# Patient Record
Sex: Female | Born: 1950 | Race: White | Hispanic: No | Marital: Married | Smoking: Never smoker
Health system: Southern US, Community
[De-identification: ages and names within clinical notes are randomized; demographics above are authoritative.]

## PROBLEM LIST (undated history)

## (undated) ENCOUNTER — Emergency Department: Payer: BLUE CROSS/BLUE SHIELD | Source: Home / Self Care

## (undated) DIAGNOSIS — R42 Dizziness and giddiness: Secondary | ICD-10-CM

## (undated) DIAGNOSIS — I700 Atherosclerosis of aorta: Secondary | ICD-10-CM

## (undated) DIAGNOSIS — G4700 Insomnia, unspecified: Secondary | ICD-10-CM

## (undated) DIAGNOSIS — M1990 Unspecified osteoarthritis, unspecified site: Secondary | ICD-10-CM

## (undated) DIAGNOSIS — C4491 Basal cell carcinoma of skin, unspecified: Secondary | ICD-10-CM

## (undated) DIAGNOSIS — M25512 Pain in left shoulder: Secondary | ICD-10-CM

## (undated) DIAGNOSIS — E119 Type 2 diabetes mellitus without complications: Secondary | ICD-10-CM

## (undated) DIAGNOSIS — G44209 Tension-type headache, unspecified, not intractable: Secondary | ICD-10-CM

## (undated) DIAGNOSIS — R0600 Dyspnea, unspecified: Secondary | ICD-10-CM

## (undated) DIAGNOSIS — M5030 Other cervical disc degeneration, unspecified cervical region: Secondary | ICD-10-CM

## (undated) DIAGNOSIS — M51369 Other intervertebral disc degeneration, lumbar region without mention of lumbar back pain or lower extremity pain: Secondary | ICD-10-CM

## (undated) DIAGNOSIS — I5030 Unspecified diastolic (congestive) heart failure: Secondary | ICD-10-CM

## (undated) DIAGNOSIS — I447 Left bundle-branch block, unspecified: Secondary | ICD-10-CM

## (undated) DIAGNOSIS — G629 Polyneuropathy, unspecified: Secondary | ICD-10-CM

## (undated) DIAGNOSIS — Z22322 Carrier or suspected carrier of Methicillin resistant Staphylococcus aureus: Secondary | ICD-10-CM

## (undated) DIAGNOSIS — R519 Headache, unspecified: Secondary | ICD-10-CM

## (undated) DIAGNOSIS — K219 Gastro-esophageal reflux disease without esophagitis: Secondary | ICD-10-CM

## (undated) DIAGNOSIS — F32A Depression, unspecified: Secondary | ICD-10-CM

## (undated) DIAGNOSIS — Z8669 Personal history of other diseases of the nervous system and sense organs: Secondary | ICD-10-CM

## (undated) DIAGNOSIS — F419 Anxiety disorder, unspecified: Secondary | ICD-10-CM

## (undated) DIAGNOSIS — J329 Chronic sinusitis, unspecified: Secondary | ICD-10-CM

## (undated) DIAGNOSIS — I272 Pulmonary hypertension, unspecified: Secondary | ICD-10-CM

## (undated) DIAGNOSIS — R011 Cardiac murmur, unspecified: Secondary | ICD-10-CM

## (undated) DIAGNOSIS — E11319 Type 2 diabetes mellitus with unspecified diabetic retinopathy without macular edema: Secondary | ICD-10-CM

## (undated) DIAGNOSIS — Z9841 Cataract extraction status, right eye: Secondary | ICD-10-CM

## (undated) DIAGNOSIS — J302 Other seasonal allergic rhinitis: Secondary | ICD-10-CM

## (undated) DIAGNOSIS — Z860100 Personal history of colon polyps, unspecified: Secondary | ICD-10-CM

## (undated) DIAGNOSIS — E1140 Type 2 diabetes mellitus with diabetic neuropathy, unspecified: Secondary | ICD-10-CM

## (undated) DIAGNOSIS — I6789 Other cerebrovascular disease: Secondary | ICD-10-CM

## (undated) DIAGNOSIS — R0609 Other forms of dyspnea: Secondary | ICD-10-CM

## (undated) DIAGNOSIS — J189 Pneumonia, unspecified organism: Secondary | ICD-10-CM

## (undated) DIAGNOSIS — F329 Major depressive disorder, single episode, unspecified: Secondary | ICD-10-CM

## (undated) DIAGNOSIS — H9193 Unspecified hearing loss, bilateral: Secondary | ICD-10-CM

## (undated) DIAGNOSIS — I10 Essential (primary) hypertension: Secondary | ICD-10-CM

## (undated) DIAGNOSIS — D649 Anemia, unspecified: Secondary | ICD-10-CM

## (undated) DIAGNOSIS — G709 Myoneural disorder, unspecified: Secondary | ICD-10-CM

## (undated) DIAGNOSIS — I2510 Atherosclerotic heart disease of native coronary artery without angina pectoris: Secondary | ICD-10-CM

## (undated) DIAGNOSIS — I350 Nonrheumatic aortic (valve) stenosis: Secondary | ICD-10-CM

## (undated) DIAGNOSIS — G3184 Mild cognitive impairment, so stated: Secondary | ICD-10-CM

## (undated) DIAGNOSIS — C4490 Unspecified malignant neoplasm of skin, unspecified: Secondary | ICD-10-CM

## (undated) DIAGNOSIS — C4492 Squamous cell carcinoma of skin, unspecified: Secondary | ICD-10-CM

## (undated) DIAGNOSIS — R002 Palpitations: Secondary | ICD-10-CM

## (undated) DIAGNOSIS — E878 Other disorders of electrolyte and fluid balance, not elsewhere classified: Secondary | ICD-10-CM

## (undated) DIAGNOSIS — Z8719 Personal history of other diseases of the digestive system: Secondary | ICD-10-CM

## (undated) DIAGNOSIS — I480 Paroxysmal atrial fibrillation: Secondary | ICD-10-CM

## (undated) HISTORY — DX: Essential (primary) hypertension: I10

## (undated) HISTORY — DX: Unspecified malignant neoplasm of skin, unspecified: C44.90

## (undated) HISTORY — DX: Type 2 diabetes mellitus without complications: E11.9

## (undated) HISTORY — PX: EYE SURGERY: SHX253

## (undated) HISTORY — PX: MANDIBLE SURGERY: SHX707

## (undated) HISTORY — DX: Anemia, unspecified: D64.9

## (undated) HISTORY — PX: CARDIAC VALVE REPLACEMENT: SHX585

## (undated) HISTORY — DX: Gastro-esophageal reflux disease without esophagitis: K21.9

## (undated) HISTORY — PX: FOOT SURGERY: SHX648

## (undated) HISTORY — PX: CARDIAC CATHETERIZATION: SHX172

## (undated) HISTORY — DX: Personal history of colon polyps, unspecified: Z86.0100

## (undated) HISTORY — DX: Personal history of colonic polyps: Z86.010

## (undated) HISTORY — PX: COSMETIC SURGERY: SHX468

## (undated) HISTORY — PX: AUGMENTATION MAMMAPLASTY: SUR837

---
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
## 1998-06-12 DIAGNOSIS — E782 Mixed hyperlipidemia: Secondary | ICD-10-CM | POA: Insufficient documentation

## 1998-06-12 DIAGNOSIS — A6000 Herpesviral infection of urogenital system, unspecified: Secondary | ICD-10-CM | POA: Insufficient documentation

## 1998-06-12 DIAGNOSIS — I10 Essential (primary) hypertension: Secondary | ICD-10-CM | POA: Insufficient documentation

## 2003-06-13 DIAGNOSIS — K2270 Barrett's esophagus without dysplasia: Secondary | ICD-10-CM | POA: Insufficient documentation

## 2004-05-20 ENCOUNTER — Ambulatory Visit: Payer: Self-pay | Admitting: Unknown Physician Specialty

## 2004-06-29 ENCOUNTER — Ambulatory Visit: Payer: Self-pay | Admitting: Otolaryngology

## 2005-01-08 ENCOUNTER — Observation Stay: Payer: Self-pay | Admitting: Internal Medicine

## 2005-01-08 ENCOUNTER — Other Ambulatory Visit: Payer: Self-pay

## 2005-02-08 ENCOUNTER — Ambulatory Visit: Payer: Self-pay | Admitting: Family Medicine

## 2005-03-30 ENCOUNTER — Ambulatory Visit: Payer: Self-pay | Admitting: Unknown Physician Specialty

## 2005-04-25 ENCOUNTER — Ambulatory Visit: Payer: Self-pay | Admitting: Surgery

## 2005-06-12 HISTORY — PX: OTHER SURGICAL HISTORY: SHX169

## 2005-06-12 HISTORY — PX: CHOLECYSTECTOMY: SHX55

## 2006-02-28 ENCOUNTER — Ambulatory Visit: Payer: Self-pay | Admitting: Family Medicine

## 2006-04-16 ENCOUNTER — Other Ambulatory Visit: Payer: Self-pay

## 2006-04-20 ENCOUNTER — Ambulatory Visit: Payer: Self-pay | Admitting: Orthopedic Surgery

## 2006-05-08 ENCOUNTER — Encounter: Payer: Self-pay | Admitting: Orthopedic Surgery

## 2006-05-12 ENCOUNTER — Encounter: Payer: Self-pay | Admitting: Orthopedic Surgery

## 2006-06-12 DIAGNOSIS — IMO0002 Reserved for concepts with insufficient information to code with codable children: Secondary | ICD-10-CM | POA: Insufficient documentation

## 2006-06-12 DIAGNOSIS — E1165 Type 2 diabetes mellitus with hyperglycemia: Secondary | ICD-10-CM | POA: Insufficient documentation

## 2006-06-12 HISTORY — PX: OTHER SURGICAL HISTORY: SHX169

## 2007-01-03 ENCOUNTER — Other Ambulatory Visit: Payer: Self-pay

## 2007-01-03 ENCOUNTER — Ambulatory Visit: Payer: Self-pay | Admitting: Orthopedic Surgery

## 2007-01-17 ENCOUNTER — Ambulatory Visit: Payer: Self-pay | Admitting: Orthopedic Surgery

## 2007-05-22 ENCOUNTER — Ambulatory Visit: Payer: Self-pay | Admitting: Family Medicine

## 2007-07-04 ENCOUNTER — Ambulatory Visit: Payer: Self-pay | Admitting: Orthopedic Surgery

## 2007-07-11 ENCOUNTER — Ambulatory Visit: Payer: Self-pay | Admitting: Orthopedic Surgery

## 2008-01-06 ENCOUNTER — Ambulatory Visit: Payer: Self-pay | Admitting: Family Medicine

## 2008-05-14 ENCOUNTER — Ambulatory Visit: Payer: Self-pay | Admitting: Unknown Physician Specialty

## 2008-07-14 ENCOUNTER — Ambulatory Visit: Payer: Self-pay | Admitting: Otolaryngology

## 2009-06-08 ENCOUNTER — Ambulatory Visit: Payer: Self-pay | Admitting: Family Medicine

## 2010-10-11 HISTORY — PX: OTHER SURGICAL HISTORY: SHX169

## 2011-01-31 ENCOUNTER — Ambulatory Visit: Payer: Self-pay | Admitting: Unknown Physician Specialty

## 2011-02-23 ENCOUNTER — Ambulatory Visit: Payer: Self-pay | Admitting: Unknown Physician Specialty

## 2011-02-28 ENCOUNTER — Ambulatory Visit: Payer: Self-pay | Admitting: Internal Medicine

## 2011-06-21 ENCOUNTER — Ambulatory Visit: Payer: Self-pay | Admitting: Obstetrics and Gynecology

## 2012-10-26 ENCOUNTER — Ambulatory Visit: Payer: Self-pay

## 2012-11-09 ENCOUNTER — Other Ambulatory Visit: Payer: Self-pay | Admitting: Family Medicine

## 2012-11-09 LAB — CLOSTRIDIUM DIFFICILE BY PCR

## 2013-01-21 ENCOUNTER — Other Ambulatory Visit (HOSPITAL_COMMUNITY): Payer: Self-pay | Admitting: Family Medicine

## 2013-01-21 ENCOUNTER — Ambulatory Visit (HOSPITAL_COMMUNITY): Payer: 59

## 2013-01-21 ENCOUNTER — Ambulatory Visit (HOSPITAL_COMMUNITY)
Admission: RE | Admit: 2013-01-21 | Discharge: 2013-01-21 | Disposition: A | Discharge status: Home / Self Care | Payer: 59 | Source: Ambulatory Visit | Attending: Family Medicine | Admitting: Family Medicine

## 2013-01-21 DIAGNOSIS — R42 Dizziness and giddiness: Secondary | ICD-10-CM

## 2013-01-21 DIAGNOSIS — I5033 Acute on chronic diastolic (congestive) heart failure: Secondary | ICD-10-CM | POA: Insufficient documentation

## 2013-01-21 DIAGNOSIS — I5189 Other ill-defined heart diseases: Secondary | ICD-10-CM | POA: Insufficient documentation

## 2013-01-21 DIAGNOSIS — R55 Syncope and collapse: Secondary | ICD-10-CM | POA: Insufficient documentation

## 2013-01-21 DIAGNOSIS — I359 Nonrheumatic aortic valve disorder, unspecified: Secondary | ICD-10-CM

## 2013-01-21 DIAGNOSIS — I519 Heart disease, unspecified: Secondary | ICD-10-CM | POA: Insufficient documentation

## 2013-01-21 DIAGNOSIS — I350 Nonrheumatic aortic (valve) stenosis: Secondary | ICD-10-CM | POA: Insufficient documentation

## 2013-01-21 HISTORY — DX: Nonrheumatic aortic (valve) stenosis: I35.0

## 2013-01-21 NOTE — Progress Notes (Signed)
  Echocardiogram 2D Echocardiogram has been performed.  Katie Buckley 01/21/2013, 5:13 PM

## 2013-01-29 ENCOUNTER — Ambulatory Visit (HOSPITAL_COMMUNITY): Payer: 59

## 2013-03-12 HISTORY — PX: NASAL SINUS SURGERY: SHX719

## 2013-03-26 ENCOUNTER — Ambulatory Visit: Payer: Self-pay | Admitting: Otolaryngology

## 2013-03-27 ENCOUNTER — Ambulatory Visit: Payer: Self-pay | Admitting: Otolaryngology

## 2013-03-27 LAB — BASIC METABOLIC PANEL
Anion Gap: 8 (ref 7–16)
BUN: 14 mg/dL (ref 7–18)
Calcium, Total: 8.9 mg/dL (ref 8.5–10.1)
Chloride: 100 mmol/L (ref 98–107)
Creatinine: 0.73 mg/dL (ref 0.60–1.30)
EGFR (Non-African Amer.): >60
Glucose: 293 mg/dL — ABNORMAL HIGH (ref 65–99)
Potassium: 3.9 mmol/L (ref 3.5–5.1)
Sodium: 134 mmol/L — ABNORMAL LOW (ref 136–145)

## 2013-03-28 LAB — LIPID PANEL
HDL Cholesterol: 79 mg/dL — ABNORMAL HIGH (ref 40–60)
Ldl Cholesterol, Calc: 125 mg/dL — ABNORMAL HIGH (ref 0–100)
Triglycerides: 200 mg/dL (ref 0–200)

## 2013-03-31 LAB — WOUND CULTURE

## 2013-03-31 LAB — PATHOLOGY REPORT

## 2013-04-17 LAB — CULTURE, FUNGUS WITHOUT SMEAR

## 2013-04-29 ENCOUNTER — Inpatient Hospital Stay: Payer: Self-pay | Admitting: Otolaryngology

## 2013-04-29 ENCOUNTER — Ambulatory Visit: Payer: Self-pay | Admitting: Otolaryngology

## 2013-04-29 ENCOUNTER — Ambulatory Visit: Payer: Self-pay | Admitting: Family Medicine

## 2013-04-30 LAB — BASIC METABOLIC PANEL
Anion Gap: 6 — ABNORMAL LOW (ref 7–16)
Calcium, Total: 9 mg/dL (ref 8.5–10.1)
Creatinine: 0.63 mg/dL (ref 0.60–1.30)
EGFR (African American): >60
EGFR (Non-African Amer.): >60
Glucose: 250 mg/dL — ABNORMAL HIGH (ref 65–99)
Potassium: 3.9 mmol/L (ref 3.5–5.1)

## 2013-04-30 LAB — CBC WITH DIFFERENTIAL/PLATELET
Basophil #: 0 10*3/uL (ref 0.0–0.1)
Basophil %: 0.4 %
Eosinophil #: 0 10*3/uL (ref 0.0–0.7)
Eosinophil %: 0.1 %
HCT: 35.5 % (ref 35.0–47.0)
HGB: 12.2 g/dL (ref 12.0–16.0)
Lymphocyte #: 1.5 10*3/uL (ref 1.0–3.6)
MCH: 28.5 pg (ref 26.0–34.0)
Monocyte %: 2 %
Neutrophil #: 8.9 10*3/uL — ABNORMAL HIGH (ref 1.4–6.5)
Neutrophil %: 83.1 %
RBC: 4.29 10*6/uL (ref 3.80–5.20)
RDW: 13.2 % (ref 11.5–14.5)

## 2013-05-02 LAB — PATHOLOGY REPORT

## 2013-05-02 LAB — POTASSIUM: Potassium: 4.1 mmol/L (ref 3.5–5.1)

## 2013-05-02 LAB — SODIUM: Sodium: 137 mmol/L (ref 136–145)

## 2013-05-12 ENCOUNTER — Ambulatory Visit: Payer: Self-pay | Admitting: Family Medicine

## 2013-05-22 LAB — CULTURE, FUNGUS WITHOUT SMEAR

## 2013-08-20 LAB — HEPATIC FUNCTION PANEL
ALT: 31 U/L (ref 7–35)
AST: 30 U/L (ref 13–35)

## 2013-10-07 ENCOUNTER — Ambulatory Visit: Payer: Self-pay | Admitting: Otolaryngology

## 2014-02-08 ENCOUNTER — Emergency Department: Payer: Self-pay | Admitting: Internal Medicine

## 2014-02-08 LAB — HEPATIC FUNCTION PANEL A (ARMC)
Albumin: 4.1 g/dL (ref 3.4–5.0)
Alkaline Phosphatase: 102 U/L
Bilirubin, Direct: <0.1 mg/dL (ref 0.00–0.20)
Bilirubin,Total: 0.2 mg/dL (ref 0.2–1.0)
SGOT(AST): 31 U/L (ref 15–37)
SGPT (ALT): 41 U/L
Total Protein: 7.4 g/dL (ref 6.4–8.2)

## 2014-02-08 LAB — BASIC METABOLIC PANEL
ANION GAP: 8 (ref 7–16)
BUN: 13 mg/dL (ref 7–18)
CALCIUM: 9 mg/dL (ref 8.5–10.1)
CO2: 31 mmol/L (ref 21–32)
Chloride: 99 mmol/L (ref 98–107)
Creatinine: 0.87 mg/dL (ref 0.60–1.30)
EGFR (African American): >60
Glucose: 155 mg/dL — ABNORMAL HIGH (ref 65–99)
Osmolality: 279 (ref 275–301)
POTASSIUM: 3.7 mmol/L (ref 3.5–5.1)
Sodium: 138 mmol/L (ref 136–145)

## 2014-02-08 LAB — CBC
HCT: 40.7 % (ref 35.0–47.0)
HGB: 13.1 g/dL (ref 12.0–16.0)
MCH: 27.5 pg (ref 26.0–34.0)
MCHC: 32.2 g/dL (ref 32.0–36.0)
MCV: 86 fL (ref 80–100)
Platelet: 218 10*3/uL (ref 150–440)
RBC: 4.75 10*6/uL (ref 3.80–5.20)
RDW: 14 % (ref 11.5–14.5)
WBC: 8.1 10*3/uL (ref 3.6–11.0)

## 2014-02-08 LAB — TROPONIN I: Troponin-I: <0.02 ng/mL

## 2014-02-09 LAB — CLOSTRIDIUM DIFFICILE(ARMC)

## 2014-02-09 LAB — WBCS, STOOL

## 2014-02-09 LAB — OCCULT BLOOD X 1 CARD TO LAB, STOOL: Occult Blood, Feces: NEGATIVE

## 2014-02-11 LAB — STOOL CULTURE

## 2014-04-13 ENCOUNTER — Ambulatory Visit: Payer: Self-pay | Admitting: Family Medicine

## 2014-05-12 ENCOUNTER — Ambulatory Visit: Payer: Self-pay | Admitting: Family Medicine

## 2014-05-20 LAB — HM DIABETES EYE EXAM

## 2014-07-22 LAB — CBC AND DIFFERENTIAL
HEMATOCRIT: 39 % (ref 36–46)
Hemoglobin: 13 g/dL (ref 12.0–16.0)
Platelets: 194 10*3/uL (ref 150–399)
WBC: 5.7 10*3/mL

## 2014-07-22 LAB — LIPID PANEL
Cholesterol: 336 mg/dL — AB (ref 0–200)
HDL: 73 mg/dL — AB (ref 35–70)
LDL Cholesterol: 210 mg/dL
Triglycerides: 267 mg/dL — AB (ref 40–160)

## 2014-07-22 LAB — BASIC METABOLIC PANEL
BUN: 13 mg/dL (ref 4–21)
CREATININE: 0.6 mg/dL (ref 0.5–1.1)
GLUCOSE: 152 mg/dL
POTASSIUM: 3.9 mmol/L (ref 3.4–5.3)
Sodium: 140 mmol/L (ref 137–147)

## 2014-07-22 LAB — TSH: TSH: 1.56 u[IU]/mL (ref 0.41–5.90)

## 2014-07-22 LAB — HEMOGLOBIN A1C: HEMOGLOBIN A1C: 8.1 % — AB (ref 4.0–6.0)

## 2014-10-02 NOTE — Discharge Summary (Signed)
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  OPERATIONS: On 05/01/2013, revision endoscopic sinus surgery.   [place] COURSE: The patient was admitted with severe headaches and an outpatient CT scan that showed opacification of the left frontal sinus and mucoperiosteal thickening involving the other paranasal sinuses. She was immediately started on IV Decadron and IV Unasyn. Consultation from the [facility] service was obtained, and they were very helpful with control of her diabetes as well as other medical issues while she was on the IV Decadron. The patient improved over the next 2 days very slowly and was taken to the operating room on November 20. She responded very well to that, and was ready for discharge and resumption of outpatient oral antibiotics and oral steroids. She will have followup in Dr. Ainsley Spinner clinic in 10 days, sooner if necessary.    ____________________________ Lenna Sciara. Nadeen Landau, MD jmc:lb D: 05/02/2013 07:55:32 ET T: 05/02/2013 08:13:34 ET JOB#: [id number]  cc: Janalee Dane, MD, <Dictator> [place] MD ELECTRONICALLY SIGNED 05/06/2013 10:58

## 2014-10-02 NOTE — H&P (Signed)
PATIENT NAME:  Katie Buckley, Katie Buckley MR#:  [id number] DATE OF BIRTH:  Oct 18, 1950  DATE OF ADMISSION:  04/29/2013  ADMITTING PHYSICIAN:  Nadeen Landau, MD  HISTORY OF PRESENT ILLNESS: The patient is a 64 year old white female who had endoscopic sinus surgery for severe acute sinusitis a month ago. She was noted, at the time, to have significant purulence, especially on left-sided purulence and pathologically had positive eosinophils. She did well for about 2 weeks, but over the past 2 weeks has developed worsening headaches, fatigue and is experiencing diarrhea from the antibiotics. Notably, she has a history of a nasal dorsal implant. This was not removed at the time of the surgery 1 month ago, although it was considered. The patient denies visual changes or double vision. She had a CT scan today which shows an opacification of the left frontal sinus. She developed low-grade fever starting this afternoon; therefore, I elected to admit the patient for IV antibiotics and IV steroids. She has failed oral antibiotics including oral ciprofloxacin and Septra DS.   ALLERGIES: BIAXIN (BAD TASTE IN THE MOUTH AND GI DISTRESS).   PAST SURGICAL HISTORY: Nasal surgery Baldo [place]).  Endoscopic sinus surgery on 03/29/2013.   PAST MEDICAL HISTORY:  Chronic sinusitis, skin cancer, gastroesophageal reflux disease, coronary artery disease, hypertension, diabetes.   MEDICATIONS:  Singulair, Allegra, Septra DS, Flonase, Diovan.   PHYSICAL EXAMINATION: GENERAL: The patient is in no acute distress.  EYES: Pupils are equal, round and reactive to light. Extraocular movements are intact.  NOSE: Dorsal implant intact. No erythema or pain with movement of it. No crusting or purulence intranasally. Oral cavity and oropharynx clear.  NECK: Trachea is in the midline.  EARS: External auditory canals are clear. Tympanic membranes are normal.  LUNGS: Clear to auscultation.  HEART: Regular rate and rhythm.  IMPRESSION:  1.  Acute  exacerbation of chronic pansinusitis. I have recommended IV antibiotics and IV steroids if the patient is not improved clinically by tomorrow, then we will proceed with revision frontal sinusotomy.  I have discussed all this in detail with the patient and her husband. She will be n.p.o. after midnight tonight.  2.  Diabetes and medical management I greatly appreciate the help of Dr. Verdell Carmine with her medical management.     ____________________________ Lenna Sciara. Nadeen Landau, MD jmc:dmm D: 04/29/2013 20:59:48 ET T: 04/29/2013 21:32:01 ET JOB#: [id number]  cc: Janalee Dane, MD, <Dictator> [place] MD ELECTRONICALLY SIGNED 05/01/2013 10:52

## 2014-10-02 NOTE — Consult Note (Signed)
Brief Consult Note: Diagnosis: 1. Acute on Chronic Sinusitis 2. HTN 3. DM 4. GERD 5. Barret's Esophogus 6. Chronic anemia.   Patient was seen by consultant.   Consult note dictated.   Recommend to proceed with surgery or procedure.   Orders entered.   Discussed with Attending MD.   Comments: 63 yo female w/ hx of HTN, DM, Obesity, GERD, Chronic anemia, hx of Barret's Esophagus, recent sinus surgery for acute on chronic sinusitis comes in w/ facial pressure, pain and having failed outpatient therapy for acute sinusitis w/ abx/steriods. Pt. to get sinus surgery tomorrow a.m.   1. Acute on Chronic sinusitis - failed outpatient therapy w/ steroids, abx.  - cont. care as per ENT. going to OR tomorrow.   2. DM - cont. Metformin, Glipizide.  - will add SSI coverage.  If sugars still labile will add some low dose Levemir.   3. HTN - cont. Metoprolol, Diovan, HCTZ  4. Hyperlipidemia - cont. Lipitor.   5. GERD - cont. Nexium.   6. Depression - cont. Brintellix.   Thanks for the consult and will follow with you.  Job # [id number].  Electronic Signatures: Henreitta Leber (MD)  (Signed [phone] 18:02)  Authored: Brief Consult Note   Last Updated: 18-Nov-14 18:02 by Henreitta Leber (MD)

## 2014-10-02 NOTE — Discharge Summary (Signed)
PATIENT NAME:  Katie Buckley, Katie Buckley MR#:  [id number] DATE OF BIRTH:  1950/12/28  REASON FOR ADMISSION: Urgent sinus surgery.   OPERATIONS AND INTERVENTIONS: Sinus surgery on 03/27/2013.   [place] COURSE: The patient was admitted for emergent sinus surgery (progression of sinusitis to periorbital cellulitis). The patient underwent endoscopic sinus surgery without complication and was admitted for control of diabetes and IV antibiotics.   The patient did well overnight. Her blood sugars were controlled with the help of the [facility] service. The following morning, she was without any periorbital inflammation or erythema. She completed 24 hours of IV Unasyn and was deemed ready for discharge on oral antibiotics.   FOLLOWUP: The patient was scheduled for follow-up in Dr. Ains[facility] Spinner clinic on Wednesday for debridement. Her oral antibiotic (Augmentin) was prescribed to the [place].    ____________________________ Lenna Sciara. Nadeen Landau, MD jmc:np D: 03/28/2013 16:58:00 ET T: 03/28/2013 17:21:28 ET JOB#: [id number]  cc: Janalee Dane, MD, <Dictator> Nicholos Johns MD ELECTRONICALLY SIGNED 04/10/2013 20:38

## 2014-10-02 NOTE — Consult Note (Signed)
PATIENT NAME:  Katie Buckley, Katie Buckley MR#:  [id number] DATE OF BIRTH:  06/26/1950  DATE OF CONSULTATION:  04/29/2013  REFERRING PHYSICIAN: Nadeen Landau, M.D.  CONSULTING PHYSICIAN:  Belia Heman. Verdell Carmine, MD PRIMARY CARE PHYSICIAN: Lelon Huh, M.D.   REASON FOR CONSULTATION: Medical management and management of diabetes.   HISTORY OF PRESENT ILLNESS: This is a 64 year old female who presents to the hospital due to fascial pressure and sinus pressure along with some blurry vision. The patient has a history of sinusitis and has had sinus surgery about a month ago. She is having a bout of acute-on-chronic sinusitis which is not improving with oral antibiotics and a prednisone taper. She is being admitted to the ENT service for possible recurrent sinusitis and having sinus surgery tomorrow. The patient has been placed on high-dose steroids along with IV antibiotics. Hospitalist services were contacted for diabetes management and also for management of her chronic medical problems.   The patient presently just complains of some facial pressure and some sinus pain but no postnasal drip. She says her eyes feel heavy no matter how much rest she gets. She denies any chest pain, shortness of breath, any nausea, vomiting, abdominal pain, fevers, chills, or any other associated symptoms presently.   REVIEW OF SYSTEMS:  CONSTITUTIONAL: No documented fever. No weight gain or weight loss.  EYES: No blurred or double vision.  ENT: No tinnitus. No postnasal drip. No redness of the oropharynx.  RESPIRATORY: No cough, no wheeze, no hemoptysis, no dyspnea.  CARDIOVASCULAR: No chest pain, no orthopnea, no palpitations, no syncope.  GASTROINTESTINAL: No nausea, no vomiting, or diarrhea. No abdominal pain. No melena or hematochezia.  GENITOURINARY: No dysuria and hematuria.  ENDOCRINE: No polyuria or nocturia, heat or cold intolerance.  HEMATOLOGIC: No anemia, no bruising, no bleeding.  INTEGUMENTARY: No rashes or  lesions.  MUSCULOSKELETAL: No arthritis, no swelling, no gout.  NEUROLOGIC: No numbness or tingling. No ataxia. No seizure activity.  PSYCHIATRIC: The patient has insomnia.   PAST MEDICAL HISTORY: Consistent with diabetes, hypertension, hyperlipidemia, anxiety, insomnia, GERD, Barrett esophagus.   PAST SURGICAL HISTORY: Consistent with jaw surgery, carpal tunnel release surgery, breast implants, laparoscopic cholecystectomy.   SOCIAL HISTORY: No smoking. No alcohol abuse. No illicit drug abuse. Lives at home with her husband.   FAMILY HISTORY: The patient's father is deceased, died from lung cancer. Her mother has diabetes, is alive.   CURRENT MEDICATIONS: As follows: Nexium 40 mg b.i.d., Prempro 0.625/2.5 mg daily, Nuvigil 250 mg daily, Brintellix 20 mg daily, metoprolol hydrochlorothiazide 100/25, half a tab b.i.d., Diovan hydrochlorothiazide 325/25 one tab daily, glipizide 5 mg daily, metformin 1000 mg b.i.d., Lipitor 40 mg daily, Klonopin 1 mg b.i.d. as needed and Ambien 5 mg at bedtime as needed.   PHYSICAL EXAMINATION: Presently is as follows:  VITAL SIGNS: Temperature 98.4, pulse 116, respirations 18, blood pressure 137/77, sats 96% on room air.  GENERAL: A pleasant-appearing female in no apparent distress.  HEENT: Atraumatic, normocephalic. Extraocular muscles are intact. Pupils are equal and reactive eye to light. Sclerae anicteric. No conjunctival injection. No pharyngeal erythema.  NECK: Supple. There is no jugular venous distention. No bruits, no lymphadenopathy, no thyromegaly.  HEART: Regular rate and rhythm. She does have a 2/6 systolic ejection murmur heard at the right sternal border. No rubs, no clicks.  LUNGS: Clear to auscultation bilaterally. No rales, no rhonchi, no wheezes.  ABDOMEN: Soft, flat, nontender, nondistended. Has good bowel sounds. No hepatosplenomegaly appreciated.  EXTREMITIES: No evidence of any cyanosis, clubbing,  or peripheral edema. Has +2 pedal and  radial pulses bilaterally.  NEUROLOGIC: Alert, awake, and oriented x3 with no focal motor or sensory deficits appreciated bilaterally.  SKIN: Moist and warm with no rashes appreciated.  LYMPHATIC: There is no cervical lymphadenopathy.   LABORATORY, DIAGNOSTIC AND RADIOLOGIC DATA: Currently pending.   ASSESSMENT AND PLAN: This is a 64 year old female with a history of hypertension, diabetes, obesity, gastroesophageal reflux disease, chronic anemia, history of Barrett esophagus, recent sinus surgery for acute-on-chronic sinusitis, who presents to the hospital due to facial pressure and pain, also having some heaviness in her eyes and having failed outpatient therapy for acute sinusitis with antibiotics and steroids.   1.  Acute-on-chronic sinusitis. The patient has failed outpatient therapy with oral steroids and antibiotics. She has been admitted and started on IV Decadron, IV Unasyn. The patient is likely to go to the operating room tomorrow as per Dr. Nadeen Landau. Continue care as per ENT.   2.  Diabetes. The patient is going to be on high-dose steroids; therefore, I will continue her metformin and glipizide. I will add sliding-scale insulin coverage, follow her blood sugars. If her sugars are still quite labile despite on these medications, I will consider adding some low-dose Levemir.   3.  Hypertension. I will continue the patient's metoprolol, Diovan, hydrochlorothiazide.   4.  Hyperlipidemia. Continue Lipitor.   5.  Gastroesophageal reflux disease. Continue Nexium.   6.  Depression. Continue Brintellix.   Thank you so much for the consultation. Will follow along with you.   TIME SPENT ON THE CONSULT: 45 minutes.    ____________________________ Belia Heman. Verdell Carmine, MD vjs:np D: 04/29/2013 18:02:00 ET T: 04/29/2013 18:32:13 ET JOB#: [id number]  cc: Belia Heman. Verdell Carmine, MD, <Dictator> Henreitta Leber MD ELECTRONICALLY SIGNED 04/30/2013 13:49

## 2014-10-02 NOTE — Op Note (Signed)
PATIENT NAME:  BEV, DRENNEN MR#:  [id number] DATE OF BIRTH:  February 03, 1951  DATE OF PROCEDURE:  05/01/2013  PREOPERATIVE DIAGNOSIS: Chronic pansinusitis with left frontal opacification.   POSTOPERATIVE DIAGNOSIS: Left frontal opacification; partial stenosis of right maxillary, right frontal, right sphenoid sinuses.   PROCEDURES: 1.  Image-guided sinus surgery (Stryker navigation).  2.  Revision bilateral frontal sinusotomies with tissue removal.  3.  Revision right sphenoidectomy with tissue removal.  4.  Revision right maxillary sinus antrostomy with tissue removal.  5.  Balloon dilation bilateral frontal sinuses.  6.  Rigid bronchoscopy.   SURGEON: Janalee Dane, MD   DESCRIPTION OF PROCEDURE: The patient was identified in the holding area, brought back to the operating room and placed in the supine position on the operating room table. After general endotracheal anesthesia had been induced, the patient was turned 90 degrees counterclockwise from anesthesia and placed in a beach chair position. The nose was anesthetized with a left infraorbital nerve block and phenylephrine-lidocaine soaked pledgets, 2 on each side.   The image-guided sinus surgery system mask was attached and registration was carried out in the standard fashion using the appropriate fiduciary points. Calibration of the system was confirmed and extensive review of the CT scan in all three dimensions preoperatively and intraoperatively was carried out.  Each instrument was registered and confirmed for anatomic accuracy.  The left side was addressed first. The left middle turbinate had become scarred at its anterior superior tip. This was divided and the middle turbinate was further medialized. There were a couple of small ledges of ethmoid, which were taken down with through-cutting forceps. The ethmoid, maxillary, and sphenoid sinuses were copiously irrigated. A 45 degree endoscope was then used to examine the frontal  recess and using a probe, the frontal recess was meticulously opened. There was a large volume of mucopus that was suctioned partially and sampled with a micro swab and then suctioned completely. There was an additional cell more lateral to the initial cell. This was very difficult due to definitively identify even with the image guidance. Therefore, the Balloon Sinuplasty instrumentation was used, and the fiberoptic light was passed through a small opening into the frontal sinus. This was progressively enlarged using the balloon dilation technique and then progressively enlarged using frontal sinus instrumentation. Once this had been adequately and meticulously opened, attention was directed to the right side, where again the middle turbinate was medialized. It was primarily lateralized posteriorly. This was of course divided, and the posterior aspect of the maxillary sinus antrostomy had become contracted. This was opened maximally, and attention was directed to the sphenoid sinus. This had also become very contracted, and this was enlarged using Kerrison rongeurs. The maxillary, ethmoid, and sphenoid sinuses were then copiously irrigated. The frontal sinus, which appeared to be more open superiorly than inferiorly, was balloon dilated and actually, the inferior more contracted opening was the main drainage pathway to the frontal recess. This was balloon dilated, and then the intersinus septum was taken down with through-cutting forceps on the right side. The frontal sinuses were then again copiously irrigated. An Intersect Propel drug-eluting stent was placed on each side to help medialize the middle turbinate. 0.5 unit of Surgiflo was placed. At this point, the endotracheal tube was removed, and a pediatric bronchoscope was used because the anesthesiologist had had a difficult time passing the endotracheal tube past the larynx and felt like there was subglottic narrowing. The pediatric bronchoscope was used to  visualize the subglottis, and  there was a small wedge of fresh scar tissue in the posterior subglottis. The rest of the trachea down to the carina was clear. No other mucosal abnormalities. Based on this finding, serologies for Wegener's granulomatosis and sarcoidosis were sent. The pediatric bronchoscope was removed and replaced with an LMA. The patient was then returned to anesthesia, allowed to emerge from anesthesia in the operating room, and taken to the recovery room in stable condition. There were no complications.   ESTIMATED BLOOD LOSS: 15 mL.   ____________________________ J. Nadeen Landau, MD jmc:jcm D: 05/01/2013 18:42:48 ET T: 05/01/2013 23:26:56 ET JOB#: [id number]  cc: Janalee Dane, MD, <Dictator> Nicholos Johns MD ELECTRONICALLY SIGNED 05/06/2013 10:58

## 2014-10-02 NOTE — Op Note (Signed)
PATIENT NAME:  Katie Buckley, Katie Buckley MR#:  [id number] DATE OF BIRTH:  1951-01-11  DATE OF PROCEDURE:  03/27/2013  SURGEON:  Janalee Dane, M.D.   PREOPERATIVE DIAGNOSIS:  Chronic sinusitis with progression to orbital cellulitis and unrelenting headache despite oral antibiotics and oral steroids.   POSTOPERATIVE DIAGNOSIS:  Chronic sinusitis with progression to orbital cellulitis and unrelenting headache despite oral antibiotics and oral steroids.   PROCEDURES: 1.  Image-guided sinus surgery (Stryker navigation system).  2.  Bilateral frontal sinusotomies with tissue removal.  3.  Bilateral anterior and posterior ethmoidectomies with tissue removal.  4.  Bilateral sphenoidectomies with tissue removal.  5.  Bilateral maxillary sinus antrostomies with tissue removal.   ANESTHESIA: General endotracheal.   DESCRIPTION OF PROCEDURE:  The image-guided sinus surgery system mask was attached and registration was carried out in the standard fashion using the appropriate fiduciary points. Calibration of the system was confirmed and extensive review of the CT scan in all three dimensions preoperatively and intraoperatively was carried out.  Each instrument was registered and confirmed for anatomic accuracy.  FINDINGS: The frontal sinus on left was significantly infected with purulent mucopus suctioned from the frontal recess. The ethmoid on the left posteriorly, there was a cell corresponding to the opacification posteriorly on the left that there was pus under pressure, which was relieved with opening the cell. There was also significant purulence on the right side, primarily in the ethmoid. There was profuse mucosal edema and thickening throughout the paranasal sinuses bilaterally. The concha bullosa was taken down from laterally bilaterally, and the medial aspect of the middle turbinates were medialized.   ESTIMATED BLOOD LOSS:  25 mL.   DESCRIPTION OF PROCEDURE:  The image-guided sinus surgery system  mask was attached and registration was carried out in the standard fashion using the appropriate fiduciary points. Calibration of the system was confirmed and extensive review preoperatively and intraoperatively was carried out.   Attention was directed to the sinus surgery portion of the surgery. The left side was addressed first. The middle turbinate was preserved. The uncinate process was then identified and completely resected revealing the natural maxillary sinus ostium. The natural maxillary sinus ostium was progressively enlarged by removing tissue to create a large maxillary antrostomy, removing diseased tissue. The maxillary antrum was then irrigated copiously with saline. Attention was directed to the ethmoid sinuses where bone and mucosal tissue from the ethmoid sinus was taken down from anterior to posterior preserving the skull base and lamina papyracea, removing diseased tissue. After the ethmoid sinuses had been completely cleaned out of polypoid chronically inflamed mucosa preserving the mucosa on the medial side of the middle turbinate along the skull base and along the lamina papyracea, attention was directed to the sphenoid recess. The sphenoid sinus ostium was identified and progressively enlarged with Kerrison Rongeur, staying inferior and medial in orientation, to create a large sphenoid sinus antrostomy, removing diseased tissue. The 30 degree scope was then used to explore the frontal recess. The frontal recess was progressively enlarged meticulously, removing diseased tissue, and the frontal sinus was then copiously irrigated with saline as was the sphenoid sinus. Attention was then directed to the right side where an identical series of procedures was accomplished. Upon completion of the sinus surgery, the Surgiflo was placed. A total of 1 units of Surgiflo was used.  Temporary Telfa pledgets were placed and tied over the columella to prevent dislodging.   The patient was then  returned to anesthesia, allowed to emerge from anesthesia  in the operating room, extubated, and taken to the recovery room in stable condition.    ____________________________ Lenna Sciara. Nadeen Landau, MD jmc:dmm D: 03/27/2013 19:04:01 ET T: 03/27/2013 22:49:39 ET JOB#: [id number]  cc: Janalee Dane, MD, <Dictator> Nicholos Johns MD ELECTRONICALLY SIGNED 04/10/2013 20:38

## 2014-10-02 NOTE — Consult Note (Signed)
PATIENT NAME:  Katie Buckley, Katie Buckley MR#:  [id number] DATE OF BIRTH:  1950/12/09  DATE OF CONSULTATION:  03/27/2013  REFERRING PHYSICIAN:  Nadeen Landau, MD CONSULTING PHYSICIAN:  Vivien Presto, MD  PRIMARY CARE PHYSICIAN: Kirstie Peri. Caryn Section, MD  REASON FOR CONSULTATION: Diabetes and medical management.   HISTORY OF PRESENT ILLNESS: The patient is a pleasant 64 year old Caucasian female with history of chronic sinusitis, hypertension, diabetes, hyperlipidemia, obesity, GERD. She has been having issues with her sinuses. The patient has had a couple of sinus surgeries in the past and is under the care of Dr. Nadeen Landau as an outpatient for sinus infection. She has had 14 days of Septra and also some cefdinir without improvement. The patient was sent to our CT scan, which was done yesterday showing sinusitis.   She was taken to the OR today for sinus surgery and did have a significant amount of pus drained from her sinuses. The patient also has a dorsal nasal implant, which was not touched during the surgery. The patient has been having low-grade fevers. The highest was 99.7 as an outpatient. Currently, she is in her bed. Medicine was consulted for help with medical management and help with her diabetes, as sugars have been running high. She states that her last hemoglobin A1c might have been in the 11s, but she is not sure.   PAST MEDICAL HISTORY: Diabetes, obesity, anemia, GERD, Barrett's esophagus, hypertension, heart murmur.   PAST SURGICAL HISTORY: Two nose surgeries, jaw surgery, carpal tunnel release surgery, breast implants multiple years ago, gallbladder surgery.   SOCIAL HISTORY: No tobacco, alcohol or drug use.   FAMILY HISTORY: Hypertension and diabetes run in her family.   OUTPATIENT MEDICATIONS: She does not know most of her dosages, but she knows she is on Nexium 40 mg 2 times a day, Prempro unknown dose, glipizide unknown dose, metformin 1000 mg 2 times a day, Lopressor unknown  dose, Ambien unknown dose, Diovan unknown dose, Singulair unknown dose. She is on Septra as well, Allegra and some prednisone.   ALLERGIES: BIAXIN CAUSING SOME GI UPSET.   REVIEW OF SYSTEMS:  CONSTITUTIONAL: Positive for low-grade fevers and 4-pound weight loss since her symptoms started. No headaches.  EYES: No blurry vision.  ENT: The patient had sinus pain before surgery drainage and some postnasal drip. The patient has a history of nasal surgeries and sinusitis chronically in the past. Currently, she has some pain in the sinuses.  CARDIOVASCULAR: She denies having any pains in the chest or palpitations. She has hypertension and heart murmur.  RESPIRATORY: The patient has occasional productive cough, yellowish sputum. No shortness of breath or wheezing.  ABDOMEN: The patient has had no nausea or vomiting, no abdominal pain, but has had somewhat loose stools since initiation of her antibiotics. She had 2 bowel movements yesterday, watery, brownish. No loose stools today. No bloody stools.  GENITOURINARY: Denies dysuria, hematuria.  HEMATOLOGIC, LYMPHATIC: The patient has a history of anemia. No easy bruising.  SKIN: No rashes.  MUSCULOSKELETAL: Denies arthritis or gout.  NEUROLOGIC: No focal weakness or numbness.  PSYCHIATRIC: The patient has insomnia.   PHYSICAL EXAMINATION:  VITAL SIGNS: Temperature on arrival 97.5 postsurgery, pulse 91, respiratory rate 18, blood pressure 134/70, O2 saturation 93% on 2 liters.  GENERAL: An obese female lying in bed, in no obvious distress.  HEENT: Normocephalic, atraumatic. The patient does have nasal packing and bandages on.  Pupils are equal, somewhat dry mucous membranes.  NECK: Supple. No thyroid tenderness. No cervical lymphadenopathy.  CARDIOVASCULAR: S1, S2 regularly irregular. The patient does have a systolic ejection murmur, 3/6. No rubs or gallops.  LUNGS: Clear to auscultation. No wheezing, rhonchi or rales.  ABDOMEN: Soft, nontender,  nondistended. Positive bowel sounds in all quadrants.  EXTREMITIES: No significant lower extremity edema.  SKIN: No obvious rashes.  NEUROLOGIC: Cranial nerves appear to be intact, II to XII. Strength is 5/5, all extremities. Sensation is intact to light touch.  PSYCHIATRIC: Awake, alert, oriented.   LABORATORY DATA: The patient has a CT of the sinuses without contrast from October 15 showing sinusitis. The patient's blood sugars today - initially 160, last one 260, but no other labs.   ASSESSMENT AND PLAN: We have a 64 year old female with chronic sinusitis, history of nasal surgery in the past, obesity, gastroesophageal reflux disease, Barrett's esophagus, diabetes, hypertension, post sinus surgery currently for chronic sinusitis, not improved on a couple rounds of antibiotics, and medicine was consulted for help with medical management.   At this point, we would go ahead and get her full medication reconciliation as she does not know most of her medications. Will attempt to call her pharmacy and figure out the dosages.   In regard to her diabetes, will check a hemoglobin A1c, start her on sliding scale insulin, start a low-dose glipizide for now. If her hemoglobin A1c is high, I would consider starting her on insulin, as I believe if she is going to be on prednisone and steroids here, that is going to jack up her sugars and if they are already high, they would not likely respond to orals.   In regard to her blood pressure medications, I would start some Lopressor, hold Diovan for now and see what the GFR is and consider resuming it later today. Her blood pressure is acceptable currently. I will check a lipid profile, as she is diabetic and hypertensive. She thinks she might be on a statin, but she is not sure. She should be on a statin. I would continue her PPI b.i.d. for her Barrett's esophagus and GERD. She is currently on Unasyn per ENT for her sinusitis, as well as some bacitracin ointment. She  is also on Nasacort. Management of sinusitis per ENT.   She has received 4 units of NovoLog already for her diabetes. Her pain appears to be somewhat controlled now, but she is already on some Norco as well as morphine. Will see how she does.   Thank you for this kind consult and we will follow along with you.  TOTAL TIME SPENT: 50 minutes.   The case was discussed with the patient's husband, the patient, as well as Dr. Carlis Abbott.    ____________________________ Vivien Presto, MD sa:np D: 03/27/2013 18:06:44 ET T: 03/27/2013 19:29:38 ET JOB#: [id number]  cc: Vivien Presto, MD, <Dictator> Kirstie Peri. Caryn Section, MD   Vivien Presto MD ELECTRONICALLY SIGNED 04/11/2013 14:53

## 2014-11-16 ENCOUNTER — Other Ambulatory Visit: Payer: Self-pay | Admitting: *Deleted

## 2014-11-17 MED ORDER — VALSARTAN-HYDROCHLOROTHIAZIDE 320-12.5 MG PO TABS
1.0000 | ORAL_TABLET | Freq: Every day | ORAL | Status: DC
Start: 2014-11-17 — End: 2014-11-18

## 2014-11-17 MED ORDER — ATORVASTATIN CALCIUM 40 MG PO TABS: 40.0000 mg | ORAL_TABLET | Freq: Every day | ORAL | Status: DC

## 2014-11-17 NOTE — Telephone Encounter (Signed)
Refill request for Lipitor 40mg  Last filled by MD on- 06/22/2014 #90 x0 Last Appt: 07/22/2014 Next Appt: none  Refill request for Valsartian-Hctz 320-25 mg Last filled by MD on- 03/26/2014 Please advise refill?

## 2014-11-18 ENCOUNTER — Other Ambulatory Visit: Payer: Self-pay | Admitting: Family Medicine

## 2014-11-18 MED ORDER — VALSARTAN-HYDROCHLOROTHIAZIDE 320-12.5 MG PO TABS: 1.0000 | ORAL_TABLET | Freq: Every day | ORAL | Status: DC

## 2014-11-18 MED ORDER — ATORVASTATIN CALCIUM 40 MG PO TABS: 40.0000 mg | ORAL_TABLET | Freq: Every day | ORAL | Status: DC

## 2014-12-16 ENCOUNTER — Other Ambulatory Visit: Payer: Self-pay | Admitting: *Deleted

## 2014-12-16 NOTE — Telephone Encounter (Signed)
Refill request for But-Ace-Caff 50-325-40 mg Last filled by MD on- 07/13/2014 #30 x3 Last Appt: 07/22/2014 Next Appt: none Please advise refill?

## 2014-12-17 MED ORDER — BUTALBITAL-APAP-CAFFEINE 50-325-40 MG PO TABS: 1.0000 | ORAL_TABLET | Freq: Four times a day (QID) | ORAL | Status: DC | PRN

## 2014-12-17 NOTE — Telephone Encounter (Signed)
Please call in

## 2014-12-17 NOTE — Telephone Encounter (Signed)
Prescription called in to pharmacy

## 2014-12-21 ENCOUNTER — Telehealth: Payer: Self-pay | Admitting: Family Medicine

## 2014-12-21 NOTE — Telephone Encounter (Signed)
Left message on pt.'s vm.

## 2014-12-21 NOTE — Telephone Encounter (Signed)
Those supplements are fine, they should not interfere with any of her medications.

## 2014-12-21 NOTE — Telephone Encounter (Signed)
Pt stated her trainer suggested she start taking supplements and wanted to check with Dr. Caryn Section to see if they were ok to take with the meds that pt is currently taking. Supplements: CLA, L-Carnitine, & Raspberry Tetones. Thanks TNP

## 2015-02-12 ENCOUNTER — Other Ambulatory Visit: Payer: Self-pay | Admitting: Family Medicine

## 2015-02-12 MED ORDER — BUTALBITAL-APAP-CAFFEINE 50-325-40 MG PO TABS: 1.0000 | ORAL_TABLET | Freq: Four times a day (QID) | ORAL | Status: DC | PRN

## 2015-02-12 MED ORDER — METOPROLOL-HYDROCHLOROTHIAZIDE 100-25 MG PO TABS: 0.5000 | ORAL_TABLET | Freq: Two times a day (BID) | ORAL | Status: DC

## 2015-02-12 MED ORDER — GLIPIZIDE 10 MG PO TABS: 10.0000 mg | ORAL_TABLET | Freq: Every day | ORAL | Status: DC

## 2015-02-12 NOTE — Progress Notes (Signed)
Please call in butalbital.  

## 2015-02-16 NOTE — Progress Notes (Signed)
Rx called in to pharmacy. 

## 2015-03-12 ENCOUNTER — Ambulatory Visit: Payer: Self-pay | Admitting: Family Medicine

## 2015-03-12 ENCOUNTER — Other Ambulatory Visit: Payer: Self-pay | Admitting: Family Medicine

## 2015-04-01 ENCOUNTER — Other Ambulatory Visit: Payer: Self-pay | Admitting: *Deleted

## 2015-04-07 ENCOUNTER — Other Ambulatory Visit: Payer: Self-pay | Admitting: *Deleted

## 2015-04-07 NOTE — Telephone Encounter (Signed)
Rx request already sent to provider.

## 2015-04-12 ENCOUNTER — Other Ambulatory Visit: Payer: Self-pay | Admitting: *Deleted

## 2015-04-12 MED ORDER — GLIPIZIDE 10 MG PO TABS: 10.0000 mg | ORAL_TABLET | Freq: Every day | ORAL | Status: DC

## 2015-04-12 MED ORDER — ESOMEPRAZOLE MAGNESIUM 40 MG PO CPDR: 40.0000 mg | DELAYED_RELEASE_CAPSULE | Freq: Every day | ORAL | Status: DC

## 2015-04-12 NOTE — Telephone Encounter (Signed)
Left vm letting pt know that she is due for ov.

## 2015-04-12 NOTE — Telephone Encounter (Signed)
Left message requesting pt schedule an ov appt.

## 2015-04-15 ENCOUNTER — Other Ambulatory Visit: Payer: Self-pay | Admitting: Family Medicine

## 2015-04-15 DIAGNOSIS — Z1231 Encounter for screening mammogram for malignant neoplasm of breast: Secondary | ICD-10-CM

## 2015-04-21 DIAGNOSIS — F329 Major depressive disorder, single episode, unspecified: Secondary | ICD-10-CM | POA: Insufficient documentation

## 2015-04-21 DIAGNOSIS — E538 Deficiency of other specified B group vitamins: Secondary | ICD-10-CM | POA: Insufficient documentation

## 2015-04-21 DIAGNOSIS — K219 Gastro-esophageal reflux disease without esophagitis: Secondary | ICD-10-CM

## 2015-04-21 DIAGNOSIS — J329 Chronic sinusitis, unspecified: Secondary | ICD-10-CM | POA: Insufficient documentation

## 2015-04-21 DIAGNOSIS — O039 Complete or unspecified spontaneous abortion without complication: Secondary | ICD-10-CM | POA: Insufficient documentation

## 2015-04-21 DIAGNOSIS — D649 Anemia, unspecified: Secondary | ICD-10-CM | POA: Insufficient documentation

## 2015-04-21 DIAGNOSIS — N951 Menopausal and female climacteric states: Secondary | ICD-10-CM | POA: Insufficient documentation

## 2015-04-21 DIAGNOSIS — R519 Headache, unspecified: Secondary | ICD-10-CM | POA: Insufficient documentation

## 2015-04-21 DIAGNOSIS — J45909 Unspecified asthma, uncomplicated: Secondary | ICD-10-CM | POA: Insufficient documentation

## 2015-04-21 DIAGNOSIS — E539 Vitamin B deficiency, unspecified: Secondary | ICD-10-CM | POA: Insufficient documentation

## 2015-04-21 DIAGNOSIS — E669 Obesity, unspecified: Secondary | ICD-10-CM | POA: Insufficient documentation

## 2015-04-21 DIAGNOSIS — R002 Palpitations: Secondary | ICD-10-CM | POA: Insufficient documentation

## 2015-04-21 DIAGNOSIS — R51 Headache: Secondary | ICD-10-CM

## 2015-04-21 DIAGNOSIS — J309 Allergic rhinitis, unspecified: Secondary | ICD-10-CM | POA: Insufficient documentation

## 2015-04-21 DIAGNOSIS — F32A Depression, unspecified: Secondary | ICD-10-CM | POA: Insufficient documentation

## 2015-04-21 HISTORY — DX: Gastro-esophageal reflux disease without esophagitis: K21.9

## 2015-04-23 ENCOUNTER — Ambulatory Visit (INDEPENDENT_AMBULATORY_CARE_PROVIDER_SITE_OTHER): Payer: 59 | Admitting: Family Medicine

## 2015-04-23 ENCOUNTER — Encounter: Payer: Self-pay | Admitting: Family Medicine

## 2015-04-23 ENCOUNTER — Other Ambulatory Visit: Payer: Self-pay | Admitting: Family Medicine

## 2015-04-23 ENCOUNTER — Ambulatory Visit
Admission: RE | Admit: 2015-04-23 | Discharge: 2015-04-23 | Disposition: A | Discharge status: Home / Self Care | Payer: 59 | Source: Ambulatory Visit | Attending: Family Medicine | Admitting: Family Medicine

## 2015-04-23 VITALS — BP 170/60 | HR 114 | Temp 98.8°F | Resp 16 | Ht 60.0 in | Wt 161.0 lb

## 2015-04-23 DIAGNOSIS — IMO0002 Reserved for concepts with insufficient information to code with codable children: Secondary | ICD-10-CM

## 2015-04-23 DIAGNOSIS — Z1231 Encounter for screening mammogram for malignant neoplasm of breast: Secondary | ICD-10-CM

## 2015-04-23 DIAGNOSIS — R000 Tachycardia, unspecified: Secondary | ICD-10-CM

## 2015-04-23 DIAGNOSIS — Z23 Encounter for immunization: Secondary | ICD-10-CM

## 2015-04-23 DIAGNOSIS — Z9882 Breast implant status: Secondary | ICD-10-CM | POA: Diagnosis not present

## 2015-04-23 DIAGNOSIS — I10 Essential (primary) hypertension: Secondary | ICD-10-CM | POA: Diagnosis not present

## 2015-04-23 DIAGNOSIS — E1140 Type 2 diabetes mellitus with diabetic neuropathy, unspecified: Secondary | ICD-10-CM | POA: Diagnosis not present

## 2015-04-23 DIAGNOSIS — E782 Mixed hyperlipidemia: Secondary | ICD-10-CM | POA: Diagnosis not present

## 2015-04-23 DIAGNOSIS — E1165 Type 2 diabetes mellitus with hyperglycemia: Secondary | ICD-10-CM

## 2015-04-23 DIAGNOSIS — F329 Major depressive disorder, single episode, unspecified: Secondary | ICD-10-CM

## 2015-04-23 DIAGNOSIS — F32A Depression, unspecified: Secondary | ICD-10-CM

## 2015-04-23 LAB — POCT GLYCOSYLATED HEMOGLOBIN (HGB A1C)
ESTIMATED AVERAGE GLUCOSE: 206
HEMOGLOBIN A1C: 8.8

## 2015-04-23 MED ORDER — GABAPENTIN 300 MG PO CAPS: 600.0000 mg | ORAL_CAPSULE | Freq: Every day | ORAL | Status: DC

## 2015-04-23 MED ORDER — ESCITALOPRAM OXALATE 20 MG PO TABS: 20.0000 mg | ORAL_TABLET | Freq: Every day | ORAL | Status: DC

## 2015-04-23 MED ORDER — FLUCONAZOLE 150 MG PO TABS: 150.0000 mg | ORAL_TABLET | Freq: Once | ORAL | Status: DC

## 2015-04-23 NOTE — Progress Notes (Signed)
Patient: Katie Buckley Female    DOB: 04-02-1951   64 y.o.   MRN: [id number] Visit Date: 04/23/2015  Today's Provider: Lelon Huh, MD   Chief Complaint  Patient presents with  . Follow-up  . Diabetes  . Hyperlipidemia  . Hypertension   Subjective:    HPI   Diabetes Mellitus Type II, Follow-up:   Lab Results  Component Value Date   HGBA1C 8.1* 07/22/2014   HGBA1C 9.2* 03/27/2013   Last seen for diabetes 9 months ago.  Management since then includes; advised to get strict with diet. She reports fair compliance with treatment. She admits to frequently missing the second dose of Invokamet.  She is not having side effects. none Current symptoms include none and have been unchanged. Home blood sugar records: fasting range: 150  Episodes of hypoglycemia? no   Current Insulin Regimen: n/a Most Recent Eye Exam: 4 months ago/[facility] Weight trend: stable Prior visit with dietician: no Current diet: in general, an "unhealthy" diet Current exercise: none  ----------------------------------------------------------------------   Hypertension, follow-up:  BP Readings from Last 3 Encounters:  04/23/15 170/60  07/22/14 150/62    She was last seen for hypertension 9 months ago.  BP at that visit was 150/62. Management since that visit includes; advised to work on decreasing sodium in diet and consider adding ace if not improving.She reports good compliance with treatment. She is not having side effects. none  She is not exercising. She is adherent to low salt diet.   Outside blood pressures are n/a. She is experiencing none.  Patient denies none.   Cardiovascular risk factors include diabetes mellitus.  Use of agents associated with hypertension: none.   ------------------------------------------------------------------------    Lipid/Cholesterol, Follow-up:   Last seen for this 9 months ago.  Management since that visit includes none.  Last  Lipid Panel:    Component Value Date/Time   CHOL 336* 07/22/2014   CHOL 244* 03/28/2013 0419   TRIG 267* 07/22/2014   TRIG 200 03/28/2013 0419   HDL 73* 07/22/2014   HDL 79* 03/28/2013 0419   VLDL 40 03/28/2013 0419   LDLCALC 210 07/22/2014   LDLCALC 125* 03/28/2013 0419    She reports good compliance with treatment. She is not having side effects. none  Wt Readings from Last 3 Encounters:  04/23/15 161 lb (73.029 kg)  07/22/14 159 lb (72.122 kg)    ----------------------------------------------------------------------  Anxiety She states she has been extremely anxious lately due to family situations. She has not been following diet well.   Allergies  Allergen Reactions  . Biaxin  [Clarithromycin]     GI upset, and bad taste in mouth.   Previous Medications   ARMODAFINIL (NUVIGIL) 250 MG TABLET    NUVIGIL, 250MG  (Oral Tablet)  1 Every Day for 0 days  Quantity: 0.00;  Refills: 0   Ordered :22-Apr-2010  Lelon Huh MD;  Started 24-May-2009 Active   ATORVASTATIN (LIPITOR) 40 MG TABLET    Take 1 tablet (40 mg total) by mouth daily.   BUTALBITAL-ACETAMINOPHEN-CAFFEINE (FIORICET, ESGIC) 50-325-40 MG PER TABLET    Take 1 tablet by mouth every 6 (six) hours as needed for headache.   CANAGLIFLOZIN-METFORMIN HCL (INVOKAMET) [phone] MG TABS    Take by mouth 2 (two) times daily.    ESCITALOPRAM (LEXAPRO) 10 MG TABLET    Take by mouth daily. 1 1/2 tablets   ESOMEPRAZOLE (NEXIUM) 40 MG CAPSULE    Take 1 capsule (40 mg total) by  mouth daily.   FLUTICASONE (FLONASE) 50 MCG/ACT NASAL SPRAY    Place into the nose.   GABAPENTIN (NEURONTIN) 100 MG CAPSULE    Take by mouth 3 (three) times daily.    GLIPIZIDE (GLUCOTROL) 10 MG TABLET    Take 1 tablet (10 mg total) by mouth daily before breakfast.   LANCET DEVICES MISC       METOPROLOL-HYDROCHLOROTHIAZIDE (LOPRESSOR HCT) 100-25 MG PER TABLET    Take 0.5 tablets by mouth 2 (two) times daily.   MONTELUKAST (SINGULAIR) 10 MG TABLET     TAKE 1 TABLET BY MOUTH DAILY   OLOPATADINE HCL (PATADAY) 0.2 % SOLN    Apply to eye.   PROMETHAZINE (PHENERGAN) 25 MG TABLET    Take by mouth.   VALACYCLOVIR (VALTREX) 1000 MG TABLET    Take by mouth.   VALSARTAN-HYDROCHLOROTHIAZIDE (DIOVAN-HCT) 320-12.5 MG PER TABLET    Take 1 tablet by mouth daily.   ZOLPIDEM (AMBIEN) 5 MG TABLET    Take 5 mg by mouth once.     Review of Systems  Cardiovascular: Positive for palpitations.  Neurological: Positive for light-headedness.       Off-balance    Social History  Substance Use Topics  . Smoking status: Never Smoker   . Smokeless tobacco: Never Used  . Alcohol Use: No   Objective:   BP 170/60 mmHg  Pulse 114  Temp(Src) 98.8 F (37.1 C) (Oral)  Resp 16  Ht 5' (1.524 m)  Wt 161 lb (73.029 kg)  BMI 31.44 kg/m2  SpO2 96%  Physical Exam  General Appearance:    Alert, cooperative, no distress, obese  Eyes:    PERRL, conjunctiva/corneas clear, EOM's intact       Lungs:     Clear to auscultation bilaterally, respirations unlabored  Heart:    Tachycardic, regular rhythm. II/VI systolic murmur  Neurologic:   Awake, alert, oriented x 3. No apparent focal neurological           defect.         Results for orders placed or performed in visit on 04/23/15  POCT glycosylated hemoglobin (Hb A1C)  Result Value Ref Range   Hemoglobin A1C 8.8    Est. average glucose Bld gHb Est-mCnc 206        Assessment & Plan:     1. Uncontrolled type 2 diabetes mellitus with diabetic neuropathy, without long-term current use of insulin (HCC) UNcontrolled, partially due to poor compliance with invokamet. She states she is going to make sure she remembers the second dose more consistently.  - POCT glycosylated hemoglobin (Hb A1C)  2. Hyperlipidemia, mixed She is tolerating atorvastatin well with no adverse effects.   - Lipid panel  3. Essential hypertension Likely elevated today due to stress and anxiety. Will increase escitalopram as below and  recheck BP - Magnesium - Renal function panel  4. Depression/anxiety Increase escitalopram to 20mg  daily.   5. Tachycardia Likely due to stress and anxiety as above.  - Magnesium - T4 AND TSH  6. Need for influenza vaccination  - Flu Vaccine QUAD 36+ mos IM       Lelon Huh, MD  [facility]

## 2015-04-26 ENCOUNTER — Other Ambulatory Visit: Payer: Self-pay | Admitting: Family Medicine

## 2015-04-26 ENCOUNTER — Encounter: Payer: Self-pay | Admitting: Family Medicine

## 2015-04-26 DIAGNOSIS — F419 Anxiety disorder, unspecified: Secondary | ICD-10-CM | POA: Insufficient documentation

## 2015-04-26 NOTE — Telephone Encounter (Signed)
Pharmacy requesting refill.

## 2015-04-28 ENCOUNTER — Other Ambulatory Visit: Payer: Self-pay | Admitting: Family Medicine

## 2015-04-28 DIAGNOSIS — R928 Other abnormal and inconclusive findings on diagnostic imaging of breast: Secondary | ICD-10-CM

## 2015-05-14 ENCOUNTER — Ambulatory Visit
Admission: RE | Admit: 2015-05-14 | Discharge: 2015-05-14 | Disposition: A | Discharge status: Home / Self Care | Payer: 59 | Source: Ambulatory Visit | Attending: Family Medicine | Admitting: Family Medicine

## 2015-05-14 ENCOUNTER — Other Ambulatory Visit: Payer: Self-pay | Admitting: *Deleted

## 2015-05-14 ENCOUNTER — Telehealth: Payer: Self-pay | Admitting: Family Medicine

## 2015-05-14 DIAGNOSIS — R928 Other abnormal and inconclusive findings on diagnostic imaging of breast: Secondary | ICD-10-CM

## 2015-05-14 DIAGNOSIS — F32A Depression, unspecified: Secondary | ICD-10-CM

## 2015-05-14 DIAGNOSIS — T8543XA Leakage of breast prosthesis and implant, initial encounter: Secondary | ICD-10-CM

## 2015-05-14 DIAGNOSIS — Z9882 Breast implant status: Secondary | ICD-10-CM | POA: Diagnosis not present

## 2015-05-14 DIAGNOSIS — F329 Major depressive disorder, single episode, unspecified: Secondary | ICD-10-CM

## 2015-05-14 NOTE — Telephone Encounter (Signed)
Pt called saying that she had a mammogram and US done of her breast and it showed that implant in her breast had ruptured.  She had these done a long time ago and the doctor is no longer in practice.  She wants to know id there someone you can refer her to .  Her call back is [phone]  Thanks. Con Memos

## 2015-05-14 NOTE — Telephone Encounter (Signed)
Our records had her on 1 1/2 of the 10mg  Lexapro tablets before.  If Dr.Chen had actually been prescribing the 20mg  tablets, then she can try going up to 2 x 20mg  daily, since they do not make a 40mg  tablet. Can send in rx for #60 tablets with one refill. If they does not help she will need referral to psychiatry.

## 2015-05-14 NOTE — Telephone Encounter (Signed)
LMOVM for pt to return call 

## 2015-05-14 NOTE — Telephone Encounter (Signed)
Please advise. Thanks.  

## 2015-05-14 NOTE — Telephone Encounter (Signed)
Patient is requesting Lexapro 40 mg. Patient stated that the dose was supposed to be increased with the last refill?

## 2015-05-18 ENCOUNTER — Other Ambulatory Visit: Payer: Self-pay | Admitting: *Deleted

## 2015-05-18 ENCOUNTER — Encounter: Payer: Self-pay | Admitting: Family Medicine

## 2015-05-18 DIAGNOSIS — T8543XA Leakage of breast prosthesis and implant, initial encounter: Secondary | ICD-10-CM | POA: Insufficient documentation

## 2015-05-18 MED ORDER — ESOMEPRAZOLE MAGNESIUM 40 MG PO CPDR: 40.0000 mg | DELAYED_RELEASE_CAPSULE | Freq: Every day | ORAL | Status: DC

## 2015-05-18 NOTE — Telephone Encounter (Signed)
Pt states she will set up appointment to plastic surgeon

## 2015-05-18 NOTE — Telephone Encounter (Signed)
Patient needs referral to plastic surgery for ruptured breast implant. i don't think we have anyone in [place] that does this. Will probably need to go to [place]. Thanks.

## 2015-05-18 NOTE — Progress Notes (Signed)
This encounter was created in error - please disregard.

## 2015-05-19 LAB — RENAL FUNCTION PANEL
Albumin: 4.3 g/dL (ref 3.6–4.8)
BUN/Creatinine Ratio: 28 — ABNORMAL HIGH (ref 11–26)
BUN: 17 mg/dL (ref 8–27)
CO2: 27 mmol/L (ref 18–29)
Calcium: 9.1 mg/dL (ref 8.7–10.3)
Chloride: 100 mmol/L (ref 97–106)
Creatinine, Ser: 0.61 mg/dL (ref 0.57–1.00)
GFR calc Af Amer: 112 mL/min/{1.73_m2} (ref >59)
GFR calc non Af Amer: 97 mL/min/{1.73_m2} (ref >59)
Glucose: 297 mg/dL — ABNORMAL HIGH (ref 65–99)
Phosphorus: 3.1 mg/dL (ref 2.5–4.5)
Potassium: 4 mmol/L (ref 3.5–5.2)
Sodium: 138 mmol/L (ref 136–144)

## 2015-05-19 LAB — LIPID PANEL
Chol/HDL Ratio: 4.3 ratio units (ref 0.0–4.4)
Cholesterol, Total: 360 mg/dL — ABNORMAL HIGH (ref 100–199)
HDL: 83 mg/dL (ref >39)
LDL Calculated: 206 mg/dL — ABNORMAL HIGH (ref 0–99)
Triglycerides: 355 mg/dL — ABNORMAL HIGH (ref 0–149)
VLDL Cholesterol Cal: 71 mg/dL — ABNORMAL HIGH (ref 5–40)

## 2015-05-19 LAB — T4 AND TSH
T4 TOTAL: 5.9 ug/dL (ref 4.5–12.0)
TSH: 1.16 u[IU]/mL (ref 0.450–4.500)

## 2015-05-19 LAB — MAGNESIUM: MAGNESIUM: 2.1 mg/dL (ref 1.6–2.3)

## 2015-05-19 NOTE — Telephone Encounter (Signed)
LMOVM for pt to return call 

## 2015-05-19 NOTE — Telephone Encounter (Signed)
Pt returned your call. ° °ThanksTeri °

## 2015-05-20 ENCOUNTER — Telehealth: Payer: Self-pay

## 2015-05-20 DIAGNOSIS — E782 Mixed hyperlipidemia: Secondary | ICD-10-CM

## 2015-05-20 MED ORDER — ATORVASTATIN CALCIUM 80 MG PO TABS: 80.0000 mg | ORAL_TABLET | Freq: Every day | ORAL | Status: DC

## 2015-05-20 MED ORDER — ESCITALOPRAM OXALATE 20 MG PO TABS: 20.0000 mg | ORAL_TABLET | Freq: Every day | ORAL | Status: DC

## 2015-05-20 NOTE — Telephone Encounter (Signed)
Advised patient as below.  

## 2015-05-20 NOTE — Telephone Encounter (Signed)
Advised patient of lab results. Sent in medication as below. Patient will keep F/U appt in Feb.

## 2015-05-20 NOTE — Telephone Encounter (Signed)
-----   Message from [name], MD sent at 05/19/2015  7:41 AM EST ----- Cholesterol is extremely high at 360. She needs to increase atorvastatin to 80mg  daily, #30, rf x 3.thyroid functions normal. follow up in February as already scheduled.

## 2015-05-24 ENCOUNTER — Other Ambulatory Visit: Payer: Self-pay | Admitting: *Deleted

## 2015-05-24 ENCOUNTER — Other Ambulatory Visit: Payer: Self-pay | Admitting: Family Medicine

## 2015-05-24 MED ORDER — BUTALBITAL-APAP-CAFFEINE 50-325-40 MG PO TABS: 1.0000 | ORAL_TABLET | Freq: Four times a day (QID) | ORAL | Status: DC | PRN

## 2015-05-24 MED ORDER — FLUCONAZOLE 150 MG PO TABS: 150.0000 mg | ORAL_TABLET | Freq: Once | ORAL | Status: DC

## 2015-05-24 NOTE — Telephone Encounter (Signed)
Please call in rx for butalbital

## 2015-05-24 NOTE — Telephone Encounter (Signed)
Rx called in to pharmacy. 

## 2015-05-27 ENCOUNTER — Other Ambulatory Visit: Payer: Self-pay | Admitting: *Deleted

## 2015-05-27 MED ORDER — FLUTICASONE PROPIONATE 50 MCG/ACT NA SUSP: 2.0000 | Freq: Every day | NASAL | Status: DC

## 2015-05-28 ENCOUNTER — Ambulatory Visit (INDEPENDENT_AMBULATORY_CARE_PROVIDER_SITE_OTHER): Payer: 59 | Admitting: Family Medicine

## 2015-05-28 ENCOUNTER — Encounter: Payer: Self-pay | Admitting: Family Medicine

## 2015-05-28 VITALS — BP 170/78 | HR 100 | Temp 99.5°F | Resp 16 | Ht 60.0 in | Wt 161.0 lb

## 2015-05-28 DIAGNOSIS — F419 Anxiety disorder, unspecified: Secondary | ICD-10-CM

## 2015-05-28 DIAGNOSIS — R197 Diarrhea, unspecified: Secondary | ICD-10-CM

## 2015-05-28 DIAGNOSIS — R350 Frequency of micturition: Secondary | ICD-10-CM

## 2015-05-28 DIAGNOSIS — IMO0002 Reserved for concepts with insufficient information to code with codable children: Secondary | ICD-10-CM

## 2015-05-28 DIAGNOSIS — E1165 Type 2 diabetes mellitus with hyperglycemia: Secondary | ICD-10-CM

## 2015-05-28 DIAGNOSIS — J309 Allergic rhinitis, unspecified: Secondary | ICD-10-CM

## 2015-05-28 DIAGNOSIS — J329 Chronic sinusitis, unspecified: Secondary | ICD-10-CM

## 2015-05-28 DIAGNOSIS — E1140 Type 2 diabetes mellitus with diabetic neuropathy, unspecified: Secondary | ICD-10-CM | POA: Diagnosis not present

## 2015-05-28 LAB — POCT URINALYSIS DIPSTICK
Bilirubin, UA: NEGATIVE
Leukocytes, UA: NEGATIVE
NITRITE UA: NEGATIVE
PH UA: 7
PROTEIN UA: NEGATIVE
RBC UA: NEGATIVE
Spec Grav, UA: 1.01
UROBILINOGEN UA: 0.2

## 2015-05-28 MED ORDER — FLUTICASONE PROPIONATE 50 MCG/ACT NA SUSP: 2.0000 | Freq: Every day | NASAL | Status: DC

## 2015-05-28 MED ORDER — AMITRIPTYLINE HCL 10 MG PO TABS: ORAL_TABLET | ORAL | Status: DC

## 2015-05-28 MED ORDER — AMOXICILLIN 500 MG PO CAPS: 1000.0000 mg | ORAL_CAPSULE | Freq: Two times a day (BID) | ORAL | Status: AC

## 2015-05-28 NOTE — Patient Instructions (Signed)
Stop all dairy products for the next two weeks Start taking a pro-biotic Align once daily

## 2015-05-28 NOTE — Progress Notes (Signed)
Patient: Katie Buckley Female    DOB: [birth year]   64 y.o.   MRN: [id number] Visit Date: 05/28/2015  Today's Provider: Lelon Huh, MD   Chief Complaint  Patient presents with  . Diarrhea   Subjective:       HPI  Patient has been having frequent diarrhea for the last 2 months. Diarrhea is not constant, is having bloatedness, constipation. Also having urine frequency, Has frequent cramps. Has stool urgency and at least one episode of stool incontinence. No nausea or vomiting. States she was eating a lot of fruit which she has since cut out of diet with minimal improvement. Does not seem to be related to any other types of food. Has not seen any blood in stool. She reports her last colonoscopy was in 2006 and she was advised to repeat after 5 years.  She is noted to be on metformin for several years for diabetes, but did not have any GI side effects when when she was initially put on it.   Urinary frequency with vaginal pain and tingling. Urine symptoms have been going for 2 months also.   Sinus pressure: States she has had constant sensation of pressure around her eyes and dose the last few months with frequent sinus headaches. She has occasional dark yellow nasal discharge. No epistaxis. She had been on flonase which she states she ran out of a few months ago.   Anxiety: She states she has been under tremendous stress the last several months, particularly with work. She does not sleep well. Is taking Lexapro daily and Ambien most nights to help her sleep.    Lab Results  Component Value Date   HGBA1C 8.8 04/23/2015   CMP Latest Ref Rng 05/18/2015 07/22/2014 02/08/2014  Glucose 65 - 99 mg/dL 297(H) - 155(H)  BUN 8 - 27 mg/dL 17 13 13   Creatinine 0.57 - 1.00 mg/dL 0.61 0.6 0.87  Sodium 136 - 144 mmol/L 138 140 138  Potassium 3.5 - 5.2 mmol/L 4.0 3.9 3.7  Chloride 97 - 106 mmol/L 100 - 99  CO2 18 - 29 mmol/L 27 - 31  Calcium 8.7 - 10.3 mg/dL 9.1 - 9.0  Total Protein  6.4-8.2 g/dL - - 7.4  Total Bilirubin 0.2-1.0 mg/dL - - 0.2  Alkaline Phos - - - 102  AST 15-37 Unit/L - - 31  ALT - - - 41   Lab Results  Component Value Date   TSH 1.160 05/18/2015      Allergies  Allergen Reactions  . Biaxin  [Clarithromycin]     GI upset, and bad taste in mouth.   Previous Medications   ARMODAFINIL (NUVIGIL) 250 MG TABLET    NUVIGIL, 250MG  (Oral Tablet)  1 Every Day for 0 days  Quantity: 0.00;  Refills: 0   Ordered :22-Apr-2010  Lelon Huh MD;  Started 24-May-2009 Active   ATORVASTATIN (LIPITOR) 80 MG TABLET    Take 1 tablet (80 mg total) by mouth daily.   BUTALBITAL-ACETAMINOPHEN-CAFFEINE (FIORICET, ESGIC) 50-325-40 MG TABLET    Take 1 tablet by mouth every 6 (six) hours as needed for headache.   CANAGLIFLOZIN-METFORMIN HCL (INVOKAMET) [phone] MG TABS    Take by mouth 2 (two) times daily.    ESCITALOPRAM (LEXAPRO) 20 MG TABLET    Take 1 tablet (20 mg total) by mouth daily.   ESOMEPRAZOLE (NEXIUM) 40 MG CAPSULE    Take 1 capsule (40 mg total) by mouth daily.   FLUCONAZOLE (DIFLUCAN) 150  MG TABLET    Take 1 tablet (150 mg total) by mouth once.   FLUTICASONE (FLONASE) 50 MCG/ACT NASAL SPRAY    Place 2 sprays into both nostrils daily.   GABAPENTIN (NEURONTIN) 300 MG CAPSULE    Take 2 capsules (600 mg total) by mouth at bedtime.   GLIPIZIDE (GLUCOTROL) 10 MG TABLET    Take 1 tablet (10 mg total) by mouth daily before breakfast.   LANCET DEVICES MISC       METOPROLOL-HYDROCHLOROTHIAZIDE (LOPRESSOR HCT) 100-25 MG TABLET    TAKE 1/2 TABLET BY MOUTH 2 TIMES DAILY. **MUST MAKE AN APPOINTMENT**   MONTELUKAST (SINGULAIR) 10 MG TABLET    TAKE 1 TABLET BY MOUTH DAILY   OLOPATADINE HCL (PATADAY) 0.2 % SOLN    Apply to eye.   PROMETHAZINE (PHENERGAN) 25 MG TABLET    Take by mouth.   VALACYCLOVIR (VALTREX) 1000 MG TABLET    Take by mouth.   VALSARTAN-HYDROCHLOROTHIAZIDE (DIOVAN-HCT) 320-12.5 MG PER TABLET    Take 1 tablet by mouth daily.   ZOLPIDEM (AMBIEN) 5 MG  TABLET    Take 5 mg by mouth once.     Review of Systems  Constitutional: Positive for appetite change and fatigue.  Respiratory: Negative for chest tightness and shortness of breath.   Cardiovascular: Negative for chest pain and palpitations.  Gastrointestinal: Positive for constipation and abdominal distention. Negative for nausea, blood in stool, anal bleeding and rectal pain.  Genitourinary: Positive for urgency, frequency, vaginal pain and pelvic pain. Negative for vaginal discharge and difficulty urinating.  Neurological: Negative for dizziness and weakness.    Social History  Substance Use Topics  . Smoking status: Never Smoker   . Smokeless tobacco: Never Used  . Alcohol Use: No   Objective:   BP 170/78 mmHg  Pulse 100  Temp(Src) 99.5 F (37.5 C) (Oral)  Resp 16  Ht 5' (1.524 m)  Wt 161 lb (73.029 kg)  BMI 31.44 kg/m2  SpO2 96%  Physical Exam  General Appearance:    Alert, cooperative, no distress, obese  ENT:   Nasal mucosa pale and congested. No discharge. Mild maxillary sinus tenderness.   [id number]:    PERRL, conjunctiva/corneas clear, EOM's intact       Lungs:     Clear to auscultation bilaterally, respirations unlabored  Heart:    Regular rate and rhythm  Neurologic:   Awake, alert, oriented x 3. No apparent focal neurological           defect.   Abd:   Soft, obese, no masses. Mild generalized tenderness. No rebound or guarding. No CVA tenderness.    Results for orders placed or performed in visit on 05/28/15  POCT urinalysis dipstick  Result Value Ref Range   Color, UA Yellow    Clarity, UA Clear    Glucose, UA 2000 or more    Bilirubin, UA Neg    Ketones, UA Small    Spec Grav, UA 1.010    Blood, UA Neg    pH, UA 7.0    Protein, UA Neg    Urobilinogen, UA 0.2    Nitrite, UA Neg    Leukocytes, UA Negative Negative        Assessment & Plan:     .1. Frequency of urination Extensive ddx, but no sign of infection on u/a. May be related to  glucosuria, anxiety, or irritable bladder. Try TCA f.  - POCT urinalysis dipstick - amitriptyline (ELAVIL) 10 MG tablet; One tablet at bedtime  for four days, then increase to 2 tablets at bedtime for four days, then 3 tablets at bedtime.  Dispense: 60 tablet; Refill: 0  2. Diarrhea, unspecified type Spend considerable time reviewing extensive Ddx, which includes dietary sensitivities, medication effects (possible metformin or SSRI), and IBS. Is likely being exacerbated by anxiety.  She is on Q5 year colonoscopy and she is going to contact her GI to get up to date on this. Start amitriptyline as above for possible IBS. Avoid lactose in diet. Start OTC probiotics. (suggested Align) If not improving with a few weeks consider gluten free diet and putting metformin on hold.   3. Acute anxiety Continue SSRI for the time being. May benefit from TCA as above.   4. Allergic rhinitis, unspecified allergic rhinitis type She has fluticasone at home and is to restart.  - fluticasone (FLONASE) 50 MCG/ACT nasal spray; Place 2 sprays into both nostrils daily.  Dispense: 16 g; Refill: 6  5. Uncontrolled type 2 diabetes mellitus with diabetic neuropathy, without long-term current use of insulin ([place])   6. Sinusitis, unspecified chronicity, unspecified location  - amoxicillin (AMOXIL) 500 MG capsule; Take 2 capsules (1,000 mg total) by mouth 2 (two) times daily.  Dispense: 28 capsule; Refill: 0  Addressed extensive list of chronic and acute medical problems today requiring extensive time in counseling and coordination care.  Over half of this 45 minute visit were spent in counseling and coordinating care of multiple medical problems.       Lelon Huh, MD  [facility]

## 2015-05-30 ENCOUNTER — Encounter: Payer: Self-pay | Admitting: Family Medicine

## 2015-06-03 ENCOUNTER — Other Ambulatory Visit: Payer: Self-pay | Admitting: Family Medicine

## 2015-06-16 ENCOUNTER — Telehealth: Payer: Self-pay | Admitting: Family Medicine

## 2015-06-16 NOTE — Telephone Encounter (Signed)
Patient stated she is feeling better. Her blood sugar has come back down an average around 200, after meal. Patient stated that she is going to continue to keep a check on her blood sugar and if it gets that high again she call will the ov and schedule an earlier appt.

## 2015-06-16 NOTE — Telephone Encounter (Signed)
Please check with patient to see if sugars are doing better today. Received message from call-a-nurse that it was over 400 last night. If still very high she may need to go ahead and start on insulin injections.

## 2015-06-21 ENCOUNTER — Ambulatory Visit: Payer: 59 | Admitting: Family Medicine

## 2015-06-22 ENCOUNTER — Other Ambulatory Visit: Payer: Self-pay | Admitting: *Deleted

## 2015-06-22 DIAGNOSIS — R350 Frequency of micturition: Secondary | ICD-10-CM

## 2015-06-22 MED ORDER — AMITRIPTYLINE HCL 10 MG PO TABS: ORAL_TABLET | ORAL | Status: DC

## 2015-07-08 DIAGNOSIS — F411 Generalized anxiety disorder: Secondary | ICD-10-CM | POA: Diagnosis not present

## 2015-07-08 DIAGNOSIS — F331 Major depressive disorder, recurrent, moderate: Secondary | ICD-10-CM | POA: Diagnosis not present

## 2015-07-19 ENCOUNTER — Other Ambulatory Visit: Payer: Self-pay

## 2015-07-19 NOTE — Telephone Encounter (Signed)
Patient requesting refill. This medications is originally prescribed by Dr. Bridgett Larsson. Patient reports that it helps greatly with her depression and lack of energy.

## 2015-07-27 ENCOUNTER — Ambulatory Visit: Payer: 59 | Admitting: Family Medicine

## 2015-08-02 ENCOUNTER — Ambulatory Visit: Payer: 59 | Admitting: Family Medicine

## 2015-08-06 DIAGNOSIS — F411 Generalized anxiety disorder: Secondary | ICD-10-CM | POA: Diagnosis not present

## 2015-08-06 DIAGNOSIS — F331 Major depressive disorder, recurrent, moderate: Secondary | ICD-10-CM | POA: Diagnosis not present

## 2015-08-16 ENCOUNTER — Other Ambulatory Visit: Payer: Self-pay | Admitting: Family Medicine

## 2015-08-17 NOTE — Telephone Encounter (Signed)
Please call in butalbital.  

## 2015-08-17 NOTE — Telephone Encounter (Signed)
Rx called in to pharmacy. 

## 2015-08-20 DIAGNOSIS — Z8371 Family history of colonic polyps: Secondary | ICD-10-CM | POA: Diagnosis not present

## 2015-08-20 DIAGNOSIS — F419 Anxiety disorder, unspecified: Secondary | ICD-10-CM | POA: Diagnosis not present

## 2015-08-20 DIAGNOSIS — K219 Gastro-esophageal reflux disease without esophagitis: Secondary | ICD-10-CM | POA: Diagnosis not present

## 2015-08-20 DIAGNOSIS — Z860100 Personal history of colon polyps, unspecified: Secondary | ICD-10-CM | POA: Insufficient documentation

## 2015-08-20 DIAGNOSIS — Z8719 Personal history of other diseases of the digestive system: Secondary | ICD-10-CM | POA: Diagnosis not present

## 2015-08-25 DIAGNOSIS — E119 Type 2 diabetes mellitus without complications: Secondary | ICD-10-CM | POA: Diagnosis not present

## 2015-08-25 LAB — HM DIABETES EYE EXAM

## 2015-08-27 ENCOUNTER — Encounter: Payer: Self-pay | Admitting: Family Medicine

## 2015-09-16 DIAGNOSIS — F331 Major depressive disorder, recurrent, moderate: Secondary | ICD-10-CM | POA: Diagnosis not present

## 2015-09-16 DIAGNOSIS — F411 Generalized anxiety disorder: Secondary | ICD-10-CM | POA: Diagnosis not present

## 2015-09-20 ENCOUNTER — Other Ambulatory Visit: Payer: Self-pay | Admitting: Family Medicine

## 2015-10-18 ENCOUNTER — Other Ambulatory Visit: Payer: Self-pay | Admitting: *Deleted

## 2015-10-18 MED ORDER — FLUCONAZOLE 150 MG PO TABS: 150.0000 mg | ORAL_TABLET | Freq: Once | ORAL | Status: DC

## 2015-10-27 ENCOUNTER — Other Ambulatory Visit: Payer: Self-pay | Admitting: *Deleted

## 2015-10-27 MED ORDER — CANAGLIFLOZIN-METFORMIN HCL 150-1000 MG PO TABS: 1.0000 | ORAL_TABLET | Freq: Two times a day (BID) | ORAL | Status: DC

## 2015-11-01 ENCOUNTER — Other Ambulatory Visit: Payer: Self-pay | Admitting: *Deleted

## 2015-11-01 MED ORDER — BUTALBITAL-APAP-CAFFEINE 50-325-40 MG PO TABS: ORAL_TABLET | ORAL | Status: DC

## 2015-11-01 NOTE — Telephone Encounter (Signed)
Please call in butalbital.  

## 2015-11-01 NOTE — Telephone Encounter (Signed)
Rx called in to pharmacy. 

## 2015-11-04 DIAGNOSIS — H1013 Acute atopic conjunctivitis, bilateral: Secondary | ICD-10-CM | POA: Diagnosis not present

## 2015-11-12 ENCOUNTER — Other Ambulatory Visit: Payer: Self-pay | Admitting: Family Medicine

## 2015-11-17 ENCOUNTER — Ambulatory Visit: Payer: 59 | Admitting: Anesthesiology

## 2015-11-17 ENCOUNTER — Ambulatory Visit
Admission: RE | Admit: 2015-11-17 | Discharge: 2015-11-17 | Disposition: A | Discharge status: Home / Self Care | Payer: 59 | Source: Ambulatory Visit | Attending: Unknown Physician Specialty | Admitting: Unknown Physician Specialty

## 2015-11-17 ENCOUNTER — Encounter: Payer: Self-pay | Admitting: *Deleted

## 2015-11-17 ENCOUNTER — Encounter
Admission: RE | Disposition: A | Discharge status: Home / Self Care | Payer: Self-pay | Source: Ambulatory Visit | Attending: Unknown Physician Specialty

## 2015-11-17 DIAGNOSIS — F419 Anxiety disorder, unspecified: Secondary | ICD-10-CM | POA: Insufficient documentation

## 2015-11-17 DIAGNOSIS — K2950 Unspecified chronic gastritis without bleeding: Secondary | ICD-10-CM | POA: Insufficient documentation

## 2015-11-17 DIAGNOSIS — K640 First degree hemorrhoids: Secondary | ICD-10-CM | POA: Insufficient documentation

## 2015-11-17 DIAGNOSIS — R011 Cardiac murmur, unspecified: Secondary | ICD-10-CM | POA: Diagnosis not present

## 2015-11-17 DIAGNOSIS — K635 Polyp of colon: Secondary | ICD-10-CM | POA: Diagnosis not present

## 2015-11-17 DIAGNOSIS — K2270 Barrett's esophagus without dysplasia: Secondary | ICD-10-CM | POA: Diagnosis not present

## 2015-11-17 DIAGNOSIS — Z85828 Personal history of other malignant neoplasm of skin: Secondary | ICD-10-CM | POA: Diagnosis not present

## 2015-11-17 DIAGNOSIS — D124 Benign neoplasm of descending colon: Secondary | ICD-10-CM | POA: Diagnosis not present

## 2015-11-17 DIAGNOSIS — K219 Gastro-esophageal reflux disease without esophagitis: Secondary | ICD-10-CM | POA: Insufficient documentation

## 2015-11-17 DIAGNOSIS — E119 Type 2 diabetes mellitus without complications: Secondary | ICD-10-CM | POA: Insufficient documentation

## 2015-11-17 DIAGNOSIS — Z8371 Family history of colonic polyps: Secondary | ICD-10-CM | POA: Diagnosis not present

## 2015-11-17 DIAGNOSIS — F329 Major depressive disorder, single episode, unspecified: Secondary | ICD-10-CM | POA: Diagnosis not present

## 2015-11-17 DIAGNOSIS — R1310 Dysphagia, unspecified: Secondary | ICD-10-CM | POA: Insufficient documentation

## 2015-11-17 DIAGNOSIS — Z9049 Acquired absence of other specified parts of digestive tract: Secondary | ICD-10-CM | POA: Diagnosis not present

## 2015-11-17 DIAGNOSIS — D123 Benign neoplasm of transverse colon: Secondary | ICD-10-CM | POA: Diagnosis not present

## 2015-11-17 DIAGNOSIS — Z1211 Encounter for screening for malignant neoplasm of colon: Secondary | ICD-10-CM | POA: Diagnosis not present

## 2015-11-17 DIAGNOSIS — K2970 Gastritis, unspecified, without bleeding: Secondary | ICD-10-CM | POA: Diagnosis not present

## 2015-11-17 DIAGNOSIS — K210 Gastro-esophageal reflux disease with esophagitis: Secondary | ICD-10-CM | POA: Diagnosis not present

## 2015-11-17 DIAGNOSIS — Z79899 Other long term (current) drug therapy: Secondary | ICD-10-CM | POA: Diagnosis not present

## 2015-11-17 DIAGNOSIS — D120 Benign neoplasm of cecum: Secondary | ICD-10-CM | POA: Insufficient documentation

## 2015-11-17 DIAGNOSIS — K2930 Chronic superficial gastritis without bleeding: Secondary | ICD-10-CM | POA: Diagnosis not present

## 2015-11-17 DIAGNOSIS — I10 Essential (primary) hypertension: Secondary | ICD-10-CM | POA: Diagnosis not present

## 2015-11-17 HISTORY — DX: Cardiac murmur, unspecified: R01.1

## 2015-11-17 HISTORY — DX: Anxiety disorder, unspecified: F41.9

## 2015-11-17 HISTORY — DX: Other seasonal allergic rhinitis: J30.2

## 2015-11-17 HISTORY — DX: Depression, unspecified: F32.A

## 2015-11-17 HISTORY — PX: ESOPHAGOGASTRODUODENOSCOPY (EGD) WITH PROPOFOL: SHX5813

## 2015-11-17 HISTORY — DX: Major depressive disorder, single episode, unspecified: F32.9

## 2015-11-17 HISTORY — PX: COLONOSCOPY WITH PROPOFOL: SHX5780

## 2015-11-17 LAB — GLUCOSE, CAPILLARY: Glucose-Capillary: 271 mg/dL — ABNORMAL HIGH (ref 65–99)

## 2015-11-17 SURGERY — COLONOSCOPY WITH PROPOFOL
Anesthesia: General

## 2015-11-17 MED ORDER — SODIUM CHLORIDE 0.9 % IV SOLN
INTRAVENOUS | Status: DC
  Administered 2015-11-17: 1000 mL via INTRAVENOUS

## 2015-11-17 MED ORDER — PROPOFOL 500 MG/50ML IV EMUL
INTRAVENOUS | Status: DC | PRN
  Administered 2015-11-17: 125 ug/kg/min via INTRAVENOUS

## 2015-11-17 MED ORDER — SODIUM CHLORIDE 0.9 % IV SOLN: INTRAVENOUS | Status: DC

## 2015-11-17 MED ORDER — PROPOFOL 10 MG/ML IV BOLUS
INTRAVENOUS | Status: DC | PRN
  Administered 2015-11-17: 20 mg via INTRAVENOUS
  Administered 2015-11-17: 50 mg via INTRAVENOUS
  Administered 2015-11-17: 20 mg via INTRAVENOUS

## 2015-11-17 MED ORDER — FENTANYL CITRATE (PF) 100 MCG/2ML IJ SOLN
INTRAMUSCULAR | Status: DC | PRN
  Administered 2015-11-17: 50 ug via INTRAVENOUS

## 2015-11-17 MED ORDER — GLYCOPYRROLATE 0.2 MG/ML IJ SOLN
INTRAMUSCULAR | Status: DC | PRN
  Administered 2015-11-17: 0.2 mg via INTRAVENOUS

## 2015-11-17 MED ORDER — EPHEDRINE SULFATE 50 MG/ML IJ SOLN
INTRAMUSCULAR | Status: DC | PRN
  Administered 2015-11-17: 5 mg via INTRAVENOUS
  Administered 2015-11-17 (×2): 10 mg via INTRAVENOUS

## 2015-11-17 MED ORDER — PHENYLEPHRINE HCL 10 MG/ML IJ SOLN
INTRAMUSCULAR | Status: DC | PRN
Start: 2015-11-17 — End: 2015-11-17
  Administered 2015-11-17 (×6): 100 ug via INTRAVENOUS

## 2015-11-17 NOTE — Anesthesia Postprocedure Evaluation (Signed)
Anesthesia Post Note  Patient: Katie Buckley  Procedure(s) Performed: Procedure(s) (LRB): COLONOSCOPY WITH PROPOFOL (N/A) ESOPHAGOGASTRODUODENOSCOPY (EGD) WITH PROPOFOL (N/A)  Patient location during evaluation: PACU Anesthesia Type: General Level of consciousness: awake and alert Pain management: pain level controlled Vital Signs Assessment: post-procedure vital signs reviewed and stable Respiratory status: spontaneous breathing, nonlabored ventilation, respiratory function stable and patient connected to nasal cannula oxygen Cardiovascular status: blood pressure returned to baseline and stable Postop Assessment: no signs of nausea or vomiting Anesthetic complications: no    Last Vitals:  Filed Vitals:   11/17/15 0849 11/17/15 0859  BP: 111/59 120/55  Pulse: 75 74  Temp:    Resp: 19 14    Last Pain: There were no vitals filed for this visit.               Martha Clan

## 2015-11-17 NOTE — Transfer of Care (Signed)
Immediate Anesthesia Transfer of Care Note  Patient: Katie Buckley  Procedure(s) Performed: Procedure(s): COLONOSCOPY WITH PROPOFOL (N/A) ESOPHAGOGASTRODUODENOSCOPY (EGD) WITH PROPOFOL (N/A)  Patient Location: PACU  Anesthesia Type:General  Level of Consciousness: awake, alert , oriented and patient cooperative  Airway & Oxygen Therapy: Patient Spontanous Breathing and Patient connected to nasal cannula oxygen  Post-op Assessment: Report given to RN, Post -op Vital signs reviewed and stable and Patient moving all extremities  Post vital signs: Reviewed and stable  Last Vitals:  Filed Vitals:   11/17/15 0716 11/17/15 0829  BP: 151/60 95/52  Pulse: 59 77  Temp: 36.2 C 36.4 C  Resp: 18 17    Last Pain: There were no vitals filed for this visit.       Complications: No apparent anesthesia complications

## 2015-11-17 NOTE — H&P (Signed)
Primary Care Physician:  Lelon Huh, MD Primary Gastroenterologist:  Dr. Vira Agar  Pre-Procedure History & Physical: HPI:  Katie Buckley is a 65 y.o. female is here for an endoscopy and colonoscopy.   Past Medical History  Diagnosis Date  . GERD (gastroesophageal reflux disease) 04/21/2015    History of Barrett's esophagus   . Heart murmur   . Seasonal allergies   . Diabetes mellitus without complication ([place])   . Depression   . Anxiety   . Skin cancer     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    Past Surgical History  Procedure Laterality Date  . Nasal sinus surgery  03/2013    Multile surgery Dr. Carlis Abbott. 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 Release of trigger finger Right 10/2010    Dr. Tamala Julian  . Carpal tunnel symdrome  2008    as repeated in 2009  . Cholecystectomy  2007  . Bone spur  2007    foot  . Tonsillectomy  1960  . Augmentation mammaplasty Bilateral     breast implants  . Mandible surgery      Prior to Admission medications   Medication Sig Start Date End Date Taking? Authorizing Provider  metoprolol-hydrochlorothiazide (LOPRESSOR HCT) 100-25 MG tablet TAKE 1/2 TABLET BY MOUTH 2 TIMES DAILY. **MUST MAKE AN APPOINTMENT** 04/26/15  Yes [name], MD  sertraline (ZOLOFT) 50 MG tablet Take 50 mg by mouth daily.   Yes Historical Provider, MD  amitriptyline (ELAVIL) 10 MG tablet TAKE UP TO 3 TABLETS BY MOUTH EVERY DAY AT BEDTIME 09/20/15   [name], MD  atorvastatin (LIPITOR) 80 MG tablet Take 1 tablet (80 mg total) by mouth daily. 05/20/15   [name], MD  butalbital-acetaminophen-caffeine (FIORICET, ESGIC) [phone] MG tablet TAKE ONE TABLET BY MOUTH EVERY 6 HOURS AS NEEDED FOR HEADACHE 11/01/15   [name], MD  Canagliflozin-Metformin HCl (INVOKAMET) [phone] MG TABS Take 1 tablet by mouth 2 (two) times daily. 10/27/15   [name], MD  esomeprazole (NEXIUM) 40 MG capsule Take 1 capsule (40 mg total) by mouth daily. 05/18/15    [name], MD  fluconazole (DIFLUCAN) 150 MG tablet Take 1 tablet (150 mg total) by mouth once. 10/18/15   [name], MD  fluticasone (FLONASE) 50 MCG/ACT nasal spray Place 2 sprays into both nostrils daily. 05/28/15   [name], MD  gabapentin (NEURONTIN) 300 MG capsule TAKE 2 CAPSULES BY MOUTH AT BEDTIME. 11/12/15   [name], MD  glipiZIDE (GLUCOTROL) 10 MG tablet TAKE 1 TABLET BY MOUTH DAILY BEFORE BREAKFAST. 06/03/15   [name], MD  Lancet Devices [place]     Historical Provider, MD  montelukast (SINGULAIR) 10 MG tablet TAKE 1 TABLET BY MOUTH DAILY 03/12/15   [name], MD  Olopatadine HCl (PATADAY) 0.2 % SOLN Apply to eye.    Historical Provider, MD  promethazine (PHENERGAN) 25 MG tablet Take by mouth. 11/18/13   Historical Provider, MD  valACYclovir (VALTREX) 1000 MG tablet Take by mouth. 05/13/14   Historical Provider, MD  valsartan-hydrochlorothiazide (DIOVAN-HCT) 320-12.5 MG tablet TAKE 1 TABLET BY MOUTH DAILY. 06/03/15   [name], MD  zolpidem (AMBIEN) 5 MG tablet Take 5 mg by mouth once.     Historical Provider, MD    Allergies as of 10/07/2015 - Review Complete 05/28/2015  Allergen Reaction Noted  . Biaxin  [clarithromycin]  12/16/2014    Family History  Problem Relation  Age of Onset  . Diabetes Father   . Breast cancer Maternal Aunt     Social History   Social History  . Marital Status: Married    Spouse Name: N/A  . Number of Children: N/A  . Years of Education: N/A   Occupational History  . Not on file.   Social History Main Topics  . Smoking status: Never Smoker   . Smokeless tobacco: Never Used  . Alcohol Use: No  . Drug Use: No  . Sexual Activity: Not on file   Other Topics Concern  . Not on file   Social History Narrative    Review of Systems: See HPI, otherwise negative ROS  Physical Exam: BP 151/60 mmHg  Pulse 59  Temp(Src) 97.1 F (36.2 C) (Tympanic)  Resp 18  Ht 5' (1.524 m)  Wt 71.668 kg (158 lb)   BMI 30.86 kg/m2  SpO2 98% General:   Alert,  pleasant and cooperative in NAD Head:  Normocephalic and atraumatic. Neck:  Supple; no masses or thyromegaly. Lungs:  Clear throughout to auscultation.    Heart:  Regular rate and rhythm. Abdomen:  Soft, nontender and nondistended. Normal bowel sounds, without guarding, and without rebound.   Neurologic:  Alert and  oriented x4;  grossly normal neurologically.  Impression/Plan: Katie Buckley is here for an endoscopy and colonoscopy to be performed for FH colon polyps and HX of Barretts esophagus  Risks, benefits, limitations, and alternatives regarding  endoscopy and colonoscopy have been reviewed with the patient.  Questions have been answered.  All parties agreeable.   Gaylyn Cheers, MD  11/17/2015, 7:40 AM

## 2015-11-17 NOTE — Op Note (Signed)
[facility] Gastroenterology Patient Name: Katie Buckley Procedure Date: 11/17/2015 7:46 AM MRN: [id number] Account #: [id number] Date of Birth: 09/20/1950 Admit Type: Outpatient Age: 65 Room: [facility] ENDO ROOM 4 Gender: Female Note Status: Finalized Procedure:            Upper GI endoscopy Indications:          Follow-up of Barrett's esophagus Providers:            Manya Silvas, MD Referring MD:         Kirstie Peri. Caryn Section, MD (Referring MD) Medicines:            Propofol per Anesthesia Complications:        No immediate complications. Procedure:            Pre-Anesthesia Assessment:                       - After reviewing the risks and benefits, the patient                        was deemed in satisfactory condition to undergo the                        procedure.                       After obtaining informed consent, the endoscope was                        passed under direct vision. Throughout the procedure,                        the patient's blood pressure, pulse, and oxygen                        saturations were monitored continuously. The                        Colonoscope was introduced through the mouth, and                        advanced to the second part of duodenum. The upper GI                        endoscopy was accomplished without difficulty. The                        patient tolerated the procedure well. Findings:      There were esophageal mucosal changes secondary to established       short-segment Barrett's disease present in the lower third of the       esophagus. The maximum longitudinal extent of these mucosal changes was       2 cm in length. Biopsies were taken with a cold forceps for histology.      Diffuse mild inflammation characterized by erythema and granularity was       found in the gastric antrum. Biopsies were taken with a cold forceps for       histology. Biopsies were taken with a cold forceps for Helicobacter   pylori testing. Biopsies taken in antrum and body.      The examined duodenum was normal. Impression:           -  Esophageal mucosal changes secondary to established                        short-segment Barrett's disease. Biopsied.                       - Gastritis. Biopsied.                       - Normal examined duodenum. Recommendation:       - Await pathology results. Manya Silvas, MD 11/17/2015 8:00:42 AM This report has been signed electronically. Number of Addenda: 0 Note Initiated On: 11/17/2015 7:46 AM      [facility]

## 2015-11-17 NOTE — Op Note (Signed)
[facility] Gastroenterology Patient Name: [place] Procedure Date: 11/17/2015 7:45 AM MRN: [id number] Account #: [id number] Date of Birth: Sep 26, 1950 Admit Type: Outpatient Age: 65 Room: [facility] ENDO ROOM 4 Gender: Female Note Status: Finalized Procedure:            Colonoscopy Indications:          Colon cancer screening in patient at increased risk:                        Family history of 1st-degree relative with colon polyps Providers:            Manya Silvas, MD Referring MD:         Kirstie Peri. Caryn Section, MD (Referring MD) Medicines:            Propofol per Anesthesia Complications:        No immediate complications. Procedure:            Pre-Anesthesia Assessment:                       - After reviewing the risks and benefits, the patient                        was deemed in satisfactory condition to undergo the                        procedure.                       After obtaining informed consent, the colonoscope was                        passed under direct vision. Throughout the procedure,                        the patient's blood pressure, pulse, and oxygen                        saturations were monitored continuously. The                        Colonoscope was introduced through the anus and                        advanced to the the cecum, identified by appendiceal                        orifice and ileocecal valve. The colonoscopy was                        performed without difficulty. The patient tolerated the                        procedure well. The quality of the bowel preparation                        was good. Findings:      The cecal mucosa looked adenomatous and I injected 5cc of saline and the       mucosa was shown to be hyperplastic and harmless.      Two sessile polyps were found in the descending colon, proximal  descending colon and proximal transverse colon. The polyps were       diminutive in size. These polyps were  removed with a jumbo cold forceps.       Resection and retrieval were complete.      A diminutive polyp was found in the cecum. The polyp was sessile. The       polyp was removed with a cold snare. Resection was complete, but the       polyp tissue was not retrieved.      Internal hemorrhoids were found during endoscopy. The hemorrhoids were       small and Grade I (internal hemorrhoids that do not prolapse). Impression:           - Two diminutive polyps in the descending colon, in the                        proximal descending colon and in the proximal                        transverse colon, removed with a jumbo cold forceps.                        Resected and retrieved.                       - One diminutive polyp in the cecum, removed with a                        cold snare. Complete resection. Polyp tissue not                        retrieved.                       - Internal hemorrhoids. Recommendation:       - Await pathology results. Manya Silvas, MD 11/17/2015 8:30:06 AM This report has been signed electronically. Number of Addenda: 0 Note Initiated On: 11/17/2015 7:45 AM Scope Withdrawal Time: 0 hours 19 minutes 15 seconds  Total Procedure Duration: 0 hours 23 minutes 27 seconds       [facility]

## 2015-11-17 NOTE — OR Nursing (Signed)
Cecum polyp area was injected with 10 ml NS by Nurse.

## 2015-11-17 NOTE — OR Nursing (Signed)
Cecum Polyp not retrieved

## 2015-11-17 NOTE — Anesthesia Preprocedure Evaluation (Signed)
Anesthesia Evaluation  Patient identified by MRN, date of birth, ID band Patient awake    Reviewed: Allergy & Precautions, H&P , NPO status , Patient's Chart, lab work & pertinent test results, reviewed documented beta blocker date and time   Airway Mallampati: III  TM Distance: <3 FB Neck ROM: full    Dental no notable dental hx. (+) Missing, Caps   Pulmonary neg pulmonary ROS,    Pulmonary exam normal breath sounds clear to auscultation       Cardiovascular Exercise Tolerance: Good hypertension, (-) angina(-) CAD, (-) Past MI, (-) Cardiac Stents and (-) CABG negative cardio ROS Normal cardiovascular exam(-) dysrhythmias + Valvular Problems/Murmurs  Rhythm:regular Rate:Normal     Neuro/Psych PSYCHIATRIC DISORDERS (Depression and anxiety) negative neurological ROS     GI/Hepatic Neg liver ROS, GERD  ,  Endo/Other  diabetes, Insulin Dependent  Renal/GU negative Renal ROS  negative genitourinary   Musculoskeletal   Abdominal   Peds  Hematology  (+) Blood dyscrasia, anemia ,   Anesthesia Other Findings Past Medical History:   GERD (gastroesophageal reflux disease)          04/21/2015      Comment:History of Barrett's esophagus    Heart murmur                                                 Seasonal allergies                                           Diabetes mellitus without complication ([facility])                 Depression                                                   Anxiety                                                      Skin cancer                                                    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   Reproductive/Obstetrics negative OB ROS                             Anesthesia Physical Anesthesia Plan  ASA: III  Anesthesia Plan: General   Post-op Pain Management:    Induction:   Airway Management Planned:   Additional Equipment:    Intra-op Plan:   Post-operative Plan:   Informed Consent: I have reviewed the patients History and Physical, chart, labs and discussed the procedure including the risks, benefits and alternatives for the proposed anesthesia with the patient or authorized representative who has indicated his/her  understanding and acceptance.   Dental Advisory Given  Plan Discussed with: Anesthesiologist, CRNA and Surgeon  Anesthesia Plan Comments:         Anesthesia Quick Evaluation

## 2015-11-18 ENCOUNTER — Encounter: Payer: Self-pay | Admitting: Unknown Physician Specialty

## 2015-11-19 LAB — SURGICAL PATHOLOGY

## 2015-11-29 DIAGNOSIS — F411 Generalized anxiety disorder: Secondary | ICD-10-CM | POA: Diagnosis not present

## 2015-11-29 DIAGNOSIS — F331 Major depressive disorder, recurrent, moderate: Secondary | ICD-10-CM | POA: Diagnosis not present

## 2015-12-01 DIAGNOSIS — F411 Generalized anxiety disorder: Secondary | ICD-10-CM | POA: Diagnosis not present

## 2015-12-08 DIAGNOSIS — F411 Generalized anxiety disorder: Secondary | ICD-10-CM | POA: Diagnosis not present

## 2015-12-13 ENCOUNTER — Other Ambulatory Visit: Payer: Self-pay | Admitting: Family Medicine

## 2015-12-13 DIAGNOSIS — E1165 Type 2 diabetes mellitus with hyperglycemia: Principal | ICD-10-CM

## 2015-12-13 DIAGNOSIS — IMO0002 Reserved for concepts with insufficient information to code with codable children: Secondary | ICD-10-CM

## 2015-12-13 DIAGNOSIS — E1140 Type 2 diabetes mellitus with diabetic neuropathy, unspecified: Secondary | ICD-10-CM

## 2015-12-20 ENCOUNTER — Other Ambulatory Visit: Payer: Self-pay | Admitting: Family Medicine

## 2015-12-20 DIAGNOSIS — K219 Gastro-esophageal reflux disease without esophagitis: Secondary | ICD-10-CM

## 2015-12-20 DIAGNOSIS — IMO0002 Reserved for concepts with insufficient information to code with codable children: Secondary | ICD-10-CM

## 2015-12-20 DIAGNOSIS — E1140 Type 2 diabetes mellitus with diabetic neuropathy, unspecified: Secondary | ICD-10-CM

## 2015-12-20 DIAGNOSIS — E1165 Type 2 diabetes mellitus with hyperglycemia: Secondary | ICD-10-CM

## 2015-12-23 DIAGNOSIS — F411 Generalized anxiety disorder: Secondary | ICD-10-CM | POA: Diagnosis not present

## 2015-12-27 ENCOUNTER — Other Ambulatory Visit: Payer: Self-pay | Admitting: Family Medicine

## 2016-01-05 DIAGNOSIS — F411 Generalized anxiety disorder: Secondary | ICD-10-CM | POA: Diagnosis not present

## 2016-01-12 DIAGNOSIS — F411 Generalized anxiety disorder: Secondary | ICD-10-CM | POA: Diagnosis not present

## 2016-01-17 ENCOUNTER — Other Ambulatory Visit: Payer: Self-pay | Admitting: Family Medicine

## 2016-01-17 DIAGNOSIS — E1140 Type 2 diabetes mellitus with diabetic neuropathy, unspecified: Secondary | ICD-10-CM

## 2016-01-17 DIAGNOSIS — IMO0002 Reserved for concepts with insufficient information to code with codable children: Secondary | ICD-10-CM

## 2016-01-17 DIAGNOSIS — E1165 Type 2 diabetes mellitus with hyperglycemia: Principal | ICD-10-CM

## 2016-01-19 DIAGNOSIS — F411 Generalized anxiety disorder: Secondary | ICD-10-CM | POA: Diagnosis not present

## 2016-01-19 DIAGNOSIS — F330 Major depressive disorder, recurrent, mild: Secondary | ICD-10-CM | POA: Diagnosis not present

## 2016-01-26 DIAGNOSIS — F411 Generalized anxiety disorder: Secondary | ICD-10-CM | POA: Diagnosis not present

## 2016-02-02 ENCOUNTER — Telehealth: Payer: Self-pay | Admitting: Family Medicine

## 2016-02-02 ENCOUNTER — Other Ambulatory Visit: Payer: Self-pay | Admitting: Family Medicine

## 2016-02-02 DIAGNOSIS — E1165 Type 2 diabetes mellitus with hyperglycemia: Principal | ICD-10-CM

## 2016-02-02 DIAGNOSIS — F411 Generalized anxiety disorder: Secondary | ICD-10-CM | POA: Diagnosis not present

## 2016-02-02 DIAGNOSIS — IMO0002 Reserved for concepts with insufficient information to code with codable children: Secondary | ICD-10-CM

## 2016-02-02 DIAGNOSIS — E1140 Type 2 diabetes mellitus with diabetic neuropathy, unspecified: Secondary | ICD-10-CM

## 2016-02-02 NOTE — Telephone Encounter (Signed)
Katie Buckley with [facility] is asking if he can file the Rx for INVOKAMET [phone] MG TABS for 21 days/increase by 42 pills due to pt has a co-pay coupon.  [id number]

## 2016-02-02 NOTE — Telephone Encounter (Signed)
Please advise 

## 2016-02-02 NOTE — Telephone Encounter (Signed)
Not unless the patient schedules a follow up office visit. She is non-compliant with follow up and has not been seen since December.

## 2016-02-03 NOTE — Telephone Encounter (Signed)
PA has been started for Invokamet [id number] mg bid

## 2016-02-03 NOTE — Telephone Encounter (Signed)
Patient scheduled ov appt for 02/17/2016.

## 2016-02-09 ENCOUNTER — Other Ambulatory Visit: Payer: Self-pay | Admitting: Family Medicine

## 2016-02-09 DIAGNOSIS — F411 Generalized anxiety disorder: Secondary | ICD-10-CM | POA: Diagnosis not present

## 2016-02-15 ENCOUNTER — Other Ambulatory Visit: Payer: Self-pay | Admitting: Family Medicine

## 2016-02-15 DIAGNOSIS — E1140 Type 2 diabetes mellitus with diabetic neuropathy, unspecified: Secondary | ICD-10-CM

## 2016-02-15 DIAGNOSIS — E1165 Type 2 diabetes mellitus with hyperglycemia: Principal | ICD-10-CM

## 2016-02-15 DIAGNOSIS — IMO0002 Reserved for concepts with insufficient information to code with codable children: Secondary | ICD-10-CM

## 2016-02-17 ENCOUNTER — Encounter: Payer: Self-pay | Admitting: Family Medicine

## 2016-02-17 ENCOUNTER — Ambulatory Visit (INDEPENDENT_AMBULATORY_CARE_PROVIDER_SITE_OTHER): Payer: 59 | Admitting: Family Medicine

## 2016-02-17 VITALS — BP 140/70 | HR 80 | Temp 98.7°F | Resp 16 | Ht 60.0 in | Wt 158.0 lb

## 2016-02-17 DIAGNOSIS — I10 Essential (primary) hypertension: Secondary | ICD-10-CM | POA: Diagnosis not present

## 2016-02-17 DIAGNOSIS — E1140 Type 2 diabetes mellitus with diabetic neuropathy, unspecified: Secondary | ICD-10-CM | POA: Diagnosis not present

## 2016-02-17 DIAGNOSIS — E782 Mixed hyperlipidemia: Secondary | ICD-10-CM | POA: Diagnosis not present

## 2016-02-17 DIAGNOSIS — Z23 Encounter for immunization: Secondary | ICD-10-CM

## 2016-02-17 DIAGNOSIS — K219 Gastro-esophageal reflux disease without esophagitis: Secondary | ICD-10-CM | POA: Diagnosis not present

## 2016-02-17 DIAGNOSIS — E1165 Type 2 diabetes mellitus with hyperglycemia: Secondary | ICD-10-CM

## 2016-02-17 DIAGNOSIS — IMO0002 Reserved for concepts with insufficient information to code with codable children: Secondary | ICD-10-CM

## 2016-02-17 LAB — POCT GLYCOSYLATED HEMOGLOBIN (HGB A1C)
ESTIMATED AVERAGE GLUCOSE: 237
Hemoglobin A1C: 9.9

## 2016-02-17 MED ORDER — ATORVASTATIN CALCIUM 80 MG PO TABS: 80.0000 mg | ORAL_TABLET | Freq: Every day | ORAL | 2 refills | Status: DC

## 2016-02-17 MED ORDER — ESOMEPRAZOLE MAGNESIUM 40 MG PO CPDR: DELAYED_RELEASE_CAPSULE | ORAL | 1 refills | Status: DC

## 2016-02-17 MED ORDER — METOPROLOL-HYDROCHLOROTHIAZIDE 100-25 MG PO TABS: ORAL_TABLET | ORAL | 3 refills | Status: DC

## 2016-02-17 MED ORDER — SERTRALINE HCL 50 MG PO TABS: 50.0000 mg | ORAL_TABLET | Freq: Every day | ORAL | 3 refills | Status: DC

## 2016-02-17 MED ORDER — VALACYCLOVIR HCL 1 G PO TABS: ORAL_TABLET | ORAL | 3 refills | Status: DC

## 2016-02-17 MED ORDER — EXENATIDE ER 2 MG ~~LOC~~ PEN: PEN_INJECTOR | SUBCUTANEOUS | 1 refills | Status: DC

## 2016-02-17 MED ORDER — GABAPENTIN 300 MG PO CAPS: 300.0000 mg | ORAL_CAPSULE | Freq: Two times a day (BID) | ORAL | 1 refills | Status: DC

## 2016-02-17 MED ORDER — GABAPENTIN 300 MG PO CAPS: 300.0000 mg | ORAL_CAPSULE | Freq: Two times a day (BID) | ORAL | 5 refills | Status: DC

## 2016-02-17 MED ORDER — CANAGLIFLOZIN-METFORMIN HCL 150-1000 MG PO TABS: 1.0000 | ORAL_TABLET | Freq: Two times a day (BID) | ORAL | 1 refills | Status: DC

## 2016-02-17 MED ORDER — VALSARTAN-HYDROCHLOROTHIAZIDE 320-12.5 MG PO TABS: 1.0000 | ORAL_TABLET | Freq: Every day | ORAL | 4 refills | Status: DC

## 2016-02-17 MED ORDER — GLIPIZIDE 10 MG PO TABS: 10.0000 mg | ORAL_TABLET | Freq: Every day | ORAL | 1 refills | Status: DC

## 2016-02-17 NOTE — Progress Notes (Signed)
Patient: Katie Buckley Female    DOB: 22-Jan-1951   65 y.o.   MRN: [id number] Visit Date: 02/17/2016  Today's Provider: Lelon Huh, MD   Chief Complaint  Patient presents with  . Follow-up  . Diabetes  . Hypertension  . Hyperlipidemia  . Anxiety   Subjective:    HPI    Diabetes Mellitus Type II, Follow-up:   Lab Results  Component Value Date   HGBA1C 9.9 02/17/2016   HGBA1C 8.8 04/23/2015   HGBA1C 8.1 (A) 07/22/2014   Last seen for diabetes 9 months ago.  Management since then includes; no changes. She reports good compliance with treatment. She is not having side effects. none Current symptoms include none and have been unchanged. Home blood sugar records: fasting range: 200  Episodes of hypoglycemia? no   Current Insulin Regimen: n/a Most Recent Eye Exam: 08/25/2015 Weight trend: stable Prior visit with dietician: no Current diet: well balanced Current exercise: walking  She states she has not been following diabetic diet weel due to being a stress eater. She hs been taking her medications consistently. She is having more trouble with stinging in her feet and would like to go up on gabapentin.  ----------------------------------------------------------------   Hypertension, follow-up:  BP Readings from Last 3 Encounters:  02/17/16 140/70  11/17/15 (!) 120/55  05/28/15 (!) 170/78    She was last seen for hypertension 10 months ago.  BP at that visit was 170/60. Management since that visit includes; Likely elevated today due to stress and anxiety. Will increase escitalopram as below and recheck BP.She reports good compliance with treatment. She is not having side effects. none She is exercising. She is not adherent to low salt diet.   Outside blood pressures are 138/84. She is experiencing none.  Patient denies none.   Cardiovascular risk factors include diabetes mellitus.  Use of agents associated with hypertension: none.    ----------------------------------------------------------------    Lipid/Cholesterol, Follow-up:   Last seen for this 10 months ago.  Management since that visit includes; increased atorvastatin to 80 mg qd.  Last Lipid Panel:    Component Value Date/Time   CHOL 360 (H) 05/18/2015 0845   CHOL 244 (H) 03/28/2013 0419   TRIG 355 (H) 05/18/2015 0845   TRIG 200 03/28/2013 0419   HDL 83 05/18/2015 0845   HDL 79 (H) 03/28/2013 0419   CHOLHDL 4.3 05/18/2015 0845   VLDL 40 03/28/2013 0419   LDLCALC 206 (H) 05/18/2015 0845   LDLCALC 125 (H) 03/28/2013 0419    She reports good compliance with treatment. She is not having side effects. none  Wt Readings from Last 3 Encounters:  02/17/16 158 lb (71.7 kg)  11/17/15 158 lb (71.7 kg)  05/28/15 161 lb (73 kg)    ----------------------------------------------------------------    Allergies  Allergen Reactions  . Biaxin  [Clarithromycin]     GI upset, and bad taste in mouth.     Current Outpatient Prescriptions:  .  amitriptyline (ELAVIL) 10 MG tablet, TAKE UP TO 3 TABLETS BY MOUTH EVERY DAY AT BEDTIME, Disp: 60 tablet, Rfl: 5 .  atorvastatin (LIPITOR) 80 MG tablet, TAKE 1 TABLET BY MOUTH DAILY., Disp: 30 tablet, Rfl: 3 .  butalbital-acetaminophen-caffeine (FIORICET, ESGIC) 50-325-40 MG tablet, TAKE ONE TABLET BY MOUTH EVERY 6 HOURS AS NEEDED FOR HEADACHE, Disp: 30 tablet, Rfl: 3 .  esomeprazole (NEXIUM) 40 MG capsule, TAKE 1 CAPSULE (40 MG TOTAL) BY MOUTH DAILY., Disp: 30 capsule, Rfl: 5 .  fluticasone (FLONASE) 50 MCG/ACT nasal spray, Place 2 sprays into both nostrils daily., Disp: 16 g, Rfl: 6 .  gabapentin (NEURONTIN) 300 MG capsule, TAKE 2 CAPSULES BY MOUTH AT BEDTIME., Disp: 60 capsule, Rfl: 11 .  glipiZIDE (GLUCOTROL) 10 MG tablet, Take 1 tablet (10 mg total) by mouth daily., Disp: 30 tablet, Rfl: 0 .  INVOKAMET [phone] MG TABS, TAKE 1 TABLET BY MOUTH TWO TIMES DAILY **MUST MAKE AN APPOINTMENT**, Disp: 30 tablet, Rfl:  0 .  Lancet Devices MISC, , Disp: , Rfl:  .  metoprolol-hydrochlorothiazide (LOPRESSOR HCT) 100-25 MG tablet, TAKE 1/2 TABLET BY MOUTH 2 TIMES DAILY. **MUST MAKE AN APPOINTMENT**, Disp: 30 tablet, Rfl: 12 .  montelukast (SINGULAIR) 10 MG tablet, TAKE 1 TABLET BY MOUTH DAILY, Disp: 30 tablet, Rfl: 6 .  Olopatadine HCl (PATADAY) 0.2 % SOLN, Apply to eye., Disp: , Rfl:  .  promethazine (PHENERGAN) 25 MG tablet, Take by mouth., Disp: , Rfl:  .  sertraline (ZOLOFT) 50 MG tablet, Take 50 mg by mouth daily., Disp: , Rfl:  .  valACYclovir (VALTREX) 1000 MG tablet, Take by mouth., Disp: , Rfl:  .  valsartan-hydrochlorothiazide (DIOVAN-HCT) 320-12.5 MG tablet, TAKE 1 TABLET BY MOUTH DAILY., Disp: 30 tablet, Rfl: 5 .  zolpidem (AMBIEN) 5 MG tablet, Take 5 mg by mouth once. , Disp: , Rfl:   Review of Systems  Constitutional: Negative for appetite change, chills, fatigue and fever.  Respiratory: Negative for chest tightness and shortness of breath.   Cardiovascular: Negative for chest pain and palpitations.  Gastrointestinal: Negative for abdominal pain, nausea and vomiting.  Neurological: Negative for dizziness and weakness.    Social History  Substance Use Topics  . Smoking status: Never Smoker  . Smokeless tobacco: Never Used  . Alcohol use No   Objective:   BP 140/70 (BP Location: Right Arm, Patient Position: Sitting, Cuff Size: Normal)   Pulse 80   Temp 98.7 F (37.1 C) (Oral)   Resp 16   Ht 5' (1.524 m)   Wt 158 lb (71.7 kg)   SpO2 96%   BMI 30.86 kg/m   Physical Exam  General Appearance:    Alert, cooperative, no distress, obese  Eyes:    PERRL, conjunctiva/corneas clear, EOM's intact       Lungs:     Clear to auscultation bilaterally, respirations unlabored  Heart:    Regular rate and rhythm  Neurologic:   Awake, alert, oriented x 3. No apparent focal neurological           defect.         Results for orders placed or performed in visit on 02/17/16  POCT glycosylated  hemoglobin (Hb A1C)  Result Value Ref Range   Hemoglobin A1C 9.9    Est. average glucose Bld gHb Est-mCnc 237        Assessment & Plan:     .1. Uncontrolled type 2 diabetes mellitus with diabetic neuropathy, without long-term current use of insulin (HCC) Worsening glycemic control and worsening neuropathy.Increase gabapentin. Add bydureon.Given first weekly injection and instructed on pen use in office today.  - POCT glycosylated hemoglobin (Hb A1C) - gabapentin (NEURONTIN) 300 MG capsule; Take 1-2 capsules (300-600 mg total) by mouth 2 (two) times daily.  Dispense: 360 capsule; Refill: 1 - Exenatide ER (BYDUREON) 2 MG PEN; One injection (2mg ) once every seven days  Dispense: 12 each; Refill: 1  2. Hyperlipidemia, mixed She is tolerating atorvastatin well with no adverse effects.    3. Essential  hypertension Stable. Continue current medications.  She may have run out of metoprolol-hctz, will send refill to [place].  .   4. Need for influenza vaccination  - Flu Vaccine QUAD 36+ mos IM  Return in about 3 months (around 05/18/2016).     The entirety of the information documented in the History of Present Illness, Review of Systems and Physical Exam were personally obtained by me. Portions of this information were initially documented by April M. Sabra Heck, CMA and reviewed by me for thoroughness and accuracy.    Lelon Huh, MD  [facility]

## 2016-03-08 ENCOUNTER — Other Ambulatory Visit: Payer: Self-pay | Admitting: Family Medicine

## 2016-03-15 DIAGNOSIS — J324 Chronic pansinusitis: Secondary | ICD-10-CM | POA: Diagnosis not present

## 2016-03-20 DIAGNOSIS — M65332 Trigger finger, left middle finger: Secondary | ICD-10-CM | POA: Diagnosis not present

## 2016-03-20 DIAGNOSIS — G4700 Insomnia, unspecified: Secondary | ICD-10-CM | POA: Diagnosis not present

## 2016-03-20 DIAGNOSIS — F411 Generalized anxiety disorder: Secondary | ICD-10-CM | POA: Diagnosis not present

## 2016-03-20 DIAGNOSIS — F331 Major depressive disorder, recurrent, moderate: Secondary | ICD-10-CM | POA: Diagnosis not present

## 2016-03-30 ENCOUNTER — Other Ambulatory Visit: Payer: Self-pay | Admitting: Family Medicine

## 2016-03-30 NOTE — Telephone Encounter (Signed)
Please call in Fioricet.  

## 2016-03-30 NOTE — Telephone Encounter (Signed)
Rx called in to pharmacy. 

## 2016-05-07 ENCOUNTER — Encounter: Payer: Self-pay | Admitting: Emergency Medicine

## 2016-05-07 ENCOUNTER — Emergency Department
Admission: EM | Admit: 2016-05-07 | Discharge: 2016-05-07 | Disposition: A | Discharge status: Home / Self Care | Payer: BLUE CROSS/BLUE SHIELD | Attending: Emergency Medicine | Admitting: Emergency Medicine

## 2016-05-07 ENCOUNTER — Emergency Department: Payer: BLUE CROSS/BLUE SHIELD

## 2016-05-07 DIAGNOSIS — M7551 Bursitis of right shoulder: Secondary | ICD-10-CM | POA: Diagnosis not present

## 2016-05-07 DIAGNOSIS — Z7984 Long term (current) use of oral hypoglycemic drugs: Secondary | ICD-10-CM | POA: Diagnosis not present

## 2016-05-07 DIAGNOSIS — J45909 Unspecified asthma, uncomplicated: Secondary | ICD-10-CM | POA: Insufficient documentation

## 2016-05-07 DIAGNOSIS — Z79899 Other long term (current) drug therapy: Secondary | ICD-10-CM | POA: Diagnosis not present

## 2016-05-07 DIAGNOSIS — Z85828 Personal history of other malignant neoplasm of skin: Secondary | ICD-10-CM | POA: Insufficient documentation

## 2016-05-07 DIAGNOSIS — E119 Type 2 diabetes mellitus without complications: Secondary | ICD-10-CM | POA: Insufficient documentation

## 2016-05-07 DIAGNOSIS — M25511 Pain in right shoulder: Secondary | ICD-10-CM | POA: Diagnosis present

## 2016-05-07 DIAGNOSIS — I10 Essential (primary) hypertension: Secondary | ICD-10-CM | POA: Insufficient documentation

## 2016-05-07 MED ORDER — MELOXICAM 15 MG PO TABS: 15.0000 mg | ORAL_TABLET | Freq: Every day | ORAL | 0 refills | Status: DC

## 2016-05-07 MED ORDER — KETOROLAC TROMETHAMINE 30 MG/ML IJ SOLN
30.0000 mg | Freq: Once | INTRAMUSCULAR | Status: AC
  Administered 2016-05-07: 30 mg via INTRAMUSCULAR
  Filled 2016-05-07: qty 1

## 2016-05-07 NOTE — ED Provider Notes (Signed)
[facility] Emergency Department Provider Note  ____________________________________________  Time seen: Approximately 12:23 PM  I have reviewed the triage vital signs and the nursing notes.   HISTORY  Chief Complaint Shoulder Pain (right)    HPI Katie Buckley is a 65 y.o. female , NAD, presents to the emergency department with 2 day history of right shoulder pain. States she woke with right upper arm pain 2 days ago. Pain has worsened over the last couple of days and now spreads to her upper shoulder and lower right arm. Denies any associated chest pain, shortness of breath, abdominal pain, nausea or vomiting. Has had no diaphoresis. Denies neck or back pain. Has taken over-the-counter medications with mild relief symptoms but no resolution. Denies any numbness, weakness, tingling. Has had no falls, injuries or traumas. Has not noted any rash, bruising, redness or swelling.   Past Medical History:  Diagnosis Date  . Anxiety   . Depression   . Diabetes mellitus without complication ([place])   . GERD (gastroesophageal reflux disease) 04/21/2015   History of Barrett's esophagus   . Heart murmur   . Seasonal allergies   . Skin cancer    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    Patient Active Problem List   Diagnosis Date Noted  . H/O adenomatous polyp of colon 08/20/2015  . Rupture of implant of right breast 05/18/2015  . Anxiety disorder 04/26/2015  . Allergic rhinitis 04/21/2015  . Anemia 04/21/2015  . Asthma 04/21/2015  . 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 Chronic infection of sinus 04/21/2015  . Depression 04/21/2015  . GERD (gastroesophageal reflux disease) 04/21/2015  . Headache 04/21/2015  . Menopausal symptoms 04/21/2015  . Obesity 04/21/2015  . Palpitations 04/21/2015  . Vitamin B12 deficiency 04/21/2015  . Aortic stenosis 01/21/2013  . Diastolic dysfunction [id number]  . Diabetes mellitus out of control ([place]) 06/12/2006  . Barrett  esophagus 06/13/2003  . Essential hypertension 06/12/1998  . Genital herpes 06/12/1998  . Hyperlipidemia, mixed 06/12/1998    Past Surgical History:  Procedure Laterality Date  . AUGMENTATION MAMMAPLASTY Bilateral    breast implants  . Bone Spur  2007   foot  . Carpal Tunnel Symdrome  2008   as repeated in 2009  . CHOLECYSTECTOMY  2007  . COLONOSCOPY WITH PROPOFOL N/A 11/17/2015   Procedure: COLONOSCOPY WITH PROPOFOL;  Surgeon: Manya Silvas, MD;  Location[facility] ENDOSCOPY;  Service: Endoscopy;  Laterality: N/A;  . ESOPHAGOGASTRODUODENOSCOPY (EGD) WITH PROPOFOL N/A 11/17/2015   Procedure: ESOPHAGOGASTRODUODENOSCOPY (EGD) WITH PROPOFOL;  Surgeon: Manya Silvas, MD;  Location[facility] ENDOSCOPY;  Service: Endoscopy;  Laterality: N/A;  . MANDIBLE SURGERY    . NASAL SINUS SURGERY  03/2013   Multile surgery Dr. Carlis Abbott. 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 Release of Trigger Finger Right 10/2010   Dr. Tamala Julian  . TONSILLECTOMY  1960    Prior to Admission medications   Medication Sig Start Date End Date Taking? Authorizing Provider  amitriptyline (ELAVIL) 10 MG tablet TAKE UP TO 3 TABLETS BY MOUTH EVERY DAY AT BEDTIME 02/09/16   [name], MD  atorvastatin (LIPITOR) 80 MG tablet Take 1 tablet (80 mg total) by mouth daily. 02/17/16   [name], MD  butalbital-acetaminophen-caffeine (FIORICET, ESGIC) [phone] MG tablet TAKE ONE TABLET BY MOUTH EVERY 6 HOURS AS NEEDED 03/30/16   [name], MD  Canagliflozin-Metformin HCl (INVOKAMET) [phone] MG TABS Take 1 tablet by mouth 2 (two) times daily. 02/17/16  [name], MD  esomeprazole (NEXIUM) 40 MG capsule TAKE 1 CAPSULE (40 MG TOTAL) BY MOUTH DAILY. 02/17/16   [name], MD  Exenatide ER (BYDUREON) 2 MG PEN One injection (2mg ) once every seven days 02/17/16   [name], MD  fluticasone [facility]) 50 MCG/ACT nasal spray Place 2 sprays into both nostrils daily. 05/28/15   [name], MD  gabapentin (NEURONTIN) 300 MG  capsule Take 1-2 capsules (300-600 mg total) by mouth 2 (two) times daily. 02/17/16   [name], MD  glipiZIDE (GLUCOTROL) 10 MG tablet Take 1 tablet (10 mg total) by mouth daily. 02/17/16   [name], MD  Lancet Devices [place]     Historical Provider, MD  meloxicam (MOBIC) 15 MG tablet Take 1 tablet (15 mg total) by mouth daily. 05/07/16   Darrion Wyszynski L Govani Radloff, PA-C  metoprolol-hydrochlorothiazide (LOPRESSOR HCT) 100-25 MG tablet Taken1/2 tablet daily 02/17/16   [name], MD  montelukast (SINGULAIR) 10 MG tablet TAKE 1 TABLET BY MOUTH DAILY 03/12/15   [name], MD  Olopatadine HCl (PATADAY) 0.2 % SOLN Apply to eye.    Historical Provider, MD  promethazine (PHENERGAN) 25 MG tablet Take by mouth. 11/18/13   Historical Provider, MD  sertraline (ZOLOFT) 50 MG tablet Take 1 tablet (50 mg total) by mouth daily. 02/17/16   [name], MD  valACYclovir (VALTREX) 1000 MG tablet Take 1 tablet daily for 5 days as needed 02/17/16   [name], MD  valsartan-hydrochlorothiazide (DIOVAN-HCT) 320-12.5 MG tablet Take 1 tablet by mouth daily. 02/17/16   [name], MD  zolpidem (AMBIEN) 5 MG tablet Take 5 mg by mouth once.     Historical Provider, MD    Allergies Biaxin  [clarithromycin]  Family History  Problem Relation Age of Onset  . Diabetes Father   . Breast cancer Maternal Aunt     Social History Social History  Substance Use Topics  . Smoking status: Never Smoker  . Smokeless tobacco: Never Used  . Alcohol use No     Review of Systems  Constitutional: No fatigue Cardiovascular: No chest pain. Respiratory: No shortness of breath.  Gastrointestinal: No abdominal pain.  No nausea, vomiting.   Musculoskeletal:Positive right shoulder pain. Negative for back, Neck pain.  Skin: Negative for rash, Redness, swelling, bruising, skin sores. Neurological: Negative for This, weakness, tingling. 10-point ROS otherwise  negative.  ____________________________________________   PHYSICAL EXAM:  VITAL SIGNS: ED Triage Vitals  Enc Vitals Group     BP 05/07/16 1130 (!) 148/76     Pulse Rate 05/07/16 1130 78     Resp --      Temp 05/07/16 1130 98.2 F (36.8 C)     Temp src --      SpO2 05/07/16 1130 97 %     Weight 05/07/16 1132 150 lb (68 kg)     Height 05/07/16 1132 5' (1.524 m)     Head Circumference --      Peak Flow --      Pain Score 05/07/16 1132 8     Pain Loc --      Pain Edu? --      Excl. in [place]? --      Constitutional: Alert and oriented. Well appearing and in no acute distress. Eyes: Conjunctivae are normal.  Head: Atraumatic. Neck: Supple with full range of motion Hematological/Lymphatic/Immunilogical: No cervical lymphadenopathy. Cardiovascular: Good peripheral circulation with 2+ pulses noted in the right upper  extremity. Capillary refill is brisk in all digits of the right hand. Respiratory: Normal respiratory effort without tachypnea or retractions.  Musculoskeletal: Decreased abduction to approximately 110 due to pain about the right lateral shoulder. Tenderness to palpation about the right deltoid and superior and lateral portion of the glenohumeral joint. No AC joint tenderness. No clavicular pain. No tenderness to palpation about the biceps and no deformity is noted. Strength in bilateral upper extremities is 5 out of 5 and equal. No lower extremity tenderness nor edema.  No joint effusions. Neurologic:  Normal speech and language. No gross focal neurologic deficits are appreciated. Sensation to light touch grossly intact bilaterally. Right upper extremity. Skin:  Skin is warm, dry and intact. No rash, redness, swelling, bruising, skin sores noted. Psychiatric: Mood and affect are normal. Speech and behavior are normal. Patient exhibits appropriate insight and judgement.   ____________________________________________    LABS  None ____________________________________________  EKG  None ____________________________________________  RADIOLOGY I, Braxton Feathers, personally viewed and evaluated these images (plain radiographs) as part of my medical decision making, as well as reviewing the written report by the radiologist.  Dg Shoulder Right  Result Date: 05/07/2016 CLINICAL DATA:  Right shoulder pain for 2 days, no known injury EXAM: RIGHT SHOULDER - 2+ VIEW COMPARISON:  None. FINDINGS: Four views of the right shoulder submitted. No acute fracture or subluxation. Moderate degenerative changes AC joint. Glenohumeral joint is preserved. IMPRESSION: No acute fracture or subluxation. Moderate degenerative changes AC joint. Electronically Signed   By: Lahoma Crocker M.D.   On: 05/07/2016 12:18    ____________________________________________    PROCEDURES  Procedure(s) performed: None   Procedures   Medications  ketorolac (TORADOL) 30 MG/ML injection 30 mg (30 mg Intramuscular Given 05/07/16 1230)     ____________________________________________   INITIAL IMPRESSION / ASSESSMENT AND PLAN / ED COURSE  Pertinent labs & imaging results that were available during my care of the patient were reviewed by me and considered in my medical decision making (see chart for details).  Clinical Course     Patient's diagnosis is consistent with Acute bursitis of the right shoulder. Patient tolerated Toradol IM while in the emergency department. Patient will be discharged home with prescriptions for meloxicam to take as directed. Patient is to follow up with her primary care provider or Dr. Sabra Heck in orthopedics if symptoms persist past this treatment course. Patient is given ED precautions to return to the ED for any worsening or new symptoms.    ____________________________________________  FINAL CLINICAL IMPRESSION(S) / ED DIAGNOSES  Final diagnoses:  Acute bursitis of right shoulder      NEW  MEDICATIONS STARTED DURING THIS VISIT:  Discharge Medication List as of 05/07/2016 12:53 PM    START taking these medications   Details  meloxicam (MOBIC) 15 MG tablet Take 1 tablet (15 mg total) by mouth daily., Starting Sun 05/07/2016, Print             Braxton Feathers, PA-C 05/07/16 1340    Lisa Roca, MD 05/07/16 1614

## 2016-05-07 NOTE — ED Notes (Signed)
See triage note  States she developed pain to right shoulder 2 days ago w/o injury  Pain radiates from shoulder into arm  Min relief with Ibu  No deformity noted

## 2016-05-07 NOTE — ED Triage Notes (Signed)
States bicep pain that goes into her shoulder and down her arm. Denies injury.

## 2016-05-18 ENCOUNTER — Ambulatory Visit: Payer: 59 | Admitting: Family Medicine

## 2016-05-19 ENCOUNTER — Other Ambulatory Visit: Payer: Self-pay

## 2016-05-19 MED ORDER — BUTALBITAL-APAP-CAFFEINE 50-325-40 MG PO TABS: 1.0000 | ORAL_TABLET | Freq: Four times a day (QID) | ORAL | 1 refills | Status: DC | PRN

## 2016-05-19 NOTE — Telephone Encounter (Signed)
Please call in Fioricet.  

## 2016-05-19 NOTE — Telephone Encounter (Signed)
RX called in-aa 

## 2016-05-19 NOTE — Telephone Encounter (Signed)
email from patient requesting Fioricet refill, [place] 02/17/16 and last fill was 03/30/16-aa

## 2016-05-26 ENCOUNTER — Telehealth: Payer: Self-pay | Admitting: Family Medicine

## 2016-05-26 NOTE — Telephone Encounter (Signed)
Pt stated that last week when she sent in the email asking for a refill on butalbital-acetaminophen-caffeine (FIORICET, ESGIC) 50-325-40 MG tablet, she request it be called into [facility] [address] because she had switched insurance and no longer uses [facility]. Pt would like [facility] if possible. Pt would also like the nurse to call her back when it has been called in. Please advise. Thanks TNP

## 2016-05-26 NOTE — Telephone Encounter (Signed)
RX called in to [place]. Called patient on number below and advised patient on her voicemail-aa

## 2016-06-12 DIAGNOSIS — Z22322 Carrier or suspected carrier of Methicillin resistant Staphylococcus aureus: Secondary | ICD-10-CM

## 2016-06-12 HISTORY — DX: Carrier or suspected carrier of methicillin resistant Staphylococcus aureus: Z22.322

## 2016-06-26 ENCOUNTER — Other Ambulatory Visit: Payer: Self-pay | Admitting: Family Medicine

## 2016-06-26 ENCOUNTER — Ambulatory Visit: Payer: 59 | Admitting: Family Medicine

## 2016-06-30 ENCOUNTER — Other Ambulatory Visit: Payer: Self-pay

## 2016-06-30 NOTE — Telephone Encounter (Signed)
Refill request from patient on Singulair. Please review-aa

## 2016-07-01 MED ORDER — MONTELUKAST SODIUM 10 MG PO TABS: 10.0000 mg | ORAL_TABLET | Freq: Every day | ORAL | 0 refills | Status: DC

## 2016-07-04 ENCOUNTER — Other Ambulatory Visit: Payer: Self-pay | Admitting: Family Medicine

## 2016-07-04 NOTE — Telephone Encounter (Signed)
Pharmacy requesting refills. Thanks!  

## 2016-07-08 ENCOUNTER — Other Ambulatory Visit: Payer: Self-pay | Admitting: Family Medicine

## 2016-07-08 DIAGNOSIS — E1165 Type 2 diabetes mellitus with hyperglycemia: Principal | ICD-10-CM

## 2016-07-08 DIAGNOSIS — IMO0002 Reserved for concepts with insufficient information to code with codable children: Secondary | ICD-10-CM

## 2016-07-08 DIAGNOSIS — E1140 Type 2 diabetes mellitus with diabetic neuropathy, unspecified: Secondary | ICD-10-CM

## 2016-07-13 LAB — LIPID PANEL
CHOLESTEROL: 303 mg/dL — AB (ref 0–200)
HDL: 76 mg/dL — AB (ref 35–70)
LDL CALC: 158 mg/dL
Triglycerides: 345 mg/dL — AB (ref 40–160)

## 2016-07-13 LAB — HEMOGLOBIN A1C: HEMOGLOBIN A1C: 8.5

## 2016-07-14 LAB — LIPID PANEL: VLDL: 69 mg/dL

## 2016-07-21 ENCOUNTER — Encounter: Payer: Self-pay | Admitting: Family Medicine

## 2016-07-26 ENCOUNTER — Other Ambulatory Visit: Payer: Self-pay | Admitting: Family Medicine

## 2016-07-27 ENCOUNTER — Encounter: Payer: Self-pay | Admitting: Physician Assistant

## 2016-07-27 ENCOUNTER — Ambulatory Visit (INDEPENDENT_AMBULATORY_CARE_PROVIDER_SITE_OTHER): Payer: BLUE CROSS/BLUE SHIELD | Admitting: Physician Assistant

## 2016-07-27 VITALS — BP 128/62 | HR 86 | Temp 99.1°F | Resp 16 | Wt 155.0 lb

## 2016-07-27 DIAGNOSIS — K529 Noninfective gastroenteritis and colitis, unspecified: Secondary | ICD-10-CM | POA: Diagnosis not present

## 2016-07-27 MED ORDER — PROMETHAZINE HCL 12.5 MG PO TABS: 12.5000 mg | ORAL_TABLET | Freq: Four times a day (QID) | ORAL | 0 refills | Status: DC | PRN

## 2016-07-27 MED ORDER — PROMETHAZINE HCL 25 MG/ML IJ SOLN: 25.0000 mg | Freq: Once | INTRAMUSCULAR | Status: DC

## 2016-07-27 NOTE — Patient Instructions (Signed)
Viral Gastroenteritis, Adult Introduction Viral gastroenteritis is also known as the stomach flu. This condition is caused by certain germs (viruses). These germs can be passed from person to person very easily (are very contagious). This condition can cause sudden watery poop (diarrhea), fever, and throwing up (vomiting). Having watery poop and throwing up can make you feel weak and cause you to get dehydrated. Dehydration can make you tired and thirsty, make you have a dry mouth, and make it so you pee (urinate) less often. Older adults and people with other diseases or a weak defense system (immune system) are at higher risk for dehydration. It is important to replace the fluids that you lose from having watery poop and throwing up. Follow these instructions at home: Follow instructions from your doctor about how to care for yourself at home. Eating and drinking Follow these instructions as told by your doctor:  Take an oral rehydration solution (ORS). This is a drink that is sold at pharmacies and stores.  Drink clear fluids in small amounts as you are able, such as:  Water.  Ice chips.  Diluted fruit juice.  Low-calorie sports drinks.  Eat bland, easy-to-digest foods in small amounts as you are able, such as:  Bananas.  Applesauce.  Rice.  Low-fat (lean) meats.  Toast.  Crackers.  Avoid fluids that have a lot of sugar or caffeine in them.  Avoid alcohol.  Avoid spicy or fatty foods. General instructions  Drink enough fluid to keep your pee (urine) clear or pale yellow.  Wash your hands often. If you cannot use soap and water, use hand sanitizer.  Make sure that all people in your home wash their hands well and often.  Rest at home while you get better.  Take over-the-counter and prescription medicines only as told by your doctor.  Watch your condition for any changes.  Take a warm bath to help with any burning or pain from having watery poop.  Keep all  follow-up visits as told by your doctor. This is important. Contact a doctor if:  You cannot keep fluids down.  Your symptoms get worse.  You have new symptoms.  You feel light-headed or dizzy.  You have muscle cramps. Get help right away if:  You have chest pain.  You feel very weak or you pass out (faint).  You see blood in your throw-up.  Your throw-up looks like coffee grounds.  You have bloody or black poop (stools) or poop that look like tar.  You have a very bad headache, a stiff neck, or both.  You have a rash.  You have very bad pain, cramping, or bloating in your belly (abdomen).  You have trouble breathing.  You are breathing very quickly.  Your heart is beating very quickly.  Your skin feels cold and clammy.  You feel confused.  You have pain when you pee.  You have signs of dehydration, such as:  Dark pee, hardly any pee, or no pee.  Cracked lips.  Dry mouth.  Sunken eyes.  Sleepiness.  Weakness. This information is not intended to replace advice given to you by your health care provider. Make sure you discuss any questions you have with your health care provider. Document Released: 11/15/2007 Document Revised: 12/17/2015 Document Reviewed: 02/02/2015  2017 Elsevier  

## 2016-07-27 NOTE — Progress Notes (Signed)
Patient: Katie Buckley Female    DOB: Apr 25, 1951   66 y.o.   MRN: [id number] Visit Date: 07/27/2016  Today's Provider: Trinna Post, PA-C   Chief Complaint  Patient presents with  . URI  . Diarrhea   Subjective:    URI   This is a new problem. The current episode started in the past 7 days (2 days ago). The problem has been gradually worsening. There has been no fever (has felt feverish). Associated symptoms include abdominal pain, congestion, coughing, diarrhea, headaches, nausea, a plugged ear sensation, rhinorrhea and swollen glands. She has tried antihistamine and increased fluids for the symptoms. The treatment provided mild relief.  Diarrhea   This is a new problem. The current episode started yesterday. The problem has been gradually worsening. Associated symptoms include abdominal pain, chills, coughing, headaches and a URI. She has tried increased fluids for the symptoms.   Patient works in [employer], reports there were people recovering from norovirus.    Allergies  Allergen Reactions  . Biaxin  [Clarithromycin]     GI upset, and bad taste in mouth.     Current Outpatient Prescriptions:  .  amitriptyline (ELAVIL) 10 MG tablet, TAKE UP TO 3 TABLETS BY MOUTH EVERY DAY AT BEDTIME, Disp: 60 tablet, Rfl: 0 .  atorvastatin (LIPITOR) 80 MG tablet, Take 1 tablet (80 mg total) by mouth daily., Disp: 90 tablet, Rfl: 2 .  butalbital-acetaminophen-caffeine (FIORICET, ESGIC) 50-325-40 MG tablet, Take 1 tablet by mouth every 6 (six) hours as needed., Disp: 30 tablet, Rfl: 1 .  Canagliflozin-Metformin HCl (INVOKAMET) [phone] MG TABS, Take 1 tablet by mouth 2 (two) times daily., Disp: 90 tablet, Rfl: 1 .  esomeprazole (NEXIUM) 40 MG capsule, TAKE 1 CAPSULE (40 MG TOTAL) BY MOUTH DAILY., Disp: 90 capsule, Rfl: 1 .  Exenatide ER (BYDUREON) 2 MG PEN, One injection (2mg ) once every seven days, Disp: 12 each, Rfl: 1 .  fluconazole (DIFLUCAN) 150 MG tablet, TAKE 1 TABLET BY MOUTH  ONCE, Disp: 1 tablet, Rfl: 1 .  fluticasone (FLONASE) 50 MCG/ACT nasal spray, Place 2 sprays into both nostrils daily., Disp: 16 g, Rfl: 6 .  gabapentin (NEURONTIN) 300 MG capsule, Take 1-2 capsules (300-600 mg total) by mouth 2 (two) times daily., Disp: 360 capsule, Rfl: 1 .  glipiZIDE (GLUCOTROL) 10 MG tablet, TAKE 1 TABLET BY MOUTH DAILY, Disp: 30 tablet, Rfl: 0 .  Lancet Devices MISC, , Disp: , Rfl:  .  meloxicam (MOBIC) 15 MG tablet, Take 1 tablet (15 mg total) by mouth daily., Disp: 30 tablet, Rfl: 0 .  metoprolol-hydrochlorothiazide (LOPRESSOR HCT) 100-25 MG tablet, Taken1/2 tablet daily, Disp: 90 tablet, Rfl: 3 .  montelukast (SINGULAIR) 10 MG tablet, Take 1 tablet (10 mg total) by mouth daily., Disp: 30 tablet, Rfl: 0 .  Olopatadine HCl (PATADAY) 0.2 % SOLN, Apply to eye., Disp: , Rfl:  .  promethazine (PHENERGAN) 25 MG tablet, Take by mouth., Disp: , Rfl:  .  sertraline (ZOLOFT) 50 MG tablet, Take 1 tablet (50 mg total) by mouth daily., Disp: 90 tablet, Rfl: 3 .  valACYclovir (VALTREX) 1000 MG tablet, Take 1 tablet daily for 5 days as needed, Disp: 30 tablet, Rfl: 3 .  valsartan-hydrochlorothiazide (DIOVAN-HCT) 320-12.5 MG tablet, Take 1 tablet by mouth daily., Disp: 90 tablet, Rfl: 4 .  zolpidem (AMBIEN) 5 MG tablet, Take 5 mg by mouth once. , Disp: , Rfl:   Review of Systems  Constitutional: Positive for chills.  HENT: Positive for congestion and rhinorrhea.   Respiratory: Positive for cough.   Gastrointestinal: Positive for abdominal pain, diarrhea and nausea.  Neurological: Positive for headaches.    Social History  Substance Use Topics  . Smoking status: Never Smoker  . Smokeless tobacco: Never Used  . Alcohol use No   Objective:   BP 128/62 (BP Location: Left Arm, Patient Position: Sitting, Cuff Size: Normal)   Pulse 86   Temp 99.1 F (37.3 C)   Resp 16   Wt 155 lb (70.3 kg)   SpO2 96%   BMI 30.27 kg/m   Physical Exam  Constitutional: She appears  well-developed and well-nourished. No distress.  HENT:  Right Ear: External ear normal.  Left Ear: External ear normal.  Mouth/Throat: Oropharynx is clear and moist. No oropharyngeal exudate.  Eyes: Conjunctivae are normal. Right eye exhibits discharge. Left eye exhibits discharge.  Watery discharge  Neck: Neck supple.  Cardiovascular: Normal rate and regular rhythm.   Pulmonary/Chest: Effort normal and breath sounds normal.  Abdominal: Soft. Bowel sounds are normal. She exhibits no distension and no mass. There is no tenderness. There is no rebound and no guarding.  Lymphadenopathy:    She has no cervical adenopathy.  Skin: Skin is warm and dry.  Psychiatric: She has a normal mood and affect. Her behavior is normal.        Assessment & Plan:     1. Gastroenteritis  Phenergan in office. Husband will drive her home. Treat symptomatically with antiemetics and adequate hydration. Counseled on return precautions.  - promethazine (PHENERGAN) injection 25 mg; Inject 1 mL (25 mg total) into the muscle once. - promethazine (PHENERGAN) 12.5 MG tablet; Take 1 tablet (12.5 mg total) by mouth every 6 (six) hours as needed for nausea or vomiting.  Dispense: 30 tablet; Refill: 0  Return if symptoms worsen or fail to improve.   Patient Instructions  Viral Gastroenteritis, Adult Introduction Viral gastroenteritis is also known as the stomach flu. This condition is caused by certain germs (viruses). These germs can be passed from person to person very easily (are very contagious). This condition can cause sudden watery poop (diarrhea), fever, and throwing up (vomiting). Having watery poop and throwing up can make you feel weak and cause you to get dehydrated. Dehydration can make you tired and thirsty, make you have a dry mouth, and make it so you pee (urinate) less often. Older adults and people with other diseases or a weak defense system (immune system) are at higher risk for dehydration. It is  important to replace the fluids that you lose from having watery poop and throwing up. Follow these instructions at home: Follow instructions from your doctor about how to care for yourself at home. Eating and drinking Follow these instructions as told by your doctor:  Take an oral rehydration solution (ORS). This is a drink that is sold at pharmacies and stores.  Drink clear fluids in small amounts as you are able, such as:  Water.  Ice chips.  Diluted fruit juice.  Low-calorie sports drinks.  Eat bland, easy-to-digest foods in small amounts as you are able, such as:  Bananas.  Applesauce.  Rice.  Low-fat (lean) meats.  Toast.  Crackers.  Avoid fluids that have a lot of sugar or caffeine in them.  Avoid alcohol.  Avoid spicy or fatty foods. General instructions  Drink enough fluid to keep your pee (urine) clear or pale yellow.  Wash your hands often. If you cannot use soap  and water, use hand sanitizer.  Make sure that all people in your home wash their hands well and often.  Rest at home while you get better.  Take over-the-counter and prescription medicines only as told by your doctor.  Watch your condition for any changes.  Take a warm bath to help with any burning or pain from having watery poop.  Keep all follow-up visits as told by your doctor. This is important. Contact a doctor if:  You cannot keep fluids down.  Your symptoms get worse.  You have new symptoms.  You feel light-headed or dizzy.  You have muscle cramps. Get help right away if:  You have chest pain.  You feel very weak or you pass out (faint).  You see blood in your throw-up.  Your throw-up looks like coffee grounds.  You have bloody or black poop (stools) or poop that look like tar.  You have a very bad headache, a stiff neck, or both.  You have a rash.  You have very bad pain, cramping, or bloating in your belly (abdomen).  You have trouble breathing.  You  are breathing very quickly.  Your heart is beating very quickly.  Your skin feels cold and clammy.  You feel confused.  You have pain when you pee.  You have signs of dehydration, such as:  Dark pee, hardly any pee, or no pee.  Cracked lips.  Dry mouth.  Sunken eyes.  Sleepiness.  Weakness. This information is not intended to replace advice given to you by your health care provider. Make sure you discuss any questions you have with your health care provider. Document Released: 11/15/2007 Document Revised: 12/17/2015 Document Reviewed: 02/02/2015  2017 Elsevier    The entirety of the information documented in the History of Present Illness, Review of Systems and Physical Exam were personally obtained by me. Portions of this information were initially documented by [facility] and reviewed by me for thoroughness and accuracy.           Trinna Post, PA-C  [facility]

## 2016-07-28 ENCOUNTER — Ambulatory Visit: Payer: 59 | Admitting: Family Medicine

## 2016-07-30 ENCOUNTER — Other Ambulatory Visit: Payer: Self-pay | Admitting: Family Medicine

## 2016-08-03 ENCOUNTER — Emergency Department: Payer: BLUE CROSS/BLUE SHIELD

## 2016-08-03 ENCOUNTER — Encounter: Payer: Self-pay | Admitting: Emergency Medicine

## 2016-08-03 ENCOUNTER — Telehealth: Payer: Self-pay | Admitting: *Deleted

## 2016-08-03 ENCOUNTER — Emergency Department
Admission: EM | Admit: 2016-08-03 | Discharge: 2016-08-03 | Disposition: A | Discharge status: Home / Self Care | Payer: BLUE CROSS/BLUE SHIELD | Attending: Emergency Medicine | Admitting: Emergency Medicine

## 2016-08-03 DIAGNOSIS — R197 Diarrhea, unspecified: Secondary | ICD-10-CM | POA: Diagnosis not present

## 2016-08-03 DIAGNOSIS — M791 Myalgia: Secondary | ICD-10-CM | POA: Diagnosis not present

## 2016-08-03 DIAGNOSIS — R51 Headache: Secondary | ICD-10-CM | POA: Insufficient documentation

## 2016-08-03 DIAGNOSIS — Z0389 Encounter for observation for other suspected diseases and conditions ruled out: Secondary | ICD-10-CM | POA: Diagnosis not present

## 2016-08-03 DIAGNOSIS — K831 Obstruction of bile duct: Secondary | ICD-10-CM | POA: Diagnosis not present

## 2016-08-03 DIAGNOSIS — Z7984 Long term (current) use of oral hypoglycemic drugs: Secondary | ICD-10-CM | POA: Insufficient documentation

## 2016-08-03 DIAGNOSIS — E119 Type 2 diabetes mellitus without complications: Secondary | ICD-10-CM | POA: Insufficient documentation

## 2016-08-03 DIAGNOSIS — R52 Pain, unspecified: Secondary | ICD-10-CM

## 2016-08-03 DIAGNOSIS — R935 Abnormal findings on diagnostic imaging of other abdominal regions, including retroperitoneum: Secondary | ICD-10-CM | POA: Diagnosis not present

## 2016-08-03 DIAGNOSIS — Z79899 Other long term (current) drug therapy: Secondary | ICD-10-CM | POA: Insufficient documentation

## 2016-08-03 DIAGNOSIS — I10 Essential (primary) hypertension: Secondary | ICD-10-CM | POA: Insufficient documentation

## 2016-08-03 DIAGNOSIS — R519 Headache, unspecified: Secondary | ICD-10-CM

## 2016-08-03 LAB — COMPREHENSIVE METABOLIC PANEL
ALT: 26 U/L (ref 14–54)
ANION GAP: 10 (ref 5–15)
AST: 30 U/L (ref 15–41)
Albumin: 4.5 g/dL (ref 3.5–5.0)
Alkaline Phosphatase: 81 U/L (ref 38–126)
BILIRUBIN TOTAL: 0.3 mg/dL (ref 0.3–1.2)
BUN: 15 mg/dL (ref 6–20)
CALCIUM: 9.7 mg/dL (ref 8.9–10.3)
CO2: 29 mmol/L (ref 22–32)
Chloride: 98 mmol/L — ABNORMAL LOW (ref 101–111)
Creatinine, Ser: 0.57 mg/dL (ref 0.44–1.00)
GFR calc Af Amer: >60 mL/min (ref >60)
Glucose, Bld: 120 mg/dL — ABNORMAL HIGH (ref 65–99)
POTASSIUM: 3.4 mmol/L — AB (ref 3.5–5.1)
Sodium: 137 mmol/L (ref 135–145)
TOTAL PROTEIN: 7.3 g/dL (ref 6.5–8.1)

## 2016-08-03 LAB — CBC
HEMATOCRIT: 37.2 % (ref 35.0–47.0)
Hemoglobin: 12.5 g/dL (ref 12.0–16.0)
MCH: 26.8 pg (ref 26.0–34.0)
MCHC: 33.6 g/dL (ref 32.0–36.0)
MCV: 79.7 fL — ABNORMAL LOW (ref 80.0–100.0)
Platelets: 242 10*3/uL (ref 150–440)
RBC: 4.66 MIL/uL (ref 3.80–5.20)
RDW: 14 % (ref 11.5–14.5)
WBC: 7.4 10*3/uL (ref 3.6–11.0)

## 2016-08-03 LAB — URINALYSIS, COMPLETE (UACMP) WITH MICROSCOPIC
BILIRUBIN URINE: NEGATIVE
Bacteria, UA: NONE SEEN
HGB URINE DIPSTICK: NEGATIVE
KETONES UR: NEGATIVE mg/dL
LEUKOCYTES UA: NEGATIVE
NITRITE: NEGATIVE
PH: 6 (ref 5.0–8.0)
Protein, ur: NEGATIVE mg/dL
Specific Gravity, Urine: 1.003 — ABNORMAL LOW (ref 1.005–1.030)

## 2016-08-03 LAB — LIPASE, BLOOD: Lipase: 72 U/L — ABNORMAL HIGH (ref 11–51)

## 2016-08-03 LAB — TROPONIN I: Troponin I: <0.03 ng/mL (ref <0.03)

## 2016-08-03 MED ORDER — KETOROLAC TROMETHAMINE 30 MG/ML IJ SOLN
30.0000 mg | Freq: Once | INTRAMUSCULAR | Status: AC
  Administered 2016-08-03: 30 mg via INTRAVENOUS

## 2016-08-03 MED ORDER — MORPHINE SULFATE (PF) 4 MG/ML IV SOLN
4.0000 mg | Freq: Once | INTRAVENOUS | Status: AC
  Administered 2016-08-03: 4 mg via INTRAVENOUS
  Filled 2016-08-03: qty 1

## 2016-08-03 MED ORDER — KETOROLAC TROMETHAMINE 30 MG/ML IJ SOLN
INTRAMUSCULAR | Status: AC
  Administered 2016-08-03: 30 mg via INTRAVENOUS
  Filled 2016-08-03: qty 1

## 2016-08-03 MED ORDER — ONDANSETRON HCL 4 MG PO TABS: 4.0000 mg | ORAL_TABLET | Freq: Three times a day (TID) | ORAL | 0 refills | Status: DC | PRN

## 2016-08-03 MED ORDER — IOPAMIDOL (ISOVUE-300) INJECTION 61%
100.0000 mL | Freq: Once | INTRAVENOUS | Status: AC | PRN
  Administered 2016-08-03: 100 mL via INTRAVENOUS

## 2016-08-03 MED ORDER — SODIUM CHLORIDE 0.9 % IV BOLUS (SEPSIS)
1000.0000 mL | Freq: Once | INTRAVENOUS | Status: AC
  Administered 2016-08-03: 1000 mL via INTRAVENOUS

## 2016-08-03 MED ORDER — ONDANSETRON HCL 4 MG/2ML IJ SOLN
4.0000 mg | Freq: Once | INTRAMUSCULAR | Status: AC
  Administered 2016-08-03: 4 mg via INTRAVENOUS
  Filled 2016-08-03: qty 2

## 2016-08-03 MED ORDER — IOPAMIDOL (ISOVUE-300) INJECTION 61%
30.0000 mL | Freq: Once | INTRAVENOUS | Status: AC | PRN
  Administered 2016-08-03: 30 mL via ORAL

## 2016-08-03 NOTE — ED Notes (Signed)

## 2016-08-03 NOTE — ED Notes (Signed)
MD Lord at bedside at this time  

## 2016-08-03 NOTE — ED Provider Notes (Signed)
[facility] Emergency Department Provider Note ____________________________________________   I have reviewed the triage vital signs and the triage nursing note.  HISTORY  Chief Complaint Nausea and Diarrhea   Historian Patient and husband  HPI Katie Buckley is a 66 y.o. female reports about 1 week of watery diarrhea. She actually was feeling somewhat better and then started feeling worse today including return of diarrhea. She's also had nausea without vomiting. Today she developed global gradual onset headache which she still has. She has had headaches before, but today she also felt like she was "spacey" like she wasn't sure how she got where she was, but without focal weakness or numbness or trouble with words or slurring speech.  No chest pain or trouble breathing. No black or bloody stools.    Past Medical History:  Diagnosis Date  . Anxiety   . Depression   . Diabetes mellitus without complication ([place])   . GERD (gastroesophageal reflux disease) 04/21/2015   History of Barrett's esophagus   . Heart murmur   . Seasonal allergies   . Skin cancer    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    Patient Active Problem List   Diagnosis Date Noted  . H/O adenomatous polyp of colon 08/20/2015  . Rupture of implant of right breast 05/18/2015  . Anxiety disorder 04/26/2015  . Allergic rhinitis 04/21/2015  . Anemia 04/21/2015  . Asthma 04/21/2015  . 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 Chronic infection of sinus 04/21/2015  . Depression 04/21/2015  . GERD (gastroesophageal reflux disease) 04/21/2015  . Headache 04/21/2015  . Menopausal symptoms 04/21/2015  . Obesity 04/21/2015  . Palpitations 04/21/2015  . Vitamin B12 deficiency 04/21/2015  . Aortic stenosis 01/21/2013  . Diastolic dysfunction [id number]  . Diabetes mellitus out of control ([place]) 06/12/2006  . Barrett esophagus 06/13/2003  . Essential hypertension 06/12/1998  . Genital herpes  06/12/1998  . Hyperlipidemia, mixed 06/12/1998    Past Surgical History:  Procedure Laterality Date  . AUGMENTATION MAMMAPLASTY Bilateral    breast implants  . Bone Spur  2007   foot  . Carpal Tunnel Symdrome  2008   as repeated in 2009  . CHOLECYSTECTOMY  2007  . COLONOSCOPY WITH PROPOFOL N/A 11/17/2015   Procedure: COLONOSCOPY WITH PROPOFOL;  Surgeon: Manya Silvas, MD;  Location: [facility] ENDOSCOPY;  Service: Endoscopy;  Laterality: N/A;  . ESOPHAGOGASTRODUODENOSCOPY (EGD) WITH PROPOFOL N/A 11/17/2015   Procedure: ESOPHAGOGASTRODUODENOSCOPY (EGD) WITH PROPOFOL;  Surgeon: Manya Silvas, MD;  Location: [facility] ENDOSCOPY;  Service: Endoscopy;  Laterality: N/A;  . MANDIBLE SURGERY    . NASAL SINUS SURGERY  03/2013   Multile surgery Dr. Carlis Abbott. 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 Release of Trigger Finger Right 10/2010   Dr. Tamala Julian  . TONSILLECTOMY  1960    Prior to Admission medications   Medication Sig Start Date End Date Taking? Authorizing Provider  amitriptyline (ELAVIL) 10 MG tablet TAKE UP TO 3 TABLETS BY MOUTH EVERY DAY AT BEDTIME 07/27/16   [name], MD  atorvastatin (LIPITOR) 80 MG tablet Take 1 tablet (80 mg total) by mouth daily. 02/17/16   [name], MD  butalbital-acetaminophen-caffeine (FIORICET, ESGIC) [phone] MG tablet Take 1 tablet by mouth every 6 (six) hours as needed. 05/19/16   [name], MD  Canagliflozin-Metformin HCl (INVOKAMET) [phone] MG TABS Take 1 tablet by mouth 2 (two) times daily. 02/17/16   [name], MD  esomeprazole (NEXIUM) 40 MG capsule TAKE  1 CAPSULE (40 MG TOTAL) BY MOUTH DAILY. 02/17/16   [name], MD  Exenatide ER (BYDUREON) 2 MG PEN One injection (2mg ) once every seven days 02/17/16   [name], MD  fluconazole (DIFLUCAN) 150 MG tablet TAKE 1 TABLET BY MOUTH ONCE Patient not taking: Reported on 07/27/2016 06/26/16   [name], MD  fluticasone [facility]) 50 MCG/ACT nasal spray Place 2 sprays into both nostrils  daily. 05/28/15   [name], MD  gabapentin (NEURONTIN) 300 MG capsule Take 1-2 capsules (300-600 mg total) by mouth 2 (two) times daily. 02/17/16   [name], MD  glipiZIDE (GLUCOTROL) 10 MG tablet TAKE 1 TABLET BY MOUTH DAILY 07/08/16   [name], MD  Lancet Devices MISC     Historical Provider, MD  meloxicam (MOBIC) 15 MG tablet Take 1 tablet (15 mg total) by mouth daily. 05/07/16   Jami L Hagler, PA-C  metoprolol-hydrochlorothiazide (LOPRESSOR HCT) 100-25 MG tablet Taken1/2 tablet daily 02/17/16   [name], MD  montelukast (SINGULAIR) 10 MG tablet TAKE 1 TABLET (10 MG TOTAL) BY MOUTH DAILY. 07/30/16   [name], MD  Olopatadine HCl (PATADAY) 0.2 % SOLN Apply to eye.    Historical Provider, MD  ondansetron (ZOFRAN) 4 MG tablet Take 1 tablet (4 mg total) by mouth every 8 (eight) hours as needed for nausea or vomiting. 08/03/16   Lisa Roca, MD  promethazine (PHENERGAN) 12.5 MG tablet Take 1 tablet (12.5 mg total) by mouth every 6 (six) hours as needed for nausea or vomiting. 07/27/16   Trinna Post, PA-C  sertraline (ZOLOFT) 50 MG tablet Take 1 tablet (50 mg total) by mouth daily. 02/17/16   [name], MD  valACYclovir (VALTREX) 1000 MG tablet Take 1 tablet daily for 5 days as needed 02/17/16   [name], MD  valsartan-hydrochlorothiazide (DIOVAN-HCT) 320-12.5 MG tablet Take 1 tablet by mouth daily. 02/17/16   [name], MD  zolpidem (AMBIEN) 5 MG tablet Take 5 mg by mouth once.     Historical Provider, MD    Allergies  Allergen Reactions  . Biaxin  [Clarithromycin]     GI upset, and bad taste in mouth.    Family History  Problem Relation Age of Onset  . Diabetes Father   . Breast cancer Maternal Aunt     Social History Social History  Substance Use Topics  . Smoking status: Never Smoker  . Smokeless tobacco: Never Used  . Alcohol use No    Review of Systems  Constitutional: Negative for fever. Eyes: Negative for visual changes. ENT:  Negative for sore throat. Cardiovascular: Negative for chest pain. Respiratory: Negative for shortness of breath. Gastrointestinal: Positive as per hpi Genitourinary: Negative for dysuria. Musculoskeletal: Mild body aches Skin: Negative for rash. Neurological: Positive for mild global headache. 10 point Review of Systems otherwise negative ____________________________________________   PHYSICAL EXAM:  VITAL SIGNS: ED Triage Vitals  Enc Vitals Group     BP 08/03/16 1547 (!) 168/61     Pulse Rate 08/03/16 1547 75     Resp 08/03/16 1545 16     Temp 08/03/16 1545 98.4 F (36.9 C)     Temp Source 08/03/16 1545 Oral     SpO2 08/03/16 1547 98 %     Weight 08/03/16 1546 155 lb (70.3 kg)     Height 08/03/16 1546 5' (1.524 m)     Head Circumference --      Peak Flow --  Pain Score 08/03/16 1546 0     Pain Loc --      Pain Edu? --      Excl. in [place]? --      Constitutional: Alert and oriented. Well appearing and in no distress. HEENT   Head: Normocephalic and atraumatic.      Eyes: Conjunctivae are normal. PERRL. Normal extraocular movements.      Ears:         Nose: No congestion/rhinnorhea.   Mouth/Throat: Mucous membranes are mildly dry.   Neck: No stridor. Cardiovascular/Chest: Normal rate, regular rhythm.  No murmurs, rubs, or gallops. Respiratory: Normal respiratory effort without tachypnea nor retractions. Breath sounds are clear and equal bilaterally. No wheezes/rales/rhonchi. Gastrointestinal: Soft. No distention, no guarding, no rebound. Nontender.  Genitourinary/rectal:Deferred Musculoskeletal: Nontender with normal range of motion in all extremities. No joint effusions.  No lower extremity tenderness.  No edema. Neurologic:  Normal speech and language. No gross or focal neurologic deficits are appreciated. Skin:  Skin is warm, dry and intact. No rash noted. Psychiatric: Mood and affect are normal. Speech and behavior are normal. Patient exhibits  appropriate insight and judgment.   ____________________________________________  LABS (pertinent positives/negatives)  Labs Reviewed  LIPASE, BLOOD - Abnormal; Notable for the following:       Result Value   Lipase 72 (*)    All other components within normal limits  COMPREHENSIVE METABOLIC PANEL - Abnormal; Notable for the following:    Potassium 3.4 (*)    Chloride 98 (*)    Glucose, Bld 120 (*)    All other components within normal limits  CBC - Abnormal; Notable for the following:    MCV 79.7 (*)    All other components within normal limits  URINALYSIS, COMPLETE (UACMP) WITH MICROSCOPIC - Abnormal; Notable for the following:    Color, Urine COLORLESS (*)    APPearance CLEAR (*)    Specific Gravity, Urine 1.003 (*)    Glucose, UA >=500 (*)    Squamous Epithelial / LPF 0-5 (*)    All other components within normal limits  TROPONIN I    ____________________________________________    EKG I, Lisa Roca, MD, the attending physician have personally viewed and interpreted all ECGs.  87 bpm. Normal sinus rhythm. Narrow QRS normal axis. Nonspecific ST and T-wave. Q wave anteriorly ____________________________________________  RADIOLOGY All Xrays were viewed by me. Imaging interpreted by Radiologist.  CT abdomen pelvis with contrast: IMPRESSION: 1. No CT evidence for acute intra-abdominopelvic process. 2. Punctate nonobstructive right renal calculus. 3. Status post cholecystectomy.  CT head without contrast:  IMPRESSION: 1. No acute intracranial process. 2. Mild age-related cerebral atrophy with chronic small vessel ischemic disease.  __________________________________________  PROCEDURES  Procedure(s) performed: None  Critical Care performed: None  ____________________________________________   ED COURSE / ASSESSMENT AND PLAN  Pertinent labs & imaging results that were available during my care of the patient were reviewed by me and considered in my  medical decision making (see chart for details).   Ms. Headlee is here for diarrhea 1 week, she got she was getting a little better, and then started to worsen again. Worsening symptoms today are fatigue and body aches, as well as headache, as well as mid abdominal pain.  Laboratory studies she does have a slightly elevated lipase, I discussed with her to go ahead and CT abdomen. CT the abdomen does not show any acute acute intra-abdominal emergencies.  We had also discussed obtaining CT head because of the headache and  feeling of spaciness or dizziness, although she does not have any focal neurologic deficits on exam.  CT head shows no acute findings.  Patient received morphine and Zofran her abdominal pain was somewhat improved but states she still having a headache.  She is going to receive Toradol and IV fluids.  We discussed that although my suspicion for acute stroke would be extremely low, CT scan might notice less than 24 hours of symptoms, and if she is not improving, we could try to obtain MRI in the ED.  Again my suspicion is that she is probably experiencing body aches related to a viral syndrome. In total she is overall well-appearing, and I think okay for close follow-up with her primary care doctor, Dr. Caryn Section.  Reevaluation and around 9 PM, patient feels improved with regard to headache, body ache, and abdominal discomfort. We will go ahead and discharge him from the emergency department now.   CONSULTATIONS:   None   Patient / Family / Caregiver informed of clinical course, medical decision-making process, and agree with plan.   I discussed return precautions, follow-up instructions, and discharge instructions with patient and/or family.   ___________________________________________   FINAL CLINICAL IMPRESSION(S) / ED DIAGNOSES   Final diagnoses:  Diarrhea, unspecified type  Body aches  Acute nonintractable headache, unspecified headache type               Note: This dictation was prepared with Dragon dictation. Any transcriptional errors that result from this process are unintentional    Lisa Roca, MD 08/03/16 2120

## 2016-08-03 NOTE — Telephone Encounter (Signed)
Patient called office stating that she feels like she is dehydrated. Patient saw Adrianna 07/27/2016 with stomach virus. Patient has had diarrhea since before 07/27/2016. Patient stated that she has headache, fatigue, and decreased urine. Advise pt to go to ER. Patient stated that she will go to ER now.

## 2016-08-03 NOTE — ED Triage Notes (Signed)
C/O nausea, diarrhea x 1 week.  Today feeling "spacey" and lethargic.  Called PCP today, who referred to ED for possible fluids.  Patient states she had been diagnosed with "norovirus / the stomach bug" last Thursday.

## 2016-08-03 NOTE — Discharge Instructions (Signed)
You were evaluated for nausea and diarrhea along with body aches and headache, and as we discussed, use and evaluation are reassuring in the emergency department today.  Return to the emergency department immediately for any worsening symptoms including black or bloody stool, fever, chest pain, weakness or numbness, confusion or altered mental status.

## 2016-08-03 NOTE — Telephone Encounter (Signed)
[id number] 

## 2016-08-03 NOTE — ED Notes (Signed)
Pt reports feeling better at this time. 200 ml of bolus left to run at this time.

## 2016-08-05 ENCOUNTER — Other Ambulatory Visit: Payer: Self-pay | Admitting: Family Medicine

## 2016-08-05 DIAGNOSIS — E1140 Type 2 diabetes mellitus with diabetic neuropathy, unspecified: Secondary | ICD-10-CM

## 2016-08-05 DIAGNOSIS — IMO0002 Reserved for concepts with insufficient information to code with codable children: Secondary | ICD-10-CM

## 2016-08-05 DIAGNOSIS — E1165 Type 2 diabetes mellitus with hyperglycemia: Principal | ICD-10-CM

## 2016-08-12 ENCOUNTER — Other Ambulatory Visit: Payer: Self-pay | Admitting: Family Medicine

## 2016-08-18 ENCOUNTER — Telehealth: Payer: Self-pay

## 2016-08-18 NOTE — Telephone Encounter (Signed)
Patient called and asked for Dr Sabino Snipes nurse to call her back. I tried to get details but she would not tell me. Please call. CB [phone]

## 2016-08-18 NOTE — Telephone Encounter (Signed)
Called and spoke with patient. She needed to schedule a follow up appointment with Dr. Caryn Section. Was in the ER on 08/03/2016 and was told to follow up with PCP. Appointment has been scheduled for Tuesday 08/22/2016 at 8:15am. Patient states she plans to arrive at 8am and would like to be checked in as soon as possible so that she can get back to work.

## 2016-08-22 ENCOUNTER — Ambulatory Visit: Payer: Self-pay | Admitting: Family Medicine

## 2016-08-28 ENCOUNTER — Other Ambulatory Visit: Payer: Self-pay | Admitting: Family Medicine

## 2016-08-28 DIAGNOSIS — IMO0002 Reserved for concepts with insufficient information to code with codable children: Secondary | ICD-10-CM

## 2016-08-28 DIAGNOSIS — E1140 Type 2 diabetes mellitus with diabetic neuropathy, unspecified: Secondary | ICD-10-CM

## 2016-08-28 DIAGNOSIS — E1165 Type 2 diabetes mellitus with hyperglycemia: Principal | ICD-10-CM

## 2016-08-28 MED ORDER — BUTALBITAL-APAP-CAFFEINE 50-325-40 MG PO TABS: 1.0000 | ORAL_TABLET | Freq: Four times a day (QID) | ORAL | 0 refills | Status: DC | PRN

## 2016-08-28 NOTE — Telephone Encounter (Signed)
Please call in Fioricet.  

## 2016-08-28 NOTE — Telephone Encounter (Signed)
Called in Rx as below.  

## 2016-08-29 ENCOUNTER — Encounter: Payer: Self-pay | Admitting: Family Medicine

## 2016-08-29 ENCOUNTER — Ambulatory Visit (INDEPENDENT_AMBULATORY_CARE_PROVIDER_SITE_OTHER): Payer: BLUE CROSS/BLUE SHIELD | Admitting: Family Medicine

## 2016-08-29 VITALS — BP 132/64 | Temp 98.9°F | Resp 16 | Wt 155.0 lb

## 2016-08-29 DIAGNOSIS — K219 Gastro-esophageal reflux disease without esophagitis: Secondary | ICD-10-CM

## 2016-08-29 DIAGNOSIS — R197 Diarrhea, unspecified: Secondary | ICD-10-CM | POA: Diagnosis not present

## 2016-08-29 DIAGNOSIS — R1013 Epigastric pain: Secondary | ICD-10-CM | POA: Diagnosis not present

## 2016-08-29 DIAGNOSIS — R110 Nausea: Secondary | ICD-10-CM

## 2016-08-29 DIAGNOSIS — I10 Essential (primary) hypertension: Secondary | ICD-10-CM | POA: Diagnosis not present

## 2016-08-29 DIAGNOSIS — E782 Mixed hyperlipidemia: Secondary | ICD-10-CM | POA: Diagnosis not present

## 2016-08-29 DIAGNOSIS — E538 Deficiency of other specified B group vitamins: Secondary | ICD-10-CM

## 2016-08-29 MED ORDER — ONDANSETRON HCL 4 MG PO TABS: 4.0000 mg | ORAL_TABLET | Freq: Three times a day (TID) | ORAL | 1 refills | Status: DC | PRN

## 2016-08-29 NOTE — Progress Notes (Signed)
Patient: Katie Buckley Female    DOB: Oct 03, 1950   66 y.o.   MRN: [id number] Visit Date: 08/29/2016  Today's Provider: Lelon Huh, MD   Chief Complaint  Patient presents with  . Hospitalization Follow-up   Subjective:    HPI  Follow Up ER Visit  Patient is here for ER follow up.  She was recently seen at [facility] for weakness, fatigue, and diarrhea on 08/03/2016. She had been having diarrhea for a few weeks. She had mildly elevated lipase at 72.  Treatment for this included IV fluids and Zofran.  She reports good compliance with treatment. She reports this condition is Improved.   Patient reports that her symptoms have improved. However, she reports that she still has the nausea that comes and goes. Patient reports that she does not have any more Zofran tablets, and reports that this helped her symptoms. Having BM every 1-2 days, but are still loose. Also having some epigastric pain that gets worse when she eats. Has been taking OTC pro-biotics. Is also on Nexium daily for reflux which is working well.    Results for orders placed or performed during the hospital encounter of 08/03/16  Lipase, blood  Result Value Ref Range   Lipase 72 (H) 11 - 51 U/L  Comprehensive metabolic panel  Result Value Ref Range   Sodium 137 135 - 145 mmol/L   Potassium 3.4 (L) 3.5 - 5.1 mmol/L   Chloride 98 (L) 101 - 111 mmol/L   CO2 29 22 - 32 mmol/L   Glucose, Bld 120 (H) 65 - 99 mg/dL   BUN 15 6 - 20 mg/dL   Creatinine, Ser 0.57 0.44 - 1.00 mg/dL   Calcium 9.7 8.9 - 10.3 mg/dL   Total Protein 7.3 6.5 - 8.1 g/dL   Albumin 4.5 3.5 - 5.0 g/dL   AST 30 15 - 41 U/L   ALT 26 14 - 54 U/L   Alkaline Phosphatase 81 38 - 126 U/L   Total Bilirubin 0.3 0.3 - 1.2 mg/dL   GFR calc non Af Amer >60 >60 mL/min   GFR calc Af Amer >60 >60 mL/min   Anion gap 10 5 - 15  CBC  Result Value Ref Range   WBC 7.4 3.6 - 11.0 K/uL   RBC 4.66 3.80 - 5.20 MIL/uL   Hemoglobin 12.5 12.0 - 16.0 g/dL   HCT  37.2 35.0 - 47.0 %   MCV 79.7 (L) 80.0 - 100.0 fL   MCH 26.8 26.0 - 34.0 pg   MCHC 33.6 32.0 - 36.0 g/dL   RDW 14.0 11.5 - 14.5 %   Platelets 242 150 - 440 K/uL  Urinalysis, Complete w Microscopic  Result Value Ref Range   Color, Urine COLORLESS (A) YELLOW   APPearance CLEAR (A) CLEAR   Specific Gravity, Urine 1.003 (L) 1.005 - 1.030   pH 6.0 5.0 - 8.0   Glucose, UA >=500 (A) NEGATIVE mg/dL   Hgb urine dipstick NEGATIVE NEGATIVE   Bilirubin Urine NEGATIVE NEGATIVE   Ketones, ur NEGATIVE NEGATIVE mg/dL   Protein, ur NEGATIVE NEGATIVE mg/dL   Nitrite NEGATIVE NEGATIVE   Leukocytes, UA NEGATIVE NEGATIVE   RBC / HPF 0-5 0 - 5 RBC/hpf   WBC, UA 0-5 0 - 5 WBC/hpf   Bacteria, UA NONE SEEN NONE SEEN   Squamous Epithelial / LPF 0-5 (A) NONE SEEN  Troponin I  Result Value Ref Range   Troponin I <0.03 <0.03 ng/mL  Follow up diabetes Lab Results  Component Value Date   HGBA1C 8.5 07/13/2016  Has been eating very little since she has been having stomach problems above. She is now taking INvokamet, glipizide and bydureon consistently, although she hadn't been when labs checked  By Dr. Nicolasa Ducking in February.    Follow up hyperlipidemia Lab Results  Component Value Date   CHOL 303 (A) 07/13/2016   HDL 76 (A) 07/13/2016   LDLCALC 158 07/13/2016   TRIG 345 (A) 07/13/2016   CHOLHDL 4.3 05/18/2015    States that she hadn't been taking atorvastatin regularly when she had lipids check by Dr. Nicolasa Ducking 07/13/16, but is taking consistently now.   She also reports she has been having a lot of nasal congestion worse on left the last couple of weeks. Has had some dark yellow and occasional green discharge.      Allergies  Allergen Reactions  . Biaxin  [Clarithromycin]     GI upset, and bad taste in mouth.     Current Outpatient Prescriptions:  .  amitriptyline (ELAVIL) 10 MG tablet, TAKE UP TO 3 TABLETS BY MOUTH EVERY DAY AT BEDTIME, Disp: 60 tablet, Rfl: 5 .  atorvastatin (LIPITOR) 80 MG  tablet, Take 1 tablet (80 mg total) by mouth daily., Disp: 90 tablet, Rfl: 2 .  butalbital-acetaminophen-caffeine (FIORICET, ESGIC) 50-325-40 MG tablet, Take 1 tablet by mouth every 6 (six) hours as needed., Disp: 30 tablet, Rfl: 0 .  Canagliflozin-Metformin HCl (INVOKAMET) [phone] MG TABS, Take 1 tablet by mouth 2 (two) times daily., Disp: 90 tablet, Rfl: 1 .  esomeprazole (NEXIUM) 40 MG capsule, TAKE 1 CAPSULE (40 MG TOTAL) BY MOUTH DAILY., Disp: 90 capsule, Rfl: 1 .  Exenatide ER (BYDUREON) 2 MG PEN, One injection (2mg ) once every seven days, Disp: 12 each, Rfl: 1 .  fluticasone (FLONASE) 50 MCG/ACT nasal spray, Place 2 sprays into both nostrils daily., Disp: 16 g, Rfl: 6 .  gabapentin (NEURONTIN) 300 MG capsule, TAKE 1-2 CAPSULES BY MOUTH 2 TIMES A DAY, Disp: 360 capsule, Rfl: 0 .  glipiZIDE (GLUCOTROL) 10 MG tablet, TAKE 1 TABLET BY MOUTH DAILY, Disp: 30 tablet, Rfl: 0 .  Lancet Devices MISC, , Disp: , Rfl:  .  metoprolol-hydrochlorothiazide (LOPRESSOR HCT) 100-25 MG tablet, Taken1/2 tablet daily, Disp: 90 tablet, Rfl: 3 .  montelukast (SINGULAIR) 10 MG tablet, TAKE 1 TABLET (10 MG TOTAL) BY MOUTH DAILY., Disp: 30 tablet, Rfl: 0 .  Olopatadine HCl (PATADAY) 0.2 % SOLN, Apply to eye., Disp: , Rfl:  .  ondansetron (ZOFRAN) 4 MG tablet, Take 1 tablet (4 mg total) by mouth every 8 (eight) hours as needed for nausea or vomiting., Disp: 10 tablet, Rfl: 0 .  sertraline (ZOLOFT) 50 MG tablet, Take 1 tablet (50 mg total) by mouth daily., Disp: 90 tablet, Rfl: 3 .  valACYclovir (VALTREX) 1000 MG tablet, Take 1 tablet daily for 5 days as needed, Disp: 30 tablet, Rfl: 3 .  valsartan-hydrochlorothiazide (DIOVAN-HCT) 320-12.5 MG tablet, Take 1 tablet by mouth daily., Disp: 90 tablet, Rfl: 4 .  zolpidem (AMBIEN) 5 MG tablet, Take 5 mg by mouth once. , Disp: , Rfl:  .  fluconazole (DIFLUCAN) 150 MG tablet, TAKE 1 TABLET BY MOUTH ONCE (Patient not taking: Reported on 07/27/2016), Disp: 1 tablet, Rfl: 1 .   meloxicam (MOBIC) 15 MG tablet, Take 1 tablet (15 mg total) by mouth daily. (Patient not taking: Reported on 08/29/2016), Disp: 30 tablet, Rfl: 0 .  promethazine (PHENERGAN) 12.5 MG tablet, Take  1 tablet (12.5 mg total) by mouth every 6 (six) hours as needed for nausea or vomiting. (Patient not taking: Reported on 08/29/2016), Disp: 30 tablet, Rfl: 0  Current Facility-Administered Medications:  .  promethazine (PHENERGAN) injection 25 mg, 25 mg, Intramuscular, Once, Trinna Post, PA-C  Review of Systems  Constitutional: Positive for fatigue.  Respiratory: Negative.   Cardiovascular: Negative.   Gastrointestinal: Positive for nausea. Negative for abdominal pain, diarrhea and vomiting.  Genitourinary: Negative.   Neurological: Positive for headaches.    Social History  Substance Use Topics  . Smoking status: Never Smoker  . Smokeless tobacco: Never Used  . Alcohol use No   Objective:   BP 132/64 (BP Location: Right Arm, Patient Position: Sitting, Cuff Size: Normal)   Temp 98.9 F (37.2 C)   Resp 16   Wt 155 lb (70.3 kg)   BMI 30.27 kg/m     Physical Exam  General Appearance:    Alert, cooperative, no distress  Eyes:    PERRL, conjunctiva/corneas clear, EOM's intact       HEEN:  Moderate congestion nasal turbinates, L>R. No sinus tenderness.   Lungs:     Clear to auscultation bilaterally, respirations unlabored  Heart:    Regular rate and rhythm  Abdomen:   bowel sounds present and normal in all 4 quadrants, soft, round or mild epigastric tenderness. Marland Kitchen No CVA tenderness        Assessment & Plan:     1. Diarrhea, unspecified type Improved, but not resolved. She is to continue on Align. She likely started with GI virus which is slowly resolve, although she may have developed diabetic gastropathy.  - Comprehensive metabolic panel  2. Nausea Did well with prn zofran prescribed from ER - ondansetron (ZOFRAN) 4 MG tablet; Take 1 tablet (4 mg total) by mouth every 8  (eight) hours as needed for nausea or vomiting.  Dispense: 10 tablet; Refill: 1  3. Epigastric pain Normal abdominal CT at ER last month.  - Comprehensive metabolic panel - Amylase - Lipase - H Pylori, IGM, IGG, IGA AB  4. Essential hypertension Well controlled.  Continue current medications.    5. Hyperlipidemia, mixed Is now back on consistent dose of atorvastatin. Will recheck lipids at follow up.   6. Vitamin B12 deficiency  - Vitamin B12  7. Gastroesophageal reflux disease without esophagitis Symptomatic controlled on Nexium.  - Magnesium  8. Nasal congestion She may have early infection, but I advised against taking antibiotics at this time due to ongoing diarrhea. She will start on Q2-3 hours nasal saline and call if not improving within a week.    Addressed extensive list of chronic and acute medical problems today requiring extensive time in counseling and coordination care.  Over half of this 45 minute visit were spent in counseling and coordinating care of multiple medical problems.     The entirety of the information documented in the History of Present Illness, Review of Systems and Physical Exam were personally obtained by me. Portions of this information were initially documented by Wilburt Finlay, CMA and reviewed by me for thoroughness and accuracy.    Lelon Huh, MD  [facility]

## 2016-08-31 ENCOUNTER — Telehealth: Payer: Self-pay

## 2016-08-31 LAB — H PYLORI, IGM, IGG, IGA AB
H pylori, IgM Abs: <9 units (ref 0.0–8.9)
H. pylori, IgA Abs: <9 units (ref 0.0–8.9)

## 2016-08-31 LAB — COMPREHENSIVE METABOLIC PANEL
ALK PHOS: 91 IU/L (ref 39–117)
ALT: 21 IU/L (ref 0–32)
AST: 20 IU/L (ref 0–40)
Albumin/Globulin Ratio: 2.3 — ABNORMAL HIGH (ref 1.2–2.2)
Albumin: 4.5 g/dL (ref 3.6–4.8)
BUN/Creatinine Ratio: 33 — ABNORMAL HIGH (ref 12–28)
BUN: 18 mg/dL (ref 8–27)
CALCIUM: 9.6 mg/dL (ref 8.7–10.3)
CHLORIDE: 100 mmol/L (ref 96–106)
CO2: 24 mmol/L (ref 18–29)
Creatinine, Ser: 0.55 mg/dL — ABNORMAL LOW (ref 0.57–1.00)
GFR calc Af Amer: 114 mL/min/{1.73_m2} (ref >59)
GFR, EST NON AFRICAN AMERICAN: 99 mL/min/{1.73_m2} (ref >59)
GLOBULIN, TOTAL: 2 g/dL (ref 1.5–4.5)
Glucose: 196 mg/dL — ABNORMAL HIGH (ref 65–99)
POTASSIUM: 4.2 mmol/L (ref 3.5–5.2)
SODIUM: 141 mmol/L (ref 134–144)
Total Protein: 6.5 g/dL (ref 6.0–8.5)

## 2016-08-31 LAB — VITAMIN B12: Vitamin B-12: 622 pg/mL (ref 232–1245)

## 2016-08-31 LAB — AMYLASE: AMYLASE: 74 U/L (ref 31–124)

## 2016-08-31 LAB — LIPASE: Lipase: 75 U/L — ABNORMAL HIGH (ref 14–72)

## 2016-08-31 LAB — MAGNESIUM: MAGNESIUM: 2.2 mg/dL (ref 1.6–2.3)

## 2016-08-31 NOTE — Telephone Encounter (Signed)
Patient advised as directed below. Patient reports feeling way better.  Thanks,  -Giavonni Cizek

## 2016-08-31 NOTE — Telephone Encounter (Signed)
-----   Message from [name], MD sent at 08/31/2016  7:49 AM EDT ----- Labs are all normal. She most likely had a viral GI infection which should be resolving. If not continuing to improving will need referral to gastroenterology.

## 2016-09-25 ENCOUNTER — Other Ambulatory Visit: Payer: Self-pay | Admitting: Family Medicine

## 2016-10-06 ENCOUNTER — Other Ambulatory Visit: Payer: Self-pay | Admitting: Family Medicine

## 2016-10-10 ENCOUNTER — Encounter: Payer: Self-pay | Admitting: Emergency Medicine

## 2016-10-10 ENCOUNTER — Emergency Department
Admission: EM | Admit: 2016-10-10 | Discharge: 2016-10-10 | Disposition: A | Discharge status: Home / Self Care | Payer: BLUE CROSS/BLUE SHIELD | Attending: Emergency Medicine | Admitting: Emergency Medicine

## 2016-10-10 DIAGNOSIS — E119 Type 2 diabetes mellitus without complications: Secondary | ICD-10-CM | POA: Insufficient documentation

## 2016-10-10 DIAGNOSIS — T814XXA Infection following a procedure, initial encounter: Secondary | ICD-10-CM | POA: Insufficient documentation

## 2016-10-10 DIAGNOSIS — Y828 Other medical devices associated with adverse incidents: Secondary | ICD-10-CM | POA: Insufficient documentation

## 2016-10-10 DIAGNOSIS — Z79899 Other long term (current) drug therapy: Secondary | ICD-10-CM | POA: Insufficient documentation

## 2016-10-10 DIAGNOSIS — Z7984 Long term (current) use of oral hypoglycemic drugs: Secondary | ICD-10-CM | POA: Insufficient documentation

## 2016-10-10 DIAGNOSIS — I10 Essential (primary) hypertension: Secondary | ICD-10-CM | POA: Diagnosis not present

## 2016-10-10 DIAGNOSIS — Z85828 Personal history of other malignant neoplasm of skin: Secondary | ICD-10-CM | POA: Diagnosis not present

## 2016-10-10 DIAGNOSIS — T148XXA Other injury of unspecified body region, initial encounter: Secondary | ICD-10-CM

## 2016-10-10 DIAGNOSIS — L089 Local infection of the skin and subcutaneous tissue, unspecified: Secondary | ICD-10-CM | POA: Diagnosis not present

## 2016-10-10 DIAGNOSIS — J45909 Unspecified asthma, uncomplicated: Secondary | ICD-10-CM | POA: Insufficient documentation

## 2016-10-10 MED ORDER — CLINDAMYCIN PHOSPHATE 600 MG/50ML IV SOLN
600.0000 mg | Freq: Once | INTRAVENOUS | Status: AC
  Administered 2016-10-10: 600 mg via INTRAVENOUS
  Filled 2016-10-10: qty 50

## 2016-10-10 MED ORDER — SULFAMETHOXAZOLE-TRIMETHOPRIM 800-160 MG PO TABS: 1.0000 | ORAL_TABLET | Freq: Two times a day (BID) | ORAL | 0 refills | Status: DC

## 2016-10-10 MED ORDER — HYDROCODONE-ACETAMINOPHEN 5-325 MG PO TABS: 1.0000 | ORAL_TABLET | Freq: Four times a day (QID) | ORAL | 0 refills | Status: DC | PRN

## 2016-10-10 MED ORDER — OXYCODONE-ACETAMINOPHEN 5-325 MG PO TABS
1.0000 | ORAL_TABLET | Freq: Once | ORAL | Status: AC
  Administered 2016-10-10: 1 via ORAL
  Filled 2016-10-10: qty 1

## 2016-10-10 MED ORDER — CEPHALEXIN 500 MG PO CAPS: 500.0000 mg | ORAL_CAPSULE | Freq: Four times a day (QID) | ORAL | 0 refills | Status: AC

## 2016-10-10 NOTE — ED Provider Notes (Signed)
[facility] Emergency Department Provider Note  ____________________________________________  Time seen: Approximately 7:59 AM  I have reviewed the triage vital signs and the nursing notes.   HISTORY  Chief Complaint Hand Pain    HPI Katie Buckley is a 66 y.o. female that presents to emergency department with purulent drainage and erythema around incision site from trigger release 1 week ago. Patient states that she is having pain around incision site. Hand feels swollen. Patient made an appointment yesterday with otho for symptoms and [facility] until 6 PM today. Patients husband is going to come pick her up. She denies fever, chills, nausea, vomiting, abdominal pain.   Past Medical History:  Diagnosis Date  . Anxiety   . Depression   . Diabetes mellitus without complication ([place])   . GERD (gastroesophageal reflux disease) 04/21/2015   History of Barrett's esophagus   . Heart murmur   . Seasonal allergies   . Skin cancer    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    Patient Active Problem List   Diagnosis Date Noted  . H/O adenomatous polyp of colon 08/20/2015  . Rupture of implant of right breast 05/18/2015  . Anxiety disorder 04/26/2015  . Allergic rhinitis 04/21/2015  . Anemia 04/21/2015  . Asthma 04/21/2015  . 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 Chronic infection of sinus 04/21/2015  . Depression 04/21/2015  . GERD (gastroesophageal reflux disease) 04/21/2015  . Headache 04/21/2015  . Menopausal symptoms 04/21/2015  . Obesity 04/21/2015  . Palpitations 04/21/2015  . Vitamin B12 deficiency 04/21/2015  . Aortic stenosis 01/21/2013  . Diastolic dysfunction 09/32/3557  . Diabetes mellitus out of control ([place]) 06/12/2006  . Barrett esophagus 06/13/2003  . Essential hypertension 06/12/1998  . Genital herpes 06/12/1998  . Hyperlipidemia, mixed  06/12/1998    Past Surgical History:  Procedure Laterality Date  . AUGMENTATION MAMMAPLASTY Bilateral    breast implants  . Bone Spur  2007   foot  . Carpal Tunnel Symdrome  2008   as repeated in 2009  . CHOLECYSTECTOMY  2007  . COLONOSCOPY WITH PROPOFOL N/A 11/17/2015   Procedure: COLONOSCOPY WITH PROPOFOL;  Surgeon: Manya Silvas, MD;  Location[facility] ENDOSCOPY;  Service: Endoscopy;  Laterality: N/A;  . ESOPHAGOGASTRODUODENOSCOPY (EGD) WITH PROPOFOL N/A 11/17/2015   Procedure: ESOPHAGOGASTRODUODENOSCOPY (EGD) WITH PROPOFOL;  Surgeon: Manya Silvas, MD;  Location: [facility] ENDOSCOPY;  Service: Endoscopy;  Laterality: N/A;  . MANDIBLE SURGERY    . NASAL SINUS SURGERY  03/2013   Multile surgery Dr. Carlis Abbott. 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 Release of Trigger Finger Right 10/2010   Dr. Tamala Julian  . TONSILLECTOMY  1960    Prior to Admission medications   Medication Sig Start Date End Date Taking? Authorizing Provider  amitriptyline (ELAVIL) 10 MG tablet TAKE UP TO 3 TABLETS BY MOUTH EVERY DAY AT BEDTIME 08/12/16   [name], MD  atorvastatin (LIPITOR) 80 MG tablet Take 1 tablet (80 mg total) by mouth daily. 02/17/16   [name], MD  butalbital-acetaminophen-caffeine (FIORICET, ESGIC) [phone] MG tablet Take 1 tablet by mouth every 6 (six) hours as needed. 08/28/16   [name], MD  Canagliflozin-Metformin HCl (INVOKAMET) [phone] MG TABS Take 1 tablet by mouth 2 (two) times daily. 02/17/16   [name], MD  cephALEXin (KEFLEX) 500 MG capsule Take 1 capsule (500 mg total) by mouth  4 (four) times daily. 10/10/16 10/20/16  Laban Emperor, PA-C  esomeprazole (NEXIUM) 40 MG capsule TAKE 1 CAPSULE (40 MG TOTAL) BY MOUTH DAILY. 02/17/16   [name], MD  Exenatide ER (BYDUREON) 2 MG PEN One injection (2mg ) once every seven days 02/17/16   [name], MD  fluconazole (DIFLUCAN) 150 MG tablet TAKE 1 TABLET BY MOUTH ONCE 10/06/16   [name], MD  fluticasone (FLONASE) 50  MCG/ACT nasal spray Place 2 sprays into both nostrils daily. 05/28/15   [name], MD  gabapentin (NEURONTIN) 300 MG capsule TAKE 1-2 CAPSULES BY MOUTH 2 TIMES A DAY 08/28/16   [name], MD  glipiZIDE (GLUCOTROL) 10 MG tablet TAKE 1 TABLET BY MOUTH DAILY 08/05/16   [name], MD  HYDROcodone-acetaminophen (NORCO/VICODIN) 5-325 MG tablet Take 1 tablet by mouth every 6 (six) hours as needed for moderate pain. 10/10/16   Laban Emperor, PA-C  Lancet Devices [place]     Historical Provider, MD  meloxicam (MOBIC) 15 MG tablet Take 1 tablet (15 mg total) by mouth daily. Patient not taking: Reported on 08/29/2016 05/07/16   Judithe Modest Hagler, PA-C  metoprolol-hydrochlorothiazide (LOPRESSOR HCT) 100-25 MG tablet Taken1/2 tablet daily 02/17/16   [name], MD  montelukast (SINGULAIR) 10 MG tablet TAKE 1 TABLET (10 MG TOTAL) BY MOUTH DAILY. 09/25/16   [name], MD  Olopatadine HCl (PATADAY) 0.2 % SOLN Apply to eye.    Historical Provider, MD  ondansetron (ZOFRAN) 4 MG tablet Take 1 tablet (4 mg total) by mouth every 8 (eight) hours as needed for nausea or vomiting. 08/29/16   [name], MD  promethazine (PHENERGAN) 12.5 MG tablet Take 1 tablet (12.5 mg total) by mouth every 6 (six) hours as needed for nausea or vomiting. Patient not taking: Reported on 08/29/2016 07/27/16   Trinna Post, PA-C  sertraline (ZOLOFT) 50 MG tablet Take 1 tablet (50 mg total) by mouth daily. 02/17/16   [name], MD  sulfamethoxazole-trimethoprim (BACTRIM DS,SEPTRA DS) 800-160 MG tablet Take 1 tablet by mouth 2 (two) times daily. 10/10/16   Laban Emperor, PA-C  valACYclovir (VALTREX) 1000 MG tablet Take 1 tablet daily for 5 days as needed 02/17/16   [name], MD  valsartan-hydrochlorothiazide (DIOVAN-HCT) 320-12.5 MG tablet Take 1 tablet by mouth daily. 02/17/16   [name], MD  zolpidem (AMBIEN) 5 MG tablet Take 5 mg by mouth once.     Historical Provider, MD    Allergies Biaxin   [clarithromycin]  Family History  Problem Relation Age of Onset  . Diabetes Father   . Breast cancer Maternal Aunt     Social History Social History  Substance Use Topics  . Smoking status: Never Smoker  . Smokeless tobacco: Never Used  . Alcohol use No     Review of Systems  Constitutional: No fever/chills ENT: No upper respiratory complaints. Cardiovascular: No chest pain. Respiratory: No SOB. Gastrointestinal: No abdominal pain.  No nausea, no vomiting.  Skin: Negative for  ecchymosis. Neurological: Negative for numbness or tingling   ____________________________________________   PHYSICAL EXAM:  VITAL SIGNS: ED Triage Vitals  Enc Vitals Group     BP 10/10/16 0714 (!) 157/67     Pulse Rate 10/10/16 0714 96     Resp 10/10/16 0714 18     Temp 10/10/16 0714 97.5 F (36.4 C)     Temp Source 10/10/16 0714 Oral     SpO2 10/10/16 0714 100 %  Weight 10/10/16 0715 155 lb (70.3 kg)     Height --      Head Circumference --      Peak Flow --      Pain Score 10/10/16 0714 10     Pain Loc --      Pain Edu? --      Excl. in [place]? --      Constitutional: Alert and oriented. Well appearing and in no acute distress. Eyes: Conjunctivae are normal. PERRL. EOMI. Head: Atraumatic. ENT:      Ears:      Nose: No congestion/rhinnorhea.      Mouth/Throat: Mucous membranes are moist.  Neck: No stridor.   Cardiovascular: Normal rate, regular rhythm.  Good peripheral circulation. 2+ radial pulses.  Respiratory: Normal respiratory effort without tachypnea or retractions. Lungs CTAB. Good air entry to the bases with no decreased or absent breath sounds. Musculoskeletal: Full range of motion to all extremities. No gross deformities appreciated. Neurologic:  Normal speech and language. No gross focal neurologic deficits are appreciated.  Skin:  Skin is warm, dry. 1 cm incision to palmar side of left hand. Purulent drainage with 1 cm surrounding erythema and sutures in  place.   ____________________________________________   LABS (all labs ordered are listed, but only abnormal results are displayed)  Labs Reviewed - No data to display ____________________________________________  EKG   ____________________________________________  RADIOLOGY  No results found.  ____________________________________________    PROCEDURES  Procedure(s) performed:    Procedures    Medications  oxyCODONE-acetaminophen (PERCOCET/ROXICET) 5-325 MG per tablet 1 tablet (1 tablet Oral Given 10/10/16 0803)  clindamycin (CLEOCIN) IVPB 600 mg (0 mg Intravenous Stopped 10/10/16 0849)     ____________________________________________   INITIAL IMPRESSION / ASSESSMENT AND PLAN / ED COURSE  Pertinent labs & imaging results that were available during my care of the patient were reviewed by me and considered in my medical decision making (see chart for details).  Review of the [place] CSRS was performed in accordance of the [place] prior to dispensing any controlled drugs.     Patient's diagnosis is consistent with wound infection. Vital signs and exam are reassuring. IV clindamycin was given an ED. Patient was prescribed prescriptions for Bactrim and Keflex by orthopedics but they will not be ready for tonight. I will rewrite these prescriptions and she can take them somewhere else. Patient is to follow up with orthopedics as directed. Patient has a follow-up appointment scheduled for Friday. Patient is given ED precautions to return to the ED for any worsening or new symptoms.     ____________________________________________  FINAL CLINICAL IMPRESSION(S) / ED DIAGNOSES  Final diagnoses:  Wound infection      NEW MEDICATIONS STARTED DURING THIS VISIT:  Discharge Medication List as of 10/10/2016  8:28 AM    START taking these medications   Details  cephALEXin (KEFLEX) 500 MG capsule Take 1 capsule (500 mg total) by mouth 4 (four) times daily., Starting Tue  10/10/2016, Until Fri 10/20/2016, Print    sulfamethoxazole-trimethoprim (BACTRIM DS,SEPTRA DS) 800-160 MG tablet Take 1 tablet by mouth 2 (two) times daily., Starting Tue 10/10/2016, Print            This chart was dictated using voice recognition software/Dragon. Despite best efforts to proofread, errors can occur which can change the meaning. Any change was purely unintentional.    Laban Emperor, PA-C 10/10/16 1058    Eula Listen, MD 10/10/16 1105

## 2016-10-10 NOTE — ED Triage Notes (Signed)
Pt with left hand pain, had surgery a week ago with Dr Sabra Heck. Pt states he only gave tramadol for pain and is not working. Pt states she needs something stronger like morphine. Pt also states supposed to be on antibiotics but will not be ready until tonight.

## 2016-10-10 NOTE — ED Notes (Signed)
See triage note  States she had a trigger finger release done last week  Hand became swollen and tender .Marland Kitchenyesterday was seen by orthro PA and had antibiotics called in   States today she is having some slight drainage from hand  Sutures intact

## 2016-10-12 ENCOUNTER — Other Ambulatory Visit: Payer: Self-pay | Admitting: *Deleted

## 2016-10-12 DIAGNOSIS — E1140 Type 2 diabetes mellitus with diabetic neuropathy, unspecified: Secondary | ICD-10-CM

## 2016-10-12 DIAGNOSIS — IMO0002 Reserved for concepts with insufficient information to code with codable children: Secondary | ICD-10-CM

## 2016-10-12 DIAGNOSIS — E1165 Type 2 diabetes mellitus with hyperglycemia: Principal | ICD-10-CM

## 2016-10-12 MED ORDER — CANAGLIFLOZIN-METFORMIN HCL 150-1000 MG PO TABS: 1.0000 | ORAL_TABLET | Freq: Two times a day (BID) | ORAL | 1 refills | Status: DC

## 2016-11-17 ENCOUNTER — Other Ambulatory Visit: Payer: Self-pay | Admitting: *Deleted

## 2016-11-17 MED ORDER — BUTALBITAL-APAP-CAFFEINE 50-325-40 MG PO TABS: 1.0000 | ORAL_TABLET | Freq: Four times a day (QID) | ORAL | 5 refills | Status: DC | PRN

## 2016-11-17 NOTE — Telephone Encounter (Signed)
Rx called in to pharmacy. 

## 2016-11-17 NOTE — Telephone Encounter (Signed)
Please call in Fioricet.  

## 2016-11-25 ENCOUNTER — Other Ambulatory Visit: Payer: Self-pay | Admitting: Family Medicine

## 2016-11-25 DIAGNOSIS — E1165 Type 2 diabetes mellitus with hyperglycemia: Principal | ICD-10-CM

## 2016-11-25 DIAGNOSIS — IMO0002 Reserved for concepts with insufficient information to code with codable children: Secondary | ICD-10-CM

## 2016-11-25 DIAGNOSIS — E1140 Type 2 diabetes mellitus with diabetic neuropathy, unspecified: Secondary | ICD-10-CM

## 2016-11-28 ENCOUNTER — Other Ambulatory Visit: Payer: Self-pay

## 2016-11-28 DIAGNOSIS — E1140 Type 2 diabetes mellitus with diabetic neuropathy, unspecified: Secondary | ICD-10-CM

## 2016-11-28 DIAGNOSIS — E1165 Type 2 diabetes mellitus with hyperglycemia: Principal | ICD-10-CM

## 2016-11-28 DIAGNOSIS — IMO0002 Reserved for concepts with insufficient information to code with codable children: Secondary | ICD-10-CM

## 2016-11-28 MED ORDER — GLIPIZIDE 10 MG PO TABS: 10.0000 mg | ORAL_TABLET | Freq: Every day | ORAL | 0 refills | Status: DC

## 2016-12-04 ENCOUNTER — Other Ambulatory Visit: Payer: Self-pay | Admitting: Family Medicine

## 2016-12-04 DIAGNOSIS — E1165 Type 2 diabetes mellitus with hyperglycemia: Principal | ICD-10-CM

## 2016-12-04 DIAGNOSIS — E1140 Type 2 diabetes mellitus with diabetic neuropathy, unspecified: Secondary | ICD-10-CM

## 2016-12-04 DIAGNOSIS — IMO0002 Reserved for concepts with insufficient information to code with codable children: Secondary | ICD-10-CM

## 2016-12-15 ENCOUNTER — Other Ambulatory Visit: Payer: Self-pay | Admitting: Family Medicine

## 2017-01-04 ENCOUNTER — Other Ambulatory Visit: Payer: Self-pay | Admitting: Family Medicine

## 2017-01-04 DIAGNOSIS — IMO0002 Reserved for concepts with insufficient information to code with codable children: Secondary | ICD-10-CM

## 2017-01-04 DIAGNOSIS — E1140 Type 2 diabetes mellitus with diabetic neuropathy, unspecified: Secondary | ICD-10-CM

## 2017-01-04 DIAGNOSIS — E1165 Type 2 diabetes mellitus with hyperglycemia: Principal | ICD-10-CM

## 2017-01-20 ENCOUNTER — Other Ambulatory Visit: Payer: Self-pay | Admitting: Family Medicine

## 2017-02-02 ENCOUNTER — Other Ambulatory Visit: Payer: Self-pay | Admitting: Family Medicine

## 2017-02-02 DIAGNOSIS — IMO0002 Reserved for concepts with insufficient information to code with codable children: Secondary | ICD-10-CM

## 2017-02-02 DIAGNOSIS — E1140 Type 2 diabetes mellitus with diabetic neuropathy, unspecified: Secondary | ICD-10-CM

## 2017-02-02 DIAGNOSIS — E1165 Type 2 diabetes mellitus with hyperglycemia: Principal | ICD-10-CM

## 2017-02-07 ENCOUNTER — Telehealth: Payer: Self-pay | Admitting: Family Medicine

## 2017-02-07 NOTE — Telephone Encounter (Signed)
t sayds she has a yeast infection and would like a rx phoned in for diflucan  Her call back is [phone]  [facility] [employer]  thanks [employer]

## 2017-02-08 ENCOUNTER — Other Ambulatory Visit: Payer: Self-pay | Admitting: Family Medicine

## 2017-02-08 MED ORDER — FLUCONAZOLE 150 MG PO TABS: ORAL_TABLET | ORAL | 1 refills | Status: DC

## 2017-02-08 NOTE — Telephone Encounter (Signed)
Please advise 

## 2017-02-08 NOTE — Telephone Encounter (Signed)
L/M stating below.  

## 2017-02-14 ENCOUNTER — Ambulatory Visit: Payer: BLUE CROSS/BLUE SHIELD | Admitting: Family Medicine

## 2017-02-14 ENCOUNTER — Other Ambulatory Visit: Payer: Self-pay | Admitting: Family Medicine

## 2017-02-20 ENCOUNTER — Other Ambulatory Visit: Payer: Self-pay | Admitting: Family Medicine

## 2017-03-03 ENCOUNTER — Other Ambulatory Visit: Payer: Self-pay | Admitting: Family Medicine

## 2017-03-17 ENCOUNTER — Other Ambulatory Visit: Payer: Self-pay | Admitting: Family Medicine

## 2017-03-28 ENCOUNTER — Other Ambulatory Visit: Payer: Self-pay | Admitting: Family Medicine

## 2017-03-28 DIAGNOSIS — E1165 Type 2 diabetes mellitus with hyperglycemia: Principal | ICD-10-CM

## 2017-03-28 DIAGNOSIS — IMO0002 Reserved for concepts with insufficient information to code with codable children: Secondary | ICD-10-CM

## 2017-03-28 DIAGNOSIS — E1140 Type 2 diabetes mellitus with diabetic neuropathy, unspecified: Secondary | ICD-10-CM

## 2017-04-03 ENCOUNTER — Other Ambulatory Visit: Payer: Self-pay | Admitting: Family Medicine

## 2017-04-03 DIAGNOSIS — IMO0002 Reserved for concepts with insufficient information to code with codable children: Secondary | ICD-10-CM

## 2017-04-03 DIAGNOSIS — E1140 Type 2 diabetes mellitus with diabetic neuropathy, unspecified: Secondary | ICD-10-CM

## 2017-04-03 DIAGNOSIS — E1165 Type 2 diabetes mellitus with hyperglycemia: Principal | ICD-10-CM

## 2017-04-03 NOTE — Telephone Encounter (Signed)
Pharmacy requesting refills. Thanks!  

## 2017-04-15 ENCOUNTER — Other Ambulatory Visit: Payer: Self-pay | Admitting: Family Medicine

## 2017-04-15 DIAGNOSIS — E1140 Type 2 diabetes mellitus with diabetic neuropathy, unspecified: Secondary | ICD-10-CM

## 2017-04-15 DIAGNOSIS — IMO0002 Reserved for concepts with insufficient information to code with codable children: Secondary | ICD-10-CM

## 2017-04-15 DIAGNOSIS — E1165 Type 2 diabetes mellitus with hyperglycemia: Principal | ICD-10-CM

## 2017-04-18 ENCOUNTER — Ambulatory Visit (INDEPENDENT_AMBULATORY_CARE_PROVIDER_SITE_OTHER): Payer: BLUE CROSS/BLUE SHIELD | Admitting: Family Medicine

## 2017-04-18 ENCOUNTER — Encounter: Payer: Self-pay | Admitting: Family Medicine

## 2017-04-18 VITALS — BP 138/60 | HR 88 | Temp 99.1°F | Resp 16 | Wt 152.0 lb

## 2017-04-18 DIAGNOSIS — E1140 Type 2 diabetes mellitus with diabetic neuropathy, unspecified: Secondary | ICD-10-CM

## 2017-04-18 DIAGNOSIS — E782 Mixed hyperlipidemia: Secondary | ICD-10-CM | POA: Diagnosis not present

## 2017-04-18 DIAGNOSIS — K219 Gastro-esophageal reflux disease without esophagitis: Secondary | ICD-10-CM

## 2017-04-18 DIAGNOSIS — E1165 Type 2 diabetes mellitus with hyperglycemia: Secondary | ICD-10-CM

## 2017-04-18 DIAGNOSIS — Z23 Encounter for immunization: Secondary | ICD-10-CM | POA: Diagnosis not present

## 2017-04-18 DIAGNOSIS — IMO0002 Reserved for concepts with insufficient information to code with codable children: Secondary | ICD-10-CM

## 2017-04-18 LAB — POCT GLYCOSYLATED HEMOGLOBIN (HGB A1C)
Est. average glucose Bld gHb Est-mCnc: >326
Hemoglobin A1C: >14

## 2017-04-18 LAB — GLUCOSE, POCT (MANUAL RESULT ENTRY): POC GLUCOSE: 252 mg/dL — AB (ref 70–99)

## 2017-04-18 MED ORDER — CANAGLIFLOZIN-METFORMIN HCL 150-1000 MG PO TABS: 1.0000 | ORAL_TABLET | Freq: Two times a day (BID) | ORAL | 2 refills | Status: DC

## 2017-04-18 MED ORDER — EXENATIDE ER 2 MG ~~LOC~~ PEN: PEN_INJECTOR | SUBCUTANEOUS | 3 refills | Status: DC

## 2017-04-18 MED ORDER — GLIPIZIDE 10 MG PO TABS: ORAL_TABLET | ORAL | 3 refills | Status: DC

## 2017-04-18 MED ORDER — ESOMEPRAZOLE MAGNESIUM 40 MG PO CPDR: DELAYED_RELEASE_CAPSULE | ORAL | 0 refills | Status: DC

## 2017-04-18 NOTE — Progress Notes (Signed)
Patient: Katie Buckley Female    DOB: Jul 24, 1950   66 y.o.   MRN: [id number] Visit Date: 04/18/2017  Today's Provider: Lelon Huh, MD   Chief Complaint  Patient presents with  . Hypertension    follow up  . Hyperlipidemia    follow up   Subjective:    HPI   Hypertension, follow-up:  BP Readings from Last 3 Encounters:  10/10/16 (!) 150/70  08/29/16 132/64  08/03/16 (!) 149/67    She was last seen for hypertension 8 months ago.  BP at that visit was 132/6; no changes. Management since that visit includes .She reports good compliance with treatment. She is not having side effects.  She is not exercising. She is adherent to low salt diet.   Outside blood pressures are 138/82. She is experiencing none.  Patient denies chest pain, chest pressure/discomfort, claudication, dyspnea, exertional chest pressure/discomfort, fatigue, irregular heart beat, lower extremity edema, near-syncope, orthopnea, palpitations, paroxysmal nocturnal dyspnea, syncope and tachypnea.   Cardiovascular risk factors include diabetes mellitus and hypertension.  Use of agents associated with hypertension: none.   ------------------------------------------------------------------------    Lipid/Cholesterol, Follow-up:   Last seen for this 8 months ago.  Management since that visit includes; no changes, will check lipids at follow-up.  Last Lipid Panel:    Component Value Date/Time   CHOL 303 (A) 07/13/2016   CHOL 360 (H) 05/18/2015 0845   CHOL 244 (H) 03/28/2013 0419   TRIG 345 (A) 07/13/2016   TRIG 200 03/28/2013 0419   HDL 76 (A) 07/13/2016   HDL 83 05/18/2015 0845   HDL 79 (H) 03/28/2013 0419   CHOLHDL 4.3 05/18/2015 0845   VLDL 69 07/13/2016   VLDL 40 03/28/2013 0419   LDLCALC 158 07/13/2016   LDLCALC 206 (H) 05/18/2015 0845   LDLCALC 125 (H) 03/28/2013 0419    She reports good compliance with treatment. She is not having side effects.   Wt Readings from Last 3  Encounters:  10/10/16 155 lb (70.3 kg)  08/29/16 155 lb (70.3 kg)  08/03/16 155 lb (70.3 kg)    ------------------------------------------------------------------------  Diabetes Mellitus Type II, Follow-up:   Lab Results  Component Value Date   HGBA1C 8.5 07/13/2016   HGBA1C 9.9 02/17/2016   HGBA1C 8.8 04/23/2015    Last seen for diabetes 9 months ago.  Management since then includes no changes. She reports poor compliance with treatment. She is not having side effects.  Current symptoms include visual disturbances and have been stable. Home blood sugar records: patient checks blood sugars occasionally; she is unsure of the readings  Episodes of hypoglycemia? no   Current Insulin Regimen: none Most Recent Eye Exam: 09/2016 Weight trend: stable Prior visit with dietician: no Current diet: in general, an "unhealthy" diet Current exercise: none  Pertinent Labs:    Component Value Date/Time   CHOL 303 (A) 07/13/2016   CHOL 360 (H) 05/18/2015 0845   CHOL 244 (H) 03/28/2013 0419   TRIG 345 (A) 07/13/2016   TRIG 200 03/28/2013 0419   HDL 76 (A) 07/13/2016   HDL 83 05/18/2015 0845   HDL 79 (H) 03/28/2013 0419   LDLCALC 158 07/13/2016   LDLCALC 206 (H) 05/18/2015 0845   LDLCALC 125 (H) 03/28/2013 0419   CREATININE 0.55 (L) 08/29/2016 0913   CREATININE 0.87 02/08/2014 1717    Wt Readings from Last 3 Encounters:  04/18/17 152 lb (68.9 kg)  10/10/16 155 lb (70.3 kg)  08/29/16 155 lb (70.3  kg)    Out of Bydureon for several months Out of Glipizide since last week.  ------------------------------------------------------------------------    Allergies  Allergen Reactions  . Biaxin  [Clarithromycin]     GI upset, and bad taste in mouth.     Current Outpatient Medications:  .  amitriptyline (ELAVIL) 10 MG tablet, TAKE UP TO 3 TABLETS BY MOUTH EVERY DAY AT BEDTIME**NEEDS OFFICE VISIT**, Disp: 60 tablet, Rfl: 0 .  atorvastatin (LIPITOR) 80 MG tablet, TAKE 1 TABLET  BY MOUTH DAILY, Disp: 90 tablet, Rfl: 0 .  butalbital-acetaminophen-caffeine (FIORICET, ESGIC) 50-325-40 MG tablet, Take 1 tablet by mouth every 6 (six) hours as needed., Disp: 30 tablet, Rfl: 5 .  esomeprazole (NEXIUM) 40 MG capsule, TAKE 1 CAPSULE (40 MG TOTAL) BY MOUTH DAILY., Disp: 90 capsule, Rfl: 1 .  Exenatide ER (BYDUREON) 2 MG PEN, One injection (2mg ) once every seven days, Disp: 12 each, Rfl: 1 .  fluconazole (DIFLUCAN) 150 MG tablet, TAKE 1 TABLET BY MOUTH AS ONE DOSE, Disp: 1 tablet, Rfl: 1 .  fluticasone (FLONASE) 50 MCG/ACT nasal spray, Place 2 sprays into both nostrils daily., Disp: 16 g, Rfl: 6 .  gabapentin (NEURONTIN) 300 MG capsule, TAKE 1-2 CAPSULES BY MOUTH 2 TIMES A DAY, Disp: 360 capsule, Rfl: 3 .  glipiZIDE (GLUCOTROL) 10 MG tablet, TAKE 1 TABLET BY MOUTH EVERY DAY**NEEDS OFFICE VISIT**, Disp: 10 tablet, Rfl: 0 .  INVOKAMET [phone] MG TABS, TAKE 1 TABLET BY MOUTH TWICE A DAY, Disp: 30 tablet, Rfl: 0 .  Lancet Devices MISC, , Disp: , Rfl:  .  meloxicam (MOBIC) 15 MG tablet, Take 1 tablet (15 mg total) by mouth daily., Disp: 30 tablet, Rfl: 0 .  metoprolol-hydrochlorothiazide (LOPRESSOR HCT) 100-25 MG tablet, TAKE 1/2 TABLET BY MOUTH EVERY DAY, Disp: 15 tablet, Rfl: 0 .  montelukast (SINGULAIR) 10 MG tablet, TAKE 1 TABLET BY MOUTH EVERY DAY, Disp: 30 tablet, Rfl: 0 .  Olopatadine HCl (PATADAY) 0.2 % SOLN, Apply to eye., Disp: , Rfl:  .  sertraline (ZOLOFT) 50 MG tablet, Take 1 tablet (50 mg total) by mouth daily., Disp: 90 tablet, Rfl: 3 .  valACYclovir (VALTREX) 1000 MG tablet, Take 1 tablet daily for 5 days as needed, Disp: 30 tablet, Rfl: 3 .  valsartan-hydrochlorothiazide (DIOVAN-HCT) 320-12.5 MG tablet, TAKE 1 TABLET BY MOUTH DAILY, Disp: 90 tablet, Rfl: 2 .  zolpidem (AMBIEN) 5 MG tablet, Take 5 mg by mouth once. , Disp: , Rfl:   Current Facility-Administered Medications:  .  promethazine (PHENERGAN) injection 25 mg, 25 mg, Intramuscular, Once, Pollak, Adriana M,  PA-C  Review of Systems  Constitutional: Negative for appetite change, chills, fatigue and fever.  Eyes: Positive for visual disturbance.  Respiratory: Negative for chest tightness and shortness of breath.   Cardiovascular: Negative for chest pain and palpitations.  Gastrointestinal: Negative for abdominal pain, nausea and vomiting.  Neurological: Negative for dizziness and weakness.    Social History   Tobacco Use  . Smoking status: Never Smoker  . Smokeless tobacco: Never Used  Substance Use Topics  . Alcohol use: No   Objective:   BP 138/60 (BP Location: Right Arm, Patient Position: Sitting, Cuff Size: Large)   Pulse 88   Temp 99.1 F (37.3 C) (Oral)   Resp 16   Wt 152 lb (68.9 kg)   SpO2 97% Comment: room air  BMI 29.69 kg/m  There were no vitals filed for this visit.   Physical Exam   General Appearance:    Alert,  cooperative, no distress  Eyes:    PERRL, conjunctiva/corneas clear, EOM's intact       Lungs:     Clear to auscultation bilaterally, respirations unlabored  Heart:    Regular rate and rhythm  Neurologic:   Awake, alert, oriented x 3. No apparent focal neurological           defect.       Results for orders placed or performed in visit on 04/18/17  POCT HgB A1C  Result Value Ref Range   Hemoglobin A1C >14.0    Est. average glucose Bld gHb Est-mCnc >326   POCT Glucose (CBG)  Result Value Ref Range   POC Glucose 252 (A) 70 - 99 mg/dl       Assessment & Plan:     1. Uncontrolled type 2 diabetes mellitus with hyperglycemia (HCC) Poor compliance with medications and follow up,  - POCT HgB A1C - POCT Glucose (CBG)  2. Uncontrolled type 2 diabetes mellitus with diabetic neuropathy, without long-term current use of insulin ([place]) Get back on glipizide, twice a day Invokamet and Bydureon.  - glipiZIDE (GLUCOTROL) 10 MG tablet; TAKE 1 TABLET BY MOUTH EVERY DAY  Dispense: 30 tablet; Refill: 3 - Canagliflozin-Metformin HCl (INVOKAMET) [phone] MG TABS;  Take 1 tablet 2 (two) times daily by mouth.  Dispense: 60 tablet; Refill: 2  3. Gastroesophageal reflux disease without esophagitis Well controlled on esomeprazole (NEXIUM) 40 MG capsule; TAKE 1 CAPSULE (40 MG TOTAL) BY MOUTH DAILY.  Dispense: 90 capsule; Refill: 0  4. Hyperlipidemia, mixed She is tolerating atorvastatin well with no adverse effects.   - COMPLETE METABOLIC PANEL WITH GFR - Lipid panel  5. Need for influenza vaccination  - Flu vaccine HIGH DOSE PF (Fluzone High dose)  6. Need for pneumococcal vaccination  - Pneumococcal conjugate vaccine 13-valent  Return in about 8 weeks (around 06/13/2017).       Lelon Huh, MD  [facility]

## 2017-04-19 ENCOUNTER — Telehealth: Payer: Self-pay | Admitting: Family Medicine

## 2017-04-19 MED ORDER — DULAGLUTIDE 1.5 MG/0.5ML ~~LOC~~ SOAJ: 1.5000 mg | SUBCUTANEOUS | 6 refills | Status: DC

## 2017-04-19 NOTE — Telephone Encounter (Signed)
Left detailed message on pt's vm advising her to call back if she has any questions.

## 2017-04-19 NOTE — Telephone Encounter (Signed)
Please advise patient that insurance does not cover Bydureon, but they do cover alternatives, have [facility], which is also a once weekly injection.

## 2017-04-20 DIAGNOSIS — E1165 Type 2 diabetes mellitus with hyperglycemia: Secondary | ICD-10-CM

## 2017-04-20 DIAGNOSIS — E1140 Type 2 diabetes mellitus with diabetic neuropathy, unspecified: Secondary | ICD-10-CM | POA: Insufficient documentation

## 2017-05-01 ENCOUNTER — Other Ambulatory Visit: Payer: Self-pay | Admitting: Family Medicine

## 2017-05-01 DIAGNOSIS — E1165 Type 2 diabetes mellitus with hyperglycemia: Principal | ICD-10-CM

## 2017-05-01 DIAGNOSIS — IMO0002 Reserved for concepts with insufficient information to code with codable children: Secondary | ICD-10-CM

## 2017-05-01 DIAGNOSIS — E1140 Type 2 diabetes mellitus with diabetic neuropathy, unspecified: Secondary | ICD-10-CM

## 2017-05-01 LAB — COMPLETE METABOLIC PANEL WITH GFR
AG Ratio: 2 (calc) (ref 1.0–2.5)
ALKALINE PHOSPHATASE (APISO): 85 U/L (ref 33–130)
ALT: 28 U/L (ref 6–29)
AST: 29 U/L (ref 10–35)
Albumin: 4.4 g/dL (ref 3.6–5.1)
BUN: 12 mg/dL (ref 7–25)
CALCIUM: 9.6 mg/dL (ref 8.6–10.4)
CO2: 28 mmol/L (ref 20–32)
CREATININE: 0.61 mg/dL (ref 0.50–0.99)
Chloride: 100 mmol/L (ref 98–110)
GFR, EST NON AFRICAN AMERICAN: 95 mL/min/{1.73_m2} (ref >=60)
GFR, Est African American: 110 mL/min/{1.73_m2} (ref >=60)
GLUCOSE: 172 mg/dL — AB (ref 65–99)
Globulin: 2.2 g/dL (calc) (ref 1.9–3.7)
Potassium: 3.9 mmol/L (ref 3.5–5.3)
Sodium: 138 mmol/L (ref 135–146)
Total Bilirubin: 0.4 mg/dL (ref 0.2–1.2)
Total Protein: 6.6 g/dL (ref 6.1–8.1)

## 2017-05-01 LAB — LIPID PANEL
CHOL/HDL RATIO: 3 (calc) (ref <5.0)
Cholesterol: 206 mg/dL — ABNORMAL HIGH (ref <200)
HDL: 68 mg/dL (ref >50)
LDL Cholesterol (Calc): 104 mg/dL (calc) — ABNORMAL HIGH
NON-HDL CHOLESTEROL (CALC): 138 mg/dL — AB (ref <130)
TRIGLYCERIDES: 226 mg/dL — AB (ref <150)

## 2017-05-01 MED ORDER — GLIPIZIDE 10 MG PO TABS: ORAL_TABLET | ORAL | 0 refills | Status: DC

## 2017-05-01 NOTE — Telephone Encounter (Signed)
Patient would like to get refills on Glipizide 10 mg.  #90 with 3 refills called to [facility] [address].

## 2017-05-01 NOTE — Telephone Encounter (Signed)
Patient requesting a 90 day supply

## 2017-05-02 ENCOUNTER — Telehealth: Payer: Self-pay

## 2017-05-02 MED ORDER — EZETIMIBE 10 MG PO TABS: 10.0000 mg | ORAL_TABLET | Freq: Every day | ORAL | 3 refills | Status: DC

## 2017-05-02 NOTE — Telephone Encounter (Signed)
Patient advised. She verbalized understanding. [facility].

## 2017-05-02 NOTE — Telephone Encounter (Signed)
-----   Message from [name], MD sent at 05/02/2017  8:08 AM EST ----- LDL cholesterol is 104, needs to be under 70 due to having diabetes. Add ezetimibe 10 once a day, #30, rf x 3. Recheck lipids at follow up in January

## 2017-05-07 ENCOUNTER — Other Ambulatory Visit: Payer: Self-pay | Admitting: *Deleted

## 2017-05-07 DIAGNOSIS — E1140 Type 2 diabetes mellitus with diabetic neuropathy, unspecified: Secondary | ICD-10-CM

## 2017-05-07 DIAGNOSIS — E1165 Type 2 diabetes mellitus with hyperglycemia: Principal | ICD-10-CM

## 2017-05-07 DIAGNOSIS — IMO0002 Reserved for concepts with insufficient information to code with codable children: Secondary | ICD-10-CM

## 2017-05-07 MED ORDER — GLIPIZIDE 10 MG PO TABS: ORAL_TABLET | ORAL | 0 refills | Status: DC

## 2017-05-16 ENCOUNTER — Other Ambulatory Visit: Payer: Self-pay | Admitting: Family Medicine

## 2017-06-07 ENCOUNTER — Other Ambulatory Visit: Payer: Self-pay | Admitting: Family Medicine

## 2017-06-20 ENCOUNTER — Ambulatory Visit: Payer: Self-pay | Admitting: Family Medicine

## 2017-06-21 ENCOUNTER — Other Ambulatory Visit: Payer: Self-pay | Admitting: Family Medicine

## 2017-06-22 ENCOUNTER — Encounter: Payer: Self-pay | Admitting: Physician Assistant

## 2017-06-22 ENCOUNTER — Ambulatory Visit (INDEPENDENT_AMBULATORY_CARE_PROVIDER_SITE_OTHER): Payer: BLUE CROSS/BLUE SHIELD | Admitting: Physician Assistant

## 2017-06-22 VITALS — BP 138/78 | HR 82 | Temp 98.3°F | Resp 16 | Wt 155.0 lb

## 2017-06-22 DIAGNOSIS — T3695XA Adverse effect of unspecified systemic antibiotic, initial encounter: Secondary | ICD-10-CM | POA: Diagnosis not present

## 2017-06-22 DIAGNOSIS — J0140 Acute pansinusitis, unspecified: Secondary | ICD-10-CM | POA: Diagnosis not present

## 2017-06-22 DIAGNOSIS — B379 Candidiasis, unspecified: Secondary | ICD-10-CM | POA: Diagnosis not present

## 2017-06-22 DIAGNOSIS — R110 Nausea: Secondary | ICD-10-CM

## 2017-06-22 MED ORDER — FLUCONAZOLE 150 MG PO TABS: 150.0000 mg | ORAL_TABLET | Freq: Once | ORAL | 0 refills | Status: AC

## 2017-06-22 MED ORDER — AMOXICILLIN-POT CLAVULANATE 875-125 MG PO TABS: 1.0000 | ORAL_TABLET | Freq: Two times a day (BID) | ORAL | 0 refills | Status: DC

## 2017-06-22 MED ORDER — ONDANSETRON HCL 4 MG PO TABS: 4.0000 mg | ORAL_TABLET | Freq: Three times a day (TID) | ORAL | 0 refills | Status: DC | PRN

## 2017-06-22 NOTE — Progress Notes (Signed)
Patient: Katie Buckley Female    DOB: 10-Dec-1950   67 y.o.   MRN: [id number] Visit Date: 06/22/2017  Today's Provider: Mar Daring, PA-C   Chief Complaint  Patient presents with  . URI   Subjective:    HPI Pt is here today because she is not feeling well. She reports that she has not felt well for a couple of weeks now. She has sinus pain, pressure, congestion, cough, chills, body aches, sweats, ear pain, post nasal drainage, fatigue, feels off balance, headaches, and scratchy throat. She also feels flushed today. She denies any fever, shortness of breath or chest pain. She had a flu vaccine this year. She reports that she has a lot of post nasal drainage and when she lays down she gets a taste in her mouth that taste like an infection.     Allergies  Allergen Reactions  . Biaxin  [Clarithromycin]     GI upset, and bad taste in mouth.     Current Outpatient Medications:  .  amitriptyline (ELAVIL) 10 MG tablet, TAKE UP TO 3 TABLETS BY MOUTH EVERY DAY AT BEDTIME**NEEDS OFFICE VISIT**, Disp: 60 tablet, Rfl: 0 .  atorvastatin (LIPITOR) 80 MG tablet, TAKE 1 TABLET BY MOUTH EVERY DAY, Disp: 90 tablet, Rfl: 1 .  butalbital-acetaminophen-caffeine (FIORICET, ESGIC) 50-325-40 MG tablet, Take 1 tablet by mouth every 6 (six) hours as needed., Disp: 30 tablet, Rfl: 5 .  Canagliflozin-Metformin HCl (INVOKAMET) [phone] MG TABS, Take 1 tablet 2 (two) times daily by mouth., Disp: 60 tablet, Rfl: 2 .  Dulaglutide (TRULICITY) 1.5 AS/3.4HD SOPN, Inject 1.5 mg once a week into the skin., Disp: 4 pen, Rfl: 6 .  esomeprazole (NEXIUM) 40 MG capsule, TAKE 1 CAPSULE (40 MG TOTAL) BY MOUTH DAILY., Disp: 90 capsule, Rfl: 0 .  ezetimibe (ZETIA) 10 MG tablet, Take 1 tablet (10 mg total) by mouth daily., Disp: 30 tablet, Rfl: 3 .  gabapentin (NEURONTIN) 300 MG capsule, TAKE 1-2 CAPSULES BY MOUTH 2 TIMES A DAY, Disp: 360 capsule, Rfl: 3 .  glipiZIDE (GLUCOTROL) 10 MG tablet, TAKE 1 TABLET BY  MOUTH EVERY DAY, Disp: 90 tablet, Rfl: 0 .  Lancet Devices MISC, , Disp: , Rfl:  .  metoprolol-hydrochlorothiazide (LOPRESSOR HCT) 100-25 MG tablet, TAKE 1/2 TABLET BY MOUTH EVERY DAY, Disp: 15 tablet, Rfl: 2 .  montelukast (SINGULAIR) 10 MG tablet, TAKE 1 TABLET BY MOUTH EVERY DAY, Disp: 30 tablet, Rfl: 5 .  Olopatadine HCl (PATADAY) 0.2 % SOLN, Apply to eye., Disp: , Rfl:  .  sertraline (ZOLOFT) 50 MG tablet, Take 1 tablet (50 mg total) by mouth daily., Disp: 90 tablet, Rfl: 3 .  valsartan-hydrochlorothiazide (DIOVAN-HCT) 320-12.5 MG tablet, TAKE 1 TABLET BY MOUTH DAILY, Disp: 90 tablet, Rfl: 2 .  zolpidem (AMBIEN) 5 MG tablet, Take 5 mg by mouth once. , Disp: , Rfl:  .  fluconazole (DIFLUCAN) 150 MG tablet, TAKE 1 TABLET BY MOUTH AS ONE DOSE (Patient not taking: Reported on 06/22/2017), Disp: 1 tablet, Rfl: 1 .  fluticasone (FLONASE) 50 MCG/ACT nasal spray, Place 2 sprays into both nostrils daily. (Patient not taking: Reported on 06/22/2017), Disp: 16 g, Rfl: 6 .  meloxicam (MOBIC) 15 MG tablet, Take 1 tablet (15 mg total) by mouth daily. (Patient not taking: Reported on 06/22/2017), Disp: 30 tablet, Rfl: 0 .  valACYclovir (VALTREX) 1000 MG tablet, Take 1 tablet daily for 5 days as needed (Patient not taking: Reported on 06/22/2017), Disp: 30 tablet,  Rfl: 3  Current Facility-Administered Medications:  .  promethazine (PHENERGAN) injection 25 mg, 25 mg, Intramuscular, Once, Pollak, Adriana M, PA-C  Review of Systems  Constitutional: Positive for chills, diaphoresis and fatigue.  HENT: Positive for congestion, ear pain, postnasal drip, rhinorrhea, sinus pressure, sinus pain, sneezing and sore throat.   Eyes: Negative.   Respiratory: Positive for cough.   Cardiovascular: Negative.   Gastrointestinal: Positive for nausea.  Endocrine: Negative.   Genitourinary: Negative.   Musculoskeletal: Positive for arthralgias and myalgias.  Skin: Negative.   Allergic/Immunologic: Negative.   Neurological:  Positive for dizziness and headaches.  Hematological: Negative.   Psychiatric/Behavioral: Negative.     Social History   Tobacco Use  . Smoking status: Never Smoker  . Smokeless tobacco: Never Used  Substance Use Topics  . Alcohol use: No   Objective:   BP 138/78 (BP Location: Left Arm, Patient Position: Sitting, Cuff Size: Normal)   Pulse 82   Temp 98.3 F (36.8 C) (Oral)   Resp 16   Wt 155 lb (70.3 kg)   SpO2 96%   BMI 30.27 kg/m  Vitals:   06/22/17 1421  BP: 138/78  Pulse: 82  Resp: 16  Temp: 98.3 F (36.8 C)  TempSrc: Oral  SpO2: 96%  Weight: 155 lb (70.3 kg)     Physical Exam  Constitutional: She appears well-developed and well-nourished. No distress.  HENT:  Head: Normocephalic and atraumatic.  Right Ear: Hearing, tympanic membrane, external ear and ear canal normal.  Left Ear: Hearing, tympanic membrane, external ear and ear canal normal.  Nose: Right sinus exhibits maxillary sinus tenderness and frontal sinus tenderness. Left sinus exhibits maxillary sinus tenderness and frontal sinus tenderness.  Mouth/Throat: Uvula is midline, oropharynx is clear and moist and mucous membranes are normal. No oropharyngeal exudate.  Neck: Normal range of motion. Neck supple. No tracheal deviation present. No thyromegaly present.  Cardiovascular: Normal rate, regular rhythm and normal heart sounds. Exam reveals no gallop and no friction rub.  No murmur heard. Pulmonary/Chest: Effort normal and breath sounds normal. No stridor. No respiratory distress. She has no wheezes. She has no rales.  Lymphadenopathy:    She has no cervical adenopathy.  Skin: She is not diaphoretic.  Vitals reviewed.       Assessment & Plan:     1. Acute pansinusitis, recurrence not specified Worsening symptoms that have not responded to OTC medications. Will give augmentin as below. Continue allergy medications. Stay well hydrated and get plenty of rest. Call if no symptom improvement or if  symptoms worsen. - amoxicillin-clavulanate (AUGMENTIN) 875-125 MG tablet; Take 1 tablet by mouth 2 (two) times daily.  Dispense: 20 tablet; Refill: 0  2. Antibiotic-induced yeast infection Diflucan given for yeast infections. Patient states always gets yeast infection with antibiotic use.  - fluconazole (DIFLUCAN) 150 MG tablet; Take 1 tablet (150 mg total) by mouth once for 1 dose. May repeat in 48-72 hrs after 1st if needed  Dispense: 2 tablet; Refill: 0  3. Nausea Zofran sent for nausea patient is having with post nasal drainage.  - ondansetron (ZOFRAN) 4 MG tablet; Take 1 tablet (4 mg total) by mouth every 8 (eight) hours as needed for nausea or vomiting.  Dispense: 20 tablet; Refill: 0       Mar Daring, PA-C  [facility].

## 2017-06-22 NOTE — Patient Instructions (Signed)

## 2017-07-09 ENCOUNTER — Other Ambulatory Visit: Payer: Self-pay | Admitting: Family Medicine

## 2017-07-12 ENCOUNTER — Telehealth: Payer: Self-pay | Admitting: Family Medicine

## 2017-07-12 MED ORDER — DAPAGLIFLOZIN PRO-METFORMIN ER 5-1000 MG PO TB24: 1.0000 | ORAL_TABLET | Freq: Two times a day (BID) | ORAL | 2 refills | Status: DC

## 2017-07-12 NOTE — Telephone Encounter (Signed)
Per [facility] Invokana not [facility], xigduo and synjardy preferred.

## 2017-08-27 ENCOUNTER — Other Ambulatory Visit: Payer: Self-pay | Admitting: Family Medicine

## 2017-09-22 ENCOUNTER — Emergency Department: Payer: BLUE CROSS/BLUE SHIELD

## 2017-09-22 ENCOUNTER — Emergency Department
Admission: EM | Admit: 2017-09-22 | Discharge: 2017-09-22 | Disposition: A | Discharge status: Home / Self Care | Payer: BLUE CROSS/BLUE SHIELD | Attending: Emergency Medicine | Admitting: Emergency Medicine

## 2017-09-22 ENCOUNTER — Encounter: Payer: Self-pay | Admitting: Medical Oncology

## 2017-09-22 DIAGNOSIS — K529 Noninfective gastroenteritis and colitis, unspecified: Secondary | ICD-10-CM

## 2017-09-22 DIAGNOSIS — E785 Hyperlipidemia, unspecified: Secondary | ICD-10-CM | POA: Insufficient documentation

## 2017-09-22 DIAGNOSIS — E1140 Type 2 diabetes mellitus with diabetic neuropathy, unspecified: Secondary | ICD-10-CM | POA: Diagnosis not present

## 2017-09-22 DIAGNOSIS — Z79899 Other long term (current) drug therapy: Secondary | ICD-10-CM | POA: Insufficient documentation

## 2017-09-22 DIAGNOSIS — R1110 Vomiting, unspecified: Secondary | ICD-10-CM | POA: Diagnosis present

## 2017-09-22 DIAGNOSIS — Z7984 Long term (current) use of oral hypoglycemic drugs: Secondary | ICD-10-CM | POA: Diagnosis not present

## 2017-09-22 DIAGNOSIS — I10 Essential (primary) hypertension: Secondary | ICD-10-CM | POA: Diagnosis not present

## 2017-09-22 LAB — URINALYSIS, COMPLETE (UACMP) WITH MICROSCOPIC
BACTERIA UA: NONE SEEN
BILIRUBIN URINE: NEGATIVE
HGB URINE DIPSTICK: NEGATIVE
Ketones, ur: 5 mg/dL — AB
LEUKOCYTES UA: NEGATIVE
NITRITE: NEGATIVE
Protein, ur: NEGATIVE mg/dL
SPECIFIC GRAVITY, URINE: 1.035 — AB (ref 1.005–1.030)
WBC, UA: NONE SEEN WBC/hpf (ref 0–5)
pH: 5 (ref 5.0–8.0)

## 2017-09-22 LAB — COMPREHENSIVE METABOLIC PANEL
ALK PHOS: 112 U/L (ref 38–126)
ALT: 24 U/L (ref 14–54)
ANION GAP: 12 (ref 5–15)
AST: 21 U/L (ref 15–41)
Albumin: 4.7 g/dL (ref 3.5–5.0)
BILIRUBIN TOTAL: 0.7 mg/dL (ref 0.3–1.2)
BUN: 22 mg/dL — AB (ref 6–20)
CALCIUM: 9.5 mg/dL (ref 8.9–10.3)
CO2: 26 mmol/L (ref 22–32)
Chloride: 99 mmol/L — ABNORMAL LOW (ref 101–111)
Creatinine, Ser: 0.59 mg/dL (ref 0.44–1.00)
GFR calc Af Amer: >60 mL/min (ref >60)
Glucose, Bld: 369 mg/dL — ABNORMAL HIGH (ref 65–99)
POTASSIUM: 3.6 mmol/L (ref 3.5–5.1)
Sodium: 137 mmol/L (ref 135–145)
TOTAL PROTEIN: 7.6 g/dL (ref 6.5–8.1)

## 2017-09-22 LAB — CBC
HEMATOCRIT: 42.3 % (ref 35.0–47.0)
Hemoglobin: 14.1 g/dL (ref 12.0–16.0)
MCH: 27.1 pg (ref 26.0–34.0)
MCHC: 33.5 g/dL (ref 32.0–36.0)
MCV: 80.9 fL (ref 80.0–100.0)
Platelets: 218 10*3/uL (ref 150–440)
RBC: 5.22 MIL/uL — ABNORMAL HIGH (ref 3.80–5.20)
RDW: 13.5 % (ref 11.5–14.5)
WBC: 12.2 10*3/uL — AB (ref 3.6–11.0)

## 2017-09-22 LAB — LIPASE, BLOOD: Lipase: 42 U/L (ref 11–51)

## 2017-09-22 MED ORDER — ONDANSETRON 4 MG PO TBDP: 4.0000 mg | ORAL_TABLET | Freq: Three times a day (TID) | ORAL | 0 refills | Status: DC | PRN

## 2017-09-22 MED ORDER — IOPAMIDOL (ISOVUE-300) INJECTION 61%
100.0000 mL | Freq: Once | INTRAVENOUS | Status: AC | PRN
  Administered 2017-09-22: 100 mL via INTRAVENOUS

## 2017-09-22 MED ORDER — ACETAMINOPHEN 325 MG PO TABS
650.0000 mg | ORAL_TABLET | Freq: Once | ORAL | Status: AC
  Administered 2017-09-22: 650 mg via ORAL
  Filled 2017-09-22: qty 2

## 2017-09-22 MED ORDER — SODIUM CHLORIDE 0.9 % IV BOLUS
1000.0000 mL | Freq: Once | INTRAVENOUS | Status: AC
  Administered 2017-09-22: 1000 mL via INTRAVENOUS

## 2017-09-22 MED ORDER — CIPROFLOXACIN HCL 500 MG PO TABS: 500.0000 mg | ORAL_TABLET | Freq: Every day | ORAL | 0 refills | Status: AC

## 2017-09-22 MED ORDER — ONDANSETRON HCL 4 MG/2ML IJ SOLN
4.0000 mg | Freq: Once | INTRAMUSCULAR | Status: AC
  Administered 2017-09-22: 4 mg via INTRAVENOUS
  Filled 2017-09-22: qty 2

## 2017-09-22 NOTE — ED Provider Notes (Signed)
[facility] Emergency Department Provider Note    First MD Initiated Contact with Patient 09/22/17 [phone]     (approximate)  I have reviewed the triage vital signs and the nursing notes.   HISTORY  Chief Complaint Diarrhea and Emesis    HPI Katie Buckley is a 67 y.o. female below list of chronic medical conditions presents to the emergency department with 1 day history of multiple episodes of nonbloody vomiting and diarrhea with associated nausea.  Patient admits to generalized abdominal cramping.  She states that she took Pepto-Bismol this morning however symptoms have persisted.  Including worsening vomiting.  Past Medical History:  Diagnosis Date  . Anxiety   . Depression   . Diabetes mellitus without complication ([place])   . GERD (gastroesophageal reflux disease) 04/21/2015   History of Barrett's esophagus   . Heart murmur   . Seasonal allergies   . Skin cancer    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    Patient Active Problem List   Diagnosis Date Noted  . Uncontrolled type 2 diabetes mellitus with diabetic neuropathy, without long-term current use of insulin ([place]) 04/20/2017  . H/O adenomatous polyp of colon 08/20/2015  . Rupture of implant of right breast 05/18/2015  . Anxiety disorder 04/26/2015  . Allergic rhinitis 04/21/2015  . Anemia 04/21/2015  . Asthma 04/21/2015  . 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 Chronic infection of sinus 04/21/2015  . Depression 04/21/2015  . GERD (gastroesophageal reflux disease) 04/21/2015  . Headache 04/21/2015  . Menopausal symptoms 04/21/2015  . Obesity 04/21/2015  . Palpitations 04/21/2015  . Vitamin B12 deficiency 04/21/2015  . Aortic stenosis 01/21/2013  . Diastolic dysfunction 00/17/4944  . Diabetes mellitus out of control ([place]) 06/12/2006  . Barrett esophagus 06/13/2003  . Essential hypertension 06/12/1998  . Genital herpes 06/12/1998  . Hyperlipidemia, mixed 06/12/1998    Past  Surgical History:  Procedure Laterality Date  . AUGMENTATION MAMMAPLASTY Bilateral    breast implants  . Bone Spur  2007   foot  . Carpal Tunnel Symdrome  2008   as repeated in 2009  . CHOLECYSTECTOMY  2007  . COLONOSCOPY WITH PROPOFOL N/A 11/17/2015   Procedure: COLONOSCOPY WITH PROPOFOL;  Surgeon: Manya Silvas, MD;  Location: [facility] ENDOSCOPY;  Service: Endoscopy;  Laterality: N/A;  . ESOPHAGOGASTRODUODENOSCOPY (EGD) WITH PROPOFOL N/A 11/17/2015   Procedure: ESOPHAGOGASTRODUODENOSCOPY (EGD) WITH PROPOFOL;  Surgeon: Manya Silvas, MD;  Location: [facility] ENDOSCOPY;  Service: Endoscopy;  Laterality: N/A;  . MANDIBLE SURGERY    . NASAL SINUS SURGERY  03/2013   Multile surgery Dr. Carlis Abbott. 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 Release of Trigger Finger Right 10/2010   Dr. Tamala Julian  . TONSILLECTOMY  1960    Prior to Admission medications   Medication Sig Start Date End Date Taking? Authorizing Provider  amitriptyline (ELAVIL) 10 MG tablet TAKE UP TO 3 TABLETS BY MOUTH EVERY DAY AT BEDTIME**NEEDS OFFICE VISIT** 02/08/17   [name], MD  atorvastatin (LIPITOR) 80 MG tablet TAKE 1 TABLET BY MOUTH EVERY DAY 05/16/17   [name], MD  butalbital-acetaminophen-caffeine (FIORICET, ESGIC) [phone] MG tablet TAKE 1 TABLET BY MOUTH EVERY 6 HOURS AS NEEDED 07/10/17   [name], MD  ciprofloxacin (CIPRO) 500 MG tablet Take 1 tablet (500 mg total) by mouth daily with breakfast for 7 days. 09/22/17 09/29/17  Gregor Hams, MD  Dapagliflozin-Metformin HCl ER (XIGDUO XR) 10-998 MG TB24 Take 1 tablet by mouth 2 (two)  times daily. 07/12/17   [name], MD  Dulaglutide (TRULICITY) 1.5 EX/5.2WU SOPN Inject 1.5 mg once a week into the skin. 04/19/17   [name], MD  esomeprazole (NEXIUM) 40 MG capsule TAKE 1 CAPSULE (40 MG TOTAL) BY MOUTH DAILY. 04/18/17   [name], MD  ezetimibe (ZETIA) 10 MG tablet Take 1 tablet (10 mg total) by mouth daily. 05/02/17   [name], MD    fluticasone (FLONASE) 50 MCG/ACT nasal spray Place 2 sprays into both nostrils daily. Patient not taking: Reported on 06/22/2017 05/28/15   [name], MD  gabapentin (NEURONTIN) 300 MG capsule TAKE 1-2 CAPSULES BY MOUTH 2 TIMES A DAY 11/25/16   [name], MD  glipiZIDE (GLUCOTROL) 10 MG tablet TAKE 1 TABLET BY MOUTH EVERY DAY 05/07/17   [name], MD  Lancet Devices MISC     [provider]  meloxicam (MOBIC) 15 MG tablet Take 1 tablet (15 mg total) by mouth daily. Patient not taking: Reported on 06/22/2017 05/07/16   Hagler, Jami L, PA-C  metoprolol-hydrochlorothiazide (LOPRESSOR HCT) 100-25 MG tablet TAKE 1/2 TABLET BY MOUTH EVERY DAY 08/27/17   [name], MD  montelukast (SINGULAIR) 10 MG tablet TAKE 1 TABLET BY MOUTH EVERY DAY 06/07/17   [name], MD  Olopatadine HCl (PATADAY) 0.2 % SOLN Apply to eye.    [provider]  ondansetron (ZOFRAN ODT) 4 MG disintegrating tablet Take 1 tablet (4 mg total) by mouth every 8 (eight) hours as needed for nausea or vomiting. 09/22/17   Gregor Hams, MD  ondansetron (ZOFRAN) 4 MG tablet Take 1 tablet (4 mg total) by mouth every 8 (eight) hours as needed for nausea or vomiting. 06/22/17   Mar Daring, PA-C  sertraline (ZOLOFT) 50 MG tablet Take 1 tablet (50 mg total) by mouth daily. 02/17/16   [name], MD  valACYclovir (VALTREX) 1000 MG tablet Take 1 tablet daily for 5 days as needed Patient not taking: Reported on 06/22/2017 02/17/16   [name], MD  valsartan-hydrochlorothiazide (DIOVAN-HCT) 320-12.5 MG tablet TAKE 1 TABLET BY MOUTH DAILY 02/20/17   [name], MD  zolpidem (AMBIEN) 5 MG tablet Take 5 mg by mouth once.     [provider]    Allergies Biaxin  [clarithromycin]  Family History  Problem Relation Age of Onset  . Diabetes Father   . Breast cancer Maternal Aunt     Social History Social History   Tobacco Use  . Smoking status: Never Smoker  .  Smokeless tobacco: Never Used  Substance Use Topics  . Alcohol use: No  . Drug use: No    Review of Systems Constitutional: No fever/chills Eyes: No visual changes. ENT: No sore throat. Cardiovascular: Denies chest pain. Respiratory: Denies shortness of breath. Gastrointestinal: Positive for generalized abdominal cramping, nausea vomiting and diarrhea. Genitourinary: Negative for dysuria. Musculoskeletal: Negative for neck pain.  Negative for back pain. Integumentary: Negative for rash. Neurological: Negative for headaches, focal weakness or numbness.   ____________________________________________   PHYSICAL EXAM:  VITAL SIGNS: ED Triage Vitals  Enc Vitals Group     BP 09/22/17 0710 (!) 195/76     Pulse Rate 09/22/17 0710 (!) 140     Resp 09/22/17 0710 18     Temp 09/22/17 0710 98.2 F (36.8 C)     Temp Source 09/22/17 0710 Oral     SpO2 09/22/17 0710 96 %     Weight  09/22/17 0711 70.3 kg (155 lb)     Height 09/22/17 0711 1.524 m (5')     Head Circumference --      Peak Flow --      Pain Score 09/22/17 0710 7     Pain Loc --      Pain Edu? --      Excl. in [place]? --     Constitutional: Alert and oriented. Well appearing and in no acute distress. Eyes: Conjunctivae are normal.  Mouth/Throat: Mucous membranes are dry. Oropharynx non-erythematous. Neck: No stridor.  Cardiovascular: Normal rate, regular rhythm. Good peripheral circulation. Grossly normal heart sounds. Respiratory: Normal respiratory effort.  No retractions. Lungs CTAB. Gastrointestinal: Generalized tenderness to palpation.  No distention.  Musculoskeletal: No lower extremity tenderness nor edema. No gross deformities of extremities. Neurologic:  Normal speech and language. No gross focal neurologic deficits are appreciated.  Skin:  Skin is warm, dry and intact. No rash noted. Psychiatric: Mood and affect are normal. Speech and behavior are normal.  ____________________________________________    LABS (all labs ordered are listed, but only abnormal results are displayed)  Labs Reviewed  COMPREHENSIVE METABOLIC PANEL - Abnormal; Notable for the following components:      Result Value   Chloride 99 (*)    Glucose, Bld 369 (*)    BUN 22 (*)    All other components within normal limits  CBC - Abnormal; Notable for the following components:   WBC 12.2 (*)    RBC 5.22 (*)    All other components within normal limits  URINALYSIS, COMPLETE (UACMP) WITH MICROSCOPIC - Abnormal; Notable for the following components:   Color, Urine STRAW (*)    APPearance CLEAR (*)    Specific Gravity, Urine 1.035 (*)    Glucose, UA >=500 (*)    Ketones, ur 5 (*)    Squamous Epithelial / LPF 0-5 (*)    All other components within normal limits  GASTROINTESTINAL PANEL BY PCR, STOOL (REPLACES STOOL CULTURE)  LIPASE, BLOOD   ____________________________________________  EKG ED ECG REPORT I, Spofford N BROWN, the attending physician, personally viewed and interpreted this ECG.   Date: 09/22/2017  EKG Time: 7:14 AM  Rate: 132  Rhythm: Sinus tachycardia  Axis: Normal  Intervals: Normal  ST&T Change: None    RADIOLOGY I,  N BROWN, personally viewed and evaluated these images (plain radiographs) as part of my medical decision making, as well as reviewing the written report by the radiologist.  ED MD interpretation: Symptoms suspicious for enteritis per radiologist.  Official radiology report(s): Ct Abdomen Pelvis W Contrast  Result Date: 09/22/2017 CLINICAL DATA:  Diarrhea and vomiting. Abdominal cramping. History of skin cancer. EXAM: CT ABDOMEN AND PELVIS WITH CONTRAST TECHNIQUE: Multidetector CT imaging of the abdomen and pelvis was performed using the standard protocol following bolus administration of intravenous contrast. CONTRAST:  133mL ISOVUE-300 IOPAMIDOL (ISOVUE-300) INJECTION 61% COMPARISON:  CT abdomen dated 08/03/2016. FINDINGS: Lower chest: No acute abnormality.  Hepatobiliary: No focal liver lesion. Liver is low in density suggesting fatty infiltration. Status post cholecystectomy. No bile duct dilatation. Pancreas: Unremarkable. No pancreatic ductal dilatation or surrounding inflammatory changes. Spleen: Normal in size without focal abnormality. Adrenals/Urinary Tract: Adrenal glands appear normal. Kidneys are unremarkable without mass, stone or hydronephrosis. No perinephric inflammation. No ureteral or bladder calculi identified. Bladder appears normal. Stomach/Bowel: Bowel is normal in caliber. No definite bowel wall thickening or evidence of bowel wall inflammation seen. Questionable thickening of the walls of the duodenum and jejunum  raising the possibility of small bowel enteritis. Appendix is normal. Stomach appears normal. Vascular/Lymphatic: Aortic atherosclerosis. No enlarged abdominal or pelvic lymph nodes. Reproductive: Uterus and bilateral adnexa are unremarkable. Other: No free fluid or abscess collection. No free intraperitoneal air. Musculoskeletal: Degenerative spondylitic changes throughout the slightly scoliotic thoracolumbar spine, mild in degree. No acute or suspicious osseous finding. IMPRESSION: 1. Questionable thickening of the walls of the duodenum and jejunum, suspicious for enteritis of infectious or inflammatory nature, but no associated mesenteric inflammation/fluid stranding to confirm enteritis. 2. No bowel obstruction. 3. Mild fatty infiltration of the liver. 4. Aortic atherosclerosis. Electronically Signed   By: Franki Cabot M.D.   On: 09/22/2017 08:43     Procedures   ____________________________________________   INITIAL IMPRESSION / ASSESSMENT AND PLAN / ED COURSE  As part of my medical decision making, I reviewed the following data within the electronic MEDICAL RECORD NUMBER   67 year old female presented to the emergency department above-stated history and physical exam of vomiting diarrhea and generalized abdominal  discomfort.  Suspect infectious etiology and as such patient received 2 L IV normal saline and IV Zofran.  Plan to obtain a stool sample however patient was unable to give a stool sample while in the emergency department.  Patient's heart rate markedly improved following 2 L of IV normal saline.  No further vomiting.  Patient was able to tolerate p.o. with before being discharged from the emergency department.  Spoke with the patient at length regarding gastroenteritis and the management.  CT scan of the abdomen pelvis was consistent with enteritis ____________________________________________  FINAL CLINICAL IMPRESSION(S) / ED DIAGNOSES  Final diagnoses:  Gastroenteritis     MEDICATIONS GIVEN DURING THIS VISIT:  Medications  ondansetron (ZOFRAN) injection 4 mg (4 mg Intravenous Given 09/22/17 0721)  sodium chloride 0.9 % bolus 1,000 mL (0 mLs Intravenous Stopped 09/22/17 1030)  sodium chloride 0.9 % bolus 1,000 mL (0 mLs Intravenous Stopped 09/22/17 1030)  ondansetron (ZOFRAN) injection 4 mg (4 mg Intravenous Given 09/22/17 0814)  acetaminophen (TYLENOL) tablet 650 mg (650 mg Oral Given 09/22/17 0813)  iopamidol (ISOVUE-300) 61 % injection 100 mL (100 mLs Intravenous Contrast Given 09/22/17 0828)     ED Discharge Orders        Ordered    ondansetron (ZOFRAN ODT) 4 MG disintegrating tablet  Every 8 hours PRN     09/22/17 1034    ciprofloxacin (CIPRO) 500 MG tablet  Daily with breakfast     09/22/17 1035       Note:  This document was prepared using Dragon voice recognition software and may include unintentional dictation errors.    Gregor Hams, MD 09/22/17 [phone]

## 2017-09-22 NOTE — ED Triage Notes (Signed)
Pt reports yesterday she began having diarrhea and last night she woke up vomiting. Reports abdominal cramping.

## 2017-09-26 ENCOUNTER — Other Ambulatory Visit: Payer: Self-pay | Admitting: Physician Assistant

## 2017-09-26 DIAGNOSIS — B379 Candidiasis, unspecified: Secondary | ICD-10-CM

## 2017-09-26 DIAGNOSIS — T3695XA Adverse effect of unspecified systemic antibiotic, initial encounter: Principal | ICD-10-CM

## 2017-09-28 ENCOUNTER — Other Ambulatory Visit: Payer: Self-pay | Admitting: Family Medicine

## 2017-10-03 ENCOUNTER — Encounter: Payer: Self-pay | Admitting: Emergency Medicine

## 2017-10-03 ENCOUNTER — Emergency Department
Admission: EM | Admit: 2017-10-03 | Discharge: 2017-10-03 | Disposition: A | Discharge status: Home / Self Care | Payer: BLUE CROSS/BLUE SHIELD | Attending: Emergency Medicine | Admitting: Emergency Medicine

## 2017-10-03 ENCOUNTER — Other Ambulatory Visit: Payer: Self-pay

## 2017-10-03 DIAGNOSIS — Z7984 Long term (current) use of oral hypoglycemic drugs: Secondary | ICD-10-CM | POA: Insufficient documentation

## 2017-10-03 DIAGNOSIS — E1140 Type 2 diabetes mellitus with diabetic neuropathy, unspecified: Secondary | ICD-10-CM | POA: Diagnosis not present

## 2017-10-03 DIAGNOSIS — J45909 Unspecified asthma, uncomplicated: Secondary | ICD-10-CM | POA: Insufficient documentation

## 2017-10-03 DIAGNOSIS — J0101 Acute recurrent maxillary sinusitis: Secondary | ICD-10-CM | POA: Diagnosis not present

## 2017-10-03 DIAGNOSIS — H6690 Otitis media, unspecified, unspecified ear: Secondary | ICD-10-CM

## 2017-10-03 DIAGNOSIS — Z79899 Other long term (current) drug therapy: Secondary | ICD-10-CM | POA: Diagnosis not present

## 2017-10-03 DIAGNOSIS — H9202 Otalgia, left ear: Secondary | ICD-10-CM | POA: Insufficient documentation

## 2017-10-03 DIAGNOSIS — I10 Essential (primary) hypertension: Secondary | ICD-10-CM | POA: Insufficient documentation

## 2017-10-03 DIAGNOSIS — R0981 Nasal congestion: Secondary | ICD-10-CM | POA: Diagnosis present

## 2017-10-03 DIAGNOSIS — H6692 Otitis media, unspecified, left ear: Secondary | ICD-10-CM | POA: Diagnosis not present

## 2017-10-03 HISTORY — DX: Chronic sinusitis, unspecified: J32.9

## 2017-10-03 MED ORDER — FLUTICASONE PROPIONATE 50 MCG/ACT NA SUSP: 2.0000 | Freq: Every day | NASAL | 0 refills | Status: DC

## 2017-10-03 MED ORDER — AMOXICILLIN-POT CLAVULANATE 875-125 MG PO TABS
1.0000 | ORAL_TABLET | Freq: Once | ORAL | Status: AC
  Administered 2017-10-03: 1 via ORAL
  Filled 2017-10-03: qty 1

## 2017-10-03 MED ORDER — ACETAMINOPHEN-CODEINE #3 300-30 MG PO TABS: 1.0000 | ORAL_TABLET | Freq: Four times a day (QID) | ORAL | 0 refills | Status: AC | PRN

## 2017-10-03 MED ORDER — AMOXICILLIN-POT CLAVULANATE 875-125 MG PO TABS: 1.0000 | ORAL_TABLET | Freq: Two times a day (BID) | ORAL | 0 refills | Status: AC

## 2017-10-03 NOTE — ED Triage Notes (Signed)
Pt presents to ED with sinus congestion for about a week with left ear pain that started yesterday. Fever of 101 intermittently.

## 2017-10-03 NOTE — ED Provider Notes (Signed)
[facility] Emergency Department Provider Note  ____________________________________________  Time seen: Approximately 7:09 AM  I have reviewed the triage vital signs and the nursing notes.   HISTORY  Chief Complaint Nasal Congestion and Otalgia    HPI Katie Buckley is a 67 y.o. female that presents to the emergency department for evaluation of low-grade fever, sinus congestion, left ear pain for 1 week.  She originally thought it was allergies but symptoms have been getting worse.  Patient states that she gets frequent sinus infections.  She has seen Dr. Pryor Ochoa for sinus infections in the past. No sick contacts.  No cough, shortness of breath.  Past Medical History:  Diagnosis Date  . Anxiety   . Depression   . Diabetes mellitus without complication ([place])   . Frequent sinus infections   . GERD (gastroesophageal reflux disease) 04/21/2015   History of Barrett's esophagus   . Heart murmur   . Seasonal allergies   . Skin cancer    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    Patient Active Problem List   Diagnosis Date Noted  . Uncontrolled type 2 diabetes mellitus with diabetic neuropathy, without long-term current use of insulin ([place]) 04/20/2017  . H/O adenomatous polyp of colon 08/20/2015  . Rupture of implant of right breast 05/18/2015  . Anxiety disorder 04/26/2015  . Allergic rhinitis 04/21/2015  . Anemia 04/21/2015  . Asthma 04/21/2015  . 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 Chronic infection of sinus 04/21/2015  . Depression 04/21/2015  . GERD (gastroesophageal reflux disease) 04/21/2015  . Headache 04/21/2015  . Menopausal symptoms 04/21/2015  . Obesity 04/21/2015  . Palpitations 04/21/2015  . Vitamin B12 deficiency 04/21/2015  . Aortic stenosis 01/21/2013  . Diastolic dysfunction 41/66/0630  . Diabetes mellitus out of control ([place]) 06/12/2006  . Barrett esophagus 06/13/2003  . Essential hypertension 06/12/1998  . Genital herpes  06/12/1998  . Hyperlipidemia, mixed 06/12/1998    Past Surgical History:  Procedure Laterality Date  . AUGMENTATION MAMMAPLASTY Bilateral    breast implants  . Bone Spur  2007   foot  . Carpal Tunnel Symdrome  2008   as repeated in 2009  . CHOLECYSTECTOMY  2007  . COLONOSCOPY WITH PROPOFOL N/A 11/17/2015   Procedure: COLONOSCOPY WITH PROPOFOL;  Surgeon: Manya Silvas, MD;  Location: [facility] ENDOSCOPY;  Service: Endoscopy;  Laterality: N/A;  . ESOPHAGOGASTRODUODENOSCOPY (EGD) WITH PROPOFOL N/A 11/17/2015   Procedure: ESOPHAGOGASTRODUODENOSCOPY (EGD) WITH PROPOFOL;  Surgeon: Manya Silvas, MD;  Location: [facility] ENDOSCOPY;  Service: Endoscopy;  Laterality: N/A;  . MANDIBLE SURGERY    . NASAL SINUS SURGERY  03/2013   Multile surgery Dr. Carlis Abbott. 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 Release of Trigger Finger Right 10/2010   Dr. Tamala Julian  . TONSILLECTOMY  1960    Prior to Admission medications   Medication Sig Start Date End Date Taking? Authorizing Provider  acetaminophen-codeine (TYLENOL #3) 300-30 MG tablet Take 1 tablet by mouth every 6 (six) hours as needed for up to 2 days for moderate pain. 10/03/17 10/05/17  Laban Emperor, PA-C  amitriptyline (ELAVIL) 10 MG tablet TAKE UP TO 3 TABLETS BY MOUTH EVERY DAY AT BEDTIME**NEEDS OFFICE VISIT** 02/08/17   [name], MD  amoxicillin-clavulanate (AUGMENTIN) 875-125 MG tablet Take 1 tablet by mouth 2 (two) times daily for 10 days. 10/03/17 10/13/17  Laban Emperor, PA-C  atorvastatin (LIPITOR) 80 MG tablet TAKE 1 TABLET BY MOUTH EVERY DAY 05/16/17   [name], MD  butalbital-acetaminophen-caffeine (FIORICET, ESGIC) 50-325-40 MG tablet TAKE 1 TABLET BY MOUTH EVERY 6 HOURS AS NEEDED 07/10/17   [name], MD  Dapagliflozin-Metformin HCl ER (XIGDUO XR) 10-998 MG TB24 Take 1 tablet by mouth 2 (two) times daily. 07/12/17   [name], MD  Dulaglutide (TRULICITY) 1.5 ID/7.8EU SOPN Inject 1.5 mg once a week into the skin. 04/19/17   [name], MD  esomeprazole (NEXIUM) 40 MG capsule TAKE 1 CAPSULE (40 MG TOTAL) BY MOUTH DAILY. 04/18/17   [name], MD  ezetimibe (ZETIA) 10 MG tablet TAKE 1 TABLET BY MOUTH EVERY DAY 09/28/17   [name], MD  fluticasone [facility]) 50 MCG/ACT nasal spray Place 2 sprays into both nostrils daily. 10/03/17 10/03/18  Laban Emperor, PA-C  gabapentin (NEURONTIN) 300 MG capsule TAKE 1-2 CAPSULES BY MOUTH 2 TIMES A DAY 11/25/16   [name], MD  glipiZIDE (GLUCOTROL) 10 MG tablet TAKE 1 TABLET BY MOUTH EVERY DAY 05/07/17   [name], MD  Lancet Devices MISC     [provider]  meloxicam (MOBIC) 15 MG tablet Take 1 tablet (15 mg total) by mouth daily. Patient not taking: Reported on 06/22/2017 05/07/16   Hagler, Jami L, PA-C  metoprolol-hydrochlorothiazide (LOPRESSOR HCT) 100-25 MG tablet TAKE 1/2 TABLET BY MOUTH EVERY DAY 08/27/17   [name], MD  montelukast (SINGULAIR) 10 MG tablet TAKE 1 TABLET BY MOUTH EVERY DAY 06/07/17   [name], MD  Olopatadine HCl (PATADAY) 0.2 % SOLN Apply to eye.    [provider]  ondansetron (ZOFRAN ODT) 4 MG disintegrating tablet Take 1 tablet (4 mg total) by mouth every 8 (eight) hours as needed for nausea or vomiting. 09/22/17   Gregor Hams, MD  ondansetron (ZOFRAN) 4 MG tablet Take 1 tablet (4 mg total) by mouth every 8 (eight) hours as needed for nausea or vomiting. 06/22/17   Mar Daring, PA-C  sertraline (ZOLOFT) 50 MG tablet Take 1 tablet (50 mg total) by mouth daily. 02/17/16   [name], MD  valACYclovir (VALTREX) 1000 MG tablet Take 1 tablet daily for 5 days as needed Patient not taking: Reported on 06/22/2017 02/17/16   [name], MD  valsartan-hydrochlorothiazide (DIOVAN-HCT) 320-12.5 MG tablet TAKE 1 TABLET BY MOUTH DAILY 02/20/17   [name], MD  zolpidem (AMBIEN) 5 MG tablet Take 5 mg by mouth once.     [provider]    Allergies Biaxin   [clarithromycin]  Family History  Problem Relation Age of Onset  . Diabetes Father   . Breast cancer Maternal Aunt     Social History Social History   Tobacco Use  . Smoking status: Never Smoker  . Smokeless tobacco: Never Used  Substance Use Topics  . Alcohol use: No  . Drug use: No     Review of Systems  Constitutional: No fever/chills Eyes: No visual changes. No discharge. ENT: Positive for congestion and rhinorrhea. Cardiovascular: No chest pain. Respiratory: Negative for cough. No SOB. Gastrointestinal: No abdominal pain.  No nausea, no vomiting.  No diarrhea.  No constipation. Musculoskeletal: Negative for musculoskeletal pain. Skin: Negative for rash, abrasions, lacerations, ecchymosis. Neurological: Negative for headaches.   ____________________________________________   PHYSICAL EXAM:  VITAL SIGNS: ED Triage Vitals  Enc Vitals Group     BP 10/03/17 0400 (!) 141/56     Pulse Rate 10/03/17 0400 86     Resp 10/03/17 0400 16  Temp 10/03/17 0400 (!) 97.5 F (36.4 C)     Temp Source 10/03/17 0400 Oral     SpO2 10/03/17 0400 99 %     Weight 10/03/17 0404 155 lb (70.3 kg)     Height 10/03/17 0404 5' (1.524 m)     Head Circumference --      Peak Flow --      Pain Score 10/03/17 0404 10     Pain Loc --      Pain Edu? --      Excl. in [place]? --      Constitutional: Alert and oriented. Well appearing and in no acute distress. Eyes: Conjunctivae are normal. PERRL. EOMI. No discharge. Head: Atraumatic. ENT: No frontal and maxillary sinus tenderness.      Ears: Left tympanic membrane erythematous and bulging. No discharge.      Nose: Moderate congestion/rhinnorhea.      Mouth/Throat: Mucous membranes are moist. Oropharynx non-erythematous. Tonsils not enlarged. No exudates. Uvula midline. Neck: No stridor.   Hematological/Lymphatic/Immunilogical: No cervical lymphadenopathy. Cardiovascular: Normal rate, regular rhythm.  Good peripheral  circulation. Respiratory: Normal respiratory effort without tachypnea or retractions. Lungs CTAB. Good air entry to the bases with no decreased or absent breath sounds. Gastrointestinal: Bowel sounds 4 quadrants. Soft and nontender to palpation. No guarding or rigidity. No palpable masses. No distention. Musculoskeletal: Full range of motion to all extremities. No gross deformities appreciated. Neurologic:  Normal speech and language. No gross focal neurologic deficits are appreciated.  Skin:  Skin is warm, dry and intact. No rash noted.    ____________________________________________   LABS (all labs ordered are listed, but only abnormal results are displayed)  Labs Reviewed - No data to display ____________________________________________  EKG   ____________________________________________  RADIOLOGY   No results found.  ____________________________________________    PROCEDURES  Procedure(s) performed:    Procedures    Medications  amoxicillin-clavulanate (AUGMENTIN) 875-125 MG per tablet 1 tablet (1 tablet Oral Given 10/03/17 0739)     ____________________________________________   INITIAL IMPRESSION / ASSESSMENT AND PLAN / ED COURSE  Pertinent labs & imaging results that were available during my care of the patient were reviewed by me and considered in my medical decision making (see chart for details).  Review of the  [place]RS was performed in accordance of the [place] prior to dispensing any controlled drugs.   Patient's diagnosis is consistent with sinus infection and otitis media. Vital signs and exam are reassuring. Patient appears well and is staying well hydrated. Patient should alternate tylenol and ibuprofen for fever. Patient feels comfortable going home. Patient will be discharged home with prescriptions for Augmentin, flonase, a short course of Tylenol 3. Patient is to follow up with PCP or ENT as needed or otherwise directed. She has seen Dr. Pryor Ochoa  in the past. Patient is given ED precautions to return to the ED for any worsening or new symptoms.     ____________________________________________  FINAL CLINICAL IMPRESSION(S) / ED DIAGNOSES  Final diagnoses:  Acute otitis media, unspecified otitis media type  Acute recurrent maxillary sinusitis      NEW MEDICATIONS STARTED DURING THIS VISIT:  ED Discharge Orders        Ordered    amoxicillin-clavulanate (AUGMENTIN) 875-125 MG tablet  2 times daily     10/03/17 0719    fluticasone (FLONASE) 50 MCG/ACT nasal spray  Daily     10/03/17 0719    acetaminophen-codeine (TYLENOL #3) 300-30 MG tablet  Every 6 hours PRN  10/03/17 0731          This chart was dictated using voice recognition software/Dragon. Despite best efforts to proofread, errors can occur which can change the meaning. Any change was purely unintentional.    Laban Emperor, PA-C 10/03/17 5909    Nance Pear, MD 10/03/17 1240

## 2017-10-21 ENCOUNTER — Other Ambulatory Visit: Payer: Self-pay | Admitting: Family Medicine

## 2017-10-24 LAB — HM DIABETES EYE EXAM

## 2017-10-25 ENCOUNTER — Other Ambulatory Visit: Payer: Self-pay | Admitting: Family Medicine

## 2017-10-25 DIAGNOSIS — IMO0002 Reserved for concepts with insufficient information to code with codable children: Secondary | ICD-10-CM

## 2017-10-25 DIAGNOSIS — E1165 Type 2 diabetes mellitus with hyperglycemia: Principal | ICD-10-CM

## 2017-10-25 DIAGNOSIS — E1140 Type 2 diabetes mellitus with diabetic neuropathy, unspecified: Secondary | ICD-10-CM

## 2017-10-26 ENCOUNTER — Encounter: Payer: Self-pay | Admitting: *Deleted

## 2017-10-30 ENCOUNTER — Other Ambulatory Visit: Payer: Self-pay | Admitting: Otolaryngology

## 2017-10-30 DIAGNOSIS — R42 Dizziness and giddiness: Secondary | ICD-10-CM

## 2017-10-31 ENCOUNTER — Encounter: Payer: Self-pay | Admitting: *Deleted

## 2017-11-04 ENCOUNTER — Other Ambulatory Visit: Payer: Self-pay | Admitting: Family Medicine

## 2017-11-04 DIAGNOSIS — E1165 Type 2 diabetes mellitus with hyperglycemia: Principal | ICD-10-CM

## 2017-11-04 DIAGNOSIS — E1140 Type 2 diabetes mellitus with diabetic neuropathy, unspecified: Secondary | ICD-10-CM

## 2017-11-04 DIAGNOSIS — IMO0002 Reserved for concepts with insufficient information to code with codable children: Secondary | ICD-10-CM

## 2017-11-09 ENCOUNTER — Ambulatory Visit
Admission: RE | Admit: 2017-11-09 | Discharge: 2017-11-09 | Disposition: A | Discharge status: Home / Self Care | Payer: BLUE CROSS/BLUE SHIELD | Source: Ambulatory Visit | Attending: Otolaryngology | Admitting: Otolaryngology

## 2017-11-09 DIAGNOSIS — H9042 Sensorineural hearing loss, unilateral, left ear, with unrestricted hearing on the contralateral side: Secondary | ICD-10-CM | POA: Diagnosis present

## 2017-11-09 DIAGNOSIS — R42 Dizziness and giddiness: Secondary | ICD-10-CM | POA: Diagnosis not present

## 2017-11-09 DIAGNOSIS — M2548 Effusion, other site: Secondary | ICD-10-CM | POA: Diagnosis not present

## 2017-11-09 LAB — POCT I-STAT CREATININE: CREATININE: 0.7 mg/dL (ref 0.44–1.00)

## 2017-11-09 MED ORDER — GADOBENATE DIMEGLUMINE 529 MG/ML IV SOLN
15.0000 mL | Freq: Once | INTRAVENOUS | Status: AC | PRN
  Administered 2017-11-09: 14 mL via INTRAVENOUS

## 2017-11-11 ENCOUNTER — Other Ambulatory Visit: Payer: Self-pay | Admitting: Family Medicine

## 2017-11-13 ENCOUNTER — Other Ambulatory Visit: Payer: Self-pay | Admitting: Family Medicine

## 2017-11-13 ENCOUNTER — Encounter: Payer: Self-pay | Admitting: Family Medicine

## 2017-11-13 ENCOUNTER — Ambulatory Visit (INDEPENDENT_AMBULATORY_CARE_PROVIDER_SITE_OTHER): Payer: Medicare Other | Admitting: Family Medicine

## 2017-11-13 VITALS — BP 148/70 | HR 95 | Temp 98.9°F | Resp 16 | Wt 148.0 lb

## 2017-11-13 DIAGNOSIS — E1165 Type 2 diabetes mellitus with hyperglycemia: Secondary | ICD-10-CM

## 2017-11-13 DIAGNOSIS — S80812A Abrasion, left lower leg, initial encounter: Principal | ICD-10-CM

## 2017-11-13 DIAGNOSIS — L089 Local infection of the skin and subcutaneous tissue, unspecified: Secondary | ICD-10-CM | POA: Diagnosis not present

## 2017-11-13 LAB — POCT GLYCOSYLATED HEMOGLOBIN (HGB A1C)
ESTIMATED AVERAGE GLUCOSE: 275
Hemoglobin A1C: 11.2 % — AB (ref 4.0–5.6)

## 2017-11-13 MED ORDER — MUPIROCIN CALCIUM 2 % EX CREA: 1.0000 "application " | TOPICAL_CREAM | Freq: Two times a day (BID) | CUTANEOUS | 0 refills | Status: DC

## 2017-11-13 MED ORDER — DAPAGLIFLOZIN PRO-METFORMIN ER 5-1000 MG PO TB24: 1.0000 | ORAL_TABLET | Freq: Two times a day (BID) | ORAL | 5 refills | Status: DC

## 2017-11-13 MED ORDER — DULAGLUTIDE 1.5 MG/0.5ML ~~LOC~~ SOAJ: 1.5000 mg | SUBCUTANEOUS | 6 refills | Status: DC

## 2017-11-13 NOTE — Progress Notes (Signed)
poct      Patient: Katie Buckley Female    DOB: 1950-11-24   67 y.o.   MRN: [id number] Visit Date: 11/13/2017  Today's Provider: Lelon Huh, MD   Chief Complaint  Patient presents with  . Wound Check   Subjective:    HPI  Wound: Patient comes in today complaining of a sore on the lower left leg. She first noticed the sore 4 days ago. Initially the sore resembled a water blister. The sore has drained since then and looks more like an open sore. She has been cleaning the wound with peroxide and polysporin. Associated symptoms includes redness and pain of the left lower leg.  She has been applying neosporin and cleaning with peroxide.  She is also due for follow up diabetes. Since last visit we changed from [place] to [place] but she states she is not taking it and there is no record of pharmacy dispensing [place]. She had no known adverse effects from Invokamet but was concerned about amputation risk. She is taking Trulicity every week consistently and tolerating well.      Wt Readings from Last 3 Encounters:  11/13/17 148 lb (67.1 kg)  10/03/17 155 lb (70.3 kg)  09/22/17 155 lb (70.3 kg)    Allergies  Allergen Reactions  . Biaxin  [Clarithromycin]     GI upset, and bad taste in mouth.     Current Outpatient Medications:  .  amitriptyline (ELAVIL) 10 MG tablet, TAKE UP TO 3 TABLETS BY MOUTH EVERY DAY AT BEDTIME**NEEDS OFFICE VISIT**, Disp: 60 tablet, Rfl: 0 .  atorvastatin (LIPITOR) 80 MG tablet, TAKE 1 TABLET BY MOUTH EVERY DAY, Disp: 90 tablet, Rfl: 1 .  buPROPion (WELLBUTRIN SR) 150 MG 12 hr tablet, Take 150 mg by mouth daily with breakfast., Disp: , Rfl: 0 .  butalbital-acetaminophen-caffeine (FIORICET, ESGIC) 50-325-40 MG tablet, TAKE 1 TABLET BY MOUTH EVERY 6 HOURS AS NEEDED, Disp: 30 tablet, Rfl: 5 .  Dapagliflozin-Metformin HCl ER (XIGDUO XR) 10-998 MG TB24, Take 1 tablet by mouth 2 (two) times daily.,(PATIENT NOT TAKING) Disp: 60 tablet, Rfl: 2 .  Dulaglutide  (TRULICITY) 1.5 XB/2.8UX SOPN, Inject 1.5 mg once a week into the skin., Disp: 4 pen, Rfl: 6 .  esomeprazole (NEXIUM) 40 MG capsule, TAKE 1 CAPSULE (40 MG TOTAL) BY MOUTH DAILY., Disp: 90 capsule, Rfl: 0 .  ezetimibe (ZETIA) 10 MG tablet, TAKE 1 TABLET BY MOUTH EVERY DAY, Disp: 30 tablet, Rfl: 0 .  fluticasone (FLONASE) 50 MCG/ACT nasal spray, Place 2 sprays into both nostrils daily., Disp: 16 g, Rfl: 0 .  gabapentin (NEURONTIN) 300 MG capsule, TAKE 1-2 CAPSULES BY MOUTH 2 TIMES A DAY, Disp: 360 capsule, Rfl: 3 .  glipiZIDE (GLUCOTROL) 10 MG tablet, TAKE 1 TABLET BY MOUTH EVERY DAY, Disp: 90 tablet, Rfl: 0 .  Lancet Devices MISC, , Disp: , Rfl:  .  meloxicam (MOBIC) 15 MG tablet, Take 1 tablet (15 mg total) by mouth daily. (Patient not taking: Reported on 06/22/2017), Disp: 30 tablet, Rfl: 0 .  metoprolol-hydrochlorothiazide (LOPRESSOR HCT) 100-25 MG tablet, TAKE 1/2 TABLET BY MOUTH EVERY DAY, Disp: 15 tablet, Rfl: 2 .  montelukast (SINGULAIR) 10 MG tablet, TAKE 1 TABLET BY MOUTH EVERY DAY, Disp: 30 tablet, Rfl: 5 .  Olopatadine HCl (PATADAY) 0.2 % SOLN, Apply to eye., Disp: , Rfl:  .  ondansetron (ZOFRAN ODT) 4 MG disintegrating tablet, Take 1 tablet (4 mg total) by mouth every 8 (eight) hours as needed for nausea or vomiting.,  Disp: 20 tablet, Rfl: 0 .  ondansetron (ZOFRAN) 4 MG tablet, Take 1 tablet (4 mg total) by mouth every 8 (eight) hours as needed for nausea or vomiting., Disp: 20 tablet, Rfl: 0 .  sertraline (ZOLOFT) 50 MG tablet, Take 1 tablet (50 mg total) by mouth daily., Disp: 90 tablet, Rfl: 3 .  valACYclovir (VALTREX) 1000 MG tablet, Take 1 tablet daily for 5 days as needed (Patient not taking: Reported on 06/22/2017), Disp: 30 tablet, Rfl: 3 .  valsartan-hydrochlorothiazide (DIOVAN-HCT) 320-12.5 MG tablet, TAKE 1 TABLET BY MOUTH DAILY, Disp: 90 tablet, Rfl: 2 .  zolpidem (AMBIEN) 5 MG tablet, Take 5 mg by mouth once. , Disp: , Rfl:   Current Facility-Administered Medications:  .   promethazine (PHENERGAN) injection 25 mg, 25 mg, Intramuscular, Once, Pollak, Adriana M, PA-C  Review of Systems  Musculoskeletal: Positive for myalgias.  Skin: Positive for color change and wound.    Social History   Tobacco Use  . Smoking status: Never Smoker  . Smokeless tobacco: Never Used  Substance Use Topics  . Alcohol use: No   Objective:   BP (!) 148/70 (BP Location: Left Arm, Patient Position: Sitting, Cuff Size: Normal)   Pulse 95   Temp 98.9 F (37.2 C) (Oral)   Resp 16   Wt 148 lb (67.1 kg)   SpO2 97% Comment: room air  BMI 28.90 kg/m  Vitals:   11/13/17 1630  BP: (!) 148/70  Pulse: 95  Resp: 16  Temp: 98.9 F (37.2 C)  TempSrc: Oral  SpO2: 97%  Weight: 148 lb (67.1 kg)     Physical Exam  General appearance: alert, well developed, well nourished, cooperative and in no distress Head: Normocephalic, without obvious abnormality, atraumatic Respiratory: Respirations even and unlabored, normal respiratory rate Extremities: Nickel sized abrasion left anterior lower leg. With about 8mm surrounding erythema. Scant amount of yellow sanguinous discharge. Several scattered papular lesions on arms and legs.   Results for orders placed or performed in visit on 11/13/17  POCT HgB A1C  Result Value Ref Range   Hemoglobin A1C 11.2 (A) 4.0 - 5.6 %   HbA1c, POC (prediabetic range)  5.7 - 6.4 %   HbA1c, POC (controlled diabetic range)  0.0 - 7.0 %   Est. average glucose Bld gHb Est-mCnc 275         Assessment & Plan:     1. Infected abrasion of left lower extremity, initial encounter  - mupirocin cream (BACTROBAN) 2 %; Apply 1 application topically 2 (two) times daily.  Dispense: 15 g; Refill: 0 - WOUND CULTURE  Clean only with warm water, do not use any antiseptics.  2. Uncontrolled type 2 diabetes mellitus with hyperglycemia ([place]) Improved on Trulicity, but she Xigduo prescribed earlier this year was never dispense. New prescription sent to pharmacy  today.  - POCT HgB A1C - Dapagliflozin-metFORMIN HCl ER (XIGDUO XR) 10-998 MG TB24; Take 1 tablet by mouth 2 (two) times daily.  Dispense: 60 tablet; Refill: [place], MD  [facility]

## 2017-11-14 ENCOUNTER — Ambulatory Visit: Payer: BLUE CROSS/BLUE SHIELD

## 2017-11-14 DIAGNOSIS — F411 Generalized anxiety disorder: Secondary | ICD-10-CM | POA: Diagnosis not present

## 2017-11-14 DIAGNOSIS — G4700 Insomnia, unspecified: Secondary | ICD-10-CM | POA: Diagnosis not present

## 2017-11-14 DIAGNOSIS — Z79899 Other long term (current) drug therapy: Secondary | ICD-10-CM | POA: Diagnosis not present

## 2017-11-14 DIAGNOSIS — F331 Major depressive disorder, recurrent, moderate: Secondary | ICD-10-CM | POA: Diagnosis not present

## 2017-11-16 ENCOUNTER — Telehealth: Payer: Self-pay | Admitting: Family Medicine

## 2017-11-16 LAB — AEROBIC CULTURE

## 2017-11-16 NOTE — Telephone Encounter (Signed)
Culture is negative. Continue using mupirocin cream until healed. Call if not much better over the weekend.

## 2017-11-16 NOTE — Telephone Encounter (Signed)
pt was in Tuesday and had a culture taken from her leg and has not heard back.  She is asking for the results.  Pt's call back is [phone]  Thank steri

## 2017-11-16 NOTE — Telephone Encounter (Signed)
Pt advised and agrees with plan. 

## 2017-11-16 NOTE — Telephone Encounter (Signed)
Please advise results? 

## 2017-11-26 ENCOUNTER — Other Ambulatory Visit: Payer: Self-pay | Admitting: Family Medicine

## 2017-11-26 DIAGNOSIS — E1140 Type 2 diabetes mellitus with diabetic neuropathy, unspecified: Secondary | ICD-10-CM

## 2017-11-26 DIAGNOSIS — IMO0002 Reserved for concepts with insufficient information to code with codable children: Secondary | ICD-10-CM

## 2017-11-26 DIAGNOSIS — E1165 Type 2 diabetes mellitus with hyperglycemia: Principal | ICD-10-CM

## 2017-12-17 ENCOUNTER — Other Ambulatory Visit: Payer: Self-pay | Admitting: Family Medicine

## 2017-12-17 DIAGNOSIS — IMO0002 Reserved for concepts with insufficient information to code with codable children: Secondary | ICD-10-CM

## 2017-12-17 DIAGNOSIS — E1140 Type 2 diabetes mellitus with diabetic neuropathy, unspecified: Secondary | ICD-10-CM

## 2017-12-17 DIAGNOSIS — E1165 Type 2 diabetes mellitus with hyperglycemia: Principal | ICD-10-CM

## 2017-12-24 ENCOUNTER — Encounter: Payer: Self-pay | Admitting: Family Medicine

## 2017-12-24 ENCOUNTER — Ambulatory Visit (INDEPENDENT_AMBULATORY_CARE_PROVIDER_SITE_OTHER): Payer: PPO | Admitting: Family Medicine

## 2017-12-24 VITALS — BP 120/70 | HR 81 | Temp 98.1°F | Resp 16 | Wt 136.2 lb

## 2017-12-24 DIAGNOSIS — R42 Dizziness and giddiness: Secondary | ICD-10-CM | POA: Diagnosis not present

## 2017-12-24 NOTE — Progress Notes (Signed)
  Subjective:     Patient ID: Katie Buckley, female   DOB: 15-May-1951, 67 y.o.   MRN: [id number] Chief Complaint  Patient presents with  . Fall    Patient comes in office today with complaints of being off balance for the past two weeks. Today patient states that she was walking in grocery store, and bent down to pick up jug of water and fell over, patient states this is not the first incident she has fallen and states that when she does fall it is always on her left side. Patient reports that she has bruied her toes and left knee, patient also complains of pain in her right ear.    HPI States she was in the grocery store today and felt dizzy. She bent down to pick up a case of water and was too weak to put it in the cart. She needed assistance to get up. Denies significant injury. Had normal brain MRI 11/09/17. Started on dapagliflozin/metformin in June. Continues to take gabapentin 600 mg. Twice daily and Fioricet occasionally.  Review of Systems     Objective:   Physical Exam  Constitutional: She appears well-developed and well-nourished. No distress.  HENT:  TM's dull without inflammation  Eyes: Pupils are equal, round, and reactive to light. EOM are normal.  Neck: Carotid bruit is not present.  Cardiovascular: Normal rate and regular rhythm.  Pulmonary/Chest: Breath sounds normal.  Neurological: She is alert. Coordination ( Romberg negative, Finger to nose WNL, heel to toe requires support for balance) normal.       Assessment:    1. Dizziness; SBP lower; will have her hold metoprolol/HCTZ - Renal function panel    Plan:    Further f/u pending lab results and in one week with her primary M.D., Dr. Caryn Section.

## 2017-12-24 NOTE — Patient Instructions (Signed)
Hold metoprolol/HCTZ. Get the labs tomorrow. We will call you with the results. We will schedule a follow up with Dr. Caryn Section in one week.

## 2017-12-26 DIAGNOSIS — R42 Dizziness and giddiness: Secondary | ICD-10-CM | POA: Diagnosis not present

## 2017-12-27 ENCOUNTER — Telehealth: Payer: Self-pay

## 2017-12-27 LAB — RENAL FUNCTION PANEL
ALBUMIN: 4.4 g/dL (ref 3.6–4.8)
BUN/Creatinine Ratio: 28 (ref 12–28)
BUN: 21 mg/dL (ref 8–27)
CHLORIDE: 92 mmol/L — AB (ref 96–106)
CO2: 21 mmol/L (ref 20–29)
Calcium: 9.5 mg/dL (ref 8.7–10.3)
Creatinine, Ser: 0.74 mg/dL (ref 0.57–1.00)
GFR calc Af Amer: 98 mL/min/{1.73_m2} (ref >59)
GFR calc non Af Amer: 85 mL/min/{1.73_m2} (ref >59)
GLUCOSE: 427 mg/dL — AB (ref 65–99)
PHOSPHORUS: 4.4 mg/dL (ref 2.5–4.5)
POTASSIUM: 4 mmol/L (ref 3.5–5.2)
Sodium: 134 mmol/L (ref 134–144)

## 2017-12-27 NOTE — Telephone Encounter (Signed)
Patient was notified of results. Expressed understanding.  

## 2017-12-27 NOTE — Telephone Encounter (Signed)
[id number] 

## 2017-12-27 NOTE — Telephone Encounter (Signed)
-----   Message from Carmon Ginsberg, [place] sent at 12/27/2017  9:01 AM EDT ----- Sugar is very high otherwise labs are ok. Do f/u with Dr. Caryn Section as discussed.

## 2018-01-01 NOTE — Progress Notes (Signed)
Patient: Katie Buckley Female    DOB: [birth year]   67 y.o.   MRN: [id number] Visit Date: 01/02/2018  Today's Provider: Lelon Huh, MD   Chief Complaint  Patient presents with  . Dizziness    follow up    Subjective:    HPI  Dizziness: From 12/24/2017-seen by Mariel Sleet. Metoprolol/HCTZ placed on hold. Labs checked-showing high blood sugar at 427. Patient was advised to follow up with pcp in 1 week. Patient states the dizziness is slightly improved. She reports she has fallen twice since the dizzy episodes originally started approximately 2 months ago, but none since stopping metoprolol-hctz. However she still feels very off balance and wobbly when walking. She did have MRI ordered by Dr. Pryor Ochoa in May due to hearing loss, which was unremarkable.   She does admit to frequently missing diabetes medications, and states she misses Trulicity doses 03% of the time.   Results for orders placed or performed in visit on 12/24/17  Renal function panel  Result Value Ref Range   Glucose 427 (H) 65 - 99 mg/dL   BUN 21 8 - 27 mg/dL   Creatinine, Ser 0.74 0.57 - 1.00 mg/dL   GFR calc non Af Amer 85 >59 mL/min/1.73   GFR calc Af Amer 98 >59 mL/min/1.73   BUN/Creatinine Ratio 28 12 - 28   Sodium 134 134 - 144 mmol/L   Potassium 4.0 3.5 - 5.2 mmol/L   Chloride 92 (L) 96 - 106 mmol/L   CO2 21 20 - 29 mmol/L   Calcium 9.5 8.7 - 10.3 mg/dL   Phosphorus 4.4 2.5 - 4.5 mg/dL   Albumin 4.4 3.6 - 4.8 g/dL   Lab Results  Component Value Date   HGBA1C 11.2 (A) 11/13/2017      Allergies  Allergen Reactions  . Biaxin  [Clarithromycin]     GI upset, and bad taste in mouth.     Current Outpatient Medications:  .  atorvastatin (LIPITOR) 80 MG tablet, TAKE 1 TABLET BY MOUTH EVERY DAY, Disp: 90 tablet, Rfl: 1 .  buPROPion (WELLBUTRIN SR) 150 MG 12 hr tablet, Take 150 mg by mouth daily with breakfast., Disp: , Rfl: 0 .  butalbital-acetaminophen-caffeine (FIORICET, ESGIC) 50-325-40 MG  tablet, TAKE 1 TABLET BY MOUTH EVERY 6 HOURS AS NEEDED, Disp: 30 tablet, Rfl: 0 .  cyclobenzaprine (FLEXERIL) 5 MG tablet, cyclobenzaprine 5 mg tablet  TAKE 1 TABLET BY MOUTH 3 TIMES DAILY, Disp: , Rfl:  .  Dapagliflozin-metFORMIN HCl ER (XIGDUO XR) 10-998 MG TB24, Take 1 tablet by mouth 2 (two) times daily., Disp: 60 tablet, Rfl: 5 .  Dulaglutide (TRULICITY) 1.5 ES/9.2ZR SOPN, Inject 1.5 mg into the skin once a week., Disp: 4 pen, Rfl: 6 .  esomeprazole (NEXIUM) 40 MG capsule, TAKE 1 CAPSULE (40 MG TOTAL) BY MOUTH DAILY., Disp: 90 capsule, Rfl: 0 .  ezetimibe (ZETIA) 10 MG tablet, TAKE 1 TABLET BY MOUTH EVERY DAY, Disp: 30 tablet, Rfl: 0 .  fluticasone (FLONASE) 50 MCG/ACT nasal spray, Place 2 sprays into both nostrils daily., Disp: 16 g, Rfl: 0 .  gabapentin (NEURONTIN) 300 MG capsule, TAKE 1-2 CAPSULES BY MOUTH 2 TIMES A DAY, Disp: 120 capsule, Rfl: 1 .  glipiZIDE (GLUCOTROL) 10 MG tablet, TAKE 1 TABLET BY MOUTH EVERY DAY, Disp: 90 tablet, Rfl: 1 .  hydrOXYzine (ATARAX/VISTARIL) 50 MG tablet, , Disp: , Rfl:  .  Lancet Devices MISC, , Disp: , Rfl:  .  meloxicam (MOBIC) 15  MG tablet, Take 1 tablet (15 mg total) by mouth daily., Disp: 30 tablet, Rfl: 0 .  montelukast (SINGULAIR) 10 MG tablet, TAKE 1 TABLET BY MOUTH EVERY DAY, Disp: 30 tablet, Rfl: 2 .  ondansetron (ZOFRAN) 4 MG tablet, Take 1 tablet (4 mg total) by mouth every 8 (eight) hours as needed for nausea or vomiting., Disp: 20 tablet, Rfl: 0 .  sertraline (ZOLOFT) 50 MG tablet, Take 1 tablet (50 mg total) by mouth daily., Disp: 90 tablet, Rfl: 3 .  valACYclovir (VALTREX) 1000 MG tablet, Take 1 tablet daily for 5 days as needed, Disp: 30 tablet, Rfl: 3 .  valsartan-hydrochlorothiazide (DIOVAN-HCT) 320-12.5 MG tablet, Take 1 tablet by mouth daily., Disp: , Rfl:  .  zolpidem (AMBIEN) 5 MG tablet, Take 5 mg by mouth once. , Disp: , Rfl:  .  metoprolol-hydrochlorothiazide (LOPRESSOR HCT) 100-25 MG tablet, TAKE 1/2 TABLET BY MOUTH EVERY DAY  (Patient not taking: Reported on 01/02/2018), Disp: 45 tablet, Rfl: 4  Current Facility-Administered Medications:  .  promethazine (PHENERGAN) injection 25 mg, 25 mg, Intramuscular, Once, Pollak, Adriana M, PA-C  Review of Systems  Constitutional: Negative for appetite change, chills, fatigue and fever.  Respiratory: Negative for chest tightness and shortness of breath.   Cardiovascular: Negative for chest pain and palpitations.  Gastrointestinal: Negative for abdominal pain, nausea and vomiting.  Neurological: Negative for dizziness and weakness.    Social History   Tobacco Use  . Smoking status: Never Smoker  . Smokeless tobacco: Never Used  Substance Use Topics  . Alcohol use: No   Objective:   BP 128/66 (BP Location: Left Arm, Patient Position: Sitting, Cuff Size: Normal)   Pulse (!) 109   Temp 98.6 F (37 C) (Oral)   Resp 14   Wt 145 lb 6.4 oz (66 kg)   SpO2 95%   BMI 28.40 kg/m  Vitals:   01/02/18 1554  BP: 128/66  Pulse: (!) 109  Resp: 14  Temp: 98.6 F (37 C)  TempSrc: Oral  SpO2: 95%  Weight: 145 lb 6.4 oz (66 kg)     Physical Exam   General Appearance:    Alert, cooperative, no distress  Eyes:    PERRL, conjunctiva/corneas clear, EOM's intact       Lungs:     Clear to auscultation bilaterally, respirations unlabored  Heart:    Regular rate and rhythm  Neurologic:   Awake, alert, oriented x 3. Normal finger to nose. DTRs +2 and symmetric. No s/s deficits. CN II-XII GI. Unable to perform tandem gait or heel to shin.           Assessment & Plan:     1. Ataxia  - Ambulatory referral to Neurology  2. Dizziness Multifactorial. Likely some component of orthostasis which has improved since stopping metoprolol-hctz, however she still has significant trouble with tandem gait and just feel off balance even when sitting and standing.   3. Uncontrolled type 2 diabetes mellitus with diabetic neuropathy, without long-term current use of insulin  ([place]) Reinforced importance of taking all of her diabetes medications consistently and to set reminders to take her medications on time.   4. Essential hypertension Well controlled, but tachycardic since stopping metoprolol-hctz. Will resume lower dose of 25mg  metoprolol ER a day and reduce valsartan-hctz to 1/2 of 320--25mg  tablet.   5. Tachycardia Resume low dose betablocker as above.   Return in about 1 month (around 01/30/2018).        Lelon Huh, MD  [facility]

## 2018-01-02 ENCOUNTER — Ambulatory Visit (INDEPENDENT_AMBULATORY_CARE_PROVIDER_SITE_OTHER): Payer: PPO | Admitting: Family Medicine

## 2018-01-02 ENCOUNTER — Encounter: Payer: Self-pay | Admitting: Family Medicine

## 2018-01-02 VITALS — BP 128/66 | HR 109 | Temp 98.6°F | Resp 14 | Wt 145.4 lb

## 2018-01-02 DIAGNOSIS — R42 Dizziness and giddiness: Secondary | ICD-10-CM

## 2018-01-02 DIAGNOSIS — E1140 Type 2 diabetes mellitus with diabetic neuropathy, unspecified: Secondary | ICD-10-CM

## 2018-01-02 DIAGNOSIS — I10 Essential (primary) hypertension: Secondary | ICD-10-CM

## 2018-01-02 DIAGNOSIS — R000 Tachycardia, unspecified: Secondary | ICD-10-CM

## 2018-01-02 DIAGNOSIS — E1165 Type 2 diabetes mellitus with hyperglycemia: Secondary | ICD-10-CM

## 2018-01-02 DIAGNOSIS — IMO0002 Reserved for concepts with insufficient information to code with codable children: Secondary | ICD-10-CM

## 2018-01-02 DIAGNOSIS — R270 Ataxia, unspecified: Secondary | ICD-10-CM | POA: Diagnosis not present

## 2018-01-02 MED ORDER — VALSARTAN-HYDROCHLOROTHIAZIDE 320-12.5 MG PO TABS: 0.5000 | ORAL_TABLET | Freq: Every day | ORAL | 0 refills | Status: DC

## 2018-01-02 MED ORDER — METOPROLOL SUCCINATE ER 25 MG PO TB24: 25.0000 mg | ORAL_TABLET | Freq: Every day | ORAL | 1 refills | Status: DC

## 2018-01-02 MED ORDER — DULAGLUTIDE 1.5 MG/0.5ML ~~LOC~~ SOAJ: 1.5000 mg | SUBCUTANEOUS | 6 refills | Status: DC

## 2018-01-02 NOTE — Patient Instructions (Signed)
   Start taking low dose of metoprolol 25mg  once a day   Start cutting valsartan tablets in half, and take one-half tablet every day   Take Trulicity injections every single week.

## 2018-01-08 DIAGNOSIS — R270 Ataxia, unspecified: Secondary | ICD-10-CM | POA: Diagnosis not present

## 2018-01-15 ENCOUNTER — Other Ambulatory Visit: Payer: Self-pay | Admitting: Family Medicine

## 2018-01-15 DIAGNOSIS — E1140 Type 2 diabetes mellitus with diabetic neuropathy, unspecified: Secondary | ICD-10-CM

## 2018-01-15 DIAGNOSIS — E1165 Type 2 diabetes mellitus with hyperglycemia: Principal | ICD-10-CM

## 2018-01-15 DIAGNOSIS — IMO0002 Reserved for concepts with insufficient information to code with codable children: Secondary | ICD-10-CM

## 2018-01-21 NOTE — Progress Notes (Deleted)
Patient: Katie Buckley Female    DOB: 1951-05-19   67 y.o.   MRN: [id number] Visit Date: 01/21/2018  Today's Provider: Lelon Huh, MD   No chief complaint on file.  Subjective:    HPI     Allergies  Allergen Reactions  . Biaxin  [Clarithromycin]     GI upset, and bad taste in mouth.     Current Outpatient Medications:  .  atorvastatin (LIPITOR) 80 MG tablet, TAKE 1 TABLET BY MOUTH EVERY DAY, Disp: 90 tablet, Rfl: 1 .  buPROPion (WELLBUTRIN SR) 150 MG 12 hr tablet, Take 150 mg by mouth daily with breakfast., Disp: , Rfl: 0 .  butalbital-acetaminophen-caffeine (FIORICET, ESGIC) 50-325-40 MG tablet, TAKE 1 TABLET BY MOUTH EVERY 6 HOURS AS NEEDED, Disp: 30 tablet, Rfl: 0 .  cyclobenzaprine (FLEXERIL) 5 MG tablet, cyclobenzaprine 5 mg tablet  TAKE 1 TABLET BY MOUTH 3 TIMES DAILY, Disp: , Rfl:  .  Dapagliflozin-metFORMIN HCl ER (XIGDUO XR) 10-998 MG TB24, Take 1 tablet by mouth 2 (two) times daily., Disp: 60 tablet, Rfl: 5 .  Dulaglutide (TRULICITY) 1.5 TT/0.1XB SOPN, Inject 1.5 mg into the skin once a week., Disp: 4 pen, Rfl: 6 .  esomeprazole (NEXIUM) 40 MG capsule, TAKE 1 CAPSULE (40 MG TOTAL) BY MOUTH DAILY., Disp: 90 capsule, Rfl: 0 .  ezetimibe (ZETIA) 10 MG tablet, TAKE 1 TABLET BY MOUTH EVERY DAY, Disp: 30 tablet, Rfl: 0 .  fluticasone (FLONASE) 50 MCG/ACT nasal spray, Place 2 sprays into both nostrils daily., Disp: 16 g, Rfl: 0 .  gabapentin (NEURONTIN) 300 MG capsule, TAKE 1-2 CAPSULES BY MOUTH 2 TIMES A DAY, Disp: 360 capsule, Rfl: 1 .  glipiZIDE (GLUCOTROL) 10 MG tablet, TAKE 1 TABLET BY MOUTH EVERY DAY, Disp: 90 tablet, Rfl: 1 .  hydrOXYzine (ATARAX/VISTARIL) 50 MG tablet, , Disp: , Rfl:  .  Lancet Devices MISC, , Disp: , Rfl:  .  meloxicam (MOBIC) 15 MG tablet, Take 1 tablet (15 mg total) by mouth daily., Disp: 30 tablet, Rfl: 0 .  metoprolol succinate (TOPROL-XL) 25 MG 24 hr tablet, Take 1 tablet (25 mg total) by mouth daily., Disp: 30 tablet, Rfl: 1 .   montelukast (SINGULAIR) 10 MG tablet, TAKE 1 TABLET BY MOUTH EVERY DAY, Disp: 30 tablet, Rfl: 2 .  ondansetron (ZOFRAN) 4 MG tablet, Take 1 tablet (4 mg total) by mouth every 8 (eight) hours as needed for nausea or vomiting., Disp: 20 tablet, Rfl: 0 .  sertraline (ZOLOFT) 50 MG tablet, Take 1 tablet (50 mg total) by mouth daily., Disp: 90 tablet, Rfl: 3 .  valACYclovir (VALTREX) 1000 MG tablet, Take 1 tablet daily for 5 days as needed, Disp: 30 tablet, Rfl: 3 .  valsartan-hydrochlorothiazide (DIOVAN-HCT) 320-12.5 MG tablet, Take 0.5 tablets by mouth daily., Disp: 3 tablet, Rfl: 0 .  zolpidem (AMBIEN) 5 MG tablet, Take 5 mg by mouth once. , Disp: , Rfl:   Current Facility-Administered Medications:  .  promethazine (PHENERGAN) injection 25 mg, 25 mg, Intramuscular, Once, Pollak, Adriana M, PA-C  Review of Systems  Constitutional: Negative for appetite change, chills, fatigue and fever.  Respiratory: Negative for chest tightness and shortness of breath.   Cardiovascular: Negative for chest pain and palpitations.  Gastrointestinal: Negative for abdominal pain, nausea and vomiting.  Neurological: Negative for dizziness and weakness.    Social History   Tobacco Use  . Smoking status: Never Smoker  . Smokeless tobacco: Never Used  Substance Use Topics  .  Alcohol use: No   Objective:   There were no vitals taken for this visit. There were no vitals filed for this visit.   Physical Exam      Assessment & Plan:           Lelon Huh, MD  [facility]

## 2018-01-23 ENCOUNTER — Ambulatory Visit: Payer: Self-pay | Admitting: Family Medicine

## 2018-01-25 ENCOUNTER — Other Ambulatory Visit: Payer: Self-pay | Admitting: Family Medicine

## 2018-01-25 DIAGNOSIS — I10 Essential (primary) hypertension: Secondary | ICD-10-CM

## 2018-01-31 ENCOUNTER — Other Ambulatory Visit: Payer: Self-pay | Admitting: Family Medicine

## 2018-01-31 DIAGNOSIS — M778 Other enthesopathies, not elsewhere classified: Secondary | ICD-10-CM | POA: Insufficient documentation

## 2018-02-04 ENCOUNTER — Other Ambulatory Visit (HOSPITAL_COMMUNITY)
Admission: RE | Admit: 2018-02-04 | Discharge: 2018-02-04 | Disposition: A | Discharge status: Home / Self Care | Payer: PPO | Source: Ambulatory Visit | Attending: Obstetrics and Gynecology | Admitting: Obstetrics and Gynecology

## 2018-02-04 ENCOUNTER — Encounter: Payer: Self-pay | Admitting: Obstetrics and Gynecology

## 2018-02-04 ENCOUNTER — Ambulatory Visit (INDEPENDENT_AMBULATORY_CARE_PROVIDER_SITE_OTHER): Payer: PPO | Admitting: Obstetrics and Gynecology

## 2018-02-04 VITALS — BP 150/70 | HR 83 | Ht 60.0 in | Wt 148.0 lb

## 2018-02-04 DIAGNOSIS — Z1231 Encounter for screening mammogram for malignant neoplasm of breast: Secondary | ICD-10-CM | POA: Diagnosis not present

## 2018-02-04 DIAGNOSIS — Z124 Encounter for screening for malignant neoplasm of cervix: Secondary | ICD-10-CM

## 2018-02-04 DIAGNOSIS — F411 Generalized anxiety disorder: Secondary | ICD-10-CM | POA: Diagnosis not present

## 2018-02-04 DIAGNOSIS — B356 Tinea cruris: Secondary | ICD-10-CM | POA: Diagnosis not present

## 2018-02-04 DIAGNOSIS — G4700 Insomnia, unspecified: Secondary | ICD-10-CM | POA: Diagnosis not present

## 2018-02-04 DIAGNOSIS — Z1239 Encounter for other screening for malignant neoplasm of breast: Secondary | ICD-10-CM

## 2018-02-04 DIAGNOSIS — R6882 Decreased libido: Secondary | ICD-10-CM | POA: Diagnosis not present

## 2018-02-04 DIAGNOSIS — N9089 Other specified noninflammatory disorders of vulva and perineum: Secondary | ICD-10-CM | POA: Diagnosis not present

## 2018-02-04 DIAGNOSIS — F331 Major depressive disorder, recurrent, moderate: Secondary | ICD-10-CM | POA: Diagnosis not present

## 2018-02-04 NOTE — Patient Instructions (Signed)
I value your feedback and entrusting us with your care. If you get a [place] patient survey, I would appreciate you taking the time to let us know about your experience today. Thank you! 

## 2018-02-04 NOTE — Progress Notes (Signed)
[name], MD   Chief Complaint  Patient presents with  . Follow-up    Havent had a pap done in a couple of yrs per pt, abt a wk ago pt notice a big knot on vag area, it was painful at first it has eased up now per pt. pt squeezed the knot and got something out of it per pt    HPI:      [name] is a 67 y.o. G2P1 who LMP was No LMP recorded. Patient is postmenopausal., presents today for eval of RT vulvar lesion since last wk. Area was very painful and tender, causing throbbing pain. Pt treating with hydrogen peroxide and was able to express purulent d/c. Pain improving now. Never had sx before. No fevers. Pt has itching/burning in inguinal area bilat. Has been treating with jock itch spray with sx improvement.  Pt also tearful about decreased libido. Under a lot of stress, also taking beta blockers. Pt hasn't been sex active in about a yr, but denies vaginal dryness. No vag bleeding/spotting. No vasomotor sx. Did HRT in her 85s.  Last pap: 07/07/14 negative pap/neg HPV DNA Last mammo: 05/14/15  Past Medical History:  Diagnosis Date  . Anxiety   . Depression   . Diabetes mellitus without complication ([place])   . Frequent sinus infections   . GERD (gastroesophageal reflux disease) 04/21/2015   History of Barrett's esophagus   . Heart murmur   . Seasonal allergies   . Skin cancer    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    Past Surgical History:  Procedure Laterality Date  . AUGMENTATION MAMMAPLASTY Bilateral    breast implants  . Bone Spur  2007   foot  . Carpal Tunnel Symdrome  2008   as repeated in 2009  . CHOLECYSTECTOMY  2007  . COLONOSCOPY WITH PROPOFOL N/A 11/17/2015   Procedure: COLONOSCOPY WITH PROPOFOL;  Surgeon: Manya Silvas, MD;  Location: [facility] ENDOSCOPY;  Service: Endoscopy;  Laterality: N/A;  . ESOPHAGOGASTRODUODENOSCOPY (EGD) WITH PROPOFOL N/A 11/17/2015   Procedure: ESOPHAGOGASTRODUODENOSCOPY (EGD) WITH PROPOFOL;  Surgeon: Manya Silvas, MD;  Location: [facility] ENDOSCOPY;  Service: Endoscopy;  Laterality: N/A;  . MANDIBLE SURGERY    . NASAL SINUS SURGERY  03/2013   Multile surgery Dr. Carlis Abbott. DX eosinophilic sinusitis [phone]  . Release of Trigger Finger Right 10/2010   Dr. Tamala Julian  . TONSILLECTOMY  1960    Family History  Problem Relation Age of Onset  . Diabetes Father   . Lung cancer Mother   . Breast cancer Maternal Aunt   . Irritable bowel syndrome Sister   . Colon cancer Brother     Social History   Socioeconomic History  . Marital status: Married    Spouse name: Not on file  . Number of children: Not on file  . Years of education: Not on file  . Highest education level: Not on file  Occupational History  . Not on file  Social Needs  . Financial resource strain: Not on file  . Food insecurity:    Worry: Not on file    Inability: Not on file  . Transportation needs:    Medical: Not on file    Non-medical: Not on file  Tobacco Use  . Smoking status: Never Smoker  . Smokeless tobacco: Never Used  Substance and Sexual Activity  . Alcohol use: No  . Drug use: No  . Sexual activity: Not Currently  Birth control/protection: Post-menopausal  Lifestyle  . Physical activity:    Days per week: Not on file    Minutes per session: Not on file  . Stress: Not on file  Relationships  . Social connections:    Talks on phone: Not on file    Gets together: Not on file    Attends religious service: Not on file    Active member of club or organization: Not on file    Attends meetings of clubs or organizations: Not on file    Relationship status: Not on file  . Intimate partner violence:    Fear of current or ex partner: Not on file    Emotionally abused: Not on file    Physically abused: Not on file    Forced sexual activity: Not on file  Other Topics Concern  . Not on file  Social History Narrative  . Not on file    Outpatient Medications Prior to Visit  Medication Sig Dispense Refill  .  Biotin 10 MG CAPS Take by mouth.    Marland Kitchen buPROPion (WELLBUTRIN SR) 150 MG 12 hr tablet Take 150 mg by mouth daily with breakfast.  0  . butalbital-acetaminophen-caffeine (FIORICET, ESGIC) 50-325-40 MG tablet TAKE 1 TABLET BY MOUTH EVERY 6 HOURS AS NEEDED 30 tablet 3  . cyclobenzaprine (FLEXERIL) 5 MG tablet cyclobenzaprine 5 mg tablet  TAKE 1 TABLET BY MOUTH 3 TIMES DAILY    . Dapagliflozin-metFORMIN HCl ER (XIGDUO XR) 10-998 MG TB24 Take 1 tablet by mouth 2 (two) times daily. 60 tablet 5  . Dulaglutide (TRULICITY) 1.5 HW/2.9HB SOPN Inject 1.5 mg into the skin once a week. 4 pen 6  . esomeprazole (NEXIUM) 40 MG capsule TAKE 1 CAPSULE (40 MG TOTAL) BY MOUTH DAILY. 90 capsule 0  . ezetimibe (ZETIA) 10 MG tablet TAKE 1 TABLET BY MOUTH EVERY DAY 30 tablet 0  . fluconazole (DIFLUCAN) 150 MG tablet fluconazole 150 mg tablet    . fluticasone (FLONASE) 50 MCG/ACT nasal spray Place 2 sprays into both nostrils daily. 16 g 0  . gabapentin (NEURONTIN) 300 MG capsule TAKE 1-2 CAPSULES BY MOUTH 2 TIMES A DAY 360 capsule 1  . glipiZIDE (GLUCOTROL) 10 MG tablet TAKE 1 TABLET BY MOUTH EVERY DAY 90 tablet 1  . hydrOXYzine (ATARAX/VISTARIL) 50 MG tablet     . Lancet Devices MISC     . meloxicam (MOBIC) 15 MG tablet Take 1 tablet (15 mg total) by mouth daily. 30 tablet 0  . metoprolol succinate (TOPROL-XL) 25 MG 24 hr tablet TAKE 1 TABLET BY MOUTH EVERY DAY 30 tablet 3  . metoprolol-hydrochlorothiazide (LOPRESSOR HCT) 100-25 MG tablet metoprolol tartrate 100 mg-hydrochlorothiazide 25 mg tablet    . montelukast (SINGULAIR) 10 MG tablet TAKE 1 TABLET BY MOUTH EVERY DAY 30 tablet 2  . Olopatadine HCl (PATADAY) 0.2 % SOLN Pataday 0.2 % eye drops    . ondansetron (ZOFRAN) 4 MG tablet Take 1 tablet (4 mg total) by mouth every 8 (eight) hours as needed for nausea or vomiting. 20 tablet 0  . sertraline (ZOLOFT) 100 MG tablet sertraline 100 mg tablet    . valACYclovir (VALTREX) 1000 MG tablet Take 1 tablet daily for 5 days as  needed 30 tablet 3  . valsartan-hydrochlorothiazide (DIOVAN-HCT) 320-12.5 MG tablet Take 0.5 tablets by mouth daily. 3 tablet 0  . zolpidem (AMBIEN) 10 MG tablet zolpidem 10 mg tablet    . atorvastatin (LIPITOR) 80 MG tablet TAKE 1 TABLET BY MOUTH EVERY DAY 90  tablet 1  . sertraline (ZOLOFT) 50 MG tablet Take 1 tablet (50 mg total) by mouth daily. 90 tablet 3  . zolpidem (AMBIEN) 5 MG tablet Take 5 mg by mouth once.      Facility-Administered Medications Prior to Visit  Medication Dose Route Frequency Provider Last Rate Last Dose  . promethazine (PHENERGAN) injection 25 mg  25 mg Intramuscular Once Pollak, Adriana M, PA-C        ROS:  Review of Systems  Constitutional: Positive for fatigue. Negative for fever and unexpected weight change.  Respiratory: Negative for cough, shortness of breath and wheezing.   Cardiovascular: Negative for chest pain, palpitations and leg swelling.  Gastrointestinal: Negative for blood in stool, constipation, diarrhea, nausea and vomiting.  Endocrine: Negative for cold intolerance, heat intolerance and polyuria.  Genitourinary: Positive for genital sores. Negative for dyspareunia, dysuria, flank pain, frequency, hematuria, menstrual problem, pelvic pain, urgency, vaginal bleeding, vaginal discharge and vaginal pain.  Musculoskeletal: Negative for back pain, joint swelling and myalgias.  Skin: Negative for rash.  Neurological: Negative for dizziness, syncope, light-headedness, numbness and headaches.  Hematological: Negative for adenopathy.  Psychiatric/Behavioral: Positive for agitation and dysphoric mood. Negative for confusion, sleep disturbance and suicidal ideas. The patient is not nervous/anxious.     OBJECTIVE:   Vitals:  BP (!) 150/70   Pulse 83   Ht 5' (1.524 m)   Wt 148 lb (67.1 kg)   BMI 28.90 kg/m   Physical Exam  Constitutional: She is oriented to person, place, and time. Vital signs are normal. She appears well-developed.    Pulmonary/Chest: Effort normal.  Genitourinary: Vagina normal and uterus normal.    There is no rash, tenderness or lesion on the right labia. There is no rash, tenderness or lesion on the left labia. Uterus is not enlarged and not tender. Cervix exhibits no motion tenderness. Right adnexum displays no mass and no tenderness. Left adnexum displays no mass and no tenderness. No erythema or tenderness in the vagina. No vaginal discharge found.  Genitourinary Comments: RESOLVING SCABBED SMALL ABSCESS RT MONS; ERYTHEMATOUS SCALY PATCHES BILAT ING AREAS; MOD VAGINAL ATROPHY  Musculoskeletal: Normal range of motion.  Neurological: She is alert and oriented to person, place, and time.  Psychiatric: She has a normal mood and affect. Her behavior is normal. Thought content normal.  Vitals reviewed.   Assessment/Plan: Vulvar lesion - Resolving abscess. Reassurance. Warm compresses. F/u if sx do not fully resolve  Tinea cruris - Pt treating with appropriate medication. Keep area dry/cornstarch powder and cool hair dryer.   Cervical cancer screening - Plan: Cytology - PAP  Screening for breast cancer - Pt to sched mammom - Plan: MM DIGITAL SCREENING BILATERAL  Decreased libido - Discussed minimal tx options, and HRT not appopriate at her age, and may not be helpful. Also under stress and on beta blockers which affect sx     Return if symptoms worsen or fail to improve.  Cassandr Cederberg B. Arabella Revelle, PA-C 02/05/2018 9:32 AM

## 2018-02-05 ENCOUNTER — Encounter: Payer: Self-pay | Admitting: Obstetrics and Gynecology

## 2018-02-06 LAB — CYTOLOGY - PAP: Diagnosis: NEGATIVE

## 2018-02-12 ENCOUNTER — Telehealth: Payer: Self-pay

## 2018-02-12 MED ORDER — ERTUGLIFLOZIN-METFORMIN HCL 2.5-1000 MG PO TABS: 1.0000 | ORAL_TABLET | Freq: Two times a day (BID) | ORAL | 3 refills | Status: DC

## 2018-02-12 NOTE — Telephone Encounter (Signed)
Patient requesting a new prescription be called in to replace XIGDOU XR due to insurance not covering. [facility] [address]

## 2018-02-18 DIAGNOSIS — F331 Major depressive disorder, recurrent, moderate: Secondary | ICD-10-CM | POA: Diagnosis not present

## 2018-02-18 DIAGNOSIS — Z79899 Other long term (current) drug therapy: Secondary | ICD-10-CM | POA: Diagnosis not present

## 2018-02-18 DIAGNOSIS — G4700 Insomnia, unspecified: Secondary | ICD-10-CM | POA: Diagnosis not present

## 2018-02-18 DIAGNOSIS — F411 Generalized anxiety disorder: Secondary | ICD-10-CM | POA: Diagnosis not present

## 2018-02-27 DIAGNOSIS — F331 Major depressive disorder, recurrent, moderate: Secondary | ICD-10-CM | POA: Diagnosis not present

## 2018-02-27 DIAGNOSIS — F411 Generalized anxiety disorder: Secondary | ICD-10-CM | POA: Diagnosis not present

## 2018-02-28 ENCOUNTER — Telehealth: Payer: Self-pay | Admitting: Family Medicine

## 2018-03-08 MED ORDER — METFORMIN HCL ER 500 MG PO TB24: 500.0000 mg | ORAL_TABLET | Freq: Every day | ORAL | 3 refills | Status: DC

## 2018-03-08 MED ORDER — EMPAGLIFLOZIN 10 MG PO TABS: 10.0000 mg | ORAL_TABLET | Freq: Every day | ORAL | 2 refills | Status: DC

## 2018-03-08 NOTE — Telephone Encounter (Signed)
Please advise that her insurance won't pay for any refills of segluromet, so we are changed to two separate prescriptions to take its place, one for metformin and one for Jardiance. Have sent prescriptions to [facility] [address] [facility].

## 2018-03-08 NOTE — Telephone Encounter (Signed)
Patient was advised.  

## 2018-03-13 ENCOUNTER — Other Ambulatory Visit: Payer: Self-pay | Admitting: Family Medicine

## 2018-03-13 DIAGNOSIS — IMO0002 Reserved for concepts with insufficient information to code with codable children: Secondary | ICD-10-CM

## 2018-03-13 DIAGNOSIS — E1165 Type 2 diabetes mellitus with hyperglycemia: Principal | ICD-10-CM

## 2018-03-13 DIAGNOSIS — E1140 Type 2 diabetes mellitus with diabetic neuropathy, unspecified: Secondary | ICD-10-CM

## 2018-03-14 DIAGNOSIS — F411 Generalized anxiety disorder: Secondary | ICD-10-CM | POA: Diagnosis not present

## 2018-03-14 DIAGNOSIS — F331 Major depressive disorder, recurrent, moderate: Secondary | ICD-10-CM | POA: Diagnosis not present

## 2018-03-15 ENCOUNTER — Ambulatory Visit
Admission: RE | Admit: 2018-03-15 | Discharge: 2018-03-15 | Disposition: A | Discharge status: Home / Self Care | Payer: PPO | Source: Ambulatory Visit | Attending: Obstetrics and Gynecology | Admitting: Obstetrics and Gynecology

## 2018-03-15 DIAGNOSIS — Z1231 Encounter for screening mammogram for malignant neoplasm of breast: Secondary | ICD-10-CM | POA: Insufficient documentation

## 2018-03-15 DIAGNOSIS — Z1239 Encounter for other screening for malignant neoplasm of breast: Secondary | ICD-10-CM

## 2018-03-17 ENCOUNTER — Encounter: Payer: Self-pay | Admitting: Obstetrics and Gynecology

## 2018-03-26 ENCOUNTER — Other Ambulatory Visit: Payer: Self-pay | Admitting: Family Medicine

## 2018-03-29 ENCOUNTER — Encounter: Payer: Self-pay | Admitting: Family Medicine

## 2018-03-29 ENCOUNTER — Ambulatory Visit (INDEPENDENT_AMBULATORY_CARE_PROVIDER_SITE_OTHER): Payer: PPO | Admitting: Family Medicine

## 2018-03-29 VITALS — BP 128/64 | HR 88 | Temp 98.4°F | Resp 16 | Ht 60.0 in | Wt 150.0 lb

## 2018-03-29 DIAGNOSIS — Z23 Encounter for immunization: Secondary | ICD-10-CM | POA: Diagnosis not present

## 2018-03-29 DIAGNOSIS — J0110 Acute frontal sinusitis, unspecified: Secondary | ICD-10-CM

## 2018-03-29 MED ORDER — FLUTICASONE PROPIONATE 50 MCG/ACT NA SUSP: 2.0000 | Freq: Every day | NASAL | 2 refills | Status: DC

## 2018-03-29 MED ORDER — AMOXICILLIN 500 MG PO CAPS: 1000.0000 mg | ORAL_CAPSULE | Freq: Two times a day (BID) | ORAL | 0 refills | Status: AC

## 2018-03-29 NOTE — Progress Notes (Signed)
Patient: Katie Buckley Female    DOB: 11/14/1950   67 y.o.   MRN: [id number] Visit Date: 03/29/2018  Today's Provider: Lelon Huh, MD   Chief Complaint  Patient presents with  . Sinusitis   Subjective:    Sinusitis  This is a new problem. The current episode started 1 to 4 weeks ago (about 3 weeks). The problem is unchanged. There has been no fever. The pain is moderate. Associated symptoms include congestion, coughing, ear pain and sinus pressure. Pertinent negatives include no chills. Past treatments include oral decongestants and acetaminophen. The treatment provided mild relief.   Patient reports that her main complaint is her nose is congested and very sore.     Allergies  Allergen Reactions  . Biaxin  [Clarithromycin]     GI upset, and bad taste in mouth.  . Doxycycline Other (See Comments)     Current Outpatient Medications:  .  Biotin 10 MG CAPS, Take by mouth., Disp: , Rfl:  .  buPROPion (WELLBUTRIN SR) 150 MG 12 hr tablet, Take 150 mg by mouth daily with breakfast., Disp: , Rfl: 0 .  butalbital-acetaminophen-caffeine (FIORICET, ESGIC) 50-325-40 MG tablet, TAKE 1 TABLET BY MOUTH EVERY 6 HOURS AS NEEDED, Disp: 30 tablet, Rfl: 3 .  cyclobenzaprine (FLEXERIL) 5 MG tablet, cyclobenzaprine 5 mg tablet  TAKE 1 TABLET BY MOUTH 3 TIMES DAILY, Disp: , Rfl:  .  Dulaglutide (TRULICITY) 1.5 OM/3.5DH SOPN, Inject 1.5 mg into the skin once a week., Disp: 4 pen, Rfl: 6 .  esomeprazole (NEXIUM) 40 MG capsule, TAKE 1 CAPSULE (40 MG TOTAL) BY MOUTH DAILY., Disp: 90 capsule, Rfl: 0 .  ezetimibe (ZETIA) 10 MG tablet, TAKE 1 TABLET BY MOUTH EVERY DAY, Disp: 30 tablet, Rfl: 11 .  fluticasone (FLONASE) 50 MCG/ACT nasal spray, Place 2 sprays into both nostrils daily., Disp: 16 g, Rfl: 0 .  gabapentin (NEURONTIN) 300 MG capsule, TAKE 1-2 CAPSULES BY MOUTH 2 TIMES A DAY, Disp: 360 capsule, Rfl: 1 .  glipiZIDE (GLUCOTROL) 10 MG tablet, TAKE 1 TABLET BY MOUTH EVERY DAY, Disp: 90  tablet, Rfl: 0 .  hydrOXYzine (ATARAX/VISTARIL) 50 MG tablet, , Disp: , Rfl:  .  Lancet Devices MISC, , Disp: , Rfl:  .  meloxicam (MOBIC) 15 MG tablet, Take 1 tablet (15 mg total) by mouth daily., Disp: 30 tablet, Rfl: 0 .  metoprolol succinate (TOPROL-XL) 25 MG 24 hr tablet, TAKE 1 TABLET BY MOUTH EVERY DAY, Disp: 30 tablet, Rfl: 3 .  montelukast (SINGULAIR) 10 MG tablet, TAKE 1 TABLET BY MOUTH EVERY DAY, Disp: 30 tablet, Rfl: 2 .  Olopatadine HCl (PATADAY) 0.2 % SOLN, Pataday 0.2 % eye drops, Disp: , Rfl:  .  ondansetron (ZOFRAN) 4 MG tablet, Take 1 tablet (4 mg total) by mouth every 8 (eight) hours as needed for nausea or vomiting., Disp: 20 tablet, Rfl: 0 .  sertraline (ZOLOFT) 100 MG tablet, sertraline 100 mg tablet, Disp: , Rfl:  .  valACYclovir (VALTREX) 1000 MG tablet, Take 1 tablet daily for 5 days as needed, Disp: 30 tablet, Rfl: 3 .  valsartan-hydrochlorothiazide (DIOVAN-HCT) 320-12.5 MG tablet, Take 0.5 tablets by mouth daily., Disp: 3 tablet, Rfl: 0 .  zolpidem (AMBIEN) 10 MG tablet, zolpidem 10 mg tablet, Disp: , Rfl:  .  empagliflozin (JARDIANCE) 10 MG TABS tablet, Take 10 mg by mouth daily. (Patient not taking: Reported on 03/29/2018), Disp: 30 tablet, Rfl: 2 .  fluconazole (DIFLUCAN) 150 MG tablet, fluconazole 150  mg tablet, Disp: , Rfl:  .  metFORMIN (GLUCOPHAGE-XR) 500 MG 24 hr tablet, Take 1 tablet (500 mg total) by mouth daily with breakfast. (Patient not taking: Reported on 03/29/2018), Disp: 120 tablet, Rfl: 3 .  metoprolol-hydrochlorothiazide (LOPRESSOR HCT) 100-25 MG tablet, metoprolol tartrate 100 mg-hydrochlorothiazide 25 mg tablet, Disp: , Rfl:   Current Facility-Administered Medications:  .  promethazine (PHENERGAN) injection 25 mg, 25 mg, Intramuscular, Once, Pollak, Adriana M, PA-C  Review of Systems  Constitutional: Positive for fatigue. Negative for activity change, appetite change, chills and fever.  HENT: Positive for congestion, ear pain, postnasal drip and  sinus pressure.   Respiratory: Positive for cough.   Allergic/Immunologic: Positive for environmental allergies.    Social History   Tobacco Use  . Smoking status: Never Smoker  . Smokeless tobacco: Never Used  Substance Use Topics  . Alcohol use: No   Objective:   BP 128/64 (BP Location: Left Arm, Patient Position: Sitting, Cuff Size: Normal)   Pulse 88   Temp 98.4 F (36.9 C)   Resp 16   Ht 5' (1.524 m)   Wt 150 lb (68 kg)   SpO2 98%   BMI 29.29 kg/m  Vitals:   03/29/18 1108  BP: 128/64  Pulse: 88  Resp: 16  Temp: 98.4 F (36.9 C)  SpO2: 98%  Weight: 150 lb (68 kg)  Height: 5' (1.524 m)     Physical Exam  General Appearance:    Alert, cooperative, no distress  HENT:   bilateral TM normal without fluid or infection, neck without nodes, throat normal without erythema or exudate, right maxillar sinus tender, post nasal drip noted and nasal mucosa pale and congested right > L Nares red and inflamed on right.   Eyes:    PERRL, conjunctiva/corneas clear, EOM's intact       Lungs:     Clear to auscultation bilaterally, respirations unlabored  Heart:    Regular rate and rhythm  Neurologic:   Awake, alert, oriented x 3. No apparent focal neurological           defect.          Assessment & Plan:     1. Acute frontal sinusitis, recurrence not specified Call if symptoms change or if not rapidly improving.     - amoxicillin (AMOXIL) 500 MG capsule; Take 2 capsules (1,000 mg total) by mouth 2 (two) times daily for 10 days.  Dispense: 40 capsule; Refill: 0 - fluticasone (FLONASE) 50 MCG/ACT nasal spray; Place 2 sprays into both nostrils daily.  Dispense: 16 g; Refill: 2       Lelon Huh, MD  [facility]

## 2018-04-09 ENCOUNTER — Telehealth: Payer: Self-pay | Admitting: Family Medicine

## 2018-04-09 NOTE — Telephone Encounter (Signed)
I left a message asking the pt to call me at [phone] to schedule Welcome to Medicare visit with Dr. Caryn Section (due before 11/11/2018). VDM (DD)

## 2018-04-15 ENCOUNTER — Other Ambulatory Visit: Payer: Self-pay | Admitting: Family Medicine

## 2018-04-15 DIAGNOSIS — K219 Gastro-esophageal reflux disease without esophagitis: Secondary | ICD-10-CM

## 2018-04-19 ENCOUNTER — Ambulatory Visit (INDEPENDENT_AMBULATORY_CARE_PROVIDER_SITE_OTHER): Payer: PPO | Admitting: Family Medicine

## 2018-04-19 ENCOUNTER — Encounter: Payer: Self-pay | Admitting: Family Medicine

## 2018-04-19 VITALS — BP 128/74 | HR 80 | Temp 98.3°F | Resp 16 | Wt 148.0 lb

## 2018-04-19 DIAGNOSIS — L03818 Cellulitis of other sites: Secondary | ICD-10-CM

## 2018-04-19 DIAGNOSIS — L0390 Cellulitis, unspecified: Principal | ICD-10-CM

## 2018-04-19 DIAGNOSIS — B9562 Methicillin resistant Staphylococcus aureus infection as the cause of diseases classified elsewhere: Secondary | ICD-10-CM | POA: Insufficient documentation

## 2018-04-19 NOTE — Progress Notes (Signed)
Patient: Katie Buckley Female    DOB: March 17, 1951   67 y.o.   MRN: [id number] Visit Date: 04/19/2018  Today's Provider: Lelon Huh, MD   Chief Complaint  Patient presents with  . spots all over body   Subjective:    HPI Patient comes in today c/o spots that come and go over her body. She reports that she initially found a "spot" on her lower right leg, and it lasted about 2 weeks and then went away. She then developed another "spot" on her left breast and it went away. She is concerned that the spots keep reoccurring. She denies any pain or tenderness. She has been applying OTC Neosporin and peroxide, but continues to develop new lesions.     Allergies  Allergen Reactions  . Biaxin  [Clarithromycin]     GI upset, and bad taste in mouth.  . Doxycycline Other (See Comments)     Current Outpatient Medications:  .  Biotin 10 MG CAPS, Take by mouth., Disp: , Rfl:  .  buPROPion (WELLBUTRIN SR) 150 MG 12 hr tablet, Take 150 mg by mouth daily with breakfast., Disp: , Rfl: 0 .  butalbital-acetaminophen-caffeine (FIORICET, ESGIC) 50-325-40 MG tablet, TAKE 1 TABLET BY MOUTH EVERY 6 HOURS AS NEEDED, Disp: 30 tablet, Rfl: 3 .  cyclobenzaprine (FLEXERIL) 5 MG tablet, cyclobenzaprine 5 mg tablet  TAKE 1 TABLET BY MOUTH 3 TIMES DAILY, Disp: , Rfl:  .  Dulaglutide (TRULICITY) 1.5 CB/4.4HQ SOPN, Inject 1.5 mg into the skin once a week., Disp: 4 pen, Rfl: 6 .  empagliflozin (JARDIANCE) 10 MG TABS tablet, Take 10 mg by mouth daily., Disp: 30 tablet, Rfl: 2 .  esomeprazole (NEXIUM) 40 MG capsule, TAKE 1 CAPSULE BY MOUTH EVERY DAY, Disp: 30 capsule, Rfl: 5 .  ezetimibe (ZETIA) 10 MG tablet, TAKE 1 TABLET BY MOUTH EVERY DAY, Disp: 30 tablet, Rfl: 11 .  fluconazole (DIFLUCAN) 150 MG tablet, fluconazole 150 mg tablet, Disp: , Rfl:  .  fluticasone (FLONASE) 50 MCG/ACT nasal spray, Place 2 sprays into both nostrils daily., Disp: 16 g, Rfl: 2 .  gabapentin (NEURONTIN) 300 MG capsule, TAKE 1-2  CAPSULES BY MOUTH 2 TIMES A DAY, Disp: 360 capsule, Rfl: 1 .  glipiZIDE (GLUCOTROL) 10 MG tablet, TAKE 1 TABLET BY MOUTH EVERY DAY, Disp: 90 tablet, Rfl: 0 .  hydrOXYzine (ATARAX/VISTARIL) 50 MG tablet, , Disp: , Rfl:  .  Lancet Devices MISC, , Disp: , Rfl:  .  metFORMIN (GLUCOPHAGE-XR) 500 MG 24 hr tablet, Take 1 tablet (500 mg total) by mouth daily with breakfast., Disp: 120 tablet, Rfl: 3 .  metoprolol succinate (TOPROL-XL) 25 MG 24 hr tablet, TAKE 1 TABLET BY MOUTH EVERY DAY, Disp: 30 tablet, Rfl: 3 .  metoprolol-hydrochlorothiazide (LOPRESSOR HCT) 100-25 MG tablet, metoprolol tartrate 100 mg-hydrochlorothiazide 25 mg tablet, Disp: , Rfl:  .  montelukast (SINGULAIR) 10 MG tablet, TAKE 1 TABLET BY MOUTH EVERY DAY, Disp: 30 tablet, Rfl: 2 .  Olopatadine HCl (PATADAY) 0.2 % SOLN, Pataday 0.2 % eye drops, Disp: , Rfl:  .  ondansetron (ZOFRAN) 4 MG tablet, Take 1 tablet (4 mg total) by mouth every 8 (eight) hours as needed for nausea or vomiting., Disp: 20 tablet, Rfl: 0 .  sertraline (ZOLOFT) 100 MG tablet, sertraline 100 mg tablet, Disp: , Rfl:  .  valACYclovir (VALTREX) 1000 MG tablet, Take 1 tablet daily for 5 days as needed, Disp: 30 tablet, Rfl: 3 .  valsartan-hydrochlorothiazide (DIOVAN-HCT) 320-12.5 MG  tablet, Take 0.5 tablets by mouth daily., Disp: 3 tablet, Rfl: 0 .  zolpidem (AMBIEN) 10 MG tablet, zolpidem 10 mg tablet, Disp: , Rfl:  .  meloxicam (MOBIC) 15 MG tablet, Take 1 tablet (15 mg total) by mouth daily., Disp: 30 tablet, Rfl: 0  Current Facility-Administered Medications:  .  promethazine (PHENERGAN) injection 25 mg, 25 mg, Intramuscular, Once, Pollak, Adriana M, PA-C  Review of Systems  Constitutional: Negative.   Respiratory: Negative for cough and shortness of breath.   Skin: Positive for wound. Negative for color change, pallor and rash.    Social History   Tobacco Use  . Smoking status: Never Smoker  . Smokeless tobacco: Never Used  Substance Use Topics  . Alcohol  use: No   Objective:   BP 128/74 (BP Location: Left Arm, Patient Position: Sitting, Cuff Size: Normal)   Pulse 80   Temp 98.3 F (36.8 C)   Resp 16   Wt 148 lb (67.1 kg)   SpO2 96%   BMI 28.90 kg/m  Vitals:   04/19/18 1506  BP: 128/74  Pulse: 80  Resp: 16  Temp: 98.3 F (36.8 C)  SpO2: 96%  Weight: 148 lb (67.1 kg)     Physical Exam  General appearance: alert, well developed, well nourished, cooperative and in no distress Head: Normocephalic, without obvious abnormality, atraumatic Respiratory: Respirations even and unlabored, normal respiratory rate Extremities: No gross deformities Skin: Oldest lesion on leg is about half-dollar sized yellowish lesion with appearance of mild bruising. Similar lesion on lower abdomen with small scab at center, and newest lesion on left breast is dime size scab with scant discharge with a few mm of surrounding erythema.      Assessment & Plan:     1. Cellulitis of other specified site  - Virus culture - Aerobic culture       Lelon Huh, MD  [facility]

## 2018-04-22 ENCOUNTER — Other Ambulatory Visit: Payer: Self-pay | Admitting: Family Medicine

## 2018-04-22 DIAGNOSIS — L0390 Cellulitis, unspecified: Principal | ICD-10-CM

## 2018-04-22 DIAGNOSIS — B9562 Methicillin resistant Staphylococcus aureus infection as the cause of diseases classified elsewhere: Secondary | ICD-10-CM

## 2018-04-22 LAB — AEROBIC CULTURE

## 2018-04-22 MED ORDER — SULFAMETHOXAZOLE-TRIMETHOPRIM 800-160 MG PO TABS: 2.0000 | ORAL_TABLET | Freq: Two times a day (BID) | ORAL | 0 refills | Status: AC

## 2018-04-26 NOTE — Addendum Note (Signed)
Addended by: Wilder Glade on: 04/26/2018 09:18 AM   Modules accepted: Orders

## 2018-04-29 ENCOUNTER — Telehealth: Payer: Self-pay

## 2018-04-29 ENCOUNTER — Other Ambulatory Visit: Payer: Self-pay | Admitting: Family Medicine

## 2018-04-29 DIAGNOSIS — K219 Gastro-esophageal reflux disease without esophagitis: Secondary | ICD-10-CM

## 2018-04-29 LAB — VIRUS CULTURE

## 2018-04-29 MED ORDER — FLUCONAZOLE 150 MG PO TABS: 150.0000 mg | ORAL_TABLET | Freq: Once | ORAL | 0 refills | Status: DC

## 2018-04-29 NOTE — Telephone Encounter (Signed)
Patient called the office on Friday 04/26/2018 after we were closed, and spoke with the [facility] call center. She told them that she has a yeast infection from all the antibiotics that she was recently prescribed. Patient request that Diflucan be [facility] into [facility] [address] I called patient today to get more detail on the symptoms. She says she has a vaginal itch, pressure and burning sensation. Patient denies any vaginal discharge. She also has an excessive dry mouth. Patient says the usually gets a yeast infection whenever she takes antibiotics. Patient is still requesting diflucan be called into the pharmacy. Please advise.

## 2018-05-16 ENCOUNTER — Other Ambulatory Visit: Payer: Self-pay | Admitting: Family Medicine

## 2018-05-27 ENCOUNTER — Other Ambulatory Visit: Payer: Self-pay | Admitting: Family Medicine

## 2018-05-27 DIAGNOSIS — I10 Essential (primary) hypertension: Secondary | ICD-10-CM

## 2018-06-11 DIAGNOSIS — F411 Generalized anxiety disorder: Secondary | ICD-10-CM | POA: Diagnosis not present

## 2018-06-11 DIAGNOSIS — F331 Major depressive disorder, recurrent, moderate: Secondary | ICD-10-CM | POA: Diagnosis not present

## 2018-06-11 DIAGNOSIS — G4700 Insomnia, unspecified: Secondary | ICD-10-CM | POA: Diagnosis not present

## 2018-06-19 DIAGNOSIS — H2513 Age-related nuclear cataract, bilateral: Secondary | ICD-10-CM | POA: Diagnosis not present

## 2018-06-19 LAB — HM DIABETES EYE EXAM

## 2018-06-20 ENCOUNTER — Encounter: Payer: Self-pay | Admitting: Family Medicine

## 2018-06-24 ENCOUNTER — Other Ambulatory Visit: Payer: Self-pay | Admitting: Family Medicine

## 2018-06-24 DIAGNOSIS — J0110 Acute frontal sinusitis, unspecified: Secondary | ICD-10-CM

## 2018-07-30 ENCOUNTER — Other Ambulatory Visit: Payer: Self-pay | Admitting: Family Medicine

## 2018-07-30 DIAGNOSIS — IMO0002 Reserved for concepts with insufficient information to code with codable children: Secondary | ICD-10-CM

## 2018-07-30 DIAGNOSIS — E1165 Type 2 diabetes mellitus with hyperglycemia: Principal | ICD-10-CM

## 2018-07-30 DIAGNOSIS — E1140 Type 2 diabetes mellitus with diabetic neuropathy, unspecified: Secondary | ICD-10-CM

## 2018-07-31 DIAGNOSIS — J324 Chronic pansinusitis: Secondary | ICD-10-CM | POA: Diagnosis not present

## 2018-08-16 DIAGNOSIS — H2512 Age-related nuclear cataract, left eye: Secondary | ICD-10-CM | POA: Diagnosis not present

## 2018-08-16 DIAGNOSIS — E119 Type 2 diabetes mellitus without complications: Secondary | ICD-10-CM | POA: Diagnosis not present

## 2018-08-21 ENCOUNTER — Encounter: Payer: Self-pay | Admitting: *Deleted

## 2018-08-27 ENCOUNTER — Other Ambulatory Visit: Payer: Self-pay | Admitting: Family Medicine

## 2018-08-29 ENCOUNTER — Ambulatory Visit: Admission: RE | Admit: 2018-08-29 | Payer: PPO | Source: Home / Self Care

## 2018-08-29 ENCOUNTER — Encounter: Admission: RE | Payer: Self-pay | Source: Home / Self Care

## 2018-08-29 HISTORY — DX: Carrier or suspected carrier of methicillin resistant Staphylococcus aureus: Z22.322

## 2018-08-29 SURGERY — PHACOEMULSIFICATION, CATARACT, WITH IOL INSERTION
Anesthesia: Choice | Laterality: Left

## 2018-09-11 DIAGNOSIS — F331 Major depressive disorder, recurrent, moderate: Secondary | ICD-10-CM | POA: Diagnosis not present

## 2018-09-11 DIAGNOSIS — F411 Generalized anxiety disorder: Secondary | ICD-10-CM | POA: Diagnosis not present

## 2018-09-11 DIAGNOSIS — G4700 Insomnia, unspecified: Secondary | ICD-10-CM | POA: Diagnosis not present

## 2018-09-25 ENCOUNTER — Other Ambulatory Visit: Payer: Self-pay | Admitting: Pharmacist

## 2018-09-25 NOTE — Patient Outreach (Signed)
[facility]) Care Management  [place]   09/25/2018  Katie Buckley [birth year] [id number]  Reason for referral: Medication Assistance with Trulicity + Jardiance  Referral source: Spouse Current insurance: [employer]  PMHx includes but not limited to:  T2DM, HTN, HLD, depression / anxiety, aortic stenosis, GERD, HA, obesity, genital herpes, barrett esophagus, asthma, anemia  Outreach:  Successful telephone call with Katie Buckley.  HIPAA identifiers verified.   Subjective:  Patient reports difficulty affording Trulicity + Jardiance.  She is agreeable to review medications telephonically.   Objective: Lab Results  Component Value Date   CREATININE 0.74 12/26/2017   CREATININE 0.70 11/09/2017   CREATININE 0.59 09/22/2017    Lab Results  Component Value Date   HGBA1C 11.2 (A) 11/13/2017    Lipid Panel     Component Value Date/Time   CHOL 206 (H) 05/01/2017 0842   CHOL 360 (H) 05/18/2015 0845   CHOL 244 (H) 03/28/2013 0419   TRIG 226 (H) 05/01/2017 0842   TRIG 200 03/28/2013 0419   HDL 68 05/01/2017 0842   HDL 83 05/18/2015 0845   HDL 79 (H) 03/28/2013 0419   CHOLHDL 3.0 05/01/2017 0842   VLDL 69 07/13/2016   VLDL 40 03/28/2013 0419   LDLCALC 104 (H) 05/01/2017 0842   LDLCALC 125 (H) 03/28/2013 0419    BP Readings from Last 3 Encounters:  04/19/18 128/74  03/29/18 128/64  02/04/18 (!) 150/70    Allergies  Allergen Reactions  . Biaxin  [Clarithromycin]     GI upset, and bad taste in mouth.  . Doxycycline Other (See Comments)    Emotional changes/anxious    Medications Reviewed Today    Reviewed by Sharene Butters, CPhT (Pharmacy Technician) on 08/23/18 at 1106  Med List Status: Complete  Medication Order Taking? Sig Documenting Provider Last Dose Status Informant  ARIPiprazole (ABILIFY) 5 MG tablet [id number] Yes Take 5 mg by mouth daily. [provider]  Active Self  aspirin EC 81 MG tablet  [id number]  Take 81 mg by mouth daily. [provider]  Active Self           Med Note Kenton Kingfisher, Earley Favor   Fri Aug 23, 2018 11:04 AM) On hold due to upcoming procedure.  atorvastatin (LIPITOR) 80 MG tablet [id number] Yes Take 80 mg by mouth at bedtime. [provider]  Active Self  Biotin 10 MG CAPS [id number] Yes Take 1 capsule by mouth daily with lunch.  [provider]  Active Self  buPROPion (WELLBUTRIN SR) 150 MG 12 hr tablet [id number] Yes Take 150 mg by mouth 2 (two) times daily. Morning & 3pm [provider]  Active Self  butalbital-acetaminophen-caffeine (FIORICET, ESGIC) 50-325-40 MG tablet [id number] Yes TAKE 1 TABLET BY MOUTH EVERY 6 HOURS AS NEEDED  Patient taking differently:  Take 1 tablet by mouth every 6 (six) hours as needed for headache.    [name], MD  Active Self  Cholecalciferol (VITAMIN D3 PO) [id number] Yes Take 1 tablet by mouth daily with lunch. [provider]  Active Self        Discontinued 08/23/18 1102 (No longer needed (for PRN medications))   Dulaglutide (TRULICITY) 1.5 OY/7.7AJ SOPN [id number] Yes Inject 1.5 mg into the skin once a week.  Patient taking differently:  Inject 1.5 mg into the skin every Sunday.    [name], MD  Active Self  empagliflozin (JARDIANCE) 10 MG TABS tablet [id number] Yes Take 10 mg  by mouth daily.  Patient taking differently:  Take 10 mg by mouth daily with lunch.    [name], MD  Active Self  esomeprazole (NEXIUM) 40 MG capsule [id number] Yes TAKE 1 CAPSULE BY MOUTH EVERY DAY  Patient taking differently:  Take 40 mg by mouth daily.    [name], MD  Active Self  ezetimibe (ZETIA) 10 MG tablet [id number] Yes TAKE 1 TABLET BY MOUTH EVERY DAY  Patient taking differently:  Take 10 mg by mouth at bedtime.    [name], MD  Active Self  fluconazole (DIFLUCAN) 150 MG tablet [id number]  TAKE 1 TABLET BY MOUTH AS ONE DOSE  Patient taking differently:  Take 150 mg by  mouth once.    [name], MD  Active Self           Med Note Kenton Kingfisher, Earley Favor   Fri Aug 23, 2018 11:05 AM) On hand if needed for yeast infection.  fluticasone (FLONASE) 50 MCG/ACT nasal spray [id number] Yes SPRAY 2 SPRAYS INTO EACH NOSTRIL EVERY DAY  Patient taking differently:  Place 2 sprays into both nostrils daily as needed for allergies.    [name], MD  Active Self  gabapentin (NEURONTIN) 300 MG capsule [id number] Yes TAKE 1-2 CAPSULES BY MOUTH 2 TIMES A DAY  Patient taking differently:  Take 600 mg by mouth 2 (two) times daily. Lunch & bedtime   [name], MD  Active Self  glipiZIDE (GLUCOTROL) 10 MG tablet [id number] Yes TAKE 1 TABLET BY MOUTH EVERY DAY  Patient taking differently:  Take 10 mg by mouth daily with lunch.    [name], MD  Active Self  hydrOXYzine (ATARAX/VISTARIL) 50 MG tablet [id number] Yes Take 50 mg by mouth 2 (two) times daily as needed (anxiety).  [provider]  Active Self  ketotifen (ZADITOR) 0.025 % ophthalmic solution [id number] Yes Place 1 drop into both eyes 2 (two) times daily as needed (dry/irritated eyes.). [provider]  Active Self  Lancet [employer] [place] [id number]   [provider]  Active Self           Med Note Kenton Kingfisher, Earley Favor   Fri Aug 23, 2018 10:57 AM)    MEGARED OMEGA-3 KRILL OIL PO [id number] Yes Take 1 capsule by mouth daily with lunch. [provider]  Active Self  meloxicam (MOBIC) 15 MG tablet [id number] Yes Take 1 tablet (15 mg total) by mouth daily.  Patient taking differently:  Take 15 mg by mouth daily as needed (pain.).    Hagler, Jami L, PA-C  Active Self  metFORMIN (GLUCOPHAGE-XR) 500 MG 24 hr tablet [id number] Yes Take 1 tablet (500 mg total) by mouth daily with breakfast.  Patient taking differently:  Take 500 mg by mouth daily with lunch.    [name], MD  Active Self  metoprolol succinate (TOPROL-XL) 25 MG 24 hr tablet [id number] No TAKE 1 TABLET BY MOUTH EVERY  DAY  Patient not taking:  Reported on 08/23/2018   [name], MD Not Taking Unknown time Consider Medication Status and Discontinue (Change in therapy) Self  metoprolol-hydrochlorothiazide (LOPRESSOR HCT) 100-25 MG tablet [id number] Yes Take 0.5 tablets by mouth daily with lunch.  [provider]  Active Self  montelukast (SINGULAIR) 10 MG tablet [id number] Yes TAKE 1 TABLET BY MOUTH EVERY DAY  Patient taking differently:  Take 10 mg by mouth daily as needed (allergies.).    [name], MD  Active  Self        Discontinued 08/23/18 1100 (Patient Preference)         Discontinued 08/23/18 1100 (No longer needed (for PRN medications))   promethazine (PHENERGAN) injection 25 mg [id number]   Trinna Post, PA-C  Active   sertraline (ZOLOFT) 100 MG tablet [id number] Yes Take 100 mg by mouth 2 (two) times daily.  [provider]  Active Self  valACYclovir (VALTREX) 1000 MG tablet [id number] Yes Take 1 tablet daily for 5 days as needed  Patient taking differently:  Take 1,000 mg by mouth daily as needed (cold sore/fever blisters.). 5 days as needed   [name], MD  Active Self  valsartan-hydrochlorothiazide (DIOVAN-HCT) 320-12.5 MG tablet [id number] Yes Take 0.5 tablets by mouth daily.  Patient taking differently:  Take 0.5 tablets by mouth daily with lunch.    [name], MD  Active Self        Discontinued 08/23/18 1059 (Patient Preference)           Assessment:  Drugs sorted by system:  Neurologic/Psychologic: aripiprazole, bupropion, gabapentin, hydroxyzine, sertraline  Cardiovascular: aspirin '81mg'$ , atorvastatin, ezetimibe, krill oil, metoprolol succinate, valsartan-HCTZ  Pulmonary/Allergy: fluticasone NS, montelukast  Gastrointestinal: esomeprazole  Endocrine:dulaglutide, empagliflozin, glipizide, metformin  Topical:ketotifen  Pain:butalbital-acetaminophen-caffeine, meloxicam  Infectious Diseases:fluconazole,  valacyclovir  Vitamins/Minerals/Supplements:cholecalciferol, biotin  Medication Review Findings:  . High dose of esomeprazole but may be warranted with diagnosis of Barrett esophagus.    Medication Assistance Findings:  Medication assistance needs identified. Trulicity + Jardiance  Extra Help:  Not eligible for Extra Help Low Income Subsidy based on reported income and assets  Patient Assistance Programs: Jardiance made by [place] requirement met: Yes o Out-of-pocket prescription expenditure met:   Not Applicable - Patient has met application requirements to apply for this patient assistance program.   - Reviewed program requirements with patients.   -  Trulicity made by [place] requirement met: Yes o Out-of-pocket prescription expenditure met:   Not Applicable - Patient has met application requirements to apply for this patient assistance program.   - Reviewed program requirements with patients.    Plan: . I will route patient assistance letter to [place] technician who will coordinate patient assistance program application process for medications listed above.  [facility] pharmacy technician will assist with obtaining all required documents from both patient and provider(s) and submit application(s) once completed.    Ralene Bathe, PharmD, [place] [phone]

## 2018-09-27 ENCOUNTER — Other Ambulatory Visit: Payer: Self-pay | Admitting: Pharmacy Technician

## 2018-09-27 NOTE — Patient Outreach (Addendum)
[facility]) Care Management  09/27/2018  [place] Apr 16, 1951 [id number]                          Medication Assistance Referral  Referral From: [place]: Trulicity / Ralph Ley[employer] Patient application portion:  Mailed Provider application portion: Faxed  to Dr. Caryn Section  Medication/Company: Vania Rea / [employer] Patient application portion:  Mailed Provider application portion: Faxed  to Dr. Caryn Section   Follow up:  Will follow up with patient in 7-10 business days to confirm application(s) have been received.  Maud Deed Chana Bode [place] Certified Pharmacy Technician [place] Management Direct Dial:[phone]

## 2018-09-30 ENCOUNTER — Other Ambulatory Visit: Payer: Self-pay | Admitting: Family Medicine

## 2018-10-07 ENCOUNTER — Telehealth: Payer: Self-pay | Admitting: Family Medicine

## 2018-10-07 MED ORDER — FLUCONAZOLE 150 MG PO TABS: 150.0000 mg | ORAL_TABLET | Freq: Once | ORAL | 0 refills | Status: DC

## 2018-10-07 NOTE — Telephone Encounter (Signed)
Pt has yeast infection in her mouth and vaginally.  She believes it's a side effect of the Jardiance she's taking.  Asking for the yeast infection pill to be called in for her if possible.   Please call into: [id number] [address], [place] [phone] (Phone) [phone] (Fax)   Thanks, [employer]

## 2018-10-07 NOTE — Telephone Encounter (Signed)
Please review. Thanks!  

## 2018-10-09 ENCOUNTER — Telehealth: Payer: Self-pay | Admitting: Family Medicine

## 2018-10-23 ENCOUNTER — Other Ambulatory Visit: Payer: Self-pay | Admitting: Pharmacy Technician

## 2018-10-23 NOTE — Patient Outreach (Signed)
[place] [facility]) Care Management  10/23/2018  R[place]he Roemer [place] 1950-09-17 [id number]    Successful call placed to Mr. Finerty regarding patient assistance application(s) for Trulicity and Vania Rea , HIPAA identifiers verified. Mr. Kowalchuk states that he nor his wife have received patient assistance applications that were mailed out to them. Will prepare new set of applications to be mailed out.  Follow up:  Will follow up with patients in 7-10 business days to confirm applications have been received.  Maud Deed Chana Bode [place] Certified Pharmacy Technician [place] Management Direct Dial:[phone]

## 2018-10-27 ENCOUNTER — Other Ambulatory Visit: Payer: Self-pay | Admitting: Family Medicine

## 2018-11-16 DIAGNOSIS — F331 Major depressive disorder, recurrent, moderate: Secondary | ICD-10-CM | POA: Diagnosis not present

## 2018-11-16 DIAGNOSIS — F411 Generalized anxiety disorder: Secondary | ICD-10-CM | POA: Diagnosis not present

## 2018-11-16 DIAGNOSIS — G4700 Insomnia, unspecified: Secondary | ICD-10-CM | POA: Diagnosis not present

## 2018-11-22 ENCOUNTER — Other Ambulatory Visit: Payer: Self-pay | Admitting: Pharmacist

## 2018-11-22 NOTE — Patient Outreach (Signed)
[place] [facility]) Care Management  [place] 11/22/2018  Katie Buckley [birth year] [id number]  Reason for call: f/u on patient assistance program applications mailed to patient in April and May.  Application forms have not yet been returned.   Successful call with patient's spouse. Spouse reports patient has the applications for Trulicity and Vania Rea but thinks that he and his spouse have income over the required amount for programs.  We reviewed household income and patient does still meet requirements for Trulicity and Jardiance.  Spouse voiced understanding and states he will assist patient with completing applications this weekend.    Plan: I will call patient on Monday to see if there are any questions on applications and to verify they will be mailed back to [facility].   Ralene Bathe, PharmD, [place] [phone]

## 2018-11-25 ENCOUNTER — Ambulatory Visit: Payer: Self-pay | Admitting: Pharmacist

## 2018-11-25 ENCOUNTER — Other Ambulatory Visit: Payer: Self-pay | Admitting: Pharmacist

## 2018-11-25 NOTE — Patient Outreach (Signed)
[facility]) Care Management  [place] 11/25/2018  Katie Buckley 1950-10-02 [id number]  Reason for call: f/u on patient assistance program application for Trulicity  Outreach:  Unsuccessful telephone call attempt #1 to patient. HIPAA compliant voicemail left requesting a return call  Plan:  -I will make another outreach attempt to patient within 3-4 business days.    Ralene Bathe, PharmD, [place] [phone]

## 2018-11-26 DIAGNOSIS — M65331 Trigger finger, right middle finger: Secondary | ICD-10-CM | POA: Diagnosis not present

## 2018-11-27 ENCOUNTER — Ambulatory Visit: Payer: Self-pay | Admitting: Pharmacist

## 2018-11-27 ENCOUNTER — Other Ambulatory Visit: Payer: Self-pay | Admitting: Pharmacist

## 2018-11-27 NOTE — Patient Outreach (Signed)
[place] [facility]) Care Management  [place] 11/27/2018  AYRIS CARANO 1951/02/09 [id number]  Reason for call: Reason for call: f/u call on patient assistance program (PAP) application for Trulicity.  Spoke with patient's spouse who reports he has the applications at home for the Trulicity PAP and will be able to return both his and his spouses applications to [facility].  He is aware to include proof of income.  THN will await return of documents from patient and spouse.    Ralene Bathe, PharmD, [place] [phone]

## 2018-11-28 ENCOUNTER — Other Ambulatory Visit: Payer: Self-pay | Admitting: Family Medicine

## 2018-12-15 ENCOUNTER — Other Ambulatory Visit: Payer: Self-pay | Admitting: Family Medicine

## 2018-12-15 DIAGNOSIS — J0110 Acute frontal sinusitis, unspecified: Secondary | ICD-10-CM

## 2018-12-18 ENCOUNTER — Other Ambulatory Visit: Payer: Self-pay | Admitting: Pharmacist

## 2018-12-18 NOTE — Patient Outreach (Signed)
[place] [facility]) Care Management [place]  12/18/2018  Katie Buckley 02-22-1951 [id number]  Patient assistance application forms for Trulicity have not been returned to [facility]. Unsuccessful call to patient today to discuss.  HIPAA compliant voicemail left.   [facility] pharmacy case is being closed due to the following reasons:  -Patient has not returned patient assistance programs despite multiple discussions and remailing applications to household.  -Am happy to assist in the future if patient willing to engage with [place], PharmD, [place] [phone]

## 2019-01-08 ENCOUNTER — Ambulatory Visit: Payer: Self-pay | Admitting: Ophthalmology

## 2019-01-10 ENCOUNTER — Encounter: Payer: Self-pay | Admitting: *Deleted

## 2019-01-13 ENCOUNTER — Other Ambulatory Visit: Payer: Self-pay

## 2019-01-13 ENCOUNTER — Other Ambulatory Visit
Admission: RE | Admit: 2019-01-13 | Discharge: 2019-01-13 | Disposition: A | Discharge status: Home / Self Care | Payer: PPO | Source: Ambulatory Visit | Attending: Ophthalmology | Admitting: Ophthalmology

## 2019-01-13 DIAGNOSIS — Z20828 Contact with and (suspected) exposure to other viral communicable diseases: Secondary | ICD-10-CM | POA: Diagnosis not present

## 2019-01-13 DIAGNOSIS — Z01812 Encounter for preprocedural laboratory examination: Secondary | ICD-10-CM | POA: Insufficient documentation

## 2019-01-13 LAB — SARS CORONAVIRUS 2 (TAT 6-24 HRS): SARS Coronavirus 2: NEGATIVE

## 2019-01-16 ENCOUNTER — Encounter: Payer: Self-pay | Admitting: Certified Registered"

## 2019-01-16 ENCOUNTER — Encounter
Admission: RE | Disposition: A | Discharge status: Home / Self Care | Payer: Self-pay | Source: Home / Self Care | Attending: Ophthalmology

## 2019-01-16 ENCOUNTER — Ambulatory Visit
Admission: RE | Admit: 2019-01-16 | Discharge: 2019-01-16 | Disposition: A | Discharge status: Home / Self Care | Payer: PPO | Attending: Ophthalmology | Admitting: Ophthalmology

## 2019-01-16 ENCOUNTER — Other Ambulatory Visit: Payer: Self-pay | Admitting: Family Medicine

## 2019-01-16 ENCOUNTER — Other Ambulatory Visit: Payer: Self-pay

## 2019-01-16 ENCOUNTER — Ambulatory Visit: Payer: Self-pay | Admitting: Certified Registered"

## 2019-01-16 ENCOUNTER — Encounter: Payer: Self-pay | Admitting: *Deleted

## 2019-01-16 DIAGNOSIS — K219 Gastro-esophageal reflux disease without esophagitis: Secondary | ICD-10-CM | POA: Diagnosis not present

## 2019-01-16 DIAGNOSIS — J302 Other seasonal allergic rhinitis: Secondary | ICD-10-CM | POA: Diagnosis not present

## 2019-01-16 DIAGNOSIS — Z85828 Personal history of other malignant neoplasm of skin: Secondary | ICD-10-CM | POA: Insufficient documentation

## 2019-01-16 DIAGNOSIS — IMO0002 Reserved for concepts with insufficient information to code with codable children: Secondary | ICD-10-CM

## 2019-01-16 DIAGNOSIS — H2512 Age-related nuclear cataract, left eye: Secondary | ICD-10-CM | POA: Diagnosis not present

## 2019-01-16 DIAGNOSIS — H25012 Cortical age-related cataract, left eye: Secondary | ICD-10-CM | POA: Diagnosis not present

## 2019-01-16 DIAGNOSIS — Z794 Long term (current) use of insulin: Secondary | ICD-10-CM | POA: Insufficient documentation

## 2019-01-16 DIAGNOSIS — F419 Anxiety disorder, unspecified: Secondary | ICD-10-CM | POA: Diagnosis not present

## 2019-01-16 DIAGNOSIS — E782 Mixed hyperlipidemia: Secondary | ICD-10-CM | POA: Diagnosis not present

## 2019-01-16 DIAGNOSIS — E119 Type 2 diabetes mellitus without complications: Secondary | ICD-10-CM | POA: Insufficient documentation

## 2019-01-16 DIAGNOSIS — F329 Major depressive disorder, single episode, unspecified: Secondary | ICD-10-CM | POA: Diagnosis not present

## 2019-01-16 DIAGNOSIS — E7800 Pure hypercholesterolemia, unspecified: Secondary | ICD-10-CM | POA: Insufficient documentation

## 2019-01-16 DIAGNOSIS — Z79899 Other long term (current) drug therapy: Secondary | ICD-10-CM | POA: Insufficient documentation

## 2019-01-16 DIAGNOSIS — I10 Essential (primary) hypertension: Secondary | ICD-10-CM | POA: Diagnosis not present

## 2019-01-16 DIAGNOSIS — E1140 Type 2 diabetes mellitus with diabetic neuropathy, unspecified: Secondary | ICD-10-CM | POA: Diagnosis not present

## 2019-01-16 DIAGNOSIS — E1136 Type 2 diabetes mellitus with diabetic cataract: Secondary | ICD-10-CM | POA: Diagnosis not present

## 2019-01-16 HISTORY — DX: Palpitations: R00.2

## 2019-01-16 HISTORY — DX: Other disorders of electrolyte and fluid balance, not elsewhere classified: E87.8

## 2019-01-16 HISTORY — PX: CATARACT EXTRACTION W/PHACO: SHX586

## 2019-01-16 LAB — GLUCOSE, CAPILLARY: Glucose-Capillary: 166 mg/dL — ABNORMAL HIGH (ref 70–99)

## 2019-01-16 SURGERY — PHACOEMULSIFICATION, CATARACT, WITH IOL INSERTION
Anesthesia: Monitor Anesthesia Care | Site: Eye | Laterality: Left

## 2019-01-16 MED ORDER — FENTANYL CITRATE (PF) 100 MCG/2ML IJ SOLN
INTRAMUSCULAR | Status: AC
  Filled 2019-01-16: qty 2

## 2019-01-16 MED ORDER — NA CHONDROIT SULF-NA HYALURON 40-17 MG/ML IO SOLN
INTRAOCULAR | Status: DC | PRN
  Administered 2019-01-16: 1 mL via INTRAOCULAR

## 2019-01-16 MED ORDER — ONDANSETRON HCL 4 MG/2ML IJ SOLN: INTRAMUSCULAR | Status: DC | PRN

## 2019-01-16 MED ORDER — EPINEPHRINE PF 1 MG/ML IJ SOLN
INTRAMUSCULAR | Status: AC
  Filled 2019-01-16: qty 1

## 2019-01-16 MED ORDER — EPHEDRINE SULFATE 50 MG/ML IJ SOLN
INTRAMUSCULAR | Status: AC
  Filled 2019-01-16: qty 1

## 2019-01-16 MED ORDER — TRYPAN BLUE 0.06 % OP SOLN
OPHTHALMIC | Status: AC
  Filled 2019-01-16: qty 0.5

## 2019-01-16 MED ORDER — NEOMYCIN-POLYMYXIN-DEXAMETH 3.5-10000-0.1 OP OINT
TOPICAL_OINTMENT | OPHTHALMIC | Status: AC
  Filled 2019-01-16: qty 3.5

## 2019-01-16 MED ORDER — LIDOCAINE HCL (PF) 4 % IJ SOLN
INTRAOCULAR | Status: DC | PRN
  Administered 2019-01-16: 2.25 mL via OPHTHALMIC

## 2019-01-16 MED ORDER — ARMC OPHTHALMIC DILATING DROPS
OPHTHALMIC | Status: AC
  Administered 2019-01-16: 1 via OPHTHALMIC
  Filled 2019-01-16: qty 0.5

## 2019-01-16 MED ORDER — ARMC OPHTHALMIC DILATING DROPS
1.0000 "application " | OPHTHALMIC | Status: AC
  Administered 2019-01-16 (×3): 1 via OPHTHALMIC

## 2019-01-16 MED ORDER — MOXIFLOXACIN HCL 0.5 % OP SOLN
OPHTHALMIC | Status: DC | PRN
  Administered 2019-01-16: 0.2 mL via OPHTHALMIC

## 2019-01-16 MED ORDER — MIDAZOLAM HCL 2 MG/2ML IJ SOLN
INTRAMUSCULAR | Status: DC | PRN
  Administered 2019-01-16 (×2): 1 mg via INTRAVENOUS

## 2019-01-16 MED ORDER — TETRACAINE HCL 0.5 % OP SOLN
1.0000 [drp] | OPHTHALMIC | Status: AC
  Administered 2019-01-16 (×2): 1 [drp] via OPHTHALMIC

## 2019-01-16 MED ORDER — LIDOCAINE HCL (PF) 4 % IJ SOLN
INTRAMUSCULAR | Status: AC
  Filled 2019-01-16: qty 5

## 2019-01-16 MED ORDER — NA HYALUR & NA CHOND-NA HYALUR 0.55-0.5 ML IO KIT
PACK | INTRAOCULAR | Status: DC | PRN
  Administered 2019-01-16: 1 via OPHTHALMIC

## 2019-01-16 MED ORDER — SODIUM CHLORIDE 0.9 % IV SOLN
INTRAVENOUS | Status: DC
  Administered 2019-01-16: 08:00:00 via INTRAVENOUS

## 2019-01-16 MED ORDER — TETRACAINE HCL 0.5 % OP SOLN
OPHTHALMIC | Status: AC
  Administered 2019-01-16: 1 [drp] via OPHTHALMIC
  Filled 2019-01-16: qty 4

## 2019-01-16 MED ORDER — CARBACHOL 0.01 % IO SOLN
INTRAOCULAR | Status: DC | PRN
  Administered 2019-01-16: 0.5 mL via INTRAOCULAR

## 2019-01-16 MED ORDER — TRYPAN BLUE 0.06 % OP SOLN
OPHTHALMIC | Status: DC | PRN
  Administered 2019-01-16: 0.5 mL via INTRAOCULAR

## 2019-01-16 MED ORDER — MOXIFLOXACIN HCL 0.5 % OP SOLN
OPHTHALMIC | Status: AC
  Filled 2019-01-16: qty 3

## 2019-01-16 MED ORDER — FENTANYL CITRATE (PF) 100 MCG/2ML IJ SOLN
INTRAMUSCULAR | Status: DC | PRN
  Administered 2019-01-16 (×2): 25 ug via INTRAVENOUS

## 2019-01-16 MED ORDER — MIDAZOLAM HCL 2 MG/2ML IJ SOLN
INTRAMUSCULAR | Status: AC
  Filled 2019-01-16: qty 2

## 2019-01-16 MED ORDER — MOXIFLOXACIN HCL 0.5 % OP SOLN: 1.0000 [drp] | OPHTHALMIC | Status: DC | PRN

## 2019-01-16 MED ORDER — DORZOLAMIDE HCL-TIMOLOL MAL 2-0.5 % OP SOLN
OPHTHALMIC | Status: AC
  Filled 2019-01-16: qty 10

## 2019-01-16 MED ORDER — POVIDONE-IODINE 5 % OP SOLN
OPHTHALMIC | Status: DC | PRN
  Administered 2019-01-16: 1 via OPHTHALMIC

## 2019-01-16 MED ORDER — NA CHONDROIT SULF-NA HYALURON 40-30 MG/ML IO SOLN
INTRAOCULAR | Status: AC
  Filled 2019-01-16: qty 0.5

## 2019-01-16 MED ORDER — ONDANSETRON HCL 4 MG/2ML IJ SOLN
INTRAMUSCULAR | Status: AC
  Filled 2019-01-16: qty 2

## 2019-01-16 MED ORDER — NA CHONDROIT SULF-NA HYALURON 40-17 MG/ML IO SOLN
INTRAOCULAR | Status: AC
  Filled 2019-01-16: qty 1

## 2019-01-16 MED ORDER — ONDANSETRON HCL 4 MG/2ML IJ SOLN
INTRAMUSCULAR | Status: DC | PRN
  Administered 2019-01-16: 4 mg via INTRAVENOUS

## 2019-01-16 MED ORDER — SODIUM HYALURONATE 23 MG/ML IO SOLN
INTRAOCULAR | Status: AC
  Filled 2019-01-16: qty 0.6

## 2019-01-16 MED ORDER — EPINEPHRINE PF 1 MG/ML IJ SOLN
INTRAOCULAR | Status: DC | PRN
  Administered 2019-01-16: 08:00:00 1 mL via OPHTHALMIC

## 2019-01-16 MED ORDER — POVIDONE-IODINE 5 % OP SOLN
OPHTHALMIC | Status: AC
  Filled 2019-01-16: qty 30

## 2019-01-16 MED ORDER — NA HYALUR & NA CHOND-NA HYALUR 0.55-0.5 ML IO KIT
PACK | INTRAOCULAR | Status: AC
  Filled 2019-01-16: qty 1.05

## 2019-01-16 MED ORDER — PHENYLEPHRINE HCL (PRESSORS) 10 MG/ML IV SOLN
INTRAVENOUS | Status: AC
  Filled 2019-01-16: qty 1

## 2019-01-16 SURGICAL SUPPLY — 18 items
DISSECTOR HYDRO NUCLEUS 50X22 (MISCELLANEOUS) ×8 IMPLANT
DRSG TEGADERM 2-3/8X2-3/4 SM (GAUZE/BANDAGES/DRESSINGS) ×2 IMPLANT
GLOVE BIOGEL M 6.5 STRL (GLOVE) ×2 IMPLANT
GOWN STRL REUS W/ TWL LRG LVL3 (GOWN DISPOSABLE) ×1 IMPLANT
GOWN STRL REUS W/ TWL XL LVL3 (GOWN DISPOSABLE) ×1 IMPLANT
GOWN STRL REUS W/TWL LRG LVL3 (GOWN DISPOSABLE) ×1
GOWN STRL REUS W/TWL XL LVL3 (GOWN DISPOSABLE) ×1
KNIFE 45D UP 2.3 (MISCELLANEOUS) ×2 IMPLANT
LABEL CATARACT MEDS ST (LABEL) ×2 IMPLANT
LENS IOL ACRSF IQ ULTRA 18.5 (Intraocular Lens) ×1 IMPLANT
LENS IOL ACRYSOF IQ 18.5 (Intraocular Lens) ×2 IMPLANT
PACK CATARACT (MISCELLANEOUS) ×2 IMPLANT
PACK CATARACT KING (MISCELLANEOUS) ×2 IMPLANT
PACK EYE AFTER SURG (MISCELLANEOUS) ×2 IMPLANT
SOL BSS BAG (MISCELLANEOUS) ×2
SOLUTION BSS BAG (MISCELLANEOUS) ×1 IMPLANT
WATER STERILE IRR 250ML POUR (IV SOLUTION) ×2 IMPLANT
WIPE NON LINTING 3.25X3.25 (MISCELLANEOUS) ×2 IMPLANT

## 2019-01-16 NOTE — Discharge Instructions (Addendum)
Eye Surgery Discharge Instructions  Expect mild scratchy sensation or mild soreness. DO NOT RUB YOUR EYE!  The day of surgery:  Minimal physical activity, but bed rest is not required  No reading, computer work, or close hand work  No bending, lifting, or straining.  May watch TV  For 24 hours:  No driving, legal decisions, or alcoholic beverages  Safety precautions  Eat anything you prefer: It is better to start with liquids, then soup then solid foods.  Eye patch should be worn until postoperative exam tomorrow.   Resume all regular medications including aspirin or Coumadin if these were discontinued prior to surgery. You may shower, bathe, shave, or wash your hair. Tylenol may be taken for mild discomfort.  Call your doctor if you experience significant pain, nausea, or vomiting, fever > 101 or other signs of infection. [phone] or [phone] Specific instructions:  Follow-up Information    Marchia Meiers, MD Follow up.   Specialty: Ophthalmology Why: 01/17/19 @ 10:15 am  Contact information: [place] [address] [phone]                 Follow-up Information    Marchia Meiers, MD Follow up.   Specialty: Ophthalmology Contact information: [address] [place] [address] [phone]

## 2019-01-16 NOTE — Transfer of Care (Signed)
Immediate Anesthesia Transfer of Care Note  Patient: Katie Buckley  Procedure(s) Performed: CATARACT EXTRACTION PHACO AND INTRAOCULAR LENS PLACEMENT (IOC) LEFT DIABETES VISION BLUE (Left Eye)  Patient Location: PACU  Anesthesia Type:MAC  Level of Consciousness: awake, alert  and oriented  Airway & Oxygen Therapy: Patient Spontanous Breathing  Post-op Assessment: Report given to RN and Post -op Vital signs reviewed and stable  Post vital signs: Reviewed and stable  Last Vitals:  Vitals Value Taken Time  BP 107/53 01/16/19 0829  Temp 36.1 C 01/16/19 0829  Pulse 64 01/16/19 0829  Resp 16 01/16/19 0829  SpO2 94 % 01/16/19 0829    Last Pain:  Vitals:   01/16/19 0829  TempSrc: Temporal  PainSc: 0-No pain         Complications: No apparent anesthesia complications

## 2019-01-16 NOTE — Anesthesia Preprocedure Evaluation (Signed)
Anesthesia Evaluation  Patient identified by MRN, date of birth, ID band Patient awake    Reviewed: Allergy & Precautions, H&P , NPO status , Patient's Chart, lab work & pertinent test results, reviewed documented beta blocker date and time   History of Anesthesia Complications Negative for: history of anesthetic complications  Airway Mallampati: III  TM Distance: <3 FB Neck ROM: full    Dental no notable dental hx. (+) Missing, Caps, Dental Advidsory Given   Pulmonary neg pulmonary ROS,    Pulmonary exam normal        Cardiovascular Exercise Tolerance: Good hypertension, (-) angina(-) CAD, (-) Past MI, (-) Cardiac Stents and (-) CABG negative cardio ROS Normal cardiovascular exam(-) dysrhythmias + Valvular Problems/Murmurs      Neuro/Psych PSYCHIATRIC DISORDERS (Depression and anxiety) Anxiety Depression negative neurological ROS     GI/Hepatic Neg liver ROS, GERD  ,  Endo/Other  diabetes, Insulin Dependent  Renal/GU negative Renal ROS  negative genitourinary   Musculoskeletal   Abdominal   Peds  Hematology  (+) Blood dyscrasia, anemia ,   Anesthesia Other Findings Past Medical History:   GERD (gastroesophageal reflux disease)          04/21/2015      Comment:History of Barrett's esophagus    Heart murmur                                                 Seasonal allergies                                           Diabetes mellitus without complication (HCC)                 Depression                                                   Anxiety                                                      Skin cancer                                                    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   Reproductive/Obstetrics negative OB ROS                             Anesthesia Physical  Anesthesia Plan  ASA: III  Anesthesia Plan: MAC   Post-op Pain Management:     Induction:   PONV Risk Score and Plan: Midazolam  Airway Management Planned: Natural Airway and Nasal Cannula  Additional Equipment:   Intra-op Plan:   Post-operative Plan:   Informed Consent: I have reviewed the patients History and Physical, chart, labs and discussed  the procedure including the risks, benefits and alternatives for the proposed anesthesia with the patient or authorized representative who has indicated his/her understanding and acceptance.     Dental Advisory Given  Plan Discussed with: Anesthesiologist, CRNA and Surgeon  Anesthesia Plan Comments:         Anesthesia Quick Evaluation

## 2019-01-16 NOTE — Anesthesia Postprocedure Evaluation (Signed)
Anesthesia Post Note  Patient: Katie Buckley  Procedure(s) Performed: CATARACT EXTRACTION PHACO AND INTRAOCULAR LENS PLACEMENT (IOC) LEFT DIABETES VISION BLUE (Left Eye)  Patient location during evaluation: PACU Anesthesia Type: MAC Level of consciousness: awake, awake and alert and oriented Pain management: pain level controlled Vital Signs Assessment: post-procedure vital signs reviewed and stable Respiratory status: spontaneous breathing Cardiovascular status: blood pressure returned to baseline Postop Assessment: no headache and no apparent nausea or vomiting Anesthetic complications: no     Last Vitals:  Vitals:   01/16/19 0625 01/16/19 0829  BP: (!) 143/88 (!) 107/53  Pulse: 72 64  Resp: 16 16  Temp: 36.8 C (!) 36.1 C  SpO2: 99% 94%    Last Pain:  Vitals:   01/16/19 0829  TempSrc: Temporal  PainSc: 0-No pain                 Ravon Mcilhenny Lorenza Chick

## 2019-01-16 NOTE — H&P (Signed)
   I have reviewed the patient's H&P and agree with its findings. There have been no interval changes.  Sumiya Mamaril MD Ophthalmology 

## 2019-01-16 NOTE — Op Note (Signed)
  PREOPERATIVE DIAGNOSIS:  Nuclear sclerotic cataract of the LEFT eye.   POSTOPERATIVE DIAGNOSIS:  Nuclear sclerotic cataract of the LEFT eye.   OPERATIVE PROCEDURE: Cataract surgery OS   SURGEON:  Marchia Meiers, MD.   ANESTHESIA:  Anesthesiologist: Martha Clan, MD CRNA: Kelton Pillar, CRNA; Hedda Slade, CRNA  1.      Managed anesthesia care. 2.     0.67ml of Shugarcaine was instilled following the paracentesis   COMPLICATIONS:  None.   TECHNIQUE:   Divide and conquer   DESCRIPTION OF PROCEDURE:  The patient was examined and consented in the preoperative holding area where the aforementioned topical anesthesia was applied to the LEFT eye and then brought back to the Operating Room where the left eye was prepped and draped in the usual sterile ophthalmic fashion and a lid speculum was placed. A paracentesis was created with the side port blade, the anterior chamber was washed out with trypan blue to stain the anterior capsule, and the anterior chamber was filled with viscoelastic. A near clear corneal incision was performed with the steel keratome. A continuous curvilinear capsulorrhexis was performed with a cystotome followed by the capsulorrhexis forceps. Hydrodissection and hydrodelineation were carried out with BSS on a blunt cannula. The lens was removed in a divide and conquer  technique and the remaining cortical material was removed with the irrigation-aspiration handpiece. The capsular bag was inflated with viscoelastic and the lens was placed in the capsular bag without complication. The remaining viscoelastic was removed from the eye with the irrigation-aspiration handpiece. The wounds were hydrated. The anterior chamber was flushed and the eye was inflated to physiologic pressure. 0.49ml Vigamox was placed in the anterior chamber. The wounds were found to be water tight. The eye was dressed with Vigamox. The patient was given protective glasses to wear throughout the day  and a shield with which to sleep tonight. The patient was also given drops with which to begin a drop regimen today and will follow-up with me in one day. Implant Name Type Inv. Item Serial No. Manufacturer Lot No. LRB No. Used Action  LENS IOL ACRYSOF IQ 18.5 - [id number] Intraocular Lens LENS IOL ACRYSOF IQ 18.5 [id number] ALCON  Left 1 Implanted    Procedure(s) with comments: CATARACT EXTRACTION PHACO AND INTRAOCULAR LENS PLACEMENT (IOC) LEFT DIABETES VISION BLUE (Left) - [place]  00:47 CDE 6.62 Fluid pack lot # [id number]  Electronically signed: Marchia Meiers 01/16/2019 8:37 AM

## 2019-01-16 NOTE — Anesthesia Post-op Follow-up Note (Signed)
Anesthesia QCDR form completed.        

## 2019-01-22 ENCOUNTER — Encounter: Payer: Self-pay | Admitting: *Deleted

## 2019-01-29 DIAGNOSIS — H2511 Age-related nuclear cataract, right eye: Secondary | ICD-10-CM | POA: Diagnosis not present

## 2019-01-29 DIAGNOSIS — I10 Essential (primary) hypertension: Secondary | ICD-10-CM | POA: Diagnosis not present

## 2019-01-30 ENCOUNTER — Other Ambulatory Visit: Payer: Self-pay | Admitting: Family Medicine

## 2019-01-30 DIAGNOSIS — E1140 Type 2 diabetes mellitus with diabetic neuropathy, unspecified: Secondary | ICD-10-CM

## 2019-01-30 DIAGNOSIS — IMO0002 Reserved for concepts with insufficient information to code with codable children: Secondary | ICD-10-CM

## 2019-01-31 ENCOUNTER — Other Ambulatory Visit: Payer: Self-pay | Admitting: Family Medicine

## 2019-01-31 DIAGNOSIS — IMO0002 Reserved for concepts with insufficient information to code with codable children: Secondary | ICD-10-CM

## 2019-01-31 DIAGNOSIS — E1140 Type 2 diabetes mellitus with diabetic neuropathy, unspecified: Secondary | ICD-10-CM

## 2019-02-03 ENCOUNTER — Other Ambulatory Visit
Admission: RE | Admit: 2019-02-03 | Discharge: 2019-02-03 | Disposition: A | Discharge status: Home / Self Care | Payer: PPO | Source: Ambulatory Visit | Attending: Ophthalmology | Admitting: Ophthalmology

## 2019-02-03 ENCOUNTER — Other Ambulatory Visit: Payer: Self-pay

## 2019-02-03 DIAGNOSIS — Z20828 Contact with and (suspected) exposure to other viral communicable diseases: Secondary | ICD-10-CM | POA: Diagnosis not present

## 2019-02-03 DIAGNOSIS — Z01812 Encounter for preprocedural laboratory examination: Secondary | ICD-10-CM | POA: Diagnosis not present

## 2019-02-03 DIAGNOSIS — H2511 Age-related nuclear cataract, right eye: Secondary | ICD-10-CM | POA: Insufficient documentation

## 2019-02-03 LAB — SARS CORONAVIRUS 2 (TAT 6-24 HRS): SARS Coronavirus 2: NEGATIVE

## 2019-02-06 ENCOUNTER — Ambulatory Visit
Admission: RE | Admit: 2019-02-06 | Discharge: 2019-02-06 | Disposition: A | Discharge status: Home / Self Care | Payer: PPO | Attending: Ophthalmology | Admitting: Ophthalmology

## 2019-02-06 ENCOUNTER — Ambulatory Visit: Payer: PPO | Admitting: Certified Registered Nurse Anesthetist

## 2019-02-06 ENCOUNTER — Encounter
Admission: RE | Disposition: A | Discharge status: Home / Self Care | Payer: Self-pay | Source: Home / Self Care | Attending: Ophthalmology

## 2019-02-06 DIAGNOSIS — Z8614 Personal history of Methicillin resistant Staphylococcus aureus infection: Secondary | ICD-10-CM | POA: Insufficient documentation

## 2019-02-06 DIAGNOSIS — R011 Cardiac murmur, unspecified: Secondary | ICD-10-CM | POA: Diagnosis not present

## 2019-02-06 DIAGNOSIS — Z881 Allergy status to other antibiotic agents status: Secondary | ICD-10-CM | POA: Insufficient documentation

## 2019-02-06 DIAGNOSIS — J45909 Unspecified asthma, uncomplicated: Secondary | ICD-10-CM | POA: Diagnosis not present

## 2019-02-06 DIAGNOSIS — E1140 Type 2 diabetes mellitus with diabetic neuropathy, unspecified: Secondary | ICD-10-CM | POA: Insufficient documentation

## 2019-02-06 DIAGNOSIS — E119 Type 2 diabetes mellitus without complications: Secondary | ICD-10-CM | POA: Diagnosis not present

## 2019-02-06 DIAGNOSIS — Z961 Presence of intraocular lens: Secondary | ICD-10-CM | POA: Insufficient documentation

## 2019-02-06 DIAGNOSIS — E1136 Type 2 diabetes mellitus with diabetic cataract: Secondary | ICD-10-CM | POA: Diagnosis not present

## 2019-02-06 DIAGNOSIS — Z9049 Acquired absence of other specified parts of digestive tract: Secondary | ICD-10-CM | POA: Diagnosis not present

## 2019-02-06 DIAGNOSIS — H2511 Age-related nuclear cataract, right eye: Secondary | ICD-10-CM | POA: Insufficient documentation

## 2019-02-06 DIAGNOSIS — Z6830 Body mass index (BMI) 30.0-30.9, adult: Secondary | ICD-10-CM | POA: Diagnosis not present

## 2019-02-06 DIAGNOSIS — I10 Essential (primary) hypertension: Secondary | ICD-10-CM | POA: Insufficient documentation

## 2019-02-06 DIAGNOSIS — F329 Major depressive disorder, single episode, unspecified: Secondary | ICD-10-CM | POA: Insufficient documentation

## 2019-02-06 DIAGNOSIS — E669 Obesity, unspecified: Secondary | ICD-10-CM | POA: Diagnosis not present

## 2019-02-06 DIAGNOSIS — F418 Other specified anxiety disorders: Secondary | ICD-10-CM | POA: Diagnosis not present

## 2019-02-06 DIAGNOSIS — E7800 Pure hypercholesterolemia, unspecified: Secondary | ICD-10-CM | POA: Insufficient documentation

## 2019-02-06 DIAGNOSIS — Z9842 Cataract extraction status, left eye: Secondary | ICD-10-CM | POA: Insufficient documentation

## 2019-02-06 DIAGNOSIS — Z85828 Personal history of other malignant neoplasm of skin: Secondary | ICD-10-CM | POA: Diagnosis not present

## 2019-02-06 DIAGNOSIS — Z888 Allergy status to other drugs, medicaments and biological substances status: Secondary | ICD-10-CM | POA: Diagnosis not present

## 2019-02-06 DIAGNOSIS — E785 Hyperlipidemia, unspecified: Secondary | ICD-10-CM | POA: Diagnosis not present

## 2019-02-06 DIAGNOSIS — K219 Gastro-esophageal reflux disease without esophagitis: Secondary | ICD-10-CM | POA: Insufficient documentation

## 2019-02-06 HISTORY — PX: CATARACT EXTRACTION W/PHACO: SHX586

## 2019-02-06 HISTORY — DX: Myoneural disorder, unspecified: G70.9

## 2019-02-06 LAB — GLUCOSE, CAPILLARY
Glucose-Capillary: 198 mg/dL — ABNORMAL HIGH (ref 70–99)
Glucose-Capillary: 191 mg/dL — ABNORMAL HIGH (ref 70–99)

## 2019-02-06 SURGERY — PHACOEMULSIFICATION, CATARACT, WITH IOL INSERTION
Anesthesia: Monitor Anesthesia Care | Site: Eye | Laterality: Right

## 2019-02-06 MED ORDER — NA HYALUR & NA CHOND-NA HYALUR 0.55-0.5 ML IO KIT
PACK | INTRAOCULAR | Status: DC | PRN
  Administered 2019-02-06: 1 via OPHTHALMIC

## 2019-02-06 MED ORDER — ONDANSETRON HCL 4 MG/2ML IJ SOLN
INTRAMUSCULAR | Status: DC | PRN
  Administered 2019-02-06: 4 mg via INTRAVENOUS

## 2019-02-06 MED ORDER — TRYPAN BLUE 0.06 % OP SOLN
OPHTHALMIC | Status: AC
  Filled 2019-02-06: qty 0.5

## 2019-02-06 MED ORDER — CARBACHOL 0.01 % IO SOLN
INTRAOCULAR | Status: DC | PRN
  Administered 2019-02-06: 0.5 mL via INTRAOCULAR

## 2019-02-06 MED ORDER — MOXIFLOXACIN HCL 0.5 % OP SOLN
OPHTHALMIC | Status: DC | PRN
  Administered 2019-02-06: 0.2 mL via OPHTHALMIC

## 2019-02-06 MED ORDER — NA HYALUR & NA CHOND-NA HYALUR 0.55-0.5 ML IO KIT
PACK | INTRAOCULAR | Status: AC
  Filled 2019-02-06: qty 1.05

## 2019-02-06 MED ORDER — EPINEPHRINE PF 1 MG/ML IJ SOLN
INTRAMUSCULAR | Status: AC
  Filled 2019-02-06: qty 2

## 2019-02-06 MED ORDER — DORZOLAMIDE HCL-TIMOLOL MAL 2-0.5 % OP SOLN
OPHTHALMIC | Status: DC | PRN
  Administered 2019-02-06: 1 [drp] via OPHTHALMIC

## 2019-02-06 MED ORDER — ARMC OPHTHALMIC DILATING DROPS
1.0000 "application " | OPHTHALMIC | Status: AC
  Administered 2019-02-06 (×2): 1 via OPHTHALMIC

## 2019-02-06 MED ORDER — SODIUM CHLORIDE 0.9 % IV SOLN
INTRAVENOUS | Status: DC
  Administered 2019-02-06: 07:00:00 via INTRAVENOUS

## 2019-02-06 MED ORDER — ONDANSETRON HCL 4 MG/2ML IJ SOLN: 4.0000 mg | Freq: Once | INTRAMUSCULAR | Status: DC | PRN

## 2019-02-06 MED ORDER — FENTANYL CITRATE (PF) 100 MCG/2ML IJ SOLN
INTRAMUSCULAR | Status: DC | PRN
  Administered 2019-02-06: 50 ug via INTRAVENOUS

## 2019-02-06 MED ORDER — FENTANYL CITRATE (PF) 100 MCG/2ML IJ SOLN: 25.0000 ug | INTRAMUSCULAR | Status: DC | PRN

## 2019-02-06 MED ORDER — FENTANYL CITRATE (PF) 100 MCG/2ML IJ SOLN
INTRAMUSCULAR | Status: AC
  Filled 2019-02-06: qty 2

## 2019-02-06 MED ORDER — EPINEPHRINE PF 1 MG/ML IJ SOLN
INTRAOCULAR | Status: DC | PRN
  Administered 2019-02-06: 08:00:00 1 mL via OPHTHALMIC

## 2019-02-06 MED ORDER — LIDOCAINE HCL (PF) 4 % IJ SOLN
INTRAMUSCULAR | Status: AC
  Filled 2019-02-06: qty 5

## 2019-02-06 MED ORDER — MIDAZOLAM HCL 2 MG/2ML IJ SOLN
INTRAMUSCULAR | Status: AC
  Filled 2019-02-06: qty 2

## 2019-02-06 MED ORDER — POVIDONE-IODINE 5 % OP SOLN
OPHTHALMIC | Status: DC | PRN
  Administered 2019-02-06: 1 via OPHTHALMIC

## 2019-02-06 MED ORDER — NA CHONDROIT SULF-NA HYALURON 40-17 MG/ML IO SOLN
INTRAOCULAR | Status: AC
  Filled 2019-02-06: qty 1

## 2019-02-06 MED ORDER — NEOMYCIN-POLYMYXIN-DEXAMETH 3.5-10000-0.1 OP OINT
TOPICAL_OINTMENT | OPHTHALMIC | Status: AC
  Filled 2019-02-06: qty 3.5

## 2019-02-06 MED ORDER — TRYPAN BLUE 0.06 % OP SOLN
OPHTHALMIC | Status: DC | PRN
  Administered 2019-02-06: 0.5 mL via INTRAOCULAR

## 2019-02-06 MED ORDER — TETRACAINE HCL 0.5 % OP SOLN
OPHTHALMIC | Status: AC
  Administered 2019-02-06: 1 [drp] via OPHTHALMIC
  Filled 2019-02-06: qty 4

## 2019-02-06 MED ORDER — NA CHONDROIT SULF-NA HYALURON 40-17 MG/ML IO SOLN
INTRAOCULAR | Status: DC | PRN
  Administered 2019-02-06: 1 mL via INTRAOCULAR

## 2019-02-06 MED ORDER — ARMC OPHTHALMIC DILATING DROPS
OPHTHALMIC | Status: AC
  Administered 2019-02-06: 1 via OPHTHALMIC
  Filled 2019-02-06: qty 0.5

## 2019-02-06 MED ORDER — MIDAZOLAM HCL 2 MG/2ML IJ SOLN
INTRAMUSCULAR | Status: DC | PRN
  Administered 2019-02-06 (×2): 1 mg via INTRAVENOUS

## 2019-02-06 MED ORDER — TETRACAINE HCL 0.5 % OP SOLN
1.0000 [drp] | Freq: Once | OPHTHALMIC | Status: AC
  Administered 2019-02-06 (×2): 1 [drp] via OPHTHALMIC

## 2019-02-06 MED ORDER — POVIDONE-IODINE 5 % OP SOLN
OPHTHALMIC | Status: AC
  Filled 2019-02-06: qty 30

## 2019-02-06 MED ORDER — MOXIFLOXACIN HCL 0.5 % OP SOLN
OPHTHALMIC | Status: AC
  Filled 2019-02-06: qty 3

## 2019-02-06 MED ORDER — MOXIFLOXACIN HCL 0.5 % OP SOLN: 1.0000 [drp] | Freq: Once | OPHTHALMIC | Status: DC

## 2019-02-06 MED ORDER — LIDOCAINE HCL (PF) 4 % IJ SOLN
INTRAOCULAR | Status: DC | PRN
  Administered 2019-02-06: 08:00:00 4 mL via OPHTHALMIC

## 2019-02-06 MED ORDER — NEOMYCIN-POLYMYXIN-DEXAMETH 3.5-10000-0.1 OP OINT
TOPICAL_OINTMENT | OPHTHALMIC | Status: DC | PRN
  Administered 2019-02-06: 1 via OPHTHALMIC

## 2019-02-06 SURGICAL SUPPLY — 19 items
BNDG EYE OVAL (GAUZE/BANDAGES/DRESSINGS) ×1 IMPLANT
DISSECTOR HYDRO NUCLEUS 50X22 (MISCELLANEOUS) ×8 IMPLANT
DRSG TEGADERM 2-3/8X2-3/4 SM (GAUZE/BANDAGES/DRESSINGS) ×2 IMPLANT
GLOVE BIOGEL M 6.5 STRL (GLOVE) ×2 IMPLANT
GOWN STRL REUS W/ TWL LRG LVL3 (GOWN DISPOSABLE) ×1 IMPLANT
GOWN STRL REUS W/ TWL XL LVL3 (GOWN DISPOSABLE) ×1 IMPLANT
GOWN STRL REUS W/TWL LRG LVL3 (GOWN DISPOSABLE) ×1
GOWN STRL REUS W/TWL XL LVL3 (GOWN DISPOSABLE) ×1
KNIFE 45D UP 2.3 (MISCELLANEOUS) ×2 IMPLANT
LABEL CATARACT MEDS ST (LABEL) ×2 IMPLANT
LENS IOL ACRSF IQ ULTRA 18.0 (Intraocular Lens) IMPLANT
LENS IOL ACRYSOF IQ 18.0 (Intraocular Lens) ×2 IMPLANT
PACK CATARACT (MISCELLANEOUS) ×2 IMPLANT
PACK CATARACT KING (MISCELLANEOUS) ×2 IMPLANT
PACK EYE AFTER SURG (MISCELLANEOUS) ×2 IMPLANT
SOL BSS BAG (MISCELLANEOUS) ×2
SOLUTION BSS BAG (MISCELLANEOUS) ×1 IMPLANT
WATER STERILE IRR 250ML POUR (IV SOLUTION) ×2 IMPLANT
WIPE NON LINTING 3.25X3.25 (MISCELLANEOUS) ×2 IMPLANT

## 2019-02-06 NOTE — Transfer of Care (Signed)
Immediate Anesthesia Transfer of Care Note  Patient: Katie Buckley  Procedure(s) Performed: CATARACT EXTRACTION PHACO AND INTRAOCULAR LENS PLACEMENT (IOC) (Right Eye)  Patient Location: PACU  Anesthesia Type:MAC  Level of Consciousness: awake, alert  and oriented  Airway & Oxygen Therapy: Patient Spontanous Breathing  Post-op Assessment: Report given to RN, Post -op Vital signs reviewed and stable and Patient moving all extremities X 4  Post vital signs: Reviewed and stable  Last Vitals:  Vitals Value Taken Time  BP 120/51 02/06/19 0820  Temp 36.4 C 02/06/19 0820  Pulse 79 02/06/19 0820  Resp 16 02/06/19 0820  SpO2 95 % 02/06/19 0820    Last Pain:  Vitals:   02/06/19 0820  TempSrc: Temporal  PainSc: 0-No pain         Complications: No apparent anesthesia complications

## 2019-02-06 NOTE — Discharge Instructions (Addendum)
Eye Surgery Discharge Instructions  Expect mild scratchy sensation or mild soreness. DO NOT RUB YOUR EYE!  The day of surgery:  Minimal physical activity, but bed rest is not required  No reading, computer work, or close hand work  No bending, lifting, or straining.  May watch TV  For 24 hours:  No driving, legal decisions, or alcoholic beverages  Safety precautions  Eat anything you prefer: It is better to start with liquids, then soup then solid foods.  Keep eye patch in place - Do not remove  Resume all regular medications including aspirin or Coumadin if these were discontinued prior to surgery. You may shower, bathe, shave, or wash your hair. Tylenol may be taken for mild discomfort.  Call youw Dr. Sharene Butters eye drop instruction sheet as reviewed.r doctor if you experience significant pain, nausea, or vomiting, fever > 101 or other signs of infection. [phone] or [phone] Specific instructions:  Follow-up Information    Katie Meiers, MD Follow up.   Specialty: Ophthalmology Why: 02-07-19 @ 9:30 am  Contact information: [address] [place] [address] [phone]

## 2019-02-06 NOTE — Anesthesia Post-op Follow-up Note (Signed)
Anesthesia QCDR form completed.        

## 2019-02-06 NOTE — H&P (Signed)
   I have reviewed the patient's H&P and agree with its findings. There have been no interval changes.  Aiman Sonn MD Ophthalmology 

## 2019-02-06 NOTE — Anesthesia Preprocedure Evaluation (Signed)
Anesthesia Evaluation  Patient identified by MRN, date of birth, ID band Patient awake    Reviewed: Allergy & Precautions, NPO status , Patient's Chart, lab work & pertinent test results, reviewed documented beta blocker date and time   History of Anesthesia Complications Negative for: history of anesthetic complications  Airway Mallampati: III       Dental   Pulmonary neg sleep apnea, neg COPD, Not current smoker,           Cardiovascular hypertension, Pt. on medications and Pt. on home beta blockers (-) Past MI and (-) CHF (-) dysrhythmias + Valvular Problems/Murmurs (murmur, no tx)      Neuro/Psych neg Seizures Anxiety Depression    GI/Hepatic Neg liver ROS, GERD  Medicated and Controlled,  Endo/Other  diabetes, Type 2, Oral Hypoglycemic Agents  Renal/GU negative Renal ROS     Musculoskeletal   Abdominal   Peds  Hematology   Anesthesia Other Findings   Reproductive/Obstetrics                             Anesthesia Physical Anesthesia Plan  ASA: III  Anesthesia Plan: MAC   Post-op Pain Management:    Induction:   PONV Risk Score and Plan:   Airway Management Planned: Nasal Cannula  Additional Equipment:   Intra-op Plan:   Post-operative Plan:   Informed Consent: I have reviewed the patients History and Physical, chart, labs and discussed the procedure including the risks, benefits and alternatives for the proposed anesthesia with the patient or authorized representative who has indicated his/her understanding and acceptance.       Plan Discussed with:   Anesthesia Plan Comments:         Anesthesia Quick Evaluation

## 2019-02-06 NOTE — Op Note (Signed)
  PREOPERATIVE DIAGNOSIS:  Nuclear sclerotic cataract of the RIGHT eye.   POSTOPERATIVE DIAGNOSIS:  Nuclear sclerotic cataract of the RIGHT eye.   OPERATIVE PROCEDURE: Cataract surgery OD   SURGEON:  Marchia Meiers, MD.   ANESTHESIA:  Anesthesiologist: Gunnar Fusi, MD CRNA: Caryl Asp, CRNA  1.      Managed anesthesia care. 2.     0.31ml of Shugarcaine was instilled following the paracentesis   COMPLICATIONS:  None.   TECHNIQUE:   Divide and conquer   DESCRIPTION OF PROCEDURE:  The patient was examined and consented in the preoperative holding area where the aforementioned topical anesthesia was applied to the RIGHT eye and then brought back to the Operating Room where the RIGHT eye was prepped and draped in the usual sterile ophthalmic fashion and a lid speculum was placed. A paracentesis was created with the side port blade, the anterior chamber was washed out with trypan blue to stain the anterior capsule, and the anterior chamber was filled with viscoelastic. A near clear corneal incision was performed with the steel keratome. A continuous curvilinear capsulorrhexis was performed with a cystotome followed by the capsulorrhexis forceps. Hydrodissection and hydrodelineation were carried out with BSS on a blunt cannula. The lens was removed in a divide and conquer  technique and the remaining cortical material was removed with the irrigation-aspiration handpiece. The capsular bag was inflated with viscoelastic and the lens was placed in the capsular bag without complication. The remaining viscoelastic was removed from the eye with the irrigation-aspiration handpiece. The wounds were hydrated. The anterior chamber was flushed and the eye was inflated to physiologic pressure. 0.8ml Vigamox was placed in the anterior chamber. The wounds were found to be water tight. The eye was dressed with Vigamox. The patient was given protective glasses to wear throughout the day and a shield with  which to sleep tonight. The patient was also given drops with which to begin a drop regimen today and will follow-up with me in one day. Implant Name Type Inv. Item Serial No. Manufacturer Lot No. LRB No. Used Action  LENS IOL ACRYSOF IQ 18.0 - [id number] Intraocular Lens LENS IOL ACRYSOF IQ 18.0 [id number] ALCON  Right 1 Implanted    Procedure(s) with comments: CATARACT EXTRACTION PHACO AND INTRAOCULAR LENS PLACEMENT (IOC) (Right) - [place] 00:54.6 CDE 6.52 FLUID PACK LOT # [id number]  Electronically signed: Marchia Meiers 02/06/2019 10:33 AM

## 2019-02-06 NOTE — Anesthesia Postprocedure Evaluation (Signed)
Anesthesia Post Note  Patient: Katie Buckley  Procedure(s) Performed: CATARACT EXTRACTION PHACO AND INTRAOCULAR LENS PLACEMENT (IOC) (Right Eye)  Patient location during evaluation: PACU Anesthesia Type: MAC Level of consciousness: awake and alert Pain management: pain level controlled Vital Signs Assessment: post-procedure vital signs reviewed and stable Respiratory status: spontaneous breathing, nonlabored ventilation, respiratory function stable and patient connected to nasal cannula oxygen Cardiovascular status: stable and blood pressure returned to baseline Postop Assessment: no apparent nausea or vomiting Anesthetic complications: no     Last Vitals:  Vitals:   02/06/19 0619 02/06/19 0820  BP: (!) 142/90 (!) 120/51  Pulse: 77 79  Resp: 16 16  Temp: (!) 36.2 C (!) 36.4 C  SpO2: 97% 95%    Last Pain:  Vitals:   02/06/19 0820  TempSrc: Temporal  PainSc: 0-No pain                 Rosebud Poles C

## 2019-02-07 ENCOUNTER — Other Ambulatory Visit: Payer: Self-pay | Admitting: Family Medicine

## 2019-02-07 ENCOUNTER — Encounter: Payer: Self-pay | Admitting: Ophthalmology

## 2019-02-12 ENCOUNTER — Other Ambulatory Visit: Payer: Self-pay | Admitting: Family Medicine

## 2019-02-20 DIAGNOSIS — S40012A Contusion of left shoulder, initial encounter: Secondary | ICD-10-CM | POA: Diagnosis not present

## 2019-02-26 ENCOUNTER — Other Ambulatory Visit: Payer: Self-pay | Admitting: Family Medicine

## 2019-03-17 DIAGNOSIS — F331 Major depressive disorder, recurrent, moderate: Secondary | ICD-10-CM | POA: Diagnosis not present

## 2019-03-17 DIAGNOSIS — G4700 Insomnia, unspecified: Secondary | ICD-10-CM | POA: Diagnosis not present

## 2019-03-17 DIAGNOSIS — F411 Generalized anxiety disorder: Secondary | ICD-10-CM | POA: Diagnosis not present

## 2019-03-21 ENCOUNTER — Other Ambulatory Visit: Payer: Self-pay | Admitting: Family Medicine

## 2019-04-03 ENCOUNTER — Other Ambulatory Visit: Payer: Self-pay | Admitting: Family Medicine

## 2019-04-03 DIAGNOSIS — Z1231 Encounter for screening mammogram for malignant neoplasm of breast: Secondary | ICD-10-CM

## 2019-04-18 ENCOUNTER — Other Ambulatory Visit: Payer: Self-pay | Admitting: Family Medicine

## 2019-04-22 ENCOUNTER — Other Ambulatory Visit: Payer: Self-pay | Admitting: Family Medicine

## 2019-05-20 ENCOUNTER — Other Ambulatory Visit: Payer: Self-pay | Admitting: Family Medicine

## 2019-06-17 ENCOUNTER — Other Ambulatory Visit: Payer: Self-pay

## 2019-06-17 ENCOUNTER — Ambulatory Visit (INDEPENDENT_AMBULATORY_CARE_PROVIDER_SITE_OTHER): Payer: PPO | Admitting: Family Medicine

## 2019-06-17 ENCOUNTER — Encounter: Payer: Self-pay | Admitting: Family Medicine

## 2019-06-17 VITALS — BP 154/62 | HR 91 | Temp 97.1°F | Resp 16 | Ht 60.0 in | Wt 147.0 lb

## 2019-06-17 DIAGNOSIS — J0111 Acute recurrent frontal sinusitis: Secondary | ICD-10-CM | POA: Diagnosis not present

## 2019-06-17 DIAGNOSIS — Z1159 Encounter for screening for other viral diseases: Secondary | ICD-10-CM | POA: Diagnosis not present

## 2019-06-17 DIAGNOSIS — E2839 Other primary ovarian failure: Secondary | ICD-10-CM | POA: Diagnosis not present

## 2019-06-17 DIAGNOSIS — I10 Essential (primary) hypertension: Secondary | ICD-10-CM | POA: Diagnosis not present

## 2019-06-17 DIAGNOSIS — E538 Deficiency of other specified B group vitamins: Secondary | ICD-10-CM | POA: Diagnosis not present

## 2019-06-17 DIAGNOSIS — E1140 Type 2 diabetes mellitus with diabetic neuropathy, unspecified: Secondary | ICD-10-CM

## 2019-06-17 DIAGNOSIS — Z0000 Encounter for general adult medical examination without abnormal findings: Secondary | ICD-10-CM | POA: Diagnosis not present

## 2019-06-17 DIAGNOSIS — E1165 Type 2 diabetes mellitus with hyperglycemia: Secondary | ICD-10-CM | POA: Diagnosis not present

## 2019-06-17 DIAGNOSIS — E782 Mixed hyperlipidemia: Secondary | ICD-10-CM | POA: Diagnosis not present

## 2019-06-17 DIAGNOSIS — IMO0002 Reserved for concepts with insufficient information to code with codable children: Secondary | ICD-10-CM

## 2019-06-17 MED ORDER — AMOXICILLIN 500 MG PO CAPS: 1000.0000 mg | ORAL_CAPSULE | Freq: Three times a day (TID) | ORAL | 0 refills | Status: AC

## 2019-06-17 MED ORDER — FLUCONAZOLE 150 MG PO TABS: 150.0000 mg | ORAL_TABLET | Freq: Once | ORAL | 5 refills | Status: AC

## 2019-06-17 NOTE — Patient Instructions (Signed)
Please review the attached list of medications and notify my office if there are any errors.   Please bring all of your medications to every appointment so we can make sure that our medication list is the same as yours.   You are due for a Tdap (tetanus-diptheria-pertussis vaccine) which protects you from tetanus and whooping cough. Please check with your insurance plan or pharmacy regarding coverage for this vaccine.   

## 2019-06-17 NOTE — Progress Notes (Signed)
Patient: Katie Buckley, Female    DOB: 09/16/1950, 69 y.o.   MRN: [id number] Visit Date: 06/17/2019  Today's Provider: Lelon Huh, MD   Chief Complaint  Patient presents with  . Medicare Wellness  . Diabetes  . Hypertension  . Annual Exam   Subjective:     Annual wellness visit [place] is a 69 y.o. female. She feels fairly well. She reports no regular exercising . She reports she is sleeping fairly well.  -----------------------------------------------------------  Diabetes Mellitus Type II, Follow-up:   Lab Results  Component Value Date   HGBA1C 11.2 (A) 11/13/2017   HGBA1C >14.0 04/18/2017   HGBA1C 8.5 07/13/2016    Last seen for diabetes 1 years ago.  Management since then includes no changes. She reports good compliance with treatment. She is not having side effects.  Current symptoms include none and have been stable. Home blood sugar records: blood sugars are not checked at home  Episodes of hypoglycemia? no   Current insulin regiment: Trulicity Most Recent Eye Exam: 06/19/2018 Weight trend: fluctuating a bit Prior visit with dietician: No Current exercise: none Current diet habits: in general, an "unhealthy" diet  Pertinent Labs:    Component Value Date/Time   CHOL 206 (H) 05/01/2017 0842   CHOL 360 (H) 05/18/2015 0845   CHOL 244 (H) 03/28/2013 0419   TRIG 226 (H) 05/01/2017 0842   TRIG 200 03/28/2013 0419   HDL 68 05/01/2017 0842   HDL 83 05/18/2015 0845   HDL 79 (H) 03/28/2013 0419   LDLCALC 104 (H) 05/01/2017 0842   LDLCALC 125 (H) 03/28/2013 0419   CREATININE 0.74 12/26/2017 1616   CREATININE 0.61 05/01/2017 0842    Wt Readings from Last 3 Encounters:  06/17/19 147 lb (66.7 kg)  01/22/19 153 lb 15.9 oz (69.8 kg)  01/16/19 154 lb 5.2 oz (70 kg)    ------------------------------------------------------------------------  Hypertension, follow-up:  BP Readings from Last 3 Encounters:  06/17/19 (!) 154/62  02/06/19  (!) 120/51  01/16/19 (!) 107/53    She was last seen for hypertension 1 years ago.  BP at that visit was 128/66. Management since that visit includes resuming lower dose of Metoprolol ER 25mg  daily. Valsartan/ HCTZ was reduced to 1/2 tablet of 320-25mg  daily. She reports fair compliance with treatment. Has been out of Valsartan for a couple of weeks.  She is not having side effects.  She is not exercising. She is adherent to low salt diet.   Outside blood pressures are not checked. She is experiencing none.  Patient denies chest pain, chest pressure/discomfort, claudication, dyspnea, exertional chest pressure/discomfort, fatigue, irregular heart beat, lower extremity edema, near-syncope, orthopnea, palpitations, paroxysmal nocturnal dyspnea, syncope and tachypnea.   Cardiovascular risk factors include advanced age (older than 53 for men, 52 for women), diabetes mellitus and hypertension.  Use of agents associated with hypertension: none.     Weight trend: fluctuating a bit Wt Readings from Last 3 Encounters:  06/17/19 147 lb (66.7 kg)  01/22/19 153 lb 15.9 oz (69.8 kg)  01/16/19 154 lb 5.2 oz (70 kg)    Current diet: in general, an "unhealthy" diet  ------------------------------------------------------------------------   Review of Systems  Constitutional: Negative for chills, fatigue and fever.  HENT: Positive for congestion, mouth sores, sinus pressure and voice change. Negative for ear pain, rhinorrhea, sneezing and sore throat.   Eyes: Negative.  Negative for pain and redness.  Respiratory: Negative for cough, shortness of breath and wheezing.  Cardiovascular: Negative for chest pain and leg swelling.  Gastrointestinal: Negative for abdominal pain, blood in stool, constipation, diarrhea and nausea.  Endocrine: Negative for polydipsia and polyphagia.  Genitourinary: Negative.  Negative for dysuria, flank pain, hematuria, pelvic pain, vaginal bleeding and vaginal discharge.    Musculoskeletal: Negative for arthralgias, back pain, gait problem and joint swelling.  Skin: Negative for rash.  Allergic/Immunologic: Positive for environmental allergies.  Neurological: Positive for dizziness and headaches. Negative for tremors, seizures, weakness, light-headedness and numbness.  Hematological: Negative for adenopathy.  Psychiatric/Behavioral: Negative.  Negative for behavioral problems, confusion and dysphoric mood. The patient is not nervous/anxious and is not hyperactive.     Social History   Socioeconomic History  . Marital status: Married    Spouse name: [name], husband  . Number of children: Not on file  . Years of education: Not on file  . Highest education level: Not on file  Occupational History    Comment: retired  Tobacco Use  . Smoking status: Never Smoker  . Smokeless tobacco: Never Used  Substance and Sexual Activity  . Alcohol use: No  . Drug use: No  . Sexual activity: Not Currently    Birth control/protection: Post-menopausal  Other Topics Concern  . Not on file  Social History Narrative  . Not on file   Social Determinants of Health   Financial Resource Strain:   . Difficulty of Paying Living Expenses: Not on file  Food Insecurity:   . Worried About Charity fundraiser in the Last Year: Not on file  . Ran Out of Food in the Last Year: Not on file  Transportation Needs:   . Lack of Transportation (Medical): Not on file  . Lack of Transportation (Non-Medical): Not on file  Physical Activity:   . Days of Exercise per Week: Not on file  . Minutes of Exercise per Session: Not on file  Stress:   . Feeling of Stress : Not on file  Social Connections:   . Frequency of Communication with Friends and Family: Not on file  . Frequency of Social Gatherings with Friends and Family: Not on file  . Attends Religious Services: Not on file  . Active Member of Clubs or Organizations: Not on file  . Attends Archivist Meetings: Not on  file  . Marital Status: Not on file  Intimate Partner Violence:   . Fear of Current or Ex-Partner: Not on file  . Emotionally Abused: Not on file  . Physically Abused: Not on file  . Sexually Abused: Not on file    Past Medical History:  Diagnosis Date  . Anxiety   . Depression   . Diabetes mellitus without complication ([place])   . Frequent sinus infections   . GERD (gastroesophageal reflux disease) 04/21/2015   History of Barrett's esophagus   . Heart murmur   . Hyperchloremia   . Hypertension   . MRSA (methicillin resistant staph aureus) culture positive 2018   history of, in hand  . Neuromuscular disorder (HCC)    neuropathy  . Palpitations   . Seasonal allergies   . Skin cancer    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     Patient Active Problem List   Diagnosis Date Noted  . MRSA cellulitis 04/19/2018  . Decreased libido 02/04/2018  . Uncontrolled type 2 diabetes mellitus with diabetic neuropathy, without long-term current use of insulin ([place]) 04/20/2017  . H/O adenomatous polyp of colon 08/20/2015  . Rupture of implant  of right breast 05/18/2015  . Anxiety disorder 04/26/2015  . Allergic rhinitis 04/21/2015  . Anemia 04/21/2015  . Asthma 04/21/2015  . 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 Chronic infection of sinus 04/21/2015  . Depression 04/21/2015  . GERD (gastroesophageal reflux disease) 04/21/2015  . Headache 04/21/2015  . Menopausal symptoms 04/21/2015  . Obesity 04/21/2015  . Palpitations 04/21/2015  . Vitamin B12 deficiency 04/21/2015  . Aortic stenosis 01/21/2013  . Diastolic dysfunction [id number]  . Diabetes mellitus out of control ([place]) 06/12/2006  . Barrett esophagus 06/13/2003  . Essential hypertension 06/12/1998  . Genital herpes 06/12/1998  . Hyperlipidemia, mixed 06/12/1998    Past Surgical History:  Procedure Laterality Date  . AUGMENTATION MAMMAPLASTY Bilateral    breast implants  . Bone Spur  2007   foot  . CARDIAC  CATHETERIZATION    . Carpal Tunnel Symdrome  2008   as repeated in 2009  . CATARACT EXTRACTION W/PHACO Left 01/16/2019   Procedure: CATARACT EXTRACTION PHACO AND INTRAOCULAR LENS PLACEMENT ([place]) LEFT DIABETES VISION BLUE;  Surgeon: Marchia Meiers, MD;  Location: [facility] ORS;  Service: Ophthalmology;  Laterality: Left;  [place]  00:47 CDE 6.62 Fluid pack lot # [id number]  . CATARACT EXTRACTION W/PHACO Right 02/06/2019   Procedure: CATARACT EXTRACTION PHACO AND INTRAOCULAR LENS PLACEMENT (IOC);  Surgeon: Marchia Meiers, MD;  Location: [facility] ORS;  Service: Ophthalmology;  Laterality: Right;  [place] 00:54.6 CDE 6.52 FLUID PACK LOT # [id number]  . CHOLECYSTECTOMY  2007  . COLONOSCOPY WITH PROPOFOL N/A 11/17/2015   Procedure: COLONOSCOPY WITH PROPOFOL;  Surgeon: Manya Silvas, MD;  Location: [facility] ENDOSCOPY;  Service: Endoscopy;  Laterality: N/A;  . ESOPHAGOGASTRODUODENOSCOPY (EGD) WITH PROPOFOL N/A 11/17/2015   Procedure: ESOPHAGOGASTRODUODENOSCOPY (EGD) WITH PROPOFOL;  Surgeon: Manya Silvas, MD;  Location: [facility] ENDOSCOPY;  Service: Endoscopy;  Laterality: N/A;  . EYE SURGERY Left    cataract extraction  . FOOT SURGERY    . MANDIBLE SURGERY    . NASAL SINUS SURGERY  03/2013   Multile surgery Dr. Carlis Abbott. 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 Release of Trigger Finger Right 10/2010   Dr. Tamala Julian  . TONSILLECTOMY  1960    Her family history includes Breast cancer in her maternal aunt; Colon cancer in her brother; Diabetes in her father; Irritable bowel syndrome in her sister; Lung cancer in her mother.   Current Outpatient Medications:  .  ARIPiprazole (ABILIFY) 5 MG tablet, Take 5 mg by mouth daily., Disp: , Rfl:  .  atorvastatin (LIPITOR) 80 MG tablet, Take 80 mg by mouth at bedtime., Disp: , Rfl:  .  Biotin 10 MG CAPS, Take 10 mg by mouth daily after lunch., Disp: , Rfl:  .  buPROPion (WELLBUTRIN SR) 150 MG 12 hr tablet, Take 150 mg by mouth 2 (two) times daily. Morning & 3pm, Disp: , Rfl: 0 .   butalbital-acetaminophen-caffeine (FIORICET) 50-325-40 MG tablet, TAKE 1 TABLET BY MOUTH EVERY 6 (SIX) HOURS AS NEEDED FOR HEADACHE., Disp: 30 tablet, Rfl: 3 .  Cholecalciferol (VITAMIN D3 PO), Take 1 tablet by mouth daily with lunch., Disp: , Rfl:  .  empagliflozin (JARDIANCE) 10 MG TABS tablet, Take 10 mg by mouth daily., Disp: 30 tablet, Rfl: 1 .  esomeprazole (NEXIUM) 20 MG capsule, Take 40 mg by mouth daily before breakfast. , Disp: , Rfl:  .  ezetimibe (ZETIA) 10 MG tablet, TAKE 1 TABLET BY MOUTH EVERYDAY AT BEDTIME, Disp: 30 tablet, Rfl: 12 .  fexofenadine (ALLEGRA) 180 MG tablet, Take 180 mg  by mouth daily as needed for allergies or rhinitis., Disp: , Rfl:  .  fluticasone (FLONASE) 50 MCG/ACT nasal spray, Place 2 sprays into both nostrils daily as needed for allergies., Disp: 48 g, Rfl: 3 .  gabapentin (NEURONTIN) 300 MG capsule, TAKE 1-2 CAPSULES BY MOUTH 2 TIMES A DAY (Patient taking differently: Take 600 mg by mouth 2 (two) times daily. Morning & supper), Disp: 360 capsule, Rfl: 4 .  glipiZIDE (GLUCOTROL) 10 MG tablet, Take 1 tablet (10 mg total) by mouth daily before breakfast., Disp: 90 tablet, Rfl: 1 .  hydrOXYzine (ATARAX/VISTARIL) 50 MG tablet, Take 50 mg by mouth 2 (two) times daily. Morning & 3pm, Disp: , Rfl:  .  ketotifen (ZADITOR) 0.025 % ophthalmic solution, Place 1 drop into both eyes 2 (two) times daily as needed (dry/irritated eyes.)., Disp: , Rfl:  .  Lancet Devices MISC, , Disp: , Rfl:  .  meloxicam (MOBIC) 15 MG tablet, Take 1 tablet (15 mg total) by mouth daily. (Patient taking differently: Take 15 mg by mouth daily as needed (pain.). ), Disp: 30 tablet, Rfl: 0 .  metFORMIN (GLUCOPHAGE-XR) 500 MG 24 hr tablet, TAKE 1 TABLET BY MOUTH EVERY DAY WITH BREAKFAST, Disp: 90 tablet, Rfl: 0 .  metoprolol succinate (TOPROL-XL) 25 MG 24 hr tablet, TAKE 1 TABLET BY MOUTH EVERY DAY, Disp: 30 tablet, Rfl: 0 .  metoprolol-hydrochlorothiazide (LOPRESSOR HCT) 100-25 MG tablet, TAKE 1/2  TABLET BY MOUTH EVERY DAY, Disp: 30 tablet, Rfl: 0 .  montelukast (SINGULAIR) 10 MG tablet, TAKE 1 TABLET BY MOUTH EVERY DAY (Patient taking differently: Take 10 mg by mouth daily as needed (allergies.). ), Disp: 30 tablet, Rfl: 2 .  sertraline (ZOLOFT) 100 MG tablet, Take 200 mg by mouth daily. , Disp: , Rfl:  .  traZODone (DESYREL) 50 MG tablet, Take 50-100 mg by mouth at bedtime as needed for sleep. , Disp: , Rfl:  .  TRULICITY 1.5 0000000 SOPN, INJECT 1.5 MG INTO THE SKIN ONCE A WEEK., Disp: 2 pen, Rfl: 12 .  valACYclovir (VALTREX) 1000 MG tablet, Take 1 tablet daily for 5 days as needed, Disp: 30 tablet, Rfl: 3 .  valsartan-hydrochlorothiazide (DIOVAN-HCT) 320-12.5 MG tablet, Take 0.5 tablets by mouth daily. (Patient taking differently: Take 1 tablet by mouth daily after lunch. ), Disp: 3 tablet, Rfl: 0  Patient Care Team: [name], MD as PCP - General (Family Medicine)    Objective:    Vitals: BP (!) 154/62 (BP Location: Right Arm, Cuff Size: Normal)   Pulse 91   Temp (!) 97.1 F (36.2 C) (Temporal)   Resp 16   Ht 5' (1.524 m)   Wt 147 lb (66.7 kg)   SpO2 97% Comment: room air  BMI 28.71 kg/m   Physical Exam    General Appearance:    Well developed, well nourished female. Alert, cooperative, in no acute distress, appears stated age   Head:    Normocephalic, without obvious abnormality, atraumatic  Eyes:    PERRL, conjunctiva/corneas clear, EOM's intact, fundi    benign, both eyes  Ears:    Normal TM's and external ear canals, both ears  Nose:   Nares normal, septum midline, mucosa normal, no drainage. Tender left frontal and maxillary sinuses.   Throat:   Lips, mucosa, and tongue normal; teeth and gums normal  Neck:   Supple, symmetrical, trachea midline, no adenopathy;    thyroid:  no enlargement/tenderness/nodules; no carotid   bruit or JVD  Back:  Symmetric, no curvature, ROM normal, no CVA tenderness  Lungs:     Clear to auscultation bilaterally,  respirations unlabored  Chest Wall:    No tenderness or deformity   Heart:    Normal heart rate. Normal rhythm.  2/6 harsh, crescendo-decrescendo, systolic murmur at right upper sternal border  Breast Exam:    normal appearance, no masses or tenderness  Abdomen:     Soft, non-tender, bowel sounds active all four quadrants,    no masses, no organomegaly  Pelvic:    deferred. Done by gyn  Extremities:   All extremities are intact. No cyanosis or edema  Pulses:   2+ and symmetric all extremities  Skin:   Skin color, texture, turgor normal, no rashes or lesions  Lymph nodes:   Cervical, supraclavicular, and axillary nodes normal  Neurologic:   CNII-XII intact, normal strength, sensation and reflexes    throughout    Activities of Daily Living In your present state of health, do you have any difficulty performing the following activities: 06/17/2019  Hearing? N  Vision? N  Difficulty concentrating or making decisions? N  Walking or climbing stairs? N  Dressing or bathing? N  Doing errands, shopping? N  Some recent data might be hidden    Fall Risk Assessment Fall Risk  06/17/2019 01/13/2019 11/13/2017 11/13/2017 11/13/2017  Falls in the past year? 1 (No Data) Yes Yes Yes  Comment - Emmi Telephone Survey: data to providers prior to load - - -  Number falls in past yr: 0 (No Data) 1 1 1   Comment - Emmi Telephone Survey Actual Response =  - - -  Injury with Fall? 0 - No No No  Follow up Falls evaluation completed - Falls evaluation completed Falls prevention discussed -     Depression Screen PHQ 2/9 Scores 06/17/2019 08/29/2016 08/29/2016  PHQ - 2 Score 0 2 2  PHQ- 9 Score 2 - 6   Cognitive Testing - 6-CIT  Correct? Score   What year is it? yes 0 0 or 4  What month is it? yes 0 0 or 3  Memorize:    Pia Mau,  43,  [place],      What time is it? (within 1 hour) yes 0 0 or 3  Count backwards from 20 yes 0 0, 2, or 4  Name the months of the year yes 0 0, 2, or 4  Repeat name &  address above yes 0 0, 2, 4, 6, 8, or 10       TOTAL SCORE  0/28   Interpretation:  Normal  Normal (0-7) Abnormal (8-28)   No flowsheet data found.    Assessment & Plan:     Annual Wellness Visit  Reviewed patient's Family Medical History Reviewed and updated list of patient's medical providers Assessment of cognitive impairment was done Assessed patient's functional ability Established a written schedule for health screening [place] Completed and Reviewed  Exercise Activities and Dietary recommendations Goals   None     Immunization History  Administered Date(s) Administered  . Hep A / Hep B 02/15/2011, 03/29/2011, 09/06/2011  . Influenza, High Dose Seasonal PF 04/18/2017  . Influenza,inj,Quad PF,6+ Mos 04/23/2015, 02/17/2016  . Influenza-Unspecified 03/25/2019  . Pneumococcal Conjugate-13 04/18/2017  . Pneumococcal Polysaccharide-23 11/27/2011, 03/29/2018  . Tdap 10/09/2007  . Zoster 11/27/2011    Health Maintenance  Topic Date Due  . Hepatitis C Screening  [birth year]  . FOOT EXAM  08[birth year]10/1961  .  DEXA SCAN  08/10/2016  . TETANUS/TDAP  10/08/2017  . HEMOGLOBIN A1C  05/15/2018  . OPHTHALMOLOGY EXAM  06/20/2019  . MAMMOGRAM  03/15/2020  . COLONOSCOPY  11/16/2020  . INFLUENZA VACCINE  Completed  . PNA vac Low Risk Adult  Completed     Discussed health benefits of physical activity, and encouraged her to engage in regular exercise appropriate for her age and condition.    ------------------------------------------------------------------------------------------------------------ 1. Medicare annual wellness visit, subsequent   2. Uncontrolled type 2 diabetes mellitus with diabetic neuropathy, without long-term current use of insulin (HCC) Poor control due to very poor compliance of patient with medications and follow ups. Counseled on importance of compliance to reduced diabetic complications. She did not bring her medications today and  cannot say whether she has atorvastatin prescription which I have no record of prescribing for her but is on her medication list. Will reconcile her medications by phone when we call her with lab results.  - HgB A1c  3. Vitamin B12 deficiency   4. Essential hypertension bp elevated today which is likely due to inconsistent intake of her blood pressure medications.   5. Hyperlipidemia, mixed Advised she does need to be on a statin. Will reconcile medication by phone to determine whether she is actually taking atorvastatin or not.  - Comprehensive metabolic panel - Lipid panel (fasting) - CBC  6. Need for hepatitis C screening test  - Hepatitis C antibody  7. Estrogen deficiency  - DG Bone Density for estrogen deficiency; Future  8. Acute recurrent frontal sinusitis  - amoxicillin (AMOXIL) 500 MG capsule; Take 2 capsules (1,000 mg total) by mouth 3 (three) times daily for 5 days.  Dispense: 30 capsule; Refill: 0 - fluconazole (DIFLUCAN) 150 MG tablet; Take 1 tablet (150 mg total) by mouth once for 1 dose.  Dispense: 2 tablet; Refill: [place], MD  [facility]

## 2019-06-18 ENCOUNTER — Telehealth: Payer: Self-pay

## 2019-06-18 LAB — COMPREHENSIVE METABOLIC PANEL
ALT: 10 IU/L (ref 0–32)
AST: 16 IU/L (ref 0–40)
Albumin/Globulin Ratio: 2.1 (ref 1.2–2.2)
Albumin: 4 g/dL (ref 3.8–4.8)
Alkaline Phosphatase: 131 IU/L — ABNORMAL HIGH (ref 39–117)
BUN/Creatinine Ratio: 16 (ref 12–28)
BUN: 10 mg/dL (ref 8–27)
Bilirubin Total: <0.2 mg/dL (ref 0.0–1.2)
CO2: 24 mmol/L (ref 20–29)
Calcium: 9.1 mg/dL (ref 8.7–10.3)
Chloride: 98 mmol/L (ref 96–106)
Creatinine, Ser: 0.62 mg/dL (ref 0.57–1.00)
GFR calc Af Amer: 108 mL/min/{1.73_m2} (ref >59)
GFR calc non Af Amer: 94 mL/min/{1.73_m2} (ref >59)
Globulin, Total: 1.9 g/dL (ref 1.5–4.5)
Glucose: 400 mg/dL — ABNORMAL HIGH (ref 65–99)
Potassium: 4.4 mmol/L (ref 3.5–5.2)
Sodium: 135 mmol/L (ref 134–144)
Total Protein: 5.9 g/dL — ABNORMAL LOW (ref 6.0–8.5)

## 2019-06-18 LAB — CBC
Hematocrit: 36 % (ref 34.0–46.6)
Hemoglobin: 11.9 g/dL (ref 11.1–15.9)
MCH: 26.4 pg — ABNORMAL LOW (ref 26.6–33.0)
MCHC: 33.1 g/dL (ref 31.5–35.7)
MCV: 80 fL (ref 79–97)
Platelets: 232 10*3/uL (ref 150–450)
RBC: 4.51 x10E6/uL (ref 3.77–5.28)
RDW: 13.5 % (ref 11.7–15.4)
WBC: 6.3 10*3/uL (ref 3.4–10.8)

## 2019-06-18 LAB — LIPID PANEL
Chol/HDL Ratio: 4.1 ratio (ref 0.0–4.4)
Cholesterol, Total: 319 mg/dL — ABNORMAL HIGH (ref 100–199)
HDL: 78 mg/dL (ref >39)
LDL Chol Calc (NIH): 190 mg/dL — ABNORMAL HIGH (ref 0–99)
Triglycerides: 264 mg/dL — ABNORMAL HIGH (ref 0–149)
VLDL Cholesterol Cal: 51 mg/dL — ABNORMAL HIGH (ref 5–40)

## 2019-06-18 LAB — HEMOGLOBIN A1C
Est. average glucose Bld gHb Est-mCnc: 292 mg/dL
Hgb A1c MFr Bld: 11.8 % — ABNORMAL HIGH (ref 4.8–5.6)

## 2019-06-18 LAB — HEPATITIS C ANTIBODY: Hep C Virus Ab: <0.1 s/co ratio (ref 0.0–0.9)

## 2019-06-18 NOTE — Telephone Encounter (Signed)
-----   Message from [name], MD sent at 06/18/2019  7:42 AM EST ----- Blood sugar is very high at 400. A1c is 11.8. and cholesterol is very high at 319. She is at extremely high risk for heart attacks and strokes.  Patient did not have her medications at her o.v. and it's not clear what medications she is currently taking. Please reconcile her medications over the phone and then we will need to make some changes to get her cholesterol and sugar under control.

## 2019-06-18 NOTE — Telephone Encounter (Signed)
Tried calling patient. Left message to call back. 

## 2019-06-18 NOTE — Telephone Encounter (Signed)
Patient advised of results. Patient wasn't at home to review medications. She doesn't get off work until after 6pm. I sent patient a email with the link to sign up for [facility]. Patient states she is going to sign up for mychart and review the listed medications then compare them to what she has at home. Patient plans to send [place] a message tonight to let us know what she is taking.

## 2019-06-19 DIAGNOSIS — F331 Major depressive disorder, recurrent, moderate: Secondary | ICD-10-CM | POA: Diagnosis not present

## 2019-06-19 DIAGNOSIS — G4700 Insomnia, unspecified: Secondary | ICD-10-CM | POA: Diagnosis not present

## 2019-06-19 DIAGNOSIS — F411 Generalized anxiety disorder: Secondary | ICD-10-CM | POA: Diagnosis not present

## 2019-06-24 MED ORDER — ROSUVASTATIN CALCIUM 20 MG PO TABS: 20.0000 mg | ORAL_TABLET | Freq: Every day | ORAL | 3 refills | Status: DC

## 2019-06-24 NOTE — Addendum Note (Signed)
Addended by: Lelon Huh E on: 06/24/2019 01:03 PM   Modules accepted: Orders

## 2019-06-24 NOTE — Telephone Encounter (Signed)
LMTCB, Okay for [facility] to advise patient and schedule follow up appointment.

## 2019-06-24 NOTE — Telephone Encounter (Signed)
Have sent prescription for rosuvastatin to her pharmacy. Need to schedule follow up for uncontrolled diabetes and cholesterol in 1 month

## 2019-06-25 ENCOUNTER — Ambulatory Visit
Admission: RE | Admit: 2019-06-25 | Discharge: 2019-06-25 | Disposition: A | Discharge status: Home / Self Care | Payer: PPO | Source: Ambulatory Visit | Attending: Family Medicine | Admitting: Family Medicine

## 2019-06-25 ENCOUNTER — Other Ambulatory Visit: Payer: Self-pay | Admitting: Family Medicine

## 2019-06-25 DIAGNOSIS — Z1231 Encounter for screening mammogram for malignant neoplasm of breast: Secondary | ICD-10-CM | POA: Diagnosis not present

## 2019-06-25 DIAGNOSIS — I10 Essential (primary) hypertension: Secondary | ICD-10-CM

## 2019-06-25 NOTE — Telephone Encounter (Signed)
Patient wants to know if she should continue taking the Zetia along with the Rosuvastatin?

## 2019-06-25 NOTE — Telephone Encounter (Signed)
Yes, she needs to take both

## 2019-06-25 NOTE — Telephone Encounter (Signed)
She should stop the atorvastatin and take rosuvastatin instead, continue Zetia.   Thanks.

## 2019-06-25 NOTE — Telephone Encounter (Signed)
Patient advised as below. Patient states she also has a prescription at home  for Atorvastatin. Should she be taking all 3 medications (Atorvastatin, Rosuvastatin and Zetia)?

## 2019-06-25 NOTE — Telephone Encounter (Signed)
Patient advised and agrees with treatment plan. Follow up appointment scheduled 08/06/2019.

## 2019-06-25 NOTE — Telephone Encounter (Signed)
Patient advised and verbalized understanding 

## 2019-06-26 MED ORDER — VALSARTAN-HYDROCHLOROTHIAZIDE 80-12.5 MG PO TABS: 1.0000 | ORAL_TABLET | Freq: Every day | ORAL | 1 refills | Status: DC

## 2019-06-26 NOTE — Telephone Encounter (Signed)
It looks like patient has not been taking this consistently as it has not been refilled since 2019. She should be on, but I sent in prescription for lower dose.

## 2019-07-09 ENCOUNTER — Other Ambulatory Visit: Payer: Self-pay | Admitting: Family Medicine

## 2019-07-15 ENCOUNTER — Other Ambulatory Visit: Payer: Self-pay | Admitting: Family Medicine

## 2019-07-16 NOTE — Telephone Encounter (Signed)
Requested medication (s) are due for refill today: yes  Requested medication (s) are on the active medication list: yes  Last refill: 05/20/2019  Future visit scheduled:yes  Notes to clinic: this refill cannot be delegated    Requested Prescriptions  Pending Prescriptions Disp Refills   butalbital-acetaminophen-caffeine (FIORICET) 50-325-40 MG tablet [Pharmacy Med Name: BUTALB-ACETAMIN-CAFF 50-325-40] 30 tablet 3    Sig: TAKE 1 TABLET BY MOUTH EVERY 6 (SIX) HOURS AS NEEDED FOR HEADACHE.      Not Delegated - Analgesics:  Non-Opioid Analgesic Combinations Failed - 07/15/2019  8:42 PM      Failed - This refill cannot be delegated      Failed - Valid encounter within last 12 months    Recent Outpatient Visits           4 weeks ago Medicare annual wellness visit, subsequent   [facility] [name], MD   1 year ago Cellulitis of other specified site   [facility] [name], MD   1 year ago Acute frontal sinusitis, recurrence not specified   [facility] [name], MD   1 year ago [place], Donald E, MD   1 year ago [place], [place]       Future Appointments             In 3 weeks Caryn Section, Kirstie Peri, MD [facility], [place]

## 2019-07-21 DIAGNOSIS — G4700 Insomnia, unspecified: Secondary | ICD-10-CM | POA: Diagnosis not present

## 2019-07-21 DIAGNOSIS — F331 Major depressive disorder, recurrent, moderate: Secondary | ICD-10-CM | POA: Diagnosis not present

## 2019-07-21 DIAGNOSIS — F411 Generalized anxiety disorder: Secondary | ICD-10-CM | POA: Diagnosis not present

## 2019-07-28 DIAGNOSIS — F411 Generalized anxiety disorder: Secondary | ICD-10-CM | POA: Diagnosis not present

## 2019-07-28 DIAGNOSIS — F331 Major depressive disorder, recurrent, moderate: Secondary | ICD-10-CM | POA: Diagnosis not present

## 2019-07-31 ENCOUNTER — Other Ambulatory Visit: Payer: Self-pay | Admitting: Family Medicine

## 2019-07-31 DIAGNOSIS — IMO0002 Reserved for concepts with insufficient information to code with codable children: Secondary | ICD-10-CM

## 2019-07-31 DIAGNOSIS — E1140 Type 2 diabetes mellitus with diabetic neuropathy, unspecified: Secondary | ICD-10-CM

## 2019-07-31 NOTE — Telephone Encounter (Signed)
Notes to clinic:  This Rx has expired.    Requested Prescriptions  Pending Prescriptions Disp Refills   gabapentin (NEURONTIN) 300 MG capsule [Pharmacy Med Name: GABAPENTIN 300 MG CAPSULE] 360 capsule 4    Sig: TAKE 1-2 CAPSULES BY MOUTH 2 TIMES A DAY      Neurology: Anticonvulsants - gabapentin Failed - 07/31/2019  1:36 AM      Failed - Valid encounter within last 12 months    Recent Outpatient Visits           1 month ago Medicare annual wellness visit, subsequent   [facility] [name], MD   1 year ago Cellulitis of other specified site   [facility] [name], MD   1 year ago Acute frontal sinusitis, recurrence not specified   [facility] [name], MD   1 year ago [place], Donald E, MD   1 year ago [place], [place]       Future Appointments             In 1 month [place], Kirstie Peri, MD [facility], [place]

## 2019-08-03 ENCOUNTER — Ambulatory Visit: Payer: PPO | Attending: Internal Medicine

## 2019-08-03 DIAGNOSIS — Z23 Encounter for immunization: Secondary | ICD-10-CM | POA: Insufficient documentation

## 2019-08-03 NOTE — Progress Notes (Signed)
   Covid-19 Vaccination Clinic  Name:  Katie Buckley    MRN: [id number] DOB: 09-11-1950  08/03/2019  [name] was observed post Covid-19 immunization for 15 minutes without incidence. She was provided with Vaccine Information Sheet and instruction to access the V-Safe system.   [name] was instructed to call 911 with any severe reactions post vaccine: Marland Kitchen Difficulty breathing  . Swelling of your face and throat  . A fast heartbeat  . A bad rash all over your body  . Dizziness and weakness    Immunizations Administered    Name Date Dose VIS Date Route   Pfizer COVID-19 Vaccine 08/03/2019 12:40 PM 0.3 mL 05/23/2019 Intramuscular   Manufacturer: [place]   Lot: [id number]   [place]: [id number]

## 2019-08-06 ENCOUNTER — Ambulatory Visit: Payer: Self-pay | Admitting: Family Medicine

## 2019-08-16 DIAGNOSIS — F331 Major depressive disorder, recurrent, moderate: Secondary | ICD-10-CM | POA: Diagnosis not present

## 2019-08-16 DIAGNOSIS — F411 Generalized anxiety disorder: Secondary | ICD-10-CM | POA: Diagnosis not present

## 2019-08-25 ENCOUNTER — Other Ambulatory Visit: Payer: Self-pay | Admitting: Family Medicine

## 2019-08-25 NOTE — Telephone Encounter (Signed)
Requested Prescriptions  Pending Prescriptions Disp Refills  . metoprolol-hydrochlorothiazide (LOPRESSOR HCT) 100-25 MG tablet [Pharmacy Med Name: METOPROLOL-HCTZ 100-25 MG TAB] 15 tablet     Sig: TAKE 1/2 TABLET BY MOUTH EVERY DAY     Cardiovascular: Beta Blocker + Diuretic Combos Failed - 08/25/2019  1:31 AM      Failed - Last BP in normal range    BP Readings from Last 1 Encounters:  06/17/19 (!) 154/62         Failed - Valid encounter within last 6 months    Recent Outpatient Visits          2 months ago Medicare annual wellness visit, subsequent   [facility] [name], MD   1 year ago Cellulitis of other specified site   [facility] [name], MD   1 year ago Acute frontal sinusitis, recurrence not specified   [facility] [name], MD   1 year ago [place], Donald E, MD   1 year ago [place], [place]      Future Appointments            In 2 weeks Caryn Section, Kirstie Peri, MD [facility], [facility]           Passed - K in normal range and within 180 days    Potassium  Date Value Ref Range Status  06/17/2019 4.4 3.5 - 5.2 mmol/L Final  02/08/2014 3.7 3.5 - 5.1 mmol/L Final         Passed - Na in normal range and within 180 days    Sodium  Date Value Ref Range Status  06/17/2019 135 134 - 144 mmol/L Final  02/08/2014 138 136 - 145 mmol/L Final         Passed - Cr in normal range and within 180 days    Creat  Date Value Ref Range Status  05/01/2017 0.61 0.50 - 0.99 mg/dL Final    Comment:    For patients >71 years of age, the reference limit for Creatinine is approximately 13% higher for people identified as African-American. .    Creatinine, Ser  Date Value Ref Range Status  06/17/2019 0.62 0.57 - 1.00 mg/dL Final         Passed - Ca in normal range and within 180 days    Calcium  Date Value Ref Range Status   06/17/2019 9.1 8.7 - 10.3 mg/dL Final   Calcium, Total  Date Value Ref Range Status  02/08/2014 9.0 8.5 - 10.1 mg/dL Final         Passed - Patient is not pregnant      Passed - Last Heart Rate in normal range    Pulse Readings from Last 1 Encounters:  06/17/19 91

## 2019-08-26 ENCOUNTER — Other Ambulatory Visit: Payer: Self-pay | Admitting: Family Medicine

## 2019-08-26 NOTE — Telephone Encounter (Signed)
Requested Prescriptions  Pending Prescriptions Disp Refills  . metFORMIN (GLUCOPHAGE-XR) 500 MG 24 hr tablet [Pharmacy Med Name: METFORMIN HCL ER 500 MG TABLET] 90 tablet 0    Sig: TAKE 1 TABLET BY MOUTH EVERY DAY WITH BREAKFAST     Endocrinology:  Diabetes - Biguanides Failed - 08/26/2019  1:28 AM      Failed - HBA1C is between 0 and 7.9 and within 180 days    Hemoglobin A1C  Date Value Ref Range Status  07/13/2016 8.5  Final   Hgb A1c MFr Bld  Date Value Ref Range Status  06/17/2019 11.8 (H) 4.8 - 5.6 % Final    Comment:             Prediabetes: 5.7 - 6.4          Diabetes: >6.4          Glycemic control for adults with diabetes: <7.0          Failed - Valid encounter within last 6 months    Recent Outpatient Visits          2 months ago Medicare annual wellness visit, subsequent   [facility] [name], MD   1 year ago Cellulitis of other specified site   [facility] [name], MD   1 year ago Acute frontal sinusitis, recurrence not specified   [facility] [name], MD   1 year ago [place], Donald E, MD   1 year ago [place], [place], [place]      Future Appointments            In 2 weeks Caryn Section, Kirstie Peri, MD [facility], [facility]           Passed - Cr in normal range and within 360 days    Creat  Date Value Ref Range Status  05/01/2017 0.61 0.50 - 0.99 mg/dL Final    Comment:    For patients >24 years of age, the reference limit for Creatinine is approximately 13% higher for people identified as African-American. .    Creatinine, Ser  Date Value Ref Range Status  06/17/2019 0.62 0.57 - 1.00 mg/dL Final         Passed - eGFR in normal range and within 360 days    GFR, Est African American  Date Value Ref Range Status  05/01/2017 110 > OR = 60 mL/min/1.61m Final   GFR calc Af Amer  Date Value Ref Range  Status  06/17/2019 108 >59 mL/min/1.73 Final   GFR, Est Non African American  Date Value Ref Range Status  05/01/2017 95 > OR = 60 mL/min/1.718mFinal   GFR calc non Af Amer  Date Value Ref Range Status  06/17/2019 94 >59 mL/min/1.73 Final         Patient was seen in the office in January 2021

## 2019-08-27 ENCOUNTER — Ambulatory Visit: Payer: PPO | Attending: Internal Medicine

## 2019-08-27 DIAGNOSIS — Z23 Encounter for immunization: Secondary | ICD-10-CM

## 2019-08-27 NOTE — Progress Notes (Signed)
   Covid-19 Vaccination Clinic  Name:  Katie Buckley    MRN: [id number] DOB: 1950-10-12  08/27/2019  [name] was observed post Covid-19 immunization for 15 minutes without incident. She was provided with Vaccine Information Sheet and instruction to access the V-Safe system.   [name] was instructed to call 911 with any severe reactions post vaccine: Marland Kitchen Difficulty breathing  . Swelling of face and throat  . A fast heartbeat  . A bad rash all over body  . Dizziness and weakness   Immunizations Administered    Name Date Dose VIS Date Route   Pfizer COVID-19 Vaccine 08/27/2019  8:16 AM 0.3 mL 05/23/2019 Intramuscular   Manufacturer: [place]   Lot: [id number]   [place]: [id number]

## 2019-08-30 DIAGNOSIS — F331 Major depressive disorder, recurrent, moderate: Secondary | ICD-10-CM | POA: Diagnosis not present

## 2019-08-30 DIAGNOSIS — F411 Generalized anxiety disorder: Secondary | ICD-10-CM | POA: Diagnosis not present

## 2019-09-09 ENCOUNTER — Ambulatory Visit: Payer: Self-pay | Admitting: Family Medicine

## 2019-09-11 ENCOUNTER — Other Ambulatory Visit: Payer: Self-pay | Admitting: Family Medicine

## 2019-09-19 ENCOUNTER — Telehealth: Payer: Self-pay

## 2019-09-19 NOTE — Telephone Encounter (Signed)
Copied from [place] [id number]. Topic: General - Other >> Sep 19, 2019  1:47 PM Wynetta Emery, [place] C wrote: Reason for CRM: pt called to have a order placed to have her thyroid checked. Pt says that she was recommended to have it checked.

## 2019-09-19 NOTE — Telephone Encounter (Signed)
Please review chart and advise, I dont see looking through past labs that she had TSH checked back in 2016 . KW

## 2019-09-20 NOTE — Telephone Encounter (Signed)
She is overdue for follow up of uncontrolled diabetes. Can discuss at her office visit.

## 2019-09-22 NOTE — Telephone Encounter (Signed)
Left patient a message advising her as stated below.

## 2019-09-25 ENCOUNTER — Ambulatory Visit (INDEPENDENT_AMBULATORY_CARE_PROVIDER_SITE_OTHER): Payer: PPO | Admitting: Physician Assistant

## 2019-09-25 ENCOUNTER — Ambulatory Visit: Payer: Self-pay | Admitting: Family Medicine

## 2019-09-25 ENCOUNTER — Encounter: Payer: Self-pay | Admitting: Physician Assistant

## 2019-09-25 VITALS — Temp 97.6°F

## 2019-09-25 DIAGNOSIS — R110 Nausea: Secondary | ICD-10-CM | POA: Diagnosis not present

## 2019-09-25 DIAGNOSIS — R197 Diarrhea, unspecified: Secondary | ICD-10-CM | POA: Diagnosis not present

## 2019-09-25 MED ORDER — ONDANSETRON HCL 4 MG PO TABS: 4.0000 mg | ORAL_TABLET | Freq: Three times a day (TID) | ORAL | 0 refills | Status: DC | PRN

## 2019-09-25 MED ORDER — CIPROFLOXACIN HCL 250 MG PO TABS: 250.0000 mg | ORAL_TABLET | Freq: Two times a day (BID) | ORAL | 0 refills | Status: DC

## 2019-09-25 NOTE — Patient Instructions (Signed)
Diarrhea, Adult Diarrhea is frequent loose and watery bowel movements. Diarrhea can make you feel weak and cause you to become dehydrated. Dehydration can make you tired and thirsty, cause you to have a dry mouth, and decrease how often you urinate. Diarrhea typically lasts 2-3 days. However, it can last longer if it is a sign of something more serious. It is important to treat your diarrhea as told by your health care provider. Follow these instructions at home: Eating and drinking     Follow these recommendations as told by your health care provider:  Take an oral rehydration solution (ORS). This is an over-the-counter medicine that helps return your body to its normal balance of nutrients and water. It is found at pharmacies and retail stores.  Drink plenty of fluids, such as water, ice chips, diluted fruit juice, and low-calorie sports drinks. You can drink milk also, if desired.  Avoid drinking fluids that contain a lot of sugar or caffeine, such as energy drinks, sports drinks, and soda.  Eat bland, easy-to-digest foods in small amounts as you are able. These foods include bananas, applesauce, rice, lean meats, toast, and crackers.  Avoid alcohol.  Avoid spicy or fatty foods.  Medicines  Take over-the-counter and prescription medicines only as told by your health care provider.  If you were prescribed an antibiotic medicine, take it as told by your health care provider. Do not stop using the antibiotic even if you start to feel better. General instructions   Wash your hands often using soap and water. If soap and water are not available, use a hand sanitizer. Others in the household should wash their hands as well. Hands should be washed: ? After using the toilet or changing a diaper. ? Before preparing, cooking, or serving food. ? While caring for a sick person or while visiting someone in a hospital.  Drink enough fluid to keep your urine pale yellow.  Rest at home while  you recover.  Watch your condition for any changes.  Take a warm bath to relieve any burning or pain from frequent diarrhea episodes.  Keep all follow-up visits as told by your health care provider. This is important. Contact a health care provider if:  You have a fever.  Your diarrhea gets worse.  You have new symptoms.  You cannot keep fluids down.  You feel light-headed or dizzy.  You have a headache.  You have muscle cramps. Get help right away if:  You have chest pain.  You feel extremely weak or you faint.  You have bloody or black stools or stools that look like tar.  You have severe pain, cramping, or bloating in your abdomen.  You have trouble breathing or you are breathing very quickly.  Your heart is beating very quickly.  Your skin feels cold and clammy.  You feel confused.  You have signs of dehydration, such as: ? Dark urine, very little urine, or no urine. ? Cracked lips. ? Dry mouth. ? Sunken eyes. ? Sleepiness. ? Weakness. Summary  Diarrhea is frequent loose and watery bowel movements. Diarrhea can make you feel weak and cause you to become dehydrated.  Drink enough fluids to keep your urine pale yellow.  Make sure that you wash your hands after using the toilet. If soap and water are not available, use hand sanitizer.  Contact a health care provider if your diarrhea gets worse or you have new symptoms.  Get help right away if you have signs of dehydration. This   information is not intended to replace advice given to you by your health care provider. Make sure you discuss any questions you have with your health care provider. Document Revised: 10/15/2018 Document Reviewed: 11/02/2017 Elsevier Patient Education  2020 [place] Choices to Help Relieve Diarrhea, Adult When you have diarrhea, the foods you eat and your eating habits are very important. Choosing the right foods and drinks can help:  Relieve diarrhea.  Replace  lost fluids and nutrients.  Prevent dehydration. What general guidelines should I follow?  Relieving diarrhea  Choose foods with less than 2 g or .07 oz. of fiber per serving.  Limit fats to less than 8 tsp (38 g or 1.34 oz.) a day.  Avoid the following: ? Foods and beverages sweetened with high-fructose corn syrup, honey, or sugar alcohols such as xylitol, sorbitol, and mannitol. ? Foods that contain a lot of fat or sugar. ? Fried, greasy, or spicy foods. ? High-fiber grains, breads, and cereals. ? Raw fruits and vegetables.  Eat foods that are rich in probiotics. These foods include dairy products such as yogurt and fermented milk products. They help increase healthy bacteria in the stomach and intestines (gastrointestinal tract, or GI tract).  If you have lactose intolerance, avoid dairy products. These may make your diarrhea worse.  Take medicine to help stop diarrhea (antidiarrheal medicine) only as told by your health care provider. Replacing nutrients  Eat small meals or snacks every 3-4 hours.  Eat bland foods, such as white rice, toast, or baked potato, until your diarrhea starts to get better. Gradually reintroduce nutrient-rich foods as tolerated or as told by your health care provider. This includes: ? Well-cooked protein foods. ? Peeled, seeded, and soft-cooked fruits and vegetables. ? Low-fat dairy products.  Take vitamin and mineral supplements as told by your health care provider. Preventing dehydration  Start by sipping water or a special solution to prevent dehydration (oral rehydration solution, ORS). Urine that is clear or pale yellow means that you are getting enough fluid.  Try to drink at least 8-10 cups of fluid each day to help replace lost fluids.  You may add other liquids in addition to water, such as clear juice or decaffeinated sports drinks, as tolerated or as told by your health care provider.  Avoid drinks with caffeine, such as coffee, tea,  or soft drinks.  Avoid alcohol. What foods are recommended?     The items listed may not be a complete list. Talk with your health care provider about what dietary choices are best for you. Grains White rice. White, [place], or pita breads (fresh or toasted), including plain rolls, buns, or bagels. White pasta. Saltine, soda, or graham crackers. Pretzels. Low-fiber cereal. Cooked cereals made with water (such as cornmeal, farina, or cream cereals). Plain muffins. Matzo. Melba toast. Zwieback. Vegetables Potatoes (without the skin). Most well-cooked and canned vegetables without skins or seeds. Tender lettuce. Fruits Apple sauce. Fruits canned in juice. Cooked apricots, cherries, grapefruit, peaches, pears, or plums. Fresh bananas and cantaloupe. Meats and other protein foods Baked or boiled chicken. Eggs. Tofu. Fish. Seafood. Smooth nut butters. Ground or well-cooked tender beef, ham, veal, lamb, pork, or poultry. Dairy Plain yogurt, kefir, and unsweetened liquid yogurt. Lactose-free milk, buttermilk, skim milk, or soy milk. Low-fat or nonfat hard cheese. Beverages Water. Low-calorie sports drinks. Fruit juices without pulp. Strained tomato and vegetable juices. Decaffeinated teas. Sugar-free beverages not sweetened with sugar alcohols. Oral rehydration solutions, if approved by your health  care provider. Seasoning and other foods Bouillon, broth, or soups made from recommended foods. What foods are not recommended? The items listed may not be a complete list. Talk with your health care provider about what dietary choices are best for you. Grains Whole grain, whole wheat, bran, or rye breads, rolls, pastas, and crackers. Wild or brown rice. Whole grain or bran cereals. Barley. Oats and oatmeal. Corn tortillas or taco shells. Granola. Popcorn. Vegetables Raw vegetables. Fried vegetables. Cabbage, broccoli, Brussels sprouts, artichokes, baked beans, beet greens, corn, kale, legumes, peas,  sweet potatoes, and yams. Potato skins. Cooked spinach and cabbage. Fruits Dried fruit, including raisins and dates. Raw fruits. Stewed or dried prunes. Canned fruits with syrup. Meat and other protein foods Fried or fatty meats. Deli meats. Chunky nut butters. Nuts and seeds. Beans and lentils. Berniece Salines. Hot dogs. Sausage. Dairy High-fat cheeses. Whole milk, chocolate milk, and beverages made with milk, such as milk shakes. Half-and-half. Cream. sour cream. Ice cream. Beverages Caffeinated beverages (such as coffee, tea, soda, or energy drinks). Alcoholic beverages. Fruit juices with pulp. Prune juice. Soft drinks sweetened with high-fructose corn syrup or sugar alcohols. High-calorie sports drinks. Fats and oils Butter. Cream sauces. Margarine. Salad oils. Plain salad dressings. Olives. Avocados. Mayonnaise. Sweets and desserts Sweet rolls, doughnuts, and sweet breads. Sugar-free desserts sweetened with sugar alcohols such as xylitol and sorbitol. Seasoning and other foods Honey. Hot sauce. Chili powder. Gravy. Cream-based or milk-based soups. Pancakes and waffles. Summary  When you have diarrhea, the foods you eat and your eating habits are very important.  Make sure you get at least 8-10 cups of fluid each day, or enough to keep your urine clear or pale yellow.  Eat bland foods and gradually reintroduce healthy, nutrient-rich foods as tolerated, or as told by your health care provider.  Avoid high-fiber, fried, greasy, or spicy foods. This information is not intended to replace advice given to you by your health care provider. Make sure you discuss any questions you have with your health care provider. Document Revised: 09/19/2018 Document Reviewed: 05/26/2016 Elsevier Patient Education  [place].

## 2019-09-25 NOTE — Telephone Encounter (Signed)
Patient called stating that since Monday She has had cramping to her lower abdomin. She states that it came on sudden. She states that her stomach has been bloated but is better today.  She is eating very little because food make her nauseated.  She has not vomited. She has had loose BM's Diarrhea at least 2-3 per day.  She has chills but no fever. She states the cramping pain does not wake her from sleep but it keeps her from eating. She has been fully vaccinated for COVID-19.  She states that she is under a lot of stress.  She states that she belches a lot and it leaves a weird taste in her mouth.DT completed Virtual visit scheduled for today.   Reason for Disposition . [1] Vomiting AND [2] abdomen looks much more swollen than usual  Answer Assessment - Initial Assessment Questions 1. LOCATION: "Where does it hurt?"      Cramping all over motly lower 2. RADIATION: "Does the pain shoot anywhere else?" (e.g., chest, back)    no 3. ONSET: "When did the pain begin?" (e.g., minutes, hours or days ago)      Monday 4. SUDDEN: "Gradual or sudden onset?"    Sudden 5. PATTERN "Does the pain come and go, or is it constant?"    - If constant: "Is it getting better, staying the same, or worsening?"      (Note: Constant means the pain never goes away completely; most serious pain is constant and it progresses)     - If intermittent: "How long does it last?" "Do you have pain now?"     (Note: Intermittent means the pain goes away completely between bouts)     constant 6. SEVERITY: "How bad is the pain?"  (e.g., Scale 1-10; mild, moderate, or severe)   - MILD (1-3): doesn't interfere with normal activities, abdomen soft and not tender to touch    - MODERATE (4-7): interferes with normal activities or awakens from sleep, tender to touch    - SEVERE (8-10): excruciating pain, doubled over, unable to do any normal activities      4-7 7. RECURRENT SYMPTOM: "Have you ever had this type of abdominal pain  before?" If so, ask: "When was the last time?" and "What happened that time?"      No 8. CAUSE: "What do you think is causing the abdominal pain?"     unsure 9. RELIEVING/AGGRAVATING FACTORS: "What makes it better or worse?" (e.g., movement, antacids, bowel movement)     Eating make worse took imodium 10. OTHER SYMPTOMS: "Has there been any vomiting, diarrhea, constipation, or urine problems?"     diarrhea 11. PREGNANCY: "Is there any chance you are pregnant?" "When was your last menstrual period?"      N/A  Protocols used: ABDOMINAL PAIN - [facility]

## 2019-09-25 NOTE — Progress Notes (Signed)
Established patient visit     Patient: Katie Buckley   DOB: 1950-12-20   69 y.o. Female  MRN: [id number] Visit Date: 09/25/2019  Today's healthcare provider: Mar Daring, PA-C  Subjective:    Chief Complaint  Patient presents with  . Diarrhea  Virtual Visit via Telephone Note  I connected with Katie Buckley on 09/25/19 at  3:20 PM EDT by telephone and verified that I am speaking with the correct person using two identifiers.  Location: Patient: Home Provider: [facility]   I discussed the limitations, risks, security and privacy concerns of performing an evaluation and management service by telephone and the availability of in person appointments. I also discussed with the patient that there may be a patient responsible charge related to this service. The patient expressed understanding and agreed to proceed.   Diarrhea  This is a new problem. The current episode started in the past 7 days. The problem has been gradually improving. Diarrhea characteristics: resolved with the Imodium. Associated symptoms include abdominal pain, bloating, chills, headaches and increased flatus. Pertinent negatives include no fever or vomiting. Associated symptoms comments: Nausea, lots of burping-awful taste in her mouth like "bad eggs". Nothing aggravates the symptoms. She has tried increased fluids (Imodium) for the symptoms.  Symptoms started on Monday with belching and bad taste and a lot of gas. The progressed to more nausea and diarrhea. Has been having diarrhea after every time she eats. Imodium helped. Stools have been loose, watery and some with small stools. Denies hematochezia or melena. No vomiting. No fever. Has had chills. Had to travel last week for a family funeral. Was eating out more.   Patient Active Problem List   Diagnosis Date Noted  . MRSA cellulitis 04/19/2018  . Decreased libido 02/04/2018  . Uncontrolled type 2 diabetes mellitus with diabetic neuropathy, without  long-term current use of insulin ([place]) 04/20/2017  . H/O adenomatous polyp of colon 08/20/2015  . Rupture of implant of right breast 05/18/2015  . Anxiety disorder 04/26/2015  . Allergic rhinitis 04/21/2015  . Anemia 04/21/2015  . Asthma 04/21/2015  . 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 Chronic infection of sinus 04/21/2015  . Depression 04/21/2015  . GERD (gastroesophageal reflux disease) 04/21/2015  . Headache 04/21/2015  . Menopausal symptoms 04/21/2015  . Obesity 04/21/2015  . Palpitations 04/21/2015  . Vitamin B12 deficiency 04/21/2015  . Aortic stenosis 01/21/2013  . Diastolic dysfunction 123XX123  . Diabetes mellitus out of control ([place]) 06/12/2006  . Barrett esophagus 06/13/2003  . Essential hypertension 06/12/1998  . Genital herpes 06/12/1998  . Hyperlipidemia, mixed 06/12/1998   Past Medical History:  Diagnosis Date  . Anxiety   . Depression   . Diabetes mellitus without complication ([place])   . Frequent sinus infections   . GERD (gastroesophageal reflux disease) 04/21/2015   History of Barrett's esophagus   . Heart murmur   . Hyperchloremia   . Hypertension   . MRSA (methicillin resistant staph aureus) culture positive 2018   history of, in hand  . Neuromuscular disorder (HCC)    neuropathy  . Palpitations   . Seasonal allergies   . Skin cancer    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       Medications: Outpatient Medications Prior to Visit  Medication Sig  . ARIPiprazole (ABILIFY) 5 MG tablet Take 5 mg by mouth daily.  . Biotin 10 MG CAPS Take 10 mg by mouth daily after lunch.  Marland Kitchen buPROPion [facility] SR)  150 MG 12 hr tablet Take 150 mg by mouth 2 (two) times daily. Morning & 3pm  . butalbital-acetaminophen-caffeine (FIORICET) 50-325-40 MG tablet TAKE 1 TABLET BY MOUTH EVERY 6 (SIX) HOURS AS NEEDED FOR HEADACHE.  Marland Kitchen Cholecalciferol (VITAMIN D3 PO) Take 1 tablet by mouth daily with lunch.  . esomeprazole (NEXIUM) 20 MG capsule Take 40 mg by mouth  daily before breakfast.   . ezetimibe (ZETIA) 10 MG tablet TAKE 1 TABLET BY MOUTH EVERYDAY AT BEDTIME  . fexofenadine (ALLEGRA) 180 MG tablet Take 180 mg by mouth daily as needed for allergies or rhinitis.  . fluticasone (FLONASE) 50 MCG/ACT nasal spray Place 2 sprays into both nostrils daily as needed for allergies.  Marland Kitchen gabapentin (NEURONTIN) 300 MG capsule Take 2 capsules (600 mg total) by mouth 2 (two) times daily. Morning & supper  . glipiZIDE (GLUCOTROL) 10 MG tablet Take 1 tablet (10 mg total) by mouth daily before breakfast.  . hydrOXYzine (ATARAX/VISTARIL) 50 MG tablet Take 50 mg by mouth 2 (two) times daily. Morning & 3pm  . JARDIANCE 10 MG TABS tablet TAKE 10 MG BY MOUTH DAILY.  Marland Kitchen ketotifen (ZADITOR) 0.025 % ophthalmic solution Place 1 drop into both eyes 2 (two) times daily as needed (dry/irritated eyes.).  Marland Kitchen Lancet Devices MISC   . meloxicam (MOBIC) 15 MG tablet Take 1 tablet (15 mg total) by mouth daily. (Patient taking differently: Take 15 mg by mouth daily as needed (pain.). )  . metFORMIN (GLUCOPHAGE-XR) 500 MG 24 hr tablet TAKE 1 TABLET BY MOUTH EVERY DAY WITH BREAKFAST  . metoprolol-hydrochlorothiazide (LOPRESSOR HCT) 100-25 MG tablet TAKE 1/2 TABLET BY MOUTH EVERY DAY  . montelukast (SINGULAIR) 10 MG tablet TAKE 1 TABLET BY MOUTH EVERY DAY (Patient taking differently: Take 10 mg by mouth daily as needed (allergies.). )  . rosuvastatin (CRESTOR) 20 MG tablet Take 1 tablet (20 mg total) by mouth daily.  . sertraline (ZOLOFT) 100 MG tablet Take 200 mg by mouth daily.   . traZODone (DESYREL) 50 MG tablet Take 50-100 mg by mouth at bedtime as needed for sleep.   . TRULICITY 1.5 0000000 SOPN INJECT 1.5 MG INTO THE SKIN ONCE A WEEK.  . valACYclovir (VALTREX) 1000 MG tablet Take 1 tablet daily for 5 days as needed  . valsartan-hydrochlorothiazide (DIOVAN HCT) 80-12.5 MG tablet Take 1 tablet by mouth daily.  . metoprolol succinate (TOPROL-XL) 25 MG 24 hr tablet TAKE 1 TABLET BY MOUTH  EVERY DAY   No facility-administered medications prior to visit.    Review of Systems  Constitutional: Positive for chills and fatigue. Negative for fever.  HENT: Positive for rhinorrhea.   Respiratory: Negative.   Cardiovascular: Negative.   Gastrointestinal: Positive for abdominal distention, abdominal pain, bloating, diarrhea, flatus and nausea. Negative for anal bleeding, blood in stool, constipation, rectal pain and vomiting.  Genitourinary: Negative.   Musculoskeletal: Positive for back pain (Lower back pain-started today).  Neurological: Positive for headaches.    Last CBC Lab Results  Component Value Date   WBC 6.3 06/17/2019   HGB 11.9 06/17/2019   HCT 36.0 06/17/2019   MCV 80 06/17/2019   MCH 26.4 (L) 06/17/2019   RDW 13.5 06/17/2019   PLT 232 [id number]   Last metabolic panel Lab Results  Component Value Date   GLUCOSE 400 (H) 06/17/2019   NA 135 06/17/2019   K 4.4 06/17/2019   CL 98 06/17/2019   CO2 24 06/17/2019   BUN 10 06/17/2019   CREATININE 0.62 06/17/2019  GFRNONAA 94 06/17/2019   GFRAA 108 06/17/2019   CALCIUM 9.1 06/17/2019   PHOS 4.4 12/26/2017   PROT 5.9 (L) 06/17/2019   ALBUMIN 4.0 06/17/2019   LABGLOB 1.9 06/17/2019   AGRATIO 2.1 06/17/2019   BILITOT <0.2 06/17/2019   ALKPHOS 131 (H) 06/17/2019   AST 16 06/17/2019   ALT 10 06/17/2019   ANIONGAP 12 09/22/2017   Last hemoglobin A1c Lab Results  Component Value Date   HGBA1C 11.8 (H) 06/17/2019        Objective:    Temp 97.6 F (36.4 C) (Temporal)  BP Readings from Last 3 Encounters:  06/17/19 (!) 154/62  02/06/19 (!) 120/51  01/16/19 (!) 107/53   Wt Readings from Last 3 Encounters:  06/17/19 147 lb (66.7 kg)  01/22/19 153 lb 15.9 oz (69.8 kg)  01/16/19 154 lb 5.2 oz (70 kg)      Physical Exam Vitals reviewed.  Constitutional:      General: She is not in acute distress. Pulmonary:     Effort: No respiratory distress.  Neurological:     Mental Status: She is alert.    Psychiatric:        Mood and Affect: Mood normal.        Thought Content: Thought content normal.       No results found for any visits on 09/25/19.    Assessment & Plan:     1. Diarrhea of presumed infectious origin Suspect possible food borne infection. Push fluids for rehydration. May continue to use Imodium prn.  Will treat with Cipro as below. Advised to call if symptoms are not improving or worsening.  - ciprofloxacin (CIPRO) 250 MG tablet; Take 1 tablet (250 mg total) by mouth 2 (two) times daily.  Dispense: 10 tablet; Refill: 0  2. Nausea Zofran for nausea to help increase intake.  - ondansetron (ZOFRAN) 4 MG tablet; Take 1 tablet (4 mg total) by mouth every 8 (eight) hours as needed.  Dispense: 20 tablet; Refill: 0   I discussed the assessment and treatment plan with the patient. The patient was provided an opportunity to ask questions and all were answered. The patient agreed with the plan and demonstrated an understanding of the instructions.   The patient was advised to call back or seek an in-person evaluation if the symptoms worsen or if the condition fails to improve as anticipated.  I provided 14 minutes of non-face-to-face time during this encounter.  No follow-ups on file.     Reynolds Bowl, PA-C, have reviewed all documentation for this visit. The documentation on 09/25/19 for the exam, diagnosis, procedures, and orders are all accurate and complete.   Rubye Beach  [facility] [phone] (phone) [phone] (fax)  [place]

## 2019-10-02 ENCOUNTER — Other Ambulatory Visit: Payer: Self-pay | Admitting: Family Medicine

## 2019-10-02 DIAGNOSIS — IMO0002 Reserved for concepts with insufficient information to code with codable children: Secondary | ICD-10-CM

## 2019-10-02 DIAGNOSIS — E1140 Type 2 diabetes mellitus with diabetic neuropathy, unspecified: Secondary | ICD-10-CM

## 2019-10-10 ENCOUNTER — Encounter: Payer: Self-pay | Admitting: Physician Assistant

## 2019-10-10 ENCOUNTER — Other Ambulatory Visit: Payer: Self-pay

## 2019-10-10 ENCOUNTER — Ambulatory Visit: Payer: Self-pay | Admitting: Family Medicine

## 2019-10-10 ENCOUNTER — Ambulatory Visit (INDEPENDENT_AMBULATORY_CARE_PROVIDER_SITE_OTHER): Payer: PPO | Admitting: Physician Assistant

## 2019-10-10 VITALS — BP 157/77 | HR 94 | Temp 97.2°F | Resp 16 | Wt 142.2 lb

## 2019-10-10 DIAGNOSIS — L0390 Cellulitis, unspecified: Secondary | ICD-10-CM

## 2019-10-10 MED ORDER — SULFAMETHOXAZOLE-TRIMETHOPRIM 400-80 MG PO TABS: 1.0000 | ORAL_TABLET | Freq: Two times a day (BID) | ORAL | 0 refills | Status: DC

## 2019-10-10 NOTE — Patient Instructions (Signed)
Skin Abscess  A skin abscess is an infected area on or under your skin that contains a collection of pus and other material. An abscess may also be called a furuncle, carbuncle, or boil. An abscess can occur in or on almost any part of your body. Some abscesses break open (rupture) on their own. Most continue to get worse unless they are treated. The infection can spread deeper into the body and eventually into your blood, which can make you feel ill. Treatment usually involves draining the abscess. What are the causes? An abscess occurs when germs, like bacteria, pass through your skin and cause an infection. This may be caused by:  A scrape or cut on your skin.  A puncture wound through your skin, including a needle injection or insect bite.  Blocked oil or sweat glands.  Blocked and infected hair follicles.  A cyst that forms beneath your skin (sebaceous cyst) and becomes infected. What increases the risk? This condition is more likely to develop in people who:  Have a weak body defense system (immune system).  Have diabetes.  Have dry and irritated skin.  Get frequent injections or use illegal IV drugs.  Have a foreign body in a wound, such as a splinter.  Have problems with their lymph system or veins. What are the signs or symptoms? Symptoms of this condition include:  A painful, firm bump under the skin.  A bump with pus at the top. This may break through the skin and drain. Other symptoms include:  Redness surrounding the abscess site.  Warmth.  Swelling of the lymph nodes (glands) near the abscess.  Tenderness.  A sore on the skin. How is this diagnosed? This condition may be diagnosed based on:  A physical exam.  Your medical history.  A sample of pus. This may be used to find out what is causing the infection.  Blood tests.  Imaging tests, such as an ultrasound, CT scan, or MRI. How is this treated? A small abscess that drains on its own may  not need treatment. Treatment for larger abscesses may include:  Moist heat or heat pack applied to the area several times a day.  A procedure to drain the abscess (incision and drainage).  Antibiotic medicines. For a severe abscess, you may first get antibiotics through an IV and then change to antibiotics by mouth. Follow these instructions at home: Medicines   Take over-the-counter and prescription medicines only as told by your health care provider.  If you were prescribed an antibiotic medicine, take it as told by your health care provider. Do not stop taking the antibiotic even if you start to feel better. Abscess care   If you have an abscess that has not drained, apply heat to the affected area. Use the heat source that your health care provider recommends, such as a moist heat pack or a heating pad. ? Place a towel between your skin and the heat source. ? Leave the heat on for 20-30 minutes. ? Remove the heat if your skin turns bright red. This is especially important if you are unable to feel pain, heat, or cold. You may have a greater risk of getting burned.  Follow instructions from your health care provider about how to take care of your abscess. Make sure you: ? Cover the abscess with a bandage (dressing). ? Change your dressing or gauze as told by your health care provider. ? Wash your hands with soap and water before you change the   dressing or gauze. If soap and water are not available, use hand sanitizer.  Check your abscess every day for signs of a worsening infection. Check for: ? More redness, swelling, or pain. ? More fluid or blood. ? Warmth. ? More pus or a bad smell. General instructions  To avoid spreading the infection: ? Do not share personal care items, towels, or hot tubs with others. ? Avoid making skin contact with other people.  Keep all follow-up visits as told by your health care provider. This is important. Contact a health care provider if  you have:  More redness, swelling, or pain around your abscess.  More fluid or blood coming from your abscess.  Warm skin around your abscess.  More pus or a bad smell coming from your abscess.  A fever.  Muscle aches.  Chills or a general ill feeling. Get help right away if you:  Have severe pain.  See red streaks on your skin spreading away from the abscess. Summary  A skin abscess is an infected area on or under your skin that contains a collection of pus and other material.  A small abscess that drains on its own may not need treatment.  Treatment for larger abscesses may include having a procedure to drain the abscess and taking an antibiotic. This information is not intended to replace advice given to you by your health care provider. Make sure you discuss any questions you have with your health care provider. Document Revised: 09/19/2018 Document Reviewed: 07/12/2017 Elsevier Patient Education  2020 Elsevier Inc.  

## 2019-10-10 NOTE — Progress Notes (Signed)
Established patient visit   Patient: Katie Buckley   DOB: May 23, 1951   69 y.o. Female  MRN: [id number] Visit Date: 10/10/2019  Today's healthcare provider: Mar Daring, PA-C   Chief Complaint  Patient presents with   Cyst   Subjective    HPI Patient here with c/o possible cyst. Reports it started last weekend with some itching on her right leg. Reports that is red and swollen.She reports that at first it looked like it had a little head with pus in it and she tried to squeezed a little bit and some pus come out but now is just bleeding and redness.She has been putting warm compresses and watching it off with dial and using polysporin. Aggravated with movement. Taken Tylenol for the pain.   Patient Active Problem List   Diagnosis Date Noted   MRSA cellulitis 04/19/2018   Decreased libido 02/04/2018   Uncontrolled type 2 diabetes mellitus with diabetic neuropathy, without long-term current use of insulin ([place]) 04/20/2017   H/O adenomatous polyp of colon 08/20/2015   Rupture of implant of right breast 05/18/2015   Anxiety disorder 04/26/2015   Allergic rhinitis 04/21/2015   Anemia 04/21/2015   Asthma 04/21/2015   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/21/2015   Chronic infection of sinus 04/21/2015   Depression 04/21/2015   GERD (gastroesophageal reflux disease) 04/21/2015   Headache 04/21/2015   Menopausal symptoms 04/21/2015   Obesity 04/21/2015   Palpitations 04/21/2015   Vitamin B12 deficiency 04/21/2015   Aortic stenosis 123XX123   Diastolic dysfunction 123XX123   Diabetes mellitus out of control ([place]) 06/12/2006   Barrett esophagus 06/13/2003   Essential hypertension 06/12/1998   Genital herpes 06/12/1998   Hyperlipidemia, mixed 06/12/1998   Past Medical History:  Diagnosis Date   Anxiety    Depression    Diabetes mellitus without complication (HCC)    Frequent sinus infections    GERD (gastroesophageal reflux disease)  04/21/2015   History of Barrett's esophagus    Heart murmur    Hyperchloremia    Hypertension    MRSA (methicillin resistant staph aureus) culture positive 2018   history of, in hand   Neuromuscular disorder (HCC)    neuropathy   Palpitations    Seasonal allergies    Skin cancer    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       Medications: Outpatient Medications Prior to Visit  Medication Sig   ARIPiprazole (ABILIFY) 5 MG tablet Take 5 mg by mouth daily.   Biotin 10 MG CAPS Take 10 mg by mouth daily after lunch.   buPROPion (WELLBUTRIN SR) 150 MG 12 hr tablet Take 150 mg by mouth 2 (two) times daily. Morning & 3pm   butalbital-acetaminophen-caffeine (FIORICET) 50-325-40 MG tablet TAKE 1 TABLET BY MOUTH EVERY 6 (SIX) HOURS AS NEEDED FOR HEADACHE.   Cholecalciferol (VITAMIN D3 PO) Take 1 tablet by mouth daily with lunch.   esomeprazole (NEXIUM) 20 MG capsule Take 40 mg by mouth daily before breakfast.    ezetimibe (ZETIA) 10 MG tablet TAKE 1 TABLET BY MOUTH EVERYDAY AT BEDTIME   fexofenadine (ALLEGRA) 180 MG tablet Take 180 mg by mouth daily as needed for allergies or rhinitis.   fluticasone (FLONASE) 50 MCG/ACT nasal spray Place 2 sprays into both nostrils daily as needed for allergies.   gabapentin (NEURONTIN) 300 MG capsule Take 2 capsules (600 mg total) by mouth 2 (two) times daily. Morning & supper   glipiZIDE (GLUCOTROL) 10 MG tablet Take 1  tablet (10 mg total) by mouth daily before breakfast.   hydrOXYzine (ATARAX/VISTARIL) 50 MG tablet Take 50 mg by mouth 2 (two) times daily. Morning & 3pm   JARDIANCE 10 MG TABS tablet TAKE 10 MG BY MOUTH DAILY.   ketotifen (ZADITOR) 0.025 % ophthalmic solution Place 1 drop into both eyes 2 (two) times daily as needed (dry/irritated eyes.).   Lancet Devices MISC    meloxicam (MOBIC) 15 MG tablet Take 1 tablet (15 mg total) by mouth daily. (Patient taking differently: Take 15 mg by mouth daily as needed (pain.). )    metFORMIN (GLUCOPHAGE-XR) 500 MG 24 hr tablet TAKE 1 TABLET BY MOUTH EVERY DAY WITH BREAKFAST   metoprolol-hydrochlorothiazide (LOPRESSOR HCT) 100-25 MG tablet TAKE 1/2 TABLET BY MOUTH EVERY DAY   rosuvastatin (CRESTOR) 20 MG tablet Take 1 tablet (20 mg total) by mouth daily.   sertraline (ZOLOFT) 100 MG tablet Take 200 mg by mouth daily.    traZODone (DESYREL) 50 MG tablet Take 50-100 mg by mouth at bedtime as needed for sleep.    TRULICITY 1.5 0000000 SOPN INJECT 1.5 MG INTO THE SKIN ONCE A WEEK.   valACYclovir (VALTREX) 1000 MG tablet Take 1 tablet daily for 5 days as needed   valsartan-hydrochlorothiazide (DIOVAN HCT) 80-12.5 MG tablet Take 1 tablet by mouth daily.   ciprofloxacin (CIPRO) 250 MG tablet Take 1 tablet (250 mg total) by mouth 2 (two) times daily.   metoprolol succinate (TOPROL-XL) 25 MG 24 hr tablet TAKE 1 TABLET BY MOUTH EVERY DAY   montelukast (SINGULAIR) 10 MG tablet TAKE 1 TABLET BY MOUTH EVERY DAY (Patient not taking: No sig reported)   ondansetron (ZOFRAN) 4 MG tablet Take 1 tablet (4 mg total) by mouth every 8 (eight) hours as needed. (Patient not taking: Reported on 10/10/2019)   No facility-administered medications prior to visit.    Review of Systems  Last CBC Lab Results  Component Value Date   WBC 6.3 06/17/2019   HGB 11.9 06/17/2019   HCT 36.0 06/17/2019   MCV 80 06/17/2019   MCH 26.4 (L) 06/17/2019   RDW 13.5 06/17/2019   PLT 232 [id number]   Last metabolic panel Lab Results  Component Value Date   GLUCOSE 400 (H) 06/17/2019   NA 135 06/17/2019   K 4.4 06/17/2019   CL 98 06/17/2019   CO2 24 06/17/2019   BUN 10 06/17/2019   CREATININE 0.62 06/17/2019   GFRNONAA 94 06/17/2019   GFRAA 108 06/17/2019   CALCIUM 9.1 06/17/2019   PHOS 4.4 12/26/2017   PROT 5.9 (L) 06/17/2019   ALBUMIN 4.0 06/17/2019   LABGLOB 1.9 06/17/2019   AGRATIO 2.1 06/17/2019   BILITOT <0.2 06/17/2019   ALKPHOS 131 (H) 06/17/2019   AST 16 06/17/2019   ALT 10  06/17/2019   ANIONGAP 12 09/22/2017      Objective    BP (!) 157/77 (BP Location: Left Arm, Patient Position: Sitting, Cuff Size: Normal)    Pulse 94    Temp (!) 97.2 F (36.2 C) (Temporal)    Resp 16    Wt 142 lb 3.2 oz (64.5 kg)    BMI 27.77 kg/m  BP Readings from Last 3 Encounters:  10/10/19 (!) 157/77  06/17/19 (!) 154/62  02/06/19 (!) 120/51   Wt Readings from Last 3 Encounters:  10/10/19 142 lb 3.2 oz (64.5 kg)  06/17/19 147 lb (66.7 kg)  01/22/19 153 lb 15.9 oz (69.8 kg)      Physical Exam Vitals reviewed.  Constitutional:      General: She is not in acute distress.    Appearance: Normal appearance. She is normal weight. She is not ill-appearing.  Cardiovascular:     Rate and Rhythm: Normal rate and regular rhythm.     Pulses: Normal pulses.     Heart sounds: Normal heart sounds.  Pulmonary:     Effort: Pulmonary effort is normal. No respiratory distress.     Breath sounds: Normal breath sounds.  Skin:    General: Skin is warm and dry.     Findings: Wound present.       Neurological:     Mental Status: She is alert.       No results found for any visits on 10/10/19.  Assessment & Plan     1. Wound cellulitis Wound culture obtained. Start Bactrim to cover MRSA. continnue warm compresses and cleaning. Will f/u pending results of culture and adjust therapy as needed. Call if worsening.  - sulfamethoxazole-trimethoprim (BACTRIM) 400-80 MG tablet; Take 1 tablet by mouth 2 (two) times daily.  Dispense: 14 tablet; Refill: 0 - Aerobic culture   No follow-ups on file.      Reynolds Bowl, PA-C, have reviewed all documentation for this visit. The documentation on 10/13/19 for the exam, diagnosis, procedures, and orders are all accurate and complete.   Rubye Beach  [facility] [phone] (phone) [phone] (fax)  [place]

## 2019-10-13 ENCOUNTER — Telehealth: Payer: Self-pay | Admitting: Family Medicine

## 2019-10-13 MED ORDER — TRAMADOL HCL 50 MG PO TABS: 50.0000 mg | ORAL_TABLET | Freq: Three times a day (TID) | ORAL | 0 refills | Status: AC | PRN

## 2019-10-13 NOTE — Telephone Encounter (Signed)
Tramadol sent in for pain.

## 2019-10-13 NOTE — Telephone Encounter (Signed)
Patient is calling to see if her culture is back from Friday. Please advise CB- [phone]

## 2019-10-13 NOTE — Telephone Encounter (Signed)
Pt called back and stated that she still has not heard from anyone in regards to the culture. Pt requests call back as soon as possible

## 2019-10-13 NOTE — Telephone Encounter (Signed)
Patient was advised that we are still waiting on the wound culture that it was pick up today this morning. Patient reports that she is in pain and is requesting something for the pain and doesn't see any improvement on the antibiotic.

## 2019-10-13 NOTE — Telephone Encounter (Signed)
Please review. Patient was last seen by [facility] on 10/10/2019.

## 2019-10-14 NOTE — Telephone Encounter (Signed)
Pt called back and would like a call back from clinic as soon as her results are ready for her latest lab culture

## 2019-10-15 ENCOUNTER — Telehealth: Payer: Self-pay

## 2019-10-15 DIAGNOSIS — F331 Major depressive disorder, recurrent, moderate: Secondary | ICD-10-CM | POA: Diagnosis not present

## 2019-10-15 DIAGNOSIS — F411 Generalized anxiety disorder: Secondary | ICD-10-CM | POA: Diagnosis not present

## 2019-10-15 DIAGNOSIS — G4700 Insomnia, unspecified: Secondary | ICD-10-CM | POA: Diagnosis not present

## 2019-10-15 LAB — AEROBIC CULTURE

## 2019-10-15 MED ORDER — CLINDAMYCIN HCL 300 MG PO CAPS: 300.0000 mg | ORAL_CAPSULE | Freq: Four times a day (QID) | ORAL | 0 refills | Status: DC

## 2019-10-15 NOTE — Telephone Encounter (Signed)
Can switch to clindamycin 450mg  TID x7d #21 r0 instead of Bactrim to continue to cover for MRSA as sensitivities are pending.

## 2019-10-15 NOTE — Telephone Encounter (Signed)
-----   Message from Mar Daring, [name] sent at 10/15/2019 10:11 AM EDT ----- Wound culture is positive for staph aureus. Sensitivities are still pending. How is the wound? How many more days of antibiotic does she have left?

## 2019-10-15 NOTE — Telephone Encounter (Signed)
Please review for [place].    Pt advised.  She reports her infection is not better and seems a little worse.  She states she only has one for day of antibiotic left.   Thanks,   -Mickel Baas

## 2019-10-15 NOTE — Telephone Encounter (Signed)
Patient advised as below. Patient verbalizes understanding and is in agreement with treatment plan.  

## 2019-10-15 NOTE — Telephone Encounter (Signed)
Pt called to report that clindamycin has made her sick in the past, pt is seeking alternative option please advise

## 2019-10-16 ENCOUNTER — Telehealth: Payer: Self-pay

## 2019-10-16 DIAGNOSIS — A4902 Methicillin resistant Staphylococcus aureus infection, unspecified site: Secondary | ICD-10-CM

## 2019-10-16 DIAGNOSIS — R110 Nausea: Secondary | ICD-10-CM

## 2019-10-16 MED ORDER — CLINDAMYCIN HCL 300 MG PO CAPS: 300.0000 mg | ORAL_CAPSULE | Freq: Four times a day (QID) | ORAL | 0 refills | Status: AC

## 2019-10-16 MED ORDER — ONDANSETRON HCL 4 MG PO TABS: 4.0000 mg | ORAL_TABLET | Freq: Three times a day (TID) | ORAL | 0 refills | Status: DC | PRN

## 2019-10-16 NOTE — Telephone Encounter (Signed)
Addressed in another phone note 

## 2019-10-16 NOTE — Telephone Encounter (Signed)
Will forward to provider that evaluated her.  Looks like Doxy is on allergy list and stating Bactrim did not help, so Clinda was the option left for MRSA coverage as sensitivities were not back for wound culture

## 2019-10-16 NOTE — Telephone Encounter (Signed)
Patient advised. She is willing to try Clindamycin. Pharmacy- [place]

## 2019-10-16 NOTE — Telephone Encounter (Signed)
Clindamycin and zofran sent in.

## 2019-10-16 NOTE — Telephone Encounter (Signed)
-----   Message from Mar Daring, [name] sent at 10/16/2019  9:33 AM EDT ----- Sensitivities have come back and we can use either doxycycline or clindamycin. I would recommend the clindamycin and we can also give Zofran for the nausea. Thoughts on doing this?

## 2019-10-17 ENCOUNTER — Other Ambulatory Visit: Payer: Self-pay | Admitting: Physician Assistant

## 2019-10-17 NOTE — Telephone Encounter (Signed)
Requested medication (s) are due for refill today: yes  Requested medication (s) are on the active medication list: yes  Last refill:  10/13/19  Future visit scheduled: no  Notes to clinic:  not delegated    Requested Prescriptions  Pending Prescriptions Disp Refills   traMADol (ULTRAM) 50 MG tablet [Pharmacy Med Name: TRAMADOL HCL 50 MG TABLET] 15 tablet 0    Sig: Take 1 tablet (50 mg total) by mouth every 8 (eight) hours as needed for up to 5 days.      Not Delegated - Analgesics:  Opioid Agonists Failed - 10/17/2019  8:39 AM      Failed - This refill cannot be delegated      Failed - Urine Drug Screen completed in last 360 days.      Passed - Valid encounter within last 6 months    Recent Outpatient Visits           1 week ago Wound cellulitis   [place], [name]   3 weeks ago Diarrhea of presumed infectious origin   [place], Clearnce Sorrel, PA-C   4 months ago Medicare annual wellness visit, subsequent   [facility] [name], MD   1 year ago Cellulitis of other specified site   [facility] [name], MD   1 year ago Acute frontal sinusitis, recurrence not specified   [facility] [name], MD

## 2019-10-26 ENCOUNTER — Other Ambulatory Visit: Payer: Self-pay | Admitting: Family Medicine

## 2019-10-27 NOTE — Telephone Encounter (Signed)
Requested medication (s) are due for refill today: yes  Requested medication (s) are on the active medication list: yes  Last refill:  10/11/19  Future visit scheduled: no  Notes to clinic:  not delegated    Requested Prescriptions  Pending Prescriptions Disp Refills   butalbital-acetaminophen-caffeine (FIORICET) 50-325-40 MG tablet [Pharmacy Med Name: BUTALB-ACETAMIN-CAFF 50-325-40] 30 tablet 3    Sig: TAKE 1 TABLET BY MOUTH EVERY 6 (SIX) HOURS AS NEEDED FOR HEADACHE.      Not Delegated - Analgesics:  Non-Opioid Analgesic Combinations Failed - 10/26/2019  8:18 PM      Failed - This refill cannot be delegated      Passed - Valid encounter within last 12 months    Recent Outpatient Visits           2 weeks ago Wound cellulitis   [place], [name]   1 month ago Diarrhea of presumed infectious origin   [facility], Clearnce Sorrel, PA-C   4 months ago Medicare annual wellness visit, subsequent   [facility] [name], MD   1 year ago Cellulitis of other specified site   [facility] [name], MD   1 year ago Acute frontal sinusitis, recurrence not specified   [facility] [name], MD

## 2019-10-29 ENCOUNTER — Telehealth: Payer: Self-pay | Admitting: Family Medicine

## 2019-10-29 NOTE — Chronic Care Management (AMB) (Signed)
  Chronic Care Management   Outreach Note  10/29/2019 Name: Katie Buckley MRN: [id number] DOB: 1950-10-16  Katie Buckley is a 69 y.o. year old female who is a primary care patient of Caryn Section, Kirstie Peri, MD. I reached out to [place] by phone today in response to a referral sent by Ms. [place] plan.     An unsuccessful telephone outreach was attempted today. The patient was referred to the case management team for assistance with care management and care coordination.   Follow Up Plan: A HIPPA compliant phone message was left for the patient providing contact information and requesting a return call. The care management team will reach out to the patient again over the next 7 days. If patient returns call to provider office, please advise to call [place] at [phone].  [place], [place] [address] Direct Dial: [phone] [email or web address] Website: [email or web address]

## 2019-11-03 NOTE — Chronic Care Management (AMB) (Signed)
  Chronic Care Management   Note  11/03/2019 Name: CARRY WEESNER MRN: [id number] DOB: [birth year]  Katie Buckley is a 69 y.o. year old female who is a primary care patient of Caryn Section, Kirstie Peri, MD. I reached out to [place] by phone today in response to a referral sent by Ms. [place] Guerra plan.     Ms. Cullifer was given information about Chronic Care Management services today including:  1. CCM service includes personalized support from designated clinical staff supervised by her physician, including individualized plan of care and coordination with other care providers 2. 24/7 contact phone numbers for assistance for urgent and routine care needs. 3. Service will only be billed when office clinical staff spend 20 minutes or more in a month to coordinate care. 4. Only one practitioner may furnish and bill the service in a calendar month. 5. The patient may stop CCM services at any time (effective at the end of the month) by phone call to the office staff. 6. The patient will be responsible for cost sharing (co-pay) of up to 20% of the service fee (after annual deductible is met).  Patient agreed to services and verbal consent obtained.   Follow up plan: Telephone appointment with care management team member scheduled for: 12/19/2019  [place], [place] Management  [place], [place] [address] Direct Dial: [email or web address] Website: [email or web address]

## 2019-11-03 NOTE — Chronic Care Management (AMB) (Signed)
  Chronic Care Management   Note  11/03/2019 Name: Katie Buckley MRN: [id number] DOB: 24-Nov-1950  Katie Buckley is a 69 y.o. year old female who is a primary care patient of Caryn Section, Kirstie Peri, MD. I reached out to [place] by phone today in response to a referral sent by Ms. [place] plan.     Ms. [place]on was given information about Chronic Care Management services today including:  1. CCM service includes personalized support from designated clinical staff supervised by her physician, including individualized plan of care and coordination with other care providers 2. 24/7 contact phone numbers for assistance for urgent and routine care needs. 3. Service will only be billed when office clinical staff spend 20 minutes or more in a month to coordinate care. 4. Only one practitioner may furnish and bill the service in a calendar month. 5. The patient may stop CCM services at any time (effective at the end of the month) by phone call to the office staff. 6. The patient will be responsible for cost sharing (co-pay) of up to 20% of the service fee (after annual deductible is met).  Patient agreed to services and verbal consent obtained.   Follow up plan: A telephone outreach was made patient would like an early morning call I have put back a message to Pharmacist to see if we can schedule earlier appointment. Will call patient back.   [place],  [id number] Direct Dial: [phone] [email or web address] Website: [email or web address]

## 2019-11-05 ENCOUNTER — Telehealth: Payer: Self-pay

## 2019-11-05 NOTE — Telephone Encounter (Signed)
Please review message below and advise patient if you are able to work her into your schedule.   Copied from [place] [phone]. Topic: General - Other >> Nov 05, 2019 12:20 PM Leward Quan A wrote: Reason for CRM: Patient called to say that she would like to schedule a Medicare Wellness Visit with the Nurse Health Advisor when ever she can preferably on the day that she is scheduled to see Dr Caryn Section 11/19/19. Please contact patient at Ph#  [phone]

## 2019-11-06 ENCOUNTER — Other Ambulatory Visit: Payer: Self-pay | Admitting: Family Medicine

## 2019-11-06 NOTE — Telephone Encounter (Signed)
Patient is checking status on medication refill [phone]

## 2019-11-06 NOTE — Telephone Encounter (Signed)
Requested medication (s) are due for refill today: no  Requested medication (s) are on the active medication list: yes  Last refill:  10/11/2019  Future visit scheduled: yes  Notes to clinic: this refill cannot be delegated    Requested Prescriptions  Pending Prescriptions Disp Refills   butalbital-acetaminophen-caffeine (FIORICET) 50-325-40 MG tablet [Pharmacy Med Name: BUTALB-ACETAMIN-CAFF 50-325-40] 30 tablet 3    Sig: TAKE 1 TABLET BY MOUTH EVERY 6 (SIX) HOURS AS NEEDED FOR HEADACHE.      Not Delegated - Analgesics:  Non-Opioid Analgesic Combinations Failed - 11/06/2019 10:51 AM      Failed - This refill cannot be delegated      Passed - Valid encounter within last 12 months    Recent Outpatient Visits           3 weeks ago Wound cellulitis   [place], [name]   1 month ago Diarrhea of presumed infectious origin   [facility], Clearnce Sorrel, PA-C   4 months ago Medicare annual wellness visit, subsequent   [facility] [name], MD   1 year ago Cellulitis of other specified site   [facility] [name], MD   1 year ago Acute frontal sinusitis, recurrence not specified   [facility] [name], MD       Future Appointments             In 1 week Fisher, Kirstie Peri, MD [facility] [place], [place]

## 2019-11-07 ENCOUNTER — Other Ambulatory Visit: Payer: Self-pay | Admitting: Family Medicine

## 2019-11-07 DIAGNOSIS — IMO0002 Reserved for concepts with insufficient information to code with codable children: Secondary | ICD-10-CM

## 2019-11-07 NOTE — Telephone Encounter (Signed)
Requested Prescriptions  Pending Prescriptions Disp Refills  . TRULICITY 1.5 0000000 SOPN [Pharmacy Med Name: TRULICITY 1.5 [id number] ML PEN]      Sig: INJECT 1.5 MG INTO THE SKIN ONCE A WEEK.     Endocrinology:  Diabetes - GLP-1 Receptor Agonists Failed - 11/07/2019  1:11 AM      Failed - HBA1C is between 0 and 7.9 and within 180 days    Hemoglobin A1C  Date Value Ref Range Status  07/13/2016 8.5  Final   Hgb A1c MFr Bld  Date Value Ref Range Status  06/17/2019 11.8 (H) 4.8 - 5.6 % Final    Comment:             Prediabetes: 5.7 - 6.4          Diabetes: >6.4          Glycemic control for adults with diabetes: <7.0          Passed - Valid encounter within last 6 months    Recent Outpatient Visits          4 weeks ago Wound cellulitis   [place], PA-C   1 month ago Diarrhea of presumed infectious origin   [place], PA-C   4 months ago Medicare annual wellness visit, subsequent   [facility] [name], MD   1 year ago Cellulitis of other specified site   [facility] [name], MD   1 year ago Acute frontal sinusitis, recurrence not specified   [facility] [name], MD      Future Appointments            In 1 week Fisher, Kirstie Peri, MD [facility], [place]

## 2019-11-13 DIAGNOSIS — F331 Major depressive disorder, recurrent, moderate: Secondary | ICD-10-CM | POA: Diagnosis not present

## 2019-11-13 DIAGNOSIS — G4700 Insomnia, unspecified: Secondary | ICD-10-CM | POA: Diagnosis not present

## 2019-11-13 DIAGNOSIS — F411 Generalized anxiety disorder: Secondary | ICD-10-CM | POA: Diagnosis not present

## 2019-11-13 NOTE — Telephone Encounter (Signed)
Called pt to confirm telephonic AWV. Pt agreed to apt on 11/19/19 @ 3:20 PM (already scheduled).

## 2019-11-18 NOTE — Progress Notes (Signed)
Established patient visit   Patient: Katie Buckley   DOB: September 27, 1950   69 y.o. Female  MRN: [id number] Visit Date: 11/19/2019  Today's healthcare provider: Lelon Huh, MD   Chief Complaint  Patient presents with  . Diabetes  . Hypertension  . Hyperlipidemia  . Dizziness   Subjective    HPI Diabetes Mellitus Type II, Follow-up  Lab Results  Component Value Date   HGBA1C 11.8 (H) 06/17/2019   HGBA1C 11.2 (A) 11/13/2017   HGBA1C >14.0 04/18/2017   Wt Readings from Last 3 Encounters:  11/19/19 140 lb (63.5 kg)  10/10/19 142 lb 3.2 oz (64.5 kg)  06/17/19 147 lb (66.7 kg)   Last seen for diabetes 5 months ago.  Management since then includes no changes; she was unsure which medications she took. Patient was advised to call back to the office after that appointment so that we could reconcile her medications.  She was also advised to follow up in 1 month.  She had 2 acute visits with out her providers since that time but had not had follow up on her diabetes. She reports good compliance with treatment. She is not having side effects. Symptoms: No fatigue No foot ulcerations  Yes appetite changes Yes nausea  Yes paresthesia of the feet  No polydipsia  No polyuria No visual disturbances   No vomiting     Home blood sugar records: fasting range: 160-200  Episodes of hypoglycemia? No    Current insulin regiment: none Most Recent Eye Exam: not UTD Current exercise: none Current diet habits: not asked, non specific  Pertinent Labs: Lab Results  Component Value Date   CHOL 319 (H) 06/17/2019   HDL 78 06/17/2019   LDLCALC 190 (H) 06/17/2019   TRIG 264 (H) 06/17/2019   CHOLHDL 4.1 06/17/2019   Lab Results  Component Value Date   NA 135 06/17/2019   K 4.4 06/17/2019   CREATININE 0.62 06/17/2019   GFRNONAA 94 06/17/2019   GFRAA 108 06/17/2019   GLUCOSE 400 (H) 06/17/2019      ---------------------------------------------------------------------------------------------------  Hypertension, follow-up  BP Readings from Last 3 Encounters:  11/19/19 118/62  10/10/19 (!) 157/77  06/17/19 (!) 154/62   Wt Readings from Last 3 Encounters:  11/19/19 140 lb (63.5 kg)  10/10/19 142 lb 3.2 oz (64.5 kg)  06/17/19 147 lb (66.7 kg)     She was last seen for hypertension 5 months ago.  BP at that visit was 154/62. Management since that visit includes no changes; patient was advised to call the office back so that we could reconcile her medications.  She reports good compliance with treatment. She is not having side effects.  She does not smoke.  Outside blood pressures are 130's over 70's. Symptoms: No chest pain No chest pressure  No palpitations No syncope  No dyspnea No orthopnea  No paroxysmal nocturnal dyspnea No lower extremity edema   Pertinent labs: Lab Results  Component Value Date   CHOL 319 (H) 06/17/2019   HDL 78 06/17/2019   LDLCALC 190 (H) 06/17/2019   TRIG 264 (H) 06/17/2019   CHOLHDL 4.1 06/17/2019   Lab Results  Component Value Date   NA 135 06/17/2019   K 4.4 06/17/2019   CREATININE 0.62 06/17/2019   GFRNONAA 94 06/17/2019   GFRAA 108 06/17/2019   GLUCOSE 400 (H) 06/17/2019     The 10-year ASCVD risk score Mikey Bussing DC Jr., et al., 2013) is: 17.5%   ---------------------------------------------------------------------------------------------------  Lipid/Cholesterol, Follow-up  Last lipid panel Other pertinent labs  Lab Results  Component Value Date   CHOL 319 (H) 06/17/2019   HDL 78 06/17/2019   LDLCALC 190 (H) 06/17/2019   TRIG 264 (H) 06/17/2019   CHOLHDL 4.1 06/17/2019   Lab Results  Component Value Date   ALT 10 06/17/2019   AST 16 06/17/2019   PLT 232 06/17/2019   TSH 1.160 05/18/2015     She was last seen for this 5 months ago.  Management since that visit includes added Rosuvastatin 20mg  daily. Patient advised  to continue taking Zetia. Patient was advised to follow up in 1 month after the medication was added.  She cancelled that appointment and needs to have this rechecked today.  She reports good compliance with treatment. She is not sure if she is having side effects. She has had upset stomach with diarrhea, increased burping and bad taste when burps  Symptoms: No chest pain No chest pressure/discomfort  No dyspnea No lower extremity edema  No numbness or tingling of extremity No orthopnea  No palpitations No paroxysmal nocturnal dyspnea  No speech difficulty No syncope    The 10-year ASCVD risk score Mikey Bussing DC Jr., et al., 2013) is: 17.5%  --------------------------------------------------------------------------------------------------- Falls The patient also complains of having increased falls.  She states she thinks it is from being off balance.  She has fallen in the shower a few times and once while carrying packages into the her apartment.  She has sustained bruises from these falls but reports no other symptoms and did not seek any medical attention for them.      Medications: Outpatient Medications Prior to Visit  Medication Sig Note  . ARIPiprazole (ABILIFY) 5 MG tablet Take 5 mg by mouth daily.   . Biotin 10 MG CAPS Take 10 mg by mouth daily after lunch.   Marland Kitchen buPROPion (WELLBUTRIN SR) 150 MG 12 hr tablet Take 150 mg by mouth 2 (two) times daily. Morning & 3pm 11/19/2019: Patient thinks this was changed to Cymbalta  . butalbital-acetaminophen-caffeine (FIORICET) 50-325-40 MG tablet TAKE 1 TABLET BY MOUTH EVERY 6 (SIX) HOURS AS NEEDED FOR HEADACHE.   Marland Kitchen Cholecalciferol (VITAMIN D3 PO) Take 1 tablet by mouth daily with lunch.   . esomeprazole (NEXIUM) 20 MG capsule Take 40 mg by mouth daily before breakfast.    . ezetimibe (ZETIA) 10 MG tablet TAKE 1 TABLET BY MOUTH EVERYDAY AT BEDTIME   . fexofenadine (ALLEGRA) 180 MG tablet Take 180 mg by mouth daily as needed for allergies or  rhinitis.   . fluticasone (FLONASE) 50 MCG/ACT nasal spray Place 2 sprays into both nostrils daily as needed for allergies.   Marland Kitchen gabapentin (NEURONTIN) 300 MG capsule Take 2 capsules (600 mg total) by mouth 2 (two) times daily. Morning & supper   . glipiZIDE (GLUCOTROL) 10 MG tablet Take 1 tablet (10 mg total) by mouth daily before breakfast.   . hydrOXYzine (ATARAX/VISTARIL) 50 MG tablet Take 50 mg by mouth 2 (two) times daily. Morning & 3pm   . JARDIANCE 10 MG TABS tablet TAKE 10 MG BY MOUTH DAILY.   Marland Kitchen ketotifen (ZADITOR) 0.025 % ophthalmic solution Place 1 drop into both eyes 2 (two) times daily as needed (dry/irritated eyes.).   Marland Kitchen Lancet Devices MISC    . meloxicam (MOBIC) 15 MG tablet Take 1 tablet (15 mg total) by mouth daily. (Patient taking differently: Take 15 mg by mouth daily as needed (pain.). )   . metFORMIN (GLUCOPHAGE-XR) 500 MG 24 hr  tablet TAKE 1 TABLET BY MOUTH EVERY DAY WITH BREAKFAST   . metoprolol succinate (TOPROL-XL) 25 MG 24 hr tablet TAKE 1 TABLET BY MOUTH EVERY DAY (Patient not taking: Reported on 11/19/2019)   . metoprolol-hydrochlorothiazide (LOPRESSOR HCT) 100-25 MG tablet TAKE 1/2 TABLET BY MOUTH EVERY DAY   . montelukast (SINGULAIR) 10 MG tablet TAKE 1 TABLET BY MOUTH EVERY DAY (Patient not taking: No sig reported)   . ondansetron (ZOFRAN) 4 MG tablet Take 1 tablet (4 mg total) by mouth every 8 (eight) hours as needed.   . rosuvastatin (CRESTOR) 20 MG tablet Take 1 tablet (20 mg total) by mouth daily.   . sertraline (ZOLOFT) 100 MG tablet Take 200 mg by mouth daily.    Marland Kitchen sulfamethoxazole-trimethoprim (BACTRIM) 400-80 MG tablet Take 1 tablet by mouth 2 (two) times daily. (Patient not taking: Reported on 11/19/2019)   . traZODone (DESYREL) 50 MG tablet Take 50-100 mg by mouth at bedtime as needed for sleep.    . TRULICITY 1.5 GU/4.4IH SOPN INJECT 1.5 MG INTO THE SKIN ONCE A WEEK.   . valACYclovir (VALTREX) 1000 MG tablet Take 1 tablet daily for 5 days as needed (Patient not  taking: Reported on 11/19/2019)   . valsartan-hydrochlorothiazide (DIOVAN HCT) 80-12.5 MG tablet Take 1 tablet by mouth daily.    No facility-administered medications prior to visit.    Review of Systems  Constitutional: Negative for appetite change, chills, fatigue and fever.  Respiratory: Negative for chest tightness and shortness of breath.   Cardiovascular: Negative for chest pain and palpitations.  Gastrointestinal: Negative for abdominal pain, nausea and vomiting.  Neurological: Negative for dizziness and weakness.      Objective    BP 118/62 (BP Location: Right Arm, Patient Position: Sitting, Cuff Size: Normal)   Pulse 77   Temp (!) 97.1 F (36.2 C) (Skin)   Wt 140 lb (63.5 kg)   SpO2 98%   BMI 27.34 kg/m     Orthostatic Vitals for the past 48 hrs (Last 6 readings):  Patient Position Orthostatic BP Orthostatic Pulse  11/19/19 0838 Sitting -- --  11/19/19 0858 Sitting 95/61 75  11/19/19 0902 Standing 100/65 80  11/19/19 0905 Supine 123/75 75    Physical Exam   General: Appearance:     Overweight female in no acute distress  Eyes:    PERRL, conjunctiva/corneas clear, EOM's intact       Lungs:     Clear to auscultation bilaterally, respirations unlabored  Heart:    Normal heart rate. Normal rhythm.  2/6 harsh, crescendo-decrescendo, systolic murmur at right upper sternal border  MS:   All extremities are intact.   Neurologic:   Awake, alert, oriented x 3. No apparent focal neurological           defect. Romberg negative. Unsteady when walking tandem line. Normal finger to nose.         Results for orders placed or performed in visit on 11/19/19  POCT HgB A1C  Result Value Ref Range   Hemoglobin A1C 9.6 (A) 4.0 - 5.6 %   Est. average glucose Bld gHb Est-mCnc 229     Assessment & Plan     1. Uncontrolled type 2 diabetes mellitus with diabetic neuropathy, without long-term current use of insulin ([place]) She is having much more nausea on current dose of Trulicity.  Will change to Ozempic.  - Comprehensive metabolic panel - Lipid Panel With LDL/HDL Ratio - TSH - Semaglutide, 1 MG/DOSE, (OZEMPIC, 1 MG/DOSE,) 2 MG/1.5ML  SOPN; Inject 0.75 mLs (1 mg total) into the skin once a week.  Dispense: 2 pen; Refill: 5  2. Essential hypertension Is borderline orthostatic with symptoms. Will stop valsartan for now.   3. Hyperlipidemia, mixed Tolerating ezetimibe well. Intolerant to statins.  -Lipid panel  4. Nausea refill- ondansetron (ZOFRAN) 4 MG tablet; Take 1 tablet (4 mg total) by mouth every 8 (eight) hours as needed.  Dispense: 20 tablet; Refill: 0 Change Trulicity to Ozempic as above.   5. Dizziness Some component of orthostasis. Put valsartan on hold as abov.e  - EKG 12-Lead - CBC  6. Seasonal allergic rhinitis due to pollen Refill.  - montelukast (SINGULAIR) 10 MG tablet; Take 1 tablet (10 mg total) by mouth daily.  Dispense: 30 tablet; Refill: 2  7. Frequent falls Unclear if this entirely due to orthostasis. Will recheck in about 3 weeks.      Addressed extensive list of chronic and acute medical problems today requiring 45 minutes reviewing her medical record, counseling patient regarding her conditions and coordination of care.     The entirety of the information documented in the History of Present Illness, Review of Systems and Physical Exam were personally obtained by me. Portions of this information were initially documented by the CMA and reviewed by me for thoroughness and accuracy.      Lelon Huh, MD  [facility] [phone] (phone) [phone] (fax)  [place]

## 2019-11-18 NOTE — Progress Notes (Signed)
Pt did not answer for telephonic AWV. This encounter was created in error - please disregard.

## 2019-11-19 ENCOUNTER — Ambulatory Visit (INDEPENDENT_AMBULATORY_CARE_PROVIDER_SITE_OTHER): Payer: PPO | Admitting: Family Medicine

## 2019-11-19 ENCOUNTER — Other Ambulatory Visit: Payer: Self-pay

## 2019-11-19 ENCOUNTER — Encounter: Payer: Self-pay | Admitting: Family Medicine

## 2019-11-19 VITALS — BP 118/62 | HR 77 | Temp 97.1°F | Wt 140.0 lb

## 2019-11-19 DIAGNOSIS — IMO0002 Reserved for concepts with insufficient information to code with codable children: Secondary | ICD-10-CM

## 2019-11-19 DIAGNOSIS — R110 Nausea: Secondary | ICD-10-CM

## 2019-11-19 DIAGNOSIS — E782 Mixed hyperlipidemia: Secondary | ICD-10-CM

## 2019-11-19 DIAGNOSIS — R42 Dizziness and giddiness: Secondary | ICD-10-CM | POA: Diagnosis not present

## 2019-11-19 DIAGNOSIS — E1140 Type 2 diabetes mellitus with diabetic neuropathy, unspecified: Secondary | ICD-10-CM

## 2019-11-19 DIAGNOSIS — I10 Essential (primary) hypertension: Secondary | ICD-10-CM

## 2019-11-19 DIAGNOSIS — J301 Allergic rhinitis due to pollen: Secondary | ICD-10-CM | POA: Diagnosis not present

## 2019-11-19 DIAGNOSIS — E1165 Type 2 diabetes mellitus with hyperglycemia: Secondary | ICD-10-CM

## 2019-11-19 DIAGNOSIS — R296 Repeated falls: Secondary | ICD-10-CM

## 2019-11-19 LAB — POCT GLYCOSYLATED HEMOGLOBIN (HGB A1C)
Est. average glucose Bld gHb Est-mCnc: 229
Hemoglobin A1C: 9.6 % — AB (ref 4.0–5.6)

## 2019-11-19 MED ORDER — OZEMPIC (1 MG/DOSE) 2 MG/1.5ML ~~LOC~~ SOPN: 1.0000 mg | PEN_INJECTOR | SUBCUTANEOUS | 5 refills | Status: DC

## 2019-11-19 MED ORDER — MONTELUKAST SODIUM 10 MG PO TABS: 10.0000 mg | ORAL_TABLET | Freq: Every day | ORAL | 2 refills | Status: DC

## 2019-11-19 MED ORDER — ONDANSETRON HCL 4 MG PO TABS: 4.0000 mg | ORAL_TABLET | Freq: Three times a day (TID) | ORAL | 0 refills | Status: DC | PRN

## 2019-11-19 MED ORDER — VALSARTAN-HYDROCHLOROTHIAZIDE 80-12.5 MG PO TABS: 0.5000 | ORAL_TABLET | Freq: Every day | ORAL | Status: DC

## 2019-11-19 NOTE — Patient Instructions (Addendum)
.   Please review the attached list of medications and notify my office if there are any errors.   . Please bring all of your medications to every appointment so we can make sure that our medication list is the same as yours.   Stop taking valsartan-hctz for now

## 2019-11-20 LAB — COMPREHENSIVE METABOLIC PANEL
ALT: 21 IU/L (ref 0–32)
AST: 25 IU/L (ref 0–40)
Albumin/Globulin Ratio: 2 (ref 1.2–2.2)
Albumin: 4.4 g/dL (ref 3.8–4.8)
Alkaline Phosphatase: 102 IU/L (ref 48–121)
BUN/Creatinine Ratio: 18 (ref 12–28)
BUN: 14 mg/dL (ref 8–27)
Bilirubin Total: 0.3 mg/dL (ref 0.0–1.2)
CO2: 25 mmol/L (ref 20–29)
Calcium: 9.7 mg/dL (ref 8.7–10.3)
Chloride: 97 mmol/L (ref 96–106)
Creatinine, Ser: 0.79 mg/dL (ref 0.57–1.00)
GFR calc Af Amer: 89 mL/min/{1.73_m2} (ref >59)
GFR calc non Af Amer: 77 mL/min/{1.73_m2} (ref >59)
Globulin, Total: 2.2 g/dL (ref 1.5–4.5)
Glucose: 180 mg/dL — ABNORMAL HIGH (ref 65–99)
Potassium: 3.7 mmol/L (ref 3.5–5.2)
Sodium: 139 mmol/L (ref 134–144)
Total Protein: 6.6 g/dL (ref 6.0–8.5)

## 2019-11-20 LAB — CBC
Hematocrit: 40.4 % (ref 34.0–46.6)
Hemoglobin: 13.2 g/dL (ref 11.1–15.9)
MCH: 26.8 pg (ref 26.6–33.0)
MCHC: 32.7 g/dL (ref 31.5–35.7)
MCV: 82 fL (ref 79–97)
Platelets: 175 10*3/uL (ref 150–450)
RBC: 4.93 x10E6/uL (ref 3.77–5.28)
RDW: 14.6 % (ref 11.7–15.4)
WBC: 7.3 10*3/uL (ref 3.4–10.8)

## 2019-11-20 LAB — LIPID PANEL WITH LDL/HDL RATIO
Cholesterol, Total: 229 mg/dL — ABNORMAL HIGH (ref 100–199)
HDL: 95 mg/dL (ref >39)
LDL Chol Calc (NIH): 104 mg/dL — ABNORMAL HIGH (ref 0–99)
LDL/HDL Ratio: 1.1 ratio (ref 0.0–3.2)
Triglycerides: 178 mg/dL — ABNORMAL HIGH (ref 0–149)
VLDL Cholesterol Cal: 30 mg/dL (ref 5–40)

## 2019-11-20 LAB — TSH: TSH: 1.38 u[IU]/mL (ref 0.450–4.500)

## 2019-11-20 NOTE — Addendum Note (Signed)
Addended by: [name] on: 11/20/2019 08:34 AM   Modules accepted: Orders

## 2019-11-25 ENCOUNTER — Telehealth: Payer: Self-pay

## 2019-11-25 NOTE — Telephone Encounter (Signed)
Attempted to contact patient, no answer left voicemail. Need more information. Patient will need to schedule a virtual appointment. Dr. Caryn Section is booked for the week so it will have to be with a different provider. OK for [facility] to take care of this.

## 2019-11-25 NOTE — Telephone Encounter (Signed)
Copied from [place] [phone]. Topic: General - Inquiry >> Nov 25, 2019  2:40 PM Alease Frame wrote: Reason for CRM: pt wanted to let Dr know that she has been having chills also she's experiencing change in taste buds. She would like a call back please from nurse

## 2019-11-28 ENCOUNTER — Telehealth (INDEPENDENT_AMBULATORY_CARE_PROVIDER_SITE_OTHER): Payer: PPO | Admitting: Physician Assistant

## 2019-11-28 ENCOUNTER — Encounter: Payer: Self-pay | Admitting: Physician Assistant

## 2019-11-28 DIAGNOSIS — J0140 Acute pansinusitis, unspecified: Secondary | ICD-10-CM

## 2019-11-28 DIAGNOSIS — B370 Candidal stomatitis: Secondary | ICD-10-CM

## 2019-11-28 MED ORDER — FLUCONAZOLE 150 MG PO TABS: 150.0000 mg | ORAL_TABLET | Freq: Once | ORAL | 0 refills | Status: AC

## 2019-11-28 MED ORDER — AMOXICILLIN 875 MG PO TABS: 875.0000 mg | ORAL_TABLET | Freq: Two times a day (BID) | ORAL | 0 refills | Status: DC

## 2019-11-28 NOTE — Progress Notes (Signed)
Virtual telephone visit    Virtual Visit via Telephone Note   This visit type was conducted due to national recommendations for restrictions regarding the COVID-19 Pandemic (e.g. social distancing) in an effort to limit this patient's exposure and mitigate transmission in our community. Due to her co-morbid illnesses, this patient is at least at moderate risk for complications without adequate follow up. This format is felt to be most appropriate for this patient at this time. The patient did not have access to video technology or had technical difficulties with video requiring transitioning to audio format only (telephone). Physical exam was limited to content and character of the telephone converstion.    Patient location: Driving home from work Provider location: [facility]   Visit Date: 11/28/2019  Today's healthcare provider: Mar Daring, PA-C   Chief Complaint  Patient presents with  . Otalgia   Subjective    HPI Patient reports that she has been having chills  For about 2-3 weeks and ear ache, left side over the last week. Noticing having headaches and more congestion over last week. Reports that she taste salt on everything. She has been taking Tylenol. Denies cough, runny nose, or sorethroat.   Patient Active Problem List   Diagnosis Date Noted  . MRSA cellulitis 04/19/2018  . Decreased libido 02/04/2018  . Uncontrolled type 2 diabetes mellitus with diabetic neuropathy, without long-term current use of insulin ([place]) 04/20/2017  . H/O adenomatous polyp of colon 08/20/2015  . Rupture of implant of right breast 05/18/2015  . Anxiety disorder 04/26/2015  . Allergic rhinitis 04/21/2015  . Anemia 04/21/2015  . Asthma 04/21/2015  . 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 Chronic infection of sinus 04/21/2015  . Depression 04/21/2015  . GERD (gastroesophageal reflux disease) 04/21/2015  . Headache 04/21/2015  . Menopausal symptoms 04/21/2015  . Obesity 04/21/2015  .  Palpitations 04/21/2015  . Vitamin B12 deficiency 04/21/2015  . Aortic stenosis 01/21/2013  . Diastolic dysfunction 12/03/7626  . Diabetes mellitus out of control ([place]) 06/12/2006  . Barrett esophagus 06/13/2003  . Essential hypertension 06/12/1998  . Genital herpes 06/12/1998  . Hyperlipidemia, mixed 06/12/1998   Past Medical History:  Diagnosis Date  . Anxiety   . Depression   . Diabetes mellitus without complication ([place])   . Frequent sinus infections   . GERD (gastroesophageal reflux disease) 04/21/2015   History of Barrett's esophagus   . Heart murmur   . Hyperchloremia   . Hypertension   . MRSA (methicillin resistant staph aureus) culture positive 2018   history of, in hand  . Neuromuscular disorder (H[facility])    neuropathy  . Palpitations   . Seasonal allergies   . Skin cancer    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      Medications: Outpatient Medications Prior to Visit  Medication Sig  . Biotin 10 MG CAPS Take 10 mg by mouth daily after lunch.  . butalbital-acetaminophen-caffeine (FIORICET) 50-325-40 MG tablet TAKE 1 TABLET BY MOUTH EVERY 6 (SIX) HOURS AS NEEDED FOR HEADACHE.  Marland Kitchen Cholecalciferol (VITAMIN D3 PO) Take 1 tablet by mouth daily with lunch.  . esomeprazole (NEXIUM) 20 MG capsule Take 40 mg by mouth daily before breakfast.   . ezetimibe (ZETIA) 10 MG tablet TAKE 1 TABLET BY MOUTH EVERYDAY AT BEDTIME  . fexofenadine (ALLEGRA) 180 MG tablet Take 180 mg by mouth daily as needed for allergies or rhinitis.  . fluticasone (FLONASE) 50 MCG/ACT nasal spray Place 2 sprays into both nostrils daily  as needed for allergies.  Marland Kitchen gabapentin (NEURONTIN) 300 MG capsule Take 2 capsules (600 mg total) by mouth 2 (two) times daily. Morning & supper  . glipiZIDE (GLUCOTROL) 10 MG tablet Take 1 tablet (10 mg total) by mouth daily before breakfast.  . hydrOXYzine (ATARAX/VISTARIL) 50 MG tablet Take 50 mg by mouth 2 (two) times daily. Morning & 3pm  . JARDIANCE 10 MG TABS tablet  TAKE 10 MG BY MOUTH DAILY.  Marland Kitchen ketotifen (ZADITOR) 0.025 % ophthalmic solution Place 1 drop into both eyes 2 (two) times daily as needed (dry/irritated eyes.).  Marland Kitchen Lancet Devices MISC   . meloxicam (MOBIC) 15 MG tablet Take 1 tablet (15 mg total) by mouth daily. (Patient taking differently: Take 15 mg by mouth daily as needed (pain.). )  . metFORMIN (GLUCOPHAGE-XR) 500 MG 24 hr tablet TAKE 1 TABLET BY MOUTH EVERY DAY WITH BREAKFAST  . metoprolol-hydrochlorothiazide (LOPRESSOR HCT) 100-25 MG tablet TAKE 1/2 TABLET BY MOUTH EVERY DAY  . montelukast (SINGULAIR) 10 MG tablet Take 1 tablet (10 mg total) by mouth daily.  . ondansetron (ZOFRAN) 4 MG tablet Take 1 tablet (4 mg total) by mouth every 8 (eight) hours as needed.  . rosuvastatin (CRESTOR) 20 MG tablet Take 1 tablet (20 mg total) by mouth daily.  . Semaglutide, 1 MG/DOSE, (OZEMPIC, 1 MG/DOSE,) 2 MG/1.5ML SOPN Inject 0.75 mLs (1 mg total) into the skin once a week.  . sertraline (ZOLOFT) 100 MG tablet Take 200 mg by mouth daily.   . traZODone (DESYREL) 50 MG tablet Take 50-100 mg by mouth at bedtime as needed for sleep.   . ARIPiprazole (ABILIFY) 5 MG tablet Take 5 mg by mouth daily. (Patient not taking: Reported on 11/28/2019)  . buPROPion (WELLBUTRIN SR) 150 MG 12 hr tablet Take 150 mg by mouth 2 (two) times daily. Morning & 3pm (Patient not taking: Reported on 11/28/2019)  . valACYclovir (VALTREX) 1000 MG tablet Take 1 tablet daily for 5 days as needed (Patient not taking: Reported on 11/19/2019)   No facility-administered medications prior to visit.    Review of Systems  Constitutional: Positive for chills.  HENT: Positive for congestion and ear pain. Negative for sore throat.   Respiratory: Negative for chest tightness and shortness of breath.     Last CBC Lab Results  Component Value Date   WBC 7.3 11/19/2019   HGB 13.2 11/19/2019   HCT 40.4 11/19/2019   MCV 82 11/19/2019   MCH 26.8 11/19/2019   RDW 14.6 11/19/2019   PLT 175  63/87/5643   Last metabolic panel Lab Results  Component Value Date   GLUCOSE 180 (H) 11/19/2019   NA 139 11/19/2019   K 3.7 11/19/2019   CL 97 11/19/2019   CO2 25 11/19/2019   BUN 14 11/19/2019   CREATININE 0.79 11/19/2019   GFRNONAA 77 11/19/2019   GFRAA 89 11/19/2019   CALCIUM 9.7 11/19/2019   PHOS 4.4 12/26/2017   PROT 6.6 11/19/2019   ALBUMIN 4.4 11/19/2019   LABGLOB 2.2 11/19/2019   AGRATIO 2.0 11/19/2019   BILITOT 0.3 11/19/2019   ALKPHOS 102 11/19/2019   AST 25 11/19/2019   ALT 21 11/19/2019   ANIONGAP 12 09/22/2017      Objective    There were no vitals taken for this visit. BP Readings from Last 3 Encounters:  11/19/19 118/62  10/10/19 (!) 157/77  06/17/19 (!) 154/62   Wt Readings from Last 3 Encounters:  11/19/19 140 lb (63.5 kg)  10/10/19 142 lb 3.2  oz (64.5 kg)  06/17/19 147 lb (66.7 kg)        Assessment & Plan     1. Acute non-recurrent pansinusitis Having congestion and runny nose on the left with associated ear pain. Will treat with amoxil as below. Call if not improving.  - amoxicillin (AMOXIL) 875 MG tablet; Take 1 tablet (875 mg total) by mouth 2 (two) times daily.  Dispense: 14 tablet; Refill: 0  2. Thrush Patient reports salt sensitivity to taste and has white coating on some of tongue. Feels tongue is a little swollen also. Tongue and mouth burn with brushing. Will try diflucan as below for possible thrush. Advised if not improving on either issue she needs to be evaluated in person. - fluconazole (DIFLUCAN) 150 MG tablet; Take 1 tablet (150 mg total) by mouth once for 1 dose. May take 2nd tab if needed after 48-72 hrs  Dispense: 2 tablet; Refill: 0   No follow-ups on file.    I discussed the assessment and treatment plan with the patient. The patient was provided an opportunity to ask questions and all were answered. The patient agreed with the plan and demonstrated an understanding of the instructions.   The patient was advised to  call back or seek an in-person evaluation if the symptoms worsen or if the condition fails to improve as anticipated.  I provided 13 minutes of non-face-to-face time during this encounter.  Reynolds Bowl, PA-C, have reviewed all documentation for this visit. The documentation on 11/30/19 for the exam, diagnosis, procedures, and orders are all accurate and complete.  [facility] [phone] (phone) [phone] (fax)  [place]

## 2019-11-30 ENCOUNTER — Encounter: Payer: Self-pay | Admitting: Physician Assistant

## 2019-12-07 ENCOUNTER — Other Ambulatory Visit: Payer: Self-pay | Admitting: Family Medicine

## 2019-12-07 DIAGNOSIS — IMO0002 Reserved for concepts with insufficient information to code with codable children: Secondary | ICD-10-CM

## 2019-12-07 NOTE — Telephone Encounter (Signed)
Requested Prescriptions  Pending Prescriptions Disp Refills  . glipiZIDE (GLUCOTROL) 10 MG tablet [Pharmacy Med Name: GLIPIZIDE 10 MG TABLET] 90 tablet 1    Sig: TAKE 1 TABLET (10 MG TOTAL) BY MOUTH DAILY BEFORE BREAKFAST.     Endocrinology:  Diabetes - Sulfonylureas Failed - 12/07/2019  9:23 AM      Failed - HBA1C is between 0 and 7.9 and within 180 days    Hemoglobin A1C  Date Value Ref Range Status  11/19/2019 9.6 (A) 4.0 - 5.6 % Final  07/13/2016 8.5  Final   Hgb A1c MFr Bld  Date Value Ref Range Status  06/17/2019 11.8 (H) 4.8 - 5.6 % Final    Comment:             Prediabetes: 5.7 - 6.4          Diabetes: >6.4          Glycemic control for adults with diabetes: <7.0          Passed - Valid encounter within last 6 months    Recent Outpatient Visits          1 week ago Acute non-recurrent pansinusitis   [facility] [place], [place], [name]   2 weeks ago Uncontrolled type 2 diabetes mellitus with diabetic neuropathy, without long-term current use of insulin [facility])   [facility] [name], MD   1 month ago Wound cellulitis   [facility] [place], [name]   2 months ago Diarrhea of presumed infectious origin   [place], Clearnce Sorrel, PA-C   5 months ago Medicare annual wellness visit, subsequent   [facility] [name], MD      Future Appointments            In 5 days Fisher, Kirstie Peri, MD [facility], [place]

## 2019-12-09 NOTE — Progress Notes (Deleted)
Established patient visit   Patient: Katie Buckley   DOB: 07/13/1950   69 y.o. Female  MRN: [id number] Visit Date: 12/12/2019  Today's healthcare provider: Lelon Huh, MD   No chief complaint on file.  Subjective    HPI Follow up for frequent falls:  The patient was last seen for this 3 weeks ago. Changes made at last visit include putting valsartan on hold due to patient having some dizziness and orthostasis.  She reports {excellent/good/fair/poor:19665} compliance with treatment. She feels that condition is {improved/worse/unchanged:3041574}. She {is/is not:21021397} having side effects. ***  -----------------------------------------------------------------------------------------  Hypertension, follow-up  BP Readings from Last 3 Encounters:  11/19/19 118/62  10/10/19 (!) 157/77  06/17/19 (!) 154/62   Wt Readings from Last 3 Encounters:  11/19/19 140 lb (63.5 kg)  10/10/19 142 lb 3.2 oz (64.5 kg)  06/17/19 147 lb (66.7 kg)     She was last seen for hypertension 3 weeks ago.  BP at that visit was 118/62. Management since that visit includes stopping Valsartan for now.  She reports {excellent/good/fair/poor:19665} compliance with treatment. She {is/is not:9024} having side effects. {document side effects if present:1} She is following a {diet:21022986} diet. She {is/is not:9024} exercising. She {does/does not:200015} smoke.  Use of agents associated with hypertension: none.   Outside blood pressures are {***enter patient reported home BP readings, or 'not being checked':1}. Symptoms: {Yes/No:20286} chest pain {Yes/No:20286} chest pressure  {Yes/No:20286} palpitations {Yes/No:20286} syncope  {Yes/No:20286} dyspnea {Yes/No:20286} orthopnea  {Yes/No:20286} paroxysmal nocturnal dyspnea {Yes/No:20286} lower extremity edema   Pertinent labs: Lab Results  Component Value Date   CHOL 229 (H) 11/19/2019   HDL 95 11/19/2019   LDLCALC 104 (H) 11/19/2019    TRIG 178 (H) 11/19/2019   CHOLHDL 4.1 06/17/2019   Lab Results  Component Value Date   NA 139 11/19/2019   K 3.7 11/19/2019   CREATININE 0.79 11/19/2019   GFRNONAA 77 11/19/2019   GFRAA 89 11/19/2019   GLUCOSE 180 (H) 11/19/2019     The 10-year ASCVD risk score Mikey Bussing DC Jr., et al., 2013) is: 14.7%   ---------------------------------------------------------------------------------------------------   {Show patient history (optional):23778::" "}   Medications: Outpatient Medications Prior to Visit  Medication Sig  . amoxicillin (AMOXIL) 875 MG tablet Take 1 tablet (875 mg total) by mouth 2 (two) times daily.  . ARIPiprazole (ABILIFY) 5 MG tablet Take 5 mg by mouth daily. (Patient not taking: Reported on 11/28/2019)  . Biotin 10 MG CAPS Take 10 mg by mouth daily after lunch.  Marland Kitchen buPROPion (WELLBUTRIN SR) 150 MG 12 hr tablet Take 150 mg by mouth 2 (two) times daily. Morning & 3pm (Patient not taking: Reported on 11/28/2019)  . butalbital-acetaminophen-caffeine (FIORICET) 50-325-40 MG tablet TAKE 1 TABLET BY MOUTH EVERY 6 (SIX) HOURS AS NEEDED FOR HEADACHE.  Marland Kitchen Cholecalciferol (VITAMIN D3 PO) Take 1 tablet by mouth daily with lunch.  . esomeprazole (NEXIUM) 20 MG capsule Take 40 mg by mouth daily before breakfast.   . ezetimibe (ZETIA) 10 MG tablet TAKE 1 TABLET BY MOUTH EVERYDAY AT BEDTIME  . fexofenadine (ALLEGRA) 180 MG tablet Take 180 mg by mouth daily as needed for allergies or rhinitis.  . fluticasone (FLONASE) 50 MCG/ACT nasal spray Place 2 sprays into both nostrils daily as needed for allergies.  Marland Kitchen gabapentin (NEURONTIN) 300 MG capsule Take 2 capsules (600 mg total) by mouth 2 (two) times daily. Morning & supper  . glipiZIDE (GLUCOTROL) 10 MG tablet TAKE 1 TABLET (10 MG TOTAL) BY MOUTH DAILY  BEFORE BREAKFAST.  . hydrOXYzine (ATARAX/VISTARIL) 50 MG tablet Take 50 mg by mouth 2 (two) times daily. Morning & 3pm  . JARDIANCE 10 MG TABS tablet TAKE 10 MG BY MOUTH DAILY.  Marland Kitchen ketotifen  (ZADITOR) 0.025 % ophthalmic solution Place 1 drop into both eyes 2 (two) times daily as needed (dry/irritated eyes.).  Marland Kitchen Lancet Devices MISC   . meloxicam (MOBIC) 15 MG tablet Take 1 tablet (15 mg total) by mouth daily. (Patient taking differently: Take 15 mg by mouth daily as needed (pain.). )  . metFORMIN (GLUCOPHAGE-XR) 500 MG 24 hr tablet TAKE 1 TABLET BY MOUTH EVERY DAY WITH BREAKFAST  . metoprolol-hydrochlorothiazide (LOPRESSOR HCT) 100-25 MG tablet TAKE 1/2 TABLET BY MOUTH EVERY DAY  . montelukast (SINGULAIR) 10 MG tablet Take 1 tablet (10 mg total) by mouth daily.  . ondansetron (ZOFRAN) 4 MG tablet Take 1 tablet (4 mg total) by mouth every 8 (eight) hours as needed.  . rosuvastatin (CRESTOR) 20 MG tablet Take 1 tablet (20 mg total) by mouth daily.  . Semaglutide, 1 MG/DOSE, (OZEMPIC, 1 MG/DOSE,) 2 MG/1.5ML SOPN Inject 0.75 mLs (1 mg total) into the skin once a week.  . sertraline (ZOLOFT) 100 MG tablet Take 200 mg by mouth daily.   . traZODone (DESYREL) 50 MG tablet Take 50-100 mg by mouth at bedtime as needed for sleep.   . valACYclovir (VALTREX) 1000 MG tablet Take 1 tablet daily for 5 days as needed (Patient not taking: Reported on 11/19/2019)   No facility-administered medications prior to visit.    Review of Systems  Constitutional: Negative for appetite change, chills, fatigue and fever.  Respiratory: Negative for chest tightness and shortness of breath.   Cardiovascular: Negative for chest pain and palpitations.  Gastrointestinal: Negative for abdominal pain, nausea and vomiting.  Neurological: Negative for dizziness and weakness.    {Heme  Chem  Endocrine  Serology  Results Review (optional):23779::" "}  Objective    There were no vitals taken for this visit. {Show previous vital signs (optional):23777::" "}  Physical Exam  ***  No results found for any visits on 12/12/19.  Assessment & Plan     ***  No follow-ups on file.      {provider  attestation***:1}   Lelon Huh, MD  [facility] [phone] (phone) [phone] (fax)  [place]

## 2019-12-10 DIAGNOSIS — H02831 Dermatochalasis of right upper eyelid: Secondary | ICD-10-CM | POA: Diagnosis not present

## 2019-12-12 ENCOUNTER — Ambulatory Visit: Payer: Self-pay | Admitting: Family Medicine

## 2019-12-13 ENCOUNTER — Other Ambulatory Visit: Payer: Self-pay | Admitting: Family Medicine

## 2019-12-13 NOTE — Telephone Encounter (Signed)
Requested Prescriptions  Pending Prescriptions Disp Refills  . metoprolol-hydrochlorothiazide (LOPRESSOR HCT) 100-25 MG tablet [Pharmacy Med Name: METOPROLOL-HCTZ 100-25 MG TAB] 45 tablet 0    Sig: TAKE 1/2 TABLET BY MOUTH EVERY DAY     Cardiovascular: Beta Blocker + Diuretic Combos Passed - 12/13/2019  9:37 AM      Passed - K in normal range and within 180 days    Potassium  Date Value Ref Range Status  11/19/2019 3.7 3.5 - 5.2 mmol/L Final  02/08/2014 3.7 3.5 - 5.1 mmol/L Final         Passed - Na in normal range and within 180 days    Sodium  Date Value Ref Range Status  11/19/2019 139 134 - 144 mmol/L Final  02/08/2014 138 136 - 145 mmol/L Final         Passed - Cr in normal range and within 180 days    Creat  Date Value Ref Range Status  05/01/2017 0.61 0.50 - 0.99 mg/dL Final    Comment:    For patients >39 years of age, the reference limit for Creatinine is approximately 13% higher for people identified as African-American. .    Creatinine, Ser  Date Value Ref Range Status  11/19/2019 0.79 0.57 - 1.00 mg/dL Final         Passed - Ca in normal range and within 180 days    Calcium  Date Value Ref Range Status  11/19/2019 9.7 8.7 - 10.3 mg/dL Final   Calcium, Total  Date Value Ref Range Status  02/08/2014 9.0 8.5 - 10.1 mg/dL Final         Passed - Patient is not pregnant      Passed - Last BP in normal range    BP Readings from Last 1 Encounters:  11/19/19 118/62         Passed - Last Heart Rate in normal range    Pulse Readings from Last 1 Encounters:  11/19/19 77         Passed - Valid encounter within last 6 months    Recent Outpatient Visits          2 weeks ago Acute non-recurrent pansinusitis   [facility] [place], [place], [name]   3 weeks ago Uncontrolled type 2 diabetes mellitus with diabetic neuropathy, without long-term current use of insulin [facility])   [facility] [name], MD   2 months ago Wound  cellulitis   [place], [name]   2 months ago Diarrhea of presumed infectious origin   [place], Clearnce Sorrel, PA-C   5 months ago Medicare annual wellness visit, subsequent   [facility] [name], MD

## 2019-12-16 ENCOUNTER — Other Ambulatory Visit: Payer: Self-pay | Admitting: Family Medicine

## 2019-12-16 NOTE — Telephone Encounter (Signed)
Requested medication (s) are due for refill today:yes  Requested medication (s) are on the active medication list: yes  Last refill: 11/06/2019  #30  0 refills  Future visit scheduled  yes 12/19/2019  Notes to clinic: not delegated  Requested Prescriptions  Pending Prescriptions Disp Refills   butalbital-acetaminophen-caffeine (FIORICET) 50-325-40 MG tablet [Pharmacy Med Name: BUTALB-ACETAMIN-CAFF 50-325-40] 30 tablet     Sig: TAKE 1 TABLET BY MOUTH EVERY 6 (SIX) HOURS AS NEEDED FOR HEADACHE.      Not Delegated - Analgesics:  Non-Opioid Analgesic Combinations Failed - 12/16/2019  5:46 PM      Failed - This refill cannot be delegated      Passed - Valid encounter within last 12 months    Recent Outpatient Visits           2 weeks ago Acute non-recurrent pansinusitis   [facility] [place], [place], [name]   3 weeks ago Uncontrolled type 2 diabetes mellitus with diabetic neuropathy, without long-term current use of insulin [facility])   [facility] [name], MD   2 months ago Wound cellulitis   [place], [name]   2 months ago Diarrhea of presumed infectious origin   [facility], Clearnce Sorrel, PA-C   6 months ago Medicare annual wellness visit, subsequent   [facility] [name], MD

## 2019-12-17 DIAGNOSIS — H903 Sensorineural hearing loss, bilateral: Secondary | ICD-10-CM | POA: Diagnosis not present

## 2019-12-17 DIAGNOSIS — R42 Dizziness and giddiness: Secondary | ICD-10-CM | POA: Diagnosis not present

## 2019-12-19 ENCOUNTER — Ambulatory Visit: Payer: PPO

## 2019-12-19 ENCOUNTER — Telehealth: Payer: PPO | Admitting: Pharmacist

## 2019-12-19 ENCOUNTER — Other Ambulatory Visit: Payer: Self-pay

## 2019-12-19 NOTE — Chronic Care Management (AMB) (Signed)
Chronic Care Management Pharmacy  Name: NAKEETA SEBASTIANI  MRN: [id number] DOB: [birth year]  Chief Complaint/ HPI  Rhona Leavens Koble,  69 y.o. , female presents for their Initial CCM visit with the clinical pharmacist via telephone due to COVID-19 Pandemic.  PCP : [name], MD  Their chronic conditions include: NA  Office Visits: 6/18 sinusitis, [place], possible thrush, Diflucan '150mg'$  x 1 6/9/ DM, [place], BP 118/62 P 77 Wt 140 BMI 27.3, orthostasis neg, d/c valsartan, statin intolerant, N/V Trulicity, change to Ozempic  Consult Visit: NA  Medications: Outpatient Encounter Medications as of 12/19/2019  Medication Sig Note  . Biotin 10 MG CAPS Take 10 mg by mouth daily after lunch.   Marland Kitchen buPROPion (WELLBUTRIN SR) 150 MG 12 hr tablet Take 150 mg by mouth 2 (two) times daily. Morning & 3pm 11/19/2019: Patient thinks this was changed to Cymbalta  . butalbital-acetaminophen-caffeine (FIORICET) 50-325-40 MG tablet TAKE 1 TABLET BY MOUTH EVERY 6 (SIX) HOURS AS NEEDED FOR HEADACHE.   Marland Kitchen Cholecalciferol (VITAMIN D3 PO) Take 1,000 Units by mouth daily with lunch.    . ezetimibe (ZETIA) 10 MG tablet TAKE 1 TABLET BY MOUTH EVERYDAY AT BEDTIME   . fexofenadine (ALLEGRA) 180 MG tablet Take 180 mg by mouth daily as needed for allergies or rhinitis.   . fluticasone (FLONASE) 50 MCG/ACT nasal spray PLACE 2 SPRAYS INTO BOTH NOSTRILS DAILY AS NEEDED FOR ALLERGIES.   Marland Kitchen gabapentin (NEURONTIN) 300 MG capsule Take 2 capsules (600 mg total) by mouth 2 (two) times daily. Morning & supper   . glipiZIDE (GLUCOTROL) 10 MG tablet TAKE 1 TABLET (10 MG TOTAL) BY MOUTH DAILY BEFORE BREAKFAST.   . hydrOXYzine (ATARAX/VISTARIL) 50 MG tablet Take 50 mg by mouth 2 (two) times daily.    Marland Kitchen JARDIANCE 10 MG TABS tablet TAKE 10 MG BY MOUTH DAILY.   Marland Kitchen Lancet Devices MISC    . meloxicam (MOBIC) 15 MG tablet Take 1 tablet (15 mg total) by mouth daily. (Patient taking differently: Take 15 mg by mouth daily as needed (pain.).  )   . metFORMIN (GLUCOPHAGE-XR) 500 MG 24 hr tablet TAKE 1 TABLET BY MOUTH EVERY DAY WITH BREAKFAST   . metoprolol-hydrochlorothiazide (LOPRESSOR HCT) 100-25 MG tablet TAKE 1/2 TABLET BY MOUTH EVERY DAY   . montelukast (SINGULAIR) 10 MG tablet Take 1 tablet (10 mg total) by mouth daily.   . ondansetron (ZOFRAN) 4 MG tablet Take 1 tablet (4 mg total) by mouth every 8 (eight) hours as needed.   . rosuvastatin (CRESTOR) 20 MG tablet Take 1 tablet (20 mg total) by mouth daily.   . Semaglutide, 1 MG/DOSE, (OZEMPIC, 1 MG/DOSE,) 2 MG/1.5ML SOPN Inject 0.75 mLs (1 mg total) into the skin once a week.   . sertraline (ZOLOFT) 100 MG tablet Take 200 mg by mouth daily.    . traZODone (DESYREL) 50 MG tablet Take 50-100 mg by mouth at bedtime as needed for sleep.    . [DISCONTINUED] fluticasone (FLONASE) 50 MCG/ACT nasal spray Place 2 sprays into both nostrils daily as needed for allergies.   Marland Kitchen amoxicillin (AMOXIL) 875 MG tablet Take 1 tablet (875 mg total) by mouth 2 (two) times daily. (Patient not taking: Reported on 12/19/2019)   . ARIPiprazole (ABILIFY) 5 MG tablet Take 5 mg by mouth daily. (Patient not taking: Reported on 11/28/2019)   . esomeprazole (NEXIUM) 20 MG capsule Take 40 mg by mouth daily before breakfast.    . ketotifen (ZADITOR) 0.025 % ophthalmic solution Place  1 drop into both eyes 2 (two) times daily as needed (dry/irritated eyes.).   Marland Kitchen valACYclovir (VALTREX) 1000 MG tablet Take 1 tablet daily for 5 days as needed (Patient not taking: Reported on 11/19/2019)    No facility-administered encounter medications on file as of 12/19/2019.      Financial Resource Strain: High Risk  . Difficulty of Paying Living Expenses: Hard    Current Diagnosis/Assessment:  Goals Addressed            This Visit's Progress   . Chronic Care Management       CARE PLAN ENTRY (see longitudinal plan of care for additional care plan information)  Current Barriers:  . Chronic Disease Management support,  education, and care coordination needs related to Hypertension, Hyperlipidemia, and Diabetes   Hypertension BP Readings from Last 3 Encounters:  11/19/19 118/62  10/10/19 (!) 157/77  06/17/19 (!) 154/62   . Pharmacist Clinical Goal(s): o Over the next 90 days, patient will work with PharmD and providers to maintain BP goal <130/80 . Current regimen:  o Metoprolol-hydrochlorothiazide 100-25 mg 1/2 tab daily . Interventions: o None . Patient self care activities - Over the next 90 days, patient will: o Check BP daily, document, and provide at future appointments o Ensure daily salt intake < 2300 mg/day  Hyperlipidemia Lab Results  Component Value Date/Time   LDLCALC 104 (H) 11/19/2019 09:58 AM   LDLCALC 104 (H) 05/01/2017 08:42 AM   LDLCALC 125 (H) 03/28/2013 04:19 AM   . Pharmacist Clinical Goal(s): o Over the next 90 days, patient will work with PharmD and providers to achieve LDL goal < 70 . Current regimen:  o Zetia '10mg'$  daily o Crestor '20mg'$  daily . Interventions: o Recommend increase rosuvastatin '40mg'$  daily . Patient self care activities - Over the next 90 days, patient will: o Work with provider/PharmD to increase Crestor to maximum dose  Diabetes Lab Results  Component Value Date/Time   HGBA1C 9.6 (A) 11/19/2019 10:15 AM   HGBA1C 11.8 (H) 06/17/2019 03:44 PM   HGBA1C 11.2 (A) 11/13/2017 04:45 PM   HGBA1C 8.5 07/13/2016 12:00 AM   HGBA1C 8.1 (A) 07/22/2014 12:00 AM   HGBA1C 9.2 (H) 03/27/2013 06:21 PM   . Pharmacist Clinical Goal(s): o Over the next 90 days, patient will work with PharmD and providers to achieve A1c goal <7% . Current regimen:  o Metformin '500mg'$  daily o Ozempic 0.'75mg'$  inject weekly o Jardiance '10mg'$  daily . Interventions: o Recommend increase metformin '500mg'$  twice daily o Recommend increase Ozempic 1.'5mg'$  inj weekly . Patient self care activities - Over the next 90 days, patient will: o Check blood sugar once daily, document, and provide at  future appointments o Contact provider with any episodes of hypoglycemia  Medication management . Pharmacist Clinical Goal(s): o Over the next 90 days, patient will work with PharmD and providers to maintain optimal medication adherence . [facility] . Interventions o Comprehensive medication review performed. o Continue current medication management strategy . Patient self care activities - Over the next 90 days, patient will: o Focus on medication adherence by decreasing anticholinergic burden by reducing use of medications such as hydroxyzine and Fioricet whenever possible o Take medications as prescribed o Report any questions or concerns to PharmD and/or provider(s)  Initial goal documentation       Hypertension    Office blood pressures are  BP Readings from Last 3 Encounters:  11/19/19 118/62  10/10/19 (!) 157/77  06/17/19 (!) 154/62  Patient has failed these meds in the past: NA  Patient checks BP at home daily  Patient home BP readings are ranging: 124/80  We discussed  Historically 139/84 Valsartan d/c due to orthostasis  Plan  Continue current medications   Diabetes   Recent Relevant Labs: Lab Results  Component Value Date/Time   HGBA1C 9.6 (A) 11/19/2019 10:15 AM   HGBA1C 11.8 (H) 06/17/2019 03:44 PM   HGBA1C 11.2 (A) 11/13/2017 04:45 PM   HGBA1C 8.5 07/13/2016 12:00 AM   HGBA1C 8.1 (A) 07/22/2014 12:00 AM   HGBA1C 9.2 (H) 03/27/2013 06:21 PM     Checking BG: Daily  Recent FBG Readings: 118 Patient is currently uncontrolled on the following medications: Jardiance, Ozempic, metformin  Last diabetic Foot exam:  Lab Results  Component Value Date/Time   HMDIABEYEEXA No Retinopathy 06/19/2018 12:00 AM    Last diabetic Eye exam: No results found for: HMDIABFOOTEX   We discussed:  No retinopathy Take FSBG at different times of day. Home readings don't match A1c. Low dose metformin  Plan  Recommend increase metformin '500mg'$  1  tab twice daily with food  Recommend increase Ozempic 1.'5mg'$  inj once weekly  Hyperlipidemia   LDL goal < 70  Lipid Panel     Component Value Date/Time   CHOL 229 (H) 11/19/2019 0958   CHOL 244 (H) 03/28/2013 0419   TRIG 178 (H) 11/19/2019 0958   TRIG 200 03/28/2013 0419   HDL 95 11/19/2019 0958   HDL 79 (H) 03/28/2013 0419   LDLCALC 104 (H) 11/19/2019 0958   LDLCALC 104 (H) 05/01/2017 0842   LDLCALC 125 (H) 03/28/2013 0419    Hepatic Function Latest Ref Rng & Units 11/19/2019 06/17/2019 12/26/2017  Total Protein 6.0 - 8.5 g/dL 6.6 5.9(L) -  Albumin 3.8 - 4.8 g/dL 4.4 4.0 4.4  AST 0 - 40 IU/L 25 16 -  ALT 0 - 32 IU/L 21 10 -  Alk Phosphatase 48 - 121 IU/L 102 131(H) -  Total Bilirubin 0.0 - 1.2 mg/dL 0.3 <0.2 -     The 10-year ASCVD risk score Mikey Bussing DC Jr., et al., 2013) is: 14.7%   Values used to calculate the score:     Age: 76 years     Sex: Female     Is Non-Hispanic African American: No     Diabetic: Yes     Tobacco smoker: No     Systolic Blood Pressure: 409 mmHg     Is BP treated: Yes     HDL Cholesterol: 95 mg/dL     Total Cholesterol: 229 mg/dL   Patient has failed these meds in past: NA Patient is currently uncontrolled on the following medications:  Chauncey Cruel '10mg'$  daily . Crestor '20mg'$  daily  We discussed:   Maybe increase Crestor to '40mg'$   Plan  Recommend increase rosuvastatin '40mg'$  daily  Medication Management   [facility] Uses pill box? Yes Pt endorses 90% compliance Patient at work lunch break, couldn't get to pharmacy change topic  We discussed:  Didn't know valsartan was d/c Fioricet monthly maybe 2 days  Nexium '40mg'$   Allegra 2 - 3 months (4 - 5 days) Weak/wobbly out of [place] Is Jardiance causing mouth infection, dryness? Hydroxyzine is very anticholinergic and could be contributing to falls  Plan  Decrease hydryoxyzine '25mg'$  twice daily Continue current medication management strategy  Follow up: 1 month  phone visit due to incomplete initial visit  Milus Height, PharmD, BCGP, CTTS Clinical Pharmacist  [employer] [phone]

## 2019-12-21 ENCOUNTER — Other Ambulatory Visit: Payer: Self-pay | Admitting: Family Medicine

## 2019-12-21 DIAGNOSIS — J0110 Acute frontal sinusitis, unspecified: Secondary | ICD-10-CM

## 2019-12-21 NOTE — Telephone Encounter (Signed)
Requested Prescriptions  Pending Prescriptions Disp Refills  . fluticasone (FLONASE) 50 MCG/ACT nasal spray [Pharmacy Med Name: FLUTICASONE PROP 50 MCG SPRAY] 48 mL 3    Sig: PLACE 2 SPRAYS INTO BOTH NOSTRILS DAILY AS NEEDED FOR ALLERGIES.     Ear, Nose, and Throat: Nasal Preparations - Corticosteroids Passed - 12/21/2019 11:41 AM      Passed - Valid encounter within last 12 months    Recent Outpatient Visits          3 weeks ago Acute non-recurrent pansinusitis   [facility] [place], [place], [name]   1 month ago Uncontrolled type 2 diabetes mellitus with diabetic neuropathy, without long-term current use of insulin [facility])   [facility] [name], MD   2 months ago Wound cellulitis   [place], [name]   2 months ago Diarrhea of presumed infectious origin   [facility], Clearnce Sorrel, PA-C   6 months ago Medicare annual wellness visit, subsequent   [facility] [name], MD

## 2019-12-21 NOTE — Patient Instructions (Addendum)
Visit Information  Goals Addressed            This Visit's Progress   . Chronic Care Management       CARE PLAN ENTRY (see longitudinal plan of care for additional care plan information)  Current Barriers:  . Chronic Disease Management support, education, and care coordination needs related to Hypertension, Hyperlipidemia, and Diabetes   Hypertension BP Readings from Last 3 Encounters:  11/19/19 118/62  10/10/19 (!) 157/77  06/17/19 (!) 154/62   . Pharmacist Clinical Goal(s): o Over the next 90 days, patient will work with PharmD and providers to maintain BP goal <130/80 . Current regimen:  o Metoprolol-hydrochlorothiazide 100-25 mg 1/2 tab daily . Interventions: o None . Patient self care activities - Over the next 90 days, patient will: o Check BP daily, document, and provide at future appointments o Ensure daily salt intake < 2300 mg/day  Hyperlipidemia Lab Results  Component Value Date/Time   LDLCALC 104 (H) 11/19/2019 09:58 AM   LDLCALC 104 (H) 05/01/2017 08:42 AM   LDLCALC 125 (H) 03/28/2013 04:19 AM   . Pharmacist Clinical Goal(s): o Over the next 90 days, patient will work with PharmD and providers to achieve LDL goal < 70 . Current regimen:  o Zetia '10mg'$  daily o Crestor '20mg'$  daily . Interventions: o Recommend increase rosuvastatin '40mg'$  daily . Patient self care activities - Over the next 90 days, patient will: o Work with provider/PharmD to increase Crestor to maximum dose  Diabetes Lab Results  Component Value Date/Time   HGBA1C 9.6 (A) 11/19/2019 10:15 AM   HGBA1C 11.8 (H) 06/17/2019 03:44 PM   HGBA1C 11.2 (A) 11/13/2017 04:45 PM   HGBA1C 8.5 07/13/2016 12:00 AM   HGBA1C 8.1 (A) 07/22/2014 12:00 AM   HGBA1C 9.2 (H) 03/27/2013 06:21 PM   . Pharmacist Clinical Goal(s): o Over the next 90 days, patient will work with PharmD and providers to achieve A1c goal <7% . Current regimen:  o Metformin '500mg'$  daily o Ozempic 0.'75mg'$  inject weekly o Jardiance  '10mg'$  daily . Interventions: o Recommend increase metformin '500mg'$  twice daily o Recommend increase Ozempic 1.'5mg'$  inj weekly . Patient self care activities - Over the next 90 days, patient will: o Check blood sugar once daily, document, and provide at future appointments o Contact provider with any episodes of hypoglycemia  Medication management . Pharmacist Clinical Goal(s): o Over the next 90 days, patient will work with PharmD and providers to maintain optimal medication adherence . [facility] . Interventions o Comprehensive medication review performed. o Continue current medication management strategy . Patient self care activities - Over the next 90 days, patient will: o Focus on medication adherence by decreasing anticholinergic burden by reducing use of medications such as hydroxyzine and Fioricet whenever possible o Take medications as prescribed o Report any questions or concerns to PharmD and/or provider(s)  Initial goal documentation        Ms. Loisel was given information about Chronic Care Management services today including:  1. CCM service includes personalized support from designated clinical staff supervised by her physician, including individualized plan of care and coordination with other care providers 2. 24/7 contact phone numbers for assistance for urgent and routine care needs. 3. Standard insurance, coinsurance, copays and deductibles apply for chronic care management only during months in which we provide at least 20 minutes of these services. Most insurances cover these services at 100%, however patients may be responsible for any copay, coinsurance and/or deductible if applicable. This  service may help you avoid the need for more expensive face-to-face services. 4. Only one practitioner may furnish and bill the service in a calendar month. 5. The patient may stop CCM services at any time (effective at the end of the month) by phone call to the office  staff.  Patient agreed to services and verbal consent obtained.   Print copy of patient instructions provided.  Telephone follow up appointment with pharmacy team member scheduled for: 1 month due to incomplete visit  Milus Height, PharmD, BCGP, [place] [phone]   Understanding Your Risk for Falls Each year, millions of people have serious injuries from falls. It is important to understand your risk for falling. Talk with your health care provider about your risk and what you can do to lower it. There are actions you can take at home to lower your risk. If you do have a serious fall, make sure you tell your health care provider. Falling once raises your risk for falling again. How can falls affect me? Serious injuries from falls are common. These include:  Broken bones, such as hip fractures.  Head injuries, such as traumatic brain injuries (TBI). Fear of falling can also cause you to avoid activities and stay at home. This can make your muscles weaker and actually raise your risk for a fall. What can increase my risk? There are a number of risk factors that increase your risk for falling. The more risk factors you have, the higher your risk for falling. Serious injuries from a fall most often happen to people older than age 35. Children and young adults ages 28-29 are also at higher risk. Common risk factors include:  Weakness in the lower body.  Lack (deficiency) of vitamin D.  Being generally weak or confused due to long-term (chronic) illness.  Dizziness or balance problems.  Poor vision.  Medicines that cause dizziness or drowsiness. These can include medicines for your blood pressure, heart, anxiety, insomnia, or edema, as well as pain medicines and muscle relaxants. Other risk factors include:  Drinking alcohol.  Having had a fall in the past.  Having depression.  Foot pain or improper footwear.  Working at a  dangerous job.  Having any of the following in your home: ? Tripping hazards, such as floor clutter or loose rugs. ? Poor lighting. ? Pets or clutter.  Dementia or memory loss. What actions can I take to lower my risk of falling?     Physical activity Maintain physical fitness. Do strength and balance exercises. Consider taking a regular class to build strength and balance. Yoga and tai chi are good options. Vision Have your eyes checked every year and your vision prescription updated as needed. Walking aids and footwear  Wear nonskid shoes. Do not wear high heels.  Do not walk around the house in socks or slippers.  Use a cane or walker as told by your health care provider. Home safety  Attach secure railings on both sides of your stairs.  Install grab bars for your tub, shower, and toilet. Use a bath mat in your tub or shower.  Use good lighting in all rooms. Keep a flashlight near your bed.  Make sure there is a clear path from your bed to the bathroom. Use night-lights.  Do not use throw rugs. Make sure all carpeting is taped or tacked down securely.  Remove all clutter from walkways and stairways, including extension cords.  Repair uneven or broken steps.  Avoid  walking on icy or slippery surfaces. Walk on the grass instead of on icy or slick sidewalks. Where you can, use ice melt to get rid of ice on walkways.  Use a cordless phone. Questions to ask your health care provider  Can you help me check my risk for a fall?  Do any of my medicines make me more likely to fall?  Should I take a vitamin D supplement?  What exercises can I do to improve my strength and balance?  Should I make an appointment to have my vision checked?  Do I need a bone density test to check for weak bones or osteoporosis?  Would it help to use a cane or a walker? Where to find more information  Centers for Disease Control and Prevention, STEADI: http://www.wolf.info/  Community-Based Fall  Prevention Programs: http://www.wolf.info/  National Institute on Aging: ToneConnect.com.ee Contact a health care provider if:  You fall at home.  You are afraid of falling at home.  You feel weak, drowsy, or dizzy. Summary  People 48 and older are at high risk for falling. However, older people are not the only ones injured in falls. Children and young adults have a higher-than-normal risk too.  Talk with your health care provider about your risks for falling and how to lower those risks.  Taking certain precautions at home can lower your risk for falling.  If you fall, always tell your health care provider. This information is not intended to replace advice given to you by your health care provider. Make sure you discuss any questions you have with your health care provider. Document Revised: 12/04/2018 Document Reviewed: 12/04/2018 Elsevier Patient Education  [place].

## 2019-12-30 ENCOUNTER — Other Ambulatory Visit: Payer: Self-pay | Admitting: Family Medicine

## 2020-01-01 ENCOUNTER — Telehealth: Payer: Self-pay | Admitting: Family Medicine

## 2020-01-01 NOTE — Chronic Care Management (AMB) (Signed)
Care Management   Note  01/01/2020 Name: Katie Buckley MRN: [id number] DOB: 02/09/1951  Katie Buckley is a 69 y.o. year old female who is a primary care patient of Sherrie Mustache, Demetrios Isaacs, MD and is actively engaged with the care management team. I reached out to [name] by phone today to assist with re-scheduling an initial visit with the Pharmacist  Follow up plan: Unsuccessful telephone outreach attempt made. A HIPPA compliant phone message was left for the patient providing contact information and requesting a return call.  The care management team will reach out to the patient again over the next 7 days.  If patient returns call to provider office, please advise to call Embedded Care Management Care Guide Penne Lash  at [phone]  Penne Lash, RMA Care Guide, Embedded Care Coordination [facility]  [place], [place] [address] Direct Dial: [phone] [email or web address]: [email or web address]

## 2020-01-01 NOTE — Chronic Care Management (AMB) (Signed)
Care Management   Note  01/01/2020 Name: Katie Buckley MRN: [id number] DOB: [birth year]  Katie Buckley is a 69 y.o. year old female who is a primary care patient of Sherrie Mustache, Demetrios Isaacs, MD and is actively engaged with the care management team. I reached out to [name] by phone today to assist with re-scheduling a follow up visit with the Pharmacist  Follow up plan: Telephone appointment with care management team member scheduled for: Pharm D 01/30/2020  Katie Buckley, RMA Care Guide, Embedded Care Coordination [facility]  [place], [place] [address] Direct Dial: [phone] [email or web address]: [email or web address]

## 2020-01-02 DIAGNOSIS — R42 Dizziness and giddiness: Secondary | ICD-10-CM | POA: Diagnosis not present

## 2020-01-04 ENCOUNTER — Other Ambulatory Visit: Payer: Self-pay | Admitting: Family Medicine

## 2020-01-04 DIAGNOSIS — R110 Nausea: Secondary | ICD-10-CM

## 2020-01-05 DIAGNOSIS — F411 Generalized anxiety disorder: Secondary | ICD-10-CM | POA: Diagnosis not present

## 2020-01-05 DIAGNOSIS — G4700 Insomnia, unspecified: Secondary | ICD-10-CM | POA: Diagnosis not present

## 2020-01-05 DIAGNOSIS — F331 Major depressive disorder, recurrent, moderate: Secondary | ICD-10-CM | POA: Diagnosis not present

## 2020-01-05 NOTE — Telephone Encounter (Signed)
Requested medication (s) are due for refill today: no  Requested medication (s) are on the active medication list: yes  Last refill:  11/22/2019  Future visit scheduled: yes  Notes to clinic:  this refill cannot be delegated    Requested Prescriptions  Pending Prescriptions Disp Refills   ondansetron (ZOFRAN) 4 MG tablet [Pharmacy Med Name: ONDANSETRON HCL 4 MG TABLET] 20 tablet 0    Sig: TAKE 1 TABLET BY MOUTH EVERY 8 HOURS AS NEEDED      Not Delegated - Gastroenterology: Antiemetics Failed - 01/04/2020  7:49 PM      Failed - This refill cannot be delegated      Passed - Valid encounter within last 6 months    Recent Outpatient Visits           1 month ago Acute non-recurrent pansinusitis   [facility] [place], [place], PA-C   1 month ago Uncontrolled type 2 diabetes mellitus with diabetic neuropathy, without long-term current use of insulin [facility])   [facility] [name], MD   2 months ago Wound cellulitis   [place], [name]   3 months ago Diarrhea of presumed infectious origin   [place], Cle[place]ce Sorrel, PA-C   6 months ago Medicare annual wellness visit, subsequent   [facility] [name], MD

## 2020-01-07 DIAGNOSIS — R42 Dizziness and giddiness: Secondary | ICD-10-CM | POA: Diagnosis not present

## 2020-01-09 ENCOUNTER — Telehealth: Payer: Self-pay | Admitting: *Deleted

## 2020-01-09 NOTE — Chronic Care Management (AMB) (Signed)
  Chronic Care Management   Note  01/09/2020 Name: [name] MRN: [id number] DOB: [birth year]  Katie Buckley is a 69 y.o. year old female who is a primary care patient of Caryn Section, Kirstie Peri, MD and is actively engaged with the care management team. I reached out to [place] by phone today to assist with re-scheduling a follow up visit with the Pharmacist.  Follow up plan: Telephone appointment with care management team member scheduled for:02/02/2020  [place], [place] [facility]  [place], [place] [address] Direct Dial: [email or web address]: [email or web address]

## 2020-01-17 ENCOUNTER — Other Ambulatory Visit: Payer: Self-pay | Admitting: Family Medicine

## 2020-01-17 NOTE — Telephone Encounter (Signed)
Requested medication (s) are due for refill today: yes  Requested medication (s) are on the active medication list: yes  Last refill:  12/17/19  Future visit scheduled: no  Notes to clinic:  med not delegated to NT to RF   Requested Prescriptions  Pending Prescriptions Disp Refills   butalbital-acetaminophen-caffeine (FIORICET) 50-325-40 MG tablet [Pharmacy Med Name: BUTALB-ACETAMIN-CAFF 50-325-40] 30 tablet     Sig: TAKE 1 TABLET BY MOUTH EVERY 6 (SIX) HOURS AS NEEDED FOR HEADACHE.      Not Delegated - Analgesics:  Non-Opioid Analgesic Combinations Failed - 01/17/2020  3:56 PM      Failed - This refill cannot be delegated      Passed - Valid encounter within last 12 months    Recent Outpatient Visits           1 month ago Acute non-recurrent pansinusitis   [facility] [place], [place], [name]   1 month ago Uncontrolled type 2 diabetes mellitus with diabetic neuropathy, without long-term current use of insulin [facility])   [facility] [name], MD   3 months ago Wound cellulitis   [place], [name]   3 months ago Diarrhea of presumed infectious origin   [facility], Clearnce Sorrel, PA-C   7 months ago Medicare annual wellness visit, subsequent   [facility] [name], MD

## 2020-01-30 ENCOUNTER — Telehealth: Payer: PPO

## 2020-02-02 ENCOUNTER — Telehealth: Payer: PPO

## 2020-02-03 ENCOUNTER — Telehealth: Payer: Self-pay | Admitting: *Deleted

## 2020-02-03 NOTE — Chronic Care Management (AMB) (Signed)
  Chronic Care Management   Note  02/03/2020 Name: Katie Buckley MRN: [id number] DOB: [birth year]  Katie Buckley is a 69 y.o. year old female who is a primary care patient of Caryn Section, Kirstie Peri, MD and is actively engaged with the care management team. I reached out to [place] by phone today to assist with re-scheduling a follow up visit with the Pharmacist.  Follow up plan: Unsuccessful telephone outreach attempt made. A HIPPA compliant phone message was left for the patient providing contact information and requesting a return call. The care management team will reach out to the patient again over the next 7 days. If patient returns call to provider office, please advise to call [place] at [phone].  [place], [place] [address] Direct Dial: [phone] [email or web address] Website: [email or web address]

## 2020-02-08 ENCOUNTER — Other Ambulatory Visit: Payer: Self-pay | Admitting: Family Medicine

## 2020-02-08 NOTE — Telephone Encounter (Signed)
Requested medication (s) are due for refill today: no  Requested medication (s) are on the active medication list: yes   Last refill:  01/19/2020  Future visit scheduled: no  Notes to clinic:  this refill cannot be delegated    Requested Prescriptions  Pending Prescriptions Disp Refills   butalbital-acetaminophen-caffeine (FIORICET) 50-325-40 MG tablet [Pharmacy Med Name: BUTALB-ACETAMIN-CAFF 50-325-40] 30 tablet     Sig: Take 1 tablet by mouth every 6 (six) hours as needed for headache.      Not Delegated - Analgesics:  Non-Opioid Analgesic Combinations Failed - 02/08/2020 10:04 PM      Failed - This refill cannot be delegated      Passed - Valid encounter within last 12 months    Recent Outpatient Visits           2 months ago Acute non-recurrent pansinusitis   [facility] [place], [place], [name]   2 months ago Uncontrolled type 2 diabetes mellitus with diabetic neuropathy, without long-term current use of insulin [facility])   [facility] [name], MD   4 months ago Wound cellulitis   [place], [name]   4 months ago Diarrhea of presumed infectious origin   [facility], Clearnce Sorrel, PA-C   7 months ago Medicare annual wellness visit, subsequent   [facility] [name], MD

## 2020-02-12 NOTE — Chronic Care Management (AMB) (Signed)
  Care Management   Note  02/12/2020 Name: Katie Buckley MRN: [id number] DOB: 1951-05-30  Katie Buckley is a 70 y.o. year old female who is a primary care patient of Caryn Section, Kirstie Peri, MD and is actively engaged with the care management team. I reached out to [place] by phone today to assist with re-scheduling a follow up visit with the Pharmacist.  Follow up plan: Unsuccessful telephone outreach attempt made. A HIPPA compliant phone message was left for the patient providing contact information and requesting a return call. The care management team will reach out to the patient again over the next 7 days. If patient returns call to provider office, please advise to call [place] at [phone].  [place], [place] [address] Direct Dial: [phone] [email or web address] Website: [email or web address]

## 2020-02-17 ENCOUNTER — Other Ambulatory Visit: Payer: Self-pay | Admitting: Family Medicine

## 2020-02-17 DIAGNOSIS — J301 Allergic rhinitis due to pollen: Secondary | ICD-10-CM

## 2020-02-20 ENCOUNTER — Telehealth: Payer: Self-pay

## 2020-02-20 ENCOUNTER — Ambulatory Visit: Payer: PPO

## 2020-02-20 NOTE — Chronic Care Management (AMB) (Signed)
  Chronic Care Management   Note  02/20/2020 Name: Katie Buckley MRN: [id number] DOB: Sep 21, 1950  Katie Buckley is a 69 y.o. year old female who is a primary care patient of Caryn Section, Kirstie Peri, MD and is actively engaged with the care management team. I reached out to [place] by phone today to assist with re-scheduling a follow up visit with the Pharmacist  Follow up plan: Telephone appointment with care management team member scheduled for: 03/05/2020  [place] Management

## 2020-02-20 NOTE — Telephone Encounter (Signed)
Copied from [place] [id number]. Topic: General - Inquiry >> Feb 19, 2020  3:59 PM Gillis Ends D wrote: Reason for CRM: patient called to reschedule pharmacist call, She would like 02-20-2020 at 8am. I had attempted to schedule the appointment but found out that I am unable to schedule her. I called the patient and explained my error and informed her that someone would be getting in touch with her to reschedule. Please advise the patient.

## 2020-02-23 ENCOUNTER — Other Ambulatory Visit: Payer: Self-pay | Admitting: Family Medicine

## 2020-02-29 ENCOUNTER — Other Ambulatory Visit: Payer: Self-pay | Admitting: Family Medicine

## 2020-02-29 NOTE — Telephone Encounter (Signed)
Requested medication (s) are due for refill today: yes  Requested medication (s) are on the active medication list: yes  Last refill:  02/09/20  Future visit scheduled: no  Notes to clinic:  med not delegated to NT to RF   Requested Prescriptions  Pending Prescriptions Disp Refills   butalbital-acetaminophen-caffeine (FIORICET) 50-325-40 MG tablet [Pharmacy Med Name: BUTALB-ACETAMIN-CAFF 50-325-40] 30 tablet     Sig: TAKE 1 TABLET BY MOUTH EVERY 6 (SIX) HOURS AS NEEDED FOR HEADACHE.      Not Delegated - Analgesics:  Non-Opioid Analgesic Combinations Failed - 02/29/2020  6:40 PM      Failed - This refill cannot be delegated      Passed - Valid encounter within last 12 months    Recent Outpatient Visits           3 months ago Acute non-recurrent pansinusitis   [facility] [place], [place], [name]   3 months ago Uncontrolled type 2 diabetes mellitus with diabetic neuropathy, without long-term current use of insulin [facility])   [facility] [name], MD   4 months ago Wound cellulitis   [place], [name]   5 months ago Diarrhea of presumed infectious origin   [place], Clearnce Sorrel, PA-C   8 months ago Medicare annual wellness visit, subsequent   [facility] [name], MD

## 2020-03-04 DIAGNOSIS — F331 Major depressive disorder, recurrent, moderate: Secondary | ICD-10-CM | POA: Diagnosis not present

## 2020-03-04 DIAGNOSIS — G4700 Insomnia, unspecified: Secondary | ICD-10-CM | POA: Diagnosis not present

## 2020-03-04 DIAGNOSIS — F411 Generalized anxiety disorder: Secondary | ICD-10-CM | POA: Diagnosis not present

## 2020-03-05 ENCOUNTER — Telehealth: Payer: PPO

## 2020-03-05 NOTE — Chronic Care Management (AMB) (Deleted)
Chronic Care Management Pharmacy  Name: CHYLER CREELY  MRN: [id number] DOB: 09-15-1950  Chief Complaint/ HPI  Katie Buckley,  69 y.o. , female presents for their {Initial/Follow-up:3041532} CCM visit with the clinical pharmacist {CHL HP Upstream Pharm visit ENMM:[id number]}.  PCP : [name], MD  Their chronic conditions include: {CHL AMB CHRONIC MEDICAL [phone]}  Office Visits:***  Consult Visit:***  Medications: Outpatient Encounter Medications as of 03/05/2020  Medication Sig Note  . amoxicillin (AMOXIL) 875 MG tablet Take 1 tablet (875 mg total) by mouth 2 (two) times daily. (Patient not taking: Reported on 12/19/2019)   . ARIPiprazole (ABILIFY) 5 MG tablet Take 5 mg by mouth daily. (Patient not taking: Reported on 11/28/2019)   . Biotin 10 MG CAPS Take 10 mg by mouth daily after lunch.   Marland Kitchen buPROPion (WELLBUTRIN SR) 150 MG 12 hr tablet Take 150 mg by mouth 2 (two) times daily. Morning & 3pm 11/19/2019: Patient thinks this was changed to Cymbalta  . butalbital-acetaminophen-caffeine (FIORICET) 50-325-40 MG tablet TAKE 1 TABLET BY MOUTH EVERY 6 (SIX) HOURS AS NEEDED FOR HEADACHE.   Marland Kitchen Cholecalciferol (VITAMIN D3 PO) Take 1,000 Units by mouth daily with lunch.    . esomeprazole (NEXIUM) 20 MG capsule Take 40 mg by mouth daily before breakfast.    . ezetimibe (ZETIA) 10 MG tablet TAKE 1 TABLET BY MOUTH EVERYDAY AT BEDTIME   . fexofenadine (ALLEGRA) 180 MG tablet Take 180 mg by mouth daily as needed for allergies or rhinitis.   . fluticasone (FLONASE) 50 MCG/ACT nasal spray PLACE 2 SPRAYS INTO BOTH NOSTRILS DAILY AS NEEDED FOR ALLERGIES.   Marland Kitchen gabapentin (NEURONTIN) 300 MG capsule Take 2 capsules (600 mg total) by mouth 2 (two) times daily. Morning & supper   . glipiZIDE (GLUCOTROL) 10 MG tablet TAKE 1 TABLET (10 MG TOTAL) BY MOUTH DAILY BEFORE BREAKFAST.   . hydrOXYzine (ATARAX/VISTARIL) 50 MG tablet Take 50 mg by mouth 2 (two) times daily.    Marland Kitchen JARDIANCE 10 MG  TABS tablet TAKE 10 MG BY MOUTH DAILY.   Marland Kitchen ketotifen (ZADITOR) 0.025 % ophthalmic solution Place 1 drop into both eyes 2 (two) times daily as needed (dry/irritated eyes.).   Marland Kitchen Lancet Devices MISC    . meloxicam (MOBIC) 15 MG tablet Take 1 tablet (15 mg total) by mouth daily. (Patient taking differently: Take 15 mg by mouth daily as needed (pain.). )   . metFORMIN (GLUCOPHAGE-XR) 500 MG 24 hr tablet TAKE 1 TABLET BY MOUTH EVERY DAY WITH BREAKFAST   . metoprolol-hydrochlorothiazide (LOPRESSOR HCT) 100-25 MG tablet TAKE 1/2 TABLET BY MOUTH EVERY DAY   . montelukast (SINGULAIR) 10 MG tablet TAKE 1 TABLET BY MOUTH EVERY DAY   . ondansetron (ZOFRAN) 4 MG tablet TAKE 1 TABLET BY MOUTH EVERY 8 HOURS AS NEEDED   . rosuvastatin (CRESTOR) 20 MG tablet Take 1 tablet (20 mg total) by mouth daily.   . Semaglutide, 1 MG/DOSE, (OZEMPIC, 1 MG/DOSE,) 2 MG/1.5ML SOPN Inject 0.75 mLs (1 mg total) into the skin once a week.   . sertraline (ZOLOFT) 100 MG tablet Take 200 mg by mouth daily.    . traZODone (DESYREL) 50 MG tablet Take 50-100 mg by mouth at bedtime as needed for sleep.    . valACYclovir (VALTREX) 1000 MG tablet Take 1 tablet daily for 5 days as needed (Patient not taking: Reported on 11/19/2019)    No facility-administered encounter medications on file as of 03/05/2020.     Current Diagnosis/Assessment:  Goals Addressed   None    Hyperlipidemia   LDL goal < ***  Lipid Panel     Component Value Date/Time   CHOL 229 (H) 11/19/2019 0958   CHOL 244 (H) 03/28/2013 0419   TRIG 178 (H) 11/19/2019 0958   TRIG 200 03/28/2013 0419   HDL 95 11/19/2019 0958   HDL 79 (H) 03/28/2013 0419   LDLCALC 104 (H) 11/19/2019 0958   LDLCALC 104 (H) 05/01/2017 0842   LDLCALC 125 (H) 03/28/2013 0419    Hepatic Function Latest Ref Rng & Units 11/19/2019 06/17/2019 12/26/2017  Total Protein 6.0 - 8.5 g/dL 6.6 5.9(L) -  Albumin 3.8 - 4.8 g/dL 4.4 4.0 4.4  AST 0 - 40 IU/L 25 16 -  ALT 0 - 32 IU/L 21 10 -  Alk  Phosphatase 48 - 121 IU/L 102 131(H) -  Total Bilirubin 0.0 - 1.2 mg/dL 0.3 <0.2 -     The 10-year ASCVD risk score Mikey Bussing DC Jr., et al., 2013) is: 14.7%   Values used to calculate the score:     Age: 33 years     Sex: Female     Is Non-Hispanic African American: No     Diabetic: Yes     Tobacco smoker: No     Systolic Blood Pressure: 159 mmHg     Is BP treated: Yes     HDL Cholesterol: 95 mg/dL     Total Cholesterol: 229 mg/dL   Patient has failed these meds in past: *** Patient is currently {CHL Controlled/[phone]} on the following medications:  . ***  We discussed:  Crestor increase?  Plan  Continue {CHL HP Upstream Pharmacy Plans:[phone]}  Diabetes   Recent Relevant Labs: Lab Results  Component Value Date/Time   HGBA1C 9.6 (A) 11/19/2019 10:15 AM   HGBA1C 11.8 (H) 06/17/2019 03:44 PM   HGBA1C 11.2 (A) 11/13/2017 04:45 PM   HGBA1C 8.5 07/13/2016 12:00 AM   HGBA1C 8.1 (A) 07/22/2014 12:00 AM   HGBA1C 9.2 (H) 03/27/2013 06:21 PM     Checking BG: {CHL HP Blood Glucose Monitoring [phone]}  Recent FBG Readings: Recent pre-meal BG readings: *** Recent 2hr PP BG readings:  *** Recent HS BG readings: *** Patient has failed these meds in past: *** Patient is currently {CHL Controlled/[phone]} on the following medications: ***  Last diabetic Foot exam:  Lab Results  Component Value Date/Time   HMDIABEYEEXA No Retinopathy 06/19/2018 12:00 AM    Last diabetic Eye exam: No results found for: HMDIABFOOTEX   We discussed:  FSBG at different times of day? Metformin increase? Ozempic increase?  Plan  Continue {CHL HP Upstream Pharmacy ELMRA:[id number]}  Medication Management   Pt uses *** pharmacy for all medications Uses pill box? {Yes or If no, why not?:20788} Pt endorses ***% compliance  We discussed: {Pharmacy options:24294}  Plan  {US Pharmacy DIXB:84784}    Follow up: *** month phone visit  ***

## 2020-03-08 ENCOUNTER — Other Ambulatory Visit: Payer: Self-pay | Admitting: Neurology

## 2020-03-08 ENCOUNTER — Telehealth: Payer: Self-pay | Admitting: *Deleted

## 2020-03-08 DIAGNOSIS — G20 Parkinson's disease: Secondary | ICD-10-CM | POA: Diagnosis not present

## 2020-03-08 DIAGNOSIS — R2689 Other abnormalities of gait and mobility: Secondary | ICD-10-CM

## 2020-03-08 DIAGNOSIS — H903 Sensorineural hearing loss, bilateral: Secondary | ICD-10-CM | POA: Diagnosis not present

## 2020-03-08 DIAGNOSIS — R200 Anesthesia of skin: Secondary | ICD-10-CM

## 2020-03-08 DIAGNOSIS — E531 Pyridoxine deficiency: Secondary | ICD-10-CM | POA: Diagnosis not present

## 2020-03-08 DIAGNOSIS — E538 Deficiency of other specified B group vitamins: Secondary | ICD-10-CM | POA: Diagnosis not present

## 2020-03-08 DIAGNOSIS — R202 Paresthesia of skin: Secondary | ICD-10-CM | POA: Diagnosis not present

## 2020-03-08 DIAGNOSIS — M5481 Occipital neuralgia: Secondary | ICD-10-CM | POA: Diagnosis not present

## 2020-03-08 DIAGNOSIS — E519 Thiamine deficiency, unspecified: Secondary | ICD-10-CM | POA: Diagnosis not present

## 2020-03-08 DIAGNOSIS — E1142 Type 2 diabetes mellitus with diabetic polyneuropathy: Secondary | ICD-10-CM | POA: Diagnosis not present

## 2020-03-08 DIAGNOSIS — E559 Vitamin D deficiency, unspecified: Secondary | ICD-10-CM | POA: Diagnosis not present

## 2020-03-08 NOTE — Chronic Care Management (AMB) (Signed)
  Care Management   Note  03/08/2020 Name: DEMONICA FARREY MRN: [id number] DOB: 1950/11/10  Katie Buckley is a 69 y.o. year old female who is a primary care patient of Caryn Section, Kirstie Peri, MD and is actively engaged with the care management team. I reached out to [place] by phone today to assist with re-scheduling a follow up visit with the Pharmacist  Follow up plan: Unsuccessful telephone outreach attempt made. A HIPAA compliant phone message was left for the patient providing contact information and requesting a return call.  The care management team will reach out to the patient again over the next 7 days.  If patient returns call to provider office, please advise to call [place] at [phone].  [place] Management

## 2020-03-11 ENCOUNTER — Telehealth: Payer: Self-pay

## 2020-03-11 NOTE — Telephone Encounter (Signed)
Copied from [place] [phone]. Topic: General - Other >> Mar 11, 2020  1:48 PM Keene Breath wrote: Reason for CRM: Patient stated she is returning a call to [facility] which she thinks is about her medication review.  Did not see any notes and patient would like a call back at [phone], ext. [phone]

## 2020-03-12 NOTE — Chronic Care Management (AMB) (Signed)
  Care Management   Note  03/12/2020 Name: JILLIEN YAKEL MRN: [id number] DOB: 23-Mar-1951  LARAINE SAMET is a 69 y.o. year old female who is a primary care patient of Caryn Section, Kirstie Peri, MD and is actively engaged with the care management team. I reached out to [place] by phone today to assist with re-scheduling a follow up visit with the Pharmacist  Follow up plan: Unsuccessful telephone outreach attempt made. A HIPAA compliant phone message was left for the patient providing contact information and requesting a return call.  The care management team will reach out to the patient again over the next 7 days.  If patient returns call to provider office, please advise to call [place] at [phone].  [place] Management

## 2020-03-12 NOTE — Chronic Care Management (AMB) (Signed)
  Care Management   Note  03/12/2020 Name: Katie Buckley MRN: [id number] DOB: Jul 03, 1950  Katie Buckley is a 70 y.o. year old female who is a primary care patient of Caryn Section, Kirstie Peri, MD and is actively engaged with the care management team. I reached out to [place] by phone today to assist with re-scheduling a follow up visit with the Pharmacist.  Follow up plan: Telephone appointment with care management team member scheduled for:03/22/2020  [place] Management

## 2020-03-15 ENCOUNTER — Other Ambulatory Visit: Payer: Self-pay | Admitting: Family Medicine

## 2020-03-15 DIAGNOSIS — R200 Anesthesia of skin: Secondary | ICD-10-CM | POA: Diagnosis not present

## 2020-03-15 DIAGNOSIS — R202 Paresthesia of skin: Secondary | ICD-10-CM | POA: Diagnosis not present

## 2020-03-16 ENCOUNTER — Other Ambulatory Visit: Payer: Self-pay

## 2020-03-16 ENCOUNTER — Ambulatory Visit
Admission: RE | Admit: 2020-03-16 | Discharge: 2020-03-16 | Disposition: A | Discharge status: Home / Self Care | Payer: PPO | Source: Ambulatory Visit | Attending: Neurology | Admitting: Neurology

## 2020-03-16 DIAGNOSIS — R202 Paresthesia of skin: Secondary | ICD-10-CM | POA: Diagnosis not present

## 2020-03-16 DIAGNOSIS — R2689 Other abnormalities of gait and mobility: Secondary | ICD-10-CM | POA: Diagnosis not present

## 2020-03-16 DIAGNOSIS — G20 Parkinson's disease: Secondary | ICD-10-CM | POA: Diagnosis not present

## 2020-03-16 DIAGNOSIS — J3489 Other specified disorders of nose and nasal sinuses: Secondary | ICD-10-CM | POA: Diagnosis not present

## 2020-03-16 DIAGNOSIS — R200 Anesthesia of skin: Secondary | ICD-10-CM | POA: Diagnosis not present

## 2020-03-16 DIAGNOSIS — H748X3 Other specified disorders of middle ear and mastoid, bilateral: Secondary | ICD-10-CM | POA: Diagnosis not present

## 2020-03-16 DIAGNOSIS — J320 Chronic maxillary sinusitis: Secondary | ICD-10-CM | POA: Diagnosis not present

## 2020-03-16 MED ORDER — GADOBUTROL 1 MMOL/ML IV SOLN
6.0000 mL | Freq: Once | INTRAVENOUS | Status: AC | PRN
  Administered 2020-03-16: 6 mL via INTRAVENOUS

## 2020-03-22 ENCOUNTER — Telehealth: Payer: PPO

## 2020-03-22 DIAGNOSIS — G20 Parkinson's disease: Secondary | ICD-10-CM | POA: Diagnosis not present

## 2020-03-22 NOTE — Chronic Care Management (AMB) (Deleted)
Chronic Care Management Pharmacy  Name: Katie Buckley  MRN: [id number] DOB: 12/06/1950  Chief Complaint/ HPI  Katie Buckley,  69 y.o. , female presents for their Follow-Up CCM visit with the clinical pharmacist via telephone.  PCP : [name], MD  Their chronic conditions include: ***  Office Visits:***  Consult Visit:***  Medications: Outpatient Encounter Medications as of 03/22/2020  Medication Sig Note  . amoxicillin (AMOXIL) 875 MG tablet Take 1 tablet (875 mg total) by mouth 2 (two) times daily. (Patient not taking: Reported on 12/19/2019)   . ARIPiprazole (ABILIFY) 5 MG tablet Take 5 mg by mouth daily. (Patient not taking: Reported on 11/28/2019)   . Biotin 10 MG CAPS Take 10 mg by mouth daily after lunch.   Marland Kitchen buPROPion (WELLBUTRIN SR) 150 MG 12 hr tablet Take 150 mg by mouth 2 (two) times daily. Morning & 3pm 11/19/2019: Patient thinks this was changed to Cymbalta  . butalbital-acetaminophen-caffeine (FIORICET) 50-325-40 MG tablet TAKE 1 TABLET BY MOUTH EVERY 6 (SIX) HOURS AS NEEDED FOR HEADACHE.   Marland Kitchen Cholecalciferol (VITAMIN D3 PO) Take 1,000 Units by mouth daily with lunch.    . esomeprazole (NEXIUM) 20 MG capsule Take 40 mg by mouth daily before breakfast.    . ezetimibe (ZETIA) 10 MG tablet TAKE 1 TABLET BY MOUTH EVERYDAY AT BEDTIME   . fexofenadine (ALLEGRA) 180 MG tablet Take 180 mg by mouth daily as needed for allergies or rhinitis.   . fluticasone (FLONASE) 50 MCG/ACT nasal spray PLACE 2 SPRAYS INTO BOTH NOSTRILS DAILY AS NEEDED FOR ALLERGIES.   Marland Kitchen gabapentin (NEURONTIN) 300 MG capsule Take 2 capsules (600 mg total) by mouth 2 (two) times daily. Morning & supper   . glipiZIDE (GLUCOTROL) 10 MG tablet TAKE 1 TABLET (10 MG TOTAL) BY MOUTH DAILY BEFORE BREAKFAST.   . hydrOXYzine (ATARAX/VISTARIL) 50 MG tablet Take 50 mg by mouth 2 (two) times daily.    Marland Kitchen JARDIANCE 10 MG TABS tablet TAKE 10 MG BY MOUTH DAILY.   Marland Kitchen ketotifen (ZADITOR) 0.025 % ophthalmic solution  Place 1 drop into both eyes 2 (two) times daily as needed (dry/irritated eyes.).   Marland Kitchen Lancet Devices MISC    . meloxicam (MOBIC) 15 MG tablet Take 1 tablet (15 mg total) by mouth daily. (Patient taking differently: Take 15 mg by mouth daily as needed (pain.). )   . metFORMIN (GLUCOPHAGE-XR) 500 MG 24 hr tablet TAKE 1 TABLET BY MOUTH EVERY DAY WITH BREAKFAST   . metoprolol-hydrochlorothiazide (LOPRESSOR HCT) 100-25 MG tablet TAKE 1/2 TABLET BY MOUTH EVERY DAY   . montelukast (SINGULAIR) 10 MG tablet TAKE 1 TABLET BY MOUTH EVERY DAY   . ondansetron (ZOFRAN) 4 MG tablet TAKE 1 TABLET BY MOUTH EVERY 8 HOURS AS NEEDED   . rosuvastatin (CRESTOR) 20 MG tablet Take 1 tablet (20 mg total) by mouth daily.   . Semaglutide, 1 MG/DOSE, (OZEMPIC, 1 MG/DOSE,) 2 MG/1.5ML SOPN Inject 0.75 mLs (1 mg total) into the skin once a week.   . sertraline (ZOLOFT) 100 MG tablet Take 200 mg by mouth daily.    . traZODone (DESYREL) 50 MG tablet Take 50-100 mg by mouth at bedtime as needed for sleep.    . valACYclovir (VALTREX) 1000 MG tablet Take 1 tablet daily for 5 days as needed (Patient not taking: Reported on 11/19/2019)    No facility-administered encounter medications on file as of 03/22/2020.     Current Diagnosis/Assessment:  Goals Addressed   None  Hypertension   BP goal is:  {CHL HP UPSTREAM Pharmacist BP ranges:[phone]}  Office blood pressures are  BP Readings from Last 3 Encounters:  11/19/19 118/62  10/10/19 (!) 157/77  06/17/19 (!) 154/62   Patient checks BP at home {CHL HP BP Monitoring Frequency:[id number]} Patient home BP readings are ranging: ***  Patient has failed these meds in the past: *** Patient is currently {CHL Controlled/[phone]} on the following medications:  . ***  We discussed {CHL HP Upstream Pharmacy [phone]}  Plan  Continue {CHL HP Upstream Pharmacy Plans:4581669446}     Hypertension   BP goal is:  {CHL HP UPSTREAM Pharmacist BP  ranges:[phone]}  Office blood pressures are  BP Readings from Last 3 Encounters:  11/19/19 118/62  10/10/19 (!) 157/77  06/17/19 (!) 154/62   Patient checks BP at home {CHL HP BP Monitoring Frequency:5676537074} Patient home BP readings are ranging: ***  Patient has failed these meds in the past: *** Patient is currently {CHL Controlled/[phone]} on the following medications:  . ***  We discussed {CHL HP Upstream Pharmacy [phone]}  Plan  Continue {CHL HP Upstream Pharmacy Plans:4581669446}       Medication Management   Pt uses *** pharmacy for all medications Uses pill box? {Yes or If no, why not?:20788} Pt endorses ***% compliance  We discussed:  Changed to Ozempic Recent falls?  Plan  {US Pharmacy NOBS:96283}    Follow up: *** month phone visit  ***

## 2020-03-23 ENCOUNTER — Other Ambulatory Visit: Payer: Self-pay | Admitting: Neurology

## 2020-03-23 DIAGNOSIS — M779 Enthesopathy, unspecified: Secondary | ICD-10-CM

## 2020-04-05 ENCOUNTER — Other Ambulatory Visit: Payer: Self-pay | Admitting: Family Medicine

## 2020-04-05 NOTE — Telephone Encounter (Signed)
I received a prior authorization request for Trulicity, but we changed her to Ozempic at her last visit in June. Please check with patient to see which medication she is taking Also due for follow up office visit.

## 2020-04-06 NOTE — Telephone Encounter (Signed)
Requested medication (s) are due for refill today:yes  Requested medication (s) are on the active medication list: yes  Last refill: 02/29/20  Future visit scheduled: yes  Notes to clinic:  not delegated    Requested Prescriptions  Pending Prescriptions Disp Refills   butalbital-acetaminophen-caffeine (FIORICET) 50-325-40 MG tablet 30 tablet 0    Sig: Take 1 tablet by mouth every 6 (six) hours as needed for headache.      Not Delegated - Analgesics:  Non-Opioid Analgesic Combinations Failed - 04/06/2020  2:28 PM      Failed - This refill cannot be delegated      Passed - Valid encounter within last 12 months    Recent Outpatient Visits           4 months ago Acute non-recurrent pansinusitis   [facility] [place], [place], [name]   4 months ago Uncontrolled type 2 diabetes mellitus with diabetic neuropathy, without long-term current use of insulin [facility])   [facility] [name], MD   5 months ago Wound cellulitis   [place], [name]   6 months ago Diarrhea of presumed infectious origin   [facility] [place], Clearnce Sorrel, PA-C   9 months ago Medicare annual wellness visit, subsequent   [facility] [place] [name], MD       Future Appointments             In 2 weeks Caryn Section, Kirstie Peri, MD [facility], [place]

## 2020-04-06 NOTE — Telephone Encounter (Signed)
LMTCB-if patient calls ok for [facility] nurse to relay message and schedule patient for follow up appt.

## 2020-04-06 NOTE — Telephone Encounter (Signed)
Spoke to patient- she is not sure why the office got PA form- she is taking the ozempic as prescribed. Patient has scheduled a follow up appointment. Patient asked if she could get RF of her headache medication- I told her I would send request.

## 2020-04-07 MED ORDER — BUTALBITAL-APAP-CAFFEINE 50-325-40 MG PO TABS: 1.0000 | ORAL_TABLET | Freq: Four times a day (QID) | ORAL | 0 refills | Status: DC | PRN

## 2020-04-08 ENCOUNTER — Other Ambulatory Visit: Payer: Self-pay

## 2020-04-08 ENCOUNTER — Ambulatory Visit
Admission: RE | Admit: 2020-04-08 | Discharge: 2020-04-08 | Disposition: A | Discharge status: Home / Self Care | Payer: PPO | Source: Ambulatory Visit | Attending: Neurology | Admitting: Neurology

## 2020-04-08 DIAGNOSIS — M4319 Spondylolisthesis, multiple sites in spine: Secondary | ICD-10-CM | POA: Diagnosis not present

## 2020-04-08 DIAGNOSIS — M779 Enthesopathy, unspecified: Secondary | ICD-10-CM | POA: Diagnosis not present

## 2020-04-08 DIAGNOSIS — M4802 Spinal stenosis, cervical region: Secondary | ICD-10-CM | POA: Diagnosis not present

## 2020-04-08 DIAGNOSIS — M5021 Other cervical disc displacement,  high cervical region: Secondary | ICD-10-CM | POA: Diagnosis not present

## 2020-04-08 DIAGNOSIS — R42 Dizziness and giddiness: Secondary | ICD-10-CM | POA: Diagnosis not present

## 2020-04-22 ENCOUNTER — Other Ambulatory Visit: Payer: Self-pay | Admitting: Family Medicine

## 2020-04-22 DIAGNOSIS — E1140 Type 2 diabetes mellitus with diabetic neuropathy, unspecified: Secondary | ICD-10-CM

## 2020-04-22 DIAGNOSIS — IMO0002 Reserved for concepts with insufficient information to code with codable children: Secondary | ICD-10-CM

## 2020-04-23 ENCOUNTER — Ambulatory Visit: Payer: Self-pay | Admitting: Family Medicine

## 2020-04-27 ENCOUNTER — Ambulatory Visit: Payer: Self-pay | Admitting: Family Medicine

## 2020-04-27 ENCOUNTER — Telehealth: Payer: Self-pay

## 2020-04-27 NOTE — Telephone Encounter (Signed)
Copied from [place] [id number]. Topic: General - Other >> Apr 27, 2020  1:51 PM Keene Breath wrote: Reason for CRM: Patient called to cancel appt. For today.  Stated she was not feeling well  Will call to reschedule at a later time.

## 2020-05-10 ENCOUNTER — Telehealth: Payer: Self-pay

## 2020-05-10 ENCOUNTER — Other Ambulatory Visit: Payer: Self-pay | Admitting: Family Medicine

## 2020-05-10 DIAGNOSIS — R110 Nausea: Secondary | ICD-10-CM

## 2020-05-10 NOTE — Telephone Encounter (Signed)
fisCopied from [place] [phone]. Topic: General - Other >> May 05, 2020  5:03 PM Katie Buckley wrote: Reason for CRM: pt's spouse called in for assistance with scheduling a booster shot    Please assist [phone]

## 2020-05-10 NOTE — Telephone Encounter (Signed)
Requested medication (s) are due for refill today: yes  Requested medication (s) are on the active medication list: yes  Last refill:   Fioricet  04/07/20  #30  0 refills         Zofran   01/05/20  #  20  1 refill  Future visit scheduled    no  Notes to clinic:  Not delegated  Requested Prescriptions  Pending Prescriptions Disp Refills   butalbital-acetaminophen-caffeine (FIORICET) 50-325-40 MG tablet [Pharmacy Med Name: BUTALB-ACETAMIN-CAFF 50-325-40] 30 tablet     Sig: TAKE 1 TABLET BY MOUTH EVERY 6 HOURS AS NEEDED FOR HEADACHE.      Not Delegated - Analgesics:  Non-Opioid Analgesic Combinations Failed - 05/10/2020  8:32 PM      Failed - This refill cannot be delegated      Passed - Valid encounter within last 12 months    Recent Outpatient Visits           5 months ago Acute non-recurrent pansinusitis   [facility] [place], Anderson Malta M, [name]   5 months ago Uncontrolled type 2 diabetes mellitus with diabetic neuropathy, without long-term current use of insulin [facility])   [facility] [name], MD   7 months ago Wound cellulitis   [facility] [place] Malling M, [name]   7 months ago Diarrhea of presumed infectious origin   [facility] [place], Clearnce Sorrel, PA-C   10 months ago Medicare annual wellness visit, subsequent   [facility] [name], MD                ondansetron (ZOFRAN) 4 MG tablet [Pharmacy Med Name: ONDANSETRON HCL 4 MG TABLET] 20 tablet 1    Sig: TAKE 1 TABLET BY MOUTH EVERY 8 HOURS AS NEEDED      Not Delegated - Gastroenterology: Antiemetics Failed - 05/10/2020  8:32 PM      Failed - This refill cannot be delegated      Passed - Valid encounter within last 6 months    Recent Outpatient Visits           5 months ago Acute non-recurrent pansinusitis   [facility] [place], Anderson Malta M, PA-C   5 months ago Uncontrolled type 2 diabetes mellitus with  diabetic neuropathy, without long-term current use of insulin [facility])   [facility] [name], MD   7 months ago Wound cellulitis   [place], [name]   7 months ago Diarrhea of presumed infectious origin   [place], Clearnce Sorrel, PA-C   10 months ago Medicare annual wellness visit, subsequent   [facility] [name], MD

## 2020-05-12 DIAGNOSIS — G629 Polyneuropathy, unspecified: Secondary | ICD-10-CM | POA: Diagnosis not present

## 2020-05-12 DIAGNOSIS — F419 Anxiety disorder, unspecified: Secondary | ICD-10-CM | POA: Diagnosis not present

## 2020-05-12 DIAGNOSIS — E1142 Type 2 diabetes mellitus with diabetic polyneuropathy: Secondary | ICD-10-CM | POA: Diagnosis not present

## 2020-05-12 DIAGNOSIS — R202 Paresthesia of skin: Secondary | ICD-10-CM | POA: Diagnosis not present

## 2020-05-12 DIAGNOSIS — R2689 Other abnormalities of gait and mobility: Secondary | ICD-10-CM | POA: Diagnosis not present

## 2020-05-12 DIAGNOSIS — R200 Anesthesia of skin: Secondary | ICD-10-CM | POA: Diagnosis not present

## 2020-05-12 DIAGNOSIS — M4802 Spinal stenosis, cervical region: Secondary | ICD-10-CM | POA: Diagnosis not present

## 2020-05-17 ENCOUNTER — Other Ambulatory Visit: Payer: PPO

## 2020-05-17 DIAGNOSIS — Z20822 Contact with and (suspected) exposure to covid-19: Secondary | ICD-10-CM | POA: Diagnosis not present

## 2020-05-18 ENCOUNTER — Other Ambulatory Visit: Payer: Self-pay | Admitting: Family Medicine

## 2020-05-18 ENCOUNTER — Telehealth: Payer: Self-pay

## 2020-05-18 ENCOUNTER — Ambulatory Visit: Payer: Self-pay

## 2020-05-18 DIAGNOSIS — J301 Allergic rhinitis due to pollen: Secondary | ICD-10-CM

## 2020-05-18 NOTE — Telephone Encounter (Signed)
Patient called and says she was [facility] and was notified it is positive. She says she believes she was exposed to a co-worker who has been out of work sick. She says her symptoms started on Friday-diarrhea, cough and chest congestion the worst; headache, fatigue, muscle aches, chills, taste is off (only taste salt, but this has been ongoing). She says she has mild SOB-breathing a little labored at rest and when up moving around. She says she feels a little better than yesterday and would like to know what she can do. I advised a virtual visit, appointment scheduled for tomorrow at 1120 with Dr. Caryn Section, care advice given, patient verbalized understanding. Advised to quarantine x 14 days of onset of symptoms.  Reason for Disposition . HIGH RISK for severe COVID complications (e.g., age > 39 years, obesity with BMI > 25, pregnant, chronic lung disease or other chronic medical condition)  (Exception: Already seen by PCP and no new or worsening symptoms.)  Answer Assessment - Initial Assessment Questions 1. COVID-19 DIAGNOSIS: "Who made your Coronavirus (COVID-19) diagnosis?" "Was it confirmed by a positive lab test?" If not diagnosed by a HCP, ask "Are there lots of cases (community spread) where you live?" (See public health department website, if unsure)     [facility] 2. COVID-19 EXPOSURE: "Was there any known exposure to COVID before the symptoms began?" CDC Definition of close contact: within 6 feet (2 meters) for a total of 15 minutes or more over a 24-hour period.      No 3. ONSET: "When did the COVID-19 symptoms start?"      Friday 4. WORST SYMPTOM: "What is your worst symptom?" (e.g., cough, fever, shortness of breath, muscle aches)     Cough, chest congestion, diarrhea 5. COUGH: "Do you have a cough?" If Yes, ask: "How bad is the cough?"       Yes, pretty bad, hurts chest with coughing 6. FEVER: "Do you have a fever?" If Yes, ask: "What is your  temperature, how was it measured, and when did it start?"     99 last night 7. RESPIRATORY STATUS: "Describe your breathing?" (e.g., shortness of breath, wheezing, unable to speak)       Some wheezing, SOB (mild-at rest and moving around) 8. BETTER-SAME-WORSE: "Are you getting better, staying the same or getting worse compared to yesterday?"  If getting worse, ask, "In what way?"     A little better 9. HIGH RISK DISEASE: "Do you have any chronic medical problems?" (e.g., asthma, heart or lung disease, weak immune system, obesity, etc.)     HTN, diabetes 10. PREGNANCY: "Is there any chance you are pregnant?" "When was your last menstrual period?"       No 11. OTHER SYMPTOMS: "Do you have any other symptoms?"  (e.g., chills, fatigue, headache, loss of smell or taste, muscle pain, sore throat; new loss of smell or taste especially support the diagnosis of COVID-19)       Diarrhea, fatigue, headache, muscle aches, chills, taste is off  Protocols used: CORONAVIRUS (COVID-19) DIAGNOSED OR SUSPECTED-A-AH

## 2020-05-18 NOTE — Telephone Encounter (Signed)
Need more info, I don't know anything about what's going on with this patient. If covid+ with symptoms, and  symptoms have been going on for 7 days or less then recommend referral to monoclonal antibody clinic.

## 2020-05-18 NOTE — Telephone Encounter (Signed)
[facility] triage scheduled virtual appointment for tomorrow.

## 2020-05-18 NOTE — Telephone Encounter (Signed)
Copied from [place] [id number]. Topic: General - Other >> May 18, 2020 10:29 AM Leward Quan A wrote: Reason for CRM: Patient husband called in to inquire of Dr Caryn Section or his nurse on what is his next step since they were just diagnosed with a positive Covid test result. Please call Ph# [phone]

## 2020-05-18 NOTE — Telephone Encounter (Signed)
Patient has symptoms, called back and triaged by [facility] NT, see triage encounter.

## 2020-05-19 ENCOUNTER — Ambulatory Visit: Payer: PPO

## 2020-05-19 ENCOUNTER — Other Ambulatory Visit: Payer: Self-pay

## 2020-05-19 ENCOUNTER — Encounter: Payer: Self-pay | Admitting: Family Medicine

## 2020-05-19 ENCOUNTER — Ambulatory Visit (INDEPENDENT_AMBULATORY_CARE_PROVIDER_SITE_OTHER): Payer: PPO | Admitting: Family Medicine

## 2020-05-19 DIAGNOSIS — U071 COVID-19: Secondary | ICD-10-CM | POA: Diagnosis not present

## 2020-05-19 DIAGNOSIS — R059 Cough, unspecified: Secondary | ICD-10-CM | POA: Diagnosis not present

## 2020-05-19 MED ORDER — HYDROCODONE-HOMATROPINE 5-1.5 MG/5ML PO SYRP: 5.0000 mL | ORAL_SOLUTION | Freq: Three times a day (TID) | ORAL | 0 refills | Status: DC | PRN

## 2020-05-19 NOTE — Progress Notes (Signed)
MyChart Video Visit    Virtual Visit via Video Note   This visit type was conducted due to national recommendations for restrictions regarding the COVID-19 Pandemic (e.g. social distancing) in an effort to limit this patient's exposure and mitigate transmission in our community. This patient is at least at moderate risk for complications without adequate follow up. This format is felt to be most appropriate for this patient at this time. Physical exam was limited by quality of the video and audio technology used for the visit.   Patient location: home Provider location: [facility]  I discussed the limitations of evaluation and management by telemedicine and the availability of in person appointments. The patient expressed understanding and agreed to proceed.  Patient: Katie Buckley   DOB: 12-26-1950   69 y.o. Female  MRN: [id number] Visit Date: 05/19/2020  Today's healthcare provider: Lelon Huh, MD   Chief Complaint  Patient presents with  . Cough   Subjective    Cough This is a new problem. Episode onset: 5 days ago. The problem has been gradually improving. The cough is productive of sputum (white-yellow colored sputum). Associated symptoms include chills, ear pain, a fever (low grade fever), headaches, myalgias, shortness of breath and wheezing. Pertinent negatives include no chest pain. Treatments tried: Tylenol, Allegra and Singulair. The treatment provided mild relief.    Patient reports [facility], her husband developed same same symptoms about two days later and was also positive for Covid.   She had two doses of Pfizer Covid vaccine in February and March. Her husband had booster about on 05-12-2020. .     Medications: Outpatient Medications Prior to Visit  Medication Sig  . Biotin 10 MG CAPS Take 10 mg by mouth daily after lunch.  Marland Kitchen buPROPion (WELLBUTRIN SR) 150 MG 12 hr tablet Take 150 mg by mouth 2 (two) times daily.  Morning & 3pm  . butalbital-acetaminophen-caffeine (FIORICET) 50-325-40 MG tablet Take 1 tablet by mouth every 6 (six) hours as needed for headache.  . Cholecalciferol (VITAMIN D3 PO) Take 1,000 Units by mouth daily with lunch.   . esomeprazole (NEXIUM) 20 MG capsule Take 40 mg by mouth daily before breakfast.   . ezetimibe (ZETIA) 10 MG tablet TAKE 1 TABLET BY MOUTH EVERYDAY AT BEDTIME  . fexofenadine (ALLEGRA) 180 MG tablet Take 180 mg by mouth daily as needed for allergies or rhinitis.  Marland Kitchen gabapentin (NEURONTIN) 300 MG capsule TAKE 2 CAPSULES (600 MG TOTAL) BY MOUTH 2 (TWO) TIMES DAILY. MORNING & SUPPER  . glipiZIDE (GLUCOTROL) 10 MG tablet TAKE 1 TABLET (10 MG TOTAL) BY MOUTH DAILY BEFORE BREAKFAST.  . hydrOXYzine (ATARAX/VISTARIL) 50 MG tablet Take 50 mg by mouth 2 (two) times daily.   Marland Kitchen ketotifen (ZADITOR) 0.025 % ophthalmic solution Place 1 drop into both eyes 2 (two) times daily as needed (dry/irritated eyes.).  Marland Kitchen Lancet Devices MISC   . meloxicam (MOBIC) 15 MG tablet Take 1 tablet (15 mg total) by mouth daily. (Patient taking differently: Take 15 mg by mouth daily as needed (pain.). )  . metFORMIN (GLUCOPHAGE-XR) 500 MG 24 hr tablet TAKE 1 TABLET BY MOUTH EVERY DAY WITH BREAKFAST  . metoprolol-hydrochlorothiazide (LOPRESSOR HCT) 100-25 MG tablet TAKE 1/2 TABLET BY MOUTH EVERY DAY  . montelukast (SINGULAIR) 10 MG tablet TAKE 1 TABLET BY MOUTH EVERY DAY  . ondansetron (ZOFRAN) 4 MG tablet Take 1 tablet (4 mg total) by mouth every 8 (eight) hours as needed.  Marland Kitchen  rosuvastatin (CRESTOR) 20 MG tablet Take 1 tablet (20 mg total) by mouth daily.  . Semaglutide, 1 MG/DOSE, (OZEMPIC, 1 MG/DOSE,) 2 MG/1.5ML SOPN Inject 0.75 mLs (1 mg total) into the skin once a week.  . sertraline (ZOLOFT) 100 MG tablet Take 200 mg by mouth daily.   . traZODone (DESYREL) 50 MG tablet Take 50-100 mg by mouth at bedtime as needed for sleep.   . valACYclovir (VALTREX) 1000 MG tablet Take 1 tablet daily for 5 days as  needed  . ARIPiprazole (ABILIFY) 5 MG tablet Take 5 mg by mouth daily. (Patient not taking: Reported on 05/19/2020)  . fluticasone (FLONASE) 50 MCG/ACT nasal spray PLACE 2 SPRAYS INTO BOTH NOSTRILS DAILY AS NEEDED FOR ALLERGIES. (Patient not taking: Reported on 05/19/2020)  . JARDIANCE 10 MG TABS tablet TAKE 10 MG BY MOUTH DAILY. (Patient not taking: Reported on 05/19/2020)  . [DISCONTINUED] amoxicillin (AMOXIL) 875 MG tablet Take 1 tablet (875 mg total) by mouth 2 (two) times daily. (Patient not taking: Reported on 12/19/2019)   No facility-administered medications prior to visit.    Review of Systems  Constitutional: Positive for appetite change (loss if appetite), chills, fatigue and fever (low grade fever).       Loss of taste  HENT: Positive for congestion (chest congestion), ear pain, sinus pressure and sinus pain.        Ear fullness  Eyes: Positive for itching.  Respiratory: Positive for cough, shortness of breath and wheezing. Negative for chest tightness.   Cardiovascular: Negative for chest pain and palpitations.  Gastrointestinal: Positive for nausea. Negative for abdominal pain and vomiting.  Musculoskeletal: Positive for myalgias.  Neurological: Positive for headaches. Negative for dizziness and weakness.      Objective    There were no vitals taken for this visit.   Physical Exam   Awake, alert oriented x 3   Assessment & Plan     1. Cough  - HYDROcodone-homatropine (HYCODAN) 5-1.5 MG/5ML syrup; Take 5 mLs by mouth every 8 (eight) hours as needed for cough.  Dispense: 120 mL; Refill: 0  2. COVID-19 Moderate symptoms less than 7 days. Has been 10 months since initially vaccinated and has not had booster. Multiple comorbidities. Secure chat message sent to [facility]         I discussed the assessment and treatment plan with the patient. The patient was provided an opportunity to ask questions and all were answered. The patient agreed with the plan and  demonstrated an understanding of the instructions.   The patient was advised to call back or seek an in-person evaluation if the symptoms worsen or if the condition fails to improve as anticipated.  I provided 10 minutes of non-face-to-face time during this encounter.  Video connection was lost when less than 50% of the duration of the visit was complete, at which time the remainder of the visit was completed via audio only.  The entirety of the information documented in the History of Present Illness, Review of Systems and Physical Exam were personally obtained by me. Portions of this information were initially documented by the CMA and reviewed by me for thoroughness and accuracy.     Lelon Huh, MD [facility] [phone] (phone) [phone] (fax)  [place]

## 2020-05-20 ENCOUNTER — Ambulatory Visit: Payer: Self-pay | Admitting: *Deleted

## 2020-05-20 ENCOUNTER — Other Ambulatory Visit: Payer: Self-pay | Admitting: Nurse Practitioner

## 2020-05-20 ENCOUNTER — Encounter: Payer: Self-pay | Admitting: Nurse Practitioner

## 2020-05-20 DIAGNOSIS — U071 COVID-19: Secondary | ICD-10-CM

## 2020-05-20 NOTE — Progress Notes (Signed)
I connected by phone with Katie Buckley on 05/20/2020 at 4:57 PM to discuss the potential use of a treatment for mild to moderate COVID-19 viral infection in non-hospitalized patients.  This patient is a 69 y.o. female that meets the FDA criteria for Emergency Use Authorization of bamlanivimab/etesevimab, casirivimab\imdevimab, or sotrovimab  Has a (+) direct SARS-CoV-2 viral test result  Has mild or moderate COVID-19   Is ? 68 years of age and weighs ? 40 kg  Is NOT hospitalized due to COVID-19  Is NOT requiring oxygen therapy or requiring an increase in baseline oxygen flow rate due to COVID-19  Is within 10 days of symptom onset  Has at least one of the high risk factor(s) for progression to severe COVID-19 and/or hospitalization as defined in EUA.  Specific high risk criteria : Older age (>/= 68 yo), BMI > 25 and Cardiovascular disease or hypertension   I have spoken and communicated the following to the patient or parent/caregiver:  1. FDA has authorized the emergency use of bamlanivimab/etesevimab, casirivimab\imdevimab, or sotrovimab for the treatment of mild to moderate COVID-19 in adults and pediatric patients with positive results of direct SARS-CoV-2 viral testing who are 33 years of age and older weighing at least 40 kg, and who are at high risk for progressing to severe COVID-19 and/or hospitalization.  2. The significant known and potential risks and benefits of bamlanivimab/etesevimab, casirivimab\imdevimab, or sotrovimab, and the extent to which such potential risks and benefits are unknown.  3. Information on available alternative treatments and the risks and benefits of those alternatives, including clinical trials.  4. Patients treated with bamlanivimab/etesevimab, casirivimab\imdevimab, or sotrovimab should continue to self-isolate and use infection control measures (e.g., wear mask, isolate, social distance, avoid sharing personal items, clean and disinfect "high  touch" surfaces, and frequent handwashing) according to CDC guidelines.   5. The patient or parent/caregiver has the option to accept or refuse bamlanivimab/etesevimab, casirivimab\imdevimab, or sotrovimab.  After reviewing this information with the patient, the patient has agreed to receive one of the available covid 19 monoclonal antibodies and will be provided an appropriate fact sheet prior to infusion.Beckey Rutter, [place], AGNP-C [phone] ([place])

## 2020-05-20 NOTE — Telephone Encounter (Signed)
Patient calling to request information concerning MAB infusion clinic. Patient requesting to get a call back from clinic when she and her husband can get the MAB infusion. Reviewed process of infusion clinic assessing criteria standards and they will call patients to notify if they meet the criteria to receive infusion. Patient reports she continues to have diarrhea and poor po intake. Reviewed signs and symptoms of dehydration. Will send patient information to MAB infusion clinic. Care advise given. Patient verbalized understanding of care advise and to call back or go to [facility] or ED if symptoms worsen.   Reason for Disposition . HIGH RISK for severe COVID complications (e.g., age > 15 years, obesity with BMI > 25, pregnant, chronic lung disease or other chronic medical condition)  (Exception: Already seen by PCP and no new or worsening symptoms.)  Answer Assessment - Initial Assessment Questions 1. COVID-19 DIAGNOSIS: "Who made your Coronavirus (COVID-19) diagnosis?" "Was it confirmed by a positive lab test?" If not diagnosed by a HCP, ask "Are there lots of cases (community spread) where you live?" (See public health department website, if unsure)     na 2. COVID-19 EXPOSURE: "Was there any known exposure to COVID before the symptoms began?" CDC Definition of close contact: within 6 feet (2 meters) for a total of 15 minutes or more over a 24-hour period.      na 3. ONSET: "When did the COVID-19 symptoms start?"      05/14/20 4. WORST SYMPTOM: "What is your worst symptom?" (e.g., cough, fever, shortness of breath, muscle aches)     Diarrhea, cough  5. COUGH: "Do you have a cough?" If Yes, ask: "How bad is the cough?"       yes 6. FEVER: "Do you have a fever?" If Yes, ask: "What is your temperature, how was it measured, and when did it start?"     no 7. RESPIRATORY STATUS: "Describe your breathing?" (e.g., shortness of breath, wheezing, unable to speak)      No  8. BETTER-SAME-WORSE: "Are you  getting better, staying the same or getting worse compared to yesterday?"  If getting worse, ask, "In what way?"     na 9. HIGH RISK DISEASE: "Do you have any chronic medical problems?" (e.g., asthma, heart or lung disease, weak immune system, obesity, etc.)     Diabetes  10. PREGNANCY: "Is there any chance you are pregnant?" "When was your last menstrual period?"       na 11. OTHER SYMPTOMS: "Do you have any other symptoms?"  (e.g., chills, fatigue, headache, loss of smell or taste, muscle pain, sore throat; new loss of smell or taste especially support the diagnosis of COVID-19)       Diarrhea, cough  Protocols used: CORONAVIRUS (COVID-19) DIAGNOSED OR SUSPECTED-A-AH

## 2020-05-21 ENCOUNTER — Other Ambulatory Visit: Payer: Self-pay

## 2020-05-21 ENCOUNTER — Ambulatory Visit (HOSPITAL_COMMUNITY)
Admission: RE | Admit: 2020-05-21 | Discharge: 2020-05-21 | Disposition: A | Discharge status: Home / Self Care | Payer: Medicare Other | Source: Ambulatory Visit | Attending: Pulmonary Disease | Admitting: Pulmonary Disease

## 2020-05-21 DIAGNOSIS — U071 COVID-19: Secondary | ICD-10-CM | POA: Insufficient documentation

## 2020-05-21 DIAGNOSIS — Z23 Encounter for immunization: Secondary | ICD-10-CM | POA: Insufficient documentation

## 2020-05-21 MED ORDER — SODIUM CHLORIDE 0.9 % IV SOLN
1200.0000 mg | Freq: Once | INTRAVENOUS | Status: AC
  Administered 2020-05-21: 1200 mg via INTRAVENOUS

## 2020-05-21 MED ORDER — SODIUM CHLORIDE 0.9 % IV SOLN: INTRAVENOUS | Status: DC | PRN

## 2020-05-21 MED ORDER — EPINEPHRINE 0.3 MG/0.3ML IJ SOAJ: 0.3000 mg | Freq: Once | INTRAMUSCULAR | Status: DC | PRN

## 2020-05-21 MED ORDER — FAMOTIDINE IN NACL 20-0.9 MG/50ML-% IV SOLN: 20.0000 mg | Freq: Once | INTRAVENOUS | Status: DC | PRN

## 2020-05-21 MED ORDER — METHYLPREDNISOLONE SODIUM SUCC 125 MG IJ SOLR: 125.0000 mg | Freq: Once | INTRAMUSCULAR | Status: DC | PRN

## 2020-05-21 MED ORDER — ALBUTEROL SULFATE HFA 108 (90 BASE) MCG/ACT IN AERS: 2.0000 | INHALATION_SPRAY | Freq: Once | RESPIRATORY_TRACT | Status: DC | PRN

## 2020-05-21 MED ORDER — DIPHENHYDRAMINE HCL 50 MG/ML IJ SOLN: 50.0000 mg | Freq: Once | INTRAMUSCULAR | Status: DC | PRN

## 2020-05-21 NOTE — Progress Notes (Signed)
Patient reviewed Fact Sheet for Patients, Parents, and Caregivers for Emergency Use Authorization (EUA) of Casi/Regen for the Treatment of Coronavirus. Patient also reviewed and is agreeable to the estimated cost of treatment. Patient is agreeable to proceed.    

## 2020-05-21 NOTE — Progress Notes (Signed)
  Diagnosis: COVID-19  Physician: Patrick Wright, MD  Procedure: Covid Infusion Clinic Med: casirivimab\imdevimab infusion - Provided patient with casirivimab\imdevimab fact sheet for patients, parents and caregivers prior to infusion.  Complications: No immediate complications noted.  Discharge: Discharged home   Charnita Trudel N Terressa Evola 05/21/2020  

## 2020-05-21 NOTE — Discharge Instructions (Signed)
10 Things You Can Do to Manage Your COVID-19 Symptoms at Home If you have possible or confirmed COVID-19: 1. Stay home from work and school. And stay away from other public places. If you must go out, avoid using any kind of public transportation, ridesharing, or taxis. 2. Monitor your symptoms carefully. If your symptoms get worse, call your healthcare provider immediately. 3. Get rest and stay hydrated. 4. If you have a medical appointment, call the healthcare provider ahead of time and tell them that you have or may have COVID-19. 5. For medical emergencies, call 911 and notify the dispatch personnel that you have or may have COVID-19. 6. Cover your cough and sneezes with a tissue or use the inside of your elbow. 7. Wash your hands often with soap and water for at least 20 seconds or clean your hands with an alcohol-based hand sanitizer that contains at least 60% alcohol. 8. As much as possible, stay in a specific room and away from other people in your home. Also, you should use a separate bathroom, if available. If you need to be around other people in or outside of the home, wear a mask. 9. Avoid sharing personal items with other people in your household, like dishes, towels, and bedding. 10. Clean all surfaces that are touched often, like counters, tabletops, and doorknobs. Use household cleaning sprays or wipes according to the label instructions. cdc.gov/coronavirus 12/11/2018 This information is not intended to replace advice given to you by your health care provider. Make sure you discuss any questions you have with your health care provider. Document Revised: 05/15/2019 Document Reviewed: 05/15/2019 Elsevier Patient Education  2020 Elsevier Inc. What types of side effects do monoclonal antibody drugs cause?  Common side effects  In general, the more common side effects caused by monoclonal antibody drugs include: . Allergic reactions, such as hives or itching . Flu-like signs and  symptoms, including chills, fatigue, fever, and muscle aches and pains . Nausea, vomiting . Diarrhea . Skin rashes . Low blood pressure   The CDC is recommending patients who receive monoclonal antibody treatments wait at least 90 days before being vaccinated.  Currently, there are no data on the safety and efficacy of mRNA COVID-19 vaccines in persons who received monoclonal antibodies or convalescent plasma as part of COVID-19 treatment. Based on the estimated half-life of such therapies as well as evidence suggesting that reinfection is uncommon in the 90 days after initial infection, vaccination should be deferred for at least 90 days, as a precautionary measure until additional information becomes available, to avoid interference of the antibody treatment with vaccine-induced immune responses. If you have any questions or concerns after the infusion please call the Advanced Practice Provider on call at [phone]. This number is ONLY intended for your use regarding questions or concerns about the infusion post-treatment side-effects.  Please do not provide this number to others for use. For return to work notes please contact your primary care provider.   If someone you know is interested in receiving treatment please have them call the COVID hotline at [phone].   

## 2020-05-25 ENCOUNTER — Other Ambulatory Visit: Payer: Self-pay | Admitting: Family Medicine

## 2020-05-25 DIAGNOSIS — F411 Generalized anxiety disorder: Secondary | ICD-10-CM | POA: Diagnosis not present

## 2020-05-25 DIAGNOSIS — G4700 Insomnia, unspecified: Secondary | ICD-10-CM | POA: Diagnosis not present

## 2020-05-25 DIAGNOSIS — F331 Major depressive disorder, recurrent, moderate: Secondary | ICD-10-CM | POA: Diagnosis not present

## 2020-05-25 DIAGNOSIS — R110 Nausea: Secondary | ICD-10-CM

## 2020-05-25 DIAGNOSIS — R41840 Attention and concentration deficit: Secondary | ICD-10-CM | POA: Diagnosis not present

## 2020-05-26 NOTE — Telephone Encounter (Signed)
Requested medication (s) are due for refill today:yes   Requested medication (s) are on the active medication list: yes  Last refill: 05/11/20  #10  0 refillls, both meds  Future visit scheduled  no  Notes to clinic: not delegated  Requested Prescriptions  Pending Prescriptions Disp Refills   butalbital-acetaminophen-caffeine (FIORICET) 50-325-40 MG tablet [Pharmacy Med Name: BUTALB-ACETAMIN-CAFF 50-325-40] 10 tablet     Sig: TAKE 1 TABLET BY MOUTH EVERY 6 HOURS AS NEEDED FOR HEADACHE.      Not Delegated - Analgesics:  Non-Opioid Analgesic Combinations Failed - 05/25/2020  7:27 PM      Failed - This refill cannot be delegated      Passed - Valid encounter within last 12 months    Recent Outpatient Visits           1 week ago Cough   [facility] [name], MD   6 months ago Acute non-recurrent pansinusitis   [facility] Fenton Malling M, [name]   6 months ago Uncontrolled type 2 diabetes mellitus with diabetic neuropathy, without long-term current use of insulin [facility])   [facility] [name], MD   7 months ago Wound cellulitis   [facility] Fenton Malling M, [name]   8 months ago Diarrhea of presumed infectious origin   [facility] [place], Anderson Malta M, PA-C                  ondansetron (ZOFRAN) 4 MG tablet [Pharmacy Med Name: ONDANSETRON HCL 4 MG TABLET] 10 tablet 0    Sig: TAKE 1 TABLET BY MOUTH EVERY 8 HOURS AS NEEDED      Not Delegated - Gastroenterology: Antiemetics Failed - 05/25/2020  7:27 PM      Failed - This refill cannot be delegated      Passed - Valid encounter within last 6 months    Recent Outpatient Visits           1 week ago Cough   [facility] [name], MD   6 months ago Acute non-recurrent pansinusitis   [place], Anderson Malta M, [name]   6 months ago Uncontrolled type 2 diabetes mellitus with diabetic  neuropathy, without long-term current use of insulin [facility])   [facility] [name], MD   7 months ago Wound cellulitis   [place], [name]   8 months ago Diarrhea of presumed infectious origin   [facility] [place], [name]

## 2020-05-31 ENCOUNTER — Ambulatory Visit: Payer: Self-pay | Admitting: *Deleted

## 2020-05-31 ENCOUNTER — Other Ambulatory Visit: Payer: Self-pay

## 2020-05-31 ENCOUNTER — Encounter: Payer: Self-pay | Admitting: Emergency Medicine

## 2020-05-31 ENCOUNTER — Emergency Department
Admission: EM | Admit: 2020-05-31 | Discharge: 2020-06-01 | Disposition: A | Discharge status: Home / Self Care | Payer: PPO | Attending: Emergency Medicine | Admitting: Emergency Medicine

## 2020-05-31 ENCOUNTER — Emergency Department: Payer: PPO

## 2020-05-31 DIAGNOSIS — Z85828 Personal history of other malignant neoplasm of skin: Secondary | ICD-10-CM | POA: Insufficient documentation

## 2020-05-31 DIAGNOSIS — Z7984 Long term (current) use of oral hypoglycemic drugs: Secondary | ICD-10-CM | POA: Diagnosis not present

## 2020-05-31 DIAGNOSIS — R112 Nausea with vomiting, unspecified: Secondary | ICD-10-CM | POA: Diagnosis not present

## 2020-05-31 DIAGNOSIS — U071 COVID-19: Secondary | ICD-10-CM

## 2020-05-31 DIAGNOSIS — I10 Essential (primary) hypertension: Secondary | ICD-10-CM | POA: Diagnosis not present

## 2020-05-31 DIAGNOSIS — Z8616 Personal history of COVID-19: Secondary | ICD-10-CM | POA: Insufficient documentation

## 2020-05-31 DIAGNOSIS — E860 Dehydration: Secondary | ICD-10-CM | POA: Diagnosis not present

## 2020-05-31 DIAGNOSIS — E119 Type 2 diabetes mellitus without complications: Secondary | ICD-10-CM | POA: Diagnosis not present

## 2020-05-31 DIAGNOSIS — R059 Cough, unspecified: Secondary | ICD-10-CM | POA: Diagnosis not present

## 2020-05-31 DIAGNOSIS — J45909 Unspecified asthma, uncomplicated: Secondary | ICD-10-CM | POA: Insufficient documentation

## 2020-05-31 DIAGNOSIS — Z79899 Other long term (current) drug therapy: Secondary | ICD-10-CM | POA: Insufficient documentation

## 2020-05-31 DIAGNOSIS — R531 Weakness: Secondary | ICD-10-CM | POA: Diagnosis not present

## 2020-05-31 DIAGNOSIS — J189 Pneumonia, unspecified organism: Secondary | ICD-10-CM | POA: Diagnosis not present

## 2020-05-31 LAB — COMPREHENSIVE METABOLIC PANEL
ALT: 17 U/L (ref 0–44)
AST: 22 U/L (ref 15–41)
Albumin: 4 g/dL (ref 3.5–5.0)
Alkaline Phosphatase: 79 U/L (ref 38–126)
Anion gap: 11 (ref 5–15)
BUN: 12 mg/dL (ref 8–23)
CO2: 30 mmol/L (ref 22–32)
Calcium: 9.6 mg/dL (ref 8.9–10.3)
Chloride: 100 mmol/L (ref 98–111)
Creatinine, Ser: 0.61 mg/dL (ref 0.44–1.00)
GFR, Estimated: >60 mL/min (ref >60)
Glucose, Bld: 159 mg/dL — ABNORMAL HIGH (ref 70–99)
Potassium: 3.2 mmol/L — ABNORMAL LOW (ref 3.5–5.1)
Sodium: 141 mmol/L (ref 135–145)
Total Bilirubin: 0.6 mg/dL (ref 0.3–1.2)
Total Protein: 7.4 g/dL (ref 6.5–8.1)

## 2020-05-31 LAB — URINALYSIS, COMPLETE (UACMP) WITH MICROSCOPIC
Bacteria, UA: NONE SEEN
Bilirubin Urine: NEGATIVE
Glucose, UA: >=500 mg/dL — AB
Hgb urine dipstick: NEGATIVE
Ketones, ur: NEGATIVE mg/dL
Leukocytes,Ua: NEGATIVE
Nitrite: NEGATIVE
Protein, ur: 100 mg/dL — AB
Specific Gravity, Urine: 1.028 (ref 1.005–1.030)
pH: 6 (ref 5.0–8.0)

## 2020-05-31 LAB — CBC
HCT: 34.4 % — ABNORMAL LOW (ref 36.0–46.0)
Hemoglobin: 11.6 g/dL — ABNORMAL LOW (ref 12.0–15.0)
MCH: 26.1 pg (ref 26.0–34.0)
MCHC: 33.7 g/dL (ref 30.0–36.0)
MCV: 77.3 fL — ABNORMAL LOW (ref 80.0–100.0)
Platelets: 356 10*3/uL (ref 150–400)
RBC: 4.45 MIL/uL (ref 3.87–5.11)
RDW: 13.5 % (ref 11.5–15.5)
WBC: 11 10*3/uL — ABNORMAL HIGH (ref 4.0–10.5)
nRBC: 0 % (ref 0.0–0.2)

## 2020-05-31 LAB — LIPASE, BLOOD: Lipase: 37 U/L (ref 11–51)

## 2020-05-31 LAB — TROPONIN I (HIGH SENSITIVITY): Troponin I (High Sensitivity): 52 ng/L — ABNORMAL HIGH (ref <18)

## 2020-05-31 MED ORDER — LACTATED RINGERS IV BOLUS
1000.0000 mL | Freq: Once | INTRAVENOUS | Status: AC
  Administered 2020-05-31: 1000 mL via INTRAVENOUS

## 2020-05-31 MED ORDER — ACETAMINOPHEN 500 MG PO TABS
1000.0000 mg | ORAL_TABLET | Freq: Once | ORAL | Status: AC
  Administered 2020-05-31: 1000 mg via ORAL
  Filled 2020-05-31: qty 2

## 2020-05-31 MED ORDER — KETOROLAC TROMETHAMINE 30 MG/ML IJ SOLN
15.0000 mg | Freq: Once | INTRAMUSCULAR | Status: AC
  Administered 2020-05-31: 15 mg via INTRAVENOUS
  Filled 2020-05-31: qty 1

## 2020-05-31 MED ORDER — POTASSIUM CHLORIDE CRYS ER 20 MEQ PO TBCR
40.0000 meq | EXTENDED_RELEASE_TABLET | Freq: Once | ORAL | Status: AC
  Administered 2020-05-31: 40 meq via ORAL

## 2020-05-31 MED ORDER — ONDANSETRON HCL 4 MG/2ML IJ SOLN
4.0000 mg | Freq: Once | INTRAMUSCULAR | Status: AC
  Administered 2020-05-31: 4 mg via INTRAVENOUS
  Filled 2020-05-31: qty 2

## 2020-05-31 NOTE — ED Provider Notes (Signed)
[facility] Emergency Department Provider Note ____________________________________________   Event Date/Time   First MD Initiated Contact with Patient 05/31/20 2035     (approximate)  I have reviewed the triage vital signs and the nursing notes.  HISTORY  Chief Complaint Nausea, Emesis, Headache, and weakness   HPI Katie Buckley is a 69 y.o. femalewho presents to the ED for evaluation of nausea and vomiting.  Chart review indicates history of DM on multiple oral agents, HTN, HLD, GERD. Patient reports that she was diagnosed with Covid on 12/4 and stayed at home under quarantine.  She reports returning to work today for the first time and experienced a couple episodes of nonbloody nonbilious emesis in the car on the way to work this morning, and another 2 episodes while at work.  Due to this, and continued myalgias and nonproductive cough, she presents to the ED for evaluation.  She reports primary concern for a pneumonia.  She reports persistent chills and diffuse myalgias that been present for the past 2 weeks.  She reports continued nonproductive cough, but denies any mucus production.  She reports diffuse myalgias and generalized sensation of feeling poorly with associated nausea right now.  She reports her teeth hurt and that everything hurts.  She was report decreased urinary output without dysuria and reports up to 3 episodes of watery diarrhea per day for the past few days.  Denies focal substernal chest pain, syncope, vaginal discharge or bleeding, dysuria, falls.  Past Medical History:  Diagnosis Date  . Anxiety   . Depression   . Diabetes mellitus without complication ([place])   . Frequent sinus infections   . GERD (gastroesophageal reflux disease) 04/21/2015   History of Barrett's esophagus   . Heart murmur   . Hyperchloremia   . Hypertension   . MRSA (methicillin resistant staph aureus) culture positive 2018   history of, in hand  .  Neuromuscular disorder (HCC)    neuropathy  . Palpitations   . Seasonal allergies   . Skin cancer    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    Patient Active Problem List   Diagnosis Date Noted  . COVID-19 05/19/2020  . MRSA cellulitis 04/19/2018  . Decreased libido 02/04/2018  . Uncontrolled type 2 diabetes mellitus with diabetic neuropathy, without long-term current use of insulin ([place]) 04/20/2017  . H/O adenomatous polyp of colon 08/20/2015  . Rupture of implant of right breast 05/18/2015  . Anxiety disorder 04/26/2015  . Allergic rhinitis 04/21/2015  . Anemia 04/21/2015  . Asthma 04/21/2015  . 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 Chronic infection of sinus 04/21/2015  . Depression 04/21/2015  . GERD (gastroesophageal reflux disease) 04/21/2015  . Headache 04/21/2015  . Menopausal symptoms 04/21/2015  . Obesity 04/21/2015  . Palpitations 04/21/2015  . Vitamin B12 deficiency 04/21/2015  . Aortic stenosis 01/21/2013  . Diastolic dysfunction 76/22/6333  . Diabetes mellitus out of control ([place]) 06/12/2006  . Barrett esophagus 06/13/2003  . Essential hypertension 06/12/1998  . Genital herpes 06/12/1998  . Hyperlipidemia, mixed 06/12/1998    Past Surgical History:  Procedure Laterality Date  . AUGMENTATION MAMMAPLASTY Bilateral    breast implants  . Bone Spur  2007   foot  . CARDIAC CATHETERIZATION    . Carpal Tunnel Symdrome  2008   as repeated in 2009  . CATARACT EXTRACTION W/PHACO Left 01/16/2019   Procedure: CATARACT EXTRACTION PHACO AND INTRAOCULAR LENS PLACEMENT ([place]) LEFT DIABETES VISION BLUE;  Surgeon: Marchia Meiers,  MD;  Location: [facility] ORS;  Service: Ophthalmology;  Laterality: Left;  [place]  00:47 CDE 6.62 Fluid pack lot # [id number]  . CATARACT EXTRACTION W/PHACO Right 02/06/2019   Procedure: CATARACT EXTRACTION PHACO AND INTRAOCULAR LENS PLACEMENT (IOC);  Surgeon: Marchia Meiers, MD;  Location: [facility] ORS;  Service: Ophthalmology;  Laterality: Right;  [place] 00:54.6 CDE  6.52 FLUID PACK LOT # [id number]  . CHOLECYSTECTOMY  2007  . COLONOSCOPY WITH PROPOFOL N/A 11/17/2015   Procedure: COLONOSCOPY WITH PROPOFOL;  Surgeon: Manya Silvas, MD;  Location: [facility] ENDOSCOPY;  Service: Endoscopy;  Laterality: N/A;  . ESOPHAGOGASTRODUODENOSCOPY (EGD) WITH PROPOFOL N/A 11/17/2015   Procedure: ESOPHAGOGASTRODUODENOSCOPY (EGD) WITH PROPOFOL;  Surgeon: Manya Silvas, MD;  Location: [facility] ENDOSCOPY;  Service: Endoscopy;  Laterality: N/A;  . EYE SURGERY Left    cataract extraction  . FOOT SURGERY    . MANDIBLE SURGERY    . NASAL SINUS SURGERY  03/2013   Multile surgery Dr. Carlis Abbott. 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 Release of Trigger Finger Right 10/2010   Dr. Tamala Julian  . TONSILLECTOMY  1960    Prior to Admission medications   Medication Sig Start Date End Date Taking? Authorizing Provider  Biotin 10 MG CAPS Take 10 mg by mouth daily after lunch.   Yes [provider]  buPROPion (WELLBUTRIN SR) 100 MG 12 hr tablet Take 1 tablet by mouth 2 (two) times daily. 05/25/20  Yes [provider]  butalbital-acetaminophen-caffeine (FIORICET) 50-325-40 MG tablet TAKE 1 TABLET BY MOUTH EVERY 6 HOURS AS NEEDED FOR HEADACHE. 05/26/20  Yes [name], MD  Cholecalciferol (VITAMIN D3 PO) Take 1,000 Units by mouth daily with lunch.    Yes [provider]  esomeprazole (NEXIUM) 20 MG capsule Take 40 mg by mouth daily before breakfast.    Yes [provider]  ezetimibe (ZETIA) 10 MG tablet TAKE 1 TABLET BY MOUTH EVERYDAY AT BEDTIME 02/23/20  Yes [name], MD  fexofenadine (ALLEGRA) 180 MG tablet Take 180 mg by mouth daily as needed for allergies or rhinitis.   Yes [provider]  gabapentin (NEURONTIN) 300 MG capsule TAKE 2 CAPSULES (600 MG TOTAL) BY MOUTH 2 (TWO) TIMES DAILY. MORNING & SUPPER 04/22/20  Yes [name], MD  glipiZIDE (GLUCOTROL) 10 MG tablet TAKE 1 TABLET (10 MG TOTAL) BY MOUTH DAILY BEFORE BREAKFAST. 12/07/19  Yes  [name], MD  HYDROcodone-homatropine [facility]) 5-1.5 MG/5ML syrup Take 5 mLs by mouth every 8 (eight) hours as needed for cough. 05/19/20  Yes [name], MD  hydrOXYzine (ATARAX/VISTARIL) 50 MG tablet Take 50 mg by mouth 2 (two) times daily. 01/01/18  Yes [provider]  ketotifen (ZADITOR) 0.025 % ophthalmic solution Place 1 drop into both eyes 2 (two) times daily as needed (dry/irritated eyes.).   Yes [provider]  Lancet Devices MISC    Yes [provider]  meloxicam (MOBIC) 15 MG tablet Take 1 tablet (15 mg total) by mouth daily. Patient taking differently: Take 15 mg by mouth daily as needed (pain.). 05/07/16  Yes Hagler, Jami L, PA-C  metFORMIN (GLUCOPHAGE-XR) 500 MG 24 hr tablet TAKE 1 TABLET BY MOUTH EVERY DAY WITH BREAKFAST 08/26/19  Yes [name], MD  metoprolol-hydrochlorothiazide (LOPRESSOR HCT) 100-25 MG tablet TAKE 1/2 TABLET BY MOUTH EVERY DAY 03/15/20  Yes [name], MD  montelukast (SINGULAIR) 10 MG tablet TAKE 1 TABLET BY MOUTH EVERY DAY 05/18/20  Yes [name], MD  ondansetron [facility]) 4  MG tablet TAKE 1 TABLET BY MOUTH EVERY 8 HOURS AS NEEDED 05/26/20  Yes [name], MD  rosuvastatin (CRESTOR) 20 MG tablet Take 1 tablet (20 mg total) by mouth daily. 06/24/19  Yes [name], MD  Semaglutide, 1 MG/DOSE, (OZEMPIC, 1 MG/DOSE,) 2 MG/1.5ML SOPN Inject 0.75 mLs (1 mg total) into the skin once a week. 11/19/19  Yes [name], MD  sertraline (ZOLOFT) 100 MG tablet Take 200 mg by mouth daily.    Yes [provider]  traZODone (DESYREL) 50 MG tablet Take 50-100 mg by mouth at bedtime as needed for sleep.    Yes [provider]  valACYclovir (VALTREX) 1000 MG tablet Take 1 tablet daily for 5 days as needed 02/17/16  Yes Fisher, Kirstie Peri, MD  ARIPiprazole (ABILIFY) 5 MG tablet Take 5 mg by mouth daily. Patient not taking: No sig reported    [provider]  fluticasone (FLONASE) 50  MCG/ACT nasal spray PLACE 2 SPRAYS INTO BOTH NOSTRILS DAILY AS NEEDED FOR ALLERGIES. Patient not taking: No sig reported 12/21/19   [name], MD  JARDIANCE 10 MG TABS tablet TAKE 10 MG BY MOUTH DAILY. Patient not taking: No sig reported 09/11/19   [name], MD    Allergies Doxycycline and Biaxin [clarithromycin]  Family History  Problem Relation Age of Onset  . Diabetes Father   . Lung cancer Mother   . Breast cancer Maternal Aunt   . Irritable bowel syndrome Sister   . Colon cancer Brother     Social History Social History   Tobacco Use  . Smoking status: Never Smoker  . Smokeless tobacco: Never Used  Vaping Use  . Vaping Use: Never used  Substance Use Topics  . Alcohol use: No  . Drug use: No    Review of Systems  Constitutional: Positive for chills and generalized weakness. Eyes: No visual changes. ENT: No sore throat. Cardiovascular: Denies chest pain. Respiratory: Positive for nonproductive cough. Gastrointestinal: No abdominal pain.  No constipation. Positive for nausea, emesis and watery diarrhea. Genitourinary: Negative for dysuria.  Positive for decreased urinary output. Musculoskeletal: Negative for back pain.  Positive for diffuse myalgias. Skin: Negative for rash. Neurological: Negative for focal weakness or numbness.  ____________________________________________   PHYSICAL EXAM:  VITAL SIGNS: Vitals:   05/31/20 2045 05/31/20 2214  BP: (!) 165/87 (!) 155/87  Pulse: 82 80  Resp: 20 16  Temp: 98.8 F (37.1 C)   SpO2: 100% 97%     Constitutional: Alert and oriented. Well appearing and in no acute distress.  Pleasant and conversational in full sentences. Eyes: Conjunctivae are normal. PERRL. EOMI. Head: Atraumatic. Nose: No congestion/rhinnorhea. Mouth/Throat: Mucous membranes are dry.  Oropharynx non-erythematous. Neck: No stridor. No cervical spine tenderness to palpation. Cardiovascular: Normal rate, regular rhythm. Grossly  normal heart sounds.  Good peripheral circulation. Respiratory: Normal respiratory effort.  No retractions. Lungs CTAB. Gastrointestinal: Soft , nondistended, nontender to palpation. No CVA tenderness. Musculoskeletal: No lower extremity tenderness nor edema.  No joint effusions. No signs of acute trauma. Neurologic:  Normal speech and language. No gross focal neurologic deficits are appreciated. No gait instability noted. Skin:  Skin is warm, dry and intact. No rash noted. Psychiatric: Mood and affect are normal. Speech and behavior are normal.  ____________________________________________   LABS (all labs ordered are listed, but only abnormal results are displayed)  Labs Reviewed  COMPREHENSIVE METABOLIC PANEL - Abnormal; Notable for the following components:  Result Value   Potassium 3.2 (*)    Glucose, Bld 159 (*)    All other components within normal limits  CBC - Abnormal; Notable for the following components:   WBC 11.0 (*)    Hemoglobin 11.6 (*)    HCT 34.4 (*)    MCV 77.3 (*)    All other components within normal limits  URINALYSIS, COMPLETE (UACMP) WITH MICROSCOPIC - Abnormal; Notable for the following components:   Color, Urine YELLOW (*)    APPearance CLEAR (*)    Glucose, UA >=500 (*)    Protein, ur 100 (*)    All other components within normal limits  TROPONIN I (HIGH SENSITIVITY) - Abnormal; Notable for the following components:   Troponin I (High Sensitivity) 52 (*)    All other components within normal limits  LIPASE, BLOOD   ____________________________________________  12 Lead EKG  Sinus rhythm, rate of 75 bpm.  Normal axis.  Normal intervals.  Anterior Q waves.  No evidence of acute ischemia. ____________________________________________  RADIOLOGY  ED MD interpretation: 2 view CXR reviewed by me with mild bilateral patchy opacities consistent with COVID-19  Official radiology report(s): DG Chest 2 View  Result Date: 05/31/2020 CLINICAL DATA:   Cough and congestion EXAM: CHEST - 2 VIEW COMPARISON:  02/08/2014 FINDINGS: Bilateral hazy airspace opacities are noted. There is no pneumothorax. No large pleural effusion. The heart size is unremarkable. Aortic calcifications are noted. There is no definite acute osseous abnormality. IMPRESSION: Bilateral hazy airspace opacities compatible with multifocal pneumonia (viral or bacterial). Electronically Signed   By: Constance Holster M.D.   On: 05/31/2020 15:17    ____________________________________________   PROCEDURES and INTERVENTIONS  Procedure(s) performed (including Critical Care):  .1-3 Lead EKG Interpretation Performed by: Vladimir Crofts, MD Authorized by: Vladimir Crofts, MD     Interpretation: normal     ECG rate:  76   ECG rate assessment: normal     Rhythm: sinus rhythm     Ectopy: none     Conduction: normal      Medications  lactated ringers bolus 1,000 mL (1,000 mLs Intravenous New Bag/Given 05/31/20 2136)  ondansetron (ZOFRAN) injection 4 mg (4 mg Intravenous Given 05/31/20 2136)  acetaminophen (TYLENOL) tablet 1,000 mg (1,000 mg Oral Given 05/31/20 2136)  ketorolac (TORADOL) 30 MG/ML injection 15 mg (15 mg Intravenous Given 05/31/20 2137)  potassium chloride SA (KLOR-CON) CR tablet 40 mEq (40 mEq Oral Given 05/31/20 2245)    ____________________________________________   MDM / ED COURSE   69 year old woman 2 and half weeks into her Covid 74 course presents to the ED after multiple episodes of emesis and generalized weakness, signs of dehydration.  Normal vitals on room air.  Exam with stigmata of dehydration, otherwise unremarkable.  Patient is in no distress and has no evidence of neurovascular deficits.  Blood work with mild hypokalemia, that was repleted orally.  Nonischemic EKG and high-sensitivity troponin in the 50s.  Patient reports improving symptoms after rehydration, antiemetics and Tylenol/Toradol.  Will sign patient out to oncoming provider to follow-up  on repeat troponin and patient's clinical status.  If her troponin is downtrending and she is continuing to feel well, then I suspect outpatient management would be reasonable  Clinical Course as of 05/31/20 2250  Mon May 31, 2020  2247 Reassessed.  Patient resting comfortably on her side.  She reports feeling better.  Denies chest pain. [DS]    Clinical Course User Index [DS] Vladimir Crofts, MD    ____________________________________________  FINAL CLINICAL IMPRESSION(S) / ED DIAGNOSES  Final diagnoses:  Non-intractable vomiting with nausea, unspecified vomiting type  Dehydration     ED Discharge Orders    None       Saramarie Stinger   Note:  This document was prepared using Dragon voice recognition software and may include unintentional dictation errors.   Vladimir Crofts, MD 05/31/20 [phone]

## 2020-05-31 NOTE — Telephone Encounter (Signed)
Patient completed 10th day of isolation for positive Covid yesterday and returned to work this morning. Continues to have chills/sweats/headache. Vomited X2 this morning after sips of water and sips of tea.Had abdominal cramping and episode of diarrhea yesterday. None today. Reported chest tightness that is new today. This is noticeable during our conversation.  Voided without difficulty this morning.  Advised ED at this time for SOB and possible dehydration. Patient stated she would go now.  Reason for Disposition . [1] MODERATE vomiting (e.g., 3 - 5 times/day) AND [2] age > 60 years  Answer Assessment - Initial Assessment Questions 1. VOMITING SEVERITY: "How many times have you vomited in the past 24 hours?"     - MILD:  1 - 2 times/day    - MODERATE: 3 - 5 times/day, decreased oral intake without significant weight loss or symptoms of dehydration    - SEVERE: 6 or more times/day, vomits everything or nearly everything, with significant weight loss, symptoms of dehydration     Vomited twice this morning 2. ONSET: "When did the vomiting begin?"      This morning 3. FLUIDS: "What fluids or food have you vomited up today?" "Have you been able to keep any fluids down?"     Sips of water and sips of tea but vomited afterwards 4. ABDOMINAL PAIN: "Are your having any abdominal pain?" If yes : "How bad is it and what does it feel like?" (e.g., crampy, dull, intermittent, constant)      Yesterday abdominal cramps and episode of diarrhea 5. DIARRHEA: "Is there any diarrhea?" If Yes, ask: "How many times today?"      Yesterday then resolved 6. CONTACTS: "Is there anyone else in the family with the same symptoms?"     no 7. CAUSE: "What do you think is causing your vomiting?"     unsure 8. HYDRATION STATUS: "Any signs of dehydration?" (e.g., dry mouth [not only dry lips], too weak to stand) "When did you last urinate?"    Dry mouth dry skin 9. OTHER SYMPTOMS: "Do you have any other symptoms?" (e.g.,  fever, headache, vertigo, vomiting blood or coffee grounds, recent head injury)     Headache, chills and sweating 10. PREGNANCY: "Is there any chance you are pregnant?" "When was your last menstrual period?"       na  Protocols used: [facility]

## 2020-05-31 NOTE — ED Triage Notes (Signed)
Pt to ER states she was diagnosed with COVID on 05/15/2020.  Pt states she was out of quarantine and tried to return to work today. States she has had n/v today.  States she continues to have headache, chest congestion, cough, and nasal symptoms.

## 2020-06-01 LAB — TROPONIN I (HIGH SENSITIVITY): Troponin I (High Sensitivity): 55 ng/L — ABNORMAL HIGH (ref <18)

## 2020-06-01 MED ORDER — ONDANSETRON 4 MG PO TBDP: 4.0000 mg | ORAL_TABLET | Freq: Three times a day (TID) | ORAL | 0 refills | Status: DC | PRN

## 2020-06-01 NOTE — ED Provider Notes (Signed)
-----------------------------------------   1:31 AM on 06/01/2020 -----------------------------------------  Repeat troponin is largely unchanged.  I spoke to the patient she feels much better than she did upon arrival.  Could very likely be related to her recent Covid diagnosis.  I did discuss with the patient a trial of nausea medication at home.  We will also write a work note for the patient so she can rest and recover at home.  Patient agreeable to plan of care.  Provided my typical Covid return precautions.   Harvest Dark, MD 06/01/20 [phone]

## 2020-06-15 DIAGNOSIS — M50322 Other cervical disc degeneration at C5-C6 level: Secondary | ICD-10-CM | POA: Diagnosis not present

## 2020-06-15 DIAGNOSIS — G959 Disease of spinal cord, unspecified: Secondary | ICD-10-CM | POA: Diagnosis not present

## 2020-06-15 DIAGNOSIS — Z8669 Personal history of other diseases of the nervous system and sense organs: Secondary | ICD-10-CM | POA: Diagnosis not present

## 2020-06-16 ENCOUNTER — Other Ambulatory Visit: Payer: Self-pay | Admitting: Neurosurgery

## 2020-06-19 ENCOUNTER — Other Ambulatory Visit: Payer: Self-pay | Admitting: Family Medicine

## 2020-06-19 DIAGNOSIS — E1140 Type 2 diabetes mellitus with diabetic neuropathy, unspecified: Secondary | ICD-10-CM

## 2020-06-19 DIAGNOSIS — IMO0002 Reserved for concepts with insufficient information to code with codable children: Secondary | ICD-10-CM

## 2020-06-23 ENCOUNTER — Inpatient Hospital Stay: Admission: RE | Admit: 2020-06-23 | Payer: PPO | Source: Ambulatory Visit

## 2020-06-24 ENCOUNTER — Other Ambulatory Visit: Payer: PPO

## 2020-06-25 ENCOUNTER — Telehealth: Payer: Self-pay

## 2020-06-25 ENCOUNTER — Other Ambulatory Visit: Payer: PPO

## 2020-06-25 DIAGNOSIS — G4700 Insomnia, unspecified: Secondary | ICD-10-CM | POA: Diagnosis not present

## 2020-06-25 DIAGNOSIS — F411 Generalized anxiety disorder: Secondary | ICD-10-CM | POA: Diagnosis not present

## 2020-06-25 DIAGNOSIS — F331 Major depressive disorder, recurrent, moderate: Secondary | ICD-10-CM | POA: Diagnosis not present

## 2020-06-25 DIAGNOSIS — R41840 Attention and concentration deficit: Secondary | ICD-10-CM | POA: Diagnosis not present

## 2020-06-25 NOTE — Chronic Care Management (AMB) (Signed)
Chronic Care Management Pharmacy Assistant   Name: Katie Buckley  MRN: [id number] DOB: [birth year]  Reason for Encounter: Disease State - Diabetes Adherence Call  Patient Questions:   1.  Have you seen any other providers since your last visit? Yes. 06/15/2020 Mila Palmer, Neurosurgery 05/31/2020 Vladimir Crofts, MD Nausea, Headache, Emesis and Weakness. 05/19/2020 - Lelon Huh , MD Cough  05/17/2020 [facility] 05/12/2020 - 03/15/2020 - [facility]     2.  Any changes in your medicines or health?      PCP : [name], MD  Allergies:   Allergies  Allergen Reactions   Doxycycline Other (See Comments)    Emotional changes/anxious   Biaxin [Clarithromycin]     GI upset, and bad taste in mouth.    Medications: Outpatient Encounter Medications as of 06/25/2020  Medication Sig   ARIPiprazole (ABILIFY) 5 MG tablet Take 5 mg by mouth daily. (Patient not taking: No sig reported)   Biotin 10 MG CAPS Take 10 mg by mouth daily after lunch.   buPROPion (WELLBUTRIN SR) 100 MG 12 hr tablet Take 1 tablet by mouth 2 (two) times daily.   butalbital-acetaminophen-caffeine (FIORICET) 50-325-40 MG tablet TAKE 1 TABLET BY MOUTH EVERY 6 HOURS AS NEEDED FOR HEADACHE.   Cholecalciferol (VITAMIN D3 PO) Take 1,000 Units by mouth daily with lunch.    esomeprazole (NEXIUM) 40 MG capsule Take 40 mg by mouth daily before breakfast.    ezetimibe (ZETIA) 10 MG tablet TAKE 1 TABLET BY MOUTH EVERYDAY AT BEDTIME   fexofenadine (ALLEGRA) 180 MG tablet Take 180 mg by mouth daily as needed for allergies or rhinitis.   fluticasone (FLONASE) 50 MCG/ACT nasal spray PLACE 2 SPRAYS INTO BOTH NOSTRILS DAILY AS NEEDED FOR ALLERGIES. (Patient not taking: No sig reported)   gabapentin (NEURONTIN) 300 MG capsule TAKE 2 CAPSULES (600 MG TOTAL) BY MOUTH 2 (TWO) TIMES DAILY. MORNING & SUPPER   glipiZIDE (GLUCOTROL) 10 MG tablet TAKE 1 TABLET (10 MG  TOTAL) BY MOUTH DAILY BEFORE BREAKFAST.   HYDROcodone-homatropine (HYCODAN) 5-1.5 MG/5ML syrup Take 5 mLs by mouth every 8 (eight) hours as needed for cough.   hydrOXYzine (ATARAX/VISTARIL) 50 MG tablet Take 50 mg by mouth 2 (two) times daily.   JARDIANCE 10 MG TABS tablet TAKE 10 MG BY MOUTH DAILY. (Patient not taking: No sig reported)   ketotifen (ZADITOR) 0.025 % ophthalmic solution Place 1 drop into both eyes 2 (two) times daily as needed (dry/irritated eyes.).   Lancet Devices MISC    meloxicam (MOBIC) 15 MG tablet Take 1 tablet (15 mg total) by mouth daily. (Patient taking differently: Take 15 mg by mouth daily as needed (pain.).)   metFORMIN (GLUCOPHAGE-XR) 500 MG 24 hr tablet TAKE 1 TABLET BY MOUTH EVERY DAY WITH BREAKFAST   metoprolol-hydrochlorothiazide (LOPRESSOR HCT) 100-25 MG tablet TAKE 1/2 TABLET BY MOUTH EVERY DAY   montelukast (SINGULAIR) 10 MG tablet TAKE 1 TABLET BY MOUTH EVERY DAY   ondansetron (ZOFRAN ODT) 4 MG disintegrating tablet Take 1 tablet (4 mg total) by mouth every 8 (eight) hours as needed for nausea or vomiting.   ondansetron (ZOFRAN) 4 MG tablet TAKE 1 TABLET BY MOUTH EVERY 8 HOURS AS NEEDED   rosuvastatin (CRESTOR) 20 MG tablet TAKE 1 TABLET BY MOUTH EVERY DAY   Semaglutide, 1 MG/DOSE, (OZEMPIC, 1 MG/DOSE,) 2 MG/1.5ML SOPN Inject 0.75 mLs (1 mg total) into the skin once a week.   sertraline (ZOLOFT)  100 MG tablet Take 100 mg by mouth in the morning and at bedtime.   traZODone (DESYREL) 50 MG tablet Take 50-100 mg by mouth at bedtime as needed for sleep.    valACYclovir (VALTREX) 1000 MG tablet Take 1 tablet daily for 5 days as needed   No facility-administered encounter medications on file as of 06/25/2020.    Current Diagnosis: Patient Active Problem List   Diagnosis Date Noted   COVID-19 05/19/2020   MRSA cellulitis 04/19/2018   Decreased libido 02/04/2018   Uncontrolled type 2 diabetes mellitus with diabetic neuropathy, without  long-term current use of insulin ([place]) 04/20/2017   H/O adenomatous polyp of colon 08/20/2015   Rupture of implant of right breast 05/18/2015   Anxiety disorder 04/26/2015   Allergic rhinitis 04/21/2015   Anemia 04/21/2015   Asthma 04/21/2015   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/21/2015   Chronic infection of sinus 04/21/2015   Depression 04/21/2015   GERD (gastroesophageal reflux disease) 04/21/2015   Headache 04/21/2015   Menopausal symptoms 04/21/2015   Obesity 04/21/2015   Palpitations 04/21/2015   Vitamin B12 deficiency 04/21/2015   Aortic stenosis 38/46/6599   Diastolic dysfunction 35/70/1779   Diabetes mellitus out of control ([place]) 06/12/2006   Barrett esophagus 06/13/2003   Essential hypertension 06/12/1998   Genital herpes 06/12/1998   Hyperlipidemia, mixed 06/12/1998   Recent Relevant Labs: Lab Results  Component Value Date/Time   HGBA1C 9.6 (A) 11/19/2019 10:15 AM   HGBA1C 11.8 (H) 06/17/2019 03:44 PM   HGBA1C 11.2 (A) 11/13/2017 04:45 PM   HGBA1C 8.5 07/13/2016 12:00 AM   HGBA1C 8.1 (A) 07/22/2014 12:00 AM   HGBA1C 9.2 (H) 03/27/2013 06:21 PM    Kidney Function Lab Results  Component Value Date/Time   CREATININE 0.61 05/31/2020 02:31 PM   CREATININE 0.79 11/19/2019 09:58 AM   CREATININE 0.61 05/01/2017 08:42 AM   CREATININE 0.87 02/08/2014 05:17 PM   CREATININE 0.63 04/30/2013 05:24 AM   GFRNONAA >60 05/31/2020 02:31 PM   GFRNONAA 95 05/01/2017 08:42 AM   GFRAA 89 11/19/2019 09:58 AM   GFRAA 110 05/01/2017 08:42 AM    Current antihyperglycemic regimen:  o Glipizide 10 mg take one tablet a day o Metoprolol - Hydrochlorothiazide 25 mg -1/2 tablet a day o   What recent interventions/DTPs have been made to improve glycemic control:  o Patient states she has been taking her medication as directed.    Have there been any recent hospitalizations or ED visits since last visit with CPP? No   Patient denies hypoglycemic symptoms, including  Pale, Sweaty, Shaky, Hungry, Nervous/irritable.  Patient denies hyperglycemic symptoms, including blurry vision, excessive thirst, fatigue, polyuria and weakness   How often are you checking your blood sugar? Patient states she checks blood sugars three time a week.   What are your blood sugars ranging?  o Fasting: None o Before meals: None o After meals: None o Bedtime: None  During the week, how often does your blood glucose drop below 70? No   Are you checking your feet daily/regularly? Yes, Patient states she does not have any problems with feet, no pain, no open sores.  Adherence Review: Is the patient currently on a STATIN medication? No Is the patient currently on ACE/ARB medication? No Does the patient have >5 day gap between last estimated fill dates? No    Follow-Up:  Pharmacist Review - Patient states she had a episode last week where she seen  black spots that lasted a few minutes. I ask patient  if she call her Optometrists, she states she did not, but she will call today.  I told her that I would send message to Daron Offer, CPP so he could discuss with Dr. Caryn Section. Patient voiced understanding. Patient did not check blood sugars at time of episode, states she has not had any more problems or episodes with eyes.  Daron Offer CPP Notified   Judithann Sheen, [facility] Clinical Pharmacist Assistant [phone]

## 2020-06-30 ENCOUNTER — Other Ambulatory Visit: Payer: Self-pay | Admitting: Family Medicine

## 2020-06-30 DIAGNOSIS — J301 Allergic rhinitis due to pollen: Secondary | ICD-10-CM

## 2020-06-30 DIAGNOSIS — E113299 Type 2 diabetes mellitus with mild nonproliferative diabetic retinopathy without macular edema, unspecified eye: Secondary | ICD-10-CM | POA: Diagnosis not present

## 2020-06-30 DIAGNOSIS — Z961 Presence of intraocular lens: Secondary | ICD-10-CM | POA: Diagnosis not present

## 2020-06-30 LAB — HM DIABETES EYE EXAM

## 2020-07-01 ENCOUNTER — Telehealth: Payer: Self-pay | Admitting: Family Medicine

## 2020-07-01 NOTE — Telephone Encounter (Signed)
Copied from [place] [id number]. Topic: Medicare AWV >> Jul 01, 2020  1:55 PM Cher Nakai R wrote: Reason for CRM:  Left message for patient to call back and schedule Medicare Annual Wellness Visit (AWV) in office.   If not able to come in the office, please offer to do virtually or by telephone.   No hx of AWV - AWV-I eligible as of 11/11/2018   Please schedule at anytime with [facility] Health Advisor.   40 minute appointment  Any questions, please contact me at [phone]

## 2020-07-03 ENCOUNTER — Other Ambulatory Visit: Payer: Self-pay | Admitting: Family Medicine

## 2020-07-07 ENCOUNTER — Other Ambulatory Visit: Payer: Self-pay | Admitting: Family Medicine

## 2020-07-07 NOTE — Telephone Encounter (Signed)
Previous electronic prescription was not confirmed as being rec'd by pharmacy.  Reordered #90/ 0 refills.   Requested Prescriptions  Pending Prescriptions Disp Refills  . ezetimibe (ZETIA) 10 MG tablet [Pharmacy Med Name: EZETIMIBE 10 MG TABLET] 90 tablet 0    Sig: TAKE 1 TABLET BY MOUTH EVERYDAY AT BEDTIME     Cardiovascular:  Antilipid - Sterol Transport Inhibitors Failed - 07/07/2020 11:06 AM      Failed - Total Cholesterol in normal range and within 360 days    Cholesterol, Total  Date Value Ref Range Status  11/19/2019 229 (H) 100 - 199 mg/dL Final   Cholesterol  Date Value Ref Range Status  03/28/2013 244 (H) 0 - 200 mg/dL Final         Failed - LDL in normal range and within 360 days    Ldl Cholesterol, Calc  Date Value Ref Range Status  03/28/2013 125 (H) 0 - 100 mg/dL Final   LDL Cholesterol (Calc)  Date Value Ref Range Status  05/01/2017 104 (H) mg/dL (calc) Final    Comment:    Reference range: <100 . Desirable range <100 mg/dL for primary prevention;   <70 mg/dL for patients with CHD or diabetic patients  with > or = 2 CHD risk factors. Marland Kitchen LDL-C is now calculated using the Martin-Hopkins  calculation, which is a validated novel method providing  better accuracy than the Friedewald equation in the  estimation of LDL-C.  Cresenciano Genre et al. Annamaria Helling. 5809;983(38): 2061-2068  (http://education.[employer].com/faq/FAQ164)    LDL Chol Calc (NIH)  Date Value Ref Range Status  11/19/2019 104 (H) 0 - 99 mg/dL Final         Failed - Triglycerides in normal range and within 360 days    Triglycerides  Date Value Ref Range Status  11/19/2019 178 (H) 0 - 149 mg/dL Final  03/28/2013 200 0 - 200 mg/dL Final         Passed - HDL in normal range and within 360 days    HDL Cholesterol  Date Value Ref Range Status  03/28/2013 79 (H) 40 - 60 mg/dL Final   HDL  Date Value Ref Range Status  11/19/2019 95 >39 mg/dL Final         Passed - Valid encounter within last 12  months    Recent Outpatient Visits          1 month ago Cough   [facility] [name], MD   7 months ago Acute non-recurrent pansinusitis   [place], [name]   7 months ago Uncontrolled type 2 diabetes mellitus with diabetic neuropathy, without long-term current use of insulin [facility])   [facility] [name], MD   9 months ago Wound cellulitis   [place], [name]   9 months ago Diarrhea of presumed infectious origin   [facility] [place], [name]

## 2020-07-31 ENCOUNTER — Other Ambulatory Visit: Payer: Self-pay | Admitting: Family Medicine

## 2020-07-31 DIAGNOSIS — E1140 Type 2 diabetes mellitus with diabetic neuropathy, unspecified: Secondary | ICD-10-CM

## 2020-07-31 DIAGNOSIS — IMO0002 Reserved for concepts with insufficient information to code with codable children: Secondary | ICD-10-CM

## 2020-07-31 NOTE — Telephone Encounter (Signed)
Requested Prescriptions  Pending Prescriptions Disp Refills  . OZEMPIC, 1 MG/DOSE, 4 MG/3ML SOPN [Pharmacy Med Name: OZEMPIC 1 MG/DOSE (4 MG/3 ML)]  4    Sig: INJECT 0.75 MLS (1 MG TOTAL) INTO THE SKIN ONCE A WEEK.     Endocrinology:  Diabetes - GLP-1 Receptor Agonists Failed - 07/31/2020  9:53 AM      Failed - HBA1C is between 0 and 7.9 and within 180 days    Hemoglobin A1C  Date Value Ref Range Status  11/19/2019 9.6 (A) 4.0 - 5.6 % Final  07/13/2016 8.5  Final   Hgb A1c MFr Bld  Date Value Ref Range Status  06/17/2019 11.8 (H) 4.8 - 5.6 % Final    Comment:             Prediabetes: 5.7 - 6.4          Diabetes: >6.4          Glycemic control for adults with diabetes: <7.0          Passed - Valid encounter within last 6 months    Recent Outpatient Visits          2 months ago Cough   [facility] [name], MD   8 months ago Acute non-recurrent pansinusitis   [place], [name]   8 months ago Uncontrolled type 2 diabetes mellitus with diabetic neuropathy, without long-term current use of insulin [facility])   [facility] [name], MD   9 months ago Wound cellulitis   [place], [name]   10 months ago Diarrhea of presumed infectious origin   [facility] [place], [name]

## 2020-08-02 ENCOUNTER — Other Ambulatory Visit: Payer: PPO

## 2020-08-06 ENCOUNTER — Other Ambulatory Visit: Admission: RE | Admit: 2020-08-06 | Payer: PPO | Source: Ambulatory Visit

## 2020-08-09 ENCOUNTER — Ambulatory Visit: Admit: 2020-08-09 | Payer: PPO | Admitting: Neurosurgery

## 2020-08-09 SURGERY — ANTERIOR CERVICAL DECOMPRESSION/DISCECTOMY FUSION 1 LEVEL
Anesthesia: Epidural

## 2020-08-26 ENCOUNTER — Telehealth: Payer: Self-pay

## 2020-08-26 NOTE — Telephone Encounter (Signed)
Copied from [place] [id number]. Topic: Appointment Scheduling - Scheduling Inquiry for Clinic >> Aug 26, 2020 12:32 PM Loma Boston wrote: Reason for CRM: Pt has called during lunch stating she does not think she was scheduled correctly for her appt with Dr Caryn Section on 3/23. She states she wanted a complete Medicare Wellness visit with complete memory testing done, states also "everything that a senior would need to be tested for" She has not everhad an AWV visit, but not believe she is requesting a complete memory evaluation. Pls FU with pt to ensure that she is coming in for what her expectations are. [phone]

## 2020-08-27 NOTE — Telephone Encounter (Signed)
Pt is calling to schedule AWV CB- [phone]

## 2020-08-31 NOTE — Telephone Encounter (Signed)
AWV scheduled for 10/27/2020 at 9am. Patient advised. She would like to keep tomorrows appointment for evaluation of memory loss.

## 2020-09-01 ENCOUNTER — Other Ambulatory Visit: Payer: Self-pay

## 2020-09-01 ENCOUNTER — Other Ambulatory Visit: Payer: Self-pay | Admitting: Family Medicine

## 2020-09-01 ENCOUNTER — Encounter: Payer: Self-pay | Admitting: Family Medicine

## 2020-09-01 ENCOUNTER — Ambulatory Visit (INDEPENDENT_AMBULATORY_CARE_PROVIDER_SITE_OTHER): Payer: PPO | Admitting: Family Medicine

## 2020-09-01 VITALS — BP 190/64 | HR 64 | Temp 97.7°F | Resp 18 | Wt 145.2 lb

## 2020-09-01 DIAGNOSIS — E1165 Type 2 diabetes mellitus with hyperglycemia: Secondary | ICD-10-CM

## 2020-09-01 DIAGNOSIS — E538 Deficiency of other specified B group vitamins: Secondary | ICD-10-CM | POA: Diagnosis not present

## 2020-09-01 DIAGNOSIS — E782 Mixed hyperlipidemia: Secondary | ICD-10-CM | POA: Diagnosis not present

## 2020-09-01 DIAGNOSIS — D649 Anemia, unspecified: Secondary | ICD-10-CM | POA: Diagnosis not present

## 2020-09-01 DIAGNOSIS — E1140 Type 2 diabetes mellitus with diabetic neuropathy, unspecified: Secondary | ICD-10-CM | POA: Diagnosis not present

## 2020-09-01 DIAGNOSIS — F419 Anxiety disorder, unspecified: Secondary | ICD-10-CM

## 2020-09-01 DIAGNOSIS — R413 Other amnesia: Secondary | ICD-10-CM

## 2020-09-01 DIAGNOSIS — IMO0002 Reserved for concepts with insufficient information to code with codable children: Secondary | ICD-10-CM

## 2020-09-01 DIAGNOSIS — I10 Essential (primary) hypertension: Secondary | ICD-10-CM

## 2020-09-01 DIAGNOSIS — I350 Nonrheumatic aortic (valve) stenosis: Secondary | ICD-10-CM

## 2020-09-01 NOTE — Progress Notes (Signed)
Established patient visit   Patient: Katie Buckley   DOB: [birth year]   70 y.o. Female  MRN: [id number] Visit Date: 09/01/2020  Today's healthcare provider: Lelon Huh, MD   Chief Complaint  Patient presents with  . Memory Loss   Subjective    HPI  Memory problems: Patient reports having trouble remembering details concerning situations at work. He states she forgets what she has done the previous day and forgets conversations that she has with coworkers and customers. She works at a [employer] and states she has been put on administrative leave due to forgetfulness and she is not allowed to return to work without a medical clearance.   She is also overdue for follow up of multiple chronic medical problems in including dyslipidemia, hypertension, and diabetes. She does not check her blood sugars and blood pressures. She does not take her medications consistently. Has not taken her blood pressure medications this week.       Medications: Outpatient Medications Prior to Visit  Medication Sig  . Biotin 10 MG CAPS Take 10 mg by mouth daily after lunch.  Marland Kitchen buPROPion (WELLBUTRIN SR) 100 MG 12 hr tablet Take 1 tablet by mouth 2 (two) times daily.  . Cholecalciferol (VITAMIN D3 PO) Take 1,000 Units by mouth daily with lunch.   . esomeprazole (NEXIUM) 40 MG capsule Take 40 mg by mouth daily before breakfast.   . ezetimibe (ZETIA) 10 MG tablet TAKE 1 TABLET BY MOUTH EVERYDAY AT BEDTIME  . fexofenadine (ALLEGRA) 180 MG tablet Take 180 mg by mouth daily as needed for allergies or rhinitis.  . fluticasone (FLONASE) 50 MCG/ACT nasal spray PLACE 2 SPRAYS INTO BOTH NOSTRILS DAILY AS NEEDED FOR ALLERGIES.  Marland Kitchen gabapentin (NEURONTIN) 300 MG capsule TAKE 2 CAPSULES (600 MG TOTAL) BY MOUTH 2 (TWO) TIMES DAILY. MORNING & SUPPER  . glipiZIDE (GLUCOTROL) 10 MG tablet TAKE 1 TABLET (10 MG TOTAL) BY MOUTH DAILY BEFORE BREAKFAST.  Marland Kitchen JARDIANCE 10 MG TABS tablet TAKE 10 MG BY MOUTH DAILY.  Marland Kitchen ketotifen  (ZADITOR) 0.025 % ophthalmic solution Place 1 drop into both eyes 2 (two) times daily as needed (dry/irritated eyes.).  Marland Kitchen Lancet Devices MISC   . meloxicam (MOBIC) 15 MG tablet Take 1 tablet (15 mg total) by mouth daily. (Patient taking differently: Take 15 mg by mouth daily as needed (pain.).)  . metFORMIN (GLUCOPHAGE-XR) 500 MG 24 hr tablet TAKE 1 TABLET BY MOUTH EVERY DAY WITH BREAKFAST  . montelukast (SINGULAIR) 10 MG tablet TAKE 1 TABLET BY MOUTH EVERY DAY  . ondansetron (ZOFRAN ODT) 4 MG disintegrating tablet Take 1 tablet (4 mg total) by mouth every 8 (eight) hours as needed for nausea or vomiting.  Marland Kitchen OZEMPIC, 1 MG/DOSE, 4 MG/3ML SOPN INJECT 0.75 MLS (1 MG TOTAL) INTO THE SKIN ONCE A WEEK.  . rosuvastatin (CRESTOR) 20 MG tablet TAKE 1 TABLET BY MOUTH EVERY DAY  . sertraline (ZOLOFT) 100 MG tablet Take 100 mg by mouth in the morning and at bedtime.  . traZODone (DESYREL) 50 MG tablet Take 50-100 mg by mouth at bedtime as needed for sleep.   . butalbital-acetaminophen-caffeine (FIORICET) 50-325-40 MG tablet TAKE 1 TABLET BY MOUTH EVERY 6 HOURS AS NEEDED FOR HEADACHE. (Patient not taking: Reported on 09/01/2020)  . metoprolol-hydrochlorothiazide (LOPRESSOR HCT) 100-25 MG tablet TAKE 1/2 TABLET BY MOUTH EVERY DAY (Patient not taking: Reported on 09/01/2020)  . ondansetron (ZOFRAN) 4 MG tablet TAKE 1 TABLET BY MOUTH EVERY 8 HOURS AS NEEDED (Patient  not taking: Reported on 09/01/2020)  . valACYclovir (VALTREX) 1000 MG tablet Take 1 tablet daily for 5 days as needed (Patient not taking: Reported on 09/01/2020)  . [DISCONTINUED] ARIPiprazole (ABILIFY) 5 MG tablet Take 5 mg by mouth daily. (Patient not taking: Reported on 09/01/2020)  . [DISCONTINUED] HYDROcodone-homatropine (HYCODAN) 5-1.5 MG/5ML syrup Take 5 mLs by mouth every 8 (eight) hours as needed for cough. (Patient not taking: Reported on 09/01/2020)  . [DISCONTINUED] hydrOXYzine (ATARAX/VISTARIL) 50 MG tablet Take 50 mg by mouth 2 (two) times  daily. (Patient not taking: Reported on 09/01/2020)   No facility-administered medications prior to visit.    Review of Systems  Constitutional: Negative for appetite change, chills, fatigue and fever.  Respiratory: Negative for chest tightness and shortness of breath.   Cardiovascular: Negative for chest pain and palpitations.  Gastrointestinal: Negative for abdominal pain, nausea and vomiting.  Neurological: Negative for dizziness and weakness.  Psychiatric/Behavioral:       Memory problems       Objective    BP (!) 190/64 (BP Location: Left Arm, Patient Position: Sitting, Cuff Size: Normal)   Pulse 64   Temp 97.7 F (36.5 C) (Temporal)   Resp 18   Wt 145 lb 3.2 oz (65.9 kg)   BMI 28.36 kg/m     Physical Exam    General: Appearance:     Overweight female in no acute distress  Eyes:    PERRL, conjunctiva/corneas clear, EOM's intact       Lungs:     Clear to auscultation bilaterally, respirations unlabored  Heart:    Normal heart rate. Normal rhythm.  3/6 harsh, crescendo-decrescendo, systolic murmur at right upper sternal border  MS:   All extremities are intact.   Neurologic:   Awake, alert, oriented x 3. No apparent focal neurological           defect.         MMSE - Mini Mental State Exam 09/01/2020  Orientation to time 5  Orientation to Place 5  Registration 3  Attention/ Calculation 5  Recall 3  Language- name 2 objects 2  Language- repeat 1  Language- follow 3 step command 3  Language- read & follow direction 1  Write a sentence 1  Copy design 1  Total score 30       Assessment & Plan     1. Memory deficit She had normal brain MRI ordered by neurology in September for work up of dizziness. She scored perfect 30 on MMSE. She does have moderate to severe anxiety which may very well be significant factor. Consider referral for additional neurocognitive testing if labs are negative.  - VITAMIN D 25 Hydroxy (Vit-D Deficiency, Fractures)  2. Anxiety  disorder, unspecified type   3. Nonrheumatic aortic valve stenosis  - ECHOCARDIOGRAM COMPLETE; Future  4. Essential hypertension Uncontrolled due to poor compliant with medications. Strongly counseled that she needs to take her blood pressure medications every day.  - TSH  5. Uncontrolled type 2 diabetes mellitus with diabetic neuropathy, without long-term current use of insulin (HCC) Poor compliance with follow up.  - Hemoglobin A1c  6. Anemia, unspecified type  - CBC  7. Hyperlipidemia, mixed  - Lipid panel - Comprehensive metabolic panel  8. Vitamin B12 deficiency  - Vitamin B12         The entirety of the information documented in the History of Present Illness, Review of Systems and Physical Exam were personally obtained by me. Portions of this information  were initially documented by the CMA and reviewed by me for thoroughness and accuracy.      Lelon Huh, MD  [facility] [phone] (phone) [phone] (fax)  [place]

## 2020-09-02 ENCOUNTER — Telehealth: Payer: Self-pay

## 2020-09-02 DIAGNOSIS — R413 Other amnesia: Secondary | ICD-10-CM

## 2020-09-02 LAB — COMPREHENSIVE METABOLIC PANEL
ALT: 15 IU/L (ref 0–32)
AST: 17 IU/L (ref 0–40)
Albumin/Globulin Ratio: 2 (ref 1.2–2.2)
Albumin: 4.2 g/dL (ref 3.8–4.8)
Alkaline Phosphatase: 111 IU/L (ref 44–121)
BUN/Creatinine Ratio: 17 (ref 12–28)
BUN: 15 mg/dL (ref 8–27)
Bilirubin Total: 0.2 mg/dL (ref 0.0–1.2)
CO2: 26 mmol/L (ref 20–29)
Calcium: 9.8 mg/dL (ref 8.7–10.3)
Chloride: 98 mmol/L (ref 96–106)
Creatinine, Ser: 0.87 mg/dL (ref 0.57–1.00)
Globulin, Total: 2.1 g/dL (ref 1.5–4.5)
Glucose: 370 mg/dL — ABNORMAL HIGH (ref 65–99)
Potassium: 4.7 mmol/L (ref 3.5–5.2)
Sodium: 140 mmol/L (ref 134–144)
Total Protein: 6.3 g/dL (ref 6.0–8.5)
eGFR: 72 mL/min/{1.73_m2} (ref >59)

## 2020-09-02 LAB — VITAMIN B12: Vitamin B-12: 1246 pg/mL — ABNORMAL HIGH (ref 232–1245)

## 2020-09-02 LAB — LIPID PANEL
Chol/HDL Ratio: 3 ratio (ref 0.0–4.4)
Cholesterol, Total: 294 mg/dL — ABNORMAL HIGH (ref 100–199)
HDL: 98 mg/dL (ref >39)
LDL Chol Calc (NIH): 161 mg/dL — ABNORMAL HIGH (ref 0–99)
Triglycerides: 196 mg/dL — ABNORMAL HIGH (ref 0–149)
VLDL Cholesterol Cal: 35 mg/dL (ref 5–40)

## 2020-09-02 LAB — CBC
Hematocrit: 35.2 % (ref 34.0–46.6)
Hemoglobin: 11.2 g/dL (ref 11.1–15.9)
MCH: 26 pg — ABNORMAL LOW (ref 26.6–33.0)
MCHC: 31.8 g/dL (ref 31.5–35.7)
MCV: 82 fL (ref 79–97)
Platelets: 212 10*3/uL (ref 150–450)
RBC: 4.31 x10E6/uL (ref 3.77–5.28)
RDW: 13.7 % (ref 11.7–15.4)
WBC: 6.7 10*3/uL (ref 3.4–10.8)

## 2020-09-02 LAB — VITAMIN D 25 HYDROXY (VIT D DEFICIENCY, FRACTURES): Vit D, 25-Hydroxy: 31.5 ng/mL (ref 30.0–100.0)

## 2020-09-02 LAB — TSH: TSH: 1.65 u[IU]/mL (ref 0.450–4.500)

## 2020-09-02 LAB — HEMOGLOBIN A1C
Est. average glucose Bld gHb Est-mCnc: 217 mg/dL
Hgb A1c MFr Bld: 9.2 % — ABNORMAL HIGH (ref 4.8–5.6)

## 2020-09-02 MED ORDER — BUTALBITAL-APAP-CAFFEINE 50-325-40 MG PO TABS
1.0000 | ORAL_TABLET | Freq: Four times a day (QID) | ORAL | 0 refills | Status: DC | PRN
Start: 2020-09-02 — End: 2020-10-13

## 2020-09-02 NOTE — Telephone Encounter (Signed)
Copied from [place] [id number]. Topic: General - Other >> Sep 02, 2020  1:45 PM Pawlus, Brayton Layman A wrote: Reason for CRM: Pt wanted to go over her lab results, and also needed to know if Dr Caryn Section wanted to do any further testing, Pt also stated she is out of work right now and will be needing a doctors note to clear her to go back to work. Pt would like to talk to Dr Caryn Section or his nurse.

## 2020-09-03 ENCOUNTER — Other Ambulatory Visit: Payer: Self-pay | Admitting: Family Medicine

## 2020-09-03 ENCOUNTER — Encounter: Payer: Self-pay | Admitting: Family Medicine

## 2020-09-03 DIAGNOSIS — R110 Nausea: Secondary | ICD-10-CM

## 2020-09-03 NOTE — Telephone Encounter (Signed)
I called and advised patient of lab results and Dr. Maralyn Sago recommendations in her chart from 09/01/2020. Patient would like to go ahead and proceed with the neurology referral for further testing. She has seen [name] in the past and would like to go back to him unless Dr. Caryn Section recommends someone else. Patient says her job will not let her come back to work until Dr. Caryn Section writes a letter stating that she is ok to return to work. Or, if Dr. Caryn Section feels that she needs to remain out of work then she needs a letter stating that he recommends she remain out of work since she is still undergoing evaluation and testing of cognitive function.

## 2020-09-03 NOTE — Telephone Encounter (Signed)
Pt calling again and is requesting to have a response today if possible. Please advise.

## 2020-09-03 NOTE — Telephone Encounter (Unsigned)
Copied from [place] [id number]. Topic: General - Other >> Sep 03, 2020 10:38 AM Tessa Lerner A wrote: Reason for CRM: Patient would like to be contacted regarding labs from 09/02/20  Patient also has concerns about additional labwork mentioned by their PCP and would like to discuss with a member of staff  Patient will be unable to return to work until cleared by Dr. Caryn Section  Please contact to further advise

## 2020-09-06 ENCOUNTER — Encounter: Payer: Self-pay | Admitting: Family Medicine

## 2020-09-06 MED ORDER — ONDANSETRON HCL 4 MG PO TABS: 4.0000 mg | ORAL_TABLET | Freq: Three times a day (TID) | ORAL | 2 refills | Status: DC | PRN

## 2020-09-06 NOTE — Addendum Note (Signed)
Addended by: [name] on: 09/06/2020 02:11 PM   Modules accepted: Orders

## 2020-09-07 ENCOUNTER — Telehealth: Payer: Self-pay

## 2020-09-07 NOTE — Telephone Encounter (Signed)
Copied from [place] [id number]. Topic: General - Other >> Sep 07, 2020  1:47 PM Alanda Slim E wrote: Reason for CRM: Pt states she was seen on 3.23.22 and let Dr. Caryn Section know she would need a letter for her employer clearing her to return to work due to having an OV and passing memory testing / pt asked if this can be emailed to the address in her mychart messages or placed in her mychart dated for when ever Dr. Caryn Section thinks she should return/ Pt would like a call to update her on the status of this letter/ please advise

## 2020-09-15 ENCOUNTER — Encounter: Payer: Self-pay | Admitting: Family Medicine

## 2020-09-15 DIAGNOSIS — F331 Major depressive disorder, recurrent, moderate: Secondary | ICD-10-CM | POA: Diagnosis not present

## 2020-09-15 DIAGNOSIS — R41840 Attention and concentration deficit: Secondary | ICD-10-CM | POA: Diagnosis not present

## 2020-09-15 DIAGNOSIS — G4700 Insomnia, unspecified: Secondary | ICD-10-CM | POA: Diagnosis not present

## 2020-09-15 DIAGNOSIS — F411 Generalized anxiety disorder: Secondary | ICD-10-CM | POA: Diagnosis not present

## 2020-09-16 ENCOUNTER — Other Ambulatory Visit: Payer: Self-pay | Admitting: Family Medicine

## 2020-09-16 DIAGNOSIS — E1165 Type 2 diabetes mellitus with hyperglycemia: Secondary | ICD-10-CM

## 2020-09-16 DIAGNOSIS — IMO0002 Reserved for concepts with insufficient information to code with codable children: Secondary | ICD-10-CM

## 2020-09-21 ENCOUNTER — Other Ambulatory Visit: Payer: Self-pay | Admitting: Family Medicine

## 2020-09-21 DIAGNOSIS — IMO0002 Reserved for concepts with insufficient information to code with codable children: Secondary | ICD-10-CM

## 2020-09-21 DIAGNOSIS — E1140 Type 2 diabetes mellitus with diabetic neuropathy, unspecified: Secondary | ICD-10-CM

## 2020-09-21 NOTE — Telephone Encounter (Signed)
Requested Prescriptions  Pending Prescriptions Disp Refills  . gabapentin (NEURONTIN) 300 MG capsule [Pharmacy Med Name: GABAPENTIN 300 MG CAPSULE] 360 capsule 0    Sig: TAKE 2 CAPSULES (600 MG TOTAL) BY MOUTH 2 (TWO) TIMES DAILY. MORNING & SUPPER     Neurology: Anticonvulsants - gabapentin Passed - 09/21/2020  5:24 PM      Passed - Valid encounter within last 12 months    Recent Outpatient Visits          2 weeks ago Memory deficit   [facility] [name], MD   4 months ago Cough   [facility] [name], MD   9 months ago Acute non-recurrent pansinusitis   [facility] Fenton Malling M, [name]   10 months ago Uncontrolled type 2 diabetes mellitus with diabetic neuropathy, without long-term current use of insulin [facility])   [facility] [name], MD   11 months ago Wound cellulitis   [facility] Fenton Malling M, PA-C             . metFORMIN (GLUCOPHAGE-XR) 500 MG 24 hr tablet [Pharmacy Med Name: METFORMIN HCL ER 500 MG TABLET] 90 tablet 0    Sig: TAKE 1 TABLET BY [place]     Endocrinology:  Diabetes - Biguanides Failed - 09/21/2020  5:24 PM      Failed - HBA1C is between 0 and 7.9 and within 180 days    Hemoglobin A1C  Date Value Ref Range Status  07/13/2016 8.5  Final   Hgb A1c MFr Bld  Date Value Ref Range Status  09/01/2020 9.2 (H) 4.8 - 5.6 % Final    Comment:             Prediabetes: 5.7 - 6.4          Diabetes: >6.4          Glycemic control for adults with diabetes: <7.0          Passed - Cr in normal range and within 360 days    Creat  Date Value Ref Range Status  05/01/2017 0.61 0.50 - 0.99 mg/dL Final    Comment:    For patients >62 years of age, the reference limit for Creatinine is approximately 13% higher for people identified as African-American. .    Creatinine, Ser  Date Value Ref Range Status  09/01/2020 0.87 0.57 - 1.00  mg/dL Final         Passed - eGFR in normal range and within 360 days    GFR, Est African American  Date Value Ref Range Status  05/01/2017 110 > OR = 60 mL/min/1.13m2 Final   GFR calc Af Amer  Date Value Ref Range Status  11/19/2019 89 >59 mL/min/1.73 Final    Comment:    **[facility] currently reports eGFR in compliance with the current**   recommendations of the [employer]. [facility] will   update reporting as new guidelines are published from the NKF-ASN   Task force.    GFR, Est Non African American  Date Value Ref Range Status  05/01/2017 95 > OR = 60 mL/min/1.34m2 Final   GFR, Estimated  Date Value Ref Range Status  05/31/2020 >60 >60 mL/min Final    Comment:    (NOTE) Calculated using the CKD-EPI Creatinine Equation (2021)          Passed - Valid encounter within last 6 months    Recent Outpatient Visits  2 weeks ago Memory deficit   [place], [place]   4 months ago Cough   [facility] [name], MD   9 months ago Acute non-recurrent pansinusitis   [place], [name]   10 months ago Uncontrolled type 2 diabetes mellitus with diabetic neuropathy, without long-term current use of insulin [facility])   [facility] [name], MD   11 months ago Wound cellulitis   [facility] Fenton Malling [place], [name]

## 2020-09-22 ENCOUNTER — Encounter: Payer: Self-pay | Admitting: Family Medicine

## 2020-09-22 ENCOUNTER — Ambulatory Visit: Payer: PPO

## 2020-09-23 ENCOUNTER — Telehealth: Payer: Self-pay

## 2020-09-23 NOTE — Telephone Encounter (Signed)
Pt given contact information

## 2020-09-23 NOTE — Telephone Encounter (Signed)
Copied from [place] [id number]. Topic: General - Other >> Sep 22, 2020  5:06 PM Mcneil, Ja-Kwan wrote: Reason for CRM: Pt missed appt for echocardiogram and would like to reschedule it but she no longer has the number. Pt stated she left a message this morning but no one ever returned her call and she misplaced the number. Pt requests call back

## 2020-09-29 ENCOUNTER — Other Ambulatory Visit: Payer: Self-pay | Admitting: Family Medicine

## 2020-09-29 ENCOUNTER — Encounter: Payer: Self-pay | Admitting: Emergency Medicine

## 2020-09-29 ENCOUNTER — Emergency Department
Admission: EM | Admit: 2020-09-29 | Discharge: 2020-09-29 | Disposition: A | Discharge status: Home / Self Care | Payer: PPO | Attending: Emergency Medicine | Admitting: Emergency Medicine

## 2020-09-29 ENCOUNTER — Other Ambulatory Visit: Payer: Self-pay

## 2020-09-29 DIAGNOSIS — Z79899 Other long term (current) drug therapy: Secondary | ICD-10-CM | POA: Insufficient documentation

## 2020-09-29 DIAGNOSIS — E860 Dehydration: Secondary | ICD-10-CM | POA: Diagnosis not present

## 2020-09-29 DIAGNOSIS — E1140 Type 2 diabetes mellitus with diabetic neuropathy, unspecified: Secondary | ICD-10-CM | POA: Diagnosis not present

## 2020-09-29 DIAGNOSIS — Z8616 Personal history of COVID-19: Secondary | ICD-10-CM | POA: Diagnosis not present

## 2020-09-29 DIAGNOSIS — Z85828 Personal history of other malignant neoplasm of skin: Secondary | ICD-10-CM | POA: Diagnosis not present

## 2020-09-29 DIAGNOSIS — I10 Essential (primary) hypertension: Secondary | ICD-10-CM | POA: Diagnosis not present

## 2020-09-29 DIAGNOSIS — J45909 Unspecified asthma, uncomplicated: Secondary | ICD-10-CM | POA: Insufficient documentation

## 2020-09-29 DIAGNOSIS — Z7984 Long term (current) use of oral hypoglycemic drugs: Secondary | ICD-10-CM | POA: Diagnosis not present

## 2020-09-29 DIAGNOSIS — R112 Nausea with vomiting, unspecified: Secondary | ICD-10-CM | POA: Insufficient documentation

## 2020-09-29 DIAGNOSIS — R110 Nausea: Secondary | ICD-10-CM

## 2020-09-29 DIAGNOSIS — R739 Hyperglycemia, unspecified: Secondary | ICD-10-CM

## 2020-09-29 DIAGNOSIS — E1165 Type 2 diabetes mellitus with hyperglycemia: Secondary | ICD-10-CM | POA: Insufficient documentation

## 2020-09-29 LAB — URINALYSIS, COMPLETE (UACMP) WITH MICROSCOPIC
Bacteria, UA: NONE SEEN
Bilirubin Urine: NEGATIVE
Glucose, UA: >=500 mg/dL — AB
Hgb urine dipstick: NEGATIVE
Ketones, ur: 80 mg/dL — AB
Leukocytes,Ua: NEGATIVE
Nitrite: NEGATIVE
Protein, ur: 100 mg/dL — AB
Specific Gravity, Urine: 1.028 (ref 1.005–1.030)
Squamous Epithelial / HPF: NONE SEEN (ref 0–5)
pH: 6 (ref 5.0–8.0)

## 2020-09-29 LAB — CBC
HCT: 39.9 % (ref 36.0–46.0)
Hemoglobin: 13.3 g/dL (ref 12.0–15.0)
MCH: 25.5 pg — ABNORMAL LOW (ref 26.0–34.0)
MCHC: 33.3 g/dL (ref 30.0–36.0)
MCV: 76.6 fL — ABNORMAL LOW (ref 80.0–100.0)
Platelets: 278 10*3/uL (ref 150–400)
RBC: 5.21 MIL/uL — ABNORMAL HIGH (ref 3.87–5.11)
RDW: 13.3 % (ref 11.5–15.5)
WBC: 10.8 10*3/uL — ABNORMAL HIGH (ref 4.0–10.5)
nRBC: 0 % (ref 0.0–0.2)

## 2020-09-29 LAB — CBG MONITORING, ED
Glucose-Capillary: 188 mg/dL — ABNORMAL HIGH (ref 70–99)
Glucose-Capillary: 149 mg/dL — ABNORMAL HIGH (ref 70–99)
Glucose-Capillary: 143 mg/dL — ABNORMAL HIGH (ref 70–99)

## 2020-09-29 LAB — BASIC METABOLIC PANEL
Anion gap: 16 — ABNORMAL HIGH (ref 5–15)
BUN: 23 mg/dL (ref 8–23)
CO2: 29 mmol/L (ref 22–32)
Calcium: 10.1 mg/dL (ref 8.9–10.3)
Chloride: 93 mmol/L — ABNORMAL LOW (ref 98–111)
Creatinine, Ser: 0.87 mg/dL (ref 0.44–1.00)
GFR, Estimated: >60 mL/min (ref >60)
Glucose, Bld: 194 mg/dL — ABNORMAL HIGH (ref 70–99)
Potassium: 3.8 mmol/L (ref 3.5–5.1)
Sodium: 138 mmol/L (ref 135–145)

## 2020-09-29 MED ORDER — KETOROLAC TROMETHAMINE 30 MG/ML IJ SOLN
15.0000 mg | Freq: Once | INTRAMUSCULAR | Status: AC
  Administered 2020-09-29: 15 mg via INTRAVENOUS
  Filled 2020-09-29: qty 1

## 2020-09-29 MED ORDER — METOCLOPRAMIDE HCL 5 MG/ML IJ SOLN
10.0000 mg | Freq: Once | INTRAMUSCULAR | Status: AC
  Administered 2020-09-29: 10 mg via INTRAVENOUS
  Filled 2020-09-29: qty 2

## 2020-09-29 MED ORDER — SODIUM CHLORIDE 0.9 % IV BOLUS
1000.0000 mL | Freq: Once | INTRAVENOUS | Status: AC
  Administered 2020-09-29: 1000 mL via INTRAVENOUS

## 2020-09-29 MED ORDER — ONDANSETRON HCL 4 MG/2ML IJ SOLN
4.0000 mg | Freq: Once | INTRAMUSCULAR | Status: AC
  Administered 2020-09-29: 4 mg via INTRAVENOUS
  Filled 2020-09-29: qty 2

## 2020-09-29 MED ORDER — ONDANSETRON 4 MG PO TBDP: 4.0000 mg | ORAL_TABLET | Freq: Three times a day (TID) | ORAL | 0 refills | Status: DC | PRN

## 2020-09-29 MED ORDER — ACETAMINOPHEN 500 MG PO TABS
1000.0000 mg | ORAL_TABLET | Freq: Once | ORAL | Status: AC
  Administered 2020-09-29: 1000 mg via ORAL
  Filled 2020-09-29: qty 2

## 2020-09-29 NOTE — ED Provider Notes (Signed)
[facility] Emergency Department Provider Note  Time seen: 2:23 PM  I have reviewed the triage vital signs and the nursing notes.   HISTORY  Chief Complaint Hyperglycemia, Hypertension, and Nausea   HPI Katie Buckley is a 70 y.o. female with a past medical history of anxiety, diabetes, gastric reflux, hypertension, presents to the emergency department for nausea vomiting and feeling unwell.  According to the patient since yesterday she has felt very nauseated and had frequent episodes of vomiting overnight.  Denies any diarrhea.  Denies any abdominal pain or chest pain.  Patient states she checked her blood sugar last night and was 409 but states she has not been taking some of her diabetic medications recently.  She took her Jardiance last night.  States she has not been checking her blood sugars recently but checked them last night because she was not feeling well.  Patient denies any dysuria fever, cough or congestion.  Checked her blood pressure last night it was elevated as well.   Past Medical History:  Diagnosis Date  . Anxiety   . Depression   . Diabetes mellitus without complication ([place])   . Frequent sinus infections   . GERD (gastroesophageal reflux disease) 04/21/2015   History of Barrett's esophagus   . Heart murmur   . Hyperchloremia   . Hypertension   . MRSA (methicillin resistant staph aureus) culture positive 2018   history of, in hand  . Neuromuscular disorder (HCC)    neuropathy  . Palpitations   . Seasonal allergies   . Skin cancer    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    Patient Active Problem List   Diagnosis Date Noted  . COVID-19 05/19/2020  . MRSA cellulitis 04/19/2018  . Decreased libido 02/04/2018  . Uncontrolled type 2 diabetes mellitus with diabetic neuropathy, without long-term current use of insulin ([place]) 04/20/2017  . H/O adenomatous polyp of colon 08/20/2015  . Rupture of implant of right breast 05/18/2015  .  Anxiety disorder 04/26/2015  . Allergic rhinitis 04/21/2015  . Anemia 04/21/2015  . Asthma 04/21/2015  . 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 Chronic infection of sinus 04/21/2015  . Depression 04/21/2015  . GERD (gastroesophageal reflux disease) 04/21/2015  . Headache 04/21/2015  . Menopausal symptoms 04/21/2015  . Obesity 04/21/2015  . Palpitations 04/21/2015  . Vitamin B12 deficiency 04/21/2015  . Aortic stenosis 01/21/2013  . Diastolic dysfunction 09/38/1829  . Diabetes mellitus out of control ([place]) 06/12/2006  . Barrett esophagus 06/13/2003  . Essential hypertension 06/12/1998  . Genital herpes 06/12/1998  . Hyperlipidemia, mixed 06/12/1998    Past Surgical History:  Procedure Laterality Date  . AUGMENTATION MAMMAPLASTY Bilateral    breast implants  . Bone Spur  2007   foot  . CARDIAC CATHETERIZATION    . Carpal Tunnel Symdrome  2008   as repeated in 2009  . CATARACT EXTRACTION W/PHACO Left 01/16/2019   Procedure: CATARACT EXTRACTION PHACO AND INTRAOCULAR LENS PLACEMENT ([place]) LEFT DIABETES VISION BLUE;  Surgeon: Marchia Meiers, MD;  Location: [facility] ORS;  Service: Ophthalmology;  Laterality: Left;  [place]  00:47 CDE 6.62 Fluid pack lot # [id number]  . CATARACT EXTRACTION W/PHACO Right 02/06/2019   Procedure: CATARACT EXTRACTION PHACO AND INTRAOCULAR LENS PLACEMENT ([facility]);  Surgeon: Marchia Meiers, MD;  Location: [facility] ORS;  Service: Ophthalmology;  Laterality: Right;  [place] 00:54.6 CDE 6.52 FLUID PACK LOT # [id number]  . CHOLECYSTECTOMY  2007  . COLONOSCOPY WITH PROPOFOL N/A 11/17/2015  Procedure: COLONOSCOPY WITH PROPOFOL;  Surgeon: Manya Silvas, MD;  Location: [facility] ENDOSCOPY;  Service: Endoscopy;  Laterality: N/A;  . ESOPHAGOGASTRODUODENOSCOPY (EGD) WITH PROPOFOL N/A 11/17/2015   Procedure: ESOPHAGOGASTRODUODENOSCOPY (EGD) WITH PROPOFOL;  Surgeon: Manya Silvas, MD;  Location: [facility] ENDOSCOPY;  Service: Endoscopy;  Laterality: N/A;  . EYE SURGERY Left    cataract extraction  . FOOT  SURGERY    . MANDIBLE SURGERY    . NASAL SINUS SURGERY  03/2013   Multile surgery Dr. Carlis Abbott. 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 Release of Trigger Finger Right 10/2010   Dr. Tamala Julian  . TONSILLECTOMY  1960    Prior to Admission medications   Medication Sig Start Date End Date Taking? Authorizing Provider  Biotin 10 MG CAPS Take 10 mg by mouth daily after lunch.    [provider]  buPROPion (WELLBUTRIN SR) 100 MG 12 hr tablet Take 1 tablet by mouth 2 (two) times daily. 05/25/20   [provider]  butalbital-acetaminophen-caffeine (FIORICET) 50-325-40 MG tablet Take 1 tablet by mouth every 6 (six) hours as needed for headache. 09/02/20   [name], MD  Cholecalciferol (VITAMIN D3 PO) Take 1,000 Units by mouth daily with lunch.     [provider]  esomeprazole (NEXIUM) 40 MG capsule Take 40 mg by mouth daily before breakfast.     [provider]  ezetimibe (ZETIA) 10 MG tablet TAKE 1 TABLET BY MOUTH EVERYDAY AT BEDTIME 07/07/20   [name], MD  fexofenadine (ALLEGRA) 180 MG tablet Take 180 mg by mouth daily as needed for allergies or rhinitis.    [provider]  fluticasone (FLONASE) 50 MCG/ACT nasal spray PLACE 2 SPRAYS INTO BOTH NOSTRILS DAILY AS NEEDED FOR ALLERGIES. 12/21/19   [name], MD  gabapentin (NEURONTIN) 300 MG capsule TAKE 2 CAPSULES (600 MG TOTAL) BY MOUTH 2 (TWO) TIMES DAILY. MORNING & SUPPER 09/21/20   [name], MD  glipiZIDE (GLUCOTROL) 10 MG tablet TAKE 1 TABLET BY MOUTH DAILY BEFORE BREAKFAST. 09/16/20   [name], MD  JARDIANCE 10 MG TABS tablet TAKE 10 MG BY MOUTH DAILY. 09/11/19   [name], MD  ketotifen (ZADITOR) 0.025 % ophthalmic solution Place 1 drop into both eyes 2 (two) times daily as needed (dry/irritated eyes.).    [provider]  Capulin     [provider]  meloxicam (MOBIC) 15 MG tablet Take 1 tablet (15 mg total) by mouth daily. Patient taking  differently: Take 15 mg by mouth daily as needed (pain.). 05/07/16   Hagler, Jami L, PA-C  metFORMIN (GLUCOPHAGE-XR) 500 MG 24 hr tablet TAKE 1 TABLET BY MOUTH EVERY DAY WITH BREAKFAST 09/21/20   [name], MD  metoprolol-hydrochlorothiazide (LOPRESSOR HCT) 100-25 MG tablet TAKE 1/2 TABLET BY MOUTH EVERY DAY Patient not taking: Reported on 09/01/2020 06/19/20   [name], MD  montelukast (SINGULAIR) 10 MG tablet TAKE 1 TABLET BY MOUTH EVERY DAY 05/18/20   [name], MD  ondansetron (ZOFRAN ODT) 4 MG disintegrating tablet Take 1 tablet (4 mg total) by mouth every 8 (eight) hours as needed for nausea or vomiting. 06/01/20   Harvest Dark, MD  ondansetron (ZOFRAN) 4 MG tablet Take 1 tablet (4 mg total) by mouth every 8 (eight) hours as needed. 09/06/20   [name], MD  OZEMPIC, 1 MG/DOSE, 4 MG/3ML SOPN INJECT 0.75 MLS (1 MG TOTAL) INTO THE SKIN ONCE A WEEK. 07/31/20   Fisher,  Kirstie Peri, MD  rosuvastatin (CRESTOR) 20 MG tablet TAKE 1 TABLET BY MOUTH EVERY DAY 06/19/20   [name], MD  sertraline (ZOLOFT) 100 MG tablet Take 100 mg by mouth in the morning and at bedtime.    [provider]  traZODone (DESYREL) 50 MG tablet Take 50-100 mg by mouth at bedtime as needed for sleep.     [provider]  valACYclovir (VALTREX) 1000 MG tablet Take 1 tablet daily for 5 days as needed Patient not taking: Reported on 09/01/2020 02/17/16   [name], MD    Allergies  Allergen Reactions  . Doxycycline Other (See Comments)    Emotional changes/anxious  . Biaxin [Clarithromycin]     GI upset, and bad taste in mouth.    Family History  Problem Relation Age of Onset  . Diabetes Father   . Lung cancer Mother   . Breast cancer Maternal Aunt   . Irritable bowel syndrome Sister   . Colon cancer Brother     Social History Social History   Tobacco Use  . Smoking status: Never Smoker  . Smokeless tobacco: Never Used  Vaping Use  . Vaping Use: Never used   Substance Use Topics  . Alcohol use: No  . Drug use: No    Review of Systems Constitutional: Negative for fever. Cardiovascular: Negative for chest pain. Respiratory: Negative for shortness of breath. Gastrointestinal: Negative for abdominal pain positive for nausea vomiting. Genitourinary: Negative for urinary compaints Musculoskeletal: Negative for musculoskeletal complaints Neurological: Negative for headache All other ROS negative  ____________________________________________   PHYSICAL EXAM:  VITAL SIGNS: ED Triage Vitals  Enc Vitals Group     BP 09/29/20 1404 (!) 127/99     Pulse Rate 09/29/20 1404 92     Resp 09/29/20 1404 18     Temp 09/29/20 1404 98.2 F (36.8 C)     Temp Source 09/29/20 1404 Oral     SpO2 09/29/20 1404 98 %     Weight 09/29/20 1405 142 lb (64.4 kg)     Height 09/29/20 1405 5' (1.524 m)     Head Circumference --      Peak Flow --      Pain Score 09/29/20 1405 7     Pain Loc --      Pain Edu? --      Excl. in [place]? --     Constitutional: Alert and oriented. Well appearing and in no distress. Eyes: Normal exam ENT      Head: Normocephalic and atraumatic.      Mouth/Throat: Mucous membranes are moist. Cardiovascular: Normal rate, regular rhythm.  Respiratory: Normal respiratory effort without tachypnea nor retractions. Breath sounds are clear  Gastrointestinal: Soft and nontender. No distention.   Musculoskeletal: Nontender with normal range of motion in all extremities. Neurologic:  Normal speech and language. No gross focal neurologic deficits Skin:  Skin is warm, dry and intact.  Psychiatric: Mood and affect are normal.   ____________________________________________    INITIAL IMPRESSION / ASSESSMENT AND PLAN / ED COURSE  Pertinent labs & imaging results that were available during my care of the patient were reviewed by me and considered in my medical decision making (see chart for details).   Patient presents emergency department  for nausea vomiting overnight.  Patient states she checked her blood sugar last night and it was greater than 400.  States she has not been checking her blood sugar recently and is not sure what it has been.  Patient used her Jardiance last night but has not been taking that over the past few months.  Patient's lab work shows anion gap of 16 and blood sugar 194.  Patient's urinalysis does show ketones consistent with dehydration but I do not believe consistent with nearly euglycemic DKA.  After 2 L of fluids and nausea medication patient is feeling much better.  Is now drinking oral fluids is drinking 2 glasses of water without issue in the emergency department.  I discussed with the patient a trial of discharge home with significant oral intake of water will prescribe nausea medication as well.  Patient is to begin checking her blood sugar at least twice daily.  I discussed with the patient if she has any further nausea vomiting or is not able to keep down fluids for any reason she is to return immediately to the emergency department for repeat evaluation and possible admission.  Patient agreeable to plan of care.  [place] was evaluated in Emergency Department on 09/29/2020 for the symptoms described in the history of present illness. She was evaluated in the context of the global COVID-19 pandemic, which necessitated consideration that the patient might be at risk for infection with the SARS-CoV-2 virus that causes COVID-19. Institutional protocols and algorithms that pertain to the evaluation of patients at risk for COVID-19 are in a state of rapid change based on information released by regulatory bodies including the CDC and federal and state organizations. These policies and algorithms were followed during the patient's care in the ED.  ____________________________________________   FINAL CLINICAL IMPRESSION(S) / ED DIAGNOSES  Dehydration Nausea vomiting Hyperglycemia   Harvest Dark, MD 09/29/20 1729

## 2020-09-29 NOTE — ED Notes (Signed)
Pt reports that her doctor prescribed her subq ozempic, and she has recently restarted taking it and thinks it may be causing side effects such as nausea.

## 2020-09-29 NOTE — Discharge Instructions (Addendum)
As discussed please drink a significant amount of nonsugar fluids over the next 2 to 3 days.  Please use your nausea medication as needed, as written.  If you are unable to keep down fluids for any reason such as decreased appetite nausea or vomiting please return to the emergency department for evaluation.  Otherwise please follow-up with your doctor in 2 days for recheck/reevaluation.  Please check your blood sugar at least twice daily to ensure values stay below 300.

## 2020-09-29 NOTE — Telephone Encounter (Signed)
Requested medications are due for refill today.  yes  Requested medications are on the active medications list.  yes  Last refill. 09/11/2019  Future visit scheduled.   yes  Notes to clinic.  Prescription is expired.

## 2020-09-29 NOTE — ED Triage Notes (Signed)
Pt comes into the ED via POV c/o Hyperglycemia, HTN, and nausea.  Pt states that this started yesterday.  Pt explains her CBG at home was over 400.  Pt states her BP was 139/78.  Pt ambulatory to exam room.  Pt admits to having dry mouth, increased thirst, increased urination, nausea, and dizziness.  Pt has even and unlabored respirations at this time.

## 2020-10-05 DIAGNOSIS — F411 Generalized anxiety disorder: Secondary | ICD-10-CM | POA: Diagnosis not present

## 2020-10-05 DIAGNOSIS — J301 Allergic rhinitis due to pollen: Secondary | ICD-10-CM | POA: Diagnosis not present

## 2020-10-05 DIAGNOSIS — J324 Chronic pansinusitis: Secondary | ICD-10-CM | POA: Diagnosis not present

## 2020-10-05 DIAGNOSIS — G4700 Insomnia, unspecified: Secondary | ICD-10-CM | POA: Diagnosis not present

## 2020-10-05 DIAGNOSIS — J340 Abscess, furuncle and carbuncle of nose: Secondary | ICD-10-CM | POA: Diagnosis not present

## 2020-10-05 DIAGNOSIS — F331 Major depressive disorder, recurrent, moderate: Secondary | ICD-10-CM | POA: Diagnosis not present

## 2020-10-05 DIAGNOSIS — R41840 Attention and concentration deficit: Secondary | ICD-10-CM | POA: Diagnosis not present

## 2020-10-11 ENCOUNTER — Telehealth: Payer: Self-pay | Admitting: Family Medicine

## 2020-10-11 DIAGNOSIS — I350 Nonrheumatic aortic (valve) stenosis: Secondary | ICD-10-CM

## 2020-10-11 NOTE — Telephone Encounter (Signed)
Katie Buckley from [facility] called requesting new order for an echo. The existing one has expired because pt rescheduled her appointment.Her call back number is [phone] if needed. Pt appointment is tomorrow 10/12/20,Thanks

## 2020-10-12 ENCOUNTER — Other Ambulatory Visit: Payer: Self-pay

## 2020-10-12 ENCOUNTER — Other Ambulatory Visit: Payer: Self-pay | Admitting: Family Medicine

## 2020-10-12 ENCOUNTER — Ambulatory Visit
Admission: RE | Admit: 2020-10-12 | Discharge: 2020-10-12 | Disposition: A | Discharge status: Home / Self Care | Payer: PPO | Source: Ambulatory Visit | Attending: Family Medicine | Admitting: Family Medicine

## 2020-10-12 DIAGNOSIS — R011 Cardiac murmur, unspecified: Secondary | ICD-10-CM | POA: Diagnosis not present

## 2020-10-12 DIAGNOSIS — I350 Nonrheumatic aortic (valve) stenosis: Secondary | ICD-10-CM | POA: Diagnosis not present

## 2020-10-12 DIAGNOSIS — I10 Essential (primary) hypertension: Secondary | ICD-10-CM | POA: Insufficient documentation

## 2020-10-12 DIAGNOSIS — E118 Type 2 diabetes mellitus with unspecified complications: Secondary | ICD-10-CM | POA: Insufficient documentation

## 2020-10-12 LAB — ECHOCARDIOGRAM COMPLETE
AR max vel: 0.96 cm2
AV Area VTI: 0.91 cm2
AV Area mean vel: 0.88 cm2
AV Mean grad: 18.7 mmHg
AV Peak grad: 32.9 mmHg
Ao pk vel: 2.87 m/s
Area-P 1/2: 3.13 cm2
MV VTI: 1.87 cm2
P 1/2 time: 495 msec
S' Lateral: 1.87 cm

## 2020-10-12 NOTE — Telephone Encounter (Signed)
Requested medications are due for refill today.  yes  Requested medications are on the active medications list.  yes  Last refill. 09/02/2020  Future visit scheduled.   yes  Notes to clinic.  Medication not delegated. 

## 2020-10-12 NOTE — Progress Notes (Signed)
*  PRELIMINARY RESULTS* Echocardiogram 2D Echocardiogram has been performed.  Katie Buckley 10/12/2020, 12:30 PM

## 2020-10-14 ENCOUNTER — Other Ambulatory Visit: Payer: Self-pay | Admitting: Family Medicine

## 2020-10-14 DIAGNOSIS — I350 Nonrheumatic aortic (valve) stenosis: Secondary | ICD-10-CM

## 2020-10-14 DIAGNOSIS — I517 Cardiomegaly: Secondary | ICD-10-CM

## 2020-10-14 MED ORDER — METOPROLOL-HYDROCHLOROTHIAZIDE 100-25 MG PO TABS: 0.5000 | ORAL_TABLET | Freq: Every day | ORAL | 1 refills | Status: DC

## 2020-10-15 ENCOUNTER — Encounter: Payer: Self-pay | Admitting: Family Medicine

## 2020-10-19 MED ORDER — IRBESARTAN 75 MG PO TABS: 75.0000 mg | ORAL_TABLET | Freq: Every day | ORAL | 2 refills | Status: DC

## 2020-10-20 DIAGNOSIS — G20 Parkinson's disease: Secondary | ICD-10-CM | POA: Diagnosis not present

## 2020-10-20 DIAGNOSIS — G629 Polyneuropathy, unspecified: Secondary | ICD-10-CM | POA: Diagnosis not present

## 2020-10-20 DIAGNOSIS — E1142 Type 2 diabetes mellitus with diabetic polyneuropathy: Secondary | ICD-10-CM | POA: Diagnosis not present

## 2020-10-20 DIAGNOSIS — R2689 Other abnormalities of gait and mobility: Secondary | ICD-10-CM | POA: Diagnosis not present

## 2020-10-20 DIAGNOSIS — M4802 Spinal stenosis, cervical region: Secondary | ICD-10-CM | POA: Diagnosis not present

## 2020-10-21 DIAGNOSIS — J301 Allergic rhinitis due to pollen: Secondary | ICD-10-CM | POA: Diagnosis not present

## 2020-10-21 DIAGNOSIS — J324 Chronic pansinusitis: Secondary | ICD-10-CM | POA: Diagnosis not present

## 2020-10-25 ENCOUNTER — Other Ambulatory Visit: Payer: Self-pay | Admitting: Neurosurgery

## 2020-10-26 NOTE — Progress Notes (Signed)
Complete Physical Exam    Patient: Katie Buckley, Female    DOB: 09-12-1950, 70 y.o.   MRN: [id number] Visit Date: 10/27/2020  Today's Provider: Lelon Huh, MD   No chief complaint on file.  Subjective    [place] is a 70 y.o. female who presents today for her Complete Physical exam She reports consuming a high sugar diet. Patient is walking some for exercise. She generally feels fairly well. She reports sleeping well. She does have additional problems to discuss today.   Patient wants to get covid booster.  11/17/2015: Colonoscopy  06/25/2019: Mammogram - BI-RADS CATEGORY  2: Benign   HPI Hypertension, follow-up  BP Readings from Last 3 Encounters:  09/29/20 (!) 151/66  09/01/20 (!) 190/64  06/01/20 (!) 142/85   Wt Readings from Last 3 Encounters:  09/29/20 142 lb (64.4 kg)  09/01/20 145 lb 3.2 oz (65.9 kg)  05/31/20 140 lb (63.5 kg)     She was last seen for hypertension 2 months ago.  BP at that visit was 190/64Important. Management since that visit includes none; strongly encouraged to take BP medications.  She reports excellent compliance with treatment. She is not having side effects.  She is following a Regular diet. She is exercising. She does not smoke.  Prescription for irbesartan was sent to her pharmacy about a week ago, but she states the only recent prescription she has picked up was metoprolol, which dispensed shows was dispense the week before.  Use of agents associated with hypertension: none.   Outside blood pressures are 160s/70s. Symptoms: No chest pain No chest pressure  Yes palpitations No syncope  No dyspnea No orthopnea  No paroxysmal nocturnal dyspnea No lower extremity edema   Pertinent labs: Lab Results  Component Value Date   CHOL 294 (H) 09/01/2020   HDL 98 09/01/2020   LDLCALC 161 (H) 09/01/2020   TRIG 196 (H) 09/01/2020   CHOLHDL 3.0 09/01/2020   Lab Results  Component Value Date   NA 138 09/29/2020   K  3.8 09/29/2020   CREATININE 0.87 09/29/2020   GFRNONAA >60 09/29/2020   GFRAA 89 11/19/2019   GLUCOSE 194 (H) 09/29/2020     The 10-year ASCVD risk score Mikey Bussing DC Jr., et al., 2013) is: 30.5%   --------------------------------------------------------------------------------------------------- Lipid/Cholesterol, Follow-up  Last lipid panel Other pertinent labs  Lab Results  Component Value Date   CHOL 294 (H) 09/01/2020   HDL 98 09/01/2020   LDLCALC 161 (H) 09/01/2020   TRIG 196 (H) 09/01/2020   CHOLHDL 3.0 09/01/2020   Lab Results  Component Value Date   ALT 15 09/01/2020   AST 17 09/01/2020   PLT 278 09/29/2020   TSH 1.650 09/01/2020     She was last seen for this 2 months ago.  Management since that visit includes none.  She reports fair compliance with treatment. She is not having side effects.   Symptoms: No chest pain No chest pressure/discomfort  No dyspnea No lower extremity edema  No numbness or tingling of extremity No orthopnea  Yes palpitations No paroxysmal nocturnal dyspnea  No speech difficulty No syncope   Current diet: Patient says her diet is sugary Current exercise: walking  The 10-year ASCVD risk score Mikey Bussing DC Jr., et al., 2013) is: 30.5%  --------------------------------------------------------------------------------------------------- Diabetes Mellitus Type II, Follow-up  Lab Results  Component Value Date   HGBA1C 9.2 (H) 09/01/2020   HGBA1C 9.6 (A) 11/19/2019   HGBA1C 11.8 (H) 06/17/2019  Wt Readings from Last 3 Encounters:  09/29/20 142 lb (64.4 kg)  09/01/20 145 lb 3.2 oz (65.9 kg)  05/31/20 140 lb (63.5 kg)   Last seen for diabetes 2 months ago.  Management since then includes none. She reports fair compliance with treatment. She states she stopped Ozempic several weeks ago due to persistent nausea and vomiting. She was on Trulicity last year and tolerating the 0.75mg  dose, but not the 1.5mg  dose due to nausea.  She is not  having side effects.  Symptoms: No fatigue No foot ulcerations  Yes appetite changes No nausea  Yes paresthesia of the feet  No polydipsia  No polyuria No visual disturbances   No vomiting     Home blood sugar records: fasting range: 160s   Episodes of hypoglycemia? 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 Biotin 10 MG CAPS Take 10 mg by mouth daily after lunch.  Marland Kitchen buPROPion (WELLBUTRIN SR) 100 MG 12 hr tablet Take 1 tablet by mouth 2 (two) times daily.  . butalbital-acetaminophen-caffeine (FIORICET) 50-325-40 MG tablet TAKE 1 TABLET BY MOUTH EVERY 6 HOURS AS NEEDED FOR HEADACHE.  Marland Kitchen Cholecalciferol (VITAMIN D3 PO) Take 1,000 Units by mouth daily with lunch.   . esomeprazole (NEXIUM) 40 MG capsule Take 40 mg by mouth daily before breakfast.   . ezetimibe (ZETIA) 10 MG tablet TAKE 1 TABLET BY MOUTH EVERYDAY AT BEDTIME  . fexofenadine (ALLEGRA) 180 MG tablet Take 180 mg by mouth daily as needed for allergies or rhinitis.  . fluticasone (FLONASE) 50 MCG/ACT nasal spray PLACE 2 SPRAYS INTO BOTH NOSTRILS DAILY AS NEEDED FOR ALLERGIES.  Marland Kitchen gabapentin (NEURONTIN) 300 MG capsule TAKE 2 CAPSULES (600 MG TOTAL) BY MOUTH 2 (TWO) TIMES DAILY. MORNING & SUPPER  . glipiZIDE (GLUCOTROL) 10 MG tablet TAKE 1 TABLET BY MOUTH DAILY BEFORE BREAKFAST.  Marland Kitchen irbesartan  (AVAPRO) 75 MG tablet Take 1 tablet (75 mg total) by mouth daily.(NOT TAKING)  . JARDIANCE 10 MG TABS tablet TAKE 1 TABLET BY MOUTH EVERY DAY  . ketotifen (ZADITOR) 0.025 % ophthalmic solution Place 1 drop into both eyes 2 (two) times daily as needed (dry/irritated eyes.).  Marland Kitchen Lancet Devices MISC   . meloxicam (MOBIC) 15 MG tablet Take 1 tablet (15 mg total) by mouth daily. (Patient taking differently: Take 15 mg by mouth daily as needed (pain.).)  . metFORMIN (GLUCOPHAGE-XR) 500 MG 24 hr tablet TAKE 1 TABLET BY MOUTH EVERY DAY WITH BREAKFAST  . metoprolol-hydrochlorothiazide (LOPRESSOR HCT) 100-25 MG tablet Take 0.5 tablets by mouth daily.  . montelukast (SINGULAIR) 10 MG tablet TAKE 1 TABLET BY MOUTH EVERY DAY  . ondansetron (ZOFRAN ODT) 4 MG disintegrating tablet Take 1 tablet (4 mg total) by mouth every 8 (eight) hours as needed for nausea or vomiting.  . ondansetron (ZOFRAN) 4 MG tablet Take 1 tablet (4 mg total) by mouth  every 8 (eight) hours as needed.  Marland Kitchen OZEMPIC, 1 MG/DOSE, 4 MG/3ML SOPN INJECT 0.75 MLS (1 MG TOTAL) INTO THE SKIN ONCE A WEEK. (NOT TAKING)  . rosuvastatin (CRESTOR) 20 MG tablet TAKE 1 TABLET BY MOUTH EVERY DAY  . sertraline (ZOLOFT) 100 MG tablet Take 100 mg by mouth in the morning and at bedtime.  . traZODone (DESYREL) 50 MG tablet Take 50-100 mg by mouth at bedtime as needed for sleep.   . valACYclovir (VALTREX) 1000 MG tablet Take 1 tablet daily for 5 days as needed (Patient not taking: Reported on 09/01/2020)   No facility-administered medications prior to visit.    Allergies  Allergen Reactions  . Doxycycline Other (See Comments)    Emotional changes/anxious  . Biaxin [Clarithromycin]     GI upset, and bad taste in mouth.    Patient Care Team: [name], MD as PCP - General (Family Medicine) Chauncey Mann, MD as Referring Physician (Psychiatry) Verdia Kuba, [facility] (Inactive) (Pharmacist) Vladimir Crofts, MD as Consulting Physician (Neurology)  Review  of Systems  Constitutional: Positive for appetite change and unexpected weight change.       Irritability, Crying  HENT: Positive for nosebleeds, postnasal drip, rhinorrhea and sneezing.   Eyes: Positive for itching.  Musculoskeletal: Positive for neck pain.  Psychiatric/Behavioral: Positive for agitation. The patient is nervous/anxious.        Objective    Vitals: BP (!) 181/56 (BP Location: Right Arm, Patient Position: Sitting, Cuff Size: Normal)   Pulse 62   Ht 5' (1.524 m)   Wt 143 lb 6.4 oz (65 kg)   SpO2 97%   BMI 28.01 kg/m   Physical Exam   General Appearance:     Overweight female. Alert, cooperative, in no acute distress, appears stated age   Head:    Normocephalic, without obvious abnormality, atraumatic  Eyes:    PERRL, conjunctiva/corneas clear, EOM's intact, fundi    benign, both eyes  Ears:    Normal TM's and external ear canals, both ears  Nose:   Nares normal, septum midline, mucosa normal, no drainage    or sinus tenderness  Throat:   Lips, mucosa, and tongue normal; teeth and gums normal  Neck:   Supple, symmetrical, trachea midline, no adenopathy;    thyroid:  no enlargement/tenderness/nodules;  Bilateral carotid bruits, R>L  Back:     Symmetric, no curvature, ROM normal, no CVA tenderness  Lungs:     Clear to auscultation bilaterally, respirations unlabored  Chest Wall:    No tenderness or deformity   Heart:    Normal heart rate. Normal rhythm.  3/6 harsh, crescendo-decrescendo, systolic murmur at right upper sternal border  Breast Exam:    deferred  Abdomen:     Soft, non-tender, bowel sounds active all four quadrants,    no masses, no organomegaly  Pelvic:    deferred  Extremities:   All extremities are intact. No cyanosis or edema  Pulses:   2+ and symmetric all extremities  Skin:   Skin color, texture, turgor normal, no rashes or lesions  Lymph nodes:   Cervical, supraclavicular, and axillary nodes normal  Neurologic:   CNII-XII intact, normal  strength, sensation and reflexes    throughout     Results for orders placed or performed in visit on 10/27/20  POCT glycosylated hemoglobin (Hb A1C)  Result Value Ref Range   Hemoglobin A1C 9.9 (A) 4.0 - 5.6 %     Assessment & Plan  1. Annual physical exam   2. Uncontrolled type 2 diabetes mellitus with diabetic neuropathy, without long-term current use of insulin (HCC) Intolerant to Ozempic and Trulicity. Will increase from 10mg  to empagliflozin (JARDIANCE) 25 MG TABS tablet; Take 1 tablet (25 mg total) by mouth daily and increase from 500mg  daily to metFORMIN (GLUCOPHAGE-XR) 500 MG 24 hr tablet; Take 2 tablets (1,000 mg total) by mouth daily.    3. Bruit of left carotid artery  - US Carotid Duplex Bilateral; Future  4. LVH (left ventricular hypertrophy)  5. Diastolic dysfunction Counseled on the importance of stringent BP control. Have sent prescription for irbesartan to add to her previous medications, but she states she never received prescription from her pharmacy. She is to double check her home meds and contact the pharmacy if she doesn't have it.   6. Essential hypertension As above.   7. Hyperlipidemia, mixed She is tolerating rosuvastatin well with no adverse effects.    Future Appointments  Date Time Provider [place]  01/31/2021 11:00 AM Caryn Section, Kirstie Peri, MD [facility]       The entirety of the information documented in the History of Present Illness, Review of Systems and Physical Exam were personally obtained by me. Portions of this information were initially documented by the CMA and reviewed by me for thoroughness and accuracy.      Lelon Huh, MD  [facility] [phone] (phone) [phone] (fax)  [place]

## 2020-10-27 ENCOUNTER — Ambulatory Visit (INDEPENDENT_AMBULATORY_CARE_PROVIDER_SITE_OTHER): Payer: PPO | Admitting: Family Medicine

## 2020-10-27 ENCOUNTER — Other Ambulatory Visit: Payer: Self-pay

## 2020-10-27 VITALS — BP 181/56 | HR 62 | Ht 60.0 in | Wt 143.4 lb

## 2020-10-27 DIAGNOSIS — IMO0002 Reserved for concepts with insufficient information to code with codable children: Secondary | ICD-10-CM

## 2020-10-27 DIAGNOSIS — E1140 Type 2 diabetes mellitus with diabetic neuropathy, unspecified: Secondary | ICD-10-CM

## 2020-10-27 DIAGNOSIS — I5189 Other ill-defined heart diseases: Secondary | ICD-10-CM | POA: Diagnosis not present

## 2020-10-27 DIAGNOSIS — Z0000 Encounter for general adult medical examination without abnormal findings: Secondary | ICD-10-CM | POA: Diagnosis not present

## 2020-10-27 DIAGNOSIS — I517 Cardiomegaly: Secondary | ICD-10-CM | POA: Diagnosis not present

## 2020-10-27 DIAGNOSIS — E1165 Type 2 diabetes mellitus with hyperglycemia: Secondary | ICD-10-CM

## 2020-10-27 DIAGNOSIS — I10 Essential (primary) hypertension: Secondary | ICD-10-CM

## 2020-10-27 DIAGNOSIS — E782 Mixed hyperlipidemia: Secondary | ICD-10-CM

## 2020-10-27 DIAGNOSIS — R0989 Other specified symptoms and signs involving the circulatory and respiratory systems: Secondary | ICD-10-CM | POA: Diagnosis not present

## 2020-10-27 LAB — POCT GLYCOSYLATED HEMOGLOBIN (HGB A1C): Hemoglobin A1C: 9.9 % — AB (ref 4.0–5.6)

## 2020-10-27 MED ORDER — EMPAGLIFLOZIN 25 MG PO TABS: 25.0000 mg | ORAL_TABLET | Freq: Every day | ORAL | 1 refills | Status: DC

## 2020-10-27 MED ORDER — METFORMIN HCL ER 500 MG PO TB24: 1000.0000 mg | ORAL_TABLET | Freq: Every day | ORAL | 3 refills | Status: DC

## 2020-10-27 NOTE — Progress Notes (Signed)
Annual Wellness Visit     Patient: Katie Buckley, Female    DOB: 01/22/1951, 70 y.o.   MRN: [id number] Visit Date: 10/27/2020  Today's Provider: Lelon Huh, MD   Chief Complaint  Patient presents with  . Annual Exam  . Hypertension  . Hyperlipidemia  . Diabetes  . Anxiety   Subjective    Katie Buckley is a 70 y.o. female who presents today for her Annual Wellness Visit.     Medications: Outpatient Medications Prior to Visit  Medication Sig Note  . Biotin 10 MG CAPS Take 10 mg by mouth daily after lunch.   Marland Kitchen buPROPion (WELLBUTRIN SR) 100 MG 12 hr tablet Take 1 tablet by mouth 2 (two) times daily.   . butalbital-acetaminophen-caffeine (FIORICET) 50-325-40 MG tablet TAKE 1 TABLET BY MOUTH EVERY 6 HOURS AS NEEDED FOR HEADACHE.   Marland Kitchen Cholecalciferol (VITAMIN D3 PO) Take 1,000 Units by mouth daily with lunch.    . esomeprazole (NEXIUM) 40 MG capsule Take 40 mg by mouth daily before breakfast.    . ezetimibe (ZETIA) 10 MG tablet TAKE 1 TABLET BY MOUTH EVERYDAY AT BEDTIME   . fexofenadine (ALLEGRA) 180 MG tablet Take 180 mg by mouth daily as needed for allergies or rhinitis.   . fluticasone (FLONASE) 50 MCG/ACT nasal spray PLACE 2 SPRAYS INTO BOTH NOSTRILS DAILY AS NEEDED FOR ALLERGIES.   Marland Kitchen gabapentin (NEURONTIN) 300 MG capsule TAKE 2 CAPSULES (600 MG TOTAL) BY MOUTH 2 (TWO) TIMES DAILY. MORNING & SUPPER   . glipiZIDE (GLUCOTROL) 10 MG tablet TAKE 1 TABLET BY MOUTH DAILY BEFORE BREAKFAST.   Marland Kitchen irbesartan (AVAPRO) 75 MG tablet Take 1 tablet (75 mg total) by mouth daily. (NOT TAKING)   . ketotifen (ZADITOR) 0.025 % ophthalmic solution Place 1 drop into both eyes 2 (two) times daily as needed (dry/irritated eyes.).   Marland Kitchen Lancet Devices MISC    . meloxicam (MOBIC) 15 MG tablet Take 1 tablet (15 mg total) by mouth daily. (Patient taking differently: Take 15 mg by mouth daily as needed (pain.).)   . metoprolol-hydrochlorothiazide (LOPRESSOR HCT) 100-25 MG tablet Take 0.5 tablets  by mouth daily.   . montelukast (SINGULAIR) 10 MG tablet TAKE 1 TABLET BY MOUTH EVERY DAY   . ondansetron (ZOFRAN ODT) 4 MG disintegrating tablet Take 1 tablet (4 mg total) by mouth every 8 (eight) hours as needed for nausea or vomiting.   . ondansetron (ZOFRAN) 4 MG tablet Take 1 tablet (4 mg total) by mouth every 8 (eight) hours as needed.   . rosuvastatin (CRESTOR) 20 MG tablet TAKE 1 TABLET BY MOUTH EVERY DAY   . sertraline (ZOLOFT) 100 MG tablet Take 100 mg by mouth in the morning and at bedtime.   . traZODone (DESYREL) 50 MG tablet Take 50-100 mg by mouth at bedtime as needed for sleep.    . valACYclovir (VALTREX) 1000 MG tablet Take 1 tablet daily for 5 days as needed   . JARDIANCE 10 MG TABS tablet TAKE 1 TABLET BY MOUTH EVERY DAY   . metFORMIN (GLUCOPHAGE-XR) 500 MG 24 hr tablet TAKE 1 TABLET BY MOUTH EVERY DAY WITH BREAKFAST   . [DISCONTINUED] OZEMPIC, 1 MG/DOSE, 4 MG/3ML SOPN INJECT 0.75 MLS (1 MG TOTAL) INTO THE SKIN ONCE A WEEK. 10/27/2020: nausea and vomiting   No facility-administered medications prior to visit.    Allergies  Allergen Reactions  . Doxycycline Other (See Comments)    Emotional changes/anxious  . Biaxin [Clarithromycin]  GI upset, and bad taste in mouth.    Patient Care Team: [name], MD as PCP - General (Family Medicine) Chauncey Mann, MD as Referring Physician (Psychiatry) Verdia Kuba, [facility] (Inactive) (Pharmacist) Vladimir Crofts, MD as Consulting Physician (Neurology) Deetta Perla, MD as Consulting Physician (Neurosurgery)      Objective     Most recent functional status assessment: In your present state of health, do you have any difficulty performing the following activities: 10/27/2020  Hearing? Y  Vision? N  Difficulty concentrating or making decisions? Y  Walking or climbing stairs? Y  Dressing or bathing? N  Doing errands, shopping? N  Some recent data might be hidden   Most recent fall risk assessment: Fall Risk   10/27/2020  Falls in the past year? 1  Comment -  Number falls in past yr: 1  Comment -  Injury with Fall? -  Follow up -    Most recent depression screenings: PHQ 2/9 Scores 10/27/2020 09/01/2020  PHQ - 2 Score 2 2  PHQ- 9 Score 7 9   Most recent cognitive screening: No flowsheet data found. Most recent Audit-C alcohol use screening Alcohol Use Disorder Test (AUDIT) 10/27/2020  1. How often do you have a drink containing alcohol? 0  2. How many drinks containing alcohol do you have on a typical day when you are drinking? 0  3. How often do you have six or more drinks on one occasion? 0  AUDIT-C Score 0  Alcohol Brief Interventions/Follow-up -   A score of 3 or more in women, and 4 or more in men indicates increased risk for alcohol abuse, EXCEPT if all of the points are from question 1   No results found for any visits on 10/27/20.  Assessment & Plan     Annual wellness visit done today including the all of the following: Reviewed patient's Family Medical History Reviewed and updated list of patient's medical providers Assessment of cognitive impairment was done Assessed patient's functional ability Established a written schedule for health screening [place] Completed and Reviewed  Exercise Activities and Dietary recommendations Goals    . Chronic Care Management     CARE PLAN ENTRY (see longitudinal plan of care for additional care plan information)  Current Barriers:  . Chronic Disease Management support, education, and care coordination needs related to Hypertension, Hyperlipidemia, and Diabetes   Hypertension BP Readings from Last 3 Encounters:  11/19/19 118/62  10/10/19 (!) 157/77  06/17/19 (!) 154/62   . Pharmacist Clinical Goal(s): o Over the next 90 days, patient will work with PharmD and providers to maintain BP goal <130/80 . Current regimen:  o Metoprolol-hydrochlorothiazide 100-25 mg 1/2 tab  daily . Interventions: o None . Patient self care activities - Over the next 90 days, patient will: o Check BP daily, document, and provide at future appointments o Ensure daily salt intake < 2300 mg/day  Hyperlipidemia Lab Results  Component Value Date/Time   LDLCALC 104 (H) 11/19/2019 09:58 AM   LDLCALC 104 (H) 05/01/2017 08:42 AM   LDLCALC 125 (H) 03/28/2013 04:19 AM   . Pharmacist Clinical Goal(s): o Over the next 90 days, patient will work with PharmD and providers to achieve LDL goal < 70 . Current regimen:  o Zetia 10mg  daily o Crestor 20mg  daily . Interventions: o Recommend increase rosuvastatin 40mg  daily . Patient self care activities - Over the next 90 days, patient will: o Work with provider/PharmD to increase Crestor to  maximum dose  Diabetes Lab Results  Component Value Date/Time   HGBA1C 9.6 (A) 11/19/2019 10:15 AM   HGBA1C 11.8 (H) 06/17/2019 03:44 PM   HGBA1C 11.2 (A) 11/13/2017 04:45 PM   HGBA1C 8.5 07/13/2016 12:00 AM   HGBA1C 8.1 (A) 07/22/2014 12:00 AM   HGBA1C 9.2 (H) 03/27/2013 06:21 PM   . Pharmacist Clinical Goal(s): o Over the next 90 days, patient will work with PharmD and providers to achieve A1c goal <7% . Current regimen:  o Metformin 500mg  daily o Ozempic 0.75mg  inject weekly o Jardiance 10mg  daily . Interventions: o Recommend increase metformin 500mg  twice daily o Recommend increase Ozempic 1.5mg  inj weekly . Patient self care activities - Over the next 90 days, patient will: o Check blood sugar once daily, document, and provide at future appointments o Contact provider with any episodes of hypoglycemia  Medication management . Pharmacist Clinical Goal(s): o Over the next 90 days, patient will work with PharmD and providers to maintain optimal medication adherence . Current pharmacy: [facility] . Interventions o Comprehensive medication review performed. o Continue current medication management strategy . Patient self care activities -  Over the next 90 days, patient will: o Focus on medication adherence by decreasing anticholinergic burden by reducing use of medications such as hydroxyzine and Fioricet whenever possible o Take medications as prescribed o Report any questions or concerns to PharmD and/or provider(s)  Initial goal documentation        Immunization History  Administered Date(s) Administered  . Hep A / Hep B 02/15/2011, 03/29/2011, 09/06/2011  . Influenza, High Dose Seasonal PF 04/18/2017  . Influenza,inj,Quad PF,6+ Mos 04/23/2015, 02/17/2016  . Influenza-Unspecified 03/25/2019  . PFIZER(Purple Top)SARS-COV-2 Vaccination 08/03/2019, 08/27/2019  . Pneumococcal Conjugate-13 04/18/2017  . Pneumococcal Polysaccharide-23 11/27/2011, 03/29/2018  . Tdap 10/09/2007  . Zoster 11/27/2011    Health Maintenance  Topic Date Due  . FOOT EXAM  Never done  . DEXA SCAN  Never done  . TETANUS/TDAP  10/08/2017  . COVID-19 Vaccine (3 - Booster for Pfizer series) 01/27/2020  . COLONOSCOPY (Pts 45-60yrs Insurance coverage will need to be confirmed)  11/16/2020  . INFLUENZA VACCINE  01/10/2021  . HEMOGLOBIN A1C  03/04/2021  . MAMMOGRAM  06/24/2021  . OPHTHALMOLOGY EXAM  06/30/2021  . Hepatitis C Screening  Completed  . PNA vac Low Risk Adult  Completed  . HPV VACCINES  Aged Out     Discussed health benefits of physical activity, and encouraged her to engage in regular exercise appropriate for her age and condition.        The entirety of the information documented in the History of Present Illness, Review of Systems and Physical Exam were personally obtained by me. Portions of this information were initially documented by the CMA and reviewed by me for thoroughness and accuracy.      Lelon Huh, MD  [facility] [phone] (phone) [phone] (fax)  [place]

## 2020-10-27 NOTE — Patient Instructions (Signed)
.   I sent a prescription for irbesartan 75mg  to [facility] on May 10th 2022. If you do not have this medication then please stop by the pharmacy to pick it up. Let me know if they need to resend the prescription.

## 2020-10-29 ENCOUNTER — Telehealth: Payer: Self-pay | Admitting: Family Medicine

## 2020-10-29 DIAGNOSIS — F332 Major depressive disorder, recurrent severe without psychotic features: Secondary | ICD-10-CM | POA: Diagnosis not present

## 2020-10-29 NOTE — Telephone Encounter (Signed)
Please review

## 2020-10-29 NOTE — Telephone Encounter (Signed)
This patient was here on Wednesday and didn't bring her medications with her. She was supposed to be taking irbesartan but she told me she wasn't taking it, but wasn't sure. She was supposed to go home and check her medications and with her pharmacist about it. Can you check with her and see if she ever got the irbesartan?

## 2020-10-29 NOTE — Telephone Encounter (Signed)
Patient called back for Katie Buckley say that she did get the [facility] and is taking the medication now

## 2020-10-29 NOTE — Telephone Encounter (Signed)
Left message to call back. Ok for [facility] to ask below if patient calls back.

## 2020-11-01 ENCOUNTER — Ambulatory Visit
Admission: RE | Admit: 2020-11-01 | Discharge: 2020-11-01 | Disposition: A | Discharge status: Home / Self Care | Payer: PPO | Source: Ambulatory Visit | Attending: Family Medicine | Admitting: Family Medicine

## 2020-11-01 ENCOUNTER — Other Ambulatory Visit: Payer: Self-pay

## 2020-11-01 DIAGNOSIS — F332 Major depressive disorder, recurrent severe without psychotic features: Secondary | ICD-10-CM | POA: Diagnosis not present

## 2020-11-01 DIAGNOSIS — R0989 Other specified symptoms and signs involving the circulatory and respiratory systems: Secondary | ICD-10-CM | POA: Insufficient documentation

## 2020-11-01 DIAGNOSIS — I6523 Occlusion and stenosis of bilateral carotid arteries: Secondary | ICD-10-CM | POA: Diagnosis not present

## 2020-11-02 DIAGNOSIS — F332 Major depressive disorder, recurrent severe without psychotic features: Secondary | ICD-10-CM | POA: Diagnosis not present

## 2020-11-03 ENCOUNTER — Encounter
Admission: RE | Admit: 2020-11-03 | Discharge: 2020-11-03 | Disposition: A | Discharge status: Home / Self Care | Payer: PPO | Source: Ambulatory Visit | Attending: Neurosurgery | Admitting: Neurosurgery

## 2020-11-03 ENCOUNTER — Other Ambulatory Visit: Payer: Self-pay

## 2020-11-03 ENCOUNTER — Encounter: Payer: Self-pay | Admitting: Neurosurgery

## 2020-11-03 DIAGNOSIS — Z01818 Encounter for other preprocedural examination: Secondary | ICD-10-CM | POA: Diagnosis not present

## 2020-11-03 DIAGNOSIS — I10 Essential (primary) hypertension: Secondary | ICD-10-CM | POA: Insufficient documentation

## 2020-11-03 DIAGNOSIS — F332 Major depressive disorder, recurrent severe without psychotic features: Secondary | ICD-10-CM | POA: Diagnosis not present

## 2020-11-03 HISTORY — DX: Headache, unspecified: R51.9

## 2020-11-03 LAB — URINALYSIS, ROUTINE W REFLEX MICROSCOPIC
Bacteria, UA: NONE SEEN
Bilirubin Urine: NEGATIVE
Glucose, UA: >=500 mg/dL — AB
Hgb urine dipstick: NEGATIVE
Ketones, ur: NEGATIVE mg/dL
Leukocytes,Ua: NEGATIVE
Nitrite: NEGATIVE
Protein, ur: NEGATIVE mg/dL
Specific Gravity, Urine: 1.026 (ref 1.005–1.030)
Squamous Epithelial / HPF: NONE SEEN (ref 0–5)
pH: 6 (ref 5.0–8.0)

## 2020-11-03 LAB — CBC
HCT: 35.2 % — ABNORMAL LOW (ref 36.0–46.0)
Hemoglobin: 11.8 g/dL — ABNORMAL LOW (ref 12.0–15.0)
MCH: 25.8 pg — ABNORMAL LOW (ref 26.0–34.0)
MCHC: 33.5 g/dL (ref 30.0–36.0)
MCV: 77 fL — ABNORMAL LOW (ref 80.0–100.0)
Platelets: 202 10*3/uL (ref 150–400)
RBC: 4.57 MIL/uL (ref 3.87–5.11)
RDW: 14.7 % (ref 11.5–15.5)
WBC: 7.4 10*3/uL (ref 4.0–10.5)
nRBC: 0 % (ref 0.0–0.2)

## 2020-11-03 LAB — BASIC METABOLIC PANEL
Anion gap: 11 (ref 5–15)
BUN: 17 mg/dL (ref 8–23)
CO2: 27 mmol/L (ref 22–32)
Calcium: 9.5 mg/dL (ref 8.9–10.3)
Chloride: 97 mmol/L — ABNORMAL LOW (ref 98–111)
Creatinine, Ser: 0.76 mg/dL (ref 0.44–1.00)
GFR, Estimated: >60 mL/min (ref >60)
Glucose, Bld: 275 mg/dL — ABNORMAL HIGH (ref 70–99)
Potassium: 4.3 mmol/L (ref 3.5–5.1)
Sodium: 135 mmol/L (ref 135–145)

## 2020-11-03 LAB — SURGICAL PCR SCREEN
MRSA, PCR: NEGATIVE
Staphylococcus aureus: POSITIVE — AB

## 2020-11-03 LAB — TYPE AND SCREEN
ABO/RH(D): O POS
Antibody Screen: NEGATIVE

## 2020-11-03 LAB — APTT: aPTT: 26 seconds (ref 24–36)

## 2020-11-03 LAB — PROTIME-INR
INR: 0.9 (ref 0.8–1.2)
Prothrombin Time: 12.1 seconds (ref 11.4–15.2)

## 2020-11-03 NOTE — Patient Instructions (Addendum)
Your procedure is scheduled on: 11/15/20 Report to [place]. To find out your arrival time please call [phone] between 1PM - 3PM on 11/12/20.  Remember: Instructions that are not followed completely may result in serious medical risk, up to and including death, or upon the discretion of your surgeon and anesthesiologist your surgery may need to be rescheduled.     _X__ 1. Do not eat food after midnight the night before your procedure.                 No gum chewing or hard candies. You may drink clear liquids up to 2 hours                 before you are scheduled to arrive for your surgery- DO not drink clear                 liquids within 2 hours of the start of your surgery.                                   Diabetics water only  __X__2.  On the morning of surgery brush your teeth with toothpaste and water, you                 may rinse your mouth with mouthwash if you wish.  Do not swallow any              toothpaste of mouthwash.     _X__ 3.  No Alcohol for 24 hours before or after surgery.   _X__ 4.  Do Not Smoke or use e-cigarettes For 24 Hours Prior to Your Surgery.                 Do not use any chewable tobacco products for at least 6 hours prior to                 surgery.  ____  5.  Bring all medications with you on the day of surgery if instructed.   __X__  6.  Notify your doctor if there is any change in your medical condition      (cold, fever, infections).     Do not wear jewelry, make-up, hairpins, clips or nail polish. Do not wear lotions, powders, or perfumes.  Do not shave 48 hours prior to surgery. Men may shave face and neck. Do not bring valuables to the hospital.    [facility] is not responsible for any belongings or valuables.  Contacts, dentures/partials or body piercings may not be worn into surgery. Bring a case for your contacts, glasses or hearing aids, a denture cup will be supplied. Leave  your suitcase in the car. After surgery it may be brought to your room. For patients admitted to the hospital, discharge time is determined by your treatment team.   Patients discharged the day of surgery will not be allowed to drive home.   Please read over the following fact sheets that you were given:   MRSA Information  __X__ Take these medicines the morning of surgery with A SIP OF WATER:    1. buPROPion (WELLBUTRIN SR) 100 MG 12 hr tablet  2. Esomeprazole Magnesium 20 MG TBEC  3. gabapentin (NEURONTIN) 600 MG capsule  4. sertraline (ZOLOFT) 200 MG tablet  5. hydrOXYzine (VISTARIL) 25 MG capsule if needed for anxiety  6. May take allergy medications if needed  ____ Fleet Enema (as directed)   __X__ Use CHG Soap/SAGE wipes as directed  ____ Use inhalers on the day of surgery  __X__ Stop metformin/Janumet/Farxiga 2 days prior to surgery    ____ Take 1/2 of usual insulin dose the night before surgery. No insulin the morning          of surgery.   ____ Stop Blood Thinners Coumadin/Plavix/Xarelto/Pleta/Pradaxa/Eliquis/Effient/Aspirin  on   Or contact your Surgeon, Cardiologist or Medical Doctor regarding  ability to stop your blood thinners  __X__ Stop Anti-inflammatories 7 days before surgery such as Advil, Ibuprofen, Motrin,  BC or Goodies Powder, Naprosyn, Naproxen, Aleve, Aspirin   HOLD MELOXICAM FOR 1 WEEK PRIOR TO SURGERY  __X__ Stop all herbal supplements, fish oil or vitamin E until after surgery.    ____ Bring C-Pap to the hospital.

## 2020-11-04 DIAGNOSIS — F332 Major depressive disorder, recurrent severe without psychotic features: Secondary | ICD-10-CM | POA: Diagnosis not present

## 2020-11-05 DIAGNOSIS — F332 Major depressive disorder, recurrent severe without psychotic features: Secondary | ICD-10-CM | POA: Diagnosis not present

## 2020-11-09 DIAGNOSIS — F332 Major depressive disorder, recurrent severe without psychotic features: Secondary | ICD-10-CM | POA: Diagnosis not present

## 2020-11-10 DIAGNOSIS — F332 Major depressive disorder, recurrent severe without psychotic features: Secondary | ICD-10-CM | POA: Diagnosis not present

## 2020-11-10 NOTE — Progress Notes (Signed)
  Perioperative Services: Pre-Admission/Anesthesia Testing  Abnormal Lab Notification    Date: 11/10/20  Name: Katie Buckley MRN:   [id number]  Re: Abnormal labs noted during PAT appointment   Provider(s) Notified: Deetta Perla, MD Notification mode: Routed and/or faxed via [facility]   ABNORMAL LAB VALUE(S): Lab Results  Component Value Date   GLUCOSE 275 (H) 11/03/2020    Notes: Patient is scheduled for an ACDF on 11/15/2020 with Dr. Deetta Perla.  Patient with a T2DM diagnosis. She is currently on oral (empagliflozin, glipizide, metformin) therapy. Last Hgb A1c was 9.9% on 10/27/2020. This communication is being sent in order to determine if patient is deemed to have adequate preoperative glycemic control in efforts to reduce her risk of developing perioperative complications and postoperative SSI.   Given patient's past medical history significant for multiple comorbidities, presurgical clearance was sought from patient's PCP Caryn Section, MD).  Per PCP, "patient is and overall MODERATE risk for the planned procedure.  Patient has poorly controlled diabetes; hemoglobin A1c 9.9% on 10/27/2020.  She has poorly controlled hypertension; BP 156/65.  If glycemic control is significant factor on outcome of anticipated surgery, then would consider postponing.  There are no cardiac contraindications".   With that being said, the benefit of improving glycemic control must be weighed against the overall risk associated with delaying a necessary elective procedure for this patient.   This is a Community education officer; no formal response is required.  Honor Loh, MSN, APRN, FNP-C, CEN [facility]  Peri-operative Services Nurse Practitioner Phone: [phone] 11/10/20 9:32 AM

## 2020-11-11 ENCOUNTER — Other Ambulatory Visit: Payer: Self-pay

## 2020-11-11 ENCOUNTER — Other Ambulatory Visit
Admission: RE | Admit: 2020-11-11 | Discharge: 2020-11-11 | Disposition: A | Discharge status: Home / Self Care | Payer: PPO | Source: Ambulatory Visit | Attending: Neurosurgery | Admitting: Neurosurgery

## 2020-11-11 DIAGNOSIS — Z20822 Contact with and (suspected) exposure to covid-19: Secondary | ICD-10-CM | POA: Insufficient documentation

## 2020-11-11 DIAGNOSIS — Z01812 Encounter for preprocedural laboratory examination: Secondary | ICD-10-CM | POA: Insufficient documentation

## 2020-11-11 DIAGNOSIS — F332 Major depressive disorder, recurrent severe without psychotic features: Secondary | ICD-10-CM | POA: Diagnosis not present

## 2020-11-12 DIAGNOSIS — F332 Major depressive disorder, recurrent severe without psychotic features: Secondary | ICD-10-CM | POA: Diagnosis not present

## 2020-11-12 LAB — SARS CORONAVIRUS 2 (TAT 6-24 HRS): SARS Coronavirus 2: NEGATIVE

## 2020-11-13 DIAGNOSIS — F411 Generalized anxiety disorder: Secondary | ICD-10-CM | POA: Diagnosis not present

## 2020-11-13 DIAGNOSIS — F331 Major depressive disorder, recurrent, moderate: Secondary | ICD-10-CM | POA: Diagnosis not present

## 2020-11-13 DIAGNOSIS — G4700 Insomnia, unspecified: Secondary | ICD-10-CM | POA: Diagnosis not present

## 2020-11-13 DIAGNOSIS — R41840 Attention and concentration deficit: Secondary | ICD-10-CM | POA: Diagnosis not present

## 2020-11-15 ENCOUNTER — Encounter
Admission: RE | Disposition: A | Discharge status: Home / Self Care | Payer: Self-pay | Source: Home / Self Care | Attending: Neurosurgery

## 2020-11-15 ENCOUNTER — Ambulatory Visit: Payer: PPO

## 2020-11-15 ENCOUNTER — Ambulatory Visit: Payer: PPO | Admitting: Urgent Care

## 2020-11-15 ENCOUNTER — Encounter: Payer: Self-pay | Admitting: Neurosurgery

## 2020-11-15 ENCOUNTER — Inpatient Hospital Stay
Admission: RE | Admit: 2020-11-15 | Discharge: 2020-11-16 | DRG: 473 | Disposition: A | Discharge status: Home / Self Care | Payer: PPO | Attending: Neurosurgery | Admitting: Neurosurgery

## 2020-11-15 ENCOUNTER — Other Ambulatory Visit: Payer: Self-pay

## 2020-11-15 ENCOUNTER — Observation Stay: Payer: PPO

## 2020-11-15 DIAGNOSIS — Z8614 Personal history of Methicillin resistant Staphylococcus aureus infection: Secondary | ICD-10-CM

## 2020-11-15 DIAGNOSIS — Z833 Family history of diabetes mellitus: Secondary | ICD-10-CM | POA: Diagnosis not present

## 2020-11-15 DIAGNOSIS — M4802 Spinal stenosis, cervical region: Secondary | ICD-10-CM | POA: Diagnosis present

## 2020-11-15 DIAGNOSIS — Z9049 Acquired absence of other specified parts of digestive tract: Secondary | ICD-10-CM

## 2020-11-15 DIAGNOSIS — M5001 Cervical disc disorder with myelopathy,  high cervical region: Secondary | ICD-10-CM | POA: Diagnosis not present

## 2020-11-15 DIAGNOSIS — K219 Gastro-esophageal reflux disease without esophagitis: Secondary | ICD-10-CM | POA: Diagnosis present

## 2020-11-15 DIAGNOSIS — J302 Other seasonal allergic rhinitis: Secondary | ICD-10-CM | POA: Diagnosis present

## 2020-11-15 DIAGNOSIS — F32A Depression, unspecified: Secondary | ICD-10-CM | POA: Diagnosis present

## 2020-11-15 DIAGNOSIS — Z9882 Breast implant status: Secondary | ICD-10-CM | POA: Diagnosis not present

## 2020-11-15 DIAGNOSIS — K2270 Barrett's esophagus without dysplasia: Secondary | ICD-10-CM | POA: Diagnosis present

## 2020-11-15 DIAGNOSIS — Z803 Family history of malignant neoplasm of breast: Secondary | ICD-10-CM | POA: Diagnosis not present

## 2020-11-15 DIAGNOSIS — Z9889 Other specified postprocedural states: Secondary | ICD-10-CM | POA: Diagnosis not present

## 2020-11-15 DIAGNOSIS — Z419 Encounter for procedure for purposes other than remedying health state, unspecified: Secondary | ICD-10-CM

## 2020-11-15 DIAGNOSIS — F419 Anxiety disorder, unspecified: Secondary | ICD-10-CM | POA: Diagnosis present

## 2020-11-15 DIAGNOSIS — G959 Disease of spinal cord, unspecified: Secondary | ICD-10-CM | POA: Diagnosis not present

## 2020-11-15 DIAGNOSIS — I10 Essential (primary) hypertension: Secondary | ICD-10-CM | POA: Diagnosis present

## 2020-11-15 DIAGNOSIS — M2578 Osteophyte, vertebrae: Secondary | ICD-10-CM | POA: Diagnosis present

## 2020-11-15 DIAGNOSIS — Z7984 Long term (current) use of oral hypoglycemic drugs: Secondary | ICD-10-CM

## 2020-11-15 DIAGNOSIS — Z801 Family history of malignant neoplasm of trachea, bronchus and lung: Secondary | ICD-10-CM

## 2020-11-15 DIAGNOSIS — I350 Nonrheumatic aortic (valve) stenosis: Secondary | ICD-10-CM | POA: Diagnosis present

## 2020-11-15 DIAGNOSIS — Z881 Allergy status to other antibiotic agents status: Secondary | ICD-10-CM | POA: Diagnosis not present

## 2020-11-15 DIAGNOSIS — M4712 Other spondylosis with myelopathy, cervical region: Secondary | ICD-10-CM | POA: Diagnosis present

## 2020-11-15 DIAGNOSIS — Z981 Arthrodesis status: Secondary | ICD-10-CM | POA: Diagnosis not present

## 2020-11-15 DIAGNOSIS — Z79899 Other long term (current) drug therapy: Secondary | ICD-10-CM | POA: Diagnosis not present

## 2020-11-15 DIAGNOSIS — Z85828 Personal history of other malignant neoplasm of skin: Secondary | ICD-10-CM | POA: Diagnosis not present

## 2020-11-15 DIAGNOSIS — Z800 Family history of malignant neoplasm of digestive organs: Secondary | ICD-10-CM | POA: Diagnosis not present

## 2020-11-15 DIAGNOSIS — E119 Type 2 diabetes mellitus without complications: Secondary | ICD-10-CM | POA: Diagnosis present

## 2020-11-15 DIAGNOSIS — M4322 Fusion of spine, cervical region: Secondary | ICD-10-CM | POA: Diagnosis not present

## 2020-11-15 DIAGNOSIS — Z888 Allergy status to other drugs, medicaments and biological substances status: Secondary | ICD-10-CM | POA: Diagnosis not present

## 2020-11-15 HISTORY — DX: Type 2 diabetes mellitus without complications: E11.9

## 2020-11-15 HISTORY — PX: ANTERIOR CERVICAL DECOMP/DISCECTOMY FUSION: SHX1161

## 2020-11-15 HISTORY — DX: Polyneuropathy, unspecified: G62.9

## 2020-11-15 HISTORY — DX: Personal history of other diseases of the nervous system and sense organs: Z86.69

## 2020-11-15 HISTORY — DX: Nonrheumatic aortic (valve) stenosis: I35.0

## 2020-11-15 HISTORY — DX: Personal history of other diseases of the digestive system: Z87.19

## 2020-11-15 LAB — GLUCOSE, CAPILLARY
Glucose-Capillary: 304 mg/dL — ABNORMAL HIGH (ref 70–99)
Glucose-Capillary: 238 mg/dL — ABNORMAL HIGH (ref 70–99)
Glucose-Capillary: 316 mg/dL — ABNORMAL HIGH (ref 70–99)
Glucose-Capillary: 319 mg/dL — ABNORMAL HIGH (ref 70–99)

## 2020-11-15 LAB — BASIC METABOLIC PANEL
Anion gap: 10 (ref 5–15)
BUN: 17 mg/dL (ref 8–23)
CO2: 26 mmol/L (ref 22–32)
Calcium: 9.1 mg/dL (ref 8.9–10.3)
Chloride: 98 mmol/L (ref 98–111)
Creatinine, Ser: 0.79 mg/dL (ref 0.44–1.00)
GFR, Estimated: >60 mL/min (ref >60)
Glucose, Bld: 282 mg/dL — ABNORMAL HIGH (ref 70–99)
Potassium: 4 mmol/L (ref 3.5–5.1)
Sodium: 134 mmol/L — ABNORMAL LOW (ref 135–145)

## 2020-11-15 LAB — SURGICAL PCR SCREEN
MRSA, PCR: NEGATIVE
Staphylococcus aureus: POSITIVE — AB

## 2020-11-15 LAB — PROTIME-INR
INR: 0.9 (ref 0.8–1.2)
Prothrombin Time: 12.6 seconds (ref 11.4–15.2)

## 2020-11-15 LAB — ABO/RH: ABO/RH(D): O POS

## 2020-11-15 SURGERY — ANTERIOR CERVICAL DECOMPRESSION/DISCECTOMY FUSION 1 LEVEL
Anesthesia: General

## 2020-11-15 MED ORDER — FENTANYL CITRATE (PF) 100 MCG/2ML IJ SOLN
INTRAMUSCULAR | Status: AC
  Filled 2020-11-15: qty 2

## 2020-11-15 MED ORDER — DEXMEDETOMIDINE (PRECEDEX) IN NS 20 MCG/5ML (4 MCG/ML) IV SYRINGE
PREFILLED_SYRINGE | INTRAVENOUS | Status: DC | PRN
  Administered 2020-11-15: 8 ug via INTRAVENOUS

## 2020-11-15 MED ORDER — LIDOCAINE-EPINEPHRINE 1 %-1:100000 IJ SOLN
INTRAMUSCULAR | Status: DC | PRN
  Administered 2020-11-15: 7 mL

## 2020-11-15 MED ORDER — REMIFENTANIL HCL 1 MG IV SOLR
INTRAVENOUS | Status: AC
  Filled 2020-11-15: qty 1000

## 2020-11-15 MED ORDER — EPHEDRINE 5 MG/ML INJ
INTRAVENOUS | Status: AC
  Filled 2020-11-15: qty 10

## 2020-11-15 MED ORDER — SODIUM CHLORIDE (PF) 0.9 % IJ SOLN
INTRAMUSCULAR | Status: AC
  Filled 2020-11-15: qty 20

## 2020-11-15 MED ORDER — AZELASTINE HCL 0.1 % NA SOLN
1.0000 | Freq: Two times a day (BID) | NASAL | Status: DC | PRN
  Filled 2020-11-15: qty 30

## 2020-11-15 MED ORDER — FENTANYL CITRATE (PF) 100 MCG/2ML IJ SOLN
25.0000 ug | INTRAMUSCULAR | Status: DC | PRN
  Administered 2020-11-15 (×2): 25 ug via INTRAVENOUS

## 2020-11-15 MED ORDER — EZETIMIBE 10 MG PO TABS
10.0000 mg | ORAL_TABLET | Freq: Every day | ORAL | Status: DC
  Administered 2020-11-15: 10 mg via ORAL
  Filled 2020-11-15 (×2): qty 1

## 2020-11-15 MED ORDER — HYDRALAZINE HCL 20 MG/ML IJ SOLN
INTRAMUSCULAR | Status: AC
  Filled 2020-11-15: qty 1

## 2020-11-15 MED ORDER — ROSUVASTATIN CALCIUM 10 MG PO TABS
20.0000 mg | ORAL_TABLET | Freq: Every day | ORAL | Status: DC
  Administered 2020-11-15: 20 mg via ORAL
  Filled 2020-11-15: qty 2

## 2020-11-15 MED ORDER — ACETAMINOPHEN 650 MG RE SUPP: 650.0000 mg | RECTAL | Status: DC | PRN

## 2020-11-15 MED ORDER — ROCURONIUM BROMIDE 100 MG/10ML IV SOLN
INTRAVENOUS | Status: DC | PRN
  Administered 2020-11-15: 10 mg via INTRAVENOUS

## 2020-11-15 MED ORDER — ACETAMINOPHEN 10 MG/ML IV SOLN
INTRAVENOUS | Status: DC | PRN
  Administered 2020-11-15: 1000 mg via INTRAVENOUS

## 2020-11-15 MED ORDER — MEPERIDINE HCL 25 MG/ML IJ SOLN: 6.2500 mg | INTRAMUSCULAR | Status: DC | PRN

## 2020-11-15 MED ORDER — MIDAZOLAM HCL 2 MG/2ML IJ SOLN
INTRAMUSCULAR | Status: DC | PRN
  Administered 2020-11-15: 2 mg via INTRAVENOUS

## 2020-11-15 MED ORDER — OXYCODONE HCL 5 MG/5ML PO SOLN
5.0000 mg | Freq: Once | ORAL | Status: DC | PRN
Start: 2020-11-15 — End: 2020-11-15

## 2020-11-15 MED ORDER — FENTANYL CITRATE (PF) 100 MCG/2ML IJ SOLN
INTRAMUSCULAR | Status: AC
  Administered 2020-11-15: 25 ug via INTRAVENOUS
  Filled 2020-11-15: qty 2

## 2020-11-15 MED ORDER — FENTANYL CITRATE (PF) 100 MCG/2ML IJ SOLN
INTRAMUSCULAR | Status: DC | PRN
  Administered 2020-11-15 (×3): 50 ug via INTRAVENOUS

## 2020-11-15 MED ORDER — MIDAZOLAM HCL 2 MG/2ML IJ SOLN
INTRAMUSCULAR | Status: AC
  Filled 2020-11-15: qty 2

## 2020-11-15 MED ORDER — METOPROLOL TARTRATE 50 MG PO TABS: 50.0000 mg | ORAL_TABLET | Freq: Once | ORAL | Status: AC

## 2020-11-15 MED ORDER — PROPOFOL 1000 MG/100ML IV EMUL
INTRAVENOUS | Status: AC
  Filled 2020-11-15: qty 100

## 2020-11-15 MED ORDER — CHLORHEXIDINE GLUCONATE CLOTH 2 % EX PADS
6.0000 | MEDICATED_PAD | Freq: Every day | CUTANEOUS | Status: DC
  Administered 2020-11-16: 6 via TOPICAL

## 2020-11-15 MED ORDER — SODIUM CHLORIDE 0.9 % IV SOLN
INTRAVENOUS | Status: DC | PRN
  Administered 2020-11-15: 20 ug/min via INTRAVENOUS

## 2020-11-15 MED ORDER — METOPROLOL TARTRATE 50 MG PO TABS
50.0000 mg | ORAL_TABLET | Freq: Every day | ORAL | Status: DC
  Administered 2020-11-16: 50 mg via ORAL
  Filled 2020-11-15: qty 1

## 2020-11-15 MED ORDER — BUTALBITAL-APAP-CAFFEINE 50-325-40 MG PO TABS: 1.0000 | ORAL_TABLET | Freq: Four times a day (QID) | ORAL | Status: DC | PRN

## 2020-11-15 MED ORDER — CEFAZOLIN SODIUM-DEXTROSE 2-4 GM/100ML-% IV SOLN
INTRAVENOUS | Status: AC
  Filled 2020-11-15: qty 100

## 2020-11-15 MED ORDER — GABAPENTIN 300 MG PO CAPS
600.0000 mg | ORAL_CAPSULE | Freq: Two times a day (BID) | ORAL | Status: DC
  Administered 2020-11-15 – 2020-11-16 (×2): 600 mg via ORAL
  Filled 2020-11-15 (×2): qty 2

## 2020-11-15 MED ORDER — CEFAZOLIN SODIUM-DEXTROSE 2-4 GM/100ML-% IV SOLN
2.0000 g | INTRAVENOUS | Status: AC
  Administered 2020-11-15: 2 g via INTRAVENOUS

## 2020-11-15 MED ORDER — METHOCARBAMOL 1000 MG/10ML IJ SOLN
500.0000 mg | Freq: Four times a day (QID) | INTRAVENOUS | Status: DC | PRN
  Filled 2020-11-15 (×2): qty 5

## 2020-11-15 MED ORDER — ONDANSETRON HCL 4 MG/2ML IJ SOLN: 4.0000 mg | Freq: Four times a day (QID) | INTRAMUSCULAR | Status: DC | PRN

## 2020-11-15 MED ORDER — HYDROCHLOROTHIAZIDE 12.5 MG PO CAPS
12.5000 mg | ORAL_CAPSULE | Freq: Every day | ORAL | Status: DC
  Administered 2020-11-16: 12.5 mg via ORAL
  Filled 2020-11-15: qty 1

## 2020-11-15 MED ORDER — ONDANSETRON HCL 4 MG PO TABS
4.0000 mg | ORAL_TABLET | Freq: Four times a day (QID) | ORAL | Status: DC | PRN
  Administered 2020-11-16: 4 mg via ORAL
  Filled 2020-11-15: qty 1

## 2020-11-15 MED ORDER — CHLORHEXIDINE GLUCONATE 0.12 % MT SOLN
OROMUCOSAL | Status: AC
  Administered 2020-11-15: 15 mL via OROMUCOSAL
  Filled 2020-11-15: qty 15

## 2020-11-15 MED ORDER — MONTELUKAST SODIUM 10 MG PO TABS: 10.0000 mg | ORAL_TABLET | Freq: Every day | ORAL | Status: DC | PRN

## 2020-11-15 MED ORDER — SERTRALINE HCL 50 MG PO TABS
200.0000 mg | ORAL_TABLET | Freq: Every day | ORAL | Status: DC
  Administered 2020-11-16: 200 mg via ORAL
  Filled 2020-11-15: qty 4

## 2020-11-15 MED ORDER — SODIUM CHLORIDE 0.9 % IV SOLN: INTRAVENOUS | Status: DC

## 2020-11-15 MED ORDER — OXYCODONE HCL 5 MG PO TABS: 5.0000 mg | ORAL_TABLET | Freq: Once | ORAL | Status: DC | PRN

## 2020-11-15 MED ORDER — GLIPIZIDE 5 MG PO TABS
10.0000 mg | ORAL_TABLET | Freq: Two times a day (BID) | ORAL | Status: DC
  Administered 2020-11-15 – 2020-11-16 (×2): 10 mg via ORAL
  Filled 2020-11-15 (×2): qty 2

## 2020-11-15 MED ORDER — TRAZODONE HCL 50 MG PO TABS
50.0000 mg | ORAL_TABLET | Freq: Every evening | ORAL | Status: DC | PRN
  Administered 2020-11-15: 100 mg via ORAL
  Filled 2020-11-15: qty 2

## 2020-11-15 MED ORDER — PANTOPRAZOLE SODIUM 40 MG PO TBEC
40.0000 mg | DELAYED_RELEASE_TABLET | Freq: Every day | ORAL | Status: DC
  Administered 2020-11-16: 40 mg via ORAL
  Filled 2020-11-15: qty 1

## 2020-11-15 MED ORDER — SODIUM CHLORIDE 0.9% FLUSH
3.0000 mL | Freq: Two times a day (BID) | INTRAVENOUS | Status: DC
  Administered 2020-11-15 – 2020-11-16 (×3): 3 mL via INTRAVENOUS

## 2020-11-15 MED ORDER — HYDRALAZINE HCL 20 MG/ML IJ SOLN
INTRAMUSCULAR | Status: DC | PRN
  Administered 2020-11-15 (×2): 5 mg via INTRAVENOUS

## 2020-11-15 MED ORDER — MENTHOL 3 MG MT LOZG
1.0000 | LOZENGE | OROMUCOSAL | Status: DC | PRN
  Administered 2020-11-15: 3 mg via ORAL
  Filled 2020-11-15 (×2): qty 9

## 2020-11-15 MED ORDER — THROMBIN 5000 UNITS EX SOLR
CUTANEOUS | Status: AC
  Filled 2020-11-15: qty 5000

## 2020-11-15 MED ORDER — DEXAMETHASONE SODIUM PHOSPHATE 10 MG/ML IJ SOLN
INTRAMUSCULAR | Status: DC | PRN
  Administered 2020-11-15: 10 mg via INTRAVENOUS

## 2020-11-15 MED ORDER — IRBESARTAN 150 MG PO TABS
75.0000 mg | ORAL_TABLET | Freq: Every day | ORAL | Status: DC
  Administered 2020-11-16: 75 mg via ORAL
  Filled 2020-11-15: qty 1

## 2020-11-15 MED ORDER — PROPOFOL 10 MG/ML IV BOLUS
INTRAVENOUS | Status: DC | PRN
  Administered 2020-11-15: 120 mg via INTRAVENOUS

## 2020-11-15 MED ORDER — EPHEDRINE SULFATE 50 MG/ML IJ SOLN
INTRAMUSCULAR | Status: DC | PRN
  Administered 2020-11-15: 5 mg via INTRAVENOUS

## 2020-11-15 MED ORDER — OXYCODONE HCL 5 MG PO TABS
5.0000 mg | ORAL_TABLET | ORAL | Status: DC | PRN
  Administered 2020-11-15 – 2020-11-16 (×7): 5 mg via ORAL
  Filled 2020-11-15 (×8): qty 1

## 2020-11-15 MED ORDER — REMIFENTANIL HCL 1 MG IV SOLR
INTRAVENOUS | Status: DC | PRN
  Administered 2020-11-15: .5 ug/kg/min via INTRAVENOUS

## 2020-11-15 MED ORDER — GELATIN ABSORBABLE 12-7 MM EX MISC
CUTANEOUS | Status: AC
  Filled 2020-11-15: qty 1

## 2020-11-15 MED ORDER — THROMBIN 5000 UNITS EX SOLR
CUTANEOUS | Status: DC | PRN
  Administered 2020-11-15: 5000 [IU] via TOPICAL

## 2020-11-15 MED ORDER — SUCCINYLCHOLINE CHLORIDE 20 MG/ML IJ SOLN
INTRAMUSCULAR | Status: DC | PRN
  Administered 2020-11-15: 100 mg via INTRAVENOUS

## 2020-11-15 MED ORDER — SODIUM CHLORIDE 0.9 % IV SOLN
250.0000 mL | INTRAVENOUS | Status: DC
  Administered 2020-11-15: 250 mL via INTRAVENOUS

## 2020-11-15 MED ORDER — DEXAMETHASONE SODIUM PHOSPHATE 10 MG/ML IJ SOLN
INTRAMUSCULAR | Status: AC
  Filled 2020-11-15: qty 1

## 2020-11-15 MED ORDER — ACETAMINOPHEN 325 MG PO TABS
650.0000 mg | ORAL_TABLET | ORAL | Status: DC | PRN
  Administered 2020-11-15 – 2020-11-16 (×3): 650 mg via ORAL
  Filled 2020-11-15 (×2): qty 2

## 2020-11-15 MED ORDER — LIDOCAINE-EPINEPHRINE 1 %-1:100000 IJ SOLN
INTRAMUSCULAR | Status: AC
  Filled 2020-11-15: qty 1

## 2020-11-15 MED ORDER — EMPAGLIFLOZIN 25 MG PO TABS
25.0000 mg | ORAL_TABLET | Freq: Every day | ORAL | Status: DC
  Administered 2020-11-16: 25 mg via ORAL
  Filled 2020-11-15: qty 1

## 2020-11-15 MED ORDER — OLOPATADINE HCL 0.1 % OP SOLN
1.0000 [drp] | Freq: Two times a day (BID) | OPHTHALMIC | Status: DC | PRN
  Filled 2020-11-15: qty 5

## 2020-11-15 MED ORDER — BUPROPION HCL ER (SR) 100 MG PO TB12
100.0000 mg | ORAL_TABLET | Freq: Two times a day (BID) | ORAL | Status: DC
  Administered 2020-11-15: 100 mg via ORAL
  Filled 2020-11-15 (×3): qty 1

## 2020-11-15 MED ORDER — METOPROLOL TARTRATE 50 MG PO TABS
ORAL_TABLET | ORAL | Status: AC
  Administered 2020-11-15: 50 mg via ORAL
  Filled 2020-11-15: qty 1

## 2020-11-15 MED ORDER — METFORMIN HCL ER 500 MG PO TB24
500.0000 mg | ORAL_TABLET | Freq: Every day | ORAL | Status: DC
  Administered 2020-11-16: 500 mg via ORAL
  Filled 2020-11-15: qty 1

## 2020-11-15 MED ORDER — SODIUM CHLORIDE FLUSH 0.9 % IV SOLN
INTRAVENOUS | Status: AC
  Filled 2020-11-15: qty 10

## 2020-11-15 MED ORDER — ACETAMINOPHEN 10 MG/ML IV SOLN
INTRAVENOUS | Status: AC
  Filled 2020-11-15: qty 100

## 2020-11-15 MED ORDER — LIDOCAINE HCL (PF) 2 % IJ SOLN
INTRAMUSCULAR | Status: AC
  Filled 2020-11-15: qty 5

## 2020-11-15 MED ORDER — PROMETHAZINE HCL 25 MG/ML IJ SOLN: 6.2500 mg | INTRAMUSCULAR | Status: DC | PRN

## 2020-11-15 MED ORDER — SODIUM CHLORIDE 0.9% FLUSH: 3.0000 mL | INTRAVENOUS | Status: DC | PRN

## 2020-11-15 MED ORDER — HYDROXYZINE PAMOATE 25 MG PO CAPS
25.0000 mg | ORAL_CAPSULE | Freq: Every day | ORAL | Status: DC | PRN
  Filled 2020-11-15: qty 1

## 2020-11-15 MED ORDER — SENNA 8.6 MG PO TABS
1.0000 | ORAL_TABLET | Freq: Two times a day (BID) | ORAL | Status: DC
  Administered 2020-11-15 – 2020-11-16 (×2): 8.6 mg via ORAL
  Filled 2020-11-15 (×2): qty 1

## 2020-11-15 MED ORDER — ONDANSETRON HCL 4 MG/2ML IJ SOLN
INTRAMUSCULAR | Status: AC
  Filled 2020-11-15: qty 2

## 2020-11-15 MED ORDER — MUPIROCIN 2 % EX OINT
1.0000 "application " | TOPICAL_OINTMENT | Freq: Two times a day (BID) | CUTANEOUS | Status: DC
  Administered 2020-11-15 – 2020-11-16 (×2): 1 via NASAL
  Filled 2020-11-15: qty 22

## 2020-11-15 MED ORDER — ONDANSETRON HCL 4 MG/2ML IJ SOLN
INTRAMUSCULAR | Status: DC | PRN
  Administered 2020-11-15: 4 mg via INTRAVENOUS

## 2020-11-15 MED ORDER — LIDOCAINE HCL (CARDIAC) PF 100 MG/5ML IV SOSY
PREFILLED_SYRINGE | INTRAVENOUS | Status: DC | PRN
  Administered 2020-11-15: 100 mg via INTRAVENOUS

## 2020-11-15 MED ORDER — PROPOFOL 500 MG/50ML IV EMUL
INTRAVENOUS | Status: DC | PRN
  Administered 2020-11-15: 150 ug/kg/min via INTRAVENOUS

## 2020-11-15 MED ORDER — METOPROLOL-HYDROCHLOROTHIAZIDE 100-25 MG PO TABS: 0.5000 | ORAL_TABLET | Freq: Every day | ORAL | Status: DC

## 2020-11-15 MED ORDER — ESOMEPRAZOLE MAGNESIUM 20 MG PO TBEC: 20.0000 mg | DELAYED_RELEASE_TABLET | Freq: Every day | ORAL | Status: DC

## 2020-11-15 MED ORDER — INSULIN ASPART 100 UNIT/ML IJ SOLN
0.0000 [IU] | Freq: Three times a day (TID) | INTRAMUSCULAR | Status: DC
  Administered 2020-11-15 – 2020-11-16 (×2): 7 [IU] via SUBCUTANEOUS
  Filled 2020-11-15 (×2): qty 1

## 2020-11-15 MED ORDER — CHLORHEXIDINE GLUCONATE 0.12 % MT SOLN: 15.0000 mL | Freq: Once | OROMUCOSAL | Status: AC

## 2020-11-15 MED ORDER — ORAL CARE MOUTH RINSE: 15.0000 mL | Freq: Once | OROMUCOSAL | Status: AC

## 2020-11-15 MED ORDER — PHENOL 1.4 % MT LIQD
1.0000 | OROMUCOSAL | Status: DC | PRN
  Filled 2020-11-15 (×2): qty 177

## 2020-11-15 MED ORDER — METHOCARBAMOL 500 MG PO TABS
500.0000 mg | ORAL_TABLET | Freq: Four times a day (QID) | ORAL | Status: DC | PRN
  Administered 2020-11-16 (×2): 500 mg via ORAL
  Filled 2020-11-15 (×2): qty 1

## 2020-11-15 SURGICAL SUPPLY — 64 items
ALLOGRAFT LORDOTIC 8X11X14 (Bone Implant) ×2 IMPLANT
BIT DRILL 13 (BIT) IMPLANT
BIT DRILL ACP 11 (DRILL) ×1 IMPLANT
BLADE BOVIE TIP EXT 4 (BLADE) ×2 IMPLANT
BLADE SURG 15 STRL LF DISP TIS (BLADE) ×1 IMPLANT
BLADE SURG 15 STRL SS (BLADE) ×1
BUR DIAMOND COARSE 4.0 RND (BURR) IMPLANT
BUR NEURO DRILL SOFT 3.0X3.8M (BURR) ×2 IMPLANT
CANISTER SUCT 1200ML W/VALVE (MISCELLANEOUS) IMPLANT
CHLORAPREP W/TINT 26 (MISCELLANEOUS) ×2 IMPLANT
COUNTER NEEDLE 20/40 LG (NEEDLE) ×2 IMPLANT
COVER LIGHT HANDLE STERIS (MISCELLANEOUS) ×4 IMPLANT
COVER WAND RF STERILE (DRAPES) ×2 IMPLANT
CUP MEDICINE 2OZ PLAST GRAD ST (MISCELLANEOUS) ×4 IMPLANT
DERMABOND ADVANCED (GAUZE/BANDAGES/DRESSINGS) ×1
DERMABOND ADVANCED .7 DNX12 (GAUZE/BANDAGES/DRESSINGS) ×1 IMPLANT
DRAPE C-ARM 42X72 X-RAY (DRAPES) ×4 IMPLANT
DRAPE MICROSCOPE SPINE 48X150 (DRAPES) ×2 IMPLANT
DRAPE SURG 17X11 SM STRL (DRAPES) ×4 IMPLANT
DRAPE THYROID T SHEET (DRAPES) ×2 IMPLANT
DRILL ACP 11 (DRILL) ×2
ELECT CAUTERY BLADE TIP 2.5 (TIP) ×2
ELECT EZSTD 165MM 6.5IN (MISCELLANEOUS) ×2
ELECTRODE CAUTERY BLDE TIP 2.5 (TIP) ×1 IMPLANT
ELECTRODE EZSTD 165MM 6.5IN (MISCELLANEOUS) ×1 IMPLANT
FEE INTRAOP CADWELL SUPPLY NCS (MISCELLANEOUS) ×1 IMPLANT
FEE INTRAOP MONITOR IMPULS NCS (MISCELLANEOUS) ×1 IMPLANT
GAUZE SPONGE 4X4 12PLY STRL (GAUZE/BANDAGES/DRESSINGS) ×2 IMPLANT
GLOVE SRG 8 PF TXTR STRL LF DI (GLOVE) ×1 IMPLANT
GLOVE SURG SYN 7.0 (GLOVE) ×4 IMPLANT
GLOVE SURG SYN 8.0 (GLOVE) ×4 IMPLANT
GLOVE SURG UNDER LTX SZ7 (GLOVE) ×2 IMPLANT
GLOVE SURG UNDER POLY LF SZ8 (GLOVE) ×1
GOWN STRL REUS W/ TWL XL LVL3 (GOWN DISPOSABLE) ×2 IMPLANT
GOWN STRL REUS W/TWL XL LVL3 (GOWN DISPOSABLE) ×2
GRADUATE 1200CC STRL 31836 (MISCELLANEOUS) ×2 IMPLANT
INTRAOP CADWELL SUPPLY FEE NCS (MISCELLANEOUS) ×1
INTRAOP DISP SUPPLY FEE NCS (MISCELLANEOUS) ×1
INTRAOP MONITOR FEE IMPULS NCS (MISCELLANEOUS) ×1
INTRAOP MONITOR FEE IMPULSE (MISCELLANEOUS) ×1
IV CATH ANGIO 12GX3 LT BLUE (NEEDLE) IMPLANT
KIT TURNOVER KIT A (KITS) ×2 IMPLANT
MANIFOLD NEPTUNE II (INSTRUMENTS) ×2 IMPLANT
MARKER SKIN DUAL TIP RULER LAB (MISCELLANEOUS) ×4 IMPLANT
NEEDLE HYPO 22GX1.5 SAFETY (NEEDLE) ×2 IMPLANT
NEEDLE SPNL 22GX3.5 QUINCKE BK (NEEDLE) ×2 IMPLANT
NS IRRIG 1000ML POUR BTL (IV SOLUTION) ×2 IMPLANT
PACK LAMINECTOMY NEURO (CUSTOM PROCEDURE TRAY) ×2 IMPLANT
PAD ARMBOARD 7.5X6 YLW CONV (MISCELLANEOUS) ×4 IMPLANT
PIN CASPAR 14 (PIN) ×1 IMPLANT
PIN CASPAR 14MM (PIN) ×2
PLATE ACP 1-LEVEL 1.6V20 (Plate) ×2 IMPLANT
SCREW ACP 3.5 X 13 S/D VARIA (Screw) ×8 IMPLANT
SCREW ACP 3.5X13 S/D VAR ANGLE (Screw) ×4 IMPLANT
SPOGE SURGIFLO 8M (HEMOSTASIS) ×1
SPONGE KITTNER 5P (MISCELLANEOUS) ×2 IMPLANT
SPONGE SURGIFLO 8M (HEMOSTASIS) ×1 IMPLANT
SUT POLYSORB 2-0 5X18 GS-10 (SUTURE) ×4 IMPLANT
SUT VICRYL 3-0 CR8 SH (SUTURE) ×2 IMPLANT
SYR 30ML LL (SYRINGE) ×2 IMPLANT
TAPE CLOTH 3X10 WHT NS LF (GAUZE/BANDAGES/DRESSINGS) ×2 IMPLANT
TOWEL OR 17X26 4PK STRL BLUE (TOWEL DISPOSABLE) ×4 IMPLANT
TRAY FOLEY MTR SLVR 16FR STAT (SET/KITS/TRAYS/PACK) IMPLANT
TUBING CONNECTING 10 (TUBING) ×2 IMPLANT

## 2020-11-15 NOTE — Plan of Care (Signed)

## 2020-11-15 NOTE — H&P (Signed)
Katie Buckley is an 70 y.o. female.   Chief Complaint: Weakness and balance difficulty HPI: Katie Buckley is here for evaluation with ongoing symptoms of balance difficulty and weakness in her arms that she feels has been worsening over the past year. She does have a history of diabetes with A1c document over 9 which she states is normal for her. She is not currently on insulin. She does have some numbness in her toes which she states has been chronic. She denies any numbness in her upper extremities. The weakness she is having is more evident with fine motor activities such as opening bottles and holding onto things. She denies any recent falls but she does have to be careful. 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 We discussed decompression at this level and she wishes to proceed   Past Medical History:  Diagnosis Date  . Anxiety   . Aortic stenosis, mild    a.) mean gradient 18.7 mmHg on 10/2020 TTE  . Depression   . Frequent sinus infections   . GERD (gastroesophageal reflux disease) 04/21/2015  . H/O tension headache   . Heart murmur   . History of Barrett's esophagus   . Hyperchloremia   . Hypertension   . MRSA (methicillin resistant staph aureus) culture positive 2018   history of, in hand  . Neuropathy   . Palpitations   . Seasonal allergies   . 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 T2DM (type 2 diabetes mellitus) ([place])     Past Surgical History:  Procedure Laterality Date  . AUGMENTATION MAMMAPLASTY Bilateral    breast implants  . Bone Spur  2007   foot  . CARDIAC CATHETERIZATION    . Carpal Tunnel Symdrome  2008   as repeated in 2009  . CATARACT EXTRACTION W/PHACO Left 01/16/2019   Procedure: CATARACT EXTRACTION PHACO AND INTRAOCULAR LENS PLACEMENT ([place]) LEFT DIABETES VISION BLUE;  Surgeon: Marchia Meiers, MD;  Location: [facility] ORS;  Service: Ophthalmology;  Laterality: Left;  [place]   00:47 CDE 6.62 Fluid pack lot # [id number]  . CATARACT EXTRACTION W/PHACO Right 02/06/2019   Procedure: CATARACT EXTRACTION PHACO AND INTRAOCULAR LENS PLACEMENT ([facility]);  Surgeon: Marchia Meiers, MD;  Location: [facility] ORS;  Service: Ophthalmology;  Laterality: Right;  [place] 00:54.6 CDE 6.52 FLUID PACK LOT # [id number]  . CHOLECYSTECTOMY  2007  . COLONOSCOPY WITH PROPOFOL N/A 11/17/2015   Procedure: COLONOSCOPY WITH PROPOFOL;  Surgeon: Manya Silvas, MD;  Location: [facility] ENDOSCOPY;  Service: Endoscopy;  Laterality: N/A;  . ESOPHAGOGASTRODUODENOSCOPY (EGD) WITH PROPOFOL N/A 11/17/2015   Procedure: ESOPHAGOGASTRODUODENOSCOPY (EGD) WITH PROPOFOL;  Surgeon: Manya Silvas, MD;  Location: [facility] ENDOSCOPY;  Service: Endoscopy;  Laterality: N/A;  . EYE SURGERY Bilateral    cataract extraction  . FOOT SURGERY    . MANDIBLE SURGERY    . NASAL SINUS SURGERY  03/2013   Multile surgery Dr. Carlis Abbott. DX eosinophilic sinusitis [id number]  . Release of Trigger Finger Right 10/2010   Dr. Tamala Julian  . TONSILLECTOMY  1960    Family History  Problem Relation Age of Onset  . Diabetes Father   . Lung cancer Mother   . Breast cancer Maternal Aunt   . Irritable bowel syndrome Sister   . Colon cancer Brother    Social History:  reports that she has never smoked. She has never used smokeless tobacco. She reports  that she does not drink alcohol and does not use drugs.  Allergies:  Allergies  Allergen Reactions  . Doxycycline Other (See Comments)    Emotional changes/anxious  . Biaxin [Clarithromycin]     GI upset, and bad taste in mouth.    Medications Prior to Admission  Medication Sig Dispense Refill  . azelastine (ASTELIN) 0.1 % nasal spray Place 1-2 sprays into both nostrils every 12 (twelve) hours as needed.    Marland Kitchen buPROPion (WELLBUTRIN SR) 100 MG 12 hr tablet Take 100 mg by mouth 2 (two) times daily.    . butalbital-acetaminophen-caffeine (FIORICET) 50-325-40 MG tablet TAKE 1 TABLET BY MOUTH EVERY 6 HOURS AS NEEDED  FOR HEADACHE. (Patient taking differently: Take 1 tablet by mouth every 6 (six) hours as needed for headache.) 30 tablet 0  . empagliflozin (JARDIANCE) 25 MG TABS tablet Take 1 tablet (25 mg total) by mouth daily. 90 tablet 1  . Esomeprazole Magnesium 20 MG TBEC Take 20 mg by mouth daily before breakfast.    . ezetimibe (ZETIA) 10 MG tablet TAKE 1 TABLET BY MOUTH EVERYDAY AT BEDTIME (Patient taking differently: Take 10 mg by mouth at bedtime.) 90 tablet 0  . fexofenadine (ALLEGRA) 180 MG tablet Take 180 mg by mouth daily as needed for allergies or rhinitis.    Marland Kitchen gabapentin (NEURONTIN) 300 MG capsule TAKE 2 CAPSULES (600 MG TOTAL) BY MOUTH 2 (TWO) TIMES DAILY. MORNING & SUPPER (Patient taking differently: Take 600 mg by mouth 2 (two) times daily.) 360 capsule 0  . glipiZIDE (GLUCOTROL) 10 MG tablet TAKE 1 TABLET BY MOUTH DAILY BEFORE BREAKFAST. (Patient taking differently: Take 10 mg by mouth in the morning and at bedtime.) 90 tablet 0  . hydrOXYzine (VISTARIL) 25 MG capsule Take 25 mg by mouth daily as needed for anxiety.    . irbesartan (AVAPRO) 75 MG tablet Take 1 tablet (75 mg total) by mouth daily. (Patient taking differently: Take 75 mg by mouth in the morning and at bedtime.) 30 tablet 2  . Lancet Devices MISC     . meloxicam (MOBIC) 15 MG tablet Take 1 tablet (15 mg total) by mouth daily. (Patient taking differently: Take 15 mg by mouth daily as needed (pain.).) 30 tablet 0  . metFORMIN (GLUCOPHAGE-XR) 500 MG 24 hr tablet Take 2 tablets (1,000 mg total) by mouth daily. (Patient taking differently: Take 500 mg by mouth daily.) 180 tablet 3  . metoprolol-hydrochlorothiazide (LOPRESSOR HCT) 100-25 MG tablet Take 0.5 tablets by mouth daily. 45 tablet 1  . montelukast (SINGULAIR) 10 MG tablet TAKE 1 TABLET BY MOUTH EVERY DAY (Patient taking differently: Take 10 mg by mouth daily as needed (allergies).) 90 tablet 0  . Olopatadine HCl 0.7 % SOLN Apply 1 drop to eye daily as needed.    . ondansetron  (ZOFRAN ODT) 4 MG disintegrating tablet Take 1 tablet (4 mg total) by mouth every 8 (eight) hours as needed for nausea or vomiting. 20 tablet 0  . ondansetron (ZOFRAN) 4 MG tablet Take 1 tablet (4 mg total) by mouth every 8 (eight) hours as needed. 10 tablet 2  . rosuvastatin (CRESTOR) 20 MG tablet TAKE 1 TABLET BY MOUTH EVERY DAY (Patient taking differently: Take 20 mg by mouth at bedtime.) 90 tablet 1  . sertraline (ZOLOFT) 100 MG tablet Take 200 mg by mouth daily.    . traZODone (DESYREL) 50 MG tablet Take 50-100 mg by mouth at bedtime as needed for sleep.     . valACYclovir (VALTREX) 1000  MG tablet Take 1 tablet daily for 5 days as needed 30 tablet 3    Results for orders placed or performed during the hospital encounter of 11/15/20 (from the past 48 hour(s))  Glucose, capillary     Status: Abnormal   Collection Time: 11/15/20  8:46 AM  Result Value Ref Range   Glucose-Capillary 304 (H) 70 - 99 mg/dL    Comment: Glucose reference range applies only to samples taken after fasting for at least 8 hours.   No results found.  Review of Systems General ROS: Negative Psychological ROS: Negative Ophthalmic ROS: Negative ENT ROS: Negative Hematological and Lymphatic ROS: Negative  Endocrine ROS: Negative Respiratory ROS: Negative Cardiovascular ROS: Negative Gastrointestinal ROS: Negative Genito-Urinary ROS: Negative Musculoskeletal ROS: Positive for neck pain Neurological ROS: Positive for arm weakness, balance difficulty Dermatological ROS: Negative  Blood pressure (!) 158/55, pulse 76, temperature 98.3 F (36.8 C), temperature source Temporal, resp. rate 16, height 5' (1.524 m), weight 64.4 kg, SpO2 99 %. Physical Exam  General appearance: Alert, cooperative, in no acute distress Head: Normocephalic, atraumatic Eyes: Normal, EOM intact Oropharynx: Wearing facemask CV: Regular rate and rhythm Pulm: Clear to auscultation Neck: Supple, mild tenderness palpation over the midline  posteriorly Ext: No edema in LE bilaterally  Neurologic exam:  Mental status: alertness: alert, affect: normal Speech: fluent and clear Motor:strength symmetric out of 5 in bilateral grip, interossei, bicep, tricep, deltoid. She is 5/5 strength in bilateral lower extremities including hip flexion, knee extension, dorsiflexion, plantarflexion Sensory: intact to light touch in all extremities with exception of toes on bilateral feet Reflexes: 2+ and symmetric bilaterally for biceps and patella Gait: Slightly wide-based gait  Imaging: MRI cervical spine: There is some straightening of lordotic curvature. There is multilevel degenerative disease noted. At C3-4, there is a large disc protrusion noted that impacts the spinal cord and causes severe canal stenosis. At C4-5 through C6-7 there is more mild to moderate central canal stenosis due to a congenitally small canal and disc osteophyte complexes. There is no obvious listhesis.  Assessment/Plan Proceed with C3/4 ACDF  Deetta Perla, MD 11/15/2020, 8:54 AM

## 2020-11-15 NOTE — Consult Note (Signed)
PHARMACY -  BRIEF ANTIBIOTIC NOTE   Pharmacy has received consult(s) for Cefazolin from neurosurgery.  The patient's profile has been reviewed for ht/wt/allergies/indication/available labs.    One time order(s) placed for:  - Ancef 2g x1 (on-call to OR)  Further antibiotics/pharmacy consults should be ordered by admitting physician if indicated.                       Thank you, Lorna Dibble 11/15/2020  8:34 AM

## 2020-11-15 NOTE — Interval H&P Note (Signed)
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  The various methods of treatment have been discussed with the patient and family. After consideration of risks, benefits and other options for treatment, the patient has consented to  Procedure(s): ANTERIOR CERVICAL DECOMPRESSION/DISCECTOMY FUSION 1 LEVEL C3/4 (N/A) as a surgical intervention.  The patient's history has been reviewed, patient examined, no change in status, stable for surgery.  I have reviewed the patient's chart and labs.  Questions were answered to the patient's satisfaction.     Deetta Perla

## 2020-11-15 NOTE — Anesthesia Postprocedure Evaluation (Signed)
Anesthesia Post Note  Patient: Katie Buckley  Procedure(s) Performed: ANTERIOR CERVICAL DECOMPRESSION/DISCECTOMY FUSION 1 LEVEL C3/4 (N/A )  Patient location during evaluation: PACU Anesthesia Type: General Level of consciousness: awake and alert Pain management: pain level controlled Vital Signs Assessment: post-procedure vital signs reviewed and stable Respiratory status: spontaneous breathing and respiratory function stable Cardiovascular status: stable Anesthetic complications: no   No complications documented.   Last Vitals:  Vitals:   11/15/20 1226 11/15/20 1230  BP: (!) 185/69 (!) 169/85  Pulse: 93 90  Resp: 18 (!) 23  Temp: (!) 36.2 C   SpO2: 100% 100%    Last Pain:  Vitals:   11/15/20 1230  TempSrc:   PainSc: Asleep                 Jaycee Pelzer K

## 2020-11-15 NOTE — Transfer of Care (Signed)
Immediate Anesthesia Transfer of Care Note  Patient: Katie Buckley  Procedure(s) Performed: ANTERIOR CERVICAL DECOMPRESSION/DISCECTOMY FUSION 1 LEVEL C3/4 (N/A )  Patient Location: PACU  Anesthesia Type:General  Level of Consciousness: drowsy  Airway & Oxygen Therapy: Patient connected to face mask oxygen  Post-op Assessment: Report given to RN and Post -op Vital signs reviewed and stable  Post vital signs: Reviewed and stable  Last Vitals:  Vitals Value Taken Time  BP 169/85 11/15/20 1230  Temp 36.2 C 11/15/20 1226  Pulse 90 11/15/20 1233  Resp 19 11/15/20 1233  SpO2 100 % 11/15/20 1233  Vitals shown include unvalidated device data.  Last Pain:  Vitals:   11/15/20 0845  TempSrc: Temporal  PainSc: 7          Complications: No complications documented.

## 2020-11-15 NOTE — Anesthesia Procedure Notes (Signed)
Procedure Name: Intubation Date/Time: 11/15/2020 9:57 AM Performed by: Aline Brochure, CRNA Pre-anesthesia Checklist: Patient identified, Emergency Drugs available, Suction available and Patient being monitored Patient Re-evaluated:Patient Re-evaluated prior to induction Oxygen Delivery Method: Circle system utilized Preoxygenation: Pre-oxygenation with 100% oxygen Induction Type: IV induction Ventilation: Mask ventilation without difficulty Laryngoscope Size: McGraph and 3 Grade View: Grade I Tube type: Oral Tube size: 7.0 mm Number of attempts: 1 Airway Equipment and Method: Stylet and Video-laryngoscopy Placement Confirmation: ETT inserted through vocal cords under direct vision,  positive ETCO2 and breath sounds checked- equal and bilateral Secured at: 21 cm Tube secured with: Tape Dental Injury: Teeth and Oropharynx as per pre-operative assessment  Difficulty Due To: Difficult Airway- due to anterior larynx and Difficult Airway- due to reduced neck mobility

## 2020-11-15 NOTE — Anesthesia Preprocedure Evaluation (Signed)
Anesthesia Evaluation  Patient identified by MRN, date of birth, ID band Patient awake    Reviewed: Allergy & Precautions, NPO status , Patient's Chart, lab work & pertinent test results  History of Anesthesia Complications Negative for: history of anesthetic complications  Airway Mallampati: III  TM Distance: >3 FB Neck ROM: Full    Dental no notable dental hx.    Pulmonary asthma , neg sleep apnea, neg COPD,    breath sounds clear to auscultation- rhonchi (-) wheezing      Cardiovascular hypertension, Pt. on medications (-) CAD, (-) Past MI, (-) Cardiac Stents and (-) CABG  Rhythm:Regular Rate:Normal - Systolic murmurs and - Diastolic murmurs    Neuro/Psych  Headaches, neg Seizures PSYCHIATRIC DISORDERS Anxiety Depression    GI/Hepatic Neg liver ROS, GERD  ,  Endo/Other  diabetes, Oral Hypoglycemic Agents  Renal/GU negative Renal ROS     Musculoskeletal negative musculoskeletal ROS (+)   Abdominal (+) - obese,   Peds  Hematology  (+) anemia ,   Anesthesia Other Findings Past Medical History: No date: Anxiety No date: Aortic stenosis, mild     Comment:  a.) mean gradient 18.7 mmHg on 10/2020 TTE No date: Depression No date: Frequent sinus infections 04/21/2015: GERD (gastroesophageal reflux disease) No date: H/O tension headache No date: Heart murmur No date: History of Barrett's esophagus No date: Hyperchloremia No date: Hypertension 2018: MRSA (methicillin resistant staph aureus) culture positive     Comment:  history of, in hand No date: Neuropathy No date: Palpitations No date: Seasonal allergies No date: Skin cancer     Comment:  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 No date: T2DM (type 2 diabetes mellitus) ([place])   Reproductive/Obstetrics                             Anesthesia Physical Anesthesia Plan  ASA: III  Anesthesia Plan: General   Post-op Pain Management:     Induction: Intravenous  PONV Risk Score and Plan: 2 and Ondansetron and Dexamethasone  Airway Management Planned: Oral ETT  Additional Equipment:   Intra-op Plan:   Post-operative Plan: Extubation in OR  Informed Consent: I have reviewed the patients History and Physical, chart, labs and discussed the procedure including the risks, benefits and alternatives for the proposed anesthesia with the patient or authorized representative who has indicated his/her understanding and acceptance.     Dental advisory given  Plan Discussed with: CRNA and Anesthesiologist  Anesthesia Plan Comments:         Anesthesia Quick Evaluation

## 2020-11-15 NOTE — Op Note (Signed)
Operative Note   SURGERY DATE:  11/15/2020   PRE-OP DIAGNOSIS: Cervical Spondylotic Myelopathy   POST-OP DIAGNOSIS: Post-Op Diagnosis Codes: Cervical Spondylotic Myelopathy   Procedure(s) with comments: C3/4 Anterior Cervical Discectomy and Fusion   SURGEON:     * Malen Gauze, MD       Liliane Bade,  PA Assistant   ANESTHESIA: General    OPERATIVE FINDINGS: Stenosis at C3/4   Procedure Indications Katie Buckley presented to our clinic on 1/4 with worsening balance and weakness in her upper extremities.  She had an MRI which showed severe stenosis at C3-4 and moderate stenosis in the remainder of the mid cervical spine. Given this, we recommended an anterior cervical decompression and fusion to relieve the pressure on the nerves at C3-4. The risks of hematoma, infection, poor bone healing and failure of fusion, cord injury, weakness, numbness, neck pain, stroke, and death were discussed in detail. All questions were answered and the patient elected to proceed with the surgery.     Procedure After obtaining informed consent, the patient was taken to the Operating Room where general anesthesia was induced and the patient intubated. Vascular access was obtained. Decadron was administered. The head was slightly extended and imaging used to identify a skin crease overlying the C4 vertebral body.     The patient was prepped and draped in the usual sterile fashion and a timeout was performed per protocol. Local anesthesia was instilled with epinephrine along the planned incision site. A transverse cervical incision was performed on the left in a skin crease. The incision was carried to the level of the platysma and then cautery was used to incise the muscle. Blunt dissection was used to expand the plane and the dissection was carried deep medial to the SCM and carotid sheath being careful to identify the trachea and esophagus medially. The prevertebral fascia was identified and this was bluntly  dissected to expose the disc spaces. A needle was placed in the disc space and x-ray confirmed the C3/4 disc level.     Next, cautery was used to undermine the longus colli muscles bilaterally and identify the C3-4 disc space. Caspar pins were placed at C3 and C4 and a retractor system placed under the muscles to complete the exposure. Next, a combination of curettes and Kerrison rongeurs were used to remove the anterior osteophyte at C3-4 and then the disc material. A 75mm matchstick was used to shave the endplates of the adjacent bodies. A trial spacer was used to size the graft and then hemostasis obtained. The microscope was brought into the field for the remainder of the surgery. The drill was used to remove the osteophyte/disc complex deep to the level of the PLL at C3-4. There was significant disc protrusion at this level that was removed with rongeurs. The PLL was entered with a hook and then the PLL was removed along with remaining disc material to decompress centrally and then out into bilateral neuro foramen. A blunt probe was used to confirm no residual stenosis laterally and a curette used to ensure no posterior osteophyte remained. A combination of Floseal and gelfoam was used for hemostasis. Allograft was placed, 41mm in height and placed slightly recessed to the anterior edge of vertebral body to promote arthrodesis.   X-ray confirmed good placement of graft.  The caspar pins were removed and bone wax placed. The remainder of the osteophytes were drilled to allow for plating. Next, a 2mm plate was found to be the adequate size  and was placed in the midline and secured with two 37mm screws at each bone level. Xray was obtained confirming good graft placement and adequate depth of screws. The retractors were removed. The wound was irrigated copiously and hemostasis obtained. The platysma was closed with 2-0 vicryl suture. The dermis was closed with 3-0 Vicryl and Dermabond was placed on the skin.     The patient had general anesthesia reversed and was extubated following the procedure.  She awoke following commands with symmetric movement.  She was taken to the PACU where she continued recovery and then the ward.     ESTIMATED BLOOD LOSS:   20 cc   SPECIMENS None   IMPLANT ALLOGRAFT LORDOTIC [id number] - [id number]  Inventory Item: ALLOGRAFT LORDOTIC [id number] Serial no.: [id number] Model/Cat no.: [id number]  Implant name: Gertha Calkin LORDOTIC [id number] - [id number] Laterality: N/A Area: Spine Cervical  Manufacturer: [employer] Date of Manufacture:    Action: Implanted Number Used: 1   Device Identifier:  Device Identifier Type:     PLATE ACP 1-LEVEL 3.3X83 - [id number]  Inventory Item: PLATE ACP 1-LEVEL 0.0K59 Serial no.:  Model/Cat no.: [id number]  Implant name: PLATE ACP 1-LEVEL 9.5V20 - [id number] Laterality: N/A Area: Spine Cervical  Manufacturer: [employer] Date of Manufacture:    Action: Implanted Number Used: 1   Device Identifier:  Device Identifier Type:     SCREW ACP 3.5 X 13 S/D VARIA - [id number]  Inventory Item: SCREW ACP 3.5 X 13 S/D VARIA Serial no.:  Model/Cat no.: [id number]  Implant name: SCREW ACP 3.5 X 13 S/D VARIA - [id number] Laterality: N/A Area: Spine Cervical  Manufacturer: [employer] Date of Manufacture:    Action: Implanted Number Used: 4   Device Identifier:  Device Identifier Type:        I performed the case in its entirety with assistance of PA, [facility]   Deetta Perla, [place]

## 2020-11-15 NOTE — Anesthesia Procedure Notes (Signed)
Arterial Line Insertion Start/End6/11/2020 10:15 AM, 11/15/2020 10:26 AM Performed by: Gunnar Fusi, MD, Aline Brochure, CRNA, CRNA  Patient location: OR. Preanesthetic checklist: patient identified, IV checked, site marked, risks and benefits discussed, surgical consent, monitors and equipment checked, pre-op evaluation, timeout performed and anesthesia consent Patient sedated radial was placed Catheter size: 20 G  Attempts: 3 Procedure performed without using ultrasound guided technique. Following insertion, dressing applied and Biopatch. Post procedure assessment: normal  Post procedure complications: unsuccessful attempts and second provider assisted. Patient tolerated the procedure well with no immediate complications.

## 2020-11-16 ENCOUNTER — Encounter: Payer: Self-pay | Admitting: Neurosurgery

## 2020-11-16 DIAGNOSIS — I10 Essential (primary) hypertension: Secondary | ICD-10-CM | POA: Diagnosis present

## 2020-11-16 DIAGNOSIS — Z9049 Acquired absence of other specified parts of digestive tract: Secondary | ICD-10-CM | POA: Diagnosis not present

## 2020-11-16 DIAGNOSIS — Z801 Family history of malignant neoplasm of trachea, bronchus and lung: Secondary | ICD-10-CM | POA: Diagnosis not present

## 2020-11-16 DIAGNOSIS — Z8614 Personal history of Methicillin resistant Staphylococcus aureus infection: Secondary | ICD-10-CM | POA: Diagnosis not present

## 2020-11-16 DIAGNOSIS — Z888 Allergy status to other drugs, medicaments and biological substances status: Secondary | ICD-10-CM | POA: Diagnosis not present

## 2020-11-16 DIAGNOSIS — K219 Gastro-esophageal reflux disease without esophagitis: Secondary | ICD-10-CM | POA: Diagnosis present

## 2020-11-16 DIAGNOSIS — Z881 Allergy status to other antibiotic agents status: Secondary | ICD-10-CM | POA: Diagnosis not present

## 2020-11-16 DIAGNOSIS — Z9882 Breast implant status: Secondary | ICD-10-CM | POA: Diagnosis not present

## 2020-11-16 DIAGNOSIS — I350 Nonrheumatic aortic (valve) stenosis: Secondary | ICD-10-CM | POA: Diagnosis present

## 2020-11-16 DIAGNOSIS — M4712 Other spondylosis with myelopathy, cervical region: Secondary | ICD-10-CM | POA: Diagnosis present

## 2020-11-16 DIAGNOSIS — M4802 Spinal stenosis, cervical region: Secondary | ICD-10-CM | POA: Diagnosis present

## 2020-11-16 DIAGNOSIS — K2270 Barrett's esophagus without dysplasia: Secondary | ICD-10-CM | POA: Diagnosis present

## 2020-11-16 DIAGNOSIS — Z833 Family history of diabetes mellitus: Secondary | ICD-10-CM | POA: Diagnosis not present

## 2020-11-16 DIAGNOSIS — Z79899 Other long term (current) drug therapy: Secondary | ICD-10-CM | POA: Diagnosis not present

## 2020-11-16 DIAGNOSIS — M2578 Osteophyte, vertebrae: Secondary | ICD-10-CM | POA: Diagnosis present

## 2020-11-16 DIAGNOSIS — F419 Anxiety disorder, unspecified: Secondary | ICD-10-CM | POA: Diagnosis present

## 2020-11-16 DIAGNOSIS — E119 Type 2 diabetes mellitus without complications: Secondary | ICD-10-CM | POA: Diagnosis present

## 2020-11-16 DIAGNOSIS — Z7984 Long term (current) use of oral hypoglycemic drugs: Secondary | ICD-10-CM | POA: Diagnosis not present

## 2020-11-16 DIAGNOSIS — Z85828 Personal history of other malignant neoplasm of skin: Secondary | ICD-10-CM | POA: Diagnosis not present

## 2020-11-16 DIAGNOSIS — F32A Depression, unspecified: Secondary | ICD-10-CM | POA: Diagnosis present

## 2020-11-16 DIAGNOSIS — Z803 Family history of malignant neoplasm of breast: Secondary | ICD-10-CM | POA: Diagnosis not present

## 2020-11-16 DIAGNOSIS — Z800 Family history of malignant neoplasm of digestive organs: Secondary | ICD-10-CM | POA: Diagnosis not present

## 2020-11-16 DIAGNOSIS — J302 Other seasonal allergic rhinitis: Secondary | ICD-10-CM | POA: Diagnosis present

## 2020-11-16 LAB — GLUCOSE, CAPILLARY
Glucose-Capillary: 134 mg/dL — ABNORMAL HIGH (ref 70–99)
Glucose-Capillary: 314 mg/dL — ABNORMAL HIGH (ref 70–99)

## 2020-11-16 MED ORDER — OXYCODONE HCL 5 MG PO TABS: 5.0000 mg | ORAL_TABLET | Freq: Four times a day (QID) | ORAL | 0 refills | Status: DC | PRN

## 2020-11-16 MED ORDER — SENNA 8.6 MG PO TABS: 1.0000 | ORAL_TABLET | Freq: Two times a day (BID) | ORAL | 0 refills | Status: DC

## 2020-11-16 MED ORDER — METHOCARBAMOL 500 MG PO TABS: 500.0000 mg | ORAL_TABLET | Freq: Four times a day (QID) | ORAL | 0 refills | Status: DC | PRN

## 2020-11-16 NOTE — Progress Notes (Signed)
PT Cancellation Note  Patient Details Name: Katie Buckley MRN: [id number] DOB: May 08, 1951   Cancelled Treatment:    Reason Eval/Treat Not Completed: Medical issues which prohibited therapy (BP remains elevated per chart review, most recently 190s mmHg SBP. WIll deferred evaluation to later date/time until pt is medically appropriate.)  9:03 AM, 11/16/20 Etta Grandchild, PT, DPT Physical Therapist - [facility]  [phone] ([place])    [place] C 11/16/2020, 9:03 AM

## 2020-11-16 NOTE — Plan of Care (Signed)

## 2020-11-16 NOTE — Discharge Summary (Signed)
Physician Discharge Summary  Patient ID: Katie Buckley MRN: [id number] DOB/AGE: [birth year] 70 y.o.  Admit date: 11/15/2020 Discharge date: 11/16/2020  Admission Diagnoses: Cervical Myelopathy  Discharge Diagnoses:  Active Problems:   Cervical myelopathy [facility])   Discharged Condition: good  Hospital Course: Katie Buckley was admitted for C3/4 ACDF on 6/6 and the surgery was uneventful. She was seen to be at neurologic baseline post-operatively. She was having expected neck paina nd sore throat but was tolerating liquids. She had xrays performed. On POD#1, she worked with Pt and OT and her pain was being controlled with oral medications. Given this, she was appropriate for discharge home.   Consults: PT/OT  Discharge Exam: Blood pressure (!) 152/54, pulse 75, temperature 98.1 F (36.7 C), temperature source Oral, resp. rate 16, height 5' (1.524 m), weight 64.4 kg, SpO2 95 %. General appearance: Alert, cooperative, in no acute distress Neck: Soft, flat, incision well approximate  Neurologic exam:  Mental status: alertness: alert, affect: normal Speech: fluent and clear Motor:strength symmetric 5 out of 5 in bilateral grip, interossei, bicep, tricep, deltoid. She is 5/5 strength in bilateral lower extremities including hip flexion, knee extension, dorsiflexion, plantarflexion Sensory: intact to light touch in all extremities with exception of toes on bilateral feet  Disposition:   Discharge Instructions    Incentive spirometry RT   Complete by: As directed      Allergies as of 11/16/2020      Reactions   Doxycycline Other (See Comments)   Emotional changes/anxious   Biaxin [clarithromycin]    GI upset, and bad taste in mouth.      Medication List    STOP taking these medications   meloxicam 15 MG tablet Commonly known as: MOBIC     TAKE these medications   azelastine 0.1 % nasal spray Commonly known as: ASTELIN Place 1-2 sprays into both nostrils every 12 (twelve) hours  as needed.   buPROPion 100 MG 12 hr tablet Commonly known as: WELLBUTRIN SR Take 100 mg by mouth 2 (two) times daily.   butalbital-acetaminophen-caffeine 50-325-40 MG tablet Commonly known as: FIORICET TAKE 1 TABLET BY MOUTH EVERY 6 HOURS AS NEEDED FOR HEADACHE. What changed: See the new instructions.   empagliflozin 25 MG Tabs tablet Commonly known as: Jardiance Take 1 tablet (25 mg total) by mouth daily.   Esomeprazole Magnesium 20 MG Tbec Take 20 mg by mouth daily before breakfast.   ezetimibe 10 MG tablet Commonly known as: ZETIA TAKE 1 TABLET BY MOUTH EVERYDAY AT BEDTIME What changed: See the new instructions.   fexofenadine 180 MG tablet Commonly known as: ALLEGRA Take 180 mg by mouth daily as needed for allergies or rhinitis.   gabapentin 300 MG capsule Commonly known as: NEURONTIN TAKE 2 CAPSULES (600 MG TOTAL) BY MOUTH 2 (TWO) TIMES DAILY. MORNING & SUPPER What changed: additional instructions   glipiZIDE 10 MG tablet Commonly known as: GLUCOTROL TAKE 1 TABLET BY MOUTH DAILY BEFORE BREAKFAST. What changed: when to take this   hydrOXYzine 25 MG capsule Commonly known as: VISTARIL Take 25 mg by mouth daily as needed for anxiety.   irbesartan 75 MG tablet Commonly known as: Avapro Take 1 tablet (75 mg total) by mouth daily. What changed: when to take this   Lancet Devices Misc   metFORMIN 500 MG 24 hr tablet Commonly known as: GLUCOPHAGE-XR Take 2 tablets (1,000 mg total) by mouth daily. What changed: how much to take   methocarbamol 500 MG tablet Commonly known as: ROBAXIN Take  1 tablet (500 mg total) by mouth every 6 (six) hours as needed for muscle spasms.   metoprolol-hydrochlorothiazide 100-25 MG tablet Commonly known as: LOPRESSOR HCT Take 0.5 tablets by mouth daily.   montelukast 10 MG tablet Commonly known as: SINGULAIR TAKE 1 TABLET BY MOUTH EVERY DAY What changed:   when to take this  reasons to take this   Olopatadine HCl 0.7 %  Soln Apply 1 drop to eye daily as needed.   ondansetron 4 MG disintegrating tablet Commonly known as: Zofran ODT Take 1 tablet (4 mg total) by mouth every 8 (eight) hours as needed for nausea or vomiting.   ondansetron 4 MG tablet Commonly known as: ZOFRAN Take 1 tablet (4 mg total) by mouth every 8 (eight) hours as needed.   oxyCODONE 5 MG immediate release tablet Commonly known as: Oxy IR/ROXICODONE Take 1 tablet (5 mg total) by mouth every 6 (six) hours as needed for moderate pain ((score 4 to 6)).   rosuvastatin 20 MG tablet Commonly known as: CRESTOR TAKE 1 TABLET BY MOUTH EVERY DAY What changed: when to take this   senna 8.6 MG Tabs tablet Commonly known as: SENOKOT Take 1 tablet (8.6 mg total) by mouth 2 (two) times daily.   sertraline 100 MG tablet Commonly known as: ZOLOFT Take 200 mg by mouth daily.   traZODone 50 MG tablet Commonly known as: DESYREL Take 50-100 mg by mouth at bedtime as needed for sleep.   valACYclovir 1000 MG tablet Commonly known as: Valtrex Take 1 tablet daily for 5 days as needed        Signed: Deetta Perla 11/16/2020, 12:48 PM

## 2020-11-16 NOTE — Discharge Instructions (Signed)
NEUROSURGERY DISCHARGE INSTRUCTIONS  Admission diagnosis: Cervical myelopathy ([place]) [G95.9]  Operative procedure: C3/4 ACDF  What to do after you leave the hospital:  Recommended diet: regular diet. Increase protein intake to promote wound healing.  Recommended activity: no lifting or strenuous exercise for 6 weeks. You should walk multiple times per day  Special Instructions  No straining, no heavy lifting > 10lbs x 6 weeks.  Keep incision area clean and dry. May shower in 2 days. No baths or pools for 6 weeks.   You have no sutures to remove, the skin is closed with adhesive  Please take pain medications as directed. Take a stool softener if on pain medications   Please Report any of the following: Nausea or Vomiting, Temperature is greater than 101.66F (38.1C) degrees, Dizziness, Abdominal Pain, Difficulty Breathing or Shortness of Breath, Inability to Eat, drink Fluids, or Take medications, Bleeding, swelling, or drainage from surgical incision sites, New numbness or weakness, and Bowel or bladder dysfunction to the neurosurgeon on call at [phone]  Additional Follow up appointments Please follow up with Dr Lacinda Axon in [place] clinic as scheduled in 2-3 weeks   Please see below for scheduled appointments:  Future Appointments  Date Time Provider [place]  01/31/2021 11:00 AM [name], MD [facility]

## 2020-11-16 NOTE — Evaluation (Addendum)
Physical Therapy Evaluation Patient Details Name: Katie Buckley MRN: [id number] DOB: [birth year] Today's Date: 11/16/2020   History of Present Illness  Katie Buckley is a 23yoF who comes to [facility] for elective ACDF C3/4 2/2 cervical myelopathy. PMH: GAD, depression, GERD, tension HA, HTN, DM2, BLE polyuneuropathy, LBP c leg pain referral, frequent falls. Pt reports increased imbalance and weakness of BUE for past 18 months. Pt underwent procedure with Dr. Deetta Perla 6/6.  Clinical Impression  Pt admitted with above diagnosis. Pt currently with functional limitations due to the deficits listed below (see "PT Problem List"). Upon entry, pt in bed, awake and agreeable to participate. The pt is alert, pleasant, interactive, and able to provide info regarding prior level of function, both in tolerance and independence. Pt performs all mobility with modified independence or better. No gait abnormality that would indicate myelopathic ataxia. No LOB during session. Pt denies any acute impairment of mobility. Pt encouraged to complete the rehab process for ACDF, then ask her PCP or Neurologist Manuella Ghazi) for referral to PT for more extensive assessment of falls risk.  Pt will benefit from skilled PT intervention to increase independence and safety with basic mobility in preparation for discharge to the venue listed below.       Follow Up Recommendations Follow surgeon's recommendation for DC plan and follow-up therapies;Supervision - Intermittent    Equipment Recommendations  None recommended by PT    Recommendations for Other Services       Precautions / Restrictions Precautions Precautions: None Restrictions Weight Bearing Restrictions: No      Mobility  Bed Mobility Overal bed mobility: Modified Independent             General bed mobility comments: pain inhibition    Transfers Overall transfer level: Modified independent Equipment used: None             General transfer  comment: no physical assist and no LOB during session  Ambulation/Gait   Gait Distance (Feet): 320 Feet Assistive device: None Gait Pattern/deviations: WFL(Within Functional Limits) Gait velocity: 1.83m/s      Stairs Stairs: Yes Stairs assistance: Supervision Stair Management: One rail Right Number of Stairs: 4    Wheelchair Mobility    Modified Rankin (Stroke Patients Only)       Balance Overall balance assessment: Modified Independent                                           Pertinent Vitals/Pain Pain Assessment: 0-10 Pain Score: 7  Faces Pain Scale: Hurts a little bit Pain Location: anterior neck, posterior neck Pain Descriptors / Indicators: Discomfort Pain Intervention(s): Limited activity within patient's tolerance    Home Living Family/patient expects to be discharged to:: Private residence Living Arrangements: Spouse/significant other Available Help at Discharge: Available 24 hours/day Type of Home: Apartment Home Access: Stairs to enter Entrance Stairs-Rails: Psychiatric nurse of Steps: 3 Home Layout: One level Home Equipment: Cane - single point      Prior Function Level of Independence: Needs assistance   Gait / Transfers Assistance Needed: Community AMB, no device.  ADL's / Homemaking Assistance Needed: independent  Comments: I with all aspects of self care, IADLs, and drives. Very active.     Hand Dominance   Dominant Hand: Right    Extremity/Trunk Assessment   Upper Extremity Assessment Upper Extremity Assessment: Overall WFL for tasks assessed  Lower Extremity Assessment Lower Extremity Assessment: Overall WFL for tasks assessed    Cervical / Trunk Assessment Cervical / Trunk Assessment: Normal  Communication   Communication: No difficulties  Cognition Arousal/Alertness: Awake/alert Behavior During Therapy: WFL for tasks assessed/performed Overall Cognitive Status: Within Functional  Limits for tasks assessed                                        General Comments      Exercises     Assessment/Plan    PT Assessment Patient needs continued PT services  PT Problem List Decreased activity tolerance;Decreased mobility       PT Treatment Interventions Functional mobility training;Balance training;DME instruction;Gait training;Stair training;Therapeutic activities;Therapeutic exercise;Patient/family education;Neuromuscular re-education    PT Goals (Current goals can be found in the Care Plan section)  Acute Rehab PT Goals Patient Stated Goal: to go home PT Goal Formulation: With patient Time For Goal Achievement: 11/30/20 Potential to Achieve Goals: Good    Frequency Min 2X/week   Barriers to discharge        Co-evaluation               AM-PAC PT "6 Clicks" Mobility  Outcome Measure Help needed turning from your back to your side while in a flat bed without using bedrails?: None Help needed moving from lying on your back to sitting on the side of a flat bed without using bedrails?: None Help needed moving to and from a bed to a chair (including a wheelchair)?: None Help needed standing up from a chair using your arms (e.g., wheelchair or bedside chair)?: None Help needed to walk in hospital room?: None Help needed climbing 3-5 steps with a railing? : None 6 Click Score: 24    End of Session Equipment Utilized During Treatment: Gait belt Activity Tolerance: Patient tolerated treatment well;No increased pain Patient left: Other (comment) (in hallway, pt talking to RN at desk.) Nurse Communication: Mobility status PT Visit Diagnosis: History of falling (Z91.81);Other abnormalities of gait and mobility (R26.89)    Time: 7096-2836 PT Time Calculation (min) (ACUTE ONLY): 20 min   Charges:   PT Evaluation $PT Eval Moderate Complexity: 1 Mod         1:46 PM, 11/16/20 Etta Grandchild, PT, DPT Physical Therapist - [facility]  [phone] ([place])    [place] C 11/16/2020, 1:46 PM

## 2020-11-16 NOTE — Evaluation (Signed)
Occupational Therapy Evaluation Patient Details Name: Katie Buckley MRN: [id number] DOB: 19-Apr-1951 Today's Date: 11/16/2020    History of Present Illness Pt is  70 y/o female s/p C3/4 Anterior Cervical Discectomy and Fusion.   Clinical Impression   Upon entering the room, pt seated on EOB attempting to get up for urgent toileting needs. OT provided supervision progressing to mod I for pt to ambulate without use of AD to bathroom and perform toileting, hygiene, and clothing management without LOB. Pt ambulating into room and opening blinds, picking up items from floor, and straightening up bed without issue. She lives with her husband in an apartment and he is available at discharge. She does not endorse any falls in the past 6 months. She was independent at baseline without use of AD. BP with mobility was 146/53. Pt does not need skilled OT intervention at this time based on her occupational performance this session. OT will SIGN OFF. Thank you for this referral.    Follow Up Recommendations  No OT follow up    Equipment Recommendations  None recommended by OT       Precautions / Restrictions Precautions Precautions: Fall      Mobility Bed Mobility Overal bed mobility: Independent             General bed mobility comments: no assist    Transfers Overall transfer level: Independent Equipment used: None             General transfer comment: no physical assist and no LOB during session    Balance Overall balance assessment: Modified Independent                                         ADL either performed or assessed with clinical judgement   ADL Overall ADL's : Modified independent                                       General ADL Comments: increased time to complete tasks but no physical assistance needed this session.     Vision Patient Visual Report: No change from baseline              Pertinent Vitals/Pain Pain  Assessment: Faces Faces Pain Scale: Hurts a little bit Pain Location: anterior neck Pain Descriptors / Indicators: Discomfort Pain Intervention(s): Limited activity within patient's tolerance;Premedicated before session;Monitored during session     Hand Dominance Right   Extremity/Trunk Assessment Upper Extremity Assessment Upper Extremity Assessment: Overall WFL for tasks assessed   Lower Extremity Assessment Lower Extremity Assessment: Overall WFL for tasks assessed       Communication Communication Communication: No difficulties   Cognition Arousal/Alertness: Awake/alert Behavior During Therapy: WFL for tasks assessed/performed Overall Cognitive Status: Within Functional Limits for tasks assessed                                                Home Living Family/patient expects to be discharged to:: Private residence Living Arrangements: Spouse/significant other Available Help at Discharge: Available 24 hours/day Type of Home: Apartment Home Access: Stairs to enter [employer] of Steps: 3 Entrance Stairs-Rails: Right;Left Home Layout: One level  Bathroom Shower/Tub: Aeronautical engineer: Cane - single point          Prior Functioning/Environment Level of Independence: Independent        Comments: I with all aspects of self care, IADLs, and drives. Very active.                 OT Goals(Current goals can be found in the care plan section) Acute Rehab OT Goals Patient Stated Goal: to go home OT Goal Formulation: With patient Time For Goal Achievement: 11/16/20 Potential to Achieve Goals: Good  OT Frequency:      AM-PAC OT "6 Clicks" Daily Activity     Outcome Measure Help from another person eating meals?: None Help from another person taking care of personal grooming?: None Help from another person toileting, which includes using toliet, bedpan, or urinal?: None Help from another person bathing  (including washing, rinsing, drying)?: None Help from another person to put on and taking off regular upper body clothing?: None Help from another person to put on and taking off regular lower body clothing?: None 6 Click Score: 24   End of Session Nurse Communication: Mobility status  Activity Tolerance: Patient tolerated treatment well Patient left: in bed;with call bell/phone within reach                   Time: 0920-0940 OT Time Calculation (min): 20 min Charges:  OT General Charges $OT Visit: 1 Visit OT Evaluation $OT Eval Low Complexity: 1 Low OT Treatments $Self Care/Home Management : 8-22 mins  Darleen Crocker, MS, OTR/L , CBIS ascom [phone]  11/16/20, 10:51 AM

## 2020-11-19 ENCOUNTER — Telehealth: Payer: Self-pay

## 2020-11-19 NOTE — Progress Notes (Signed)
Chronic Care Management Pharmacy Assistant   Name: Katie Buckley  MRN: [id number] DOB: 01-Jun-1951  Reason for Encounter:Diabetes and Hypertension Disease State Call.   Recent office visits:  10/27/2020 Dr.Fisher MD (PCP) Increase from 10 mg to empagliflozin (JARDIANCE) 25 MG TABS tablet; Take 1 tablet (25 mg total) by mouth daily and increase from 500mg  daily to metFORMIN (GLUCOPHAGE-XR) 500 MG 24 hr tablet; Take 2 tablets (1,000 mg total) by mouth daily.Stop Ozempic due to side effect. 10/19/2020 Dr.Fisher MD (PCP) started Irbesartan 75 mg one tablet daily 09/01/2020 Dr.Fisher MD (PCP)   Recent consult visits:  10/20/2020 Gayland Curry (Neurology)   Hospital visits:  Medication Reconciliation was completed by comparing discharge summary, patient's EMR and Pharmacy list, and upon discussion with patient.  Admitted to the hospital on 11/15/2020 due to Surgery. Discharge date was 11/16/2020. Discharged from [place]?Medications Started at [facility] Discharge:?? -started None   Medication Changes at Hospital Discharge: -Changed None   Medications Discontinued at Hospital Discharge: -Stopped Meloxicam  Medications that remain the same after Hospital Discharge:??  -All other medications will remain the same.    Admitted to the hospital on 09/29/2020 due to Hyperglycemia. Discharge date was 09/29/2020. Discharged from [place]?Medications Started at [facility] Discharge:?? -started none  Medication Changes at Hospital Discharge: -Changed none  Medications Discontinued at Hospital Discharge: -Stopped none   Medications that remain the same after Hospital Discharge:??  -All other medications will remain the same.    Medications: Outpatient Encounter Medications as of 11/19/2020  Medication Sig   azelastine (ASTELIN) 0.1 % nasal spray Place 1-2 sprays into both nostrils every 12 (twelve) hours as needed.   buPROPion  (WELLBUTRIN SR) 100 MG 12 hr tablet Take 100 mg by mouth 2 (two) times daily.   butalbital-acetaminophen-caffeine (FIORICET) 50-325-40 MG tablet TAKE 1 TABLET BY MOUTH EVERY 6 HOURS AS NEEDED FOR HEADACHE. (Patient taking differently: Take 1 tablet by mouth every 6 (six) hours as needed for headache.)   empagliflozin (JARDIANCE) 25 MG TABS tablet Take 1 tablet (25 mg total) by mouth daily.   Esomeprazole Magnesium 20 MG TBEC Take 20 mg by mouth daily before breakfast.   ezetimibe (ZETIA) 10 MG tablet TAKE 1 TABLET BY MOUTH EVERYDAY AT BEDTIME (Patient taking differently: Take 10 mg by mouth at bedtime.)   fexofenadine (ALLEGRA) 180 MG tablet Take 180 mg by mouth daily as needed for allergies or rhinitis.   gabapentin (NEURONTIN) 300 MG capsule TAKE 2 CAPSULES (600 MG TOTAL) BY MOUTH 2 (TWO) TIMES DAILY. MORNING & SUPPER (Patient taking differently: Take 600 mg by mouth 2 (two) times daily.)   glipiZIDE (GLUCOTROL) 10 MG tablet TAKE 1 TABLET BY MOUTH DAILY BEFORE BREAKFAST. (Patient taking differently: Take 10 mg by mouth in the morning and at bedtime.)   hydrOXYzine (VISTARIL) 25 MG capsule Take 25 mg by mouth daily as needed for anxiety.   irbesartan (AVAPRO) 75 MG tablet Take 1 tablet (75 mg total) by mouth daily. (Patient taking differently: Take 75 mg by mouth in the morning and at bedtime.)   Lancet Devices MISC    metFORMIN (GLUCOPHAGE-XR) 500 MG 24 hr tablet Take 2 tablets (1,000 mg total) by mouth daily. (Patient taking differently: Take 500 mg by mouth daily.)   methocarbamol (ROBAXIN) 500 MG tablet Take 1 tablet (500 mg total) by mouth every 6 (six) hours as needed for muscle spasms.   metoprolol-hydrochlorothiazide (LOPRESSOR HCT) 100-25 MG tablet  Take 0.5 tablets by mouth daily.   montelukast (SINGULAIR) 10 MG tablet TAKE 1 TABLET BY MOUTH EVERY DAY (Patient taking differently: Take 10 mg by mouth daily as needed (allergies).)   Olopatadine HCl 0.7 % SOLN Apply 1 drop to eye daily as  needed.   ondansetron (ZOFRAN ODT) 4 MG disintegrating tablet Take 1 tablet (4 mg total) by mouth every 8 (eight) hours as needed for nausea or vomiting.   ondansetron (ZOFRAN) 4 MG tablet Take 1 tablet (4 mg total) by mouth every 8 (eight) hours as needed.   oxyCODONE (OXY IR/ROXICODONE) 5 MG immediate release tablet Take 1 tablet (5 mg total) by mouth every 6 (six) hours as needed for moderate pain ((score 4 to 6)).   rosuvastatin (CRESTOR) 20 MG tablet TAKE 1 TABLET BY MOUTH EVERY DAY (Patient taking differently: Take 20 mg by mouth at bedtime.)   senna (SENOKOT) 8.6 MG TABS tablet Take 1 tablet (8.6 mg total) by mouth 2 (two) times daily.   sertraline (ZOLOFT) 100 MG tablet Take 200 mg by mouth daily.   traZODone (DESYREL) 50 MG tablet Take 50-100 mg by mouth at bedtime as needed for sleep.    valACYclovir (VALTREX) 1000 MG tablet Take 1 tablet daily for 5 days as needed   No facility-administered encounter medications on file as of 11/19/2020.   Star Rating Drugs: Jardiance 25 mg last filled on 10/29/2020 for [facility] day supply [facility]. Metformin 500 mg last filled on 10/29/2020 for [facility] day supply at [facility]. Irbesartan 75 mg last filled on 10/19/2020 for [facility] day supply at [facility]. Glipizide 10 mg last filled on 09/16/2020 for [facility] day supply at [facility]. Rosuvastatin 20 mg last filled on 09/17/2020 for [facility] day supply at [facility].  Recent Relevant Labs: Lab Results  Component Value Date/Time   HGBA1C 9.9 (A) 10/27/2020 09:48 AM   HGBA1C 9.2 (H) 09/01/2020 09:07 AM   HGBA1C 9.6 (A) 11/19/2019 10:15 AM   HGBA1C 11.8 (H) 06/17/2019 03:44 PM   HGBA1C 8.5 07/13/2016 12:00 AM   HGBA1C 9.2 (H) 03/27/2013 06:21 PM    Kidney Function Lab Results  Component Value Date/Time   CREATININE 0.79 11/15/2020 09:15 AM   CREATININE 0.76 11/03/2020 09:42 AM   CREATININE 0.61 05/01/2017 08:42 AM   CREATININE 0.87 02/08/2014 05:17 PM   CREATININE 0.63 04/30/2013 05:24 AM   GFRNONAA  >60 11/15/2020 09:15 AM   GFRNONAA 95 05/01/2017 08:42 AM   GFRAA 89 11/19/2019 09:58 AM   GFRAA 110 05/01/2017 08:42 AM    Current antihyperglycemic regimen:  Jardiance 25 mg 1 tablet daily Metformin 500 mg 2 tablets daily Glipizide 10 mg 1 tablet before breakfast and 1 tablet at bedtime  What recent interventions/DTPs have been made to improve glycemic control:  10/27/2020 Dr.Fisher MD (PCP) Increase from 10 mg to empagliflozin (JARDIANCE) 25 MG TABS tablet; Take 1 tablet (25 mg total) by mouth daily and increase from 500mg  daily to metFORMIN (GLUCOPHAGE-XR) 500 MG 24 hr tablet; Take 2 tablets (1,000 mg total) by mouth daily.Stop Ozempic due to side effect. Have there been any recent hospitalizations or ED visits since last visit with CPP? Yes    Adherence Review: Is the patient currently on a STATIN medication? Yes Is the patient currently on ACE/ARB medication? Yes Does the patient have >5 day gap between last estimated fill dates? No  Reviewed chart prior to disease state call. Spoke with patient regarding BP  Recent Office Vitals: BP Readings from Last 3 Encounters:  11/16/20 (!) 137/49  11/03/20 (!) 156/65  10/27/20 (!) 181/56   Pulse Readings from Last 3 Encounters:  11/16/20 77  11/03/20 63  10/27/20 62    Wt Readings from Last 3 Encounters:  11/15/20 142 lb (64.4 kg)  11/03/20 141 lb (64 kg)  10/27/20 143 lb 6.4 oz (65 kg)     Kidney Function Lab Results  Component Value Date/Time   CREATININE 0.79 11/15/2020 09:15 AM   CREATININE 0.76 11/03/2020 09:42 AM   CREATININE 0.61 05/01/2017 08:42 AM   CREATININE 0.87 02/08/2014 05:17 PM   CREATININE 0.63 04/30/2013 05:24 AM   GFRNONAA >60 11/15/2020 09:15 AM   GFRNONAA 95 05/01/2017 08:42 AM   GFRAA 89 11/19/2019 09:58 AM   GFRAA 110 05/01/2017 08:42 AM    BMP Latest Ref Rng & Units 11/15/2020 11/03/2020 09/29/2020  Glucose 70 - 99 mg/dL 282(H) 275(H) 194(H)  BUN 8 - 23 mg/dL 17 17 23   Creatinine 0.44 - 1.00  mg/dL 0.79 0.76 0.87  BUN/Creat Ratio 12 - 28 - - -  Sodium 135 - 145 mmol/L 134(L) 135 138  Potassium 3.5 - 5.1 mmol/L 4.0 4.3 3.8  Chloride 98 - 111 mmol/L 98 97(L) 93(L)  CO2 22 - 32 mmol/L 26 27 29   Calcium 8.9 - 10.3 mg/dL 9.1 9.5 10.1    Current antihypertensive regimen:  Irbesartan 75 mg 1 tablet in the morning and 1 tablet at bedtime. Metoprolol- HCTZ 100-25 mg 0.5 tablet daily   What recent interventions/DTPs have been made by any provider to improve Blood Pressure control since last CPP Visit:  10/19/2020 Dr.Fisher MD (PCP) started Irbesartan 75 mg one tablet daily Any recent hospitalizations or ED visits since last visit with CPP? No  I have attempted without success to contact this patient by phone three times to do her Diabetes and Hypertension Disease State call. I left a Voice message for patient to return my call.  LVM 06/10, 06/16,06/20   Adherence Review: Is the patient currently on ACE/ARB medication? Yes Does the patient have >5 day gap between last estimated fill dates? No     Anderson [place] Clinical Production designer, theatre/television/film [phone]

## 2020-12-14 ENCOUNTER — Ambulatory Visit: Payer: Self-pay

## 2020-12-14 NOTE — Telephone Encounter (Signed)
You can put in her on Monday at 2:20. Also, what is her blood pressure running, if she's not checking it she needs to start.

## 2020-12-14 NOTE — Telephone Encounter (Signed)
Patient called and says she's been having dizziness that has gotten worse over the past 2 weeks. She says she's been seen for this in the past. She says she's not falling, but it feels like she will. She denies any other symptoms except feeling groggy and eye strain. I advised no availability until 01/03/21 at 0800, she says she will take that appointment and if he has anything earlier to call her. I advised I will send this to Dr. Caryn Section for review and recommendation. Care advice given, patient verbalized understanding.  Reason for Disposition  [1] MODERATE dizziness (e.g., interferes with normal activities) AND [2] has been evaluated by physician for this  Answer Assessment - Initial Assessment Questions 1. DESCRIPTION: "Describe your dizziness."     Feel off balance, I spin 2. LIGHTHEADED: "Do you feel lightheaded?" (e.g., somewhat faint, woozy, weak upon standing)     Yes 3. VERTIGO: "Do you feel like either you or the room is spinning or tilting?" (i.e. vertigo)     Yes 4. SEVERITY: "How bad is it?"  "Do you feel like you are going to faint?" "Can you stand and walk?"   - MILD: Feels slightly dizzy, but walking normally.   - MODERATE: Feels unsteady when walking, but not falling; interferes with normal activities (e.g., school, work).   - SEVERE: Unable to walk without falling, or requires assistance to walk without falling; feels like passing out now.      Moderate 5. ONSET:  "When did the dizziness begin?"     2 weeks ago really bad; ongoing dizzy spells for months 6. AGGRAVATING FACTORS: "Does anything make it worse?" (e.g., standing, change in head position)     Changing head position 7. HEART RATE: "Can you tell me your heart rate?" "How many beats in 15 seconds?"  (Note: not all patients can do this)       N/A 8. CAUSE: "What do you think is causing the dizziness?"     I don't know 9. RECURRENT SYMPTOM: "Have you had dizziness before?" If Yes, ask: "When was the last time?" "What  happened that time?"     Ongoing for months 10. OTHER SYMPTOMS: "Do you have any other symptoms?" (e.g., fever, chest pain, vomiting, diarrhea, bleeding)       Groggy and strain on eyes 11. PREGNANCY: "Is there any chance you are pregnant?" "When was your last menstrual period?"       No  Protocols used: Dizziness - Lightheadedness-A-AH

## 2020-12-15 ENCOUNTER — Other Ambulatory Visit: Payer: Self-pay | Admitting: Family Medicine

## 2020-12-15 DIAGNOSIS — Z1231 Encounter for screening mammogram for malignant neoplasm of breast: Secondary | ICD-10-CM

## 2020-12-15 NOTE — Telephone Encounter (Signed)
LMTCB 12/15/2020.  PEC please advise pt she is scheduled for 12/20/2020 at 2:20.   Thanks,   -Mickel Baas

## 2020-12-16 DIAGNOSIS — R41840 Attention and concentration deficit: Secondary | ICD-10-CM | POA: Diagnosis not present

## 2020-12-16 DIAGNOSIS — F411 Generalized anxiety disorder: Secondary | ICD-10-CM | POA: Diagnosis not present

## 2020-12-16 DIAGNOSIS — F331 Major depressive disorder, recurrent, moderate: Secondary | ICD-10-CM | POA: Diagnosis not present

## 2020-12-16 DIAGNOSIS — G4700 Insomnia, unspecified: Secondary | ICD-10-CM | POA: Diagnosis not present

## 2020-12-17 ENCOUNTER — Ambulatory Visit
Admission: RE | Admit: 2020-12-17 | Discharge: 2020-12-17 | Disposition: A | Discharge status: Home / Self Care | Payer: PPO | Source: Ambulatory Visit | Attending: Family Medicine | Admitting: Family Medicine

## 2020-12-17 ENCOUNTER — Telehealth: Payer: Self-pay

## 2020-12-17 ENCOUNTER — Other Ambulatory Visit: Payer: Self-pay | Admitting: Family Medicine

## 2020-12-17 ENCOUNTER — Other Ambulatory Visit: Payer: Self-pay

## 2020-12-17 DIAGNOSIS — E1140 Type 2 diabetes mellitus with diabetic neuropathy, unspecified: Secondary | ICD-10-CM

## 2020-12-17 DIAGNOSIS — IMO0002 Reserved for concepts with insufficient information to code with codable children: Secondary | ICD-10-CM

## 2020-12-17 DIAGNOSIS — Z1231 Encounter for screening mammogram for malignant neoplasm of breast: Secondary | ICD-10-CM

## 2020-12-17 NOTE — Telephone Encounter (Signed)
Patient called, left VM to return the call to the office to be notified of an appointment.

## 2020-12-17 NOTE — Telephone Encounter (Signed)
Future visit in 2 weeks  

## 2020-12-17 NOTE — Telephone Encounter (Signed)
Tried calling patient. Left message to call back. OK for PEC to advise patient of scheduled appointment as stated below.

## 2020-12-17 NOTE — Telephone Encounter (Signed)
Copied from [place] [phone]. Topic: Appointment Scheduling - Scheduling Inquiry for Clinic >> Dec 17, 2020  4:06 PM Celene Kras wrote: Reason for CRM: PT called in regarding the appt that was scheduled for her on 12/20/20. She declined the appt and stated that she wanted to cancel. Please advise.

## 2020-12-20 ENCOUNTER — Ambulatory Visit: Payer: PPO | Admitting: Family Medicine

## 2020-12-20 ENCOUNTER — Other Ambulatory Visit: Payer: Self-pay | Admitting: Family Medicine

## 2020-12-20 NOTE — Telephone Encounter (Signed)
Requested medications are due for refill today YES  Requested medications are on the active medication list YES  Last refill 10/13/20  Last visit 10/2020  Future visit scheduled 01/03/21  Notes to clinic Not Delegated.

## 2020-12-21 ENCOUNTER — Telehealth: Payer: Self-pay

## 2020-12-21 NOTE — Progress Notes (Signed)
Chronic Care Management Pharmacy Assistant   Name: Katie Buckley  MRN: [id number] DOB: Jul 14, 1950  Reason for Encounter: Diabetes and Hypertension  Disease State  Per CPP he has advised for me to reach out to [facility] to get the patient scheduled for an initial visit as she has not been seen or spoken to in a year. Per CPP [name] hold off on the assessment for now and wait until her initial is scheduled. Message sent to [facility] requesting to get this patient scheduled for a initial appointment.   Patient has been scheduled for an initial appointment with [name], CPP on 01/17/2021 @ 22   Recent office visits:  09/02/2020 Althea Charon, CMA (PCP Office Telephone note) Neurology Referral ordered  09/01/2020 Lelon Huh, MD (PCP Office Visit) for Memory Loss- Aripiprazole 5 mg discontinued due to patient reported not taking, Hydrocodone-homatropine 5-1/5 mg patient not taking according to note discontinued by PCP, Hydroxyzine HCI 50 mg Patient not taking according to note provider discontinued  Medications: Outpatient Encounter Medications as of 12/21/2020  Medication Sig   azelastine (ASTELIN) 0.1 % nasal spray Place 1-2 sprays into both nostrils every 12 (twelve) hours as needed.   buPROPion (WELLBUTRIN SR) 100 MG 12 hr tablet Take 100 mg by mouth 2 (two) times daily.   butalbital-acetaminophen-caffeine (FIORICET) 50-325-40 MG tablet Take 1 tablet by mouth every 6 (six) hours as needed for headache.   empagliflozin (JARDIANCE) 25 MG TABS tablet Take 1 tablet (25 mg total) by mouth daily.   Esomeprazole Magnesium 20 MG TBEC Take 20 mg by mouth daily before breakfast.   ezetimibe (ZETIA) 10 MG tablet TAKE 1 TABLET BY MOUTH EVERYDAY AT BEDTIME (Patient taking differently: Take 10 mg by mouth at bedtime.)   fexofenadine (ALLEGRA) 180 MG tablet Take 180 mg by mouth daily as needed for allergies or rhinitis.   gabapentin (NEURONTIN) 300 MG capsule TAKE 2 CAPSULES (600 MG TOTAL)  BY MOUTH 2 (TWO) TIMES DAILY. MORNING & SUPPER   glipiZIDE (GLUCOTROL) 10 MG tablet TAKE 1 TABLET BY MOUTH EVERY DAY BEFORE BREAKFAST   hydrOXYzine (VISTARIL) 25 MG capsule Take 25 mg by mouth daily as needed for anxiety.   irbesartan (AVAPRO) 75 MG tablet TAKE 1 TABLET BY MOUTH EVERY DAY   Lancet Devices MISC    metFORMIN (GLUCOPHAGE-XR) 500 MG 24 hr tablet Take 2 tablets (1,000 mg total) by mouth daily. (Patient taking differently: Take 500 mg by mouth daily.)   methocarbamol (ROBAXIN) 500 MG tablet Take 1 tablet (500 mg total) by mouth every 6 (six) hours as needed for muscle spasms.   metoprolol-hydrochlorothiazide (LOPRESSOR HCT) 100-25 MG tablet Take 0.5 tablets by mouth daily.   montelukast (SINGULAIR) 10 MG tablet TAKE 1 TABLET BY MOUTH EVERY DAY (Patient taking differently: Take 10 mg by mouth daily as needed (allergies).)   Olopatadine HCl 0.7 % SOLN Apply 1 drop to eye daily as needed.   ondansetron (ZOFRAN ODT) 4 MG disintegrating tablet Take 1 tablet (4 mg total) by mouth every 8 (eight) hours as needed for nausea or vomiting.   ondansetron (ZOFRAN) 4 MG tablet Take 1 tablet (4 mg total) by mouth every 8 (eight) hours as needed.   oxyCODONE (OXY IR/ROXICODONE) 5 MG immediate release tablet Take 1 tablet (5 mg total) by mouth every 6 (six) hours as needed for moderate pain ((score 4 to 6)).   rosuvastatin (CRESTOR) 20 MG tablet TAKE 1 TABLET BY MOUTH EVERY DAY (Patient taking differently: Take  20 mg by mouth at bedtime.)   senna (SENOKOT) 8.6 MG TABS tablet Take 1 tablet (8.6 mg total) by mouth 2 (two) times daily.   sertraline (ZOLOFT) 100 MG tablet Take 200 mg by mouth daily.   traZODone (DESYREL) 50 MG tablet Take 50-100 mg by mouth at bedtime as needed for sleep.    valACYclovir (VALTREX) 1000 MG tablet Take 1 tablet daily for 5 days as needed   No facility-administered encounter medications on file as of 12/21/2020.   Lynann Bologna, CPA/CMA Clinical Pharmacist Assistant Phone:  [phone]

## 2020-12-22 ENCOUNTER — Telehealth: Payer: Self-pay | Admitting: *Deleted

## 2020-12-22 ENCOUNTER — Other Ambulatory Visit: Payer: Self-pay | Admitting: Family Medicine

## 2020-12-22 NOTE — Chronic Care Management (AMB) (Signed)
  Care Management   Note  12/22/2020 Name: Katie Buckley MRN: [id number] DOB: 1950/08/10  Katie Buckley is a 70 y.o. year old female who is a primary care patient of Caryn Section, Kirstie Peri, MD and is actively engaged with the care management team. I reached out to [place] by phone today to assist with scheduling an initial visit with the Pharmacist  Follow up plan: Unsuccessful telephone outreach attempt made. A HIPAA compliant phone message was left for the patient providing contact information and requesting a return call.  The care management team will reach out to the patient again over the next 7 days.  If patient returns call to provider office, please advise to call [place] [facility] at [phone].  [place] Management

## 2020-12-22 NOTE — Chronic Care Management (AMB) (Signed)
Care Management   Note  12/22/2020 Name: Katie Buckley MRN: [id number] DOB: [birth year]  Katie Buckley is a 70 y.o. year old female who is a primary care patient of Sherrie Mustache, Demetrios Isaacs, MD and is actively engaged with the care management team. I reached out to [name] by phone today to assist with scheduling an initial visit with the Pharmacist Ms. Fasnacht was given information about Chronic Care Management services today including:  CCM service includes personalized support from designated clinical staff supervised by her physician, including individualized plan of care and coordination with other care providers 24/7 contact phone numbers for assistance for urgent and routine care needs. Service will only be billed when office clinical staff spend 20 minutes or more in a month to coordinate care. Only one practitioner may furnish and bill the service in a calendar month. The patient may stop CCM services at any time (effective at the end of the month) by phone call to the office staff. The patient will be responsible for cost sharing (co-pay) of up to 20% of the service fee (after annual deductible is met).  Patient agreed to services and verbal consent obtained.   Follow up plan: Telephone appointment with care management team member scheduled for:01/17/2021  [facility] Guide, Embedded Care Coordination [facility] Management

## 2020-12-28 DIAGNOSIS — M542 Cervicalgia: Secondary | ICD-10-CM | POA: Diagnosis not present

## 2020-12-28 DIAGNOSIS — Z981 Arthrodesis status: Secondary | ICD-10-CM | POA: Diagnosis not present

## 2020-12-28 DIAGNOSIS — M4322 Fusion of spine, cervical region: Secondary | ICD-10-CM | POA: Diagnosis not present

## 2021-01-03 ENCOUNTER — Ambulatory Visit: Payer: Self-pay | Admitting: Family Medicine

## 2021-01-03 ENCOUNTER — Telehealth: Payer: Self-pay | Admitting: Physical Therapy

## 2021-01-03 NOTE — Telephone Encounter (Signed)
Called Dr. Deetta Perla (pt's cervical spine surgeon) at [facility] to inquire if he has any specific activity limitations/precautions/protocol that he would like followed during PT. Left message with Abigail Butts who took clinic telephone and fax number and sent Dr. Lacinda Axon a message.   Katie Buckley. Graylon Good, PT, DPT 01/03/21, 3:06 PM

## 2021-01-04 NOTE — Telephone Encounter (Signed)
Dr. Jonathon Jordan office called back and said no specific restrictions or protocol except  they don't want patient lifting more than 25lbs. ROM is okay.   Katie Buckley. Graylon Good, PT, DPT 01/04/21, 1:06 PM

## 2021-01-05 ENCOUNTER — Ambulatory Visit: Payer: PPO | Admitting: Physical Therapy

## 2021-01-07 ENCOUNTER — Ambulatory Visit: Payer: Self-pay | Admitting: Family Medicine

## 2021-01-12 ENCOUNTER — Ambulatory Visit: Payer: PPO | Attending: Neurosurgery | Admitting: Physical Therapy

## 2021-01-12 ENCOUNTER — Encounter: Payer: Self-pay | Admitting: Physical Therapy

## 2021-01-12 ENCOUNTER — Ambulatory Visit: Payer: PPO | Admitting: Physical Therapy

## 2021-01-12 ENCOUNTER — Telehealth: Payer: Self-pay

## 2021-01-12 DIAGNOSIS — R296 Repeated falls: Secondary | ICD-10-CM | POA: Diagnosis not present

## 2021-01-12 DIAGNOSIS — M542 Cervicalgia: Secondary | ICD-10-CM | POA: Insufficient documentation

## 2021-01-12 DIAGNOSIS — R2681 Unsteadiness on feet: Secondary | ICD-10-CM

## 2021-01-12 DIAGNOSIS — M6281 Muscle weakness (generalized): Secondary | ICD-10-CM | POA: Diagnosis not present

## 2021-01-12 DIAGNOSIS — R42 Dizziness and giddiness: Secondary | ICD-10-CM | POA: Diagnosis not present

## 2021-01-12 NOTE — Therapy (Addendum)
[facility] [facility] [address], [place], [address] Phone: [phone]   Fax:  [phone]  Physical Therapy Evaluation  Patient Details  Name: Katie Buckley MRN: [id number] Date of Birth: 06-01-1951 Referring Provider (PT): Loleta Dicker, [place]  Encounter Date: 01/12/2021   PT End of Session - 01/13/21 0932     Visit Number 1    Number of Visits 24    Date for PT Re-Evaluation 04/06/21    Authorization Type [employer]: (Reporting period from 01/12/2021)    Progress Note Due on Visit 10    PT Start Time 1510    PT Stop Time 1600    PT Time Calculation (min) 50 min    Equipment Utilized During Treatment Gait belt    Activity Tolerance Patient tolerated treatment well    Behavior During Therapy [facility] for tasks assessed/performed             Past Medical History:  Diagnosis Date   Anxiety    Aortic stenosis, mild    a.) mean gradient 18.7 mmHg on 10/2020 TTE   Depression    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   T2DM (type 2 diabetes mellitus) ([place])     Past Surgical History:  Procedure Laterality Date   ANTERIOR CERVICAL DECOMP/DISCECTOMY FUSION N/A 11/15/2020   Procedure: ANTERIOR CERVICAL DECOMPRESSION/DISCECTOMY FUSION 1 LEVEL C3/4;  Surgeon: Deetta Perla, MD;  Location: [facility] ORS;  Service: Neurosurgery;  Laterality: N/A;   AUGMENTATION MAMMAPLASTY Bilateral    breast implants   Bone Spur  2007   foot   CARDIAC CATHETERIZATION     Carpal Tunnel Symdrome  2008   as repeated in 2009   CATARACT EXTRACTION W/PHACO Left 01/16/2019   Procedure:  CATARACT EXTRACTION PHACO AND INTRAOCULAR LENS PLACEMENT ([place]) LEFT DIABETES VISION BLUE;  Surgeon: Marchia Meiers, MD;  Location: [facility] ORS;  Service: Ophthalmology;  Laterality: Left;  [place]  00:47 CDE 6.62 Fluid pack lot # [id number]   CATARACT EXTRACTION W/PHACO Right 02/06/2019   Procedure: CATARACT EXTRACTION PHACO AND INTRAOCULAR LENS PLACEMENT (IOC);  Surgeon: Marchia Meiers, MD;  Location: [facility] ORS;  Service: Ophthalmology;  Laterality: Right;  [place] 00:54.6 CDE 6.52 FLUID PACK LOT # [id number]   CHOLECYSTECTOMY  2007   COLONOSCOPY WITH PROPOFOL N/A 11/17/2015   Procedure: COLONOSCOPY WITH PROPOFOL;  Surgeon: Manya Silvas, MD;  Location: [facility] ENDOSCOPY;  Service: Endoscopy;  Laterality: N/A;   ESOPHAGOGASTRODUODENOSCOPY (EGD) WITH PROPOFOL N/A 11/17/2015   Procedure: ESOPHAGOGASTRODUODENOSCOPY (EGD) WITH PROPOFOL;  Surgeon: Manya Silvas, MD;  Location: [facility] ENDOSCOPY;  Service: Endoscopy;  Laterality: N/A;   EYE SURGERY Bilateral    cataract extraction   FOOT SURGERY     MANDIBLE SURGERY     NASAL SINUS SURGERY  03/2013   Multile surgery Dr. Carlis Abbott. 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 Tamala Julian   TONSILLECTOMY  1960    There were no vitals filed for this visit.      Subjective  Assessment - 01/12/21 1656     Subjective Patient arrives with concerns about balance at initial evaluation. Patient states that she is falling quite a bit (once a month). She reports that she has never been given a clear answer about why she is falling and wants to try to figure it out. Patient states that she originally was checked for Parkinson's and underwent "a bunch of tests" to try to figure out reason for falls. 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 Per patient report the surgeon determined that her spine was pushing on a disc in her neck and  recommended surgery to ease the pressure on the disc.  Patient reports that the surgery (ACDF at C3-C4 on 11/15/2020) went well, but she is still experiencing R posterior to anterior neck pain. Patient believes pain is muscular. Patient states that she can move her neck freely and that her surgeon encouraged her to move her neck and look around post surgery. Patient reports occasional LB pain.  Patient reports fall episodes happen at the same rate pre and post-surgery. Her last fall happened "the week before last." This is her only fall since her neck surgery. Patient believe her falling episodes last 20-30 minutes. Usually, she lays on the floor during that time until she gets oriented again. Patient plans to sit on the floor the next time she feels a fall coming on before she falls. Patient believes she can feel the fall coming on and the "next thing she knows she is on the floor." Patient describes feeling weakness/imbalance build up before she falls. Patient describes falling/being pulled to right during most of her falling episodes. However, patient reports that it can happen to either side. She believes she felt pulled to the left on her last fall. She states that she tries to get to car before she falls in public. While walking to her car once she fell. During this fall the patient reports having an advertisement sign fall on top of her. When this happened, she tried to get the sign off and recalls not being able to move her legs during that instance. Patient cannot confirm or deny a LOC while falling. Instead she describes the following feelings during a fall episode: something does not feel right, she is losing control of her body, she cannot balance herself out, "I feel my spine shift an then I fall and fall and fall". Patient reports having vision checked. However, she states that her eyes feel very tired right before she falls. She recalls blinking and blinking them, but not feeling stable. Patient reports  having double vision and feeling like the room/everything is spinning around her at times when she lays down in bed. Patient states that she gets dizzy sometimes when standing up. Dizziness gets better once she gets situated. Patient states that if she can feel dizziness come on when she turns too quickly. Patient denies a problem with dizziness/feeling of falling while driving. Patient fall report includes the following instances/locations in shower, over her shoelaces, at the department store, bringing in groceries, and over a metal divider at [employer] When the patient fell at [employer] she states that she hit her head and shoulder on the divider. Patient reports that she was having headaches for about a year before the spine surgery. Patient believes headaches have continued at the same frequency as before surgery (every day) .  Patient states that headaches started about a year before she had her neck surgery, but that she did not really experience headaches before that. Patient reports concern that no one has checked her for vertigo. Patient states that her sister has vertigo and falls frequently as well. Sister is currently in the hospital due to a fall. Patient states that her husband gets dizzy sometimes, but does not experience anything like she does. Patient reports becoming anxious after she has fallen, but does not believe her anxiety is resulting in dizziness or falls. Patient describes her "body is going forward but feet cannot keep up" while walking at times. She feels like she must run to keep her feet caught up with the rest of her body. 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 Patient reports occasional numbness and stinging in both of her hands. Patient reports heart murmur that was not surgically address at birth and per physician report has become more pronounced recently. Patient also reports unexplained weight loss of 25-35lbs in the past year. Patient states that she was  not trying to lose weight. Chart review reveals that pt surgeon felt residual balance issues after ACDF was within expected limits at last post op follow up. Notes from neurologist report initially concerning for parkinsonism but negative Syn-one test. Patient reports having caloric testing in the past but does not remember outcome.    Pertinent History Patient is a 70 year old female who presents to outpatient physical therapy with a referral for s/p cervical spinal fusion (ANTERIOR CERVICAL DECOMPRESSION/DISCECTOMY FUSION 1 LEVEL C3/4, 11/15/2020), cervicalgia, multifactoral causes for imbalance, cervical myelopathy. This patient's chief complaints consist of imbalance, history of falls, neck pain, and neck hypomobility, leading to the following functional deficits: difficulty with walking, stairs, lifting items including groceries, balance, bending over, moving her neck, performing ADL's such as getting dressed, showering, bathing. Relevant past medical history and comorbidities include anxiety, aortic stenosis, depression, skin cancer, GERD, heart murmur, hyperchloremia, hypertension, neuropathy, skin cancer, type II diabetes mellitus, asthma, anxiety, left ventricular hypertrophy, diabetic neuropathy and per patient report unexplained weight loss (25-35lbs in past year) and a heart murmur. Patient denies history of stroke, seizure, lung problems, bowel or bladder changes, groin numbness or tingling.    Limitations Lifting;Walking;Standing;House hold activities;Other (comment)   walking, stairs, lifting items including groceries, balance, bending over, moving her neck, performing ADL's such as getting dressed, showering, bathing.   Patient Stated Goals "build up body strength and try to get imbalance under control"    Currently in Pain? Yes    Pain Score 4    B: 4 W: 8   Pain Location Neck    Pain Orientation Right;Posterior    Pain Type Surgical pain    Pain Radiating Towards Patient experiences numbness  and stinging in both hands.    Pain Onset More than a month ago    Pain Frequency Constant    Aggravating Factors  Moving her neck                [facility] PT Assessment - 01/13/21 0001       Assessment   Medical Diagnosis s/p cervical spinal fusion (11/15/2020), cervicalgia, multifactoral causes for imbalance, cervical myelopathy    Referring Provider (PT) Loleta Dicker, PA    Onset Date/Surgical Date 11/15/20      Precautions   Precautions Other (comment)   Per MD patient should not lift more than 25lbs at this time.     Balance Screen  Has the patient fallen in the past 6 months Yes    How many times? 6    Has the patient had a decrease in activity level because of a fear of falling?  Yes    Is the patient reluctant to leave their home because of a fear of falling?  Yes   Patient is afraid to walk around neighborhood for fear of falling. Patient is afraid that she is going to fall and seriously hurt hurself one of these days.     Home Environment   Living Environment Private residence    Living Arrangements Spouse/significant other    Type of [place] to enter    Entrance Stairs-Number of Steps 3 stairs to enter unit. Unit has hand rails.    Home Equipment Grab bars - tub/shower   Patient reports falling in shower twice     Prior Function   Level of Independence Independent    Vocation Retired   Worked in Occupational [employer] - loves retail therapy, walking around the neighboorhood, read, watch movies      Cognition   Overall Cognitive Status Within Functional Limits for tasks assessed      Functional Gait  Assessment   Gait assessed  Yes    Gait Level Surface Walks 20 ft in less than 7 sec but greater than 5.5 sec, uses assistive device, slower speed, mild gait deviations, or deviates 6-10 in outside of the 12 in walkway width.   4.69 seconds. Mild deviation, imbalance noticed. Patient swayed slightly while walking.   Change in Gait  Speed Able to change speed, demonstrates mild gait deviations, deviates 6-10 in outside of the 12 in walkway width, or no gait deviations, unable to achieve a major change in velocity, or uses a change in velocity, or uses an assistive device.    Gait with Horizontal Head Turns Performs head turns with moderate changes in gait velocity, slows down, deviates 10-15 in outside 12 in walkway width but recovers, can continue to walk.    Gait with Vertical Head Turns Performs task with slight change in gait velocity (eg, minor disruption to smooth gait path), deviates 6 - 10 in outside 12 in walkway width or uses assistive device    Gait and Pivot Turn Turns slowly, requires verbal cueing, or requires several small steps to catch balance following turn and stop    Step Over Obstacle Is able to step over one shoe box (4.5 in total height) but must slow down and adjust steps to clear box safely. May require verbal cueing.    Gait with Narrow Base of Support Ambulates less than 4 steps heel to toe or cannot perform without assistance.    Gait with Eyes Closed Cannot walk 20 ft without assistance, severe gait deviations or imbalance, deviates greater than 15 in outside 12 in walkway width or will not attempt task.    Ambulating Backwards Cannot walk 20 ft without assistance, severe gait deviations or imbalance, deviates greater than 15 in outside 12 in walkway width or will not attempt task.   walked approximately 6 feet - severe deviations   Steps Alternating feet, must use rail.    Total Score 11             OBJECTIVE:  EDUCATION/COGNITION: Within Functional Limits for Tasks Assessed   FUNCTION FOTO Score (neck): 45 on 01/10/2021  OBSERVATION/INSPECTION: - Transfers: Patient displayed no difficulty standing from sitting on surfaces  of varying heights.  - Gait: Patient displayed slight sway/unbalance while walking. Patient primarily swayed to the right.  - Stairs: Patient confidently ambulated up 4  stairs using step through pattern with L UE support and SBA. Patient very apprehensive about walking up 4 stairs without UE support. However, patient was able to to go up 4 stairs using step through pattern without UE and CGA. Patient unable to go down stairs without UE support.   SPINE MOTION Cervical Spine AROM R/L - (50) Cervical Flexion: 50 - (80) Cervical Extension: 35 - (45/45) Cervical Lateral Flexion: 30/40  - (85/85) Cervical Rotation 30 degree looking to L and 40 to right. Painful with all motion.   (#) Average/norm value in degrees *Pain with all cervical motion. Overpressure not performed due to recent cervical fusion and patient pain.   NEUROLOGICAL: Coordination:  Alternating toe taps: Intact/normal Alternating hands: Intact/normal  Dermatomes: C2-T2 and L1-S2 Assessed. All dermatomes appear equal and intact to light touch except for the following: C4: diminished sensation on R side compared to L side.   Upper Motor Neuron Screen: Hoffman's: -- normal/negative  Clonus: -- normal/negative     PERIPHERAL JOINT MOTION: UE and LE AROM grossly within functional limits for mobility. May benefit from further assessment if deemed appropriate.   UE MUSCLE PERFORMANCE (MMT): [facility] for tasks assessed. Patient would benefit from more in depth UE MMT assessment at next session   LE MUSCLE PERFORMANCE (MMT):  Patient would benefit from assessment of standing hip extension and standing hip abduction at next session.   *Indicates pain 01/12/21 Date Date  Joint/Motion R/L R/L R/L  Hip        Flexion 3+/3+    Extension (knee ext)     Abduction     Adduction     Knee        Extension 4+/4+    Flexion 4/4    Ankle/Foot        Dorsiflexion  3+/3+    Great toe extension 5/5    Eversion 5/5    Plantarflexion 4/4       FUNCTIONAL/BALANCE TESTS: Functional Gait Assessment: 11/30 high fall risk  Gait Speed: 1.27 m/s  TREATMENT - Education on diagnosis, prognosis, POC, and  antomy and physiology of current condition.         PT Education - 01/13/21 0932     Education Details Education on diagnosis, prognosis, POC, and antomy and physiology of current condition.    Person(s) Educated Patient    Methods Explanation;Demonstration;Tactile cues;Verbal cues    Comprehension Verbalized understanding;Returned demonstration;Verbal cues required;Tactile cues required              PT Short Term Goals - 01/13/21 0934       PT SHORT TERM GOAL #1   Title Patient will be independent with inital home exercise program to improve strength/mobility/balance and functional independence with ADLs.    Baseline HEP to be provided at 2nd visit    Time 3    Period Weeks    Status New    Target Date 02/03/21               PT Long Term Goals - 01/13/21 0935       PT LONG TERM GOAL #1   Title Patient will be independent with long term home exercise program to improve strength/mobility/balance and functional independence with ADLs.    Baseline HEP to be provided at 2nd session    Time 12  Period Weeks    Status New   TARGET DATE FOR ALL LONG TERM GOALS: 04/06/2021   Target Date 04/06/21      PT LONG TERM GOAL #2   Title Improve cervical AROM rotation to equal or greater than 60 degrees each direction to improve ability to check blind spot when driving, veiw surroundings, and allow patient to complete valued activities with less difficulty.    Baseline 30 L, 40 R (01/12/2021)    Time 12    Period Weeks    Status New    Target Date 04/06/21      PT LONG TERM GOAL #3   Title Patient will score equal or greater than 25/30 on Functional Gait Assessment to demonstrate reduction in fall risk to low fall risk classification.    Baseline 11/30 (01/12/2021)    Time 12    Period Weeks    Status New      PT LONG TERM GOAL #4   Title Pt will increase LE strength of by at least 1/2 MMT grade in order to demonstrate improvement in strength, function, and general  mobility    Baseline BIL hip flexion 3+, knee extension 4+ (01/13/2021)      PT LONG TERM GOAL #5   Title Demonstrate improved FOTO score to 53 or greater to demonstrate improvement in overall condition and self-reported functional ability    Baseline 45 (01/10/2021)    Time 12    Period Weeks    Status New                   Plan - 01/13/21 1014     Clinical Impression Statement Patient is a 70 year old female who presents to outpatient physical therapy with a referral for s/p cervical spinal fusion (ANTERIOR CERVICAL DECOMPRESSION/DISCECTOMY FUSION 1 LEVEL C3/4, 11/15/2020), cervicalgia, multifactoral causes for imbalance, cervical myelopathy. Patient presents with signs and symptoms consistent with unsteadiness on feet with loss of balance and frequent falling and neck pain/stiffness s/p ACDF at C3-C4 performed 11/15/2020. Patient unstaediness and falling appears to be multifactorial but signs and symptoms suggest vestibular component is highly likely. Patient would benefit from vestibular assessment at subsequent session. Patient reports and demonstrates visual disturbance with head movements and episodic falls that are preceeded by changes in her body/perception that lasts about 20 min including leading up to and after a fall. Patient also reports difficulty with gait at times that rembles festination, but festination was not observed in clinic. Patient symptoms vary and fall episodes come on in an  unpredicatable manner per patient. Patient demonstrated community ambulation speed today (1.39ms), but has slight right leaning sway while ambulating. Additionally, patient is at high risk for future falls as evidenced by FGA score (11/30) and past fall history. Decreased cervical mobility, pain, and strength limits patient ability to view surrounds, UE functional ability, and ability to complete valued activites without pain. Patient FOTO score demonstrated patient limited overal condtion and self  reported functinal ability. (53). Patient presents with significant pain, ROM, activity tolerance, motor control, sensation, balance, muscle performance (strength, power, endurance), muscle tension and gait impairments that limit ability to perform functional activities such as walking, squatting, stairs, standing, lifting items including groceries, shopping, bending over, moving neck, driving, and performing ADL's such as getting dressed, showering, and bathing. Patient will benefit from skilled physical therapy intervention to address current body structure impairments and activity limitations to improve function and work towards goals set in current POC in order to  return to prior level of function or maximal functional improvement.    Personal Factors and Comorbidities Comorbidity 3+;Past/Current Experience;Time since onset of injury/illness/exacerbation    Comorbidities anxiety, aortic stenosis, depression, skin cancer, GERD, heart murmur, hyperchloremia, hypertension, neuropathy, skin cancer, type II diabetes mellitus, asthma, anxiety, left ventricular hypertrophy, diabetic neuropathy and per patient report unexplained weight loss (25-35lbs in past year) and a heart murmur    Examination-Activity Limitations Bathing;Bend;Hygiene/Grooming;Dressing;Carry;Lift;Locomotion Level;Stairs;Squat;Stand;Transfers;Other   walking, stairs, lifting items including groceries, balance, bending over, moving her neck, performing ADL's such as getting dressed, showering, bathing.   Examination-Participation Restrictions Driving;Cleaning;Interpersonal Relationship;Yard Work;Community Activity;Laundry;Shop    Stability/Clinical Decision Making Unstable/Unpredictable    Clinical Decision Making High    Rehab Potential Fair    PT Frequency 2x / week    PT Duration 12 weeks    PT Treatment/Interventions Cryotherapy;ADLs/Self Care Home Management;Moist Heat;Gait training;Stair training;Therapeutic activities;Functional  mobility training;Therapeutic exercise;Balance training;Neuromuscular re-education;Patient/family education;Manual techniques;Passive range of motion;Vestibular;Dry needling;Biofeedback;Canalith Repostioning;DME Instruction    PT Next Visit Plan Vestibular examination. Progress strength and balance activities as tolerated.    PT Home Exercise Plan Provide HEP at 2nd session. [place] HEP as tolerated    Consulted and Agree with Plan of Care Patient             Patient will benefit from skilled therapeutic intervention in order to improve the following deficits and impairments:  Impaired perceived functional ability, Abnormal gait, Decreased activity tolerance, Decreased range of motion, Decreased strength, Dizziness, Hypomobility, Impaired sensation, Pain, Decreased balance, Decreased mobility, Difficulty walking, Impaired UE functional use  Visit Diagnosis: Unsteadiness on feet - Plan: PT plan of care cert/re-cert  Repeated falls - Plan: PT plan of care cert/re-cert  Cervicalgia - Plan: PT plan of care cert/re-cert  Muscle weakness (generalized) - Plan: PT plan of care cert/re-cert  Dizziness and giddiness - Plan: PT plan of care cert/re-cert     Problem List Patient Active Problem List   Diagnosis Date Noted   Cervical myelopathy ([place]) 11/15/2020   LVH (left ventricular hypertrophy) 10/14/2020   COVID-19 05/19/2020   MRSA cellulitis 04/19/2018   Decreased libido 02/04/2018   Uncontrolled type 2 diabetes mellitus with diabetic neuropathy, without long-term current use of insulin ([place]) 04/20/2017   H/O adenomatous polyp of colon 08/20/2015   Rupture of implant of right breast 05/18/2015   Anxiety disorder 04/26/2015   Allergic rhinitis 04/21/2015   Anemia 04/21/2015   Asthma 04/21/2015   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/21/2015   Chronic infection of sinus 04/21/2015   Depression 04/21/2015   GERD (gastroesophageal reflux disease) 04/21/2015   Headache 04/21/2015   Menopausal  symptoms 04/21/2015   Obesity 04/21/2015   Palpitations 04/21/2015   Vitamin B12 deficiency 04/21/2015   Aortic stenosis [id number]   Diastolic dysfunction 123XX123   Diabetes mellitus out of control ([place]) 06/12/2006   Barrett esophagus 06/13/2003   Essential hypertension 06/12/1998   Genital herpes 06/12/1998   Hyperlipidemia, mixed 06/12/1998   Sherryll Burger, SPT  Student physical therapist under direct supervision of licensed physical therapists during the entirety of the session.   Everlean Alstrom. Graylon Good, PT, DPT 01/13/21, 12:36 PM   [place] [facility] [address] [place], [address] Phone: [phone]   Fax:  [phone]  Name: HARGUN FIECHTNER MRN: [id number] Date of Birth: 06/03/1951

## 2021-01-12 NOTE — Progress Notes (Signed)
Chronic Care Management Pharmacy Assistant   Name: Katie Buckley  MRN: [id number] DOB: 09-02-1950  Initial Chart Prep for Katie Buckley, CPP with a scheduled appointment for 01/17/2021 @ 1230.  Conditions to be addressed/monitored: HTN, HLD, DMII, Anxiety, Depression, and Aortic Stenosis, LVH, Asthma, Chronic infection of sinus, GERD, Burning feet syndrome, Genital Herpes,  Obesity,  Primary concerns for visit include: She would like to see if any of her medications that are taking together that can cause her to be dizzy. Patient reports that she is feeling dizzy every week, and she has had falls regarding to the dizziness. She just feel the week before last.   Recent office visits:  10/27/2020 Lelon Huh, MD (PCP Office visit) for Annual Exam- Ozempic discontinued; Increased jardiance to 25 mg 1 tablet daily; Metformin increased to 500 mg 2 tablets daily; US Carotid Duplex Bilateral ordered; A1C lab ordered  10/15/2020 Lelon Huh, MD (PCP Patient Message) Started Irbesartan 75 mg daily  09/02/2020 Althea Charon, CMA (PCP telephone call) labs discussed Neurology referral Ordered  09/01/2020 Lelon Huh, MD (PCP Office visit) for Memory loss- Discontinued: Aripiprazole 5 mg Daily, Hydrocodone-Homatropine 5-1.5 mg, Hydroxyzine HCl 50 mg due to patient not taking; labs ordered  Recent consult visits:  12/28/2020 Malen Gauze, MD (Neurosurgery) No medication changes noted- Patient instructed to return in 6 weeks  11/30/2020 Malen Gauze, MD (Neurosurgery) - for Post Operative Visit- No medication changes noted- Patient instructed to follow-up in 3 weeks  10/20/2020 Hemang Leeanne Deed, MD (Neurology) for Atypical Parkinsonism- No medication changes noted- Patient instructed to return in 4 months  Hospital visits:  Medication Reconciliation was completed by comparing discharge summary, patient's EMR and Pharmacy list, and upon discussion with patient.  Admitted  to the hospital on 09/29/2020 due to Hyperglycemia, Hypertension and Nausea. Discharge date was 09/29/2020. Discharged from [facility] Emergency Department.    New?Medications Started at [facility] Discharge:?? -started ondansetron 4 MG disintegrating tablet due to Nausea  Medication Changes at Hospital Discharge: -Changed None ID  Medications Discontinued at Hospital Discharge: -Stopped None ID  Medications that remain the same after Hospital Discharge:??  -All other medications will remain the same.    Medication Reconciliation was completed by comparing discharge summary, patient's EMR and Pharmacy list, and upon discussion with patient.  Admitted to the hospital on 11/15/2020 due to Cervical Myelopathy. Discharge date was 11/16/2020. Discharged from [facility].    New?Medications Started at [facility] Discharge:?? -START taking: methocarbamol (ROBAXIN)Take 1 tablet (500 mg total) by mouth every 6 (six) hours as needed for muscle spasms. oxyCODONE (Oxy IR/ROXICODONE) Take 1 tablet (5 mg total) by mouth every 6 (six) hours as needed for moderate pain senna (SENOKOT) Take 1 tablet (8.6 mg total) by mouth 2 (two) times daily. Medication Changes at   -Changed None ID  Medications Discontinued at Hospital Discharge: -STOP taking: meloxicam 15 MG tablet (MOBIC)  Medications that remain the same after Hospital Discharge:??  -All other medications will remain the same.    Medications: Outpatient Encounter Medications as of 01/12/2021  Medication Sig   azelastine (ASTELIN) 0.1 % nasal spray Place 1-2 sprays into both nostrils every 12 (twelve) hours as needed.   buPROPion (WELLBUTRIN SR) 100 MG 12 hr tablet Take 100 mg by mouth 2 (two) times daily.   butalbital-acetaminophen-caffeine (FIORICET) 50-325-40 MG tablet Take 1 tablet by mouth every 6 (six) hours as needed for headache.   empagliflozin (JARDIANCE) 25 MG TABS tablet Take  1 tablet (25 mg  total) by mouth daily.   Esomeprazole Magnesium 20 MG TBEC Take 20 mg by mouth daily before breakfast.   ezetimibe (ZETIA) 10 MG tablet TAKE 1 TABLET BY MOUTH EVERYDAY AT BEDTIME (Patient taking differently: Take 10 mg by mouth at bedtime.)   fexofenadine (ALLEGRA) 180 MG tablet Take 180 mg by mouth daily as needed for allergies or rhinitis.   gabapentin (NEURONTIN) 300 MG capsule TAKE 2 CAPSULES (600 MG TOTAL) BY MOUTH 2 (TWO) TIMES DAILY. MORNING & SUPPER   glipiZIDE (GLUCOTROL) 10 MG tablet TAKE 1 TABLET BY MOUTH EVERY DAY BEFORE BREAKFAST   hydrOXYzine (VISTARIL) 25 MG capsule Take 25 mg by mouth daily as needed for anxiety.   irbesartan (AVAPRO) 75 MG tablet TAKE 1 TABLET BY MOUTH EVERY DAY   Lancet Devices MISC    metFORMIN (GLUCOPHAGE-XR) 500 MG 24 hr tablet Take 2 tablets (1,000 mg total) by mouth daily. (Patient taking differently: Take 500 mg by mouth daily.)   methocarbamol (ROBAXIN) 500 MG tablet Take 1 tablet (500 mg total) by mouth every 6 (six) hours as needed for muscle spasms.   metoprolol-hydrochlorothiazide (LOPRESSOR HCT) 100-25 MG tablet Take 0.5 tablets by mouth daily.   montelukast (SINGULAIR) 10 MG tablet TAKE 1 TABLET BY MOUTH EVERY DAY (Patient taking differently: Take 10 mg by mouth daily as needed (allergies).)   Olopatadine HCl 0.7 % SOLN Apply 1 drop to eye daily as needed.   ondansetron (ZOFRAN ODT) 4 MG disintegrating tablet Take 1 tablet (4 mg total) by mouth every 8 (eight) hours as needed for nausea or vomiting.   ondansetron (ZOFRAN) 4 MG tablet Take 1 tablet (4 mg total) by mouth every 8 (eight) hours as needed.   oxyCODONE (OXY IR/ROXICODONE) 5 MG immediate release tablet Take 1 tablet (5 mg total) by mouth every 6 (six) hours as needed for moderate pain ((score 4 to 6)).   rosuvastatin (CRESTOR) 20 MG tablet Take 1 tablet (20 mg total) by mouth every evening.   senna (SENOKOT) 8.6 MG TABS tablet Take 1 tablet (8.6 mg total) by mouth 2 (two) times daily.    sertraline (ZOLOFT) 100 MG tablet Take 200 mg by mouth daily.   traZODone (DESYREL) 50 MG tablet Take 50-100 mg by mouth at bedtime as needed for sleep.    valACYclovir (VALTREX) 1000 MG tablet Take 1 tablet daily for 5 days as needed   No facility-administered encounter medications on file as of 01/12/2021.   Care Gaps: FOOT EXAM Zoster Vaccines- Shingrix  DEXA SCAN TETANUS/TDAP (last completed 10/09/2007) COVID-19 Vaccine  Booster 3 COLONOSCOPY (last completed 11/17/2015) INFLUENZA VACCINE (last completed 03/25/2019)  Star Rating Drugs: Rosuvastatin 20 mg last filled on 12/22/2020 for a 90-Day supply with [facility] Glipizide 10 mg last filled on 12/17/2020 for a 90-Day supply with [facility] Irbesartan 75 mg last filled on 12/26/2020 for a 90-Day supply with [facility] Jardiance 25 mg last filled on 10/29/2020 for a 90-Day supply with [facility] Metformin 500 mg last filled on 10/29/2020 for a 90-Day supply with [facility]  Have you seen any other providers since your last visit? no  Any changes in your medications or health? Yes she did have a surgery recently not on pain medications but she is on muscle relaxer which is a new medication for her. She is also starting to have daily headaches and pain in her Right shoulder   Any side effects from any medications? Yes she thinks that some  of her medication may be making her dizzy and causing falls  Do you have an symptoms or problems not managed by your medications? Yes the patient seems to be getting a headache every day and she is having some really bad shoulder pain.  Any concerns about your health right now? Yes patient reports that her blood sugars seem to more elevated as well as her blood pressure has been elevated to.  Has your provider asked that you check blood pressure, blood sugar, or follow special diet at home? No patient reports she hasn't been asked to keep a log of any of her numbers, but she really doesn't  check her blood sugars or her blood pressure on a regular basis. Patient states she is also not on a certain diet.  Do you get any type of exercise on a regular basis? No patient states she cleans, but she doesn't really get any exercise and it's been too hot to walk. When asked does she walk on cooler days patient responded "not really"  Can you think of a goal you would like to reach for your health? Yes the patient stated she would like to get her A1C down as well as her blood pressure down.  Do you have any problems getting your medications? No patient reports that she doesn't have any issues with getting her medications at this time nor does she have issues with anything financial.    Please bring medications and supplements to appointment   Lynann Bologna, [place] Pharmacist Assistant Phone: [phone]

## 2021-01-13 ENCOUNTER — Encounter: Payer: Self-pay | Admitting: Physical Therapy

## 2021-01-17 ENCOUNTER — Telehealth: Payer: PPO

## 2021-01-17 NOTE — Progress Notes (Deleted)
Chronic Care Management Pharmacy Note  01/17/2021 Name:  Katie Buckley MRN:  [id number] DOB:  1951-02-17  Summary: ***  Recommendations/Changes made from today's visit: ***  Plan: ***   Subjective: Katie Buckley is an 70 y.o. year old female who is a primary patient of Fisher, Demetrios Isaacs, MD.  The CCM team was consulted for assistance with disease management and care coordination needs.    Engaged with patient by telephone for initial visit in response to provider referral for pharmacy case management and/or care coordination services.   Consent to Services:  The patient was given the following information about Chronic Care Management services today, agreed to services, and gave verbal consent: 1. CCM service includes personalized support from designated clinical staff supervised by the primary care provider, including individualized plan of care and coordination with other care providers 2. 24/7 contact phone numbers for assistance for urgent and routine care needs. 3. Service will only be billed when office clinical staff spend 20 minutes or more in a month to coordinate care. 4. Only one practitioner may furnish and bill the service in a calendar month. 5.The patient may stop CCM services at any time (effective at the end of the month) by phone call to the office staff. 6. The patient will be responsible for cost sharing (co-pay) of up to 20% of the service fee (after annual deductible is met). Patient agreed to services and consent obtained.  Patient Care Team: Malva Limes, MD as PCP - General (Family Medicine) Darliss Ridgel, MD as Referring Physician (Psychiatry) Lonell Face, MD as Consulting Physician (Neurology) Lucy Chris, MD as Consulting Physician (Neurosurgery) Gaspar Cola, [facility] (Pharmacist)  Recent office visits: 10/27/2020 Mila Merry, MD (PCP Office visit) for Annual Exam- Ozempic discontinued; Increased jardiance to 25 mg 1 tablet daily;  Metformin increased to 500 mg 2 tablets daily; US Carotid Duplex Bilateral ordered; A1C lab ordered   10/15/2020 Mila Merry, MD (PCP Patient Message) Started Irbesartan 75 mg daily   09/02/2020 Janey Greaser, CMA (PCP telephone call) labs discussed Neurology referral Ordered   09/01/2020 Mila Merry, MD (PCP Office visit) for Memory loss- Discontinued: Aripiprazole 5 mg Daily, Hydrocodone-Homatropine 5-1.5 mg, Hydroxyzine HCl 50 mg due to patient not taking; labs ordered  Recent consult visits: 12/28/2020 Nathaniel Man, MD (Neurosurgery) No medication changes noted- Patient instructed to return in 6 weeks   11/30/2020 Nathaniel Man, MD (Neurosurgery) - for Post Operative Visit- No medication changes noted- Patient instructed to follow-up in 3 weeks   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   Admitted to the hospital on 09/29/2020 due to Hyperglycemia, Hypertension and Nausea. Discharge date was 09/29/2020. Discharged from [facility].     New?Medications Started at [facility] Discharge:?? -started ondansetron 4 MG disintegrating tablet due to Nausea   Medication Changes at Hospital Discharge: -Changed None ID   Medications Discontinued at Hospital Discharge: -Stopped None ID   Medications that remain the same after Hospital Discharge:?? -All other medications will remain the same  Admitted to the hospital on 11/15/2020 due to Cervical Myelopathy. Discharge date was 11/16/2020. Discharged from [facility].     New?Medications Started at [facility] Discharge:?? -START taking: methocarbamol (ROBAXIN)Take 1 tablet (500 mg total) by mouth  every 6 (six)  hours as needed for muscle spasms. oxyCODONE (Oxy IR/ROXICODONE) Take 1 tablet (5 mg total) by mouth every 6 (six) hours as needed for moderate pain senna (SENOKOT) Take 1 tablet (8.6 mg total) by mouth 2 (two) times daily. Medication Changes at   -Changed None ID   Medications Discontinued at Hospital Discharge: -STOP taking: meloxicam 15 MG tablet (MOBIC)   Medications that remain the same after Hospital Discharge:?? -All other medications will remain the same.  Objective:  Lab Results  Component Value Date   CREATININE 0.79 11/15/2020   BUN 17 11/15/2020   GFRNONAA >60 11/15/2020   GFRAA 89 11/19/2019   NA 134 (L) 11/15/2020   K 4.0 11/15/2020   CALCIUM 9.1 11/15/2020   CO2 26 11/15/2020   GLUCOSE 282 (H) 11/15/2020    Lab Results  Component Value Date/Time   HGBA1C 9.9 (A) 10/27/2020 09:48 AM   HGBA1C 9.2 (H) 09/01/2020 09:07 AM   HGBA1C 9.6 (A) 11/19/2019 10:15 AM   HGBA1C 11.8 (H) 06/17/2019 03:44 PM   HGBA1C 8.5 07/13/2016 12:00 AM   HGBA1C 9.2 (H) 03/27/2013 06:21 PM    Last diabetic Eye exam:  Lab Results  Component Value Date/Time   HMDIABEYEEXA Retinopathy (A) 06/30/2020 12:00 AM    Last diabetic Foot exam: No results found for: HMDIABFOOTEX   Lab Results  Component Value Date   CHOL 294 (H) 09/01/2020   HDL 98 09/01/2020   LDLCALC 161 (H) 09/01/2020   TRIG 196 (H) 09/01/2020   CHOLHDL 3.0 09/01/2020    Hepatic Function Latest Ref Rng & Units 09/01/2020 05/31/2020 11/19/2019  Total Protein 6.0 - 8.5 g/dL 6.3 7.4 6.6  Albumin 3.8 - 4.8 g/dL 4.2 4.0 4.4  AST 0 - 40 IU/L $Remov'17 22 25  'pyWJpc$ ALT 0 - 32 IU/L $Remov'15 17 21  'QtcSRa$ Alk Phosphatase 44 - 121 IU/L 111 79 102  Total Bilirubin 0.0 - 1.2 mg/dL 0.2 0.6 0.3    Lab Results  Component Value Date/Time   TSH 1.650 09/01/2020 09:07 AM   TSH 1.380 11/19/2019 09:58 AM    CBC Latest Ref Rng & Units 11/03/2020 09/29/2020 09/01/2020  WBC 4.0 - 10.5 K/uL 7.4 10.8(H) 6.7  Hemoglobin 12.0 - 15.0 g/dL 11.8(L) 13.3 11.2   Hematocrit 36.0 - 46.0 % 35.2(L) 39.9 35.2  Platelets 150 - 400 K/uL 202 278 212    Lab Results  Component Value Date/Time   VD25OH 31.5 09/01/2020 09:07 AM    Clinical ASCVD: {YES/NO:21197} The 10-year ASCVD risk score Mikey Bussing DC Jr., et al., 2013) is: 34.7%   Values used to calculate the score:     Age: 12 years     Sex: Female     Is Non-Hispanic African American: No     Diabetic: Yes     Tobacco smoker: No     Systolic Blood Pressure: 510 mmHg     Is BP treated: Yes     HDL Cholesterol: 98 mg/dL     Total Cholesterol: 294 mg/dL    Depression screen [facility] 2/9 10/27/2020 09/01/2020 06/17/2019  Decreased Interest 1 1 0  Down, Depressed, Hopeless 1 1 0  PHQ - 2 Score 2 2 0  Altered sleeping $RemoveBeforeDE'1 1 1  'XDEbOSzDbKSTllo$ Tired, decreased energy 1 2 0  Change in appetite $RemoveBef'1 1 1  'KLCHXtSyKF$ Feeling bad or failure about yourself  1 1 0  Trouble concentrating 1 1 0  Moving slowly or fidgety/restless 0 1 0  Suicidal thoughts 0 0 0  PHQ-9 Score $RemoveBef'7 9 2  'YxSPnkSJyz$ Difficult doing work/chores Somewhat difficult Somewhat difficult Not difficult at all  Some recent data might be hidden     ***Other: (CHADS2VASc if Afib, MMRC or CAT for COPD, ACT, DEXA)  Social History   Tobacco Use  Smoking Status Never  Smokeless Tobacco Never   BP Readings from Last 3 Encounters:  11/16/20 (!) 137/49  11/03/20 (!) 156/65  10/27/20 (!) 181/56   Pulse Readings from Last 3 Encounters:  11/16/20 77  11/03/20 63  10/27/20 62   Wt Readings from Last 3 Encounters:  11/15/20 142 lb (64.4 kg)  11/03/20 141 lb (64 kg)  10/27/20 143 lb 6.4 oz (65 kg)   BMI Readings from Last 3 Encounters:  11/15/20 27.73 kg/m  11/03/20 27.54 kg/m  10/27/20 28.01 kg/m    Assessment/Interventions: Review of patient past medical history, allergies, medications, health status, including review of consultants reports, laboratory and other test data, was performed as part of comprehensive evaluation and provision of chronic care management services.    SDOH:  (Social Determinants of Health) assessments and interventions performed: {yes/no:20286}  SDOH Screenings   Alcohol Screen: Low Risk    Last Alcohol Screening Score (AUDIT): 0  Depression (PHQ2-9): Medium Risk   PHQ-2 Score: 7  Financial Resource Strain: Not on file  Food Insecurity: Not on file  Housing: Not on file  Physical Activity: Not on file  Social Connections: Not on file  Stress: Not on file  Tobacco Use: Low Risk    Smoking Tobacco Use: Never   Smokeless Tobacco Use: Never  Transportation Needs: Not on file    CCM Care Plan  Allergies  Allergen Reactions   Doxycycline Other (See Comments)    Emotional changes/anxious   Biaxin [Clarithromycin]     GI upset, and bad taste in mouth.    Medications Reviewed Today     Reviewed by Sherryll Burger, Student-PT (Student-PT) on 01/13/21 at [place]  Med List Status: <None>   Medication Order Taking? Sig Documenting Provider Last Dose Status Informant  azelastine (ASTELIN) 0.1 % nasal spray [id number] No Place 1-2 sprays into both nostrils every 12 (twelve) hours as needed. [provider] prn Active Self  buPROPion (WELLBUTRIN SR) 100 MG 12 hr tablet [id number] No Take 100 mg by mouth 2 (two) times daily. [provider] 11/15/2020 Unknown time Active Self  butalbital-acetaminophen-caffeine (FIORICET) 50-325-40 MG tablet [id number]  Take 1 tablet by mouth every 6 (six) hours as needed for headache. [name], MD  Active   empagliflozin (JARDIANCE) 25 MG TABS tablet [id number] No Take 1 tablet (25 mg total) by mouth daily. [name], MD 11/14/2020 Unknown time Active Self  Esomeprazole Magnesium 20 MG TBEC [id number] No Take 20 mg by mouth daily before breakfast. [provider] 11/15/2020 Unknown time Active Self  ezetimibe (ZETIA) 10 MG tablet [id number] No TAKE 1 TABLET BY MOUTH EVERYDAY AT BEDTIME  Patient taking differently: Take 10 mg by mouth at bedtime.   [name], MD 11/12/2020  Active   fexofenadine (ALLEGRA) 180 MG tablet [id number] No Take 180 mg by mouth daily as needed for allergies or rhinitis. [provider] 11/15/2020 Unknown time Active Self  gabapentin (NEURONTIN) 300 MG capsule [id number]  TAKE 2 CAPSULES (600 MG TOTAL) BY MOUTH 2 (TWO) TIMES DAILY. MORNING & SUPPER [name], MD  Active   glipiZIDE (GLUCOTROL) 10 MG tablet [id number]  TAKE 1 TABLET BY MOUTH EVERY DAY BEFORE BREAKFAST Lelon Huh  E, MD  Active   hydrOXYzine (VISTARIL) 25 MG capsule [id number] No Take 25 mg by mouth daily as needed for anxiety. [provider] 11/15/2020 Unknown time Active Self  irbesartan (AVAPRO) 75 MG tablet [id number]  TAKE 1 TABLET BY MOUTH EVERY DAY [name], MD  Active   Lancet Devices MISC [id number] No  [provider] 11/14/2020 Unknown time Active Self           Med Note Kenton Kingfisher, Earley Favor   Fri Aug 23, 2018 10:57 AM)    metFORMIN (GLUCOPHAGE-XR) 500 MG 24 hr tablet [id number] No Take 2 tablets (1,000 mg total) by mouth daily.  Patient taking differently: Take 500 mg by mouth daily.   [name], MD 11/12/2020 Active   methocarbamol (ROBAXIN) 500 MG tablet [id number]  Take 1 tablet (500 mg total) by mouth every 6 (six) hours as needed for muscle spasms. Deetta Perla, MD  Active   metoprolol-hydrochlorothiazide (LOPRESSOR HCT) 100-25 MG tablet [id number] No Take 0.5 tablets by mouth daily. [name], MD 11/15/2020 0900 Active Self  montelukast (SINGULAIR) 10 MG tablet [id number] No TAKE 1 TABLET BY MOUTH EVERY DAY  Patient taking differently: Take 10 mg by mouth daily as needed (allergies).   [name], MD 11/15/2020 Unknown time Active   Olopatadine HCl 0.7 % SOLN [id number] No Apply 1 drop to eye daily as needed. [provider] 11/14/2020 Unknown time Active Self  ondansetron (ZOFRAN ODT) 4 MG disintegrating tablet [id number] No Take 1 tablet (4 mg total) by mouth every 8 (eight) hours as needed for nausea or  vomiting. Harvest Dark, MD 11/14/2020 Unknown time Active Self  ondansetron (ZOFRAN) 4 MG tablet [id number] No Take 1 tablet (4 mg total) by mouth every 8 (eight) hours as needed. [name], MD 11/15/2020 Unknown time Active   oxyCODONE (OXY IR/ROXICODONE) 5 MG immediate release tablet [id number]  Take 1 tablet (5 mg total) by mouth every 6 (six) hours as needed for moderate pain ((score 4 to 6)). Deetta Perla, MD  Active   rosuvastatin (CRESTOR) 20 MG tablet [id number]  Take 1 tablet (20 mg total) by mouth every evening. [name], MD  Active   senna (SENOKOT) 8.6 MG TABS tablet [id number]  Take 1 tablet (8.6 mg total) by mouth 2 (two) times daily. Deetta Perla, MD  Active   sertraline (ZOLOFT) 100 MG tablet [id number] No Take 200 mg by mouth daily. [provider] 11/15/2020 Unknown time Active Self  traZODone (DESYREL) 50 MG tablet [id number] No Take 50-100 mg by mouth at bedtime as needed for sleep.  [provider] 11/13/2020 Unknown time Active Self  valACYclovir (VALTREX) 1000 MG tablet [id number] No Take 1 tablet daily for 5 days as needed [name], MD prn Active Self            Patient Active Problem List   Diagnosis Date Noted   Cervical myelopathy ([place]) 11/15/2020   LVH (left ventricular hypertrophy) 10/14/2020   COVID-19 05/19/2020   MRSA cellulitis 04/19/2018   Decreased libido 02/04/2018   Uncontrolled type 2 diabetes mellitus with diabetic neuropathy, without long-term current use of insulin ([place]) 04/20/2017   H/O adenomatous polyp of colon 08/20/2015   Rupture of implant of right breast 05/18/2015   Anxiety disorder 04/26/2015   Allergic rhinitis 04/21/2015   Anemia 04/21/2015   Asthma 04/21/2015   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/21/2015   Chronic infection of sinus 04/21/2015   Depression 04/21/2015   GERD (  gastroesophageal reflux disease) 04/21/2015   Headache 04/21/2015   Menopausal symptoms 04/21/2015   Obesity 04/21/2015   Palpitations  04/21/2015   Vitamin B12 deficiency 04/21/2015   Aortic stenosis 82/80/0349   Diastolic dysfunction 17/91/5056   Diabetes mellitus out of control ([place]) 06/12/2006   Barrett esophagus 06/13/2003   Essential hypertension 06/12/1998   Genital herpes 06/12/1998   Hyperlipidemia, mixed 06/12/1998    Immunization History  Administered Date(s) Administered   Hep A / Hep B 02/15/2011, 03/29/2011, 09/06/2011   Influenza, High Dose Seasonal PF 04/18/2017   Influenza,inj,Quad PF,6+ Mos 04/23/2015, 02/17/2016   Influenza-Unspecified 03/25/2019   PFIZER(Purple Top)SARS-COV-2 Vaccination 08/03/2019, 08/27/2019   Pneumococcal Conjugate-13 04/18/2017   Pneumococcal Polysaccharide-23 11/27/2011, 03/29/2018   Tdap 10/09/2007   Zoster, Live 11/27/2011    Conditions to be addressed/monitored:  Hypertension, Hyperlipidemia, Diabetes, GERD, Asthma, Depression, Anxiety, and Allergic Rhinitis  There are no care plans that you recently modified to display for this patient.    Medication Assistance: {MEDASSISTANCEINFO:25044}  Compliance/Adherence/Medication fill history: Care Gaps: ***  Star-Rating Drugs: ***  Patient's preferred pharmacy is:  [id number] [address], [place] [address] Phone: [phone] Fax: [phone]  [id number] [address] [place] [address] [place] [address] Phone: [phone] Fax: [phone]  Uses pill box? {Yes or If no, why not?:20788} Pt endorses ***% compliance  We discussed: {Pharmacy options:24294} Patient decided to: {US Pharmacy Plan:23885}  Care Plan and Follow Up Patient Decision:  {FOLLOWUP:24991}  Plan: {CM FOLLOW UP BRAX:09407}  ***  Current Barriers:  {pharmacybarriers:24917}  Pharmacist Clinical Goal(s):  Patient will {PHARMACYGOALCHOICES:24921} through collaboration with PharmD and provider.   Interventions: 1:1 collaboration with [name], MD regarding development and update of comprehensive plan of care as evidenced by provider attestation and co-signature Inter-disciplinary care team collaboration (see longitudinal plan of care) Comprehensive medication review performed; medication list updated in electronic medical record  Hypertension  (BP goal <140/90) -{US controlled/uncontrolled:25276} -Current treatment: Irbesartan 75 mg daily  Metoprolol-HCTZ 100-25 1/2 tablet daily  -Medications previously tried: ***  -Current home readings: *** -Current dietary habits: *** -Current exercise habits: *** -{ACTIONS;DENIES/REPORTS:21021675} hypotensive/hypertensive symptoms -Educated on {CCM BP Counseling:25124} -Counseled to monitor BP at home ***, document, and provide log at future appointments -{CCMPHARMDINTERVENTION:25122}  Hyperlipidemia: (LDL goal < 70) -Uncontrolled -Current treatment: Ezetimibe 10 mg daily  Rosuvastatin 20 mg nightly  -Medications previously tried: ***  -Current dietary patterns: *** -Current exercise habits: *** -Educated on {CCM HLD Counseling:25126} -{CCMPHARMDINTERVENTION:25122}  Diabetes (A1c goal <8%) -Uncontrolled -Current medications: Jardiance 25 mg daily  Glipizide 10 mg daily  Metformin XR 500 mg daily  -Medications previously tried: NA  -Current home glucose readings fasting glucose: *** post prandial glucose: *** -{ACTIONS;DENIES/REPORTS:21021675} hypoglycemic/hyperglycemic symptoms -Current meal patterns:  breakfast: ***  lunch: ***  dinner: *** snacks: *** drinks: *** -Current exercise: *** -Educated on {CCM DM COUNSELING:25123} -Counseled to check feet daily and get yearly eye exams -{CCMPHARMDINTERVENTION:25122}  Asthma (Goal: control symptoms and prevent exacerbations) -{US controlled/uncontrolled:25276} -Current treatment  Azelastine 0.1% nasal spray Fexofenadine 180 mg daily as needed Montelukast 10 mg daily  -Medications previously tried: ***  -Gold  Grade: {CHL HP Upstream Pharm COPD Gold WKGSU:[id number]} -Current COPD Classification:  {CHL HP Upstream Pharm COPD Classification:[phone]} -MMRC/CAT score: *** -Pulmonary function testing: *** -Exacerbations requiring treatment in last 6 months: *** -Patient {Actions; denies-reports:120008} consistent use of maintenance inhaler -Frequency of rescue inhaler use: *** -Counseled on {CCMINHALERCOUNSELING:25121} -{  CCMPHARMDINTERVENTION:25122}  Depression/Anxiety (Goal: ***) -{US controlled/uncontrolled:25276} -Current treatment: Bupropion SR 100 mg twice daily  Hydroxyzine 25 mg daily as needed Sertraline 100 mg 2 tablets daily  Trazodone 50 mg 1-2 tablets nightly  -Medications previously tried/failed: *** -PHQ9: *** -GAD7: *** -Connected with *** for mental health support -Educated on {CCM mental health counseling:25127} -{CCMPHARMDINTERVENTION:25122}  Patient Goals/Self-Care Activities Patient will:  - {pharmacypatientgoals:24919}  Follow Up Plan: {CM FOLLOW UP JQZE:09233}

## 2021-01-18 ENCOUNTER — Other Ambulatory Visit: Payer: Self-pay | Admitting: Family Medicine

## 2021-01-18 NOTE — Telephone Encounter (Signed)
Requested medications are due for refill today.  yes  Requested medications are on the active medications list.  yes  Last refill. 12/21/2020  Future visit scheduled.   yes  Notes to clinic.  Medication not delegated.

## 2021-01-19 ENCOUNTER — Telehealth: Payer: Self-pay

## 2021-01-19 ENCOUNTER — Ambulatory Visit: Payer: PPO | Admitting: Physical Therapy

## 2021-01-19 NOTE — Telephone Encounter (Signed)
Copied from [place] [phone]. Topic: Appointment Scheduling - Scheduling Inquiry for Clinic >> Jan 17, 2021  5:41 PM Bayard Beaver wrote: Reason for ED:[id number] called in needs to reschedule appt with pharmacist. She had a proble with her phone and missed the call

## 2021-01-19 NOTE — Telephone Encounter (Signed)
Can you please reach out and help me reschedule this patient's initial visit?   Thanks, [name], PharmD, Para [place], [facility] [phone]

## 2021-01-21 ENCOUNTER — Telehealth: Payer: Self-pay | Admitting: *Deleted

## 2021-01-21 NOTE — Chronic Care Management (AMB) (Signed)
  Care Management   Note  01/21/2021 Name: Katie Buckley MRN: [id number] DOB: Mar 02, 1951  Katie Buckley is a 70 y.o. year old female who is a primary care patient of Caryn Section, Kirstie Peri, MD and is actively engaged with the care management team. I reached out to [place] by phone today to assist with re-scheduling an initial visit with the Pharmacist  Follow up plan: Telephone appointment with care management team member scheduled for: 03/04/2021  Julian Hy, [place], [place] Management  Direct Dial: [phone]

## 2021-01-21 NOTE — Chronic Care Management (AMB) (Signed)
  Care Management   Note  01/21/2021 Name: Katie Buckley MRN: [id number] DOB: [birth year]  Katie Buckley is a 70 y.o. year old female who is a primary care patient of Caryn Section, Kirstie Peri, MD and is actively engaged with the care management team. I reached out to [place] by phone today to assist with re-scheduling an initial visit with the Pharmacist  Follow up plan: Unsuccessful telephone outreach attempt made. A HIPAA compliant phone message was left for the patient providing contact information and requesting a return call.   Julian Hy, [place] Management  Direct Dial: [phone]

## 2021-01-24 DIAGNOSIS — F331 Major depressive disorder, recurrent, moderate: Secondary | ICD-10-CM | POA: Diagnosis not present

## 2021-01-24 DIAGNOSIS — F411 Generalized anxiety disorder: Secondary | ICD-10-CM | POA: Diagnosis not present

## 2021-01-25 ENCOUNTER — Ambulatory Visit: Payer: PPO | Admitting: Physical Therapy

## 2021-01-25 ENCOUNTER — Encounter: Payer: Self-pay | Admitting: Physical Therapy

## 2021-01-25 DIAGNOSIS — M6281 Muscle weakness (generalized): Secondary | ICD-10-CM

## 2021-01-25 DIAGNOSIS — M542 Cervicalgia: Secondary | ICD-10-CM

## 2021-01-25 DIAGNOSIS — R296 Repeated falls: Secondary | ICD-10-CM

## 2021-01-25 DIAGNOSIS — G4700 Insomnia, unspecified: Secondary | ICD-10-CM | POA: Diagnosis not present

## 2021-01-25 DIAGNOSIS — R42 Dizziness and giddiness: Secondary | ICD-10-CM

## 2021-01-25 DIAGNOSIS — F331 Major depressive disorder, recurrent, moderate: Secondary | ICD-10-CM | POA: Diagnosis not present

## 2021-01-25 DIAGNOSIS — F411 Generalized anxiety disorder: Secondary | ICD-10-CM | POA: Diagnosis not present

## 2021-01-25 DIAGNOSIS — R2681 Unsteadiness on feet: Secondary | ICD-10-CM | POA: Diagnosis not present

## 2021-01-25 DIAGNOSIS — R41840 Attention and concentration deficit: Secondary | ICD-10-CM | POA: Diagnosis not present

## 2021-01-25 NOTE — Therapy (Signed)
[facility] [address], [place], [address] Phone: [phone]   Fax:  [phone]  Physical Therapy Treatment  Patient Details  Name: Katie Buckley MRN: [id number] Date of Birth: [birth year] Referring Provider (PT): Loleta Dicker, [place]   Encounter Date: 01/25/2021   PT End of Session - 01/25/21 1747     Visit Number 2    Number of Visits 24    Date for PT Re-Evaluation 04/06/21    Authorization Type [employer]: (Reporting period from 01/12/2021)    Progress Note Due on Visit 10    PT Start Time 1515    PT Stop Time 1555    PT Time Calculation (min) 40 min    Equipment Utilized During Treatment Gait belt    Activity Tolerance Patient tolerated treatment well    Behavior During Therapy [facility] for tasks assessed/performed             Past Medical History:  Diagnosis Date   Anxiety    Aortic stenosis, mild    a.) mean gradient 18.7 mmHg on 10/2020 TTE   Depression    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   T2DM (type 2 diabetes mellitus) ([place])     Past Surgical History:  Procedure Laterality Date   ANTERIOR CERVICAL DECOMP/DISCECTOMY FUSION N/A 11/15/2020   Procedure: ANTERIOR CERVICAL DECOMPRESSION/DISCECTOMY FUSION 1 LEVEL C3/4;  Surgeon: Deetta Perla, MD;  Location: [facility] ORS;  Service: Neurosurgery;  Laterality: N/A;   AUGMENTATION MAMMAPLASTY Bilateral    breast implants   Bone Spur  2007   foot   CARDIAC CATHETERIZATION     Carpal Tunnel Symdrome  2008   as repeated in 2009   CATARACT EXTRACTION W/PHACO Left 01/16/2019    Procedure: CATARACT EXTRACTION PHACO AND INTRAOCULAR LENS PLACEMENT ([place]) LEFT DIABETES VISION BLUE;  Surgeon: Marchia Meiers, MD;  Location: [facility] ORS;  Service: Ophthalmology;  Laterality: Left;  [place]  00:47 CDE 6.62 Fluid pack lot # [id number]   CATARACT EXTRACTION W/PHACO Right 02/06/2019   Procedure: CATARACT EXTRACTION PHACO AND INTRAOCULAR LENS PLACEMENT (I[facility]);  Surgeon: Marchia Meiers, MD;  Location: [facility] ORS;  Service: Ophthalmology;  Laterality: Right;  [place] 00:54.6 CDE 6.52 FLUID PACK LOT # [id number]   CHOLECYSTECTOMY  2007   COLONOSCOPY WITH PROPOFOL N/A 11/17/2015   Procedure: COLONOSCOPY WITH PROPOFOL;  Surgeon: Manya Silvas, MD;  Location: [facility] ENDOSCOPY;  Service: Endoscopy;  Laterality: N/A;   ESOPHAGOGASTRODUODENOSCOPY (EGD) WITH PROPOFOL N/A 11/17/2015   Procedure: ESOPHAGOGASTRODUODENOSCOPY (EGD) WITH PROPOFOL;  Surgeon: Manya Silvas, MD;  Location: [facility] ENDOSCOPY;  Service: Endoscopy;  Laterality: N/A;   EYE SURGERY Bilateral    cataract extraction   FOOT SURGERY     MANDIBLE SURGERY     NASAL SINUS SURGERY  03/2013   Multile surgery Dr. Carlis Abbott. 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 Tamala Julian   TONSILLECTOMY  1960    There were no vitals filed for this visit.   Subjective Assessment -  01/25/21 1804     Subjective History obtained from patient and medical record. Patient has a difficult time describing her dizziness symptoms. Pt states she feels wobbly and not very stable. Patient states she had a fall last week when she was trying to carry grocery bags and got off balance. She went to go up the steps and states her "legs just went out from under her" and states she was not dizzy at the time. Patient denies vertigo. Patient describes her dizziness as unsteadiness, lightheadedness, and floating sensation. Patients' symptoms have been going on for over a year but states that her symptoms have increased in frequency this past year. Patient  reports that she has seen Dr. Pryor Ochoa, ENT physician, in regard to her symptoms and describes having had a VNG test. Patient reports that the VNG showed "irregular things" and that she was then referred to [name], neurologist. Patient reports that bending over and return upright, quick turns, head turns all bring on her symptoms of unsteadiness. Patients' dizziness symptoms are motion provoked and intermittent in nature. Patient reports that she veers at times usually to the right when she walks. Patient denies migraines but states that she does have headaches regularly. Patient underwent ACDF surgery 11/15/2020 and states that she has continued to have headaches post-surgery. Patient has history of peripheral neuropathy in bilateral feet per patient report. Patient states that she is planning on speaking with her pharmacist to review her medications to see if any of her medications could be contributing to her symptoms. Patient reports that she has had multiple falls in the past year and states that she fell twice in the shower and once coming out of a store. Patient states that her "body let me know something was going on" and that she ended up falling. Patient reports that she has had bilateral cataract surgery and that she uses reading glasses. Patient states that she has seen her eye doctor in the past year but states that she is planning to make another appointment because she has noticed difficulty with blurred vision and double vision at times. Recommended that patient follow-up with her eye doctor regarding these symptoms.    Pertinent History Patient is a 70 year old female who presents to outpatient physical therapy with a referral for s/p cervical spinal fusion (ANTERIOR CERVICAL DECOMPRESSION/DISCECTOMY FUSION 1 LEVEL C3/4, 11/15/2020), cervicalgia, multifactoral causes for imbalance, cervical myelopathy. This patient's chief complaints consist of imbalance, history of falls, neck pain, and neck  hypomobility, leading to the following functional deficits: difficulty with walking, stairs, lifting items including groceries, balance, bending over, moving her neck, performing ADL's such as getting dressed, showering, bathing. Relevant past medical history and comorbidities include anxiety, aortic stenosis, depression, skin cancer, GERD, heart murmur, hyperchloremia, hypertension, neuropathy, skin cancer, type II diabetes mellitus, asthma, anxiety, left ventricular hypertrophy, diabetic neuropathy and per patient report unexplained weight loss (25-35lbs in past year) and a heart murmur. Patient denies history of stroke, seizure, lung problems, bowel or bladder changes, groin numbness or tingling.    Limitations Lifting;Walking;Standing;House hold activities;Other (comment)   walking, stairs, lifting items including groceries, balance, bending over, moving her neck, performing ADL's such as getting dressed, showering, bathing.   Patient Stated Goals "build up body strength and try to get imbalance under control"    Pain Onset More than a month ago            VESTIBULAR AND BALANCE EVALUATION    HISTORY:   Subjective history of current  problem:  History obtained from patient and medical record. Patient has a difficult time describing her dizziness symptoms. Pt states she feels wobbly and not very stable. Patient states she had a fall last week when she was trying to carry grocery bags and got off balance. She went to go up the steps and states her "legs just went out from under her" and states she was not dizzy at the time. Patient denies vertigo. Patient describes her dizziness as unsteadiness, lightheadedness, and floating sensation. Patients' symptoms have been going on for over a year but states that her symptoms have increased in frequency this past year. Patient reports that she has seen Dr. Pryor Ochoa, ENT physician, in regard to her symptoms and describes having had a VNG test. Patient reports  that the VNG showed "irregular things" and that she was then referred to [name], neurologist. Patient reports that bending over and return upright, quick turns, head turns all bring on her symptoms of unsteadiness. Patients' dizziness symptoms are motion provoked and intermittent in nature. Patient reports that she veers at times usually to the right when she walks. Patient denies migraines but states that she does have headaches regularly. Patient underwent ACDF surgery 11/15/2020 and states that she has continued to have headaches post-surgery. Patient has history of peripheral neuropathy in bilateral feet per patient report. Patient states that she is planning on speaking with her pharmacist to review her medications to see if any of her medications could be contributing to her symptoms. Patient reports that she has had multiple falls in the past year and states that she fell twice in the shower and once coming out of a store. Patient states that her "body let me know something was going on" and that she ended up falling.   Vision (last eye exam, diplopia, recent changes): Patient reports that she has had bilateral cataract surgery and that she uses reading glasses. Patient states that she has seen her eye doctor in the past year but states that she is planning to make another appointment because she has noticed difficulty with blurred vision and double vision at times. Recommended that patient follow-up with her eye doctor regarding these symptoms.      EXAMINATION    Balance:  Patient's balance is challenged by uneven surfaces, narrow base of support, eyes closed, ambulation with head turns and body turn activities.   POSTURAL CONTROL TESTS:   Clinical Test of Sensory Interaction for Balance (CTSIB):    CONDITION  TIME  STRATEGY  SWAY   Eyes open, firm surface  30 seconds  ankle  +2   Eyes closed, firm surface  30 seconds  ankle, hip  +3   Eyes open, foam surface  30 seconds  ankle  +2   Eyes  closed, foam surface  12 seconds  ankle, hip +4     OCULOMOTOR / VESTIBULAR TESTING:  Oculomotor Exam- Room Light    Normal  Abnormal  Comments   Ocular Alignment  N       Ocular ROM  N       Spontaneous Nystagmus  N       Gaze evoked Nystagmus  N       Smooth Pursuit    Abn  Patient reports dizziness; noted multiple corrective saccades.  Saccades  N       VOR    Abn  patient reports unsteadiness and blurring of target  VOR Cancellation    Abn   patient reports unsteadiness and  blurring of target  Left Head Impulse      deferred secondary to ACDF surgery on 11/15/2020  Right Head Impulse      deferred secondary to ACDF surgery on 11/15/2020  Head Shaking Nystagmus       deferred secondary to ACDF surgery on 11/15/2020    BPPV TESTS:  deferred Dix-Hallpike testing secondary to ACDF surgery on 11/15/2020    Neuromuscular-Reeducation:  Ambulation with head and body turns: Patient performed ambulation with horizontal and vertical head turns with CGA. Patient demonstrate significant decrease in cadence and uneven steppage with vertical head turns. In addition, patient reports unsteadiness with vertical head turns.  Patient performed ambulation with quick turns right and left with CGA. Patient reporting unsteadiness at times with quick turn activities.   VOR X 1 exercise: Demonstrated and educated as to how to perform VOR X 1. Patient performed VOR X1 horizontal in sitting multiple 30 second repetitions with verbal and tactile cuing for technique. Patient had difficulty keeping eyes focused on the target and multiple times her eyes would lose fixation on the target to the right side.  Patient reports mild blurring of the target and unsteadiness sensation. Patient recognizes that she is having difficulty with maintaining eye contact on the target.  Issued VOR X1 for home exercise program via [place] with handout provided.   HOME EXERCISE PROGRAM Access Code: [email or web address] URL:  [email or web address] Date: 01/25/2021 Prepared by: Rosita Kea  Exercises  Seated Gaze Stabilization with Head Rotation - 3 x daily - 3 sets - 30 seconds time     PT Education - 01/25/21 1747     Education Details intervention/exercise purpose/form, examination, HEP    Person(s) Educated Patient    Methods Demonstration;Explanation;Tactile cues;Verbal cues;Handout    Comprehension Verbalized understanding;Returned demonstration;Verbal cues required;Tactile cues required;Need further instruction              PT Short Term Goals - 01/13/21 0934       PT SHORT TERM GOAL #1   Title Patient will be independent with inital home exercise program to improve strength/mobility/balance and functional independence with ADLs.    Baseline HEP to be provided at 2nd visit    Time 3    Period Weeks    Status New    Target Date 02/03/21               PT Long Term Goals - 01/13/21 0935       PT LONG TERM GOAL #1   Title Patient will be independent with long term home exercise program to improve strength/mobility/balance and functional independence with ADLs.    Baseline HEP to be provided at 2nd session    Time 12    Period Weeks    Status New   TARGET DATE FOR ALL LONG TERM GOALS: 04/06/2021   Target Date 04/06/21      PT LONG TERM GOAL #2   Title Improve cervical AROM rotation to equal or greater than 60 degrees each direction to improve ability to check blind spot when driving, veiw surroundings, and allow patient to complete valued activities with less difficulty.    Baseline 30 L, 40 R (01/12/2021)    Time 12    Period Weeks    Status New    Target Date 04/06/21      PT LONG TERM GOAL #3   Title Patient will score equal or greater than 25/30 on Functional Gait Assessment to demonstrate reduction in fall risk to low fall risk  classification.    Baseline 11/30 (01/12/2021)    Time 12    Period Weeks    Status New      PT LONG TERM GOAL #4   Title Pt will  increase LE strength of by at least 1/2 MMT grade in order to demonstrate improvement in strength, function, and general mobility    Baseline BIL hip flexion 3+, knee extension 4+ (01/13/2021)      PT LONG TERM GOAL #5   Title Demonstrate improved FOTO score to 53 or greater to demonstrate improvement in overall condition and self-reported functional ability    Baseline 45 (01/10/2021)    Time 12    Period Weeks    Status New                   Plan - 01/25/21 1810     Clinical Impression Statement [name] examined this session by Ilean China, PT, DPT who specializes in vestibular and dizziness disorders. Patient demonstrates signs and symptoms consistent with central vestibular component to her imbalance and does not appear to have vertigo. Patient has difficulty with gaze stability and balance with vertical and horizontal head movements that affect her gait and balance. She did not have replication of feeling she gets during episodes leading to falls during testing today. Patient's safety and functional ability is negatively affected by her limitations and she would benefit from vestibular and balance interventions to decrease symptoms and fall risk. Patient would benefit from continued management of limiting condition by skilled physical therapist to address remaining impairments and functional limitations to work towards stated goals and return to PLOF or maximal functional independence.    Personal Factors and Comorbidities Comorbidity 3+;Past/Current Experience;Time since onset of injury/illness/exacerbation    Comorbidities anxiety, aortic stenosis, depression, skin cancer, GERD, heart murmur, hyperchloremia, hypertension, neuropathy, skin cancer, type II diabetes mellitus, asthma, anxiety, left ventricular hypertrophy, diabetic neuropathy and per patient report unexplained weight loss (25-35lbs in past year) and a heart murmur    Examination-Activity Limitations  Bathing;Bend;Hygiene/Grooming;Dressing;Carry;Lift;Locomotion Level;Stairs;Squat;Stand;Transfers;Other   walking, stairs, lifting items including groceries, balance, bending over, moving her neck, performing ADL's such as getting dressed, showering, bathing.   Examination-Participation Restrictions Driving;Cleaning;Interpersonal Relationship;Yard Work;Community Activity;Laundry;Shop    Stability/Clinical Decision Making Unstable/Unpredictable    Rehab Potential Fair    PT Frequency 2x / week    PT Duration 12 weeks    PT Treatment/Interventions Cryotherapy;ADLs/Self Care Home Management;Moist Heat;Gait training;Stair training;Therapeutic activities;Functional mobility training;Therapeutic exercise;Balance training;Neuromuscular re-education;Patient/family education;Manual techniques;Passive range of motion;Vestibular;Dry needling;Biofeedback;Canalith Repostioning;DME Instruction    PT Next Visit Plan Progress vestibular, strength, and balance activities as tolerated.    PT Home Exercise Plan Medbridge Access Code: [email or web address]    Consulted and Agree with Plan of Care Patient             Patient will benefit from skilled therapeutic intervention in order to improve the following deficits and impairments:  Impaired perceived functional ability, Abnormal gait, Decreased activity tolerance, Decreased range of motion, Decreased strength, Dizziness, Hypomobility, Impaired sensation, Pain, Decreased balance, Decreased mobility, Difficulty walking, Impaired UE functional use  Visit Diagnosis: Unsteadiness on feet  Repeated falls  Cervicalgia  Muscle weakness (generalized)  Dizziness and giddiness     Problem List Patient Active Problem List   Diagnosis Date Noted   Cervical myelopathy ([place]) 11/15/2020   LVH (left ventricular hypertrophy) 10/14/2020   COVID-19 05/19/2020   MRSA cellulitis 04/19/2018   Decreased libido 02/04/2018   Uncontrolled type 2 diabetes mellitus with diabetic  neuropathy, without long-term current use of insulin ([place]) 04/20/2017   H/O adenomatous polyp of colon 08/20/2015   Rupture of implant of right breast 05/18/2015   Anxiety disorder 04/26/2015   Allergic rhinitis 04/21/2015   Anemia 04/21/2015   Asthma 04/21/2015   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/21/2015   Chronic infection of sinus 04/21/2015   Depression 04/21/2015   GERD (gastroesophageal reflux disease) 04/21/2015   Headache 04/21/2015   Menopausal symptoms 04/21/2015   Obesity 04/21/2015   Palpitations 04/21/2015   Vitamin B12 deficiency 04/21/2015   Aortic stenosis 123XX123   Diastolic dysfunction 123XX123   Diabetes mellitus out of control ([place]) 06/12/2006   Barrett esophagus 06/13/2003   Essential hypertension 06/12/1998   Genital herpes 06/12/1998   Hyperlipidemia, mixed 06/12/1998    Everlean Alstrom. Graylon Good, PT, DPT 01/25/21, 6:13 PM   [facility] [address], [place], [address] Phone: [phone]   Fax:  [phone]  Name: ANICIA GRUNDER MRN: [id number] Date of Birth: 1951-04-02

## 2021-01-26 ENCOUNTER — Other Ambulatory Visit: Payer: Self-pay | Admitting: Family Medicine

## 2021-01-26 ENCOUNTER — Telehealth: Payer: Self-pay

## 2021-01-26 ENCOUNTER — Encounter: Payer: Self-pay | Admitting: Family Medicine

## 2021-01-26 DIAGNOSIS — B373 Candidiasis of vulva and vagina: Secondary | ICD-10-CM

## 2021-01-26 DIAGNOSIS — B3731 Acute candidiasis of vulva and vagina: Secondary | ICD-10-CM

## 2021-01-26 MED ORDER — FLUCONAZOLE 150 MG PO TABS
ORAL_TABLET | ORAL | 1 refills | Status: DC
Start: 2021-01-26 — End: 2021-11-02

## 2021-01-26 NOTE — Telephone Encounter (Signed)
Copied from [place] [phone]. Topic: General - Other >> Jan 26, 2021 11:03 AM Yvette Rack wrote: Reason for CRM: Pt stated she had a standing order for a medication for yeast infection but she can not find the Rx number to request through her pharmacy. Pt requests that a Rx for yeast infection medication be sent to [id number]

## 2021-01-26 NOTE — Telephone Encounter (Signed)
Please review

## 2021-01-26 NOTE — Telephone Encounter (Signed)
Patient reports that she get frequent yeast infections due to her prescription medication. Patient states that over the weekend she began noticing a thick milky white like discharge and states that she has had internal/external vaginal itching with irritation and burning at times. Patient denies vaginal odor or pelvic pain. Patient uses [facility] please advise. KW

## 2021-01-31 ENCOUNTER — Other Ambulatory Visit: Payer: Self-pay | Admitting: Family Medicine

## 2021-01-31 ENCOUNTER — Ambulatory Visit: Payer: Self-pay | Admitting: Family Medicine

## 2021-01-31 ENCOUNTER — Ambulatory Visit: Payer: PPO | Admitting: Physical Therapy

## 2021-01-31 ENCOUNTER — Encounter: Payer: Self-pay | Admitting: Physical Therapy

## 2021-01-31 DIAGNOSIS — R2681 Unsteadiness on feet: Secondary | ICD-10-CM | POA: Diagnosis not present

## 2021-01-31 DIAGNOSIS — M6281 Muscle weakness (generalized): Secondary | ICD-10-CM

## 2021-01-31 DIAGNOSIS — R110 Nausea: Secondary | ICD-10-CM

## 2021-01-31 DIAGNOSIS — M542 Cervicalgia: Secondary | ICD-10-CM

## 2021-01-31 DIAGNOSIS — R296 Repeated falls: Secondary | ICD-10-CM

## 2021-01-31 DIAGNOSIS — R42 Dizziness and giddiness: Secondary | ICD-10-CM

## 2021-01-31 NOTE — Therapy (Signed)
[facility] [address], [place], [address] Phone: [phone]   Fax:  [phone]  Physical Therapy Treatment  Patient Details  Name: Katie Buckley MRN: [id number] Date of Birth: 1950/11/24 Referring Provider (PT): Loleta Dicker, [place]   Encounter Date: 01/31/2021   PT End of Session - 01/31/21 1003     Visit Number 3    Number of Visits 24    Date for PT Re-Evaluation 04/06/21    Authorization Type [employer]: (Reporting period from 01/12/2021)    Progress Note Due on Visit 10    PT Start Time 0905    PT Stop Time 0945    PT Time Calculation (min) 40 min    Activity Tolerance Patient tolerated treatment well    Behavior During Therapy [facility] for tasks assessed/performed             Past Medical History:  Diagnosis Date   Anxiety    Aortic stenosis, mild    a.) mean gradient 18.7 mmHg on 10/2020 TTE   Depression    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   T2DM (type 2 diabetes mellitus) ([place])     Past Surgical History:  Procedure Laterality Date   ANTERIOR CERVICAL DECOMP/DISCECTOMY FUSION N/A 11/15/2020   Procedure: ANTERIOR CERVICAL DECOMPRESSION/DISCECTOMY FUSION 1 LEVEL C3/4;  Surgeon: Deetta Perla, MD;  Location: [facility] ORS;  Service: Neurosurgery;  Laterality: N/A;   AUGMENTATION MAMMAPLASTY Bilateral    breast implants   Bone Spur  2007   foot   CARDIAC CATHETERIZATION     Carpal Tunnel Symdrome  2008   as repeated in 2009   CATARACT EXTRACTION W/PHACO Left 01/16/2019   Procedure: CATARACT EXTRACTION PHACO AND INTRAOCULAR LENS  PLACEMENT ([place]) LEFT DIABETES VISION BLUE;  Surgeon: Marchia Meiers, MD;  Location: [facility] ORS;  Service: Ophthalmology;  Laterality: Left;  [place]  00:47 CDE 6.62 Fluid pack lot # [id number]   CATARACT EXTRACTION W/PHACO Right 02/06/2019   Procedure: CATARACT EXTRACTION PHACO AND INTRAOCULAR LENS PLACEMENT (IOC);  Surgeon: Marchia Meiers, MD;  Location: [facility] ORS;  Service: Ophthalmology;  Laterality: Right;  [place] 00:54.6 CDE 6.52 FLUID PACK LOT # [id number]   CHOLECYSTECTOMY  2007   COLONOSCOPY WITH PROPOFOL N/A 11/17/2015   Procedure: COLONOSCOPY WITH PROPOFOL;  Surgeon: Manya Silvas, MD;  Location: [facility] ENDOSCOPY;  Service: Endoscopy;  Laterality: N/A;   ESOPHAGOGASTRODUODENOSCOPY (EGD) WITH PROPOFOL N/A 11/17/2015   Procedure: ESOPHAGOGASTRODUODENOSCOPY (EGD) WITH PROPOFOL;  Surgeon: Manya Silvas, MD;  Location: [facility] ENDOSCOPY;  Service: Endoscopy;  Laterality: N/A;   EYE SURGERY Bilateral    cataract extraction   FOOT SURGERY     MANDIBLE SURGERY     NASAL SINUS SURGERY  03/2013   Multile surgery Dr. Carlis Abbott. 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 Tamala Julian   TONSILLECTOMY  1960    There were no vitals filed for this visit.   Subjective Assessment - 01/31/21 0907     Subjective Patient reports  she is continuing to have some trouble with her neck. She thinks it is odd she has pain on the right side of the neck and not the left. She currently has 5/10 constant pain. Patient reports she has not had any falls since last PT session. She has been practicing her HEP but doesn't feel like she is doing very well with it because she can't seem to stay focused on the X when she turns her head to the right. The left is better..    Pertinent History Patient is a 70 year old female who presents to outpatient physical therapy with a referral for s/p cervical spinal fusion (ANTERIOR CERVICAL DECOMPRESSION/DISCECTOMY FUSION 1 LEVEL C3/4, 11/15/2020), cervicalgia, multifactoral  causes for imbalance, cervical myelopathy. This patient's chief complaints consist of imbalance, history of falls, neck pain, and neck hypomobility, leading to the following functional deficits: difficulty with walking, stairs, lifting items including groceries, balance, bending over, moving her neck, performing ADL's such as getting dressed, showering, bathing. Relevant past medical history and comorbidities include anxiety, aortic stenosis, depression, skin cancer, GERD, heart murmur, hyperchloremia, hypertension, neuropathy, skin cancer, type II diabetes mellitus, asthma, anxiety, left ventricular hypertrophy, diabetic neuropathy and per patient report unexplained weight loss (25-35lbs in past year) and a heart murmur. Patient denies history of stroke, seizure, lung problems, bowel or bladder changes, groin numbness or tingling.    Limitations Lifting;Walking;Standing;House hold activities;Other (comment)   walking, stairs, lifting items including groceries, balance, bending over, moving her neck, performing ADL's such as getting dressed, showering, bathing.   Patient Stated Goals "build up body strength and try to get imbalance under control"    Currently in Pain? Yes    Pain Score 5     Pain Location Neck    Pain Orientation Right    Pain Onset More than a month ago              TREATMENT:   Manual therapy: to reduce pain and tissue tension, improve range of motion, neuromodulation, in order to promote improved ability to complete functional activities. HOOKLYING - STM with TrP release to posterior cervical spine musculature with focus on tight and tender regions at R mid to lower cervical spine.   Therapeutic exercise: to centralize symptoms and improve ROM, strength, muscular endurance, and activity tolerance required for successful completion of functional activities.  - hooklying AROM cervical rotation with deflated ball 1x20 each side for gentle stretch.  - supine <> sit to get into  position for manual therapy and exercises. (Feels like she is spinning when going supine to sit).  - seated cervical retraction 1x5 (discontinued due to pain at right lower neck, improved but still present with improved form, and then increased pain up the back of the right side of the head towards the eye).  - Education on HEP including handout   Neuromuscular Re-education: to improve, balance, postural strength, muscle activation patterns, and stabilization strength required for functional activities: - seated VOR X 1, 3x30-60 seconds with cuing for decreased ROM to improve focus and keep eye from pulling off target. Target placed in front of patient on door.  - education on vestibular system and contribution to balance.   HOME EXERCISE PROGRAM Access Code: [email or web address] URL: [email or web address] Date: 01/31/2021 Prepared by: Rosita Kea  Exercises Seated Gaze Stabilization with Head Rotation - 3 x daily - 3 sets - 30 seconds time Supine Cervical Rotation AROM on Pillow - 1-2 x daily - 1 sets - 20 reps - 2  seconds hold    PT Education - 01/31/21 1003     Education Details intervention/exercise purpose/form, examination, HEP    Person(s) Educated Patient    Methods Explanation;Demonstration;Tactile cues;Verbal cues;Handout    Comprehension Verbalized understanding;Returned demonstration;Verbal cues required;Tactile cues required;Need further instruction              PT Short Term Goals - 01/13/21 0934       PT SHORT TERM GOAL #1   Title Patient will be independent with inital home exercise program to improve strength/mobility/balance and functional independence with ADLs.    Baseline HEP to be provided at 2nd visit    Time 3    Period Weeks    Status New    Target Date 02/03/21               PT Long Term Goals - 01/13/21 0935       PT LONG TERM GOAL #1   Title Patient will be independent with long term home exercise program to improve  strength/mobility/balance and functional independence with ADLs.    Baseline HEP to be provided at 2nd session    Time 12    Period Weeks    Status New   TARGET DATE FOR ALL LONG TERM GOALS: 04/06/2021   Target Date 04/06/21      PT LONG TERM GOAL #2   Title Improve cervical AROM rotation to equal or greater than 60 degrees each direction to improve ability to check blind spot when driving, veiw surroundings, and allow patient to complete valued activities with less difficulty.    Baseline 30 L, 40 R (01/12/2021)    Time 12    Period Weeks    Status New    Target Date 04/06/21      PT LONG TERM GOAL #3   Title Patient will score equal or greater than 25/30 on Functional Gait Assessment to demonstrate reduction in fall risk to low fall risk classification.    Baseline 11/30 (01/12/2021)    Time 12    Period Weeks    Status New      PT LONG TERM GOAL #4   Title Pt will increase LE strength of by at least 1/2 MMT grade in order to demonstrate improvement in strength, function, and general mobility    Baseline BIL hip flexion 3+, knee extension 4+ (01/13/2021)      PT LONG TERM GOAL #5   Title Demonstrate improved FOTO score to 53 or greater to demonstrate improvement in overall condition and self-reported functional ability    Baseline 45 (01/10/2021)    Time 12    Period Weeks    Status New                   Plan - 01/31/21 1000     Clinical Impression Statement Patient tolerated treatment well overall but does report onset of R sided headache arcing from back of neck towards R eye that came on with seated cervical retraction. Neck pain reduced to 1-2/10 with manual therapy and patient reported feeling less tight in the right side of the neck. Did need some cuing for improved form during VOR X1 exercise for improved effectiveness of HEP. Plan to address sub-occipital region with manual therapy and attempt chin tuck in supine next session as appropriate. Patient continues to have  headaches, neck pain, decreased cervical spine ROM, and vestibular deficits that are negatively affecting her functional mobility and increases her risk of falls. Continue to  recommend follow up with neurologist, neurosurgeon, and eye doctor. Patient remains frustrated that she does not know why she is having falling spells. Patient would benefit from continued management of limiting condition by skilled physical therapist to address remaining impairments and functional limitations to work towards stated goals and return to PLOF or maximal functional independence.    Personal Factors and Comorbidities Comorbidity 3+;Past/Current Experience;Time since onset of injury/illness/exacerbation    Comorbidities anxiety, aortic stenosis, depression, skin cancer, GERD, heart murmur, hyperchloremia, hypertension, neuropathy, skin cancer, type II diabetes mellitus, asthma, anxiety, left ventricular hypertrophy, diabetic neuropathy and per patient report unexplained weight loss (25-35lbs in past year) and a heart murmur    Examination-Activity Limitations Bathing;Bend;Hygiene/Grooming;Dressing;Carry;Lift;Locomotion Level;Stairs;Squat;Stand;Transfers;Other   walking, stairs, lifting items including groceries, balance, bending over, moving her neck, performing ADL's such as getting dressed, showering, bathing.   Examination-Participation Restrictions Driving;Cleaning;Interpersonal Relationship;Yard Work;Community Activity;Laundry;Shop    Stability/Clinical Decision Making Unstable/Unpredictable    Rehab Potential Fair    PT Frequency 2x / week    PT Duration 12 weeks    PT Treatment/Interventions Cryotherapy;ADLs/Self Care Home Management;Moist Heat;Gait training;Stair training;Therapeutic activities;Functional mobility training;Therapeutic exercise;Balance training;Neuromuscular re-education;Patient/family education;Manual techniques;Passive range of motion;Vestibular;Dry needling;Biofeedback;Canalith Repostioning;DME  Instruction    PT Next Visit Plan Progress vestibular, strength, and balance activities as tolerated.    PT Home Exercise Plan Medbridge Access Code: [email or web address]    Consulted and Agree with Plan of Care Patient             Patient will benefit from skilled therapeutic intervention in order to improve the following deficits and impairments:  Impaired perceived functional ability, Abnormal gait, Decreased activity tolerance, Decreased range of motion, Decreased strength, Dizziness, Hypomobility, Impaired sensation, Pain, Decreased balance, Decreased mobility, Difficulty walking, Impaired UE functional use  Visit Diagnosis: Unsteadiness on feet  Repeated falls  Cervicalgia  Muscle weakness (generalized)  Dizziness and giddiness     Problem List Patient Active Problem List   Diagnosis Date Noted   Cervical myelopathy ([place]) 11/15/2020   LVH (left ventricular hypertrophy) 10/14/2020   COVID-19 05/19/2020   MRSA cellulitis 04/19/2018   Decreased libido 02/04/2018   Uncontrolled type 2 diabetes mellitus with diabetic neuropathy, without long-term current use of insulin ([place]) 04/20/2017   H/O adenomatous polyp of colon 08/20/2015   Rupture of implant of right breast 05/18/2015   Anxiety disorder 04/26/2015   Allergic rhinitis 04/21/2015   Anemia 04/21/2015   Asthma 04/21/2015   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/21/2015   Chronic infection of sinus 04/21/2015   Depression 04/21/2015   GERD (gastroesophageal reflux disease) 04/21/2015   Headache 04/21/2015   Menopausal symptoms 04/21/2015   Obesity 04/21/2015   Palpitations 04/21/2015   Vitamin B12 deficiency 04/21/2015   Aortic stenosis 123XX123   Diastolic dysfunction 123XX123   Diabetes mellitus out of control ([place]) 06/12/2006   Barrett esophagus 06/13/2003   Essential hypertension 06/12/1998   Genital herpes 06/12/1998   Hyperlipidemia, mixed 06/12/1998    Everlean Alstrom. Graylon Good, PT, DPT 01/31/21, 10:04 AM  [facility] [address], [place], [address] Phone: [phone]   Fax:  [phone]  Name: ADISON BULLION MRN: [id number] Date of Birth: [birth year]

## 2021-01-31 NOTE — Telephone Encounter (Signed)
Requested medications are due for refill today.  yes  Requested medications are on the active medications list.  Yes  Last refill. 09/29/2020 & 09/06/2020  Future visit scheduled.   yes  Notes to clinic.  Patient has 2 prescriptions for this medication. Medication not delegated.

## 2021-02-01 DIAGNOSIS — Z981 Arthrodesis status: Secondary | ICD-10-CM | POA: Diagnosis not present

## 2021-02-01 DIAGNOSIS — M50323 Other cervical disc degeneration at C6-C7 level: Secondary | ICD-10-CM | POA: Diagnosis not present

## 2021-02-01 DIAGNOSIS — M4322 Fusion of spine, cervical region: Secondary | ICD-10-CM | POA: Diagnosis not present

## 2021-02-02 ENCOUNTER — Ambulatory Visit: Payer: PPO | Admitting: Physical Therapy

## 2021-02-03 ENCOUNTER — Other Ambulatory Visit: Payer: Self-pay

## 2021-02-03 ENCOUNTER — Ambulatory Visit: Payer: PPO | Admitting: Physical Therapy

## 2021-02-03 DIAGNOSIS — R42 Dizziness and giddiness: Secondary | ICD-10-CM

## 2021-02-03 DIAGNOSIS — R296 Repeated falls: Secondary | ICD-10-CM

## 2021-02-03 DIAGNOSIS — M542 Cervicalgia: Secondary | ICD-10-CM

## 2021-02-03 DIAGNOSIS — R2681 Unsteadiness on feet: Secondary | ICD-10-CM

## 2021-02-03 DIAGNOSIS — M6281 Muscle weakness (generalized): Secondary | ICD-10-CM

## 2021-02-03 NOTE — Therapy (Signed)
[facility] [address], [place], [address] Phone: [phone]   Fax:  [phone]  Physical Therapy Treatment  Patient Details  Name: Katie Buckley MRN: [id number] Date of Birth: [birth year] Referring Provider (PT): Loleta Dicker, [place]   Encounter Date: 02/03/2021   PT End of Session - 02/03/21 1709     Visit Number 4    Number of Visits 24    Date for PT Re-Evaluation 04/06/21    Authorization Type [employer]: (Reporting period from 01/12/2021)    Progress Note Due on Visit 10    PT Start Time 1645    PT Stop Time 1725    PT Time Calculation (min) 40 min    Activity Tolerance Patient tolerated treatment well    Behavior During Therapy [facility] for tasks assessed/performed             Past Medical History:  Diagnosis Date   Anxiety    Aortic stenosis, mild    a.) mean gradient 18.7 mmHg on 10/2020 TTE   Depression    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   T2DM (type 2 diabetes mellitus) ([place])     Past Surgical History:  Procedure Laterality Date   ANTERIOR CERVICAL DECOMP/DISCECTOMY FUSION N/A 11/15/2020   Procedure: ANTERIOR CERVICAL DECOMPRESSION/DISCECTOMY FUSION 1 LEVEL C3/4;  Surgeon: Deetta Perla, MD;  Location: [facility] ORS;  Service: Neurosurgery;  Laterality: N/A;   AUGMENTATION MAMMAPLASTY Bilateral    breast implants   Bone Spur  2007   foot   CARDIAC CATHETERIZATION     Carpal Tunnel Symdrome  2008   as repeated in 2009   CATARACT EXTRACTION W/PHACO Left 01/16/2019   Procedure: CATARACT EXTRACTION PHACO AND INTRAOCULAR LENS  PLACEMENT ([facility]) LEFT DIABETES VISION BLUE;  Surgeon: Marchia Meiers, MD;  Location: [facility] ORS;  Service: Ophthalmology;  Laterality: Left;  [place]  00:47 CDE 6.62 Fluid pack lot # [id number]   CATARACT EXTRACTION W/PHACO Right 02/06/2019   Procedure: CATARACT EXTRACTION PHACO AND INTRAOCULAR LENS PLACEMENT (IOC);  Surgeon: Marchia Meiers, MD;  Location: [facility] ORS;  Service: Ophthalmology;  Laterality: Right;  [place] 00:54.6 CDE 6.52 FLUID PACK LOT # [id number]   CHOLECYSTECTOMY  2007   COLONOSCOPY WITH PROPOFOL N/A 11/17/2015   Procedure: COLONOSCOPY WITH PROPOFOL;  Surgeon: Manya Silvas, MD;  Location: [facility] ENDOSCOPY;  Service: Endoscopy;  Laterality: N/A;   ESOPHAGOGASTRODUODENOSCOPY (EGD) WITH PROPOFOL N/A 11/17/2015   Procedure: ESOPHAGOGASTRODUODENOSCOPY (EGD) WITH PROPOFOL;  Surgeon: Manya Silvas, MD;  Location: [facility] ENDOSCOPY;  Service: Endoscopy;  Laterality: N/A;   EYE SURGERY Bilateral    cataract extraction   FOOT SURGERY     MANDIBLE SURGERY     NASAL SINUS SURGERY  03/2013   Multile surgery Dr. Carlis Abbott. 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 Tamala Julian   TONSILLECTOMY  1960    There were no vitals filed for this visit.   Subjective Assessment - 02/03/21 1650     Subjective Patient reports  she hurt the entire next day after she was at PT. She does think the manual therapy helped her in the long run but it did increase her pain the next day. Today she does not have a headache and has not had any falls since last PT session. She saw her neurosurgeon Dr. Lacinda Axon since last PT session. He did x-rays and said her surgical recovery is progressing nicely. As far of the dizziness and falling he referred her back to PCP but patient states she has already seen PCP about dizziness and falling and he sent her to [name]. She has an appt with PCP next week and then a couple weeks later with [name] that she might ask to see sooner. She almost fell today in the shower  but she just grabbed ahold of the bar and was ok. She feels a bit sick to her stomach today that she says is related to personal things in her life and it is common for her to feel this way as a result.    Pertinent History Patient is a 70 year old female who presents to outpatient physical therapy with a referral for s/p cervical spinal fusion (ANTERIOR CERVICAL DECOMPRESSION/DISCECTOMY FUSION 1 LEVEL C3/4, 11/15/2020), cervicalgia, multifactoral causes for imbalance, cervical myelopathy. This patient's chief complaints consist of imbalance, history of falls, neck pain, and neck hypomobility, leading to the following functional deficits: difficulty with walking, stairs, lifting items including groceries, balance, bending over, moving her neck, performing ADL's such as getting dressed, showering, bathing. Relevant past medical history and comorbidities include anxiety, aortic stenosis, depression, skin cancer, GERD, heart murmur, hyperchloremia, hypertension, neuropathy, skin cancer, type II diabetes mellitus, asthma, anxiety, left ventricular hypertrophy, diabetic neuropathy and per patient report unexplained weight loss (25-35lbs in past year) and a heart murmur. Patient denies history of stroke, seizure, lung problems, bowel or bladder changes, groin numbness or tingling.    Limitations Lifting;Walking;Standing;House hold activities;Other (comment)   walking, stairs, lifting items including groceries, balance, bending over, moving her neck, performing ADL's such as getting dressed, showering, bathing.   Patient Stated Goals "build up body strength and try to get imbalance under control"    Currently in Pain? No/denies    Pain Onset More than a month ago               TREATMENT:    Manual therapy: to reduce pain and tissue tension, improve range of motion, neuromodulation, in order to promote improved ability to complete functional activities. SUPINE  - STM to posterior cervical spine musculature  focusing on the tender portions of the suboccipital region, R > L.   Therapeutic exercise: to centralize symptoms and improve ROM, strength, muscular endurance, and activity tolerance required for successful completion of functional activities.  - supine chin tuck, 1x20, 5 second holds.  - supine AROM cervical rotation with deflated ball 1x20 each side for gentle stretch.  - supine <> sit to get into position for manual therapy and exercises. (Feels like she is spinning when going supine to sit).  - seated cervical retraction 1x20 (significant multimodal cuing required).  - Education on HEP including handout    Neuromuscular Re-education: to improve, balance, postural strength, muscle activation patterns, and stabilization strength required for functional activities: - seated VOR X 1, 3x60 seconds with 1 min break between sets. Cuing to keep target in focus while moving as fast as possible. Target placed in front of patient on door.  - walking with quick turns, SBA, to  improve balance and equilibrium - static balance: standing on airex self-selected stance x 1 min, airex with narrow stance x 1 min, airex with narrow stance and slight head turns while focusing on number on clock face across room. CGA for safety and intermittent touchdown UE support on TM bar.    HOME EXERCISE PROGRAM Access Code: [email or web address] URL: [email or web address] Date: 02/03/2021 Prepared by: Rosita Kea  Exercises Seated Gaze Stabilization with Head Rotation - 3 x daily - 3 sets - 30 seconds time Seated Cervical Retraction - 3 x daily - 1 sets - 20 reps - 1 second hold Supine Cervical Rotation AROM on Pillow - 1-2 x daily - 1 sets - 20 reps - 2 seconds hold Supine Chin Tuck - 1 x daily - 1 sets - 20 reps - 5 seconds hold   PT Education - 02/03/21 1709     Education Details intervention/exercise purpose/form    Person(s) Educated Patient    Methods Explanation;Demonstration;Tactile cues;Verbal cues     Comprehension Verbalized understanding;Returned demonstration;Verbal cues required;Tactile cues required;Need further instruction              PT Short Term Goals - 01/13/21 0934       PT SHORT TERM GOAL #1   Title Patient will be independent with inital home exercise program to improve strength/mobility/balance and functional independence with ADLs.    Baseline HEP to be provided at 2nd visit    Time 3    Period Weeks    Status New    Target Date 02/03/21               PT Long Term Goals - 01/13/21 0935       PT LONG TERM GOAL #1   Title Patient will be independent with long term home exercise program to improve strength/mobility/balance and functional independence with ADLs.    Baseline HEP to be provided at 2nd session    Time 12    Period Weeks    Status New   TARGET DATE FOR ALL LONG TERM GOALS: 04/06/2021   Target Date 04/06/21      PT LONG TERM GOAL #2   Title Improve cervical AROM rotation to equal or greater than 60 degrees each direction to improve ability to check blind spot when driving, veiw surroundings, and allow patient to complete valued activities with less difficulty.    Baseline 30 L, 40 R (01/12/2021)    Time 12    Period Weeks    Status New    Target Date 04/06/21      PT LONG TERM GOAL #3   Title Patient will score equal or greater than 25/30 on Functional Gait Assessment to demonstrate reduction in fall risk to low fall risk classification.    Baseline 11/30 (01/12/2021)    Time 12    Period Weeks    Status New      PT LONG TERM GOAL #4   Title Pt will increase LE strength of by at least 1/2 MMT grade in order to demonstrate improvement in strength, function, and general mobility    Baseline BIL hip flexion 3+, knee extension 4+ (01/13/2021)      PT LONG TERM GOAL #5   Title Demonstrate improved FOTO score to 53 or greater to demonstrate improvement in overall condition and self-reported functional ability    Baseline 45 (01/10/2021)    Time  12    Period Weeks    Status New  Plan - 02/03/21 1749     Clinical Impression Statement Patient tolerated treatment well overall and was able to advance to more dynamic and standing balance/vestibular exercises. Continues to have difficulty keeping eyes on target, especially with left head turn. Also used manual therapy to attempt to address source of right sided headaches. Suboccipital region had increased tenderness at the right side. Patient able to perform exercises for upper cervical spine and posture with improved tolerance today after manual therapy so updated HEP to include them. Patient continues to demonstrate evidence of vestibular dysfunction and increased fall risk. Patient would benefit from continued management of limiting condition by skilled physical therapist to address remaining impairments and functional limitations to work towards stated goals and return to PLOF or maximal functional independence.    Personal Factors and Comorbidities Comorbidity 3+;Past/Current Experience;Time since onset of injury/illness/exacerbation    Comorbidities anxiety, aortic stenosis, depression, skin cancer, GERD, heart murmur, hyperchloremia, hypertension, neuropathy, skin cancer, type II diabetes mellitus, asthma, anxiety, left ventricular hypertrophy, diabetic neuropathy and per patient report unexplained weight loss (25-35lbs in past year) and a heart murmur    Examination-Activity Limitations Bathing;Bend;Hygiene/Grooming;Dressing;Carry;Lift;Locomotion Level;Stairs;Squat;Stand;Transfers;Other   walking, stairs, lifting items including groceries, balance, bending over, moving her neck, performing ADL's such as getting dressed, showering, bathing.   Examination-Participation Restrictions Driving;Cleaning;Interpersonal Relationship;Yard Work;Community Activity;Laundry;Shop    Stability/Clinical Decision Making Unstable/Unpredictable    Rehab Potential Fair    PT Frequency  2x / week    PT Duration 12 weeks    PT Treatment/Interventions Cryotherapy;ADLs/Self Care Home Management;Moist Heat;Gait training;Stair training;Therapeutic activities;Functional mobility training;Therapeutic exercise;Balance training;Neuromuscular re-education;Patient/family education;Manual techniques;Passive range of motion;Vestibular;Dry needling;Biofeedback;Canalith Repostioning;DME Instruction    PT Next Visit Plan Progress vestibular, strength, and balance activities as tolerated.    PT Home Exercise Plan Medbridge Access Code: [email or web address]    Consulted and Agree with Plan of Care Patient             Patient will benefit from skilled therapeutic intervention in order to improve the following deficits and impairments:  Impaired perceived functional ability, Abnormal gait, Decreased activity tolerance, Decreased range of motion, Decreased strength, Dizziness, Hypomobility, Impaired sensation, Pain, Decreased balance, Decreased mobility, Difficulty walking, Impaired UE functional use  Visit Diagnosis: Unsteadiness on feet  Repeated falls  Cervicalgia  Muscle weakness (generalized)  Dizziness and giddiness     Problem List Patient Active Problem List   Diagnosis Date Noted   Cervical myelopathy ([place]) 11/15/2020   LVH (left ventricular hypertrophy) 10/14/2020   COVID-19 05/19/2020   MRSA cellulitis 04/19/2018   Decreased libido 02/04/2018   Uncontrolled type 2 diabetes mellitus with diabetic neuropathy, without long-term current use of insulin ([place]) 04/20/2017   H/O adenomatous polyp of colon 08/20/2015   Rupture of implant of right breast 05/18/2015   Anxiety disorder 04/26/2015   Allergic rhinitis 04/21/2015   Anemia 04/21/2015   Asthma 04/21/2015   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/21/2015   Chronic infection of sinus 04/21/2015   Depression 04/21/2015   GERD (gastroesophageal reflux disease) 04/21/2015   Headache 04/21/2015   Menopausal symptoms 04/21/2015   Obesity  04/21/2015   Palpitations 04/21/2015   Vitamin B12 deficiency 04/21/2015   Aortic stenosis 123XX123   Diastolic dysfunction 123XX123   Diabetes mellitus out of control ([place]) 06/12/2006   Barrett esophagus 06/13/2003   Essential hypertension 06/12/1998   Genital herpes 06/12/1998   Hyperlipidemia, mixed 06/12/1998    Everlean Alstrom. Graylon Good, PT, DPT 02/03/21, 5:50 PM   [facility] [address] [place], [address] Phone: [phone]   Fax:  [phone]  Name: Katie Buckley MRN: [id number] Date of Birth: 03-08-1951

## 2021-02-07 ENCOUNTER — Ambulatory Visit: Payer: PPO | Admitting: Physical Therapy

## 2021-02-08 ENCOUNTER — Encounter: Payer: Self-pay | Admitting: Physical Therapy

## 2021-02-08 ENCOUNTER — Ambulatory Visit: Payer: PPO

## 2021-02-08 DIAGNOSIS — M6281 Muscle weakness (generalized): Secondary | ICD-10-CM

## 2021-02-08 DIAGNOSIS — R296 Repeated falls: Secondary | ICD-10-CM

## 2021-02-08 DIAGNOSIS — M542 Cervicalgia: Secondary | ICD-10-CM

## 2021-02-08 DIAGNOSIS — R2681 Unsteadiness on feet: Secondary | ICD-10-CM

## 2021-02-08 NOTE — Therapy (Signed)
[facility] [address], [place], [address] Phone: [phone]   Fax:  [phone]  Physical Therapy Treatment  Patient Details  Name: Katie Buckley MRN: [id number] Date of Birth: 03-Dec-1950 Referring Provider (PT): Loleta Dicker, [place]   Encounter Date: 02/08/2021   PT End of Session - 02/08/21 1608     Visit Number 5    Number of Visits 24    Date for PT Re-Evaluation 04/06/21    Authorization Type [employer]: (Reporting period from 01/12/2021)    Progress Note Due on Visit 10    PT Start Time 1602    PT Stop Time 1646    PT Time Calculation (min) 44 min    Activity Tolerance Patient tolerated treatment well    Behavior During Therapy [facility] for tasks assessed/performed             Past Medical History:  Diagnosis Date   Anxiety    Aortic stenosis, mild    a.) mean gradient 18.7 mmHg on 10/2020 TTE   Depression    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   T2DM (type 2 diabetes mellitus) ([place])     Past Surgical History:  Procedure Laterality Date   ANTERIOR CERVICAL DECOMP/DISCECTOMY FUSION N/A 11/15/2020   Procedure: ANTERIOR CERVICAL DECOMPRESSION/DISCECTOMY FUSION 1 LEVEL C3/4;  Surgeon: Deetta Perla, MD;  Location: [facility] ORS;  Service: Neurosurgery;  Laterality: N/A;   AUGMENTATION MAMMAPLASTY Bilateral    breast implants   Bone Spur  2007   foot   CARDIAC CATHETERIZATION     Carpal Tunnel Symdrome  2008   as repeated in 2009   CATARACT EXTRACTION W/PHACO Left 01/16/2019   Procedure: CATARACT EXTRACTION PHACO AND INTRAOCULAR LENS  PLACEMENT ([place]) LEFT DIABETES VISION BLUE;  Surgeon: Marchia Meiers, MD;  Location: [facility] ORS;  Service: Ophthalmology;  Laterality: Left;  [place]  00:47 CDE 6.62 Fluid pack lot # [id number]   CATARACT EXTRACTION W/PHACO Right 02/06/2019   Procedure: CATARACT EXTRACTION PHACO AND INTRAOCULAR LENS PLACEMENT (IOC);  Surgeon: Marchia Meiers, MD;  Location: [facility] ORS;  Service: Ophthalmology;  Laterality: Right;  [place] 00:54.6 CDE 6.52 FLUID PACK LOT # [id number]   CHOLECYSTECTOMY  2007   COLONOSCOPY WITH PROPOFOL N/A 11/17/2015   Procedure: COLONOSCOPY WITH PROPOFOL;  Surgeon: Manya Silvas, MD;  Location: [facility] ENDOSCOPY;  Service: Endoscopy;  Laterality: N/A;   ESOPHAGOGASTRODUODENOSCOPY (EGD) WITH PROPOFOL N/A 11/17/2015   Procedure: ESOPHAGOGASTRODUODENOSCOPY (EGD) WITH PROPOFOL;  Surgeon: Manya Silvas, MD;  Location: [facility] ENDOSCOPY;  Service: Endoscopy;  Laterality: N/A;   EYE SURGERY Bilateral    cataract extraction   FOOT SURGERY     MANDIBLE SURGERY     NASAL SINUS SURGERY  03/2013   Multile surgery Dr. Carlis Abbott. 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 Tamala Julian   TONSILLECTOMY  1960    There were no vitals filed for this visit.   Subjective Assessment - 02/08/21 1604     Subjective Pt reports  stiffness in R side of cervical spine. No pain. 2 near falls since last PT visit. Describes these occur moving quickly trying to turn. Still reporting "spells" where she feels like she has to hold onto things due to LE's buckling but that is not a common factor.    Pertinent History Patient is a 70 year old female who presents to outpatient physical therapy with a referral for s/p cervical spinal fusion (ANTERIOR CERVICAL DECOMPRESSION/DISCECTOMY FUSION 1 LEVEL C3/4, 11/15/2020), cervicalgia, multifactoral causes for imbalance, cervical myelopathy. This patient's chief complaints consist of imbalance, history of falls, neck pain, and neck hypomobility, leading to the  following functional deficits: difficulty with walking, stairs, lifting items including groceries, balance, bending over, moving her neck, performing ADL's such as getting dressed, showering, bathing. Relevant past medical history and comorbidities include anxiety, aortic stenosis, depression, skin cancer, GERD, heart murmur, hyperchloremia, hypertension, neuropathy, skin cancer, type II diabetes mellitus, asthma, anxiety, left ventricular hypertrophy, diabetic neuropathy and per patient report unexplained weight loss (25-35lbs in past year) and a heart murmur. Patient denies history of stroke, seizure, lung problems, bowel or bladder changes, groin numbness or tingling.    Limitations Lifting;Walking;Standing;House hold activities;Other (comment)   walking, stairs, lifting items including groceries, balance, bending over, moving her neck, performing ADL's such as getting dressed, showering, bathing.   Patient Stated Goals "build up body strength and try to get imbalance under control"    Currently in Pain? No/denies    Pain Onset More than a month ago             Manual Therapy: Pt in supine for 12 min   STM to cervical paraspinals to reduce cervical tension, reduce pain/stiffness, improve cervical AROM.   Suboccipital Release 5 min to improve muscular tension and head aches.   PROM lateral cervical flexion to improve UT tension: 1x30 sec/side R and L   There.ex:   Supine chin tucks: 1x20, 5 sec holds   Supine AROM with occiput on deflated ball to improve ease of rotation motion: 2x20    Seated scap retraction: 2x20, initial PT demo    Neuro Re-ed:   Seated VOR x1: 3x60 sec, multimodal cuing required for keeping eyes fixated on target with R cervical rotation, leading to double vision. Reports improvement in double vision with reduced R cervical rotation  Standing on airex pad, CGA provided throughout    NBOS: 2x30 sec    NBOS with horizontal head turns: 3x1 min    NBOS with vertical  head turns: 1x1 min       PT Education - 02/08/21 1607     Education Details form/technique with exercise.    Person(s) Educated Patient    Methods Explanation;Demonstration;Verbal cues;Tactile cues    Comprehension Verbalized understanding;Returned demonstration;Verbal cues required;Tactile cues required;Need further instruction              PT Short Term Goals - 01/13/21 0934       PT SHORT TERM GOAL #1   Title Patient will be independent with inital home exercise program to improve strength/mobility/balance and functional independence with ADLs.    Baseline HEP to be provided at 2nd visit    Time 3    Period Weeks    Status New    Target Date 02/03/21               PT Long Term Goals - 01/13/21 0935       PT LONG TERM GOAL #1   Title Patient will  be independent with long term home exercise program to improve strength/mobility/balance and functional independence with ADLs.    Baseline HEP to be provided at 2nd session    Time 12    Period Weeks    Status New   TARGET DATE FOR ALL LONG TERM GOALS: 04/06/2021   Target Date 04/06/21      PT LONG TERM GOAL #2   Title Improve cervical AROM rotation to equal or greater than 60 degrees each direction to improve ability to check blind spot when driving, veiw surroundings, and allow patient to complete valued activities with less difficulty.    Baseline 30 L, 40 R (01/12/2021)    Time 12    Period Weeks    Status New    Target Date 04/06/21      PT LONG TERM GOAL #3   Title Patient will score equal or greater than 25/30 on Functional Gait Assessment to demonstrate reduction in fall risk to low fall risk classification.    Baseline 11/30 (01/12/2021)    Time 12    Period Weeks    Status New      PT LONG TERM GOAL #4   Title Pt will increase LE strength of by at least 1/2 MMT grade in order to demonstrate improvement in strength, function, and general mobility    Baseline BIL hip flexion 3+, knee extension 4+  (01/13/2021)      PT LONG TERM GOAL #5   Title Demonstrate improved FOTO score to 53 or greater to demonstrate improvement in overall condition and self-reported functional ability    Baseline 45 (01/10/2021)    Time 12    Period Weeks    Status New                   Plan - 02/08/21 1656     Clinical Impression Statement Pt tolerated treatment well with cervical mobility and vestibular exercises. Pt reports R sided double vision with VOR x1 seated and VOR standing on unstable surfaces that improves with lessened R cervical rotation range of motion. Pt required mod VC"s throughout vestibular exercise to mantain eyes on target which appears consistent with previous session. Pt will continue to benefit from skilled PT services to address remaining functional limitations and return to PLOF or maximal functional independence.    Personal Factors and Comorbidities Comorbidity 3+;Past/Current Experience;Time since onset of injury/illness/exacerbation    Comorbidities anxiety, aortic stenosis, depression, skin cancer, GERD, heart murmur, hyperchloremia, hypertension, neuropathy, skin cancer, type II diabetes mellitus, asthma, anxiety, left ventricular hypertrophy, diabetic neuropathy and per patient report unexplained weight loss (25-35lbs in past year) and a heart murmur    Examination-Activity Limitations Bathing;Bend;Hygiene/Grooming;Dressing;Carry;Lift;Locomotion Level;Stairs;Squat;Stand;Transfers;Other   walking, stairs, lifting items including groceries, balance, bending over, moving her neck, performing ADL's such as getting dressed, showering, bathing.   Examination-Participation Restrictions Driving;Cleaning;Interpersonal Relationship;Yard Work;Community Activity;Laundry;Shop    Stability/Clinical Decision Making Unstable/Unpredictable    Rehab Potential Fair    PT Frequency 2x / week    PT Duration 12 weeks    PT Treatment/Interventions Cryotherapy;ADLs/Self Care Home Management;Moist  Heat;Gait training;Stair training;Therapeutic activities;Functional mobility training;Therapeutic exercise;Balance training;Neuromuscular re-education;Patient/family education;Manual techniques;Passive range of motion;Vestibular;Dry needling;Biofeedback;Canalith Repostioning;DME Instruction    PT Next Visit Plan Progress vestibular, strength, and balance activities as tolerated.    PT Home Exercise Plan Medbridge Access Code: [email or web address]    Consulted and Agree with Plan of Care Patient             Patient will benefit from  skilled therapeutic intervention in order to improve the following deficits and impairments:  Impaired perceived functional ability, Abnormal gait, Decreased activity tolerance, Decreased range of motion, Decreased strength, Dizziness, Hypomobility, Impaired sensation, Pain, Decreased balance, Decreased mobility, Difficulty walking, Impaired UE functional use  Visit Diagnosis: Unsteadiness on feet  Repeated falls  Muscle weakness (generalized)  Cervicalgia     Problem List Patient Active Problem List   Diagnosis Date Noted   Cervical myelopathy ([place]) 11/15/2020   LVH (left ventricular hypertrophy) 10/14/2020   COVID-19 05/19/2020   MRSA cellulitis 04/19/2018   Decreased libido 02/04/2018   Uncontrolled type 2 diabetes mellitus with diabetic neuropathy, without long-term current use of insulin ([place]) 04/20/2017   H/O adenomatous polyp of colon 08/20/2015   Rupture of implant of right breast 05/18/2015   Anxiety disorder 04/26/2015   Allergic rhinitis 04/21/2015   Anemia 04/21/2015   Asthma 04/21/2015   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/21/2015   Chronic infection of sinus 04/21/2015   Depression 04/21/2015   GERD (gastroesophageal reflux disease) 04/21/2015   Headache 04/21/2015   Menopausal symptoms 04/21/2015   Obesity 04/21/2015   Palpitations 04/21/2015   Vitamin B12 deficiency 04/21/2015   Aortic stenosis 123XX123   Diastolic dysfunction 123XX123    Diabetes mellitus out of control ([place]) 06/12/2006   Barrett esophagus 06/13/2003   Essential hypertension 06/12/1998   Genital herpes 06/12/1998   Hyperlipidemia, mixed 06/12/1998    Salem Caster. Fairly IV, PT, DPT Physical Therapist- [facility]  02/08/2021, 5:01 PM  [facility] [address] [place], [address] Phone: [phone]   Fax:  [phone]  Name: MALIYA TEPE MRN: [id number] Date of Birth: 09/15/1950

## 2021-02-09 ENCOUNTER — Encounter: Payer: Self-pay | Admitting: Family Medicine

## 2021-02-09 ENCOUNTER — Other Ambulatory Visit: Payer: Self-pay

## 2021-02-09 ENCOUNTER — Ambulatory Visit (INDEPENDENT_AMBULATORY_CARE_PROVIDER_SITE_OTHER): Payer: PPO | Admitting: Family Medicine

## 2021-02-09 VITALS — BP 171/54 | HR 66 | Ht 60.0 in | Wt 143.0 lb

## 2021-02-09 DIAGNOSIS — F331 Major depressive disorder, recurrent, moderate: Secondary | ICD-10-CM | POA: Diagnosis not present

## 2021-02-09 DIAGNOSIS — E1165 Type 2 diabetes mellitus with hyperglycemia: Secondary | ICD-10-CM | POA: Diagnosis not present

## 2021-02-09 DIAGNOSIS — R197 Diarrhea, unspecified: Secondary | ICD-10-CM | POA: Diagnosis not present

## 2021-02-09 DIAGNOSIS — E782 Mixed hyperlipidemia: Secondary | ICD-10-CM

## 2021-02-09 DIAGNOSIS — Z23 Encounter for immunization: Secondary | ICD-10-CM

## 2021-02-09 DIAGNOSIS — G4700 Insomnia, unspecified: Secondary | ICD-10-CM | POA: Diagnosis not present

## 2021-02-09 DIAGNOSIS — R55 Syncope and collapse: Secondary | ICD-10-CM | POA: Diagnosis not present

## 2021-02-09 DIAGNOSIS — F411 Generalized anxiety disorder: Secondary | ICD-10-CM | POA: Diagnosis not present

## 2021-02-09 DIAGNOSIS — IMO0002 Reserved for concepts with insufficient information to code with codable children: Secondary | ICD-10-CM

## 2021-02-09 DIAGNOSIS — E1140 Type 2 diabetes mellitus with diabetic neuropathy, unspecified: Secondary | ICD-10-CM | POA: Diagnosis not present

## 2021-02-09 DIAGNOSIS — R41840 Attention and concentration deficit: Secondary | ICD-10-CM | POA: Diagnosis not present

## 2021-02-09 LAB — POCT GLYCOSYLATED HEMOGLOBIN (HGB A1C): Hemoglobin A1C: 11.3 % — AB (ref 4.0–5.6)

## 2021-02-09 MED ORDER — TIRZEPATIDE 2.5 MG/0.5ML ~~LOC~~ SOAJ
2.5000 mg | SUBCUTANEOUS | 0 refills | Status: DC
Start: 2021-02-09 — End: 2021-03-16

## 2021-02-09 NOTE — Patient Instructions (Addendum)
Please review the attached list of medications and notify my office if there are any errors.   Start taking samples of Mounjaro 2.5 mg once every 7 days  Avoid all dairy products and fruit juices

## 2021-02-09 NOTE — Progress Notes (Signed)
Established patient visit   Patient: Katie Buckley   DOB: 05-13-1951   70 y.o. Female  MRN: [id number] Visit Date: 02/09/2021  Today's healthcare provider: Lelon Huh, MD   No chief complaint on file.  Subjective  -------------------------------------------------------------------------------------------------------------------- HPI  Diabetes Mellitus Type II, follow-up  Lab Results  Component Value Date   HGBA1C 9.9 (A) 10/27/2020   HGBA1C 9.2 (H) 09/01/2020   HGBA1C 9.6 (A) 11/19/2019   Last seen for diabetes 3 months ago.  Management since then includes recommended increase metformin '500mg'$  twice daily and recommended increase empagliflozin to '25mg'$  daily Check blood sugar once daily, document, and provide at future appointments She reports good compliance with treatment. She is not having side effects.   Home blood sugar records:  none  Episodes of hypoglycemia? No    Current insulin regiment: none. Most Recent Eye Exam: Next week  --------------------------------------------------------------------------------------------------- Hypertension, follow-up  BP Readings from Last 3 Encounters:  11/16/20 (!) 137/49  11/03/20 (!) 156/65  10/27/20 (!) 181/56   Wt Readings from Last 3 Encounters:  11/15/20 142 lb (64.4 kg)  11/03/20 141 lb (64 kg)  10/27/20 143 lb 6.4 oz (65 kg)     She was last seen for hypertension 3 months ago.  BP at that visit was 181/56. Management since that visit includes continue current medications and Ensure daily salt intake < 2300 mg/day She reports good compliance with treatment. She is not having side effects.  She is exercising. She is adherent to low salt diet.   Outside blood pressures are .  She does not smoke.  Use of agents associated with hypertension: none.   --------------------------------------------------------------------------------------------------- Lipid/Cholesterol, follow-up  Last Lipid  Panel: Lab Results  Component Value Date   CHOL 294 (H) 09/01/2020   LDLCALC 161 (H) 09/01/2020   HDL 98 09/01/2020   TRIG 196 (H) 09/01/2020    She was last seen for this 3 months ago.  Management since that visit includes recommended increase rosuvastatin '40mg'$  daily.  She reports good compliance with treatment. She is not having side effects.  Symptoms: No appetite changes No foot ulcerations  No chest pain No chest pressure/discomfort  No dyspnea No orthopnea  No fatigue No lower extremity edema  No palpitations No paroxysmal nocturnal dyspnea  No nausea No numbness or tingling of extremity  No polydipsia No polyuria  No speech difficulty No syncope   She is following a Low Sodium diet. Current exercise: walking and physical therapy  Last metabolic panel Lab Results  Component Value Date   GLUCOSE 282 (H) 11/15/2020   NA 134 (L) 11/15/2020   K 4.0 11/15/2020   BUN 17 11/15/2020   CREATININE 0.79 11/15/2020   GFRNONAA >60 11/15/2020   GFRAA 89 11/19/2019   CALCIUM 9.1 11/15/2020   AST 17 09/01/2020   ALT 15 09/01/2020   The 10-year ASCVD risk score Mikey Bussing DC Jr., et al., 2013) is: 20.1%   Wants to know if Jardiance can be the cause of recurrent yeast infections and if she could switch to a medication that is less expensive. -Been wanting to know why she keeps having falls, has visited [facility] as well as completing physical therapy and surgery. They are now suggesting she speak with PCP because they are saying it could be an issue with her blood flow. Patient does have neuropathy in her feet and partially up her legs. She has had unremarkable carotid dopplers but not had thorough evaluation of posterior circulation.  ---------------------------------------------------------------------------------------------------  Also complains of frequent loose watery bowel movements for several weeks. Having 3-4 bowel movements every day. No blood     Medications: Outpatient  Medications Prior to Visit  Medication Sig   azelastine (ASTELIN) 0.1 % nasal spray Place 1-2 sprays into both nostrils every 12 (twelve) hours as needed.   buPROPion (WELLBUTRIN SR) 100 MG 12 hr tablet Take 100 mg by mouth 2 (two) times daily.   butalbital-acetaminophen-caffeine (FIORICET) 50-325-40 MG tablet TAKE 1 TABLET BY MOUTH EVERY 6 HOURS AS NEEDED FOR HEADACHE.   empagliflozin (JARDIANCE) 25 MG TABS tablet Take 1 tablet (25 mg total) by mouth daily.   Esomeprazole Magnesium 20 MG TBEC Take 20 mg by mouth daily before breakfast.   ezetimibe (ZETIA) 10 MG tablet TAKE 1 TABLET BY MOUTH EVERYDAY AT BEDTIME (Patient taking differently: Take 10 mg by mouth at bedtime.)   fexofenadine (ALLEGRA) 180 MG tablet Take 180 mg by mouth daily as needed for allergies or rhinitis.   fluconazole (DIFLUCAN) 150 MG tablet Take one tablet on day you receive medication Take second tablet three days later Take third and last tablet three days later   gabapentin (NEURONTIN) 300 MG capsule TAKE 2 CAPSULES (600 MG TOTAL) BY MOUTH 2 (TWO) TIMES DAILY. MORNING & SUPPER   glipiZIDE (GLUCOTROL) 10 MG tablet TAKE 1 TABLET BY MOUTH EVERY DAY BEFORE BREAKFAST   hydrOXYzine (VISTARIL) 25 MG capsule Take 25 mg by mouth daily as needed for anxiety.   irbesartan (AVAPRO) 75 MG tablet TAKE 1 TABLET BY MOUTH EVERY DAY   Lancet Devices MISC    metFORMIN (GLUCOPHAGE-XR) 500 MG 24 hr tablet Take 2 tablets (1,000 mg total) by mouth daily. (Patient taking differently: Take 500 mg by mouth daily.)   methocarbamol (ROBAXIN) 500 MG tablet Take 1 tablet (500 mg total) by mouth every 6 (six) hours as needed for muscle spasms.   metoprolol-hydrochlorothiazide (LOPRESSOR HCT) 100-25 MG tablet Take 0.5 tablets by mouth daily.   montelukast (SINGULAIR) 10 MG tablet TAKE 1 TABLET BY MOUTH EVERY DAY (Patient taking differently: Take 10 mg by mouth daily as needed (allergies).)   Olopatadine HCl 0.7 % SOLN Apply 1 drop to eye daily as needed.    ondansetron (ZOFRAN ODT) 4 MG disintegrating tablet Take 1 tablet (4 mg total) by mouth every 8 (eight) hours as needed for nausea or vomiting.   ondansetron (ZOFRAN) 4 MG tablet TAKE 1 TABLET BY MOUTH EVERY 8 HOURS AS NEEDED   oxyCODONE (OXY IR/ROXICODONE) 5 MG immediate release tablet Take 1 tablet (5 mg total) by mouth every 6 (six) hours as needed for moderate pain ((score 4 to 6)).   rosuvastatin (CRESTOR) 20 MG tablet Take 1 tablet (20 mg total) by mouth every evening.   senna (SENOKOT) 8.6 MG TABS tablet Take 1 tablet (8.6 mg total) by mouth 2 (two) times daily.   sertraline (ZOLOFT) 100 MG tablet Take 200 mg by mouth daily.   traZODone (DESYREL) 50 MG tablet Take 50-100 mg by mouth at bedtime as needed for sleep.    valACYclovir (VALTREX) 1000 MG tablet Take 1 tablet daily for 5 days as needed   No facility-administered medications prior to visit.    Review of Systems See HPI     Objective  -------------------------------------------------------------------------------------------------------------------- BP (!) 171/54 (BP Location: Left Arm, Patient Position: Sitting, Cuff Size: Normal)   Pulse 66   Ht 5' (1.524 m)   Wt 143 lb (64.9 kg)   SpO2 100%   BMI 27.93 kg/m  Physical Exam    General: Appearance:     Well developed, well nourished female in no acute distress  Eyes:    PERRL, conjunctiva/corneas clear, EOM's intact       Lungs:     Clear to auscultation bilaterally, respirations unlabored  Heart:    Normal heart rate. Normal rhythm.  3/6 harsh, crescendo-decrescendo, systolic murmur at right upper sternal border   MS:   All extremities are intact.    Neurologic:   Awake, alert, oriented x 3. No apparent focal neurological defect.         Results for orders placed or performed in visit on 02/09/21  POCT HgB A1C  Result Value Ref Range   Hemoglobin A1C 11.3 (A) 4.0 - 5.6 %    Assessment & Plan   ---------------------------------------------------------------------------------------------------------------------- 1. Uncontrolled type 2 diabetes mellitus with diabetic neuropathy, without long-term current use of insulin ([place])  - Renal function panel  Did not tolerate Ozempic and Trulicity due to nausea.  Try samples of very low dose tirzepatide (MOUNJARO) 2.5 MG/0.5ML Pen; Inject 2.5 mg into the skin once a week.  Dispense: 2 mL; Refill: 0   Will contact her in a month to see how she is tolerating and whether to increase dose.   2. Hyperlipidemia, mixed She is tolerating rosuvastatin well with no adverse effects.   - Lipid panel  3. Diarrhea, unspecified type  - Clostridium Difficile by PCR - C difficile Toxins A+B W/Rflx - Fecal lactoferrin, quant - Ova and parasite examination - Stool culture - Pancreatic elastase, fecal  4. Syncope, unspecified syncope type  - MR Angiogram Neck W Wo Contrast; Future - Zio monitor placed.    Addressed extensive list of chronic and acute medical problems today requiring 48 minutes reviewing her medical record, counseling patient regarding her conditions and coordination of care.        The entirety of the information documented in the History of Present Illness, Review of Systems and Physical Exam were personally obtained by me. Portions of this information were initially documented by the CMA and reviewed by me for thoroughness and accuracy.     Lelon Huh, MD  [facility] [phone] (phone) [phone] (fax)  [place]

## 2021-02-10 ENCOUNTER — Ambulatory Visit: Payer: PPO | Admitting: Physical Therapy

## 2021-02-10 LAB — RENAL FUNCTION PANEL
Albumin: 4.5 g/dL (ref 3.8–4.8)
BUN/Creatinine Ratio: 13 (ref 12–28)
BUN: 11 mg/dL (ref 8–27)
CO2: 24 mmol/L (ref 20–29)
Calcium: 9.5 mg/dL (ref 8.7–10.3)
Chloride: 96 mmol/L (ref 96–106)
Creatinine, Ser: 0.84 mg/dL (ref 0.57–1.00)
Glucose: 353 mg/dL — ABNORMAL HIGH (ref 65–99)
Phosphorus: 4.7 mg/dL — ABNORMAL HIGH (ref 3.0–4.3)
Potassium: 4.9 mmol/L (ref 3.5–5.2)
Sodium: 135 mmol/L (ref 134–144)
eGFR: 75 mL/min/{1.73_m2} (ref >59)

## 2021-02-10 LAB — LIPID PANEL
Chol/HDL Ratio: 2.7 ratio (ref 0.0–4.4)
Cholesterol, Total: 236 mg/dL — ABNORMAL HIGH (ref 100–199)
HDL: 88 mg/dL (ref >39)
LDL Chol Calc (NIH): 108 mg/dL — ABNORMAL HIGH (ref 0–99)
Triglycerides: 238 mg/dL — ABNORMAL HIGH (ref 0–149)
VLDL Cholesterol Cal: 40 mg/dL (ref 5–40)

## 2021-02-15 ENCOUNTER — Encounter: Payer: Self-pay | Admitting: Physical Therapy

## 2021-02-15 ENCOUNTER — Ambulatory Visit: Payer: PPO | Attending: Neurosurgery | Admitting: Physical Therapy

## 2021-02-15 DIAGNOSIS — R2681 Unsteadiness on feet: Secondary | ICD-10-CM | POA: Diagnosis not present

## 2021-02-15 DIAGNOSIS — R296 Repeated falls: Secondary | ICD-10-CM | POA: Insufficient documentation

## 2021-02-15 DIAGNOSIS — M542 Cervicalgia: Secondary | ICD-10-CM | POA: Diagnosis not present

## 2021-02-15 DIAGNOSIS — R42 Dizziness and giddiness: Secondary | ICD-10-CM | POA: Diagnosis not present

## 2021-02-15 DIAGNOSIS — M6281 Muscle weakness (generalized): Secondary | ICD-10-CM

## 2021-02-15 NOTE — Therapy (Signed)
[facility] [address], [place], [address] Phone: [phone]   Fax:  [phone]  Physical Therapy Treatment  Patient Details  Name: DAILA KOETTER MRN: [id number] Date of Birth: 10/18/1950 Referring Provider (PT): Loleta Dicker, [place]   Encounter Date: 02/15/2021   PT End of Session - 02/15/21 1710     Visit Number 6    Number of Visits 24    Date for PT Re-Evaluation 04/06/21    Authorization Type [employer]: (Reporting period from 01/12/2021)    Progress Note Due on Visit 10    PT Start Time 1647    PT Stop Time 1727    PT Time Calculation (min) 40 min    Activity Tolerance Patient tolerated treatment well    Behavior During Therapy [facility] for tasks assessed/performed             Past Medical History:  Diagnosis Date   Anxiety    Aortic stenosis, mild    a.) mean gradient 18.7 mmHg on 10/2020 TTE   Depression    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   T2DM (type 2 diabetes mellitus) ([place])     Past Surgical History:  Procedure Laterality Date   ANTERIOR CERVICAL DECOMP/DISCECTOMY FUSION N/A 11/15/2020   Procedure: ANTERIOR CERVICAL DECOMPRESSION/DISCECTOMY FUSION 1 LEVEL C3/4;  Surgeon: Deetta Perla, MD;  Location: [facility] ORS;  Service: Neurosurgery;  Laterality: N/A;   AUGMENTATION MAMMAPLASTY Bilateral    breast implants   Bone Spur  2007   foot   CARDIAC CATHETERIZATION     Carpal Tunnel Symdrome  2008   as repeated in 2009   CATARACT EXTRACTION W/PHACO Left 01/16/2019   Procedure: CATARACT EXTRACTION PHACO AND INTRAOCULAR LENS  PLACEMENT ([place]) LEFT DIABETES VISION BLUE;  Surgeon: Marchia Meiers, MD;  Location: [facility] ORS;  Service: Ophthalmology;  Laterality: Left;  [place]  00:47 CDE 6.62 Fluid pack lot # [id number]   CATARACT EXTRACTION W/PHACO Right 02/06/2019   Procedure: CATARACT EXTRACTION PHACO AND INTRAOCULAR LENS PLACEMENT (IOC);  Surgeon: Marchia Meiers, MD;  Location: [facility] ORS;  Service: Ophthalmology;  Laterality: Right;  [place] 00:54.6 CDE 6.52 FLUID PACK LOT # [id number]   CHOLECYSTECTOMY  2007   COLONOSCOPY WITH PROPOFOL N/A 11/17/2015   Procedure: COLONOSCOPY WITH PROPOFOL;  Surgeon: Manya Silvas, MD;  Location: [facility];  Service: Endoscopy;  Laterality: N/A;   ESOPHAGOGASTRODUODENOSCOPY (EGD) WITH PROPOFOL N/A 11/17/2015   Procedure: ESOPHAGOGASTRODUODENOSCOPY (EGD) WITH PROPOFOL;  Surgeon: Manya Silvas, MD;  Location: [facility] ENDOSCOPY;  Service: Endoscopy;  Laterality: N/A;   EYE SURGERY Bilateral    cataract extraction   FOOT SURGERY     MANDIBLE SURGERY     NASAL SINUS SURGERY  03/2013   Multile surgery Dr. Carlis Abbott. 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 Tamala Julian   TONSILLECTOMY  1960    There were no vitals filed for this visit.   Subjective Assessment - 02/15/21 1649     Subjective Patient reports  two of her doctors talked and they have referred her to a movement disorder clinic at [facility]. She has been very dizzy the last two days. She did not have any falls since last PT session but has come very close. She has pain currently in the back of her neck, right side up to 5-6/10. Felt pretty good for a couple of days after last PT session. states she does feel dizzier when he neck hurts more. She is currently wearing a heart monitor and she has an MRA scheduled for the circulation in the back of her head/neck. She is seeing the eye doctor tomorrow and feels like her eyes have been more dry. She notices her eyes feel irritated and she has to blink a lot. She has had  plugs put in her eyes to keep the moisture in the past. She states her HEP has been going well and she has been doing most of her exercises with her eyes closed (except the VOR X1). Thinks the X1 is getting easier especially to the right. States if she gets one of her falling episodes she is to push to button on her heart monitor on her chest.    Pertinent History Patient is a 70 year old female who presents to outpatient physical therapy with a referral for s/p cervical spinal fusion (ANTERIOR CERVICAL DECOMPRESSION/DISCECTOMY FUSION 1 LEVEL C3/4, 11/15/2020), cervicalgia, multifactoral causes for imbalance, cervical myelopathy. This patient's chief complaints consist of imbalance, history of falls, neck pain, and neck hypomobility, leading to the following functional deficits: difficulty with walking, stairs, lifting items including groceries, balance, bending over, moving her neck, performing ADL's such as getting dressed, showering, bathing. Relevant past medical history and comorbidities include anxiety, aortic stenosis, depression, skin cancer, GERD, heart murmur, hyperchloremia, hypertension, neuropathy, skin cancer, type II diabetes mellitus, asthma, anxiety, left ventricular hypertrophy, diabetic neuropathy and per patient report unexplained weight loss (25-35lbs in past year) and a heart murmur. Patient denies history of stroke, seizure, lung problems, bowel or bladder changes, groin numbness or tingling.    Limitations Lifting;Walking;Standing;House hold activities;Other (comment)   walking, stairs, lifting items including groceries, balance, bending over, moving her neck, performing ADL's such as getting dressed, showering, bathing.   Patient Stated Goals "build up body strength and try to get imbalance under control"    Currently in Pain? Yes    Pain Score 6     Pain Location Neck    Pain Orientation Right    Pain Onset More than a month ago             TREATMENT:    Manual therapy: to  reduce pain and tissue tension, improve range of motion, neuromodulation, in order to promote improved ability to complete functional activities. SUPINE             STM to cervical paraspinals and UT, R > L, to reduce cervical tension, reduce pain/stiffness, improve cervical AROM.              Suboccipital Release 5 min to improve muscular tension and head aches.              PROM lateral cervical flexion to improve UT tension: 2x30 sec/side R and L   Therapeutic exercise: to centralize symptoms and improve ROM, strength, muscular endurance, and activity tolerance required for successful completion of functional activities.              Supine chin tucks: 1x20, 5 sec holds   Supine chin tuck  to lift: 1x20, heavy tactile cuing.  (Requests to sit up for water break and reports heavy feeling in left arm).  Standing scapular rows, 3x10 with 10# cable, heavy cuing for proper form and scapular position with limited improvement. Also moved anchor overhead to encourage scapular depression and breifly tried lighter resistance).    Neuromuscular Re-education: to improve, balance, postural strength, muscle activation patterns, and stabilization strength required for functional activities:             Standing on airex pad, CGA provided throughout                          NBOS with horizontal head turns: 1x1 min                     Pt required multimodal cuing for proper technique and to facilitate improved neuromuscular control, strength, range of motion, and functional ability resulting in improved performance and form.   PT Education - 02/15/21 1931     Education Details form/technique with exercise    Person(s) Educated Patient    Methods Explanation;Demonstration;Tactile cues;Verbal cues    Comprehension Verbalized understanding;Returned demonstration;Verbal cues required;Tactile cues required;Need further instruction              PT Short Term Goals - 01/13/21 0934       PT SHORT TERM  GOAL #1   Title Patient will be independent with inital home exercise program to improve strength/mobility/balance and functional independence with ADLs.    Baseline HEP to be provided at 2nd visit    Time 3    Period Weeks    Status New    Target Date 02/03/21               PT Long Term Goals - 01/13/21 0935       PT LONG TERM GOAL #1   Title Patient will be independent with long term home exercise program to improve strength/mobility/balance and functional independence with ADLs.    Baseline HEP to be provided at 2nd session    Time 12    Period Weeks    Status New   TARGET DATE FOR ALL LONG TERM GOALS: 04/06/2021   Target Date 04/06/21      PT LONG TERM GOAL #2   Title Improve cervical AROM rotation to equal or greater than 60 degrees each direction to improve ability to check blind spot when driving, veiw surroundings, and allow patient to complete valued activities with less difficulty.    Baseline 30 L, 40 R (01/12/2021)    Time 12    Period Weeks    Status New    Target Date 04/06/21      PT LONG TERM GOAL #3   Title Patient will score equal or greater than 25/30 on Functional Gait Assessment to demonstrate reduction in fall risk to low fall risk classification.    Baseline 11/30 (01/12/2021)    Time 12    Period Weeks    Status New      PT LONG TERM GOAL #4   Title Pt will increase LE strength of by at least 1/2 MMT grade in order to demonstrate improvement in strength, function, and general mobility    Baseline BIL hip flexion 3+, knee extension 4+ (01/13/2021)      PT LONG TERM GOAL #5   Title Demonstrate improved FOTO score to 53 or greater to demonstrate improvement in overall condition and  self-reported functional ability    Baseline 45 (01/10/2021)    Time 12    Period Weeks    Status New                   Plan - 02/15/21 1931     Clinical Impression Statement [name] continues to have a lot of involuntary movement of the eyes and face today and  had a lot of difficulty coordinating her movements. Increased time spent on supine exercise and manual today. Reports mildly improved pain in neck and back of head after manual. Mentions her left arm has felt heavy several times over the last 2 weeks including after chin tuck to lift. Demonstrates poor cervical and postural control. Continues to be at increased fall risk and was accompanied out to her vehicle for safety. Patient would benefit from continued management of limiting condition by skilled physical therapist to address remaining impairments and functional limitations to work towards stated goals and return to PLOF or maximal functional independence.    Personal Factors and Comorbidities Comorbidity 3+;Past/Current Experience;Time since onset of injury/illness/exacerbation    Comorbidities anxiety, aortic stenosis, depression, skin cancer, GERD, heart murmur, hyperchloremia, hypertension, neuropathy, skin cancer, type II diabetes mellitus, asthma, anxiety, left ventricular hypertrophy, diabetic neuropathy and per patient report unexplained weight loss (25-35lbs in past year) and a heart murmur    Examination-Activity Limitations Bathing;Bend;Hygiene/Grooming;Dressing;Carry;Lift;Locomotion Level;Stairs;Squat;Stand;Transfers;Other   walking, stairs, lifting items including groceries, balance, bending over, moving her neck, performing ADL's such as getting dressed, showering, bathing.   Examination-Participation Restrictions Driving;Cleaning;Interpersonal Relationship;Yard Work;Community Activity;Laundry;Shop    Stability/Clinical Decision Making Unstable/Unpredictable    Rehab Potential Fair    PT Frequency 2x / week    PT Duration 12 weeks    PT Treatment/Interventions Cryotherapy;ADLs/Self Care Home Management;Moist Heat;Gait training;Stair training;Therapeutic activities;Functional mobility training;Therapeutic exercise;Balance training;Neuromuscular re-education;Patient/family education;Manual  techniques;Passive range of motion;Vestibular;Dry needling;Biofeedback;Canalith Repostioning;DME Instruction    PT Next Visit Plan Progress vestibular, strength, and balance activities as tolerated.    PT Home Exercise Plan Medbridge Access Code: [email or web address]    Consulted and Agree with Plan of Care Patient             Patient will benefit from skilled therapeutic intervention in order to improve the following deficits and impairments:  Impaired perceived functional ability, Abnormal gait, Decreased activity tolerance, Decreased range of motion, Decreased strength, Dizziness, Hypomobility, Impaired sensation, Pain, Decreased balance, Decreased mobility, Difficulty walking, Impaired UE functional use  Visit Diagnosis: Unsteadiness on feet  Repeated falls  Muscle weakness (generalized)  Cervicalgia  Dizziness and giddiness     Problem List Patient Active Problem List   Diagnosis Date Noted   Cervical myelopathy ([place]) 11/15/2020   LVH (left ventricular hypertrophy) 10/14/2020   COVID-19 05/19/2020   MRSA cellulitis 04/19/2018   Decreased libido 02/04/2018   Uncontrolled type 2 diabetes mellitus with diabetic neuropathy, without long-term current use of insulin ([place]) 04/20/2017   H/O adenomatous polyp of colon 08/20/2015   Rupture of implant of right breast 05/18/2015   Anxiety disorder 04/26/2015   Allergic rhinitis 04/21/2015   Anemia 04/21/2015   Asthma 04/21/2015   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/21/2015   Chronic infection of sinus 04/21/2015   Depression 04/21/2015   GERD (gastroesophageal reflux disease) 04/21/2015   Headache 04/21/2015   Menopausal symptoms 04/21/2015   Obesity 04/21/2015   Palpitations 04/21/2015   Vitamin B12 deficiency 04/21/2015   Aortic stenosis 123XX123   Diastolic dysfunction 123XX123   Diabetes mellitus out of control ([place]) 06/12/2006  Barrett esophagus 06/13/2003   Essential hypertension 06/12/1998   Genital herpes 06/12/1998    Hyperlipidemia, mixed 06/12/1998    Everlean Alstrom. Graylon Good, PT, DPT 02/15/21, 7:33 PM   [facility] [address] [place], [address] Phone: [phone]   Fax:  [phone]  Name: ODIS RUSNAK MRN: [id number] Date of Birth: [birth year]

## 2021-02-16 DIAGNOSIS — H04123 Dry eye syndrome of bilateral lacrimal glands: Secondary | ICD-10-CM | POA: Diagnosis not present

## 2021-02-17 ENCOUNTER — Ambulatory Visit: Payer: PPO | Admitting: Physical Therapy

## 2021-02-17 ENCOUNTER — Encounter: Payer: Self-pay | Admitting: Physical Therapy

## 2021-02-17 DIAGNOSIS — M6281 Muscle weakness (generalized): Secondary | ICD-10-CM

## 2021-02-17 DIAGNOSIS — R2681 Unsteadiness on feet: Secondary | ICD-10-CM

## 2021-02-17 DIAGNOSIS — M542 Cervicalgia: Secondary | ICD-10-CM

## 2021-02-17 DIAGNOSIS — R296 Repeated falls: Secondary | ICD-10-CM

## 2021-02-17 DIAGNOSIS — R42 Dizziness and giddiness: Secondary | ICD-10-CM

## 2021-02-17 NOTE — Therapy (Signed)
[facility] [address], [place], [address] Phone: [phone]   Fax:  [phone]  Physical Therapy Treatment  Patient Details  Name: Katie Buckley MRN: [id number] Date of Birth: 1950-07-13 Referring Provider (PT): Loleta Dicker, [place]   Encounter Date: 02/17/2021   PT End of Session - 02/17/21 1618     Visit Number 7    Number of Visits 24    Date for PT Re-Evaluation 04/06/21    Authorization Type [employer]: (Reporting period from 01/12/2021)    Progress Note Due on Visit 10    PT Start Time 1437    PT Stop Time 1515    PT Time Calculation (min) 38 min    Activity Tolerance Patient tolerated treatment well    Behavior During Therapy [facility] for tasks assessed/performed             Past Medical History:  Diagnosis Date   Anxiety    Aortic stenosis, mild    a.) mean gradient 18.7 mmHg on 10/2020 TTE   Depression    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   T2DM (type 2 diabetes mellitus) ([place])     Past Surgical History:  Procedure Laterality Date   ANTERIOR CERVICAL DECOMP/DISCECTOMY FUSION N/A 11/15/2020   Procedure: ANTERIOR CERVICAL DECOMPRESSION/DISCECTOMY FUSION 1 LEVEL C3/4;  Surgeon: Deetta Perla, MD;  Location: [facility] ORS;  Service: Neurosurgery;  Laterality: N/A;   AUGMENTATION MAMMAPLASTY Bilateral    breast implants   Bone Spur  2007   foot   CARDIAC CATHETERIZATION     Carpal Tunnel Symdrome  2008   as repeated in 2009   CATARACT EXTRACTION W/PHACO Left 01/16/2019   Procedure: CATARACT EXTRACTION PHACO AND INTRAOCULAR LENS  PLACEMENT ([place]) LEFT DIABETES VISION BLUE;  Surgeon: Marchia Meiers, MD;  Location: [facility] ORS;  Service: Ophthalmology;  Laterality: Left;  [place]  00:47 CDE 6.62 Fluid pack lot # [id number]   CATARACT EXTRACTION W/PHACO Right 02/06/2019   Procedure: CATARACT EXTRACTION PHACO AND INTRAOCULAR LENS PLACEMENT (IOC);  Surgeon: Marchia Meiers, MD;  Location: [facility] ORS;  Service: Ophthalmology;  Laterality: Right;  [place] 00:54.6 CDE 6.52 FLUID PACK LOT # [id number]   CHOLECYSTECTOMY  2007   COLONOSCOPY WITH PROPOFOL N/A 11/17/2015   Procedure: COLONOSCOPY WITH PROPOFOL;  Surgeon: Manya Silvas, MD;  Location[facility];  Service: Endoscopy;  Laterality: N/A;   ESOPHAGOGASTRODUODENOSCOPY (EGD) WITH PROPOFOL N/A 11/17/2015   Procedure: ESOPHAGOGASTRODUODENOSCOPY (EGD) WITH PROPOFOL;  Surgeon: Manya Silvas, MD;  Location: [facility] ENDOSCOPY;  Service: Endoscopy;  Laterality: N/A;   EYE SURGERY Bilateral    cataract extraction   FOOT SURGERY     MANDIBLE SURGERY     NASAL SINUS SURGERY  03/2013   Multile surgery Dr. Carlis Abbott. 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 Tamala Julian   TONSILLECTOMY  1960    There were no vitals filed for this visit.   Subjective Assessment - 02/17/21 1439     Subjective Patient reports  her neck is hurting so much it is making her feel a bit ill. She states the suboccipital reliease helped and she felt better for two days. She woke up today with pinching hurting feeling in her right side of her neck. rates it 8/10 current. Has still been wobbly. No falls since last PT session but has come close a couple of time. Saw eye doctor yesterday and other than severe dry eye the doctor did not see anything that would affect her balance in regards to her eyes. Eye exam was 20/20. left arm continues to bother her and feel a bit tingly but not a much as last session.    Pertinent History Patient is a 70 year old female who presents to outpatient physical therapy  with a referral for s/p cervical spinal fusion (ANTERIOR CERVICAL DECOMPRESSION/DISCECTOMY FUSION 1 LEVEL C3/4, 11/15/2020), cervicalgia, multifactoral causes for imbalance, cervical myelopathy. This patient's chief complaints consist of imbalance, history of falls, neck pain, and neck hypomobility, leading to the following functional deficits: difficulty with walking, stairs, lifting items including groceries, balance, bending over, moving her neck, performing ADL's such as getting dressed, showering, bathing. Relevant past medical history and comorbidities include anxiety, aortic stenosis, depression, skin cancer, GERD, heart murmur, hyperchloremia, hypertension, neuropathy, skin cancer, type II diabetes mellitus, asthma, anxiety, left ventricular hypertrophy, diabetic neuropathy and per patient report unexplained weight loss (25-35lbs in past year) and a heart murmur. Patient denies history of stroke, seizure, lung problems, bowel or bladder changes, groin numbness or tingling.    Limitations Lifting;Walking;Standing;House hold activities;Other (comment)   walking, stairs, lifting items including groceries, balance, bending over, moving her neck, performing ADL's such as getting dressed, showering, bathing.   Patient Stated Goals "build up body strength and try to get imbalance under control"    Currently in Pain? Yes    Pain Score 8     Pain Location Neck    Pain Orientation Right    Pain Onset More than a month ago             TREATMENT:      Manual therapy: to reduce pain and tissue tension, improve range of motion, neuromodulation, in order to promote improved ability to complete functional activities. SUPINE - STM including Suboccipital Release to suboccipital muscles at the back of the head improve muscular tension and head aches.   Therapeutic exercise: to centralize symptoms and improve ROM, strength, muscular endurance, and activity tolerance required for successful completion of  functional activities.  - Supine chin tucks: 1x20, 5 sec hold - Demonstration and trial of LAX balls tied together with glove for self-soft tissue mobilization.     Neuromuscular Re-education: to improve, balance, postural strength, muscle activation patterns, and stabilization strength required for functional activities: - walking with quick turns, 3x10 turns with SBA, to improve dynamic balance and equilibrium - figure 8 walking 1x10 cycles with CGA for improved dynamic balance and equilibrium                    Pt required multimodal cuing for proper technique and to facilitate improved neuromuscular control, strength, range of motion, and functional ability resulting in improved performance and form.    PT Education - 02/17/21 1617     Education Details form/technique with exercise    Person(s) Educated Patient    Methods Explanation;Demonstration;Tactile cues;Verbal cues    Comprehension Verbalized understanding;Returned demonstration;Verbal cues required;Tactile cues required;Need further instruction  PT Short Term Goals - 02/17/21 1619       PT SHORT TERM GOAL #1   Title Patient will be independent with inital home exercise program to improve strength/mobility/balance and functional independence with ADLs.    Baseline HEP to be provided at 2nd visit    Time 3    Period Weeks    Status Achieved    Target Date 02/03/21               PT Long Term Goals - 01/13/21 0935       PT LONG TERM GOAL #1   Title Patient will be independent with long term home exercise program to improve strength/mobility/balance and functional independence with ADLs.    Baseline HEP to be provided at 2nd session    Time 12    Period Weeks    Status New   TARGET DATE FOR ALL LONG TERM GOALS: 04/06/2021   Target Date 04/06/21      PT LONG TERM GOAL #2   Title Improve cervical AROM rotation to equal or greater than 60 degrees each direction to improve ability to check blind  spot when driving, veiw surroundings, and allow patient to complete valued activities with less difficulty.    Baseline 30 L, 40 R (01/12/2021)    Time 12    Period Weeks    Status New    Target Date 04/06/21      PT LONG TERM GOAL #3   Title Patient will score equal or greater than 25/30 on Functional Gait Assessment to demonstrate reduction in fall risk to low fall risk classification.    Baseline 11/30 (01/12/2021)    Time 12    Period Weeks    Status New      PT LONG TERM GOAL #4   Title Pt will increase LE strength of by at least 1/2 MMT grade in order to demonstrate improvement in strength, function, and general mobility    Baseline BIL hip flexion 3+, knee extension 4+ (01/13/2021)      PT LONG TERM GOAL #5   Title Demonstrate improved FOTO score to 53 or greater to demonstrate improvement in overall condition and self-reported functional ability    Baseline 45 (01/10/2021)    Time 12    Period Weeks    Status New                   Plan - 02/17/21 1617     Clinical Impression Statement Patient with high level of pain reported at right suboccipital region, partially relived by suboccipital release. Educated patient on how to use LAX balls for self-release in this area for improved comfort at home. Progressed VOR X1 exercise to standing with good tolerance. Patient continues to move head quite slowly to prevent losing focus on target. Patient also practiced dynamic balance activities and required guarding for safety. Continues to report near falls and demonstrates increased fall risk in clinic. Patient would benefit from continued management of limiting condition by skilled physical therapist to address remaining impairments and functional limitations to work towards stated goals and return to PLOF or maximal functional independence.    Personal Factors and Comorbidities Comorbidity 3+;Past/Current Experience;Time since onset of injury/illness/exacerbation    Comorbidities  anxiety, aortic stenosis, depression, skin cancer, GERD, heart murmur, hyperchloremia, hypertension, neuropathy, skin cancer, type II diabetes mellitus, asthma, anxiety, left ventricular hypertrophy, diabetic neuropathy and per patient report unexplained weight loss (25-35lbs in past year) and a heart murmur  Examination-Activity Limitations Bathing;Bend;Hygiene/Grooming;Dressing;Carry;Lift;Locomotion Level;Stairs;Squat;Stand;Transfers;Other   walking, stairs, lifting items including groceries, balance, bending over, moving her neck, performing ADL's such as getting dressed, showering, bathing.   Examination-Participation Restrictions Driving;Cleaning;Interpersonal Relationship;Yard Work;Community Activity;Laundry;Shop    Stability/Clinical Decision Making Unstable/Unpredictable    Rehab Potential Fair    PT Frequency 2x / week    PT Duration 12 weeks    PT Treatment/Interventions Cryotherapy;ADLs/Self Care Home Management;Moist Heat;Gait training;Stair training;Therapeutic activities;Functional mobility training;Therapeutic exercise;Balance training;Neuromuscular re-education;Patient/family education;Manual techniques;Passive range of motion;Vestibular;Dry needling;Biofeedback;Canalith Repostioning;DME Instruction    PT Next Visit Plan Progress vestibular, strength, and balance activities as tolerated.    PT Home Exercise Plan Medbridge Access Code: [email or web address]    Consulted and Agree with Plan of Care Patient             Patient will benefit from skilled therapeutic intervention in order to improve the following deficits and impairments:  Impaired perceived functional ability, Abnormal gait, Decreased activity tolerance, Decreased range of motion, Decreased strength, Dizziness, Hypomobility, Impaired sensation, Pain, Decreased balance, Decreased mobility, Difficulty walking, Impaired UE functional use  Visit Diagnosis: Unsteadiness on feet  Repeated falls  Muscle weakness  (generalized)  Cervicalgia  Dizziness and giddiness     Problem List Patient Active Problem List   Diagnosis Date Noted   Cervical myelopathy ([place]) 11/15/2020   LVH (left ventricular hypertrophy) 10/14/2020   COVID-19 05/19/2020   MRSA cellulitis 04/19/2018   Decreased libido 02/04/2018   Uncontrolled type 2 diabetes mellitus with diabetic neuropathy, without long-term current use of insulin ([place]) 04/20/2017   H/O adenomatous polyp of colon 08/20/2015   Rupture of implant of right breast 05/18/2015   Anxiety disorder 04/26/2015   Allergic rhinitis 04/21/2015   Anemia 04/21/2015   Asthma 04/21/2015   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/21/2015   Chronic infection of sinus 04/21/2015   Depression 04/21/2015   GERD (gastroesophageal reflux disease) 04/21/2015   Headache 04/21/2015   Menopausal symptoms 04/21/2015   Obesity 04/21/2015   Palpitations 04/21/2015   Vitamin B12 deficiency 04/21/2015   Aortic stenosis 123XX123   Diastolic dysfunction 123XX123   Diabetes mellitus out of control ([place]) 06/12/2006   Barrett esophagus 06/13/2003   Essential hypertension 06/12/1998   Genital herpes 06/12/1998   Hyperlipidemia, mixed 06/12/1998    Everlean Alstrom. Graylon Good, PT, DPT 02/17/21, 4:20 PM   [facility] [address] [place], [address] Phone: [phone]   Fax:  [phone]  Name: Katie Buckley MRN: [id number] Date of Birth: 1950-09-11

## 2021-02-21 DIAGNOSIS — F411 Generalized anxiety disorder: Secondary | ICD-10-CM | POA: Diagnosis not present

## 2021-02-21 DIAGNOSIS — F331 Major depressive disorder, recurrent, moderate: Secondary | ICD-10-CM | POA: Diagnosis not present

## 2021-02-22 ENCOUNTER — Ambulatory Visit: Payer: PPO | Admitting: Physical Therapy

## 2021-02-22 ENCOUNTER — Ambulatory Visit
Admission: RE | Admit: 2021-02-22 | Discharge: 2021-02-22 | Disposition: A | Discharge status: Home / Self Care | Payer: PPO | Source: Ambulatory Visit | Attending: Family Medicine | Admitting: Family Medicine

## 2021-02-22 ENCOUNTER — Other Ambulatory Visit: Payer: Self-pay

## 2021-02-22 DIAGNOSIS — I6522 Occlusion and stenosis of left carotid artery: Secondary | ICD-10-CM | POA: Diagnosis not present

## 2021-02-22 DIAGNOSIS — R55 Syncope and collapse: Secondary | ICD-10-CM | POA: Insufficient documentation

## 2021-02-22 MED ORDER — GADOBUTROL 1 MMOL/ML IV SOLN
6.0000 mL | Freq: Once | INTRAVENOUS | Status: AC | PRN
  Administered 2021-02-22: 6 mL via INTRAVENOUS

## 2021-02-23 DIAGNOSIS — D2261 Melanocytic nevi of right upper limb, including shoulder: Secondary | ICD-10-CM | POA: Diagnosis not present

## 2021-02-23 DIAGNOSIS — D225 Melanocytic nevi of trunk: Secondary | ICD-10-CM | POA: Diagnosis not present

## 2021-02-23 DIAGNOSIS — D485 Neoplasm of uncertain behavior of skin: Secondary | ICD-10-CM | POA: Diagnosis not present

## 2021-02-23 DIAGNOSIS — D2272 Melanocytic nevi of left lower limb, including hip: Secondary | ICD-10-CM | POA: Diagnosis not present

## 2021-02-23 DIAGNOSIS — D045 Carcinoma in situ of skin of trunk: Secondary | ICD-10-CM | POA: Diagnosis not present

## 2021-02-23 DIAGNOSIS — D2271 Melanocytic nevi of right lower limb, including hip: Secondary | ICD-10-CM | POA: Diagnosis not present

## 2021-02-23 DIAGNOSIS — D2262 Melanocytic nevi of left upper limb, including shoulder: Secondary | ICD-10-CM | POA: Diagnosis not present

## 2021-02-24 ENCOUNTER — Encounter: Payer: Self-pay | Admitting: Physical Therapy

## 2021-02-24 ENCOUNTER — Ambulatory Visit: Payer: PPO | Admitting: Physical Therapy

## 2021-02-24 DIAGNOSIS — R42 Dizziness and giddiness: Secondary | ICD-10-CM

## 2021-02-24 DIAGNOSIS — R2681 Unsteadiness on feet: Secondary | ICD-10-CM

## 2021-02-24 DIAGNOSIS — H04123 Dry eye syndrome of bilateral lacrimal glands: Secondary | ICD-10-CM | POA: Diagnosis not present

## 2021-02-24 DIAGNOSIS — M6281 Muscle weakness (generalized): Secondary | ICD-10-CM

## 2021-02-24 DIAGNOSIS — M542 Cervicalgia: Secondary | ICD-10-CM

## 2021-02-24 DIAGNOSIS — R296 Repeated falls: Secondary | ICD-10-CM

## 2021-02-24 NOTE — Therapy (Signed)
[facility] [address], [place], [address] Phone: [phone]   Fax:  [phone]  Physical Therapy Treatment  Patient Details  Name: Katie Buckley MRN: [id number] Date of Birth: 26-Jun-1950 Referring Provider (PT): Loleta Dicker, [place]   Encounter Date: 02/24/2021   PT End of Session - 02/24/21 1408     Visit Number 8    Number of Visits 24    Date for PT Re-Evaluation 04/06/21    Authorization Type [employer]: (Reporting period from 01/12/2021)    Progress Note Due on Visit 10    PT Start Time 1347    PT Stop Time 1427    PT Time Calculation (min) 40 min    Activity Tolerance Patient tolerated treatment well    Behavior During Therapy [facility] for tasks assessed/performed             Past Medical History:  Diagnosis Date   Anxiety    Aortic stenosis, mild    a.) mean gradient 18.7 mmHg on 10/2020 TTE   Depression    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   T2DM (type 2 diabetes mellitus) ([place])     Past Surgical History:  Procedure Laterality Date   ANTERIOR CERVICAL DECOMP/DISCECTOMY FUSION N/A 11/15/2020   Procedure: ANTERIOR CERVICAL DECOMPRESSION/DISCECTOMY FUSION 1 LEVEL C3/4;  Surgeon: Deetta Perla, MD;  Location: [facility] ORS;  Service: Neurosurgery;  Laterality: N/A;   AUGMENTATION MAMMAPLASTY Bilateral    breast implants   Bone Spur  2007   foot   CARDIAC CATHETERIZATION     Carpal Tunnel Symdrome  2008   as repeated in 2009   CATARACT EXTRACTION W/PHACO Left 01/16/2019   Procedure: CATARACT EXTRACTION PHACO AND INTRAOCULAR LENS  PLACEMENT ([place]) LEFT DIABETES VISION BLUE;  Surgeon: Marchia Meiers, MD;  Location: [facility] ORS;  Service: Ophthalmology;  Laterality: Left;  [place]  00:47 CDE 6.62 Fluid pack lot # [id number]   CATARACT EXTRACTION W/PHACO Right 02/06/2019   Procedure: CATARACT EXTRACTION PHACO AND INTRAOCULAR LENS PLACEMENT (IOC);  Surgeon: Marchia Meiers, MD;  Location: [facility] ORS;  Service: Ophthalmology;  Laterality: Right;  [place] 00:54.6 CDE 6.52 FLUID PACK LOT # [id number]   CHOLECYSTECTOMY  2007   COLONOSCOPY WITH PROPOFOL N/A 11/17/2015   Procedure: COLONOSCOPY WITH PROPOFOL;  Surgeon: Manya Silvas, MD;  Location: [facility] ENDOSCOPY;  Service: Endoscopy;  Laterality: N/A;   ESOPHAGOGASTRODUODENOSCOPY (EGD) WITH PROPOFOL N/A 11/17/2015   Procedure: ESOPHAGOGASTRODUODENOSCOPY (EGD) WITH PROPOFOL;  Surgeon: Manya Silvas, MD;  Location: [facility] ENDOSCOPY;  Service: Endoscopy;  Laterality: N/A;   EYE SURGERY Bilateral    cataract extraction   FOOT SURGERY     MANDIBLE SURGERY     NASAL SINUS SURGERY  03/2013   Multile surgery Dr. Carlis Abbott. 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 Tamala Julian   TONSILLECTOMY  1960    There were no vitals filed for this visit.   Subjective Assessment - 02/24/21 1350     Subjective Patient reports  she has been feeling better for the last two days. She thinks maybe she can attribute it to being able to relax more and handle some of her stressers better. She has been going through some really tough situations with her daughter and she was able to have a good talk with her daughter and with her counselor. She states she had an MRA earlier in the week and that went well, but she does not have the results. Her unsteadiness is still present and she does not think it has gotten any worse. She is no longer wearing her heart monitor and she is also waiting to hear about those results. Reports no falling episodes since last PT session. She does endorse some stumbling and  having to catch herself but not falls. Rates current pain 2-3/10 at her right neck region. HEP is going well and feels like her exercises are getting easier including the VORx1. Reports she is going to eye doctor after today's PT appointment to get plugs put in her eyes to help with the dryness.    Pertinent History Patient is a 70 year old female who presents to outpatient physical therapy with a referral for s/p cervical spinal fusion (ANTERIOR CERVICAL DECOMPRESSION/DISCECTOMY FUSION 1 LEVEL C3/4, 11/15/2020), cervicalgia, multifactoral causes for imbalance, cervical myelopathy. This patient's chief complaints consist of imbalance, history of falls, neck pain, and neck hypomobility, leading to the following functional deficits: difficulty with walking, stairs, lifting items including groceries, balance, bending over, moving her neck, performing ADL's such as getting dressed, showering, bathing. Relevant past medical history and comorbidities include anxiety, aortic stenosis, depression, skin cancer, GERD, heart murmur, hyperchloremia, hypertension, neuropathy, skin cancer, type II diabetes mellitus, asthma, anxiety, left ventricular hypertrophy, diabetic neuropathy and per patient report unexplained weight loss (25-35lbs in past year) and a heart murmur. Patient denies history of stroke, seizure, lung problems, bowel or bladder changes, groin numbness or tingling.    Limitations Lifting;Walking;Standing;House hold activities;Other (comment)   walking, stairs, lifting items including groceries, balance, bending over, moving her neck, performing ADL's such as getting dressed, showering, bathing.   Patient Stated Goals "build up body strength and try to get imbalance under control"    Currently in Pain? Yes    Pain Score 3     Pain Location Neck    Pain Onset More than a month ago              TREATMENT:      Manual therapy: to reduce pain and tissue tension, improve range of motion,  neuromodulation, in order to promote improved ability to complete functional activities. SUPINE - STM including Suboccipital Release to suboccipital muscles at the back of the head improve muscular tension and head aches.    Therapeutic exercise: to centralize symptoms and improve ROM, strength, muscular endurance, and activity tolerance required for successful completion of functional activities.  - Supine chin tucks: 1x20, 5 sec hold - Supine chin tuck with left, 1x15 (has difficulty with coordination and tends to protrude chin).  - prone on elbows retraction, 3x10 (max cuing with partial carry over, responds to "bring face close to pillow, then far from pillow" with pillow under forearm).    Neuromuscular Re-education: to improve, balance, postural strength, muscle activation patterns, and stabilization strength required for functional activities: - standing (self-selected stance) VORx1 exercise, 3x60 seconds (recommended she could progress to standing at home).  - Standing on airex pad, CGA provided throughout   NBOS with eyes closed, 2x1 min. (Touch down  UE support and SBA-CGA).                     Pt required multimodal cuing for proper technique and to facilitate improved neuromuscular control, strength, range of motion, and functional ability resulting in improved performance and form.    PT Education - 02/24/21 1408     Education Details form/technique with exercise    Person(s) Educated Patient    Methods Explanation;Demonstration;Tactile cues;Verbal cues    Comprehension Verbalized understanding;Returned demonstration;Verbal cues required;Tactile cues required;Need further instruction              PT Short Term Goals - 02/17/21 1619       PT SHORT TERM GOAL #1   Title Patient will be independent with inital home exercise program to improve strength/mobility/balance and functional independence with ADLs.    Baseline HEP to be provided at 2nd visit    Time 3    Period  Weeks    Status Achieved    Target Date 02/03/21               PT Long Term Goals - 01/13/21 0935       PT LONG TERM GOAL #1   Title Patient will be independent with long term home exercise program to improve strength/mobility/balance and functional independence with ADLs.    Baseline HEP to be provided at 2nd session    Time 12    Period Weeks    Status New   TARGET DATE FOR ALL LONG TERM GOALS: 04/06/2021   Target Date 04/06/21      PT LONG TERM GOAL #2   Title Improve cervical AROM rotation to equal or greater than 60 degrees each direction to improve ability to check blind spot when driving, veiw surroundings, and allow patient to complete valued activities with less difficulty.    Baseline 30 L, 40 R (01/12/2021)    Time 12    Period Weeks    Status New    Target Date 04/06/21      PT LONG TERM GOAL #3   Title Patient will score equal or greater than 25/30 on Functional Gait Assessment to demonstrate reduction in fall risk to low fall risk classification.    Baseline 11/30 (01/12/2021)    Time 12    Period Weeks    Status New      PT LONG TERM GOAL #4   Title Pt will increase LE strength of by at least 1/2 MMT grade in order to demonstrate improvement in strength, function, and general mobility    Baseline BIL hip flexion 3+, knee extension 4+ (01/13/2021)      PT LONG TERM GOAL #5   Title Demonstrate improved FOTO score to 53 or greater to demonstrate improvement in overall condition and self-reported functional ability    Baseline 45 (01/10/2021)    Time 12    Period Weeks    Status New                   Plan - 02/24/21 1437     Clinical Impression Statement [name] tolerated treatment well and demonstrated improved ability to perform VORX1 with faster speed and without double vision. Overall doing better today with pain and balance. Patient does continue to have pain, neck stiffness, and vestibular disturbance that is limiting her function and makes her a  high fall risk. Patient would benefit from continued management of limiting condition by skilled physical therapist to address remaining  impairments and functional limitations to work towards stated goals and return to PLOF or maximal functional independence.    Personal Factors and Comorbidities Comorbidity 3+;Past/Current Experience;Time since onset of injury/illness/exacerbation    Comorbidities anxiety, aortic stenosis, depression, skin cancer, GERD, heart murmur, hyperchloremia, hypertension, neuropathy, skin cancer, type II diabetes mellitus, asthma, anxiety, left ventricular hypertrophy, diabetic neuropathy and per patient report unexplained weight loss (25-35lbs in past year) and a heart murmur    Examination-Activity Limitations Bathing;Bend;Hygiene/Grooming;Dressing;Carry;Lift;Locomotion Level;Stairs;Squat;Stand;Transfers;Other   walking, stairs, lifting items including groceries, balance, bending over, moving her neck, performing ADL's such as getting dressed, showering, bathing.   Examination-Participation Restrictions Driving;Cleaning;Interpersonal Relationship;Yard Work;Community Activity;Laundry;Shop    Stability/Clinical Decision Making Unstable/Unpredictable    Rehab Potential Fair    PT Frequency 2x / week    PT Duration 12 weeks    PT Treatment/Interventions Cryotherapy;ADLs/Self Care Home Management;Moist Heat;Gait training;Stair training;Therapeutic activities;Functional mobility training;Therapeutic exercise;Balance training;Neuromuscular re-education;Patient/family education;Manual techniques;Passive range of motion;Vestibular;Dry needling;Biofeedback;Canalith Repostioning;DME Instruction    PT Next Visit Plan Progress vestibular, strength, and balance activities as tolerated.    PT Home Exercise Plan Medbridge Access Code: [email or web address]    Consulted and Agree with Plan of Care Patient             Patient will benefit from skilled therapeutic intervention in order to improve the  following deficits and impairments:  Impaired perceived functional ability, Abnormal gait, Decreased activity tolerance, Decreased range of motion, Decreased strength, Dizziness, Hypomobility, Impaired sensation, Pain, Decreased balance, Decreased mobility, Difficulty walking, Impaired UE functional use  Visit Diagnosis: Unsteadiness on feet  Repeated falls  Muscle weakness (generalized)  Cervicalgia  Dizziness and giddiness     Problem List Patient Active Problem List   Diagnosis Date Noted   Cervical myelopathy ([place]) 11/15/2020   LVH (left ventricular hypertrophy) 10/14/2020   COVID-19 05/19/2020   MRSA cellulitis 04/19/2018   Decreased libido 02/04/2018   Uncontrolled type 2 diabetes mellitus with diabetic neuropathy, without long-term current use of insulin ([place]) 04/20/2017   H/O adenomatous polyp of colon 08/20/2015   Rupture of implant of right breast 05/18/2015   Anxiety disorder 04/26/2015   Allergic rhinitis 04/21/2015   Anemia 04/21/2015   Asthma 04/21/2015   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/21/2015   Chronic infection of sinus 04/21/2015   Depression 04/21/2015   GERD (gastroesophageal reflux disease) 04/21/2015   Headache 04/21/2015   Menopausal symptoms 04/21/2015   Obesity 04/21/2015   Palpitations 04/21/2015   Vitamin B12 deficiency 04/21/2015   Aortic stenosis 123XX123   Diastolic dysfunction 123XX123   Diabetes mellitus out of control ([place]) 06/12/2006   Barrett esophagus 06/13/2003   Essential hypertension 06/12/1998   Genital herpes 06/12/1998   Hyperlipidemia, mixed 06/12/1998    Everlean Alstrom. Graylon Good, PT, DPT 02/24/21, 2:39 PM   [facility] [address] [place], [address] Phone: [phone]   Fax:  [phone]  Name: Katie Buckley MRN: [id number] Date of Birth: 11-26-1950

## 2021-02-28 ENCOUNTER — Other Ambulatory Visit: Payer: Self-pay | Admitting: Family Medicine

## 2021-03-01 ENCOUNTER — Encounter: Payer: Self-pay | Admitting: Physical Therapy

## 2021-03-01 ENCOUNTER — Ambulatory Visit: Payer: PPO | Admitting: Physical Therapy

## 2021-03-01 DIAGNOSIS — R2681 Unsteadiness on feet: Secondary | ICD-10-CM | POA: Diagnosis not present

## 2021-03-01 DIAGNOSIS — M6281 Muscle weakness (generalized): Secondary | ICD-10-CM

## 2021-03-01 DIAGNOSIS — M542 Cervicalgia: Secondary | ICD-10-CM

## 2021-03-01 DIAGNOSIS — M19041 Primary osteoarthritis, right hand: Secondary | ICD-10-CM | POA: Diagnosis not present

## 2021-03-01 DIAGNOSIS — R42 Dizziness and giddiness: Secondary | ICD-10-CM

## 2021-03-01 DIAGNOSIS — R296 Repeated falls: Secondary | ICD-10-CM

## 2021-03-01 NOTE — Therapy (Signed)
[place] [facility] [address], [place], [address] Phone: [phone]   Fax:  [phone]  Physical Therapy Treatment  Patient Details  Name: Katie Buckley MRN: [id number] Date of Birth: 1950/12/31 Referring Provider (PT): Loleta Dicker, [place]   Encounter Date: 03/01/2021   PT End of Session - 03/01/21 1209     Visit Number 9    Number of Visits 24    Date for PT Re-Evaluation 04/06/21    Authorization Type [employer]: (Reporting period from 01/12/2021)    Progress Note Due on Visit 10    PT Start Time 1128    PT Stop Time 1206    PT Time Calculation (min) 38 min    Equipment Utilized During Treatment Gait belt    Activity Tolerance Patient tolerated treatment well    Behavior During Therapy [facility] for tasks assessed/performed             Past Medical History:  Diagnosis Date   Anxiety    Aortic stenosis, mild    a.) mean gradient 18.7 mmHg on 10/2020 TTE   Depression    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   T2DM (type 2 diabetes mellitus) ([place])     Past Surgical History:  Procedure Laterality Date   ANTERIOR CERVICAL DECOMP/DISCECTOMY FUSION N/A 11/15/2020   Procedure: ANTERIOR CERVICAL DECOMPRESSION/DISCECTOMY FUSION 1 LEVEL C3/4;  Surgeon: Deetta Perla, MD;  Location: [facility] ORS;  Service: Neurosurgery;  Laterality: N/A;   AUGMENTATION MAMMAPLASTY Bilateral    breast implants   Bone Spur  2007   foot   CARDIAC CATHETERIZATION     Carpal Tunnel Symdrome  2008   as repeated in 2009   CATARACT EXTRACTION W/PHACO Left 01/16/2019    Procedure: CATARACT EXTRACTION PHACO AND INTRAOCULAR LENS PLACEMENT ([place]) LEFT DIABETES VISION BLUE;  Surgeon: Marchia Meiers, MD;  Location: [facility] ORS;  Service: Ophthalmology;  Laterality: Left;  [place]  00:47 CDE 6.62 Fluid pack lot # [id number]   CATARACT EXTRACTION W/PHACO Right 02/06/2019   Procedure: CATARACT EXTRACTION PHACO AND INTRAOCULAR LENS PLACEMENT (IOC);  Surgeon: Marchia Meiers, MD;  Location: [facility] ORS;  Service: Ophthalmology;  Laterality: Right;  [place] 00:54.6 CDE 6.52 FLUID PACK LOT # [id number]   CHOLECYSTECTOMY  2007   COLONOSCOPY WITH PROPOFOL N/A 11/17/2015   Procedure: COLONOSCOPY WITH PROPOFOL;  Surgeon: Manya Silvas, MD;  Location: [facility];  Service: Endoscopy;  Laterality: N/A;   ESOPHAGOGASTRODUODENOSCOPY (EGD) WITH PROPOFOL N/A 11/17/2015   Procedure: ESOPHAGOGASTRODUODENOSCOPY (EGD) WITH PROPOFOL;  Surgeon: Manya Silvas, MD;  Location: [facility] ENDOSCOPY;  Service: Endoscopy;  Laterality: N/A;   EYE SURGERY Bilateral    cataract extraction   FOOT SURGERY     MANDIBLE SURGERY     NASAL SINUS SURGERY  03/2013   Multile surgery Dr. Carlis Abbott. 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 Tamala Julian   TONSILLECTOMY  1960    There were no vitals filed for this visit.   Subjective Assessment -  03/01/21 1125     Subjective Patient reports she has 2/10 pain more on the left neck than the right. She states she felt pretty good after last PT session and feels like what we are doing is working. No episodes since last session. HEP is going well.    Pertinent History Patient is a 70 year old female who presents to outpatient physical therapy with a referral for s/p cervical spinal fusion (ANTERIOR CERVICAL DECOMPRESSION/DISCECTOMY FUSION 1 LEVEL C3/4, 11/15/2020), cervicalgia, multifactoral causes for imbalance, cervical myelopathy. This patient's chief complaints consist of imbalance, history of falls, neck pain, and neck hypomobility, leading to the  following functional deficits: difficulty with walking, stairs, lifting items including groceries, balance, bending over, moving her neck, performing ADL's such as getting dressed, showering, bathing. Relevant past medical history and comorbidities include anxiety, aortic stenosis, depression, skin cancer, GERD, heart murmur, hyperchloremia, hypertension, neuropathy, skin cancer, type II diabetes mellitus, asthma, anxiety, left ventricular hypertrophy, diabetic neuropathy and per patient report unexplained weight loss (25-35lbs in past year) and a heart murmur. Patient denies history of stroke, seizure, lung problems, bowel or bladder changes, groin numbness or tingling.    Limitations Lifting;Walking;Standing;House hold activities;Other (comment)   walking, stairs, lifting items including groceries, balance, bending over, moving her neck, performing ADL's such as getting dressed, showering, bathing.   Patient Stated Goals "build up body strength and try to get imbalance under control"    Currently in Pain? Yes    Pain Score 2     Pain Onset More than a month ago             TREATMENT:      Manual therapy: to reduce pain and tissue tension, improve range of motion, neuromodulation, in order to promote improved ability to complete functional activities. SUPINE - STM including Suboccipital Release to suboccipital muscles at the back of the head improve muscular tension and head aches.    Neuromuscular Re-education: to improve, balance, postural strength, muscle activation patterns, and stabilization strength required for functional activities: - standing (self-selected stance) VORx1 exercise, 3x60 seconds  - standing roll on the wall, 1x5 full revolutions each side, SBA for safety - ambulation with horizontal head turns, 10x30 feet, CGA (challenging) - ambulation with vertical head nods, 2x30 feet, CGA (easier).   - Standing  airex pad, CGA provided throughout              NBOS with eyes  open, 1x1 min NBOS with eyes closed, 3x1 min. (Touch down UE support and SBA-CGA).   - ambulation ~ 100 feet to vehicle with SBA for safety at end of session                    Pt required multimodal cuing for proper technique and to facilitate improved neuromuscular control, strength, range of motion, and functional ability resulting in improved performance and form.    PT Education - 03/01/21 1209     Education Details form/technique with exercise    Person(s) Educated Patient    Methods Explanation;Demonstration;Tactile cues;Verbal cues    Comprehension Verbalized understanding;Returned demonstration;Verbal cues required;Tactile cues required;Need further instruction              PT Short Term Goals - 02/17/21 1619       PT SHORT TERM GOAL #1   Title Patient will be independent with inital home exercise program to improve strength/mobility/balance and functional independence with ADLs.    Baseline HEP to be provided at 2nd  visit    Time 3    Period Weeks    Status Achieved    Target Date 02/03/21               PT Long Term Goals - 01/13/21 0935       PT LONG TERM GOAL #1   Title Patient will be independent with long term home exercise program to improve strength/mobility/balance and functional independence with ADLs.    Baseline HEP to be provided at 2nd session    Time 12    Period Weeks    Status New   TARGET DATE FOR ALL LONG TERM GOALS: 04/06/2021   Target Date 04/06/21      PT LONG TERM GOAL #2   Title Improve cervical AROM rotation to equal or greater than 60 degrees each direction to improve ability to check blind spot when driving, veiw surroundings, and allow patient to complete valued activities with less difficulty.    Baseline 30 L, 40 R (01/12/2021)    Time 12    Period Weeks    Status New    Target Date 04/06/21      PT LONG TERM GOAL #3   Title Patient will score equal or greater than 25/30 on Functional Gait Assessment to demonstrate  reduction in fall risk to low fall risk classification.    Baseline 11/30 (01/12/2021)    Time 12    Period Weeks    Status New      PT LONG TERM GOAL #4   Title Pt will increase LE strength of by at least 1/2 MMT grade in order to demonstrate improvement in strength, function, and general mobility    Baseline BIL hip flexion 3+, knee extension 4+ (01/13/2021)      PT LONG TERM GOAL #5   Title Demonstrate improved FOTO score to 53 or greater to demonstrate improvement in overall condition and self-reported functional ability    Baseline 45 (01/10/2021)    Time 12    Period Weeks    Status New                   Plan - 03/01/21 1210     Clinical Impression Statement [name] ~ 13 min late, stating she was daydreaming and accidentally drove past the clinic. Patient tolerated treatment well overall and reports reduced pain upon arrival but more even right and left. Continues to respond well to manual therapy. Session focused on pain relief as well as continuing to address vestibular deficits. Able to turn head faster today in VORx1 exercise. She reports improved stability of visual field as well. Patient did demonstrate some instability in gait upon first standing up and continues to report limitations due to imbalance and pain. Patient would benefit from continued management of limiting condition by skilled physical therapist to address remaining impairments and functional limitations to work towards stated goals and return to PLOF or maximal functional independence.    Personal Factors and Comorbidities Comorbidity 3+;Past/Current Experience;Time since onset of injury/illness/exacerbation    Comorbidities anxiety, aortic stenosis, depression, skin cancer, GERD, heart murmur, hyperchloremia, hypertension, neuropathy, skin cancer, type II diabetes mellitus, asthma, anxiety, left ventricular hypertrophy, diabetic neuropathy and per patient report unexplained weight loss (25-35lbs in past year)  and a heart murmur    Examination-Activity Limitations Bathing;Bend;Hygiene/Grooming;Dressing;Carry;Lift;Locomotion Level;Stairs;Squat;Stand;Transfers;Other   walking, stairs, lifting items including groceries, balance, bending over, moving her neck, performing ADL's such as getting dressed, showering, bathing.   Examination-Participation Restrictions Driving;Cleaning;Interpersonal Relationship;Yard Work;Community Activity;Laundry;Shop  Stability/Clinical Decision Making Unstable/Unpredictable    Rehab Potential Fair    PT Frequency 2x / week    PT Duration 12 weeks    PT Treatment/Interventions Cryotherapy;ADLs/Self Care Home Management;Moist Heat;Gait training;Stair training;Therapeutic activities;Functional mobility training;Therapeutic exercise;Balance training;Neuromuscular re-education;Patient/family education;Manual techniques;Passive range of motion;Vestibular;Dry needling;Biofeedback;Canalith Repostioning;DME Instruction    PT Next Visit Plan Progress vestibular, strength, and balance activities as tolerated.    PT Home Exercise Plan Medbridge Access Code: [email or web address]    Consulted and Agree with Plan of Care Patient             Patient will benefit from skilled therapeutic intervention in order to improve the following deficits and impairments:  Impaired perceived functional ability, Abnormal gait, Decreased activity tolerance, Decreased range of motion, Decreased strength, Dizziness, Hypomobility, Impaired sensation, Pain, Decreased balance, Decreased mobility, Difficulty walking, Impaired UE functional use  Visit Diagnosis: Unsteadiness on feet  Repeated falls  Muscle weakness (generalized)  Cervicalgia  Dizziness and giddiness     Problem List Patient Active Problem List   Diagnosis Date Noted   Cervical myelopathy ([place]) 11/15/2020   LVH (left ventricular hypertrophy) 10/14/2020   COVID-19 05/19/2020   MRSA cellulitis 04/19/2018   Decreased libido 02/04/2018    Uncontrolled type 2 diabetes mellitus with diabetic neuropathy, without long-term current use of insulin ([place]) 04/20/2017   H/O adenomatous polyp of colon 08/20/2015   Rupture of implant of right breast 05/18/2015   Anxiety disorder 04/26/2015   Allergic rhinitis 04/21/2015   Anemia 04/21/2015   Asthma 04/21/2015   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/21/2015   Chronic infection of sinus 04/21/2015   Depression 04/21/2015   GERD (gastroesophageal reflux disease) 04/21/2015   Headache 04/21/2015   Menopausal symptoms 04/21/2015   Obesity 04/21/2015   Palpitations 04/21/2015   Vitamin B12 deficiency 04/21/2015   Aortic stenosis 58/59/2924   Diastolic dysfunction 46/28/6381   Diabetes mellitus out of control ([place]) 06/12/2006   Barrett esophagus 06/13/2003   Essential hypertension 06/12/1998   Genital herpes 06/12/1998   Hyperlipidemia, mixed 06/12/1998    Everlean Alstrom. Graylon Good, PT, DPT 03/01/21, 12:14 PM   [place] [facility] [address] [place], [address] Phone: [phone]   Fax:  [phone]  Name: KALEIAH KUTZER MRN: [id number] Date of Birth: Oct 18, 1950

## 2021-03-02 ENCOUNTER — Other Ambulatory Visit: Payer: Self-pay | Admitting: Family Medicine

## 2021-03-02 DIAGNOSIS — IMO0002 Reserved for concepts with insufficient information to code with codable children: Secondary | ICD-10-CM

## 2021-03-02 DIAGNOSIS — E1140 Type 2 diabetes mellitus with diabetic neuropathy, unspecified: Secondary | ICD-10-CM

## 2021-03-02 NOTE — Telephone Encounter (Signed)
Patient has upcoming appointment 10/14- and will run out of medication before that time- 1 refill sent to pharmacy

## 2021-03-02 NOTE — Telephone Encounter (Signed)
LOV:  02/09/2021 NOV: 03/25/2021   Last Refill:  01/19/2021 #30 1 Refill.

## 2021-03-02 NOTE — Telephone Encounter (Signed)
Requested medication (s) are due for refill today - no  Requested medication (s) are on the active medication list -yes  Future visit scheduled -yes  Last refill: 01/19/21 #30 1 RF  Notes to clinic: Request RF: non delegated Rx  Requested Prescriptions  Pending Prescriptions Disp Refills   butalbital-acetaminophen-caffeine (FIORICET) 50-325-40 MG tablet [Pharmacy Med Name: BUTALB-ACETAMIN-CAFF 50-325-40] 30 tablet 1    Sig: TAKE 1 TABLET BY MOUTH EVERY 6 HOURS AS NEEDED FOR HEADACHE     Not Delegated - Analgesics:  Non-Opioid Analgesic Combinations Failed - 03/02/2021 10:22 AM      Failed - This refill cannot be delegated      Passed - Valid encounter within last 12 months    Recent Outpatient Visits           3 weeks ago Uncontrolled type 2 diabetes mellitus with diabetic neuropathy, without long-term current use of insulin ([place])   [facility] [name], MD   4 months ago Uncontrolled type 2 diabetes mellitus with diabetic neuropathy, without long-term current use of insulin ([place])   [facility] [name], MD   6 months ago Memory deficit   [facility] [name], MD   9 months ago Cough   [facility] [name], MD   1 year ago Acute non-recurrent pansinusitis   [facility], Clearnce Sorrel, [name]       Future Appointments             In 3 weeks Caryn Section, Kirstie Peri, MD [facility], [facility]            Signed Prescriptions Disp Refills   glipiZIDE (GLUCOTROL) 10 MG tablet 90 tablet 0    Sig: TAKE 1 TABLET BY MOUTH EVERY DAY BEFORE BREAKFAST     Endocrinology:  Diabetes - Sulfonylureas Failed - 03/02/2021 10:22 AM      Failed - HBA1C is between 0 and 7.9 and within 180 days    Hemoglobin A1C  Date Value Ref Range Status  02/09/2021 11.3 (A) 4.0 - 5.6 % Final  07/13/2016 8.5  Final   Hgb A1c MFr Bld  Date Value Ref Range Status  09/01/2020 9.2 (H) 4.8 -  5.6 % Final    Comment:             Prediabetes: 5.7 - 6.4          Diabetes: >6.4          Glycemic control for adults with diabetes: <7.0           Passed - Valid encounter within last 6 months    Recent Outpatient Visits           3 weeks ago Uncontrolled type 2 diabetes mellitus with diabetic neuropathy, without long-term current use of insulin ([place])   [facility] [name], MD   4 months ago Uncontrolled type 2 diabetes mellitus with diabetic neuropathy, without long-term current use of insulin [facility])   [facility] [name], MD   6 months ago Memory deficit   [facility] [name], MD   9 months ago Cough   [facility] [name], MD   1 year ago Acute non-recurrent pansinusitis   [place], Clearnce Sorrel, [name]       Future Appointments             In 3 weeks Lelon Huh  E, MD [place], [facility]             gabapentin (NEURONTIN) 300 MG capsule 360 capsule 0    Sig: TAKE 2 CAPSULES (600 MG TOTAL) BY MOUTH 2 (TWO) TIMES DAILY. MORNING & SUPPER     Neurology: Anticonvulsants - gabapentin Passed - 03/02/2021 10:22 AM      Passed - Valid encounter within last 12 months    Recent Outpatient Visits           3 weeks ago Uncontrolled type 2 diabetes mellitus with diabetic neuropathy, without long-term current use of insulin ([place])   [facility] [name], MD   4 months ago Uncontrolled type 2 diabetes mellitus with diabetic neuropathy, without long-term current use of insulin ([place])   [facility] [name], MD   6 months ago Memory deficit   [facility] [name], MD   9 months ago Cough   [facility] [name], MD   1 year ago Acute non-recurrent pansinusitis   [place], Clearnce Sorrel, [name]       Future Appointments              In 3 weeks Caryn Section, Kirstie Peri, MD [facility], [place]               Requested Prescriptions  Pending Prescriptions Disp Refills   butalbital-acetaminophen-caffeine (FIORICET) 50-325-40 MG tablet [Pharmacy Med Name: BUTALB-ACETAMIN-CAFF 50-325-40] 30 tablet 1    Sig: TAKE 1 TABLET BY MOUTH EVERY 6 HOURS AS NEEDED FOR HEADACHE     Not Delegated - Analgesics:  Non-Opioid Analgesic Combinations Failed - 03/02/2021 10:22 AM      Failed - This refill cannot be delegated      Passed - Valid encounter within last 12 months    Recent Outpatient Visits           3 weeks ago Uncontrolled type 2 diabetes mellitus with diabetic neuropathy, without long-term current use of insulin ([place])   [facility] [name], MD   4 months ago Uncontrolled type 2 diabetes mellitus with diabetic neuropathy, without long-term current use of insulin ([place])   [facility] [name], MD   6 months ago Memory deficit   [facility] [name], MD   9 months ago Cough   [facility] [name], MD   1 year ago Acute non-recurrent pansinusitis   [facility] [place], Clearnce Sorrel, [name]       Future Appointments             In 3 weeks Caryn Section, Kirstie Peri, MD [facility], [facility]            Signed Prescriptions Disp Refills   glipiZIDE (GLUCOTROL) 10 MG tablet 90 tablet 0    Sig: TAKE 1 TABLET BY MOUTH EVERY DAY BEFORE BREAKFAST     Endocrinology:  Diabetes - Sulfonylureas Failed - 03/02/2021 10:22 AM      Failed - HBA1C is between 0 and 7.9 and within 180 days    Hemoglobin A1C  Date Value Ref Range Status  02/09/2021 11.3 (A) 4.0 - 5.6 % Final  07/13/2016 8.5  Final   Hgb A1c MFr Bld  Date Value Ref Range Status  09/01/2020 9.2 (H) 4.8 - 5.6 % Final    Comment:             Prediabetes:  5.7 - 6.4          Diabetes: >6.4          Glycemic control for adults with diabetes: <7.0            Passed - Valid encounter within last 6 months    Recent Outpatient Visits           3 weeks ago Uncontrolled type 2 diabetes mellitus with diabetic neuropathy, without long-term current use of insulin ([place])   [facility] [name], MD   4 months ago Uncontrolled type 2 diabetes mellitus with diabetic neuropathy, without long-term current use of insulin [facility])   [facility] [name], MD   6 months ago Memory deficit   [facility] [name], MD   9 months ago Cough   [facility] [name], MD   1 year ago Acute non-recurrent pansinusitis   [place], [name]       Future Appointments             In 3 weeks Fisher, Kirstie Peri, MD [facility], [facility]             gabapentin (NEURONTIN) 300 MG capsule 360 capsule 0    Sig: TAKE 2 CAPSULES (600 MG TOTAL) BY MOUTH 2 (TWO) TIMES DAILY. MORNING & SUPPER     Neurology: Anticonvulsants - gabapentin Passed - 03/02/2021 10:22 AM      Passed - Valid encounter within last 12 months    Recent Outpatient Visits           3 weeks ago Uncontrolled type 2 diabetes mellitus with diabetic neuropathy, without long-term current use of insulin [facility])   [facility] [name], MD   4 months ago Uncontrolled type 2 diabetes mellitus with diabetic neuropathy, without long-term current use of insulin [facility])   [facility] [name], MD   6 months ago Memory deficit   [place] [facility] [name], MD   9 months ago Cough   [facility] [name], MD   1 year ago Acute non-recurrent pansinusitis   [facility] [place], Clearnce Sorrel, [name]       Future Appointments             In 3 weeks Caryn Section, Kirstie Peri, MD [facility], [place]

## 2021-03-03 ENCOUNTER — Ambulatory Visit: Payer: PPO | Admitting: Physical Therapy

## 2021-03-03 ENCOUNTER — Other Ambulatory Visit: Payer: Self-pay

## 2021-03-03 ENCOUNTER — Encounter: Payer: Self-pay | Admitting: Physical Therapy

## 2021-03-03 DIAGNOSIS — R42 Dizziness and giddiness: Secondary | ICD-10-CM

## 2021-03-03 DIAGNOSIS — M6281 Muscle weakness (generalized): Secondary | ICD-10-CM

## 2021-03-03 DIAGNOSIS — R296 Repeated falls: Secondary | ICD-10-CM

## 2021-03-03 DIAGNOSIS — R2681 Unsteadiness on feet: Secondary | ICD-10-CM | POA: Diagnosis not present

## 2021-03-03 DIAGNOSIS — M542 Cervicalgia: Secondary | ICD-10-CM

## 2021-03-03 NOTE — Therapy (Signed)
[facility] [address], [place], [address] Phone: [phone]   Fax:  [phone]  Physical Therapy Treatment / Progress Note Dates of reporting from 01/12/2021 - 03/03/2021  Patient Details  Name: Katie Buckley MRN: [id number] Date of Birth: December 13, 1950 Referring Provider (PT): Loleta Dicker, [place]   Encounter Date: 03/03/2021   PT End of Session - 03/03/21 1907     Visit Number 10    Number of Visits 24    Date for PT Re-Evaluation 04/06/21    Authorization Type [employer]: (Reporting period from 01/12/2021)    Progress Note Due on Visit 20    PT Start Time 1809    PT Stop Time 1850    PT Time Calculation (min) 41 min    Equipment Utilized During Treatment Gait belt    Activity Tolerance Patient tolerated treatment well    Behavior During Therapy [facility] for tasks assessed/performed             Past Medical History:  Diagnosis Date   Anxiety    Aortic stenosis, mild    a.) mean gradient 18.7 mmHg on 10/2020 TTE   Depression    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   T2DM (type 2 diabetes mellitus) ([place])     Past Surgical History:  Procedure Laterality Date   ANTERIOR CERVICAL DECOMP/DISCECTOMY FUSION N/A 11/15/2020   Procedure: ANTERIOR CERVICAL DECOMPRESSION/DISCECTOMY FUSION 1 LEVEL C3/4;  Surgeon: Deetta Perla, MD;  Location: [facility] ORS;  Service: Neurosurgery;  Laterality: N/A;   AUGMENTATION MAMMAPLASTY Bilateral    breast implants   Bone Spur  2007   foot   CARDIAC CATHETERIZATION     Carpal Tunnel Symdrome  2008   as repeated  in 2009   CATARACT EXTRACTION W/PHACO Left 01/16/2019   Procedure: CATARACT EXTRACTION PHACO AND INTRAOCULAR LENS PLACEMENT ([place]) LEFT DIABETES VISION BLUE;  Surgeon: Marchia Meiers, MD;  Location: [facility];  Service: Ophthalmology;  Laterality: Left;  [place]  00:47 CDE 6.62 Fluid pack lot # [id number]   CATARACT EXTRACTION W/PHACO Right 02/06/2019   Procedure: CATARACT EXTRACTION PHACO AND INTRAOCULAR LENS PLACEMENT (I[facility]);  Surgeon: Marchia Meiers, MD;  Location: [facility] ORS;  Service: Ophthalmology;  Laterality: Right;  [place] 00:54.6 CDE 6.52 FLUID PACK LOT # [id number]   CHOLECYSTECTOMY  2007   COLONOSCOPY WITH PROPOFOL N/A 11/17/2015   Procedure: COLONOSCOPY WITH PROPOFOL;  Surgeon: Manya Silvas, MD;  Location: [facility] ENDOSCOPY;  Service: Endoscopy;  Laterality: N/A;   ESOPHAGOGASTRODUODENOSCOPY (EGD) WITH PROPOFOL N/A 11/17/2015   Procedure: ESOPHAGOGASTRODUODENOSCOPY (EGD) WITH PROPOFOL;  Surgeon: Manya Silvas, MD;  Location: [facility] ENDOSCOPY;  Service: Endoscopy;  Laterality: N/A;   EYE SURGERY Bilateral    cataract extraction   FOOT SURGERY     MANDIBLE SURGERY     NASAL SINUS SURGERY  03/2013   Multile surgery Dr. Carlis Abbott. 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 Tamala Julian   TONSILLECTOMY  1960    There were no  vitals filed for this visit.   Subjective Assessment - 03/03/21 1911     Subjective Patient is doing pretty good today. She has had a couple days were she has felt off balance lately, but has not fallen since last PT appointment. She states that she had an opportunity to sit and catch balance when she felt off, which helped her not fall. However, patient states that she did not feel like she was having one of her typical episodes when she sat down and regained her balance. Patient reports her neck is feeling much better. Patient L neck pain/jaw pain is currently 3/10. Patient believes PT has been helpful for her balance and neck and would like to continue.     Pertinent History Patient is a 70 year old female who presents to outpatient physical therapy with a referral for s/p cervical spinal fusion (ANTERIOR CERVICAL DECOMPRESSION/DISCECTOMY FUSION 1 LEVEL C3/4, 11/15/2020), cervicalgia, multifactoral causes for imbalance, cervical myelopathy. This patient's chief complaints consist of imbalance, history of falls, neck pain, and neck hypomobility, leading to the following functional deficits: difficulty with walking, stairs, lifting items including groceries, balance, bending over, moving her neck, performing ADL's such as getting dressed, showering, bathing. Relevant past medical history and comorbidities include anxiety, aortic stenosis, depression, skin cancer, GERD, heart murmur, hyperchloremia, hypertension, neuropathy, skin cancer, type II diabetes mellitus, asthma, anxiety, left ventricular hypertrophy, diabetic neuropathy and per patient report unexplained weight loss (25-35lbs in past year) and a heart murmur. Patient denies history of stroke, seizure, lung problems, bowel or bladder changes, groin numbness or tingling.    Limitations Lifting;Walking;Standing;House hold activities;Other (comment)   walking, stairs, lifting items including groceries, balance, bending over, moving her neck, performing ADL's such as getting dressed, showering, bathing.   Patient Stated Goals "build up body strength and try to get imbalance under control"    Pain Score 3     Pain Location Neck    Pain Onset More than a month ago              OBJECTIVE:  FOTO SCORE: 48   SPINE MOTION Cervical Spine AROM R/L - (60) Cervical Flexion: 60  - (80) Cervical Extension: 35  - (45/45) Cervical Lateral Flexion: 35/40 - (85/85) Cervical Rotation (R/L) 71/65 (#) Average/norm value in degrees *Patient reports slight pain in L neck with L sided cervical rotation.    LE MUSCLE PERFORMANCE (MMT):  Patient would benefit from assessment of standing hip extension and  standing hip abduction at next session.    *Indicates pain 01/12/21 03/03/21 Date  Joint/Motion R/L R/L R/L  Hip        Flexion 3+/3+  3+/3+    Extension (knee ext)        Abduction        Adduction        Knee        Extension 4+/4+  5/5    Flexion 4/4  5/5    Ankle/Foot        Dorsiflexion  3+/3+  3+/4+    Great toe extension 5/5  5/5    Eversion 5/5  5/5    Plantarflexion 4/4  5/5        FUNCTIONAL/BALANCE TESTS: Functional Gait Assessment: 19/30 moderate fall risk   TREATMENT:  Manual therapy: to reduce pain and tissue tension, improve range of motion, neuromodulation, in order to promote improved ability to complete functional activities. SUPINE - STM to upper traps, cervical paraspinals, and suboccipitals including Suboccipital Release  to suboccipital muscles at the back of the head improve muscular tension and head aches.   Therapeutic exercise: to centralize symptoms and improve ROM, strength, muscular endurance, and activity tolerance required for successful completion of functional activities.   - Objective measurements completed on examination: See above findings.         PT Education - 03/03/21 1912     Education Details form/technique with exercise. Patient progress since starting PT.    Person(s) Educated Patient    Methods Explanation;Demonstration;Tactile cues;Verbal cues    Comprehension Verbalized understanding;Returned demonstration;Tactile cues required;Verbal cues required              PT Short Term Goals - 02/17/21 1619       PT SHORT TERM GOAL #1   Title Patient will be independent with inital home exercise program to improve strength/mobility/balance and functional independence with ADLs.    Baseline HEP to be provided at 2nd visit    Time 3    Period Weeks    Status Achieved    Target Date 02/03/21               PT Long Term Goals - 03/03/21 1901       PT LONG TERM GOAL #1   Title Patient will be independent with long term  home exercise program to improve strength/mobility/balance and functional independence with ADLs.    Baseline Patient reports HEP compliance (03/03/2021).    Time 12    Period Weeks    Status On-going   TARGET DATE FOR ALL LONG TERM GOALS: 04/06/2021     PT LONG TERM GOAL #2   Title Improve cervical AROM rotation to equal or greater than 60 degrees each direction to improve ability to check blind spot when driving, veiw surroundings, and allow patient to complete valued activities with less difficulty.    Baseline 30 L, 40 R (01/12/2021); 65 L, 71 R. Patient reports pain with L rotation (03/03/2021)    Time 12    Period Weeks    Status Achieved      PT LONG TERM GOAL #3   Title Patient will score equal or greater than 25/30 on Functional Gait Assessment to demonstrate reduction in fall risk to low fall risk classification.    Baseline 11/30 (01/12/2021); 19/30 (03/03/2021)    Time 12    Period Weeks    Status Partially Met      PT LONG TERM GOAL #4   Title Pt will increase LE strength of by at least 1/2 MMT grade in order to demonstrate improvement in strength, function, and general mobility    Baseline BIL hip flexion 3+, knee extension 4+ (01/13/2021); BIL hip flexion 3+, BIL knee extension 5/5 (03/03/2021).    Time 12    Period Weeks    Status Partially Met      PT LONG TERM GOAL #5   Title Demonstrate improved FOTO score to 53 or greater to demonstrate improvement in overall condition and self-reported functional ability    Baseline 45 (01/10/2021); 48 (03/03/2021)    Time 12    Period Weeks    Status Partially Met                   Plan - 03/03/21 1931     Clinical Impression Statement Patient has attended 10 physical therapy sessions this episode of care and overall is making progress towards goals. Patient has made improvement in FOTO score from 45 (01/10/2021) to 48 (03/03/2021)  indicating improvement in overall condition since starting PT, but her scores suggest that her    functional ability remains limited. Additionally, patient made significant improvements in FGA score from 11 (01/12/2021) to 19 (03/03/2021) demonstrating patient improvements in dynamic balance. However, patient is still at moderate risk for falls as evidenced by FGA score. Patient made improvements in LE bilateral knee extension/flexion, but patient hip strength remains limited and impacts patient function, mobility, and balance as well. Patient demonstrated significant improvements in cervical ROM flexion and L/R rotation. However, patient L neck pain continues to impact patient ability to complete valued activities without pain. Patient continues to present with significant pain, disturbance of sensation, motor control, balance, gait, ROM, muscle tension, muscle performance (strength/power/endurance) and activity tolerance impairments that are limiting ability to complete his usual activities. Patient's condition has the potential to improve in response to therapy and maximum improvement is yet to be obtained. Patient would benefit from continued management of limiting condition by skilled physical therapist to address remaining impairments and functional limitations to work towards stated goals and return to PLOF or maximal functional independence.    Personal Factors and Comorbidities Comorbidity 3+;Past/Current Experience;Time since onset of injury/illness/exacerbation    Comorbidities anxiety, aortic stenosis, depression, skin cancer, GERD, heart murmur, hyperchloremia, hypertension, neuropathy, skin cancer, type II diabetes mellitus, asthma, anxiety, left ventricular hypertrophy, diabetic neuropathy and per patient report unexplained weight loss (25-35lbs in past year) and a heart murmur    Examination-Activity Limitations Bathing;Bend;Hygiene/Grooming;Dressing;Carry;Lift;Locomotion Level;Stairs;Squat;Stand;Transfers;Other   walking, stairs, lifting items including groceries, balance, bending over, moving  her neck, performing ADL's such as getting dressed, showering, bathing.   Examination-Participation Restrictions Driving;Cleaning;Interpersonal Relationship;Yard Work;Community Activity;Laundry;Shop    Stability/Clinical Decision Making Unstable/Unpredictable    Rehab Potential Fair    PT Frequency 2x / week    PT Duration 12 weeks    PT Treatment/Interventions Cryotherapy;ADLs/Self Care Home Management;Moist Heat;Gait training;Stair training;Therapeutic activities;Functional mobility training;Therapeutic exercise;Balance training;Neuromuscular re-education;Patient/family education;Manual techniques;Passive range of motion;Vestibular;Dry needling;Biofeedback;Canalith Repostioning;DME Instruction    PT Next Visit Plan Progress vestibular, strength, and balance activities as tolerated.    PT Home Exercise Plan Medbridge Access Code: [email or web address]    Consulted and Agree with Plan of Care Patient             Patient will benefit from skilled therapeutic intervention in order to improve the following deficits and impairments:  Impaired perceived functional ability, Abnormal gait, Decreased activity tolerance, Decreased range of motion, Decreased strength, Dizziness, Hypomobility, Impaired sensation, Pain, Decreased balance, Decreased mobility, Difficulty walking, Impaired UE functional use  Visit Diagnosis: Unsteadiness on feet  Repeated falls  Cervicalgia  Dizziness and giddiness  Muscle weakness (generalized)     Problem List Patient Active Problem List   Diagnosis Date Noted   Cervical myelopathy ([place]) 11/15/2020   LVH (left ventricular hypertrophy) 10/14/2020   COVID-19 05/19/2020   MRSA cellulitis 04/19/2018   Decreased libido 02/04/2018   Uncontrolled type 2 diabetes mellitus with diabetic neuropathy, without long-term current use of insulin ([place]) 04/20/2017   H/O adenomatous polyp of colon 08/20/2015   Rupture of implant of right breast 05/18/2015   Anxiety disorder  04/26/2015   Allergic rhinitis 04/21/2015   Anemia 04/21/2015   Asthma 04/21/2015   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/21/2015   Chronic infection of sinus 04/21/2015   Depression 04/21/2015   GERD (gastroesophageal reflux disease) 04/21/2015   Headache 04/21/2015   Menopausal symptoms 04/21/2015   Obesity 04/21/2015   Palpitations 04/21/2015   Vitamin B12 deficiency 04/21/2015  Aortic stenosis 80/22/1798   Diastolic dysfunction 04/05/4861   Diabetes mellitus out of control ([place]) 06/12/2006   Barrett esophagus 06/13/2003   Essential hypertension 06/12/1998   Genital herpes 06/12/1998   Hyperlipidemia, mixed 06/12/1998     Sherryll Burger, SPT  Student physical therapist under direct supervision of licensed physical therapists during the entirety of the session.   Everlean Alstrom. Graylon Good, PT, DPT 03/03/21, 7:39 PM  [facility] [address] [place], [address] Phone: [phone]   Fax:  [phone]  Name: Katie Buckley MRN: [id number] Date of Birth: December 18, 1950

## 2021-03-04 ENCOUNTER — Telehealth: Payer: PPO

## 2021-03-04 NOTE — Progress Notes (Deleted)
Chronic Care Management Pharmacy Note  03/04/2021 Name:  Katie Buckley MRN:  [id number] DOB:  Aug 31, 1950  Summary: ***  Recommendations/Changes made from today's visit: ***  Plan: ***   Subjective: Katie Buckley is an 70 y.o. year old female who is a primary patient of Fisher, Kirstie Peri, MD.  The CCM team was consulted for assistance with disease management and care coordination needs.    Engaged with patient by telephone for initial visit in response to provider referral for pharmacy case management and/or care coordination services.   Consent to Services:  The patient was given the following information about Chronic Care Management services today, agreed to services, and gave verbal consent: 1. CCM service includes personalized support from designated clinical staff supervised by the primary care provider, including individualized plan of care and coordination with other care providers 2. 24/7 contact phone numbers for assistance for urgent and routine care needs. 3. Service will only be billed when office clinical staff spend 20 minutes or more in a month to coordinate care. 4. Only one practitioner may furnish and bill the service in a calendar month. 5.The patient may stop CCM services at any time (effective at the end of the month) by phone call to the office staff. 6. The patient will be responsible for cost sharing (co-pay) of up to 20% of the service fee (after annual deductible is met). Patient agreed to services and consent obtained.  Patient Care Team: [name], MD as PCP - General (Family Medicine) Chauncey Mann, MD as Referring Physician (Psychiatry) Vladimir Crofts, MD as Consulting Physician (Neurology) Deetta Perla, MD as Consulting Physician (Neurosurgery) Germaine Pomfret, [place] (Pharmacist)  Recent office visits: 02/09/21: Patient presented to Dr. Caryn Section for follow-up. A1c 11.3%. LDL improved to 108. Mounjaro 2.5 mg weekly started.   10/27/2020  Lelon Huh, MD (PCP Office visit) for Annual Exam- Ozempic discontinued; Increased jardiance to 25 mg 1 tablet daily; Metformin increased to 500 mg 2 tablets daily; US Carotid Duplex Bilateral ordered; A1C lab ordered   10/15/2020 Lelon Huh, MD (PCP Patient Message) Started Irbesartan 75 mg daily   09/02/2020 Althea Charon, CMA (PCP telephone call) labs discussed Neurology referral Ordered   09/01/2020 Lelon Huh, MD (PCP Office visit) for Memory loss- Discontinued: Aripiprazole 5 mg Daily, Hydrocodone-Homatropine 5-1.5 mg, Hydroxyzine HCl 50 mg due to patient not taking; labs ordered  Recent consult visits: 12/28/2020 Malen Gauze, MD (Neurosurgery) No medication changes noted- Patient instructed to return in 6 weeks   11/30/2020 Malen Gauze, MD (Neurosurgery) - for Post Operative Visit- No medication changes noted- Patient instructed to follow-up in 3 weeks   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 Discharge date was 11/16/2020. Discharged from [facility].     New?Medications Started at [facility] Discharge:?? -START taking: methocarbamol (ROBAXIN)Take 1 tablet (500 mg total) by mouth every 6 (six) hours as needed for muscle spasms. oxyCODONE (Oxy IR/ROXICODONE) Take 1 tablet (5 mg total) by mouth every 6 (six) hours as needed for moderate pain senna (SENOKOT) Take 1 tablet (8.6 mg total) by mouth 2 (two) times daily. Medication Changes at    -Changed None ID   Medications Discontinued at Hospital Discharge: -STOP taking: meloxicam 15 MG tablet (MOBIC)   Medications that remain the same after Hospital Discharge:??  -All  other medications will remain the same.    Admitted to the hospital on 09/29/2020 due to Hyperglycemia, Hypertension and Nausea.  Discharge date was 09/29/2020. Discharged from [facility] Emergency Department.     New?Medications Started at [facility] Discharge:?? -started ondansetron 4 MG disintegrating tablet due to Nausea   Medication Changes at Hospital Discharge: -Changed None ID   Medications Discontinued at Hospital Discharge: -Stopped None ID   Medications that remain the same after Hospital Discharge:??  -All other medications will remain the same.   Objective:  Lab Results  Component Value Date   CREATININE 0.84 02/09/2021   BUN 11 02/09/2021   GFRNONAA >60 11/15/2020   GFRAA 89 11/19/2019   NA 135 02/09/2021   K 4.9 02/09/2021   CALCIUM 9.5 02/09/2021   CO2 24 02/09/2021   GLUCOSE 353 (H) 02/09/2021    Lab Results  Component Value Date/Time   HGBA1C 11.3 (A) 02/09/2021 09:49 AM   HGBA1C 9.9 (A) 10/27/2020 09:48 AM   HGBA1C 9.2 (H) 09/01/2020 09:07 AM   HGBA1C 11.8 (H) 06/17/2019 03:44 PM   HGBA1C 8.5 07/13/2016 12:00 AM   HGBA1C 9.2 (H) 03/27/2013 06:21 PM    Last diabetic Eye exam:  Lab Results  Component Value Date/Time   HMDIABEYEEXA Retinopathy (A) 06/30/2020 12:00 AM    Last diabetic Foot exam: No results found for: HMDIABFOOTEX   Lab Results  Component Value Date   CHOL 236 (H) 02/09/2021   HDL 88 02/09/2021   LDLCALC 108 (H) 02/09/2021   TRIG 238 (H) 02/09/2021   CHOLHDL 2.7 02/09/2021    Hepatic Function Latest Ref Rng & Units 02/09/2021 09/01/2020 05/31/2020  Total Protein 6.0 - 8.5 g/dL - 6.3 7.4  Albumin 3.8 - 4.8 g/dL 4.5 4.2 4.0  AST 0 - 40 IU/L - 17 22  ALT 0 - 32 IU/L - 15 17  Alk Phosphatase 44 - 121 IU/L - 111 79  Total Bilirubin 0.0 - 1.2 mg/dL - 0.2 0.6    Lab Results  Component Value Date/Time   TSH 1.650 09/01/2020 09:07 AM   TSH 1.380 11/19/2019 09:58 AM    CBC Latest Ref Rng & Units 11/03/2020 09/29/2020 09/01/2020  WBC 4.0 - 10.5 K/uL 7.4 10.8(H) 6.7  Hemoglobin 12.0 - 15.0 g/dL 11.8(L) 13.3 11.2  Hematocrit 36.0 - 46.0 %  35.2(L) 39.9 35.2  Platelets 150 - 400 K/uL 202 278 212    Lab Results  Component Value Date/Time   VD25OH 31.5 09/01/2020 09:07 AM    Clinical ASCVD: {YES/NO:21197} The 10-year ASCVD risk score (Arnett DK, et al., 2019) is: 32.7%   Values used to calculate the score:     Age: 29 years     Sex: Female     Is Non-Hispanic African American: No     Diabetic: Yes     Tobacco smoker: No     Systolic Blood Pressure: 627 mmHg     Is BP treated: Yes     HDL Cholesterol: 88 mg/dL     Total Cholesterol: 236 mg/dL    Depression screen [facility] 2/9 02/09/2021 10/27/2020 09/01/2020  Decreased Interest _0 Down, Depressed, Hopeless _1 PHQ - 2 Score _2 Altered sleeping 0 1 1  Tired, decreased energy 0 1 2  Change in appetite 0 1 1  Feeling bad or failure about yourself  0 1 1  Trouble concentrating 0 1 1  Moving slowly or fidgety/restless 0 0 1  Suicidal thoughts 0 0 0  PHQ-9 Score _0 Difficult doing work/chores Not difficult at all Somewhat difficult Somewhat difficult  Some recent data might be hidden     ***Other: (CHADS2VASc if Afib, MMRC or CAT for COPD, ACT, DEXA)  Social History   Tobacco Use  Smoking Status Never  Smokeless Tobacco Never   BP Readings from Last 3 Encounters:  02/09/21 (!) 171/54  11/16/20 (!) 137/49  11/03/20 (!) 156/65   Pulse Readings from Last 3 Encounters:  02/09/21 66  11/16/20 77  11/03/20 63   Wt Readings from Last 3 Encounters:  02/09/21 143 lb (64.9 kg)  11/15/20 142 lb (64.4 kg)  11/03/20 141 lb (64 kg)   BMI Readings from Last 3 Encounters:  02/09/21 27.93 kg/m  11/15/20 27.73 kg/m  11/03/20 27.54 kg/m    Assessment/Interventions: Review of patient past medical history, allergies, medications, health status, including review of consultants reports, laboratory and other test data, was performed as part of comprehensive evaluation and provision of chronic care management services.   SDOH:  (Social Determinants of  Health) assessments and interventions performed: {yes/no:20286}  SDOH Screenings   Alcohol Screen: Low Risk    Last Alcohol Screening Score (AUDIT): 0  Depression (PHQ2-9): Low Risk    PHQ-2 Score: 2  Financial Resource Strain: Not on file  Food Insecurity: Not on file  Housing: Not on file  Physical Activity: Not on file  Social Connections: Not on file  Stress: Not on file  Tobacco Use: Low Risk    Smoking Tobacco Use: Never   Smokeless Tobacco Use: Never  Transportation Needs: Not on file    CCM Care Plan  Allergies  Allergen Reactions   Doxycycline Other (See Comments)    Emotional changes/anxious   Biaxin [Clarithromycin]     GI upset, and bad taste in mouth.    Medications Reviewed Today     Reviewed by Sherryll Burger, Student-PT (Student-PT) on 03/03/21 at [place] List Status: <None>   Medication Order Taking? Sig Documenting Provider Last Dose Status Informant  azelastine (ASTELIN) 0.1 % nasal spray [id number] No Place 1-2 sprays into both nostrils every 12 (twelve) hours as needed. [provider] Taking Active Self  buPROPion (WELLBUTRIN SR) 100 MG 12 hr tablet [id number] No Take 100 mg by mouth 2 (two) times daily. [provider] Taking Active Self           Med Note Lynetta Mare Feb 09, 2021  9:42 AM) Patient taking 150 mg BID  butalbital-acetaminophen-caffeine (FIORICET) 50-325-40 MG tablet [id number]  TAKE 1 TABLET BY MOUTH EVERY 6 HOURS AS NEEDED FOR HEADACHE [name], MD  Active   empagliflozin (JARDIANCE) 25 MG TABS tablet [id number] No Take 1 tablet (25 mg total) by mouth daily. [name], MD Taking Active Self  Esomeprazole Magnesium 20 MG TBEC [id number] No Take 20 mg by mouth daily before breakfast. [provider] Taking Active Self  ezetimibe (ZETIA) 10 MG tablet [id number] No TAKE 1 TABLET BY MOUTH EVERYDAY AT BEDTIME  Patient taking differently: Take 10 mg by mouth at bedtime.   [name],  MD Taking Active   fexofenadine [facility]) 180 MG tablet [id number] No Take 180 mg by mouth daily as needed for allergies or rhinitis. [provider] Taking Active Self  fluconazole (DIFLUCAN) 150 MG tablet [id number] No Take one tablet on day you receive medication Take second tablet three days later Take third  and last tablet three days later Gwyneth Sprout, FNP Taking Active   gabapentin (NEURONTIN) 300 MG capsule [id number]  TAKE 2 CAPSULES (600 MG TOTAL) BY MOUTH 2 (TWO) TIMES DAILY. MORNING & SUPPER [name], MD  Active   glipiZIDE (GLUCOTROL) 10 MG tablet [id number]  TAKE 1 TABLET BY MOUTH EVERY DAY BEFORE BREAKFAST [name], MD  Active   hydrOXYzine (VISTARIL) 25 MG capsule [id number] No Take 25 mg by mouth daily as needed for anxiety. [provider] Taking Active Self  irbesartan (AVAPRO) 75 MG tablet [id number] No TAKE 1 TABLET BY MOUTH EVERY DAY [name], MD Taking Active   Lancet Devices MISC [id number] No  [provider] Taking Active Self           Med Note Kenton Kingfisher, Earley Favor   Fri Aug 23, 2018 10:57 AM)    metFORMIN (GLUCOPHAGE-XR) 500 MG 24 hr tablet [id number] No Take 2 tablets (1,000 mg total) by mouth daily.  Patient taking differently: Take 500 mg by mouth daily.   [name], MD Taking Active   methocarbamol (ROBAXIN) 500 MG tablet [id number] No Take 1 tablet (500 mg total) by mouth every 6 (six) hours as needed for muscle spasms. Deetta Perla, MD Taking Active   metoprolol-hydrochlorothiazide (LOPRESSOR HCT) 100-25 MG tablet [id number] No Take 0.5 tablets by mouth daily. [name], MD Taking Active Self  montelukast (SINGULAIR) 10 MG tablet [id number] No TAKE 1 TABLET BY MOUTH EVERY DAY  Patient taking differently: Take 10 mg by mouth daily as needed (allergies).   [name], MD Taking Active   Olopatadine HCl 0.7 % SOLN [id number] No Apply 1 drop to eye daily as needed. [provider] Taking Active Self   ondansetron (ZOFRAN ODT) 4 MG disintegrating tablet [id number] No Take 1 tablet (4 mg total) by mouth every 8 (eight) hours as needed for nausea or vomiting. Harvest Dark, MD Taking Active Self  ondansetron (ZOFRAN) 4 MG tablet [id number] No TAKE 1 TABLET BY MOUTH EVERY 8 HOURS AS NEEDED [name], MD Taking Active   oxyCODONE (OXY IR/ROXICODONE) 5 MG immediate release tablet [id number] No Take 1 tablet (5 mg total) by mouth every 6 (six) hours as needed for moderate pain ((score 4 to 6)). Deetta Perla, MD Taking Active   rosuvastatin (CRESTOR) 20 MG tablet [id number]  TAKE 1 TABLET BY MOUTH EVERY DAY IN THE Georgianne Fick, MD  Active   senna (SENOKOT) 8.6 MG TABS tablet [id number] No Take 1 tablet (8.6 mg total) by mouth 2 (two) times daily. Deetta Perla, MD Taking Active   sertraline (ZOLOFT) 100 MG tablet [id number] No Take 200 mg by mouth daily. [provider] Taking Active Self  tirzepatide Darcel Bayley) 2.5 MG/0.5ML Pen [id number]  Inject 2.5 mg into the skin once a week. [name], MD  Active   traZODone (DESYREL) 50 MG tablet [id number] No Take 50-100 mg by mouth at bedtime as needed for sleep.  [provider] Taking Active Self  valACYclovir (VALTREX) 1000 MG tablet [id number] No Take 1 tablet daily for 5 days as needed [name], MD Taking Active Self            Patient Active Problem List   Diagnosis Date Noted   Cervical myelopathy ([place]) 11/15/2020   LVH (left ventricular hypertrophy) 10/14/2020   COVID-19 05/19/2020   MRSA cellulitis 04/19/2018   Decreased libido 02/04/2018   Uncontrolled type 2 diabetes mellitus with  diabetic neuropathy, without long-term current use of insulin ([place]) 04/20/2017   H/O adenomatous polyp of colon 08/20/2015   Rupture of implant of right breast 05/18/2015   Anxiety disorder 04/26/2015   Allergic rhinitis 04/21/2015   Anemia 04/21/2015   Asthma 04/21/2015   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/21/2015    Chronic infection of sinus 04/21/2015   Depression 04/21/2015   GERD (gastroesophageal reflux disease) 04/21/2015   Headache 04/21/2015   Menopausal symptoms 04/21/2015   Obesity 04/21/2015   Palpitations 04/21/2015   Vitamin B12 deficiency 04/21/2015   Aortic stenosis 19/37/9024   Diastolic dysfunction 09/73/5329   Diabetes mellitus out of control ([place]) 06/12/2006   Barrett esophagus 06/13/2003   Essential hypertension 06/12/1998   Genital herpes 06/12/1998   Hyperlipidemia, mixed 06/12/1998    Immunization History  Administered Date(s) Administered   Fluad Quad(high Dose 65+) 02/09/2021   Hep A / Hep B 02/15/2011, 03/29/2011, 09/06/2011   Influenza, High Dose Seasonal PF 04/18/2017   Influenza,inj,Quad PF,6+ Mos 04/23/2015, 02/17/2016   Influenza-Unspecified 03/25/2019   PFIZER(Purple Top)SARS-COV-2 Vaccination 08/03/2019, 08/27/2019   Pneumococcal Conjugate-13 04/18/2017   Pneumococcal Polysaccharide-23 11/27/2011, 03/29/2018   Tdap 10/09/2007   Zoster, Live 11/27/2011    Conditions to be addressed/monitored:  {USCCMDZASSESSMENTOPTIONS:23563}  There are no care plans that you recently modified to display for this patient.    Medication Assistance: {MEDASSISTANCEINFO:25044}  Compliance/Adherence/Medication fill history: Care Gaps: ***  Star-Rating Drugs: ***  Patient's preferred pharmacy is:  [id number]: [phone]: [phone]  [id number] [address] [address]: [phone]: [phone] pill box? {Yes or If no, why not?:20788} Pt endorses ***% compliance  We discussed: {Pharmacy options:24294} Patient decided to: {US Pharmacy Plan:23885}  Care Plan and Follow Up Patient Decision:  {FOLLOWUP:24991}  Plan: {CM FOLLOW UP PLHYIF:02774}***  Current Barriers:   {pharmacybarriers:24917}  Pharmacist Clinical Goal(s):  Patient will {PHARMACYGOALCHOICES:24921} through collaboration with PharmD and provider.   Interventions: 1:1 collaboration with FiBirdie SonsMD regarding development and update of comprehensive plan of care as evidenced by provider attestation and co-signature Inter-disciplinary care team collaboration (see longitudinal plan of care) Comprehensive medication review performed; medication list updated in electronic medical record  Hypertension (BP goal {CHL HP UPSTREAM Pharmacist BP ranges:[phone]}) -{US controlled/uncontrolled:25276} -Current treatment: Irbesartan 75 mg daily  Metoprolol-HCTZ 100-25 mg daily   -Medications previously tried: ***  -Current home readings: *** -Current dietary habits: *** -Current exercise habits: *** -{ACTIONS;DENIES/REPORTS:21021675::"Denies"} hypotensive/hypertensive symptoms -Educated on {CCM BP Counseling:25124} -Counseled to monitor BP at home ***, document, and provide log at future appointments -{CCMPHARMDINTERVENTION:25122}  Hyperlipidemia: (LDL goal < ***) -{US controlled/uncontrolled:25276} -Current treatment: Ezetimibe 10 mg daily  Rosuvastatin 20 mg daily  -Medications previously tried: ***  -Current dietary patterns: *** -Current exercise habits: *** -Educated on {CCM HLD Counseling:25126} -{CCMPHARMDINTERVENTION:25122}  Diabetes (A1c goal {A1c goals:23924}) -{US controlled/uncontrolled:25276} -Current medications: Jardiance 25 mg daily  Metformin XR 500 mg 2 tablets daily  Mounjaro 2.5 mg weekly  Glipizide 10 mg daily  -Medications previously tried: ***  -Current home glucose readings fasting glucose: *** post prandial glucose: *** -{ACTIONS;DENIES/REPORTS:21021675::"Denies"} hypoglycemic/hyperglycemic symptoms -Current meal patterns:  breakfast: ***  lunch: ***  dinner: *** snacks: *** drinks: *** -Current exercise: *** -Educated on {CCM DM  COUNSELING:25123} -Counseled to check feet daily and get yearly eye exams -{CCMPHARMDINTERVENTION:25122}  Depression/Anxiety (Goal: ***) -{US controlled/uncontrolled:25276} -Current  treatment: Sertraline 100 mg daily  -Medications previously tried/failed: *** -PHQ9: *** -GAD7: *** -Connected with *** for mental health support -Educated on {CCM mental health counseling:25127} -{CCMPHARMDINTERVENTION:25122}   Patient Goals/Self-Care Activities Patient will:  - {pharmacypatientgoals:24919}  Follow Up Plan: {CM FOLLOW UP CMKL:49179}

## 2021-03-08 ENCOUNTER — Telehealth: Payer: Self-pay | Admitting: Physical Therapy

## 2021-03-08 ENCOUNTER — Ambulatory Visit: Payer: PPO | Admitting: Physical Therapy

## 2021-03-08 DIAGNOSIS — G20 Parkinson's disease: Secondary | ICD-10-CM | POA: Diagnosis not present

## 2021-03-08 DIAGNOSIS — G629 Polyneuropathy, unspecified: Secondary | ICD-10-CM | POA: Diagnosis not present

## 2021-03-08 DIAGNOSIS — E569 Vitamin deficiency, unspecified: Secondary | ICD-10-CM | POA: Diagnosis not present

## 2021-03-08 NOTE — Telephone Encounter (Signed)
Called patient when she did not show up for her appointment scheduled today at 4:45pm. Left VM notifying her of missed visit and reminding her of her next appointment scheduled Mar 15, 2021 at 4pm. Also asked her to call back if she wanted to reschedule today's missed session to tomorrow.   Katie Buckley. Graylon Good, PT, DPT 03/08/21, 5:08 PM

## 2021-03-10 DIAGNOSIS — R55 Syncope and collapse: Secondary | ICD-10-CM | POA: Diagnosis not present

## 2021-03-11 ENCOUNTER — Telehealth: Payer: Self-pay | Admitting: Family Medicine

## 2021-03-11 DIAGNOSIS — I471 Supraventricular tachycardia: Secondary | ICD-10-CM

## 2021-03-11 DIAGNOSIS — R55 Syncope and collapse: Secondary | ICD-10-CM

## 2021-03-11 NOTE — Telephone Encounter (Signed)
Holter monitor shows runs of supraventricular tachycardia. Not sure if this is related to her spell, but she needs follow up with cardiology. Please place referral for syncope and SVT and fax copy of Zio monitor to cardiologist.

## 2021-03-11 NOTE — Telephone Encounter (Signed)
Pt advised.  She would like to see Dr. Clayborn Bigness.

## 2021-03-15 ENCOUNTER — Ambulatory Visit: Payer: PPO | Admitting: Physical Therapy

## 2021-03-15 DIAGNOSIS — F331 Major depressive disorder, recurrent, moderate: Secondary | ICD-10-CM | POA: Diagnosis not present

## 2021-03-15 DIAGNOSIS — F411 Generalized anxiety disorder: Secondary | ICD-10-CM | POA: Diagnosis not present

## 2021-03-16 ENCOUNTER — Ambulatory Visit: Payer: PPO | Attending: Neurosurgery | Admitting: Physical Therapy

## 2021-03-16 ENCOUNTER — Encounter: Payer: Self-pay | Admitting: Physical Therapy

## 2021-03-16 ENCOUNTER — Telehealth: Payer: Self-pay | Admitting: Family Medicine

## 2021-03-16 DIAGNOSIS — R296 Repeated falls: Secondary | ICD-10-CM | POA: Diagnosis not present

## 2021-03-16 DIAGNOSIS — R2681 Unsteadiness on feet: Secondary | ICD-10-CM | POA: Diagnosis not present

## 2021-03-16 DIAGNOSIS — R42 Dizziness and giddiness: Secondary | ICD-10-CM | POA: Diagnosis not present

## 2021-03-16 DIAGNOSIS — M6281 Muscle weakness (generalized): Secondary | ICD-10-CM | POA: Diagnosis not present

## 2021-03-16 DIAGNOSIS — M542 Cervicalgia: Secondary | ICD-10-CM | POA: Diagnosis not present

## 2021-03-16 NOTE — Therapy (Signed)
[facility] [address], [place], [address] Phone: [phone]   Fax:  [phone]  Physical Therapy Treatment  Patient Details  Name: Katie Buckley MRN: [id number] Date of Birth: [birth year] Referring Provider (PT): Loleta Dicker, [place]   Encounter Date: 03/16/2021   PT End of Session - 03/16/21 1150     Visit Number 11    Number of Visits 24    Date for PT Re-Evaluation 04/06/21    Authorization Type [employer]: (Reporting period from 03/03/2021)    Progress Note Due on Visit 20    PT Start Time 1122    PT Stop Time 1205    PT Time Calculation (min) 43 min    Equipment Utilized During Treatment Gait belt    Activity Tolerance Patient tolerated treatment well    Behavior During Therapy [facility] for tasks assessed/performed             Past Medical History:  Diagnosis Date   Anxiety    Aortic stenosis, mild    a.) mean gradient 18.7 mmHg on 10/2020 TTE   Depression    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   T2DM (type 2 diabetes mellitus) ([place])     Past Surgical History:  Procedure Laterality Date   ANTERIOR CERVICAL DECOMP/DISCECTOMY FUSION N/A 11/15/2020   Procedure: ANTERIOR CERVICAL DECOMPRESSION/DISCECTOMY FUSION 1 LEVEL C3/4;  Surgeon: Deetta Perla, MD;  Location: [facility] ORS;  Service: Neurosurgery;  Laterality: N/A;   AUGMENTATION MAMMAPLASTY Bilateral    breast implants   Bone Spur  2007   foot   CARDIAC CATHETERIZATION     Carpal Tunnel Symdrome  2008   as repeated in 2009   CATARACT EXTRACTION W/PHACO Left 01/16/2019    Procedure: CATARACT EXTRACTION PHACO AND INTRAOCULAR LENS PLACEMENT ([place]) LEFT DIABETES VISION BLUE;  Surgeon: Marchia Meiers, MD;  Location: [facility] ORS;  Service: Ophthalmology;  Laterality: Left;  [place]  00:47 CDE 6.62 Fluid pack lot # [id number]   CATARACT EXTRACTION W/PHACO Right 02/06/2019   Procedure: CATARACT EXTRACTION PHACO AND INTRAOCULAR LENS PLACEMENT ([facility]);  Surgeon: Marchia Meiers, MD;  Location: [facility] ORS;  Service: Ophthalmology;  Laterality: Right;  [place] 00:54.6 CDE 6.52 FLUID PACK LOT # [id number]   CHOLECYSTECTOMY  2007   COLONOSCOPY WITH PROPOFOL N/A 11/17/2015   Procedure: COLONOSCOPY WITH PROPOFOL;  Surgeon: Manya Silvas, MD;  Location: [facility] ENDOSCOPY;  Service: Endoscopy;  Laterality: N/A;   ESOPHAGOGASTRODUODENOSCOPY (EGD) WITH PROPOFOL N/A 11/17/2015   Procedure: ESOPHAGOGASTRODUODENOSCOPY (EGD) WITH PROPOFOL;  Surgeon: Manya Silvas, MD;  Location: [facility] ENDOSCOPY;  Service: Endoscopy;  Laterality: N/A;   EYE SURGERY Bilateral    cataract extraction   FOOT SURGERY     MANDIBLE SURGERY     NASAL SINUS SURGERY  03/2013   Multile surgery Dr. Carlis Abbott. 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 Tamala Julian   TONSILLECTOMY  1960    There were no vitals filed for this visit.   Subjective Assessment -  03/16/21 1123     Subjective Patient reports her neck is feeling better and her pain is 1/10 currently. States she has been referred to a cardiologist after wearing a heart monitor revealed some runs of supraventricular tachycardia. She states she continues to feel unsteady at time and had a couple of times where she felt she may fall. Things have been very busy in her personal life and she has not been able to do her HEP as much as she would like.    Pertinent History Patient is a 71 year old female who presents to outpatient physical therapy with a referral for s/p cervical spinal fusion (ANTERIOR CERVICAL DECOMPRESSION/DISCECTOMY FUSION 1 LEVEL C3/4,  11/15/2020), cervicalgia, multifactoral causes for imbalance, cervical myelopathy. This patient's chief complaints consist of imbalance, history of falls, neck pain, and neck hypomobility, leading to the following functional deficits: difficulty with walking, stairs, lifting items including groceries, balance, bending over, moving her neck, performing ADL's such as getting dressed, showering, bathing. Relevant past medical history and comorbidities include anxiety, aortic stenosis, depression, skin cancer, GERD, heart murmur, hyperchloremia, hypertension, neuropathy, skin cancer, type II diabetes mellitus, asthma, anxiety, left ventricular hypertrophy, diabetic neuropathy and per patient report unexplained weight loss (25-35lbs in past year) and a heart murmur. Patient denies history of stroke, seizure, lung problems, bowel or bladder changes, groin numbness or tingling.    Limitations Lifting;Walking;Standing;House hold activities;Other (comment)   walking, stairs, lifting items including groceries, balance, bending over, moving her neck, performing ADL's such as getting dressed, showering, bathing.   Patient Stated Goals "build up body strength and try to get imbalance under control"    Currently in Pain? Yes    Pain Score 1     Pain Location Neck    Pain Orientation Right;Left    Pain Onset More than a month ago               TREATMENT:  Manual therapy: to reduce pain and tissue tension, improve range of motion, neuromodulation, in order to promote improved ability to complete functional activities. SUPINE - STM to upper traps, cervical paraspinals, and suboccipitals including Suboccipital Release to suboccipital muscles at the back of the head improve muscular tension and head aches.    Neuromuscular Re-education: to improve, balance, postural strength, muscle activation patterns, and stabilization strength required for functional activities: - standing (self-selected stance) VORx1 exercise,  3x60 seconds  - standing roll on the wall, 2x5 reps of 10 feet each way, SBA for safety. Touchdown on wall for balance when needed. Keeping eyes open and stopping at each end of wall to regain balance for last  - ambulation with horizontal head turns, 2x30 feet, CGA (challenging) - ambulation 4x30 feet while looking for letters on sticky notes throughout gym and calling them out.  - VORx1 while walking progressively smaller letter and busy background. ~ 10x30 feet.  - Standing  airex pad, NBOS with eyes closed, 2x1 min. (Touch down UE support and SBA).                     Pt required multimodal cuing for proper technique and to facilitate improved neuromuscular control, strength, range of motion, and functional ability resulting in improved performance and form.    PT Education - 03/16/21 1145     Education Details form/technique with exercise. answered questions within scope about supraventricular tachycardia, occipital neuralgia.    Person(s) Educated Patient    Methods Explanation;Demonstration;Tactile cues;Verbal cues    Comprehension Verbalized  understanding;Returned demonstration;Verbal cues required;Tactile cues required;Need further instruction              PT Short Term Goals - 02/17/21 1619       PT SHORT TERM GOAL #1   Title Patient will be independent with inital home exercise program to improve strength/mobility/balance and functional independence with ADLs.    Baseline HEP to be provided at 2nd visit    Time 3    Period Weeks    Status Achieved    Target Date 02/03/21               PT Long Term Goals - 03/03/21 1901       PT LONG TERM GOAL #1   Title Patient will be independent with long term home exercise program to improve strength/mobility/balance and functional independence with ADLs.    Baseline Patient reports HEP compliance (03/03/2021).    Time 12    Period Weeks    Status On-going   TARGET DATE FOR ALL LONG TERM GOALS: 04/06/2021     PT LONG  TERM GOAL #2   Title Improve cervical AROM rotation to equal or greater than 60 degrees each direction to improve ability to check blind spot when driving, veiw surroundings, and allow patient to complete valued activities with less difficulty.    Baseline 30 L, 40 R (01/12/2021); 65 L, 71 R. Patient reports pain with L rotation (03/03/2021)    Time 12    Period Weeks    Status Achieved      PT LONG TERM GOAL #3   Title Patient will score equal or greater than 25/30 on Functional Gait Assessment to demonstrate reduction in fall risk to low fall risk classification.    Baseline 11/30 (01/12/2021); 19/30 (03/03/2021)    Time 12    Period Weeks    Status Partially Met      PT LONG TERM GOAL #4   Title Pt will increase LE strength of by at least 1/2 MMT grade in order to demonstrate improvement in strength, function, and general mobility    Baseline BIL hip flexion 3+, knee extension 4+ (01/13/2021); BIL hip flexion 3+, BIL knee extension 5/5 (03/03/2021).    Time 12    Period Weeks    Status Partially Met      PT LONG TERM GOAL #5   Title Demonstrate improved FOTO score to 53 or greater to demonstrate improvement in overall condition and self-reported functional ability    Baseline 45 (01/10/2021); 48 (03/03/2021)    Time 12    Period Weeks    Status Partially Met                   Plan - 03/17/21 1304     Clinical Impression Statement Patient tolerated treatment well and was able to progress to significantly harder vestibular challenges. Continues to feel off balance and like her feet are not going where she wants. Also continues to have some neck pain that is very tender to palpation but is not bothering her as much throughout the day. Finds unstable surface very challenging to balance on. Patient would benefit from continued management of limiting condition by skilled physical therapist to address remaining impairments and functional limitations to work towards stated goals and return to  PLOF or maximal functional independence.    Personal Factors and Comorbidities Comorbidity 3+;Past/Current Experience;Time since onset of injury/illness/exacerbation    Comorbidities anxiety, aortic stenosis, depression, skin cancer, GERD, heart murmur, hyperchloremia, hypertension,  neuropathy, skin cancer, type II diabetes mellitus, asthma, anxiety, left ventricular hypertrophy, diabetic neuropathy and per patient report unexplained weight loss (25-35lbs in past year) and a heart murmur    Examination-Activity Limitations Bathing;Bend;Hygiene/Grooming;Dressing;Carry;Lift;Locomotion Level;Stairs;Squat;Stand;Transfers;Other   walking, stairs, lifting items including groceries, balance, bending over, moving her neck, performing ADL's such as getting dressed, showering, bathing.   Examination-Participation Restrictions Driving;Cleaning;Interpersonal Relationship;Yard Work;Community Activity;Laundry;Shop    Stability/Clinical Decision Making Unstable/Unpredictable    Rehab Potential Fair    PT Frequency 2x / week    PT Duration 12 weeks    PT Treatment/Interventions Cryotherapy;ADLs/Self Care Home Management;Moist Heat;Gait training;Stair training;Therapeutic activities;Functional mobility training;Therapeutic exercise;Balance training;Neuromuscular re-education;Patient/family education;Manual techniques;Passive range of motion;Vestibular;Dry needling;Biofeedback;Canalith Repostioning;DME Instruction    PT Next Visit Plan Progress vestibular, strength, and balance activities as tolerated.    PT Home Exercise Plan Medbridge Access Code: [email or web address]    Consulted and Agree with Plan of Care Patient             Patient will benefit from skilled therapeutic intervention in order to improve the following deficits and impairments:  Impaired perceived functional ability, Abnormal gait, Decreased activity tolerance, Decreased range of motion, Decreased strength, Dizziness, Hypomobility, Impaired sensation, Pain,  Decreased balance, Decreased mobility, Difficulty walking, Impaired UE functional use  Visit Diagnosis: Unsteadiness on feet  Repeated falls  Cervicalgia  Dizziness and giddiness  Muscle weakness (generalized)     Problem List Patient Active Problem List   Diagnosis Date Noted   Cervical myelopathy ([place]) 11/15/2020   LVH (left ventricular hypertrophy) 10/14/2020   COVID-19 05/19/2020   MRSA cellulitis 04/19/2018   Decreased libido 02/04/2018   Uncontrolled type 2 diabetes mellitus with diabetic neuropathy, without long-term current use of insulin 04/20/2017   H/O adenomatous polyp of colon 08/20/2015   Rupture of implant of right breast 05/18/2015   Anxiety disorder 04/26/2015   Allergic rhinitis 04/21/2015   Anemia 04/21/2015   Asthma 04/21/2015   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/21/2015   Chronic infection of sinus 04/21/2015   Depression 04/21/2015   GERD (gastroesophageal reflux disease) 04/21/2015   Headache 04/21/2015   Menopausal symptoms 04/21/2015   Obesity 04/21/2015   Palpitations 04/21/2015   Vitamin B12 deficiency 04/21/2015   Aortic stenosis 69/45/0388   Diastolic dysfunction 82/80/0349   Diabetes mellitus out of control 06/12/2006   Barrett esophagus 06/13/2003   Essential hypertension 06/12/1998   Genital herpes 06/12/1998   Hyperlipidemia, mixed 06/12/1998   Everlean Alstrom. Graylon Good, PT, DPT 03/17/21, 1:06 PM   [facility] [address], [place], [address] Phone: [phone]   Fax:  [phone]  Name: Katie Buckley MRN: [id number] Date of Birth: 06-Dec-1950

## 2021-03-16 NOTE — Telephone Encounter (Signed)
Pt states she is tolerating Mounjaro well.  Please send RX to [facility] [address]    Thanks,   -Mickel Baas

## 2021-03-16 NOTE — Telephone Encounter (Signed)
Patient was given samples of Mounjaro for diabetes at the end of August. Please see if she is having any trouble with this medication. If not we can send in prescription to go up to the 5mg  injections.

## 2021-03-17 ENCOUNTER — Other Ambulatory Visit: Payer: Self-pay | Admitting: Family Medicine

## 2021-03-17 ENCOUNTER — Encounter: Payer: Self-pay | Admitting: Physical Therapy

## 2021-03-17 ENCOUNTER — Ambulatory Visit: Payer: PPO | Admitting: Physical Therapy

## 2021-03-17 VITALS — BP 160/59 | HR 69

## 2021-03-17 DIAGNOSIS — R296 Repeated falls: Secondary | ICD-10-CM

## 2021-03-17 DIAGNOSIS — R2681 Unsteadiness on feet: Secondary | ICD-10-CM | POA: Diagnosis not present

## 2021-03-17 DIAGNOSIS — M542 Cervicalgia: Secondary | ICD-10-CM

## 2021-03-17 DIAGNOSIS — M6281 Muscle weakness (generalized): Secondary | ICD-10-CM

## 2021-03-17 DIAGNOSIS — R42 Dizziness and giddiness: Secondary | ICD-10-CM

## 2021-03-17 MED ORDER — TIRZEPATIDE 5 MG/0.5ML ~~LOC~~ SOAJ: 5.0000 mg | SUBCUTANEOUS | 0 refills | Status: DC

## 2021-03-17 NOTE — Therapy (Signed)
[facility] [address], [place], [address] Phone: [phone]   Fax:  [phone]  Physical Therapy Treatment  Patient Details  Name: Katie Buckley MRN: [id number] Date of Birth: 1950/10/03 Referring Provider (PT): Loleta Dicker, [place]   Encounter Date: 03/17/2021   PT End of Session - 03/17/21 1624     Visit Number 12    Number of Visits 24    Date for PT Re-Evaluation 04/06/21    Authorization Type [employer]: (Reporting period from 03/03/2021)    Progress Note Due on Visit 20    PT Start Time 1605    PT Stop Time 1645    PT Time Calculation (min) 40 min    Equipment Utilized During Treatment Gait belt    Activity Tolerance Patient tolerated treatment well    Behavior During Therapy --   lethargic and sleepy            Past Medical History:  Diagnosis Date   Anxiety    Aortic stenosis, mild    a.) mean gradient 18.7 mmHg on 10/2020 TTE   Depression    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   T2DM (type 2 diabetes mellitus) ([place])     Past Surgical History:  Procedure Laterality Date   ANTERIOR CERVICAL DECOMP/DISCECTOMY FUSION N/A 11/15/2020   Procedure: ANTERIOR CERVICAL DECOMPRESSION/DISCECTOMY FUSION 1 LEVEL C3/4;  Surgeon: Deetta Perla, MD;  Location: [facility]S;  Service: Neurosurgery;  Laterality: N/A;   AUGMENTATION MAMMAPLASTY Bilateral    breast implants   Bone Spur  2007   foot   CARDIAC CATHETERIZATION     Carpal Tunnel Symdrome  2008   as repeated in 2009   CATARACT EXTRACTION W/PHACO Left 01/16/2019   Procedure:  CATARACT EXTRACTION PHACO AND INTRAOCULAR LENS PLACEMENT ([employer]) LEFT DIABETES VISION BLUE;  Surgeon: Marchia Meiers, MD;  Location: [facility];  Service: Ophthalmology;  Laterality: Left;  [place]  00:47 CDE 6.62 Fluid pack lot # [id number]   CATARACT EXTRACTION W/PHACO Right 02/06/2019   Procedure: CATARACT EXTRACTION PHACO AND INTRAOCULAR LENS PLACEMENT (IOC);  Surgeon: Marchia Meiers, MD;  Location: [facility];  Service: Ophthalmology;  Laterality: Right;  [place] 00:54.6 CDE 6.52 FLUID PACK LOT # [id number]   CHOLECYSTECTOMY  2007   COLONOSCOPY WITH PROPOFOL N/A 11/17/2015   Procedure: COLONOSCOPY WITH PROPOFOL;  Surgeon: Manya Silvas, MD;  Location: [facility];  Service: Endoscopy;  Laterality: N/A;   ESOPHAGOGASTRODUODENOSCOPY (EGD) WITH PROPOFOL N/A 11/17/2015   Procedure: ESOPHAGOGASTRODUODENOSCOPY (EGD) WITH PROPOFOL;  Surgeon: Manya Silvas, MD;  Location: [facility] ENDOSCOPY;  Service: Endoscopy;  Laterality: N/A;   EYE SURGERY Bilateral    cataract extraction   FOOT SURGERY     MANDIBLE SURGERY     NASAL SINUS SURGERY  03/2013   Multile surgery Dr. Carlis Abbott. 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 Tamala Julian   TONSILLECTOMY  1960      Subjective Assessment - 03/17/21 1607     Subjective  Patient reports 1/10 pain in the right cervical spine. States manual therapy helps this but she still feels it. Is here with her daughter Katie Buckley and they both state she feels a bit disoriented today. Patient thinks it is because of all of the things going on in her personal life. She states she feels a bit unsteady and a bit disoriented in her thoughts. She did not sleep well last night.    Pertinent History Patient is a 70 year old female who presents to outpatient physical therapy with a referral for s/p cervical spinal fusion (ANTERIOR CERVICAL DECOMPRESSION/DISCECTOMY FUSION 1 LEVEL C3/4, 11/15/2020), cervicalgia, multifactoral causes for imbalance, cervical myelopathy. This  patient's chief complaints consist of imbalance, history of falls, neck pain, and neck hypomobility, leading to the following functional deficits: difficulty with walking, stairs, lifting items including groceries, balance, bending over, moving her neck, performing ADL's such as getting dressed, showering, bathing. Relevant past medical history and comorbidities include anxiety, aortic stenosis, depression, skin cancer, GERD, heart murmur, hyperchloremia, hypertension, neuropathy, skin cancer, type II diabetes mellitus, asthma, anxiety, left ventricular hypertrophy, diabetic neuropathy and per patient report unexplained weight loss (25-35lbs in past year) and a heart murmur. Patient denies history of stroke, seizure, lung problems, bowel or bladder changes, groin numbness or tingling.    Limitations Lifting;Walking;Standing;House hold activities;Other (comment)   walking, stairs, lifting items including groceries, balance, bending over, moving her neck, performing ADL's such as getting dressed, showering, bathing.   Patient Stated Goals "build up body strength and try to get imbalance under control"    Currently in Pain? Yes    Pain Score 1     Pain Location Neck    Pain Onset More than a month ago             Vitals:   03/17/21 1607  BP: (!) 160/59  Pulse: 69  SpO2: 98%    TREATMENT:  Manual therapy: to reduce pain and tissue tension, improve range of motion, neuromodulation, in order to promote improved ability to complete functional activities. SUPINE - STM to upper traps, cervical paraspinals, and suboccipitals including Suboccipital Release to suboccipital muscles at the back of the head improve muscular tension and head aches.    Neuromuscular Re-education: to improve, balance, postural strength, muscle activation patterns, and stabilization strength required for functional activities: - standing (self-selected stance) VORx1 exercise, 2x60 seconds firm surface (Discontinued due to  sleepiness) - standing roll on the wall, 1x3 reps of 10 feet each way, SBA for safety. Touchdown on wall for balance when needed. Keeping eyes open and stopping at each end of wall to regain balance (required cuing to keep eyes open) - vitals check due to sleepiness (see above) - Standing  airex pad: NBOS head rotation looking and identifying object to each side, 1x20 each way. Min A at times to prevent falls backwards, touch down support as needed.  NBOS head rotation as quickly as possible, 1x20  - ambulation 4x30 feet while looking for letters on sticky notes throughout gym and calling them out.                     Pt required multimodal cuing for proper technique and to facilitate improved neuromuscular control, strength, range of motion, and functional ability resulting in improved performance. Patient required CGA - min A for safety.    PT Education - 03/17/21 1919     Education Details form/technique with exercise, reccomendation to check blood glucose when she gets home,  for daughter to drive due to sleepiness    Person(s) Educated Patient;Child(ren)    Methods Explanation;Demonstration;Tactile cues;Verbal cues    Comprehension Verbalized understanding;Returned demonstration;Verbal cues required;Tactile cues required;Need further instruction              PT Short Term Goals - 02/17/21 1619       PT SHORT TERM GOAL #1   Title Patient will be independent with inital home exercise program to improve strength/mobility/balance and functional independence with ADLs.    Baseline HEP to be provided at 2nd visit    Time 3    Period Weeks    Status Achieved    Target Date 02/03/21               PT Long Term Goals - 03/03/21 1901       PT LONG TERM GOAL #1   Title Patient will be independent with long term home exercise program to improve strength/mobility/balance and functional independence with ADLs.    Baseline Patient reports HEP compliance (03/03/2021).    Time 12     Period Weeks    Status On-going   TARGET DATE FOR ALL LONG TERM GOALS: 04/06/2021     PT LONG TERM GOAL #2   Title Improve cervical AROM rotation to equal or greater than 60 degrees each direction to improve ability to check blind spot when driving, veiw surroundings, and allow patient to complete valued activities with less difficulty.    Baseline 30 L, 40 R (01/12/2021); 65 L, 71 R. Patient reports pain with L rotation (03/03/2021)    Time 12    Period Weeks    Status Achieved      PT LONG TERM GOAL #3   Title Patient will score equal or greater than 25/30 on Functional Gait Assessment to demonstrate reduction in fall risk to low fall risk classification.    Baseline 11/30 (01/12/2021); 19/30 (03/03/2021)    Time 12    Period Weeks    Status Partially Met      PT LONG TERM GOAL #4   Title Pt will increase LE strength of by at least 1/2 MMT grade in order to demonstrate improvement in strength, function, and general mobility    Baseline BIL hip flexion 3+, knee extension 4+ (01/13/2021); BIL hip flexion 3+, BIL knee extension 5/5 (03/03/2021).    Time 12    Period Weeks    Status Partially Met      PT LONG TERM GOAL #5   Title Demonstrate improved FOTO score to 53 or greater to demonstrate improvement in overall condition and self-reported functional ability    Baseline 45 (01/10/2021); 48 (03/03/2021)    Time 12    Period Weeks    Status Partially Met                   Plan - 03/17/21 1927     Clinical Impression Statement [name] arrived with her daughter Katie Buckley after lunch and reported feeling more "disoriented" mentally and in her balance. Became apparent she was very sleepy (daughter reported some wide turns while driving there) after not sleeping well last night. Patient's vitals were checked when she couldn't seem to keep her eyes open with initial exercises but were not found to explain sleepiness. Patient repeatedly requested to continue session and was able to improve her  alertness after being cued to see and report objects out loud as part of her exercises. Patient advised to check blood glucose when she  gets home and for daughter to drive. Patient's lack of sleep and strong emotional burdon today appear to be affecting her balance and alertness. Patient ambulated out of the clinic with daughter and was answering questions appropriately, just seemed very tired. Patient would benefit from continued management of limiting condition by skilled physical therapist to address remaining impairments and functional limitations to work towards stated goals and return to PLOF or maximal functional independence.    Personal Factors and Comorbidities Comorbidity 3+;Past/Current Experience;Time since onset of injury/illness/exacerbation    Comorbidities anxiety, aortic stenosis, depression, skin cancer, GERD, heart murmur, hyperchloremia, hypertension, neuropathy, skin cancer, type II diabetes mellitus, asthma, anxiety, left ventricular hypertrophy, diabetic neuropathy and per patient report unexplained weight loss (25-35lbs in past year) and a heart murmur    Examination-Activity Limitations Bathing;Bend;Hygiene/Grooming;Dressing;Carry;Lift;Locomotion Level;Stairs;Squat;Stand;Transfers;Other   walking, stairs, lifting items including groceries, balance, bending over, moving her neck, performing ADL's such as getting dressed, showering, bathing.   Examination-Participation Restrictions Driving;Cleaning;Interpersonal Relationship;Yard Work;Community Activity;Laundry;Shop    Stability/Clinical Decision Making Unstable/Unpredictable    Rehab Potential Fair    PT Frequency 2x / week    PT Duration 12 weeks    PT Treatment/Interventions Cryotherapy;ADLs/Self Care Home Management;Moist Heat;Gait training;Stair training;Therapeutic activities;Functional mobility training;Therapeutic exercise;Balance training;Neuromuscular re-education;Patient/family education;Manual techniques;Passive range of  motion;Vestibular;Dry needling;Biofeedback;Canalith Repostioning;DME Instruction    PT Next Visit Plan Progress vestibular, strength, and balance activities as tolerated.    PT Home Exercise Plan Medbridge Access Code: [email or web address]    Consulted and Agree with Plan of Care Patient             Patient will benefit from skilled therapeutic intervention in order to improve the following deficits and impairments:  Impaired perceived functional ability, Abnormal gait, Decreased activity tolerance, Decreased range of motion, Decreased strength, Dizziness, Hypomobility, Impaired sensation, Pain, Decreased balance, Decreased mobility, Difficulty walking, Impaired UE functional use  Visit Diagnosis: Unsteadiness on feet  Repeated falls  Cervicalgia  Dizziness and giddiness  Muscle weakness (generalized)     Problem List Patient Active Problem List   Diagnosis Date Noted   Cervical myelopathy ([place]) 11/15/2020   LVH (left ventricular hypertrophy) 10/14/2020   COVID-19 05/19/2020   MRSA cellulitis 04/19/2018   Decreased libido 02/04/2018   Uncontrolled type 2 diabetes mellitus with diabetic neuropathy, without long-term current use of insulin 04/20/2017   H/O adenomatous polyp of colon 08/20/2015   Rupture of implant of right breast 05/18/2015   Anxiety disorder 04/26/2015   Allergic rhinitis 04/21/2015   Anemia 04/21/2015   Asthma 04/21/2015   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/21/2015   Chronic infection of sinus 04/21/2015   Depression 04/21/2015   GERD (gastroesophageal reflux disease) 04/21/2015   Headache 04/21/2015   Menopausal symptoms 04/21/2015   Obesity 04/21/2015   Palpitations 04/21/2015   Vitamin B12 deficiency 04/21/2015   Aortic stenosis 00/86/7619   Diastolic dysfunction 50/93/2671   Diabetes mellitus out of control 06/12/2006   Barrett esophagus 06/13/2003   Essential hypertension 06/12/1998   Genital herpes 06/12/1998   Hyperlipidemia, mixed 06/12/1998    Everlean Alstrom.  Graylon Good, PT, DPT 03/17/21, 7:30 PM   [facility] [address] [place], [address] Phone: [phone]   Fax:  [phone]  Name: Katie Buckley MRN: [id number] Date of Birth: 02-17-1951

## 2021-03-17 NOTE — Telephone Encounter (Signed)
Requested medication (s) are due for refill today:   Prescribed Prescribed by Dr. Caryn Section this morning  Future visit scheduled:   No   Last ordered: Today.   Returned because pharmacy needs a PA.  Med not covered by insur.   Requested Prescriptions  Pending Prescriptions Disp Refills   MOUNJARO 5 MG/0.5ML Pen [Pharmacy Med Name: MOUNJARO 5 MG/0.5 ML PEN]  0    Sig: INJECT 5 MG INTO THE SKIN ONCE A WEEK.     There is no refill protocol information for this order

## 2021-03-22 ENCOUNTER — Telehealth: Payer: Self-pay | Admitting: Family Medicine

## 2021-03-22 ENCOUNTER — Ambulatory Visit: Payer: PPO | Admitting: Physical Therapy

## 2021-03-22 NOTE — Telephone Encounter (Signed)
[id number] [address], [place] [address]  Phone: [phone] Fax: [phone]  Hours: Not open 24 hours

## 2021-03-22 NOTE — Telephone Encounter (Signed)
Pt called stating that she is sick. She states that she has a stuffy head, sneezing, congestion, and headache. Pt is requesting to have antibiotic sent in since no available appts until 03/25/21. Please advise.

## 2021-03-23 ENCOUNTER — Ambulatory Visit: Payer: Self-pay

## 2021-03-23 DIAGNOSIS — D045 Carcinoma in situ of skin of trunk: Secondary | ICD-10-CM | POA: Diagnosis not present

## 2021-03-23 NOTE — Telephone Encounter (Signed)
Patient called and advised she will need an appointment to receive an antibiotic for sinus congestion, patient verbalized understanding. She says the sinus congestion started about a week ago with pressure around her eyes, pain is a 4-5, greenish colored nasal drainage and greenish colored phlegm when she coughs. She says no fever, but she has chills. She reports her left ear hurts a little, she had a sore throat but not at this time, and nausea due to the drainage. Patient already has previously scheduled OV for Friday 03/25/21 at 1040 with Dr. Caryn Section. I spoke to [place], [facility] about that appointment and asked if the patient could be seen for her sinus issues as well as the reason for the appointment, she says yes that will be discussed and if the patient has a fever to call back and let us know. Patient advised of the above, care advice given, patient verbalized understanding.   Reason for Disposition  [1] Sinus congestion (pressure, fullness) AND [2] present > 10 days  Answer Assessment - Initial Assessment Questions 1. LOCATION: "Where does it hurt?"      Pressure around eyes 2. ONSET: "When did the sinus pain start?"  (e.g., hours, days)      Probably a week 3. SEVERITY: "How bad is the pain?"   (Scale 1-10; mild, moderate or severe)   - MILD (1-3): doesn't interfere with normal activities    - MODERATE (4-7): interferes with normal activities (e.g., work or school) or awakens from sleep   - SEVERE (8-10): excruciating pain and patient unable to do any normal activities        4-5 4. RECURRENT SYMPTOM: "Have you ever had sinus problems before?" If Yes, ask: "When was the last time?" and "What happened that time?"      Yes chronic sinus problems 5. NASAL CONGESTION: "Is the nose blocked?" If Yes, ask: "Can you open it or must you breathe through your mouth?"     Yes it's blocked 6. NASAL DISCHARGE: "Do you have discharge from your nose?" If so ask, "What color?"     Greenish 7. FEVER: "Do you  have a fever?" If Yes, ask: "What is it, how was it measured, and when did it start?"      No, but chills 8. OTHER SYMPTOMS: "Do you have any other symptoms?" (e.g., sore throat, cough, earache, difficulty breathing)     A little cough with greenish phlegm, left ear hurts a little, nausea upset from drainage, sore throat some (better now) 9. PREGNANCY: "Is there any chance you are pregnant?" "When was your last menstrual period?"     No  Protocols used: Sinus Pain or Congestion-A-AH

## 2021-03-23 NOTE — Telephone Encounter (Signed)
Patient called, left VM to return the call to the office to discuss symptoms with a nurse.     Minette Headland, CMA 1 hour ago (9:30 AM)   Lmtcb , when patient returns call please have her triaged by [facility] nurse. We can not provide antibiotic to patient wthout her being seen rather that is in office or virtual. Dr. Caryn Section does have availability 03/25/21 for a virtual appt. Please have patient schedule. Jerrye Beavers, [place] 21 hours ago (1:38 PM)   OW Pt called stating that she is sick. She states that she has a stuffy head, sneezing, congestion, and headache. Pt is requesting to have antibiotic sent in since no available appts until 03/25/21. Please advise.

## 2021-03-23 NOTE — Telephone Encounter (Signed)
Lmtcb , when patient returns call please have her triaged by [facility] nurse. We can not provide antibiotic to patient wthout her being seen rather that is in office or virtual. Dr. Caryn Section does have availability 03/25/21 for a virtual appt. Please have patient schedule. KW

## 2021-03-23 NOTE — Telephone Encounter (Signed)
Patient called, left VM to return the call to speak to a [place] about her symptoms. Triage encounter started, will call patient and document in that encounter.

## 2021-03-24 ENCOUNTER — Ambulatory Visit: Payer: PPO | Admitting: Physical Therapy

## 2021-03-24 ENCOUNTER — Encounter: Payer: Self-pay | Admitting: Physical Therapy

## 2021-03-24 VITALS — BP 168/70 | HR 106

## 2021-03-24 DIAGNOSIS — M6281 Muscle weakness (generalized): Secondary | ICD-10-CM

## 2021-03-24 DIAGNOSIS — R2681 Unsteadiness on feet: Secondary | ICD-10-CM | POA: Diagnosis not present

## 2021-03-24 DIAGNOSIS — R42 Dizziness and giddiness: Secondary | ICD-10-CM

## 2021-03-24 DIAGNOSIS — R296 Repeated falls: Secondary | ICD-10-CM

## 2021-03-24 DIAGNOSIS — M542 Cervicalgia: Secondary | ICD-10-CM

## 2021-03-24 NOTE — Therapy (Signed)
[facility] [address], [place], [address] Phone: [phone]   Fax:  [phone]  Physical Therapy Treatment  Patient Details  Name: Katie Buckley MRN: [id number] Date of Birth: 07-02-1950 Referring Provider (PT): Loleta Dicker, [place]   Encounter Date: 03/24/2021   PT End of Session - 03/24/21 1726     Visit Number 13    Number of Visits 24    Date for PT Re-Evaluation 04/06/21    Authorization Type [employer]: (Reporting period from 03/03/2021)    Progress Note Due on Visit 20    PT Start Time 1607    PT Stop Time 1647    PT Time Calculation (min) 40 min    Equipment Utilized During Treatment Gait belt    Activity Tolerance Patient tolerated treatment well    Behavior During Therapy 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   T2DM (type 2 diabetes mellitus) ([place])     Past Surgical History:  Procedure Laterality Date   ANTERIOR CERVICAL DECOMP/DISCECTOMY FUSION N/A 11/15/2020   Procedure: ANTERIOR CERVICAL DECOMPRESSION/DISCECTOMY FUSION 1 LEVEL C3/4;  Surgeon: Deetta Perla, MD;  Location: [facility] ORS;  Service: Neurosurgery;  Laterality: N/A;   AUGMENTATION MAMMAPLASTY Bilateral    breast implants   Bone Spur  2007   foot   CARDIAC CATHETERIZATION     Carpal Tunnel Symdrome  2008   as repeated in 2009   CATARACT EXTRACTION W/PHACO Left 01/16/2019    Procedure: CATARACT EXTRACTION PHACO AND INTRAOCULAR LENS PLACEMENT ([place]) LEFT DIABETES VISION BLUE;  Surgeon: Marchia Meiers, MD;  Location: [facility] ORS;  Service: Ophthalmology;  Laterality: Left;  [place]  00:47 CDE 6.62 Fluid pack lot # [id number]   CATARACT EXTRACTION W/PHACO Right 02/06/2019   Procedure: CATARACT EXTRACTION PHACO AND INTRAOCULAR LENS PLACEMENT (IOC);  Surgeon: Marchia Meiers, MD;  Location: [facility] ORS;  Service: Ophthalmology;  Laterality: Right;  [place] 00:54.6 CDE 6.52 FLUID PACK LOT # [id number]   CHOLECYSTECTOMY  2007   COLONOSCOPY WITH PROPOFOL N/A 11/17/2015   Procedure: COLONOSCOPY WITH PROPOFOL;  Surgeon: Manya Silvas, MD;  Location[facility] ENDOSCOPY;  Service: Endoscopy;  Laterality: N/A;   ESOPHAGOGASTRODUODENOSCOPY (EGD) WITH PROPOFOL N/A 11/17/2015   Procedure: ESOPHAGOGASTRODUODENOSCOPY (EGD) WITH PROPOFOL;  Surgeon: Manya Silvas, MD;  Location: [facility] ENDOSCOPY;  Service: Endoscopy;  Laterality: N/A;   EYE SURGERY Bilateral    cataract extraction   FOOT SURGERY     MANDIBLE SURGERY     NASAL SINUS SURGERY  03/2013   Multile surgery Dr. Carlis Abbott. DX eosinophilic sinusitis [phone]   Release of Trigger Finger Right 10/2010   Dr. Tamala Julian   TONSILLECTOMY  1960    Vitals:   03/24/21 1609  BP: (!) 168/70  Pulse: (!) 106  SpO2: 97%     Subjective Assessment - 03/24/21 1609     Subjective Patient reports she is feeling tired and disoriented today. She states she has a cold and is going to the doctor tomorrow but has not had a COVID 19 test. States she has congestion. She went to the dentist yesterday and got a skin cancer spot removed. She states she is sleeping better but it is hard to have her daughter living with her and her husband in their 1 bedroom apartment. States her daughter has been a help but they are used to living alone. States she is sorry she missed the other day. She just felt "bad" and stayed home. She states she does not remember her last PT appointment or how she  felt afterwards. Reports current pain of 2/10 in the mid upper back. Almost fell once when bending over since last PT session but did not fall. She felt like she was going to fall forward but she managed to catch herself. Will see Dr. Caryn Section her PCP tomorrow. Once patient was told what was happening at last PT session, she states the symptoms are familiar to her and that is one of the problems she is struggling with (including not being able to keep her eyes open).    Pertinent History Patient is a 70 year old female who presents to outpatient physical therapy with a referral for s/p cervical spinal fusion (ANTERIOR CERVICAL DECOMPRESSION/DISCECTOMY FUSION 1 LEVEL C3/4, 11/15/2020), cervicalgia, multifactoral causes for imbalance, cervical myelopathy. This patient's chief complaints consist of imbalance, history of falls, neck pain, and neck hypomobility, leading to the following functional deficits: difficulty with walking, stairs, lifting items including groceries, balance, bending over, moving her neck, performing ADL's such as getting dressed, showering, bathing. Relevant past medical history and comorbidities include anxiety, aortic stenosis, depression, skin cancer, GERD, heart murmur, hyperchloremia, hypertension, neuropathy, skin cancer, type II diabetes mellitus, asthma, anxiety, left ventricular hypertrophy, diabetic neuropathy and per patient report unexplained weight loss (25-35lbs in past year) and a heart murmur. Patient denies history of stroke, seizure, lung problems, bowel or bladder changes, groin numbness or tingling.    Limitations Lifting;Walking;Standing;House hold activities;Other (comment)   walking, stairs, lifting items including groceries, balance, bending over, moving her neck, performing ADL's such as getting dressed, showering, bathing.   Patient Stated Goals "build up body strength and try to get imbalance under control"    Currently in Pain? Yes    Pain Score 2     Pain Location  Thoracic    Pain Onset More than a month ago            VITALS BP 168/70 mmHg, seated, left arm, manual adult cuff.  HR 106 bpm SpO2 97% States she has not taken her blood pressure medication today because she usually takes it after lunch and today she got a late start.   TREATMENT:  Manual therapy: to reduce pain and tissue tension, improve range of motion, neuromodulation, in order to promote improved ability to complete functional activities. SUPINE - STM to upper traps, cervical paraspinals, and suboccipitals including Suboccipital Release to suboccipital muscles at the back of the head improve muscular tension and head aches.  - gentle intermittent manual traction    Neuromuscular Re-education: to improve, balance, postural strength, muscle activation patterns, and stabilization strength required for functional activities: - vitals check for safety (see above).  - standing (self-selected stance) VORx1 exercise, 3x60 seconds firm surface  - standing roll on the wall,  1x5 reps of 2 rolls each way, SBA for safety. Touchdown on wall for balance when needed. Keeping eyes open and stopping at each end of wall to regain balance  - ambulation 6x30 feet with horizontal head turns and CGA for safety (paused every 30 feet to allow gaze to stabilize again).  - Standing  airex pad: NBOS eyes closed, 3x60 seconds, increased sway with repeated failure to maintain balance.   Pt required multimodal cuing for proper technique and to facilitate improved neuromuscular control, strength, range of motion, and functional ability resulting in improved performance. Patient required CGA - min A for safety.     PT Education - 03/24/21 1626     Education Details form/technique with exercise. what happened at last visit.    Person(s) Educated Patient    Methods Explanation;Demonstration;Tactile cues;Verbal cues    Comprehension Verbalized understanding;Returned demonstration;Verbal cues required;Tactile  cues required;Need further instruction              PT Short Term Goals - 02/17/21 1619       PT SHORT TERM GOAL #1   Title Patient will be independent with inital home exercise program to improve strength/mobility/balance and functional independence with ADLs.    Baseline HEP to be provided at 2nd visit    Time 3    Period Weeks    Status Achieved    Target Date 02/03/21               PT Long Term Goals - 03/03/21 1901       PT LONG TERM GOAL #1   Title Patient will be independent with long term home exercise program to improve strength/mobility/balance and functional independence with ADLs.    Baseline Patient reports HEP compliance (03/03/2021).    Time 12    Period Weeks    Status On-going   TARGET DATE FOR ALL LONG TERM GOALS: 04/06/2021     PT LONG TERM GOAL #2   Title Improve cervical AROM rotation to equal or greater than 60 degrees each direction to improve ability to check blind spot when driving, veiw surroundings, and allow patient to complete valued activities with less difficulty.    Baseline 30 L, 40 R (01/12/2021); 65 L, 71 R. Patient reports pain with L rotation (03/03/2021)    Time 12    Period Weeks    Status Achieved      PT LONG TERM GOAL #3   Title Patient will score equal or greater than 25/30 on Functional Gait Assessment to demonstrate reduction in fall risk to low fall risk classification.    Baseline 11/30 (01/12/2021); 19/30 (03/03/2021)    Time 12    Period Weeks    Status Partially Met      PT LONG TERM GOAL #4   Title Pt will increase LE strength of by at least 1/2 MMT grade in order to demonstrate improvement in strength, function, and general mobility    Baseline BIL hip flexion 3+, knee extension 4+ (01/13/2021); BIL hip flexion 3+, BIL knee extension 5/5 (03/03/2021).    Time 12    Period Weeks    Status Partially Met      PT LONG TERM GOAL #5   Title Demonstrate improved FOTO score to 53 or greater to demonstrate improvement in  overall condition and self-reported functional ability    Baseline 45 (01/10/2021); 48 (03/03/2021)    Time 12    Period Weeks    Status Partially Met  Plan - 03/24/21 1724     Clinical Impression Statement Patient more alert today but continues to complain of feeling disoriented and fatigued. Had a lot of involuntary movements in the face and head this session. Patient heart rate elevated and BP elevated compared to usual for her but this is consistent with not taking meds yet today. Patient to follow up with PCP tomorrow. Patient did not remember her last PT visit at all and reported that not being able to keep her eyes open is a common problem for her. Patient repeatedly asked why she was having trouble with her gaze stabilization ("my vision") today which has been discussed several times at previous sessions. Patient also reported the room spinning after two revolutions of body rolls on the wall, which was a new symptom reported and will be monitored. Patient received by daughter who seemed to think she was not taking her injections for diabetes like prescribed and reported her blood glucose was in the 200s when they got home from her last PT visit. Overall, patient doing much better today than at last session but has worsened in her presentation over the last two sessions. Patient will benefit from further medical evaluation with her PCP and cardiologist as planned. Patient would benefit from continued management of limiting condition by skilled physical therapist to address remaining impairments and functional limitations to work towards stated goals and return to PLOF or maximal functional independence.    Personal Factors and Comorbidities Comorbidity 3+;Past/Current Experience;Time since onset of injury/illness/exacerbation    Comorbidities anxiety, aortic stenosis, depression, skin cancer, GERD, heart murmur, hyperchloremia, hypertension, neuropathy, skin cancer, type II  diabetes mellitus, asthma, anxiety, left ventricular hypertrophy, diabetic neuropathy and per patient report unexplained weight loss (25-35lbs in past year) and a heart murmur    Examination-Activity Limitations Bathing;Bend;Hygiene/Grooming;Dressing;Carry;Lift;Locomotion Level;Stairs;Squat;Stand;Transfers;Other   walking, stairs, lifting items including groceries, balance, bending over, moving her neck, performing ADL's such as getting dressed, showering, bathing.   Examination-Participation Restrictions Driving;Cleaning;Interpersonal Relationship;Yard Work;Community Activity;Laundry;Shop    Stability/Clinical Decision Making Unstable/Unpredictable    Rehab Potential Fair    PT Frequency 2x / week    PT Duration 12 weeks    PT Treatment/Interventions Cryotherapy;ADLs/Self Care Home Management;Moist Heat;Gait training;Stair training;Therapeutic activities;Functional mobility training;Therapeutic exercise;Balance training;Neuromuscular re-education;Patient/family education;Manual techniques;Passive range of motion;Vestibular;Dry needling;Biofeedback;Canalith Repostioning;DME Instruction    PT Next Visit Plan Progress vestibular, strength, and balance activities as tolerated.    PT Home Exercise Plan Medbridge Access Code: [email or web address]    Consulted and Agree with Plan of Care Patient             Patient will benefit from skilled therapeutic intervention in order to improve the following deficits and impairments:  Impaired perceived functional ability, Abnormal gait, Decreased activity tolerance, Decreased range of motion, Decreased strength, Dizziness, Hypomobility, Impaired sensation, Pain, Decreased balance, Decreased mobility, Difficulty walking, Impaired UE functional use  Visit Diagnosis: Unsteadiness on feet  Repeated falls  Cervicalgia  Dizziness and giddiness  Muscle weakness (generalized)     Problem List Patient Active Problem List   Diagnosis Date Noted   Cervical myelopathy  (HCC) 11/15/2020   LVH (left ventricular hypertrophy) 10/14/2020   COVID-19 05/19/2020   MRSA cellulitis 04/19/2018   Decreased libido 02/04/2018   Uncontrolled type 2 diabetes mellitus with diabetic neuropathy, without long-term current use of insulin 04/20/2017   H/O adenomatous polyp of colon 08/20/2015   Rupture of implant of right breast 05/18/2015   Anxiety disorder 04/26/2015   Allergic rhinitis 04/21/2015  Anemia 04/21/2015   Asthma 04/21/2015   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/21/2015   Chronic infection of sinus 04/21/2015   Depression 04/21/2015   GERD (gastroesophageal reflux disease) 04/21/2015   Headache 04/21/2015   Menopausal symptoms 04/21/2015   Obesity 04/21/2015   Palpitations 04/21/2015   Vitamin B12 deficiency 04/21/2015   Aortic stenosis 27/78/2423   Diastolic dysfunction 53/61/4431   Diabetes mellitus out of control 06/12/2006   Barrett esophagus 06/13/2003   Essential hypertension 06/12/1998   Genital herpes 06/12/1998   Hyperlipidemia, mixed 06/12/1998    Everlean Alstrom. Graylon Good, PT, DPT 03/24/21, 5:27 PM   [facility] [address] [place], [address] Phone: [phone]   Fax:  [phone]  Name: Katie Buckley MRN: [id number] Date of Birth: [birth year]

## 2021-03-25 ENCOUNTER — Ambulatory Visit (INDEPENDENT_AMBULATORY_CARE_PROVIDER_SITE_OTHER): Payer: PPO | Admitting: Family Medicine

## 2021-03-25 ENCOUNTER — Other Ambulatory Visit: Payer: Self-pay

## 2021-03-25 ENCOUNTER — Encounter: Payer: Self-pay | Admitting: Family Medicine

## 2021-03-25 VITALS — BP 150/46 | HR 68 | Temp 98.5°F | Resp 18 | Wt 140.0 lb

## 2021-03-25 DIAGNOSIS — E1140 Type 2 diabetes mellitus with diabetic neuropathy, unspecified: Secondary | ICD-10-CM | POA: Diagnosis not present

## 2021-03-25 DIAGNOSIS — J019 Acute sinusitis, unspecified: Secondary | ICD-10-CM

## 2021-03-25 LAB — POCT GLYCOSYLATED HEMOGLOBIN (HGB A1C)
Est. average glucose Bld gHb Est-mCnc: 217
Hemoglobin A1C: 9.2 % — AB (ref 4.0–5.6)

## 2021-03-25 MED ORDER — AMOXICILLIN 500 MG PO CAPS: 1000.0000 mg | ORAL_CAPSULE | Freq: Two times a day (BID) | ORAL | 0 refills | Status: AC

## 2021-03-25 NOTE — Progress Notes (Signed)
Established patient visit   Patient: Katie Buckley   DOB: 10-02-1950   69 y.o. Female  MRN: [id number] Visit Date: 03/25/2021  Today's healthcare provider: Lelon Huh, MD   Chief Complaint  Patient presents with   Diabetes   Hypertension   Subjective    HPI  Diabetes Mellitus Type II, Follow-up  Lab Results  Component Value Date   HGBA1C 9.2 (A) 03/25/2021   HGBA1C 11.3 (A) 02/09/2021   HGBA1C 9.9 (A) 10/27/2020   Wt Readings from Last 3 Encounters:  03/25/21 140 lb (63.5 kg)  02/09/21 143 lb (64.9 kg)  11/15/20 142 lb (64.4 kg)   Last seen for diabetes on 02/09/2021.   Management since then includes trying samples of very low dose tirzepatide [facility]) 2.5 MG/0.5ML Pen; Inject 2.5 mg into the skin once a week.. She reports good compliance with treatment. She is not having side effects.  Symptoms: Yes fatigue No foot ulcerations  No appetite changes Yes nausea  No paresthesia of the feet  No polydipsia  No polyuria No visual disturbances   No vomiting     Home blood sugar records:  blood sugars are not checked  Episodes of hypoglycemia? No    Current insulin regiment: none Most Recent Eye Exam:2 months ago Current exercise: none Current diet habits: in general, an "unhealthy" diet  Pertinent Labs: Lab Results  Component Value Date   CHOL 236 (H) 02/09/2021   HDL 88 02/09/2021   LDLCALC 108 (H) 02/09/2021   TRIG 238 (H) 02/09/2021   CHOLHDL 2.7 02/09/2021   Lab Results  Component Value Date   NA 135 02/09/2021   K 4.9 02/09/2021   CREATININE 0.84 02/09/2021   EGFR 75 02/09/2021   GFRNONAA >60 11/15/2020   GLUCOSE 353 (H) 02/09/2021     ---------------------------------------------------------------------------------------------------   Hypertension, follow-up  BP Readings from Last 3 Encounters:  03/25/21 (!) 150/46  03/24/21 (!) 168/70  03/17/21 (!) 160/59   Wt Readings from Last 3 Encounters:  03/25/21 140 lb (63.5 kg)   02/09/21 143 lb (64.9 kg)  11/15/20 142 lb (64.4 kg)     She was last seen for hypertension on 02/09/2021.   BP at that visit was 171/54. Management since that visit includes no changes.  She reports good compliance with treatment. She is not having side effects.  She is following a Regular diet. She is not exercising. She does not smoke.  Use of agents associated with hypertension: none.   Outside blood pressures are 168/70. Symptoms: No chest pain No chest pressure  No palpitations No syncope  No dyspnea No orthopnea  No paroxysmal nocturnal dyspnea No lower extremity edema   Pertinent labs: Lab Results  Component Value Date   NA 135 02/09/2021   K 4.9 02/09/2021   CREATININE 0.84 02/09/2021   EGFR 75 02/09/2021   GFRNONAA >60 11/15/2020   GLUCOSE 353 (H) 02/09/2021     The 10-year ASCVD risk score (Arnett DK, et al., 2019) is: 26.2%   ---------------------------------------------------------------------------------------------------   Sinus problem: Patient complains of sinus congestion, stuffy head, sneezing, congestion, and headache. She says the sinus congestion started a little over a week ago with pressure around her eyes, pain is a 4-5, greenish colored nasal drainage and greenish colored phlegm when she coughs. She says no fever, but she has chills. She reports her left ear hurts a little, she had a sore throat but not at this time, and nausea due to the drainage.  Has tried using Mucinex and Tylenol with mild relief.     Medications: Outpatient Medications Prior to Visit  Medication Sig   azelastine (ASTELIN) 0.1 % nasal spray Place 1-2 sprays into both nostrils every 12 (twelve) hours as needed.   buPROPion (WELLBUTRIN SR) 100 MG 12 hr tablet Take 100 mg by mouth 2 (two) times daily.   butalbital-acetaminophen-caffeine (FIORICET) 50-325-40 MG tablet TAKE 1 TABLET BY MOUTH EVERY 6 HOURS AS NEEDED FOR HEADACHE   empagliflozin (JARDIANCE) 25 MG TABS tablet  Take 1 tablet (25 mg total) by mouth daily.   Esomeprazole Magnesium 20 MG TBEC Take 20 mg by mouth daily before breakfast.   ezetimibe (ZETIA) 10 MG tablet TAKE 1 TABLET BY MOUTH EVERYDAY AT BEDTIME (Patient taking differently: Take 10 mg by mouth at bedtime.)   fexofenadine (ALLEGRA) 180 MG tablet Take 180 mg by mouth daily as needed for allergies or rhinitis.   fluconazole (DIFLUCAN) 150 MG tablet Take one tablet on day you receive medication Take second tablet three days later Take third and last tablet three days later   gabapentin (NEURONTIN) 300 MG capsule TAKE 2 CAPSULES (600 MG TOTAL) BY MOUTH 2 (TWO) TIMES DAILY. MORNING & SUPPER   glipiZIDE (GLUCOTROL) 10 MG tablet TAKE 1 TABLET BY MOUTH EVERY DAY BEFORE BREAKFAST   hydrOXYzine (VISTARIL) 25 MG capsule Take 25 mg by mouth daily as needed for anxiety.   irbesartan (AVAPRO) 75 MG tablet TAKE 1 TABLET BY MOUTH EVERY DAY   Lancet Devices MISC    metFORMIN (GLUCOPHAGE-XR) 500 MG 24 hr tablet Take 2 tablets (1,000 mg total) by mouth daily. (Patient taking differently: Take 500 mg by mouth daily.)   methocarbamol (ROBAXIN) 500 MG tablet Take 1 tablet (500 mg total) by mouth every 6 (six) hours as needed for muscle spasms.   metoprolol-hydrochlorothiazide (LOPRESSOR HCT) 100-25 MG tablet Take 0.5 tablets by mouth daily.   montelukast (SINGULAIR) 10 MG tablet TAKE 1 TABLET BY MOUTH EVERY DAY (Patient taking differently: Take 10 mg by mouth daily as needed (allergies).)   Olopatadine HCl 0.7 % SOLN Apply 1 drop to eye daily as needed.   ondansetron (ZOFRAN ODT) 4 MG disintegrating tablet Take 1 tablet (4 mg total) by mouth every 8 (eight) hours as needed for nausea or vomiting.   ondansetron (ZOFRAN) 4 MG tablet TAKE 1 TABLET BY MOUTH EVERY 8 HOURS AS NEEDED   oxyCODONE (OXY IR/ROXICODONE) 5 MG immediate release tablet Take 1 tablet (5 mg total) by mouth every 6 (six) hours as needed for moderate pain ((score 4 to 6)).   rosuvastatin (CRESTOR) 20  MG tablet TAKE 1 TABLET BY MOUTH EVERY DAY IN THE EVENING   senna (SENOKOT) 8.6 MG TABS tablet Take 1 tablet (8.6 mg total) by mouth 2 (two) times daily.   sertraline (ZOLOFT) 100 MG tablet Take 200 mg by mouth daily.   tirzepatide [facility]) 5 MG/0.5ML Pen Inject 5 mg into the skin once a week.   traZODone (DESYREL) 50 MG tablet Take 50-100 mg by mouth at bedtime as needed for sleep.    valACYclovir (VALTREX) 1000 MG tablet Take 1 tablet daily for 5 days as needed   No facility-administered medications prior to visit.    Review of Systems  Constitutional:  Negative for appetite change, chills, fatigue and fever.  HENT:  Positive for congestion, postnasal drip, rhinorrhea, sinus pressure, sneezing and sore throat (resolved).   Respiratory:  Negative for chest tightness and shortness of breath.   Cardiovascular:  Negative for chest pain and palpitations.  Gastrointestinal:  Positive for nausea. Negative for abdominal pain and vomiting.  Neurological:  Negative for dizziness and weakness.      Objective    BP (!) 150/46 (BP Location: Left Arm, Patient Position: Sitting, Cuff Size: Normal)   Pulse 68   Temp 98.5 F (36.9 C) (Oral)   Resp 18   Wt 140 lb (63.5 kg)   SpO2 99% Comment: room air  BMI 27.34 kg/m  {Show previous vital signs (optional):23777}  Physical Exam  General Appearance:     Overweight female, alert, cooperative, in no acute distress  HENT:   Frontal and maxillary sinuses tender  Eyes:    PERRL, conjunctiva/corneas clear, EOM's intact       Lungs:     Clear to auscultation bilaterally, respirations unlabored  Heart:    Normal heart rate. Normal rhythm.  2/6 harsh, crescendo-decrescendo, systolic murmur at right upper sternal border   Neurologic:   Awake, alert, oriented x 3. No apparent focal neurological           defect.         Results for orders placed or performed in visit on 03/25/21  POCT HgB A1C  Result Value Ref Range   Hemoglobin A1C 9.2 (A) 4.0 -  5.6 %   Est. average glucose Bld gHb Est-mCnc 217     Assessment & Plan     1. Type 2 diabetes mellitus with diabetic neuropathy, without long-term current use of insulin (HCC) Uncontrolled, but improved. She did not tolerate Ozempic due to GI side effects. Have placed order for [facility] and waiting for PA to go through.   2. Acute sinusitis, recurrence not specified, unspecified location  - amoxicillin (AMOXIL) 500 MG capsule; Take 2 capsules (1,000 mg total) by mouth 2 (two) times daily for 7 days.  Dispense: 28 capsule; Refill: 0   Future Appointments  Date Time Provider [place]  03/31/2021  4:00 PM Nancy Nordmann, PT [facility] None  04/05/2021  8:30 AM [facility] PHARMACIST [facility]  04/05/2021  4:00 PM Nancy Nordmann, PT [facility] None  04/07/2021  4:00 PM Nancy Nordmann, PT [facility] None  05/30/2021 11:00 AM Caryn Section Kirstie Peri, MD [facility]         The entirety of the information documented in the History of Present Illness, Review of Systems and Physical Exam were personally obtained by me. Portions of this information were initially documented by the CMA and reviewed by me for thoroughness and accuracy.     Lelon Huh, MD  [facility] [phone] (phone) [phone] (fax)  [place]

## 2021-03-28 ENCOUNTER — Telehealth: Payer: Self-pay

## 2021-03-28 NOTE — Telephone Encounter (Signed)
I called pharmacy and pharmacist confirmed that they did receive the prescription sent on 03-17-2021. Pharmacist states that the medication is not covered and is not on the formulary. Pharmacist asked if we had a coupon for this medication.   Would you like [place] to call for PA? [phone] is the number the pharmacist gave to me, which is a number for patient's to call. Pharmacist says that I can try calling that number to find out if we can do a PA on this medication.

## 2021-03-28 NOTE — Telephone Encounter (Signed)
-----   Message from [name], MD sent at 03/28/2021  8:22 AM EDT ----- Regarding: Darcel Bayley prescription can you please check with [facility] [phone] regarding rx for Sheppard Plumber that was sent on 03-17-2021. Patient states hasn't got it yet. I haven't got a PA request on it either.

## 2021-03-28 NOTE — Patient Instructions (Signed)
.   Please review the attached list of medications and notify my office if there are any errors.   . Please bring all of your medications to every appointment so we can make sure that our medication list is the same as yours.   

## 2021-03-28 NOTE — Telephone Encounter (Signed)
I called number listed below and requested a PA form for medication. Awaiting form.

## 2021-03-28 NOTE — Telephone Encounter (Signed)
Yes please, can you call and see if there is a PA for the medication. We don't have any samples or coupons.

## 2021-03-29 ENCOUNTER — Encounter: Payer: Self-pay | Admitting: Physical Therapy

## 2021-03-29 ENCOUNTER — Ambulatory Visit: Payer: PPO | Admitting: Physical Therapy

## 2021-03-29 VITALS — BP 170/48 | HR 75

## 2021-03-29 DIAGNOSIS — R2681 Unsteadiness on feet: Secondary | ICD-10-CM | POA: Diagnosis not present

## 2021-03-29 DIAGNOSIS — R296 Repeated falls: Secondary | ICD-10-CM

## 2021-03-29 DIAGNOSIS — R42 Dizziness and giddiness: Secondary | ICD-10-CM

## 2021-03-29 DIAGNOSIS — M542 Cervicalgia: Secondary | ICD-10-CM

## 2021-03-29 DIAGNOSIS — M6281 Muscle weakness (generalized): Secondary | ICD-10-CM

## 2021-03-29 NOTE — Therapy (Signed)
[facility] [address], [place], [address] Phone: [phone]   Fax:  [phone]  Physical Therapy Treatment  Patient Details  Name: Katie Buckley MRN: [id number] Date of Birth: 10-19-1950 Referring Provider (PT): Katie Buckley, [place]   Encounter Date: 03/29/2021   PT End of Session - 03/29/21 1609     Visit Number 14    Number of Visits 24    Date for PT Re-Evaluation 04/06/21    Authorization Type [employer]: (Reporting period from 03/03/2021)    Progress Note Due on Visit 20    PT Start Time 1600    PT Stop Time 1640    PT Time Calculation (min) 40 min    Equipment Utilized During Treatment Gait belt    Activity Tolerance Patient tolerated treatment well    Behavior During Therapy [facility] for tasks assessed/performed             Past Medical History:  Diagnosis Date   Anxiety    Aortic stenosis, mild    a.) mean gradient 18.7 mmHg on 10/2020 TTE   Depression    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   T2DM (type 2 diabetes mellitus) ([place])     Past Surgical History:  Procedure Laterality Date   ANTERIOR CERVICAL DECOMP/DISCECTOMY FUSION N/A 11/15/2020   Procedure: ANTERIOR CERVICAL DECOMPRESSION/DISCECTOMY FUSION 1 LEVEL C3/4;  Surgeon: Katie Perla, MD;  Location: [facility] ORS;  Service: Neurosurgery;  Laterality: N/A;   AUGMENTATION MAMMAPLASTY Bilateral    breast implants   Bone Spur  2007   foot   CARDIAC CATHETERIZATION     Carpal Tunnel Symdrome  2008   as repeated in 2009   CATARACT EXTRACTION W/PHACO Left 01/16/2019    Procedure: CATARACT EXTRACTION PHACO AND INTRAOCULAR LENS PLACEMENT ([place]) LEFT DIABETES VISION BLUE;  Surgeon: Katie Meiers, MD;  Location: [facility] ORS;  Service: Ophthalmology;  Laterality: Left;  [place]  00:47 CDE 6.62 Fluid pack lot # [id number]   CATARACT EXTRACTION W/PHACO Right 02/06/2019   Procedure: CATARACT EXTRACTION PHACO AND INTRAOCULAR LENS PLACEMENT ([facility]);  Surgeon: Katie Meiers, MD;  Location: [facility] ORS;  Service: Ophthalmology;  Laterality: Right;  [place] 00:54.6 CDE 6.52 FLUID PACK LOT # [id number]   CHOLECYSTECTOMY  2007   COLONOSCOPY WITH PROPOFOL N/A 11/17/2015   Procedure: COLONOSCOPY WITH PROPOFOL;  Surgeon: Katie Silvas, MD;  Location: [facility] ENDOSCOPY;  Service: Endoscopy;  Laterality: N/A;   ESOPHAGOGASTRODUODENOSCOPY (EGD) WITH PROPOFOL N/A 11/17/2015   Procedure: ESOPHAGOGASTRODUODENOSCOPY (EGD) WITH PROPOFOL;  Surgeon: Katie Silvas, MD;  Location: [facility] ENDOSCOPY;  Service: Endoscopy;  Laterality: N/A;   EYE SURGERY Bilateral    cataract extraction   FOOT SURGERY     MANDIBLE SURGERY     NASAL SINUS SURGERY  03/2013   Multile surgery Katie Buckley. 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 Tamala Julian   TONSILLECTOMY  1960    Vitals:   03/29/21 1609  BP: (!) 170/48  Pulse: 75  SpO2: 98%     Subjective Assessment - 03/29/21 1604     Subjective [name] reports she is feeling okay today. State she rolled off of her bed this morning and rolled off of her bed onto her hands and knees and hit the right side of the head on the nightstand nob. States she did not lose conciousness. It is a bit tender but it is not throbbing. The skin did not break and she has not had any changes in vision or nausea from baseline. Her neck is a little stiff at her shoulders but not painful. She had no issues with her follow up with PCP this week. A1c went down from 11 to 9. Patient reports balance has been okay and she has not had any real issues.    Pertinent  History Patient is a 70 year old female who presents to outpatient physical therapy with a referral for s/p cervical spinal fusion (ANTERIOR CERVICAL DECOMPRESSION/DISCECTOMY FUSION 1 LEVEL C3/4, 11/15/2020), cervicalgia, multifactoral causes for imbalance, cervical myelopathy. This patient's chief complaints consist of imbalance, history of falls, neck pain, and neck hypomobility, leading to the following functional deficits: difficulty with walking, stairs, lifting items including groceries, balance, bending over, moving her neck, performing ADL's such as getting dressed, showering, bathing. Relevant past medical history and comorbidities include anxiety, aortic stenosis, depression, skin cancer, GERD, heart murmur, hyperchloremia, hypertension, neuropathy, skin cancer, type II diabetes mellitus, asthma, anxiety, left ventricular hypertrophy, diabetic neuropathy and per patient report unexplained weight loss (25-35lbs in past year) and a heart murmur. Patient denies history of stroke, seizure, lung problems, bowel or bladder changes, groin numbness or tingling.    Limitations Lifting;Walking;Standing;House hold activities;Other (comment)   walking, stairs, lifting items including groceries, balance, bending over, moving her neck, performing ADL's such as getting dressed, showering, bathing.   Patient Stated Goals "build up body strength and try to get imbalance under control"    Currently in Pain? No/denies    Pain Onset More than a month ago             OBJECTIVE  VITALS BP 170/48 mmHg, seated, left arm, automatic adult cuff.  BP 166/56 mmHg, seated, left arm, manual adult cuff. HR 75 bpm SpO2 98% Patient state she has not taken her BP medication yet today.    TREATMENT:    Neuromuscular Re-education: to improve, balance, postural strength, muscle activation patterns, and stabilization strength required for functional activities: - vitals check for safety (see above).  - standing  (self-selected narrow stance) VORx1 exercise, 3x60 seconds compliant surface (airex).  - standing roll on the wall, 1x5 reps of 2 rolls each way, SBA for safety. Touchdown on wall for balance when needed. Keeping eyes open and stopping at each end to focus on a letter across the hall.  - ambulation 6x30 feet with horizontal head turns and CGA for safety (paused every 30 feet to allow gaze to stabilize again). - ambulation 4x30 feet with VORx1 held by PT walking in front of patient.  - ambulation 6x30 feet with VORx1 focusing on smaller letter on visually busy backdrop held by  PT walking in front of patient.  - alternating double toe taps on cone with no UE support, SBA-CGA for safety, 2x10 each side.  - tandem walking on line, 4x10 feet with CGA for safety.    Pt required multimodal cuing for proper technique and to facilitate improved neuromuscular control, strength, range of motion, and functional ability resulting in improved performance. Patient required  CGA - min A for safety.    PT Education - 03/29/21 1614     Education Details form/technique with exercise    Person(s) Educated Patient    Methods Explanation;Demonstration;Tactile cues;Verbal cues    Comprehension Verbalized understanding;Returned demonstration;Verbal cues required;Tactile cues required;Need further instruction              PT Short Term Goals - 02/17/21 1619       PT SHORT TERM GOAL #1   Title Patient will be independent with inital home exercise program to improve strength/mobility/balance and functional independence with ADLs.    Baseline HEP to be provided at 2nd visit    Time 3    Period Weeks    Status Achieved    Target Date 02/03/21               PT Long Term Goals - 03/03/21 1901       PT LONG TERM GOAL #1   Title Patient will be independent with long term home exercise program to improve strength/mobility/balance and functional independence with ADLs.    Baseline Patient reports HEP  compliance (03/03/2021).    Time 12    Period Weeks    Status On-going   TARGET DATE FOR ALL LONG TERM GOALS: 04/06/2021     PT LONG TERM GOAL #2   Title Improve cervical AROM rotation to equal or greater than 60 degrees each direction to improve ability to check blind spot when driving, veiw surroundings, and allow patient to complete valued activities with less difficulty.    Baseline 30 L, 40 R (01/12/2021); 65 L, 71 R. Patient reports pain with L rotation (03/03/2021)    Time 12    Period Weeks    Status Achieved      PT LONG TERM GOAL #3   Title Patient will score equal or greater than 25/30 on Functional Gait Assessment to demonstrate reduction in fall risk to low fall risk classification.    Baseline 11/30 (01/12/2021); 19/30 (03/03/2021)    Time 12    Period Weeks    Status Partially Met      PT LONG TERM GOAL #4   Title Pt will increase LE strength of by at least 1/2 MMT grade in order to demonstrate improvement in strength, function, and general mobility    Baseline BIL hip flexion 3+, knee extension 4+ (01/13/2021); BIL hip flexion 3+, BIL knee extension 5/5 (03/03/2021).    Time 12    Period Weeks    Status Partially Met      PT LONG TERM GOAL #5   Title Demonstrate improved FOTO score to 53 or greater to demonstrate improvement in overall condition and self-reported functional ability    Baseline 45 (01/10/2021); 48 (03/03/2021)    Time 12    Period Weeks    Status Partially Met                   Plan - 03/29/21 1814     Clinical Impression Statement [name] continues to complain of tiredness but performed significantly better today during PT and was able to return to higher level balance and vestibular activities with minimal sign of confusion or being disoriented. Patient's blood pressure continues to be elevated and she again did not take her medication prior to PT appointment. Patient continues to demonstrate deficits in balance and vestibular function and has not  shown stability in her improvements. Patient would benefit from continued management of limiting condition by skilled  physical therapist to address remaining impairments and functional limitations to work towards stated goals and return to PLOF or maximal functional independence.    Personal Factors and Comorbidities Comorbidity 3+;Past/Current Experience;Time since onset of injury/illness/exacerbation    Comorbidities anxiety, aortic stenosis, depression, skin cancer, GERD, heart murmur, hyperchloremia, hypertension, neuropathy, skin cancer, type II diabetes mellitus, asthma, anxiety, left ventricular hypertrophy, diabetic neuropathy and per patient report unexplained weight loss (25-35lbs in past year) and a heart murmur    Examination-Activity Limitations Bathing;Bend;Hygiene/Grooming;Dressing;Carry;Lift;Locomotion Level;Stairs;Squat;Stand;Transfers;Other   walking, stairs, lifting items including groceries, balance, bending over, moving her neck, performing ADL's such as getting dressed, showering, bathing.   Examination-Participation Restrictions Driving;Cleaning;Interpersonal Relationship;Yard Work;Community Activity;Laundry;Shop    Stability/Clinical Decision Making Unstable/Unpredictable    Rehab Potential Fair    PT Frequency 2x / week    PT Duration 12 weeks    PT Treatment/Interventions Cryotherapy;ADLs/Self Care Home Management;Moist Heat;Gait training;Stair training;Therapeutic activities;Functional mobility training;Therapeutic exercise;Balance training;Neuromuscular re-education;Patient/family education;Manual techniques;Passive range of motion;Vestibular;Dry needling;Biofeedback;Canalith Repostioning;DME Instruction    PT Next Visit Plan Progress vestibular, strength, and balance activities as tolerated.    PT Home Exercise Plan Medbridge Access Code: [email or web address]    Consulted and Agree with Plan of Care Patient             Patient will benefit from skilled therapeutic intervention in  order to improve the following deficits and impairments:  Impaired perceived functional ability, Abnormal gait, Decreased activity tolerance, Decreased range of motion, Decreased strength, Dizziness, Hypomobility, Impaired sensation, Pain, Decreased balance, Decreased mobility, Difficulty walking, Impaired UE functional use  Visit Diagnosis: Unsteadiness on feet  Repeated falls  Cervicalgia  Dizziness and giddiness  Muscle weakness (generalized)     Problem List Patient Active Problem List   Diagnosis Date Noted   Cervical myelopathy ([place]) 11/15/2020   LVH (left ventricular hypertrophy) 10/14/2020   COVID-19 05/19/2020   MRSA cellulitis 04/19/2018   Decreased libido 02/04/2018   Diabetes mellitus with neuropathy ([place]) 04/20/2017   H/O adenomatous polyp of colon 08/20/2015   Rupture of implant of right breast 05/18/2015   Anxiety disorder 04/26/2015   Allergic rhinitis 04/21/2015   Anemia 04/21/2015   Asthma 04/21/2015   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/21/2015   Chronic infection of sinus 04/21/2015   Depression 04/21/2015   GERD (gastroesophageal reflux disease) 04/21/2015   Headache 04/21/2015   Menopausal symptoms 04/21/2015   Obesity 04/21/2015   Palpitations 04/21/2015   Vitamin B12 deficiency 04/21/2015   Aortic stenosis 58/68/2574   Diastolic dysfunction 93/55/2174   Diabetes mellitus out of control 06/12/2006   Barrett esophagus 06/13/2003   Essential hypertension 06/12/1998   Genital herpes 06/12/1998   Hyperlipidemia, mixed 06/12/1998    Everlean Alstrom. Graylon Good, PT, DPT 03/29/21, 6:15 PM   [facility] [address] [place], [address] Phone: [phone]   Fax:  [phone]  Name: Katie Buckley MRN: [id number] Date of Birth: July 27, 1950

## 2021-03-30 NOTE — Telephone Encounter (Signed)
This form was received and placed on your desk Monday evening (03/28/2021). Do you still have the form? I can complete it if you still have it.

## 2021-03-31 ENCOUNTER — Ambulatory Visit: Payer: PPO | Admitting: Physical Therapy

## 2021-03-31 DIAGNOSIS — F419 Anxiety disorder, unspecified: Secondary | ICD-10-CM | POA: Diagnosis not present

## 2021-03-31 DIAGNOSIS — R011 Cardiac murmur, unspecified: Secondary | ICD-10-CM | POA: Diagnosis not present

## 2021-03-31 DIAGNOSIS — R55 Syncope and collapse: Secondary | ICD-10-CM | POA: Diagnosis not present

## 2021-03-31 DIAGNOSIS — I10 Essential (primary) hypertension: Secondary | ICD-10-CM | POA: Diagnosis not present

## 2021-03-31 DIAGNOSIS — R0602 Shortness of breath: Secondary | ICD-10-CM | POA: Diagnosis not present

## 2021-03-31 DIAGNOSIS — R0989 Other specified symptoms and signs involving the circulatory and respiratory systems: Secondary | ICD-10-CM | POA: Diagnosis not present

## 2021-03-31 DIAGNOSIS — I350 Nonrheumatic aortic (valve) stenosis: Secondary | ICD-10-CM | POA: Diagnosis not present

## 2021-03-31 DIAGNOSIS — E1165 Type 2 diabetes mellitus with hyperglycemia: Secondary | ICD-10-CM | POA: Diagnosis not present

## 2021-03-31 DIAGNOSIS — E782 Mixed hyperlipidemia: Secondary | ICD-10-CM | POA: Diagnosis not present

## 2021-03-31 DIAGNOSIS — K219 Gastro-esophageal reflux disease without esophagitis: Secondary | ICD-10-CM | POA: Diagnosis not present

## 2021-03-31 DIAGNOSIS — R002 Palpitations: Secondary | ICD-10-CM | POA: Diagnosis not present

## 2021-03-31 DIAGNOSIS — R270 Ataxia, unspecified: Secondary | ICD-10-CM | POA: Diagnosis not present

## 2021-04-05 ENCOUNTER — Ambulatory Visit: Payer: PPO | Admitting: Physical Therapy

## 2021-04-05 ENCOUNTER — Encounter: Payer: Self-pay | Admitting: Physical Therapy

## 2021-04-05 ENCOUNTER — Telehealth: Payer: PPO

## 2021-04-05 DIAGNOSIS — R2681 Unsteadiness on feet: Secondary | ICD-10-CM | POA: Diagnosis not present

## 2021-04-05 DIAGNOSIS — M6281 Muscle weakness (generalized): Secondary | ICD-10-CM

## 2021-04-05 DIAGNOSIS — R42 Dizziness and giddiness: Secondary | ICD-10-CM

## 2021-04-05 DIAGNOSIS — R296 Repeated falls: Secondary | ICD-10-CM

## 2021-04-05 DIAGNOSIS — M542 Cervicalgia: Secondary | ICD-10-CM

## 2021-04-05 NOTE — Therapy (Signed)
[facility] [address], [place], [address] Phone: [phone]   Fax:  [phone]  Physical Therapy Treatment / Progress Note / Re-Certification Dates of reporting: 03/03/2021 - 04/05/2021  Patient Details  Name: Katie Buckley MRN: [id number] Date of Birth: Aug 13, 1950 Referring Provider (PT): Loleta Dicker, [place]   Encounter Date: 04/05/2021   PT End of Session - 04/05/21 1649     Visit Number 15    Number of Visits 10    Date for PT Re-Evaluation 06/28/21    Authorization Type [employer]: (Reporting period from 03/03/2021)    Progress Note Due on Visit 20    PT Start Time 1605    PT Stop Time 1645    PT Time Calculation (min) 40 min    Equipment Utilized During Treatment Gait belt    Activity Tolerance Patient tolerated treatment well    Behavior During Therapy [facility] for tasks assessed/performed             Past Medical History:  Diagnosis Date   Anxiety    Aortic stenosis, mild    a.) mean gradient 18.7 mmHg on 10/2020 TTE   Depression    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   T2DM (type 2 diabetes mellitus) ([place])     Past Surgical History:  Procedure Laterality Date   ANTERIOR CERVICAL DECOMP/DISCECTOMY FUSION N/A 11/15/2020   Procedure: ANTERIOR CERVICAL DECOMPRESSION/DISCECTOMY FUSION 1 LEVEL C3/4;  Surgeon: Deetta Perla, MD;  Location: [facility] ORS;  Service: Neurosurgery;  Laterality: N/A;   AUGMENTATION MAMMAPLASTY Bilateral    breast implants   Bone Spur  2007   foot   CARDIAC CATHETERIZATION     Carpal Tunnel Symdrome   2008   as repeated in 2009   CATARACT EXTRACTION W/PHACO Left 01/16/2019   Procedure: CATARACT EXTRACTION PHACO AND INTRAOCULAR LENS PLACEMENT ([place]) LEFT DIABETES VISION BLUE;  Surgeon: Marchia Meiers, MD;  Location: [facility] ORS;  Service: Ophthalmology;  Laterality: Left;  [place]  00:47 CDE 6.62 Fluid pack lot # [id number]   CATARACT EXTRACTION W/PHACO Right 02/06/2019   Procedure: CATARACT EXTRACTION PHACO AND INTRAOCULAR LENS PLACEMENT ([facility]);  Surgeon: Marchia Meiers, MD;  Location: [facility] ORS;  Service: Ophthalmology;  Laterality: Right;  [place] 00:54.6 CDE 6.52 FLUID PACK LOT # [id number]   CHOLECYSTECTOMY  2007   COLONOSCOPY WITH PROPOFOL N/A 11/17/2015   Procedure: COLONOSCOPY WITH PROPOFOL;  Surgeon: Manya Silvas, MD;  Location: [facility] ENDOSCOPY;  Service: Endoscopy;  Laterality: N/A;   ESOPHAGOGASTRODUODENOSCOPY (EGD) WITH PROPOFOL N/A 11/17/2015   Procedure: ESOPHAGOGASTRODUODENOSCOPY (EGD) WITH PROPOFOL;  Surgeon: Manya Silvas, MD;  Location: [facility] ENDOSCOPY;  Service: Endoscopy;  Laterality: N/A;   EYE SURGERY Bilateral    cataract extraction   FOOT SURGERY     MANDIBLE SURGERY     NASAL SINUS SURGERY  03/2013   Multile surgery Dr. Carlis Abbott. 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 Tamala Julian   TONSILLECTOMY  1960    There were  no vitals filed for this visit.   Subjective Assessment - 04/05/21 1606     Subjective Patient reports she is feeling "a liitle off" with her balance today. States she has not had any falls since last PT session. States she saw the cardiologist last Thursday that made her too late to come to PT that day. She now needs to have 3 more tests. She reports neck feels kind of stiff and she is having pain coming of from her L neck over hear head to the right ear. States she feels she has made improvement in PT but she is not where she needs to be and would like to continue. Reports current pain is 5/10. States she got her daughter settled in  rehab yesterday. [facility] called her and will call back to schedule her there.    Pertinent History Patient is a 70 year old female who presents to outpatient physical therapy with a referral for s/p cervical spinal fusion (ANTERIOR CERVICAL DECOMPRESSION/DISCECTOMY FUSION 1 LEVEL C3/4, 11/15/2020), cervicalgia, multifactoral causes for imbalance, cervical myelopathy. This patient's chief complaints consist of imbalance, history of falls, neck pain, and neck hypomobility, leading to the following functional deficits: difficulty with walking, stairs, lifting items including groceries, balance, bending over, moving her neck, performing ADL's such as getting dressed, showering, bathing. Relevant past medical history and comorbidities include anxiety, aortic stenosis, depression, skin cancer, GERD, heart murmur, hyperchloremia, hypertension, neuropathy, skin cancer, type II diabetes mellitus, asthma, anxiety, left ventricular hypertrophy, diabetic neuropathy and per patient report unexplained weight loss (25-35lbs in past year) and a heart murmur. Patient denies history of stroke, seizure, lung problems, bowel or bladder changes, groin numbness or tingling.    Limitations Lifting;Walking;Standing;House hold activities;Other (comment)   walking, stairs, lifting items including groceries, balance, bending over, moving her neck, performing ADL's such as getting dressed, showering, bathing.   Patient Stated Goals "build up body strength and try to get imbalance under control"    Currently in Pain? Yes    Pain Score 5     Pain Onset More than a month ago                [facility] PT Assessment - 04/05/21 0001       Functional Gait  Assessment   Gait assessed  Yes    Gait Level Surface Walks 20 ft in less than 5.5 sec, no assistive devices, good speed, no evidence for imbalance, normal gait pattern, deviates no more than 6 in outside of the 12 in walkway width.   4.82 seconds   Change  in Gait Speed Able to smoothly change walking speed without loss of balance or gait deviation. Deviate no more than 6 in outside of the 12 in walkway width.    Gait with Horizontal Head Turns Performs head turns smoothly with slight change in gait velocity (eg, minor disruption to smooth gait path), deviates 6-10 in outside 12 in walkway width, or uses an assistive device.    Gait with Vertical Head Turns Performs head turns with no change in gait. Deviates no more than 6 in outside 12 in walkway width.    Gait and Pivot Turn Pivot turns safely within 3 sec and stops quickly with no loss of balance.    Step Over Obstacle Is able to step over 2 stacked shoe boxes taped together (9 in total height) without changing gait speed. No evidence of imbalance.    Gait with Narrow Base of Support Ambulates  4-7 steps.    Gait with Eyes Closed Cannot walk 20 ft without assistance, severe gait deviations or imbalance, deviates greater than 15 in outside 12 in walkway width or will not attempt task.    Ambulating Backwards Walks 20 ft, uses assistive device, slower speed, mild gait deviations, deviates 6-10 in outside 12 in walkway width.    Steps Alternating feet, must use rail.    Total Score 22    FGA comment: 19-24 = medium risk fall            OBJECTIVE   VITALS BP 146/60 mmHg, seated, left arm, manual adult cuff. HR 75 bpm SpO2 98% Patient state she did take her BP medication today  SELF-REPORTED FUNCTION FOTO score: 51/100 (neck questionnaire)  SPINE MOTION Cervical Spine AROM R/L - (60) Cervical Flexion: 50 painful - (80) Cervical Extension: 30  - (45/45) Cervical Lateral Flexion: 30/25 pain to left with left sidebend, pain in center of neck with R sidebend - (85/85) Cervical Rotation (R/L) 63/56 ipsilateral tension/neck pain (#) Average/norm value in degrees *Patient reports slight pain in L neck with L sided cervical rotation.    LE MUSCLE PERFORMANCE (MMT):  *Indicates pain 01/12/21  03/03/21 04/05/21  Joint/Motion R/L R/L R/L  Hip        Flexion 3+/3+  3+/3+ 5/5   Extension (knee ext)      3+/3+  Abduction     4*/4   Knee        Extension 4+/4+  5/5  5/5  Flexion 4/4  5/5  5/5  Ankle/Foot        Dorsiflexion  3+/3+  3+/4+  4/4  Great toe extension 5/5  5/5  5/5  Eversion 5/5  5/5  5/5  Plantarflexion 4/4  5/5 5/5     PALPATION - TTP with reproduction of right ear pain with pressure to left and right suboccipital muscles.    FUNCTIONAL/BALANCE TESTS: Functional Gait Assessment: 22/30 moderate fall risk (see above for details).      TREATMENT:    Therapeutic exercise: to centralize symptoms and improve ROM, strength, muscular endurance, and activity tolerance required for successful completion of functional activities. - vitals check for safety (see above).  - Objective measurements completed on examination: See above findings.  Manual therapy: to reduce pain and tissue tension, improve range of motion, neuromodulation, in order to promote improved ability to complete functional activities. - supine STM to posterior cervical spine musculature focusing on suboccipital region.      Pt required multimodal cuing for proper technique and to facilitate improved neuromuscular control, strength, range of motion, and functional ability resulting in improved performance. Patient required CGA - min A for safety.     PT Education - 04/05/21 1852     Education Details form/technique with exercise. POC. progress    Person(s) Educated Patient    Methods Explanation;Demonstration;Tactile cues;Verbal cues    Comprehension Verbalized understanding;Returned demonstration;Verbal cues required;Tactile cues required;Need further instruction              PT Short Term Goals - 02/17/21 1619       PT SHORT TERM GOAL #1   Title Patient will be independent with inital home exercise program to improve strength/mobility/balance and functional independence with ADLs.     Baseline HEP to be provided at 2nd visit    Time 3    Period Weeks    Status Achieved    Target Date 02/03/21  PT Long Term Goals - 04/05/21 1610       PT LONG TERM GOAL #1   Title Patient will be independent with long term home exercise program to improve strength/mobility/balance and functional independence with ADLs.    Baseline Patient reports HEP compliance (03/03/2021); continues to perform HEP (04/05/2021);    Time 12    Period Weeks    Status Partially Met   TARGET DATE FOR ALL LONG TERM GOALS: 04/06/2021. UNMET GOALS UPDATED TO: 06/28/2021     PT LONG TERM GOAL #2   Title Improve cervical AROM rotation to equal or greater than 60 degrees each direction to improve ability to check blind spot when driving, veiw surroundings, and allow patient to complete valued activities with less difficulty.    Baseline 30 L, 40 R (01/12/2021); 65 L, 71 R. Patient reports pain with L rotation (03/03/2021);  R = 63, L = 56 ipsilateral tension/neck pain (04/05/2021);    Time 12    Period Weeks    Status Partially Met      PT LONG TERM GOAL #3   Title Patient will score equal or greater than 25/30 on Functional Gait Assessment to demonstrate reduction in fall risk to low fall risk classification.    Baseline 11/30 (01/12/2021); 19/30 (03/03/2021); 22/30 (04/05/2021);    Time 12    Period Weeks    Status Partially Met      PT LONG TERM GOAL #4   Title Pt will increase LE strength of by at least 1/2 MMT grade in order to demonstrate improvement in strength, function, and general mobility    Baseline BIL hip flexion 3+, knee extension 4+ (01/13/2021); BIL hip flexion 3+, BIL knee extension 5/5 (03/03/2021); continues to improve with most motions - see objective exam (04/05/2021);    Time 12    Period Weeks    Status Partially Met      PT LONG TERM GOAL #5   Title Demonstrate improved FOTO score to 53 or greater to demonstrate improvement in overall condition and self-reported  functional ability    Baseline 45 (01/10/2021); 48 (03/03/2021); 51 (04/05/2021);    Time 12    Period Weeks    Status Partially Met                   Plan - 04/05/21 1859     Clinical Impression Statement Patient has attended 15 physical therapy sessions this episode of care and continues to show improvements in balance, LE strength, HEP participation/advancement, and self-reported function. Her cervical spine AROM was less today than it was at visit 10 but still significantly improved form baseline. Of note, she was having slightly elevated neck pain today that appeared to be limiting her ROM. Overall, patient appears to be improving but she has had episodes of decreased awareness and unsteadiness and has not yet shown good stability in her improvements. Her symptoms do appear to be worse when stress levels from psychosocial factors are high. She would benefit from continued PT to further decrease her fall risk and improved her function and quality of life.Patient would benefit from continued management of limiting condition by skilled physical therapist to address remaining impairments and functional limitations to work towards stated goals and return to PLOF or maximal functional independence.    Personal Factors and Comorbidities Comorbidity 3+;Past/Current Experience;Time since onset of injury/illness/exacerbation    Comorbidities anxiety, aortic stenosis, depression, skin cancer, GERD, heart murmur, hyperchloremia, hypertension, neuropathy, skin cancer, type II diabetes mellitus,  asthma, anxiety, left ventricular hypertrophy, diabetic neuropathy and per patient report unexplained weight loss (25-35lbs in past year) and a heart murmur    Examination-Activity Limitations Bathing;Bend;Hygiene/Grooming;Dressing;Carry;Lift;Locomotion Level;Stairs;Squat;Stand;Transfers;Other   walking, stairs, lifting items including groceries, balance, bending over, moving her neck, performing ADL's such as  getting dressed, showering, bathing.   Examination-Participation Restrictions Driving;Cleaning;Interpersonal Relationship;Yard Work;Community Activity;Laundry;Shop    Stability/Clinical Decision Making Unstable/Unpredictable    Rehab Potential Fair    PT Frequency 2x / week    PT Duration 12 weeks    PT Treatment/Interventions Cryotherapy;ADLs/Self Care Home Management;Moist Heat;Gait training;Stair training;Therapeutic activities;Functional mobility training;Therapeutic exercise;Balance training;Neuromuscular re-education;Patient/family education;Manual techniques;Passive range of motion;Vestibular;Dry needling;Biofeedback;Canalith Repostioning;DME Instruction    PT Next Visit Plan Progress vestibular, strength, and balance activities as tolerated.    PT Home Exercise Plan Medbridge Access Code: [email or web address]    Consulted and Agree with Plan of Care Patient             Patient will benefit from skilled therapeutic intervention in order to improve the following deficits and impairments:  Impaired perceived functional ability, Abnormal gait, Decreased activity tolerance, Decreased range of motion, Decreased strength, Dizziness, Hypomobility, Impaired sensation, Pain, Decreased balance, Decreased mobility, Difficulty walking, Impaired UE functional use  Visit Diagnosis: Unsteadiness on feet  Repeated falls  Cervicalgia  Dizziness and giddiness  Muscle weakness (generalized)     Problem List Patient Active Problem List   Diagnosis Date Noted   Cervical myelopathy ([place]) 11/15/2020   LVH (left ventricular hypertrophy) 10/14/2020   COVID-19 05/19/2020   MRSA cellulitis 04/19/2018   Decreased libido 02/04/2018   Diabetes mellitus with neuropathy ([place]) 04/20/2017   H/O adenomatous polyp of colon 08/20/2015   Rupture of implant of right breast 05/18/2015   Anxiety disorder 04/26/2015   Allergic rhinitis 04/21/2015   Anemia 04/21/2015   Asthma 04/21/2015   Burning feet syndrome  04/21/2015   Chronic infection of sinus 04/21/2015   Depression 04/21/2015   GERD (gastroesophageal reflux disease) 04/21/2015   Headache 04/21/2015   Menopausal symptoms 04/21/2015   Obesity 04/21/2015   Palpitations 04/21/2015   Vitamin B12 deficiency 04/21/2015   Aortic stenosis 14/78/2956   Diastolic dysfunction 21/30/8657   Diabetes mellitus out of control 06/12/2006   Barrett esophagus 06/13/2003   Essential hypertension 06/12/1998   Genital herpes 06/12/1998   Hyperlipidemia, mixed 06/12/1998    Everlean Alstrom. Graylon Good, PT, DPT 04/05/21, 7:02 PM   [facility] [address], [place], [address] Phone: [phone]   Fax:  [phone]  Name: Katie Buckley MRN: [id number] Date of Birth: 14-Mar-1951

## 2021-04-05 NOTE — Progress Notes (Deleted)
Chronic Care Management Pharmacy Note  04/05/2021 Name:  Katie Buckley MRN:  [id number] DOB:  [birth year]  Summary: ***  Recommendations/Changes made from today's visit: ***  Plan: ***   Subjective: Katie Buckley is an 70 y.o. year old female who is a primary patient of Fisher, Kirstie Peri, MD.  The CCM team was consulted for assistance with disease management and care coordination needs.    Engaged with patient by telephone for initial visit in response to provider referral for pharmacy case management and/or care coordination services.   Consent to Services:  The patient was given the following information about Chronic Care Management services today, agreed to services, and gave verbal consent: 1. CCM service includes personalized support from designated clinical staff supervised by the primary care provider, including individualized plan of care and coordination with other care providers 2. 24/7 contact phone numbers for assistance for urgent and routine care needs. 3. Service will only be billed when office clinical staff spend 20 minutes or more in a month to coordinate care. 4. Only one practitioner may furnish and bill the service in a calendar month. 5.The patient may stop CCM services at any time (effective at the end of the month) by phone call to the office staff. 6. The patient will be responsible for cost sharing (co-pay) of up to 20% of the service fee (after annual deductible is met). Patient agreed to services and consent obtained.  Patient Care Team: [name], MD as PCP - General (Family Medicine) Chauncey Mann, MD as Referring Physician (Psychiatry) Vladimir Crofts, MD as Consulting Physician (Neurology) Deetta Perla, MD as Consulting Physician (Neurosurgery) Germaine Pomfret, [facility] (Pharmacist)  Recent office visits: ***  Recent consult visits: [facility] visits: {Hospital DC Yes/No:25215}   Objective:  Lab Results  Component Value Date    CREATININE 0.84 02/09/2021   BUN 11 02/09/2021   GFRNONAA >60 11/15/2020   GFRAA 89 11/19/2019   NA 135 02/09/2021   K 4.9 02/09/2021   CALCIUM 9.5 02/09/2021   CO2 24 02/09/2021   GLUCOSE 353 (H) 02/09/2021    Lab Results  Component Value Date/Time   HGBA1C 9.2 (A) 03/25/2021 11:01 AM   HGBA1C 11.3 (A) 02/09/2021 09:49 AM   HGBA1C 9.2 (H) 09/01/2020 09:07 AM   HGBA1C 11.8 (H) 06/17/2019 03:44 PM   HGBA1C 8.5 07/13/2016 12:00 AM   HGBA1C 9.2 (H) 03/27/2013 06:21 PM    Last diabetic Eye exam:  Lab Results  Component Value Date/Time   HMDIABEYEEXA Retinopathy (A) 06/30/2020 12:00 AM    Last diabetic Foot exam: No results found for: HMDIABFOOTEX   Lab Results  Component Value Date   CHOL 236 (H) 02/09/2021   HDL 88 02/09/2021   LDLCALC 108 (H) 02/09/2021   TRIG 238 (H) 02/09/2021   CHOLHDL 2.7 02/09/2021    Hepatic Function Latest Ref Rng & Units 02/09/2021 09/01/2020 05/31/2020  Total Protein 6.0 - 8.5 g/dL - 6.3 7.4  Albumin 3.8 - 4.8 g/dL 4.5 4.2 4.0  AST 0 - 40 IU/L - 17 22  ALT 0 - 32 IU/L - 15 17  Alk Phosphatase 44 - 121 IU/L - 111 79  Total Bilirubin 0.0 - 1.2 mg/dL - 0.2 0.6    Lab Results  Component Value Date/Time   TSH 1.650 09/01/2020 09:07 AM   TSH 1.380 11/19/2019 09:58 AM    CBC Latest Ref Rng & Units 11/03/2020 09/29/2020 09/01/2020  WBC 4.0 - 10.5 K/uL  7.4 10.8(H) 6.7  Hemoglobin 12.0 - 15.0 g/dL 11.8(L) 13.3 11.2  Hematocrit 36.0 - 46.0 % 35.2(L) 39.9 35.2  Platelets 150 - 400 K/uL 202 278 212    Lab Results  Component Value Date/Time   VD25OH 31.5 09/01/2020 09:07 AM    Clinical ASCVD: {YES/NO:21197} The 10-year ASCVD risk score (Arnett DK, et al., 2019) is: 22.6%   Values used to calculate the score:     Age: 16 years     Sex: Female     Is Non-Hispanic African American: No     Diabetic: Yes     Tobacco smoker: No     Systolic Blood Pressure: 335 mmHg     Is BP treated: Yes     HDL Cholesterol: 88 mg/dL     Total Cholesterol:  236 mg/dL    Depression screen [facility] 2/9 02/09/2021 10/27/2020 09/01/2020  Decreased Interest _0 Down, Depressed, Hopeless _1 PHQ - 2 Score _2 Altered sleeping 0 1 1  Tired, decreased energy 0 1 2  Change in appetite 0 1 1  Feeling bad or failure about yourself  0 1 1  Trouble concentrating 0 1 1  Moving slowly or fidgety/restless 0 0 1  Suicidal thoughts 0 0 0  PHQ-9 Score _3 Difficult doing work/chores Not difficult at all Somewhat difficult Somewhat difficult  Some recent data might be hidden     ***Other: (CHADS2VASc if Afib, MMRC or CAT for COPD, ACT, DEXA)  Social History   Tobacco Use  Smoking Status Never  Smokeless Tobacco Never   BP Readings from Last 3 Encounters:  03/29/21 (!) 170/48  03/25/21 (!) 150/46  03/24/21 (!) 168/70   Pulse Readings from Last 3 Encounters:  03/29/21 75  03/25/21 68  03/24/21 (!) 106   Wt Readings from Last 3 Encounters:  03/25/21 140 lb (63.5 kg)  02/09/21 143 lb (64.9 kg)  11/15/20 142 lb (64.4 kg)   BMI Readings from Last 3 Encounters:  03/25/21 27.34 kg/m  02/09/21 27.93 kg/m  11/15/20 27.73 kg/m    Assessment/Interventions: Review of patient past medical history, allergies, medications, health status, including review of consultants reports, laboratory and other test data, was performed as part of comprehensive evaluation and provision of chronic care management services.   SDOH:  (Social Determinants of Health) assessments and interventions performed: Yes  SDOH Screenings   Alcohol Screen: Low Risk    Last Alcohol Screening Score (AUDIT): 0  Depression (PHQ2-9): Low Risk    PHQ-2 Score: 2  Financial Resource Strain: Not on file  Food Insecurity: Not on file  Housing: Not on file  Physical Activity: Not on file  Social Connections: Not on file  Stress: Not on file  Tobacco Use: Low Risk    Smoking Tobacco Use: Never   Smokeless Tobacco Use: Never   Passive Exposure: Not on file  Transportation  Needs: Not on file    CCM Care Plan  Allergies  Allergen Reactions   Doxycycline Other (See Comments)    Emotional changes/anxious   Biaxin [Clarithromycin]     GI upset, and bad taste in mouth.    Medications Reviewed Today     Reviewed by Nancy Nordmann, PT (Physical Therapist) on 03/29/21 at 1604  Med List Status: <None>   Medication Order Taking? Sig Documenting Provider Last Dose Status Informant  amoxicillin (AMOXIL) 500 MG capsule [id number]  Take 2 capsules (1,000 mg total) by  mouth 2 (two) times daily for 7 days. [name], MD  Active   azelastine (ASTELIN) 0.1 % nasal spray [id number] No Place 1-2 sprays into both nostrils every 12 (twelve) hours as needed. [provider] Taking Active Self  buPROPion (WELLBUTRIN SR) 100 MG 12 hr tablet [id number] No Take 100 mg by mouth 2 (two) times daily. [provider] Taking Active Self           Med Note Lynetta Mare Feb 09, 2021  9:42 AM) Patient taking 150 mg BID  butalbital-acetaminophen-caffeine (FIORICET) 50-325-40 MG tablet [id number] No TAKE 1 TABLET BY MOUTH EVERY 6 HOURS AS NEEDED FOR HEADACHE [name], MD Taking Active   empagliflozin (JARDIANCE) 25 MG TABS tablet [id number] No Take 1 tablet (25 mg total) by mouth daily. [name], MD Taking Active Self  Esomeprazole Magnesium 20 MG TBEC [id number] No Take 20 mg by mouth daily before breakfast. [provider] Taking Active Self  ezetimibe (ZETIA) 10 MG tablet [id number] No TAKE 1 TABLET BY MOUTH EVERYDAY AT BEDTIME  Patient taking differently: Take 10 mg by mouth at bedtime.   [name], MD Taking Active   fexofenadine [facility]) 180 MG tablet [id number] No Take 180 mg by mouth daily as needed for allergies or rhinitis. [provider] Taking Active Self  fluconazole (DIFLUCAN) 150 MG tablet [id number] No Take one tablet on day you receive medication Take second tablet three days later Take third and last  tablet three days later Gwyneth Sprout, FNP Taking Active   gabapentin (NEURONTIN) 300 MG capsule [id number] No TAKE 2 CAPSULES (600 MG TOTAL) BY MOUTH 2 (TWO) TIMES DAILY. MORNING & SUPPER [name], MD Taking Active   glipiZIDE (GLUCOTROL) 10 MG tablet [id number] No TAKE 1 TABLET BY MOUTH EVERY DAY BEFORE BREAKFAST Fisher, Kirstie Peri, MD Taking Active   hydrOXYzine (VISTARIL) 25 MG capsule [id number] No Take 25 mg by mouth daily as needed for anxiety. [provider] Taking Active Self  irbesartan (AVAPRO) 75 MG tablet [id number] No TAKE 1 TABLET BY MOUTH EVERY DAY [name], MD Taking Active   Lancet Devices MISC [id number] No  [provider] Taking Active Self           Med Note Kenton Kingfisher, Earley Favor   Fri Aug 23, 2018 10:57 AM)    metFORMIN (GLUCOPHAGE-XR) 500 MG 24 hr tablet [id number] No Take 2 tablets (1,000 mg total) by mouth daily.  Patient taking differently: Take 500 mg by mouth daily.   [name], MD Taking Active   methocarbamol (ROBAXIN) 500 MG tablet [id number] No Take 1 tablet (500 mg total) by mouth every 6 (six) hours as needed for muscle spasms. Deetta Perla, MD Taking Active   metoprolol-hydrochlorothiazide (LOPRESSOR HCT) 100-25 MG tablet [id number] No Take 0.5 tablets by mouth daily. [name], MD Taking Active Self  montelukast (SINGULAIR) 10 MG tablet [id number] No TAKE 1 TABLET BY MOUTH EVERY DAY  Patient taking differently: Take 10 mg by mouth daily as needed (allergies).   [name], MD Taking Active   Olopatadine HCl 0.7 % SOLN [id number] No Apply 1 drop to eye daily as needed. [provider] Taking Active Self  ondansetron (ZOFRAN ODT) 4 MG disintegrating tablet [id number] No Take 1 tablet (4 mg total) by mouth every 8 (eight) hours as needed for nausea or vomiting. Harvest Dark, MD Taking Active Self  ondansetron [facility]) 4 MG tablet [id number]  No TAKE 1 TABLET BY MOUTH EVERY 8 HOURS AS NEEDED [name], MD  Taking Active   oxyCODONE (OXY IR/ROXICODONE) 5 MG immediate release tablet [id number] No Take 1 tablet (5 mg total) by mouth every 6 (six) hours as needed for moderate pain ((score 4 to 6)). Deetta Perla, MD Taking Active   rosuvastatin (CRESTOR) 20 MG tablet [id number] No TAKE 1 TABLET BY MOUTH EVERY DAY IN THE Georgianne Fick, MD Taking Active   senna (SENOKOT) 8.6 MG TABS tablet [id number] No Take 1 tablet (8.6 mg total) by mouth 2 (two) times daily. Deetta Perla, MD Taking Active   sertraline (ZOLOFT) 100 MG tablet [id number] No Take 200 mg by mouth daily. [provider] Taking Active Self  tirzepatide Darcel Bayley) 5 MG/0.5ML Pen [id number] No Inject 5 mg into the skin once a week. [name], MD Taking Active   traZODone (DESYREL) 50 MG tablet [id number] No Take 50-100 mg by mouth at bedtime as needed for sleep.  [provider] Taking Active Self  valACYclovir (VALTREX) 1000 MG tablet [id number] No Take 1 tablet daily for 5 days as needed [name], MD Taking Active Self            Patient Active Problem List   Diagnosis Date Noted   Cervical myelopathy ([place]) 11/15/2020   LVH (left ventricular hypertrophy) 10/14/2020   COVID-19 05/19/2020   MRSA cellulitis 04/19/2018   Decreased libido 02/04/2018   Diabetes mellitus with neuropathy ([place]) 04/20/2017   H/O adenomatous polyp of colon 08/20/2015   Rupture of implant of right breast 05/18/2015   Anxiety disorder 04/26/2015   Allergic rhinitis 04/21/2015   Anemia 04/21/2015   Asthma 04/21/2015   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/21/2015   Chronic infection of sinus 04/21/2015   Depression 04/21/2015   GERD (gastroesophageal reflux disease) 04/21/2015   Headache 04/21/2015   Menopausal symptoms 04/21/2015   Obesity 04/21/2015   Palpitations 04/21/2015   Vitamin B12 deficiency 04/21/2015   Aortic stenosis 20/03/711   Diastolic dysfunction 19/75/8832   Diabetes mellitus out of control 06/12/2006    Barrett esophagus 06/13/2003   Essential hypertension 06/12/1998   Genital herpes 06/12/1998   Hyperlipidemia, mixed 06/12/1998    Immunization History  Administered Date(s) Administered   Fluad Quad(high Dose 65+) 02/09/2021   Hep A / Hep B 02/15/2011, 03/29/2011, 09/06/2011   Influenza, High Dose Seasonal PF 04/18/2017   Influenza,inj,Quad PF,6+ Mos 04/23/2015, 02/17/2016   Influenza-Unspecified 03/25/2019   PFIZER(Purple Top)SARS-COV-2 Vaccination 08/03/2019, 08/27/2019   Pneumococcal Conjugate-13 04/18/2017   Pneumococcal Polysaccharide-23 11/27/2011, 03/29/2018   Tdap 10/09/2007   Zoster, Live 11/27/2011    Conditions to be addressed/monitored:  Hypertension, Hyperlipidemia, Diabetes, Asthma, Depression, Anxiety, and Allergic Rhinitis  There are no care plans that you recently modified to display for this patient.    Medication Assistance: {MEDASSISTANCEINFO:25044}  Compliance/Adherence/Medication fill history: Care Gaps: ***  Star-Rating Drugs: ***  Patient's preferred pharmacy is:  [id number]: [phone]: [phone]  [id number] [place]one: [phone]: [phone] pill box? {Yes or If no, why not?:20788} Pt endorses ***% compliance  We discussed: {Pharmacy options:24294} Patient decided to: {US Pharmacy Plan:23885}  Care Plan and Follow Up Patient Decision:  {FOLLOWUP:24991}  Plan: {CM FOLLOW UP PLHAFB:90383}***  Current Barriers:  {pharmacybarriers:24917}  Pharmacist Clinical Goal(s):  Patient will {PHARMACYGOALCHOICES:24921} through collaboration with PharmD and provider.   Interventions: 1:1 collaboration with [name], MD regarding development and update of comprehensive plan of care as evidenced by provider attestation and  co-signature Inter-disciplinary care team collaboration (see longitudinal plan of care) Comprehensive medication review performed; medication list updated in electronic medical record  Hypertension (BP goal {CHL HP UPSTREAM Pharmacist BP ranges:[phone]}) -{US controlled/uncontrolled:25276} -Current treatment: Irbesartan 75 mg daily  Metoprolol-HCTZ 100-25 1/2 tablet daily  -Medications previously tried: ***  -Current home readings: *** -Current dietary habits: *** -Current exercise habits: *** -{ACTIONS;DENIES/REPORTS:21021675::"Denies"} hypotensive/hypertensive symptoms -Educated on {CCM BP Counseling:25124} -Counseled to monitor BP at home ***, document, and provide log at future appointments -{CCMPHARMDINTERVENTION:25122}  Hyperlipidemia: (LDL goal < ***) -{US controlled/uncontrolled:25276} -Current treatment: Ezetimibe 10 mg nightly  Rosuvastatin 20 mg nightly  -Medications previously tried: ***  -Current dietary patterns: *** -Current exercise habits: *** -Educated on {CCM HLD Counseling:25126} -{CCMPHARMDINTERVENTION:25122}  Diabetes (A1c goal <8%) -Uncontrolled -Current medications: Jardiance 25 mg daily  Glipizide 10 mg daily before breakfast  Metformin XR 2 tablets daily (takes one tablet daily) Mounjaro 5 mg weekly  -Medications previously tried: ***  -Current home glucose readings fasting glucose: *** post prandial glucose: *** -{ACTIONS;DENIES/REPORTS:21021675::"Denies"} hypoglycemic/hyperglycemic symptoms -Current meal patterns:  breakfast: ***  lunch: ***  dinner: *** snacks: *** drinks: *** -Current exercise: *** -Educated on {CCM DM COUNSELING:25123} -Counseled to check feet daily and get yearly eye exams -{CCMPHARMDINTERVENTION:25122}  Depression/Anxiety (Goal: ***) -{US controlled/uncontrolled:25276} -Current treatment: Wellbutrin SR 100 mg twice daily  Hydroxyzine 25 mg daily as needed  Sertraline 100 mg 2 tablets daily  Trazodone 50 mg  1-2 tablets nightly as needed  -Medications previously tried/failed: *** -PHQ9: *** -GAD7: *** -Connected with *** for mental health support -Educated on {CCM mental health counseling:25127} -{CCMPHARMDINTERVENTION:25122}  Asthma (Goal: control symptoms and prevent exacerbations) -{US controlled/uncontrolled:25276} -Current treatment  Montelukast 10 mg daily as needed  -Current treatment  Azelastine 0.1% nasal spray twice daily as needed  Fexofenadine 180 mg daily as needed  -Medications previously tried: ***  -Exacerbations requiring treatment in last 6 months: *** -Patient {Actions; denies-reports:120008} consistent use of maintenance inhaler -Frequency of rescue inhaler use: *** -Counseled on {CCMINHALERCOUNSELING:25121} -{CCMPHARMDINTERVENTION:25122}   Patient Goals/Self-Care Activities Patient will:  - {pharmacypatientgoals:24919}  Follow Up Plan: {CM FOLLOW UP YWVP:71062}

## 2021-04-07 ENCOUNTER — Ambulatory Visit: Payer: PPO | Admitting: Physical Therapy

## 2021-04-07 ENCOUNTER — Ambulatory Visit: Payer: Self-pay

## 2021-04-07 DIAGNOSIS — R296 Repeated falls: Secondary | ICD-10-CM

## 2021-04-07 DIAGNOSIS — M6281 Muscle weakness (generalized): Secondary | ICD-10-CM

## 2021-04-07 DIAGNOSIS — R2681 Unsteadiness on feet: Secondary | ICD-10-CM

## 2021-04-07 DIAGNOSIS — R42 Dizziness and giddiness: Secondary | ICD-10-CM

## 2021-04-07 DIAGNOSIS — M542 Cervicalgia: Secondary | ICD-10-CM

## 2021-04-07 NOTE — Therapy (Signed)
[facility] [address], [place], [address] Phone: [phone]   Fax:  [phone]  Physical Therapy Treatment  Patient Details  Name: Katie Buckley MRN: [id number] Date of Birth: 1950-10-02 Referring Provider (PT): Loleta Dicker, [place]   Encounter Date: 04/07/2021   PT End of Session - 04/07/21 1707     Visit Number 16    Number of Visits 76    Date for PT Re-Evaluation 06/28/21    Authorization Type [employer]: (Reporting period from 03/03/2021)    Progress Note Due on Visit 20    PT Start Time 1605    PT Stop Time 1645    PT Time Calculation (min) 40 min    Behavior During Therapy [facility] for tasks assessed/performed             Past Medical History:  Diagnosis Date   Anxiety    Aortic stenosis, mild    a.) mean gradient 18.7 mmHg on 10/2020 TTE   Depression    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   T2DM (type 2 diabetes mellitus) ([place])     Past Surgical History:  Procedure Laterality Date   ANTERIOR CERVICAL DECOMP/DISCECTOMY FUSION N/A 11/15/2020   Procedure: ANTERIOR CERVICAL DECOMPRESSION/DISCECTOMY FUSION 1 LEVEL C3/4;  Surgeon: Deetta Perla, MD;  Location: [facility] ORS;  Service: Neurosurgery;  Laterality: N/A;   AUGMENTATION MAMMAPLASTY Bilateral    breast implants   Bone Spur  2007   foot   CARDIAC CATHETERIZATION     Carpal Tunnel Symdrome  2008   as repeated in 2009   CATARACT EXTRACTION W/PHACO Left 01/16/2019   Procedure: CATARACT EXTRACTION PHACO AND INTRAOCULAR LENS PLACEMENT ([place]) LEFT DIABETES VISION BLUE;  Surgeon:  Marchia Meiers, MD;  Location: [facility] ORS;  Service: Ophthalmology;  Laterality: Left;  [place]  00:47 CDE 6.62 Fluid pack lot # [id number]   CATARACT EXTRACTION W/PHACO Right 02/06/2019   Procedure: CATARACT EXTRACTION PHACO AND INTRAOCULAR LENS PLACEMENT (IOC);  Surgeon: Marchia Meiers, MD;  Location: [facility] ORS;  Service: Ophthalmology;  Laterality: Right;  [place] 00:54.6 CDE 6.52 FLUID PACK LOT # [id number]   CHOLECYSTECTOMY  2007   COLONOSCOPY WITH PROPOFOL N/A 11/17/2015   Procedure: COLONOSCOPY WITH PROPOFOL;  Surgeon: Manya Silvas, MD;  Location: [facility];  Service: Endoscopy;  Laterality: N/A;   ESOPHAGOGASTRODUODENOSCOPY (EGD) WITH PROPOFOL N/A 11/17/2015   Procedure: ESOPHAGOGASTRODUODENOSCOPY (EGD) WITH PROPOFOL;  Surgeon: Manya Silvas, MD;  Location: [facility];  Service: Endoscopy;  Laterality: N/A;   EYE SURGERY Bilateral    cataract extraction   FOOT SURGERY     MANDIBLE SURGERY     NASAL SINUS SURGERY  03/2013   Multile surgery Dr. Carlis Abbott. 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 Tamala Julian   TONSILLECTOMY  1960    There were no vitals filed for this visit.   Subjective Assessment - 04/07/21 1607     Subjective Patient reports that physically she is doing well but mentally she  is feeling stressed out today. States she has just a little pain in the middle of her neck and a bit more to the left. Rates pain 2/10. States dizzines is happening a lot lately. She thinks it is related to her stress. She feels like she is trying to get so much done that she is "running around like crazy." She called her doctor's office because she gets up in the morning and feels okay and motivated but by about this time today she feels exhaused and really has to push herself to get things done. She wonders if she has a deficiency in something. States she felt a little run down like she did not do a good job after last PT session.    Pertinent History Patient is a 70  year old female who presents to outpatient physical therapy with a referral for s/p cervical spinal fusion (ANTERIOR CERVICAL DECOMPRESSION/DISCECTOMY FUSION 1 LEVEL C3/4, 11/15/2020), cervicalgia, multifactoral causes for imbalance, cervical myelopathy. This patient's chief complaints consist of imbalance, history of falls, neck pain, and neck hypomobility, leading to the following functional deficits: difficulty with walking, stairs, lifting items including groceries, balance, bending over, moving her neck, performing ADL's such as getting dressed, showering, bathing. Relevant past medical history and comorbidities include anxiety, aortic stenosis, depression, skin cancer, GERD, heart murmur, hyperchloremia, hypertension, neuropathy, skin cancer, type II diabetes mellitus, asthma, anxiety, left ventricular hypertrophy, diabetic neuropathy and per patient report unexplained weight loss (25-35lbs in past year) and a heart murmur. Patient denies history of stroke, seizure, lung problems, bowel or bladder changes, groin numbness or tingling.    Limitations Lifting;Walking;Standing;House hold activities;Other (comment)   walking, stairs, lifting items including groceries, balance, bending over, moving her neck, performing ADL's such as getting dressed, showering, bathing.   Patient Stated Goals "build up body strength and try to get imbalance under control"    Currently in Pain? Yes    Pain Score 2     Pain Onset More than a month ago             OBJECTIVE   VITALS BP 144/58 mmHg, seated, left arm, manual adult cuff. HR 67 bpm SpO2 98% Patient states she takes her BP medication in the morning now.    TREATMENT:    Manual therapy: to reduce pain and tissue tension, improve range of motion, neuromodulation, in order to promote improved ability to complete functional activities. HOOKLYING - STM to posterior cervical spine musculature focusing on bilateral upper traps, levator scap, cervical  paraspinals, suboccipital muscles.  Therapeutic exercise: to centralize symptoms and improve ROM, strength, muscular endurance, and activity tolerance required for successful completion of functional activities.  - education on breathing techniques for relaxation and improved breathing mechanics including handouts.   Neuromuscular Re-education: to improve, balance, postural strength, muscle activation patterns, and stabilization strength required for functional activities: - vitals check for safety (see above).  - standing (self-selected narrow stance) VORx1 exercise, 3x60 seconds firm surface (tried compliant but it was too difficult today).  - standing roll on the wall, 1x5 reps of 2 rolls each way, SBA for safety. Touchdown on wall for balance when needed. Keeping eyes open and stopping at each end to focus on a letter across the hall. Very difficult today.  - ambulation ~ 100 feet with SBA for safety from clinic to vehicle.    Pt required multimodal cuing for proper technique and to facilitate improved neuromuscular control, strength, range of motion, and functional ability resulting in improved performance.  Patient required Supervision - min A for safety.    PT Education - 04/07/21 1636     Education Details form/technique with exercise    Person(s) Educated Patient    Methods Explanation;Demonstration;Tactile cues;Verbal cues    Comprehension Verbalized understanding;Returned demonstration;Verbal cues required;Tactile cues required;Need further instruction              PT Short Term Goals - 02/17/21 1619       PT SHORT TERM GOAL #1   Title Patient will be independent with inital home exercise program to improve strength/mobility/balance and functional independence with ADLs.    Baseline HEP to be provided at 2nd visit    Time 3    Period Weeks    Status Achieved    Target Date 02/03/21               PT Long Term Goals - 04/05/21 1610       PT LONG TERM GOAL #1    Title Patient will be independent with long term home exercise program to improve strength/mobility/balance and functional independence with ADLs.    Baseline Patient reports HEP compliance (03/03/2021); continues to perform HEP (04/05/2021);    Time 12    Period Weeks    Status Partially Met   TARGET DATE FOR ALL LONG TERM GOALS: 04/06/2021. UNMET GOALS UPDATED TO: 06/28/2021     PT LONG TERM GOAL #2   Title Improve cervical AROM rotation to equal or greater than 60 degrees each direction to improve ability to check blind spot when driving, veiw surroundings, and allow patient to complete valued activities with less difficulty.    Baseline 30 L, 40 R (01/12/2021); 65 L, 71 R. Patient reports pain with L rotation (03/03/2021);  R = 63, L = 56 ipsilateral tension/neck pain (04/05/2021);    Time 12    Period Weeks    Status Partially Met      PT LONG TERM GOAL #3   Title Patient will score equal or greater than 25/30 on Functional Gait Assessment to demonstrate reduction in fall risk to low fall risk classification.    Baseline 11/30 (01/12/2021); 19/30 (03/03/2021); 22/30 (04/05/2021);    Time 12    Period Weeks    Status Partially Met      PT LONG TERM GOAL #4   Title Pt will increase LE strength of by at least 1/2 MMT grade in order to demonstrate improvement in strength, function, and general mobility    Baseline BIL hip flexion 3+, knee extension 4+ (01/13/2021); BIL hip flexion 3+, BIL knee extension 5/5 (03/03/2021); continues to improve with most motions - see objective exam (04/05/2021);    Time 12    Period Weeks    Status Partially Met      PT LONG TERM GOAL #5   Title Demonstrate improved FOTO score to 53 or greater to demonstrate improvement in overall condition and self-reported functional ability    Baseline 45 (01/10/2021); 48 (03/03/2021); 51 (04/05/2021);    Time 12    Period Weeks    Status Partially Met                   Plan - 04/07/21 1710     Clinical Impression  Statement Patient presents with decreased pain but more disequilibrium today that made it difficult for her to perform balance and vestibular exercises at the same difficulty as previously. Continued to work on improving neck pain and balance. Patient would benefit from continued  management of limiting condition by skilled physical therapist to address remaining impairments and functional limitations to work towards stated goals and return to PLOF or maximal functional independence.    Personal Factors and Comorbidities Comorbidity 3+;Past/Current Experience;Time since onset of injury/illness/exacerbation    Comorbidities anxiety, aortic stenosis, depression, skin cancer, GERD, heart murmur, hyperchloremia, hypertension, neuropathy, skin cancer, type II diabetes mellitus, asthma, anxiety, left ventricular hypertrophy, diabetic neuropathy and per patient report unexplained weight loss (25-35lbs in past year) and a heart murmur    Examination-Activity Limitations Bathing;Bend;Hygiene/Grooming;Dressing;Carry;Lift;Locomotion Level;Stairs;Squat;Stand;Transfers;Other   walking, stairs, lifting items including groceries, balance, bending over, moving her neck, performing ADL's such as getting dressed, showering, bathing.   Examination-Participation Restrictions Driving;Cleaning;Interpersonal Relationship;Yard Work;Community Activity;Laundry;Shop    Stability/Clinical Decision Making Unstable/Unpredictable    Rehab Potential Fair    PT Frequency 2x / week    PT Duration 12 weeks    PT Treatment/Interventions Cryotherapy;ADLs/Self Care Home Management;Moist Heat;Gait training;Stair training;Therapeutic activities;Functional mobility training;Therapeutic exercise;Balance training;Neuromuscular re-education;Patient/family education;Manual techniques;Passive range of motion;Vestibular;Dry needling;Biofeedback;Canalith Repostioning;DME Instruction    PT Next Visit Plan Progress vestibular, strength, and balance  activities as tolerated.    PT Home Exercise Plan Medbridge Access Code: [email or web address]    Consulted and Agree with Plan of Care Patient             Patient will benefit from skilled therapeutic intervention in order to improve the following deficits and impairments:  Impaired perceived functional ability, Abnormal gait, Decreased activity tolerance, Decreased range of motion, Decreased strength, Dizziness, Hypomobility, Impaired sensation, Pain, Decreased balance, Decreased mobility, Difficulty walking, Impaired UE functional use  Visit Diagnosis: Unsteadiness on feet  Repeated falls  Cervicalgia  Dizziness and giddiness  Muscle weakness (generalized)     Problem List Patient Active Problem List   Diagnosis Date Noted   Cervical myelopathy ([place]) 11/15/2020   LVH (left ventricular hypertrophy) 10/14/2020   COVID-19 05/19/2020   MRSA cellulitis 04/19/2018   Decreased libido 02/04/2018   Diabetes mellitus with neuropathy ([place]) 04/20/2017   H/O adenomatous polyp of colon 08/20/2015   Rupture of implant of right breast 05/18/2015   Anxiety disorder 04/26/2015   Allergic rhinitis 04/21/2015   Anemia 04/21/2015   Asthma 04/21/2015   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/21/2015   Chronic infection of sinus 04/21/2015   Depression 04/21/2015   GERD (gastroesophageal reflux disease) 04/21/2015   Headache 04/21/2015   Menopausal symptoms 04/21/2015   Obesity 04/21/2015   Palpitations 04/21/2015   Vitamin B12 deficiency 04/21/2015   Aortic stenosis 93/81/8299   Diastolic dysfunction 37/16/9678   Diabetes mellitus out of control 06/12/2006   Barrett esophagus 06/13/2003   Essential hypertension 06/12/1998   Genital herpes 06/12/1998   Hyperlipidemia, mixed 06/12/1998    Everlean Alstrom. Graylon Good, PT, DPT 04/07/21, 5:12 PM   [facility] [address] [place], [address] Phone: [phone]   Fax:  [phone]  Name:  Katie Buckley MRN: [id number] Date of Birth: [birth year]

## 2021-04-07 NOTE — Telephone Encounter (Signed)
Patient called and says she has no energy, fatigue, feeling tired for the past 2-3 weeks. She asked if she had a vitamin B-12 level done in August, trying to figure out why she is feeling like she does. I advised it was not done. She says the weakness is moderate. She says she started Lyrica about 1 week ago, but isn't sure if that is contributing to the fatigue. She reports SOB, but says she's being followed by cardiology for that and heart. Denies other symptoms. Advised she will need to be evaluated by provider, first available appointment is 1 week out, she agrees to the appointment, scheduled for Friday 04/15/21 at 1520, care advice given, patient verbalized understanding.   Message from Luciana Axe sent at 04/07/2021  3:36 PM EDT  Pt is calling to ask that the nurse was her B-12 low in her last lab work? Pt reports low energy and fatigue. Please advise     Reason for Disposition  [1] Fatigue (i.e., tires easily, decreased energy) AND [2] persists > 1 week  Answer Assessment - Initial Assessment Questions 1. DESCRIPTION: "Describe how you are feeling."     Fatigue and tired 2. SEVERITY: "How bad is it?"  "Can you stand and walk?"   - MILD - Feels weak or tired, but does not interfere with work, school or normal activities   - [place] to stand and walk; weakness interferes with work, school, or normal activities   - SEVERE - Unable to stand or walk     Moderate 3. ONSET:  "When did the weakness begin?"     Ongoing 2-3 weeks or more 4. CAUSE: "What do you think is causing the weakness?"     I don't know 5. MEDICINES: "Have you recently started a new medicine or had a change in the amount of a medicine?"     Off Gabapentin, started Lyrica 1 week ago 6. OTHER SYMPTOMS: "Do you have any other symptoms?" (e.g., chest pain, fever, cough, SOB, vomiting, diarrhea, bleeding, other areas of pain)     SOB (being evaluated by cardiologist), diarrhea comes and goes 7. PREGNANCY: "Is  there any chance you are pregnant?" "When was your last menstrual period?"     No  Protocols used: Weakness (Generalized) and Fatigue-A-AH

## 2021-04-11 ENCOUNTER — Ambulatory Visit: Payer: PPO | Admitting: Physical Therapy

## 2021-04-11 ENCOUNTER — Telehealth: Payer: Self-pay | Admitting: Physical Therapy

## 2021-04-11 NOTE — Telephone Encounter (Signed)
Called patient when she did not show up to her appointment scheduled today at 4pm. No answer. Unable to leave message.   Everlean Alstrom. Graylon Good, PT, DPT 04/11/21, 4:29 PM

## 2021-04-13 ENCOUNTER — Telehealth: Payer: Self-pay | Admitting: Family Medicine

## 2021-04-13 DIAGNOSIS — R0602 Shortness of breath: Secondary | ICD-10-CM | POA: Diagnosis not present

## 2021-04-13 DIAGNOSIS — R011 Cardiac murmur, unspecified: Secondary | ICD-10-CM | POA: Diagnosis not present

## 2021-04-13 NOTE — Telephone Encounter (Signed)
Tried calling patient. Left message to call back. OK for [facility] triage to speak with patient.  

## 2021-04-13 NOTE — Telephone Encounter (Signed)
I completed prior authorization forms for this [facility] for this patient last week, but haven't seen a response can you check with patient and see if she was able to get it filled?

## 2021-04-14 ENCOUNTER — Ambulatory Visit: Payer: PPO | Admitting: Physical Therapy

## 2021-04-14 DIAGNOSIS — F331 Major depressive disorder, recurrent, moderate: Secondary | ICD-10-CM | POA: Diagnosis not present

## 2021-04-14 DIAGNOSIS — F411 Generalized anxiety disorder: Secondary | ICD-10-CM | POA: Diagnosis not present

## 2021-04-15 ENCOUNTER — Ambulatory Visit: Payer: PPO | Admitting: Family Medicine

## 2021-04-20 ENCOUNTER — Encounter: Payer: Self-pay | Admitting: Physical Therapy

## 2021-04-20 ENCOUNTER — Other Ambulatory Visit: Payer: Self-pay

## 2021-04-20 ENCOUNTER — Ambulatory Visit: Payer: PPO | Attending: Neurosurgery | Admitting: Physical Therapy

## 2021-04-20 VITALS — BP 134/47 | HR 65

## 2021-04-20 DIAGNOSIS — R296 Repeated falls: Secondary | ICD-10-CM | POA: Diagnosis not present

## 2021-04-20 DIAGNOSIS — M6281 Muscle weakness (generalized): Secondary | ICD-10-CM | POA: Diagnosis not present

## 2021-04-20 DIAGNOSIS — M542 Cervicalgia: Secondary | ICD-10-CM | POA: Insufficient documentation

## 2021-04-20 DIAGNOSIS — R42 Dizziness and giddiness: Secondary | ICD-10-CM | POA: Insufficient documentation

## 2021-04-20 DIAGNOSIS — R2681 Unsteadiness on feet: Secondary | ICD-10-CM | POA: Diagnosis not present

## 2021-04-20 NOTE — Therapy (Signed)
[facility] [address], [place], [address] Phone: [phone]   Fax:  [phone]  Physical Therapy Treatment  Patient Details  Name: Katie Buckley MRN: [id number] Date of Birth: 18-Dec-1950 Referring Provider (PT): Loleta Dicker, [place]   Encounter Date: 04/20/2021   PT End of Session - 04/20/21 1835     Visit Number 17    Number of Visits 25    Date for PT Re-Evaluation 06/28/21    Authorization Type [employer]: (Reporting period from 03/03/2021)    Progress Note Due on Visit 20    PT Start Time 1817    PT Stop Time 1857    PT Time Calculation (min) 40 min    Activity Tolerance Patient tolerated treatment well    Behavior During Therapy [facility] for tasks assessed/performed             Past Medical History:  Diagnosis Date   Anxiety    Aortic stenosis, mild    a.) mean gradient 18.7 mmHg on 10/2020 TTE   Depression    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   T2DM (type 2 diabetes mellitus) ([place])     Past Surgical History:  Procedure Laterality Date   ANTERIOR CERVICAL DECOMP/DISCECTOMY FUSION N/A 11/15/2020   Procedure: ANTERIOR CERVICAL DECOMPRESSION/DISCECTOMY FUSION 1 LEVEL C3/4;  Surgeon: Deetta Perla, MD;  Location: [facility] ORS;  Service: Neurosurgery;  Laterality: N/A;   AUGMENTATION MAMMAPLASTY Bilateral    breast implants   Bone Spur  2007   foot   CARDIAC CATHETERIZATION     Carpal Tunnel Symdrome  2008   as repeated in 2009   CATARACT EXTRACTION W/PHACO Left 01/16/2019   Procedure: CATARACT EXTRACTION PHACO AND INTRAOCULAR  LENS PLACEMENT ([place]) LEFT DIABETES VISION BLUE;  Surgeon: Marchia Meiers, MD;  Location: [facility] ORS;  Service: Ophthalmology;  Laterality: Left;  [place]  00:47 CDE 6.62 Fluid pack lot # [id number]   CATARACT EXTRACTION W/PHACO Right 02/06/2019   Procedure: CATARACT EXTRACTION PHACO AND INTRAOCULAR LENS PLACEMENT (IOC);  Surgeon: Marchia Meiers, MD;  Location: [facility] ORS;  Service: Ophthalmology;  Laterality: Right;  [place] 00:54.6 CDE 6.52 FLUID PACK LOT # [id number]   CHOLECYSTECTOMY  2007   COLONOSCOPY WITH PROPOFOL N/A 11/17/2015   Procedure: COLONOSCOPY WITH PROPOFOL;  Surgeon: Manya Silvas, MD;  Location: [facility] ENDOSCOPY;  Service: Endoscopy;  Laterality: N/A;   ESOPHAGOGASTRODUODENOSCOPY (EGD) WITH PROPOFOL N/A 11/17/2015   Procedure: ESOPHAGOGASTRODUODENOSCOPY (EGD) WITH PROPOFOL;  Surgeon: Manya Silvas, MD;  Location: [facility] ENDOSCOPY;  Service: Endoscopy;  Laterality: N/A;   EYE SURGERY Bilateral    cataract extraction   FOOT SURGERY     MANDIBLE SURGERY     NASAL SINUS SURGERY  03/2013   Multile surgery Dr. Carlis Abbott. 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 Tamala Julian   TONSILLECTOMY  1960    Vitals:   04/20/21 1819  BP: (!) 134/47  Pulse: 65  SpO2: 99%     Subjective Assessment -  04/20/21 1819     Subjective Patient reports she has 4/10 pain in the middle of her neck and radiates both sides, not in her head. She was in bed all day monday and missed her PT appointment because she never comes to PT on mondays and forgot she had an appointment. She does not remember any updates to her health. Reports her balance has been okay recently, about the same. She is not having too much of a problem with dizziness but she does not feel as stable as she would like to.    Pertinent History Patient is a 70 year old female who presents to outpatient physical therapy with a referral for s/p cervical spinal fusion (ANTERIOR CERVICAL DECOMPRESSION/DISCECTOMY FUSION 1 LEVEL  C3/4, 11/15/2020), cervicalgia, multifactoral causes for imbalance, cervical myelopathy. This patient's chief complaints consist of imbalance, history of falls, neck pain, and neck hypomobility, leading to the following functional deficits: difficulty with walking, stairs, lifting items including groceries, balance, bending over, moving her neck, performing ADL's such as getting dressed, showering, bathing. Relevant past medical history and comorbidities include anxiety, aortic stenosis, depression, skin cancer, GERD, heart murmur, hyperchloremia, hypertension, neuropathy, skin cancer, type II diabetes mellitus, asthma, anxiety, left ventricular hypertrophy, diabetic neuropathy and per patient report unexplained weight loss (25-35lbs in past year) and a heart murmur. Patient denies history of stroke, seizure, lung problems, bowel or bladder changes, groin numbness or tingling.    Limitations Lifting;Walking;Standing;House hold activities;Other (comment)   walking, stairs, lifting items including groceries, balance, bending over, moving her neck, performing ADL's such as getting dressed, showering, bathing.   Patient Stated Goals "build up body strength and try to get imbalance under control"    Currently in Pain? Yes    Pain Score 4     Pain Onset More than a month ago              OBJECTIVE   VITALS BP 134/47 mmHg, seated, left arm, automatic adult cuff. HR 65 bpm SpO2 99%   TREATMENT:    Manual therapy: to reduce pain and tissue tension, improve range of motion, neuromodulation, in order to promote improved ability to complete functional activities. HOOKLYING - STM to posterior cervical spine musculature focusing on bilateral upper traps, levator scap, cervical paraspinals, suboccipital muscles.   Neuromuscular Re-education: to improve, balance, postural strength, muscle activation patterns, and stabilization strength required for functional activities: - vitals check for safety (see  above).  - standing (self-selected stance) VORx1 exercise, 1x60 seconds firm surface, 2x60 seconds compliant surface - standing roll on the wall, 1x5 reps of 2 rolls each way, SBA for safety. Touchdown on wall for balance when needed. Keeping eyes open and stopping at each end to focus on a letter across the hall.  - walking over ground with VORx1 plain background, 4x30 feet - walking over ground with VORx1 on busy background, 4x30 feet.  - standing alternating double toe taps on cone, 2x10 each side, CGA for safety.  - balance at rebounder tossing pink theraball against it: 1x10 self selected stance firm surface, 1x20 self selected stance on compliant surface with CGA for safety.   Pt required multimodal cuing for proper technique and to facilitate improved neuromuscular control, strength, range of motion, and functional ability resulting in improved performance. Patient required Supervision - min A for safety.    PT Education - 04/20/21 1823     Education Details form/technique with exercise    Person(s) Educated Patient    Methods Explanation;Demonstration;Tactile cues;Verbal  cues    Comprehension Verbalized understanding;Returned demonstration;Verbal cues required;Tactile cues required;Need further instruction              PT Short Term Goals - 02/17/21 1619       PT SHORT TERM GOAL #1   Title Patient will be independent with inital home exercise program to improve strength/mobility/balance and functional independence with ADLs.    Baseline HEP to be provided at 2nd visit    Time 3    Period Weeks    Status Achieved    Target Date 02/03/21               PT Long Term Goals - 04/05/21 1610       PT LONG TERM GOAL #1   Title Patient will be independent with long term home exercise program to improve strength/mobility/balance and functional independence with ADLs.    Baseline Patient reports HEP compliance (03/03/2021); continues to perform HEP (04/05/2021);    Time 12     Period Weeks    Status Partially Met   TARGET DATE FOR ALL LONG TERM GOALS: 04/06/2021. UNMET GOALS UPDATED TO: 06/28/2021     PT LONG TERM GOAL #2   Title Improve cervical AROM rotation to equal or greater than 60 degrees each direction to improve ability to check blind spot when driving, veiw surroundings, and allow patient to complete valued activities with less difficulty.    Baseline 30 L, 40 R (01/12/2021); 65 L, 71 R. Patient reports pain with L rotation (03/03/2021);  R = 63, L = 56 ipsilateral tension/neck pain (04/05/2021);    Time 12    Period Weeks    Status Partially Met      PT LONG TERM GOAL #3   Title Patient will score equal or greater than 25/30 on Functional Gait Assessment to demonstrate reduction in fall risk to low fall risk classification.    Baseline 11/30 (01/12/2021); 19/30 (03/03/2021); 22/30 (04/05/2021);    Time 12    Period Weeks    Status Partially Met      PT LONG TERM GOAL #4   Title Pt will increase LE strength of by at least 1/2 MMT grade in order to demonstrate improvement in strength, function, and general mobility    Baseline BIL hip flexion 3+, knee extension 4+ (01/13/2021); BIL hip flexion 3+, BIL knee extension 5/5 (03/03/2021); continues to improve with most motions - see objective exam (04/05/2021);    Time 12    Period Weeks    Status Partially Met      PT LONG TERM GOAL #5   Title Demonstrate improved FOTO score to 53 or greater to demonstrate improvement in overall condition and self-reported functional ability    Baseline 45 (01/10/2021); 48 (03/03/2021); 51 (04/05/2021);    Time 12    Period Weeks    Status Partially Met                   Plan - 04/20/21 2000     Clinical Impression Statement Patient tolerated treatment well overall with report of decreased pain after manual therapy. Patient demonstrated improvement in vestibular function today compared to last PT session and was able to do more difficulty balance activities. Patient  continues to have increased fall risk and difficulty with functional activities due to neck pain and balance deficits. Patient would benefit from continued management of limiting condition by skilled physical therapist to address remaining impairments and functional limitations to work towards stated goals  and return to PLOF or maximal functional independence.    Personal Factors and Comorbidities Comorbidity 3+;Past/Current Experience;Time since onset of injury/illness/exacerbation    Comorbidities anxiety, aortic stenosis, depression, skin cancer, GERD, heart murmur, hyperchloremia, hypertension, neuropathy, skin cancer, type II diabetes mellitus, asthma, anxiety, left ventricular hypertrophy, diabetic neuropathy and per patient report unexplained weight loss (25-35lbs in past year) and a heart murmur    Examination-Activity Limitations Bathing;Bend;Hygiene/Grooming;Dressing;Carry;Lift;Locomotion Level;Stairs;Squat;Stand;Transfers;Other   walking, stairs, lifting items including groceries, balance, bending over, moving her neck, performing ADL's such as getting dressed, showering, bathing.   Examination-Participation Restrictions Driving;Cleaning;Interpersonal Relationship;Yard Work;Community Activity;Laundry;Shop    Stability/Clinical Decision Making Unstable/Unpredictable    Rehab Potential Fair    PT Frequency 2x / week    PT Duration 12 weeks    PT Treatment/Interventions Cryotherapy;ADLs/Self Care Home Management;Moist Heat;Gait training;Stair training;Therapeutic activities;Functional mobility training;Therapeutic exercise;Balance training;Neuromuscular re-education;Patient/family education;Manual techniques;Passive range of motion;Vestibular;Dry needling;Biofeedback;Canalith Repostioning;DME Instruction    PT Next Visit Plan Progress vestibular, strength, and balance activities as tolerated.    PT Home Exercise Plan Medbridge Access Code: [email or web address]    Consulted and Agree with Plan of Care Patient              Patient will benefit from skilled therapeutic intervention in order to improve the following deficits and impairments:  Impaired perceived functional ability, Abnormal gait, Decreased activity tolerance, Decreased range of motion, Decreased strength, Dizziness, Hypomobility, Impaired sensation, Pain, Decreased balance, Decreased mobility, Difficulty walking, Impaired UE functional use  Visit Diagnosis: Unsteadiness on feet  Repeated falls  Cervicalgia  Dizziness and giddiness  Muscle weakness (generalized)     Problem List Patient Active Problem List   Diagnosis Date Noted   Cervical myelopathy ([place]) 11/15/2020   LVH (left ventricular hypertrophy) 10/14/2020   COVID-19 05/19/2020   MRSA cellulitis 04/19/2018   Decreased libido 02/04/2018   Diabetes mellitus with neuropathy ([place]) 04/20/2017   H/O adenomatous polyp of colon 08/20/2015   Rupture of implant of right breast 05/18/2015   Anxiety disorder 04/26/2015   Allergic rhinitis 04/21/2015   Anemia 04/21/2015   Asthma 04/21/2015   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/21/2015   Chronic infection of sinus 04/21/2015   Depression 04/21/2015   GERD (gastroesophageal reflux disease) 04/21/2015   Headache 04/21/2015   Menopausal symptoms 04/21/2015   Obesity 04/21/2015   Palpitations 04/21/2015   Vitamin B12 deficiency 04/21/2015   Aortic stenosis 08/81/1031   Diastolic dysfunction 59/45/8592   Diabetes mellitus out of control 06/12/2006   Barrett esophagus 06/13/2003   Essential hypertension 06/12/1998   Genital herpes 06/12/1998   Hyperlipidemia, mixed 06/12/1998    Everlean Alstrom. Graylon Good, PT, DPT 04/20/21, 8:00 PM   [facility] [address] [place], [address] Phone: [phone]   Fax:  [phone]  Name: Katie Buckley MRN: [id number] Date of Birth: Feb 08, 1951

## 2021-04-21 DIAGNOSIS — F411 Generalized anxiety disorder: Secondary | ICD-10-CM | POA: Diagnosis not present

## 2021-04-21 DIAGNOSIS — R41840 Attention and concentration deficit: Secondary | ICD-10-CM | POA: Diagnosis not present

## 2021-04-21 DIAGNOSIS — F331 Major depressive disorder, recurrent, moderate: Secondary | ICD-10-CM | POA: Diagnosis not present

## 2021-04-21 DIAGNOSIS — G4700 Insomnia, unspecified: Secondary | ICD-10-CM | POA: Diagnosis not present

## 2021-04-25 ENCOUNTER — Other Ambulatory Visit: Payer: Self-pay | Admitting: Family Medicine

## 2021-04-25 DIAGNOSIS — R0989 Other specified symptoms and signs involving the circulatory and respiratory systems: Secondary | ICD-10-CM | POA: Diagnosis not present

## 2021-04-25 DIAGNOSIS — E782 Mixed hyperlipidemia: Secondary | ICD-10-CM | POA: Diagnosis not present

## 2021-04-25 DIAGNOSIS — R0602 Shortness of breath: Secondary | ICD-10-CM | POA: Diagnosis not present

## 2021-04-25 DIAGNOSIS — R011 Cardiac murmur, unspecified: Secondary | ICD-10-CM | POA: Diagnosis not present

## 2021-04-25 DIAGNOSIS — E119 Type 2 diabetes mellitus without complications: Secondary | ICD-10-CM | POA: Diagnosis not present

## 2021-04-25 DIAGNOSIS — I10 Essential (primary) hypertension: Secondary | ICD-10-CM | POA: Diagnosis not present

## 2021-04-25 NOTE — Telephone Encounter (Signed)
Medication Refill - Medication:  tirzepatide Darcel [place]) 5 MG/0.5ML Pen   Has the patient contacted their pharmacy? Yes.   Contact PCP- Pt stated PCP was going to up the dosage and pt wanted to know if that updated refill can be sent over to the pharmacy*  Preferred Pharmacy (with phone number or street name):  [id number] [address], [place] - [address]  [place], [place] [address]  Phone:  [phone]  Fax:  [phone]   Has the patient been seen for an appointment in the last year OR does the patient have an upcoming appointment? Yes.    Agent: Please be advised that RX refills may take up to 3 business days. We ask that you follow-up with your pharmacy.

## 2021-04-25 NOTE — Telephone Encounter (Signed)
Requested medications are due for refill today yes  Requested medications are on the active medication list yes  Last visit 03/25/21  Future visit scheduled 05/30/21  Notes to clinic There is not a protocol to follow for this med, please assess. Requested Prescriptions  Pending Prescriptions Disp Refills   tirzepatide (MOUNJARO) 5 MG/0.5ML Pen 2 mL 0    Sig: Inject 5 mg into the skin once a week.     There is no refill protocol information for this order

## 2021-04-26 ENCOUNTER — Encounter: Payer: Self-pay | Admitting: Physical Therapy

## 2021-04-26 ENCOUNTER — Ambulatory Visit: Payer: PPO | Admitting: Physical Therapy

## 2021-04-26 DIAGNOSIS — R2681 Unsteadiness on feet: Secondary | ICD-10-CM | POA: Diagnosis not present

## 2021-04-26 DIAGNOSIS — R42 Dizziness and giddiness: Secondary | ICD-10-CM

## 2021-04-26 DIAGNOSIS — R296 Repeated falls: Secondary | ICD-10-CM

## 2021-04-26 DIAGNOSIS — M6281 Muscle weakness (generalized): Secondary | ICD-10-CM

## 2021-04-26 DIAGNOSIS — M542 Cervicalgia: Secondary | ICD-10-CM

## 2021-04-26 MED ORDER — TIRZEPATIDE 5 MG/0.5ML ~~LOC~~ SOAJ: 5.0000 mg | SUBCUTANEOUS | 0 refills | Status: DC

## 2021-04-26 NOTE — Therapy (Signed)
[facility] [address], [place], [address] Phone: [phone]   Fax:  [phone]  Physical Therapy Treatment  Patient Details  Name: Katie Buckley MRN: [id number] Date of Birth: [birth year] Referring Provider (PT): Loleta Dicker, [place]   Encounter Date: 04/26/2021   PT End of Session - 04/26/21 1710     Visit Number 18    Number of Visits 60    Date for PT Re-Evaluation 06/28/21    Authorization Type [employer]: (Reporting period from 03/03/2021)    Progress Note Due on Visit 20    PT Start Time 1652    PT Stop Time 1730    PT Time Calculation (min) 38 min    Activity Tolerance Patient tolerated treatment well    Behavior During Therapy [facility] for tasks assessed/performed             Past Medical History:  Diagnosis Date   Anxiety    Aortic stenosis, mild    a.) mean gradient 18.7 mmHg on 10/2020 TTE   Depression    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   T2DM (type 2 diabetes mellitus) ([place])     Past Surgical History:  Procedure Laterality Date   ANTERIOR CERVICAL DECOMP/DISCECTOMY FUSION N/A 11/15/2020   Procedure: ANTERIOR CERVICAL DECOMPRESSION/DISCECTOMY FUSION 1 LEVEL C3/4;  Surgeon: Deetta Perla, MD;  Location: [facility] ORS;  Service: Neurosurgery;  Laterality: N/A;   AUGMENTATION MAMMAPLASTY Bilateral    breast implants   Bone Spur  2007   foot   CARDIAC CATHETERIZATION     Carpal Tunnel Symdrome  2008   as repeated in 2009   CATARACT EXTRACTION W/PHACO Left 01/16/2019   Procedure: CATARACT EXTRACTION PHACO AND INTRAOCULAR  LENS PLACEMENT ([place]) LEFT DIABETES VISION BLUE;  Surgeon: Marchia Meiers, MD;  Location: [facility] ORS;  Service: Ophthalmology;  Laterality: Left;  [place]  00:47 CDE 6.62 Fluid pack lot # [id number]   CATARACT EXTRACTION W/PHACO Right 02/06/2019   Procedure: CATARACT EXTRACTION PHACO AND INTRAOCULAR LENS PLACEMENT (IOC);  Surgeon: Marchia Meiers, MD;  Location: [facility] ORS;  Service: Ophthalmology;  Laterality: Right;  [place] 00:54.6 CDE 6.52 FLUID PACK LOT # [id number]   CHOLECYSTECTOMY  2007   COLONOSCOPY WITH PROPOFOL N/A 11/17/2015   Procedure: COLONOSCOPY WITH PROPOFOL;  Surgeon: Manya Silvas, MD;  Location: [facility] ENDOSCOPY;  Service: Endoscopy;  Laterality: N/A;   ESOPHAGOGASTRODUODENOSCOPY (EGD) WITH PROPOFOL N/A 11/17/2015   Procedure: ESOPHAGOGASTRODUODENOSCOPY (EGD) WITH PROPOFOL;  Surgeon: Manya Silvas, MD;  Location: [facility] ENDOSCOPY;  Service: Endoscopy;  Laterality: N/A;   EYE SURGERY Bilateral    cataract extraction   FOOT SURGERY     MANDIBLE SURGERY     NASAL SINUS SURGERY  03/2013   Multile surgery Dr. Carlis Abbott. 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 Tamala Julian   TONSILLECTOMY  1960    There were no vitals filed for this visit.   Subjective Assessment - 04/26/21 1656     Subjective Patient reports  her stress levels have been a lot better. She states her neck and upper back is tense upon arrival. States she thinks she hurt her right wrist helping her husband up yesterday. She went for her cardiac testing yesterday and that went okay but she does not have results yet. She sees Dr. Clayborn Bigness week. Reports her balance has been better with just a couple of stumbles. Current pain 3-4/10 in neck.    Pertinent History Patient is a 70 year old female who presents to outpatient physical therapy with a referral for s/p cervical spinal fusion (ANTERIOR CERVICAL DECOMPRESSION/DISCECTOMY FUSION 1 LEVEL C3/4, 11/15/2020), cervicalgia, multifactoral causes for imbalance,  cervical myelopathy. This patient's chief complaints consist of imbalance, history of falls, neck pain, and neck hypomobility, leading to the following functional deficits: difficulty with walking, stairs, lifting items including groceries, balance, bending over, moving her neck, performing ADL's such as getting dressed, showering, bathing. Relevant past medical history and comorbidities include anxiety, aortic stenosis, depression, skin cancer, GERD, heart murmur, hyperchloremia, hypertension, neuropathy, skin cancer, type II diabetes mellitus, asthma, anxiety, left ventricular hypertrophy, diabetic neuropathy and per patient report unexplained weight loss (25-35lbs in past year) and a heart murmur. Patient denies history of stroke, seizure, lung problems, bowel or bladder changes, groin numbness or tingling.    Limitations Lifting;Walking;Standing;House hold activities;Other (comment)   walking, stairs, lifting items including groceries, balance, bending over, moving her neck, performing ADL's such as getting dressed, showering, bathing.   Patient Stated Goals "build up body strength and try to get imbalance under control"    Currently in Pain? Yes    Pain Score 4     Pain Onset More than a month ago             OBJECTIVE    TREATMENT:    Manual therapy: to reduce pain and tissue tension, improve range of motion, neuromodulation, in order to promote improved ability to complete functional activities. HOOKLYING - STM to posterior cervical spine musculature focusing on bilateral upper traps, levator scap, cervical paraspinals, suboccipital muscles.   Neuromuscular Re-education: to improve, balance, postural strength, muscle activation patterns, and stabilization strength required for functional activities: - standing (self-selected stance) VORx1 exercise, 1x60 seconds firm surface, 2x60 seconds compliant surface - standing roll on the wall, 1x5 reps of 2 rolls each way, SBA for safety.  Touchdown on wall for balance when needed. Keeping eyes open and stopping at each end to focus on a letter across the hall.  - walking over ground with VORx1 plain background, 4x30 feet - walking over ground with VORx1 on busy background, 4x30 feet.  - standing alternating double toe taps on cone, 2x10 each side, CGA for safety.    Pt required multimodal cuing for proper technique and to facilitate improved neuromuscular control, strength, range of motion, and functional ability resulting in improved performance. Patient required Supervision - min A for safety.     PT Education - 04/26/21 1710     Education Details form/technique with exercise    Person(s) Educated Patient    Methods Explanation;Demonstration;Tactile cues;Verbal cues    Comprehension Verbalized understanding;Returned demonstration;Verbal cues required;Tactile cues required;Need further instruction              PT Short Term Goals - 02/17/21 1619       PT SHORT TERM GOAL #1   Title Patient will be independent with inital home exercise program to improve strength/mobility/balance and functional independence with ADLs.    Baseline HEP to be provided  at 2nd visit    Time 3    Period Weeks    Status Achieved    Target Date 02/03/21               PT Long Term Goals - 04/05/21 1610       PT LONG TERM GOAL #1   Title Patient will be independent with long term home exercise program to improve strength/mobility/balance and functional independence with ADLs.    Baseline Patient reports HEP compliance (03/03/2021); continues to perform HEP (04/05/2021);    Time 12    Period Weeks    Status Partially Met   TARGET DATE FOR ALL LONG TERM GOALS: 04/06/2021. UNMET GOALS UPDATED TO: 06/28/2021     PT LONG TERM GOAL #2   Title Improve cervical AROM rotation to equal or greater than 60 degrees each direction to improve ability to check blind spot when driving, veiw surroundings, and allow patient to complete valued  activities with less difficulty.    Baseline 30 L, 40 R (01/12/2021); 65 L, 71 R. Patient reports pain with L rotation (03/03/2021);  R = 63, L = 56 ipsilateral tension/neck pain (04/05/2021);    Time 12    Period Weeks    Status Partially Met      PT LONG TERM GOAL #3   Title Patient will score equal or greater than 25/30 on Functional Gait Assessment to demonstrate reduction in fall risk to low fall risk classification.    Baseline 11/30 (01/12/2021); 19/30 (03/03/2021); 22/30 (04/05/2021);    Time 12    Period Weeks    Status Partially Met      PT LONG TERM GOAL #4   Title Pt will increase LE strength of by at least 1/2 MMT grade in order to demonstrate improvement in strength, function, and general mobility    Baseline BIL hip flexion 3+, knee extension 4+ (01/13/2021); BIL hip flexion 3+, BIL knee extension 5/5 (03/03/2021); continues to improve with most motions - see objective exam (04/05/2021);    Time 12    Period Weeks    Status Partially Met      PT LONG TERM GOAL #5   Title Demonstrate improved FOTO score to 53 or greater to demonstrate improvement in overall condition and self-reported functional ability    Baseline 45 (01/10/2021); 48 (03/03/2021); 51 (04/05/2021);    Time 12    Period Weeks    Status Partially Met                   Plan - 04/26/21 1824     Clinical Impression Statement Patient appears to have maintained improvements since last PT session and was able to continue practicing vestibular and balance activities at similar level as last session. Patient reports improved pain with manual therapy. Patient would benefit from continued balance, vestibular, pain control, and postural strengthening interventions. Patient would benefit from continued management of limiting condition by skilled physical therapist to address remaining impairments and functional limitations to work towards stated goals and return to PLOF or maximal functional independence.    Personal  Factors and Comorbidities Comorbidity 3+;Past/Current Experience;Time since onset of injury/illness/exacerbation    Comorbidities anxiety, aortic stenosis, depression, skin cancer, GERD, heart murmur, hyperchloremia, hypertension, neuropathy, skin cancer, type II diabetes mellitus, asthma, anxiety, left ventricular hypertrophy, diabetic neuropathy and per patient report unexplained weight loss (25-35lbs in past year) and a heart murmur    Examination-Activity Limitations Bathing;Bend;Hygiene/Grooming;Dressing;Carry;Lift;Locomotion Level;Stairs;Squat;Stand;Transfers;Other   walking, stairs, lifting items including  groceries, balance, bending over, moving her neck, performing ADL's such as getting dressed, showering, bathing.   Examination-Participation Restrictions Driving;Cleaning;Interpersonal Relationship;Yard Work;Community Activity;Laundry;Shop    Stability/Clinical Decision Making Unstable/Unpredictable    Rehab Potential Fair    PT Frequency 2x / week    PT Duration 12 weeks    PT Treatment/Interventions Cryotherapy;ADLs/Self Care Home Management;Moist Heat;Gait training;Stair training;Therapeutic activities;Functional mobility training;Therapeutic exercise;Balance training;Neuromuscular re-education;Patient/family education;Manual techniques;Passive range of motion;Vestibular;Dry needling;Biofeedback;Canalith Repostioning;DME Instruction    PT Next Visit Plan Progress vestibular, strength, and balance activities as tolerated.    PT Home Exercise Plan Medbridge Access Code: [email or web address]    Consulted and Agree with Plan of Care Patient             Patient will benefit from skilled therapeutic intervention in order to improve the following deficits and impairments:  Impaired perceived functional ability, Abnormal gait, Decreased activity tolerance, Decreased range of motion, Decreased strength, Dizziness, Hypomobility, Impaired sensation, Pain, Decreased balance, Decreased mobility, Difficulty  walking, Impaired UE functional use  Visit Diagnosis: Unsteadiness on feet  Repeated falls  Cervicalgia  Dizziness and giddiness  Muscle weakness (generalized)     Problem List Patient Active Problem List   Diagnosis Date Noted   Cervical myelopathy ([place]) 11/15/2020   LVH (left ventricular hypertrophy) 10/14/2020   COVID-19 05/19/2020   MRSA cellulitis 04/19/2018   Decreased libido 02/04/2018   Diabetes mellitus with neuropathy ([place]) 04/20/2017   H/O adenomatous polyp of colon 08/20/2015   Rupture of implant of right breast 05/18/2015   Anxiety disorder 04/26/2015   Allergic rhinitis 04/21/2015   Anemia 04/21/2015   Asthma 04/21/2015   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/21/2015   Chronic infection of sinus 04/21/2015   Depression 04/21/2015   GERD (gastroesophageal reflux disease) 04/21/2015   Headache 04/21/2015   Menopausal symptoms 04/21/2015   Obesity 04/21/2015   Palpitations 04/21/2015   Vitamin B12 deficiency 04/21/2015   Aortic stenosis 19/50/9326   Diastolic dysfunction 71/24/5809   Diabetes mellitus out of control 06/12/2006   Barrett esophagus 06/13/2003   Essential hypertension 06/12/1998   Genital herpes 06/12/1998   Hyperlipidemia, mixed 06/12/1998    Everlean Alstrom. Graylon Good, PT, DPT 04/26/21, 6:26 PM   [facility] [address] [place], [address] Phone: [phone]   Fax:  [phone]  Name: ROSEZETTA BALDERSTON MRN: [id number] Date of Birth: 18-Apr-1951

## 2021-04-26 NOTE — Therapy (Signed)
[facility] [address], [place], [address] Phone: [phone]   Fax:  [phone]  Physical Therapy Treatment  Patient Details  Name: Katie Buckley MRN: [id number] Date of Birth: September 05, 1950 Referring Provider (PT): Loleta Dicker, [place]   Encounter Date: 04/26/2021    Past Medical History:  Diagnosis Date   Anxiety    Aortic stenosis, mild    a.) mean gradient 18.7 mmHg on 10/2020 TTE   Depression    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   T2DM (type 2 diabetes mellitus) ([place])     Past Surgical History:  Procedure Laterality Date   ANTERIOR CERVICAL DECOMP/DISCECTOMY FUSION N/A 11/15/2020   Procedure: ANTERIOR CERVICAL DECOMPRESSION/DISCECTOMY FUSION 1 LEVEL C3/4;  Surgeon: Deetta Perla, MD;  Location: [facility] ORS;  Service: Neurosurgery;  Laterality: N/A;   AUGMENTATION MAMMAPLASTY Bilateral    breast implants   Bone Spur  2007   foot   CARDIAC CATHETERIZATION     Carpal Tunnel Symdrome  2008   as repeated in 2009   CATARACT EXTRACTION W/PHACO Left 01/16/2019   Procedure: CATARACT EXTRACTION PHACO AND INTRAOCULAR LENS PLACEMENT ([place]) LEFT DIABETES VISION BLUE;  Surgeon: Marchia Meiers, MD;  Location: [facility] ORS;  Service: Ophthalmology;  Laterality: Left;  [place]  00:47 CDE 6.62 Fluid pack lot # [id number]   CATARACT EXTRACTION W/PHACO Right 02/06/2019   Procedure: CATARACT EXTRACTION PHACO AND INTRAOCULAR LENS PLACEMENT (IOC);  Surgeon: Marchia Meiers, MD;  Location: [facility] ORS;  Service: Ophthalmology;  Laterality: Right;  [place] 00:54.6 CDE 6.52 FLUID PACK LOT #  [id number]   CHOLECYSTECTOMY  2007   COLONOSCOPY WITH PROPOFOL N/A 11/17/2015   Procedure: COLONOSCOPY WITH PROPOFOL;  Surgeon: Manya Silvas, MD;  Location[facility] ENDOSCOPY;  Service: Endoscopy;  Laterality: N/A;   ESOPHAGOGASTRODUODENOSCOPY (EGD) WITH PROPOFOL N/A 11/17/2015   Procedure: ESOPHAGOGASTRODUODENOSCOPY (EGD) WITH PROPOFOL;  Surgeon: Manya Silvas, MD;  Location: [facility] ENDOSCOPY;  Service: Endoscopy;  Laterality: N/A;   EYE SURGERY Bilateral    cataract extraction   FOOT SURGERY     MANDIBLE SURGERY     NASAL SINUS SURGERY  03/2013   Multile surgery Dr. Carlis Abbott. DX eosinophilic sinusitis [id number]   Release of Trigger Finger Right 10/2010   Dr. Tamala Julian   TONSILLECTOMY  1960    There were no vitals filed for this visit.                                 PT Short Term Goals - 02/17/21 1619       PT SHORT TERM GOAL #1   Title Patient will be independent with inital home exercise program to improve strength/mobility/balance and functional independence with ADLs.    Baseline HEP to be provided at 2nd visit    Time 3    Period Weeks    Status Achieved    Target Date 02/03/21  PT Long Term Goals - 04/05/21 1610       PT LONG TERM GOAL #1   Title Patient will be independent with long term home exercise program to improve strength/mobility/balance and functional independence with ADLs.    Baseline Patient reports HEP compliance (03/03/2021); continues to perform HEP (04/05/2021);    Time 12    Period Weeks    Status Partially Met   TARGET DATE FOR ALL LONG TERM GOALS: 04/06/2021. UNMET GOALS UPDATED TO: 06/28/2021     PT LONG TERM GOAL #2   Title Improve cervical AROM rotation to equal or greater than 60 degrees each direction to improve ability to check blind spot when driving, veiw surroundings, and allow patient to complete valued activities with less difficulty.    Baseline 30 L, 40 R (01/12/2021); 65 L, 71 R. Patient reports pain  with L rotation (03/03/2021);  R = 63, L = 56 ipsilateral tension/neck pain (04/05/2021);    Time 12    Period Weeks    Status Partially Met      PT LONG TERM GOAL #3   Title Patient will score equal or greater than 25/30 on Functional Gait Assessment to demonstrate reduction in fall risk to low fall risk classification.    Baseline 11/30 (01/12/2021); 19/30 (03/03/2021); 22/30 (04/05/2021);    Time 12    Period Weeks    Status Partially Met      PT LONG TERM GOAL #4   Title Pt will increase LE strength of by at least 1/2 MMT grade in order to demonstrate improvement in strength, function, and general mobility    Baseline BIL hip flexion 3+, knee extension 4+ (01/13/2021); BIL hip flexion 3+, BIL knee extension 5/5 (03/03/2021); continues to improve with most motions - see objective exam (04/05/2021);    Time 12    Period Weeks    Status Partially Met      PT LONG TERM GOAL #5   Title Demonstrate improved FOTO score to 53 or greater to demonstrate improvement in overall condition and self-reported functional ability    Baseline 45 (01/10/2021); 48 (03/03/2021); 51 (04/05/2021);    Time 12    Period Weeks    Status Partially Met                    Patient will benefit from skilled therapeutic intervention in order to improve the following deficits and impairments:     Visit Diagnosis: No diagnosis found.     Problem List Patient Active Problem List   Diagnosis Date Noted   Cervical myelopathy ([place]) 11/15/2020   LVH (left ventricular hypertrophy) 10/14/2020   COVID-19 05/19/2020   MRSA cellulitis 04/19/2018   Decreased libido 02/04/2018   Diabetes mellitus with neuropathy ([place]) 04/20/2017   H/O adenomatous polyp of colon 08/20/2015   Rupture of implant of right breast 05/18/2015   Anxiety disorder 04/26/2015   Allergic rhinitis 04/21/2015   Anemia 04/21/2015   Asthma 04/21/2015   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/21/2015   Chronic infection of sinus 04/21/2015   Depression  04/21/2015   GERD (gastroesophageal reflux disease) 04/21/2015   Headache 04/21/2015   Menopausal symptoms 04/21/2015   Obesity 04/21/2015   Palpitations 04/21/2015   Vitamin B12 deficiency 04/21/2015   Aortic stenosis 97/07/6376   Diastolic dysfunction 58/85/0277   Diabetes mellitus out of control 06/12/2006   Barrett esophagus 06/13/2003   Essential hypertension 06/12/1998   Genital herpes 06/12/1998   Hyperlipidemia, mixed 06/12/1998    Clarise Cruz  Carin Primrose, PT 04/26/2021, 4:55 PM  [facility] [address] [place], [address] Phone: [phone]   Fax:  [phone]  Name: LORIANNA SPADACCINI MRN: [id number] Date of Birth: 1951/06/03

## 2021-04-28 ENCOUNTER — Other Ambulatory Visit: Payer: Self-pay | Admitting: Family Medicine

## 2021-04-28 ENCOUNTER — Ambulatory Visit: Payer: PPO | Admitting: Physical Therapy

## 2021-04-28 DIAGNOSIS — R110 Nausea: Secondary | ICD-10-CM

## 2021-04-28 NOTE — Telephone Encounter (Signed)
Requested medications are due for refill today.  yes  Requested medications are on the active medications list.  yes  Last refill. 02/01/2021  Future visit scheduled.   yes  Notes to clinic.  Medication not delegated.

## 2021-05-03 ENCOUNTER — Ambulatory Visit: Payer: PPO | Admitting: Physical Therapy

## 2021-05-03 ENCOUNTER — Encounter: Payer: Self-pay | Admitting: Physical Therapy

## 2021-05-03 VITALS — BP 147/46 | HR 62

## 2021-05-03 DIAGNOSIS — M6281 Muscle weakness (generalized): Secondary | ICD-10-CM

## 2021-05-03 DIAGNOSIS — R296 Repeated falls: Secondary | ICD-10-CM

## 2021-05-03 DIAGNOSIS — M778 Other enthesopathies, not elsewhere classified: Secondary | ICD-10-CM | POA: Insufficient documentation

## 2021-05-03 DIAGNOSIS — M659 Synovitis and tenosynovitis, unspecified: Secondary | ICD-10-CM | POA: Insufficient documentation

## 2021-05-03 DIAGNOSIS — R42 Dizziness and giddiness: Secondary | ICD-10-CM

## 2021-05-03 DIAGNOSIS — M542 Cervicalgia: Secondary | ICD-10-CM

## 2021-05-03 DIAGNOSIS — R2681 Unsteadiness on feet: Secondary | ICD-10-CM | POA: Diagnosis not present

## 2021-05-03 DIAGNOSIS — M65841 Other synovitis and tenosynovitis, right hand: Secondary | ICD-10-CM | POA: Diagnosis not present

## 2021-05-03 DIAGNOSIS — M67833 Other specified disorders of tendon, right wrist: Secondary | ICD-10-CM | POA: Diagnosis not present

## 2021-05-03 DIAGNOSIS — M25531 Pain in right wrist: Secondary | ICD-10-CM | POA: Diagnosis not present

## 2021-05-03 NOTE — Therapy (Signed)
[facility] [address], [place], [address] Phone: [phone]   Fax:  [phone]  Physical Therapy Treatment  Patient Details  Name: Katie Buckley MRN: [id number] Date of Birth: 30-Nov-1950 Referring Provider (PT): Loleta Dicker, [place]   Encounter Date: 05/03/2021   PT End of Session - 05/03/21 1704     Visit Number 19    Number of Visits 38    Date for PT Re-Evaluation 06/28/21    Authorization Type [employer]: (Reporting period from 03/03/2021)    Progress Note Due on Visit 20    PT Start Time 1647    PT Stop Time 1727    PT Time Calculation (min) 40 min    Activity Tolerance Patient tolerated treatment well    Behavior During Therapy [facility] for tasks assessed/performed             Past Medical History:  Diagnosis Date   Anxiety    Aortic stenosis, mild    a.) mean gradient 18.7 mmHg on 10/2020 TTE   Depression    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   T2DM (type 2 diabetes mellitus) ([place])     Past Surgical History:  Procedure Laterality Date   ANTERIOR CERVICAL DECOMP/DISCECTOMY FUSION N/A 11/15/2020   Procedure: ANTERIOR CERVICAL DECOMPRESSION/DISCECTOMY FUSION 1 LEVEL C3/4;  Surgeon: Deetta Perla, MD;  Location: [facility] ORS;  Service: Neurosurgery;  Laterality: N/A;   AUGMENTATION MAMMAPLASTY Bilateral    breast implants   Bone Spur  2007   foot   CARDIAC CATHETERIZATION     Carpal Tunnel Symdrome  2008   as repeated in 2009   CATARACT EXTRACTION W/PHACO Left 01/16/2019   Procedure: CATARACT EXTRACTION PHACO AND INTRAOCULAR  LENS PLACEMENT ([place]) LEFT DIABETES VISION BLUE;  Surgeon: Marchia Meiers, MD;  Location: [facility] ORS;  Service: Ophthalmology;  Laterality: Left;  [place]  00:47 CDE 6.62 Fluid pack lot # [id number]   CATARACT EXTRACTION W/PHACO Right 02/06/2019   Procedure: CATARACT EXTRACTION PHACO AND INTRAOCULAR LENS PLACEMENT (I[facility]);  Surgeon: Marchia Meiers, MD;  Location: [facility] ORS;  Service: Ophthalmology;  Laterality: Right;  [place] 00:54.6 CDE 6.52 FLUID PACK LOT # [id number]   CHOLECYSTECTOMY  2007   COLONOSCOPY WITH PROPOFOL N/A 11/17/2015   Procedure: COLONOSCOPY WITH PROPOFOL;  Surgeon: Manya Silvas, MD;  Location: [facility] ENDOSCOPY;  Service: Endoscopy;  Laterality: N/A;   ESOPHAGOGASTRODUODENOSCOPY (EGD) WITH PROPOFOL N/A 11/17/2015   Procedure: ESOPHAGOGASTRODUODENOSCOPY (EGD) WITH PROPOFOL;  Surgeon: Manya Silvas, MD;  Location: [facility] ENDOSCOPY;  Service: Endoscopy;  Laterality: N/A;   EYE SURGERY Bilateral    cataract extraction   FOOT SURGERY     MANDIBLE SURGERY     NASAL SINUS SURGERY  03/2013   Multile surgery Dr. Carlis Abbott. 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 Tamala Julian   TONSILLECTOMY  1960    Vitals:   05/03/21 1649  BP: (!) 147/46  Pulse: 62  SpO2: 99%  Seated, left arm, adult automatic cuff.  Subjective Assessment - 05/03/21 1649     Subjective Patient arrives with a right wrist brace and states it is still bothering her (throbbing 7-8/10) after she felt she hurt it last week helping her husband up. States she is going to get it checked at [facility] after today's visit. States she has some pain across her shoulder and neck, rated 3-4/10. States her balance is "not good" today. Sleep has been on and off because her husband wakes her a lot. She does not feel rested. Stress level is a bit elevated today as well (6/10). No falls since last PT session. She has not been doing her HEP lately.    Pertinent History Patient is a 70 year old female who presents to  outpatient physical therapy with a referral for s/p cervical spinal fusion (ANTERIOR CERVICAL DECOMPRESSION/DISCECTOMY FUSION 1 LEVEL C3/4, 11/15/2020), cervicalgia, multifactoral causes for imbalance, cervical myelopathy. This patient's chief complaints consist of imbalance, history of falls, neck pain, and neck hypomobility, leading to the following functional deficits: difficulty with walking, stairs, lifting items including groceries, balance, bending over, moving her neck, performing ADL's such as getting dressed, showering, bathing. Relevant past medical history and comorbidities include anxiety, aortic stenosis, depression, skin cancer, GERD, heart murmur, hyperchloremia, hypertension, neuropathy, skin cancer, type II diabetes mellitus, asthma, anxiety, left ventricular hypertrophy, diabetic neuropathy and per patient report unexplained weight loss (25-35lbs in past year) and a heart murmur. Patient denies history of stroke, seizure, lung problems, bowel or bladder changes, groin numbness or tingling.    Limitations Lifting;Walking;Standing;House hold activities;Other (comment)   walking, stairs, lifting items including groceries, balance, bending over, moving her neck, performing ADL's such as getting dressed, showering, bathing.   Patient Stated Goals "build up body strength and try to get imbalance under control"    Currently in Pain? Yes    Pain Score 4     Pain Onset More than a month ago              OBJECTIVE     TREATMENT:    Manual therapy: to reduce pain and tissue tension, improve range of motion, neuromodulation, in order to promote improved ability to complete functional activities. HOOKLYING - STM to posterior cervical spine musculature focusing on bilateral upper traps, levator scap, cervical paraspinals, suboccipital muscles.   Neuromuscular Re-education: to improve, balance, postural strength, muscle activation patterns, and stabilization strength required for functional  activities: - supine chin tuck, 2x10 with 5 second hold.  - standing (self-selected stance) VORx1 exercise 3x60 seconds compliant surface. CGA. Loss of balance at times.  - standing roll on the wall, 1x5 reps of 2 rolls each way, SBA for safety. Touchdown on wall for balance when needed. Keeping eyes open and stopping at each end to focus on a letter across the hall.  - vitals check due to feeling worse than usual - see above   Pt required multimodal cuing for proper technique and to facilitate improved neuromuscular control, strength, range of motion, and functional ability resulting in improved performance. Patient required Supervision - min A for safety.    PT Education - 05/03/21 1704     Education Details form/technique with exercise    Person(s) Educated Patient    Methods Explanation;Demonstration;Tactile cues;Verbal cues    Comprehension Verbalized understanding;Returned demonstration;Verbal cues required;Tactile cues required;Need further instruction              PT Short Term Goals - 02/17/21 1619       PT SHORT TERM GOAL #  1   Title Patient will be independent with inital home exercise program to improve strength/mobility/balance and functional independence with ADLs.    Baseline HEP to be provided at 2nd visit    Time 3    Period Weeks    Status Achieved    Target Date 02/03/21               PT Long Term Goals - 04/05/21 1610       PT LONG TERM GOAL #1   Title Patient will be independent with long term home exercise program to improve strength/mobility/balance and functional independence with ADLs.    Baseline Patient reports HEP compliance (03/03/2021); continues to perform HEP (04/05/2021);    Time 12    Period Weeks    Status Partially Met   TARGET DATE FOR ALL LONG TERM GOALS: 04/06/2021. UNMET GOALS UPDATED TO: 06/28/2021     PT LONG TERM GOAL #2   Title Improve cervical AROM rotation to equal or greater than 60 degrees each direction to improve ability  to check blind spot when driving, veiw surroundings, and allow patient to complete valued activities with less difficulty.    Baseline 30 L, 40 R (01/12/2021); 65 L, 71 R. Patient reports pain with L rotation (03/03/2021);  R = 63, L = 56 ipsilateral tension/neck pain (04/05/2021);    Time 12    Period Weeks    Status Partially Met      PT LONG TERM GOAL #3   Title Patient will score equal or greater than 25/30 on Functional Gait Assessment to demonstrate reduction in fall risk to low fall risk classification.    Baseline 11/30 (01/12/2021); 19/30 (03/03/2021); 22/30 (04/05/2021);    Time 12    Period Weeks    Status Partially Met      PT LONG TERM GOAL #4   Title Pt will increase LE strength of by at least 1/2 MMT grade in order to demonstrate improvement in strength, function, and general mobility    Baseline BIL hip flexion 3+, knee extension 4+ (01/13/2021); BIL hip flexion 3+, BIL knee extension 5/5 (03/03/2021); continues to improve with most motions - see objective exam (04/05/2021);    Time 12    Period Weeks    Status Partially Met      PT LONG TERM GOAL #5   Title Demonstrate improved FOTO score to 53 or greater to demonstrate improvement in overall condition and self-reported functional ability    Baseline 45 (01/10/2021); 48 (03/03/2021); 51 (04/05/2021);    Time 12    Period Weeks    Status Partially Met                   Plan - 05/03/21 1831     Clinical Impression Statement Patient had some difficulty with today's session due to increased symptoms and feeling like she is not fully in her body and that it is hard to keep her eyes open. Also reported spinning sensation after wall rolls and needed increased guarding and time for recovery between reps and sets. Vitals taken to assess if this is a cause but did not seem related. Her description and difficulty with exercises resembles prior session where she reported having higher levels of stress and not sleeping well as she has  today. After lengthy discussion with patient about her ability to drive safely, patient strongly preferred driving herself and seemed able to make this decision. She elected to sit for a few min in the  waiting room and stated she was feeling better with rest. Observed to have reasonable speech, thought process, and concerns, and was able to keep eyes open well. Patient would benefit from continued management of limiting condition by skilled physical therapist to address remaining impairments and functional limitations to work towards stated goals and return to PLOF or maximal functional independence.    Personal Factors and Comorbidities Comorbidity 3+;Past/Current Experience;Time since onset of injury/illness/exacerbation    Comorbidities anxiety, aortic stenosis, depression, skin cancer, GERD, heart murmur, hyperchloremia, hypertension, neuropathy, skin cancer, type II diabetes mellitus, asthma, anxiety, left ventricular hypertrophy, diabetic neuropathy and per patient report unexplained weight loss (25-35lbs in past year) and a heart murmur    Examination-Activity Limitations Bathing;Bend;Hygiene/Grooming;Dressing;Carry;Lift;Locomotion Level;Stairs;Squat;Stand;Transfers;Other   walking, stairs, lifting items including groceries, balance, bending over, moving her neck, performing ADL's such as getting dressed, showering, bathing.   Examination-Participation Restrictions Driving;Cleaning;Interpersonal Relationship;Yard Work;Community Activity;Laundry;Shop    Stability/Clinical Decision Making Unstable/Unpredictable    Rehab Potential Fair    PT Frequency 2x / week    PT Duration 12 weeks    PT Treatment/Interventions Cryotherapy;ADLs/Self Care Home Management;Moist Heat;Gait training;Stair training;Therapeutic activities;Functional mobility training;Therapeutic exercise;Balance training;Neuromuscular re-education;Patient/family education;Manual techniques;Passive range of motion;Vestibular;Dry  needling;Biofeedback;Canalith Repostioning;DME Instruction    PT Next Visit Plan Progress vestibular, strength, and balance activities as tolerated.    PT Home Exercise Plan Medbridge Access Code: [email or web address]    Consulted and Agree with Plan of Care Patient             Patient will benefit from skilled therapeutic intervention in order to improve the following deficits and impairments:  Impaired perceived functional ability, Abnormal gait, Decreased activity tolerance, Decreased range of motion, Decreased strength, Dizziness, Hypomobility, Impaired sensation, Pain, Decreased balance, Decreased mobility, Difficulty walking, Impaired UE functional use  Visit Diagnosis: Unsteadiness on feet  Repeated falls  Cervicalgia  Dizziness and giddiness  Muscle weakness (generalized)     Problem List Patient Active Problem List   Diagnosis Date Noted   Cervical myelopathy ([place]) 11/15/2020   LVH (left ventricular hypertrophy) 10/14/2020   COVID-19 05/19/2020   MRSA cellulitis 04/19/2018   Decreased libido 02/04/2018   Diabetes mellitus with neuropathy ([place]) 04/20/2017   H/O adenomatous polyp of colon 08/20/2015   Rupture of implant of right breast 05/18/2015   Anxiety disorder 04/26/2015   Allergic rhinitis 04/21/2015   Anemia 04/21/2015   Asthma 04/21/2015   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/21/2015   Chronic infection of sinus 04/21/2015   Depression 04/21/2015   GERD (gastroesophageal reflux disease) 04/21/2015   Headache 04/21/2015   Menopausal symptoms 04/21/2015   Obesity 04/21/2015   Palpitations 04/21/2015   Vitamin B12 deficiency 04/21/2015   Aortic stenosis 96/29/5284   Diastolic dysfunction 13/24/4010   Diabetes mellitus out of control 06/12/2006   Barrett esophagus 06/13/2003   Essential hypertension 06/12/1998   Genital herpes 06/12/1998   Hyperlipidemia, mixed 06/12/1998    Everlean Alstrom. Graylon Good, PT, DPT 05/03/21, 6:34 PM   [facility] [address] [place], [address] Phone: [phone]   Fax:  [phone]  Name: Katie Buckley MRN: [id number] Date of Birth: [birth year]

## 2021-05-09 ENCOUNTER — Other Ambulatory Visit
Admission: RE | Admit: 2021-05-09 | Discharge: 2021-05-09 | Disposition: A | Discharge status: Home / Self Care | Payer: PPO | Source: Ambulatory Visit | Attending: Internal Medicine | Admitting: Internal Medicine

## 2021-05-09 DIAGNOSIS — R0602 Shortness of breath: Secondary | ICD-10-CM | POA: Insufficient documentation

## 2021-05-09 DIAGNOSIS — I739 Peripheral vascular disease, unspecified: Secondary | ICD-10-CM | POA: Diagnosis not present

## 2021-05-09 DIAGNOSIS — I350 Nonrheumatic aortic (valve) stenosis: Secondary | ICD-10-CM | POA: Diagnosis not present

## 2021-05-09 DIAGNOSIS — I208 Other forms of angina pectoris: Secondary | ICD-10-CM | POA: Diagnosis not present

## 2021-05-09 DIAGNOSIS — E669 Obesity, unspecified: Secondary | ICD-10-CM | POA: Diagnosis not present

## 2021-05-09 DIAGNOSIS — E782 Mixed hyperlipidemia: Secondary | ICD-10-CM | POA: Diagnosis not present

## 2021-05-09 DIAGNOSIS — R0609 Other forms of dyspnea: Secondary | ICD-10-CM | POA: Diagnosis not present

## 2021-05-09 DIAGNOSIS — Z79899 Other long term (current) drug therapy: Secondary | ICD-10-CM | POA: Insufficient documentation

## 2021-05-09 DIAGNOSIS — R270 Ataxia, unspecified: Secondary | ICD-10-CM | POA: Diagnosis not present

## 2021-05-09 DIAGNOSIS — I10 Essential (primary) hypertension: Secondary | ICD-10-CM | POA: Diagnosis not present

## 2021-05-09 DIAGNOSIS — I519 Heart disease, unspecified: Secondary | ICD-10-CM | POA: Insufficient documentation

## 2021-05-09 DIAGNOSIS — E1165 Type 2 diabetes mellitus with hyperglycemia: Secondary | ICD-10-CM | POA: Diagnosis not present

## 2021-05-09 LAB — BRAIN NATRIURETIC PEPTIDE: B Natriuretic Peptide: 119 pg/mL — ABNORMAL HIGH (ref 0.0–100.0)

## 2021-05-10 ENCOUNTER — Ambulatory Visit: Payer: PPO | Admitting: Physical Therapy

## 2021-05-10 ENCOUNTER — Other Ambulatory Visit: Payer: Self-pay

## 2021-05-10 ENCOUNTER — Encounter: Payer: Self-pay | Admitting: Physical Therapy

## 2021-05-10 DIAGNOSIS — M47812 Spondylosis without myelopathy or radiculopathy, cervical region: Secondary | ICD-10-CM | POA: Diagnosis not present

## 2021-05-10 DIAGNOSIS — R42 Dizziness and giddiness: Secondary | ICD-10-CM

## 2021-05-10 DIAGNOSIS — M542 Cervicalgia: Secondary | ICD-10-CM

## 2021-05-10 DIAGNOSIS — Z981 Arthrodesis status: Secondary | ICD-10-CM | POA: Diagnosis not present

## 2021-05-10 DIAGNOSIS — M4692 Unspecified inflammatory spondylopathy, cervical region: Secondary | ICD-10-CM | POA: Diagnosis not present

## 2021-05-10 DIAGNOSIS — R296 Repeated falls: Secondary | ICD-10-CM

## 2021-05-10 DIAGNOSIS — M4322 Fusion of spine, cervical region: Secondary | ICD-10-CM | POA: Diagnosis not present

## 2021-05-10 DIAGNOSIS — M6281 Muscle weakness (generalized): Secondary | ICD-10-CM

## 2021-05-10 DIAGNOSIS — R2681 Unsteadiness on feet: Secondary | ICD-10-CM | POA: Diagnosis not present

## 2021-05-10 NOTE — Therapy (Signed)
[facility] [address] [place], [address] Phone: [phone]   Fax:  [phone]  Physical Therapy Treatment / Progress Note Dates of reporting from 03/03/2021 to 05/10/2021  Patient Details  Name: Katie Buckley MRN: [id number] Date of Birth: 1950-07-16 Referring Provider (PT): Loleta Dicker, [place]   Encounter Date: 05/10/2021   PT End of Session - 05/10/21 1701     Visit Number 20    Number of Visits 76    Date for PT Re-Evaluation 06/28/21    Authorization Type [employer]: (Reporting period from 03/03/2021)    Progress Note Due on Visit 20    PT Start Time 1649    PT Stop Time 1729    PT Time Calculation (min) 40 min    Activity Tolerance Patient tolerated treatment well    Behavior During Therapy [facility] for tasks assessed/performed             Past Medical History:  Diagnosis Date   Anxiety    Aortic stenosis, mild    a.) mean gradient 18.7 mmHg on 10/2020 TTE   Depression    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   T2DM (type 2 diabetes mellitus) ([place])     Past Surgical History:  Procedure Laterality Date   ANTERIOR CERVICAL DECOMP/DISCECTOMY FUSION N/A 11/15/2020   Procedure: ANTERIOR CERVICAL DECOMPRESSION/DISCECTOMY FUSION 1 LEVEL C3/4;  Surgeon: Deetta Perla, MD;  Location: [facility] ORS;  Service: Neurosurgery;  Laterality: N/A;   AUGMENTATION MAMMAPLASTY Bilateral    breast implants   Bone Spur  2007   foot   CARDIAC CATHETERIZATION     Carpal Tunnel Symdrome  2008   as repeated in 2009   CATARACT EXTRACTION W/PHACO Left  01/16/2019   Procedure: CATARACT EXTRACTION PHACO AND INTRAOCULAR LENS PLACEMENT ([place]) LEFT DIABETES VISION BLUE;  Surgeon: Marchia Meiers, MD;  Location: [facility] OR[facility];  Service: Ophthalmology;  Laterality: Left;  [place]  00:47 CDE 6.62 Fluid pack lot # [id number]   CATARACT EXTRACTION W/PHACO Right 02/06/2019   Procedure: CATARACT EXTRACTION PHACO AND INTRAOCULAR LENS PLACEMENT (I[facility]);  Surgeon: Marchia Meiers, MD;  Location: [facility] ORS;  Service: Ophthalmology;  Laterality: Right;  [place] 00:54.6 CDE 6.52 FLUID PACK LOT # [id number]   CHOLECYSTECTOMY  2007   COLONOSCOPY WITH PROPOFOL N/A 11/17/2015   Procedure: COLONOSCOPY WITH PROPOFOL;  Surgeon: Manya Silvas, MD;  Location: [facility] ENDOSCOPY;  Service: Endoscopy;  Laterality: N/A;   ESOPHAGOGASTRODUODENOSCOPY (EGD) WITH PROPOFOL N/A 11/17/2015   Procedure: ESOPHAGOGASTRODUODENOSCOPY (EGD) WITH PROPOFOL;  Surgeon: Manya Silvas, MD;  Location: [facility] ENDOSCOPY;  Service: Endoscopy;  Laterality: N/A;   EYE SURGERY Bilateral    cataract extraction   FOOT SURGERY     MANDIBLE SURGERY     NASAL SINUS SURGERY  03/2013   Multile surgery Dr. Carlis Abbott. 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 Tamala Julian   TONSILLECTOMY  1960    There were no vitals filed for this visit.   Subjective Assessment -  05/10/21 1655     Subjective Patient reports she is feeling better today. She saw cardiologist Dr. Clayborn Bigness yesterday who suggested a heart catheterization. He was also able to hear a heart murmur on both sides of the heart. He wants to make sure the heart is functioning properly. She saw Dr. Lacinda Axon (neurosurgry) today and he released her from his care saying she could continue PT as long as she feels it is helping. She states she feels PT has improved her neck pain but she continues to have difficulty with pain at times and balance. She is continuing to do her HEP including the VORx1, she does stretching her neck, she wanted to do wall rolling  but she didn't have space to do it. She does her chin tucks on her back. She does not do rowing. She has 2/10 pain currently in the base of her neck. She has not had any spells where she just falls out like what happened at [facility] and [employer] prior to surgery. She continues to have episodes where she is very unsteady and doesn't feel well. At first she does not remember going to see orthopedics for her wrist (after last PT session). With further prompts she remembers the appointment. She was told she had a sprain and was given a splint. It was hurting more so she stopped wearing it and got another one from the medical supply store. She reports her balance has improved from PT, for example she can do the VORx1 exercise on the airex pad, but she continues to feel something is "off" and she cannot walk straight. She recognizes she has good and bad days with her balance. She does feel like PT is contributing to her wellbeing and improved function and would like to continue since she still has deficits.    Pertinent History Patient is a 70 year old female who presents to outpatient physical therapy with a referral for s/p cervical spinal fusion (ANTERIOR CERVICAL DECOMPRESSION/DISCECTOMY FUSION 1 LEVEL C3/4, 11/15/2020), cervicalgia, multifactoral causes for imbalance, cervical myelopathy. This patient's chief complaints consist of imbalance, history of falls, neck pain, and neck hypomobility, leading to the following functional deficits: difficulty with walking, stairs, lifting items including groceries, balance, bending over, moving her neck, performing ADL's such as getting dressed, showering, bathing. Relevant past medical history and comorbidities include anxiety, aortic stenosis, depression, skin cancer, GERD, heart murmur, hyperchloremia, hypertension, neuropathy, skin cancer, type II diabetes mellitus, asthma, anxiety, left ventricular hypertrophy, diabetic neuropathy and per patient report unexplained weight  loss (25-35lbs in past year) and a heart murmur. Patient denies history of stroke, seizure, lung problems, bowel or bladder changes, groin numbness or tingling.    Limitations Lifting;Walking;Standing;House hold activities;Other (comment)   walking, stairs, lifting items including groceries, balance, bending over, moving her neck, performing ADL's such as getting dressed, showering, bathing.   Patient Stated Goals "build up body strength and try to get imbalance under control"    Currently in Pain? Yes    Pain Score 2     Pain Onset More than a month ago              OBJECTIVE  SELF-REPORTED FUNCTION FOTO score: 55/100 (neck questionnaire)  SPINE MOTION Cervical Spine AROM R/L - (60) Cervical Flexion: 58 - (80) Cervical Extension: 40 feels some pain in left arm - (45/45) Cervical Lateral Flexion: 35/25 left side feels stiff/strain - (85/85) Cervical Rotation (R/L) 63/57 pain at left neck with left rotation (#) Average/norm value in degrees     *  Indicates pain 01/12/21 03/03/21 04/05/21 05/10/21  Joint/Motion R/L R/L R/L R/L  Hip         Flexion 3+/3+  3+/3+ 5/5 5/5  Extension (knee ext)     3+/3+ 4*/4*  Abduction     4*/4 4+/4   Knee         Extension 4+/4+  5/5 5/5  5/5  Flexion 4/4  5/5 5/5  5/5  Ankle/Foot         Dorsiflexion  3+/3+  3+/4+ 4/4  4+/4+  Great toe extension 5/5  5/5 5/5 5/5   Eversion 5/5  5/5 5/5 5/5   Plantarflexion 4/4  5/5 5/5 5/5    05/10/2021: pain in each thigh with hip extension   FUNCTIONAL/BALANCE TESTS: Functional Gait Assessment: 21/30 moderate fall risk       TREATMENT:     Therapeutic exercise: to centralize symptoms and improve ROM, strength, muscular endurance, and activity tolerance required for successful completion of functional activities.  - measurements (C-spine AROM and LE MMT) to assess progress - see above - education on role of PT in outpatient setting, discussion of progress and shared decision making.   Neuromuscular  Re-education: to improve, balance, postural strength, muscle activation patterns, and stabilization strength required for functional activities: - Functional Gait Assessment to assess progress - see above).   Pt required multimodal cuing for proper technique and to facilitate improved neuromuscular control, strength, range of motion, and functional ability resulting in improved performance. Patient required Supervision - min A for safety.    HOME EXERCISE PROGRAM Access Code: [email or web address] URL: [email or web address] Date: 05/11/2021 Prepared by: Rosita Kea  Exercises Seated Gaze Stabilization with Head Rotation - 3 x daily - 3 sets - 30 seconds time Seated Cervical Retraction - 3 x daily - 1 sets - 20 reps - 1 second hold Supine Cervical Rotation AROM on Pillow - 1-2 x daily - 1 sets - 20 reps - 2 seconds hold Supine Chin Tuck - 1 x daily - 1 sets - 20 reps - 5 seconds hold    PT Education - 05/10/21 1701     Education Details form/technique with exercise.    Person(s) Educated Patient    Methods Explanation;Demonstration;Tactile cues;Verbal cues    Comprehension Verbalized understanding;Returned demonstration;Verbal cues required;Tactile cues required;Need further instruction              PT Short Term Goals - 02/17/21 1619       PT SHORT TERM GOAL #1   Title Patient will be independent with inital home exercise program to improve strength/mobility/balance and functional independence with ADLs.    Baseline HEP to be provided at 2nd visit    Time 3    Period Weeks    Status Achieved    Target Date 02/03/21               PT Long Term Goals - 05/10/21 1728       PT LONG TERM GOAL #1   Title Patient will be independent with long term home exercise program to improve strength/mobility/balance and functional independence with ADLs.    Baseline Patient reports HEP compliance (03/03/2021); continues to perform HEP (04/05/2021); continues to perform HEP  (05/10/2021);    Time 12    Period Weeks    Status Partially Met   TARGET DATE FOR ALL LONG TERM GOALS: 04/06/2021. UNMET GOALS UPDATED TO: 06/28/2021     PT LONG TERM GOAL #2   Title Improve cervical AROM rotation to  equal or greater than 60 degrees each direction to improve ability to check blind spot when driving, veiw surroundings, and allow patient to complete valued activities with less difficulty.    Baseline 30 L, 40 R (01/12/2021); 65 L, 71 R. Patient reports pain with L rotation (03/03/2021);  R = 63, L = 56 ipsilateral tension/neck pain (04/05/2021); R = 63, L = 57 pain at left neck with left rotation (05/10/2021);    Time 12    Period Weeks    Status Partially Met      PT LONG TERM GOAL #3   Title Patient will score equal or greater than 25/30 on Functional Gait Assessment to demonstrate reduction in fall risk to low fall risk classification.    Baseline 11/30 (01/12/2021); 19/30 (03/03/2021); 22/30 (04/05/2021); 21/30 (05/10/2021);    Time 12    Period Weeks    Status Partially Met      PT LONG TERM GOAL #4   Title Pt will increase LE strength of by at least 1/2 MMT grade in order to demonstrate improvement in strength, function, and general mobility    Baseline BIL hip flexion 3+, knee extension 4+ (01/13/2021); BIL hip flexion 3+, BIL knee extension 5/5 (03/03/2021); continues to improve with most motions - see objective exam (04/05/2021); Met except R hip abduction continues to be 4/5 since last meausrement (05/10/2021);    Time 12    Period Weeks    Status Achieved      PT LONG TERM GOAL #5   Title Demonstrate improved FOTO score to 53 or greater to demonstrate improvement in overall condition and self-reported functional ability    Baseline 45 (01/10/2021); 48 (03/03/2021); 51 (04/05/2021); 55 (05/10/2021);    Time 12    Period Weeks    Status Achieved                   Plan - 05/11/21 1452     Clinical Impression Statement Patient has attended 20 physical therapy  sessions this episode of care and has made good progress towards goals addressing neck pain and ROM. She has met her FOTO goal and strength goal, and she has nearly met her ROM goal. She has improved her fall risk from high fall risk to moderate fall risk on the Functional Gait Assessment (now 21/30). However, she continues to show fluctuations in stability of improvement in balance and continues to have days where her symptoms of imbalance and vestibular disturbance are high. When this happens she tends to be very sleepy and have difficulty keeping her eyes open and often describes feeling disconnected from her environment and body. Patient is waiting to be evaluated by a movement disorders center at [facility] and would benefit from continued physical therapy to address vestibular and balance deficits. Will continue to monitor vitals as appropriate and follow any precautions prescribed by cardiologist as he evaluates her cardiovascular function. Patient would benefit from continued management of limiting condition by skilled physical therapist to address remaining impairments and functional limitations to work towards stated goals and return to PLOF or maximal functional independence.    Personal Factors and Comorbidities Comorbidity 3+;Past/Current Experience;Time since onset of injury/illness/exacerbation    Comorbidities anxiety, aortic stenosis, depression, skin cancer, GERD, heart murmur, hyperchloremia, hypertension, neuropathy, skin cancer, type II diabetes mellitus, asthma, anxiety, left ventricular hypertrophy, diabetic neuropathy and per patient report unexplained weight loss (25-35lbs in past year) and a heart murmur    Examination-Activity Limitations Bathing;Bend;Hygiene/Grooming;Dressing;Carry;Lift;Locomotion Level;Stairs;Squat;Stand;Transfers;Other   walking, stairs,  lifting items including groceries, balance, bending over, moving her neck, performing ADL's such as getting dressed, showering, bathing.    Examination-Participation Restrictions Driving;Cleaning;Interpersonal Relationship;Yard Work;Community Activity;Laundry;Shop    Stability/Clinical Decision Making Unstable/Unpredictable    Rehab Potential Fair    PT Frequency 2x / week    PT Duration 12 weeks    PT Treatment/Interventions Cryotherapy;ADLs/Self Care Home Management;Moist Heat;Gait training;Stair training;Therapeutic activities;Functional mobility training;Therapeutic exercise;Balance training;Neuromuscular re-education;Patient/family education;Manual techniques;Passive range of motion;Vestibular;Dry needling;Biofeedback;Canalith Repostioning;DME Instruction    PT Next Visit Plan Progress vestibular, strength, and balance activities as tolerated.    PT Home Exercise Plan Medbridge Access Code: [email or web address]    Consulted and Agree with Plan of Care Patient             Patient will benefit from skilled therapeutic intervention in order to improve the following deficits and impairments:  Impaired perceived functional ability, Abnormal gait, Decreased activity tolerance, Decreased range of motion, Decreased strength, Dizziness, Hypomobility, Impaired sensation, Pain, Decreased balance, Decreased mobility, Difficulty walking, Impaired UE functional use  Visit Diagnosis: Unsteadiness on feet  Repeated falls  Cervicalgia  Dizziness and giddiness  Muscle weakness (generalized)     Problem List Patient Active Problem List   Diagnosis Date Noted   Cervical myelopathy ([place]) 11/15/2020   LVH (left ventricular hypertrophy) 10/14/2020   COVID-19 05/19/2020   MRSA cellulitis 04/19/2018   Decreased libido 02/04/2018   Diabetes mellitus with neuropathy ([place]) 04/20/2017   H/O adenomatous polyp of colon 08/20/2015   Rupture of implant of right breast 05/18/2015   Anxiety disorder 04/26/2015   Allergic rhinitis 04/21/2015   Anemia 04/21/2015   Asthma 04/21/2015   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/21/2015   Chronic infection of sinus  04/21/2015   Depression 04/21/2015   GERD (gastroesophageal reflux disease) 04/21/2015   Headache 04/21/2015   Menopausal symptoms 04/21/2015   Obesity 04/21/2015   Palpitations 04/21/2015   Vitamin B12 deficiency 04/21/2015   Aortic stenosis 29/19/1660   Diastolic dysfunction 60/09/5995   Diabetes mellitus out of control 06/12/2006   Barrett esophagus 06/13/2003   Essential hypertension 06/12/1998   Genital herpes 06/12/1998   Hyperlipidemia, mixed 06/12/1998    Everlean Alstrom. Graylon Good, PT, DPT 05/11/21, 2:54 PM   [facility] [address] [place], [address] Phone: [phone]   Fax:  [phone]  Name: Katie Buckley MRN: [id number] Date of Birth: [birth year]

## 2021-05-12 ENCOUNTER — Encounter: Payer: Self-pay | Admitting: Physical Therapy

## 2021-05-12 ENCOUNTER — Ambulatory Visit: Payer: PPO | Attending: Neurosurgery | Admitting: Physical Therapy

## 2021-05-12 DIAGNOSIS — R2681 Unsteadiness on feet: Secondary | ICD-10-CM | POA: Diagnosis not present

## 2021-05-12 DIAGNOSIS — R296 Repeated falls: Secondary | ICD-10-CM | POA: Insufficient documentation

## 2021-05-12 DIAGNOSIS — M6281 Muscle weakness (generalized): Secondary | ICD-10-CM | POA: Diagnosis not present

## 2021-05-12 DIAGNOSIS — R42 Dizziness and giddiness: Secondary | ICD-10-CM | POA: Insufficient documentation

## 2021-05-12 DIAGNOSIS — M542 Cervicalgia: Secondary | ICD-10-CM | POA: Diagnosis not present

## 2021-05-12 NOTE — Therapy (Signed)
[facility] [address], [place], [address] Phone: [phone]   Fax:  [phone]  Physical Therapy Treatment  Patient Details  Name: Katie Buckley MRN: [id number] Date of Birth: [birth year] Referring Provider (PT): Loleta Dicker, [place]   Encounter Date: 05/12/2021   PT End of Session - 05/12/21 1707     Visit Number 21    Number of Visits 45    Date for PT Re-Evaluation 06/28/21    Authorization Type [employer]: (Reporting period from 05/10/2021)    Progress Note Due on Visit 30    PT Start Time 1607    PT Stop Time 1647    PT Time Calculation (min) 40 min    Equipment Utilized During Treatment Gait belt    Activity Tolerance Patient tolerated treatment well    Behavior During Therapy [facility] for tasks assessed/performed             Past Medical History:  Diagnosis Date   Anxiety    Aortic stenosis, mild    a.) mean gradient 18.7 mmHg on 10/2020 TTE   Depression    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   T2DM (type 2 diabetes mellitus) ([place])     Past Surgical History:  Procedure Laterality Date   ANTERIOR CERVICAL DECOMP/DISCECTOMY FUSION N/A 11/15/2020   Procedure: ANTERIOR CERVICAL DECOMPRESSION/DISCECTOMY FUSION 1 LEVEL C3/4;  Surgeon: Deetta Perla, MD;  Location: [facility] ORS;  Service: Neurosurgery;  Laterality: N/A;   AUGMENTATION MAMMAPLASTY Bilateral    breast implants   Bone Spur  2007   foot   CARDIAC CATHETERIZATION     Carpal Tunnel Symdrome  2008   as repeated in 2009   CATARACT EXTRACTION W/PHACO Left 01/16/2019    Procedure: CATARACT EXTRACTION PHACO AND INTRAOCULAR LENS PLACEMENT ([place]) LEFT DIABETES VISION BLUE;  Surgeon: Marchia Meiers, MD;  Location: [facility] ORS;  Service: Ophthalmology;  Laterality: Left;  [place]  00:47 CDE 6.62 Fluid Buckley lot # [id number]   CATARACT EXTRACTION W/PHACO Right 02/06/2019   Procedure: CATARACT EXTRACTION PHACO AND INTRAOCULAR LENS PLACEMENT ([facility]);  Surgeon: Marchia Meiers, MD;  Location: [facility] ORS;  Service: Ophthalmology;  Laterality: Right;  [place] 00:54.6 CDE 6.52 FLUID Buckley LOT # [id number]   CHOLECYSTECTOMY  2007   COLONOSCOPY WITH PROPOFOL N/A 11/17/2015   Procedure: COLONOSCOPY WITH PROPOFOL;  Surgeon: Manya Silvas, MD;  Location: [facility] ENDOSCOPY;  Service: Endoscopy;  Laterality: N/A;   ESOPHAGOGASTRODUODENOSCOPY (EGD) WITH PROPOFOL N/A 11/17/2015   Procedure: ESOPHAGOGASTRODUODENOSCOPY (EGD) WITH PROPOFOL;  Surgeon: Manya Silvas, MD;  Location[facility] ENDOSCOPY;  Service: Endoscopy;  Laterality: N/A;   EYE SURGERY Bilateral    cataract extraction   FOOT SURGERY     MANDIBLE SURGERY     NASAL SINUS SURGERY  03/2013   Multile surgery Dr. Carlis Abbott. 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 Tamala Julian   TONSILLECTOMY  1960    There were no vitals filed for this visit.   Subjective Assessment -  05/12/21 1708     Subjective Patient reports she feels pretty good today. Does not have any pain. no falls since last PT session.    Pertinent History Patient is a 70 year old female who presents to outpatient physical therapy with a referral for s/p cervical spinal fusion (ANTERIOR CERVICAL DECOMPRESSION/DISCECTOMY FUSION 1 LEVEL C3/4, 11/15/2020), cervicalgia, multifactoral causes for imbalance, cervical myelopathy. This patient's chief complaints consist of imbalance, history of falls, neck pain, and neck hypomobility, leading to the following functional deficits: difficulty with walking, stairs, lifting items including groceries, balance, bending over,  moving her neck, performing ADL's such as getting dressed, showering, bathing. Relevant past medical history and comorbidities include anxiety, aortic stenosis, depression, skin cancer, GERD, heart murmur, hyperchloremia, hypertension, neuropathy, skin cancer, type II diabetes mellitus, asthma, anxiety, left ventricular hypertrophy, diabetic neuropathy and per patient report unexplained weight loss (25-35lbs in past year) and a heart murmur. Patient denies history of stroke, seizure, lung problems, bowel or bladder changes, groin numbness or tingling.    Limitations Lifting;Walking;Standing;House hold activities;Other (comment)   walking, stairs, lifting items including groceries, balance, bending over, moving her neck, performing ADL's such as getting dressed, showering, bathing.   Patient Stated Goals "build up body strength and try to get imbalance under control"    Currently in Pain? No/denies    Pain Onset More than a month ago               TREATMENT:    Neuromuscular Re-education: to improve, balance, postural strength, muscle activation patterns, and stabilization strength required for functional activities: - standing (self-selected stance) VORx1 exercise 3x60 seconds compliant surface. CGA. Loss of balance at times.  - standing roll on the wall, 1x5 reps of 2 rolls each way, SBA for safety. Touchdown on wall for balance when needed. Keeping eyes open and stopping at each end to focus on a letter across the hall.  - BIODEX SR Limits of stability training, feet on E6/E16, static surface, easy difficulty.  Trial 1: 6% 5:41 minutes.  Trial 2: 16% 2:25 minutes - walking over ground with VORx1 plain background, 6x20-25 feet - walking over ground with VORx1 on busy background, 6x20-25 feet. (Slight contralateral movement of target for last 4 sets - much harder).  - backwards walking with CGA - min A, 6x20-30 feet. Patient gets very unsteady after stopping and needs help to prevent  backwards falls at times.  - ambulation ~ 100 feet down ramp from clinic to vehicle with SBA for safety.    Pt required multimodal cuing for proper technique and to facilitate improved neuromuscular control, strength, range of motion, and functional ability resulting in improved performance. Patient required Supervision - min A for safety.    HOME EXERCISE PROGRAM Access Code: [email or web address] URL: [email or web address] Date: 05/11/2021 Prepared by: Rosita Kea   Exercises Seated Gaze Stabilization with Head Rotation - 3 x daily - 3 sets - 30 seconds time Seated Cervical Retraction - 3 x daily - 1 sets - 20 reps - 1 second hold Supine Cervical Rotation AROM on Pillow - 1-2 x daily - 1 sets - 20 reps - 2 seconds hold Supine Chin Tuck - 1 x daily - 1 sets - 20 reps - 5 seconds hold      PT Education - 05/12/21 1706     Education Details form/technique with exercise    Person(s) Educated Patient    Methods Explanation;Demonstration;Tactile cues;Verbal cues    Comprehension Verbalized understanding;Returned demonstration;Verbal cues required;Tactile cues required;Need further  instruction              PT Short Term Goals - 02/17/21 1619       PT SHORT TERM GOAL #1   Title Patient will be independent with inital home exercise program to improve strength/mobility/balance and functional independence with ADLs.    Baseline HEP to be provided at 2nd visit    Time 3    Period Weeks    Status Achieved    Target Date 02/03/21               PT Long Term Goals - 05/10/21 1728       PT LONG TERM GOAL #1   Title Patient will be independent with long term home exercise program to improve strength/mobility/balance and functional independence with ADLs.    Baseline Patient reports HEP compliance (03/03/2021); continues to perform HEP (04/05/2021); continues to perform HEP (05/10/2021);    Time 12    Period Weeks    Status Partially Met   TARGET DATE FOR ALL LONG TERM GOALS:  04/06/2021. UNMET GOALS UPDATED TO: 06/28/2021     PT LONG TERM GOAL #2   Title Improve cervical AROM rotation to equal or greater than 60 degrees each direction to improve ability to check blind spot when driving, veiw surroundings, and allow patient to complete valued activities with less difficulty.    Baseline 30 L, 40 R (01/12/2021); 65 L, 71 R. Patient reports pain with L rotation (03/03/2021);  R = 63, L = 56 ipsilateral tension/neck pain (04/05/2021); R = 63, L = 57 pain at left neck with left rotation (05/10/2021);    Time 12    Period Weeks    Status Partially Met      PT LONG TERM GOAL #3   Title Patient will score equal or greater than 25/30 on Functional Gait Assessment to demonstrate reduction in fall risk to low fall risk classification.    Baseline 11/30 (01/12/2021); 19/30 (03/03/2021); 22/30 (04/05/2021); 21/30 (05/10/2021);    Time 12    Period Weeks    Status Partially Met      PT LONG TERM GOAL #4   Title Pt will increase LE strength of by at least 1/2 MMT grade in order to demonstrate improvement in strength, function, and general mobility    Baseline BIL hip flexion 3+, knee extension 4+ (01/13/2021); BIL hip flexion 3+, BIL knee extension 5/5 (03/03/2021); continues to improve with most motions - see objective exam (04/05/2021); Met except R hip abduction continues to be 4/5 since last meausrement (05/10/2021);    Time 12    Period Weeks    Status Achieved      PT LONG TERM GOAL #5   Title Demonstrate improved FOTO score to 53 or greater to demonstrate improvement in overall condition and self-reported functional ability    Baseline 45 (01/10/2021); 48 (03/03/2021); 51 (04/05/2021); 55 (05/10/2021);    Time 12    Period Weeks    Status Achieved                   Plan - 05/12/21 1712     Clinical Impression Statement Patient tolerated treatment well overall with some difficulty due to increased imbalance with more challenging vestibular and gait exercises. Patient  continues to show progress from when she started PT but has not yet reached prior level of function and continues to have elevated fall risk. Patient would benefit from continued management of limiting condition by skilled physical  therapist to address remaining impairments and functional limitations to work towards stated goals and return to PLOF or maximal functional independence.    Personal Factors and Comorbidities Comorbidity 3+;Past/Current Experience;Time since onset of injury/illness/exacerbation    Comorbidities anxiety, aortic stenosis, depression, skin cancer, GERD, heart murmur, hyperchloremia, hypertension, neuropathy, skin cancer, type II diabetes mellitus, asthma, anxiety, left ventricular hypertrophy, diabetic neuropathy and per patient report unexplained weight loss (25-35lbs in past year) and a heart murmur    Examination-Activity Limitations Bathing;Bend;Hygiene/Grooming;Dressing;Carry;Lift;Locomotion Level;Stairs;Squat;Stand;Transfers;Other   walking, stairs, lifting items including groceries, balance, bending over, moving her neck, performing ADL's such as getting dressed, showering, bathing.   Examination-Participation Restrictions Driving;Cleaning;Interpersonal Relationship;Yard Work;Community Activity;Laundry;Shop    Stability/Clinical Decision Making Unstable/Unpredictable    Rehab Potential Fair    PT Frequency 2x / week    PT Duration 12 weeks    PT Treatment/Interventions Cryotherapy;ADLs/Self Care Home Management;Moist Heat;Gait training;Stair training;Therapeutic activities;Functional mobility training;Therapeutic exercise;Balance training;Neuromuscular re-education;Patient/family education;Manual techniques;Passive range of motion;Vestibular;Dry needling;Biofeedback;Canalith Repostioning;DME Instruction    PT Next Visit Plan Progress vestibular, strength, and balance activities as tolerated.    PT Home Exercise Plan Medbridge Access Code: [email or web address]    Consulted and Agree  with Plan of Care Patient             Patient will benefit from skilled therapeutic intervention in order to improve the following deficits and impairments:  Impaired perceived functional ability, Abnormal gait, Decreased activity tolerance, Decreased range of motion, Decreased strength, Dizziness, Hypomobility, Impaired sensation, Pain, Decreased balance, Decreased mobility, Difficulty walking, Impaired UE functional use  Visit Diagnosis: Unsteadiness on feet  Repeated falls  Cervicalgia  Dizziness and giddiness  Muscle weakness (generalized)     Problem List Patient Active Problem List   Diagnosis Date Noted   Cervical myelopathy ([place]) 11/15/2020   LVH (left ventricular hypertrophy) 10/14/2020   COVID-19 05/19/2020   MRSA cellulitis 04/19/2018   Decreased libido 02/04/2018   Diabetes mellitus with neuropathy ([place]) 04/20/2017   H/O adenomatous polyp of colon 08/20/2015   Rupture of implant of right breast 05/18/2015   Anxiety disorder 04/26/2015   Allergic rhinitis 04/21/2015   Anemia 04/21/2015   Asthma 04/21/2015   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/21/2015   Chronic infection of sinus 04/21/2015   Depression 04/21/2015   GERD (gastroesophageal reflux disease) 04/21/2015   Headache 04/21/2015   Menopausal symptoms 04/21/2015   Obesity 04/21/2015   Palpitations 04/21/2015   Vitamin B12 deficiency 04/21/2015   Aortic stenosis 42/35/3614   Diastolic dysfunction 43/15/4008   Diabetes mellitus out of control 06/12/2006   Barrett esophagus 06/13/2003   Essential hypertension 06/12/1998   Genital herpes 06/12/1998   Hyperlipidemia, mixed 06/12/1998    Everlean Alstrom. Graylon Good, PT, DPT 05/12/21, 5:13 PM   [facility] [address], [place], [address] Phone: [phone]   Fax:  [phone]  Name: Katie Buckley MRN: [id number] Date of Birth: 1951-04-23

## 2021-05-16 ENCOUNTER — Ambulatory Visit: Payer: Self-pay | Admitting: *Deleted

## 2021-05-16 NOTE — Telephone Encounter (Signed)
      Reason for Disposition  [1] Pain or burning with passing urine AND [2] side (flank) or back pain present  Answer Assessment - Initial Assessment Questions 1. COLOR of URINE: "Describe the color of the urine."  (e.g., tea-colored, pink, red, blood clots, bloody)     Mixed in, flecks of bright red blood 2. ONSET: "When did the bleeding start?"      Friday 3. EPISODES: "How many times has there been blood in the urine?" or "How many times today?"     4 4. PAIN with URINATION: "Is there any pain with passing your urine?" If Yes, ask: "How bad is the pain?"  (Scale 1-10; or mild, moderate, severe)    - MILD - complains slightly about urination hurting    - MODERATE - interferes with normal activities      - SEVERE - excruciating, unwilling or unable to urinate because of the pain      Started out as pressure, then pain, burning, blood 5. FEVER: "Do you have a fever?" If Yes, ask: "What is your temperature, how was it measured, and when did it start?"     no 6. ASSOCIATED SYMPTOMS: "Are you passing urine more frequently than usual?"     Yes 7. OTHER SYMPTOMS: "Do you have any other symptoms?" (e.g., back/flank pain, abdominal pain, vomiting)     Lower back pain, pressure at bladder area "  Protocols used: Urine - Blood In-A-AH

## 2021-05-16 NOTE — Telephone Encounter (Signed)
Pt reports burning with urination, onset Friday. States noted "Some flecks of blood this AM."  Also reports lower back pain, frequency.  Denies fever. Noted Ria Comment may have appt today, [facility] can not schedule. Pt called during lunch hour. Advised UC, requesting fit in today, states prefers Dr. Caryn Section. Advised no availability with Dr. Caryn Section and if not able to be seen today, reiterated need for UC.  Asking for something could be called in for her, advised would need to be seen. .Pt verbalizes understanding. Please advise: [phone]

## 2021-05-16 NOTE — Telephone Encounter (Signed)
She needs to go to urgent care and make an appointment to be seen at [facility] this week.

## 2021-05-17 ENCOUNTER — Ambulatory Visit: Payer: PPO | Admitting: Physical Therapy

## 2021-05-17 ENCOUNTER — Encounter: Payer: Self-pay | Admitting: Physical Therapy

## 2021-05-17 DIAGNOSIS — M6281 Muscle weakness (generalized): Secondary | ICD-10-CM

## 2021-05-17 DIAGNOSIS — R296 Repeated falls: Secondary | ICD-10-CM

## 2021-05-17 DIAGNOSIS — F411 Generalized anxiety disorder: Secondary | ICD-10-CM | POA: Diagnosis not present

## 2021-05-17 DIAGNOSIS — R2681 Unsteadiness on feet: Secondary | ICD-10-CM | POA: Diagnosis not present

## 2021-05-17 DIAGNOSIS — R42 Dizziness and giddiness: Secondary | ICD-10-CM

## 2021-05-17 DIAGNOSIS — M542 Cervicalgia: Secondary | ICD-10-CM

## 2021-05-17 DIAGNOSIS — F331 Major depressive disorder, recurrent, moderate: Secondary | ICD-10-CM | POA: Diagnosis not present

## 2021-05-17 NOTE — Telephone Encounter (Signed)
LMTCB 05/17/2021.  [facility] please advise pt below when she calls back.   Thanks,   -Mickel Baas

## 2021-05-17 NOTE — Therapy (Signed)
[facility] [address], [place], [address] Phone: [phone]   Fax:  [phone]  Physical Therapy Treatment  Patient Details  Name: Katie Buckley MRN: [id number] Date of Birth: [birth year] Referring Provider (PT): Loleta Dicker, [place]   Encounter Date: 05/17/2021   PT End of Session - 05/17/21 1620     Visit Number 22    Number of Visits 88    Date for PT Re-Evaluation 06/28/21    Authorization Type [employer]: (Reporting period from 05/10/2021)    Progress Note Due on Visit 30    PT Start Time 1604    PT Stop Time 1642    PT Time Calculation (min) 38 min    Equipment Utilized During Treatment Gait belt    Activity Tolerance Patient tolerated treatment well    Behavior During Therapy [facility] for tasks assessed/performed             Past Medical History:  Diagnosis Date   Anxiety    Aortic stenosis, mild    a.) mean gradient 18.7 mmHg on 10/2020 TTE   Depression    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   T2DM (type 2 diabetes mellitus) ([place])     Past Surgical History:  Procedure Laterality Date   ANTERIOR CERVICAL DECOMP/DISCECTOMY FUSION N/A 11/15/2020   Procedure: ANTERIOR CERVICAL DECOMPRESSION/DISCECTOMY FUSION 1 LEVEL C3/4;  Surgeon: Deetta Perla, MD;  Location: [facility] ORS;  Service: Neurosurgery;  Laterality: N/A;   AUGMENTATION MAMMAPLASTY Bilateral    breast implants   Bone Spur  2007   foot   CARDIAC CATHETERIZATION     Carpal Tunnel Symdrome  2008   as repeated in 2009   CATARACT EXTRACTION W/PHACO Left 01/16/2019    Procedure: CATARACT EXTRACTION PHACO AND INTRAOCULAR LENS PLACEMENT ([place]) LEFT DIABETES VISION BLUE;  Surgeon: Marchia Meiers, MD;  Location: [facility] ORS;  Service: Ophthalmology;  Laterality: Left;  [place]  00:47 CDE 6.62 Fluid pack lot # [id number]   CATARACT EXTRACTION W/PHACO Right 02/06/2019   Procedure: CATARACT EXTRACTION PHACO AND INTRAOCULAR LENS PLACEMENT (I[facility]);  Surgeon: Marchia Meiers, MD;  Location: [facility] ORS;  Service: Ophthalmology;  Laterality: Right;  [place] 00:54.6 CDE 6.52 FLUID PACK LOT # [id number]   CHOLECYSTECTOMY  2007   COLONOSCOPY WITH PROPOFOL N/A 11/17/2015   Procedure: COLONOSCOPY WITH PROPOFOL;  Surgeon: Manya Silvas, MD;  Location: [facility] ENDOSCOPY;  Service: Endoscopy;  Laterality: N/A;   ESOPHAGOGASTRODUODENOSCOPY (EGD) WITH PROPOFOL N/A 11/17/2015   Procedure: ESOPHAGOGASTRODUODENOSCOPY (EGD) WITH PROPOFOL;  Surgeon: Manya Silvas, MD;  Location: [facility] ENDOSCOPY;  Service: Endoscopy;  Laterality: N/A;   EYE SURGERY Bilateral    cataract extraction   FOOT SURGERY     MANDIBLE SURGERY     NASAL SINUS SURGERY  03/2013   Multile surgery Dr. Carlis Abbott. DX eosinophilic sinusitis [phone]   Release of Trigger Finger Right 10/2010   Dr. Tamala Julian   TONSILLECTOMY  1960    There were no vitals filed for this visit.   Subjective Assessment -  05/17/21 1609     Subjective Patient reports she is feeling well. Reports "a little" pain in her neck (1-2/10). States she felt okay after last PT session and has not had any falls since last PT session. She has her heart catheterization scheduled for next week on 12/13.    Pertinent History Patient is a 70 year old female who presents to outpatient physical therapy with a referral for s/p cervical spinal fusion (ANTERIOR CERVICAL DECOMPRESSION/DISCECTOMY FUSION 1 LEVEL C3/4, 11/15/2020), cervicalgia, multifactoral causes for imbalance, cervical myelopathy. This patient's chief complaints consist of imbalance, history of falls, neck pain, and neck  hypomobility, leading to the following functional deficits: difficulty with walking, stairs, lifting items including groceries, balance, bending over, moving her neck, performing ADL's such as getting dressed, showering, bathing. Relevant past medical history and comorbidities include anxiety, aortic stenosis, depression, skin cancer, GERD, heart murmur, hyperchloremia, hypertension, neuropathy, skin cancer, type II diabetes mellitus, asthma, anxiety, left ventricular hypertrophy, diabetic neuropathy and per patient report unexplained weight loss (25-35lbs in past year) and a heart murmur. Patient denies history of stroke, seizure, lung problems, bowel or bladder changes, groin numbness or tingling.    Limitations Lifting;Walking;Standing;House hold activities;Other (comment)   walking, stairs, lifting items including groceries, balance, bending over, moving her neck, performing ADL's such as getting dressed, showering, bathing.   Patient Stated Goals "build up body strength and try to get imbalance under control"    Currently in Pain? Yes    Pain Score 2     Pain Onset More than a month ago             TREATMENT:    Neuromuscular Re-education: to improve, balance, postural strength, muscle activation patterns, and stabilization strength required for functional activities: - standing (self-selected stance) VORx1 exercise 3x60 seconds compliant surface. SBA - standing roll on the wall, 1x5 reps of 2 rolls each way, SBA for safety. Touchdown on wall for balance when needed. Keeping eyes open and stopping at each end to focus on a letter across the hall.  - static balance: standing on airex with eyes closed and touchdown UE support as needed, SBA, 3x60 seconds.  - walking over ground with VORx1 plain background, 6x20-25 feet - walking over ground with VORx1 on busy background, 6x20-25 feet, Slight contralateral movement of target - backwards walking (with unpredictable forward stretches) with CGA -  min A, 4x20-30 feet. Patient gets very unsteady after stopping and needs help to prevent backwards falls at times.   - BIODEX SR Limits of stability training, feet on E6/E16, static surface, easy difficulty.  Trial 1: 12% 2:21 minutes.  Trial 2: 23% 1.23 minutes  Pt required multimodal cuing for proper technique and to facilitate improved neuromuscular control, strength, range of motion, and functional ability resulting in improved performance. Patient required Supervision - min A for safety.    HOME EXERCISE PROGRAM Access Code: [email or web address] URL: [email or web address] Date: 05/11/2021 Prepared by: Rosita Kea   Exercises Seated Gaze Stabilization with Head Rotation - 3 x daily - 3 sets - 30 seconds time Seated Cervical Retraction - 3 x daily - 1 sets - 20 reps - 1 second hold Supine Cervical Rotation AROM on Pillow - 1-2 x daily - 1 sets - 20 reps - 2 seconds hold Supine Chin Tuck - 1 x daily - 1 sets - 20 reps - 5 seconds hold     PT Education - 05/17/21 1620     Education Details form/technique with exercise  Person(s) Educated Patient    Methods Explanation;Demonstration;Tactile cues;Verbal cues    Comprehension Verbalized understanding;Returned demonstration;Verbal cues required;Tactile cues required;Need further instruction              PT Short Term Goals - 02/17/21 1619       PT SHORT TERM GOAL #1   Title Patient will be independent with inital home exercise program to improve strength/mobility/balance and functional independence with ADLs.    Baseline HEP to be provided at 2nd visit    Time 3    Period Weeks    Status Achieved    Target Date 02/03/21               PT Long Term Goals - 05/10/21 1728       PT LONG TERM GOAL #1   Title Patient will be independent with long term home exercise program to improve strength/mobility/balance and functional independence with ADLs.    Baseline Patient reports HEP compliance (03/03/2021); continues to  perform HEP (04/05/2021); continues to perform HEP (05/10/2021);    Time 12    Period Weeks    Status Partially Met   TARGET DATE FOR ALL LONG TERM GOALS: 04/06/2021. UNMET GOALS UPDATED TO: 06/28/2021     PT LONG TERM GOAL #2   Title Improve cervical AROM rotation to equal or greater than 60 degrees each direction to improve ability to check blind spot when driving, veiw surroundings, and allow patient to complete valued activities with less difficulty.    Baseline 30 L, 40 R (01/12/2021); 65 L, 71 R. Patient reports pain with L rotation (03/03/2021);  R = 63, L = 56 ipsilateral tension/neck pain (04/05/2021); R = 63, L = 57 pain at left neck with left rotation (05/10/2021);    Time 12    Period Weeks    Status Partially Met      PT LONG TERM GOAL #3   Title Patient will score equal or greater than 25/30 on Functional Gait Assessment to demonstrate reduction in fall risk to low fall risk classification.    Baseline 11/30 (01/12/2021); 19/30 (03/03/2021); 22/30 (04/05/2021); 21/30 (05/10/2021);    Time 12    Period Weeks    Status Partially Met      PT LONG TERM GOAL #4   Title Pt will increase LE strength of by at least 1/2 MMT grade in order to demonstrate improvement in strength, function, and general mobility    Baseline BIL hip flexion 3+, knee extension 4+ (01/13/2021); BIL hip flexion 3+, BIL knee extension 5/5 (03/03/2021); continues to improve with most motions - see objective exam (04/05/2021); Met except R hip abduction continues to be 4/5 since last meausrement (05/10/2021);    Time 12    Period Weeks    Status Achieved      PT LONG TERM GOAL #5   Title Demonstrate improved FOTO score to 53 or greater to demonstrate improvement in overall condition and self-reported functional ability    Baseline 45 (01/10/2021); 48 (03/03/2021); 51 (04/05/2021); 55 (05/10/2021);    Time 12    Period Weeks    Status Achieved                   Plan - 05/17/21 1648     Clinical Impression  Statement Patient tolerated treatment well overall and was able to continue training balance exercises at a similar level to last session. Patient demonstrated improvements on BIODEX limits of stability exercise and backwards walking but continues to  have significant imbalance with head turns and changes in direction.  Patient would benefit from continued management of limiting condition by skilled physical therapist to address remaining impairments and functional limitations to work towards stated goals and return to PLOF or maximal functional independence.    Personal Factors and Comorbidities Comorbidity 3+;Past/Current Experience;Time since onset of injury/illness/exacerbation    Comorbidities anxiety, aortic stenosis, depression, skin cancer, GERD, heart murmur, hyperchloremia, hypertension, neuropathy, skin cancer, type II diabetes mellitus, asthma, anxiety, left ventricular hypertrophy, diabetic neuropathy and per patient report unexplained weight loss (25-35lbs in past year) and a heart murmur    Examination-Activity Limitations Bathing;Bend;Hygiene/Grooming;Dressing;Carry;Lift;Locomotion Level;Stairs;Squat;Stand;Transfers;Other   walking, stairs, lifting items including groceries, balance, bending over, moving her neck, performing ADL's such as getting dressed, showering, bathing.   Examination-Participation Restrictions Driving;Cleaning;Interpersonal Relationship;Yard Work;Community Activity;Laundry;Shop    Stability/Clinical Decision Making Unstable/Unpredictable    Rehab Potential Fair    PT Frequency 2x / week    PT Duration 12 weeks    PT Treatment/Interventions Cryotherapy;ADLs/Self Care Home Management;Moist Heat;Gait training;Stair training;Therapeutic activities;Functional mobility training;Therapeutic exercise;Balance training;Neuromuscular re-education;Patient/family education;Manual techniques;Passive range of motion;Vestibular;Dry needling;Biofeedback;Canalith Repostioning;DME  Instruction    PT Next Visit Plan Progress vestibular, strength, and balance activities as tolerated.    PT Home Exercise Plan Medbridge Access Code: [email or web address]    Consulted and Agree with Plan of Care Patient             Patient will benefit from skilled therapeutic intervention in order to improve the following deficits and impairments:  Impaired perceived functional ability, Abnormal gait, Decreased activity tolerance, Decreased range of motion, Decreased strength, Dizziness, Hypomobility, Impaired sensation, Pain, Decreased balance, Decreased mobility, Difficulty walking, Impaired UE functional use  Visit Diagnosis: Unsteadiness on feet  Repeated falls  Cervicalgia  Dizziness and giddiness  Muscle weakness (generalized)     Problem List Patient Active Problem List   Diagnosis Date Noted   Cervical myelopathy ([place]) 11/15/2020   LVH (left ventricular hypertrophy) 10/14/2020   COVID-19 05/19/2020   MRSA cellulitis 04/19/2018   Decreased libido 02/04/2018   Diabetes mellitus with neuropathy ([place]) 04/20/2017   H/O adenomatous polyp of colon 08/20/2015   Rupture of implant of right breast 05/18/2015   Anxiety disorder 04/26/2015   Allergic rhinitis 04/21/2015   Anemia 04/21/2015   Asthma 04/21/2015   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/21/2015   Chronic infection of sinus 04/21/2015   Depression 04/21/2015   GERD (gastroesophageal reflux disease) 04/21/2015   Headache 04/21/2015   Menopausal symptoms 04/21/2015   Obesity 04/21/2015   Palpitations 04/21/2015   Vitamin B12 deficiency 04/21/2015   Aortic stenosis 44/08/4740   Diastolic dysfunction 59/56/3875   Diabetes mellitus out of control 06/12/2006   Barrett esophagus 06/13/2003   Essential hypertension 06/12/1998   Genital herpes 06/12/1998   Hyperlipidemia, mixed 06/12/1998    Everlean Alstrom. Graylon Good, PT, DPT 05/17/21, 4:48 PM   [facility] [address] [place], [address] Phone: [phone]   Fax:  [phone]  Name: Katie Buckley MRN: [id number] Date of Birth: [birth year]

## 2021-05-18 ENCOUNTER — Encounter: Payer: Self-pay | Admitting: Emergency Medicine

## 2021-05-18 ENCOUNTER — Ambulatory Visit
Admission: EM | Admit: 2021-05-18 | Discharge: 2021-05-18 | Disposition: A | Discharge status: Home / Self Care | Payer: PPO | Attending: Emergency Medicine | Admitting: Emergency Medicine

## 2021-05-18 ENCOUNTER — Other Ambulatory Visit: Payer: Self-pay

## 2021-05-18 DIAGNOSIS — R300 Dysuria: Secondary | ICD-10-CM | POA: Insufficient documentation

## 2021-05-18 DIAGNOSIS — I10 Essential (primary) hypertension: Secondary | ICD-10-CM | POA: Diagnosis not present

## 2021-05-18 DIAGNOSIS — J329 Chronic sinusitis, unspecified: Secondary | ICD-10-CM | POA: Diagnosis not present

## 2021-05-18 LAB — POCT URINALYSIS DIP (MANUAL ENTRY)
Bilirubin, UA: NEGATIVE
Glucose, UA: NEGATIVE mg/dL
Ketones, POC UA: NEGATIVE mg/dL
Nitrite, UA: NEGATIVE
Protein Ur, POC: 30 mg/dL — AB
Spec Grav, UA: 1.015 (ref 1.010–1.025)
Urobilinogen, UA: 0.2 E.U./dL
pH, UA: 6 (ref 5.0–8.0)

## 2021-05-18 MED ORDER — SULFAMETHOXAZOLE-TRIMETHOPRIM 800-160 MG PO TABS: 1.0000 | ORAL_TABLET | Freq: Two times a day (BID) | ORAL | 0 refills | Status: AC

## 2021-05-18 NOTE — Discharge Instructions (Addendum)
Take the antibiotic as directed.  Follow up with your primary care provider if your symptoms are not improving.    The urine culture is pending.  We will call you if it shows the need to change or discontinue your antibiotic.    Your blood pressure is elevated today at 163/72.  Please have this rechecked by your primary care provider in 2-4 weeks.

## 2021-05-18 NOTE — ED Provider Notes (Signed)
Katie Buckley    CSN: [id number] Arrival date & time: 05/18/21  1357      History   Chief Complaint Chief Complaint  Patient presents with   Dysuria   Nasal Congestion    HPI [place] is a 70 y.o. female.  Patient presents with 4-day history of dysuria, urinary frequency, bladder pressure, cloudy urine.  Treatment attempted at home with increased water intake.  Patient also reports sinus pressure and congestion for >1 week.  She has a history of chronic headaches and is followed by ENT.  She denies fever, chills, cough, shortness of breath, nausea, vomiting, diarrhea, vaginal discharge, pelvic pain, flank pain, or other symptoms.  Her medical history includes diabetes and hypertension.  The history is provided by the patient and medical records.   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/21/2015   Chronic infection of sinus 04/21/2015    Depression 04/21/2015   GERD (gastroesophageal reflux disease) 04/21/2015   Headache 04/21/2015   Menopausal symptoms 04/21/2015   Obesity 04/21/2015   Palpitations 04/21/2015   Vitamin B12 deficiency 04/21/2015   Aortic stenosis 96/29/5284   Diastolic dysfunction 13/24/4010   Diabetes mellitus out of control 06/12/2006   Barrett esophagus 06/13/2003   Essential hypertension 06/12/1998   Genital herpes 06/12/1998   Hyperlipidemia, mixed 06/12/1998    Past Surgical History:  Procedure Laterality Date   ANTERIOR CERVICAL DECOMP/DISCECTOMY FUSION N/A 11/15/2020   Procedure: ANTERIOR CERVICAL DECOMPRESSION/DISCECTOMY FUSION 1 LEVEL C3/4;  Surgeon: Deetta Perla, MD;  Location: [facility] ORS;  Service: Neurosurgery;  Laterality: N/A;   AUGMENTATION MAMMAPLASTY Bilateral    breast implants   Bone Spur  2007   foot   CARDIAC CATHETERIZATION     Carpal Tunnel Symdrome  2008   as repeated in 2009   CATARACT EXTRACTION W/PHACO Left 01/16/2019   Procedure: CATARACT EXTRACTION PHACO AND INTRAOCULAR LENS PLACEMENT ([place]) LEFT DIABETES VISION BLUE;  Surgeon: Marchia Meiers, MD;  Location: [facility] ORS;  Service: Ophthalmology;  Laterality: Left;  [place]  00:47 CDE 6.62 Fluid pack lot # [id number]   CATARACT EXTRACTION W/PHACO Right 02/06/2019   Procedure: CATARACT EXTRACTION PHACO AND INTRAOCULAR LENS PLACEMENT (I[facility]);  Surgeon: Marchia Meiers, MD;  Location: [facility] ORS;  Service: Ophthalmology;  Laterality: Right;  [place] 00:54.6 CDE 6.52 FLUID PACK LOT #  [id number]   CHOLECYSTECTOMY  2007   COLONOSCOPY WITH PROPOFOL N/A 11/17/2015   Procedure: COLONOSCOPY WITH PROPOFOL;  Surgeon: Manya Silvas, MD;  Location: [facility] ENDOSCOPY;  Service: Endoscopy;  Laterality: N/A;   ESOPHAGOGASTRODUODENOSCOPY (EGD) WITH PROPOFOL N/A 11/17/2015   Procedure: ESOPHAGOGASTRODUODENOSCOPY (EGD) WITH PROPOFOL;  Surgeon: Manya Silvas, MD;  Location: [facility] ENDOSCOPY;  Service: Endoscopy;  Laterality: N/A;   EYE SURGERY Bilateral    cataract  extraction   FOOT SURGERY     MANDIBLE SURGERY     NASAL SINUS SURGERY  03/2013   Multile surgery Dr. Carlis Abbott. 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 Tamala Julian   TONSILLECTOMY  1960    OB History     Gravida  2   Para  1   Term      Preterm      AB      Living         SAB      IAB      Ectopic      Multiple      Live Births               Home Medications    Prior to Admission medications   Medication Sig Start Date End Date Taking? Authorizing Provider  sulfamethoxazole-trimethoprim (BACTRIM DS) 800-160 MG tablet Take 1 tablet by mouth 2 (two) times daily for 7 days. 05/18/21 05/25/21 Yes Sharion Balloon, NP  azelastine (ASTELIN) 0.1 % nasal spray Place 1-2 sprays into both nostrils every 12 (twelve) hours as needed. 10/05/20   [provider]  buPROPion (WELLBUTRIN SR) 100 MG 12 hr tablet Take 100 mg by mouth 2 (two) times daily. 05/25/20   [provider]  butalbital-acetaminophen-caffeine (FIORICET) 50-325-40 MG tablet TAKE 1 TABLET BY MOUTH EVERY 6 HOURS AS NEEDED FOR HEADACHE 03/03/21   [name], MD  empagliflozin (JARDIANCE) 25 MG TABS tablet Take 1 tablet (25 mg total) by mouth daily. 10/27/20   [name], MD  Esomeprazole Magnesium 20 MG TBEC Take 20 mg by mouth daily before breakfast.    [provider]  ezetimibe (ZETIA) 10 MG tablet TAKE 1 TABLET BY MOUTH EVERYDAY AT BEDTIME Patient taking differently: Take 10 mg by mouth at bedtime. 07/07/20   [name], MD  fexofenadine (ALLEGRA) 180 MG tablet Take 180 mg by mouth daily as needed for allergies or rhinitis.    [provider]  fluconazole (DIFLUCAN) 150 MG tablet Take one tablet on day you receive medication Take second tablet three days later Take third and last tablet three days later 01/26/21   Tally Joe T, FNP  gabapentin (NEURONTIN) 300 MG capsule TAKE 2 CAPSULES (600 MG TOTAL) BY MOUTH 2 (TWO) TIMES  DAILY. MORNING & SUPPER 03/02/21   [name], MD  glipiZIDE (GLUCOTROL) 10 MG tablet TAKE 1 TABLET BY MOUTH EVERY DAY BEFORE BREAKFAST 03/02/21   [name], MD  hydrOXYzine (VISTARIL) 25 MG capsule Take 25 mg by mouth daily as needed for anxiety. 10/05/20   [provider]  irbesartan (AVAPRO) 75 MG tablet TAKE 1 TABLET BY MOUTH EVERY DAY 12/17/20   [name], MD  Lancet Devices MISC     [provider]  metFORMIN (GLUCOPHAGE-XR) 500 MG 24 hr tablet Take 2 tablets (1,000 mg total) by mouth daily. Patient taking differently: Take 500 mg by mouth daily. 10/27/20   [name], MD  methocarbamol (ROBAXIN) 500 MG tablet Take 1 tablet (500 mg total) by mouth every 6 (six) hours as needed for muscle spasms. 11/16/20   Deetta Perla, MD  metoprolol-hydrochlorothiazide (LOPRESSOR HCT) 100-25 MG tablet Take 0.5 tablets by mouth daily. 10/14/20   [name], MD  montelukast (SINGULAIR) 10 MG tablet TAKE 1 TABLET BY MOUTH EVERY DAY Patient taking differently: Take 10 mg by mouth daily as needed (allergies). 05/18/20   [name], MD  Olopatadine HCl 0.7 % SOLN Apply 1 drop to eye daily as needed.    [provider]  ondansetron (ZOFRAN ODT) 4 MG disintegrating tablet Take 1 tablet (4 mg total) by mouth every 8 (eight) hours as needed for nausea or vomiting. 09/29/20   Harvest Dark, MD  ondansetron (ZOFRAN) 4 MG tablet TAKE 1 TABLET BY MOUTH EVERY 8 HOURS AS NEEDED 04/28/21   [name], MD  oxyCODONE (OXY IR/ROXICODONE) 5 MG immediate release tablet Take 1 tablet (5 mg total) by mouth every 6 (six) hours as needed for moderate pain ((score 4 to 6)). 11/16/20   Deetta Perla, MD  rosuvastatin (CRESTOR) 20 MG tablet TAKE 1 TABLET BY MOUTH EVERY DAY IN THE EVENING 02/28/21   [name], MD  senna (SENOKOT) 8.6 MG TABS tablet Take 1 tablet (8.6 mg total) by mouth 2 (two) times daily. 11/16/20   Deetta Perla, MD  sertraline (ZOLOFT) 100 MG tablet Take  200 mg by mouth daily.    [provider]  tirzepatide Darcel Bayley) 5 MG/0.5ML Pen Inject 5 mg into the skin once a week. 04/26/21   [name], MD  traZODone (DESYREL) 50 MG tablet Take 50-100 mg by mouth at bedtime as needed for sleep.     [provider]  valACYclovir (VALTREX) 1000 MG tablet Take 1 tablet daily for 5 days as needed 02/17/16   [name], MD    Family History Family History  Problem Relation Age of Onset   Diabetes Father    Lung cancer Mother    Breast cancer Maternal Aunt    Irritable bowel syndrome Sister    Colon cancer Brother     Social History Social History   Tobacco Use   Smoking status: Never   Smokeless tobacco: Never  Vaping Use   Vaping Use: Never used  Substance Use Topics   Alcohol use: No   Drug use: No     Allergies   Doxycycline and Biaxin [clarithromycin]   Review of Systems Review of Systems  Constitutional:  Negative for chills and fever.  HENT:  Positive for congestion and sinus pressure. Negative for ear pain and sore throat.   Respiratory:  Negative for cough and shortness of breath.   Cardiovascular:  Negative for chest pain and palpitations.  Gastrointestinal:  Positive for abdominal pain. Negative for diarrhea and vomiting.  Genitourinary:  Positive for dysuria and frequency. Negative for flank pain, hematuria, pelvic pain and vaginal discharge.  Skin:  Negative for color change and rash.  All other systems reviewed and are negative.   Physical Exam Triage Vital Signs ED Triage Vitals  Enc Vitals Group     BP 05/18/21 1415 (!) 163/72     Pulse Rate 05/18/21 1415 95     Resp 05/18/21 1415 18     Temp 05/18/21 1415 98.9 F (37.2 C)     Temp Source 05/18/21 1415 Oral     SpO2 05/18/21 1415 96 %     Weight --  Height --      Head Circumference --      Peak Flow --      Pain Score 05/18/21 1419 5     Pain Loc --      Pain Edu? --      Excl. in [place]? --    No data found.  Updated  Vital Signs BP (!) 163/72 (BP Location: Left Arm)   Pulse 95   Temp 98.9 F (37.2 C) (Oral)   Resp 18   SpO2 96%   Visual Acuity Right Eye Distance:   Left Eye Distance:   Bilateral Distance:    Right Eye Near:   Left Eye Near:    Bilateral Near:     Physical Exam Vitals and nursing note reviewed.  Constitutional:      General: She is not in acute distress.    Appearance: She is well-developed. She is not ill-appearing.  HENT:     Right Ear: Tympanic membrane normal.     Left Ear: Tympanic membrane normal.     Nose: Nose normal.     Mouth/Throat:     Mouth: Mucous membranes are moist.     Pharynx: Oropharynx is clear.  Cardiovascular:     Rate and Rhythm: Normal rate and regular rhythm.     Heart sounds: Normal heart sounds.  Pulmonary:     Effort: Pulmonary effort is normal. No respiratory distress.     Breath sounds: Normal breath sounds.  Abdominal:     General: Bowel sounds are normal.     Palpations: Abdomen is soft.     Tenderness: There is no abdominal tenderness. There is no right CVA tenderness, left CVA tenderness, guarding or rebound.  Musculoskeletal:     Cervical back: Neck supple.  Skin:    General: Skin is warm and dry.     Capillary Refill: Capillary refill takes less than 2 seconds.  Neurological:     Mental Status: She is alert.  Psychiatric:        Mood and Affect: Mood normal.        Behavior: Behavior normal.     UC Treatments / Results  Labs (all labs ordered are listed, but only abnormal results are displayed) Labs Reviewed  POCT URINALYSIS DIP (MANUAL ENTRY) - Abnormal; Notable for the following components:      Result Value   Clarity, UA cloudy (*)    Blood, UA moderate (*)    Protein Ur, POC =30 (*)    Leukocytes, UA Large (3+) (*)    All other components within normal limits  URINE CULTURE    EKG   Radiology No results found.  Procedures Procedures (including critical care time)  Medications Ordered in  UC Medications - No data to display  Initial Impression / Assessment and Plan / UC Course  I have reviewed the triage vital signs and the nursing notes.  Pertinent labs & imaging results that were available during my care of the patient were reviewed by me and considered in my medical decision making (see chart for details).   Dysuria, Chronic sinusitis, Elevated blood pressure with HTN.  Treating with Septra DS.  Urine culture pending.  Education provided on dysuria and sinusitis.  Instructed her to follow-up with her PCP if her symptoms are not improving.  Discussed with patient that her blood pressure is elevated today and needs to be rechecked by her PCP in 2 to 4 weeks.  Education provided on managing hypertension.  Patient  agrees to plan of care.      Final Clinical Impressions(s) / UC Diagnoses   Final diagnoses:  Dysuria  Chronic sinusitis, unspecified location  Elevated blood pressure reading in office with diagnosis of hypertension     Discharge Instructions      Take the antibiotic as directed.    The urine culture is pending.  We will call you if it shows the need to change or discontinue your antibiotic.    Follow up with your primary care provider.         ED Prescriptions     Medication Sig Dispense Auth. Provider   sulfamethoxazole-trimethoprim (BACTRIM DS) 800-160 MG tablet Take 1 tablet by mouth 2 (two) times daily for 7 days. 14 tablet Sharion Balloon, NP      PDMP not reviewed this encounter.   Sharion Balloon, NP 05/18/21 1513

## 2021-05-18 NOTE — ED Triage Notes (Signed)
Pt here with increased urinary frequency, abdominal pressure, burning while urinating and cloudy urine x 4 days. Pt also c/o nasal congestion and sinus pressure for over a week.

## 2021-05-18 NOTE — Telephone Encounter (Signed)
Patient advised. She agrees to try going to an urgent care.

## 2021-05-19 ENCOUNTER — Encounter: Payer: Self-pay | Admitting: Physical Therapy

## 2021-05-19 ENCOUNTER — Encounter: Payer: Self-pay | Admitting: Emergency Medicine

## 2021-05-19 ENCOUNTER — Emergency Department
Admission: EM | Admit: 2021-05-19 | Discharge: 2021-05-19 | Disposition: A | Discharge status: Home / Self Care | Payer: PPO | Attending: Emergency Medicine | Admitting: Emergency Medicine

## 2021-05-19 ENCOUNTER — Other Ambulatory Visit: Payer: Self-pay

## 2021-05-19 ENCOUNTER — Ambulatory Visit: Payer: PPO | Admitting: Physical Therapy

## 2021-05-19 VITALS — BP 178/54 | HR 66

## 2021-05-19 DIAGNOSIS — R42 Dizziness and giddiness: Secondary | ICD-10-CM

## 2021-05-19 DIAGNOSIS — Z5321 Procedure and treatment not carried out due to patient leaving prior to being seen by health care provider: Secondary | ICD-10-CM | POA: Insufficient documentation

## 2021-05-19 DIAGNOSIS — M542 Cervicalgia: Secondary | ICD-10-CM

## 2021-05-19 DIAGNOSIS — S0990XA Unspecified injury of head, initial encounter: Secondary | ICD-10-CM | POA: Diagnosis not present

## 2021-05-19 DIAGNOSIS — R2681 Unsteadiness on feet: Secondary | ICD-10-CM

## 2021-05-19 DIAGNOSIS — M6281 Muscle weakness (generalized): Secondary | ICD-10-CM

## 2021-05-19 DIAGNOSIS — R519 Headache, unspecified: Secondary | ICD-10-CM | POA: Diagnosis not present

## 2021-05-19 DIAGNOSIS — I10 Essential (primary) hypertension: Secondary | ICD-10-CM | POA: Diagnosis not present

## 2021-05-19 DIAGNOSIS — R296 Repeated falls: Secondary | ICD-10-CM

## 2021-05-19 LAB — CBC
HCT: 33 % — ABNORMAL LOW (ref 36.0–46.0)
Hemoglobin: 11.1 g/dL — ABNORMAL LOW (ref 12.0–15.0)
MCH: 27.1 pg (ref 26.0–34.0)
MCHC: 33.6 g/dL (ref 30.0–36.0)
MCV: 80.5 fL (ref 80.0–100.0)
Platelets: 234 10*3/uL (ref 150–400)
RBC: 4.1 MIL/uL (ref 3.87–5.11)
RDW: 12.6 % (ref 11.5–15.5)
WBC: 12.9 10*3/uL — ABNORMAL HIGH (ref 4.0–10.5)
nRBC: 0 % (ref 0.0–0.2)

## 2021-05-19 LAB — BASIC METABOLIC PANEL
Anion gap: 10 (ref 5–15)
BUN: 13 mg/dL (ref 8–23)
CO2: 24 mmol/L (ref 22–32)
Calcium: 9.2 mg/dL (ref 8.9–10.3)
Chloride: 97 mmol/L — ABNORMAL LOW (ref 98–111)
Creatinine, Ser: 0.7 mg/dL (ref 0.44–1.00)
GFR, Estimated: >60 mL/min (ref >60)
Glucose, Bld: 88 mg/dL (ref 70–99)
Potassium: 3.9 mmol/L (ref 3.5–5.1)
Sodium: 131 mmol/L — ABNORMAL LOW (ref 135–145)

## 2021-05-19 LAB — URINE CULTURE: Culture: >=100000 — AB

## 2021-05-19 NOTE — ED Notes (Signed)
Pt to ED from [facility] for dizziness and high blood pressure. Pts BP at Walk-in was 178/69

## 2021-05-19 NOTE — ED Triage Notes (Signed)
Pt comes into the ED via [facility] clinic c/o dizziness, being off balance, and head injury.  Pt states that she has been having problems with her balance for about a year due to dizziness.  Pt states she was bending over to get cat food when she hit her forehead on the counter.  Pt denies any LOC or blood thinners.  Pt neurologically intact at this time.  Pt is scheduled to have a cardiac cath on Tuesday next week.

## 2021-05-19 NOTE — Therapy (Signed)
[facility] [address], [place], [address] Phone: [phone]   Fax:  [phone]  Physical Therapy Treatment  Patient Details  Name: Katie Buckley MRN: [id number] Date of Birth: 02-19-1951 Referring Provider (PT): Katie Buckley, [place]   Encounter Date: 05/19/2021   PT End of Session - 05/19/21 1647     Visit Number 22    Number of Visits 56    Date for PT Re-Evaluation 06/28/21    Authorization Type [employer]: (Reporting period from 05/10/2021)    Progress Note Due on Visit 30    PT Start Time 1600    PT Stop Time 1620    PT Time Calculation (min) 20 min    Equipment Utilized During Treatment Gait belt    Activity Tolerance Patient tolerated treatment well    Behavior During Therapy [facility] for tasks assessed/performed             Past Medical History:  Diagnosis Date   Anxiety    Aortic stenosis, mild    a.) mean gradient 18.7 mmHg on 10/2020 TTE   Depression    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   T2DM (type 2 diabetes mellitus) ([place])     Past Surgical History:  Procedure Laterality Date   ANTERIOR CERVICAL DECOMP/DISCECTOMY FUSION N/A 11/15/2020   Procedure: ANTERIOR CERVICAL DECOMPRESSION/DISCECTOMY FUSION 1 LEVEL C3/4;  Surgeon: Katie Perla, MD;  Location: [facility] ORS;  Service: Neurosurgery;  Laterality: N/A;   AUGMENTATION MAMMAPLASTY Bilateral    breast implants   Bone Spur  2007   foot   CARDIAC CATHETERIZATION     Carpal Tunnel Symdrome  2008   as repeated in 2009   CATARACT EXTRACTION W/PHACO Left 01/16/2019    Procedure: CATARACT EXTRACTION PHACO AND INTRAOCULAR LENS PLACEMENT ([place]) LEFT DIABETES VISION BLUE;  Surgeon: Katie Meiers, MD;  Location: [facility] ORS;  Service: Ophthalmology;  Laterality: Left;  [place]  00:47 CDE 6.62 Fluid pack lot # [id number]   CATARACT EXTRACTION W/PHACO Right 02/06/2019   Procedure: CATARACT EXTRACTION PHACO AND INTRAOCULAR LENS PLACEMENT (IOC);  Surgeon: Katie Meiers, MD;  Location: [facility] ORS;  Service: Ophthalmology;  Laterality: Right;  [place] 00:54.6 CDE 6.52 FLUID PACK LOT # [id number]   CHOLECYSTECTOMY  2007   COLONOSCOPY WITH PROPOFOL N/A 11/17/2015   Procedure: COLONOSCOPY WITH PROPOFOL;  Surgeon: Katie Silvas, MD;  Location: [facility] ENDOSCOPY;  Service: Endoscopy;  Laterality: N/A;   ESOPHAGOGASTRODUODENOSCOPY (EGD) WITH PROPOFOL N/A 11/17/2015   Procedure: ESOPHAGOGASTRODUODENOSCOPY (EGD) WITH PROPOFOL;  Surgeon: Katie Silvas, MD;  Location[facility]COPY;  Service: Endoscopy;  Laterality: N/A;   EYE SURGERY Bilateral    cataract extraction   FOOT SURGERY     MANDIBLE SURGERY     NASAL SINUS SURGERY  03/2013   Multile surgery Dr. Carlis Buckley. 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 Tamala Julian   TONSILLECTOMY  1960    Vitals:   05/19/21 1603 05/19/21 1615 05/19/21 1618  BP: (!) 190/64 Marland Kitchen)  184/64 (!) 178/54  Pulse: 68 67 66  SpO2: 98% 98% 98%  First two BP taken with manual adult cuff, most recent taken with automatic adult cuff. All taken seated.    Subjective Assessment - 05/19/21 1603     Subjective Patient reports she feels very off today and unsteady. States she went to urgent care yesterday for symptoms of sinus infection and UTI. The sinus infection has been going on for a while but UTI symptoms started over the weekend. She was started on an antibiotic and the UTI was confirmed. She started taking her antibiotic yesterday. She has not noticed any rashes. She states she has a little headache but does not feel it is related to  her neck. Her BP was running high yesterday because she hadn't taken her BP meds yet. She has already taken them today. Rates headache 3/10. Patient states something stressfull did happen today where her daughter in rehab was sick and went to the doctor and was unable to be seen being told the doctor was not there and there is no one to see her so she had to be rescheduled. This was upsetting to Katie Buckley (currnet patient). She states she is having difficulty keeping her eyes open today. Her husband drove her today.    Pertinent History Patient is a 70 year old female who presents to outpatient physical therapy with a referral for s/p cervical spinal fusion (ANTERIOR CERVICAL DECOMPRESSION/DISCECTOMY FUSION 1 LEVEL C3/4, 11/15/2020), cervicalgia, multifactoral causes for imbalance, cervical myelopathy. This patient's chief complaints consist of imbalance, history of falls, neck pain, and neck hypomobility, leading to the following functional deficits: difficulty with walking, stairs, lifting items including groceries, balance, bending over, moving her neck, performing ADL's such as getting dressed, showering, bathing. Relevant past medical history and comorbidities include anxiety, aortic stenosis, depression, skin cancer, GERD, heart murmur, hyperchloremia, hypertension, neuropathy, skin cancer, type II diabetes mellitus, asthma, anxiety, left ventricular hypertrophy, diabetic neuropathy and per patient report unexplained weight loss (25-35lbs in past year) and a heart murmur. Patient denies history of stroke, seizure, lung problems, bowel or bladder changes, groin numbness or tingling.    Limitations Lifting;Walking;Standing;House hold activities;Other (comment)   walking, stairs, lifting items including groceries, balance, bending over, moving her neck, performing ADL's such as getting dressed, showering, bathing.   Patient Stated Goals "build up body strength and try to get imbalance under control"    Currently  in Pain? Yes    Pain Score 3     Pain Onset More than a month ago              OBJECTIVE  VITALS (taken in seated position with back supported and both feet on floor after sitting quietly at left arm).  BP 190/64 mmHg, HR 68 bpm, SpO2 98%. Adult manual cuff.  BP 184/64 mmHg, HR 67 bpm, SpO2 89%, Adult manual cuff BP 178/54 mmHg, HR 67 bpm, SpO2 98%, Adult automatic cuff.   OBSERVATION Patient more unsteady on feet than usual and veers outside of path, needing gentle guidance from PT to bring her back. She has difficulty keeping eyes open and requires increased processing time for conversations. Skin appears [facility] for usual presentation (very pale) where visualized.    PT Education - 05/19/21 1639     Education Details BP readings with sympotms and reccomendation to seek additional care tonight at walk in clinic or ED.    Person(s) Educated Patient;Spouse    Methods Explanation    Comprehension Verbalized understanding  PT Short Term Goals - 02/17/21 1619       PT SHORT TERM GOAL #1   Title Patient will be independent with inital home exercise program to improve strength/mobility/balance and functional independence with ADLs.    Baseline HEP to be provided at 2nd visit    Time 3    Period Weeks    Status Achieved    Target Date 02/03/21               PT Long Term Goals - 05/10/21 1728       PT LONG TERM GOAL #1   Title Patient will be independent with long term home exercise program to improve strength/mobility/balance and functional independence with ADLs.    Baseline Patient reports HEP compliance (03/03/2021); continues to perform HEP (04/05/2021); continues to perform HEP (05/10/2021);    Time 12    Period Weeks    Status Partially Met   TARGET DATE FOR ALL LONG TERM GOALS: 04/06/2021. UNMET GOALS UPDATED TO: 06/28/2021     PT LONG TERM GOAL #2   Title Improve cervical AROM rotation to equal or greater than 60 degrees each direction to improve  ability to check blind spot when driving, veiw surroundings, and allow patient to complete valued activities with less difficulty.    Baseline 30 L, 40 R (01/12/2021); 65 L, 71 R. Patient reports pain with L rotation (03/03/2021);  R = 63, L = 56 ipsilateral tension/neck pain (04/05/2021); R = 63, L = 57 pain at left neck with left rotation (05/10/2021);    Time 12    Period Weeks    Status Partially Met      PT LONG TERM GOAL #3   Title Patient will score equal or greater than 25/30 on Functional Gait Assessment to demonstrate reduction in fall risk to low fall risk classification.    Baseline 11/30 (01/12/2021); 19/30 (03/03/2021); 22/30 (04/05/2021); 21/30 (05/10/2021);    Time 12    Period Weeks    Status Partially Met      PT LONG TERM GOAL #4   Title Pt will increase LE strength of by at least 1/2 MMT grade in order to demonstrate improvement in strength, function, and general mobility    Baseline BIL hip flexion 3+, knee extension 4+ (01/13/2021); BIL hip flexion 3+, BIL knee extension 5/5 (03/03/2021); continues to improve with most motions - see objective exam (04/05/2021); Met except R hip abduction continues to be 4/5 since last meausrement (05/10/2021);    Time 12    Period Weeks    Status Achieved      PT LONG TERM GOAL #5   Title Demonstrate improved FOTO score to 53 or greater to demonstrate improvement in overall condition and self-reported functional ability    Baseline 45 (01/10/2021); 48 (03/03/2021); 51 (04/05/2021); 55 (05/10/2021);    Time 12    Period Weeks    Status Achieved                   Plan - 05/19/21 1646     Clinical Impression Statement Patient arrived reporting symptoms consistent with a current episode of increased balance issues, feeling outside of her body, and difficulty keeping eyes open. She also reports a headache. Patient brought by her husband for safety due to feeling off today. She also reports symptoms of abnormal depth perception including  bumping her head on the cabinet when trying to get pans out. Patient clearly off balance in clinic per observation and needs  increased processing time during conversation. She walked far outside of straight path when ambulating from clinic to vehicle with SBA from PT who helped guide her back from veering off. Patient found to have significantly elevated systolic blood pressure and PT recommended she seek care this evening at walk in clinic or ED due to combination of elevated BP, headache, and exacerbated other symptoms. She and her husband agreed. Patient did not participate in PT treatment this session due to vitals and symptoms being concerning. Patient has started new antibiotic for UTI and sinus infection yesterday which may be a factor. When medically stable, patient would benefit from continued management of limiting condition by skilled physical therapist to address remaining impairments and functional limitations to work towards stated goals and return to PLOF or maximal functional independence.    Personal Factors and Comorbidities Comorbidity 3+;Past/Current Experience;Time since onset of injury/illness/exacerbation    Comorbidities anxiety, aortic stenosis, depression, skin cancer, GERD, heart murmur, hyperchloremia, hypertension, neuropathy, skin cancer, type II diabetes mellitus, asthma, anxiety, left ventricular hypertrophy, diabetic neuropathy and per patient report unexplained weight loss (25-35lbs in past year) and a heart murmur    Examination-Activity Limitations Bathing;Bend;Hygiene/Grooming;Dressing;Carry;Lift;Locomotion Level;Stairs;Squat;Stand;Transfers;Other   walking, stairs, lifting items including groceries, balance, bending over, moving her neck, performing ADL's such as getting dressed, showering, bathing.   Examination-Participation Restrictions Driving;Cleaning;Interpersonal Relationship;Yard Work;Community Activity;Laundry;Shop    Stability/Clinical Decision Making  Unstable/Unpredictable    Rehab Potential Fair    PT Frequency 2x / week    PT Duration 12 weeks    PT Treatment/Interventions Cryotherapy;ADLs/Self Care Home Management;Moist Heat;Gait training;Stair training;Therapeutic activities;Functional mobility training;Therapeutic exercise;Balance training;Neuromuscular re-education;Patient/family education;Manual techniques;Passive range of motion;Vestibular;Dry needling;Biofeedback;Canalith Repostioning;DME Instruction    PT Next Visit Plan Progress vestibular, strength, and balance activities as tolerated.    PT Home Exercise Plan Medbridge Access Code: [email or web address]    Consulted and Agree with Plan of Care Patient             Patient will benefit from skilled therapeutic intervention in order to improve the following deficits and impairments:  Impaired perceived functional ability, Abnormal gait, Decreased activity tolerance, Decreased range of motion, Decreased strength, Dizziness, Hypomobility, Impaired sensation, Pain, Decreased balance, Decreased mobility, Difficulty walking, Impaired UE functional use  Visit Diagnosis: Unsteadiness on feet  Repeated falls  Cervicalgia  Dizziness and giddiness  Muscle weakness (generalized)     Problem List Patient Active Problem List   Diagnosis Date Noted   Cervical myelopathy ([place]) 11/15/2020   LVH (left ventricular hypertrophy) 10/14/2020   COVID-19 05/19/2020   MRSA cellulitis 04/19/2018   Decreased libido 02/04/2018   Diabetes mellitus with neuropathy ([place]) 04/20/2017   H/O adenomatous polyp of colon 08/20/2015   Rupture of implant of right breast 05/18/2015   Anxiety disorder 04/26/2015   Allergic rhinitis 04/21/2015   Anemia 04/21/2015   Asthma 04/21/2015   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/21/2015   Chronic infection of sinus 04/21/2015   Depression 04/21/2015   GERD (gastroesophageal reflux disease) 04/21/2015   Headache 04/21/2015   Menopausal symptoms 04/21/2015   Obesity 04/21/2015    Palpitations 04/21/2015   Vitamin B12 deficiency 04/21/2015   Aortic stenosis 54/02/8118   Diastolic dysfunction 14/78/2956   Diabetes mellitus out of control 06/12/2006   Barrett esophagus 06/13/2003   Essential hypertension 06/12/1998   Genital herpes 06/12/1998   Hyperlipidemia, mixed 06/12/1998    Everlean Alstrom. Graylon Good, PT, DPT 05/19/21, 4:48 PM   [facility] [address]  [place], [place], [address] Phone: [phone]   Fax:  [phone]  Name: Katie Buckley MRN: [id number] Date of Birth: Oct 14, 1950

## 2021-05-23 ENCOUNTER — Other Ambulatory Visit: Payer: Self-pay | Admitting: Family Medicine

## 2021-05-23 NOTE — Telephone Encounter (Signed)
LOV: 03/25/2021  NOV: 05/30/2021   Thanks,   -Mickel Baas

## 2021-05-23 NOTE — Telephone Encounter (Signed)
Requested medications are due for refill today.  yes  Requested medications are on the active medications list.  yes  Last refill. 03/03/2021  Future visit scheduled.   yes  Notes to clinic.  Medication not delegated.    Requested Prescriptions  Pending Prescriptions Disp Refills   butalbital-acetaminophen-caffeine (FIORICET) 50-325-40 MG tablet [Pharmacy Med Name: BUTALB-ACETAMIN-CAFF 50-325-40] 30 tablet 1    Sig: TAKE 1 TABLET BY MOUTH EVERY 6 HOURS AS NEEDED FOR HEADACHE     Not Delegated - Analgesics:  Non-Opioid Analgesic Combinations Failed - 05/23/2021  1:40 PM      Failed - This refill cannot be delegated      Passed - Valid encounter within last 12 months    Recent Outpatient Visits           1 month ago Type 2 diabetes mellitus with diabetic neuropathy, without long-term current use of insulin ([place])   [facility] [name], MD   3 months ago Uncontrolled type 2 diabetes mellitus with diabetic neuropathy, without long-term current use of insulin ([place])   [facility] [name], MD   6 months ago Uncontrolled type 2 diabetes mellitus with diabetic neuropathy, without long-term current use of insulin [facility])   [facility] [name], MD   8 months ago Memory deficit   [facility] [name], MD   1 year ago Cough   [facility] [name], MD       Future Appointments             In 1 week Fisher, Kirstie Peri, MD [facility] [place]

## 2021-05-24 ENCOUNTER — Other Ambulatory Visit: Payer: Self-pay

## 2021-05-24 ENCOUNTER — Encounter: Payer: PPO | Admitting: Physical Therapy

## 2021-05-24 ENCOUNTER — Encounter
Admission: RE | Disposition: A | Discharge status: Home / Self Care | Payer: Self-pay | Source: Home / Self Care | Attending: Internal Medicine

## 2021-05-24 ENCOUNTER — Ambulatory Visit
Admission: RE | Admit: 2021-05-24 | Discharge: 2021-05-24 | Disposition: A | Discharge status: Home / Self Care | Payer: PPO | Attending: Internal Medicine | Admitting: Internal Medicine

## 2021-05-24 ENCOUNTER — Encounter: Payer: Self-pay | Admitting: Internal Medicine

## 2021-05-24 DIAGNOSIS — R0602 Shortness of breath: Secondary | ICD-10-CM | POA: Diagnosis not present

## 2021-05-24 DIAGNOSIS — I25110 Atherosclerotic heart disease of native coronary artery with unstable angina pectoris: Secondary | ICD-10-CM | POA: Insufficient documentation

## 2021-05-24 DIAGNOSIS — I209 Angina pectoris, unspecified: Secondary | ICD-10-CM | POA: Diagnosis not present

## 2021-05-24 DIAGNOSIS — I272 Pulmonary hypertension, unspecified: Secondary | ICD-10-CM | POA: Diagnosis not present

## 2021-05-24 DIAGNOSIS — I352 Nonrheumatic aortic (valve) stenosis with insufficiency: Secondary | ICD-10-CM | POA: Diagnosis not present

## 2021-05-24 DIAGNOSIS — I200 Unstable angina: Secondary | ICD-10-CM

## 2021-05-24 DIAGNOSIS — I350 Nonrheumatic aortic (valve) stenosis: Secondary | ICD-10-CM

## 2021-05-24 DIAGNOSIS — R9430 Abnormal result of cardiovascular function study, unspecified: Secondary | ICD-10-CM

## 2021-05-24 DIAGNOSIS — I5023 Acute on chronic systolic (congestive) heart failure: Secondary | ICD-10-CM | POA: Diagnosis not present

## 2021-05-24 HISTORY — PX: CORONARY PRESSURE/FFR STUDY: CATH118243

## 2021-05-24 HISTORY — PX: RIGHT/LEFT HEART CATH AND CORONARY ANGIOGRAPHY: CATH118266

## 2021-05-24 HISTORY — PX: INTRAVASCULAR PRESSURE WIRE/FFR STUDY: CATH118243

## 2021-05-24 HISTORY — DX: Dizziness and giddiness: R42

## 2021-05-24 LAB — GLUCOSE, CAPILLARY: Glucose-Capillary: 143 mg/dL — ABNORMAL HIGH (ref 70–99)

## 2021-05-24 LAB — POCT ACTIVATED CLOTTING TIME: Activated Clotting Time: 221 seconds

## 2021-05-24 SURGERY — RIGHT/LEFT HEART CATH AND CORONARY ANGIOGRAPHY
Anesthesia: Moderate Sedation

## 2021-05-24 MED ORDER — LIDOCAINE HCL (PF) 1 % IJ SOLN
INTRAMUSCULAR | Status: DC | PRN
  Administered 2021-05-24: 30 mL

## 2021-05-24 MED ORDER — SODIUM CHLORIDE 0.9 % WEIGHT BASED INFUSION
3.0000 mL/kg/h | INTRAVENOUS | Status: AC
  Administered 2021-05-24: 3 mL/kg/h via INTRAVENOUS

## 2021-05-24 MED ORDER — HEPARIN (PORCINE) IN NACL 1000-0.9 UT/500ML-% IV SOLN
INTRAVENOUS | Status: AC
  Filled 2021-05-24: qty 1000

## 2021-05-24 MED ORDER — SODIUM CHLORIDE 0.9 % IV SOLN: 250.0000 mL | INTRAVENOUS | Status: DC | PRN

## 2021-05-24 MED ORDER — LIDOCAINE HCL 1 % IJ SOLN
INTRAMUSCULAR | Status: AC
  Filled 2021-05-24: qty 20

## 2021-05-24 MED ORDER — LABETALOL HCL 5 MG/ML IV SOLN: 10.0000 mg | INTRAVENOUS | Status: DC | PRN

## 2021-05-24 MED ORDER — SODIUM CHLORIDE 0.9 % WEIGHT BASED INFUSION: 1.0000 mL/kg/h | INTRAVENOUS | Status: DC

## 2021-05-24 MED ORDER — HYDRALAZINE HCL 20 MG/ML IJ SOLN: 10.0000 mg | INTRAMUSCULAR | Status: DC | PRN

## 2021-05-24 MED ORDER — HEPARIN SODIUM (PORCINE) 1000 UNIT/ML IJ SOLN
INTRAMUSCULAR | Status: AC
  Filled 2021-05-24: qty 10

## 2021-05-24 MED ORDER — ASPIRIN 81 MG PO CHEW: 81.0000 mg | CHEWABLE_TABLET | Freq: Every day | ORAL | Status: DC

## 2021-05-24 MED ORDER — SODIUM CHLORIDE 0.9% FLUSH: 3.0000 mL | Freq: Two times a day (BID) | INTRAVENOUS | Status: DC

## 2021-05-24 MED ORDER — HEPARIN (PORCINE) IN NACL 1000-0.9 UT/500ML-% IV SOLN
INTRAVENOUS | Status: DC | PRN
  Administered 2021-05-24 (×2): 500 mL

## 2021-05-24 MED ORDER — SODIUM CHLORIDE 0.9% FLUSH: 3.0000 mL | INTRAVENOUS | Status: DC | PRN

## 2021-05-24 MED ORDER — HEPARIN SODIUM (PORCINE) 1000 UNIT/ML IJ SOLN
INTRAMUSCULAR | Status: DC | PRN
  Administered 2021-05-24: 2000 [IU] via INTRAVENOUS
  Administered 2021-05-24: 4000 [IU] via INTRAVENOUS

## 2021-05-24 MED ORDER — ACETAMINOPHEN 325 MG PO TABS
ORAL_TABLET | ORAL | Status: AC
  Filled 2021-05-24: qty 2

## 2021-05-24 MED ORDER — ONDANSETRON HCL 4 MG/2ML IJ SOLN: 4.0000 mg | Freq: Four times a day (QID) | INTRAMUSCULAR | Status: DC | PRN

## 2021-05-24 MED ORDER — ASPIRIN 81 MG PO CHEW
CHEWABLE_TABLET | ORAL | Status: AC
  Administered 2021-05-24: 81 mg via ORAL
  Filled 2021-05-24: qty 1

## 2021-05-24 MED ORDER — MIDAZOLAM HCL 2 MG/2ML IJ SOLN
INTRAMUSCULAR | Status: AC
  Filled 2021-05-24: qty 2

## 2021-05-24 MED ORDER — IOHEXOL 300 MG/ML  SOLN
INTRAMUSCULAR | Status: DC | PRN
  Administered 2021-05-24: 315 mL

## 2021-05-24 MED ORDER — MIDAZOLAM HCL 2 MG/2ML IJ SOLN
INTRAMUSCULAR | Status: DC | PRN
  Administered 2021-05-24 (×2): .5 mg via INTRAVENOUS
  Administered 2021-05-24: 1 mg via INTRAVENOUS

## 2021-05-24 MED ORDER — FENTANYL CITRATE (PF) 100 MCG/2ML IJ SOLN
INTRAMUSCULAR | Status: AC
  Filled 2021-05-24: qty 2

## 2021-05-24 MED ORDER — ASPIRIN 81 MG PO CHEW
81.0000 mg | CHEWABLE_TABLET | ORAL | Status: AC
  Administered 2021-05-24: 81 mg via ORAL

## 2021-05-24 MED ORDER — ASPIRIN 81 MG PO CHEW
CHEWABLE_TABLET | ORAL | Status: AC
  Filled 2021-05-24: qty 1

## 2021-05-24 MED ORDER — ACETAMINOPHEN 325 MG PO TABS
650.0000 mg | ORAL_TABLET | ORAL | Status: DC | PRN
  Administered 2021-05-24: 650 mg via ORAL

## 2021-05-24 MED ORDER — FENTANYL CITRATE (PF) 100 MCG/2ML IJ SOLN
INTRAMUSCULAR | Status: DC | PRN
  Administered 2021-05-24 (×2): 25 ug via INTRAVENOUS
  Administered 2021-05-24: 50 ug via INTRAVENOUS

## 2021-05-24 SURGICAL SUPPLY — 18 items
CATH INFINITI 5FR MULTPACK ANG (CATHETERS) ×2 IMPLANT
CATH SWAN GANZ 7F STRAIGHT (CATHETERS) ×2 IMPLANT
CATH VISTA GUIDE 6FR JL4 (CATHETERS) ×2 IMPLANT
CATH VISTA GUIDE 6FR XB3 (CATHETERS) ×2 IMPLANT
CATH VISTA GUIDE 6FR XBLAD3.5 (CATHETERS) ×2 IMPLANT
DEVICE CLOSURE MYNXGRIP 6/7F (Vascular Products) ×4 IMPLANT
GUIDEWIRE PRESS OMNI 185 ST (WIRE) ×2 IMPLANT
KIT ENCORE 26 ADVANTAGE (KITS) ×2 IMPLANT
NDL PERC 18GX7CM (NEEDLE) IMPLANT
NEEDLE PERC 18GX7CM (NEEDLE) ×3 IMPLANT
PACK CARDIAC CATH (CUSTOM PROCEDURE TRAY) ×3 IMPLANT
PROTECTION STATION PRESSURIZED (MISCELLANEOUS) ×3
SET ATX SIMPLICITY (MISCELLANEOUS) ×2 IMPLANT
SHEATH AVANTI 5FR X 11CM (SHEATH) ×2 IMPLANT
SHEATH AVANTI 6FR X 11CM (SHEATH) ×2 IMPLANT
SHEATH BRITE TIP 7FRX11 (SHEATH) ×2 IMPLANT
STATION PROTECTION PRESSURIZED (MISCELLANEOUS) IMPLANT
WIRE GUIDERIGHT .035X150 (WIRE) ×2 IMPLANT

## 2021-05-25 ENCOUNTER — Encounter: Payer: Self-pay | Admitting: Internal Medicine

## 2021-05-25 LAB — POCT ACTIVATED CLOTTING TIME: Activated Clotting Time: 263 seconds

## 2021-05-26 ENCOUNTER — Ambulatory Visit: Payer: PPO | Admitting: Physical Therapy

## 2021-05-29 ENCOUNTER — Other Ambulatory Visit: Payer: Self-pay | Admitting: Family Medicine

## 2021-05-29 DIAGNOSIS — E1140 Type 2 diabetes mellitus with diabetic neuropathy, unspecified: Secondary | ICD-10-CM

## 2021-05-30 ENCOUNTER — Ambulatory Visit: Payer: PPO | Admitting: Family Medicine

## 2021-05-30 DIAGNOSIS — R011 Cardiac murmur, unspecified: Secondary | ICD-10-CM | POA: Diagnosis not present

## 2021-05-30 DIAGNOSIS — E1165 Type 2 diabetes mellitus with hyperglycemia: Secondary | ICD-10-CM | POA: Diagnosis not present

## 2021-05-30 DIAGNOSIS — E782 Mixed hyperlipidemia: Secondary | ICD-10-CM | POA: Diagnosis not present

## 2021-05-30 DIAGNOSIS — Z952 Presence of prosthetic heart valve: Secondary | ICD-10-CM | POA: Diagnosis not present

## 2021-05-30 DIAGNOSIS — I519 Heart disease, unspecified: Secondary | ICD-10-CM | POA: Diagnosis not present

## 2021-05-30 DIAGNOSIS — E669 Obesity, unspecified: Secondary | ICD-10-CM | POA: Diagnosis not present

## 2021-05-30 DIAGNOSIS — R0609 Other forms of dyspnea: Secondary | ICD-10-CM | POA: Diagnosis not present

## 2021-05-30 DIAGNOSIS — I739 Peripheral vascular disease, unspecified: Secondary | ICD-10-CM | POA: Diagnosis not present

## 2021-05-30 DIAGNOSIS — K219 Gastro-esophageal reflux disease without esophagitis: Secondary | ICD-10-CM | POA: Diagnosis not present

## 2021-05-30 DIAGNOSIS — I350 Nonrheumatic aortic (valve) stenosis: Secondary | ICD-10-CM | POA: Diagnosis not present

## 2021-05-30 DIAGNOSIS — I10 Essential (primary) hypertension: Secondary | ICD-10-CM | POA: Diagnosis not present

## 2021-05-30 DIAGNOSIS — R002 Palpitations: Secondary | ICD-10-CM | POA: Diagnosis not present

## 2021-05-31 ENCOUNTER — Ambulatory Visit: Payer: PPO | Admitting: Physical Therapy

## 2021-05-31 ENCOUNTER — Encounter: Payer: Self-pay | Admitting: Physical Therapy

## 2021-05-31 ENCOUNTER — Other Ambulatory Visit: Payer: Self-pay | Admitting: Internal Medicine

## 2021-05-31 DIAGNOSIS — M542 Cervicalgia: Secondary | ICD-10-CM

## 2021-05-31 DIAGNOSIS — R2681 Unsteadiness on feet: Secondary | ICD-10-CM | POA: Diagnosis not present

## 2021-05-31 DIAGNOSIS — M6281 Muscle weakness (generalized): Secondary | ICD-10-CM

## 2021-05-31 DIAGNOSIS — R42 Dizziness and giddiness: Secondary | ICD-10-CM

## 2021-05-31 DIAGNOSIS — I10 Essential (primary) hypertension: Secondary | ICD-10-CM

## 2021-05-31 DIAGNOSIS — I519 Heart disease, unspecified: Secondary | ICD-10-CM

## 2021-05-31 DIAGNOSIS — I350 Nonrheumatic aortic (valve) stenosis: Secondary | ICD-10-CM

## 2021-05-31 DIAGNOSIS — R296 Repeated falls: Secondary | ICD-10-CM

## 2021-05-31 NOTE — Therapy (Signed)
[facility] [address] [place], [address] Phone: [phone]   Fax:  [phone]  Physical Therapy Treatment  Patient Details  Name: Katie Buckley MRN: [id number] Date of Birth: Mar 03, 1951 Referring Provider (PT): Loleta Dicker, [place]   Encounter Date: 05/31/2021   PT End of Session - 05/31/21 1844     Visit Number 23    Number of Visits 38    Date for PT Re-Evaluation 06/28/21    Authorization Type [employer]: (Reporting period from 05/10/2021)    Progress Note Due on Visit 30    PT Start Time 1603    PT Stop Time 1643    PT Time Calculation (min) 40 min    Equipment Utilized During Treatment Gait belt    Activity Tolerance Patient tolerated treatment well    Behavior During Therapy [facility] for tasks assessed/performed             Past Medical History:  Diagnosis Date   Anxiety    Aortic stenosis, mild    a.) mean gradient 18.7 mmHg on 10/2020 TTE   Depression    Dizziness    patient states went to urgent care 05/19/2021 and no medications, then went to [facility] ER but left before being seen.  Did not receive any new medications.   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   T2DM (type 2 diabetes mellitus) ([place])     Past Surgical History:  Procedure Laterality Date   ANTERIOR CERVICAL DECOMP/DISCECTOMY FUSION N/A 11/15/2020   Procedure: ANTERIOR CERVICAL DECOMPRESSION/DISCECTOMY FUSION 1 LEVEL C3/4;  Surgeon: Deetta Perla, MD;  Location: [facility] ORS;  Service: Neurosurgery;  Laterality: N/A;   AUGMENTATION MAMMAPLASTY Bilateral    breast  implants   Bone Spur  2007   foot   CARDIAC CATHETERIZATION     Carpal Tunnel Symdrome  2008   as repeated in 2009   CATARACT EXTRACTION W/PHACO Left 01/16/2019   Procedure: CATARACT EXTRACTION PHACO AND INTRAOCULAR LENS PLACEMENT ([place]) LEFT DIABETES VISION BLUE;  Surgeon: Marchia Meiers, MD;  Location: [facility] ORS;  Service: Ophthalmology;  Laterality: Left;  [place]  00:47 CDE 6.62 Fluid pack lot # [id number]   CATARACT EXTRACTION W/PHACO Right 02/06/2019   Procedure: CATARACT EXTRACTION PHACO AND INTRAOCULAR LENS PLACEMENT ([facility]);  Surgeon: Marchia Meiers, MD;  Location: [facility] ORS;  Service: Ophthalmology;  Laterality: Right;  [place] 00:54.6 CDE 6.52 FLUID PACK LOT # [id number]   CHOLECYSTECTOMY  2007   COLONOSCOPY WITH PROPOFOL N/A 11/17/2015   Procedure: COLONOSCOPY WITH PROPOFOL;  Surgeon: Manya Silvas, MD;  Location: [facility] ENDOSCOPY;  Service: Endoscopy;  Laterality: N/A;   ESOPHAGOGASTRODUODENOSCOPY (EGD) WITH PROPOFOL N/A 11/17/2015   Procedure: ESOPHAGOGASTRODUODENOSCOPY (EGD) WITH PROPOFOL;  Surgeon: Manya Silvas, MD;  Location: [facility] ENDOSCOPY;  Service: Endoscopy;  Laterality: N/A;   EYE SURGERY Bilateral    cataract extraction   FOOT SURGERY     INTRAVASCULAR PRESSURE WIRE/FFR STUDY N/A 05/24/2021   Procedure: INTRAVASCULAR PRESSURE WIRE/FFR STUDY;  Surgeon: Yolonda Kida, MD;  Location:  [place];  Service: Cardiovascular;  Laterality: N/A;   MANDIBLE SURGERY     NASAL SINUS SURGERY  03/2013   Multile surgery Dr. Carlis Abbott. 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 Tamala Julian   RIGHT/LEFT HEART CATH AND CORONARY ANGIOGRAPHY N/A 05/24/2021   Procedure: RIGHT/LEFT HEART CATH AND CORONARY ANGIOGRAPHY;  Surgeon: Yolonda Kida, MD;  Location: [facility];  Service: Cardiovascular;  Laterality: N/A;   TONSILLECTOMY  1960    There were no vitals filed for this visit.   Subjective Assessment - 05/31/21 1607     Subjective Patient  reports she had her heart catheterization and found she may need a valve replacement and one of her arteries is 70% clogged. She has been referred to the heart team at [facility] that will determine what the best course of action is. Review of chart shows heart catheterization shows severe aortic stenosis and notable for mid LAD to distal LAD lesion of 70%. Patient states when she left PT and went to walk in clinic at [place] they sent her to the ED. She waited for hours at the ED and finally left without being seen. She states she was just having one of her dizzy spells. Dr. Clayborn Bigness said she is not in any immediate danger and said she had no activity restrictions. She states it took a few days to recover from the catheterization. She has 1/10 pain in her head and neck after "running around like crazy" today. She states her dizziness is there and it just depends on her movements and where your head is.    Pertinent History Patient is a 70 year old female who presents to outpatient physical therapy with a referral for s/p cervical spinal fusion (ANTERIOR CERVICAL DECOMPRESSION/DISCECTOMY FUSION 1 LEVEL C3/4, 11/15/2020), cervicalgia, multifactoral causes for imbalance, cervical myelopathy. This patient's chief complaints consist of imbalance, history of falls, neck pain, and neck hypomobility, leading to the following functional deficits: difficulty with walking, stairs, lifting items including groceries, balance, bending over, moving her neck, performing ADL's such as getting dressed, showering, bathing. Relevant past medical history and comorbidities include anxiety, aortic stenosis, depression, skin cancer, GERD, heart murmur, hyperchloremia, hypertension, neuropathy, skin cancer, type II diabetes mellitus, asthma, anxiety, left ventricular hypertrophy, diabetic neuropathy and per patient report unexplained weight loss (25-35lbs in past year) and a heart murmur. Patient denies history of stroke, seizure, lung problems,  bowel or bladder changes, groin numbness or tingling.    Limitations Lifting;Walking;Standing;House hold activities;Other (comment)   walking, stairs, lifting items including groceries, balance, bending over, moving her neck, performing ADL's such as getting dressed, showering, bathing.   Patient Stated Goals "build up body strength and try to get imbalance under control"    Currently in Pain? Yes    Pain Score 1     Pain Onset --                TREATMENT:    Neuromuscular Re-education: to improve, balance, postural strength, muscle activation patterns, and stabilization strength required for functional activities: - standing (self-selected stance) VORx1 exercise 3x60 seconds compliant surface. SBA - standing roll on the wall, 1x5 reps of 2 rolls each way, SBA for safety. Touchdown on wall for balance when needed. Keeping eyes open and stopping at each end to focus on a letter across the hall. Had a wave of nausea (states this happens at home as well).  - static balance: standing on airex  with eyes closed and touchdown UE support as needed, SBA, 3x60 seconds.  - walking backwards, 4x30 feet with CGA - min A (loses balance to the back and needs breaks at times) - forwards walking with horizontal head turns, 4x30 feet with CGA - ambulation ~ 100 feet to vehicle with CGA   Pt required multimodal cuing for proper technique and to facilitate improved neuromuscular control, strength, range of motion, and functional ability resulting in improved performance. Patient required Supervision - min A for safety.    HOME EXERCISE PROGRAM Access Code: [email or web address] URL: [email or web address] Date: 05/11/2021 Prepared by: Rosita Kea   Exercises Seated Gaze Stabilization with Head Rotation - 3 x daily - 3 sets - 30 seconds time Seated Cervical Retraction - 3 x daily - 1 sets - 20 reps - 1 second hold Supine Cervical Rotation AROM on Pillow - 1-2 x daily - 1 sets - 20 reps - 2 seconds  hold Supine Chin Tuck - 1 x daily - 1 sets - 20 reps - 5 seconds hold      PT Education - 05/31/21 1844     Education Details form/technique with exercise    Person(s) Educated Patient    Methods Explanation;Demonstration;Tactile cues;Verbal cues    Comprehension Verbalized understanding;Verbal cues required;Returned demonstration;Tactile cues required;Need further instruction              PT Short Term Goals - 02/17/21 1619       PT SHORT TERM GOAL #1   Title Patient will be independent with inital home exercise program to improve strength/mobility/balance and functional independence with ADLs.    Baseline HEP to be provided at 2nd visit    Time 3    Period Weeks    Status Achieved    Target Date 02/03/21               PT Long Term Goals - 05/10/21 1728       PT LONG TERM GOAL #1   Title Patient will be independent with long term home exercise program to improve strength/mobility/balance and functional independence with ADLs.    Baseline Patient reports HEP compliance (03/03/2021); continues to perform HEP (04/05/2021); continues to perform HEP (05/10/2021);    Time 12    Period Weeks    Status Partially Met   TARGET DATE FOR ALL LONG TERM GOALS: 04/06/2021. UNMET GOALS UPDATED TO: 06/28/2021     PT LONG TERM GOAL #2   Title Improve cervical AROM rotation to equal or greater than 60 degrees each direction to improve ability to check blind spot when driving, veiw surroundings, and allow patient to complete valued activities with less difficulty.    Baseline 30 L, 40 R (01/12/2021); 65 L, 71 R. Patient reports pain with L rotation (03/03/2021);  R = 63, L = 56 ipsilateral tension/neck pain (04/05/2021); R = 63, L = 57 pain at left neck with left rotation (05/10/2021);    Time 12    Period Weeks    Status Partially Met      PT LONG TERM GOAL #3   Title Patient will score equal or greater than 25/30 on Functional Gait Assessment to demonstrate reduction in fall risk to  low fall risk classification.    Baseline 11/30 (01/12/2021); 19/30 (03/03/2021); 22/30 (04/05/2021); 21/30 (05/10/2021);    Time 12    Period Weeks    Status Partially Met      PT LONG TERM GOAL #4   Title Pt will increase LE strength  of by at least 1/2 MMT grade in order to demonstrate improvement in strength, function, and general mobility    Baseline BIL hip flexion 3+, knee extension 4+ (01/13/2021); BIL hip flexion 3+, BIL knee extension 5/5 (03/03/2021); continues to improve with most motions - see objective exam (04/05/2021); Met except R hip abduction continues to be 4/5 since last meausrement (05/10/2021);    Time 12    Period Weeks    Status Achieved      PT LONG TERM GOAL #5   Title Demonstrate improved FOTO score to 53 or greater to demonstrate improvement in overall condition and self-reported functional ability    Baseline 45 (01/10/2021); 48 (03/03/2021); 51 (04/05/2021); 55 (05/10/2021);    Time 12    Period Weeks    Status Achieved                   Plan - 05/31/21 1843     Clinical Impression Statement Patient returns to PT after cardiac catheterization that showed severe aortic valve stenosis and mid LAD to distal LAD lesion of 70% but was informed by MD she does not have imminent danger and he did not give her any activity restrictions. Continued with balance exercises that are low intensity this session. Patient had a hard time keeping eyes open today and was more symptomatic with unsteadiness that usual today but not as bad as some days. She did complain of some nausea and gastric reflux symptoms at times but wanted to continue. Patient's BP was good in the office yesterday and was not checked today. Patient continues to have imbalance and vestibular disturbance that negatively affects her safety and function. Patient would benefit from continued management of limiting condition by skilled physical therapist to address remaining impairments and functional limitations to  work towards stated goals and return to PLOF or maximal functional independence.    Personal Factors and Comorbidities Comorbidity 3+;Past/Current Experience;Time since onset of injury/illness/exacerbation    Comorbidities anxiety, aortic stenosis, depression, skin cancer, GERD, heart murmur, hyperchloremia, hypertension, neuropathy, skin cancer, type II diabetes mellitus, asthma, anxiety, left ventricular hypertrophy, diabetic neuropathy and per patient report unexplained weight loss (25-35lbs in past year) and a heart murmur    Examination-Activity Limitations Bathing;Bend;Hygiene/Grooming;Dressing;Carry;Lift;Locomotion Level;Stairs;Squat;Stand;Transfers;Other   walking, stairs, lifting items including groceries, balance, bending over, moving her neck, performing ADL's such as getting dressed, showering, bathing.   Examination-Participation Restrictions Driving;Cleaning;Interpersonal Relationship;Yard Work;Community Activity;Laundry;Shop    Stability/Clinical Decision Making Unstable/Unpredictable    Rehab Potential Fair    PT Frequency 2x / week    PT Duration 12 weeks    PT Treatment/Interventions Cryotherapy;ADLs/Self Care Home Management;Moist Heat;Gait training;Stair training;Therapeutic activities;Functional mobility training;Therapeutic exercise;Balance training;Neuromuscular re-education;Patient/family education;Manual techniques;Passive range of motion;Vestibular;Dry needling;Biofeedback;Canalith Repostioning;DME Instruction    PT Next Visit Plan Progress vestibular, strength, and balance activities as tolerated.    PT Home Exercise Plan Medbridge Access Code: [email or web address]    Consulted and Agree with Plan of Care Patient             Patient will benefit from skilled therapeutic intervention in order to improve the following deficits and impairments:  Impaired perceived functional ability, Abnormal gait, Decreased activity tolerance, Decreased range of motion, Decreased strength, Dizziness,  Hypomobility, Impaired sensation, Pain, Decreased balance, Decreased mobility, Difficulty walking, Impaired UE functional use  Visit Diagnosis: Unsteadiness on feet  Repeated falls  Cervicalgia  Dizziness and giddiness  Muscle weakness (generalized)     Problem List Patient Active Problem List   Diagnosis Date Noted  Cervical myelopathy ([place]) 11/15/2020   LVH (left ventricular hypertrophy) 10/14/2020   COVID-19 05/19/2020   MRSA cellulitis 04/19/2018   Decreased libido 02/04/2018   Diabetes mellitus with neuropathy ([place]) 04/20/2017   H/O adenomatous polyp of colon 08/20/2015   Rupture of implant of right breast 05/18/2015   Anxiety disorder 04/26/2015   Allergic rhinitis 04/21/2015   Anemia 04/21/2015   Asthma 04/21/2015   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/21/2015   Chronic infection of sinus 04/21/2015   Depression 04/21/2015   GERD (gastroesophageal reflux disease) 04/21/2015   Headache 04/21/2015   Menopausal symptoms 04/21/2015   Obesity 04/21/2015   Palpitations 04/21/2015   Vitamin B12 deficiency 04/21/2015   Aortic stenosis 81/06/7508   Diastolic dysfunction 25/85/2778   Diabetes mellitus out of control 06/12/2006   Barrett esophagus 06/13/2003   Essential hypertension 06/12/1998   Genital herpes 06/12/1998   Hyperlipidemia, mixed 06/12/1998    Everlean Alstrom. Graylon Good, PT, DPT 05/31/21, 6:46 PM   [facility] [address] [place], [address] Phone: [phone]   Fax:  [phone]  Name: Katie Buckley MRN: [id number] Date of Birth: 27-May-1951

## 2021-06-02 ENCOUNTER — Ambulatory Visit: Payer: PPO | Admitting: Physical Therapy

## 2021-06-07 ENCOUNTER — Ambulatory Visit: Payer: PPO | Admitting: Physical Therapy

## 2021-06-07 DIAGNOSIS — R42 Dizziness and giddiness: Secondary | ICD-10-CM

## 2021-06-07 DIAGNOSIS — R2681 Unsteadiness on feet: Secondary | ICD-10-CM | POA: Diagnosis not present

## 2021-06-07 DIAGNOSIS — M6281 Muscle weakness (generalized): Secondary | ICD-10-CM

## 2021-06-07 DIAGNOSIS — M542 Cervicalgia: Secondary | ICD-10-CM

## 2021-06-07 DIAGNOSIS — R296 Repeated falls: Secondary | ICD-10-CM

## 2021-06-07 NOTE — Therapy (Signed)
[facility] [address], [place], [address] Phone: [phone]   Fax:  [phone]  Physical Therapy Treatment  Patient Details  Name: Katie Buckley MRN: [id number] Date of Birth: [birth year] Referring Provider (PT): Loleta Dicker, [place]   Encounter Date: 06/07/2021   PT End of Session - 06/07/21 1703     Visit Number 24    Number of Visits 38    Date for PT Re-Evaluation 06/28/21    Authorization Type [employer]: (Reporting period from 05/10/2021)    Progress Note Due on Visit 30    PT Start Time 1603    PT Stop Time 1643    PT Time Calculation (min) 40 min    Equipment Utilized During Treatment Gait belt    Activity Tolerance Patient tolerated treatment well    Behavior During Therapy [facility] for tasks assessed/performed             Past Medical History:  Diagnosis Date   Anxiety    Aortic stenosis, mild    a.) mean gradient 18.7 mmHg on 10/2020 TTE   Depression    Dizziness    patient states went to urgent care 05/19/2021 and no medications, then went to [facility] ER but left before being seen.  Did not receive any new medications.   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   T2DM (type 2 diabetes mellitus) ([place])     Past Surgical History:  Procedure Laterality Date   ANTERIOR CERVICAL DECOMP/DISCECTOMY FUSION N/A 11/15/2020   Procedure: ANTERIOR CERVICAL DECOMPRESSION/DISCECTOMY FUSION 1 LEVEL C3/4;  Surgeon: Deetta Perla, MD;  Location: [facility] ORS;  Service: Neurosurgery;  Laterality: N/A;   AUGMENTATION MAMMAPLASTY Bilateral    breast  implants   Bone Spur  2007   foot   CARDIAC CATHETERIZATION     Carpal Tunnel Symdrome  2008   as repeated in 2009   CATARACT EXTRACTION W/PHACO Left 01/16/2019   Procedure: CATARACT EXTRACTION PHACO AND INTRAOCULAR LENS PLACEMENT ([place]) LEFT DIABETES VISION BLUE;  Surgeon: Marchia Meiers, MD;  Location: [facility] ORS;  Service: Ophthalmology;  Laterality: Left;  [place]  00:47 CDE 6.62 Fluid pack lot # [id number]   CATARACT EXTRACTION W/PHACO Right 02/06/2019   Procedure: CATARACT EXTRACTION PHACO AND INTRAOCULAR LENS PLACEMENT (I[facility]);  Surgeon: Marchia Meiers, MD;  Location: [facility] ORS;  Service: Ophthalmology;  Laterality: Right;  [place] 00:54.6 CDE 6.52 FLUID PACK LOT # [id number]   CHOLECYSTECTOMY  2007   COLONOSCOPY WITH PROPOFOL N/A 11/17/2015   Procedure: COLONOSCOPY WITH PROPOFOL;  Surgeon: Manya Silvas, MD;  Location: [facility] ENDOSCOPY;  Service: Endoscopy;  Laterality: N/A;   ESOPHAGOGASTRODUODENOSCOPY (EGD) WITH PROPOFOL N/A 11/17/2015   Procedure: ESOPHAGOGASTRODUODENOSCOPY (EGD) WITH PROPOFOL;  Surgeon: Manya Silvas, MD;  Location: [facility] ENDOSCOPY;  Service: Endoscopy;  Laterality: N/A;   EYE SURGERY Bilateral    cataract extraction   FOOT SURGERY     INTRAVASCULAR PRESSURE WIRE/FFR STUDY N/A 05/24/2021   Procedure: INTRAVASCULAR PRESSURE WIRE/FFR STUDY;  Surgeon: Yolonda Kida, MD;  Location:  [place] CV LAB;  Service: Cardiovascular;  Laterality: N/A;   MANDIBLE SURGERY     NASAL SINUS SURGERY  03/2013   Multile surgery Dr. Carlis Abbott. 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 Tamala Julian   RIGHT/LEFT HEART CATH AND CORONARY ANGIOGRAPHY N/A 05/24/2021   Procedure: RIGHT/LEFT HEART CATH AND CORONARY ANGIOGRAPHY;  Surgeon: Yolonda Kida, MD;  Location: [facility];  Service: Cardiovascular;  Laterality: N/A;   TONSILLECTOMY  1960    There were no vitals filed for this visit.   Subjective Assessment - 06/07/21 1607     Subjective patient  report she feels well today and not too dizzy. States the only pain she has is her right wrist.    Pertinent History Patient is a 70 year old female who presents to outpatient physical therapy with a referral for s/p cervical spinal fusion (ANTERIOR CERVICAL DECOMPRESSION/DISCECTOMY FUSION 1 LEVEL C3/4, 11/15/2020), cervicalgia, multifactoral causes for imbalance, cervical myelopathy. This patient's chief complaints consist of imbalance, history of falls, neck pain, and neck hypomobility, leading to the following functional deficits: difficulty with walking, stairs, lifting items including groceries, balance, bending over, moving her neck, performing ADL's such as getting dressed, showering, bathing. Relevant past medical history and comorbidities include anxiety, aortic stenosis, depression, skin cancer, GERD, heart murmur, hyperchloremia, hypertension, neuropathy, skin cancer, type II diabetes mellitus, asthma, anxiety, left ventricular hypertrophy, diabetic neuropathy and per patient report unexplained weight loss (25-35lbs in past year) and a heart murmur. Patient denies history of stroke, seizure, lung problems, bowel or bladder changes, groin numbness or tingling.    Limitations Lifting;Walking;Standing;House hold activities;Other (comment)   walking, stairs, lifting items including groceries, balance, bending over, moving her neck, performing ADL's such as getting dressed, showering, bathing.   Patient Stated Goals "build up body strength and try to get imbalance under control"    Currently in Pain? Yes    Pain Score 4              TREATMENT:    Neuromuscular Re-education: to improve, balance, postural strength, muscle activation patterns, and stabilization strength required for functional activities: - standing (self-selected stance) VORx1 exercise 3x60 seconds compliant surface. SBA - standing roll on the wall, 1x5 reps of 2 rolls each way, SBA for safety. Touchdown on wall for balance when  needed. Keeping eyes open and stopping at each end to focus on a letter across the hall. Had a wave of nausea (states this happens at home as well).  - static balance: standing on airex with eyes closed and touchdown UE support as needed, SBA, 3x60 seconds.  - walking backwards, 6x20 feet with CGA - min A (occasional loses balance to the back and needs breaks at times). Added ball toss in front of face after first 2 sets.  - forwards walking with horizontal head turns, 6x20 feet with CGA - standing step strategy in response to PT pushing patient then letting go, 1x10 backwards. 1x2 each lateral and forwards. Patient demonstrates good stepping reflex.  - 365 turns, 1x10 each direction (alternating directions).  - tandem walking with single finger or U UE touchdown support, 1x6 the length of treadmill bar (~5 feet) forwards and backwards. 1x15 feet forwards with both arms out.  - alternating double toe taps on edge of treadmill, 1x10 each side (cuing to not look at feet).  - alternating double heel taps on edge of treadmill, 2x10 each side (cuing to not look at feet).  Pt required multimodal cuing for proper technique and to facilitate improved neuromuscular control, strength, range of motion, and functional ability resulting in improved performance. Patient required Supervision - min A for safety.    HOME EXERCISE PROGRAM Access Code: [email or web address] URL: [email or web address] Date: 05/11/2021 Prepared by: Rosita Kea   Exercises Seated Gaze Stabilization with Head Rotation - 3 x daily - 3 sets - 30 seconds time Seated Cervical Retraction - 3 x daily - 1 sets - 20 reps - 1 second hold Supine Cervical Rotation AROM on Pillow - 1-2 x daily - 1 sets - 20 reps - 2 seconds hold Supine Chin Tuck - 1 x daily - 1 sets - 20 reps - 5 seconds hold     PT Education - 06/07/21 1703     Education Details form/technique with exercise    Person(s) Educated Patient    Methods  Explanation;Demonstration;Tactile cues;Verbal cues    Comprehension Verbalized understanding;Returned demonstration;Verbal cues required;Tactile cues required;Need further instruction              PT Short Term Goals - 02/17/21 1619       PT SHORT TERM GOAL #1   Title Patient will be independent with inital home exercise program to improve strength/mobility/balance and functional independence with ADLs.    Baseline HEP to be provided at 2nd visit    Time 3    Period Weeks    Status Achieved    Target Date 02/03/21               PT Long Term Goals - 05/10/21 1728       PT LONG TERM GOAL #1   Title Patient will be independent with long term home exercise program to improve strength/mobility/balance and functional independence with ADLs.    Baseline Patient reports HEP compliance (03/03/2021); continues to perform HEP (04/05/2021); continues to perform HEP (05/10/2021);    Time 12    Period Weeks    Status Partially Met   TARGET DATE FOR ALL LONG TERM GOALS: 04/06/2021. UNMET GOALS UPDATED TO: 06/28/2021     PT LONG TERM GOAL #2   Title Improve cervical AROM rotation to equal or greater than 60 degrees each direction to improve ability to check blind spot when driving, veiw surroundings, and allow patient to complete valued activities with less difficulty.    Baseline 30 L, 40 R (01/12/2021); 65 L, 71 R. Patient reports pain with L rotation (03/03/2021);  R = 63, L = 56 ipsilateral tension/neck pain (04/05/2021); R = 63, L = 57 pain at left neck with left rotation (05/10/2021);    Time 12    Period Weeks    Status Partially Met      PT LONG TERM GOAL #3   Title Patient will score equal or greater than 25/30 on Functional Gait Assessment to demonstrate reduction in fall risk to low fall risk classification.    Baseline 11/30 (01/12/2021); 19/30 (03/03/2021); 22/30 (04/05/2021); 21/30 (05/10/2021);    Time 12    Period Weeks    Status Partially Met      PT LONG TERM GOAL #4    Title Pt will increase LE strength of by at least 1/2 MMT grade in order to demonstrate improvement in strength, function, and general mobility    Baseline BIL hip flexion 3+, knee extension 4+ (01/13/2021); BIL hip flexion 3+, BIL knee extension 5/5 (03/03/2021); continues to improve with most motions - see objective exam (04/05/2021); Met except R hip abduction continues  to be 4/5 since last meausrement (05/10/2021);    Time 12    Period Weeks    Status Achieved      PT LONG TERM GOAL #5   Title Demonstrate improved FOTO score to 53 or greater to demonstrate improvement in overall condition and self-reported functional ability    Baseline 45 (01/10/2021); 48 (03/03/2021); 51 (04/05/2021); 55 (05/10/2021);    Time 12    Period Weeks    Status Achieved                   Plan - 06/07/21 1703     Clinical Impression Statement Patient tolerated treatment well overall and was having a good day for her balance symptoms. She was able to complete more difficult exercises successfully today, although she continues to feel and demonstrate being off balance compared to PLOF. Patient would benefit from continued management of limiting condition by skilled physical therapist to address remaining impairments and functional limitations to work towards stated goals and return to PLOF or maximal functional independence.    Personal Factors and Comorbidities Comorbidity 3+;Past/Current Experience;Time since onset of injury/illness/exacerbation    Comorbidities anxiety, aortic stenosis, depression, skin cancer, GERD, heart murmur, hyperchloremia, hypertension, neuropathy, skin cancer, type II diabetes mellitus, asthma, anxiety, left ventricular hypertrophy, diabetic neuropathy and per patient report unexplained weight loss (25-35lbs in past year) and a heart murmur    Examination-Activity Limitations Bathing;Bend;Hygiene/Grooming;Dressing;Carry;Lift;Locomotion Level;Stairs;Squat;Stand;Transfers;Other   walking,  stairs, lifting items including groceries, balance, bending over, moving her neck, performing ADL's such as getting dressed, showering, bathing.   Examination-Participation Restrictions Driving;Cleaning;Interpersonal Relationship;Yard Work;Community Activity;Laundry;Shop    Stability/Clinical Decision Making Unstable/Unpredictable    Rehab Potential Fair    PT Frequency 2x / week    PT Duration 12 weeks    PT Treatment/Interventions Cryotherapy;ADLs/Self Care Home Management;Moist Heat;Gait training;Stair training;Therapeutic activities;Functional mobility training;Therapeutic exercise;Balance training;Neuromuscular re-education;Patient/family education;Manual techniques;Passive range of motion;Vestibular;Dry needling;Biofeedback;Canalith Repostioning;DME Instruction    PT Next Visit Plan Progress vestibular, strength, and balance activities as tolerated.    PT Home Exercise Plan Medbridge Access Code: [email or web address]    Consulted and Agree with Plan of Care Patient             Patient will benefit from skilled therapeutic intervention in order to improve the following deficits and impairments:  Impaired perceived functional ability, Abnormal gait, Decreased activity tolerance, Decreased range of motion, Decreased strength, Dizziness, Hypomobility, Impaired sensation, Pain, Decreased balance, Decreased mobility, Difficulty walking, Impaired UE functional use  Visit Diagnosis: Unsteadiness on feet  Repeated falls  Cervicalgia  Dizziness and giddiness  Muscle weakness (generalized)     Problem List Patient Active Problem List   Diagnosis Date Noted   Cervical myelopathy ([place]) 11/15/2020   LVH (left ventricular hypertrophy) 10/14/2020   COVID-19 05/19/2020   MRSA cellulitis 04/19/2018   Decreased libido 02/04/2018   Diabetes mellitus with neuropathy ([place]) 04/20/2017   H/O adenomatous polyp of colon 08/20/2015   Rupture of implant of right breast 05/18/2015   Anxiety disorder  04/26/2015   Allergic rhinitis 04/21/2015   Anemia 04/21/2015   Asthma 04/21/2015   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/21/2015   Chronic infection of sinus 04/21/2015   Depression 04/21/2015   GERD (gastroesophageal reflux disease) 04/21/2015   Headache 04/21/2015   Menopausal symptoms 04/21/2015   Obesity 04/21/2015   Palpitations 04/21/2015   Vitamin B12 deficiency 04/21/2015   Aortic stenosis 54/02/8118   Diastolic dysfunction 14/78/2956   Diabetes mellitus out of control 06/12/2006   Barrett esophagus  06/13/2003   Essential hypertension 06/12/1998   Genital herpes 06/12/1998   Hyperlipidemia, mixed 06/12/1998    Everlean Alstrom. Graylon Good, PT, DPT 06/07/21, 5:05 PM   [facility] [address] [place], [address] Phone: [phone]   Fax:  [phone]  Name: YOCELYN BROCIOUS MRN: [id number] Date of Birth: 11-13-1950

## 2021-06-09 ENCOUNTER — Ambulatory Visit: Payer: PPO | Admitting: Physical Therapy

## 2021-06-09 ENCOUNTER — Encounter: Payer: Self-pay | Admitting: Physical Therapy

## 2021-06-09 DIAGNOSIS — M6281 Muscle weakness (generalized): Secondary | ICD-10-CM

## 2021-06-09 DIAGNOSIS — M542 Cervicalgia: Secondary | ICD-10-CM

## 2021-06-09 DIAGNOSIS — R296 Repeated falls: Secondary | ICD-10-CM

## 2021-06-09 DIAGNOSIS — R42 Dizziness and giddiness: Secondary | ICD-10-CM

## 2021-06-09 DIAGNOSIS — R2681 Unsteadiness on feet: Secondary | ICD-10-CM

## 2021-06-09 NOTE — Therapy (Signed)
[facility] [address] [place], [address] Phone: [phone]   Fax:  [phone]  Physical Therapy Treatment  Patient Details  Name: Katie Buckley MRN: [id number] Date of Birth: 1950/09/22 Referring Provider (PT): Loleta Dicker, [place]   Encounter Date: 06/09/2021   PT End of Session - 06/09/21 1748     Visit Number 25    Number of Visits 59    Date for PT Re-Evaluation 06/28/21    Authorization Type [employer]: (Reporting period from 05/10/2021)    Progress Note Due on Visit 30    PT Start Time 1605    PT Stop Time 1645    PT Time Calculation (min) 40 min    Equipment Utilized During Treatment Gait belt    Activity Tolerance Patient tolerated treatment well    Behavior During Therapy [facility] for tasks assessed/performed             Past Medical History:  Diagnosis Date   Anxiety    Aortic stenosis, mild    a.) mean gradient 18.7 mmHg on 10/2020 TTE   Depression    Dizziness    patient states went to urgent care 05/19/2021 and no medications, then went to [facility] ER but left before being seen.  Did not receive any new medications.   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   T2DM (type 2 diabetes mellitus) ([place])     Past Surgical History:  Procedure Laterality Date   ANTERIOR CERVICAL DECOMP/DISCECTOMY FUSION N/A 11/15/2020   Procedure: ANTERIOR CERVICAL DECOMPRESSION/DISCECTOMY FUSION 1 LEVEL C3/4;  Surgeon: Deetta Perla, MD;  Location: [facility] ORS;  Service: Neurosurgery;  Laterality: N/A;   AUGMENTATION MAMMAPLASTY Bilateral    breast  implants   Bone Spur  2007   foot   CARDIAC CATHETERIZATION     Carpal Tunnel Symdrome  2008   as repeated in 2009   CATARACT EXTRACTION W/PHACO Left 01/16/2019   Procedure: CATARACT EXTRACTION PHACO AND INTRAOCULAR LENS PLACEMENT ([place]) LEFT DIABETES VISION BLUE;  Surgeon: Marchia Meiers, MD;  Location: [facility] ORS;  Service: Ophthalmology;  Laterality: Left;  [place]  00:47 CDE 6.62 Fluid pack lot # [id number]   CATARACT EXTRACTION W/PHACO Right 02/06/2019   Procedure: CATARACT EXTRACTION PHACO AND INTRAOCULAR LENS PLACEMENT (IOC);  Surgeon: Marchia Meiers, MD;  Location: [facility] ORS;  Service: Ophthalmology;  Laterality: Right;  [place] 00:54.6 CDE 6.52 FLUID PACK LOT # [id number]   CHOLECYSTECTOMY  2007   COLONOSCOPY WITH PROPOFOL N/A 11/17/2015   Procedure: COLONOSCOPY WITH PROPOFOL;  Surgeon: Manya Silvas, MD;  Location: [facility] ENDOSCOPY;  Service: Endoscopy;  Laterality: N/A;   ESOPHAGOGASTRODUODENOSCOPY (EGD) WITH PROPOFOL N/A 11/17/2015   Procedure: ESOPHAGOGASTRODUODENOSCOPY (EGD) WITH PROPOFOL;  Surgeon: Manya Silvas, MD;  Location: [facility] ENDOSCOPY;  Service: Endoscopy;  Laterality: N/A;   EYE SURGERY Bilateral    cataract extraction   FOOT SURGERY     INTRAVASCULAR PRESSURE WIRE/FFR STUDY N/A 05/24/2021   Procedure: INTRAVASCULAR PRESSURE WIRE/FFR STUDY;  Surgeon: Yolonda Kida, MD;  Location:  [facility];  Service: Cardiovascular;  Laterality: N/A;   MANDIBLE SURGERY     NASAL SINUS SURGERY  03/2013   Multile surgery Dr. Carlis Abbott. 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 Tamala Julian   RIGHT/LEFT HEART CATH AND CORONARY ANGIOGRAPHY N/A 05/24/2021   Procedure: RIGHT/LEFT HEART CATH AND CORONARY ANGIOGRAPHY;  Surgeon: Yolonda Kida, MD;  Location: [facility];  Service: Cardiovascular;  Laterality: N/A;   TONSILLECTOMY  1960    There were no vitals filed for this visit.   Subjective Assessment - 06/09/21 1607     Subjective Patient  states she is not having a very good day today. She states she has pain in her neck and shoulders and her right wrist of 5/10. It startedyesterday for no apparent reason. She is wearing her wrist/thumb splint since her wrist pain was waking her up. Her balance has been pretty bad yesterday and seems to be okay today but she has not been moving around a lot. She felt okay after last PT session. She has been doing some paperwork that is a little stressfull/frustrating.    Pertinent History Patient is a 70 year old female who presents to outpatient physical therapy with a referral for s/p cervical spinal fusion (ANTERIOR CERVICAL DECOMPRESSION/DISCECTOMY FUSION 1 LEVEL C3/4, 11/15/2020), cervicalgia, multifactoral causes for imbalance, cervical myelopathy. This patient's chief complaints consist of imbalance, history of falls, neck pain, and neck hypomobility, leading to the following functional deficits: difficulty with walking, stairs, lifting items including groceries, balance, bending over, moving her neck, performing ADL's such as getting dressed, showering, bathing. Relevant past medical history and comorbidities include anxiety, aortic stenosis, depression, skin cancer, GERD, heart murmur, hyperchloremia, hypertension, neuropathy, skin cancer, type II diabetes mellitus, asthma, anxiety, left ventricular hypertrophy, diabetic neuropathy and per patient report unexplained weight loss (25-35lbs in past year) and a heart murmur. Patient denies history of stroke, seizure, lung problems, bowel or bladder changes, groin numbness or tingling.    Limitations Lifting;Walking;Standing;House hold activities;Other (comment)   walking, stairs, lifting items including groceries, balance, bending over, moving her neck, performing ADL's such as getting dressed, showering, bathing.   Patient Stated Goals "build up body strength and try to get imbalance under control"    Currently in Pain? Yes    Pain Score 5               TREATMENT:    Neuromuscular Re-education: to improve, balance, postural strength, muscle activation patterns, and stabilization strength required for functional activities: - standing (self-selected stance) VORx1 exercise 3x60 seconds compliant surface. SBA - standing roll on the wall, 1x5 reps of 2 rolls each way, SBA for safety. Touchdown on wall for balance when needed. Keeping eyes open and stopping at each end to focus on a letter across the hall. Had a wave of nausea (states this happens at home as well).  - static balance: standing on airex with eyes closed and touchdown UE support as needed, SBA, 3x60 seconds.  - alternating double toe taps on 8.5 inch step, 1x10 each side (cuing to not look at feet).  - alternating double heel taps on 8.5 inch step, 2x10 each side (cuing to not look at feet). - walking backwards, 6x20 feet with CGA - min A (occasional loses balance to the back and needs breaks at times).  - ambulation ~ 100 feet from clinic to vehicle with SBA for safety.   Manual therapy: to reduce pain  and tissue tension, improve range of motion, neuromodulation, in order to promote improved ability to complete functional activities.  PRONE - CPA grade III-IV along thoracic and upper lumbar spine, grade V at mid thoracic spine (no cavitation).  SIDELYING - rotational gapping mobilization to lumbothoracic spine grade III-V each side (no cavitation).  SEATED  - distraction mobilization grade V at mid thoracic spine (no cavitation).  HOOKLYING  - thoracic AP HVLA thrust with foam roll behind back (no cavitation).    Pt required multimodal cuing for proper technique and to facilitate improved neuromuscular control, strength, range of motion, and functional ability resulting in improved performance. Patient required Supervision - min A for safety.    HOME EXERCISE PROGRAM Access Code: [email or web address] URL: [email or web address] Date: 05/11/2021 Prepared by: Rosita Kea    Exercises Seated Gaze Stabilization with Head Rotation - 3 x daily - 3 sets - 30 seconds time Seated Cervical Retraction - 3 x daily - 1 sets - 20 reps - 1 second hold Supine Cervical Rotation AROM on Pillow - 1-2 x daily - 1 sets - 20 reps - 2 seconds hold Supine Chin Tuck - 1 x daily - 1 sets - 20 reps - 5 seconds hold    PT Education - 06/09/21 1749     Education Details form/technique with exercise    Person(s) Educated Patient    Methods Explanation;Demonstration;Tactile cues;Verbal cues    Comprehension Verbalized understanding;Returned demonstration;Verbal cues required;Tactile cues required;Need further instruction              PT Short Term Goals - 02/17/21 1619       PT SHORT TERM GOAL #1   Title Patient will be independent with inital home exercise program to improve strength/mobility/balance and functional independence with ADLs.    Baseline HEP to be provided at 2nd visit    Time 3    Period Weeks    Status Achieved    Target Date 02/03/21               PT Long Term Goals - 05/10/21 1728       PT LONG TERM GOAL #1   Title Patient will be independent with long term home exercise program to improve strength/mobility/balance and functional independence with ADLs.    Baseline Patient reports HEP compliance (03/03/2021); continues to perform HEP (04/05/2021); continues to perform HEP (05/10/2021);    Time 12    Period Weeks    Status Partially Met   TARGET DATE FOR ALL LONG TERM GOALS: 04/06/2021. UNMET GOALS UPDATED TO: 06/28/2021     PT LONG TERM GOAL #2   Title Improve cervical AROM rotation to equal or greater than 60 degrees each direction to improve ability to check blind spot when driving, veiw surroundings, and allow patient to complete valued activities with less difficulty.    Baseline 30 L, 40 R (01/12/2021); 65 L, 71 R. Patient reports pain with L rotation (03/03/2021);  R = 63, L = 56 ipsilateral tension/neck pain (04/05/2021); R = 63, L = 57 pain at  left neck with left rotation (05/10/2021);    Time 12    Period Weeks    Status Partially Met      PT LONG TERM GOAL #3   Title Patient will score equal or greater than 25/30 on Functional Gait Assessment to demonstrate reduction in fall risk to low fall risk classification.    Baseline 11/30 (01/12/2021); 19/30 (03/03/2021); 22/30 (04/05/2021); 21/30 (05/10/2021);    Time 12  Period Weeks    Status Partially Met      PT LONG TERM GOAL #4   Title Pt will increase LE strength of by at least 1/2 MMT grade in order to demonstrate improvement in strength, function, and general mobility    Baseline BIL hip flexion 3+, knee extension 4+ (01/13/2021); BIL hip flexion 3+, BIL knee extension 5/5 (03/03/2021); continues to improve with most motions - see objective exam (04/05/2021); Met except R hip abduction continues to be 4/5 since last meausrement (05/10/2021);    Time 12    Period Weeks    Status Achieved      PT LONG TERM GOAL #5   Title Demonstrate improved FOTO score to 53 or greater to demonstrate improvement in overall condition and self-reported functional ability    Baseline 45 (01/10/2021); 48 (03/03/2021); 51 (04/05/2021); 55 (05/10/2021);    Time 12    Period Weeks    Status Achieved                   Plan - 06/09/21 1747     Clinical Impression Statement Patient tolerated treatment well overall and reported mild decrease in pain after manual therapy. Grade V mobs and HVLA thrust performed to decrease pain and discomfort after patient reported she though her back would feel better if it were "popped." No cavitation resulted but patient reported feeling less pain after. Patient struggled slightly more with balance activities compared to last PT session but was able to complete all balance tasks. She was walked out to the vehicle for safety due to occasional stumbling in the clinic. Cognition appeared intact and at baseline.  Patient would benefit from continued management of  limiting condition by skilled physical therapist to address remaining impairments and functional limitations to work towards stated goals and return to PLOF or maximal functional independence.    Personal Factors and Comorbidities Comorbidity 3+;Past/Current Experience;Time since onset of injury/illness/exacerbation    Comorbidities anxiety, aortic stenosis, depression, skin cancer, GERD, heart murmur, hyperchloremia, hypertension, neuropathy, skin cancer, type II diabetes mellitus, asthma, anxiety, left ventricular hypertrophy, diabetic neuropathy and per patient report unexplained weight loss (25-35lbs in past year) and a heart murmur    Examination-Activity Limitations Bathing;Bend;Hygiene/Grooming;Dressing;Carry;Lift;Locomotion Level;Stairs;Squat;Stand;Transfers;Other   walking, stairs, lifting items including groceries, balance, bending over, moving her neck, performing ADL's such as getting dressed, showering, bathing.   Examination-Participation Restrictions Driving;Cleaning;Interpersonal Relationship;Yard Work;Community Activity;Laundry;Shop    Stability/Clinical Decision Making Unstable/Unpredictable    Rehab Potential Fair    PT Frequency 2x / week    PT Duration 12 weeks    PT Treatment/Interventions Cryotherapy;ADLs/Self Care Home Management;Moist Heat;Gait training;Stair training;Therapeutic activities;Functional mobility training;Therapeutic exercise;Balance training;Neuromuscular re-education;Patient/family education;Manual techniques;Passive range of motion;Vestibular;Dry needling;Biofeedback;Canalith Repostioning;DME Instruction    PT Next Visit Plan Progress vestibular, strength, and balance activities as tolerated.    PT Home Exercise Plan Medbridge Access Code: [email or web address]    Consulted and Agree with Plan of Care Patient             Patient will benefit from skilled therapeutic intervention in order to improve the following deficits and impairments:  Impaired perceived functional  ability, Abnormal gait, Decreased activity tolerance, Decreased range of motion, Decreased strength, Dizziness, Hypomobility, Impaired sensation, Pain, Decreased balance, Decreased mobility, Difficulty walking, Impaired UE functional use  Visit Diagnosis: Unsteadiness on feet  Repeated falls  Cervicalgia  Dizziness and giddiness  Muscle weakness (generalized)     Problem List Patient Active Problem List   Diagnosis Date Noted  Cervical myelopathy ([place]) 11/15/2020   LVH (left ventricular hypertrophy) 10/14/2020   COVID-19 05/19/2020   MRSA cellulitis 04/19/2018   Decreased libido 02/04/2018   Diabetes mellitus with neuropathy ([place]) 04/20/2017   H/O adenomatous polyp of colon 08/20/2015   Rupture of implant of right breast 05/18/2015   Anxiety disorder 04/26/2015   Allergic rhinitis 04/21/2015   Anemia 04/21/2015   Asthma 04/21/2015   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/21/2015   Chronic infection of sinus 04/21/2015   Depression 04/21/2015   GERD (gastroesophageal reflux disease) 04/21/2015   Headache 04/21/2015   Menopausal symptoms 04/21/2015   Obesity 04/21/2015   Palpitations 04/21/2015   Vitamin B12 deficiency 04/21/2015   Aortic stenosis 83/12/4598   Diastolic dysfunction 29/84/7308   Diabetes mellitus out of control 06/12/2006   Barrett esophagus 06/13/2003   Essential hypertension 06/12/1998   Genital herpes 06/12/1998   Hyperlipidemia, mixed 06/12/1998    Everlean Alstrom. Graylon Good, PT, DPT 06/09/21, 5:50 PM   [facility] [address] [place], [address] Phone: [phone]   Fax:  [phone]  Name: Katie Buckley MRN: [id number] Date of Birth: [birth year]

## 2021-06-14 DIAGNOSIS — M654 Radial styloid tenosynovitis [de Quervain]: Secondary | ICD-10-CM | POA: Diagnosis not present

## 2021-06-15 DIAGNOSIS — I10 Essential (primary) hypertension: Secondary | ICD-10-CM | POA: Diagnosis not present

## 2021-06-16 ENCOUNTER — Ambulatory Visit: Payer: PPO | Admitting: Physical Therapy

## 2021-06-20 ENCOUNTER — Ambulatory Visit: Admission: RE | Admit: 2021-06-20 | Payer: PPO | Source: Ambulatory Visit

## 2021-06-20 ENCOUNTER — Ambulatory Visit: Payer: PPO | Admitting: Physical Therapy

## 2021-06-22 ENCOUNTER — Other Ambulatory Visit (HOSPITAL_COMMUNITY): Payer: Self-pay | Admitting: Emergency Medicine

## 2021-06-22 ENCOUNTER — Telehealth: Payer: Self-pay | Admitting: Physical Therapy

## 2021-06-22 ENCOUNTER — Telehealth (HOSPITAL_COMMUNITY): Payer: Self-pay | Admitting: Emergency Medicine

## 2021-06-22 ENCOUNTER — Ambulatory Visit: Payer: PPO | Attending: Neurosurgery | Admitting: Physical Therapy

## 2021-06-22 DIAGNOSIS — I350 Nonrheumatic aortic (valve) stenosis: Secondary | ICD-10-CM

## 2021-06-22 NOTE — Telephone Encounter (Signed)
Reaching out to patient to offer assistance regarding upcoming cardiac imaging study; pt verbalizes understanding of appt date/time, parking situation and where to check in, pre-test NPO status and medications ordered, and verified current allergies; name and call back number provided for further questions should they arise Marchia Bond RN Navigator Cardiac Imaging [facility] [phone] office [phone] cell  Difficult IV  Taking metoprolol 2 hr prior to scan Arrival 130p

## 2021-06-22 NOTE — Telephone Encounter (Signed)
Called patient when she did not show up for her 5:30pm appointment. States she got tied up at another appointment. She is unable to reschedule to tomorrow because she has her cardiac CT tomorrow. Confirmed her next appointment on Monday 06/27/21 at 4:45pm.   Everlean Alstrom. Graylon Good, PT, DPT 06/22/21, 5:19 PM

## 2021-06-23 ENCOUNTER — Other Ambulatory Visit: Payer: Self-pay

## 2021-06-23 ENCOUNTER — Ambulatory Visit (HOSPITAL_COMMUNITY)
Admission: RE | Admit: 2021-06-23 | Discharge: 2021-06-23 | Disposition: A | Discharge status: Home / Self Care | Payer: PPO | Source: Ambulatory Visit | Attending: Internal Medicine | Admitting: Internal Medicine

## 2021-06-23 DIAGNOSIS — I519 Heart disease, unspecified: Secondary | ICD-10-CM | POA: Insufficient documentation

## 2021-06-23 DIAGNOSIS — I2510 Atherosclerotic heart disease of native coronary artery without angina pectoris: Secondary | ICD-10-CM | POA: Diagnosis not present

## 2021-06-23 DIAGNOSIS — K6389 Other specified diseases of intestine: Secondary | ICD-10-CM | POA: Diagnosis not present

## 2021-06-23 DIAGNOSIS — I350 Nonrheumatic aortic (valve) stenosis: Secondary | ICD-10-CM | POA: Insufficient documentation

## 2021-06-23 DIAGNOSIS — R188 Other ascites: Secondary | ICD-10-CM | POA: Diagnosis not present

## 2021-06-23 DIAGNOSIS — I700 Atherosclerosis of aorta: Secondary | ICD-10-CM | POA: Diagnosis not present

## 2021-06-23 DIAGNOSIS — I10 Essential (primary) hypertension: Secondary | ICD-10-CM | POA: Diagnosis not present

## 2021-06-23 MED ORDER — METOPROLOL TARTRATE 5 MG/5ML IV SOLN
10.0000 mg | INTRAVENOUS | Status: AC | PRN
  Administered 2021-06-23: 10 mg via INTRAVENOUS

## 2021-06-23 MED ORDER — METOPROLOL TARTRATE 5 MG/5ML IV SOLN
INTRAVENOUS | Status: AC
  Administered 2021-06-23: 10 mg via INTRAVENOUS
  Filled 2021-06-23: qty 20

## 2021-06-23 MED ORDER — IOHEXOL 350 MG/ML SOLN
100.0000 mL | Freq: Once | INTRAVENOUS | Status: AC | PRN
  Administered 2021-06-23: 100 mL via INTRAVENOUS

## 2021-06-27 ENCOUNTER — Ambulatory Visit: Payer: PPO | Admitting: Physical Therapy

## 2021-06-28 ENCOUNTER — Encounter: Payer: PPO | Admitting: Physical Therapy

## 2021-06-29 ENCOUNTER — Ambulatory Visit: Payer: PPO | Admitting: Physical Therapy

## 2021-07-01 ENCOUNTER — Ambulatory Visit: Payer: PPO | Admitting: Physical Therapy

## 2021-07-04 ENCOUNTER — Ambulatory Visit: Payer: PPO | Admitting: Physical Therapy

## 2021-07-05 DIAGNOSIS — I10 Essential (primary) hypertension: Secondary | ICD-10-CM | POA: Diagnosis not present

## 2021-07-05 DIAGNOSIS — E119 Type 2 diabetes mellitus without complications: Secondary | ICD-10-CM | POA: Diagnosis not present

## 2021-07-05 DIAGNOSIS — Z7984 Long term (current) use of oral hypoglycemic drugs: Secondary | ICD-10-CM | POA: Diagnosis not present

## 2021-07-06 ENCOUNTER — Ambulatory Visit: Payer: PPO | Admitting: Physical Therapy

## 2021-07-06 DIAGNOSIS — E1165 Type 2 diabetes mellitus with hyperglycemia: Secondary | ICD-10-CM | POA: Diagnosis not present

## 2021-07-06 DIAGNOSIS — R002 Palpitations: Secondary | ICD-10-CM | POA: Diagnosis not present

## 2021-07-06 DIAGNOSIS — E669 Obesity, unspecified: Secondary | ICD-10-CM | POA: Diagnosis not present

## 2021-07-06 DIAGNOSIS — I509 Heart failure, unspecified: Secondary | ICD-10-CM | POA: Diagnosis not present

## 2021-07-06 DIAGNOSIS — R0609 Other forms of dyspnea: Secondary | ICD-10-CM | POA: Diagnosis not present

## 2021-07-06 DIAGNOSIS — E782 Mixed hyperlipidemia: Secondary | ICD-10-CM | POA: Diagnosis not present

## 2021-07-06 DIAGNOSIS — I739 Peripheral vascular disease, unspecified: Secondary | ICD-10-CM | POA: Diagnosis not present

## 2021-07-06 DIAGNOSIS — R011 Cardiac murmur, unspecified: Secondary | ICD-10-CM | POA: Diagnosis not present

## 2021-07-06 DIAGNOSIS — I350 Nonrheumatic aortic (valve) stenosis: Secondary | ICD-10-CM | POA: Diagnosis not present

## 2021-07-06 DIAGNOSIS — Z952 Presence of prosthetic heart valve: Secondary | ICD-10-CM | POA: Diagnosis not present

## 2021-07-06 DIAGNOSIS — K219 Gastro-esophageal reflux disease without esophagitis: Secondary | ICD-10-CM | POA: Diagnosis not present

## 2021-07-06 DIAGNOSIS — I10 Essential (primary) hypertension: Secondary | ICD-10-CM | POA: Diagnosis not present

## 2021-07-11 ENCOUNTER — Ambulatory Visit: Payer: PPO | Admitting: Physical Therapy

## 2021-07-12 ENCOUNTER — Encounter: Payer: Self-pay | Admitting: Cardiology

## 2021-07-13 DIAGNOSIS — M4802 Spinal stenosis, cervical region: Secondary | ICD-10-CM | POA: Diagnosis not present

## 2021-07-13 DIAGNOSIS — F411 Generalized anxiety disorder: Secondary | ICD-10-CM | POA: Diagnosis not present

## 2021-07-13 DIAGNOSIS — E1142 Type 2 diabetes mellitus with diabetic polyneuropathy: Secondary | ICD-10-CM | POA: Diagnosis not present

## 2021-07-13 DIAGNOSIS — R41840 Attention and concentration deficit: Secondary | ICD-10-CM | POA: Diagnosis not present

## 2021-07-13 DIAGNOSIS — G629 Polyneuropathy, unspecified: Secondary | ICD-10-CM | POA: Diagnosis not present

## 2021-07-13 DIAGNOSIS — R2689 Other abnormalities of gait and mobility: Secondary | ICD-10-CM | POA: Diagnosis not present

## 2021-07-13 DIAGNOSIS — G959 Disease of spinal cord, unspecified: Secondary | ICD-10-CM | POA: Diagnosis not present

## 2021-07-13 DIAGNOSIS — G4700 Insomnia, unspecified: Secondary | ICD-10-CM | POA: Diagnosis not present

## 2021-07-13 DIAGNOSIS — F331 Major depressive disorder, recurrent, moderate: Secondary | ICD-10-CM | POA: Diagnosis not present

## 2021-07-14 ENCOUNTER — Encounter: Payer: PPO | Admitting: Physical Therapy

## 2021-07-15 ENCOUNTER — Telehealth: Payer: Self-pay

## 2021-07-15 ENCOUNTER — Other Ambulatory Visit: Payer: Self-pay

## 2021-07-15 ENCOUNTER — Ambulatory Visit
Admission: RE | Admit: 2021-07-15 | Discharge: 2021-07-15 | Disposition: A | Discharge status: Home / Self Care | Payer: Self-pay | Source: Ambulatory Visit | Attending: Physician Assistant | Admitting: Physician Assistant

## 2021-07-15 DIAGNOSIS — I350 Nonrheumatic aortic (valve) stenosis: Secondary | ICD-10-CM

## 2021-07-15 DIAGNOSIS — F411 Generalized anxiety disorder: Secondary | ICD-10-CM | POA: Diagnosis not present

## 2021-07-15 DIAGNOSIS — F331 Major depressive disorder, recurrent, moderate: Secondary | ICD-10-CM | POA: Diagnosis not present

## 2021-07-15 NOTE — Telephone Encounter (Signed)
NOTES SCANNED TO REFERRAL 

## 2021-07-17 ENCOUNTER — Other Ambulatory Visit: Payer: Self-pay | Admitting: Family Medicine

## 2021-07-17 DIAGNOSIS — E1140 Type 2 diabetes mellitus with diabetic neuropathy, unspecified: Secondary | ICD-10-CM

## 2021-07-19 ENCOUNTER — Ambulatory Visit: Payer: PPO | Attending: Neurosurgery | Admitting: Physical Therapy

## 2021-07-19 ENCOUNTER — Other Ambulatory Visit: Payer: Self-pay

## 2021-07-19 DIAGNOSIS — Z9181 History of falling: Secondary | ICD-10-CM | POA: Diagnosis not present

## 2021-07-19 DIAGNOSIS — R42 Dizziness and giddiness: Secondary | ICD-10-CM | POA: Diagnosis not present

## 2021-07-19 DIAGNOSIS — R262 Difficulty in walking, not elsewhere classified: Secondary | ICD-10-CM | POA: Diagnosis not present

## 2021-07-19 DIAGNOSIS — R2681 Unsteadiness on feet: Secondary | ICD-10-CM | POA: Diagnosis not present

## 2021-07-19 NOTE — Therapy (Signed)
[facility] [address] [place], [address] Phone: [phone]   Fax:  [phone]  Physical Therapy Evaluation  Patient Details  Name: Katie Buckley MRN: [id number] Date of Birth: [birth year] Referring Provider (PT): Jennings Books, MD (neurology)   Encounter Date: 07/19/2021   PT End of Session - 07/20/21 2021     Visit Number 1    Number of Visits 24    Date for PT Re-Evaluation 10/11/21    Authorization Type [employer] reporting period from 07/19/2021    Progress Note Due on Visit 10    PT Start Time 1650    PT Stop Time 1730    PT Time Calculation (min) 40 min    Equipment Utilized During Treatment Gait belt    Activity Tolerance Patient tolerated treatment well    Behavior During Therapy [facility] for tasks assessed/performed             Past Medical History:  Diagnosis Date   Anxiety    Aortic stenosis, mild    a.) mean gradient 18.7 mmHg on 10/2020 TTE   Depression    Dizziness    patient states went to urgent care 05/19/2021 and no medications, then went to [facility] ER but left before being seen.  Did not receive any new medications.   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   T2DM (type 2 diabetes mellitus) ([place])     Past Surgical History:  Procedure Laterality Date   ANTERIOR CERVICAL DECOMP/DISCECTOMY FUSION N/A 11/15/2020   Procedure: ANTERIOR CERVICAL DECOMPRESSION/DISCECTOMY FUSION 1 LEVEL C3/4;  Surgeon: Deetta Perla, MD;  Location: [facility] ORS;  Service: Neurosurgery;  Laterality: N/A;   AUGMENTATION MAMMAPLASTY Bilateral    breast  implants   Bone Spur  2007   foot   CARDIAC CATHETERIZATION     Carpal Tunnel Symdrome  2008   as repeated in 2009   CATARACT EXTRACTION W/PHACO Left 01/16/2019   Procedure: CATARACT EXTRACTION PHACO AND INTRAOCULAR LENS PLACEMENT ([place]) LEFT DIABETES VISION BLUE;  Surgeon: Marchia Meiers, MD;  Location: [facility] ORS;  Service: Ophthalmology;  Laterality: Left;  [place]  00:47 CDE 6.62 Fluid pack lot # [id number]   CATARACT EXTRACTION W/PHACO Right 02/06/2019   Procedure: CATARACT EXTRACTION PHACO AND INTRAOCULAR LENS PLACEMENT (IOC);  Surgeon: Marchia Meiers, MD;  Location: [facility] ORS;  Service: Ophthalmology;  Laterality: Right;  [place] 00:54.6 CDE 6.52 FLUID PACK LOT # [id number]   CHOLECYSTECTOMY  2007   COLONOSCOPY WITH PROPOFOL N/A 11/17/2015   Procedure: COLONOSCOPY WITH PROPOFOL;  Surgeon: Manya Silvas, MD;  Location: [facility] ENDOSCOPY;  Service: Endoscopy;  Laterality: N/A;   ESOPHAGOGASTRODUODENOSCOPY (EGD) WITH PROPOFOL N/A 11/17/2015   Procedure: ESOPHAGOGASTRODUODENOSCOPY (EGD) WITH PROPOFOL;  Surgeon: Manya Silvas, MD;  Location[facility] ENDOSCOPY;  Service: Endoscopy;  Laterality: N/A;   EYE SURGERY Bilateral    cataract extraction   FOOT SURGERY     INTRAVASCULAR PRESSURE WIRE/FFR STUDY N/A 05/24/2021   Procedure: INTRAVASCULAR PRESSURE WIRE/FFR STUDY;  Surgeon: Yolonda Kida, MD;  Location:  [place] [facility];  Service: Cardiovascular;  Laterality: N/A;   MANDIBLE SURGERY     NASAL SINUS SURGERY  03/2013   Multile surgery Dr. Carlis Abbott. DX eosinophilic sinusitis [phone]   Release of Trigger Finger Right 10/2010   Dr. Tamala Julian   RIGHT/LEFT HEART CATH AND CORONARY ANGIOGRAPHY N/A 05/24/2021   Procedure: RIGHT/LEFT HEART CATH AND CORONARY ANGIOGRAPHY;  Surgeon: Yolonda Kida, MD;  Location: [place] [facility];  Service: Cardiovascular;  Laterality: N/A;   TONSILLECTOMY  1960    There were no vitals filed for this visit.    Subjective Assessment - 07/20/21 2008     Subjective Patient  is known to this PT and practice. She recently underwent PT s/p cervical spine fusion for myelopathy that included assessment and treatment for balance. That doctor left the practice and she discontinued PT at that time. She is now back with a referral from her neurologist. [name]. She states she continues to have trouble keeping her eyes open at times and also unsteady and not as mobile or motivated as she would like to be. She continues to feel unsteady but has not had any recent falls. She does not use an assistive device. She states the feeling of her eyes feeling tired and hard to hold open is really bothering her. Her Dr was concerned about her really high a1c. Patient also has two appointments coming up at the movement disorder clinic at [facility] that she has been waiting several months for. Patient reports she has one artery that is 70% blocked so she will be having heart surgery to get a stent. She has a heart valve that is not working properly. They are figuring out if they need to put a stent in the valve or if they need to do a valve replacement. She sees the surgeon September 07, 2021. She has been told that it is not urgent and that she is not at imminent risk of of heart attack or stroke but it needs to be addressed. She was not given nitroglycerine to carry. Patient reports she feels unsteady when she is trying to move around or turn. She noticed it while running around in the kitchen. She will all of a sudden lose her balance and feels like she is going to fall. Sometimes she falls against whatever is closest. She has been very careful so she is not actually falling.    Pertinent History Patient is a 71 y.o. female who presents to outpatient physical therapy with a referral for medical diagnosis imbalance. This patient's chief complaints consist of unsteadiness on feet, feeling of heaviness in eyes, history of falling, double vision, feeling clouded cognitively leading to the following functional  deficits: she doesn't feel clear-headed, she has become very self-conscious that when she looks at people, she is reluctant to make eye contact (not like her), she is having facial tics that bothers her, she feels she is looking funny when people look at her. She thinks because of this fogginess in her head it makes her balance worse. Hard to do things period because she is always feeling she is unsteady. 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 Patient denies history of stroke, seizure, lung problems, bowel or bladder changes, groin numbness or tingling.  Patient denies hx of cancer, stroke, seizures, lung problem, major cardiac events, diabetes, unexplained weight loss, changes in bowel or bladder problems, new onset stumbling or dropping things apart from described below.    Limitations Lifting;Walking;Standing;House hold activities;Other (comment)   Feels very self conscious of people watching and judging how she moves, hard to complete her daily activities such as cooking and going out in the community because she constantly feels unsteady.             [facility] PT Assessment - 07/20/21 0001       Assessment   Medical Diagnosis imbalance    Referring Provider (PT) Jennings Books, MD (neurology)    Prior Therapy recently discharged from PT following ACDF for cervical myelopathy where balance and vestibular dysfunction was addressed with some improvement       Precautions   Precautions Fall      Balance Screen   Has the patient fallen in the past 6 months --   cannot remember for sure, doesn't think so   Has the patient had a decrease in activity level because of a fear of falling?  Yes    Is the patient reluctant to leave their home because of a fear of falling?  No      Home Environment   Living Environment Private residence    Living Arrangements Spouse/significant other    Type of [place] to enter    Entrance Stairs-Number of Steps 3 stairs to enter unit. Unit has hand rails.    Home Layout One level    Home Equipment Grab bars - tub/shower      Prior Function   Level of Independence Independent    Vocation Retired   worked in Occupational psychologist - loves retail therapy, walking around the neighboorhood, read, watch movies, writing a Management consultant   Overall Cognitive Status Within Functional Limits for tasks assessed      Functional Gait  Assessment   Gait Level Surface Walks 20 ft in less than 5.5 sec, no assistive devices, good speed, no evidence for imbalance, normal gait pattern, deviates no more than 6 in outside of the 12 in walkway width.   self selected: 5.97 seconds; fast: 4.59 seconds.   Change in Gait Speed Able to smoothly change walking speed without loss of balance or gait deviation. Deviate no more than 6 in outside of the 12 in walkway width.    Gait with Horizontal Head Turns Performs head turns smoothly with slight change in gait velocity (eg, minor disruption to smooth gait path), deviates 6-10 in outside 12 in walkway width, or uses an assistive device.    Gait with Vertical Head Turns Performs task with slight change in gait velocity (eg, minor disruption to smooth gait path), deviates 6 - 10 in outside 12 in walkway width or uses assistive device    Gait and Pivot Turn Pivot turns safely in greater than 3 sec and stops with no loss of balance, or pivot turns safely within 3  sec and stops with mild imbalance, requires small steps to catch balance.    Step Over Obstacle Is able to step over 2 stacked shoe boxes taped together (9 in total height) without  changing gait speed. No evidence of imbalance.    Gait with Narrow Base of Support Ambulates less than 4 steps heel to toe or cannot perform without assistance.    Gait with Eyes Closed Walks 20 ft, slow speed, abnormal gait pattern, evidence for imbalance, deviates 10-15 in outside 12 in walkway width. Requires more than 9 sec to ambulate 20 ft.    Ambulating Backwards Walks 20 ft, uses assistive device, slower speed, mild gait deviations, deviates 6-10 in outside 12 in walkway width.    Steps Alternating feet, must use rail.    Total Score 20    FGA comment: 19-24 = medium risk fall              OBJECTIVE  SELF- REPORTED FUNCTION FOTO score: 50/100 (balance questionnaire)  OBSERVATION/INSPECTION Posture Posture (seated): slightly forward head, squints at times, and has involuntary movement of eyes and face at times.  Anthropometrics Tremor: none Body composition: BMI: 27.7 Muscle bulk: appears WFL Skin: extremely pale throughout visualized areas.  Functional Mobility Transfers: sit <> stand I Gait: suddenly unsteady at times, cautious with slow turns and wide base of support.   NEUROLOGICAL Dermatomes L2-S2 appears equal and intact to light touch  Coordination  Heel drag WNL Disdiadokinesia slow Finger to nose test misses target routinely by ~ 1 inch GAZE STABILITY Horizontal, eyes open: symptomatic Vertical, eyes open: less symptomatic.   PERIPHERAL JOINT MOTION (in degrees)  Active Range of Motion (AROM) Comments: B UE and B LE grossly WFL  MUSCLE PERFORMANCE (MMT):  Comments: B LE WFL  FUNCTIONAL/BALANCE TESTS: Functional Gait Assessment (FGA): 20/30 moderate fall risk (see details above) Ten meter walking trial (10MWT): self-selected: 1 meters/second; fast: 1.3 meters/second.    Sharpened Romberg test: -Tandem stance, eyes open: R front 11 seconds, L front 11 seconds -Tandem stance, eyes closed: R front 6 seconds, L front < 1 second  Narrow stance, firm surface, eyes open: > 30 seconds Narrow stance, firm surface, eyes closed: > 30 seconds Narrow stance, compliant surface, eyes open: >30 seconds Narrow stance, compliant surface, eyes closed: 8 seconds   Objective measurements completed on examination: See above findings.        PT Education - 07/20/21 2039     Education Details Education on diagnosis, prognosis, POC, anatomy and physiology of current condition    Person(s) Educated Patient    Methods Explanation    Comprehension Verbalized understanding;Need further instruction              PT Short Term Goals - 07/20/21 2022       PT SHORT TERM GOAL #1   Title Patient will be independent with inital home exercise program to improve strength/mobility/balance and functional independence with ADLs.    Baseline HEP to be provided at 2nd visit as appropriate (07/19/2021);    Time 2    Period Weeks    Status New    Target Date 08/03/21               PT Long Term Goals - 07/20/21 2023       PT LONG TERM GOAL #1   Title Patient will be independent with long term home exercise program to improve strength/mobility/balance and functional independence with ADLs.    Baseline initial HEP to be provided at visit 2 as appropriate (07/19/2021);    Time 12    Period Weeks    Status New   TARGET DATE FOR ALL LONG TERM GOALS: 10/11/2021  PT LONG TERM GOAL #2   Title Demonstrate improved FOTO score by 10 units to demonstrate improvement in overall condition and self-reported functional ability.    Baseline 50 (07/19/2021);    Time 12    Period Weeks    Status New      PT LONG TERM GOAL #3   Title Patient will score equal or greater than 25/30 on Functional Gait Assessment to demonstrate reduction in fall risk to low fall risk classification.     Baseline 20/30 (07/19/2021);    Time 12    Period Weeks    Status New      PT LONG TERM GOAL #4   Title Patiet will demonstrate ability to stand equal or greater than 30 seconds in tandem stance with each foot front to improve her ability to ambulate without falling.n    Baseline R front 11 seconds, L front 11 seconds (07/19/2021);    Time 12    Period Weeks    Status New      PT LONG TERM GOAL #5   Title Complete community, work and/or recreational activities without limitation due to current condition.    Baseline Feels very self conscious of people watching and judging how she moves, hard to complete her daily activities such as cooking and going out in the community because she constantly feels unsteady (07/19/2021);    Time 12    Period Weeks    Status New                    Plan - 07/20/21 2036     Clinical Impression Statement Patient is a 71 y.o. female referred to outpatient physical therapy with a medical diagnosis of imbalance who presents with signs and symptoms consistent with imbalance and unsteadiness on feet with multifactorial cause. Patient was previously seen for PT at this office for several months for treatment that included interventions for vestibular and balance deficits with some improvement. Patient underwent vestibular PT assessment on 01/25/2022 and found to demonstrate signs and symptoms consistent with central vestibular component to her imbalance and did not appear to have vertigo. Patient did have episodic worsening and improvement of her symptoms over the course of that PT episode of care. Today she presents with similar deficits. Patient presents with significant balance, vestibular, gait, and coordination impairments that are limiting ability to complete usual activities such as cooking and going out in the community because she constantly feels unsteady and is very self-conscious of people watching her. Patient is a moderate fall risk according to FGA but  demonstrated sudden losses of balance when transitioning between test items suggesting she is at a higher risk of fall. Patient will benefit from skilled physical therapy intervention to address current body structure impairments and activity limitations to improve function and work towards goals set in current POC in order to return to prior level of function or maximal functional improvement.    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 clinic and waiting assessment), and per patient report unexplained weight loss (25-35lbs in past year) and a heart  murmur.    Examination-Activity Limitations Bathing;Bend;Hygiene/Grooming;Dressing;Carry;Lift;Locomotion Level;Stairs;Squat;Stand;Transfers;Other;Caring for [employer]   walking, stairs, lifting items including groceries, balance, bending over, moving her neck, performing ADL's such as getting dressed, showering, bathing.   Examination-Participation Restrictions Driving;Cleaning;Interpersonal Relationship;Yard Work;Community Activity;Laundry;Shop   Feels very self conscious of people watching and judging how she moves, hard to complete her daily activities such as cooking and going out in the community because she constantly feels unsteady.   Stability/Clinical Decision Making Evolving/Moderate complexity    Clinical Decision Making Moderate    Rehab Potential Fair    PT Frequency 2x / week     PT Duration 12 weeks    PT Treatment/Interventions Cryotherapy;ADLs/Self Care Home Management;Moist Heat;Gait training;Stair training;Therapeutic activities;Functional mobility training;Therapeutic exercise;Balance training;Neuromuscular re-education;Patient/family education;Manual techniques;Passive range of motion;Vestibular;Dry needling;Biofeedback;Canalith Repostioning;DME Instruction    PT Next Visit Plan update HEP, work on balance interventions to include vestibular training    PT Home Exercise Plan Medbridge Access Code: [email or web address]    Consulted and Agree with Plan of Care Patient             Patient will benefit from skilled therapeutic intervention in order to improve the following deficits and impairments:  Impaired perceived functional ability, Abnormal gait, Decreased activity tolerance, Dizziness, Impaired sensation, Decreased balance, Decreased mobility, Difficulty walking, Cardiopulmonary status limiting activity, Improper body mechanics, Decreased coordination, Increased muscle spasms  Visit Diagnosis: Unsteadiness on feet  Dizziness and giddiness  History of falling  Difficulty in walking, not elsewhere classified     Problem List Patient Active Problem List   Diagnosis Date Noted   Cervical myelopathy ([place]) 11/15/2020   LVH (left ventricular hypertrophy) 10/14/2020   COVID-19 05/19/2020   MRSA cellulitis 04/19/2018   Decreased libido 02/04/2018   Diabetes mellitus with neuropathy ([place]) 04/20/2017   H/O adenomatous polyp of colon 08/20/2015   Rupture of implant of right breast 05/18/2015   Anxiety disorder 04/26/2015   Allergic rhinitis 04/21/2015   Anemia 04/21/2015   Asthma 04/21/2015   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/21/2015   Chronic infection of sinus 04/21/2015   Depression 04/21/2015   GERD (gastroesophageal reflux disease) 04/21/2015   Headache 04/21/2015   Menopausal symptoms 04/21/2015   Obesity 04/21/2015   Palpitations 04/21/2015   Vitamin B12  deficiency 04/21/2015   Aortic stenosis 79/07/4095   Diastolic dysfunction 35/32/9924   Diabetes mellitus out of control 06/12/2006   Barrett esophagus 06/13/2003   Essential hypertension 06/12/1998   Genital herpes 06/12/1998   Hyperlipidemia, mixed 06/12/1998    Everlean Alstrom. Graylon Good, PT, DPT 07/20/21, 8:39 PM   [facility] [address], [place], [address] Phone: [phone]   Fax:  [phone]  Name: MARAM BENTLY MRN: [id number] Date of Birth: 06-16-1950

## 2021-07-20 ENCOUNTER — Encounter: Payer: Self-pay | Admitting: Physical Therapy

## 2021-07-20 DIAGNOSIS — J324 Chronic pansinusitis: Secondary | ICD-10-CM | POA: Diagnosis not present

## 2021-07-20 DIAGNOSIS — J340 Abscess, furuncle and carbuncle of nose: Secondary | ICD-10-CM | POA: Diagnosis not present

## 2021-07-21 ENCOUNTER — Ambulatory Visit: Payer: PPO | Admitting: Physical Therapy

## 2021-07-21 ENCOUNTER — Encounter: Payer: Self-pay | Admitting: Physical Therapy

## 2021-07-21 ENCOUNTER — Other Ambulatory Visit: Payer: Self-pay

## 2021-07-21 DIAGNOSIS — R2681 Unsteadiness on feet: Secondary | ICD-10-CM

## 2021-07-21 DIAGNOSIS — R262 Difficulty in walking, not elsewhere classified: Secondary | ICD-10-CM

## 2021-07-21 DIAGNOSIS — Z9181 History of falling: Secondary | ICD-10-CM

## 2021-07-21 DIAGNOSIS — R42 Dizziness and giddiness: Secondary | ICD-10-CM

## 2021-07-21 NOTE — Therapy (Signed)
[facility] [address] [place], [address] Phone: [phone]   Fax:  [phone]  Physical Therapy Treatment  Patient Details  Name: Katie Buckley MRN: [id number] Date of Birth: 1950/09/23 Referring Provider (PT): Jennings Books, MD (neurology)   Encounter Date: 07/21/2021   PT End of Session - 07/21/21 1839     Visit Number 2    Number of Visits 24    Date for PT Re-Evaluation 10/11/21    Authorization Type [employer] reporting period from 07/19/2021    Progress Note Due on Visit 10    PT Start Time 1730    PT Stop Time 1815    PT Time Calculation (min) 45 min    Equipment Utilized During Treatment Gait belt    Activity Tolerance Patient tolerated treatment well    Behavior During Therapy [facility] for tasks assessed/performed             Past Medical History:  Diagnosis Date   Anxiety    Aortic stenosis, mild    a.) mean gradient 18.7 mmHg on 10/2020 TTE   Depression    Dizziness    patient states went to urgent care 05/19/2021 and no medications, then went to [facility] ER but left before being seen.  Did not receive any new medications.   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   T2DM (type 2 diabetes mellitus) ([place])     Past Surgical History:  Procedure Laterality Date   ANTERIOR CERVICAL DECOMP/DISCECTOMY FUSION N/A 11/15/2020   Procedure: ANTERIOR CERVICAL DECOMPRESSION/DISCECTOMY FUSION 1 LEVEL C3/4;  Surgeon: Deetta Perla, MD;  Location: [facility] ORS;  Service: Neurosurgery;  Laterality: N/A;   AUGMENTATION MAMMAPLASTY Bilateral    breast  implants   Bone Spur  2007   foot   CARDIAC CATHETERIZATION     Carpal Tunnel Symdrome  2008   as repeated in 2009   CATARACT EXTRACTION W/PHACO Left 01/16/2019   Procedure: CATARACT EXTRACTION PHACO AND INTRAOCULAR LENS PLACEMENT ([place]) LEFT DIABETES VISION BLUE;  Surgeon: Marchia Meiers, MD;  Location: [facility] ORS;  Service: Ophthalmology;  Laterality: Left;  [place]  00:47 CDE 6.62 Fluid pack lot # [id number]   CATARACT EXTRACTION W/PHACO Right 02/06/2019   Procedure: CATARACT EXTRACTION PHACO AND INTRAOCULAR LENS PLACEMENT (I[facility]);  Surgeon: Marchia Meiers, MD;  Location: [facility] ORS;  Service: Ophthalmology;  Laterality: Right;  [place] 00:54.6 CDE 6.52 FLUID PACK LOT # [id number]   CHOLECYSTECTOMY  2007   COLONOSCOPY WITH PROPOFOL N/A 11/17/2015   Procedure: COLONOSCOPY WITH PROPOFOL;  Surgeon: Manya Silvas, MD;  Location: [facility] ENDOSCOPY;  Service: Endoscopy;  Laterality: N/A;   ESOPHAGOGASTRODUODENOSCOPY (EGD) WITH PROPOFOL N/A 11/17/2015   Procedure: ESOPHAGOGASTRODUODENOSCOPY (EGD) WITH PROPOFOL;  Surgeon: Manya Silvas, MD;  Location: [facility] ENDOSCOPY;  Service: Endoscopy;  Laterality: N/A;   EYE SURGERY Bilateral    cataract extraction   FOOT SURGERY     INTRAVASCULAR PRESSURE WIRE/FFR STUDY N/A 05/24/2021   Procedure: INTRAVASCULAR PRESSURE WIRE/FFR STUDY;  Surgeon: Yolonda Kida, MD;  Location:  [place] [facility] LAB;  Service: Cardiovascular;  Laterality: N/A;   MANDIBLE SURGERY     NASAL SINUS SURGERY  03/2013   Multile surgery Dr. Carlis Abbott. DX eosinophilic sinusitis [id number]   Release of Trigger Finger Right 10/2010   Dr. Tamala Julian   RIGHT/LEFT HEART CATH AND CORONARY ANGIOGRAPHY N/A 05/24/2021   Procedure: RIGHT/LEFT HEART CATH AND CORONARY ANGIOGRAPHY;  Surgeon: Yolonda Kida, MD;  Location: [place] [facility];  Service: Cardiovascular;  Laterality: N/A;   TONSILLECTOMY  1960    There were no vitals filed for this visit.   Subjective Assessment - 07/21/21 1729     Subjective [name]  reports she is tired today and now sleepy after getting a massage earlier. She also feels more off balance. Arrives with no AD and drove herself today. She felt okay after last PT session. She has a bad sinus infection so she feels pressure over her eyes on the left side.    Pertinent History Patient is a 71 y.o. female who presents to outpatient physical therapy with a referral for medical diagnosis imbalance. This patient's chief complaints consist of unsteadiness on feet, feeling of heaviness in eyes, history of falling, double vision, feeling clouded cognitively leading to the following functional deficits: she doesn't feel clear-headed, she has become very self-conscious that when she looks at people, she is reluctant to make eye contact (not like her), she is having facial tics that bothers her, she feels she is looking funny when people look at her. She thinks because of this fogginess in her head it makes her balance worse. Hard to do things period because she is always feeling she is unsteady. 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 Patient denies history of stroke, seizure, lung problems, bowel or bladder changes, groin numbness or tingling.  Patient denies hx of cancer, stroke, seizures, lung problem, major cardiac events, diabetes, unexplained weight loss, changes in bowel or bladder problems, new onset stumbling or dropping things apart from described  below.    Limitations Lifting;Walking;Standing;House hold activities;Other (comment)   Feels very self conscious of people watching and judging how she moves, hard to complete her daily activities such as cooking and going out in the community because she constantly feels unsteady.   Currently in Pain? Yes    Pain Score 3               TREATMENT:   Neuromuscular Re-education: to improve, balance, postural strength, muscle activation patterns, and stabilization strength required for functional activities: - tandem stance balance on hard surface, 2x1 min each foot front.  - standing foot taps on 8.5 inch treadmill progressing to not holding on or looking at feet, 3x10 each side SBA.  - narrow stance on compliant surface eyes open head turns, intermittent UE support as needed, 3x20 each direction.  - 360 degree turns at TM bar, 1x10 each direction.  - standing balance with eyes closed on compliant surface (airex pad), 1x60 seconds with self selected (wide) stance (too easy), 2x60 seconds with semi-narrow stance (appropriately challenging). SBA  -  figure 8 walking around 2 cones, attempting to keep eyes up and navigating around cones with peripheral vision, 3x10 loops (moderate difficulty), supervision.  - walking forwards while tossing small pink theraball up in front of body and catching it again, 6x30 feet, CGA  BIODEX LIMITS OF STABILITY EXERCISE Foot position F6  and F16, easy difficulty level  Trial 1: 18% in 1:30 minutes  Trial 2: 19% in 1:31 minutes  Trial 3: 6% in 3:48 minutes  Pt required multimodal cuing for proper technique and to facilitate improved neuromuscular control, strength, range of motion, and functional ability resulting in improved performance and form.     PT Education - 07/21/21 1838     Education Details form/technique rationale for exercises/interventions    Person(s) Educated Patient    Methods Explanation;Demonstration;Tactile cues;Verbal cues     Comprehension Verbalized understanding;Returned demonstration;Verbal cues required;Tactile cues required;Need further instruction              PT Short Term Goals - 07/20/21 2022       PT SHORT TERM GOAL #1   Title Patient will be independent with inital home exercise program to improve strength/mobility/balance and functional independence with ADLs.    Baseline HEP to be provided at 2nd visit as appropriate (07/19/2021);    Time 2    Period Weeks    Status New    Target Date 08/03/21               PT Long Term Goals - 07/20/21 2023       PT LONG TERM GOAL #1   Title Patient will be independent with long term home exercise program to improve strength/mobility/balance and functional independence with ADLs.    Baseline initial HEP to be provided at visit 2 as appropriate (07/19/2021);    Time 12    Period Weeks    Status New   TARGET DATE FOR ALL LONG TERM GOALS: 10/11/2021     PT LONG TERM GOAL #2   Title Demonstrate improved FOTO score by 10 units to demonstrate improvement in overall condition and self-reported functional ability.    Baseline 50 (07/19/2021);    Time 12    Period Weeks    Status New      PT LONG TERM GOAL #3   Title Patient will score equal or greater than 25/30 on Functional Gait Assessment to demonstrate reduction in fall risk to low fall risk classification.    Baseline 20/30 (07/19/2021);    Time 12    Period Weeks    Status New      PT LONG TERM GOAL #4   Title Patiet will demonstrate ability to stand equal or greater than 30 seconds in tandem stance with each foot front to improve her ability to ambulate without falling.n    Baseline R front 11 seconds, L front 11 seconds (07/19/2021);    Time 12    Period Weeks    Status New      PT LONG TERM GOAL #5   Title Complete community, work and/or recreational activities without limitation due to current condition.    Baseline Feels very self conscious of people watching and judging how she moves, hard  to complete her daily activities such as cooking and going out in the community because she constantly feels unsteady (07/19/2021);    Time 12    Period Weeks    Status New  Plan - 07/21/21 1838     Clinical Impression Statement Patient tolerated treatment well overall but did complain of fatigue with standing exercises. She had difficulty figuring out how to shift her COM during the limits of stability exercise and was much worse at it the 3rd of 3 trials, suggesting fatigue. Patient continues to report feeling of unsteadiness and demonstrates unsteadiness on her feet. She had the most trouble with positions of narrow base of support.  Guarding was provided for safety throughout session. Patient would benefit from continued management of limiting condition by skilled physical therapist to address remaining impairments and functional limitations to work towards stated goals and return to PLOF or maximal functional independence.    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    Examination-Activity Limitations Bathing;Bend;Hygiene/Grooming;Dressing;Carry;Lift;Locomotion Level;Stairs;Squat;Stand;Transfers;Other;Caring for [employer]   walking, stairs, lifting items including groceries,  balance, bending over, moving her neck, performing ADL's such as getting dressed, showering, bathing.   Examination-Participation Restrictions Driving;Cleaning;Interpersonal Relationship;Yard Work;Community Activity;Laundry;Shop   Feels very self conscious of people watching and judging how she moves, hard to complete her daily activities such as cooking and going out in the community because she constantly feels unsteady.   Stability/Clinical Decision Making Evolving/Moderate complexity    Rehab Potential Fair    PT Frequency 2x / week    PT Duration 12 weeks    PT Treatment/Interventions Cryotherapy;ADLs/Self Care Home Management;Moist Heat;Gait training;Stair training;Therapeutic activities;Functional mobility training;Therapeutic exercise;Balance training;Neuromuscular re-education;Patient/family education;Manual techniques;Passive range of motion;Vestibular;Dry needling;Biofeedback;Canalith Repostioning;DME Instruction    PT Next Visit Plan update HEP, work on balance interventions to include vestibular training    PT Home Exercise Plan Medbridge Access Code: [email or web address]    Consulted and Agree with Plan of Care Patient             Patient will benefit from skilled therapeutic intervention in order to improve the following deficits and impairments:  Impaired perceived functional ability, Abnormal gait, Decreased activity tolerance, Dizziness, Impaired sensation, Decreased balance, Decreased mobility, Difficulty walking, Cardiopulmonary status limiting activity, Improper body mechanics, Decreased coordination, Increased muscle spasms  Visit Diagnosis: Unsteadiness on feet  Dizziness and giddiness  History of falling  Difficulty in walking, not elsewhere classified     Problem List Patient Active Problem List   Diagnosis Date Noted   Cervical myelopathy ([place]) 11/15/2020   LVH (left ventricular hypertrophy) 10/14/2020   COVID-19 05/19/2020   MRSA cellulitis 04/19/2018   Decreased  libido 02/04/2018   Diabetes mellitus with neuropathy ([place]) 04/20/2017   H/O adenomatous polyp of colon 08/20/2015   Rupture of implant of right breast 05/18/2015   Anxiety disorder 04/26/2015   Allergic rhinitis 04/21/2015   Anemia 04/21/2015   Asthma 04/21/2015   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/21/2015   Chronic infection of sinus 04/21/2015   Depression 04/21/2015   GERD (gastroesophageal reflux disease) 04/21/2015   Headache 04/21/2015   Menopausal symptoms 04/21/2015   Obesity 04/21/2015   Palpitations 04/21/2015   Vitamin B12 deficiency 04/21/2015  Aortic stenosis 24/49/7530   Diastolic dysfunction 10/20/209   Diabetes mellitus out of control 06/12/2006   Barrett esophagus 06/13/2003   Essential hypertension 06/12/1998   Genital herpes 06/12/1998   Hyperlipidemia, mixed 06/12/1998    Everlean Alstrom. Graylon Good, PT, DPT 07/21/21, 6:40 PM   [facility] [address], [place], [address] Phone: [phone]   Fax:  [phone]  Name: Katie Buckley MRN: [id number] Date of Birth: July 13, 1950

## 2021-07-26 ENCOUNTER — Other Ambulatory Visit: Payer: Self-pay

## 2021-07-26 ENCOUNTER — Encounter: Payer: Self-pay | Admitting: Physical Therapy

## 2021-07-26 ENCOUNTER — Ambulatory Visit: Payer: PPO | Admitting: Physical Therapy

## 2021-07-26 DIAGNOSIS — R42 Dizziness and giddiness: Secondary | ICD-10-CM

## 2021-07-26 DIAGNOSIS — R2681 Unsteadiness on feet: Secondary | ICD-10-CM

## 2021-07-26 DIAGNOSIS — R262 Difficulty in walking, not elsewhere classified: Secondary | ICD-10-CM

## 2021-07-26 DIAGNOSIS — M654 Radial styloid tenosynovitis [de Quervain]: Secondary | ICD-10-CM | POA: Diagnosis not present

## 2021-07-26 DIAGNOSIS — Z9181 History of falling: Secondary | ICD-10-CM

## 2021-07-26 NOTE — Therapy (Signed)
[facility] [address] [place], [address] Phone: [phone]   Fax:  [phone]  Physical Therapy Treatment  Patient Details  Name: Katie Buckley MRN: [id number] Date of Birth: 05/25/1951 Referring Provider (PT): Jennings Books, MD (neurology)   Encounter Date: 07/26/2021   PT End of Session - 07/26/21 1333     Visit Number 3    Number of Visits 24    Date for PT Re-Evaluation 10/11/21    Authorization Type [employer] reporting period from 07/19/2021    Progress Note Due on Visit 10    PT Start Time 1037    PT Stop Time 1117    PT Time Calculation (min) 40 min    Equipment Utilized During Treatment Gait belt    Activity Tolerance Patient tolerated treatment well    Behavior During Therapy [facility] for tasks assessed/performed             Past Medical History:  Diagnosis Date   Anxiety    Aortic stenosis, mild    a.) mean gradient 18.7 mmHg on 10/2020 TTE   Depression    Dizziness    patient states went to urgent care 05/19/2021 and no medications, then went to [facility] ER but left before being seen.  Did not receive any new medications.   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   T2DM (type 2 diabetes mellitus) ([place] Ramirez)     Past Surgical History:  Procedure Laterality Date   ANTERIOR CERVICAL DECOMP/DISCECTOMY FUSION N/A 11/15/2020   Procedure: ANTERIOR CERVICAL DECOMPRESSION/DISCECTOMY FUSION 1 LEVEL C3/4;  Surgeon: Deetta Perla, MD;  Location: [facility] ORS;  Service: Neurosurgery;  Laterality: N/A;   AUGMENTATION MAMMAPLASTY Bilateral    breast  implants   Bone Spur  2007   foot   CARDIAC CATHETERIZATION     Carpal Tunnel Symdrome  2008   as repeated in 2009   CATARACT EXTRACTION W/PHACO Left 01/16/2019   Procedure: CATARACT EXTRACTION PHACO AND INTRAOCULAR LENS PLACEMENT (Green) LEFT DIABETES VISION BLUE;  Surgeon: Marchia Meiers, MD;  Location: [facility] ORS;  Service: Ophthalmology;  Laterality: Left;  [place]  00:47 CDE 6.62 Fluid pack lot # [id number]   CATARACT EXTRACTION W/PHACO Right 02/06/2019   Procedure: CATARACT EXTRACTION PHACO AND INTRAOCULAR LENS PLACEMENT (IOC);  Surgeon: Marchia Meiers, MD;  Location: [facility] ORS;  Service: Ophthalmology;  Laterality: Right;  [place] 00:54.6 CDE 6.52 FLUID PACK LOT # [id number]   CHOLECYSTECTOMY  2007   COLONOSCOPY WITH PROPOFOL N/A 11/17/2015   Procedure: COLONOSCOPY WITH PROPOFOL;  Surgeon: Manya Silvas, MD;  Location: [facility] ENDOSCOPY;  Service: Endoscopy;  Laterality: N/A;   ESOPHAGOGASTRODUODENOSCOPY (EGD) WITH PROPOFOL N/A 11/17/2015   Procedure: ESOPHAGOGASTRODUODENOSCOPY (EGD) WITH PROPOFOL;  Surgeon: Manya Silvas, MD;  Location: [name] ENDOSCOPY;  Service: Endoscopy;  Laterality: N/A;   EYE SURGERY Bilateral    cataract extraction   FOOT SURGERY     INTRAVASCULAR PRESSURE WIRE/FFR STUDY N/A 05/24/2021   Procedure: INTRAVASCULAR PRESSURE WIRE/FFR STUDY;  Surgeon: Yolonda Kida, MD;  Location:  [place] CV LAB;  Service: Cardiovascular;  Laterality: N/A;   MANDIBLE SURGERY     NASAL SINUS SURGERY  03/2013   Multile surgery Dr. Carlis Abbott. DX eosinophilic sinusitis [phone]   Release of Trigger Finger Right 10/2010   Dr. Tamala Julian   RIGHT/LEFT HEART CATH AND CORONARY ANGIOGRAPHY N/A 05/24/2021   Procedure: RIGHT/LEFT HEART CATH AND CORONARY ANGIOGRAPHY;  Surgeon: Yolonda Kida, MD;  Location: [place] [facility] LAB;  Service: Cardiovascular;  Laterality: N/A;   TONSILLECTOMY  1960    There were no vitals filed for this visit.   Subjective Assessment - 07/26/21 1041     Subjective Patient  reports she is having some pain in her neck and shoulders today (5/10) and in her right wrist (7/10). She sees her wrist doctor today for a follow up. She also continues to deal with a chronic sinus infeciton and the antibiotics she is taking is upsetting her stomach. She feels her sinuses are getting better slowly and is continuing to follow up with her doctor about this. She continues to feel unsteady but felt okay after last PT session and has not had any falls.    Pertinent History Patient is a 71 y.o. female who presents to outpatient physical therapy with a referral for medical diagnosis imbalance. This patient's chief complaints consist of unsteadiness on feet, feeling of heaviness in eyes, history of falling, double vision, feeling clouded cognitively leading to the following functional deficits: she doesn't feel clear-headed, she has become very self-conscious that when she looks at people, she is reluctant to make eye contact (not like her), she is having facial tics that bothers her, she feels she is looking funny when people look at her. She thinks because of this fogginess in her head it makes her balance worse. Hard to do things period because she is always feeling she is unsteady. 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 Patient denies history of stroke, seizure, lung problems, bowel or bladder changes, groin numbness or tingling.  Patient denies hx of  cancer, stroke, seizures, lung problem, major cardiac events, diabetes, unexplained weight loss, changes in bowel or bladder problems, new onset stumbling or dropping things apart from described below.    Limitations Lifting;Walking;Standing;House hold activities;Other (comment)   Feels very self conscious of people watching and judging how she moves, hard to complete her daily activities such as cooking and going out in the community because she constantly feels unsteady.   Currently in Pain? Yes    Pain Score 7               TREATMENT:    Neuromuscular Re-education: to improve, balance, postural strength, muscle activation patterns, and stabilization strength required for functional activities: - tandem stance balance on hard surface, 2x1 min each foot front.  - standing foot taps on 8.5 inch treadmill progressing to not holding on or looking at feet, 3x10 each side SBA.  - narrow stance on compliant surface eyes open head turns, intermittent UE support as needed, 3x20 each direction.  - 360 degree turns at  TM bar, 1x10 each direction.  - standing balance with eyes closed on compliant surface (airex pad),  3x60 seconds with semi-narrow stance (appropriately challenging). SBA  - figure 8 walking around 2 cones, attempting to keep eyes up and navigating around cones with peripheral vision, 3x10 loops positioned at different angles (moderate difficulty), supervision.  - walking forwards while tossing small pink theraball up in front of body and catching it again, 2x30 feet with head still, 4x30 feet with horizontal head turns, CGA - walking backwards while tossing small pink theraball up in front of body and catching it again, 2x30 feet, CGA. Had one instance of feeling she is rushing backwards without control to stop.    Pt required multimodal cuing for proper technique and to facilitate improved neuromuscular control, strength, range of motion, and functional ability resulting in improved  performance and form.    PT Education - 07/26/21 1333     Education Details form/technique rationale for exercises/interventions    Person(s) Educated Patient    Methods Explanation;Demonstration;Tactile cues;Verbal cues    Comprehension Verbalized understanding;Returned demonstration;Verbal cues required;Tactile cues required;Need further instruction              PT Short Term Goals - 07/20/21 2022       PT SHORT TERM GOAL #1   Title Patient will be independent with inital home exercise program to improve strength/mobility/balance and functional independence with ADLs.    Baseline HEP to be provided at 2nd visit as appropriate (07/19/2021);    Time 2    Period Weeks    Status New    Target Date 08/03/21               PT Long Term Goals - 07/20/21 2023       PT LONG TERM GOAL #1   Title Patient will be independent with long term home exercise program to improve strength/mobility/balance and functional independence with ADLs.    Baseline initial HEP to be provided at visit 2 as appropriate (07/19/2021);    Time 12    Period Weeks    Status New   TARGET DATE FOR ALL LONG TERM GOALS: 10/11/2021     PT LONG TERM GOAL #2   Title Demonstrate improved FOTO score by 10 units to demonstrate improvement in overall condition and self-reported functional ability.    Baseline 50 (07/19/2021);    Time 12    Period Weeks    Status New      PT LONG TERM GOAL #3   Title Patient will score equal or greater than 25/30 on Functional Gait Assessment to demonstrate reduction in fall risk to low fall risk classification.    Baseline 20/30 (07/19/2021);    Time 12    Period Weeks    Status New      PT LONG TERM GOAL #4   Title Patiet will demonstrate ability to stand equal or greater than 30 seconds in tandem stance with each foot front to improve her ability to ambulate without falling.n    Baseline R front 11 seconds, L front 11 seconds (07/19/2021);    Time 12    Period Weeks    Status  New      PT LONG TERM GOAL #5   Title Complete community, work and/or recreational activities without limitation due to current condition.    Baseline Feels very self conscious of people watching and judging how she moves, hard to complete her daily activities such as cooking and going out  in the community because she constantly feels unsteady (07/19/2021);    Time 12    Period Weeks    Status New                   Plan - 07/26/21 1336     Clinical Impression Statement Patient tolerated treatment well overall and continues to be challenged by interventions. Patient reported feeling her legs were tired by end of session and did ask for 1-2 seated rest breaks. Patient continues to report feeling unsteady and is sometimes visibly unsteady with standing activities. She did report feeling an instance of being out of control when walking backwards but was able to recover without PT physical assist. Patient would benefit from continued management of limiting condition by skilled physical therapist to address remaining impairments and functional limitations to work towards stated goals and return to PLOF or maximal functional independence.    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    Examination-Activity Limitations  Bathing;Bend;Hygiene/Grooming;Dressing;Carry;Lift;Locomotion Level;Stairs;Squat;Stand;Transfers;Other;Caring for [employer]   walking, stairs, lifting items including groceries, balance, bending over, moving her neck, performing ADL's such as getting dressed, showering, bathing.   Examination-Participation Restrictions Driving;Cleaning;Interpersonal Relationship;Yard Work;Community Activity;Laundry;Shop   Feels very self conscious of people watching and judging how she moves, hard to complete her daily activities such as cooking and going out in the community because she constantly feels unsteady.   Stability/Clinical Decision Making Evolving/Moderate complexity    Rehab Potential Fair    PT Frequency 2x / week    PT Duration 12 weeks    PT Treatment/Interventions Cryotherapy;ADLs/Self Care Home Management;Moist Heat;Gait training;Stair training;Therapeutic activities;Functional mobility training;Therapeutic exercise;Balance training;Neuromuscular re-education;Patient/family education;Manual techniques;Passive range of motion;Vestibular;Dry needling;Biofeedback;Canalith Repostioning;DME Instruction    PT Next Visit Plan update HEP, work on balance interventions to include vestibular training    PT [place] Access Code: [email or web address]    Consulted and Agree with Plan of Care Patient             Patient will benefit from skilled therapeutic intervention in order to improve the following deficits and impairments:  Impaired perceived functional ability, Abnormal gait, Decreased activity tolerance, Dizziness, Impaired sensation, Decreased balance, Decreased mobility, Difficulty walking, Cardiopulmonary status limiting activity, Improper body mechanics, Decreased coordination, Increased muscle spasms  Visit Diagnosis: Unsteadiness on feet  Dizziness and giddiness  History of falling  Difficulty in walking, not elsewhere classified     Problem List Patient Active  Problem List   Diagnosis Date Noted   Cervical myelopathy ([place]) 11/15/2020   LVH (left ventricular hypertrophy) 10/14/2020   COVID-19 05/19/2020   MRSA cellulitis 04/19/2018   Decreased libido 02/04/2018   Diabetes mellitus with neuropathy ([place]) 04/20/2017   H/O adenomatous polyp of colon 08/20/2015   Rupture of implant of right breast 05/18/2015   Anxiety disorder 04/26/2015   Allergic rhinitis 04/21/2015   Anemia 04/21/2015   Asthma 04/21/2015   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/21/2015   Chronic infection of sinus 04/21/2015  Depression 04/21/2015   GERD (gastroesophageal reflux disease) 04/21/2015   Headache 04/21/2015   Menopausal symptoms 04/21/2015   Obesity 04/21/2015   Palpitations 04/21/2015   Vitamin B12 deficiency 04/21/2015   Aortic stenosis 27/61/4709   Diastolic dysfunction 29/57/4734   Diabetes mellitus out of control 06/12/2006   Barrett esophagus 06/13/2003   Essential hypertension 06/12/1998   Genital herpes 06/12/1998   Hyperlipidemia, mixed 06/12/1998    Everlean Alstrom. Graylon Good, PT, DPT 07/26/21, 1:37 PM   [facility] [address], [place], [address] Phone: [phone]   Fax:  [phone]  Name: Katie Buckley MRN: [id number] Date of Birth: November 29, 1950

## 2021-07-28 ENCOUNTER — Other Ambulatory Visit: Payer: Self-pay

## 2021-07-28 ENCOUNTER — Encounter: Payer: Self-pay | Admitting: Physical Therapy

## 2021-07-28 ENCOUNTER — Ambulatory Visit: Payer: PPO | Admitting: Physical Therapy

## 2021-07-28 DIAGNOSIS — R2681 Unsteadiness on feet: Secondary | ICD-10-CM | POA: Diagnosis not present

## 2021-07-28 DIAGNOSIS — R262 Difficulty in walking, not elsewhere classified: Secondary | ICD-10-CM

## 2021-07-28 DIAGNOSIS — R42 Dizziness and giddiness: Secondary | ICD-10-CM

## 2021-07-28 DIAGNOSIS — Z9181 History of falling: Secondary | ICD-10-CM

## 2021-07-28 NOTE — Therapy (Signed)
[facility] [address], [place], [address] Phone: [phone]   Fax:  [phone]  Physical Therapy Treatment  Patient Details  Name: [facility] MRN: [id number] Date of Birth: [birth year] Referring Provider (PT): Jennings Books, MD (neurology)   Encounter Date: 07/28/2021   PT End of Session - 07/28/21 1213     Visit Number 4    Number of Visits 24    Date for PT Re-Evaluation 10/11/21    Authorization Type [employer] reporting period from 07/19/2021    Progress Note Due on Visit 10    PT Start Time 1115    PT Stop Time 1200    PT Time Calculation (min) 45 min    Equipment Utilized During Treatment Gait belt    Activity Tolerance Patient tolerated treatment well    Behavior During Therapy [facility] for tasks assessed/performed             Past Medical History:  Diagnosis Date   Anxiety    Aortic stenosis, mild    a.) mean gradient 18.7 mmHg on 10/2020 TTE   Depression    Dizziness    patient states went to urgent care 05/19/2021 and no medications, then went to [facility] ER but left before being seen.  Did not receive any new medications.   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   T2DM (type 2 diabetes mellitus) ([place])     Past Surgical History:  Procedure Laterality Date   ANTERIOR CERVICAL DECOMP/DISCECTOMY FUSION N/A 11/15/2020   Procedure: ANTERIOR CERVICAL DECOMPRESSION/DISCECTOMY FUSION 1 LEVEL C3/4;  Surgeon: Deetta Perla, MD;  Location: [facility] ORS;  Service: Neurosurgery;  Laterality: N/A;   AUGMENTATION MAMMAPLASTY Bilateral    breast  implants   Bone Spur  2007   foot   CARDIAC CATHETERIZATION     Carpal Tunnel Symdrome  2008   as repeated in 2009   CATARACT EXTRACTION W/PHACO Left 01/16/2019   Procedure: CATARACT EXTRACTION PHACO AND INTRAOCULAR LENS PLACEMENT ([place]) LEFT DIABETES VISION BLUE;  Surgeon: Marchia Meiers, MD;  Location: [facility] ORS;  Service: Ophthalmology;  Laterality: Left;  [place]  00:47 CDE 6.62 Fluid pack lot # [id number]   CATARACT EXTRACTION W/PHACO Right 02/06/2019   Procedure: CATARACT EXTRACTION PHACO AND INTRAOCULAR LENS PLACEMENT (IOC);  Surgeon: Marchia Meiers, MD;  Location: [facility] ORS;  Service: Ophthalmology;  Laterality: Right;  [place] 00:54.6 CDE 6.52 FLUID PACK LOT # [id number]   CHOLECYSTECTOMY  2007   COLONOSCOPY WITH PROPOFOL N/A 11/17/2015   Procedure: COLONOSCOPY WITH PROPOFOL;  Surgeon: Manya Silvas, MD;  Location: [facility] ENDOSCOPY;  Service: Endoscopy;  Laterality: N/A;   ESOPHAGOGASTRODUODENOSCOPY (EGD) WITH PROPOFOL N/A 11/17/2015   Procedure: ESOPHAGOGASTRODUODENOSCOPY (EGD) WITH PROPOFOL;  Surgeon: Manya Silvas, MD;  Location: [facility] ENDOSCOPY;  Service: Endoscopy;  Laterality: N/A;   EYE SURGERY Bilateral    cataract extraction   FOOT SURGERY     INTRAVASCULAR PRESSURE WIRE/FFR STUDY N/A 05/24/2021   Procedure: INTRAVASCULAR PRESSURE WIRE/FFR STUDY;  Surgeon: Yolonda Kida, MD;  Location:  [facility];  Service: Cardiovascular;  Laterality: N/A;   MANDIBLE SURGERY     NASAL SINUS SURGERY  03/2013   Multile surgery Dr. Carlis Abbott. DX eosinophilic sinusitis [id number]   Release of Trigger Finger Right 10/2010   Dr. Tamala Julian   RIGHT/LEFT HEART CATH AND CORONARY ANGIOGRAPHY N/A 05/24/2021   Procedure: RIGHT/LEFT HEART CATH AND CORONARY ANGIOGRAPHY;  Surgeon: Yolonda Kida, MD;  Location: [facility];  Service: Cardiovascular;  Laterality: N/A;   TONSILLECTOMY  1960    There were no vitals filed for this visit.   Subjective Assessment - 07/28/21 1115     Subjective Patient  reports she feels good today. She feels a little slow this morning but her balance feels pretty good. She states she has no pain currently and felt okay after last PT session. Denies falls since last PT session.    Pertinent History Patient is a 71 y.o. female who presents to outpatient physical therapy with a referral for medical diagnosis imbalance. This patient's chief complaints consist of unsteadiness on feet, feeling of heaviness in eyes, history of falling, double vision, feeling clouded cognitively leading to the following functional deficits: she doesn't feel clear-headed, she has become very self-conscious that when she looks at people, she is reluctant to make eye contact (not like her), she is having facial tics that bothers her, she feels she is looking funny when people look at her. She thinks because of this fogginess in her head it makes her balance worse. Hard to do things period because she is always feeling she is unsteady. 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 Patient denies history of stroke, seizure, lung problems, bowel or bladder changes, groin numbness or tingling.  Patient denies hx of cancer, stroke, seizures, lung problem, major cardiac events, diabetes, unexplained weight loss, changes in bowel or bladder problems, new onset stumbling or dropping things apart from described below.    Limitations Lifting;Walking;Standing;House hold  activities;Other (comment)   Feels very self conscious of people watching and judging how she moves, hard to complete her daily activities such as cooking and going out in the community because she constantly feels unsteady.   Currently in Pain? No/denies               TREATMENT:    Neuromuscular Re-education: to improve, balance, postural strength, muscle activation patterns, and stabilization strength required for functional activities: - tandem stance balance on hard surface, 2x1 min each foot front.  - standing foot taps on 8.5 inch treadmill progressing to not holding on or looking at feet, 1x10 each side firm surface, 1x10 each side on floor mat (too easy), 3x10 each side on airex pad (appropriately challenging), SBA.  - tandem/semi-tandem walking with CGA, 6x21 feet - 360 degree turns at TM bar, 1x10 each direction.  - narrow stance on compliant surface eyes open head turns, intermittent UE support as needed, 3x20 each direction. (Improving speed).  - walking forwards while tossing small pink theraball up in front of body and catching it again, 2x30 feet with head  still, 4x30 feet with horizontal head turns, CGA - walking backwards while tossing small pink theraball up in front of body and catching it again, 6x30 feet, CGA. Had one instance of feeling she is rushing backwards without control to stop.  - standing balance with eyes closed on compliant surface (airex pad),  3x60 seconds with semi-narrow stance (appropriately challenging). SBA    Pt required multimodal cuing for proper technique and to facilitate improved neuromuscular control, strength, range of motion, and functional ability resulting in improved performance and form.       PT Education - 07/28/21 1130     Education Details form/technique rationale for exercises/interventions    Person(s) Educated Patient    Methods Demonstration;Explanation;Tactile cues;Verbal cues    Comprehension Verbalized  understanding;Returned demonstration;Verbal cues required;Tactile cues required;Need further instruction              PT Short Term Goals - 07/20/21 2022       PT SHORT TERM GOAL #1   Title Patient will be independent with inital home exercise program to improve strength/mobility/balance and functional independence with ADLs.    Baseline HEP to be provided at 2nd visit as appropriate (07/19/2021);    Time 2    Period Weeks    Status New    Target Date 08/03/21               PT Long Term Goals - 07/20/21 2023       PT LONG TERM GOAL #1   Title Patient will be independent with long term home exercise program to improve strength/mobility/balance and functional independence with ADLs.    Baseline initial HEP to be provided at visit 2 as appropriate (07/19/2021);    Time 12    Period Weeks    Status New   TARGET DATE FOR ALL LONG TERM GOALS: 10/11/2021     PT LONG TERM GOAL #2   Title Demonstrate improved FOTO score by 10 units to demonstrate improvement in overall condition and self-reported functional ability.    Baseline 50 (07/19/2021);    Time 12    Period Weeks    Status New      PT LONG TERM GOAL #3   Title Patient will score equal or greater than 25/30 on Functional Gait Assessment to demonstrate reduction in fall risk to low fall risk classification.    Baseline 20/30 (07/19/2021);    Time 12    Period Weeks    Status New      PT LONG TERM GOAL #4   Title Patiet will demonstrate ability to stand equal or greater than 30 seconds in tandem stance with each foot front to improve her ability to ambulate without falling.n    Baseline R front 11 seconds, L front 11 seconds (07/19/2021);    Time 12    Period Weeks    Status New      PT LONG TERM GOAL #5   Title Complete community, work and/or recreational activities without limitation due to current condition.    Baseline Feels very self conscious of people watching and judging how she moves, hard to complete her daily  activities such as cooking and going out in the community because she constantly feels unsteady (07/19/2021);    Time 12    Period Weeks    Status New                   Plan - 07/28/21 1215     Clinical Impression Statement  Patient tolerated treatment well overall with usual request to sit in waiting room for  a bit before going out to her car. She demonstrates improving ability to participate in more difficult exercises. Today she had the most trouble with walking backwards while tossing a ball up in front of her. She felt out of control "running" backwards at times but was able to self-correct all losses of balance with step strategy. Patient continues to have balance deficits that impair her safety and quality of life. Plan to continue with progressive balance exercises as tolerated next session. Patient would benefit from continued management of limiting condition by skilled physical therapist to address remaining impairments and functional limitations to work towards stated goals and return to PLOF or maximal functional independence.    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    Examination-Activity Limitations Bathing;Bend;Hygiene/Grooming;Dressing;Carry;Lift;Locomotion  Level;Stairs;Squat;Stand;Transfers;Other;Caring for [employer]   walking, stairs, lifting items including groceries, balance, bending over, moving her neck, performing ADL's such as getting dressed, showering, bathing.   Examination-Participation Restrictions Driving;Cleaning;Interpersonal Relationship;Yard Work;Community Activity;Laundry;Shop   Feels very self conscious of people watching and judging how she moves, hard to complete her daily activities such as cooking and going out in the community because she constantly feels unsteady.   Stability/Clinical Decision Making Evolving/Moderate complexity    Rehab Potential Fair    PT Frequency 2x / week    PT Duration 12 weeks    PT Treatment/Interventions Cryotherapy;ADLs/Self Care Home Management;Moist Heat;Gait training;Stair training;Therapeutic activities;Functional mobility training;Therapeutic exercise;Balance training;Neuromuscular re-education;Patient/family education;Manual techniques;Passive range of motion;Vestibular;Dry needling;Biofeedback;Canalith Repostioning;DME Instruction    PT Next Visit Plan update HEP, work on balance interventions to include vestibular training    PT Home Exercise Plan Medbridge Access Code: [email or web address]    Consulted and Agree with Plan of Care Patient             Patient will benefit from skilled therapeutic intervention in order to improve the following deficits and impairments:  Impaired perceived functional ability, Abnormal gait, Decreased activity tolerance, Dizziness, Impaired sensation, Decreased balance, Decreased mobility, Difficulty walking, Cardiopulmonary status limiting activity, Improper body mechanics, Decreased coordination, Increased muscle spasms  Visit Diagnosis: Unsteadiness on feet  Dizziness and giddiness  History of falling  Difficulty in walking, not elsewhere classified     Problem List Patient Active Problem List   Diagnosis Date Noted   Cervical myelopathy  ([place]) 11/15/2020   LVH (left ventricular hypertrophy) 10/14/2020   COVID-19 05/19/2020   MRSA cellulitis 04/19/2018   Decreased libido 02/04/2018   Diabetes mellitus with neuropathy ([place]) 04/20/2017   H/O adenomatous polyp of colon 08/20/2015   Rupture of implant of right breast 05/18/2015   Anxiety disorder 04/26/2015   Allergic rhinitis 04/21/2015   Anemia 04/21/2015   Asthma 04/21/2015   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/21/2015   Chronic infection of sinus 04/21/2015   Depression 04/21/2015   GERD (gastroesophageal reflux disease) 04/21/2015   Headache 04/21/2015   Menopausal symptoms 04/21/2015   Obesity 04/21/2015  Palpitations 04/21/2015   Vitamin B12 deficiency 04/21/2015   Aortic stenosis 70/48/8891   Diastolic dysfunction 69/45/0388   Diabetes mellitus out of control 06/12/2006   Barrett esophagus 06/13/2003   Essential hypertension 06/12/1998   Genital herpes 06/12/1998   Hyperlipidemia, mixed 06/12/1998    Everlean Alstrom. Graylon Good, PT, DPT 07/28/21, 12:16 PM   [facility] [address] [place], [address] Phone: [phone]   Fax:  [phone]  Name: [place] [facility] MRN: [id number] Date of Birth: [birth year]

## 2021-08-02 ENCOUNTER — Ambulatory Visit: Payer: PPO | Admitting: Physical Therapy

## 2021-08-04 ENCOUNTER — Other Ambulatory Visit: Payer: Self-pay

## 2021-08-04 ENCOUNTER — Ambulatory Visit: Payer: PPO | Admitting: Physical Therapy

## 2021-08-04 ENCOUNTER — Encounter: Payer: Self-pay | Admitting: Physical Therapy

## 2021-08-04 VITALS — BP 164/52 | HR 62

## 2021-08-04 DIAGNOSIS — F331 Major depressive disorder, recurrent, moderate: Secondary | ICD-10-CM | POA: Diagnosis not present

## 2021-08-04 DIAGNOSIS — Z9181 History of falling: Secondary | ICD-10-CM

## 2021-08-04 DIAGNOSIS — R262 Difficulty in walking, not elsewhere classified: Secondary | ICD-10-CM

## 2021-08-04 DIAGNOSIS — R2681 Unsteadiness on feet: Secondary | ICD-10-CM

## 2021-08-04 DIAGNOSIS — R42 Dizziness and giddiness: Secondary | ICD-10-CM

## 2021-08-04 DIAGNOSIS — F411 Generalized anxiety disorder: Secondary | ICD-10-CM | POA: Diagnosis not present

## 2021-08-04 NOTE — Therapy (Signed)
[facility] [address] [place], [address] Phone: [phone]   Fax:  [phone]  Physical Therapy Treatment  Patient Details  Name: Katie Buckley MRN: [id number] Date of Birth: 01-15-1951 Referring Provider (PT): Jennings Books, MD (neurology)   Encounter Date: 08/04/2021   PT End of Session - 08/04/21 1919     Visit Number 5    Number of Visits 24    Date for PT Re-Evaluation 10/11/21    Authorization Type [employer] reporting period from 07/19/2021    Progress Note Due on Visit 10    PT Start Time 1820    PT Stop Time 1900    PT Time Calculation (min) 40 min    Equipment Utilized During Treatment Gait belt    Activity Tolerance Patient tolerated treatment well    Behavior During Therapy [facility] for tasks assessed/performed             Past Medical History:  Diagnosis Date   Anxiety    Aortic stenosis, mild    a.) mean gradient 18.7 mmHg on 10/2020 TTE   Depression    Dizziness    patient states went to urgent care 05/19/2021 and no medications, then went to [facility] ER but left before being seen.  Did not receive any new medications.   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   T2DM (type 2 diabetes mellitus) ([place])     Past Surgical History:  Procedure Laterality Date   ANTERIOR CERVICAL DECOMP/DISCECTOMY FUSION N/A 11/15/2020   Procedure: ANTERIOR CERVICAL DECOMPRESSION/DISCECTOMY FUSION 1 LEVEL C3/4;  Surgeon: Deetta Perla, MD;  Location: [facility] ORS;  Service: Neurosurgery;  Laterality: N/A;   AUGMENTATION MAMMAPLASTY Bilateral    breast  implants   Bone Spur  2007   foot   CARDIAC CATHETERIZATION     Carpal Tunnel Symdrome  2008   as repeated in 2009   CATARACT EXTRACTION W/PHACO Left 01/16/2019   Procedure: CATARACT EXTRACTION PHACO AND INTRAOCULAR LENS PLACEMENT ([place]) LEFT DIABETES VISION BLUE;  Surgeon: Marchia Meiers, MD;  Location: [facility] ORS;  Service: Ophthalmology;  Laterality: Left;  [place]  00:47 CDE 6.62 Fluid pack lot # [id number]   CATARACT EXTRACTION W/PHACO Right 02/06/2019   Procedure: CATARACT EXTRACTION PHACO AND INTRAOCULAR LENS PLACEMENT ([facility]);  Surgeon: Marchia Meiers, MD;  Location: [facility] ORS;  Service: Ophthalmology;  Laterality: Right;  [place] 00:54.6 CDE 6.52 FLUID PACK LOT # [id number]   CHOLECYSTECTOMY  2007   COLONOSCOPY WITH PROPOFOL N/A 11/17/2015   Procedure: COLONOSCOPY WITH PROPOFOL;  Surgeon: Manya Silvas, MD;  Location[facility] END[facility]COPY;  Service: Endoscopy;  Laterality: N/A;   ESOPHAGOGASTRODUODENOSCOPY (EGD) WITH PROPOFOL N/A 11/17/2015   Procedure: ESOPHAGOGASTRODUODENOSCOPY (EGD) WITH PROPOFOL;  Surgeon: Manya Silvas, MD;  Location: [facility] ENDOSCOPY;  Service: Endoscopy;  Laterality: N/A;   EYE SURGERY Bilateral    cataract extraction   FOOT SURGERY     INTRAVASCULAR PRESSURE WIRE/FFR STUDY N/A 05/24/2021   Procedure: INTRAVASCULAR PRESSURE WIRE/FFR STUDY;  Surgeon: Yolonda Kida, MD;  Location:  [place] [facility];  Service: Cardiovascular;  Laterality: N/A;   MANDIBLE SURGERY     NASAL SINUS SURGERY  03/2013   Multile surgery Dr. Carlis Abbott. DX eosinophilic sinusitis [id number]   Release of Trigger Finger Right 10/2010   Dr. Tamala Julian   RIGHT/LEFT HEART CATH AND CORONARY ANGIOGRAPHY N/A 05/24/2021   Procedure: RIGHT/LEFT HEART CATH AND CORONARY ANGIOGRAPHY;  Surgeon: Yolonda Kida, MD;  Location: [place] [facility];  Service: Cardiovascular;  Laterality: N/A;   TONSILLECTOMY  1960    Vitals:   08/04/21 1822 08/04/21 1844  BP: (!) 177/48 (!) 164/52  Pulse: 65 62  SpO2: 99% 98%      Subjective Assessment - 08/04/21 1822     Subjective Patient reports things have been stressfull lately and she has felt her chest feels tight and she is short of breath the last couple of days but especially today. She states her balance continues to feel off and her movements don't feel as fluid. She has had headaches lately. She rates her headache 6/10 currently.    Pertinent History Patient is a 71 y.o. female who presents to outpatient physical therapy with a referral for medical diagnosis imbalance. This patient's chief complaints consist of unsteadiness on feet, feeling of heaviness in eyes, history of falling, double vision, feeling clouded cognitively leading to the following functional deficits: she doesn't feel clear-headed, she has become very self-conscious that when she looks at people, she is reluctant to make eye contact (not like her), she is having facial tics that bothers her, she feels she is looking funny when people look at her. She thinks because of this fogginess in her head it makes her balance worse. Hard to do things period because she is always feeling she is unsteady. 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 Patient denies history of stroke, seizure, lung problems, bowel or bladder changes, groin numbness or tingling.  Patient denies hx of cancer, stroke, seizures, lung problem, major cardiac events, diabetes, unexplained weight loss,  changes in bowel or bladder problems, new onset stumbling or dropping things apart from described below.    Limitations Lifting;Walking;Standing;House hold activities;Other (comment)   Feels very self conscious of people watching and judging how she moves, hard to complete her daily activities such as cooking and going out in the community because she constantly feels unsteady.   Currently in Pain? Yes    Pain Score 6     Pain Location Head               TREATMENT:    Neuromuscular Re-education: to improve, balance, postural strength, muscle activation patterns, and stabilization strength required for functional activities: - vitals check (see above) to assess safety of interventions - tandem stance balance on firm surface, 2x1 min each foot front.  - standing foot taps on 8.5 inch treadmill progressing to not holding on or looking at feet,  3x10 each side on airex pad (appropriately challenging), SBA.  - narrow stance on compliant surface eyes open head turns, intermittent UE support as needed, 3x20 each direction. (  Improving speed). - standing balance with eyes closed on compliant surface (airex pad),  3x60 seconds with semi-narrow stance (appropriately challenging). SBA  - tandem/semi-tandem walking with CGA, 6x21 feet - 360 degree turns at TM bar, 1x10 each direction.  - walking forwards while tossing small pink theraball up in front of body and catching it again, 2x30 feet with head still,  CGA   Pt required multimodal cuing for proper technique and to facilitate improved neuromuscular control, strength, range of motion, and functional ability resulting in improved performance and form.      PT Education - 08/04/21 1918     Education Details form/technique rationale for exercises/interventions    Person(s) Educated Patient    Methods Explanation;Demonstration;Tactile cues;Verbal cues    Comprehension Verbalized understanding;Returned demonstration;Verbal cues required;Tactile  cues required;Need further instruction              PT Short Term Goals - 07/20/21 2022       PT SHORT TERM GOAL #1   Title Patient will be independent with inital home exercise program to improve strength/mobility/balance and functional independence with ADLs.    Baseline HEP to be provided at 2nd visit as appropriate (07/19/2021);    Time 2    Period Weeks    Status New    Target Date 08/03/21               PT Long Term Goals - 07/20/21 2023       PT LONG TERM GOAL #1   Title Patient will be independent with long term home exercise program to improve strength/mobility/balance and functional independence with ADLs.    Baseline initial HEP to be provided at visit 2 as appropriate (07/19/2021);    Time 12    Period Weeks    Status New   TARGET DATE FOR ALL LONG TERM GOALS: 10/11/2021     PT LONG TERM GOAL #2   Title Demonstrate improved FOTO score by 10 units to demonstrate improvement in overall condition and self-reported functional ability.    Baseline 50 (07/19/2021);    Time 12    Period Weeks    Status New      PT LONG TERM GOAL #3   Title Patient will score equal or greater than 25/30 on Functional Gait Assessment to demonstrate reduction in fall risk to low fall risk classification.    Baseline 20/30 (07/19/2021);    Time 12    Period Weeks    Status New      PT LONG TERM GOAL #4   Title Patiet will demonstrate ability to stand equal or greater than 30 seconds in tandem stance with each foot front to improve her ability to ambulate without falling.n    Baseline R front 11 seconds, L front 11 seconds (07/19/2021);    Time 12    Period Weeks    Status New      PT LONG TERM GOAL #5   Title Complete community, work and/or recreational activities without limitation due to current condition.    Baseline Feels very self conscious of people watching and judging how she moves, hard to complete her daily activities such as cooking and going out in the community because she  constantly feels unsteady (07/19/2021);    Time 12    Period Weeks    Status New                   Plan - 08/04/21 1921     Clinical Impression  Statement Patient tolerated treatment well overall and reported decreased headache by end of session. Patient continues to express frustration over how challenging exercises are and requires encouragement and guarding for successful and safe completion of interventions. Patient with headache and elevated blood pressure this session but symptoms did not get worse during session. Plan to continue interventions for balance at future sessions.Patient would benefit from continued management of limiting condition by skilled physical therapist to address remaining impairments and functional limitations to work towards stated goals and return to PLOF or maximal functional independence.    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    Examination-Activity Limitations Bathing;Bend;Hygiene/Grooming;Dressing;Carry;Lift;Locomotion Level;Stairs;Squat;Stand;Transfers;Other;Caring for [employer]   walking, stairs, lifting items including groceries, balance, bending over, moving her neck, performing ADL's such as getting dressed, showering, bathing.   Examination-Participation  Restrictions Driving;Cleaning;Interpersonal Relationship;Yard Work;Community Activity;Laundry;Shop   Feels very self conscious of people watching and judging how she moves, hard to complete her daily activities such as cooking and going out in the community because she constantly feels unsteady.   Stability/Clinical Decision Making Evolving/Moderate complexity    Rehab Potential Fair    PT Frequency 2x / week    PT Duration 12 weeks    PT Treatment/Interventions Cryotherapy;ADLs/Self Care Home Management;Moist Heat;Gait training;Stair training;Therapeutic activities;Functional mobility training;Therapeutic exercise;Balance training;Neuromuscular re-education;Patient/family education;Manual techniques;Passive range of motion;Vestibular;Dry needling;Biofeedback;Canalith Repostioning;DME Instruction    PT Next Visit Plan update HEP, work on balance interventions to include vestibular training    PT Home Exercise Plan Medbridge Access Code: [email or web address]    Consulted and Agree with Plan of Care Patient             Patient will benefit from skilled therapeutic intervention in order to improve the following deficits and impairments:  Impaired perceived functional ability, Abnormal gait, Decreased activity tolerance, Dizziness, Impaired sensation, Decreased balance, Decreased mobility, Difficulty walking, Cardiopulmonary status limiting activity, Improper body mechanics, Decreased coordination, Increased muscle spasms  Visit Diagnosis: Unsteadiness on feet  Dizziness and giddiness  History of falling  Difficulty in walking, not elsewhere classified     Problem List Patient Active Problem List   Diagnosis Date Noted   Cervical myelopathy ([place]) 11/15/2020   LVH (left ventricular hypertrophy) 10/14/2020   COVID-19 05/19/2020   MRSA cellulitis 04/19/2018   Decreased libido 02/04/2018   Diabetes mellitus with neuropathy ([place]) 04/20/2017   H/O adenomatous polyp of colon 08/20/2015   Rupture of  implant of right breast 05/18/2015   Anxiety disorder 04/26/2015   Allergic rhinitis 04/21/2015   Anemia 04/21/2015   Asthma 04/21/2015   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/21/2015   Chronic infection of sinus 04/21/2015   Depression 04/21/2015   GERD (gastroesophageal reflux disease) 04/21/2015   Headache 04/21/2015   Menopausal symptoms 04/21/2015   Obesity 04/21/2015   Palpitations 04/21/2015   Vitamin B12 deficiency 04/21/2015   Aortic stenosis 47/82/9562   Diastolic dysfunction 13/01/6577   Diabetes mellitus out of control 06/12/2006   Barrett esophagus 06/13/2003   Essential  hypertension 06/12/1998   Genital herpes 06/12/1998   Hyperlipidemia, mixed 06/12/1998    Everlean Alstrom. Graylon Good, PT, DPT 08/04/21, 7:22 PM   [facility] [address], [place], [address] Phone: [phone]   Fax:  [phone]  Name: PERNIE GROSSO MRN: [id number] Date of Birth: [birth year]

## 2021-08-08 DIAGNOSIS — G20 Parkinson's disease: Secondary | ICD-10-CM | POA: Diagnosis not present

## 2021-08-08 DIAGNOSIS — R413 Other amnesia: Secondary | ICD-10-CM | POA: Diagnosis not present

## 2021-08-09 ENCOUNTER — Other Ambulatory Visit: Payer: Self-pay

## 2021-08-09 ENCOUNTER — Ambulatory Visit: Payer: PPO | Admitting: Physical Therapy

## 2021-08-09 DIAGNOSIS — R42 Dizziness and giddiness: Secondary | ICD-10-CM

## 2021-08-09 DIAGNOSIS — R2681 Unsteadiness on feet: Secondary | ICD-10-CM

## 2021-08-09 DIAGNOSIS — R262 Difficulty in walking, not elsewhere classified: Secondary | ICD-10-CM

## 2021-08-09 DIAGNOSIS — Z9181 History of falling: Secondary | ICD-10-CM

## 2021-08-09 NOTE — Therapy (Signed)
[facility] [address], [place], [address] Phone: [phone]   Fax:  [phone]  Physical Therapy Treatment  Patient Details  Name: Katie Buckley MRN: [id number] Date of Birth: [birth year] Referring Provider (PT): Jennings Books, MD (neurology)   Encounter Date: 08/09/2021   PT End of Session - 08/09/21 1714     Visit Number 6    Number of Visits 24    Date for PT Re-Evaluation 10/11/21    Authorization Type [employer] reporting period from 07/19/2021    Progress Note Due on Visit 10    PT Start Time 1605    PT Stop Time 1645    PT Time Calculation (min) 40 min    Equipment Utilized During Treatment Gait belt    Activity Tolerance Patient tolerated treatment well    Behavior During Therapy [facility] for tasks assessed/performed             Past Medical History:  Diagnosis Date   Anxiety    Aortic stenosis, mild    a.) mean gradient 18.7 mmHg on 10/2020 TTE   Depression    Dizziness    patient states went to urgent care 05/19/2021 and no medications, then went to [facility] ER but left before being seen.  Did not receive any new medications.   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   T2DM (type 2 diabetes mellitus) ([place])     Past Surgical History:  Procedure Laterality Date   ANTERIOR CERVICAL DECOMP/DISCECTOMY FUSION N/A 11/15/2020   Procedure: ANTERIOR CERVICAL DECOMPRESSION/DISCECTOMY FUSION 1 LEVEL C3/4;  Surgeon: Deetta Perla, MD;  Location: [facility] ORS;  Service: Neurosurgery;  Laterality: N/A;   AUGMENTATION MAMMAPLASTY Bilateral    breast  implants   Bone Spur  2007   foot   CARDIAC CATHETERIZATION     Carpal Tunnel Symdrome  2008   as repeated in 2009   CATARACT EXTRACTION W/PHACO Left 01/16/2019   Procedure: CATARACT EXTRACTION PHACO AND INTRAOCULAR LENS PLACEMENT ([place]) LEFT DIABETES VISION BLUE;  Surgeon: Marchia Meiers, MD;  Location: [facility] ORS;  Service: Ophthalmology;  Laterality: Left;  [place]  00:47 CDE 6.62 Fluid pack lot # [id number]   CATARACT EXTRACTION W/PHACO Right 02/06/2019   Procedure: CATARACT EXTRACTION PHACO AND INTRAOCULAR LENS PLACEMENT (I[facility]);  Surgeon: Marchia Meiers, MD;  Location: [facility] ORS;  Service: Ophthalmology;  Laterality: Right;  [place] 00:54.6 CDE 6.52 FLUID PACK LOT # [id number]   CHOLECYSTECTOMY  2007   COLONOSCOPY WITH PROPOFOL N/A 11/17/2015   Procedure: COLONOSCOPY WITH PROPOFOL;  Surgeon: Manya Silvas, MD;  Location: [facility];  Service: Endoscopy;  Laterality: N/A;   ESOPHAGOGASTRODUODENOSCOPY (EGD) WITH PROPOFOL N/A 11/17/2015   Procedure: ESOPHAGOGASTRODUODENOSCOPY (EGD) WITH PROPOFOL;  Surgeon: Manya Silvas, MD;  Location: [facility] ENDOSCOPY;  Service: Endoscopy;  Laterality: N/A;   EYE SURGERY Bilateral    cataract extraction   FOOT SURGERY     INTRAVASCULAR PRESSURE WIRE/FFR STUDY N/A 05/24/2021   Procedure: INTRAVASCULAR PRESSURE WIRE/FFR STUDY;  Surgeon: Yolonda Kida, MD;  Location:  [place] [facility];  Service: Cardiovascular;  Laterality: N/A;   MANDIBLE SURGERY     NASAL SINUS SURGERY  03/2013   Multile surgery Dr. Carlis Abbott. DX eosinophilic sinusitis [id number]   Release of Trigger Finger Right 10/2010   Dr. Tamala Julian   RIGHT/LEFT HEART CATH AND CORONARY ANGIOGRAPHY N/A 05/24/2021   Procedure: RIGHT/LEFT HEART CATH AND CORONARY ANGIOGRAPHY;  Surgeon: Yolonda Kida, MD;  Location: [place] [facility];  Service: Cardiovascular;  Laterality: N/A;   TONSILLECTOMY  1960    There were no vitals filed for this visit.   Subjective Assessment - 08/09/21 1607     Subjective Patient  reports she is feeling pretty well today. She saw the movement specialist clinic and they did not have any new info for her. They advised her to continue her PT and felt the main reason for her imbalance was neuropathy. She has 3/10 pain in the back of her head.    Pertinent History Patient is a 71 y.o. female who presents to outpatient physical therapy with a referral for medical diagnosis imbalance. This patient's chief complaints consist of unsteadiness on feet, feeling of heaviness in eyes, history of falling, double vision, feeling clouded cognitively leading to the following functional deficits: she doesn't feel clear-headed, she has become very self-conscious that when she looks at people, she is reluctant to make eye contact (not like her), she is having facial tics that bothers her, she feels she is looking funny when people look at her. She thinks because of this fogginess in her head it makes her balance worse. Hard to do things period because she is always feeling she is unsteady. 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 Patient denies history of stroke, seizure, lung problems, bowel or bladder changes, groin numbness or tingling.  Patient denies hx of cancer, stroke, seizures, lung problem, major cardiac events, diabetes, unexplained weight loss, changes in bowel or bladder problems, new onset stumbling or dropping things apart from described below.     Limitations Lifting;Walking;Standing;House hold activities;Other (comment)   Feels very self conscious of people watching and judging how she moves, hard to complete her daily activities such as cooking and going out in the community because she constantly feels unsteady.   Currently in Pain? Yes    Pain Score 3               TREATMENT:    Neuromuscular Re-education: to improve, balance, postural strength, muscle activation patterns, and stabilization strength required for functional activities: - tandem stance balance on firm surface, 2x1 min each foot front.  - standing foot taps on 8.5 inch treadmill progressing to not holding on or looking at feet,  3x20 each side (last two sets on airex pad - appropriately challenging), SBA.  - narrow stance on compliant surface eyes open head turns, intermittent UE support as needed, 1x20 each direction. - half tandem stance on compliant surface eyes open head turns, intermittent UE support as needed, 2x20 each direction. - standing balance with eyes closed on compliant surface (airex pad),  3x60  seconds with semi-narrow stance (appropriately challenging). SBA (last 2 sets with no shoes).   SHOES DOFFED - obstacle course: stepping on 7 mini spiked domes alternating feet, over 8  six inch hurdles step over step, and over 5 foot 2x4 board with touchdown UE support. Completed obstacle course 4 times with CGA - min A.  - sensory discrimination: standing with feet on bumpy surface of hip machine with eyes closed and attempted to count how many bumps are under each foot (stated 13 on L and unsure on R).  - sensory discrimination with feet stepping on smooth surface (knee poster), towel folded over, or carpet with eyes closed. Felt each one with PT telling her what she is standing on, then identified them in random manner while stepping different directions as directed by PT with eyes closed. Needed min A to maintain balance. Correct about what she is standing  on every time but was unsure once.    Pt required multimodal cuing for proper technique and to facilitate improved neuromuscular control, strength, range of motion, and functional ability resulting in improved performance and form.    PT Education - 08/09/21 1714     Education Details form/technique rationale for exercises/interventions. importance of blood glucose control    Person(s) Educated Patient    Methods Explanation;Demonstration;Tactile cues;Verbal cues    Comprehension Verbalized understanding;Returned demonstration;Verbal cues required;Tactile cues required;Need further instruction              PT Short Term Goals - 07/20/21 2022       PT SHORT TERM GOAL #1   Title Patient will be independent with inital home exercise program to improve strength/mobility/balance and functional independence with ADLs.    Baseline HEP to be provided at 2nd visit as appropriate (07/19/2021);    Time 2    Period Weeks    Status New    Target Date 08/03/21               PT Long Term Goals - 07/20/21 2023       PT LONG TERM GOAL #1   Title Patient will be independent with long term home exercise program to improve strength/mobility/balance and functional independence with ADLs.    Baseline initial HEP to be provided at visit 2 as appropriate (07/19/2021);    Time 12    Period Weeks    Status New   TARGET DATE FOR ALL LONG TERM GOALS: 10/11/2021     PT LONG TERM GOAL #2   Title Demonstrate improved FOTO score by 10 units to demonstrate improvement in overall condition and self-reported functional ability.    Baseline 50 (07/19/2021);    Time 12    Period Weeks    Status New      PT LONG TERM GOAL #3   Title Patient will score equal or greater than 25/30 on Functional Gait Assessment to demonstrate reduction in fall risk to low fall risk classification.    Baseline 20/30 (07/19/2021);    Time 12    Period Weeks    Status New      PT LONG TERM GOAL #4   Title Patiet will  demonstrate ability to stand equal or greater than 30 seconds in tandem stance with each foot front to improve her ability to ambulate without falling.n    Baseline R front 11 seconds, L front 11 seconds (07/19/2021);    Time 12    Period Weeks    Status New      PT  LONG TERM GOAL #5   Title Complete community, work and/or recreational activities without limitation due to current condition.    Baseline Feels very self conscious of people watching and judging how she moves, hard to complete her daily activities such as cooking and going out in the community because she constantly feels unsteady (07/19/2021);    Time 12    Period Weeks    Status New                   Plan - 08/09/21 1713     Clinical Impression Statement Patient tolerated treatment well with some increase in tingling in her feet after stepping on spiked domes. Interventions focused on sensory feedback through the feet and exercises challenging all aspects of balance system. Patient continues to be challenged by exercises and reports feeling nausea occasionally. Patient received continued education on importance of blood glucose control to prevent or slow worsening of neuropathy. Patient was very engaged with sensory exercises. Plan to continue with similar interventions with progressions as appropriate next session. Patient would benefit from continued management of limiting condition by skilled physical therapist to address remaining impairments and functional limitations to work towards stated goals and return to PLOF or maximal functional independence.    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    Examination-Activity Limitations Bathing;Bend;Hygiene/Grooming;Dressing;Carry;Lift;Locomotion Level;Stairs;Squat;Stand;Transfers;Other;Caring for [employer]   walking, stairs, lifting items including groceries, balance, bending over, moving her neck, performing ADL's such as getting dressed, showering, bathing.   Examination-Participation Restrictions Driving;Cleaning;Interpersonal Relationship;Yard Work;Community Activity;Laundry;Shop   Feels very self conscious of people watching and judging how she moves, hard to complete her daily activities such as cooking and going out in the community because she constantly feels unsteady.   Stability/Clinical Decision Making Evolving/Moderate complexity    Rehab Potential Fair    PT Frequency 2x / week    PT Duration 12 weeks    PT Treatment/Interventions Cryotherapy;ADLs/Self Care Home Management;Moist Heat;Gait training;Stair training;Therapeutic activities;Functional mobility training;Therapeutic exercise;Balance training;Neuromuscular re-education;Patient/family education;Manual techniques;Passive range of motion;Vestibular;Dry needling;Biofeedback;Canalith Repostioning;DME Instruction    PT Next Visit Plan update HEP, work on balance interventions to include vestibular training    PT [place] Access Code: [email or web address]    Consulted and Agree with Plan of Care Patient             Patient will benefit from skilled therapeutic intervention in order to improve the following deficits and impairments:  Impaired perceived functional ability, Abnormal gait, Decreased  activity tolerance, Dizziness, Impaired sensation, Decreased balance, Decreased mobility, Difficulty walking, Cardiopulmonary status limiting activity, Improper body mechanics, Decreased coordination, Increased muscle spasms  Visit Diagnosis: Unsteadiness on feet  Dizziness and giddiness  History of falling  Difficulty in walking, not elsewhere classified     Problem List Patient Active Problem List   Diagnosis Date Noted   Cervical myelopathy ([place]) 11/15/2020   LVH (left ventricular hypertrophy) 10/14/2020   COVID-19 05/19/2020   MRSA cellulitis 04/19/2018  Decreased libido 02/04/2018   Diabetes mellitus with neuropathy ([place]) 04/20/2017   H/O adenomatous polyp of colon 08/20/2015   Rupture of implant of right breast 05/18/2015   Anxiety disorder 04/26/2015   Allergic rhinitis 04/21/2015   Anemia 04/21/2015   Asthma 04/21/2015   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/21/2015   Chronic infection of sinus 04/21/2015   Depression 04/21/2015   GERD (gastroesophageal reflux disease) 04/21/2015   Headache 04/21/2015   Menopausal symptoms 04/21/2015   Obesity 04/21/2015   Palpitations 04/21/2015   Vitamin B12 deficiency 04/21/2015   Aortic stenosis 31/05/1623   Diastolic dysfunction 46/95/0722   Diabetes mellitus out of control 06/12/2006   Barrett esophagus 06/13/2003   Essential hypertension 06/12/1998   Genital herpes 06/12/1998   Hyperlipidemia, mixed 06/12/1998   Everlean Alstrom. Graylon Good, PT, DPT 08/09/21, 5:15 PM   [place] [facility] [address], [place], [address] Phone: [phone]   Fax:  [phone]  Name: Katie Buckley MRN: [id number] Date of Birth: 10-22-1950

## 2021-08-11 ENCOUNTER — Ambulatory Visit: Payer: PPO | Admitting: Physical Therapy

## 2021-08-16 ENCOUNTER — Encounter: Payer: PPO | Admitting: Physical Therapy

## 2021-08-18 ENCOUNTER — Other Ambulatory Visit: Payer: Self-pay

## 2021-08-18 ENCOUNTER — Encounter: Payer: Self-pay | Admitting: Physical Therapy

## 2021-08-18 ENCOUNTER — Ambulatory Visit: Payer: PPO | Attending: Neurosurgery | Admitting: Physical Therapy

## 2021-08-18 DIAGNOSIS — R2681 Unsteadiness on feet: Secondary | ICD-10-CM

## 2021-08-18 DIAGNOSIS — R42 Dizziness and giddiness: Secondary | ICD-10-CM | POA: Insufficient documentation

## 2021-08-18 DIAGNOSIS — Z9181 History of falling: Secondary | ICD-10-CM | POA: Insufficient documentation

## 2021-08-18 DIAGNOSIS — R262 Difficulty in walking, not elsewhere classified: Secondary | ICD-10-CM | POA: Insufficient documentation

## 2021-08-18 NOTE — Therapy (Signed)
[facility] [address], [place], [address] Phone: [phone]   Fax:  [phone]  Physical Therapy Treatment  Patient Details  Name: Katie Buckley MRN: [id number] Date of Birth: July 17, 1950 Referring Provider (PT): Katie Books, MD (neurology)   Encounter Date: 08/18/2021   PT End of Session - 08/18/21 1948     Visit Number 7    Number of Visits 24    Date for PT Re-Evaluation 10/11/21    Authorization Type [employer] reporting period from 07/19/2021    Progress Note Due on Visit 10    PT Start Time 1605    PT Stop Time 1645    PT Time Calculation (min) 40 min    Equipment Utilized During Treatment Gait belt    Activity Tolerance Patient tolerated treatment well    Behavior During Therapy [facility] for tasks assessed/performed             Past Medical History:  Diagnosis Date   Anxiety    Aortic stenosis, mild    a.) mean gradient 18.7 mmHg on 10/2020 TTE   Depression    Dizziness    patient states went to urgent care 05/19/2021 and no medications, then went to [facility] ER but left before being seen.  Did not receive any new medications.   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   T2DM (type 2 diabetes mellitus) ([place])     Past Surgical History:  Procedure Laterality Date   ANTERIOR CERVICAL DECOMP/DISCECTOMY FUSION N/A 11/15/2020   Procedure: ANTERIOR CERVICAL DECOMPRESSION/DISCECTOMY FUSION 1 LEVEL C3/4;  Surgeon: Katie Perla, MD;  Location: [facility] ORS;  Service: Neurosurgery;  Laterality: N/A;   AUGMENTATION MAMMAPLASTY Bilateral    breast  implants   Bone Spur  2007   foot   CARDIAC CATHETERIZATION     Carpal Tunnel Symdrome  2008   as repeated in 2009   CATARACT EXTRACTION W/PHACO Left 01/16/2019   Procedure: CATARACT EXTRACTION PHACO AND INTRAOCULAR LENS PLACEMENT ([place]) LEFT DIABETES VISION BLUE;  Surgeon: Katie Meiers, MD;  Location: [facility] ORS;  Service: Ophthalmology;  Laterality: Left;  [place]  00:47 CDE 6.62 Fluid pack lot # [id number]   CATARACT EXTRACTION W/PHACO Right 02/06/2019   Procedure: CATARACT EXTRACTION PHACO AND INTRAOCULAR LENS PLACEMENT (IOC);  Surgeon: Katie Meiers, MD;  Location: [facility] ORS;  Service: Ophthalmology;  Laterality: Right;  [place] 00:54.6 CDE 6.52 FLUID PACK LOT # [id number]   CHOLECYSTECTOMY  2007   COLONOSCOPY WITH PROPOFOL N/A 11/17/2015   Procedure: COLONOSCOPY WITH PROPOFOL;  Surgeon: Katie Silvas, MD;  Location: [facility] ENDOSCOPY;  Service: Endoscopy;  Laterality: N/A;   ESOPHAGOGASTRODUODENOSCOPY (EGD) WITH PROPOFOL N/A 11/17/2015   Procedure: ESOPHAGOGASTRODUODENOSCOPY (EGD) WITH PROPOFOL;  Surgeon: Katie Silvas, MD;  Location: [facility] ENDOSCOPY;  Service: Endoscopy;  Laterality: N/A;   EYE SURGERY Bilateral    cataract extraction   FOOT SURGERY     INTRAVASCULAR PRESSURE WIRE/FFR STUDY N/A 05/24/2021   Procedure: INTRAVASCULAR PRESSURE WIRE/FFR STUDY;  Surgeon: Katie Kida, MD;  Location:  [place] CV LAB;  Service: Cardiovascular;  Laterality: N/A;   MANDIBLE SURGERY     NASAL SINUS SURGERY  03/2013   Multile surgery Dr. Carlis Buckley. DX eosinophilic sinusitis [id number]   Release of Trigger Finger Right 10/2010   Dr. Tamala Julian   RIGHT/LEFT HEART CATH AND CORONARY ANGIOGRAPHY N/A 05/24/2021   Procedure: RIGHT/LEFT HEART CATH AND CORONARY ANGIOGRAPHY;  Surgeon: Katie Kida, MD;  Location: [place] CV LAB;  Service: Cardiovascular;  Laterality: N/A;   TONSILLECTOMY  1960    There were no vitals filed for this visit.   Subjective Assessment - 08/18/21 1610     Subjective Patient  reports she is under a lot of stress right now. She has 3/10 neck pain and 4/10 low back pain. She state her balance is a little off today. She had to go pick her daughter up the other day and she is staying with her. Patient reports she has been moving boxes and things. No falls but a couple of close falls .    Pertinent History Patient is a 71 y.o. female who presents to outpatient physical therapy with a referral for medical diagnosis imbalance. This patient's chief complaints consist of unsteadiness on feet, feeling of heaviness in eyes, history of falling, double vision, feeling clouded cognitively leading to the following functional deficits: she doesn't feel clear-headed, she has become very self-conscious that when she looks at people, she is reluctant to make eye contact (not like her), she is having facial tics that bothers her, she feels she is looking funny when people look at her. She thinks because of this fogginess in her head it makes her balance worse. Hard to do things period because she is always feeling she is unsteady. 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 Patient denies history of stroke, seizure, lung problems, bowel or bladder changes, groin numbness or tingling.  Patient denies hx of cancer, stroke, seizures, lung problem, major cardiac events, diabetes, unexplained weight loss, changes in bowel or bladder problems, new onset stumbling or  dropping things apart from described below.    Limitations Lifting;Walking;Standing;House hold activities;Other (comment)   Feels very self conscious of people watching and judging how she moves, hard to complete her daily activities such as cooking and going out in the community because she constantly feels unsteady.   Currently in Pain? Yes    Pain Score 4                 TREATMENT:    Neuromuscular Re-education: to improve, balance, postural strength, muscle activation patterns, and stabilization strength required for functional activities:  SHOES DOFFED  - tandem stance balance on firm surface, 2x1 min each foot front, B UE support as needed.  - half tandem stance on compliant surface eyes open head turns, intermittent UE support as needed, 2x20 each direction. - half tandem stance with eyes closed on firm surface, 3x60, SBA. Last set standing in corner to demonstrate how she can use a corner at home.  - standing foot taps on 8.5 inch  step progressing  to not holding on or looking at feet,  3x20 each side, SBA - Education on HEP including handout  - obstacle course: stepping on 2 mini spiked domes alternating feet, over 8  six inch hurdles step over step, and over 5 foot 2x4 board with touchdown UE support. Completed obstacle course 4 times with CGA - min A.   Pt required multimodal cuing for proper technique and to facilitate improved neuromuscular control, strength, range of motion, and functional ability resulting in improved performance and form.     HOME EXERCISE PROGRAM Access Code: [email or web address] URL: [email or web address] Date: 08/18/2021 Prepared by: Rosita Kea  Exercises Standing Tandem Balance with Unilateral Counter Support - 3-5 x weekly - 2 sets - 1 minute hold Half Tandem Stance Balance with Head Rotation - 3-5 x weekly - 2 sets - 20 reps Half Tandem Stance Balance with Eyes Closed - 3-5 x weekly - 2 sets - 1 minute hold Standing Toe Taps - 3-5 x  weekly - 3 sets - 20 reps    PT Education - 08/18/21 1948     Education Details form/technique rationale for exercises/interventions.    Person(s) Educated Patient    Methods Explanation;Demonstration;Tactile cues;Verbal cues;Handout    Comprehension Verbalized understanding;Returned demonstration;Tactile cues required;Verbal cues required;Need further instruction              PT Short Term Goals - 07/20/21 2022       PT SHORT TERM GOAL #1   Title Patient will be independent with inital home exercise program to improve strength/mobility/balance and functional independence with ADLs.    Baseline HEP to be provided at 2nd visit as appropriate (07/19/2021);    Time 2    Period Weeks    Status New    Target Date 08/03/21               PT Long Term Goals - 07/20/21 2023       PT LONG TERM GOAL #1   Title Patient will be independent with long term home exercise program to improve strength/mobility/balance and functional independence with ADLs.    Baseline initial HEP to be provided at visit 2 as appropriate (07/19/2021);    Time 12    Period Weeks    Status New   TARGET DATE FOR ALL LONG TERM GOALS: 10/11/2021     PT LONG TERM GOAL #2   Title Demonstrate improved FOTO score by 10 units to demonstrate improvement in overall condition and self-reported functional ability.    Baseline 50 (07/19/2021);    Time 12    Period Weeks    Status New      PT LONG TERM GOAL #3   Title Patient will score equal or greater than 25/30 on Functional Gait Assessment to demonstrate reduction in fall risk to low fall risk classification.    Baseline 20/30 (07/19/2021);    Time 12    Period Weeks    Status New      PT LONG TERM GOAL #4   Title Patiet will demonstrate ability to stand equal or greater than 30 seconds in tandem stance with each foot front to improve her ability to ambulate without falling.n    Baseline R front 11 seconds, L front 11 seconds (07/19/2021);    Time 12    Period  Weeks    Status New      PT LONG TERM GOAL #5   Title Complete community, work and/or recreational activities without limitation due to current condition.  Baseline Feels very self conscious of people watching and judging how she moves, hard to complete her daily activities such as cooking and going out in the community because she constantly feels unsteady (07/19/2021);    Time 12    Period Weeks    Status New                   Plan - 08/18/21 1948     Clinical Impression Statement Patient tolerated treatment well overall with no complaint of increased pain by end of session. Session focused on updating HEP and continuing static and dynamic balance training. Patient continues to be challenged by various environments that require different balance components to be dominant. Plan to continue working on static and dynamic balance at next session as appropriate. Patient would benefit from continued management of limiting condition by skilled physical therapist to address remaining impairments and functional limitations to work towards stated goals and return to PLOF or maximal functional independence.    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    Examination-Activity Limitations  Bathing;Bend;Hygiene/Grooming;Dressing;Carry;Lift;Locomotion Level;Stairs;Squat;Stand;Transfers;Other;Caring for [employer]   walking, stairs, lifting items including groceries, balance, bending over, moving her neck, performing ADL's such as getting dressed, showering, bathing.   Examination-Participation Restrictions Driving;Cleaning;Interpersonal Relationship;Yard Work;Community Activity;Laundry;Shop   Feels very self conscious of people watching and judging how she moves, hard to complete her daily activities such as cooking and going out in the community because she constantly feels unsteady.   Stability/Clinical Decision Making Evolving/Moderate complexity    Rehab Potential Fair    PT Frequency 2x / week    PT Duration 12 weeks    PT Treatment/Interventions Cryotherapy;ADLs/Self Care Home Management;Moist Heat;Gait training;Stair training;Therapeutic activities;Functional mobility training;Therapeutic exercise;Balance training;Neuromuscular re-education;Patient/family education;Manual techniques;Passive range of motion;Vestibular;Dry needling;Biofeedback;Canalith Repostioning;DME Instruction    PT Next Visit Plan update HEP, work on balance interventions to include vestibular training    PT [place] Access Code: [email or web address]    Consulted and Agree with Plan of Care Patient             Patient will benefit from skilled therapeutic intervention in order to improve the following deficits and impairments:  Impaired perceived functional ability, Abnormal gait, Decreased activity tolerance, Dizziness, Impaired sensation, Decreased balance, Decreased mobility, Difficulty walking, Cardiopulmonary status limiting activity, Improper body mechanics, Decreased coordination, Increased muscle spasms  Visit Diagnosis: Unsteadiness on feet  Dizziness and giddiness  History of falling  Difficulty in walking, not elsewhere classified     Problem List Patient Active  Problem List   Diagnosis Date Noted   Cervical myelopathy ([place]) 11/15/2020   LVH (left ventricular hypertrophy) 10/14/2020   COVID-19 05/19/2020   MRSA cellulitis 04/19/2018   Decreased libido 02/04/2018   Diabetes mellitus with neuropathy ([place]) 04/20/2017   H/O adenomatous polyp of colon 08/20/2015   Rupture of implant of right breast 05/18/2015   Anxiety disorder 04/26/2015   Allergic rhinitis 04/21/2015   Anemia 04/21/2015   Asthma  04/21/2015   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/21/2015   Chronic infection of sinus 04/21/2015   Depression 04/21/2015   GERD (gastroesophageal reflux disease) 04/21/2015   Headache 04/21/2015   Menopausal symptoms 04/21/2015   Obesity 04/21/2015   Palpitations 04/21/2015   Vitamin B12 deficiency 04/21/2015   Aortic stenosis 26/83/4196   Diastolic dysfunction 22/29/7989   Diabetes mellitus out of control 06/12/2006   Barrett esophagus 06/13/2003   Essential hypertension 06/12/1998   Genital herpes 06/12/1998   Hyperlipidemia, mixed 06/12/1998    Everlean Alstrom. Graylon Good, PT, DPT 08/18/21, 7:50 PM   [facility] [address], [place], [address] Phone: [phone]   Fax:  [phone]  Name: Katie Buckley MRN: [id number] Date of Birth: June 16, 1950

## 2021-08-23 ENCOUNTER — Ambulatory Visit: Payer: PPO | Admitting: Physical Therapy

## 2021-08-25 ENCOUNTER — Encounter: Payer: Self-pay | Admitting: Physical Therapy

## 2021-08-25 ENCOUNTER — Ambulatory Visit: Payer: PPO | Admitting: Physical Therapy

## 2021-08-25 ENCOUNTER — Other Ambulatory Visit: Payer: Self-pay

## 2021-08-25 DIAGNOSIS — R2681 Unsteadiness on feet: Secondary | ICD-10-CM | POA: Diagnosis not present

## 2021-08-25 NOTE — Therapy (Signed)
[facility] [place] [facility] ?[address] ?[place], [place], [address] ?Phone: [phone]   Fax:  [phone] ? ?Physical Therapy Treatment ? ?Patient Details  ?Name: Gore ?MRN: [id number] ?Date of Birth: [birth year] ?Referring Provider (PT): Jennings Books, MD (neurology) ? ? ?Encounter Date: 08/25/2021 ? ? PT End of Session - 08/25/21 1800   ? ? Visit Number 8   ? Number of Visits 24   ? Date for PT Re-Evaluation 10/11/21   ? Authorization Type [employer] reporting period from 07/19/2021   ? Progress Note Due on Visit 10   ? PT Start Time 6962   ? PT Stop Time 9528   ? PT Time Calculation (min) 40 min   ? Equipment Utilized During Treatment Gait belt   ? Activity Tolerance Patient tolerated treatment well   ? Behavior During Therapy [facility] for tasks assessed/performed   ? ?  ?  ? ?  ? ? ?Past Medical History:  ?Diagnosis Date  ? Anxiety   ? Aortic stenosis, mild   ? a.) mean gradient 18.7 mmHg on 10/2020 TTE  ? Depression   ? Dizziness   ? patient states went to urgent care 05/19/2021 and no medications, then went to [facility] ER but left before being seen.  Did not receive any new medications.  ? Frequent sinus infections   ? GERD (gastroesophageal reflux disease) 04/21/2015  ? H/O tension headache   ? Heart murmur   ? History of Barrett's esophagus   ? Hyperchloremia   ? Hypertension   ? MRSA (methicillin resistant staph aureus) culture positive 2018  ? history of, in hand  ? Neuropathy   ? Palpitations   ? Seasonal allergies   ? Skin cancer   ? 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 T2DM (type 2 diabetes mellitus) ([place])   ? ? ?Past Surgical History:  ?Procedure Laterality Date  ? ANTERIOR CERVICAL DECOMP/DISCECTOMY FUSION N/A 11/15/2020  ? Procedure: ANTERIOR CERVICAL DECOMPRESSION/DISCECTOMY FUSION 1 LEVEL C3/4;  Surgeon: Deetta Perla, MD;  Location: [facility] ORS;  Service: Neurosurgery;  Laterality: N/A;  ? AUGMENTATION MAMMAPLASTY Bilateral   ? breast  implants  ? Bone Spur  2007  ? foot  ? CARDIAC CATHETERIZATION    ? Carpal Tunnel Symdrome  2008  ? as repeated in 2009  ? CATARACT EXTRACTION W/PHACO Left 01/16/2019  ? Procedure: CATARACT EXTRACTION PHACO AND INTRAOCULAR LENS PLACEMENT ([place]) LEFT DIABETES VISION BLUE;  Surgeon: Marchia Meiers, MD;  Location: [facility] ORS;  Service: Ophthalmology;  Laterality: Left;  [place]  00:47 ?CDE 6.62 ?Fluid pack lot # [id number]  ? CATARACT EXTRACTION W/PHACO Right 02/06/2019  ? Procedure: CATARACT EXTRACTION PHACO AND INTRAOCULAR LENS PLACEMENT ([facility]);  Surgeon: Marchia Meiers, MD;  Location: [facility] ORS;  Service: Ophthalmology;  Laterality: Right;  [place] 00:54.6 ?CDE 6.52 ?FLUID PACK LOT # [id number]  ? CHOLECYSTECTOMY  2007  ? COLONOSCOPY WITH PROPOFOL N/A 11/17/2015  ? Procedure: COLONOSCOPY WITH PROPOFOL;  Surgeon: Manya Silvas, MD;  Location: [facility] ENDOSCOPY;  Service: Endoscopy;  Laterality: N/A;  ? ESOPHAGOGASTRODUODENOSCOPY (EGD) WITH PROPOFOL N/A 11/17/2015  ? Procedure: ESOPHAGOGASTRODUODENOSCOPY (EGD) WITH PROPOFOL;  Surgeon: Manya Silvas, MD;  Location: [facility] ENDOSCOPY;  Service: Endoscopy;  Laterality: N/A;  ? EYE SURGERY Bilateral   ? cataract extraction  ? FOOT SURGERY    ? INTRAVASCULAR PRESSURE WIRE/FFR STUDY N/A 05/24/2021  ? Procedure: INTRAVASCULAR PRESSURE WIRE/FFR STUDY;  Surgeon: Yolonda Kida, MD;  Location:  [place] CV LAB;  Service: Cardiovascular;  Laterality: N/A;  ? MANDIBLE SURGERY    ? NASAL SINUS SURGERY  03/2013  ? Multile surgery Dr. Carlis Abbott. DX eosinophilic sinusitis [id number]  ? Release of Trigger Finger Right 10/2010  ? Dr. Tamala Julian  ? RIGHT/LEFT HEART CATH AND CORONARY ANGIOGRAPHY N/A 05/24/2021  ? Procedure: RIGHT/LEFT HEART CATH AND CORONARY ANGIOGRAPHY;  Surgeon: Yolonda Kida, MD;  Location: [place] CV LAB;  Service: Cardiovascular;  Laterality: N/A;  ? TONSILLECTOMY  1960  ? ? ?There were no vitals filed for this visit. ? ? Subjective Assessment - 08/25/21 1609   ? ? Subjective Patient  reports she feeling stressed. Her daughter who is staying with her had to go to the hospital for vertigo this week and just got back. [name] state she has 2/10 pain in her neck when she moves her head. She states her undsteadiness is still there but not as bad as it has been before. She is really cautious about certain movements so she does not fall. She notices when she takes in groceries that if she doesn't balance them equally on both sides she pulls towards the heavier side and it causes her to stumble. She has not done her HEP since last session due to disruptions in her personal life.   ? Pertinent History Patient is a 71 y.o. female who presents to outpatient physical therapy with a referral for medical diagnosis imbalance. This patient's chief complaints consist of unsteadiness on feet, feeling of heaviness in eyes, history of falling, double vision, feeling clouded cognitively leading to the following functional deficits: she doesn't feel clear-headed, she has become very self-conscious that when she looks at people, she is reluctant to make eye contact (not like her), she is having facial tics that bothers her, she feels she is looking funny when people look at her. She thinks because of this fogginess in her head it makes her balance worse. Hard to do things period because she is always feeling she is unsteady. 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 Patient  denies history of stroke, seizure, lung problems, bowel or bladder changes, groin numbness or tingling.  Patient denies hx of cancer, stroke, seizures, lung problem, major cardiac events, diabetes, unexplained weight loss, changes in bowel or bladder problems, new onset stumbling or dropping things apart from described below.   ? Limitations Lifting;Walking;Standing;House hold activities;Other (comment)   Feels very self conscious of people watching and judging how she moves, hard to complete her daily activities such as cooking and going out in the community because she constantly feels unsteady.  ? Currently in Pain? Yes   ? Pain Score 2    ? ?  ?  ? ?  ? ? ? ?TREATMENT:  ?  ? ? ?Neuromuscular Re-education: to improve, balance, postural strength, muscle activation patterns, and stabilization strength required for functional activities: ?  ?SHOES DOFFED ?  ?- tandem stance balance on firm surface, 2x1 min each foot front, B UE support as needed.  ?- standing foot taps on 8.5 inch  step while standing on airex pad progressing to not holding on or looking at feet,  3x20 each side, SBA ?- obstacle course: lateral stepping along 5 foot aeromat, stepping on 2 mini spiked domes alternating feet, on 1 spiked dynadisc, over 8  six inch hurdles step over step, and over 5 foot 2x4 board with touchdown UE support. Completed obstacle course 4 times with CGA - min A.  ? ?Therapeutic exercise: to centralize symptoms and improve ROM, strength, muscular endurance, and activity tolerance required for successful completion of functional activities.  ?- B gastroc stretch on mildly slanted board with B UE support, 3x30 seconds ?- standing B ankle PF/DF on rocker board with B UE support, 3x20 with cuing to keep hips still.  ?  ?Pt required multimodal cuing for proper technique and to facilitate improved neuromuscular control, strength, range of motion, and functional ability resulting in improved performance and form. ?  ?  ?  ?HOME  EXERCISE PROGRAM ?Access Code: [email or web address] ?URL: [email or web address] ?Date: 08/18/2021 ?Prepared by: Rosita Kea ?  ?Exercises ?Standing Tandem Balance with Unilateral Counter Support - 3-5 x we

## 2021-08-29 DIAGNOSIS — F411 Generalized anxiety disorder: Secondary | ICD-10-CM | POA: Diagnosis not present

## 2021-08-29 DIAGNOSIS — F331 Major depressive disorder, recurrent, moderate: Secondary | ICD-10-CM | POA: Diagnosis not present

## 2021-08-30 ENCOUNTER — Other Ambulatory Visit: Payer: Self-pay

## 2021-08-30 ENCOUNTER — Ambulatory Visit: Payer: PPO | Admitting: Physical Therapy

## 2021-08-30 DIAGNOSIS — R262 Difficulty in walking, not elsewhere classified: Secondary | ICD-10-CM

## 2021-08-30 DIAGNOSIS — R2681 Unsteadiness on feet: Secondary | ICD-10-CM

## 2021-08-30 DIAGNOSIS — R42 Dizziness and giddiness: Secondary | ICD-10-CM

## 2021-08-30 DIAGNOSIS — Z9181 History of falling: Secondary | ICD-10-CM

## 2021-08-30 NOTE — Therapy (Signed)
?OUTPATIENT PHYSICAL THERAPY TREATMENT NOTE ? ? ?Patient Name: Katie Buckley ?MRN: [id number] ?DOB:02-20-1951, 71 y.o., female ?Today's Date: 08/30/2021 ? ?PCP: [name], MD ?REFERRING PROVIDER: Vladimir Crofts, MD ? ? PT End of Session - 08/30/21 1606   ? ? Visit Number 9   ? Number of Visits 24   ? Date for PT Re-Evaluation 10/11/21   ? Authorization Type [employer] reporting period from 07/19/2021   ? Progress Note Due on Visit 10   ? PT Start Time 9622   ? PT Stop Time 2979   ? PT Time Calculation (min) 38 min   ? Equipment Utilized During Treatment Gait belt   ? Activity Tolerance Patient tolerated treatment well   ? Behavior During Therapy [facility] for tasks assessed/performed   ? ?  ?  ? ?  ? ? ?Past Medical History:  ?Diagnosis Date  ? Anxiety   ? Aortic stenosis, mild   ? a.) mean gradient 18.7 mmHg on 10/2020 TTE  ? Depression   ? Dizziness   ? patient states went to urgent care 05/19/2021 and no medications, then went to [facility] ER but left before being seen.  Did not receive any new medications.  ? Frequent sinus infections   ? GERD (gastroesophageal reflux disease) 04/21/2015  ? H/O tension headache   ? Heart murmur   ? History of Barrett's esophagus   ? Hyperchloremia   ? Hypertension   ? MRSA (methicillin resistant staph aureus) culture positive 2018  ? history of, in hand  ? Neuropathy   ? Palpitations   ? Seasonal allergies   ? Skin cancer   ? 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 T2DM (type 2 diabetes mellitus) ([place])   ? ?Past Surgical History:  ?Procedure Laterality Date  ? ANTERIOR CERVICAL DECOMP/DISCECTOMY FUSION N/A 11/15/2020  ? Procedure: ANTERIOR CERVICAL DECOMPRESSION/DISCECTOMY FUSION 1 LEVEL C3/4;  Surgeon: Deetta Perla, MD;  Location: [facility] ORS;  Service: Neurosurgery;  Laterality: N/A;  ? AUGMENTATION MAMMAPLASTY Bilateral   ? breast implants  ? Bone Spur  2007  ? foot  ? CARDIAC CATHETERIZATION    ? Carpal Tunnel Symdrome  2008  ? as repeated in 2009  ? CATARACT EXTRACTION  W/PHACO Left 01/16/2019  ? Procedure: CATARACT EXTRACTION PHACO AND INTRAOCULAR LENS PLACEMENT ([place]) LEFT DIABETES VISION BLUE;  Surgeon: Marchia Meiers, MD;  Location: [facility] ORS;  Service: Ophthalmology;  Laterality: Left;  [place]  00:47 ?CDE 6.62 ?Fluid pack lot # [id number]  ? CATARACT EXTRACTION W/PHACO Right 02/06/2019  ? Procedure: CATARACT EXTRACTION PHACO AND INTRAOCULAR LENS PLACEMENT (IOC);  Surgeon: Marchia Meiers, MD;  Location: [facility] ORS;  Service: Ophthalmology;  Laterality: Right;  [place] 00:54.6 ?CDE 6.52 ?FLUID PACK LOT # [id number]  ? CHOLECYSTECTOMY  2007  ? COLONOSCOPY WITH PROPOFOL N/A 11/17/2015  ? Procedure: COLONOSCOPY WITH PROPOFOL;  Surgeon: Manya Silvas, MD;  Location: [facility] ENDOSCOPY;  Service: Endoscopy;  Laterality: N/A;  ? ESOPHAGOGASTRODUODENOSCOPY (EGD) WITH PROPOFOL N/A 11/17/2015  ? Procedure: ESOPHAGOGASTRODUODENOSCOPY (EGD) WITH PROPOFOL;  Surgeon: Manya Silvas, MD;  Location: [facility] ENDOSCOPY;  Service: Endoscopy;  Laterality: N/A;  ? EYE SURGERY Bilateral   ? cataract extraction  ? FOOT SURGERY    ? INTRAVASCULAR PRESSURE WIRE/FFR STUDY N/A 05/24/2021  ? Procedure: INTRAVASCULAR PRESSURE WIRE/FFR STUDY;  Surgeon: Yolonda Kida, MD;  Location: [place] CV LAB;  Service: Cardiovascular;  Laterality: N/A;  ? MANDIBLE SURGERY    ? NASAL SINUS SURGERY  03/2013  ? Multile surgery Dr. Carlis Abbott. DX eosinophilic sinusitis [id number]  ? Release of Trigger Finger Right 10/2010  ? Dr. Tamala Julian  ? RIGHT/LEFT HEART CATH AND CORONARY ANGIOGRAPHY N/A 05/24/2021  ? Procedure: RIGHT/LEFT HEART CATH AND CORONARY ANGIOGRAPHY;  Surgeon: Yolonda Kida, MD;  Location: [place] CV LAB;  Service: Cardiovascular;  Laterality: N/A;  ? TONSILLECTOMY  1960  ? ?Patient Active Problem List  ? Diagnosis Date Noted  ? Cervical myelopathy ([place]) 11/15/2020  ? LVH (left ventricular hypertrophy) 10/14/2020  ? COVID-19 05/19/2020  ? MRSA cellulitis 04/19/2018  ? Decreased libido 02/04/2018  ? Diabetes mellitus with  neuropathy ([place]) 04/20/2017  ? H/O adenomatous polyp of colon 08/20/2015  ? Rupture of implant of right breast 05/18/2015  ? Anxiety disorder 04/26/2015  ? Allergic rhinitis 04/21/2015  ? Anemia 04/21/2015  ? Asthma 04/21/2015  ? 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/21/2015  ? Chronic infection of sinus 04/21/2015  ? Depression 04/21/2015  ? GERD (gastroesophageal reflux disease) 04/21/2015  ? Headache 04/21/2015  ? Menopausal symptoms 04/21/2015  ? Obesity 04/21/2015  ? Palpitations 04/21/2015  ? Vitamin B12 deficiency 04/21/2015  ? Aortic stenosis 01/21/2013  ? Diastolic dysfunction 34/74/2595  ? Diabetes mellitus out of control 06/12/2006  ? Barrett esophagus 06/13/2003  ? Essential hypertension 06/12/1998  ? Genital herpes 06/12/1998  ? Hyperlipidemia, mixed 06/12/1998  ? ? ?REFERRING DIAG: imbalance ? ?THERAPY DIAG:  ?Unsteadiness on feet ? ?Dizziness and giddiness ? ?History of falling ? ?Difficulty in walking, not elsewhere classified ? ?PERTINENT HISTORY: Patient is a 71 y.o. female who presents to outpatient physical therapy with a referral for medical diagnosis imbalance. This patient's chief complaints consist of unsteadiness on feet, feeling of heaviness in eyes, history of falling, double vision, feeling clouded cognitively leading to the following functional deficits: she doesn't feel clear-headed, she has become very self-conscious that when she looks at people, she is reluctant to make eye contact (not like her), she is having facial tics that bothers her, she feels she is looking funny when people look at her. She thinks because of this fogginess in her head it makes her balance worse. Hard to do things period because she is always feeling she is unsteady. 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 Patient denies history of stroke, seizure, lung problems, bowel or bladder changes, groin numbness or tingling. Patient denies hx of cancer, stroke, seizures, lung problem, major cardiac events, diabetes, unexplained weight loss, changes in bowel or bladder problems, new onset stumbling or dropping things apart from described below. ? ?PRECAUTIONS: falls ? ?SUBJECTIVE: Patient reports she is feeling okay today and her balance has been pretty good today. She states she has not done anything "trying" to cause any problems. She reports no pain. She states she has not got time to do her HEP lately.  ? ?PAIN:  ?Are you having pain? No ? ?TODAY'S TREATMENT:  ?Neuromuscular Re-education: to improve, balance, postural strength, muscle activation patterns, and stabilization strength required for functional activities: ?  ?SHOES DOFFED ?  ?- tandem stance balance on  firm surface, 2x1 min each foot front, B UE support as needed.  ?- standing foot taps on 8.5 inch  step while standing on airex pad progressing to not holding on or looking at feet,  3x20 each side, SBA ?- narrow stance with eyes closed on compliant surface (airex), 3x60, SBA. ?- obstacle course: lateral stepping along 5 foot aeromat, stepping on 2 mini spiked domes alternating feet, on 1 spiked dynadisc, over 8  six inch hurdles step over step, and over 5 foot 2x4 board with touchdown UE support. Completed obstacle course 4 times with CGA - min A.  ?  ?Therapeutic exercise: to centralize symptoms and improve ROM, strength, muscular endurance, and activity tolerance required for successful completion of functional activities.  ?- B  gastroc stretch on 20 degree slant board with B UE support, 3x30 seconds (shoes on due to socks sliding on board).  ?- standing B ankle PF/DF on rocker board with B UE support, 3x20 with cuing to keep hips s

## 2021-09-01 ENCOUNTER — Ambulatory Visit: Payer: PPO | Admitting: Physical Therapy

## 2021-09-01 ENCOUNTER — Other Ambulatory Visit: Payer: Self-pay

## 2021-09-01 ENCOUNTER — Encounter: Payer: Self-pay | Admitting: Physical Therapy

## 2021-09-01 DIAGNOSIS — Z9181 History of falling: Secondary | ICD-10-CM

## 2021-09-01 DIAGNOSIS — R2681 Unsteadiness on feet: Secondary | ICD-10-CM

## 2021-09-01 DIAGNOSIS — R262 Difficulty in walking, not elsewhere classified: Secondary | ICD-10-CM

## 2021-09-01 DIAGNOSIS — R42 Dizziness and giddiness: Secondary | ICD-10-CM

## 2021-09-01 NOTE — Therapy (Signed)
?OUTPATIENT PHYSICAL THERAPY TREATMENT / PROGRESS NOTE ?Dates of reporting from 07/19/2021 to 09/01/2021 ? ? ?Patient Name: Katie Buckley ?MRN: [id number] ?DOB:August 13, 1950, 71 y.o., female ?Today's Date: 09/01/2021 ? ?PCP: [name], MD ?REFERRING PROVIDER: Vladimir Crofts, MD ? ? PT End of Session - 09/01/21 1858   ? ? Visit Number 10   ? Number of Visits 24   ? Date for PT Re-Evaluation 10/11/21   ? Authorization Type [employer] reporting period from 07/19/2021   ? Progress Note Due on Visit 10   ? PT Start Time 8466   ? PT Stop Time 5993   ? PT Time Calculation (min) 38 min   ? Equipment Utilized During Treatment Gait belt   ? Activity Tolerance Patient tolerated treatment well   ? Behavior During Therapy [facility] for tasks assessed/performed   ? ?  ?  ? ?  ? ? ? ?Past Medical History:  ?Diagnosis Date  ? Anxiety   ? Aortic stenosis, mild   ? a.) mean gradient 18.7 mmHg on 10/2020 TTE  ? Depression   ? Dizziness   ? patient states went to urgent care 05/19/2021 and no medications, then went to [facility] ER but left before being seen.  Did not receive any new medications.  ? Frequent sinus infections   ? GERD (gastroesophageal reflux disease) 04/21/2015  ? H/O tension headache   ? Heart murmur   ? History of Barrett's esophagus   ? Hyperchloremia   ? Hypertension   ? MRSA (methicillin resistant staph aureus) culture positive 2018  ? history of, in hand  ? Neuropathy   ? Palpitations   ? Seasonal allergies   ? Skin cancer   ? 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 T2DM (type 2 diabetes mellitus) ([place])   ? ?Past Surgical History:  ?Procedure Laterality Date  ? ANTERIOR CERVICAL DECOMP/DISCECTOMY FUSION N/A 11/15/2020  ? Procedure: ANTERIOR CERVICAL DECOMPRESSION/DISCECTOMY FUSION 1 LEVEL C3/4;  Surgeon: Deetta Perla, MD;  Location: [facility] ORS;  Service: Neurosurgery;  Laterality: N/A;  ? AUGMENTATION MAMMAPLASTY Bilateral   ? breast implants  ? Bone Spur  2007  ? foot  ? CARDIAC CATHETERIZATION    ? Carpal Tunnel  Symdrome  2008  ? as repeated in 2009  ? CATARACT EXTRACTION W/PHACO Left 01/16/2019  ? Procedure: CATARACT EXTRACTION PHACO AND INTRAOCULAR LENS PLACEMENT ([place]) LEFT DIABETES VISION BLUE;  Surgeon: Marchia Meiers, MD;  Location: [facility] ORS;  Service: Ophthalmology;  Laterality: Left;  [place]  00:47 ?CDE 6.62 ?Fluid pack lot # [id number]  ? CATARACT EXTRACTION W/PHACO Right 02/06/2019  ? Procedure: CATARACT EXTRACTION PHACO AND INTRAOCULAR LENS PLACEMENT (IOC);  Surgeon: Marchia Meiers, MD;  Location: [facility] ORS;  Service: Ophthalmology;  Laterality: Right;  [place] 00:54.6 ?CDE 6.52 ?FLUID PACK LOT # [id number] h  ? CHOLECYSTECTOMY  2007  ? COLONOSCOPY WITH PROPOFOL N/A 11/17/2015  ? Procedure: COLONOSCOPY WITH PROPOFOL;  Surgeon: Manya Silvas, MD;  Location: [facility] ENDOSCOPY;  Service: Endoscopy;  Laterality: N/A;  ? ESOPHAGOGASTRODUODENOSCOPY (EGD) WITH PROPOFOL N/A 11/17/2015  ? Procedure: ESOPHAGOGASTRODUODENOSCOPY (EGD) WITH PROPOFOL;  Surgeon: Manya Silvas, MD;  Location: [facility] ENDOSCOPY;  Service: Endoscopy;  Laterality: N/A;  ? EYE SURGERY Bilateral   ? cataract extraction  ? FOOT SURGERY    ? INTRAVASCULAR PRESSURE WIRE/FFR STUDY N/A 05/24/2021  ? Procedure: INTRAVASCULAR PRESSURE WIRE/FFR STUDY;  Surgeon: Yolonda Kida, MD;  Location: [place] CV LAB;  Service: Cardiovascular;  Laterality: N/A;  ?  MANDIBLE SURGERY    ? NASAL SINUS SURGERY  03/2013  ? Multile surgery Dr. Carlis Abbott. DX eosinophilic sinusitis [id number]  ? Release of Trigger Finger Right 10/2010  ? Dr. Tamala Julian  ? RIGHT/LEFT HEART CATH AND CORONARY ANGIOGRAPHY N/A 05/24/2021  ? Procedure: RIGHT/LEFT HEART CATH AND CORONARY ANGIOGRAPHY;  Surgeon: Yolonda Kida, MD;  Location: [place] CV LAB;  Service: Cardiovascular;  Laterality: N/A;  ? TONSILLECTOMY  1960  ? ?Patient Active Problem List  ? Diagnosis Date Noted  ? Cervical myelopathy ([place]) 11/15/2020  ? LVH (left ventricular hypertrophy) 10/14/2020  ? COVID-19 05/19/2020  ? MRSA cellulitis 04/19/2018  ?  Decreased libido 02/04/2018  ? Diabetes mellitus with neuropathy ([place]) 04/20/2017  ? H/O adenomatous polyp of colon 08/20/2015  ? Rupture of implant of right breast 05/18/2015  ? Anxiety disorder 04/26/2015  ? Allergic rhinitis 04/21/2015  ? Anemia 04/21/2015  ? Asthma 04/21/2015  ? 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/21/2015  ? Chronic infection of sinus 04/21/2015  ? Depression 04/21/2015  ? GERD (gastroesophageal reflux disease) 04/21/2015  ? Headache 04/21/2015  ? Menopausal symptoms 04/21/2015  ? Obesity 04/21/2015  ? Palpitations 04/21/2015  ? Vitamin B12 deficiency 04/21/2015  ? Aortic stenosis 01/21/2013  ? Diastolic dysfunction 50/02/3817  ? Diabetes mellitus out of control 06/12/2006  ? Barrett esophagus 06/13/2003  ? Essential hypertension 06/12/1998  ? Genital herpes 06/12/1998  ? Hyperlipidemia, mixed 06/12/1998  ? ? ?REFERRING DIAG: imbalance ? ?THERAPY DIAG:  ?Unsteadiness on feet ? ?Dizziness and giddiness ? ?History of falling ? ?Difficulty in walking, not elsewhere classified ? ?PERTINENT HISTORY: Patient is a 71 y.o. female who presents to outpatient physical therapy with a referral for medical diagnosis imbalance. This patient's chief complaints consist of unsteadiness on feet, feeling of heaviness in eyes, history of falling, double vision, feeling clouded cognitively leading to the following functional deficits: she doesn't feel clear-headed, she has become very self-conscious that when she looks at people, she is reluctant to make eye contact (not like her), she is having facial tics that bothers her, she feels she is looking funny when people look at her. She thinks because of this fogginess in her head it makes her balance worse. Hard to do things period because she is always feeling she is unsteady. 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 Patient denies history of stroke, seizure, lung problems, bowel or bladder changes, groin numbness or tingling. Patient denies hx of cancer, stroke, seizures, lung problem, major cardiac events, diabetes, unexplained weight loss, changes in bowel or bladder problems, new onset stumbling or dropping things apart from described below. ? ?PRECAUTIONS: falls ? ?SUBJECTIVE: Patient reports she feels like PT is helping her. She states her legs feel stronger and she feels like that is helping her with her balance. She also feels like her balance is better and she has been doing a bit better on some of the tasks in PT. She has a little low back pain today. She feels less self conscious in the community and she finds it easier to do her usual  activities like cooking and going out in the community. She has not had any recent falls when out in the community lately but it still bothers her that she had those spells in the past.  ? ?PAIN:  ?Are you having pain? No and Yes: NPRS scale: 2/10 ?Pain location: low back ?  ? ?OBJECTIVE ? ?SELF-REPORTED FUNCTION ?FOTO score: 48/100 (balance questionnaire) ? ?  ?   ? ?  ? Functional Gait  Assessment  ? Gait Level Surface Walks 20 ft in less than 5.5 sec, no assistive devices, good speed, no evidence for imbalance, normal gait pattern, deviates no more than 6 in outside of the 12 in walkway width.   self selected: 6.12 second; Fast: 4.50 seconds  ? Change in Gait Speed Able to smoothly change walking speed without loss of balance or gait deviation. Deviate no more than 6 in outside of the 12 in walkway  width.   ? Gait with Horizontal Head Turns Performs head turns smoothly with no change in gait. Deviates no more than 6 in outside 12 in walkway width   ? Gait with Vertical Head Turns Performs task w

## 2021-09-06 ENCOUNTER — Encounter: Payer: Self-pay | Admitting: Physical Therapy

## 2021-09-06 ENCOUNTER — Ambulatory Visit: Payer: PPO | Admitting: Physical Therapy

## 2021-09-06 ENCOUNTER — Other Ambulatory Visit: Payer: Self-pay

## 2021-09-06 DIAGNOSIS — R2681 Unsteadiness on feet: Secondary | ICD-10-CM | POA: Diagnosis not present

## 2021-09-06 DIAGNOSIS — R42 Dizziness and giddiness: Secondary | ICD-10-CM

## 2021-09-06 DIAGNOSIS — R262 Difficulty in walking, not elsewhere classified: Secondary | ICD-10-CM

## 2021-09-06 DIAGNOSIS — Z9181 History of falling: Secondary | ICD-10-CM

## 2021-09-06 NOTE — Therapy (Signed)
?OUTPATIENT PHYSICAL THERAPY TREATMENT NOTE ? ? ?Patient Name: Katie Buckley ?MRN: [id number] ?DOB:[birth year], 71 y.o., female ?Today's Date: 09/06/2021 ? ?PCP: [name], MD ?REFERRING PROVIDER: Vladimir Crofts, MD ? ? PT End of Session - 09/06/21 1606   ? ? Visit Number 11   ? Number of Visits 24   ? Date for PT Re-Evaluation 10/11/21   ? Authorization Type [employer] reporting period from 09/01/2021   ? Progress Note Due on Visit 20   ? PT Start Time 0037   ? PT Stop Time 1642   ? PT Time Calculation (min) 38 min   ? Equipment Utilized During Treatment Gait belt   ? Activity Tolerance Patient tolerated treatment well   ? Behavior During Therapy [facility] for tasks assessed/performed   ? ?  ?  ? ?  ? ? ? ? ?Past Medical History:  ?Diagnosis Date  ? Anxiety   ? Aortic stenosis, mild   ? a.) mean gradient 18.7 mmHg on 10/2020 TTE  ? Depression   ? Dizziness   ? patient states went to urgent care 05/19/2021 and no medications, then went to [facility] ER but left before being seen.  Did not receive any new medications.  ? Frequent sinus infections   ? GERD (gastroesophageal reflux disease) 04/21/2015  ? H/O tension headache   ? Heart murmur   ? History of Barrett's esophagus   ? Hyperchloremia   ? Hypertension   ? MRSA (methicillin resistant staph aureus) culture positive 2018  ? history of, in hand  ? Neuropathy   ? Palpitations   ? Seasonal allergies   ? Skin cancer   ? 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 T2DM (type 2 diabetes mellitus) ([place])   ? ?Past Surgical History:  ?Procedure Laterality Date  ? ANTERIOR CERVICAL DECOMP/DISCECTOMY FUSION N/A 11/15/2020  ? Procedure: ANTERIOR CERVICAL DECOMPRESSION/DISCECTOMY FUSION 1 LEVEL C3/4;  Surgeon: Deetta Perla, MD;  Location: [facility] ORS;  Service: Neurosurgery;  Laterality: N/A;  ? AUGMENTATION MAMMAPLASTY Bilateral   ? breast implants  ? Bone Spur  2007  ? foot  ? CARDIAC CATHETERIZATION    ? Carpal Tunnel Symdrome  2008  ? as repeated in 2009  ? CATARACT  EXTRACTION W/PHACO Left 01/16/2019  ? Procedure: CATARACT EXTRACTION PHACO AND INTRAOCULAR LENS PLACEMENT ([place]) LEFT DIABETES VISION BLUE;  Surgeon: Marchia Meiers, MD;  Location: [facility] ORS;  Service: Ophthalmology;  Laterality: Left;  [place]  00:47 ?CDE 6.62 ?Fluid pack lot # [id number]  ? CATARACT EXTRACTION W/PHACO Right 02/06/2019  ? Procedure: CATARACT EXTRACTION PHACO AND INTRAOCULAR LENS PLACEMENT (IOC);  Surgeon: Marchia Meiers, MD;  Location: [facility] ORS;  Service: Ophthalmology;  Laterality: Right;  [place] 00:54.6 ?CDE 6.52 ?FLUID PACK LOT # [id number]  ? CHOLECYSTECTOMY  2007  ? COLONOSCOPY WITH PROPOFOL N/A 11/17/2015  ? Procedure: COLONOSCOPY WITH PROPOFOL;  Surgeon: Manya Silvas, MD;  Location: [facility] ENDOSCOPY;  Service: Endoscopy;  Laterality: N/A;  ? ESOPHAGOGASTRODUODENOSCOPY (EGD) WITH PROPOFOL N/A 11/17/2015  ? Procedure: ESOPHAGOGASTRODUODENOSCOPY (EGD) WITH PROPOFOL;  Surgeon: Manya Silvas, MD;  Location: [facility] ENDOSCOPY;  Service: Endoscopy;  Laterality: N/A;  ? EYE SURGERY Bilateral   ? cataract extraction  ? FOOT SURGERY    ? INTRAVASCULAR PRESSURE WIRE/FFR STUDY N/A 05/24/2021  ? Procedure: INTRAVASCULAR PRESSURE WIRE/FFR STUDY;  Surgeon: Yolonda Kida, MD;  Location: [place] [facility];  Service: Cardiovascular;  Laterality: N/A;  ? MANDIBLE SURGERY    ? NASAL SINUS  SURGERY  03/2013  ? Multile surgery Dr. Carlis Abbott. DX eosinophilic sinusitis [id number]  ? Release of Trigger Finger Right 10/2010  ? Dr. Tamala Julian  ? RIGHT/LEFT HEART CATH AND CORONARY ANGIOGRAPHY N/A 05/24/2021  ? Procedure: RIGHT/LEFT HEART CATH AND CORONARY ANGIOGRAPHY;  Surgeon: Yolonda Kida, MD;  Location: [place] [facility];  Service: Cardiovascular;  Laterality: N/A;  ? TONSILLECTOMY  1960  ? ?Patient Active Problem List  ? Diagnosis Date Noted  ? Cervical myelopathy ([place]) 11/15/2020  ? LVH (left ventricular hypertrophy) 10/14/2020  ? COVID-19 05/19/2020  ? MRSA cellulitis 04/19/2018  ? Decreased libido 02/04/2018  ? Diabetes mellitus  with neuropathy ([place]) 04/20/2017  ? H/O adenomatous polyp of colon 08/20/2015  ? Rupture of implant of right breast 05/18/2015  ? Anxiety disorder 04/26/2015  ? Allergic rhinitis 04/21/2015  ? Anemia 04/21/2015  ? Asthma 04/21/2015  ? 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/21/2015  ? Chronic infection of sinus 04/21/2015  ? Depression 04/21/2015  ? GERD (gastroesophageal reflux disease) 04/21/2015  ? Headache 04/21/2015  ? Menopausal symptoms 04/21/2015  ? Obesity 04/21/2015  ? Palpitations 04/21/2015  ? Vitamin B12 deficiency 04/21/2015  ? Aortic stenosis 01/21/2013  ? Diastolic dysfunction 26/71/2458  ? Diabetes mellitus out of control 06/12/2006  ? Barrett esophagus 06/13/2003  ? Essential hypertension 06/12/1998  ? Genital herpes 06/12/1998  ? Hyperlipidemia, mixed 06/12/1998  ? ? ?REFERRING DIAG: imbalance ? ?THERAPY DIAG:  ?Unsteadiness on feet ? ?Dizziness and giddiness ? ?History of falling ? ?Difficulty in walking, not elsewhere classified ? ?PERTINENT HISTORY: Patient is a 70 y.o. female who presents to outpatient physical therapy with a referral for medical diagnosis imbalance. This patient's chief complaints consist of unsteadiness on feet, feeling of heaviness in eyes, history of falling, double vision, feeling clouded cognitively leading to the following functional deficits: she doesn't feel clear-headed, she has become very self-conscious that when she looks at people, she is reluctant to make eye contact (not like her), she is having facial tics that bothers her, she feels she is looking funny when people look at her. She thinks because of this fogginess in her head it makes her balance worse. Hard to do things period because she is always feeling she is unsteady. 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 Patient denies history of stroke, seizure, lung problems, bowel or bladder changes, groin numbness or tingling. Patient denies hx of cancer, stroke, seizures, lung problem, major cardiac events, diabetes, unexplained weight loss, changes in bowel or bladder problems, new onset stumbling or dropping things apart from described below. ? ?PRECAUTIONS: falls ? ?SUBJECTIVE: Patient reports she feels pretty good today. She states yesterday her shoulders and neck were hurting that she attributes to stress but today she is feeling better. She did not do her HEP since her last session. She continues to have trouble finding time for herself due to complex living situation.  ? ?PAIN:  ?Are you having pain? no ? ? ?TODAY'S TREATMENT:  ?Neuromuscular Re-education: to improve, balance, postural strength, muscle activation patterns, and stabilization strength required for functional activities: ?  ?  SHOES DOFFED ?  ?- tandem stance balance on firm surface, 2x1 min each foot front, B UE support as needed.  ?- side stepping along 2x4 board concentrating on feeling where feet are on the board, 1x10 feet each direction on forefeet and 1x10 with rearfeet on board, B UE support ?- forwards/backwards walking along 2x4 board concentrating on feeling where feet are on the board, 3x10 feet each direction U UE support ?- [place], 2x20 feet each direction with CGA ?- standing foot taps on 8.5 inch  step while standing on airex pad progressing to not holding on or looking at feet,  3x20 each side, SBA ?  ?Therapeutic exercise: to centralize symptoms and improve ROM, strength, muscular endurance, and  activity tolerance required for successful completion of functional activities.  ?- B gastroc stretch on 20 degree slant board with B UE support, 3x30 seconds  ?- standing B ankle PF/DF on rocker board with B 1 f

## 2021-09-07 ENCOUNTER — Institutional Professional Consult (permissible substitution): Payer: PPO | Admitting: Surgery

## 2021-09-07 ENCOUNTER — Encounter: Payer: Self-pay | Admitting: *Deleted

## 2021-09-07 ENCOUNTER — Other Ambulatory Visit: Payer: Self-pay | Admitting: *Deleted

## 2021-09-07 VITALS — BP 181/67 | HR 74 | Ht 60.0 in

## 2021-09-07 DIAGNOSIS — I350 Nonrheumatic aortic (valve) stenosis: Secondary | ICD-10-CM

## 2021-09-07 DIAGNOSIS — I2510 Atherosclerotic heart disease of native coronary artery without angina pectoris: Secondary | ICD-10-CM | POA: Diagnosis not present

## 2021-09-07 NOTE — Progress Notes (Signed)
Pre Surgical Assessment: 5 M Walk Test ? ?60M=16.97f ? ?5 Meter Walk Test- trial 1: 6.37 seconds ?5 Meter Walk Test- trial 2: 6.84 seconds ?5 Meter Walk Test- trial 3: 7.69 seconds ?5 Meter Walk Test Average: 6.9 seconds ?  ?

## 2021-09-07 NOTE — Progress Notes (Signed)
Patient ID: Katie Buckley, female   DOB: 1951/01/02, 71 y.o.   MRN: [id number] ? ?[facility]   ?MULTIDISCIPLINARY HEART VALVE CLINIC ?  ? ? ?   ?[address] ?      [place] [address] ?            [phone]   ?      ? ?CARDIOTHORACIC SURGERY CONSULTATION REPORT ? ?PCP is Fisher, Kirstie Peri, MD ?Referring Provider is Lujean Amel, MD ?Primary Cardiologist is Lujean Amel, MD ? ?Reason for consultation:  Severe aortic stenosis ? ?HPI: ? ?This patient is a 71 year old woman with a history of hypertension, type 2 diabetes, and aortic stenosis who was referred by Dr. Clayborn Bigness for treatment of severe aortic stenosis.  She has a long history of heart murmur.  An echo in May 2022 showed a mean gradient across aortic valve of 18.7 mmHg with a peak gradient of 33 mmHg.  Valve area was 0.91 cm?Marland Kitchen  A follow-up echocardiogram in November 2022 showed the same mean gradient of 18 mmHg with a peak gradient of 36 mmHg and a valve area of 0.79 cm? with moderate aortic insufficiency.  He reports progressive exertional fatigue and shortness of breath as well as episodes of dizziness with several falls.  She denies syncope.  She has had some lower extremity edema.  She denies any chest pain or pressure.  She underwent cardiac catheterization on 05/24/2021 showing severe aortic stenosis with a valve area 0.72 cm?.  Left ventricular ejection fraction was 60% with 2+ aortic insufficiency.  There is mild pulmonary hypertension.  Coronary angiography showed a 70% mid LAD stenosis.  FFR of this lesion was positive at 0.71. ? ?She is here today with her husband and daughter. ? ?Past Medical History:  ?Diagnosis Date  ? Anxiety   ? Aortic stenosis, mild   ? a.) mean gradient 18.7 mmHg on 10/2020 TTE  ? Depression   ? Dizziness   ? patient states went to urgent care 05/19/2021 and no medications, then went to [facility] ER but left before being seen.  Did not receive any new medications.  ? Frequent sinus  infections   ? GERD (gastroesophageal reflux disease) 04/21/2015  ? H/O tension headache   ? Heart murmur   ? History of Barrett's esophagus   ? Hyperchloremia   ? Hypertension   ? MRSA (methicillin resistant staph aureus) culture positive 2018  ? history of, in hand  ? Neuropathy   ? Palpitations   ? Seasonal allergies   ? Skin cancer   ? 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 T2DM (type 2 diabetes mellitus) ([place])   ? ? ?Past Surgical History:  ?Procedure Laterality Date  ? ANTERIOR CERVICAL DECOMP/DISCECTOMY FUSION N/A 11/15/2020  ? Procedure: ANTERIOR CERVICAL DECOMPRESSION/DISCECTOMY FUSION 1 LEVEL C3/4;  Surgeon: Deetta Perla, MD;  Location: [facility] ORS;  Service: Neurosurgery;  Laterality: N/A;  ? AUGMENTATION MAMMAPLASTY Bilateral   ? breast implants  ? Bone Spur  2007  ? foot  ? CARDIAC CATHETERIZATION    ? Carpal Tunnel Symdrome  2008  ? as repeated in 2009  ? CATARACT EXTRACTION W/PHACO Left 01/16/2019  ? Procedure: CATARACT EXTRACTION PHACO AND INTRAOCULAR LENS PLACEMENT ([place]) LEFT DIABETES VISION BLUE;  Surgeon: Marchia Meiers, MD;  Location: [facility] ORS;  Service: Ophthalmology;  Laterality: Left;  [place]  00:47 ?CDE 6.62 ?Fluid pack lot # [id number]  ? CATARACT EXTRACTION W/PHACO Right 02/06/2019  ? Procedure:  CATARACT EXTRACTION PHACO AND INTRAOCULAR LENS PLACEMENT (IOC);  Surgeon: Marchia Meiers, MD;  Location: [facility] ORS;  Service: Ophthalmology;  Laterality: Right;  [place] 00:54.6 ?CDE 6.52 ?FLUID PACK LOT # [id number] h  ? CHOLECYSTECTOMY  2007  ? COLONOSCOPY WITH PROPOFOL N/A 11/17/2015  ? Procedure: COLONOSCOPY WITH PROPOFOL;  Surgeon: Manya Silvas, MD;  Location: [facility] ENDOSCOPY;  Service: Endoscopy;  Laterality: N/A;  ? ESOPHAGOGASTRODUODENOSCOPY (EGD) WITH PROPOFOL N/A 11/17/2015  ? Procedure: ESOPHAGOGASTRODUODENOSCOPY (EGD) WITH PROPOFOL;  Surgeon: Manya Silvas, MD;  Location: [facility] ENDOSCOPY;  Service: Endoscopy;  Laterality: N/A;  ? EYE SURGERY Bilateral   ? cataract extraction  ? FOOT SURGERY    ?  INTRAVASCULAR PRESSURE WIRE/FFR STUDY N/A 05/24/2021  ? Procedure: INTRAVASCULAR PRESSURE WIRE/FFR STUDY;  Surgeon: Yolonda Kida, MD;  Location: [place] [facility];  Service: Cardiovascular;  Laterality: N/A;  ? MANDIBLE SURGERY    ? NASAL SINUS SURGERY  03/2013  ? Multile surgery Dr. Carlis Abbott. DX eosinophilic sinusitis [id number]  ? Release of Trigger Finger Right 10/2010  ? Dr. Tamala Julian  ? RIGHT/LEFT HEART CATH AND CORONARY ANGIOGRAPHY N/A 05/24/2021  ? Procedure: RIGHT/LEFT HEART CATH AND CORONARY ANGIOGRAPHY;  Surgeon: Yolonda Kida, MD;  Location: [place] [facility];  Service: Cardiovascular;  Laterality: N/A;  ? TONSILLECTOMY  1960  ? ? ?Family History  ?Problem Relation Age of Onset  ? Diabetes Father   ? Lung cancer Mother   ? Breast cancer Maternal Aunt   ? Irritable bowel syndrome Sister   ? Colon cancer Brother   ? ? ?Social History  ? ?Socioeconomic History  ? Marital status: Married  ?  Spouse name: [name], husband  ? Number of children: Not on file  ? Years of education: Not on file  ? Highest education level: Not on file  ?Occupational History  ?  Comment: retired  ?Tobacco Use  ? Smoking status: Never  ? Smokeless tobacco: Never  ?Vaping Use  ? Vaping Use: Never used  ?Substance and Sexual Activity  ? Alcohol use: No  ? Drug use: No  ? Sexual activity: Not Currently  ?  Birth control/protection: Post-menopausal  ?Other Topics Concern  ? Not on file  ?Social History Narrative  ? Not on file  ? ?Social Determinants of Health  ? ?Financial Resource Strain: Not on file  ?Food Insecurity: Not on file  ?Transportation Needs: Not on file  ?Physical Activity: Not on file  ?Stress: Not on file  ?Social Connections: Not on file  ?Intimate Partner Violence: Not on file  ? ? ?Prior to Admission medications   ?Medication Sig Start Date End Date Taking? Authorizing Provider  ?buPROPion (WELLBUTRIN SR) 100 MG 12 hr tablet Take 100 mg by mouth 2 (two) times daily. 05/25/20  Yes [provider]   ?butalbital-acetaminophen-caffeine (FIORICET) 50-325-40 MG tablet TAKE 1 TABLET BY MOUTH EVERY 6 HOURS AS NEEDED FOR HEADACHE 05/23/21  Yes [name], MD  ?Esomeprazole Magnesium 20 MG TBEC Take 20 mg by mouth daily before breakfast.   Yes [provider]  ?ezetimibe (ZETIA) 10 MG tablet TAKE 1 TABLET BY MOUTH EVERYDAY AT BEDTIME ?Patient taking differently: Take 10 mg by mouth at bedtime. 07/07/20  Yes [name], MD  ?fexofenadine (ALLEGRA) 180 MG tablet Take 180 mg by mouth daily as needed for allergies or rhinitis.   Yes [provider]  ?fluconazole (DIFLUCAN) 150 MG tablet Take one tablet on day you receive medication Take second tablet three days later  Take third and last tablet three days later 01/26/21  Yes Tally Joe T, FNP  ?gabapentin (NEURONTIN) 300 MG capsule TAKE 2 CAPSULES (600 MG TOTAL) BY MOUTH 2 (TWO) TIMES DAILY. MORNING & SUPPER 03/02/21  Yes [name], MD  ?glipiZIDE (GLUCOTROL) 10 MG tablet TAKE 1 TABLET BY MOUTH EVERY DAY BEFORE BREAKFAST 05/30/21  Yes [name], MD  ?hydrOXYzine (VISTARIL) 25 MG capsule Take 25 mg by mouth daily as needed for anxiety. 10/05/20  Yes [provider]  ?irbesartan (AVAPRO) 75 MG tablet TAKE 1 TABLET BY MOUTH EVERY DAY 12/17/20  Yes [name], MD  ?JARDIANCE 25 MG TABS tablet TAKE 1 TABLET (25 MG TOTAL) BY MOUTH DAILY. 07/18/21  Yes [name], MD  ?Lancet Devices MISC    Yes [provider]  ?metFORMIN (GLUCOPHAGE-XR) 500 MG 24 hr tablet Take 2 tablets (1,000 mg total) by mouth daily. 10/27/20  Yes [name], MD  ?methocarbamol (ROBAXIN) 500 MG tablet Take 1 tablet (500 mg total) by mouth every 6 (six) hours as needed for muscle spasms. 11/16/20  Yes Deetta Perla, MD  ?metoprolol-hydrochlorothiazide (LOPRESSOR HCT) 100-25 MG tablet Take 0.5 tablets by mouth daily. 10/14/20  Yes [name], MD  ?montelukast (SINGULAIR) 10 MG tablet TAKE 1 TABLET BY MOUTH EVERY DAY 05/18/20  Yes [name], MD  ?Olopatadine HCl 0.7 % SOLN Apply 1 drop to eye daily as needed.   Yes [provider]  ?omeprazole (PRILOSEC) 20 MG capsule Take 20 mg by mouth daily.   Yes [provider]  ?ondansetron [facility]

## 2021-09-08 ENCOUNTER — Telehealth: Payer: Self-pay | Admitting: Physical Therapy

## 2021-09-08 ENCOUNTER — Ambulatory Visit: Payer: PPO | Admitting: Physical Therapy

## 2021-09-08 NOTE — Telephone Encounter (Signed)
Called patient to discuss how her open heart surgery (bipass and aortic valve replacement) will affect her PT. She feels she benefits from PT and would like to continue but did not find out from heart doctor how long she should wait to return to PT or if she will be completing cardiac rehab or for how long. Advised patient she would need a new PT order after undergoing hospitalization and surgery and PT could resume once MD clears her. She planned to call her heart doctor back and to find out when they expected she may be able to return to PT after surgery and what expectations she would have regarding cardiac rehab. Patient is to call PT office back with this information to help determine appropriate scheduling for PT.  ? ?Everlean Alstrom. Graylon Good, PT, DPT ?09/08/21, 11:03 AM ? ?[facility] ?[address] ?[place], [place] [address] ?P: [phone] I F: [phone] ? ?

## 2021-09-10 ENCOUNTER — Other Ambulatory Visit: Payer: Self-pay | Admitting: Family Medicine

## 2021-09-12 ENCOUNTER — Telehealth: Payer: Self-pay | Admitting: Family Medicine

## 2021-09-12 ENCOUNTER — Other Ambulatory Visit: Payer: Self-pay | Admitting: Family Medicine

## 2021-09-12 NOTE — Telephone Encounter (Signed)
Please advise if you can work patient in to your schedule this week.  ?

## 2021-09-12 NOTE — Telephone Encounter (Signed)
Appointment scheduled for tomorrow 09/13/2021 at 1:20pm. I asked patient if her surgeon needs Dr. Caryn Section to complete a surgical clearance. Patient replied "No. I just wants to talk to Dr. Caryn Section about the upcoming surgery".  ?

## 2021-09-12 NOTE — Telephone Encounter (Signed)
Copied from [place] [id number]. Topic: Quick Communication - Rx Refill/Question ?>> Sep 12, 2021  3:41 PM Leward Quan A wrote: ?Medication: tirzepatide Darcel Bayley) 5 MG/0.5ML Pen ? ?Has the patient contacted their pharmacy? No. Was given samples a while ago  ?(Agent: If no, request that the patient contact the pharmacy for the refill. If patient does not wish to contact the pharmacy document the reason why and proceed with request.) ?(Agent: If yes, when and what did the pharmacy advise?) ? ?Preferred Pharmacy (with phone number or street name): [id number] [address] ?Phone:  [phone]?Fax:  [phone]? ? ? ?Has the patient been seen for an appointment in the last year OR does the patient have an upcoming appointment? No. ? ?Agent: Please be advised that RX refills may take up to 3 business days. We ask that you follow-up with your pharmacy. ?

## 2021-09-12 NOTE — Telephone Encounter (Signed)
Copied from [phone]. Topic: General - Other ?>> Sep 12, 2021  3:43 PM Leward Quan A wrote: ?Reason for CRM: Patient requesting a call back from Dr Caryn Section needing to be seen sometime this week getting ready for open heart surgery on Monday 09/19/21 asking if she can be fitted in the schedule to touch base before her surgery please can be reached at Ph# [phone] ?

## 2021-09-12 NOTE — Telephone Encounter (Signed)
She can have a sameday appt.  ?

## 2021-09-13 ENCOUNTER — Ambulatory Visit: Payer: PPO | Admitting: Family Medicine

## 2021-09-13 NOTE — Telephone Encounter (Signed)
Requested medication (s) are due for refill today: yes ? ?Requested medication (s) are on the active medication list: yes ? ?Last refill:  04/26/21 #54m/0 ? ?Future visit scheduled: no ? ?Notes to clinic:  Unable to refill per protocol, medication not assigned to the refill protocol. ? ? ?  ?Requested Prescriptions  ?Pending Prescriptions Disp Refills  ? tirzepatide (MOUNJARO) 5 MG/0.5ML Pen 2 mL 0  ?  Sig: Inject 5 mg into the skin once a week.  ?  ? There is no refill protocol information for this order  ?  ? ?

## 2021-09-14 ENCOUNTER — Ambulatory Visit: Payer: PPO | Admitting: Physical Therapy

## 2021-09-14 DIAGNOSIS — F331 Major depressive disorder, recurrent, moderate: Secondary | ICD-10-CM | POA: Diagnosis not present

## 2021-09-14 DIAGNOSIS — F411 Generalized anxiety disorder: Secondary | ICD-10-CM | POA: Diagnosis not present

## 2021-09-14 MED ORDER — TIRZEPATIDE 5 MG/0.5ML ~~LOC~~ SOAJ: 5.0000 mg | SUBCUTANEOUS | 0 refills | Status: DC

## 2021-09-14 NOTE — Pre-Procedure Instructions (Signed)
Surgical Instructions ? ? ? Your procedure is scheduled on Monday 09/19/21. ? ? Report to [facility] Main Entrance "A" at 05:30 A.M., then check in with the Admitting office. ? Call this number if you have problems the morning of surgery: ? [phone] ? ? If you have any questions prior to your surgery date call [phone]: Open Monday-Friday 8am-4pm ? ? ? Remember: ? Do not eat or drink after midnight the night before your surgery ?  ? Take these medicines the morning of surgery with A SIP OF WATER:  ? buPROPion [facility] SR) ? Esomeprazole Magnesium  ? pregabalin (LYRICA) ? rosuvastatin (CRESTOR) ? sertraline (ZOLOFT)  ? ? ? Take these medicines if needed:  ? hydrOXYzine (VISTARIL)  ? methocarbamol (ROBAXIN)  ? Olopatadine ? ondansetron (ZOFRAN ODT) ? valACYclovir (VALTREX)  ? ?As of today, STOP taking any Aspirin (unless otherwise instructed by your surgeon) Aleve, Naproxen, Ibuprofen, Motrin, Advil, Goody's, BC's, all herbal medications, fish oil, and all vitamins. ? ?WHAT DO I DO ABOUT MY DIABETES MEDICATION? ? ? ?Do not take oral diabetes medicines (pills) the morning of surgery. ? ?DO NOT TAKE  glipiZIDE (GLUCOTROL) the morning of surgery.  ? ?DO NOT TAKE JARDIANCE the day before surgery or the morning of surgery.  ? ?DO NOT TAKE tirzepatide [facility]) the morning of surgery. ? ?The day of surgery, do not take other diabetes injectables, including Byetta (exenatide), Bydureon (exenatide ER), Victoza (liraglutide), or Trulicity (dulaglutide). ? ?If your CBG is greater than 220 mg/dL, you may take ? of your sliding scale (correction) dose of insulin. ? ? ?HOW TO MANAGE YOUR DIABETES ?BEFORE AND AFTER SURGERY ? ?Why is it important to control my blood sugar before and after surgery? ?Improving blood sugar levels before and after surgery helps healing and can limit problems. ?A way of improving blood sugar control is eating a healthy diet by: ? Eating less sugar and carbohydrates ? Increasing  activity/exercise ? Talking with your doctor about reaching your blood sugar goals ?High blood sugars (greater than 180 mg/dL) can raise your risk of infections and slow your recovery, so you will need to focus on controlling your diabetes during the weeks before surgery. ?Make sure that the doctor who takes care of your diabetes knows about your planned surgery including the date and location. ? ?How do I manage my blood sugar before surgery? ?Check your blood sugar at least 4 times a day, starting 2 days before surgery, to make sure that the level is not too high or low. ? ?Check your blood sugar the morning of your surgery when you wake up and every 2 hours until you get to the Short Stay unit. ? ?If your blood sugar is less than 70 mg/dL, you will need to treat for low blood sugar: ?Do not take insulin. ?Treat a low blood sugar (less than 70 mg/dL) with ? cup of clear juice (cranberry or apple), 4 glucose tablets, OR glucose gel. ?Recheck blood sugar in 15 minutes after treatment (to make sure it is greater than 70 mg/dL). If your blood sugar is not greater than 70 mg/dL on recheck, call [phone] for further instructions. ?Report your blood sugar to the short stay nurse when you get to Short Stay. ? ?If you are admitted to the hospital after surgery: ?Your blood sugar will be checked by the staff and you will probably be given insulin after surgery (instead of oral diabetes medicines) to make sure you have good blood sugar levels. ?The goal  for blood sugar control after surgery is 80-180 mg/dL.  ?         ?Do not wear jewelry or makeup ?Do not wear lotions, powders, perfumes/colognes, or deodorant. ?Do not shave 48 hours prior to surgery.  Men may shave face and neck. ?Do not bring valuables to the hospital. ?Do not wear nail polish, gel polish, artificial nails, or any other type of covering on natural nails (fingers and toes) ?If you have artificial nails or gel coating that need to be removed by a nail  salon, please have this removed prior to surgery. Artificial nails or gel coating may interfere with anesthesia's ability to adequately monitor your vital signs. ? ?[facility] is not responsible for any belongings or valuables. .  ? ?Do NOT Smoke (Tobacco/Vaping)  24 hours prior to your procedure ? ?If you use a CPAP at night, you may bring your mask for your overnight stay. ?  ?Contacts, glasses, hearing aids, dentures or partials may not be worn into surgery, please bring cases for these belongings ?  ?For patients admitted to the hospital, discharge time will be determined by your treatment team. ?  ?Patients discharged the day of surgery will not be allowed to drive home, and someone needs to stay with them for 24 hours. ? ? ?SURGICAL WAITING ROOM VISITATION ?Patients having surgery or a procedure in a hospital may have two support people. ?Children under the age of 103 must have an adult with them who is not the patient. ?They may stay in the waiting area during the procedure and may switch out with other visitors. If the patient needs to stay at the hospital during part of their recovery, the visitor guidelines for inpatient rooms apply. ? ?Please refer to the [facility] website for the visitor guidelines for Inpatients (after your surgery is over and you are in a regular room).  ? ? ? ? ? ?Special instructions:   ? ?Oral Hygiene is also important to reduce your risk of infection.  Remember - BRUSH YOUR TEETH THE MORNING OF SURGERY WITH YOUR REGULAR TOOTHPASTE ? ? ?[place]- Preparing For Surgery ? ?Before surgery, you can play an important role. Because skin is not sterile, your skin needs to be as free of germs as possible. You can reduce the number of germs on your skin by washing with CHG (chlorahexidine gluconate) Soap before surgery.  CHG is an antiseptic cleaner which kills germs and bonds with the skin to continue killing germs even after washing.   ? ? ?Please do not use if you have an allergy to  CHG or antibacterial soaps. If your skin becomes reddened/irritated stop using the CHG.  ?Do not shave (including legs and underarms) for at least 48 hours prior to first CHG shower. It is OK to shave your face. ? ?Please follow these instructions carefully. ?  ? ? Shower the NIGHT BEFORE SURGERY and the MORNING OF SURGERY with CHG Soap.  ? If you chose to wash your hair, wash your hair first as usual with your normal shampoo. After you shampoo, rinse your hair and body thoroughly to remove the shampoo.  Then [employer] and genitals (private parts) with your normal soap and rinse thoroughly to remove soap. ? ?After that Use CHG Soap as you would any other liquid soap. You can apply CHG directly to the skin and wash gently with a scrungie or a clean washcloth.  ? ?Apply the CHG Soap to your body ONLY FROM THE NECK  DOWN.  Do not use on open wounds or open sores. Avoid contact with your eyes, ears, mouth and genitals (private parts). Wash Face and genitals (private parts)  with your normal soap.  ? ?Wash thoroughly, paying special attention to the area where your surgery will be performed. ? ?Thoroughly rinse your body with warm water from the neck down. ? ?DO NOT shower/wash with your normal soap after using and rinsing off the CHG Soap. ? ?Pat yourself dry with a CLEAN TOWEL. ? ?Wear CLEAN PAJAMAS to bed the night before surgery ? ?Place CLEAN SHEETS on your bed the night before your surgery ? ?DO NOT SLEEP WITH PETS. ? ? ?Day of Surgery: ? ?Take a shower with CHG soap. ?Wear Clean/Comfortable clothing the morning of surgery ?Do not apply any deodorants/lotions.   ?Remember to brush your teeth WITH YOUR REGULAR TOOTHPASTE. ? ? ? ?If you received a COVID test during your pre-op visit  it is requested that you wear a mask when out in public, stay away from anyone that may not be feeling well and notify your surgeon if you develop symptoms. If you have been in contact with anyone that has tested positive in the last 10  days please notify you surgeon. ? ?  ?Please read over the following fact sheets that you were given.  ? ?

## 2021-09-15 ENCOUNTER — Ambulatory Visit (HOSPITAL_COMMUNITY)
Admission: RE | Admit: 2021-09-15 | Discharge: 2021-09-15 | Disposition: A | Discharge status: Home / Self Care | Payer: PPO | Source: Ambulatory Visit | Attending: Surgery | Admitting: Surgery

## 2021-09-15 ENCOUNTER — Other Ambulatory Visit: Payer: Self-pay

## 2021-09-15 ENCOUNTER — Ambulatory Visit (HOSPITAL_COMMUNITY): Payer: PPO

## 2021-09-15 ENCOUNTER — Ambulatory Visit (HOSPITAL_BASED_OUTPATIENT_CLINIC_OR_DEPARTMENT_OTHER)
Admission: RE | Admit: 2021-09-15 | Discharge: 2021-09-15 | Disposition: A | Discharge status: Home / Self Care | Payer: PPO | Source: Ambulatory Visit | Attending: Surgery | Admitting: Surgery

## 2021-09-15 ENCOUNTER — Other Ambulatory Visit (HOSPITAL_COMMUNITY): Payer: PPO

## 2021-09-15 ENCOUNTER — Encounter (HOSPITAL_COMMUNITY): Payer: Self-pay

## 2021-09-15 ENCOUNTER — Encounter (HOSPITAL_COMMUNITY)
Admission: RE | Admit: 2021-09-15 | Discharge: 2021-09-15 | Disposition: A | Discharge status: Home / Self Care | Payer: PPO | Source: Ambulatory Visit | Attending: Surgery | Admitting: Surgery

## 2021-09-15 VITALS — BP 149/47 | HR 66 | Temp 99.2°F | Resp 18 | Ht 60.0 in | Wt 140.1 lb

## 2021-09-15 DIAGNOSIS — A4901 Methicillin susceptible Staphylococcus aureus infection, unspecified site: Secondary | ICD-10-CM | POA: Diagnosis not present

## 2021-09-15 DIAGNOSIS — M419 Scoliosis, unspecified: Secondary | ICD-10-CM | POA: Diagnosis not present

## 2021-09-15 DIAGNOSIS — I350 Nonrheumatic aortic (valve) stenosis: Secondary | ICD-10-CM

## 2021-09-15 DIAGNOSIS — I2510 Atherosclerotic heart disease of native coronary artery without angina pectoris: Secondary | ICD-10-CM | POA: Insufficient documentation

## 2021-09-15 DIAGNOSIS — K219 Gastro-esophageal reflux disease without esophagitis: Secondary | ICD-10-CM | POA: Diagnosis not present

## 2021-09-15 DIAGNOSIS — Z20822 Contact with and (suspected) exposure to covid-19: Secondary | ICD-10-CM | POA: Diagnosis not present

## 2021-09-15 DIAGNOSIS — J984 Other disorders of lung: Secondary | ICD-10-CM | POA: Diagnosis not present

## 2021-09-15 DIAGNOSIS — Z8719 Personal history of other diseases of the digestive system: Secondary | ICD-10-CM | POA: Insufficient documentation

## 2021-09-15 DIAGNOSIS — Z01818 Encounter for other preprocedural examination: Secondary | ICD-10-CM | POA: Diagnosis not present

## 2021-09-15 DIAGNOSIS — R921 Mammographic calcification found on diagnostic imaging of breast: Secondary | ICD-10-CM | POA: Diagnosis not present

## 2021-09-15 DIAGNOSIS — I272 Pulmonary hypertension, unspecified: Secondary | ICD-10-CM | POA: Diagnosis not present

## 2021-09-15 DIAGNOSIS — I10 Essential (primary) hypertension: Secondary | ICD-10-CM | POA: Diagnosis not present

## 2021-09-15 DIAGNOSIS — Z85828 Personal history of other malignant neoplasm of skin: Secondary | ICD-10-CM | POA: Insufficient documentation

## 2021-09-15 DIAGNOSIS — E1140 Type 2 diabetes mellitus with diabetic neuropathy, unspecified: Secondary | ICD-10-CM | POA: Diagnosis not present

## 2021-09-15 HISTORY — DX: Dyspnea, unspecified: R06.00

## 2021-09-15 LAB — CBC
HCT: 35.1 % — ABNORMAL LOW (ref 36.0–46.0)
Hemoglobin: 11.3 g/dL — ABNORMAL LOW (ref 12.0–15.0)
MCH: 24.9 pg — ABNORMAL LOW (ref 26.0–34.0)
MCHC: 32.2 g/dL (ref 30.0–36.0)
MCV: 77.3 fL — ABNORMAL LOW (ref 80.0–100.0)
Platelets: 145 10*3/uL — ABNORMAL LOW (ref 150–400)
RBC: 4.54 MIL/uL (ref 3.87–5.11)
RDW: 14.2 % (ref 11.5–15.5)
WBC: 7.7 10*3/uL (ref 4.0–10.5)
nRBC: 0 % (ref 0.0–0.2)

## 2021-09-15 LAB — COMPREHENSIVE METABOLIC PANEL
ALT: 19 U/L (ref 0–44)
AST: 24 U/L (ref 15–41)
Albumin: 4.2 g/dL (ref 3.5–5.0)
Alkaline Phosphatase: 79 U/L (ref 38–126)
Anion gap: 10 (ref 5–15)
BUN: 12 mg/dL (ref 8–23)
CO2: 25 mmol/L (ref 22–32)
Calcium: 9.6 mg/dL (ref 8.9–10.3)
Chloride: 104 mmol/L (ref 98–111)
Creatinine, Ser: 0.93 mg/dL (ref 0.44–1.00)
GFR, Estimated: >60 mL/min (ref >60)
Glucose, Bld: 259 mg/dL — ABNORMAL HIGH (ref 70–99)
Potassium: 4.3 mmol/L (ref 3.5–5.1)
Sodium: 139 mmol/L (ref 135–145)
Total Bilirubin: 0.2 mg/dL — ABNORMAL LOW (ref 0.3–1.2)
Total Protein: 6.7 g/dL (ref 6.5–8.1)

## 2021-09-15 LAB — BLOOD GAS, ARTERIAL
Acid-Base Excess: 7.7 mmol/L — ABNORMAL HIGH (ref 0.0–2.0)
Bicarbonate: 32.7 mmol/L — ABNORMAL HIGH (ref 20.0–28.0)
Drawn by: 60286
O2 Saturation: 98 %
Patient temperature: 37
pCO2 arterial: 46 mmHg (ref 32–48)
pH, Arterial: 7.46 — ABNORMAL HIGH (ref 7.35–7.45)
pO2, Arterial: 100 mmHg (ref 83–108)

## 2021-09-15 LAB — SURGICAL PCR SCREEN
MRSA, PCR: NEGATIVE
Staphylococcus aureus: POSITIVE — AB

## 2021-09-15 LAB — URINALYSIS, ROUTINE W REFLEX MICROSCOPIC
Bilirubin Urine: NEGATIVE
Glucose, UA: >=500 mg/dL — AB
Hgb urine dipstick: NEGATIVE
Ketones, ur: NEGATIVE mg/dL
Leukocytes,Ua: NEGATIVE
Nitrite: NEGATIVE
Protein, ur: NEGATIVE mg/dL
Specific Gravity, Urine: 1.026 (ref 1.005–1.030)
pH: 5 (ref 5.0–8.0)

## 2021-09-15 LAB — HEMOGLOBIN A1C
Hgb A1c MFr Bld: 8 % — ABNORMAL HIGH (ref 4.8–5.6)
Mean Plasma Glucose: 182.9 mg/dL

## 2021-09-15 LAB — GLUCOSE, CAPILLARY: Glucose-Capillary: 301 mg/dL — ABNORMAL HIGH (ref 70–99)

## 2021-09-15 LAB — SARS CORONAVIRUS 2 (TAT 6-24 HRS): SARS Coronavirus 2: NEGATIVE

## 2021-09-15 NOTE — Progress Notes (Signed)
PCP - Dr. Lelon Huh ?Cardiologist - Dr. Lujean Amel- Care Everywhere ? ?PPM/ICD - n/a ?Device Orders - n/a ?Rep Notified - n/a ? ?Chest x-ray - 09/15/21 ?EKG - 09/15/21 ?Stress Test - denies ?ECHO - 10/12/20 ?Cardiac Cath - 05/24/21 ? ?Sleep Study - denies ?CPAP - n/a ? ?CBG today- 301. Patient states she ate 3 [employer] Doughnuts and a glass of milk this morning. Patient states she took Metformin, Jardiance, and Glipizide around 930 this morning. Patient states she does not normally do this but is nervous about upcoming surgery.  ? ?Reviewed with patient the importance of controlling her blood sugar before and after surgery and maintaining a healthy diet. Patient verbalized understanding.  ? ?Checks Blood Sugar two to three times a week ?Range- 180-200 ?A1C- 9.2 03/25/21. A1C drawn with today's labs. Pending.  ? ?Blood Thinner Instructions: n/a ?Aspirin Instructions: n/a ? ?COVID TEST- 09/15/21. Pending.  ? ? ?Anesthesia review: Yes.  ? ?Patient denies shortness of breath, fever, cough and chest pain at PAT appointment ? ? ?All instructions explained to the patient, with a verbal understanding of the material. Patient agrees to go over the instructions while at home for a better understanding. Patient also instructed to self quarantine after being tested for COVID-19. The opportunity to ask questions was provided. ? ? ?

## 2021-09-15 NOTE — Progress Notes (Signed)
Received a call from the lab that PT and PTT are clotted and will have to be redrawn day of surgery.  ?

## 2021-09-15 NOTE — Progress Notes (Signed)
Levonne Spiller, RN with Dr. Cyndia Bent made aware patient's PT/PTT clotted and will have to be redrawn day of surgery and that PCR is staph positive.  ?

## 2021-09-15 NOTE — Progress Notes (Signed)
Levonne Spiller, RN with Dr. Cyndia Bent made aware of patient's abnormal urinalysis and ABG and CBG 300 today.  Lorretta Harp, PA and Sigmund Hazel, PA with anesthesia also made aware.  ?

## 2021-09-16 MED ORDER — TRANEXAMIC ACID (OHS) PUMP PRIME SOLUTION
2.0000 mg/kg | INTRAVENOUS | Status: DC
  Filled 2021-09-16: qty 1.27

## 2021-09-16 MED ORDER — HEPARIN 30,000 UNITS/1000 ML (OHS) CELLSAVER SOLUTION
Status: DC
  Filled 2021-09-16: qty 1000

## 2021-09-16 MED ORDER — MILRINONE LACTATE IN DEXTROSE 20-5 MG/100ML-% IV SOLN
0.3000 ug/kg/min | INTRAVENOUS | Status: DC
Start: 2021-09-19 — End: 2021-09-19
  Filled 2021-09-16: qty 100

## 2021-09-16 MED ORDER — POTASSIUM CHLORIDE 2 MEQ/ML IV SOLN
80.0000 meq | INTRAVENOUS | Status: DC
  Filled 2021-09-16: qty 40

## 2021-09-16 MED ORDER — NITROGLYCERIN IN D5W 200-5 MCG/ML-% IV SOLN
2.0000 ug/min | INTRAVENOUS | Status: AC
  Administered 2021-09-19: 5 ug/min via INTRAVENOUS
  Filled 2021-09-16: qty 250

## 2021-09-16 MED ORDER — TRANEXAMIC ACID 1000 MG/10ML IV SOLN
1.5000 mg/kg/h | INTRAVENOUS | Status: AC
  Administered 2021-09-19: 1.5 mg/kg/h via INTRAVENOUS
  Filled 2021-09-16: qty 25

## 2021-09-16 MED ORDER — CEFAZOLIN SODIUM-DEXTROSE 2-4 GM/100ML-% IV SOLN
2.0000 g | INTRAVENOUS | Status: DC
  Filled 2021-09-16: qty 100

## 2021-09-16 MED ORDER — NOREPINEPHRINE 4 MG/250ML-% IV SOLN
0.0000 ug/min | INTRAVENOUS | Status: DC
  Filled 2021-09-16: qty 250

## 2021-09-16 MED ORDER — PLASMA-LYTE A IV SOLN
INTRAVENOUS | Status: DC
  Filled 2021-09-16: qty 2.5

## 2021-09-16 MED ORDER — MAGNESIUM SULFATE 50 % IJ SOLN
Freq: Once | INTRAMUSCULAR | Status: DC
  Filled 2021-09-16: qty 13

## 2021-09-16 MED ORDER — VANCOMYCIN HCL 1250 MG/250ML IV SOLN
1250.0000 mg | INTRAVENOUS | Status: AC
  Administered 2021-09-19: 1250 mg via INTRAVENOUS
  Filled 2021-09-16: qty 250

## 2021-09-16 MED ORDER — EPINEPHRINE HCL 5 MG/250ML IV SOLN IN NS
0.0000 ug/min | INTRAVENOUS | Status: DC
  Filled 2021-09-16: qty 250

## 2021-09-16 MED ORDER — TRANEXAMIC ACID (OHS) BOLUS VIA INFUSION
15.0000 mg/kg | INTRAVENOUS | Status: AC
  Administered 2021-09-19: 952.5 mg via INTRAVENOUS
  Filled 2021-09-16: qty 953

## 2021-09-16 MED ORDER — CEFAZOLIN SODIUM-DEXTROSE 2-4 GM/100ML-% IV SOLN
2.0000 g | INTRAVENOUS | Status: AC
  Administered 2021-09-19 (×2): 2 g via INTRAVENOUS
  Filled 2021-09-16: qty 100

## 2021-09-16 MED ORDER — INSULIN REGULAR(HUMAN) IN NACL 100-0.9 UT/100ML-% IV SOLN
INTRAVENOUS | Status: AC
  Administered 2021-09-19: 1.7 [IU]/h via INTRAVENOUS
  Filled 2021-09-16: qty 100

## 2021-09-16 MED ORDER — DEXMEDETOMIDINE HCL IN NACL 400 MCG/100ML IV SOLN
0.1000 ug/kg/h | INTRAVENOUS | Status: AC
  Administered 2021-09-19: .3 ug/kg/h via INTRAVENOUS
  Filled 2021-09-16: qty 100

## 2021-09-16 MED ORDER — PHENYLEPHRINE HCL-NACL 20-0.9 MG/250ML-% IV SOLN
30.0000 ug/min | INTRAVENOUS | Status: DC
  Filled 2021-09-16: qty 250

## 2021-09-16 MED ORDER — MAGNESIUM SULFATE 50 % IJ SOLN
40.0000 meq | INTRAMUSCULAR | Status: DC
  Filled 2021-09-16: qty 9.85

## 2021-09-16 NOTE — Anesthesia Preprocedure Evaluation (Addendum)
Anesthesia Evaluation  ?Patient identified by MRN, date of birth, ID band ?Patient awake ? ? ? ?Reviewed: ?Allergy & Precautions, NPO status , Patient's Chart, lab work & pertinent test results, reviewed documented beta blocker date and time  ? ?History of Anesthesia Complications ?Negative for: history of anesthetic complications ? ?Airway ?Mallampati: II ? ?TM Distance: >3 FB ?Neck ROM: Full ? ? ? Dental ? ?(+) Dental Advisory Given ?  ?Pulmonary ?asthma , COPD,  ?  ?breath sounds clear to auscultation ? ? ? ? ? ? Cardiovascular ?hypertension, Pt. on medications and Pt. on home beta blockers ?(-) angina+ Valvular Problems/Murmurs (AS)  ?Rhythm:Regular Rate:Normal ? ?05/2021 Cath: 70% LAD, severe AS, mild AI, normal LVF with EF 60% ?  ?Neuro/Psych ? Headaches, Anxiety Depression   ? GI/Hepatic ?Neg liver ROS, GERD  Medicated and Controlled,  ?Endo/Other  ?diabetes (glu 184), Oral Hypoglycemic Agents ? Renal/GU ?negative Renal ROS  ? ?  ?Musculoskeletal ? ? Abdominal ?  ?Peds ? Hematology ?Hb 11.3, plt 145k   ?Anesthesia Other Findings ? ? Reproductive/Obstetrics ? ?  ? ? ? ? ? ? ? ? ? ? ? ? ? ?  ?  ? ? ? ? ? ? ?Anesthesia Physical ?Anesthesia Plan ? ?ASA: 4 ? ?Anesthesia Plan: General  ? ?Post-op Pain Management:   ? ?Induction: Intravenous ? ?PONV Risk Score and Plan: 3 and Treatment may vary due to age or medical condition ? ?Airway Management Planned: Oral ETT ? ?Additional Equipment: Arterial line, PA Cath, TEE and Ultrasound Guidance Line Placement ? ?Intra-op Plan:  ? ?Post-operative Plan: Post-operative intubation/ventilation ? ?Informed Consent: I have reviewed the patients History and Physical, chart, labs and discussed the procedure including the risks, benefits and alternatives for the proposed anesthesia with the patient or authorized representative who has indicated his/her understanding and acceptance.  ? ? ? ?Dental advisory given ? ?Plan Discussed with: CRNA and  Surgeon ? ?Anesthesia Plan Comments: (PAT note written 09/16/2021 by Myra Gianotti, PA-C. ?)  ? ? ? ? ?Anesthesia Quick Evaluation ? ?

## 2021-09-16 NOTE — Progress Notes (Signed)
Anesthesia Chart Review: ? Case: [id number] Date/Time: 09/19/21 0715  ? Procedures:  ?    CORONARY ARTERY BYPASS GRAFTING (CABG) (Chest) ?    AORTIC VALVE REPLACEMENT (AVR) (Chest) ?    TRANSESOPHAGEAL ECHOCARDIOGRAM (TEE)  ? Anesthesia type: General  ? Pre-op diagnosis:  ?    SEVERE AS ?    CAD  ? Location: [facility] ROOM 14 / [facility]  ? Surgeons: Gaye Pollack, MD  ? ?  ? ? ?DISCUSSION: Patient is a 71 year old female scheduled for the above procedure. ? ?History includes never smoker, HTN, DM2, neuropathy, dyspnea, murmur/aortic stenosis, CAD, palpitations, GERD, Barrett's esophagus, dizziness, skin cancer (BCC, SCC), spinal surgery (C3-4 ACDF 11/15/20). ? ?09/15/2021 presurgical COVID-19 test negative.  PT/PTT specimen clotted so will we have redrawn on the day of surgery. ? ? ?VS: BP (!) 149/47   Pulse 66   Temp 37.3 ?C (Oral)   Resp 18   Ht 5' (1.524 m)   Wt 63.5 kg   SpO2 98%   BMI 27.36 kg/m?  ? ? ?PROVIDERS: ?[name], MD is PCP  ?Lujean Amel, MD is cardiologist ? ? ?LABS: Preoperative labs noted.  Nonfasting glucose 259, but A1c down to 8.0% from 9.2% in October. ?(all labs ordered are listed, but only abnormal results are displayed) ? ?Labs Reviewed  ?SURGICAL PCR SCREEN - Abnormal; Notable for the following components:  ?    Result Value  ? Staphylococcus aureus POSITIVE (*)   ? All other components within normal limits  ?GLUCOSE, CAPILLARY - Abnormal; Notable for the following components:  ? Glucose-Capillary 301 (*)   ? All other components within normal limits  ?CBC - Abnormal; Notable for the following components:  ? Hemoglobin 11.3 (*)   ? HCT 35.1 (*)   ? MCV 77.3 (*)   ? MCH 24.9 (*)   ? Platelets 145 (*)   ? All other components within normal limits  ?COMPREHENSIVE METABOLIC PANEL - Abnormal; Notable for the following components:  ? Glucose, Bld 259 (*)   ? Total Bilirubin 0.2 (*)   ? All other components within normal limits  ?URINALYSIS, ROUTINE W REFLEX MICROSCOPIC - Abnormal; Notable  for the following components:  ? Color, Urine STRAW (*)   ? Glucose, UA >=500 (*)   ? Bacteria, UA RARE (*)   ? All other components within normal limits  ?BLOOD GAS, ARTERIAL - Abnormal; Notable for the following components:  ? pH, Arterial 7.46 (*)   ? Bicarbonate 32.7 (*)   ? Acid-Base Excess 7.7 (*)   ? All other components within normal limits  ?HEMOGLOBIN A1C - Abnormal; Notable for the following components:  ? Hgb A1c MFr Bld 8.0 (*)   ? All other components within normal limits  ?SARS CORONAVIRUS 2 (TAT 6-24 HRS)  ?TYPE AND SCREEN  ? ? ? ?IMAGES: ?CXR 09/15/21: In process.  ? ? ?EKG: 09/15/21: ?Sinus rhythm ?Left ventricular hypertrophy with repolarization abnormality ( R in aVL , Cornell product ) ?Abnormal ECG ?When compared with ECG of 24-May-2021 15:06, ?QRS duration has decreased ?Confirmed by Rudean Haskell [phone]) on 09/15/2021 3:05:59 PM ? ? ?CV: ?US Carotid 09/15/21: ?Summary:  ?- Right Carotid: Velocities in the right ICA are consistent with a 1-39%  ?stenosis.  ?- Left Carotid: The extracranial vessels were near-normal with only minimal  ?wall  ?              thickening or plaque.  ?- Vertebrals:  Bilateral vertebral arteries  demonstrate antegrade flow.  ?- Subclavians: Normal flow hemodynamics were seen in bilateral subclavian  ?             arteries.  ? ? ?RHC/LHC 05/24/21: ?  Mid LAD to Dist LAD lesion is 70% stenosed. ?  Hemodynamic findings consistent with mild pulmonary hypertension. ?  There is severe aortic valve stenosis. There is mild (2+) aortic regurgitation. ?  Normal left ventricular function at 60% ?  ?Conclusion ?Right heart pressures mildly elevated ?Mild pulmonary hypertension ?Wedge mean of 15 PA mean of 26 ?Normal left ventricular function of 60% ?Severe aortic valve stenosis at 0.72 cm? ?  ?Coronaries ?Left main Short free of disease ?LAD large mid to distal 70% lesion ?Circumflex large free of disease ?RCA no significant disease ?  ?FFR of mid LAD was positive at  0.71 ?Intervention deferred ?Patient to be referred to tertiary care center for evaluation of aortic valve and coronary disease ?TAVR versus SAVR ?Minx deployed to right femoral artery ?No complications ? ? ?Echo 04/25/21 ([facility]): ?INTERPRETATION  ?NORMAL LEFT VENTRICULAR SYSTOLIC FUNCTION   WITH MILD LVH  ?NORMAL RIGHT VENTRICULAR SYSTOLIC FUNCTION  ?AVA(VTI)= .79cm^2  ?MVA (VTI)= 1.19cm^2  ?MODERATE AR  ?TRIVIAL TR  ?TRIVIAL MS  ?MILD AS  ?MILD MR  ?EF >55%  ? ? ?ZioXT monitor 02/09/21-02/22/21: ?Patient had a minimum heart rate of 51 bpm, maximum heart rate of 146 bpm, and average heart rate of 71 bpm.  Predominant underlying rhythm was sinus rhythm.  Intermittent bundle branch block was present.  5 supraventricular tachycardia runs occurred, the run with the fastest interval lasting 6 beats with a max rate of 146 bpm, longest lasting 8 beats with an average rate of 122 bpm.  Isolated SVE's were rare (<1.0%), SVE couplets were rare (<1.0%), and SVE triplets were rare (<1.0%).  Isolated VE's were rare( <1.0%), and no VE couplets or VE triplets were present.  Ventricular trigeminy was present. ? ? ?Past Medical History:  ?Diagnosis Date  ? Anxiety   ? Aortic stenosis, mild   ? a.) mean gradient 18.7 mmHg on 10/2020 TTE  ? Depression   ? Dizziness   ? patient states went to urgent care 05/19/2021 and no medications, then went to [facility] ER but left before being seen.  Did not receive any new medications.  ? Dyspnea   ? Frequent sinus infections   ? GERD (gastroesophageal reflux disease) 04/21/2015  ? H/O tension headache   ? Headache   ? Tension headaches  ? Heart murmur   ? History of Barrett's esophagus   ? Hyperchloremia   ? Hypertension   ? MRSA (methicillin resistant staph aureus) culture positive 2018  ? history of, in hand  ? Neuropathy   ? Palpitations   ? Seasonal allergies   ? Skin cancer   ? 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 T2DM (type 2 diabetes mellitus) ([place])   ? ? ?Past Surgical History:   ?Procedure Laterality Date  ? ANTERIOR CERVICAL DECOMP/DISCECTOMY FUSION N/A 11/15/2020  ? Procedure: ANTERIOR CERVICAL DECOMPRESSION/DISCECTOMY FUSION 1 LEVEL C3/4;  Surgeon: Deetta Perla, MD;  Location: [facility] ORS;  Service: Neurosurgery;  Laterality: N/A;  ? AUGMENTATION MAMMAPLASTY Bilateral   ? breast implants  ? Bone Spur  2007  ? foot  ? CARDIAC CATHETERIZATION    ? Carpal Tunnel Symdrome  2008  ? as repeated in 2009  ? CATARACT EXTRACTION W/PHACO Left 01/16/2019  ? Procedure: CATARACT EXTRACTION PHACO AND INTRAOCULAR  LENS PLACEMENT ([place]) LEFT DIABETES VISION BLUE;  Surgeon: Marchia Meiers, MD;  Location: [facility] ORS;  Service: Ophthalmology;  Laterality: Left;  [place]  00:47 ?CDE 6.62 ?Fluid pack lot # [id number]  ? CATARACT EXTRACTION W/PHACO Right 02/06/2019  ? Procedure: CATARACT EXTRACTION PHACO AND INTRAOCULAR LENS PLACEMENT (IOC);  Surgeon: Marchia Meiers, MD;  Location: [facility] ORS;  Service: Ophthalmology;  Laterality: Right;  [place] 00:54.6 ?CDE 6.52 ?FLUID PACK LOT # [id number]  ? CHOLECYSTECTOMY  2007  ? COLONOSCOPY WITH PROPOFOL N/A 11/17/2015  ? Procedure: COLONOSCOPY WITH PROPOFOL;  Surgeon: Manya Silvas, MD;  Location[facility] ENDOSCOPY;  Service: Endoscopy;  Laterality: N/A;  ? ESOPHAGOGASTRODUODENOSCOPY (EGD) WITH PROPOFOL N/A 11/17/2015  ? Procedure: ESOPHAGOGASTRODUODENOSCOPY (EGD) WITH PROPOFOL;  Surgeon: Manya Silvas, MD;  Location: [facility]COPY;  Service: Endoscopy;  Laterality: N/A;  ? EYE SURGERY Bilateral   ? cataract extraction  ? FOOT SURGERY    ? INTRAVASCULAR PRESSURE WIRE/FFR STUDY N/A 05/24/2021  ? Procedure: INTRAVASCULAR PRESSURE WIRE/FFR STUDY;  Surgeon: Yolonda Kida, MD;  Location: [facility] LAB;  Service: Cardiovascular;  Laterality: N/A;  ? MANDIBLE SURGERY    ? NASAL SINUS SURGERY  03/2013  ? Multile surgery Dr. Carlis Abbott. 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 Release of Trigger Finger Right 10/2010  ? Dr. Tamala Julian  ? RIGHT/LEFT HEART CATH AND CORONARY ANGIOGRAPHY N/A 05/24/2021  ?  Procedure: RIGHT/LEFT HEART CATH AND CORONARY ANGIOGRAPHY;  Surgeon: Yolonda Kida, MD;  Location: [facility];  Service: Cardiovascular;  Laterality: N/A;  ? TONSILLECTOMY  1960  ? ? ?MEDICATIONS: ? acetaminophen (TYLE

## 2021-09-18 NOTE — H&P (Signed)
? ?   ?[place].[address] ?      [place] [address] ?            [phone]   ? ?  ?Cardiothoracic Surgery Admission History and Physical ? ? ?PCP is Fisher, Kirstie Peri, MD ?Referring Provider is Lujean Amel, MD ?Primary Cardiologist is Lujean Amel, MD ?  ?Reason for admission:  Severe aortic stenosis ?  ?HPI: ?  ?This patient is a 71 year old woman with a history of hypertension, type 2 diabetes, and aortic stenosis who was referred by Dr. Clayborn Bigness for treatment of severe aortic stenosis.  She has a long history of heart murmur.  An echo in May 2022 showed a mean gradient across aortic valve of 18.7 mmHg with a peak gradient of 33 mmHg.  Valve area was 0.91 cm?Marland Kitchen  A follow-up echocardiogram in November 2022 showed the same mean gradient of 18 mmHg with a peak gradient of 36 mmHg and a valve area of 0.79 cm? with moderate aortic insufficiency.  He reports progressive exertional fatigue and shortness of breath as well as episodes of dizziness with several falls.  She denies syncope.  She has had some lower extremity edema.  She denies any chest pain or pressure.  She underwent cardiac catheterization on 05/24/2021 showing severe aortic stenosis with a valve area 0.72 cm?.  Left ventricular ejection fraction was 60% with 2+ aortic insufficiency.  There is mild pulmonary hypertension.  Coronary angiography showed a 70% mid LAD stenosis.  FFR of this lesion was positive at 0.71. ?  ? ?  ?    ?Past Medical History:  ?Diagnosis Date  ? Anxiety    ? Aortic stenosis, mild    ?  a.) mean gradient 18.7 mmHg on 10/2020 TTE  ? Depression    ? Dizziness    ?  patient states went to urgent care 05/19/2021 and no medications, then went to [facility] ER but left before being seen.  Did not receive any new medications.  ? Frequent sinus infections    ? GERD (gastroesophageal reflux disease) 04/21/2015  ? H/O tension headache    ? Heart murmur    ? History of Barrett's esophagus    ? Hyperchloremia    ? Hypertension    ?  MRSA (methicillin resistant staph aureus) culture positive 2018  ?  history of, in hand  ? Neuropathy    ? Palpitations    ? Seasonal allergies    ? Skin cancer    ?  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 T2DM (type 2 diabetes mellitus) ([place])    ?  ?  ?     ?Past Surgical History:  ?Procedure Laterality Date  ? ANTERIOR CERVICAL DECOMP/DISCECTOMY FUSION N/A 11/15/2020  ?  Procedure: ANTERIOR CERVICAL DECOMPRESSION/DISCECTOMY FUSION 1 LEVEL C3/4;  Surgeon: Deetta Perla, MD;  Location: [facility] ORS;  Service: Neurosurgery;  Laterality: N/A;  ? AUGMENTATION MAMMAPLASTY Bilateral    ?  breast implants  ? Bone Spur   2007  ?  foot  ? CARDIAC CATHETERIZATION      ? Carpal Tunnel Symdrome   2008  ?  as repeated in 2009  ? CATARACT EXTRACTION W/PHACO Left 01/16/2019  ?  Procedure: CATARACT EXTRACTION PHACO AND INTRAOCULAR LENS PLACEMENT ([place]) LEFT DIABETES VISION BLUE;  Surgeon: Marchia Meiers, MD;  Location: [facility] ORS;  Service: Ophthalmology;  Laterality: Left;  [place]  00:47 ?CDE 6.62 ?Fluid pack lot # [id number]  ? CATARACT EXTRACTION  W/PHACO Right 02/06/2019  ?  Procedure: CATARACT EXTRACTION PHACO AND INTRAOCULAR LENS PLACEMENT (IOC);  Surgeon: Marchia Meiers, MD;  Location: [facility] ORS;  Service: Ophthalmology;  Laterality: Right;  [place] 00:54.6 ?CDE 6.52 ?FLUID PACK LOT # [id number]  ? CHOLECYSTECTOMY   2007  ? COLONOSCOPY WITH PROPOFOL N/A 11/17/2015  ?  Procedure: COLONOSCOPY WITH PROPOFOL;  Surgeon: Manya Silvas, MD;  Location: [facility]COPY;  Service: Endoscopy;  Laterality: N/A;  ? ESOPHAGOGASTRODUODENOSCOPY (EGD) WITH PROPOFOL N/A 11/17/2015  ?  Procedure: ESOPHAGOGASTRODUODENOSCOPY (EGD) WITH PROPOFOL;  Surgeon: Manya Silvas, MD;  Location: [facility] ENDOSCOPY;  Service: Endoscopy;  Laterality: N/A;  ? EYE SURGERY Bilateral    ?  cataract extraction  ? FOOT SURGERY      ? INTRAVASCULAR PRESSURE WIRE/FFR STUDY N/A 05/24/2021  ?  Procedure: INTRAVASCULAR PRESSURE WIRE/FFR STUDY;  Surgeon: Yolonda Kida, MD;  Location: [place] CV LAB;  Service: Cardiovascular;  Laterality: N/A;  ? MANDIBLE SURGERY      ? NASAL SINUS SURGERY   03/2013  ?  Multile surgery Dr. Carlis Abbott. DX eosinophilic sinusitis [id number]  ? Release of Trigger Finger Right 10/2010  ?  Dr. Tamala Julian  ? RIGHT/LEFT HEART CATH AND CORONARY ANGIOGRAPHY N/A 05/24/2021  ?  Procedure: RIGHT/LEFT HEART CATH AND CORONARY ANGIOGRAPHY;  Surgeon: Yolonda Kida, MD;  Location: [place] [facility];  Service: Cardiovascular;  Laterality: N/A;  ? TONSILLECTOMY   1960  ?  ?  ?     ?Family History  ?Problem Relation Age of Onset  ? Diabetes Father    ? Lung cancer Mother    ? Breast cancer Maternal Aunt    ? Irritable bowel syndrome Sister    ? Colon cancer Brother    ?  ?  ?Social History  ?  ?     ?Socioeconomic History  ? Marital status: Married  ?    Spouse name: [name], husband  ? Number of children: Not on file  ? Years of education: Not on file  ? Highest education level: Not on file  ?Occupational History  ?    Comment: retired  ?Tobacco Use  ? Smoking status: Never  ? Smokeless tobacco: Never  ?Vaping Use  ? Vaping Use: Never used  ?Substance and Sexual Activity  ? Alcohol use: No  ? Drug use: No  ? Sexual activity: Not Currently  ?    Birth control/protection: Post-menopausal  ?Other Topics Concern  ? Not on file  ?Social History Narrative  ? Not on file  ?  ?Social Determinants of Health  ?  ?Financial Resource Strain: Not on file  ?Food Insecurity: Not on file  ?Transportation Needs: Not on file  ?Physical Activity: Not on file  ?Stress: Not on file  ?Social Connections: Not on file  ?Intimate Partner Violence: Not on file  ?  ?  ?       ?Prior to Admission medications   ?Medication Sig Start Date End Date Taking? Authorizing Provider  ?buPROPion (WELLBUTRIN SR) 100 MG 12 hr tablet Take 100 mg by mouth 2 (two) times daily. 05/25/20   Yes [provider]  ?butalbital-acetaminophen-caffeine (FIORICET) 50-325-40 MG tablet TAKE 1 TABLET BY MOUTH EVERY 6 HOURS AS NEEDED  FOR HEADACHE 05/23/21   Yes [name], MD  ?Esomeprazole Magnesium 20 MG TBEC Take 20 mg by mouth daily before breakfast.     Yes [provider]  ?ezetimibe (ZETIA) 10 MG tablet TAKE 1 TABLET BY MOUTH EVERYDAY  AT BEDTIME ?Patient taking differently: Take 10 mg by mouth at bedtime. 07/07/20   Yes [name], MD  ?fexofenadine (ALLEGRA) 180 MG tablet Take 180 mg by mouth daily as needed for allergies or rhinitis.     Yes [provider]  ?fluconazole (DIFLUCAN) 150 MG tablet Take one tablet on day you receive medication Take second tablet three days later Take third and last tablet three days later 01/26/21   Yes Tally Joe T, FNP  ?gabapentin (NEURONTIN) 300 MG capsule TAKE 2 CAPSULES (600 MG TOTAL) BY MOUTH 2 (TWO) TIMES DAILY. MORNING & SUPPER 03/02/21   Yes [name], MD  ?glipiZIDE (GLUCOTROL) 10 MG tablet TAKE 1 TABLET BY MOUTH EVERY DAY BEFORE BREAKFAST 05/30/21   Yes [name], MD  ?hydrOXYzine (VISTARIL) 25 MG capsule Take 25 mg by mouth daily as needed for anxiety. 10/05/20   Yes [provider]  ?irbesartan (AVAPRO) 75 MG tablet TAKE 1 TABLET BY MOUTH EVERY DAY 12/17/20   Yes [name], MD  ?JARDIANCE 25 MG TABS tablet TAKE 1 TABLET (25 MG TOTAL) BY MOUTH DAILY. 07/18/21   Yes [name], MD  ?Lancet Devices MISC       Yes [provider]  ?metFORMIN (GLUCOPHAGE-XR) 500 MG 24 hr tablet Take 2 tablets (1,000 mg total) by mouth daily. 10/27/20   Yes [name], MD  ?methocarbamol (ROBAXIN) 500 MG tablet Take 1 tablet (500 mg total) by mouth every 6 (six) hours as needed for muscle spasms. 11/16/20   Yes Deetta Perla, MD  ?metoprolol-hydrochlorothiazide (LOPRESSOR HCT) 100-25 MG tablet Take 0.5 tablets by mouth daily. 10/14/20   Yes [name], MD  ?montelukast (SINGULAIR) 10 MG tablet TAKE 1 TABLET BY MOUTH EVERY DAY 05/18/20   Yes [name], MD  ?Olopatadine HCl 0.7 % SOLN Apply 1 drop to eye daily as needed.     Yes  [provider]  ?omeprazole (PRILOSEC) 20 MG capsule Take 20 mg by mouth daily.     Yes [provider]  ?ondansetron (ZOFRAN ODT) 4 MG disintegrating tablet Take 1 tablet (4 mg total) by mouth

## 2021-09-19 ENCOUNTER — Inpatient Hospital Stay (HOSPITAL_COMMUNITY)
Admission: RE | Admit: 2021-09-19 | Discharge: 2021-09-24 | DRG: 220 | Disposition: A | Discharge status: Home / Self Care | Payer: PPO | Attending: Surgery | Admitting: Surgery

## 2021-09-19 ENCOUNTER — Inpatient Hospital Stay (HOSPITAL_COMMUNITY): Payer: PPO

## 2021-09-19 ENCOUNTER — Other Ambulatory Visit: Payer: Self-pay

## 2021-09-19 ENCOUNTER — Inpatient Hospital Stay (HOSPITAL_COMMUNITY)
Admission: RE | Disposition: A | Discharge status: Home / Self Care | Payer: Self-pay | Source: Home / Self Care | Attending: Surgery

## 2021-09-19 ENCOUNTER — Inpatient Hospital Stay (HOSPITAL_COMMUNITY): Payer: PPO | Admitting: Certified Registered Nurse Anesthetist

## 2021-09-19 ENCOUNTER — Encounter (HOSPITAL_COMMUNITY): Payer: Self-pay | Admitting: Surgery

## 2021-09-19 DIAGNOSIS — Z7984 Long term (current) use of oral hypoglycemic drugs: Secondary | ICD-10-CM

## 2021-09-19 DIAGNOSIS — F32A Depression, unspecified: Secondary | ICD-10-CM | POA: Diagnosis present

## 2021-09-19 DIAGNOSIS — K219 Gastro-esophageal reflux disease without esophagitis: Secondary | ICD-10-CM | POA: Diagnosis present

## 2021-09-19 DIAGNOSIS — Z833 Family history of diabetes mellitus: Secondary | ICD-10-CM

## 2021-09-19 DIAGNOSIS — J9811 Atelectasis: Secondary | ICD-10-CM | POA: Diagnosis not present

## 2021-09-19 DIAGNOSIS — Z4682 Encounter for fitting and adjustment of non-vascular catheter: Secondary | ICD-10-CM | POA: Diagnosis not present

## 2021-09-19 DIAGNOSIS — I352 Nonrheumatic aortic (valve) stenosis with insufficiency: Secondary | ICD-10-CM | POA: Diagnosis present

## 2021-09-19 DIAGNOSIS — I350 Nonrheumatic aortic (valve) stenosis: Secondary | ICD-10-CM | POA: Diagnosis not present

## 2021-09-19 DIAGNOSIS — I440 Atrioventricular block, first degree: Secondary | ICD-10-CM | POA: Diagnosis present

## 2021-09-19 DIAGNOSIS — Z9049 Acquired absence of other specified parts of digestive tract: Secondary | ICD-10-CM

## 2021-09-19 DIAGNOSIS — I25110 Atherosclerotic heart disease of native coronary artery with unstable angina pectoris: Principal | ICD-10-CM | POA: Diagnosis present

## 2021-09-19 DIAGNOSIS — Z952 Presence of prosthetic heart valve: Secondary | ICD-10-CM

## 2021-09-19 DIAGNOSIS — D696 Thrombocytopenia, unspecified: Secondary | ICD-10-CM | POA: Diagnosis not present

## 2021-09-19 DIAGNOSIS — E785 Hyperlipidemia, unspecified: Secondary | ICD-10-CM | POA: Diagnosis not present

## 2021-09-19 DIAGNOSIS — D62 Acute posthemorrhagic anemia: Secondary | ICD-10-CM | POA: Diagnosis not present

## 2021-09-19 DIAGNOSIS — Z801 Family history of malignant neoplasm of trachea, bronchus and lung: Secondary | ICD-10-CM

## 2021-09-19 DIAGNOSIS — F419 Anxiety disorder, unspecified: Secondary | ICD-10-CM | POA: Diagnosis not present

## 2021-09-19 DIAGNOSIS — Z85828 Personal history of other malignant neoplasm of skin: Secondary | ICD-10-CM | POA: Diagnosis not present

## 2021-09-19 DIAGNOSIS — R296 Repeated falls: Secondary | ICD-10-CM | POA: Diagnosis present

## 2021-09-19 DIAGNOSIS — Z800 Family history of malignant neoplasm of digestive organs: Secondary | ICD-10-CM | POA: Diagnosis not present

## 2021-09-19 DIAGNOSIS — I110 Hypertensive heart disease with heart failure: Secondary | ICD-10-CM | POA: Diagnosis present

## 2021-09-19 DIAGNOSIS — E119 Type 2 diabetes mellitus without complications: Secondary | ICD-10-CM | POA: Diagnosis not present

## 2021-09-19 DIAGNOSIS — I2510 Atherosclerotic heart disease of native coronary artery without angina pectoris: Secondary | ICD-10-CM

## 2021-09-19 DIAGNOSIS — I272 Pulmonary hypertension, unspecified: Secondary | ICD-10-CM | POA: Diagnosis present

## 2021-09-19 DIAGNOSIS — J90 Pleural effusion, not elsewhere classified: Secondary | ICD-10-CM | POA: Diagnosis not present

## 2021-09-19 DIAGNOSIS — I4891 Unspecified atrial fibrillation: Secondary | ICD-10-CM | POA: Diagnosis not present

## 2021-09-19 DIAGNOSIS — E8770 Fluid overload, unspecified: Secondary | ICD-10-CM | POA: Diagnosis not present

## 2021-09-19 DIAGNOSIS — I088 Other rheumatic multiple valve diseases: Secondary | ICD-10-CM | POA: Diagnosis not present

## 2021-09-19 DIAGNOSIS — E1140 Type 2 diabetes mellitus with diabetic neuropathy, unspecified: Secondary | ICD-10-CM | POA: Diagnosis present

## 2021-09-19 DIAGNOSIS — Z951 Presence of aortocoronary bypass graft: Principal | ICD-10-CM

## 2021-09-19 DIAGNOSIS — I10 Essential (primary) hypertension: Secondary | ICD-10-CM

## 2021-09-19 DIAGNOSIS — Z452 Encounter for adjustment and management of vascular access device: Secondary | ICD-10-CM | POA: Diagnosis not present

## 2021-09-19 DIAGNOSIS — Z803 Family history of malignant neoplasm of breast: Secondary | ICD-10-CM

## 2021-09-19 DIAGNOSIS — Z79899 Other long term (current) drug therapy: Secondary | ICD-10-CM

## 2021-09-19 DIAGNOSIS — I358 Other nonrheumatic aortic valve disorders: Secondary | ICD-10-CM | POA: Diagnosis not present

## 2021-09-19 DIAGNOSIS — I5032 Chronic diastolic (congestive) heart failure: Secondary | ICD-10-CM | POA: Diagnosis not present

## 2021-09-19 HISTORY — PX: TEE WITHOUT CARDIOVERSION: SHX5443

## 2021-09-19 HISTORY — PX: AORTIC VALVE REPLACEMENT: SHX41

## 2021-09-19 HISTORY — PX: CORONARY ARTERY BYPASS GRAFT: SHX141

## 2021-09-19 LAB — POCT I-STAT, CHEM 8
BUN: 12 mg/dL (ref 8–23)
BUN: 12 mg/dL (ref 8–23)
BUN: 10 mg/dL (ref 8–23)
BUN: 11 mg/dL (ref 8–23)
BUN: 10 mg/dL (ref 8–23)
BUN: 10 mg/dL (ref 8–23)
Calcium, Ion: 1.27 mmol/L (ref 1.15–1.40)
Calcium, Ion: 1.24 mmol/L (ref 1.15–1.40)
Calcium, Ion: 0.97 mmol/L — ABNORMAL LOW (ref 1.15–1.40)
Calcium, Ion: 1.03 mmol/L — ABNORMAL LOW (ref 1.15–1.40)
Calcium, Ion: 1 mmol/L — ABNORMAL LOW (ref 1.15–1.40)
Calcium, Ion: 1.29 mmol/L (ref 1.15–1.40)
Chloride: 102 mmol/L (ref 98–111)
Chloride: 101 mmol/L (ref 98–111)
Chloride: 99 mmol/L (ref 98–111)
Chloride: 99 mmol/L (ref 98–111)
Chloride: 100 mmol/L (ref 98–111)
Chloride: 100 mmol/L (ref 98–111)
Creatinine, Ser: 0.5 mg/dL (ref 0.44–1.00)
Creatinine, Ser: 0.6 mg/dL (ref 0.44–1.00)
Creatinine, Ser: 0.4 mg/dL — ABNORMAL LOW (ref 0.44–1.00)
Creatinine, Ser: 0.5 mg/dL (ref 0.44–1.00)
Creatinine, Ser: 0.4 mg/dL — ABNORMAL LOW (ref 0.44–1.00)
Creatinine, Ser: 0.4 mg/dL — ABNORMAL LOW (ref 0.44–1.00)
Glucose, Bld: 159 mg/dL — ABNORMAL HIGH (ref 70–99)
Glucose, Bld: 159 mg/dL — ABNORMAL HIGH (ref 70–99)
Glucose, Bld: 142 mg/dL — ABNORMAL HIGH (ref 70–99)
Glucose, Bld: 157 mg/dL — ABNORMAL HIGH (ref 70–99)
Glucose, Bld: 196 mg/dL — ABNORMAL HIGH (ref 70–99)
Glucose, Bld: 227 mg/dL — ABNORMAL HIGH (ref 70–99)
HCT: 28 % — ABNORMAL LOW (ref 36.0–46.0)
HCT: 23 % — ABNORMAL LOW (ref 36.0–46.0)
HCT: 21 % — ABNORMAL LOW (ref 36.0–46.0)
HCT: 24 % — ABNORMAL LOW (ref 36.0–46.0)
HCT: 25 % — ABNORMAL LOW (ref 36.0–46.0)
HCT: 26 % — ABNORMAL LOW (ref 36.0–46.0)
Hemoglobin: 9.5 g/dL — ABNORMAL LOW (ref 12.0–15.0)
Hemoglobin: 7.8 g/dL — ABNORMAL LOW (ref 12.0–15.0)
Hemoglobin: 7.1 g/dL — ABNORMAL LOW (ref 12.0–15.0)
Hemoglobin: 8.2 g/dL — ABNORMAL LOW (ref 12.0–15.0)
Hemoglobin: 8.5 g/dL — ABNORMAL LOW (ref 12.0–15.0)
Hemoglobin: 8.8 g/dL — ABNORMAL LOW (ref 12.0–15.0)
Potassium: 3.6 mmol/L (ref 3.5–5.1)
Potassium: 3.4 mmol/L — ABNORMAL LOW (ref 3.5–5.1)
Potassium: 4 mmol/L (ref 3.5–5.1)
Potassium: 4.1 mmol/L (ref 3.5–5.1)
Potassium: 3.4 mmol/L — ABNORMAL LOW (ref 3.5–5.1)
Potassium: 4.1 mmol/L (ref 3.5–5.1)
Sodium: 141 mmol/L (ref 135–145)
Sodium: 140 mmol/L (ref 135–145)
Sodium: 137 mmol/L (ref 135–145)
Sodium: 141 mmol/L (ref 135–145)
Sodium: 138 mmol/L (ref 135–145)
Sodium: 139 mmol/L (ref 135–145)
TCO2: 29 mmol/L (ref 22–32)
TCO2: 26 mmol/L (ref 22–32)
TCO2: 27 mmol/L (ref 22–32)
TCO2: 27 mmol/L (ref 22–32)
TCO2: 25 mmol/L (ref 22–32)
TCO2: 27 mmol/L (ref 22–32)

## 2021-09-19 LAB — POCT I-STAT 7, (LYTES, BLD GAS, ICA,H+H)
Acid-Base Excess: 3 mmol/L — ABNORMAL HIGH (ref 0.0–2.0)
Acid-Base Excess: 4 mmol/L — ABNORMAL HIGH (ref 0.0–2.0)
Acid-Base Excess: 2 mmol/L (ref 0.0–2.0)
Acid-base deficit: 2 mmol/L (ref 0.0–2.0)
Acid-base deficit: 2 mmol/L (ref 0.0–2.0)
Acid-base deficit: 2 mmol/L (ref 0.0–2.0)
Acid-base deficit: 3 mmol/L — ABNORMAL HIGH (ref 0.0–2.0)
Bicarbonate: 23.8 mmol/L (ref 20.0–28.0)
Bicarbonate: 25.1 mmol/L (ref 20.0–28.0)
Bicarbonate: 25.5 mmol/L (ref 20.0–28.0)
Bicarbonate: 25.6 mmol/L (ref 20.0–28.0)
Bicarbonate: 26.3 mmol/L (ref 20.0–28.0)
Bicarbonate: 28.7 mmol/L — ABNORMAL HIGH (ref 20.0–28.0)
Bicarbonate: 29.5 mmol/L — ABNORMAL HIGH (ref 20.0–28.0)
Calcium, Ion: 0.91 mmol/L — ABNORMAL LOW (ref 1.15–1.40)
Calcium, Ion: 1.19 mmol/L (ref 1.15–1.40)
Calcium, Ion: 1.2 mmol/L (ref 1.15–1.40)
Calcium, Ion: 1.21 mmol/L (ref 1.15–1.40)
Calcium, Ion: 1.24 mmol/L (ref 1.15–1.40)
Calcium, Ion: 1.27 mmol/L (ref 1.15–1.40)
Calcium, Ion: 1.31 mmol/L (ref 1.15–1.40)
HCT: 23 % — ABNORMAL LOW (ref 36.0–46.0)
HCT: 25 % — ABNORMAL LOW (ref 36.0–46.0)
HCT: 26 % — ABNORMAL LOW (ref 36.0–46.0)
HCT: 34 % — ABNORMAL LOW (ref 36.0–46.0)
HCT: 34 % — ABNORMAL LOW (ref 36.0–46.0)
HCT: 35 % — ABNORMAL LOW (ref 36.0–46.0)
HCT: 35 % — ABNORMAL LOW (ref 36.0–46.0)
Hemoglobin: 11.6 g/dL — ABNORMAL LOW (ref 12.0–15.0)
Hemoglobin: 11.6 g/dL — ABNORMAL LOW (ref 12.0–15.0)
Hemoglobin: 11.9 g/dL — ABNORMAL LOW (ref 12.0–15.0)
Hemoglobin: 11.9 g/dL — ABNORMAL LOW (ref 12.0–15.0)
Hemoglobin: 7.8 g/dL — ABNORMAL LOW (ref 12.0–15.0)
Hemoglobin: 8.5 g/dL — ABNORMAL LOW (ref 12.0–15.0)
Hemoglobin: 8.8 g/dL — ABNORMAL LOW (ref 12.0–15.0)
O2 Saturation: 100 %
O2 Saturation: 100 %
O2 Saturation: 100 %
O2 Saturation: 97 %
O2 Saturation: 98 %
O2 Saturation: 99 %
O2 Saturation: 99 %
Patient temperature: 37.1
Patient temperature: 37.1
Patient temperature: 37.3
Patient temperature: 37.5
Potassium: 3.3 mmol/L — ABNORMAL LOW (ref 3.5–5.1)
Potassium: 3.6 mmol/L (ref 3.5–5.1)
Potassium: 4.2 mmol/L (ref 3.5–5.1)
Potassium: 4.2 mmol/L (ref 3.5–5.1)
Potassium: 4.3 mmol/L (ref 3.5–5.1)
Potassium: 4.3 mmol/L (ref 3.5–5.1)
Potassium: 4.3 mmol/L (ref 3.5–5.1)
Sodium: 139 mmol/L (ref 135–145)
Sodium: 140 mmol/L (ref 135–145)
Sodium: 141 mmol/L (ref 135–145)
Sodium: 141 mmol/L (ref 135–145)
Sodium: 141 mmol/L (ref 135–145)
Sodium: 142 mmol/L (ref 135–145)
Sodium: 142 mmol/L (ref 135–145)
TCO2: 25 mmol/L (ref 22–32)
TCO2: 27 mmol/L (ref 22–32)
TCO2: 27 mmol/L (ref 22–32)
TCO2: 27 mmol/L (ref 22–32)
TCO2: 27 mmol/L (ref 22–32)
TCO2: 30 mmol/L (ref 22–32)
TCO2: 31 mmol/L (ref 22–32)
pCO2 arterial: 36.9 mmHg (ref 32–48)
pCO2 arterial: 41.1 mmHg (ref 32–48)
pCO2 arterial: 48.2 mmHg — ABNORMAL HIGH (ref 32–48)
pCO2 arterial: 52.8 mmHg — ABNORMAL HIGH (ref 32–48)
pCO2 arterial: 56.2 mmHg — ABNORMAL HIGH (ref 32–48)
pCO2 arterial: 56.6 mmHg — ABNORMAL HIGH (ref 32–48)
pCO2 arterial: 57.1 mmHg — ABNORMAL HIGH (ref 32–48)
pH, Arterial: 7.261 — ABNORMAL LOW (ref 7.35–7.45)
pH, Arterial: 7.267 — ABNORMAL LOW (ref 7.35–7.45)
pH, Arterial: 7.286 — ABNORMAL LOW (ref 7.35–7.45)
pH, Arterial: 7.303 — ABNORMAL LOW (ref 7.35–7.45)
pH, Arterial: 7.326 — ABNORMAL LOW (ref 7.35–7.45)
pH, Arterial: 7.451 — ABNORMAL HIGH (ref 7.35–7.45)
pH, Arterial: 7.461 — ABNORMAL HIGH (ref 7.35–7.45)
pO2, Arterial: 100 mmHg (ref 83–108)
pO2, Arterial: 128 mmHg — ABNORMAL HIGH (ref 83–108)
pO2, Arterial: 132 mmHg — ABNORMAL HIGH (ref 83–108)
pO2, Arterial: 147 mmHg — ABNORMAL HIGH (ref 83–108)
pO2, Arterial: 248 mmHg — ABNORMAL HIGH (ref 83–108)
pO2, Arterial: 395 mmHg — ABNORMAL HIGH (ref 83–108)
pO2, Arterial: 400 mmHg — ABNORMAL HIGH (ref 83–108)

## 2021-09-19 LAB — POCT I-STAT EG7
Acid-Base Excess: 2 mmol/L (ref 0.0–2.0)
Bicarbonate: 27.3 mmol/L (ref 20.0–28.0)
Calcium, Ion: 1.02 mmol/L — ABNORMAL LOW (ref 1.15–1.40)
HCT: 25 % — ABNORMAL LOW (ref 36.0–46.0)
Hemoglobin: 8.5 g/dL — ABNORMAL LOW (ref 12.0–15.0)
O2 Saturation: 71 %
Potassium: 3.7 mmol/L (ref 3.5–5.1)
Sodium: 141 mmol/L (ref 135–145)
TCO2: 29 mmol/L (ref 22–32)
pCO2, Ven: 43 mmHg — ABNORMAL LOW (ref 44–60)
pH, Ven: 7.411 (ref 7.25–7.43)
pO2, Ven: 37 mmHg (ref 32–45)

## 2021-09-19 LAB — GLUCOSE, CAPILLARY
Glucose-Capillary: 184 mg/dL — ABNORMAL HIGH (ref 70–99)
Glucose-Capillary: 140 mg/dL — ABNORMAL HIGH (ref 70–99)
Glucose-Capillary: 142 mg/dL — ABNORMAL HIGH (ref 70–99)
Glucose-Capillary: 148 mg/dL — ABNORMAL HIGH (ref 70–99)
Glucose-Capillary: 135 mg/dL — ABNORMAL HIGH (ref 70–99)
Glucose-Capillary: 138 mg/dL — ABNORMAL HIGH (ref 70–99)
Glucose-Capillary: 126 mg/dL — ABNORMAL HIGH (ref 70–99)
Glucose-Capillary: 125 mg/dL — ABNORMAL HIGH (ref 70–99)

## 2021-09-19 LAB — APTT
aPTT: 26 seconds (ref 24–36)
aPTT: 37 seconds — ABNORMAL HIGH (ref 24–36)

## 2021-09-19 LAB — CBC
HCT: 34.1 % — ABNORMAL LOW (ref 36.0–46.0)
HCT: 35.7 % — ABNORMAL LOW (ref 36.0–46.0)
Hemoglobin: 11.7 g/dL — ABNORMAL LOW (ref 12.0–15.0)
Hemoglobin: 12.2 g/dL (ref 12.0–15.0)
MCH: 27.4 pg (ref 26.0–34.0)
MCH: 27.4 pg (ref 26.0–34.0)
MCHC: 34.3 g/dL (ref 30.0–36.0)
MCHC: 34.2 g/dL (ref 30.0–36.0)
MCV: 79.9 fL — ABNORMAL LOW (ref 80.0–100.0)
MCV: 80.2 fL (ref 80.0–100.0)
Platelets: 92 10*3/uL — ABNORMAL LOW (ref 150–400)
Platelets: 113 10*3/uL — ABNORMAL LOW (ref 150–400)
RBC: 4.27 MIL/uL (ref 3.87–5.11)
RBC: 4.45 MIL/uL (ref 3.87–5.11)
RDW: 15.5 % (ref 11.5–15.5)
RDW: 15.5 % (ref 11.5–15.5)
WBC: 10.6 10*3/uL — ABNORMAL HIGH (ref 4.0–10.5)
WBC: 17.3 10*3/uL — ABNORMAL HIGH (ref 4.0–10.5)
nRBC: 0 % (ref 0.0–0.2)
nRBC: 0 % (ref 0.0–0.2)

## 2021-09-19 LAB — BASIC METABOLIC PANEL
Anion gap: 5 (ref 5–15)
BUN: 11 mg/dL (ref 8–23)
CO2: 24 mmol/L (ref 22–32)
Calcium: 7.7 mg/dL — ABNORMAL LOW (ref 8.9–10.3)
Chloride: 110 mmol/L (ref 98–111)
Creatinine, Ser: 0.65 mg/dL (ref 0.44–1.00)
GFR, Estimated: >60 mL/min (ref >60)
Glucose, Bld: 134 mg/dL — ABNORMAL HIGH (ref 70–99)
Potassium: 4.2 mmol/L (ref 3.5–5.1)
Sodium: 139 mmol/L (ref 135–145)

## 2021-09-19 LAB — PREPARE RBC (CROSSMATCH)

## 2021-09-19 LAB — PROTIME-INR
INR: 1.6 — ABNORMAL HIGH (ref 0.8–1.2)
Prothrombin Time: 18.4 seconds — ABNORMAL HIGH (ref 11.4–15.2)

## 2021-09-19 LAB — PLATELET COUNT: Platelets: 122 K/uL — ABNORMAL LOW (ref 150–400)

## 2021-09-19 LAB — HEMOGLOBIN AND HEMATOCRIT, BLOOD
HCT: 21.9 % — ABNORMAL LOW (ref 36.0–46.0)
Hemoglobin: 7.3 g/dL — ABNORMAL LOW (ref 12.0–15.0)

## 2021-09-19 LAB — MAGNESIUM: Magnesium: 2.8 mg/dL — ABNORMAL HIGH (ref 1.7–2.4)

## 2021-09-19 SURGERY — CORONARY ARTERY BYPASS GRAFTING (CABG)
Anesthesia: General | Site: Chest

## 2021-09-19 MED ORDER — LACTATED RINGERS IV SOLN: INTRAVENOUS | Status: DC | PRN

## 2021-09-19 MED ORDER — LACTATED RINGERS IV SOLN: INTRAVENOUS | Status: DC

## 2021-09-19 MED ORDER — MORPHINE SULFATE (PF) 2 MG/ML IV SOLN
1.0000 mg | INTRAVENOUS | Status: DC | PRN
  Administered 2021-09-19 (×2): 2 mg via INTRAVENOUS
  Filled 2021-09-19 (×2): qty 1

## 2021-09-19 MED ORDER — METOPROLOL TARTRATE 5 MG/5ML IV SOLN
2.5000 mg | INTRAVENOUS | Status: DC | PRN
  Administered 2021-09-20 – 2021-09-22 (×3): 2.5 mg via INTRAVENOUS
  Filled 2021-09-19 (×3): qty 5

## 2021-09-19 MED ORDER — SODIUM CHLORIDE 0.9% IV SOLUTION: Freq: Once | INTRAVENOUS | Status: DC

## 2021-09-19 MED ORDER — PROTAMINE SULFATE 10 MG/ML IV SOLN
INTRAVENOUS | Status: DC | PRN
  Administered 2021-09-19: 10 mg via INTRAVENOUS
  Administered 2021-09-19: 190 mg via INTRAVENOUS

## 2021-09-19 MED ORDER — MIDAZOLAM HCL 2 MG/2ML IJ SOLN
INTRAMUSCULAR | Status: DC | PRN
  Administered 2021-09-19: 1 mg via INTRAVENOUS
  Administered 2021-09-19 (×2): 2 mg via INTRAVENOUS
  Administered 2021-09-19 (×2): 1 mg via INTRAVENOUS
  Administered 2021-09-19: 3 mg via INTRAVENOUS

## 2021-09-19 MED ORDER — HEPARIN SODIUM (PORCINE) 1000 UNIT/ML IJ SOLN
INTRAMUSCULAR | Status: DC | PRN
  Administered 2021-09-19: 15000 [IU] via INTRAVENOUS
  Administered 2021-09-19: 5000 [IU] via INTRAVENOUS

## 2021-09-19 MED ORDER — ASPIRIN 81 MG PO CHEW: 324.0000 mg | CHEWABLE_TABLET | Freq: Every day | ORAL | Status: DC

## 2021-09-19 MED ORDER — CEFAZOLIN SODIUM-DEXTROSE 2-4 GM/100ML-% IV SOLN
2.0000 g | Freq: Three times a day (TID) | INTRAVENOUS | Status: AC
  Administered 2021-09-19 – 2021-09-21 (×6): 2 g via INTRAVENOUS
  Filled 2021-09-19 (×6): qty 100

## 2021-09-19 MED ORDER — BISACODYL 5 MG PO TBEC
10.0000 mg | DELAYED_RELEASE_TABLET | Freq: Every day | ORAL | Status: DC
  Administered 2021-09-20 – 2021-09-21 (×2): 10 mg via ORAL
  Filled 2021-09-19 (×4): qty 2

## 2021-09-19 MED ORDER — SODIUM CHLORIDE 0.9% FLUSH: 3.0000 mL | INTRAVENOUS | Status: DC | PRN

## 2021-09-19 MED ORDER — PHENYLEPHRINE HCL-NACL 20-0.9 MG/250ML-% IV SOLN
INTRAVENOUS | Status: DC | PRN
  Administered 2021-09-19: 15 ug/min via INTRAVENOUS

## 2021-09-19 MED ORDER — SODIUM CHLORIDE 0.9 % IV SOLN: INTRAVENOUS | Status: DC

## 2021-09-19 MED ORDER — VANCOMYCIN HCL IN DEXTROSE 1-5 GM/200ML-% IV SOLN
1000.0000 mg | Freq: Once | INTRAVENOUS | Status: AC
  Administered 2021-09-19: 1000 mg via INTRAVENOUS
  Filled 2021-09-19: qty 200

## 2021-09-19 MED ORDER — 0.9 % SODIUM CHLORIDE (POUR BTL) OPTIME
TOPICAL | Status: DC | PRN
Start: 2021-09-19 — End: 2021-09-19
  Administered 2021-09-19: 5000 mL

## 2021-09-19 MED ORDER — PHENYLEPHRINE HCL-NACL 20-0.9 MG/250ML-% IV SOLN: 0.0000 ug/min | INTRAVENOUS | Status: DC

## 2021-09-19 MED ORDER — POTASSIUM CHLORIDE 10 MEQ/50ML IV SOLN
10.0000 meq | INTRAVENOUS | Status: AC
  Administered 2021-09-19 (×3): 10 meq via INTRAVENOUS

## 2021-09-19 MED ORDER — ACETAMINOPHEN 650 MG RE SUPP
650.0000 mg | Freq: Once | RECTAL | Status: AC
  Administered 2021-09-19: 650 mg via RECTAL

## 2021-09-19 MED ORDER — CHLORHEXIDINE GLUCONATE 0.12 % MT SOLN
15.0000 mL | Freq: Once | OROMUCOSAL | Status: AC
  Administered 2021-09-19: 15 mL via OROMUCOSAL
  Filled 2021-09-19: qty 15

## 2021-09-19 MED ORDER — CHLORHEXIDINE GLUCONATE CLOTH 2 % EX PADS
6.0000 | MEDICATED_PAD | Freq: Every day | CUTANEOUS | Status: DC
  Administered 2021-09-19 – 2021-09-20 (×2): 6 via TOPICAL

## 2021-09-19 MED ORDER — SODIUM CHLORIDE 0.9% FLUSH
3.0000 mL | Freq: Two times a day (BID) | INTRAVENOUS | Status: DC
  Administered 2021-09-20 – 2021-09-24 (×6): 3 mL via INTRAVENOUS

## 2021-09-19 MED ORDER — ACETAMINOPHEN 160 MG/5ML PO SOLN: 650.0000 mg | Freq: Once | ORAL | Status: AC

## 2021-09-19 MED ORDER — HEMOSTATIC AGENTS (NO CHARGE) OPTIME
TOPICAL | Status: DC | PRN
  Administered 2021-09-19: 1 via TOPICAL

## 2021-09-19 MED ORDER — ONDANSETRON HCL 4 MG/2ML IJ SOLN
4.0000 mg | Freq: Four times a day (QID) | INTRAMUSCULAR | Status: DC | PRN
  Administered 2021-09-21: 4 mg via INTRAVENOUS
  Filled 2021-09-19 (×2): qty 2

## 2021-09-19 MED ORDER — PAPAVERINE HCL 30 MG/ML IJ SOLN: INTRAMUSCULAR | Status: DC | PRN

## 2021-09-19 MED ORDER — INSULIN ASPART 100 UNIT/ML IJ SOLN
INTRAMUSCULAR | Status: AC
  Filled 2021-09-19: qty 1

## 2021-09-19 MED ORDER — SODIUM CHLORIDE 0.9 % IV SOLN: 250.0000 mL | INTRAVENOUS | Status: DC

## 2021-09-19 MED ORDER — THROMBIN 20000 UNITS EX SOLR: OROMUCOSAL | Status: DC | PRN

## 2021-09-19 MED ORDER — CHLORHEXIDINE GLUCONATE 0.12 % MT SOLN
15.0000 mL | OROMUCOSAL | Status: AC
  Administered 2021-09-19: 15 mL via OROMUCOSAL

## 2021-09-19 MED ORDER — ~~LOC~~ CARDIAC SURGERY, PATIENT & FAMILY EDUCATION
Freq: Once | Status: DC
  Filled 2021-09-19: qty 1

## 2021-09-19 MED ORDER — ACETAMINOPHEN 160 MG/5ML PO SOLN: 1000.0000 mg | Freq: Four times a day (QID) | ORAL | Status: DC

## 2021-09-19 MED ORDER — DEXTROSE 50 % IV SOLN: 0.0000 mL | INTRAVENOUS | Status: DC | PRN

## 2021-09-19 MED ORDER — ROSUVASTATIN CALCIUM 20 MG PO TABS
20.0000 mg | ORAL_TABLET | Freq: Every evening | ORAL | Status: DC
  Administered 2021-09-20 – 2021-09-23 (×4): 20 mg via ORAL
  Filled 2021-09-19 (×4): qty 1

## 2021-09-19 MED ORDER — ROCURONIUM 10MG/ML (10ML) SYRINGE FOR MEDFUSION PUMP - OPTIME
INTRAVENOUS | Status: DC | PRN
  Administered 2021-09-19: 70 mg via INTRAVENOUS
  Administered 2021-09-19: 30 mg via INTRAVENOUS
  Administered 2021-09-19: 40 mg via INTRAVENOUS
  Administered 2021-09-19: 20 mg via INTRAVENOUS

## 2021-09-19 MED ORDER — SERTRALINE HCL 100 MG PO TABS
200.0000 mg | ORAL_TABLET | Freq: Every day | ORAL | Status: DC
  Administered 2021-09-20 – 2021-09-24 (×5): 200 mg via ORAL
  Filled 2021-09-19 (×5): qty 2

## 2021-09-19 MED ORDER — NITROGLYCERIN IN D5W 200-5 MCG/ML-% IV SOLN
0.0000 ug/min | INTRAVENOUS | Status: DC
  Administered 2021-09-20: 30 ug/min via INTRAVENOUS
  Administered 2021-09-20: 80 ug/min via INTRAVENOUS
  Filled 2021-09-19 (×2): qty 250

## 2021-09-19 MED ORDER — MIDAZOLAM HCL 2 MG/2ML IJ SOLN: 2.0000 mg | INTRAMUSCULAR | Status: DC | PRN

## 2021-09-19 MED ORDER — ALBUMIN HUMAN 5 % IV SOLN: INTRAVENOUS | Status: DC | PRN

## 2021-09-19 MED ORDER — FAMOTIDINE IN NACL 20-0.9 MG/50ML-% IV SOLN
20.0000 mg | Freq: Two times a day (BID) | INTRAVENOUS | Status: AC
  Administered 2021-09-19 (×2): 20 mg via INTRAVENOUS
  Filled 2021-09-19 (×2): qty 50

## 2021-09-19 MED ORDER — ASPIRIN EC 325 MG PO TBEC
325.0000 mg | DELAYED_RELEASE_TABLET | Freq: Every day | ORAL | Status: DC
  Administered 2021-09-20 – 2021-09-24 (×5): 325 mg via ORAL
  Filled 2021-09-19 (×5): qty 1

## 2021-09-19 MED ORDER — INSULIN ASPART 100 UNIT/ML IJ SOLN
0.0000 [IU] | INTRAMUSCULAR | Status: DC | PRN
  Administered 2021-09-19: 2 [IU] via SUBCUTANEOUS

## 2021-09-19 MED ORDER — TRAMADOL HCL 50 MG PO TABS
50.0000 mg | ORAL_TABLET | ORAL | Status: DC | PRN
  Administered 2021-09-20 (×2): 100 mg via ORAL
  Filled 2021-09-19 (×2): qty 2

## 2021-09-19 MED ORDER — DOCUSATE SODIUM 100 MG PO CAPS
200.0000 mg | ORAL_CAPSULE | Freq: Every day | ORAL | Status: DC
  Administered 2021-09-20 – 2021-09-24 (×5): 200 mg via ORAL
  Filled 2021-09-19 (×5): qty 2

## 2021-09-19 MED ORDER — LACTATED RINGERS IV SOLN: 500.0000 mL | Freq: Once | INTRAVENOUS | Status: DC | PRN

## 2021-09-19 MED ORDER — ALBUMIN HUMAN 5 % IV SOLN
250.0000 mL | INTRAVENOUS | Status: DC | PRN
  Administered 2021-09-19: 12.5 g via INTRAVENOUS

## 2021-09-19 MED ORDER — NITROGLYCERIN 0.2 MG/ML ON CALL CATH LAB
INTRAVENOUS | Status: DC | PRN
  Administered 2021-09-19 (×6): 40 ug via INTRAVENOUS

## 2021-09-19 MED ORDER — SODIUM CHLORIDE 0.45 % IV SOLN: INTRAVENOUS | Status: DC | PRN

## 2021-09-19 MED ORDER — ACETAMINOPHEN 500 MG PO TABS
1000.0000 mg | ORAL_TABLET | Freq: Four times a day (QID) | ORAL | Status: DC
  Administered 2021-09-19 – 2021-09-24 (×16): 1000 mg via ORAL
  Filled 2021-09-19 (×17): qty 2

## 2021-09-19 MED ORDER — PANTOPRAZOLE SODIUM 40 MG PO TBEC
40.0000 mg | DELAYED_RELEASE_TABLET | Freq: Every day | ORAL | Status: DC
  Administered 2021-09-21 – 2021-09-24 (×4): 40 mg via ORAL
  Filled 2021-09-19 (×4): qty 1

## 2021-09-19 MED ORDER — OXYCODONE HCL 5 MG PO TABS
5.0000 mg | ORAL_TABLET | ORAL | Status: DC | PRN
  Administered 2021-09-20: 5 mg via ORAL
  Administered 2021-09-20 – 2021-09-22 (×9): 10 mg via ORAL
  Filled 2021-09-19: qty 1
  Filled 2021-09-19 (×9): qty 2

## 2021-09-19 MED ORDER — METOPROLOL TARTRATE 25 MG/10 ML ORAL SUSPENSION
12.5000 mg | Freq: Two times a day (BID) | ORAL | Status: DC
  Administered 2021-09-19: 12.5 mg
  Filled 2021-09-19: qty 5

## 2021-09-19 MED ORDER — THROMBIN (RECOMBINANT) 20000 UNITS EX SOLR
CUTANEOUS | Status: AC
  Filled 2021-09-19: qty 20000

## 2021-09-19 MED ORDER — INSULIN REGULAR(HUMAN) IN NACL 100-0.9 UT/100ML-% IV SOLN: INTRAVENOUS | Status: DC

## 2021-09-19 MED ORDER — BISACODYL 10 MG RE SUPP: 10.0000 mg | Freq: Every day | RECTAL | Status: DC

## 2021-09-19 MED ORDER — BUPROPION HCL ER (SR) 150 MG PO TB12
150.0000 mg | ORAL_TABLET | Freq: Two times a day (BID) | ORAL | Status: DC
  Administered 2021-09-20 – 2021-09-24 (×9): 150 mg via ORAL
  Filled 2021-09-19 (×10): qty 1

## 2021-09-19 MED ORDER — METOPROLOL TARTRATE 12.5 MG HALF TABLET: 12.5000 mg | ORAL_TABLET | Freq: Two times a day (BID) | ORAL | Status: DC

## 2021-09-19 MED ORDER — FENTANYL CITRATE (PF) 250 MCG/5ML IJ SOLN
INTRAMUSCULAR | Status: DC | PRN
  Administered 2021-09-19: 675 ug via INTRAVENOUS
  Administered 2021-09-19 (×3): 25 ug via INTRAVENOUS
  Administered 2021-09-19: 100 ug via INTRAVENOUS
  Administered 2021-09-19: 50 ug via INTRAVENOUS
  Administered 2021-09-19 (×3): 100 ug via INTRAVENOUS
  Administered 2021-09-19: 50 ug via INTRAVENOUS

## 2021-09-19 MED ORDER — METOPROLOL TARTRATE 12.5 MG HALF TABLET
12.5000 mg | ORAL_TABLET | Freq: Once | ORAL | Status: AC
  Administered 2021-09-19: 12.5 mg via ORAL
  Filled 2021-09-19: qty 1

## 2021-09-19 MED ORDER — MAGNESIUM SULFATE 4 GM/100ML IV SOLN
4.0000 g | Freq: Once | INTRAVENOUS | Status: AC
  Administered 2021-09-19: 4 g via INTRAVENOUS
  Filled 2021-09-19: qty 100

## 2021-09-19 MED ORDER — DEXMEDETOMIDINE HCL IN NACL 400 MCG/100ML IV SOLN: 0.0000 ug/kg/h | INTRAVENOUS | Status: DC

## 2021-09-19 MED ORDER — CHLORHEXIDINE GLUCONATE 4 % EX LIQD: 30.0000 mL | CUTANEOUS | Status: DC

## 2021-09-19 MED ORDER — PROPOFOL 10 MG/ML IV BOLUS
INTRAVENOUS | Status: DC | PRN
  Administered 2021-09-19: 20 mg via INTRAVENOUS

## 2021-09-19 SURGICAL SUPPLY — 124 items
ADAPTER CARDIO PERF ANTE/RETRO (ADAPTER) ×3 IMPLANT
BAG DECANTER FOR FLEXI CONT (MISCELLANEOUS) ×3 IMPLANT
BLADE CLIPPER SURG (BLADE) ×3 IMPLANT
BLADE STERNUM SYSTEM 6 (BLADE) ×3 IMPLANT
BLADE SURG 15 STRL LF DISP TIS (BLADE) ×2 IMPLANT
BLADE SURG 15 STRL SS (BLADE) ×1
BNDG ELASTIC 4X5.8 VLCR STR LF (GAUZE/BANDAGES/DRESSINGS) ×2 IMPLANT
BNDG ELASTIC 6X5.8 VLCR STR LF (GAUZE/BANDAGES/DRESSINGS) ×2 IMPLANT
BNDG GAUZE ELAST 4 BULKY (GAUZE/BANDAGES/DRESSINGS) ×2 IMPLANT
CANISTER SUCT 3000ML PPV (MISCELLANEOUS) ×3 IMPLANT
CANNULA GUNDRY RCSP 15FR (MISCELLANEOUS) ×3 IMPLANT
CATH HEART VENT LEFT (CATHETERS) ×2 IMPLANT
CATH ROBINSON RED A/P 18FR (CATHETERS) ×9 IMPLANT
CATH THORACIC 28FR (CATHETERS) ×3 IMPLANT
CATH THORACIC 36FR (CATHETERS) ×3 IMPLANT
CATH THORACIC 36FR RT ANG (CATHETERS) ×3 IMPLANT
CATH VENT ARGYLE 51 18F LT (CATHETERS) ×1 IMPLANT
CLIP TI WIDE RED SMALL 24 (CLIP) ×1 IMPLANT
CLIP VESOCCLUDE MED 24/CT (CLIP) IMPLANT
CLIP VESOCCLUDE SM WIDE 24/CT (CLIP) IMPLANT
CNTNR URN SCR LID CUP LEK RST (MISCELLANEOUS) ×2 IMPLANT
CONT SPEC 4OZ STRL OR WHT (MISCELLANEOUS) ×1
CONTAINER PROTECT SURGISLUSH (MISCELLANEOUS) ×6 IMPLANT
COVER SURGICAL LIGHT HANDLE (MISCELLANEOUS) ×3 IMPLANT
DEVICE SUT CK QUICK LOAD MINI (Prosthesis & Implant Heart) ×1 IMPLANT
DRAPE CARDIOVASCULAR INCISE (DRAPES) ×1
DRAPE SRG 135X102X78XABS (DRAPES) ×2 IMPLANT
DRAPE WARM FLUID 44X44 (DRAPES) ×3 IMPLANT
DRSG COVADERM 4X14 (GAUZE/BANDAGES/DRESSINGS) ×3 IMPLANT
ELECT CAUTERY BLADE 6.4 (BLADE) ×3 IMPLANT
ELECT REM PT RETURN 9FT ADLT (ELECTROSURGICAL) ×6
ELECTRODE REM PT RTRN 9FT ADLT (ELECTROSURGICAL) ×4 IMPLANT
FELT TEFLON 1X6 (MISCELLANEOUS) ×6 IMPLANT
GAUZE 4X4 16PLY ~~LOC~~+RFID DBL (SPONGE) ×3 IMPLANT
GAUZE SPONGE 4X4 12PLY STRL (GAUZE/BANDAGES/DRESSINGS) ×6 IMPLANT
GAUZE SPONGE 4X4 12PLY STRL LF (GAUZE/BANDAGES/DRESSINGS) ×1 IMPLANT
GLOVE BIO SURGEON STRL SZ 6 (GLOVE) ×2 IMPLANT
GLOVE BIO SURGEON STRL SZ7.5 (GLOVE) ×1 IMPLANT
GLOVE BIOGEL PI IND STRL 6 (GLOVE) IMPLANT
GLOVE BIOGEL PI IND STRL 9 (GLOVE) IMPLANT
GLOVE BIOGEL PI INDICATOR 6 (GLOVE) ×2
GLOVE BIOGEL PI INDICATOR 9 (GLOVE) ×1
GLOVE SURG ENC MOIS LTX SZ6 (GLOVE) IMPLANT
GLOVE SURG ENC MOIS LTX SZ6.5 (GLOVE) IMPLANT
GLOVE SURG ENC MOIS LTX SZ7 (GLOVE) IMPLANT
GLOVE SURG ENC MOIS LTX SZ7.5 (GLOVE) IMPLANT
GLOVE SURG MICRO LTX SZ7 (GLOVE) ×6 IMPLANT
GLOVE SURG ORTHO LTX SZ7.5 (GLOVE) IMPLANT
GLOVE SURG SS PI 7.5 STRL IVOR (GLOVE) ×1 IMPLANT
GLOVE SURG UNDER POLY LF SZ6 (GLOVE) IMPLANT
GLOVE SURG UNDER POLY LF SZ6.5 (GLOVE) IMPLANT
GLOVE SURG UNDER POLY LF SZ7 (GLOVE) IMPLANT
GOWN STRL REUS W/ TWL LRG LVL3 (GOWN DISPOSABLE) ×8 IMPLANT
GOWN STRL REUS W/ TWL XL LVL3 (GOWN DISPOSABLE) ×2 IMPLANT
GOWN STRL REUS W/TWL LRG LVL3 (GOWN DISPOSABLE) ×6
GOWN STRL REUS W/TWL XL LVL3 (GOWN DISPOSABLE) ×1
HEMOSTAT POWDER SURGIFOAM 1G (HEMOSTASIS) ×9 IMPLANT
HEMOSTAT SURGICEL 2X14 (HEMOSTASIS) ×3 IMPLANT
INSERT FOGARTY 61MM (MISCELLANEOUS) IMPLANT
INSERT FOGARTY XLG (MISCELLANEOUS) IMPLANT
KIT BASIN OR (CUSTOM PROCEDURE TRAY) ×3 IMPLANT
KIT CATH CPB BARTLE (MISCELLANEOUS) ×3 IMPLANT
KIT SUCTION CATH 14FR (SUCTIONS) ×3 IMPLANT
KIT SUT CK MINI COMBO 4X17 (Prosthesis & Implant Heart) ×1 IMPLANT
KIT TURNOVER KIT B (KITS) ×3 IMPLANT
LINE VENT (MISCELLANEOUS) ×1 IMPLANT
NS IRRIG 1000ML POUR BTL (IV SOLUTION) ×18 IMPLANT
PACK E OPEN HEART (SUTURE) ×3 IMPLANT
PACK OPEN HEART (CUSTOM PROCEDURE TRAY) ×3 IMPLANT
PAD ARMBOARD 7.5X6 YLW CONV (MISCELLANEOUS) ×6 IMPLANT
PAD ELECT DEFIB RADIOL ZOLL (MISCELLANEOUS) ×3 IMPLANT
PENCIL BUTTON HOLSTER BLD 10FT (ELECTRODE) ×3 IMPLANT
POSITIONER HEAD DONUT 9IN (MISCELLANEOUS) ×3 IMPLANT
PUNCH AORTIC ROTATE 4.0MM (MISCELLANEOUS) IMPLANT
PUNCH AORTIC ROTATE 4.5MM 8IN (MISCELLANEOUS) ×3 IMPLANT
PUNCH AORTIC ROTATE 5MM 8IN (MISCELLANEOUS) IMPLANT
SET MPS 3-ND DEL (MISCELLANEOUS) ×1 IMPLANT
SPONGE INTESTINAL PEANUT (DISPOSABLE) IMPLANT
SPONGE T-LAP 18X18 ~~LOC~~+RFID (SPONGE) ×12 IMPLANT
SPONGE T-LAP 4X18 ~~LOC~~+RFID (SPONGE) ×6 IMPLANT
SUPPORT HEART JANKE-BARRON (MISCELLANEOUS) ×2 IMPLANT
SUT BONE WAX W31G (SUTURE) ×3 IMPLANT
SUT EB EXC GRN/WHT 2-0 V-5 (SUTURE) ×6 IMPLANT
SUT ETHIBON EXCEL 2-0 V-5 (SUTURE) ×8 IMPLANT
SUT ETHIBOND 2 0 SH (SUTURE) ×1
SUT ETHIBOND 2 0 SH 36X2 (SUTURE) IMPLANT
SUT ETHIBOND V-5 VALVE (SUTURE) ×8 IMPLANT
SUT MNCRL AB 4-0 PS2 18 (SUTURE) IMPLANT
SUT PROLENE 3 0 SH DA (SUTURE) IMPLANT
SUT PROLENE 3 0 SH1 36 (SUTURE) ×3 IMPLANT
SUT PROLENE 4 0 RB 1 (SUTURE) ×6
SUT PROLENE 4 0 SH DA (SUTURE) IMPLANT
SUT PROLENE 4-0 RB1 .5 CRCL 36 (SUTURE) ×6 IMPLANT
SUT PROLENE 5 0 C 1 36 (SUTURE) IMPLANT
SUT PROLENE 6 0 C 1 30 (SUTURE) IMPLANT
SUT PROLENE 7 0 BV 1 (SUTURE) IMPLANT
SUT PROLENE 7 0 BV1 MDA (SUTURE) ×3 IMPLANT
SUT PROLENE 8 0 BV175 6 (SUTURE) IMPLANT
SUT SILK  1 MH (SUTURE)
SUT SILK 1 MH (SUTURE) IMPLANT
SUT SILK 2 0 SH (SUTURE) IMPLANT
SUT SILK 2 0 SH CR/8 (SUTURE) ×1 IMPLANT
SUT STEEL 6MS V (SUTURE) ×2 IMPLANT
SUT STEEL STERNAL CCS#1 18IN (SUTURE) IMPLANT
SUT STEEL SZ 6 DBL 3X14 BALL (SUTURE) IMPLANT
SUT VIC AB 1 CTX 36 (SUTURE) ×4
SUT VIC AB 1 CTX36XBRD ANBCTR (SUTURE) ×4 IMPLANT
SUT VIC AB 2-0 CT1 27 (SUTURE)
SUT VIC AB 2-0 CT1 TAPERPNT 27 (SUTURE) IMPLANT
SUT VIC AB 2-0 CTX 27 (SUTURE) IMPLANT
SUT VIC AB 3-0 SH 27 (SUTURE)
SUT VIC AB 3-0 SH 27X BRD (SUTURE) IMPLANT
SUT VIC AB 3-0 X1 27 (SUTURE) IMPLANT
SUT VICRYL 4-0 PS2 18IN ABS (SUTURE) IMPLANT
SYSTEM SAHARA CHEST DRAIN ATS (WOUND CARE) ×3 IMPLANT
TAPE CLOTH SURG 4X10 WHT LF (GAUZE/BANDAGES/DRESSINGS) ×1 IMPLANT
TAPE PAPER 2X10 WHT MICROPORE (GAUZE/BANDAGES/DRESSINGS) ×1 IMPLANT
TOWEL GREEN STERILE (TOWEL DISPOSABLE) ×3 IMPLANT
TOWEL GREEN STERILE FF (TOWEL DISPOSABLE) ×3 IMPLANT
TRAY FOLEY SLVR 16FR TEMP STAT (SET/KITS/TRAYS/PACK) ×3 IMPLANT
UNDERPAD 30X36 HEAVY ABSORB (UNDERPADS AND DIAPERS) ×3 IMPLANT
VALVE AORTIC SZ19 INSP/RESIL (Valve) ×1 IMPLANT
VENT LEFT HEART 12002 (CATHETERS)
WATER STERILE IRR 1000ML POUR (IV SOLUTION) ×6 IMPLANT

## 2021-09-19 NOTE — Interval H&P Note (Signed)
History and Physical Interval Note: ? ?09/19/2021 ?6:33 AM ? ?Katie Buckley  has presented today for surgery, with the diagnosis of SEVERE AS ?CAD.  The various methods of treatment have been discussed with the patient and family. After consideration of risks, benefits and other options for treatment, the patient has consented to  Procedure(s): ?CORONARY ARTERY BYPASS GRAFTING (CABG) (N/A) ?AORTIC VALVE REPLACEMENT (AVR) (N/A) ?TRANSESOPHAGEAL ECHOCARDIOGRAM (TEE) (N/A) as a surgical intervention.  The patient's history has been reviewed, patient examined, no change in status, stable for surgery.  I have reviewed the patient's chart and labs.  Questions were answered to the patient's satisfaction.   ? ? ?Gaye Pollack ? ? ?

## 2021-09-19 NOTE — Discharge Instructions (Signed)

## 2021-09-19 NOTE — Procedures (Signed)
Extubation Procedure Note ?Dr. Cyndia Bent made aware of no cuff leak & ABG outside requirements. He gave OK to proceed with extubation. Pt follows commands. NIF -20, VC 63ms. Pt extubated to 2L [place].  ? ?Patient Details:   ?Name: Katie Buckley ?DOB: 309-Jun-1952?MRN: [id number]?  ?Vent end date: 09/19/21 Vent end time: 2025  ? ?Evaluation ? O2 sats: stable throughout ?Complications: No apparent complications ?Patient did tolerate procedure well. ?Bilateral Breath Sounds: Clear, Diminished ?  ?Yes ? ?DClaudell KyleC ?09/19/2021, 8:30 PM ? ?

## 2021-09-19 NOTE — Progress Notes (Signed)
Verbal order from Dr.Bartle to continue with NIF/VC regardless of ABG results and absence of cuff leak and difficult airway. ? ?

## 2021-09-19 NOTE — Transfer of Care (Signed)
Immediate Anesthesia Transfer of Care Note ? ?Patient: Katie Buckley ? ?Procedure(s) Performed: CORONARY ARTERY BYPASS GRAFTING X1 Using Left Internal Mammory artery (Chest) ?AORTIC VALVE REPLACEMENT Using 40m Edwards Resilia Inspiris Valve (Chest) ?TRANSESOPHAGEAL ECHOCARDIOGRAM (TEE) ? ?Patient Location: SICU ? ?Anesthesia Type:General ? ?Level of Consciousness: Patient remains intubated per anesthesia plan ? ?Airway & Oxygen Therapy: Patient remains intubated per anesthesia plan ? ?Post-op Assessment: Report given to RN and Post -op Vital signs reviewed and stable ? ?Post vital signs: Reviewed and stable ? ?Last Vitals:  ?Vitals Value Taken Time  ?BP    ?Temp 36.9 ?C 09/19/21 1339  ?Pulse 87 09/19/21 1339  ?Resp 12 09/19/21 1339  ?SpO2 95 % 09/19/21 1339  ?Vitals shown include unvalidated device data. ? ?Last Pain:  ?Vitals:  ? 09/19/21 0554  ?TempSrc: Oral  ?   ? ?  ? ?Complications: No notable events documented. ?

## 2021-09-19 NOTE — Progress Notes (Signed)
Helping unit RT- per RN pt just changed to SIMV/PSV rate 4, 40% per SICU wean protocol.  Pt appears to be tol well currently.   ?

## 2021-09-19 NOTE — Progress Notes (Signed)
RT NOTE: ? ?ABG collected while patient weaning on PS 10/5 & 40% ? 09/19/21 19:23  ?Sample type ARTERIAL  ?pH, Arterial 7.286 (L)  ?pCO2 arterial 52.8 (H)  ?pO2, Arterial 132 (H)  ?TCO2 27  ?Acid-base deficit 2.0  ?Bicarbonate 25.1  ?O2 Saturation 99  ?Patient temp 37.1 C  ? ?Settings switched back to full support 450 / 18 / 40% / 5+ until further instruction for MD. RN reporting ABG to MD.  ?

## 2021-09-19 NOTE — Anesthesia Procedure Notes (Signed)
Procedure Name: Intubation ?Date/Time: 09/19/2021 8:01 AM ?Performed by: Clearnce Sorrel, CRNA ?Pre-anesthesia Checklist: Patient identified, Emergency Drugs available, Suction available and Patient being monitored ?Patient Re-evaluated:Patient Re-evaluated prior to induction ?Oxygen Delivery Method: Circle System Utilized ?Preoxygenation: Pre-oxygenation with 100% oxygen ?Induction Type: IV induction ?Ventilation: Mask ventilation without difficulty ?Laryngoscope Size: Glidescope and 3 (MP 4) ?Grade View: Grade II ?Tube type: Oral ?Tube size: 8.0 mm ?Number of attempts: 1 ?Airway Equipment and Method: Stylet and Oral airway ?Placement Confirmation: ETT inserted through vocal cords under direct vision, positive ETCO2 and breath sounds checked- equal and bilateral ?Secured at: 21 cm ?Tube secured with: Tape ?Dental Injury: Teeth and Oropharynx as per pre-operative assessment  ? ? ? ? ?

## 2021-09-19 NOTE — Progress Notes (Signed)
Patient ID: Katie Buckley, female   DOB: 1950/12/13, 71 y.o.   MRN: [id number] ? ? ?TCTS Evening Rounds:  ? ?Hemodynamically stable  ?CI = 2 ? ?Has started to wake up on vent.  ? ?Urine output good  ?CT output low ? ?CBC ?   ?Component Value Date/Time  ? WBC 10.6 (H) 09/19/2021 1336  ? RBC 4.27 09/19/2021 1336  ? HGB 11.7 (L) 09/19/2021 1336  ? HGB 11.2 09/01/2020 0907  ? HCT 34.1 (L) 09/19/2021 1336  ? HCT 35.2 09/01/2020 0907  ? PLT 92 (L) 09/19/2021 1336  ? PLT 212 09/01/2020 0907  ? MCV 79.9 (L) 09/19/2021 1336  ? MCV 82 09/01/2020 0907  ? MCV 86 02/08/2014 1717  ? MCH 27.4 09/19/2021 1336  ? MCHC 34.3 09/19/2021 1336  ? RDW 15.5 09/19/2021 1336  ? RDW 13.7 09/01/2020 0907  ? RDW 14.0 02/08/2014 1717  ? LYMPHSABS 1.5 04/30/2013 0524  ? MONOABS 0.2 04/30/2013 0524  ? EOSABS 0.0 04/30/2013 0524  ? BASOSABS 0.0 04/30/2013 0524  ? ? ? ?BMET ?   ?Component Value Date/Time  ? NA 138 09/19/2021 1223  ? NA 135 02/09/2021 1107  ? NA 138 02/08/2014 1717  ? K 3.4 (L) 09/19/2021 1223  ? K 3.7 02/08/2014 1717  ? CL 100 09/19/2021 1223  ? CL 99 02/08/2014 1717  ? CO2 25 09/15/2021 1200  ? CO2 31 02/08/2014 1717  ? GLUCOSE 227 (H) 09/19/2021 1223  ? GLUCOSE 155 (H) 02/08/2014 1717  ? BUN 10 09/19/2021 1223  ? BUN 11 02/09/2021 1107  ? BUN 13 02/08/2014 1717  ? CREATININE 0.40 (L) 09/19/2021 1223  ? CREATININE 0.61 05/01/2017 0842  ? CALCIUM 9.6 09/15/2021 1200  ? CALCIUM 9.0 02/08/2014 1717  ? EGFR 75 02/09/2021 1107  ? GFRNONAA >60 09/15/2021 1200  ? GFRNONAA 95 05/01/2017 0842  ? ? ? ?A/P:  Stable postop course. Continue current plans ? ?

## 2021-09-19 NOTE — Hospital Course (Addendum)
History of Present Illness: ? ?This 71 year old woman has stage D, severe, symptomatic low gradient, normal EF aortic stenosis with New York Heart Association class III symptoms of exertional fatigue and shortness of breath consistent with chronic diastolic congestive heart failure.  She is also having repeated episodes of dizziness and has had several falls.  I have personally reviewed her 2D echocardiogram, cardiac catheterization, and CTA studies and her echocardiogram shows a severely calcified and thickened aortic valve with a mean gradient that is only 18 mmHg with a valve area of 0.79 cm?.  There appears to be moderate aortic insufficiency.  Left ventricular systolic function is normal.  Cardiac catheterization shows a 70% mid LAD stenosis with a positive FFR of 0.7.  Aortic valve area was measured at 0.72 cm? with 2+ aortic insufficiency.  I agree that aortic valve replacement is indicated in this patient for relief of her symptoms and to prevent left ventricular deterioration.  Her gated cardiac CTA shows a relatively small aortic annular measurement of 321 mm? which would only allow insertion of a small 20 mm SAPIEN 3 TAVR valve.  Her aortic sinuses are small and are not suitable for a Medtronic Evolut valve.  Therefore I think the best option for her is open surgical aortic valve replacement using a bioprosthetic valve and coronary bypass to the LAD using a LIMA graft.  This will give her the best long-term result.  I reviewed all of the studies with the patient and her husband and daughter.  They are in agreement with proceeding with surgical aortic valve replacement and CABG. I discussed the operative procedure with the patient and family including alternatives, benefits and risks; including but not limited to bleeding, blood transfusion, infection, stroke, myocardial infarction, graft failure, heart block requiring a permanent pacemaker, organ dysfunction, and death.  [name] understands and  agrees to proceed.   ?  ? ?Hospital Course: ?[name] was admitted for elective surgery on 09/19/2021.  She was taken to the operating where single-vessel coronary bypass grafting was accomplished utilizing left internal mammary artery to the left anterior descending coronary artery.  Aortic valve replacement was also accomplished with a 19 mm Edwards Inspiris Resilia pericardial tissue valve.  She was transfused with 3 units packed red cells intraoperatively.  At the conclusion of the procedure, she separated from cardiopulmonary bypass without requiring any inotropic support.  She was transferred to the surgical ICU in stable condition.  Vital signs and hemodynamics remained stable.  On the first attempt to wean the ventilator, she was hypercarbic with the PCO2 of 53.  She was placed back on full support.  On the next attempt about an hour later, she was fully awake and had adequate respiratory mechanics.  She was extubated to 2 L nasal cannula.  On postop day 1, she was in sinus rhythm and was mildly hypertensive.  She was started back on her Avapro.  The insulin drip and glucose stabilizer was transitioned to sliding scale insulin and Levemir.  The chest tubes and monitoring lines were removed.  She was started on diuretics for a 13-pound volume excess.  On postop day 3, she developed atrial fibrillation RVR.  Ventricular rate ranged from 130s to 150s.  She was started on amiodarone per protocol with IV bolus and infusion.  Within a few hours, she had converted back to stable sinus rhythm.  We will continue the infusions overnight and on postop day 4, she was transitioned to oral amiodarone.  The pacer  wires were removed at that point as she was in stable sinus rhythm with satisfactory rate.  She progressed to independence with her mobility.  She was tolerating adequate oral intake and having appropriate bowel and bladder function at the time of discharge. ? ?

## 2021-09-19 NOTE — Op Note (Signed)
?CARDIOVASCULAR SURGERY OPERATIVE NOTE ? ?09/19/2021 ? ?Surgeon:  Gaye Pollack, MD ? ?First Assistant: Enid Cutter,  PA-C:   An experienced assistant was required given the complexity of this surgery and the standard of surgical care. The assistant was needed for exposure, dissection, suctioning, retraction of delicate tissues and sutures, instrument exchange and for overall help during this procedure. ? ? ?Preoperative Diagnosis:  Severe multi-vessel coronary artery disease ? ? ?Postoperative Diagnosis:  Same ? ? ?Procedure: ? ?Median Sternotomy ?Extracorporeal circulation ?3.   Coronary artery bypass grafting x 1 ? ?Left internal mammary artery graft to the LAD ? ? ? ?4.   Aortic valve replacement using a 19 mm Edwards INSPIRIS RESILIA pericardial valve. BSA 1.6 m2 ? ? ?Anesthesia:  General Endotracheal ? ? ?Clinical History/Surgical Indication: ? ?This 71 year old woman has stage D, severe, symptomatic low gradient, normal EF aortic stenosis with New York Heart Association class III symptoms of exertional fatigue and shortness of breath consistent with chronic diastolic congestive heart failure.  She is also having repeated episodes of dizziness and has had several falls.  I have personally reviewed her 2D echocardiogram, cardiac catheterization, and CTA studies and her echocardiogram shows a severely calcified and thickened aortic valve with a mean gradient that is only 18 mmHg with a valve area of 0.79 cm?.  There appears to be moderate aortic insufficiency.  Left ventricular systolic function is normal.  Cardiac catheterization shows a 70% mid LAD stenosis with a positive FFR of 0.7.  Aortic valve area was measured at 0.72 cm? with 2+ aortic insufficiency.  I agree that aortic valve replacement is indicated in this patient for relief of her symptoms and to prevent left ventricular deterioration.  Her gated  cardiac CTA shows a relatively small aortic annular measurement of 321 mm? which would only allow insertion of a small 20 mm SAPIEN 3 TAVR valve.  Her aortic sinuses are small and are not suitable for a Medtronic Evolut valve.  Therefore I think the best option for her is open surgical aortic valve replacement using a bioprosthetic valve and coronary bypass to the LAD using a LIMA graft.  This will give her the best long-term result.  I reviewed all of the studies with the patient and her husband and daughter.  They are in agreement with proceeding with surgical aortic valve replacement and CABG. I discussed the operative procedure with the patient and family including alternatives, benefits and risks; including but not limited to bleeding, blood transfusion, infection, stroke, myocardial infarction, graft failure, heart block requiring a permanent pacemaker, organ dysfunction, and death.  [name] understands and agrees to proceed.   ? ?Preparation: ? ?The patient was seen in the preoperative holding area and the correct patient, correct operation were confirmed with the patient after reviewing the medical record and catheterization. The consent was signed by me. Preoperative antibiotics were given. A pulmonary arterial line and radial arterial line were placed by the anesthesia team. The patient was taken back to the operating room and positioned supine on the operating room table. After being placed under general endotracheal anesthesia by the anesthesia team a foley catheter was placed. The neck, chest, abdomen, and both legs were prepped with betadine soap and solution and draped in the usual sterile manner. A surgical time-out was taken and the correct patient and operative procedure were confirmed with the nursing and anesthesia staff. ? ? ?Cardiopulmonary Bypass: ? ?A median sternotomy was performed. The pericardium was opened in the midline.  Right ventricular function appeared normal. The ascending  aorta was of normal size and had no palpable plaque. There were no contraindications to aortic cannulation or cross-clamping. The patient was fully systemically heparinized and the ACT was maintained > 400 sec. The proximal aortic arch was cannulated with a 20 F aortic cannula for arterial inflow. Venous cannulation was performed via the right atrial appendage using a two-staged venous cannula. An antegrade cardioplegia/vent cannula was inserted into the mid-ascending aorta. Aortic occlusion was performed with a single cross-clamp. Systemic cooling to 32 degrees Centigrade and topical cooling of the heart with iced saline were used. Cold retrograde KBC cardioplegia was used to induce diastolic arrest and was then given at about 60 minute intervals throughout the period of arrest to maintain myocardial temperature at or below 10 degrees centigrade. A temperature probe was inserted into the interventricular septum and an insulating pad was placed in the pericardium. ? ? ?Left internal mammary artery harvest: ? ?The left side of the sternum was retracted using the Rultract retractor. The left internal mammary artery was harvested as a pedicle graft. All side branches were clipped. It was a medium-sized vessel of good quality with excellent blood flow. It was ligated distally and divided. It was sprayed with topical papaverine solution to prevent vasospasm. ? ? ?Coronary arteries: ? ?The coronary arteries were examined. ? ?LAD:  large vessel with no distal disease ?LCX:  no disease on cath ?RCA:  no disease on cath ? ? ?Grafts: ? ?LIMA to the LAD: 2.5 mm. It was sewn end to side using 8-0 prolene continuous suture. ? ? ? ?Aortic Valve Replacement:  ? ?A transverse aortotomy was performed 1 cm above the take-off of the right coronary artery. The native valve was trileaflet with thickened and mildly calcified leaflets and mild annular calcification. The ostia of the coronary arteries were in normal position and were not  obstructed. The sinuses were very small. The native valve leaflets were excised and the annulus was decalcified with rongeurs. Care was taken to remove all particulate debris. The left ventricle was directly inspected for debris and then irrigated with ice saline solution. The annulus was sized and a size 19 mm Edwards INSPIRIS RESILIA pericardial valve was chosen. The model number was [id number] and the serial number was [id number]. Her BSA is only 1.6 so that size valve should be fine and would be suitable for a 23 mm S3 valve in valve TAVR in the future if needed. While the valve was being prepared 2-0 Ethibond pledgeted horizontal mattress sutures were placed around the annulus with the pledgets in a sub-annular position. The sutures were placed through the sewing ring and the valve lowered into place. The sutures were tied using CorKnots. The valve seated nicely and the coronary ostia were not obstructed. The prosthetic valve leaflets moved normally and there was no sub-valvular obstruction. The aortotomy was closed using 4-0 Prolene suture in 2 layers with felt strips to reinforce the closure.  ? ? ?Completion: ? ?The patient was rewarmed to 37 degrees Centigrade. The clamp was removed from the LIMA pedicle and there was rapid warming of the septum and return of ventricular fibrillation. The crossclamp was removed with a time of 110 minutes. There was spontaneous return of sinus rhythm. The distal and proximal anastomoses were checked for hemostasis. The position of the grafts was satisfactory. Two temporary epicardial pacing wires were placed on the right atrium and two on the right ventricle. The patient was weaned from CPB without  difficulty on no inotropes. CPB time was 131 minutes. Cardiac output was 5 LPM. LVEF was 70% with a mean gradient of 8 mm Hg across the aortic valve prosthesis. There was no paravalvular leak. Heparin was fully reversed with protamine and the aortic and venous cannulas removed.  Hemostasis was achieved. Mediastinal and left pleural drainage tubes were placed. The sternum was closed with  #6 stainless steel wires. The fascia was closed with continuous # 1 vicryl suture. The subcutaneous tis

## 2021-09-19 NOTE — Anesthesia Procedure Notes (Signed)
Arterial Line Insertion ?Start/End4/03/2022 7:10 AM ?Performed by: Josephine Igo, CRNA ? Patient location: Pre-op. ?Preanesthetic checklist: patient identified, IV checked, site marked, risks and benefits discussed, surgical consent, monitors and equipment checked, pre-op evaluation, timeout performed and anesthesia consent ?Lidocaine 1% used for infiltration ?Left, radial was placed ?Catheter size: 20 G ?Hand hygiene performed  and maximum sterile barriers used  ? ?Attempts: 1 ?Procedure performed without using ultrasound guided technique. ?Following insertion, dressing applied. ?Post procedure assessment: normal and unchanged ? ? ? ?

## 2021-09-19 NOTE — Discharge Summary (Addendum)
Physician Discharge Summary  ?Patient ID: ?Katie Buckley ?MRN: [id number] ?DOB/AGE: 1951-01-31 71 y.o. ? ?Admit date: 09/19/2021 ?Discharge date: 09/24/2021 ? ?Admission Diagnoses: ? ?Severe aortic stenosis ?Aortic insufficiency, moderate ?Single-vessel coronary artery disease ?Left ventricular hypertrophy with diastolic dysfunction ?Dyslipidemia ?Type 2 diabetes mellitus with neuropathy ? ?Discharge Diagnoses:  ? ?Severe aortic stenosis ?Aortic insufficiency, moderate ?Single-vessel coronary artery disease ?Left ventricular hypertrophy with diastolic dysfunction ?Dyslipidemia ?Type 2 diabetes mellitus with neuropathy ?S/P aortic valve replacement ?S/P single-vessel coronary bypass grafting ?Expected acute blood loss anemia ?Postoperative atrial fibrillation ? ?Discharged Condition: stable ? ?History of Present Illness: ? ?This 71 year old woman has stage D, severe, symptomatic low gradient, normal EF aortic stenosis with New York Heart Association class III symptoms of exertional fatigue and shortness of breath consistent with chronic diastolic congestive heart failure.  She is also having repeated episodes of dizziness and has had several falls.  I have personally reviewed her 2D echocardiogram, cardiac catheterization, and CTA studies and her echocardiogram shows a severely calcified and thickened aortic valve with a mean gradient that is only 18 mmHg with a valve area of 0.79 cm?.  There appears to be moderate aortic insufficiency.  Left ventricular systolic function is normal.  Cardiac catheterization shows a 70% mid LAD stenosis with a positive FFR of 0.7.  Aortic valve area was measured at 0.72 cm? with 2+ aortic insufficiency.  I agree that aortic valve replacement is indicated in this patient for relief of her symptoms and to prevent left ventricular deterioration.  Her gated cardiac CTA shows a relatively small aortic annular measurement of 321 mm? which would only allow insertion of a small 20 mm SAPIEN 3  TAVR valve.  Her aortic sinuses are small and are not suitable for a Medtronic Evolut valve.  Therefore I think the best option for her is open surgical aortic valve replacement using a bioprosthetic valve and coronary bypass to the LAD using a LIMA graft.  This will give her the best long-term result.  I reviewed all of the studies with the patient and her husband and daughter.  They are in agreement with proceeding with surgical aortic valve replacement and CABG. I discussed the operative procedure with the patient and family including alternatives, benefits and risks; including but not limited to bleeding, blood transfusion, infection, stroke, myocardial infarction, graft failure, heart block requiring a permanent pacemaker, organ dysfunction, and death.  Katie Buckley understands and agrees to proceed.   ?  ? ?Hospital Course: ?As dictated by Enid Cutter PA-C: ?Katie Buckley was admitted for elective surgery on 09/19/2021.  She was taken to the operating where single-vessel coronary bypass grafting was accomplished utilizing left internal mammary artery to the left anterior descending coronary artery.  Aortic valve replacement was also accomplished with a 19 mm Edwards Inspiris Resilia pericardial tissue valve.  She was transfused with 3 units packed red cells intraoperatively.  At the conclusion of the procedure, she separated from cardiopulmonary bypass without requiring any inotropic support.  She was transferred to the surgical ICU in stable condition.  Vital signs and hemodynamics remained stable.  On the first attempt to wean the ventilator, she was hypercarbic with the PCO2 of 53.  She was placed back on full support.  On the next attempt about an hour later, she was fully awake and had adequate respiratory mechanics.  She was extubated to 2 L nasal cannula.  On postop day 1, she was in sinus rhythm and was mildly hypertensive.  She was  started back on her Avapro.  The insulin drip and glucose  stabilizer was transitioned to sliding scale insulin and Levemir.  The chest tubes and monitoring lines were removed.  She was started on diuretics for a 13-pound volume excess.  On postop day 3, she developed atrial fibrillation RVR.  Ventricular rate ranged from 130s to 150s.  She was started on amiodarone per protocol with IV bolus and infusion.  Within a few hours, she had converted back to stable sinus rhythm.  We will continue the infusions overnight and on postop day 4, she was transitioned to oral amiodarone.  The pacer wires were removed at that point as she was in stable sinus rhythm with satisfactory rate.  She progressed to independence with her mobility.  She was tolerating adequate oral intake and having appropriate bowel and bladder function at the time of discharge. ? ?Addendum: ?She continues to maintain sinus rhythm on Amiodarone 200 mg bid and Lopressor 50 mg bid. SHe is also on Amlodipine 10 mg daily and Irbesartan 75 mg daily for blood pressure control. Chest x ray done today showed small, new right pleural effusion. As discussed with surgeon, will give Lasix 40 mg daily with potassium supplement and continue for 5 days. She will need to follow up with her medical doctor after discharge regarding further diabetes management and surveillance of HGA1C 8. Her chest wound is clean, dry, and healing without sign of infection. She has been ambulating on room air with good oxygenation. As discussed with surgeon, she is felt surgically stable for discharge today. ? ?Consults: None ? ?Significant Diagnostic Studies:  ? ?CLINICAL DATA:  Status post AVR ?  ?EXAM: ?CHEST - 2 VIEW ?  ?COMPARISON:  Chest x-ray dated September 21, 2021 ?  ?FINDINGS: ?Cardiac and mediastinal contours are unchanged post median ?sternotomy and aortic valve replacement. Unchanged small left ?pleural effusion and atelectasis. No pneumothorax. ?  ?IMPRESSION: ?Small left pleural effusion and atelectasis. ?  ?  ?Electronically Signed ?   By: Yetta Glassman M.D. ?  On: 09/22/2021 07:54 ? ?Treatments: Surgery ? ?CARDIOVASCULAR SURGERY OPERATIVE NOTE ?  ?09/19/2021 ?  ?Surgeon:  Gaye Pollack, MD ?  ?First Assistant: Enid Cutter,  PA-C ?  ? ?Preoperative Diagnosis:  Severe multi-vessel coronary artery disease ?  ?Postoperative Diagnosis:  Same ?  ?  ?Procedure: ?  ?Median Sternotomy ?Extracorporeal circulation ?3.   Coronary artery bypass grafting x 1 ?  ?Left internal mammary artery graft to the LAD ?  ?  ?  ?4.   Aortic valve replacement using a 19 mm Edwards INSPIRIS RESILIA pericardial valve. BSA 1.6 m2 ?  ?  ?Anesthesia:  General Endotracheal ?  ?  ?Clinical History/Surgical Indication: ?  ?This 71 year old woman has stage D, severe, symptomatic low gradient, normal EF aortic stenosis with New York Heart Association class III symptoms of exertional fatigue and shortness of breath consistent with chronic diastolic congestive heart failure.  She is also having repeated episodes of dizziness and has had several falls.  I have personally reviewed her 2D echocardiogram, cardiac catheterization, and CTA studies and her echocardiogram shows a severely calcified and thickened aortic valve with a mean gradient that is only 18 mmHg with a valve area of 0.79 cm?.  There appears to be moderate aortic insufficiency.  Left ventricular systolic function is normal.  Cardiac catheterization shows a 70% mid LAD stenosis with a positive FFR of 0.7.  Aortic valve area was measured at 0.72 cm? with 2+ aortic  insufficiency.  I agree that aortic valve replacement is indicated in this patient for relief of her symptoms and to prevent left ventricular deterioration.  Her gated cardiac CTA shows a relatively small aortic annular measurement of 321 mm? which would only allow insertion of a small 20 mm SAPIEN 3 TAVR valve.  Her aortic sinuses are small and are not suitable for a Medtronic Evolut valve.  Therefore I think the best option for her is open surgical  aortic valve replacement using a bioprosthetic valve and coronary bypass to the LAD using a LIMA graft.  This will give her the best long-term result.  I reviewed all of the studies with the patient and her hu

## 2021-09-19 NOTE — Brief Op Note (Signed)
09/19/2021 ? ?12:07 PM ? ?PATIENT:  Katie Buckley  71 y.o. female ? ?PRE-OPERATIVE DIAGNOSIS:  Severe Aortic Stenosis with aortic insufficiency, Coronary Artery Disease ? ?POST-OPERATIVE DIAGNOSIS:  Severe Aortic Stenosis with aortic insufficiency, Coronary Artery Disease ? ?PROCEDURES:   ?CORONARY ARTERY BYPASS GRAFTING X1 With Left Internal Mammary Artery to the Left anterior Descending Coronary Artery ? ?AORTIC VALVE REPLACEMENT Using 64m Edwards Resilia Inspiris Valve  ? ?TRANSESOPHAGEAL ECHOCARDIOGRAM (TEE) (N/A) ? ?SURGEON:  BGaye Pollack MD - Primary ? ?PHYSICIAN ASSISTANT: Yazmyn Valbuena ? ?ASSISTANTS: WDineen Kid RN, RN First Assistant  ? ?ANESTHESIA:   general ? ?EBL:  2055m ? ?BLOOD ADMINISTERED: PRBC's ? ?DRAINS:  Mediastinal and left pleural tubes   ? ?LOCAL MEDICATIONS USED:  NONE ? ?SPECIMEN:  No Specimen ? ?DISPOSITION OF SPECIMEN:  N/A ? ?COUNTS:  Correct ? ?DICTATION: .Dragon Dictation ? ?PLAN OF CARE: Admit to inpatient  ? ?PATIENT DISPOSITION:  ICU - intubated and hemodynamically stable. ?  ?Delay start of Pharmacological VTE agent (>24hrs) due to surgical blood loss or risk of bleeding: yes ? ?

## 2021-09-19 NOTE — Anesthesia Procedure Notes (Signed)
Central Venous Catheter Insertion ?Performed by: Annye Asa, MD, anesthesiologist ?Start/End4/03/2022 6:43 AM, 09/19/2021 6:59 AM ?Patient location: OR. ?Preanesthetic checklist: patient identified, IV checked, risks and benefits discussed, surgical consent, monitors and equipment checked, pre-op evaluation, timeout performed and anesthesia consent ?Position: supine ?Lidocaine 1% used for infiltration and patient sedated ?Hand hygiene performed , maximum sterile barriers used  and Seldinger technique used ?Catheter size: 8.5 Fr ?Central line was placed.Sheath introducer ?Swan type:thermodilution ?Procedure performed using ultrasound guided technique. ?Ultrasound Notes:anatomy identified, needle tip was noted to be adjacent to the nerve/plexus identified, no ultrasound evidence of intravascular and/or intraneural injection and image(s) printed for medical record ?Attempts: 1 ?Following insertion, line sutured, dressing applied and Biopatch. ?Post procedure assessment: free fluid flow, blood return through all ports and no air ? ?Patient tolerated the procedure well with no immediate complications. ?Additional procedure comments: PA catheter:  Routine monitors. Timeout, sterile prep, drape, FBP R neck. Supine position.  1% Lido local, finder and trocar RIJ 1st pass with US guidance.  Cordis placed over J wire. PA catheter in easily.  Sterile dressing applied.  Patient tolerated well, VSS.  Jenita Seashore, MD . ? ? ? ? ? ?

## 2021-09-20 ENCOUNTER — Encounter (HOSPITAL_COMMUNITY): Payer: Self-pay | Admitting: Surgery

## 2021-09-20 ENCOUNTER — Encounter: Payer: PPO | Admitting: Physical Therapy

## 2021-09-20 ENCOUNTER — Inpatient Hospital Stay (HOSPITAL_COMMUNITY): Payer: PPO

## 2021-09-20 LAB — CBC
HCT: 34.7 % — ABNORMAL LOW (ref 36.0–46.0)
HCT: 35.8 % — ABNORMAL LOW (ref 36.0–46.0)
Hemoglobin: 11.5 g/dL — ABNORMAL LOW (ref 12.0–15.0)
Hemoglobin: 12 g/dL (ref 12.0–15.0)
MCH: 26.7 pg (ref 26.0–34.0)
MCH: 27.1 pg (ref 26.0–34.0)
MCHC: 33.1 g/dL (ref 30.0–36.0)
MCHC: 33.5 g/dL (ref 30.0–36.0)
MCV: 80.7 fL (ref 80.0–100.0)
MCV: 80.8 fL (ref 80.0–100.0)
Platelets: 109 10*3/uL — ABNORMAL LOW (ref 150–400)
Platelets: 119 10*3/uL — ABNORMAL LOW (ref 150–400)
RBC: 4.3 MIL/uL (ref 3.87–5.11)
RBC: 4.43 MIL/uL (ref 3.87–5.11)
RDW: 15.9 % — ABNORMAL HIGH (ref 11.5–15.5)
RDW: 16.1 % — ABNORMAL HIGH (ref 11.5–15.5)
WBC: 17 10*3/uL — ABNORMAL HIGH (ref 4.0–10.5)
WBC: 17.9 10*3/uL — ABNORMAL HIGH (ref 4.0–10.5)
nRBC: 0 % (ref 0.0–0.2)
nRBC: 0 % (ref 0.0–0.2)

## 2021-09-20 LAB — BASIC METABOLIC PANEL
Anion gap: 4 — ABNORMAL LOW (ref 5–15)
Anion gap: 7 (ref 5–15)
BUN: 12 mg/dL (ref 8–23)
BUN: 13 mg/dL (ref 8–23)
CO2: 26 mmol/L (ref 22–32)
CO2: 24 mmol/L (ref 22–32)
Calcium: 8 mg/dL — ABNORMAL LOW (ref 8.9–10.3)
Calcium: 8.2 mg/dL — ABNORMAL LOW (ref 8.9–10.3)
Chloride: 111 mmol/L (ref 98–111)
Chloride: 104 mmol/L (ref 98–111)
Creatinine, Ser: 0.67 mg/dL (ref 0.44–1.00)
Creatinine, Ser: 0.68 mg/dL (ref 0.44–1.00)
GFR, Estimated: >60 mL/min (ref >60)
GFR, Estimated: >60 mL/min (ref >60)
Glucose, Bld: 130 mg/dL — ABNORMAL HIGH (ref 70–99)
Glucose, Bld: 188 mg/dL — ABNORMAL HIGH (ref 70–99)
Potassium: 4.1 mmol/L (ref 3.5–5.1)
Potassium: 4.3 mmol/L (ref 3.5–5.1)
Sodium: 141 mmol/L (ref 135–145)
Sodium: 135 mmol/L (ref 135–145)

## 2021-09-20 LAB — POCT I-STAT 7, (LYTES, BLD GAS, ICA,H+H)
Acid-base deficit: 1 mmol/L (ref 0.0–2.0)
Bicarbonate: 23.6 mmol/L (ref 20.0–28.0)
Calcium, Ion: 1.25 mmol/L (ref 1.15–1.40)
HCT: 33 % — ABNORMAL LOW (ref 36.0–46.0)
Hemoglobin: 11.2 g/dL — ABNORMAL LOW (ref 12.0–15.0)
O2 Saturation: 94 %
Patient temperature: 36.9
Potassium: 3.2 mmol/L — ABNORMAL LOW (ref 3.5–5.1)
Sodium: 142 mmol/L (ref 135–145)
TCO2: 25 mmol/L (ref 22–32)
pCO2 arterial: 37.5 mmHg (ref 32–48)
pH, Arterial: 7.406 (ref 7.35–7.45)
pO2, Arterial: 69 mmHg — ABNORMAL LOW (ref 83–108)

## 2021-09-20 LAB — GLUCOSE, CAPILLARY
Glucose-Capillary: 129 mg/dL — ABNORMAL HIGH (ref 70–99)
Glucose-Capillary: 140 mg/dL — ABNORMAL HIGH (ref 70–99)
Glucose-Capillary: 155 mg/dL — ABNORMAL HIGH (ref 70–99)
Glucose-Capillary: 137 mg/dL — ABNORMAL HIGH (ref 70–99)
Glucose-Capillary: 133 mg/dL — ABNORMAL HIGH (ref 70–99)
Glucose-Capillary: 132 mg/dL — ABNORMAL HIGH (ref 70–99)
Glucose-Capillary: 135 mg/dL — ABNORMAL HIGH (ref 70–99)
Glucose-Capillary: 169 mg/dL — ABNORMAL HIGH (ref 70–99)
Glucose-Capillary: 209 mg/dL — ABNORMAL HIGH (ref 70–99)
Glucose-Capillary: 198 mg/dL — ABNORMAL HIGH (ref 70–99)
Glucose-Capillary: 168 mg/dL — ABNORMAL HIGH (ref 70–99)
Glucose-Capillary: 152 mg/dL — ABNORMAL HIGH (ref 70–99)

## 2021-09-20 LAB — MAGNESIUM
Magnesium: 2.4 mg/dL (ref 1.7–2.4)
Magnesium: 2.2 mg/dL (ref 1.7–2.4)

## 2021-09-20 LAB — SURGICAL PATHOLOGY

## 2021-09-20 MED ORDER — INSULIN ASPART 100 UNIT/ML IJ SOLN
0.0000 [IU] | INTRAMUSCULAR | Status: DC
  Administered 2021-09-20: 4 [IU] via SUBCUTANEOUS
  Administered 2021-09-20: 8 [IU] via SUBCUTANEOUS
  Administered 2021-09-20: 2 [IU] via SUBCUTANEOUS
  Administered 2021-09-20: 4 [IU] via SUBCUTANEOUS
  Administered 2021-09-21 (×2): 2 [IU] via SUBCUTANEOUS

## 2021-09-20 MED ORDER — TRAMADOL HCL 50 MG PO TABS
50.0000 mg | ORAL_TABLET | ORAL | Status: DC | PRN
  Administered 2021-09-20 – 2021-09-23 (×5): 50 mg via ORAL
  Filled 2021-09-20 (×5): qty 1

## 2021-09-20 MED ORDER — ORAL CARE MOUTH RINSE
15.0000 mL | Freq: Two times a day (BID) | OROMUCOSAL | Status: DC
  Administered 2021-09-20 – 2021-09-21 (×5): 15 mL via OROMUCOSAL

## 2021-09-20 MED ORDER — SODIUM CHLORIDE 0.9% FLUSH: 10.0000 mL | INTRAVENOUS | Status: DC | PRN

## 2021-09-20 MED ORDER — METOPROLOL TARTRATE 25 MG/10 ML ORAL SUSPENSION: 25.0000 mg | Freq: Two times a day (BID) | ORAL | Status: DC

## 2021-09-20 MED ORDER — INSULIN ASPART 100 UNIT/ML IJ SOLN: 0.0000 [IU] | INTRAMUSCULAR | Status: DC

## 2021-09-20 MED ORDER — INSULIN DETEMIR 100 UNIT/ML ~~LOC~~ SOLN
30.0000 [IU] | Freq: Every day | SUBCUTANEOUS | Status: AC
  Administered 2021-09-20: 30 [IU] via SUBCUTANEOUS
  Filled 2021-09-20: qty 0.3

## 2021-09-20 MED ORDER — METOPROLOL TARTRATE 25 MG PO TABS
25.0000 mg | ORAL_TABLET | Freq: Two times a day (BID) | ORAL | Status: DC
Start: 2021-09-20 — End: 2021-09-21
  Administered 2021-09-20 (×2): 25 mg via ORAL
  Filled 2021-09-20 (×2): qty 1

## 2021-09-20 MED ORDER — POTASSIUM CHLORIDE CRYS ER 20 MEQ PO TBCR
20.0000 meq | EXTENDED_RELEASE_TABLET | Freq: Two times a day (BID) | ORAL | Status: AC
  Administered 2021-09-20 (×2): 20 meq via ORAL
  Filled 2021-09-20 (×2): qty 1

## 2021-09-20 MED ORDER — INSULIN DETEMIR 100 UNIT/ML ~~LOC~~ SOLN
25.0000 [IU] | Freq: Every day | SUBCUTANEOUS | Status: DC
  Administered 2021-09-21: 25 [IU] via SUBCUTANEOUS
  Filled 2021-09-20 (×2): qty 0.25

## 2021-09-20 MED ORDER — FUROSEMIDE 10 MG/ML IJ SOLN
40.0000 mg | Freq: Two times a day (BID) | INTRAMUSCULAR | Status: AC
  Administered 2021-09-20 (×2): 40 mg via INTRAVENOUS
  Filled 2021-09-20 (×2): qty 4

## 2021-09-20 MED ORDER — INSULIN DETEMIR 100 UNIT/ML ~~LOC~~ SOLN
30.0000 [IU] | Freq: Every day | SUBCUTANEOUS | Status: DC
  Filled 2021-09-20: qty 0.3

## 2021-09-20 MED ORDER — IRBESARTAN 150 MG PO TABS
75.0000 mg | ORAL_TABLET | Freq: Every day | ORAL | Status: DC
  Administered 2021-09-20 – 2021-09-24 (×5): 75 mg via ORAL
  Filled 2021-09-20 (×5): qty 1

## 2021-09-20 MED ORDER — SODIUM CHLORIDE 0.9% FLUSH
10.0000 mL | Freq: Two times a day (BID) | INTRAVENOUS | Status: DC
  Administered 2021-09-20 – 2021-09-21 (×3): 10 mL

## 2021-09-20 MED FILL — Thrombin (Recombinant) For Soln 20000 Unit: CUTANEOUS | Qty: 1 | Status: AC

## 2021-09-20 NOTE — Progress Notes (Signed)
CT surgery PM rounds ? ?Patient resting comfortably after dinner ?Normal sinus rhythm ?P.m. labs reviewed are satisfactory ?Excellent urine output this evening ? ?Blood pressure (!) 152/63, pulse 87, temperature 98.8 ?F (37.1 ?C), resp. rate (!) 23, height 5' (1.524 m), weight 69.8 kg, SpO2 96 %.  ?

## 2021-09-20 NOTE — TOC Progression Note (Signed)
Transition of Care (TOC) - Progression Note  ? ? ?Patient Details  ?Name: Katie Buckley ?MRN: [id number] ?Date of Birth: July 12, 1950 ? ?Transition of Care (TOC) CM/SW Contact  ?Angelita Ingles, RN ?Phone Number:[phone] ? ?09/20/2021, 3:49 PM ? ?Clinical Narrative:    ? ?Transition of Care (TOC) Screening Note ? ? ?Patient Details  ?Name: Katie Buckley ?Date of Birth: 1950/11/13 ? ? ?Transition of Care (TOC) CM/SW Contact:    ?Angelita Ingles, RN ?Phone Number: ?09/20/2021, 3:49 PM ? ? ? ?Transition of Care Department [facility]) has reviewed patient and no TOC needs have been identified at this time. We will continue to monitor patient advancement through interdisciplinary progression rounds.  ?  ? ? ?  ?  ? ?Expected Discharge Plan and Services ?  ?  ?  ?  ?  ?                ?  ?  ?  ?  ?  ?  ?  ?  ?  ?  ? ? ?Social Determinants of Health (SDOH) Interventions ?  ? ?Readmission Risk Interventions ?   ? View : No data to display.  ?  ?  ?  ? ? ?

## 2021-09-20 NOTE — Anesthesia Postprocedure Evaluation (Signed)
Anesthesia Post Note ? ?Patient: Katie Buckley ? ?Procedure(s) Performed: CORONARY ARTERY BYPASS GRAFTING X1 Using Left Internal Mammory artery (Chest) ?AORTIC VALVE REPLACEMENT Using 53m Edwards Resilia Inspiris Valve (Chest) ?TRANSESOPHAGEAL ECHOCARDIOGRAM (TEE) ? ?  ? ?Patient location during evaluation: SICU ?Anesthesia Type: General ?Level of consciousness: awake and alert, patient cooperative and oriented ?Pain management: pain level controlled ?Vital Signs Assessment: post-procedure vital signs reviewed and stable ?Respiratory status: nonlabored ventilation, spontaneous breathing and respiratory function stable ?Cardiovascular status: blood pressure returned to baseline and stable ?Postop Assessment: no apparent nausea or vomiting, adequate PO intake and able to ambulate ?Anesthetic complications: no ? ? ?No notable events documented. ? ?Last Vitals:  ?Vitals:  ? 09/20/21 1130 09/20/21 1200  ?BP: (!) 107/93 136/71  ?Pulse: 86 86  ?Resp: (!) 21 (!) 24  ?Temp:    ?SpO2: 97% 97%  ?  ?Last Pain:  ?Vitals:  ? 09/20/21 0933  ?TempSrc:   ?PainSc: 4   ? ? ?  ?  ?  ?  ?  ?  ? ?Daanya Lanphier,E. Murial Beam ? ? ? ? ?

## 2021-09-20 NOTE — Progress Notes (Signed)
1 Day Post-Op Procedure(s) (LRB): ?CORONARY ARTERY BYPASS GRAFTING X1 Using Left Internal Mammory artery (N/A) ?AORTIC VALVE REPLACEMENT Using 38m Edwards Resilia Inspiris Valve (N/A) ?TRANSESOPHAGEAL ECHOCARDIOGRAM (TEE) (N/A) ?Subjective: ?No specific complaints. Feels like a truck ran over her. ? ?Objective: ?Vital signs in last 24 hours: ?Temp:  [98.4 ?F (36.9 ?C)-99.7 ?F (37.6 ?C)] 98.8 ?F (37.1 ?C) (04/11 0530) ?Pulse Rate:  [83-104] 94 (04/11 0600) ?Cardiac Rhythm: Normal sinus rhythm;Sinus tachycardia (04/11 0400) ?Resp:  [0-30] 21 (04/11 0600) ?BP: (94-140)/(60-77) 140/77 (04/11 0600) ?SpO2:  [94 %-100 %] 97 % (04/11 0600) ?Arterial Line BP: (112-182)/(51-73) 175/62 (04/11 0600) ?FiO2 (%):  [40 %-50 %] 40 % (04/10 2000) ?Weight:  [69.8 kg] 69.8 kg (04/11 0600) ? ?Hemodynamic parameters for last 24 hours: ?PAP: (14-44)/(5-30) 30/15 ?CO:  [2.4 L/min-5.3 L/min] 4.3 L/min ?CI:  [1.5 L/min/m2-3.3 L/min/m2] 2.7 L/min/m2 ? ?Intake/Output from previous day: ?04/10 0701 - 04/11 0700 ?In: 4998.8 [I.V.:3329.9; Blood:690; IV Piggyback:978.9] ?Out: 40630[Urine:2307; Emesis/NG output:150; Blood:1600; Chest Tube:590] ?Intake/Output this shift: ?Total I/O ?In: 757.9 [I.V.:408; IV Piggyback:349.9] ?Out: 860 [Urine:560; Chest Tube:300] ? ?General appearance: alert and cooperative ?Neurologic: intact ?Heart: regular rate and rhythm, S1, S2 normal, no murmur ?Lungs: clear to auscultation bilaterally ?Extremities: edema mild ?Wound: dressing dry ? ?Lab Results: ?Recent Labs  ?  09/19/21 ?2046 09/19/21 ?2131 09/19/21 ?2330 09/20/21 ?01601 ?WBC 17.3*  --   --  17.0*  ?HGB 12.2   < > 11.6* 11.5*  ?HCT 35.7*   < > 34.0* 34.7*  ?PLT 113*  --   --  109*  ? < > = values in this interval not displayed.  ? ?BMET:  ?Recent Labs  ?  09/19/21 ?2046 09/19/21 ?2131 09/19/21 ?2330 09/20/21 ?00932 ?NA 139   < > 141 141  ?K 4.2   < > 4.2 4.1  ?CL 110  --   --  111  ?CO2 24  --   --  26  ?GLUCOSE 134*  --   --  130*  ?BUN 11  --   --  12   ?CREATININE 0.65  --   --  0.67  ?CALCIUM 7.7*  --   --  8.0*  ? < > = values in this interval not displayed.  ?  ?PT/INR:  ?Recent Labs  ?  09/19/21 ?1336  ?LABPROT 18.4*  ?INR 1.6*  ? ?ABG ?   ?Component Value Date/Time  ? PHART 7.303 (L) 09/19/2021 2330  ? HCO3 23.8 09/19/2021 2330  ? TCO2 25 09/19/2021 2330  ? ACIDBASEDEF 3.0 (H) 09/19/2021 2330  ? O2SAT 97 09/19/2021 2330  ? ?CBG (last 3)  ?Recent Labs  ?  09/20/21 ?0316 09/20/21 ?0414 09/20/21 ?03557 ?GLUCAP 133* 132* 135*  ? ?CXR: ok ? ?ECG pending ? ?Assessment/Plan: ?S/P Procedure(s) (LRB): ?CORONARY ARTERY BYPASS GRAFTING X1 Using Left Internal Mammory artery (N/A) ?AORTIC VALVE REPLACEMENT Using 125mEdwards Resilia Inspiris Valve (N/A) ?TRANSESOPHAGEAL ECHOCARDIOGRAM (TEE) (N/A) ? ?POD 1 ?Hemodynamically stable on NTG for HTN.  Sinus rhythm 95. Increase Lopressor to 25 bid. Resume Avapro. ? ?DM: transition to Levemr and SSI. ? ?Dangle again and DC  chest tubes, swan, arterial line. ? ?Volume excess: wt is 13 lbs over preop. Start diuresis. ? ?IS, OOB, ambulate. ? ? LOS: 1 day  ? ? ?BrGaye Pollack4/04/2022 ? ? ?

## 2021-09-20 NOTE — TOC Progression Note (Signed)
Transition of Care (TOC) - Progression Note  ? ? ?Patient Details  ?Name: [place] ?MRN: [id number] ?Date of Birth: 1950/10/07 ? ?Transition of Care (TOC) CM/SW Contact  ?Angelita Ingles, RN ?Phone Number:[phone] ? ?09/20/2021, 3:46 PM ? ?Clinical Narrative:    ? ?Transition of Care (TOC) Screening Note ? ? ?Patient Details  ?Name: [place] ?Date of Birth: 06-09-1951 ? ? ?Transition of Care (TOC) CM/SW Contact:    ?Angelita Ingles, RN ?Phone Number: ?09/20/2021, 3:47 PM ? ? ? ?Transition of Care Department [facility]) has reviewed patient and no TOC needs have been identified at this time. We will continue to monitor patient advancement through interdisciplinary progression rounds.  ? ? ? ? ?  ?  ? ?Expected Discharge Plan and Services ?  ?  ?  ?  ?  ?                ?  ?  ?  ?  ?  ?  ?  ?  ?  ?  ? ? ?Social Determinants of Health (SDOH) Interventions ?  ? ?Readmission Risk Interventions ?   ? View : No data to display.  ?  ?  ?  ? ? ?

## 2021-09-21 ENCOUNTER — Inpatient Hospital Stay (HOSPITAL_COMMUNITY): Payer: PPO

## 2021-09-21 LAB — GLUCOSE, CAPILLARY
Glucose-Capillary: 124 mg/dL — ABNORMAL HIGH (ref 70–99)
Glucose-Capillary: 159 mg/dL — ABNORMAL HIGH (ref 70–99)
Glucose-Capillary: 194 mg/dL — ABNORMAL HIGH (ref 70–99)
Glucose-Capillary: 118 mg/dL — ABNORMAL HIGH (ref 70–99)
Glucose-Capillary: 215 mg/dL — ABNORMAL HIGH (ref 70–99)

## 2021-09-21 LAB — TYPE AND SCREEN
ABO/RH(D): O POS
Antibody Screen: NEGATIVE
Unit division: 0
Unit division: 0
Unit division: 0
Unit division: 0
Unit division: 0
Unit division: 0

## 2021-09-21 LAB — BASIC METABOLIC PANEL
Anion gap: 4 — ABNORMAL LOW (ref 5–15)
BUN: 13 mg/dL (ref 8–23)
CO2: 29 mmol/L (ref 22–32)
Calcium: 8.2 mg/dL — ABNORMAL LOW (ref 8.9–10.3)
Chloride: 101 mmol/L (ref 98–111)
Creatinine, Ser: 0.64 mg/dL (ref 0.44–1.00)
GFR, Estimated: >60 mL/min (ref >60)
Glucose, Bld: 104 mg/dL — ABNORMAL HIGH (ref 70–99)
Potassium: 3.9 mmol/L (ref 3.5–5.1)
Sodium: 134 mmol/L — ABNORMAL LOW (ref 135–145)

## 2021-09-21 LAB — CBC
HCT: 33.4 % — ABNORMAL LOW (ref 36.0–46.0)
Hemoglobin: 11.2 g/dL — ABNORMAL LOW (ref 12.0–15.0)
MCH: 27.1 pg (ref 26.0–34.0)
MCHC: 33.5 g/dL (ref 30.0–36.0)
MCV: 80.7 fL (ref 80.0–100.0)
Platelets: 111 10*3/uL — ABNORMAL LOW (ref 150–400)
RBC: 4.14 MIL/uL (ref 3.87–5.11)
RDW: 16.1 % — ABNORMAL HIGH (ref 11.5–15.5)
WBC: 14.4 10*3/uL — ABNORMAL HIGH (ref 4.0–10.5)
nRBC: 0 % (ref 0.0–0.2)

## 2021-09-21 LAB — BPAM RBC
Blood Product Expiration Date: 202304282359
Blood Product Expiration Date: 202305062359
Blood Product Expiration Date: 202305062359
Blood Product Expiration Date: 202305072359
Blood Product Expiration Date: 202305072359
Blood Product Expiration Date: 202305082359
ISSUE DATE / TIME: 202304082110
ISSUE DATE / TIME: 202304100819
ISSUE DATE / TIME: 202304100819
ISSUE DATE / TIME: 202304100943
ISSUE DATE / TIME: 202304100943
Unit Type and Rh: 5100
Unit Type and Rh: 5100
Unit Type and Rh: 5100
Unit Type and Rh: 5100
Unit Type and Rh: 5100
Unit Type and Rh: 5100

## 2021-09-21 LAB — LIPID PANEL
Cholesterol: 87 mg/dL (ref 0–200)
HDL: 42 mg/dL (ref >40)
LDL Cholesterol: 21 mg/dL (ref 0–99)
Total CHOL/HDL Ratio: 2.1 RATIO
Triglycerides: 122 mg/dL (ref <150)
VLDL: 24 mg/dL (ref 0–40)

## 2021-09-21 MED ORDER — FUROSEMIDE 10 MG/ML IJ SOLN
40.0000 mg | Freq: Every day | INTRAMUSCULAR | Status: AC
  Administered 2021-09-21 – 2021-09-22 (×2): 40 mg via INTRAVENOUS
  Filled 2021-09-21 (×2): qty 4

## 2021-09-21 MED ORDER — HYDRALAZINE HCL 20 MG/ML IJ SOLN
10.0000 mg | INTRAMUSCULAR | Status: DC | PRN
Start: 2021-09-21 — End: 2021-09-24
  Administered 2021-09-21 – 2021-09-23 (×2): 10 mg via INTRAVENOUS
  Filled 2021-09-21 (×2): qty 1

## 2021-09-21 MED ORDER — TRAZODONE HCL 50 MG PO TABS
50.0000 mg | ORAL_TABLET | Freq: Every day | ORAL | Status: DC
  Administered 2021-09-21 – 2021-09-23 (×3): 50 mg via ORAL
  Filled 2021-09-21 (×3): qty 1

## 2021-09-21 MED ORDER — INSULIN ASPART 100 UNIT/ML IJ SOLN
0.0000 [IU] | Freq: Three times a day (TID) | INTRAMUSCULAR | Status: DC
  Administered 2021-09-21: 8 [IU] via SUBCUTANEOUS
  Administered 2021-09-21: 4 [IU] via SUBCUTANEOUS
  Administered 2021-09-22: 2 [IU] via SUBCUTANEOUS
  Administered 2021-09-22: 4 [IU] via SUBCUTANEOUS
  Administered 2021-09-22: 8 [IU] via SUBCUTANEOUS
  Administered 2021-09-23: 4 [IU] via SUBCUTANEOUS
  Administered 2021-09-23: 8 [IU] via SUBCUTANEOUS

## 2021-09-21 MED ORDER — METOPROLOL TARTRATE 25 MG PO TABS
37.5000 mg | ORAL_TABLET | Freq: Two times a day (BID) | ORAL | Status: DC
  Administered 2021-09-21 (×2): 37.5 mg via ORAL
  Filled 2021-09-21 (×2): qty 1

## 2021-09-21 MED ORDER — SODIUM CHLORIDE 0.9% FLUSH
3.0000 mL | Freq: Two times a day (BID) | INTRAVENOUS | Status: DC
  Administered 2021-09-21 (×2): 3 mL via INTRAVENOUS

## 2021-09-21 MED ORDER — SODIUM CHLORIDE 0.9 % IV SOLN: 250.0000 mL | INTRAVENOUS | Status: DC | PRN

## 2021-09-21 MED ORDER — ~~LOC~~ CARDIAC SURGERY, PATIENT & FAMILY EDUCATION: Freq: Once | Status: AC

## 2021-09-21 MED ORDER — SODIUM CHLORIDE 0.9% FLUSH: 3.0000 mL | INTRAVENOUS | Status: DC | PRN

## 2021-09-21 MED ORDER — MAGNESIUM HYDROXIDE 400 MG/5ML PO SUSP: 30.0000 mL | Freq: Every day | ORAL | Status: DC | PRN

## 2021-09-21 MED ORDER — METOPROLOL TARTRATE 25 MG/10 ML ORAL SUSPENSION: 25.0000 mg | Freq: Two times a day (BID) | ORAL | Status: DC

## 2021-09-21 MED FILL — Heparin Sodium (Porcine) Inj 1000 Unit/ML: Qty: 1000 | Status: AC

## 2021-09-21 MED FILL — Sodium Chloride IV Soln 0.9%: INTRAVENOUS | Qty: 2000 | Status: AC

## 2021-09-21 MED FILL — Magnesium Sulfate Inj 50%: INTRAMUSCULAR | Qty: 10 | Status: AC

## 2021-09-21 MED FILL — Electrolyte-R (PH 7.4) Solution: INTRAVENOUS | Qty: 4000 | Status: AC

## 2021-09-21 MED FILL — Mannitol IV Soln 20%: INTRAVENOUS | Qty: 500 | Status: AC

## 2021-09-21 MED FILL — Heparin Sodium (Porcine) Inj 1000 Unit/ML: INTRAMUSCULAR | Qty: 10 | Status: AC

## 2021-09-21 MED FILL — Calcium Chloride Inj 10%: INTRAVENOUS | Qty: 10 | Status: AC

## 2021-09-21 MED FILL — Albumin, Human Inj 5%: INTRAVENOUS | Qty: 250 | Status: AC

## 2021-09-21 MED FILL — Sodium Bicarbonate IV Soln 8.4%: INTRAVENOUS | Qty: 50 | Status: AC

## 2021-09-21 MED FILL — Potassium Chloride Inj 2 mEq/ML: INTRAVENOUS | Qty: 20 | Status: AC

## 2021-09-21 NOTE — Progress Notes (Signed)
2 Days Post-Op Procedure(s) (LRB): ?CORONARY ARTERY BYPASS GRAFTING X1 Using Left Internal Mammory artery (N/A) ?AORTIC VALVE REPLACEMENT Using 56m Edwards Resilia Inspiris Valve (N/A) ?TRANSESOPHAGEAL ECHOCARDIOGRAM (TEE) (N/A) ?Subjective: ? Sinus tach with iv NTG for increased BP- will adjust meds ?Walking in hall ?Objective: ?Vital signs in last 24 hours: ?Temp:  [97.8 ?F (36.6 ?C)-98.9 ?F (37.2 ?C)] 98.9 ?F (37.2 ?C) (04/12 0747) ?Pulse Rate:  [78-96] 88 (04/12 0700) ?Cardiac Rhythm: Normal sinus rhythm (04/12 0740) ?Resp:  [8-27] 24 (04/12 0730) ?BP: (107-178)/(56-155) 156/65 (04/12 0730) ?SpO2:  [92 %-98 %] 94 % (04/12 0700) ? ?Hemodynamic parameters for last 24 hours: ?  ?stable ?Intake/Output from previous day: ?04/11 0701 - 04/12 0700 ?In: 1211.8 [P.O.:240; I.V.:671.7; IV Piggyback:300.1] ?Out: 2595 [Urine:2395; Chest Tube:200] ?Intake/Output this shift: ?No intake/output data recorded. ? ?  ?   Exam ? ?  General- alert and comfortable ?   Neck- no JVD, no cervical adenopathy palpable, no carotid bruit ?  Lungs- clear without rales, wheezes ?  Cor- regular rate and rhythm, no murmur , gallop ?  Abdomen- soft, non-tender ?  Extremities - warm, non-tender, minimal edema ?  Neuro- oriented, appropriate, no focal weakness ? ? ?Lab Results: ?Recent Labs  ?  09/20/21 ?1645 09/21/21 ?0410  ?WBC 17.9* 14.4*  ?HGB 12.0 11.2*  ?HCT 35.8* 33.4*  ?PLT 119* 111*  ? ?BMET:  ?Recent Labs  ?  09/20/21 ?1645 09/21/21 ?0410  ?NA 135 134*  ?K 4.3 3.9  ?CL 104 101  ?CO2 24 29  ?GLUCOSE 188* 104*  ?BUN 13 13  ?CREATININE 0.68 0.64  ?CALCIUM 8.2* 8.2*  ?  ?PT/INR:  ?Recent Labs  ?  09/19/21 ?1336  ?LABPROT 18.4*  ?INR 1.6*  ? ?ABG ?   ?Component Value Date/Time  ? PHART 7.303 (L) 09/19/2021 2330  ? HCO3 23.8 09/19/2021 2330  ? TCO2 25 09/19/2021 2330  ? ACIDBASEDEF 3.0 (H) 09/19/2021 2330  ? O2SAT 97 09/19/2021 2330  ? ?CBG (last 3)  ?Recent Labs  ?  09/20/21 ?2314 09/21/21 ?0960404/12/23 ?0745  ?GLUCAP 152* 124* 159*   ? ? ?Assessment/Plan: ?S/P Procedure(s) (LRB): ?CORONARY ARTERY BYPASS GRAFTING X1 Using Left Internal Mammory artery (N/A) ?AORTIC VALVE REPLACEMENT Using 122mEdwards Resilia Inspiris Valve (N/A) ?TRANSESOPHAGEAL ECHOCARDIOGRAM (TEE) (N/A) ?Diuresis ?Diabetes control ?Plan for transfer to step-down: see transfer orders ? ? LOS: 2 days  ? ? ?PeDahlia Byes4/05/2022 ?  ?

## 2021-09-21 NOTE — Progress Notes (Signed)
Pt admitted to rm 23 from [place] on nitro drip running at 9 ml/hr. CHG wipe given. Initiated tele. Oriented pt to the unit. VSS. Call bell within reach.  ? ?Lavenia Atlas, RN ? ?

## 2021-09-21 NOTE — Progress Notes (Signed)
Mobility Specialist Progress Note ? ? 09/21/21 1729  ?Mobility  ?Activity Ambulated with assistance to bathroom;Ambulated with assistance in hallway  ?Level of Assistance Minimal assist, patient does 75% or more  ?Assistive Device Front wheel walker  ?Distance Ambulated (ft) 22 ft  ?Activity Response Tolerated well  ?$Mobility charge 1 Mobility  ? ?Pre Mobility:  81 HR, 141/67 BP ?During Mobility: 90 HR, 90% SpO2 ?Post Mobility: 85 HR, 141/67 BP, 94% SpO2 ? ?Received pt in chair c/o migraines(8/10) but agreeable. Reiterated sternal precautions before starting, pt needing to use BR. Successful void and BM; upon washing hands pt had x1 LOB corrected w/ minA. Pt had no trouble while ambulating but towards the end pt c/o of LE fatigue. Returned back to chair w/o fault but once pt was settled started complaining of chest pain. Placed call bell in reach and then notified RN.   ? ?Holland Falling ?Mobility Specialist ?Phone Number [phone] ? ?

## 2021-09-22 ENCOUNTER — Other Ambulatory Visit: Payer: Self-pay

## 2021-09-22 ENCOUNTER — Inpatient Hospital Stay (HOSPITAL_COMMUNITY): Payer: PPO

## 2021-09-22 LAB — BASIC METABOLIC PANEL
Anion gap: 7 (ref 5–15)
BUN: 15 mg/dL (ref 8–23)
CO2: 29 mmol/L (ref 22–32)
Calcium: 8.6 mg/dL — ABNORMAL LOW (ref 8.9–10.3)
Chloride: 100 mmol/L (ref 98–111)
Creatinine, Ser: 0.65 mg/dL (ref 0.44–1.00)
GFR, Estimated: >60 mL/min (ref >60)
Glucose, Bld: 102 mg/dL — ABNORMAL HIGH (ref 70–99)
Potassium: 3.9 mmol/L (ref 3.5–5.1)
Sodium: 136 mmol/L (ref 135–145)

## 2021-09-22 LAB — GLUCOSE, CAPILLARY
Glucose-Capillary: 103 mg/dL — ABNORMAL HIGH (ref 70–99)
Glucose-Capillary: 225 mg/dL — ABNORMAL HIGH (ref 70–99)
Glucose-Capillary: 185 mg/dL — ABNORMAL HIGH (ref 70–99)
Glucose-Capillary: 135 mg/dL — ABNORMAL HIGH (ref 70–99)

## 2021-09-22 LAB — CBC
HCT: 34.2 % — ABNORMAL LOW (ref 36.0–46.0)
Hemoglobin: 11.4 g/dL — ABNORMAL LOW (ref 12.0–15.0)
MCH: 27 pg (ref 26.0–34.0)
MCHC: 33.3 g/dL (ref 30.0–36.0)
MCV: 81 fL (ref 80.0–100.0)
Platelets: 130 10*3/uL — ABNORMAL LOW (ref 150–400)
RBC: 4.22 MIL/uL (ref 3.87–5.11)
RDW: 15.9 % — ABNORMAL HIGH (ref 11.5–15.5)
WBC: 12.9 10*3/uL — ABNORMAL HIGH (ref 4.0–10.5)
nRBC: 0 % (ref 0.0–0.2)

## 2021-09-22 LAB — MAGNESIUM: Magnesium: 2 mg/dL (ref 1.7–2.4)

## 2021-09-22 MED ORDER — AMIODARONE HCL 200 MG PO TABS
400.0000 mg | ORAL_TABLET | Freq: Two times a day (BID) | ORAL | Status: DC
  Administered 2021-09-22: 400 mg via ORAL
  Filled 2021-09-22: qty 2

## 2021-09-22 MED ORDER — METOPROLOL TARTRATE 50 MG PO TABS
50.0000 mg | ORAL_TABLET | Freq: Two times a day (BID) | ORAL | Status: DC
Start: 2021-09-22 — End: 2021-09-24
  Administered 2021-09-22 – 2021-09-24 (×5): 50 mg via ORAL
  Filled 2021-09-22 (×5): qty 1

## 2021-09-22 MED ORDER — AMIODARONE HCL IN DEXTROSE 360-4.14 MG/200ML-% IV SOLN
30.0000 mg/h | INTRAVENOUS | Status: DC
  Administered 2021-09-22: 30 mg/h via INTRAVENOUS
  Filled 2021-09-22: qty 200

## 2021-09-22 MED ORDER — AMIODARONE IV BOLUS ONLY 150 MG/100ML
150.0000 mg | Freq: Once | INTRAVENOUS | Status: AC
  Administered 2021-09-22: 150 mg via INTRAVENOUS
  Filled 2021-09-22: qty 100

## 2021-09-22 MED ORDER — INSULIN DETEMIR 100 UNIT/ML ~~LOC~~ SOLN
15.0000 [IU] | Freq: Two times a day (BID) | SUBCUTANEOUS | Status: DC
  Administered 2021-09-22 – 2021-09-24 (×5): 15 [IU] via SUBCUTANEOUS
  Filled 2021-09-22 (×8): qty 0.15

## 2021-09-22 MED ORDER — ENOXAPARIN SODIUM 30 MG/0.3ML IJ SOSY
30.0000 mg | PREFILLED_SYRINGE | INTRAMUSCULAR | Status: DC
  Administered 2021-09-22 – 2021-09-24 (×3): 30 mg via SUBCUTANEOUS
  Filled 2021-09-22 (×3): qty 0.3

## 2021-09-22 MED ORDER — AMIODARONE HCL IN DEXTROSE 360-4.14 MG/200ML-% IV SOLN
60.0000 mg/h | INTRAVENOUS | Status: AC
  Administered 2021-09-22: 60 mg/h via INTRAVENOUS
  Filled 2021-09-22: qty 200

## 2021-09-22 MED ORDER — POTASSIUM CHLORIDE CRYS ER 20 MEQ PO TBCR
30.0000 meq | EXTENDED_RELEASE_TABLET | Freq: Two times a day (BID) | ORAL | Status: AC
  Administered 2021-09-22 (×2): 30 meq via ORAL
  Filled 2021-09-22 (×2): qty 1

## 2021-09-22 NOTE — Progress Notes (Signed)
? ?   ?  [address] ?      [place] [address] ?            [phone]   ? ?  ?Developed atrial fibrillation with RVR.  BP stable.  ?Loading with amiodarone IV.  ?K+ 3.9-> supplement with 48mq today. ?Mg++ 2.0 ? ?M. Beryl Hornberger PA-C ?

## 2021-09-22 NOTE — Progress Notes (Addendum)
? ?   ?[address] ?      [place] [address] ?            [phone]   ? ?  ?3 Days Post-Op Procedure(s) (LRB): ?CORONARY ARTERY BYPASS GRAFTING X1 Using Left Internal Mammory artery (N/A) ?AORTIC VALVE REPLACEMENT Using 67m Edwards Resilia Inspiris Valve (N/A) ?TRANSESOPHAGEAL ECHOCARDIOGRAM (TEE) (N/A) ?Subjective: ? ?Transferred from ICU to 4E yesterday on NTG drip for BP control- weaned off last evening.  ?Awake and alert. Tolerating PO's and had BM yesterday. Walked in the hall with assistance yesterday.  ? ?Objective: ?Vital signs in last 24 hours: ?Temp:  [98.4 ?F (36.9 ?C)-99.2 ?F (37.3 ?C)] 98.6 ?F (37 ?C) (04/13 0349) ?Pulse Rate:  [81-96] 90 (04/13 0349) ?Cardiac Rhythm: Normal sinus rhythm (04/12 2100) ?Resp:  [18-25] 19 (04/13 0349) ?BP: (121-166)/(53-78) 145/64 (04/13 0349) ?SpO2:  [90 %-97 %] 95 % (04/13 0349) ?Weight:  [67.2 kg] 67.2 kg (04/13 0349) ?  ? ?Intake/Output from previous day: ?04/12 0701 - 04/13 0700 ?In: 1100.3 [P.O.:300; I.V.:800.3] ?Out: 1400 [Urine:1400] ?Intake/Output this shift: ?No intake/output data recorded. ? ?General appearance: alert, cooperative, and no distress ?Neurologic: intact ?Heart: Regular rate and rhythm, monitor showing sinus rhythm in the 90s ?Lungs: Breath sounds are clear to auscultation.  She is maintaining excellent saturations on room air. ?Abdomen: Soft and nontender ?Wound: The sternotomy incision is intact healing with no sign of complication ? ?Lab Results: ?Recent Labs  ?  09/20/21 ?1983304/12/23 ?0410  ?WBC 17.9* 14.4*  ?HGB 12.0 11.2*  ?HCT 35.8* 33.4*  ?PLT 119* 111*  ? ?BMET:  ?Recent Labs  ?  09/20/21 ?1645 09/21/21 ?0410  ?NA 135 134*  ?K 4.3 3.9  ?CL 104 101  ?CO2 24 29  ?GLUCOSE 188* 104*  ?BUN 13 13  ?CREATININE 0.68 0.64  ?CALCIUM 8.2* 8.2*  ?  ?PT/INR:  ?Recent Labs  ?  09/19/21 ?1336  ?LABPROT 18.4*  ?INR 1.6*  ? ?ABG ?   ?Component Value Date/Time  ? PHART 7.303 (L) 09/19/2021 2330  ? HCO3 23.8 09/19/2021 2330  ? TCO2 25  09/19/2021 2330  ? ACIDBASEDEF 3.0 (H) 09/19/2021 2330  ? O2SAT 97 09/19/2021 2330  ? ?CBG (last 3)  ?Recent Labs  ?  09/21/21 ?1709 09/21/21 ?2059 09/22/21 ?08250 ?GLUCAP 118* 215* 103*  ? ? ?Assessment/Plan: ?S/P Procedure(s) (LRB): ?CORONARY ARTERY BYPASS GRAFTING X1 Using Left Internal Mammory artery (N/A) ?AORTIC VALVE REPLACEMENT Using 153mEdwards Resilia Inspiris Valve (N/A) ?TRANSESOPHAGEAL ECHOCARDIOGRAM (TEE) (N/A) ? ?-POD-3 tissue AVR and single-vessel CABG. Stable cardiac rhythm and progressing with mobility. On ASA,Crestor.  ?Plan to D/C the pacer wires tomorrow. ? ?-Hypertension- NTG trip weaned off last evening, required 1 dose of PRN IV hydralazine as the NTG was weaned. BP 140-150's this morning and HR in the 90's. Will increase the metoprolol to '50mg'$  BID, continue her irbesartan.  ? ?-Type 2 DM- Glucose 180-200 yesterday. On glipizide, metformin, and Jardiance at home. Currently on Levemir 25uSQ daily, will change to 15 units BID. Continue SSI. ? ?-Volume excess- Wt is 4kg positive. Continue diuresis with oral Lasix.   ? ?-Bilateral pleural effusions- CXR this am showing stable, small effusion on the left. Right effusion / ATX mostly resolved. Continuing diuresis.  ? ?-Expected acute blood loss anemia- mild and well tolerated. AM lab pending.  ? ?-History of depression and anxiety- Wellbutrin and Zoloft resumed.   ? ?-DVT PPX- ambulate, add daily enoxaparin. ? ? LOS: 3 days  ? ? ?  Antony Odea, PA-C ?[phone] ?09/22/2021 ? ?Patient seen and examined, agree with above ?Went into AF with RVR this AM ?Currently AF with rate 100-120 on amiodarone drip ?Tolerating well ? ?Revonda Standard Roxan Hockey, MD ?[facility] ?[phone] ? ?

## 2021-09-22 NOTE — Progress Notes (Signed)
Received called from [facility] notified pt HR=150s. RN went to check. Pt asymtomatic. HR =150S- 160S. EKG confirmed afib. Administered lopressor 2.5 mg IV. HR still running 130s-150s PA notified. New orders received. Will continue to monitor the pt. ? ?Lavenia Atlas, RN ? ?

## 2021-09-22 NOTE — Care Management Important Message (Signed)
Important Message ? ?Patient Details  ?Name: Katie Buckley ?MRN: [id number] ?Date of Birth: [birth year] ? ? ?Medicare Important Message Given:  Yes ? ? ? ? ?Shelda Altes ?09/22/2021, 9:50 AM ?

## 2021-09-22 NOTE — Progress Notes (Signed)
CARDIAC REHAB PHASE I  ? ?Went to offer to walk with pt. Pts HR 120-160 Afib. BP stable, RN aware. Provided support and encouragement. Will f/u as appropriate. ? ?[phone] ?Rufina Falco, RN BSN ?09/22/2021 ?10:45 AM ? ?

## 2021-09-23 LAB — CBC
HCT: 29.6 % — ABNORMAL LOW (ref 36.0–46.0)
Hemoglobin: 9.7 g/dL — ABNORMAL LOW (ref 12.0–15.0)
MCH: 26.6 pg (ref 26.0–34.0)
MCHC: 32.8 g/dL (ref 30.0–36.0)
MCV: 81.3 fL (ref 80.0–100.0)
Platelets: 138 10*3/uL — ABNORMAL LOW (ref 150–400)
RBC: 3.64 MIL/uL — ABNORMAL LOW (ref 3.87–5.11)
RDW: 16.1 % — ABNORMAL HIGH (ref 11.5–15.5)
WBC: 10.2 10*3/uL (ref 4.0–10.5)
nRBC: 0 % (ref 0.0–0.2)

## 2021-09-23 LAB — GLUCOSE, CAPILLARY
Glucose-Capillary: 84 mg/dL (ref 70–99)
Glucose-Capillary: 194 mg/dL — ABNORMAL HIGH (ref 70–99)
Glucose-Capillary: 102 mg/dL — ABNORMAL HIGH (ref 70–99)
Glucose-Capillary: 202 mg/dL — ABNORMAL HIGH (ref 70–99)

## 2021-09-23 LAB — BASIC METABOLIC PANEL
Anion gap: 6 (ref 5–15)
BUN: 23 mg/dL (ref 8–23)
CO2: 25 mmol/L (ref 22–32)
Calcium: 8.1 mg/dL — ABNORMAL LOW (ref 8.9–10.3)
Chloride: 100 mmol/L (ref 98–111)
Creatinine, Ser: 0.85 mg/dL (ref 0.44–1.00)
GFR, Estimated: >60 mL/min (ref >60)
Glucose, Bld: 124 mg/dL — ABNORMAL HIGH (ref 70–99)
Potassium: 3.7 mmol/L (ref 3.5–5.1)
Sodium: 131 mmol/L — ABNORMAL LOW (ref 135–145)

## 2021-09-23 MED ORDER — ONDANSETRON HCL 4 MG PO TABS
4.0000 mg | ORAL_TABLET | Freq: Once | ORAL | Status: AC
  Administered 2021-09-23: 4 mg via ORAL
  Filled 2021-09-23: qty 1

## 2021-09-23 MED ORDER — PROCHLORPERAZINE EDISYLATE 10 MG/2ML IJ SOLN
10.0000 mg | Freq: Four times a day (QID) | INTRAMUSCULAR | Status: DC | PRN
  Administered 2021-09-23: 10 mg via INTRAVENOUS
  Filled 2021-09-23: qty 2

## 2021-09-23 MED ORDER — PROMETHAZINE HCL 25 MG PO TABS
25.0000 mg | ORAL_TABLET | Freq: Four times a day (QID) | ORAL | Status: DC | PRN
Start: 2021-09-23 — End: 2021-09-23

## 2021-09-23 MED ORDER — AMIODARONE HCL 200 MG PO TABS
400.0000 mg | ORAL_TABLET | Freq: Two times a day (BID) | ORAL | Status: DC
  Administered 2021-09-23: 400 mg via ORAL
  Filled 2021-09-23: qty 2

## 2021-09-23 MED ORDER — AMLODIPINE BESYLATE 10 MG PO TABS
10.0000 mg | ORAL_TABLET | Freq: Every day | ORAL | Status: DC
  Administered 2021-09-23 – 2021-09-24 (×2): 10 mg via ORAL
  Filled 2021-09-23 (×2): qty 1

## 2021-09-23 MED ORDER — POTASSIUM CHLORIDE CRYS ER 20 MEQ PO TBCR
30.0000 meq | EXTENDED_RELEASE_TABLET | Freq: Two times a day (BID) | ORAL | Status: DC
  Administered 2021-09-23: 30 meq via ORAL
  Filled 2021-09-23: qty 1

## 2021-09-23 MED ORDER — AMIODARONE HCL IN DEXTROSE 360-4.14 MG/200ML-% IV SOLN: 30.0000 mg/h | INTRAVENOUS | Status: AC

## 2021-09-23 MED ORDER — AMIODARONE HCL 200 MG PO TABS
200.0000 mg | ORAL_TABLET | Freq: Two times a day (BID) | ORAL | Status: DC
  Administered 2021-09-23 – 2021-09-24 (×2): 200 mg via ORAL
  Filled 2021-09-23 (×2): qty 1

## 2021-09-23 NOTE — Progress Notes (Addendum)
? ?   ?[address] ?      [place] [address] ?            [phone]   ? ?  ?4 Days Post-Op Procedure(s) (LRB): ?CORONARY ARTERY BYPASS GRAFTING X1 Using Left Internal Mammory artery (N/A) ?AORTIC VALVE REPLACEMENT Using 19m Edwards Resilia Inspiris Valve (N/A) ?TRANSESOPHAGEAL ECHOCARDIOGRAM (TEE) (N/A) ?Subjective: ? ?Awake and alert, rested well until about 4am.  ?Tolerating PO's and progressing with mobility.  ?Remains on RA with adequate SaO2.  ? ?A-fib yesterday with RVR->> loaded with IV amiodarone->>NSR  ? ?Objective: ?Vital signs in last 24 hours: ?Temp:  [97.7 ?F (36.5 ?C)-98.3 ?F (36.8 ?C)] 98.1 ?F (36.7 ?C) (04/14 0413) ?Pulse Rate:  [63-122] 63 (04/14 0413) ?Cardiac Rhythm: Heart block;Bundle branch block (04/13 2110) ?Resp:  [18-20] 18 (04/14 0413) ?BP: (107-138)/(60-85) 138/61 (04/14 0413) ?SpO2:  [94 %-100 %] 97 % (04/14 0413) ?  ? ?Intake/Output from previous day: ?04/13 0701 - 04/14 0700 ?In: 739.2 [P.O.:480; I.V.:259.2] ?Out: -  ?Intake/Output this shift: ?No intake/output data recorded. ? ?General appearance: alert, cooperative, and no distress ?Neurologic: intact ?Heart: Regular rate and rhythm, monitor showing sinus rhythm in the 80's with first degree AV block ?Lungs: Breath sounds are clear to auscultation.  She is maintaining adequate saturations on room air. ?Abdomen: Soft and nontender ?Wound: The sternotomy incision is intact healing with no sign of complication ? ?Lab Results: ?Recent Labs  ?  09/22/21 ?0725 09/23/21 ?0133  ?WBC 12.9* 10.2  ?HGB 11.4* 9.7*  ?HCT 34.2* 29.6*  ?PLT 130* 138*  ? ? ?BMET:  ?Recent Labs  ?  09/22/21 ?0725 09/23/21 ?0133  ?NA 136 131*  ?K 3.9 3.7  ?CL 100 100  ?CO2 29 25  ?GLUCOSE 102* 124*  ?BUN 15 23  ?CREATININE 0.65 0.85  ?CALCIUM 8.6* 8.1*  ? ?  ?PT/INR:  ?No results for input(s): LABPROT, INR in the last 72 hours. ? ?ABG ?   ?Component Value Date/Time  ? PHART 7.303 (L) 09/19/2021 2330  ? HCO3 23.8 09/19/2021 2330  ? TCO2 25  09/19/2021 2330  ? ACIDBASEDEF 3.0 (H) 09/19/2021 2330  ? O2SAT 97 09/19/2021 2330  ? ?CBG (last 3)  ?Recent Labs  ?  09/22/21 ?1637 09/22/21 ?2156 09/23/21 ?06063 ?GLUCAP 185* 135* 84  ? ? ? ?Assessment/Plan: ?S/P Procedure(s) (LRB): ?CORONARY ARTERY BYPASS GRAFTING X1 Using Left Internal Mammory artery (N/A) ?AORTIC VALVE REPLACEMENT Using 170mEdwards Resilia Inspiris Valve (N/A) ?TRANSESOPHAGEAL ECHOCARDIOGRAM (TEE) (N/A) ? ?-POD-4 tissue AVR and single-vessel CABG. AF yesterday with RVR, back In SR on amio drip. Convert to PO today.  ?On ASA,Crestor.  ?D/C the pacer wires today.  ? ?-Hypertension- control better. Continue metoprolol to '50mg'$  BID and irbesartan '75mg'$  daily.  ? ?-Type 2 DM- Control is fair and appetite has not returned. On glipizide, metformin, and Jardiance at home and will resume these at discharge. Continue on Levemir 15uSQ BID. Continue SSI. ? ?-Volume excess- Output not recorded for yesterday but Wt is down 2kg, no peripheral edema, Na+ 131. Will hold off on further diuresis.  ? ?-Bilateral pleural effusions- CXR yesterday showing stable, small effusion on the left. Right effusion / ATX mostly resolved.  ? ?-Expected acute blood loss anemia- mild and well tolerated.   ?-History of depression and anxiety- Wellbutrin and Zoloft resumed.   ? ?-DVT PPX- ambulate, add daily enoxaparin. ? ?-Disposition-plan for discharge to home tomorrow if cardiac rhythm stable.  ? ? LOS: 4 days  ? ? ?  Antony Odea, PA-C ?[phone] ?09/23/2021 ? ?Patient seen and examined, agree with above ?In SR ?Will add Norvasc for BP as she is already on 50 BID of metoprolol and HR on 60s ? ?Revonda Standard Roxan Hockey, MD ?[facility] ?[phone] ? ? ? ?

## 2021-09-23 NOTE — Progress Notes (Addendum)
? ?   ?  [address] ?      [place] [address] ?            [phone]   ? ?  ?Developed nausea after the K-Dur and oral amiodarone. No vomiting. Sx unrelieved with Zofran. Will hold remaining dose of K-dur today and decrease the amiodarone to '200mg'$  BID.  ?Compazine IV  q6h prn nausea x 2 doses.  ? ?M. Taneeka Curtner, PA-C ?

## 2021-09-23 NOTE — Progress Notes (Signed)
CARDIAC REHAB PHASE I  ? ?PRE:  Rate/Rhythm: 29 SR ? ?BP:  Sitting: 132/55     ? ?SaO2: 95 RA ? ?MODE:  Ambulation: 300 ft  ? ?POST:  Rate/Rhythm: 68 SR ? ?BP:  Sitting: 122/71 ? ?  SaO2: 96 RA ? ? ?Pt ambulated 327f in hallway standby assist with slow, steady gait. Pt c/o feeling hot and weak. Pt returned to bed, vitals stable. D/c education completed with pt. Pt educated on importance of site care and monitoring incisions daily. Encouraged continued IS use, walks, and sternal precautions. Pt given in-the-tube sheet along with heart healthy and diabetic diets. Reviewed restrictions and exercise guidelines. Will refer to CRP II [place]. ? ?[phone]?TRufina Falco RN BSN ?09/23/2021 ?11:20 AM ? ? ?

## 2021-09-23 NOTE — Progress Notes (Signed)
Epicardial pacing wires removed per MD order. No complications to report at this time.  Pt tolerated well.  Pt placed on bedrest with vital checks per protocol.   ?

## 2021-09-23 NOTE — Care Management Important Message (Signed)
Important Message ? ?Patient Details  ?Name: Katie Buckley ?MRN: [id number] ?Date of Birth: 12/03/1950 ? ? ?Medicare Important Message Given:  Yes ? ? ? ? ?Shelda Altes ?09/23/2021, 11:25 AM ?

## 2021-09-24 ENCOUNTER — Inpatient Hospital Stay (HOSPITAL_COMMUNITY): Payer: PPO

## 2021-09-24 LAB — BASIC METABOLIC PANEL
Anion gap: 6 (ref 5–15)
BUN: 17 mg/dL (ref 8–23)
CO2: 24 mmol/L (ref 22–32)
Calcium: 8.7 mg/dL — ABNORMAL LOW (ref 8.9–10.3)
Chloride: 108 mmol/L (ref 98–111)
Creatinine, Ser: 0.61 mg/dL (ref 0.44–1.00)
GFR, Estimated: >60 mL/min (ref >60)
Glucose, Bld: 73 mg/dL (ref 70–99)
Potassium: 3.8 mmol/L (ref 3.5–5.1)
Sodium: 138 mmol/L (ref 135–145)

## 2021-09-24 LAB — GLUCOSE, CAPILLARY: Glucose-Capillary: 79 mg/dL (ref 70–99)

## 2021-09-24 MED ORDER — FUROSEMIDE 40 MG PO TABS: 40.0000 mg | ORAL_TABLET | Freq: Every day | ORAL | 0 refills | Status: DC

## 2021-09-24 MED ORDER — ASPIRIN 325 MG PO TBEC: 325.0000 mg | DELAYED_RELEASE_TABLET | Freq: Every day | ORAL | 0 refills | Status: DC

## 2021-09-24 MED ORDER — AMLODIPINE BESYLATE 10 MG PO TABS: 10.0000 mg | ORAL_TABLET | Freq: Every day | ORAL | 2 refills | Status: DC

## 2021-09-24 MED ORDER — FUROSEMIDE 40 MG PO TABS
40.0000 mg | ORAL_TABLET | Freq: Every day | ORAL | Status: DC
  Administered 2021-09-24: 40 mg via ORAL
  Filled 2021-09-24: qty 1

## 2021-09-24 MED ORDER — AMIODARONE HCL 200 MG PO TABS: 200.0000 mg | ORAL_TABLET | Freq: Two times a day (BID) | ORAL | 1 refills | Status: DC

## 2021-09-24 MED ORDER — TRAMADOL HCL 50 MG PO TABS: 50.0000 mg | ORAL_TABLET | Freq: Four times a day (QID) | ORAL | 0 refills | Status: AC | PRN

## 2021-09-24 MED ORDER — METOPROLOL TARTRATE 50 MG PO TABS: 50.0000 mg | ORAL_TABLET | Freq: Two times a day (BID) | ORAL | 2 refills | Status: AC

## 2021-09-24 MED ORDER — POTASSIUM CHLORIDE CRYS ER 20 MEQ PO TBCR: 20.0000 meq | EXTENDED_RELEASE_TABLET | Freq: Every day | ORAL | 0 refills | Status: DC

## 2021-09-24 MED ORDER — POTASSIUM CHLORIDE CRYS ER 20 MEQ PO TBCR
30.0000 meq | EXTENDED_RELEASE_TABLET | Freq: Two times a day (BID) | ORAL | Status: DC
  Administered 2021-09-24: 30 meq via ORAL
  Filled 2021-09-24: qty 1

## 2021-09-24 NOTE — Progress Notes (Signed)
Reviewed home activity instructions with patient along with daily weights, when to call the surgeon, signs of infection, showering routine, and sternal precautions. ?[phone] ?

## 2021-09-24 NOTE — Progress Notes (Addendum)
? ?   ?  [address] ?      [place] [address] ?            [phone]   ? ?  ? ? ?5 Days Post-Op Procedure(s) (LRB): ?CORONARY ARTERY BYPASS GRAFTING X1 Using Left Internal Mammory artery (N/A) ?AORTIC VALVE REPLACEMENT Using 25m Edwards Resilia Inspiris Valve (N/A) ?TRANSESOPHAGEAL ECHOCARDIOGRAM (TEE) (N/A) ? ?Subjective: ?Patient without complaints this am. She hopes to go home. ? ?Objective: ?Vital signs in last 24 hours: ?Temp:  [97.4 ?F (36.3 ?C)-98.7 ?F (37.1 ?C)] 98 ?F (36.7 ?C) (04/15 08381 ?Pulse Rate:  [65-80] 80 (04/15 0823) ?Cardiac Rhythm: Normal sinus rhythm;Bundle branch block;Heart block (04/14 1900) ?Resp:  [18-20] 20 (04/15 08403 ?BP: (122-168)/(50-71) 148/56 (04/15 07543 ?SpO2:  [95 %-99 %] 97 % (04/15 0823) ?Weight:  [67.5 kg] 67.5 kg (04/15 0423) ? ?Pre op weight 63.5 kg ?Current Weight  ?09/24/21 67.5 kg  ? ?  ? ?Intake/Output from previous day: ?04/14 0701 - 04/15 0700 ?In: 600 [P.O.:600] ?Out: -  ? ? ?Physical Exam: ? ?Cardiovascular: RRR, no murmur ?Pulmonary: Slightly diminished bibasilar breath sounds ?Abdomen: Soft, non tender, bowel sounds present. ?Extremities: Trace bilateral lower extremity edema. ?Wounds: Clean and dry.  No erythema or signs of infection. ? ?Lab Results: ?CBC: ?Recent Labs  ?  09/22/21 ?0725 09/23/21 ?0133  ?WBC 12.9* 10.2  ?HGB 11.4* 9.7*  ?HCT 34.2* 29.6*  ?PLT 130* 138*  ? ?BMET:  ?Recent Labs  ?  09/23/21 ?0133 09/24/21 ?0315  ?NA 131* 138  ?K 3.7 3.8  ?CL 100 108  ?CO2 25 24  ?GLUCOSE 124* 73  ?BUN 23 17  ?CREATININE 0.85 0.61  ?CALCIUM 8.1* 8.7*  ?  ?PT/INR:  ?Lab Results  ?Component Value Date  ? INR 1.6 (H) 09/19/2021  ? INR 0.9 11/15/2020  ? INR 0.9 11/03/2020  ? ?ABG:  ?INR: ?Will add last result for INR, ABG once components are confirmed ?Will add last 4 CBG results once components are confirmed ? ?Assessment/Plan: ? ?1. CV - Previous a fib with RVR, . SR, first degree heart block this am. On Amiodarone 200 mg bid, Lopressor 50 mg  bid, Amlodipine 10 mg daily, and Irbesartan 75 mg daily. ?2.  Pulmonary - On room air. CXR this am appears stable. Encourage incentive spirometer. ?3. Volume Overload - Will give Lasix 40 mg this am ?4.  Expected post op acute blood loss anemia - H and H yesterday decreased to 9.7 and 29.6. ?5. DM-CBGs 102/202/79. On Insulin. Will restart Glipizide 10 mg daily, Jardiance 25 mg daily, and Metformin XR 1000 mg daily as taken prior to surgery. Pre op HGA1C 8. She will need close medical follow up after discharge ?6. Supplement potassium ?7. Thrombocytopenia-platelets yesterday up to 138,000 ?8. On Lovenox for DVT prophylaxis ?9. Likely discharge ? ?Donielle M ZimmermanPA-C ?8:31 AM ?  ? ?Looks great, will dc home today ? ?SRevonda StandardHRoxan Hockey MD ?[facility] ?[phone] ?

## 2021-09-24 NOTE — Progress Notes (Signed)
Order received to discharge patient.  Telemetry monitor removed and CCMD notified.  PIV access removed.  Discharge instructions, follow up, medications and instructions for their use discussed with patient. 

## 2021-09-26 ENCOUNTER — Other Ambulatory Visit: Payer: Self-pay | Admitting: Surgery

## 2021-09-26 DIAGNOSIS — Z952 Presence of prosthetic heart valve: Secondary | ICD-10-CM

## 2021-09-27 ENCOUNTER — Other Ambulatory Visit: Payer: Self-pay | Admitting: Family Medicine

## 2021-09-27 ENCOUNTER — Encounter: Payer: PPO | Admitting: Physical Therapy

## 2021-09-27 DIAGNOSIS — I4819 Other persistent atrial fibrillation: Secondary | ICD-10-CM | POA: Diagnosis not present

## 2021-09-27 DIAGNOSIS — E1151 Type 2 diabetes mellitus with diabetic peripheral angiopathy without gangrene: Secondary | ICD-10-CM | POA: Diagnosis not present

## 2021-09-27 DIAGNOSIS — I2510 Atherosclerotic heart disease of native coronary artery without angina pectoris: Secondary | ICD-10-CM | POA: Diagnosis not present

## 2021-09-27 DIAGNOSIS — Z8679 Personal history of other diseases of the circulatory system: Secondary | ICD-10-CM | POA: Diagnosis not present

## 2021-09-27 DIAGNOSIS — Z7982 Long term (current) use of aspirin: Secondary | ICD-10-CM | POA: Diagnosis not present

## 2021-09-27 DIAGNOSIS — E1165 Type 2 diabetes mellitus with hyperglycemia: Secondary | ICD-10-CM | POA: Diagnosis not present

## 2021-09-27 DIAGNOSIS — Z951 Presence of aortocoronary bypass graft: Secondary | ICD-10-CM | POA: Diagnosis not present

## 2021-09-27 DIAGNOSIS — I110 Hypertensive heart disease with heart failure: Secondary | ICD-10-CM | POA: Diagnosis not present

## 2021-09-27 DIAGNOSIS — I5032 Chronic diastolic (congestive) heart failure: Secondary | ICD-10-CM | POA: Diagnosis not present

## 2021-09-27 DIAGNOSIS — Z7984 Long term (current) use of oral hypoglycemic drugs: Secondary | ICD-10-CM | POA: Diagnosis not present

## 2021-09-27 DIAGNOSIS — Z952 Presence of prosthetic heart valve: Secondary | ICD-10-CM | POA: Diagnosis not present

## 2021-09-28 ENCOUNTER — Telehealth: Payer: Self-pay

## 2021-09-28 ENCOUNTER — Telehealth: Payer: Self-pay | Admitting: *Deleted

## 2021-09-28 NOTE — Telephone Encounter (Signed)
Patient contacted the office with questions regarding medications and heartburn. Per patient, her discharge instructions stated for her to take irbesartan every other day but the bottle instructions say everyday. Advised patient to take medication as prescribed at everyday. Patient c/o heartburn. Advised patient she likely has resumed a normal diet after discharge. Advised patient to resume taking home antireflux medication as instructed on discharge medication list. Encouraged patient to contact PCP for further management if needed. Patient verbalized understanding.  ?

## 2021-09-28 NOTE — Telephone Encounter (Signed)
Appointment scheduled for 09/30/2021 at 9:20am. Patient advised.  ?

## 2021-09-28 NOTE — Telephone Encounter (Signed)
Copied from [place] [id number]. Topic: Appointment Scheduling - Scheduling Inquiry for Clinic ?>> Sep 27, 2021 10:17 AM Loma [place] wrote: ?Reason for CRM: FU this [phone] Pt had a pre- surgury sch for open heart, provider had to cancel, pt is now back home and need HFU but nothing available into mid May. Pt is out of meds also, pls fu with pt for sch, pt really wants to see Dr Caryn Section. ?

## 2021-09-30 ENCOUNTER — Ambulatory Visit: Payer: PPO

## 2021-09-30 ENCOUNTER — Encounter: Payer: Self-pay | Admitting: Family Medicine

## 2021-09-30 ENCOUNTER — Ambulatory Visit (INDEPENDENT_AMBULATORY_CARE_PROVIDER_SITE_OTHER): Payer: PPO | Admitting: Family Medicine

## 2021-09-30 VITALS — BP 145/49 | HR 67 | Temp 98.6°F | Resp 18 | Wt 147.0 lb

## 2021-09-30 DIAGNOSIS — E1140 Type 2 diabetes mellitus with diabetic neuropathy, unspecified: Secondary | ICD-10-CM | POA: Diagnosis not present

## 2021-09-30 DIAGNOSIS — Z953 Presence of xenogenic heart valve: Secondary | ICD-10-CM | POA: Diagnosis not present

## 2021-09-30 DIAGNOSIS — G629 Polyneuropathy, unspecified: Secondary | ICD-10-CM

## 2021-09-30 DIAGNOSIS — R12 Heartburn: Secondary | ICD-10-CM

## 2021-09-30 MED ORDER — TIRZEPATIDE 5 MG/0.5ML ~~LOC~~ SOAJ: 5.0000 mg | SUBCUTANEOUS | 0 refills | Status: DC

## 2021-09-30 MED ORDER — VALACYCLOVIR HCL 1 G PO TABS: ORAL_TABLET | ORAL | 3 refills | Status: DC

## 2021-09-30 MED ORDER — TIRZEPATIDE 5 MG/0.5ML ~~LOC~~ SOAJ: 5.0000 mg | SUBCUTANEOUS | 3 refills | Status: DC

## 2021-09-30 MED ORDER — ONDANSETRON 4 MG PO TBDP
4.0000 mg | ORAL_TABLET | Freq: Three times a day (TID) | ORAL | 0 refills | Status: DC | PRN
Start: 2021-09-30 — End: 2022-03-29

## 2021-09-30 MED ORDER — RABEPRAZOLE SODIUM 20 MG PO TBEC: 20.0000 mg | DELAYED_RELEASE_TABLET | Freq: Every day | ORAL | 3 refills | Status: DC

## 2021-09-30 NOTE — Progress Notes (Signed)
?  ? ?I,Roshena L Chambers,acting as a scribe for Mila Merry, MD.,have documented all relevant documentation on the behalf of Mila Merry, MD,as directed by  Mila Merry, MD while in the presence of Mila Merry, MD.  ? ?Established patient visit ? ? ?Patient: Katie Buckley   DOB: 02-09-1951   71 y.o. Female  MRN: [id number] ?Visit Date: 09/30/2021 ? ?Today's healthcare provider: Mila Merry, MD  ? ?Chief Complaint  ?Patient presents with  ? Hospitalization Follow-up  ? ?Subjective  ?  ?HPI  ?Follow up Hospitalization ? ?Patient was admitted to [facility] on 09/19/2021 and discharged on 09/24/2021. ?She was treated for: Severe aortic stenosis ?Aortic insufficiency, moderate ?Single-vessel coronary artery disease ?Left ventricular hypertrophy with diastolic dysfunction ?Dyslipidemia ?Type 2 diabetes mellitus with neuropathy. ?Treatment for this included surgical aortic valve replacement and CABG. ?Telephone follow up was not done. ?She reports good compliance with treatment. ?She reports this condition is improved. She reports she has had heart burn since being discharged from the hospital. She states this a recurrent burning sensation from her mid chest radiating into left flank.  ? ?She also has some shortness of breath. Patient reports feeling dizzy yesterday after taking Irbesartan. ? ?She has follow up with Dr. Juliann Pares and CT surgery next week.  ?----------------------------------------------------------------------------------------- - ? ?Medications: ?Outpatient Medications Prior to Visit  ?Medication Sig  ? acetaminophen (TYLENOL) 650 MG CR tablet Take 1,300 mg by mouth every 8 (eight) hours as needed for pain (pain).  ? amiodarone (PACERONE) 200 MG tablet Take 1 tablet (200 mg total) by mouth 2 (two) times daily. For 7 days then take 200 mg daily thereafter  ? amLODipine (NORVASC) 10 MG tablet Take 1 tablet (10 mg total) by mouth daily.  ? aspirin EC 325 MG EC tablet Take 1 tablet (325 mg total)  by mouth daily.  ? buPROPion (WELLBUTRIN SR) 150 MG 12 hr tablet Take 150 mg by mouth 2 (two) times daily.  ? butalbital-acetaminophen-caffeine (FIORICET) 50-325-40 MG tablet TAKE 1 TABLET BY MOUTH EVERY 6 HOURS AS NEEDED FOR HEADACHE (Patient taking differently: Take 1 tablet by mouth every 6 (six) hours as needed for headache.)  ? Esomeprazole Magnesium 20 MG TBEC Take 20 mg by mouth daily before breakfast.  ? ezetimibe (ZETIA) 10 MG tablet TAKE 1 TABLET BY MOUTH EVERYDAY AT BEDTIME (Patient taking differently: Take 10 mg by mouth at bedtime.)  ? fexofenadine (ALLEGRA) 180 MG tablet Take 180 mg by mouth daily as needed for allergies or rhinitis.  ? fluconazole (DIFLUCAN) 150 MG tablet Take one tablet on day you receive medication Take second tablet three days later Take third and last tablet three days later  ? furosemide (LASIX) 40 MG tablet Take 1 tablet (40 mg total) by mouth daily. For 5 days then stop.  ? glipiZIDE (GLUCOTROL) 10 MG tablet TAKE 1 TABLET BY MOUTH EVERY DAY BEFORE BREAKFAST (Patient taking differently: Take 10 mg by mouth daily before breakfast.)  ? hydrOXYzine (VISTARIL) 25 MG capsule Take 25 mg by mouth daily as needed for anxiety.  ? irbesartan (AVAPRO) 75 MG tablet TAKE 1 TABLET BY MOUTH EVERY DAY  ? JARDIANCE 25 MG TABS tablet TAKE 1 TABLET (25 MG TOTAL) BY MOUTH DAILY. (Patient taking differently: 25 mg daily.)  ? [employer] MISC   ? loperamide (IMODIUM) 2 MG capsule Take 2-4 mg by mouth daily as needed for diarrhea or loose stools.  ? metFORMIN (GLUCOPHAGE-XR) 500 MG 24 hr tablet Take 2 tablets (1,000 mg  total) by mouth daily.  ? methocarbamol (ROBAXIN) 500 MG tablet Take 1 tablet (500 mg total) by mouth every 6 (six) hours as needed for muscle spasms.  ? metoprolol tartrate (LOPRESSOR) 50 MG tablet Take 1 tablet (50 mg total) by mouth 2 (two) times daily.  ? montelukast (SINGULAIR) 10 MG tablet TAKE 1 TABLET BY MOUTH EVERY DAY (Patient taking differently: Take 10 mg by mouth at  bedtime as needed (allergies).)  ? Olopatadine HCl 0.7 % SOLN Place 1 drop into both eyes daily as needed (allergies).  ? ondansetron (ZOFRAN ODT) 4 MG disintegrating tablet Take 1 tablet (4 mg total) by mouth every 8 (eight) hours as needed for nausea or vomiting.  ? pregabalin (LYRICA) 100 MG capsule Take 100 mg by mouth 3 (three) times daily.  ? rosuvastatin (CRESTOR) 20 MG tablet TAKE 1 TABLET BY MOUTH EVERY DAY IN THE EVENING (Patient taking differently: Take 20 mg by mouth daily.)  ? sertraline (ZOLOFT) 100 MG tablet Take 200 mg by mouth daily.  ? simethicone (MYLICON) 80 MG chewable tablet Chew 80 mg by mouth every 6 (six) hours as needed for flatulence.  ? traMADol (ULTRAM) 50 MG tablet Take 1 tablet (50 mg total) by mouth every 6 (six) hours as needed for up to 7 days for severe pain.  ? traZODone (DESYREL) 50 MG tablet Take 50-100 mg by mouth at bedtime as needed for sleep.   ? potassium chloride (KLOR-CON M) 20 MEQ tablet Take 1 tablet (20 mEq total) by mouth daily. For 5 days then stop. (Patient not taking: Reported on 09/30/2021)  ? tirzepatide [facility]) 5 MG/0.5ML Pen Inject 5 mg into the skin once a week. (Patient not taking: Reported on 09/30/2021)  ? valACYclovir (VALTREX) 1000 MG tablet Take 1 tablet daily for 5 days as needed (Patient not taking: Reported on 09/30/2021)  ? ?No facility-administered medications prior to visit.  ? ? ?Review of Systems  ?Constitutional:  Negative for appetite change, chills, fatigue and fever.  ?Respiratory:  Positive for shortness of breath. Negative for chest tightness.   ?Cardiovascular:  Negative for chest pain and palpitations.  ?Gastrointestinal:  Negative for abdominal pain, nausea and vomiting.  ?Neurological:  Positive for dizziness. Negative for weakness.  ? ? ?  Objective  ?  ?BP (!) 145/49 (BP Location: Left Arm, Patient Position: Sitting, Cuff Size: Large)   Pulse 67   Temp 98.6 ?F (37 ?C) (Oral)   Resp 18   Wt 147 lb (66.7 kg)   SpO2 96% Comment:  room air  BMI 28.71 kg/m?  ? ? ?Physical Exam  ? ? ?General: Appearance:     ?Overweight female in no acute distress  ?Eyes:    PERRL, conjunctiva/corneas clear, EOM's intact       ?Lungs:     Clear to auscultation bilaterally, respirations unlabored  ?Heart:    Normal heart rate. Normal rhythm.   ?MS:   All extremities are intact. Tender anterior and left lateral chest wall.   ?Neurologic:   Awake, alert, oriented x 3. No apparent focal neurological defect.   ?   ?  ? Assessment & Plan  ?  ? ?1. Status post aortic valve replacement with tissue valve ?Doing well postoperatively with no cardiac sx with follow scheduled with cardiology and CT surgery next week. She is having some chest wall pain and burning which she described as heartburn as below, although this may be post surgical chest wall pain. ? ?2. Heartburn ?Not clear if  this acid reflux, gastritis, or all post surgical chest wall pain. Will change from OTC esomeprazole to  RABEprazole (ACIPHEX) 20 MG tablet; Take 1 tablet (20 mg total) by mouth daily.  Dispense: 30 tablet; Refill: 3 ? ?3. Type 2 diabetes mellitus with diabetic neuropathy, without long-term current use of insulin (HCC) ? ?- Urine Albumin-Creatinine with uACR ? ?She had been doing well on Mounjaro before it ran out several weeks ago. Will restart tirzepatide [place]) 5 MG/0.5ML Pen; Inject 5 mg into the skin once a week.  Dispense: 2 mL; Refill: 3 ? ?Future Appointments  ?Date Time Provider Department Center  ?10/04/2021 10:00 AM [facility] [place] NURSE [facility]  ?10/04/2021  4:00 PM Cira Rue, PT [facility] None  ?10/06/2021  3:15 PM Cira Rue, PT [facility] None  ?10/19/2021  1:00 PM [facility] [place] PA [facility]  ?11/09/2021 11:15 AM [facility] 2 [id number]  ?11/10/2021  4:00 PM Cira Rue, PT [facility] None  ?01/02/2022 10:00 AM Naziyah Tieszen, Demetrios Isaacs, MD [facility]  ?  ?4. Neuropathy ?Continue current medications.   ? ?Refill ?- ondansetron (ZOFRAN ODT) 4 MG  disintegrating tablet; Take 1 tablet (4 mg total) by mouth every 8 (eight) hours as needed for nausea or vomiting.  Dispense: 20 tablet; Refill: 0 ?- valACYclovir (VALTREX) 1000 MG tablet; Take 1 tablet daily for 5 da

## 2021-09-30 NOTE — Patient Instructions (Signed)
.   Please review the attached list of medications and notify my office if there are any errors.   . Please bring all of your medications to every appointment so we can make sure that our medication list is the same as yours.   

## 2021-10-02 LAB — MICROALBUMIN / CREATININE URINE RATIO
Creatinine, Urine: 40.8 mg/dL
Microalb/Creat Ratio: 27 mg/g creat (ref 0–29)
Microalbumin, Urine: 11 ug/mL

## 2021-10-03 ENCOUNTER — Ambulatory Visit: Payer: PPO

## 2021-10-03 DIAGNOSIS — Z952 Presence of prosthetic heart valve: Secondary | ICD-10-CM | POA: Diagnosis not present

## 2021-10-03 DIAGNOSIS — R52 Pain, unspecified: Secondary | ICD-10-CM | POA: Diagnosis not present

## 2021-10-03 DIAGNOSIS — I25118 Atherosclerotic heart disease of native coronary artery with other forms of angina pectoris: Secondary | ICD-10-CM | POA: Diagnosis not present

## 2021-10-03 DIAGNOSIS — I350 Nonrheumatic aortic (valve) stenosis: Secondary | ICD-10-CM | POA: Diagnosis not present

## 2021-10-04 ENCOUNTER — Telehealth: Payer: Self-pay | Admitting: Physical Therapy

## 2021-10-04 ENCOUNTER — Ambulatory Visit: Payer: PPO | Attending: Neurosurgery | Admitting: Physical Therapy

## 2021-10-04 ENCOUNTER — Ambulatory Visit (INDEPENDENT_AMBULATORY_CARE_PROVIDER_SITE_OTHER): Payer: Self-pay

## 2021-10-04 DIAGNOSIS — Z4802 Encounter for removal of sutures: Secondary | ICD-10-CM

## 2021-10-04 NOTE — Progress Notes (Signed)
Patient arrived for nurse visit to remove sutures post- procedure CABG x1 with Dr. Cyndia Bent 09/19/21.  Sutures removed with no signs/ symptoms of infection noted.  Patient tolerated procedure well.  Patient/ family instructed to keep the incision sites clean and dry.  Patient/ family acknowledged instructions given.  Patient inquired about when she could drive. Advised she may be cleared to drive at her next follow-up appointment. She states she is no longer needing pain medication. She states that she is having a problem with phlegm and coughing and wanted to know if that would cause a problem. Advised that she could take plain over-the-counter mucinex as needed to help with congestion. She acknowledged receipt. ?

## 2021-10-04 NOTE — Telephone Encounter (Signed)
Called patient after she did not come to her appointment scheduled at 4pm today. Left VM notifying patient of the missed visit and requesting call back to get agreement on future schedule. ? ?Katie Buckley. Graylon Good, PT, DPT ?10/04/21, 4:40 PM ? ?[place] ?[address] ?[place], [place] [address] ?P: [phone] I F: [phone] ? ?

## 2021-10-06 ENCOUNTER — Ambulatory Visit: Payer: PPO | Admitting: Physical Therapy

## 2021-10-06 ENCOUNTER — Telehealth: Payer: Self-pay

## 2021-10-06 NOTE — Telephone Encounter (Signed)
Author reached out to patient again regarding scheduling for PT. It does not appear that a new order for PT has been received. It appears that pt has been referred to cardiac rehab s/p cardiac surgery 2 weeks ago. Pt plans to self DC from PT while cardiac rehab is under way. Mutual agreement to cancel remaining PT appointments. Pt encouraged to obtain a new referral after cardiac rehab is completed shoulder she feel to have remaining impairment that need be addressed in PT.  ? ?11:01 AM, 10/06/21 ?Etta Grandchild, PT, DPT ?Physical Therapist - [place] ?[phone] (Office) ? ? ? ?

## 2021-10-10 ENCOUNTER — Encounter: Payer: Self-pay | Admitting: Physical Therapy

## 2021-10-10 ENCOUNTER — Telehealth: Payer: Self-pay

## 2021-10-10 DIAGNOSIS — R262 Difficulty in walking, not elsewhere classified: Secondary | ICD-10-CM

## 2021-10-10 DIAGNOSIS — Z9181 History of falling: Secondary | ICD-10-CM

## 2021-10-10 DIAGNOSIS — R2681 Unsteadiness on feet: Secondary | ICD-10-CM

## 2021-10-10 DIAGNOSIS — R42 Dizziness and giddiness: Secondary | ICD-10-CM

## 2021-10-10 NOTE — Therapy (Signed)
?OUTPATIENT PHYSICAL THERAPY NO-VISIT DISCHARGE SUMMARY ?Dates of reporting: 07/19/2021 to 10/06/2021 ? ? ?Patient Name: Katie Buckley ?MRN: [id number] ?DOB:[birth year], 71 y.o., female ?Today's Date: 10/10/2021 ? ?PCP: [name], MD ?REFERRING PROVIDER: Jennings Books, MD ? ? ? ? ? ? ?Past Medical History:  ?Diagnosis Date  ? Anxiety   ? Aortic stenosis 01/21/2013  ? 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 Frequent sinus infections   ? Headache   ? Tension headaches  ? History of Barrett's esophagus   ? Hyperchloremia   ? MRSA (methicillin resistant staph aureus) culture positive 2018  ? history of, in hand  ? Palpitations   ? Seasonal allergies   ? Skin cancer   ? 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 ?Past Surgical History:  ?Procedure Laterality Date  ? ANTERIOR CERVICAL DECOMP/DISCECTOMY FUSION N/A 11/15/2020  ? Procedure: ANTERIOR CERVICAL DECOMPRESSION/DISCECTOMY FUSION 1 LEVEL C3/4;  Surgeon: Deetta Perla, MD;  Location: [facility];  Service: Neurosurgery;  Laterality: N/A;  ? AORTIC VALVE REPLACEMENT N/A 09/19/2021  ? Procedure: AORTIC VALVE REPLACEMENT Using 74mm Edwards Resilia Inspiris Valve;  Surgeon: Gaye Pollack, MD;  Location: [place];  Service: Open Heart Surgery;  Laterality: N/A;  ? AUGMENTATION MAMMAPLASTY Bilateral   ? breast implants  ? Bone Spur  2007  ? foot  ? CARDIAC CATHETERIZATION    ? Carpal Tunnel Symdrome  2008  ? as repeated in 2009  ? CATARACT EXTRACTION W/PHACO Left 01/16/2019  ? Procedure: CATARACT EXTRACTION PHACO AND INTRAOCULAR LENS PLACEMENT ([place]) LEFT DIABETES VISION BLUE;  Surgeon: Marchia Meiers, MD;  Location: [facility];  Service: Ophthalmology;  Laterality: Left;  [place]  00:47 ?CDE 6.62 ?Fluid pack lot # [id number]  ? CATARACT EXTRACTION W/PHACO Right 02/06/2019  ? Procedure: CATARACT EXTRACTION PHACO AND INTRAOCULAR LENS PLACEMENT (I[facility]);  Surgeon: Marchia Meiers, MD;  Location: [facility];  Service: Ophthalmology;  Laterality: Right;  [place] 00:54.6 ?CDE 6.52 ?FLUID PACK LOT # [id number]  ?  CHOLECYSTECTOMY  2007  ? COLONOSCOPY WITH PROPOFOL N/A 11/17/2015  ? Procedure: COLONOSCOPY WITH PROPOFOL;  Surgeon: Manya Silvas, MD;  Location: [facility] ENDOSCOPY;  Service: Endoscopy;  Laterality: N/A;  ? CORONARY ARTERY BYPASS GRAFT N/A 09/19/2021  ? Procedure: CORONARY ARTERY BYPASS GRAFTING X1 Using Left Internal Mammory artery;  Surgeon: Gaye Pollack, MD;  Location: [place];  Service: Open Heart Surgery;  Laterality: N/A;  ? ESOPHAGOGASTRODUODENOSCOPY (EGD) WITH PROPOFOL N/A 11/17/2015  ? Procedure: ESOPHAGOGASTRODUODENOSCOPY (EGD) WITH PROPOFOL;  Surgeon: Manya Silvas, MD;  Location: [facility] ENDOSCOPY;  Service: Endoscopy;  Laterality: N/A;  ? EYE SURGERY Bilateral   ? cataract extraction  ? FOOT SURGERY    ? INTRAVASCULAR PRESSURE WIRE/FFR STUDY N/A 05/24/2021  ? Procedure: INTRAVASCULAR PRESSURE WIRE/FFR STUDY;  Surgeon: Yolonda Kida, MD;  Location: [place] CV LAB;  Service: Cardiovascular;  Laterality: N/A;  ? MANDIBLE SURGERY    ? NASAL SINUS SURGERY  03/2013  ? Multile surgery Dr. Carlis Abbott. DX eosinophilic sinusitis [phone]  ? Release of Trigger Finger Right 10/2010  ? Dr. Tamala Julian  ? RIGHT/LEFT HEART CATH AND CORONARY ANGIOGRAPHY N/A 05/24/2021  ? Procedure: RIGHT/LEFT HEART CATH AND CORONARY ANGIOGRAPHY;  Surgeon: Yolonda Kida, MD;  Location: [place] [facility];  Service: Cardiovascular;  Laterality: N/A;  ? TEE WITHOUT CARDIOVERSION N/A 09/19/2021  ? Procedure: TRANSESOPHAGEAL ECHOCARDIOGRAM (TEE);  Surgeon: Gaye Pollack, MD;  Location: [place];  Service: Open Heart Surgery;  Laterality: N/A;  ? TONSILLECTOMY  1960  ? ?Patient Active Problem List  ? Diagnosis Date Noted  ? Status post aortic valve replacement with tissue valve 09/30/2021  ? Neuropathy 09/30/2021  ? S/P aortic valve replacement 09/19/2021  ? Cervical myelopathy ([place]) 11/15/2020  ? LVH (left ventricular hypertrophy) 10/14/2020  ? COVID-19 05/19/2020  ? MRSA cellulitis 04/19/2018  ? Decreased libido 02/04/2018  ? Diabetes  mellitus with neuropathy ([place]) 04/20/2017  ? H/O adenomatous polyp of colon 08/20/2015  ? Rupture of implant of right breast 05/18/2015  ? Anxiety disorder 04/26/2015  ? Allergic rhinitis 04/21/2015  ? Anemia 04/21/2015  ? Asthma 04/21/2015  ? 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/21/2015  ? Chronic infection of sinus 04/21/2015  ? Depression 04/21/2015  ? GERD (gastroesophageal reflux disease) 04/21/2015  ? Headache 04/21/2015  ? Menopausal symptoms 04/21/2015  ? Obesity 04/21/2015  ? Palpitations 04/21/2015  ? Vitamin B12 deficiency 04/21/2015  ? Diastolic dysfunction 62/95/2841  ? Diabetes mellitus out of control 06/12/2006  ? Barrett esophagus 06/13/2003  ? Essential hypertension 06/12/1998  ? Genital herpes 06/12/1998  ? Hyperlipidemia, mixed 06/12/1998  ? ? ?REFERRING DIAG: imbalance ? ?THERAPY DIAG:  ?Unsteadiness on feet ? ?Dizziness and giddiness ? ?History of falling ? ?Difficulty in walking, not elsewhere classified ? ?PERTINENT HISTORY: Patient is a 71 y.o. female who presents to outpatient physical therapy with a referral for medical diagnosis imbalance. This patient's chief complaints consist of unsteadiness on feet, feeling of heaviness in eyes, history of falling, double vision, feeling clouded cognitively leading to the following functional deficits: she doesn't feel clear-headed, she has become very self-conscious that when she looks at people, she is reluctant to make eye contact (not like her), she is having facial tics that bothers her, she feels she is looking funny when people look at her. She thinks because of this fogginess in her head it makes her balance worse. Hard to do things period because she is always feeling she is unsteady. Relevant past medical history and comorbidities include anxiety, aortic stenosis (needs valve stent or replacement), 70% occluded coronary artery (stent to be placed), depression, skin cancer, GERD, heart murmur, hyperchloremia, hypertension, neuropathy, skin cancer, type II  diabetes mellitus (a1c very high), asthma, anxiety, left ventricular hypertrophy, diabetic neuropathy, history of ACDF level C3/4 11/15/2020, movement disorder including facial tics (referred to [facility] clinic and waiting assessment), and per patient report unexplained weight loss (25-35lbs in past year) and a heart murmur. Patient denies history of stroke, seizure, lung problems, bowel or bladder changes, groin numbness or tingling. Patient denies hx of cancer, stroke, seizures, lung problem, major cardiac events, diabetes, unexplained weight loss, changes in bowel or bladder problems, new onset stumbling or dropping things apart from described below. ? ?PRECAUTIONS: falls ? ?SUBJECTIVE: Patient spoke to covering PT by phone. She is starting cardiac rehab soon after open heart surgery, and agreed to discharge from OP PT at this time and return if needed following cardiac rehab.  ? ?OBJECTIVE ?Patient is not present for examination at this time. Please see previous documentation for latest objective data.  ? ? ? PT Short Term Goals   ? ?  ? PT SHORT TERM GOAL #1  ? Title Patient will be independent with inital home exercise program to improve strength/mobility/balance and functional independence with ADLs.   ? Baseline HEP to be provided at 2nd visit as appropriate (07/19/2021); not participating (09/06/2021);  ? Time 2   ? Period Weeks   ? Status Not met  ? Target Date 08/03/21   ? ?  ?  ? ?  ? ? ?  PT Long Term Goals   ? TARGET DATE FOR ALL LONG TERM GOALS: 10/11/2021 ?  ? PT LONG TERM GOAL #1  ? Title Patient will be independent with long term home exercise program to improve strength/mobility/balance and functional independence with ADLs.   ? Baseline initial HEP to be provided at visit 2 as appropriate (07/19/2021); patient has received HEP but is not currently participating regularly (09/01/2021);   ? Time 12   ? Period Weeks   ? Status Not met  ?  ? PT LONG TERM GOAL #2  ? Title Demonstrate improved FOTO score by 10 units  to demonstrate improvement in overall condition and self-reported functional ability.   ? Baseline 50 (07/19/2021); 48 at visit #10 (09/01/2021);   ? Time 12   ? Period Weeks   ? Status Not met  ?  ? PT LONG T

## 2021-10-10 NOTE — Telephone Encounter (Signed)
Pt called and states that she has a cough and congestion. Pt was seen in office 09/30/21, she wants to know if medication can be prescribed for cough and congestion. Please advise pt to let her know if medication can be sent to her pharmacy. ?

## 2021-10-10 NOTE — Telephone Encounter (Signed)
I called and advised patient that she needs to schedule appointment for evaluation. Patient informed me that she spoke with her Cardiologist today and they recommended that she take Coricidin HBP. Patient would like to try taking that medication 1st, and will call back to schedule appointment if there is no improvement in symptoms.  ?

## 2021-10-10 NOTE — Telephone Encounter (Signed)
Patient contacted the office today stating that she is still having chest congestion even after taking Mucinex. She is s/p CABG/AVR with Dr. Cyndia Bent 09/19/21. States that she does have a productive cough and woke up this morning with head congestion, dizziness, and headache. Pt denies shortness of breath and dry cough. She states that she feels like she is coming down with something. Advised that she needs to see her PCP, Dr. Caryn Section for further evaluation. Advised that she could also take Coricidin HBP if needed, but she needs to see her PCP. She acknowledged receipt.  ?

## 2021-10-11 LAB — ECHO INTRAOPERATIVE TEE
AR max vel: 1.28 cm2
AV Area VTI: 1.51 cm2
AV Area mean vel: 1.4 cm2
AV Mean grad: 17.5 mmHg
AV Peak grad: 26.7 mmHg
Ao pk vel: 2.59 m/s
Height: 60 in
MV VTI: 2.18 cm2
P 1/2 time: 345 msec
S' Lateral: 2.13 cm
Weight: 2240 oz

## 2021-10-14 ENCOUNTER — Encounter: Payer: PPO | Attending: Internal Medicine | Admitting: *Deleted

## 2021-10-14 DIAGNOSIS — Z48812 Encounter for surgical aftercare following surgery on the circulatory system: Secondary | ICD-10-CM | POA: Insufficient documentation

## 2021-10-14 DIAGNOSIS — Z951 Presence of aortocoronary bypass graft: Secondary | ICD-10-CM

## 2021-10-14 DIAGNOSIS — Z952 Presence of prosthetic heart valve: Secondary | ICD-10-CM

## 2021-10-14 NOTE — Progress Notes (Signed)
Virtual orientation call completed today. shehas an appointment on Date: 10/20/2021  for EP eval and gym Orientation.  Documentation of diagnosis can be found in [facility] Date: 09/19/2021 .  ? ?

## 2021-10-16 ENCOUNTER — Other Ambulatory Visit: Payer: Self-pay | Admitting: Physician Assistant

## 2021-10-17 ENCOUNTER — Other Ambulatory Visit: Payer: Self-pay | Admitting: Surgery

## 2021-10-17 DIAGNOSIS — R41840 Attention and concentration deficit: Secondary | ICD-10-CM | POA: Diagnosis not present

## 2021-10-17 DIAGNOSIS — G4700 Insomnia, unspecified: Secondary | ICD-10-CM | POA: Diagnosis not present

## 2021-10-17 DIAGNOSIS — F411 Generalized anxiety disorder: Secondary | ICD-10-CM | POA: Diagnosis not present

## 2021-10-17 DIAGNOSIS — F331 Major depressive disorder, recurrent, moderate: Secondary | ICD-10-CM | POA: Diagnosis not present

## 2021-10-17 DIAGNOSIS — Z952 Presence of prosthetic heart valve: Secondary | ICD-10-CM

## 2021-10-19 ENCOUNTER — Ambulatory Visit (INDEPENDENT_AMBULATORY_CARE_PROVIDER_SITE_OTHER): Payer: Self-pay | Admitting: Physician Assistant

## 2021-10-19 ENCOUNTER — Ambulatory Visit
Admission: RE | Admit: 2021-10-19 | Discharge: 2021-10-19 | Disposition: A | Discharge status: Home / Self Care | Payer: PPO | Source: Ambulatory Visit | Attending: Surgery | Admitting: Surgery

## 2021-10-19 VITALS — BP 148/56 | HR 63 | Resp 20 | Ht 60.0 in | Wt 143.0 lb

## 2021-10-19 DIAGNOSIS — J9811 Atelectasis: Secondary | ICD-10-CM | POA: Diagnosis not present

## 2021-10-19 DIAGNOSIS — J90 Pleural effusion, not elsewhere classified: Secondary | ICD-10-CM | POA: Diagnosis not present

## 2021-10-19 DIAGNOSIS — Z952 Presence of prosthetic heart valve: Secondary | ICD-10-CM

## 2021-10-19 DIAGNOSIS — Z951 Presence of aortocoronary bypass graft: Secondary | ICD-10-CM

## 2021-10-19 NOTE — Progress Notes (Signed)
? ?   ?[address] ?      [place] [address] ?            [id number]   ? ?  ? ?HPI: ?Katie Buckley is a 71 year old female with past history of type 2 diabetes mellitus, hypertension, left ventricular hypertrophy, asthma, gastroesophageal reflux disease who is referred to [place] for evaluation of aortic stenosis.  During the course of preoperative work-up, she was discovered to also have single-vessel coronary artery disease with a 70% LAD stenosis.  Aortic valve replacement and coronary bypass grafting was recommended by Dr. Cyndia Bent.  ?Patient returns for routine postoperative follow-up having undergone single-vessel coronary bypass grafting with a LIMA to LAD and aortic valve replacement with a 19 mm Edwards Resilia bovine pericardial tissue valve on 09/19/2021 by Dr. Cyndia Bent.   ?The patient's early postoperative recovery while in the hospital was notable for atrial fibrillation that was managed with amiodarone and had converted back to sinus rhythm prior to her discharge. She otherwise made an uneventful recovery and was discharged on the 6th post op day.  ?Since hospital discharge the patient reports continued progress with her recovery.  She has been discharged from the home PT/OT she was receiving.  Today, she states she feels well.  She thinks she may have had 1 brief episode of palpitations last week but she said symptoms were very vague.  She states she continues to fatigue more easily than she would like.  She is not having any incisional pain.  She plans to begin cardiac rehab tomorrow and is looking forward to this. ? ? ? ?Current Outpatient Medications  ?Medication Sig Dispense Refill  ? acetaminophen (TYLENOL) 650 MG CR tablet Take 1,300 mg by mouth every 8 (eight) hours as needed for pain (pain).    ? amiodarone (PACERONE) 200 MG tablet Take 1 tablet (200 mg total) by mouth 2 (two) times daily. For 7 days then take 200 mg daily thereafter 60 tablet 1  ? amLODipine (NORVASC) 10 MG  tablet Take 1 tablet (10 mg total) by mouth daily. 30 tablet 2  ? aspirin EC 325 MG EC tablet Take 1 tablet (325 mg total) by mouth daily. 30 tablet 0  ? buPROPion (WELLBUTRIN SR) 150 MG 12 hr tablet Take 150 mg by mouth 2 (two) times daily.    ? butalbital-acetaminophen-caffeine (FIORICET) 50-325-40 MG tablet TAKE 1 TABLET BY MOUTH EVERY 6 HOURS AS NEEDED FOR HEADACHE (Patient taking differently: Take 1 tablet by mouth every 6 (six) hours as needed for headache.) 30 tablet 4  ? ezetimibe (ZETIA) 10 MG tablet TAKE 1 TABLET BY MOUTH EVERYDAY AT BEDTIME (Patient taking differently: Take 10 mg by mouth at bedtime.) 90 tablet 0  ? fexofenadine (ALLEGRA) 180 MG tablet Take 180 mg by mouth daily as needed for allergies or rhinitis.    ? fluconazole (DIFLUCAN) 150 MG tablet Take one tablet on day you receive medication Take second tablet three days later Take third and last tablet three days later 3 tablet 1  ? furosemide (LASIX) 40 MG tablet Take 1 tablet (40 mg total) by mouth daily. For 5 days then stop. (Patient not taking: Reported on 10/14/2021) 5 tablet 0  ? glipiZIDE (GLUCOTROL) 10 MG tablet TAKE 1 TABLET BY MOUTH EVERY DAY BEFORE BREAKFAST (Patient taking differently: Take 10 mg by mouth daily before breakfast.) 90 tablet 4  ? hydrOXYzine (VISTARIL) 25 MG capsule Take 25 mg by mouth daily as needed for anxiety.    ?  irbesartan (AVAPRO) 75 MG tablet TAKE 1 TABLET BY MOUTH EVERY DAY 90 tablet 0  ? JARDIANCE 25 MG TABS tablet TAKE 1 TABLET (25 MG TOTAL) BY MOUTH DAILY. (Patient taking differently: 25 mg daily.) 90 tablet 4  ? [employer] MISC     ? loperamide (IMODIUM) 2 MG capsule Take 2-4 mg by mouth daily as needed for diarrhea or loose stools.    ? metFORMIN (GLUCOPHAGE-XR) 500 MG 24 hr tablet Take 2 tablets (1,000 mg total) by mouth daily. 180 tablet 3  ? methocarbamol (ROBAXIN) 500 MG tablet Take 1 tablet (500 mg total) by mouth every 6 (six) hours as needed for muscle spasms. (Patient not taking: Reported on  10/14/2021) 60 tablet 0  ? metoprolol tartrate (LOPRESSOR) 50 MG tablet Take 1 tablet (50 mg total) by mouth 2 (two) times daily. 60 tablet 2  ? montelukast (SINGULAIR) 10 MG tablet TAKE 1 TABLET BY MOUTH EVERY DAY (Patient taking differently: Take 10 mg by mouth at bedtime as needed (allergies).) 90 tablet 0  ? Olopatadine HCl 0.7 % SOLN Place 1 drop into both eyes daily as needed (allergies). (Patient not taking: Reported on 10/14/2021)    ? ondansetron (ZOFRAN ODT) 4 MG disintegrating tablet Take 1 tablet (4 mg total) by mouth every 8 (eight) hours as needed for nausea or vomiting. (Patient not taking: Reported on 10/14/2021) 20 tablet 0  ? pregabalin (LYRICA) 100 MG capsule Take 100 mg by mouth 3 (three) times daily.    ? RABEprazole (ACIPHEX) 20 MG tablet Take 1 tablet (20 mg total) by mouth daily. 30 tablet 3  ? rosuvastatin (CRESTOR) 20 MG tablet TAKE 1 TABLET BY MOUTH EVERY DAY IN THE EVENING (Patient taking differently: Take 20 mg by mouth daily.) 90 tablet 4  ? sertraline (ZOLOFT) 100 MG tablet Take 200 mg by mouth daily.    ? simethicone (MYLICON) 80 MG chewable tablet Chew 80 mg by mouth every 6 (six) hours as needed for flatulence. (Patient not taking: Reported on 10/14/2021)    ? tirzepatide [facility]) 5 MG/0.5ML Pen Inject 5 mg into the skin once a week. 2 mL 3  ? traZODone (DESYREL) 50 MG tablet Take 50-100 mg by mouth at bedtime as needed for sleep.     ? valACYclovir (VALTREX) 1000 MG tablet Take 1 tablet daily for 5 days as needed 30 tablet 3  ? ?No current facility-administered medications for this visit.  ? ? ?Physical Exam: ?Vital signs ?BP 148/56 ?Pulse 63 ?Respirations 20 ?SPO2 95% on room air ? ?General: Katie Buckley is in no distress, she is walking unassisted and has a steady gait ?Heart: Regular rate and rhythm.  Soft systolic murmur consistent with the pericardial tissue prosthetic valve. ?Chest: Breath sounds are clear to auscultation.  The chest x-ray is reviewed and shows resolution of the  right pleural effusion: Small persistent left effusion.  Lung fields are otherwise clear. ?Wound: The sternotomy incision is well-healed and its entire length.  Sternum stable.  Chest tube sites well-healed.   ?Extremities: No peripheral edema ? ?Diagnostic Tests: ?EXAM: ?CHEST - 2 VIEW ?  ?COMPARISON:  09/24/2021 ?  ?FINDINGS: ?Cardiopericardial silhouette is at upper limits of normal for size. ?Status post AVR. Left base atelectasis with small left pleural ?effusion is similar to prior. Right pleural effusion seen previously ?has resolved in the interval. The visualized bony structures of the ?thorax are unremarkable. ?  ?IMPRESSION: ?1. Interval resolution of right pleural effusion. ?2. Persistent small left pleural effusion  and left base atelectasis. ?  ?  ?Electronically Signed ?  By: Misty Stanley M.D. ?  On: 10/19/2021 12:37 ? ? ?Impression / Plan: ?Ms. Peeks is now about 1 month post bioprosthetic aortic valve replacement and single-vessel coronary bypass grafting.  Continues to make progressive and satisfactory recovery.  She did report some occasional nausea with poor appetite and metallic taste from time to time.  This may be related to the amiodarone.  Since she thinks she may have had some palpitations last week, I am reluctant to stop the amiodarone altogether but I think it would be safe to decrease the dose to 100 mg daily at this point. ?Blood pressure was a little elevated today but she told me she had not taken her blood pressure medications this morning as she usually does. ?I encouraged her to follow through with plan to begin cardiac rehab.  She may resume driving.  We again discussed sternal precautions which she should observe for another month.  I asked her to follow-up in 1 month and at that time, if she has had no further evidence of atrial fibrillation we can stop the amiodarone altogether. ? ? ?Antony Odea, PA-C ?[facility] ?[phone] ? ?

## 2021-10-19 NOTE — Patient Instructions (Signed)
You may decrease the amiodarone to 100 mg (half a tablet) daily. ? ?You may resume driving. ? ?Continue to observe sternal precautions for another month. ? ?Follow-up with Dr. Cyndia Bent in 1 month as needed. ? ? ?

## 2021-10-20 VITALS — Ht 60.5 in | Wt 142.8 lb

## 2021-10-20 DIAGNOSIS — Z951 Presence of aortocoronary bypass graft: Secondary | ICD-10-CM | POA: Diagnosis not present

## 2021-10-20 DIAGNOSIS — Z48812 Encounter for surgical aftercare following surgery on the circulatory system: Secondary | ICD-10-CM | POA: Diagnosis not present

## 2021-10-20 DIAGNOSIS — Z952 Presence of prosthetic heart valve: Secondary | ICD-10-CM

## 2021-10-20 NOTE — Progress Notes (Signed)
Cardiac Individual Treatment Plan ? ?Patient Details  ?Name: Katie Buckley ?MRN: [id number] ?Date of Birth: [birth year] ?Referring Provider:   ?Flowsheet Row Cardiac Rehab from 10/20/2021 in [facility] Cardiac and Pulmonary Rehab  ?Referring Provider Lujean Amel MD  ? ?  ? ? ?Initial Encounter Date:  ?Flowsheet Row Cardiac Rehab from 10/20/2021 in [facility] Cardiac and Pulmonary Rehab  ?Date 10/20/21  ? ?  ? ? ?Visit Diagnosis: S/P CABG x 1 ? ?S/P aortic valve replacement ? ?Patient's Home Medications on Admission: ? ?Current Outpatient Medications:  ?  acetaminophen (TYLENOL) 650 MG CR tablet, Take 1,300 mg by mouth every 8 (eight) hours as needed for pain (pain)., Disp: , Rfl:  ?  amiodarone (PACERONE) 200 MG tablet, Take 1 tablet (200 mg total) by mouth 2 (two) times daily. For 7 days then take 200 mg daily thereafter, Disp: 60 tablet, Rfl: 1 ?  amLODipine (NORVASC) 10 MG tablet, Take 1 tablet (10 mg total) by mouth daily., Disp: 30 tablet, Rfl: 2 ?  aspirin EC 325 MG EC tablet, Take 1 tablet (325 mg total) by mouth daily., Disp: 30 tablet, Rfl: 0 ?  buPROPion (WELLBUTRIN SR) 150 MG 12 hr tablet, Take 150 mg by mouth 2 (two) times daily., Disp: , Rfl:  ?  butalbital-acetaminophen-caffeine (FIORICET) 50-325-40 MG tablet, TAKE 1 TABLET BY MOUTH EVERY 6 HOURS AS NEEDED FOR HEADACHE (Patient taking differently: Take 1 tablet by mouth every 6 (six) hours as needed for headache.), Disp: 30 tablet, Rfl: 4 ?  ezetimibe (ZETIA) 10 MG tablet, TAKE 1 TABLET BY MOUTH EVERYDAY AT BEDTIME (Patient taking differently: Take 10 mg by mouth at bedtime.), Disp: 90 tablet, Rfl: 0 ?  fexofenadine (ALLEGRA) 180 MG tablet, Take 180 mg by mouth daily as needed for allergies or rhinitis., Disp: , Rfl:  ?  fluconazole (DIFLUCAN) 150 MG tablet, Take one tablet on day you receive medication Take second tablet three days later Take third and last tablet three days later, Disp: 3 tablet, Rfl: 1 ?  furosemide (LASIX) 40 MG tablet, Take 1 tablet (40 mg  total) by mouth daily. For 5 days then stop., Disp: 5 tablet, Rfl: 0 ?  glipiZIDE (GLUCOTROL) 10 MG tablet, TAKE 1 TABLET BY MOUTH EVERY DAY BEFORE BREAKFAST (Patient taking differently: Take 10 mg by mouth daily before breakfast.), Disp: 90 tablet, Rfl: 4 ?  hydrOXYzine (VISTARIL) 25 MG capsule, Take 25 mg by mouth daily as needed for anxiety., Disp: , Rfl:  ?  irbesartan (AVAPRO) 75 MG tablet, TAKE 1 TABLET BY MOUTH EVERY DAY, Disp: 90 tablet, Rfl: 0 ?  JARDIANCE 25 MG TABS tablet, TAKE 1 TABLET (25 MG TOTAL) BY MOUTH DAILY. (Patient taking differently: 25 mg daily.), Disp: 90 tablet, Rfl: 4 ?  Lancet Devices MISC, , Disp: , Rfl:  ?  loperamide (IMODIUM) 2 MG capsule, Take 2-4 mg by mouth daily as needed for diarrhea or loose stools., Disp: , Rfl:  ?  metFORMIN (GLUCOPHAGE-XR) 500 MG 24 hr tablet, Take 2 tablets (1,000 mg total) by mouth daily., Disp: 180 tablet, Rfl: 3 ?  methocarbamol (ROBAXIN) 500 MG tablet, Take 1 tablet (500 mg total) by mouth every 6 (six) hours as needed for muscle spasms., Disp: 60 tablet, Rfl: 0 ?  metoprolol tartrate (LOPRESSOR) 50 MG tablet, Take 1 tablet (50 mg total) by mouth 2 (two) times daily., Disp: 60 tablet, Rfl: 2 ?  montelukast (SINGULAIR) 10 MG tablet, TAKE 1 TABLET BY MOUTH EVERY DAY (Patient taking differently: Take  10 mg by mouth at bedtime as needed (allergies).), Disp: 90 tablet, Rfl: 0 ?  Olopatadine HCl 0.7 % SOLN, Place 1 drop into both eyes daily as needed (allergies)., Disp: , Rfl:  ?  ondansetron (ZOFRAN ODT) 4 MG disintegrating tablet, Take 1 tablet (4 mg total) by mouth every 8 (eight) hours as needed for nausea or vomiting., Disp: 20 tablet, Rfl: 0 ?  pregabalin (LYRICA) 100 MG capsule, Take 100 mg by mouth 3 (three) times daily., Disp: , Rfl:  ?  RABEprazole (ACIPHEX) 20 MG tablet, Take 1 tablet (20 mg total) by mouth daily., Disp: 30 tablet, Rfl: 3 ?  rosuvastatin (CRESTOR) 20 MG tablet, TAKE 1 TABLET BY MOUTH EVERY DAY IN THE EVENING (Patient taking  differently: Take 20 mg by mouth daily.), Disp: 90 tablet, Rfl: 4 ?  sertraline (ZOLOFT) 100 MG tablet, Take 200 mg by mouth daily., Disp: , Rfl:  ?  simethicone (MYLICON) 80 MG chewable tablet, Chew 80 mg by mouth every 6 (six) hours as needed for flatulence., Disp: , Rfl:  ?  tirzepatide (MOUNJARO) 5 MG/0.5ML Pen, Inject 5 mg into the skin once a week., Disp: 2 mL, Rfl: 3 ?  traZODone (DESYREL) 50 MG tablet, Take 50-100 mg by mouth at bedtime as needed for sleep. , Disp: , Rfl:  ?  valACYclovir (VALTREX) 1000 MG tablet, Take 1 tablet daily for 5 days as needed, Disp: 30 tablet, Rfl: 3 ? ?Past Medical History: ?Past Medical History:  ?Diagnosis Date  ? Anxiety   ? Aortic stenosis 01/21/2013  ? 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 Frequent sinus infections   ? Headache   ? Tension headaches  ? History of Barrett's esophagus   ? Hyperchloremia   ? MRSA (methicillin resistant staph aureus) culture positive 2018  ? history of, in hand  ? Palpitations   ? Seasonal allergies   ? Skin cancer   ? 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 ? ?Tobacco Use: ?Social History  ? ?Tobacco Use  ?Smoking Status Never  ?Smokeless Tobacco Never  ? ? ?Labs: ?Review Flowsheet   ? ?  ?  Latest Ref Rng & Units 02/09/2021 03/25/2021 09/15/2021 09/19/2021  ?Labs for ITP Cardiac and Pulmonary Rehab  ?Cholestrol 0 - 200 mg/dL 236       ?LDL (calc) 0 - 99 mg/dL 108       ?HDL-C >40 mg/dL 88       ?Trlycerides <150 mg/dL 238       ?Hemoglobin A1c 4.8 - 5.6 % 11.3   9.2   8.0     ?PH, Arterial 7.35 - 7.45   7.46   7.303    ? 7.261    ? 7.286    ? 7.267    ? 7.406    ? 7.461    ? 7.451    ? 7.326    ?PCO2 arterial 32 - 48 mmHg   46   48.2    ? 57.1    ? 52.8    ? 56.2    ? 37.5    ? 36.9    ? 41.1    ? 56.6    ?Bicarbonate 20.0 - 28.0 mmol/L   32.7   23.8    ? 25.5    ? 25.1    ? 25.6    ? 23.6    ? 26.3    ? 27.3    ? 28.7    ? 29.5    ?  TCO2 22 - 32 mmol/L    25    ? 27    ? 27    ? 27    ? 25    ? 25    ? 27    ? 27    ? 27    ? 27    ? 29    ? 30     ? 26    ? 29    ? 31    ?Acid-base deficit 0.0 - 2.0 mmol/L    3.0    ? 2.0    ? 2.0    ? 2.0    ? 1.0    ?O2 Saturation %   98   97    ? 99    ? 99    ? 98    ? 94    ? 100    ? 71    ? 100    ? 100    ? ?  09/21/2021  ?Labs for ITP Cardiac and Pulmonary Rehab  ?Cholestrol 87    ?LDL (calc) 21    ?HDL-C 42    ?Trlycerides 122    ?Hemoglobin A1c   ?PH, Arterial   ?PCO2 arterial   ?Bicarbonate   ?TCO2   ?Acid-base deficit   ?O2 Saturation   ?  ? ? Multiple values from one day are sorted in reverse-chronological order  ?  ?  ? ? ? ?Exercise Target Goals: ?Exercise Program Goal: ?Individual exercise prescription set using results from initial 6 min walk test and THRR while considering  patient?s activity barriers and safety.  ? ?Exercise Prescription Goal: ?Initial exercise prescription builds to 30-45 minutes a day of aerobic activity, 2-3 days per week.  Home exercise guidelines will be given to patient during program as part of exercise prescription that the participant will acknowledge. ? ? ?Education: Aerobic Exercise: ?- Group verbal and visual presentation on the components of exercise prescription. Introduces F.I.T.T principle from ACSM for exercise prescriptions.  Reviews F.I.T.T. principles of aerobic exercise including progression. Written material given at graduation. ?Flowsheet Row Cardiac Rehab from 10/20/2021 in [facility] Cardiac and Pulmonary Rehab  ?Education need identified 10/20/21  ? ?  ? ? ?Education: Resistance Exercise: ?- Group verbal and visual presentation on the components of exercise prescription. Introduces F.I.T.T principle from ACSM for exercise prescriptions  Reviews F.I.T.T. principles of resistance exercise including progression. Written material given at graduation. ? ?  ?Education: Exercise & Equipment Safety: ?- Individual verbal instruction and demonstration of equipment use and safety with use of the equipment. ?Flowsheet Row Cardiac Rehab from 10/20/2021 in [facility] Cardiac and Pulmonary  Rehab  ?Education need identified 10/20/21  ?Date 10/20/21  ?Educator KL  ?Instruction Review Code 1- Verbalizes Understanding  ? ?  ? ? ?Education: Exercise Physiology & General Exercise Guidelines: ?- Group v

## 2021-10-20 NOTE — Patient Instructions (Signed)
Patient Instructions ? ?Patient Details  ?Name: Katie Buckley ?MRN: [id number] ?Date of Birth: January 12, 1951 ?Referring Provider:  Yolonda Kida, MD ? ?Below are your personal goals for exercise, nutrition, and risk factors. Our goal is to help you stay on track towards obtaining and maintaining these goals. We will be discussing your progress on these goals with you throughout the program. ? ?Initial Exercise Prescription: ? Initial Exercise Prescription - 10/20/21 1100   ? ?  ? Date of Initial Exercise RX and Referring Provider  ? Date 10/20/21   ? Referring Provider Lujean Amel MD   ?  ? Oxygen  ? Maintain Oxygen Saturation 88% or higher   ?  ? Recumbant Bike  ? Level 1   ? RPM 60   ? Watts 6   ? Minutes 15   ? METs 1.6   ?  ? NuStep  ? Level 1   ? SPM 80   ? Minutes 15   ? METs 1.6   ?  ? Biostep-RELP  ? Level 1   ? SPM 50   ? Minutes 15   ? METs 1.6   ?  ? Track  ? Laps 10   ? Minutes 15   ? METs 1.54   ?  ? Prescription Details  ? Frequency (times per week) 3   ? Duration Progress to 30 minutes of continuous aerobic without signs/symptoms of physical distress   ?  ? Intensity  ? THRR 40-80% of Max Heartrate 94- 131   ? Ratings of Perceived Exertion 11-13   ? Perceived Dyspnea 0-4   ?  ? Progression  ? Progression Continue to progress workloads to maintain intensity without signs/symptoms of physical distress.   ?  ? Resistance Training  ? Training Prescription Yes   ? Weight 2 lb   ? Reps 10-15   ? ?  ?  ? ?  ? ? ?Exercise Goals: ?Frequency: Be able to perform aerobic exercise two to three times per week in program working toward 2-5 days per week of home exercise. ? ?Intensity: Work with a perceived exertion of 11 (fairly light) - 15 (hard) while following your exercise prescription.  We will make changes to your prescription with you as you progress through the program. ?  ?Duration: Be able to do 30 to 45 minutes of continuous aerobic exercise in addition to a 5 minute warm-up and a 5 minute  cool-down routine. ?  ?Nutrition Goals: ?Your personal nutrition goals will be established when you do your nutrition analysis with the dietician. ? ?The following are general nutrition guidelines to follow: ?Cholesterol < '200mg'$ /day ?Sodium < '1500mg'$ /day ?Fiber: Women over 50 yrs - 21 grams per day ? ?Personal Goals: ? Personal Goals and Risk Factors at Admission - 10/20/21 1135   ? ?  ? Core Components/Risk Factors/Patient Goals on Admission  ?  Weight Management Yes;Weight Loss   ? Intervention Weight Management: Develop a combined nutrition and exercise program designed to reach desired caloric intake, while maintaining appropriate intake of nutrient and fiber, sodium and fats, and appropriate energy expenditure required for the weight goal.;Weight Management: Provide education and appropriate resources to help participant work on and attain dietary goals.;Weight Management/Obesity: Establish reasonable short term and long term weight goals.   ? Admit Weight 142 lb (64.4 kg)   ? Goal Weight: Short Term 138 lb (62.6 kg)   ? Goal Weight: Long Term 132 lb (59.9 kg)   ? Expected Outcomes Short  Term: Continue to assess and modify interventions until short term weight is achieved;Long Term: Adherence to nutrition and physical activity/exercise program aimed toward attainment of established weight goal;Weight Loss: Understanding of general recommendations for a balanced deficit meal plan, which promotes 1-2 lb weight loss per week and includes a negative energy balance of [phone] kcal/d;Understanding recommendations for meals to include 15-35% energy as protein, 25-35% energy from fat, 35-60% energy from carbohydrates, less than '200mg'$  of dietary cholesterol, 20-35 gm of total fiber daily;Understanding of distribution of calorie intake throughout the day with the consumption of 4-5 meals/snacks   ? Diabetes Yes   ? Intervention Provide education about signs/symptoms and action to take for hypo/hyperglycemia.;Provide  education about proper nutrition, including hydration, and aerobic/resistive exercise prescription along with prescribed medications to achieve blood glucose in normal ranges: Fasting glucose 65-99 mg/dL   ? Expected Outcomes Short Term: Participant verbalizes understanding of the signs/symptoms and immediate care of hyper/hypoglycemia, proper foot care and importance of medication, aerobic/resistive exercise and nutrition plan for blood glucose control.;Long Term: Attainment of HbA1C < 7%.   ? Hypertension Yes   ? Intervention Provide education on lifestyle modifcations including regular physical activity/exercise, weight management, moderate sodium restriction and increased consumption of fresh fruit, vegetables, and low fat dairy, alcohol moderation, and smoking cessation.;Monitor prescription use compliance.   ? Expected Outcomes Short Term: Continued assessment and intervention until BP is < 140/40m HG in hypertensive participants. < 130/851mHG in hypertensive participants with diabetes, heart failure or chronic kidney disease.;Long Term: Maintenance of blood pressure at goal levels.   ? Lipids Yes   ? Intervention Provide education and support for participant on nutrition & aerobic/resistive exercise along with prescribed medications to achieve LDL '70mg'$ , HDL >'40mg'$ .   ? Expected Outcomes Short Term: Participant states understanding of desired cholesterol values and is compliant with medications prescribed. Participant is following exercise prescription and nutrition guidelines.;Long Term: Cholesterol controlled with medications as prescribed, with individualized exercise RX and with personalized nutrition plan. Value goals: LDL < '70mg'$ , HDL > 40 mg.   ? ?  ?  ? ?  ? ? ?Tobacco Use Initial Evaluation: ?Social History  ? ?Tobacco Use  ?Smoking Status Never  ?Smokeless Tobacco Never  ? ? ?Exercise Goals and Review: ? Exercise Goals   ? ? [place] 10/20/21 1134  ?  ?  ?  ?  ?  ? Exercise Goals  ? Increase  Physical Activity Yes      ? Intervention Provide advice, education, support and counseling about physical activity/exercise needs.;Develop an individualized exercise prescription for aerobic and resistive training based on initial evaluation findings, risk stratification, comorbidities and participant's personal goals.      ? Expected Outcomes Short Term: Attend rehab on a regular basis to increase amount of physical activity.;Long Term: Add in home exercise to make exercise part of routine and to increase amount of physical activity.;Long Term: Exercising regularly at least 3-5 days a week.      ? Increase Strength and Stamina Yes      ? Intervention Provide advice, education, support and counseling about physical activity/exercise needs.;Develop an individualized exercise prescription for aerobic and resistive training based on initial evaluation findings, risk stratification, comorbidities and participant's personal goals.      ? Expected Outcomes Short Term: Increase workloads from initial exercise prescription for resistance, speed, and METs.;Short Term: Perform resistance training exercises routinely during rehab and add in resistance training at home;Long Term: Improve cardiorespiratory fitness, muscular endurance and strength  as measured by increased METs and functional capacity (6MWT)      ? Able to understand and use rate of perceived exertion (RPE) scale Yes      ? Intervention Provide education and explanation on how to use RPE scale      ? Expected Outcomes Short Term: Able to use RPE daily in rehab to express subjective intensity level;Long Term:  Able to use RPE to guide intensity level when exercising independently      ? Able to understand and use Dyspnea scale Yes      ? Intervention Provide education and explanation on how to use Dyspnea scale      ? Expected Outcomes Short Term: Able to use Dyspnea scale daily in rehab to express subjective sense of shortness of breath during exertion;Long Term:  Able to use Dyspnea scale to guide intensity level when exercising independently      ? Knowledge and understanding of Target Heart Rate Range (THRR) Yes      ? Intervention Provide education and explanation of THRR i

## 2021-10-24 ENCOUNTER — Encounter: Payer: PPO | Admitting: *Deleted

## 2021-10-24 DIAGNOSIS — I5033 Acute on chronic diastolic (congestive) heart failure: Secondary | ICD-10-CM | POA: Diagnosis present

## 2021-10-24 DIAGNOSIS — R0600 Dyspnea, unspecified: Secondary | ICD-10-CM | POA: Diagnosis not present

## 2021-10-24 DIAGNOSIS — E1140 Type 2 diabetes mellitus with diabetic neuropathy, unspecified: Secondary | ICD-10-CM | POA: Diagnosis present

## 2021-10-24 DIAGNOSIS — J9690 Respiratory failure, unspecified, unspecified whether with hypoxia or hypercapnia: Secondary | ICD-10-CM | POA: Diagnosis not present

## 2021-10-24 DIAGNOSIS — G9341 Metabolic encephalopathy: Secondary | ICD-10-CM | POA: Diagnosis present

## 2021-10-24 DIAGNOSIS — R051 Acute cough: Secondary | ICD-10-CM | POA: Diagnosis present

## 2021-10-24 DIAGNOSIS — Z951 Presence of aortocoronary bypass graft: Secondary | ICD-10-CM

## 2021-10-24 DIAGNOSIS — J9811 Atelectasis: Secondary | ICD-10-CM | POA: Diagnosis present

## 2021-10-24 DIAGNOSIS — Z953 Presence of xenogenic heart valve: Secondary | ICD-10-CM | POA: Diagnosis not present

## 2021-10-24 DIAGNOSIS — B9789 Other viral agents as the cause of diseases classified elsewhere: Secondary | ICD-10-CM | POA: Diagnosis present

## 2021-10-24 DIAGNOSIS — I5031 Acute diastolic (congestive) heart failure: Secondary | ICD-10-CM | POA: Diagnosis not present

## 2021-10-24 DIAGNOSIS — R509 Fever, unspecified: Secondary | ICD-10-CM | POA: Diagnosis not present

## 2021-10-24 DIAGNOSIS — J441 Chronic obstructive pulmonary disease with (acute) exacerbation: Secondary | ICD-10-CM | POA: Diagnosis present

## 2021-10-24 DIAGNOSIS — I700 Atherosclerosis of aorta: Secondary | ICD-10-CM | POA: Diagnosis present

## 2021-10-24 DIAGNOSIS — I480 Paroxysmal atrial fibrillation: Secondary | ICD-10-CM | POA: Diagnosis present

## 2021-10-24 DIAGNOSIS — E669 Obesity, unspecified: Secondary | ICD-10-CM | POA: Diagnosis present

## 2021-10-24 DIAGNOSIS — J129 Viral pneumonia, unspecified: Secondary | ICD-10-CM | POA: Diagnosis present

## 2021-10-24 DIAGNOSIS — I5189 Other ill-defined heart diseases: Secondary | ICD-10-CM | POA: Diagnosis not present

## 2021-10-24 DIAGNOSIS — E782 Mixed hyperlipidemia: Secondary | ICD-10-CM | POA: Diagnosis not present

## 2021-10-24 DIAGNOSIS — J811 Chronic pulmonary edema: Secondary | ICD-10-CM | POA: Diagnosis not present

## 2021-10-24 DIAGNOSIS — R0902 Hypoxemia: Secondary | ICD-10-CM | POA: Diagnosis not present

## 2021-10-24 DIAGNOSIS — J302 Other seasonal allergic rhinitis: Secondary | ICD-10-CM | POA: Diagnosis present

## 2021-10-24 DIAGNOSIS — Z20822 Contact with and (suspected) exposure to covid-19: Secondary | ICD-10-CM | POA: Diagnosis present

## 2021-10-24 DIAGNOSIS — R110 Nausea: Secondary | ICD-10-CM | POA: Diagnosis not present

## 2021-10-24 DIAGNOSIS — R0602 Shortness of breath: Secondary | ICD-10-CM | POA: Diagnosis present

## 2021-10-24 DIAGNOSIS — G2401 Drug induced subacute dyskinesia: Secondary | ICD-10-CM | POA: Diagnosis present

## 2021-10-24 DIAGNOSIS — J440 Chronic obstructive pulmonary disease with acute lower respiratory infection: Secondary | ICD-10-CM | POA: Diagnosis present

## 2021-10-24 DIAGNOSIS — F419 Anxiety disorder, unspecified: Secondary | ICD-10-CM | POA: Diagnosis present

## 2021-10-24 DIAGNOSIS — J90 Pleural effusion, not elsewhere classified: Secondary | ICD-10-CM | POA: Diagnosis not present

## 2021-10-24 DIAGNOSIS — D638 Anemia in other chronic diseases classified elsewhere: Secondary | ICD-10-CM | POA: Diagnosis present

## 2021-10-24 DIAGNOSIS — E876 Hypokalemia: Secondary | ICD-10-CM | POA: Diagnosis not present

## 2021-10-24 DIAGNOSIS — B348 Other viral infections of unspecified site: Secondary | ICD-10-CM | POA: Diagnosis not present

## 2021-10-24 DIAGNOSIS — A419 Sepsis, unspecified organism: Secondary | ICD-10-CM | POA: Diagnosis present

## 2021-10-24 DIAGNOSIS — R059 Cough, unspecified: Secondary | ICD-10-CM | POA: Diagnosis not present

## 2021-10-24 DIAGNOSIS — J159 Unspecified bacterial pneumonia: Secondary | ICD-10-CM | POA: Diagnosis present

## 2021-10-24 DIAGNOSIS — I10 Essential (primary) hypertension: Secondary | ICD-10-CM | POA: Diagnosis not present

## 2021-10-24 DIAGNOSIS — R918 Other nonspecific abnormal finding of lung field: Secondary | ICD-10-CM | POA: Diagnosis not present

## 2021-10-24 DIAGNOSIS — Z952 Presence of prosthetic heart valve: Secondary | ICD-10-CM

## 2021-10-24 DIAGNOSIS — J189 Pneumonia, unspecified organism: Secondary | ICD-10-CM | POA: Diagnosis not present

## 2021-10-24 DIAGNOSIS — I110 Hypertensive heart disease with heart failure: Secondary | ICD-10-CM | POA: Diagnosis present

## 2021-10-24 DIAGNOSIS — J9601 Acute respiratory failure with hypoxia: Secondary | ICD-10-CM | POA: Diagnosis present

## 2021-10-24 DIAGNOSIS — R079 Chest pain, unspecified: Secondary | ICD-10-CM | POA: Diagnosis not present

## 2021-10-24 DIAGNOSIS — I2510 Atherosclerotic heart disease of native coronary artery without angina pectoris: Secondary | ICD-10-CM | POA: Diagnosis present

## 2021-10-24 DIAGNOSIS — K219 Gastro-esophageal reflux disease without esophagitis: Secondary | ICD-10-CM | POA: Diagnosis present

## 2021-10-24 LAB — GLUCOSE, CAPILLARY
Glucose-Capillary: 146 mg/dL — ABNORMAL HIGH (ref 70–99)
Glucose-Capillary: 143 mg/dL — ABNORMAL HIGH (ref 70–99)

## 2021-10-24 NOTE — Progress Notes (Signed)
Daily Session Note ? ?Patient Details  ?Name: Katie Buckley ?MRN: [id number] ?Date of Birth: July 31, 1950 ?Referring Provider:   ?Flowsheet Row Cardiac Rehab from 10/20/2021 in [facility]  ?Referring Provider Lujean Amel MD  ? ?  ? ? ?Encounter Date: 10/24/2021 ? ?Check In: ? Session Check In - 10/24/21 1015   ? ?  ? Check-In  ? Supervising physician immediately available to respond to emergencies See telemetry face sheet for immediately available ER MD   ? Location [facility] Pul[facility]   ? Staff Present Heath Lark, RN, BSN, Laveda Norman, BS, ACSM CEP, Exercise Physiologist;Kelly Rosalia Hammers, MPA, RN   ? Virtual Visit No   ? Medication changes reported     No   ? Fall or balance concerns reported    No   ? Warm-up and Cool-down Performed on first and last piece of equipment   ? Resistance Training Performed Yes   ? VAD Patient? No   ? PAD/SET Patient? No   ?  ? Pain Assessment  ? Currently in Pain? No/denies   ? ?  ?  ? ?  ? ? ? ? ? ?Social History  ? ?Tobacco Use  ?Smoking Status Never  ?Smokeless Tobacco Never  ? ? ?Goals Met:  ?Exercise tolerated well ?Personal goals reviewed ?No report of concerns or symptoms today ? ?Goals Unmet:  ?Not Applicable ? ?Comments: First full day of exercise!  Patient was oriented to gym and equipment including functions, settings, policies, and procedures.  Patient's individual exercise prescription and treatment plan were reviewed.  All starting workloads were established based on the results of the 6 minute walk test done at initial orientation visit.  The plan for exercise progression was also introduced and progression will be customized based on patient's performance and goals. ? ? ? ?Dr. Emily Filbert is Medical Director for [place].  ?Dr. Ottie Glazier is Medical Director for [facility]. ?

## 2021-10-25 ENCOUNTER — Other Ambulatory Visit: Payer: Self-pay

## 2021-10-25 ENCOUNTER — Encounter: Payer: Self-pay | Admitting: Emergency Medicine

## 2021-10-25 ENCOUNTER — Inpatient Hospital Stay
Admission: EM | Admit: 2021-10-25 | Discharge: 2021-11-02 | DRG: 871 | Disposition: A | Discharge status: Home / Self Care | Payer: PPO | Attending: Hospitalist | Admitting: Hospitalist

## 2021-10-25 ENCOUNTER — Ambulatory Visit
Admission: RE | Admit: 2021-10-25 | Discharge: 2021-10-25 | Disposition: A | Discharge status: Home / Self Care | Payer: PPO | Attending: Family Medicine | Admitting: Family Medicine

## 2021-10-25 ENCOUNTER — Emergency Department: Payer: PPO

## 2021-10-25 ENCOUNTER — Encounter: Payer: Self-pay | Admitting: Family Medicine

## 2021-10-25 ENCOUNTER — Ambulatory Visit (INDEPENDENT_AMBULATORY_CARE_PROVIDER_SITE_OTHER): Payer: PPO | Admitting: Family Medicine

## 2021-10-25 ENCOUNTER — Ambulatory Visit
Admission: RE | Admit: 2021-10-25 | Discharge: 2021-10-25 | Disposition: A | Discharge status: Home / Self Care | Payer: PPO | Source: Ambulatory Visit | Attending: Family Medicine | Admitting: Family Medicine

## 2021-10-25 VITALS — BP 158/71 | HR 67 | Temp 99.4°F | Resp 20 | Wt 143.0 lb

## 2021-10-25 DIAGNOSIS — Z85828 Personal history of other malignant neoplasm of skin: Secondary | ICD-10-CM

## 2021-10-25 DIAGNOSIS — I110 Hypertensive heart disease with heart failure: Secondary | ICD-10-CM | POA: Diagnosis present

## 2021-10-25 DIAGNOSIS — I2510 Atherosclerotic heart disease of native coronary artery without angina pectoris: Secondary | ICD-10-CM | POA: Diagnosis present

## 2021-10-25 DIAGNOSIS — Z953 Presence of xenogenic heart valve: Secondary | ICD-10-CM

## 2021-10-25 DIAGNOSIS — I480 Paroxysmal atrial fibrillation: Secondary | ICD-10-CM | POA: Diagnosis present

## 2021-10-25 DIAGNOSIS — D638 Anemia in other chronic diseases classified elsewhere: Secondary | ICD-10-CM | POA: Diagnosis present

## 2021-10-25 DIAGNOSIS — F419 Anxiety disorder, unspecified: Secondary | ICD-10-CM | POA: Diagnosis present

## 2021-10-25 DIAGNOSIS — Z9882 Breast implant status: Secondary | ICD-10-CM

## 2021-10-25 DIAGNOSIS — J9601 Acute respiratory failure with hypoxia: Secondary | ICD-10-CM | POA: Diagnosis present

## 2021-10-25 DIAGNOSIS — I700 Atherosclerosis of aorta: Secondary | ICD-10-CM | POA: Diagnosis present

## 2021-10-25 DIAGNOSIS — J189 Pneumonia, unspecified organism: Secondary | ICD-10-CM | POA: Diagnosis present

## 2021-10-25 DIAGNOSIS — R110 Nausea: Secondary | ICD-10-CM | POA: Diagnosis not present

## 2021-10-25 DIAGNOSIS — I5033 Acute on chronic diastolic (congestive) heart failure: Secondary | ICD-10-CM | POA: Diagnosis present

## 2021-10-25 DIAGNOSIS — R051 Acute cough: Secondary | ICD-10-CM | POA: Insufficient documentation

## 2021-10-25 DIAGNOSIS — T502X5A Adverse effect of carbonic-anhydrase inhibitors, benzothiadiazides and other diuretics, initial encounter: Secondary | ICD-10-CM | POA: Diagnosis not present

## 2021-10-25 DIAGNOSIS — J302 Other seasonal allergic rhinitis: Secondary | ICD-10-CM | POA: Diagnosis present

## 2021-10-25 DIAGNOSIS — T380X5A Adverse effect of glucocorticoids and synthetic analogues, initial encounter: Secondary | ICD-10-CM | POA: Diagnosis not present

## 2021-10-25 DIAGNOSIS — G2401 Drug induced subacute dyskinesia: Secondary | ICD-10-CM | POA: Diagnosis present

## 2021-10-25 DIAGNOSIS — R0602 Shortness of breath: Secondary | ICD-10-CM

## 2021-10-25 DIAGNOSIS — B9789 Other viral agents as the cause of diseases classified elsewhere: Secondary | ICD-10-CM | POA: Diagnosis present

## 2021-10-25 DIAGNOSIS — R059 Cough, unspecified: Secondary | ICD-10-CM | POA: Diagnosis not present

## 2021-10-25 DIAGNOSIS — I10 Essential (primary) hypertension: Secondary | ICD-10-CM | POA: Diagnosis present

## 2021-10-25 DIAGNOSIS — J441 Chronic obstructive pulmonary disease with (acute) exacerbation: Secondary | ICD-10-CM | POA: Diagnosis present

## 2021-10-25 DIAGNOSIS — J440 Chronic obstructive pulmonary disease with acute lower respiratory infection: Secondary | ICD-10-CM | POA: Diagnosis present

## 2021-10-25 DIAGNOSIS — Z7985 Long-term (current) use of injectable non-insulin antidiabetic drugs: Secondary | ICD-10-CM

## 2021-10-25 DIAGNOSIS — E876 Hypokalemia: Secondary | ICD-10-CM | POA: Diagnosis not present

## 2021-10-25 DIAGNOSIS — I5189 Other ill-defined heart diseases: Secondary | ICD-10-CM | POA: Diagnosis not present

## 2021-10-25 DIAGNOSIS — A419 Sepsis, unspecified organism: Secondary | ICD-10-CM | POA: Diagnosis present

## 2021-10-25 DIAGNOSIS — Z7984 Long term (current) use of oral hypoglycemic drugs: Secondary | ICD-10-CM

## 2021-10-25 DIAGNOSIS — Z981 Arthrodesis status: Secondary | ICD-10-CM

## 2021-10-25 DIAGNOSIS — Z20822 Contact with and (suspected) exposure to covid-19: Secondary | ICD-10-CM | POA: Diagnosis present

## 2021-10-25 DIAGNOSIS — G9341 Metabolic encephalopathy: Secondary | ICD-10-CM | POA: Diagnosis present

## 2021-10-25 DIAGNOSIS — J159 Unspecified bacterial pneumonia: Secondary | ICD-10-CM | POA: Diagnosis present

## 2021-10-25 DIAGNOSIS — E1165 Type 2 diabetes mellitus with hyperglycemia: Secondary | ICD-10-CM | POA: Diagnosis not present

## 2021-10-25 DIAGNOSIS — B348 Other viral infections of unspecified site: Secondary | ICD-10-CM | POA: Diagnosis not present

## 2021-10-25 DIAGNOSIS — Z961 Presence of intraocular lens: Secondary | ICD-10-CM | POA: Diagnosis present

## 2021-10-25 DIAGNOSIS — J129 Viral pneumonia, unspecified: Secondary | ICD-10-CM | POA: Diagnosis present

## 2021-10-25 DIAGNOSIS — E669 Obesity, unspecified: Secondary | ICD-10-CM | POA: Diagnosis present

## 2021-10-25 DIAGNOSIS — K219 Gastro-esophageal reflux disease without esophagitis: Secondary | ICD-10-CM | POA: Diagnosis present

## 2021-10-25 DIAGNOSIS — E1140 Type 2 diabetes mellitus with diabetic neuropathy, unspecified: Secondary | ICD-10-CM | POA: Diagnosis present

## 2021-10-25 DIAGNOSIS — Z79899 Other long term (current) drug therapy: Secondary | ICD-10-CM

## 2021-10-25 DIAGNOSIS — J90 Pleural effusion, not elsewhere classified: Secondary | ICD-10-CM | POA: Diagnosis not present

## 2021-10-25 DIAGNOSIS — Z6825 Body mass index (BMI) 25.0-25.9, adult: Secondary | ICD-10-CM

## 2021-10-25 DIAGNOSIS — Z91148 Patient's other noncompliance with medication regimen for other reason: Secondary | ICD-10-CM

## 2021-10-25 DIAGNOSIS — Z9049 Acquired absence of other specified parts of digestive tract: Secondary | ICD-10-CM

## 2021-10-25 DIAGNOSIS — J9811 Atelectasis: Secondary | ICD-10-CM | POA: Diagnosis present

## 2021-10-25 DIAGNOSIS — D649 Anemia, unspecified: Secondary | ICD-10-CM | POA: Diagnosis present

## 2021-10-25 DIAGNOSIS — J188 Other pneumonia, unspecified organism: Secondary | ICD-10-CM | POA: Diagnosis present

## 2021-10-25 DIAGNOSIS — J301 Allergic rhinitis due to pollen: Secondary | ICD-10-CM

## 2021-10-25 DIAGNOSIS — Z7982 Long term (current) use of aspirin: Secondary | ICD-10-CM

## 2021-10-25 DIAGNOSIS — Z833 Family history of diabetes mellitus: Secondary | ICD-10-CM

## 2021-10-25 DIAGNOSIS — E785 Hyperlipidemia, unspecified: Secondary | ICD-10-CM | POA: Diagnosis present

## 2021-10-25 DIAGNOSIS — Z79891 Long term (current) use of opiate analgesic: Secondary | ICD-10-CM

## 2021-10-25 DIAGNOSIS — Z881 Allergy status to other antibiotic agents status: Secondary | ICD-10-CM

## 2021-10-25 DIAGNOSIS — Z951 Presence of aortocoronary bypass graft: Secondary | ICD-10-CM

## 2021-10-25 DIAGNOSIS — R0902 Hypoxemia: Secondary | ICD-10-CM

## 2021-10-25 DIAGNOSIS — Z8614 Personal history of Methicillin resistant Staphylococcus aureus infection: Secondary | ICD-10-CM

## 2021-10-25 DIAGNOSIS — R0600 Dyspnea, unspecified: Secondary | ICD-10-CM | POA: Diagnosis not present

## 2021-10-25 LAB — RESP PANEL BY RT-PCR (FLU A&B, COVID) ARPGX2
Influenza A by PCR: NEGATIVE
Influenza B by PCR: NEGATIVE
SARS Coronavirus 2 by RT PCR: NEGATIVE

## 2021-10-25 LAB — CBC WITH DIFFERENTIAL/PLATELET
Abs Immature Granulocytes: 0.03 10*3/uL (ref 0.00–0.07)
Basophils Absolute: 0 10*3/uL (ref 0.0–0.1)
Basophils Relative: 0 %
Eosinophils Absolute: 0.1 10*3/uL (ref 0.0–0.5)
Eosinophils Relative: 1 %
HCT: 33.2 % — ABNORMAL LOW (ref 36.0–46.0)
Hemoglobin: 10.3 g/dL — ABNORMAL LOW (ref 12.0–15.0)
Immature Granulocytes: 0 %
Lymphocytes Relative: 9 %
Lymphs Abs: 0.8 10*3/uL (ref 0.7–4.0)
MCH: 24.7 pg — ABNORMAL LOW (ref 26.0–34.0)
MCHC: 31 g/dL (ref 30.0–36.0)
MCV: 79.6 fL — ABNORMAL LOW (ref 80.0–100.0)
Monocytes Absolute: 0.7 10*3/uL (ref 0.1–1.0)
Monocytes Relative: 9 %
Neutro Abs: 6.5 10*3/uL (ref 1.7–7.7)
Neutrophils Relative %: 81 %
Platelets: 185 10*3/uL (ref 150–400)
RBC: 4.17 MIL/uL (ref 3.87–5.11)
RDW: 16.1 % — ABNORMAL HIGH (ref 11.5–15.5)
WBC: 8.2 10*3/uL (ref 4.0–10.5)
nRBC: 0 % (ref 0.0–0.2)

## 2021-10-25 LAB — LACTIC ACID, PLASMA
Lactic Acid, Venous: 0.9 mmol/L (ref 0.5–1.9)
Lactic Acid, Venous: 0.8 mmol/L (ref 0.5–1.9)

## 2021-10-25 LAB — CBG MONITORING, ED: Glucose-Capillary: 82 mg/dL (ref 70–99)

## 2021-10-25 LAB — COMPREHENSIVE METABOLIC PANEL
ALT: 17 U/L (ref 0–44)
AST: 28 U/L (ref 15–41)
Albumin: 3.8 g/dL (ref 3.5–5.0)
Alkaline Phosphatase: 99 U/L (ref 38–126)
Anion gap: 9 (ref 5–15)
BUN: 13 mg/dL (ref 8–23)
CO2: 26 mmol/L (ref 22–32)
Calcium: 9.2 mg/dL (ref 8.9–10.3)
Chloride: 103 mmol/L (ref 98–111)
Creatinine, Ser: 0.61 mg/dL (ref 0.44–1.00)
GFR, Estimated: >60 mL/min (ref >60)
Glucose, Bld: 142 mg/dL — ABNORMAL HIGH (ref 70–99)
Potassium: 3.7 mmol/L (ref 3.5–5.1)
Sodium: 138 mmol/L (ref 135–145)
Total Bilirubin: 0.5 mg/dL (ref 0.3–1.2)
Total Protein: 7 g/dL (ref 6.5–8.1)

## 2021-10-25 LAB — APTT: aPTT: 30 seconds (ref 24–36)

## 2021-10-25 LAB — PROTIME-INR
INR: 1.1 (ref 0.8–1.2)
Prothrombin Time: 14.1 seconds (ref 11.4–15.2)

## 2021-10-25 LAB — BRAIN NATRIURETIC PEPTIDE: B Natriuretic Peptide: 552.6 pg/mL — ABNORMAL HIGH (ref 0.0–100.0)

## 2021-10-25 MED ORDER — EZETIMIBE 10 MG PO TABS
10.0000 mg | ORAL_TABLET | Freq: Every day | ORAL | Status: DC
  Administered 2021-10-26 – 2021-11-01 (×6): 10 mg via ORAL
  Filled 2021-10-25 (×6): qty 1

## 2021-10-25 MED ORDER — IRBESARTAN 150 MG PO TABS
75.0000 mg | ORAL_TABLET | Freq: Every day | ORAL | Status: DC
  Administered 2021-10-26 – 2021-11-02 (×8): 75 mg via ORAL
  Filled 2021-10-25 (×9): qty 1

## 2021-10-25 MED ORDER — LACTATED RINGERS IV SOLN: INTRAVENOUS | Status: AC

## 2021-10-25 MED ORDER — METOPROLOL TARTRATE 50 MG PO TABS
50.0000 mg | ORAL_TABLET | Freq: Two times a day (BID) | ORAL | Status: DC
Start: 2021-10-26 — End: 2021-11-02
  Administered 2021-10-26 – 2021-11-02 (×14): 50 mg via ORAL
  Filled 2021-10-25 (×14): qty 1

## 2021-10-25 MED ORDER — PROMETHAZINE HCL 25 MG PO TABS: 25.0000 mg | ORAL_TABLET | Freq: Three times a day (TID) | ORAL | 0 refills | Status: DC | PRN

## 2021-10-25 MED ORDER — SODIUM CHLORIDE 0.9 % IV SOLN
500.0000 mg | INTRAVENOUS | Status: AC
  Administered 2021-10-26 – 2021-10-30 (×4): 500 mg via INTRAVENOUS
  Filled 2021-10-25 (×4): qty 500

## 2021-10-25 MED ORDER — ASPIRIN 325 MG PO TBEC
325.0000 mg | DELAYED_RELEASE_TABLET | Freq: Every day | ORAL | Status: DC
Start: 2021-10-26 — End: 2021-11-02
  Administered 2021-10-26 – 2021-11-01 (×7): 325 mg via ORAL
  Filled 2021-10-25 (×8): qty 1

## 2021-10-25 MED ORDER — EMPAGLIFLOZIN 25 MG PO TABS
25.0000 mg | ORAL_TABLET | Freq: Every day | ORAL | Status: DC
  Administered 2021-10-26 – 2021-11-02 (×7): 25 mg via ORAL
  Filled 2021-10-25 (×8): qty 1

## 2021-10-25 MED ORDER — PANTOPRAZOLE SODIUM 40 MG PO TBEC
40.0000 mg | DELAYED_RELEASE_TABLET | Freq: Every day | ORAL | Status: DC
  Administered 2021-10-26 – 2021-11-02 (×8): 40 mg via ORAL
  Filled 2021-10-25 (×8): qty 1

## 2021-10-25 MED ORDER — LACTATED RINGERS IV SOLN: INTRAVENOUS | Status: DC

## 2021-10-25 MED ORDER — PROMETHAZINE HCL 50 MG/ML IJ SOLN: 25.0000 mg | Freq: Once | INTRAMUSCULAR | Status: DC

## 2021-10-25 MED ORDER — SODIUM CHLORIDE 0.9 % IV SOLN
2.0000 g | INTRAVENOUS | Status: DC
  Administered 2021-10-25: 2 g via INTRAVENOUS
  Filled 2021-10-25: qty 20

## 2021-10-25 MED ORDER — SODIUM CHLORIDE 0.9 % IV SOLN
500.0000 mg | INTRAVENOUS | Status: DC
  Administered 2021-10-25: 500 mg via INTRAVENOUS
  Filled 2021-10-25: qty 5

## 2021-10-25 MED ORDER — ACETAMINOPHEN 500 MG PO TABS
1000.0000 mg | ORAL_TABLET | Freq: Three times a day (TID) | ORAL | Status: DC | PRN
  Administered 2021-10-26 – 2021-10-31 (×7): 1000 mg via ORAL
  Filled 2021-10-25 (×7): qty 2

## 2021-10-25 MED ORDER — PROMETHAZINE HCL 25 MG/ML IJ SOLN: 25.0000 mg | Freq: Once | INTRAMUSCULAR | Status: DC

## 2021-10-25 MED ORDER — LACTATED RINGERS IV BOLUS
1000.0000 mL | Freq: Once | INTRAVENOUS | Status: AC
  Administered 2021-10-25: 1000 mL via INTRAVENOUS

## 2021-10-25 MED ORDER — AMLODIPINE BESYLATE 10 MG PO TABS
10.0000 mg | ORAL_TABLET | Freq: Every day | ORAL | Status: DC
  Administered 2021-10-26 – 2021-11-02 (×8): 10 mg via ORAL
  Filled 2021-10-25 (×8): qty 1

## 2021-10-25 MED ORDER — INSULIN ASPART 100 UNIT/ML IJ SOLN
0.0000 [IU] | Freq: Every day | INTRAMUSCULAR | Status: DC
  Administered 2021-10-31: 3 [IU] via SUBCUTANEOUS
  Administered 2021-11-01: 4 [IU] via SUBCUTANEOUS
  Filled 2021-10-25 (×2): qty 1

## 2021-10-25 MED ORDER — ENOXAPARIN SODIUM 40 MG/0.4ML IJ SOSY
40.0000 mg | PREFILLED_SYRINGE | INTRAMUSCULAR | Status: DC
  Administered 2021-10-26 – 2021-11-01 (×7): 40 mg via SUBCUTANEOUS
  Filled 2021-10-25 (×8): qty 0.4

## 2021-10-25 MED ORDER — ROSUVASTATIN CALCIUM 10 MG PO TABS
20.0000 mg | ORAL_TABLET | Freq: Every day | ORAL | Status: DC
  Administered 2021-10-26 – 2021-11-02 (×8): 20 mg via ORAL
  Filled 2021-10-25 (×6): qty 2
  Filled 2021-10-25: qty 1
  Filled 2021-10-25: qty 2

## 2021-10-25 MED ORDER — INSULIN ASPART 100 UNIT/ML IJ SOLN
0.0000 [IU] | Freq: Three times a day (TID) | INTRAMUSCULAR | Status: DC
  Administered 2021-10-26: 2 [IU] via SUBCUTANEOUS
  Administered 2021-10-27: 3 [IU] via SUBCUTANEOUS
  Administered 2021-10-27 – 2021-10-28 (×4): 2 [IU] via SUBCUTANEOUS
  Administered 2021-10-28: 8 [IU] via SUBCUTANEOUS
  Administered 2021-10-29: 11 [IU] via SUBCUTANEOUS
  Administered 2021-10-29: 8 [IU] via SUBCUTANEOUS
  Administered 2021-10-29 – 2021-10-30 (×2): 2 [IU] via SUBCUTANEOUS
  Administered 2021-10-30 (×2): 5 [IU] via SUBCUTANEOUS
  Administered 2021-10-31: 3 [IU] via SUBCUTANEOUS
  Administered 2021-10-31: 5 [IU] via SUBCUTANEOUS
  Administered 2021-10-31: 3 [IU] via SUBCUTANEOUS
  Administered 2021-11-01: 15 [IU] via SUBCUTANEOUS
  Administered 2021-11-01: 11 [IU] via SUBCUTANEOUS
  Filled 2021-10-25 (×20): qty 1

## 2021-10-25 MED ORDER — AMIODARONE HCL 200 MG PO TABS
200.0000 mg | ORAL_TABLET | Freq: Every day | ORAL | Status: DC
  Administered 2021-10-26: 200 mg via ORAL
  Filled 2021-10-25: qty 1

## 2021-10-25 MED ORDER — FUROSEMIDE 40 MG PO TABS: 40.0000 mg | ORAL_TABLET | Freq: Every day | ORAL | Status: DC

## 2021-10-25 MED ORDER — ACETAMINOPHEN 500 MG PO TABS
1000.0000 mg | ORAL_TABLET | Freq: Once | ORAL | Status: AC
  Administered 2021-10-25: 1000 mg via ORAL
  Filled 2021-10-25: qty 2

## 2021-10-25 MED ORDER — CEFDINIR 300 MG PO CAPS: 600.0000 mg | ORAL_CAPSULE | Freq: Every day | ORAL | 0 refills | Status: DC

## 2021-10-25 MED ORDER — SODIUM CHLORIDE 0.9 % IV SOLN
2.0000 g | INTRAVENOUS | Status: AC
  Administered 2021-10-26 – 2021-10-30 (×4): 2 g via INTRAVENOUS
  Filled 2021-10-25 (×4): qty 2
  Filled 2021-10-25: qty 20

## 2021-10-25 NOTE — Assessment & Plan Note (Addendum)
Continue amiodarone and ASA 325. ? Not currently on anticoagulation likely as A-fib occurred 1 time in the postop period And rapidly converted to sinus with amiodarone bolus ?

## 2021-10-25 NOTE — Assessment & Plan Note (Addendum)
Patient was not overtly decompensated on admission, however BNP elevated to over 500.  She was treated with IV fluids per sepsis protocol, subsequently developed worsening hypoxic respiratory failure requiring BiPAP --Cardiology following -- Treated with IV Lasix 40 mg twice daily (stopped 5/21) --Resume oral Lasix 40 mg daily (5/21) --Strict I/O's and daily weights --Monitor renal function and electrolytes --Continue Avapro, metoprolol, Jardiance -- Low-sodium diet fluid restriction  Echo 5/18 with preserved EF, grade 2 diastolic dysfunction.

## 2021-10-25 NOTE — Assessment & Plan Note (Signed)
Blood pressure under fair control.  Continue amlodipine and irbesartan and metoprolol ?

## 2021-10-25 NOTE — ED Triage Notes (Signed)
Pt arrived via [facility] from home with SOB x2 weeks. Per EMS, pt with fever of 102F and room air oxygen sat at 86%. Pt arrived to ED on 4L. Pt poor historian and confused on situation and time. Family reported to EMS that pt was prescribed an antibiotic recently but has not been taking accordingly. Pt seen at PCP today and chest x-ray performed. Fever of 102.92F on arrival, 89% oxygen sat on room air. Pt placed on 2L oxygen by nasal canula.  ?

## 2021-10-25 NOTE — H&P (Signed)
?History and Physical  ? ? ?Patient: Katie Buckley HRC:[id number] DOB: 02/16/1951 ?DOA: 10/25/2021 ?DOS: the patient was seen and examined on 10/25/2021 ?PCP: [name], MD  ?Patient coming from: Home ? ?Chief Complaint:  ?Chief Complaint  ?Patient presents with  ? Shortness of Breath  ? ? ?HPI: [place] is a 71 y.o. 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  She denies chest pain.  She was prescribed amoxicillin by her cardiothoracic surgeon a few days prior but without improvement and her PCP ordered Omnicef a couple days ago but patient has not been taking it as prescribed PCP a few days prior on arrival of EMS O2 sat was 86% improving to the 90s on 4 L. ?ED course and data review: On arrival temperature 102.6 with pulse 71, respirations 34, BP 154/85 and O2 sat 89% on room air improving to 96% on 2 L.  Labs with normal WBC of 8200 and lactic acid 0.8.  Hemoglobin 10.3.  CMP unremarkable, BNP 552.  COVID and flu negative.  Chest x-ray shows bibasilar airspace opacities and small left effusion slight Lee increased in the interval from prior exam earlier on the same day. ?Patient given an IV fluid bolus started on Rocephin and azithromycin Hospitalist consulted for admission.  ? ?Review of Systems: As mentioned in the history of present illness. All other systems reviewed and are negative. ?Past Medical History:  ?Diagnosis Date  ? Anxiety   ? Aortic stenosis 01/21/2013  ? 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 Frequent sinus infections   ? Headache   ? Tension headaches  ? History of Barrett's esophagus   ? Hyperchloremia   ? MRSA (methicillin resistant staph aureus) culture positive 2018   ? history of, in hand  ? Palpitations   ? Seasonal allergies   ? Skin cancer   ? 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 ?Past Surgical History:  ?Procedure Laterality Date  ? ANTERIOR CERVICAL DECOMP/DISCECTOMY FUSION N/A 11/15/2020  ? Procedure: ANTERIOR CERVICAL DECOMPRESSION/DISCECTOMY FUSION 1 LEVEL C3/4;  Surgeon: Deetta Perla, MD;  Location: [facility];  Service: Neurosurgery;  Laterality: N/A;  ? AORTIC VALVE REPLACEMENT N/A 09/19/2021  ? Procedure: AORTIC VALVE REPLACEMENT Using 39m Edwards Resilia Inspiris Valve;  Surgeon: BGaye Pollack MD;  [place]  Service: Open Heart Surgery;  Laterality: N/A;  ? AUGMENTATION MAMMAPLASTY Bilateral   ? breast implants  ? Bone Spur  2007  ? foot  ? CARDIAC CATHETERIZATION    ? Carpal Tunnel Symdrome  2008  ? as repeated in 2009  ? CATARACT EXTRACTION W/PHACO Left 01/16/2019  ? Procedure: CATARACT EXTRACTION PHACO AND INTRAOCULAR LENS PLACEMENT ([place] LEFT DIABETES VISION BLUE;  Surgeon: HMarchia Meiers MD;  Location: [facility];  Service: Ophthalmology;  Laterality: Left;  [place] 00:47 ?CDE 6.62 ?Fluid pack lot # [id number]  ? CATARACT EXTRACTION W/PHACO Right 02/06/2019  ? Procedure: CATARACT EXTRACTION PHACO AND INTRAOCULAR LENS PLACEMENT (IOC);  Surgeon: HMarchia Meiers MD;  Location: [facility];  Service: Ophthalmology;  Laterality: Right;  [place]00:54.6 ?CDE 6.52 ?FLUID PACK LOT # [id number]  ? CHOLECYSTECTOMY  2007  ? COLONOSCOPY WITH PROPOFOL N/A 11/17/2015  ? Procedure: COLONOSCOPY  WITH PROPOFOL;  Surgeon: Manya Silvas, MD;  Location: [facility] ENDOSCOPY;  Service: Endoscopy;  Laterality: N/A;  ? CORONARY ARTERY BYPASS GRAFT N/A 09/19/2021  ? Procedure: CORONARY ARTERY BYPASS GRAFTING X1 Using Left Internal Mammory artery;  Surgeon: Gaye Pollack, MD;  Location: [place];  Service: Open Heart Surgery;  Laterality: N/A;  ? ESOPHAGOGASTRODUODENOSCOPY (EGD) WITH PROPOFOL N/A 11/17/2015  ? Procedure: ESOPHAGOGASTRODUODENOSCOPY (EGD) WITH PROPOFOL;  Surgeon: Manya Silvas, MD;   Location: [facility] ENDOSCOPY;  Service: Endoscopy;  Laterality: N/A;  ? EYE SURGERY Bilateral   ? cataract extraction  ? FOOT SURGERY    ? INTRAVASCULAR PRESSURE WIRE/FFR STUDY N/A 05/24/2021  ? Procedure: INTRAVASCULAR PRESSURE WIRE/FFR STUDY;  Surgeon: Yolonda Kida, MD;  Location: [place] CV LAB;  Service: Cardiovascular;  Laterality: N/A;  ? MANDIBLE SURGERY    ? NASAL SINUS SURGERY  03/2013  ? Multile surgery Dr. Carlis Abbott. 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 Release of Trigger Finger Right 10/2010  ? Dr. Tamala Julian  ? RIGHT/LEFT HEART CATH AND CORONARY ANGIOGRAPHY N/A 05/24/2021  ? Procedure: RIGHT/LEFT HEART CATH AND CORONARY ANGIOGRAPHY;  Surgeon: Yolonda Kida, MD;  Location: [place] [facility];  Service: Cardiovascular;  Laterality: N/A;  ? TEE WITHOUT CARDIOVERSION N/A 09/19/2021  ? Procedure: TRANSESOPHAGEAL ECHOCARDIOGRAM (TEE);  Surgeon: Gaye Pollack, MD;  Location: [place];  Service: Open Heart Surgery;  Laterality: N/A;  ? TONSILLECTOMY  1960  ? ?Social History:  reports that she has never smoked. She has never used smokeless tobacco. She reports that she does not drink alcohol and does not use drugs. ? ?Allergies  ?Allergen Reactions  ? Doxycycline Other (See Comments)  ?  Emotional changes/anxious  ? Biaxin [Clarithromycin]   ?  GI upset, and bad taste in mouth.  ? ? ?Family History  ?Problem Relation Age of Onset  ? Diabetes Father   ? Lung cancer Mother   ? Breast cancer Maternal Aunt   ? Irritable bowel syndrome Sister   ? Colon cancer Brother   ? ? ?Prior to Admission medications   ?Medication Sig Start Date End Date Taking? Authorizing Provider  ?acetaminophen (TYLENOL) 650 MG CR tablet Take 1,300 mg by mouth every 8 (eight) hours as needed for pain (pain).    [provider]  ?amiodarone (PACERONE) 200 MG tablet Take 1 tablet (200 mg total) by mouth 2 (two) times daily. For 7 days then take 200 mg daily thereafter 09/24/21   Nani Skillern, PA-C  ?amLODipine (NORVASC) 10  MG tablet Take 1 tablet (10 mg total) by mouth daily. 09/24/21   Nani Skillern, PA-C  ?aspirin EC 325 MG EC tablet Take 1 tablet (325 mg total) by mouth daily. 09/24/21   Nani Skillern, PA-C  ?buPROPion (WELLBUTRIN SR) 150 MG 12 hr tablet Take 150 mg by mouth 2 (two) times daily. 05/25/20   [provider]  ?butalbital-acetaminophen-caffeine (FIORICET) 50-325-40 MG tablet TAKE 1 TABLET BY MOUTH EVERY 6 HOURS AS NEEDED FOR HEADACHE ?Patient taking differently: Take 1 tablet by mouth every 6 (six) hours as needed for headache. 05/23/21   [name], MD  ?cefdinir (OMNICEF) 300 MG capsule Take 2 capsules (600 mg total) by mouth daily for 7 days. 10/25/21 11/01/21  [name], MD  ?ezetimibe (ZETIA) 10 MG tablet TAKE 1 TABLET BY MOUTH EVERYDAY AT BEDTIME ?Patient taking differently: Take 10 mg by mouth at bedtime. 07/07/20   [name], MD  ?fexofenadine (ALLEGRA) 180 MG  tablet Take 180 mg by mouth daily as needed for allergies or rhinitis.    [provider]  ?fluconazole (DIFLUCAN) 150 MG tablet Take one tablet on day you receive medication Take second tablet three days later Take third and last tablet three days later 01/26/21   Gwyneth Sprout, FNP  ?furosemide (LASIX) 40 MG tablet Take 1 tablet (40 mg total) by mouth daily. For 5 days then stop. 09/25/21   Nani Skillern, PA-C  ?glipiZIDE (GLUCOTROL) 10 MG tablet TAKE 1 TABLET BY MOUTH EVERY DAY BEFORE BREAKFAST ?Patient taking differently: Take 10 mg by mouth daily before breakfast. 05/30/21   [name], MD  ?hydrOXYzine (VISTARIL) 25 MG capsule Take 25 mg by mouth daily as needed for anxiety. 10/05/20   [provider]  ?irbesartan (AVAPRO) 75 MG tablet TAKE 1 TABLET BY MOUTH EVERY DAY 09/28/21   [name], MD  ?JARDIANCE 25 MG TABS tablet TAKE 1 TABLET (25 MG TOTAL) BY MOUTH DAILY. ?Patient taking differently: 25 mg daily. 07/18/21   [name], MD  ?[place]     [provider]  ?loperamide (IMODIUM) 2 MG capsule Take 2-4 mg by mouth daily as needed for diarrhea or loose stools.    [provider]  ?metFORMIN (GLUCOPHAGE-XR) 500 MG 24 hr tablet Take 2 tab

## 2021-10-25 NOTE — Progress Notes (Signed)
I,Roshena L Chambers,acting as a scribe for Lelon Huh, MD.,have documented all relevant documentation on the behalf of Lelon Huh, MD,as directed by  Lelon Huh, MD while in the presence of Lelon Huh, MD.   Established patient visit   Patient: Katie Buckley   DOB: 12-13-1950   71 y.o. Female  MRN: [id number] Visit Date: 10/25/2021  Today's healthcare provider: Lelon Huh, MD   Chief Complaint  Patient presents with   Sore Throat   Subjective    Sore Throat  This is a new problem. The current episode started yesterday. Associated symptoms include congestion (sinus congestion), coughing (dry), ear pain, headaches, a plugged ear sensation and shortness of breath. Pertinent negatives include no abdominal pain or vomiting. Treatments tried: Cough drops.   Feels very fatigued and achy.  Home covid test was done while patient was in the office which was negtive.   Medications: Outpatient Medications Prior to Visit  Medication Sig   acetaminophen (TYLENOL) 650 MG CR tablet Take 1,300 mg by mouth every 8 (eight) hours as needed for pain (pain).   amiodarone (PACERONE) 200 MG tablet Take 1 tablet (200 mg total) by mouth 2 (two) times daily. For 7 days then take 200 mg daily thereafter   amLODipine (NORVASC) 10 MG tablet Take 1 tablet (10 mg total) by mouth daily.   aspirin EC 325 MG EC tablet Take 1 tablet (325 mg total) by mouth daily.   buPROPion (WELLBUTRIN SR) 150 MG 12 hr tablet Take 150 mg by mouth 2 (two) times daily.   butalbital-acetaminophen-caffeine (FIORICET) 50-325-40 MG tablet TAKE 1 TABLET BY MOUTH EVERY 6 HOURS AS NEEDED FOR HEADACHE (Patient taking differently: Take 1 tablet by mouth every 6 (six) hours as needed for headache.)   ezetimibe (ZETIA) 10 MG tablet TAKE 1 TABLET BY MOUTH EVERYDAY AT BEDTIME (Patient taking differently: Take 10 mg by mouth at bedtime.)   fexofenadine (ALLEGRA) 180 MG tablet Take 180 mg by mouth daily as needed for allergies  or rhinitis.   fluconazole (DIFLUCAN) 150 MG tablet Take one tablet on day you receive medication Take second tablet three days later Take third and last tablet three days later   furosemide (LASIX) 40 MG tablet Take 1 tablet (40 mg total) by mouth daily. For 5 days then stop.   glipiZIDE (GLUCOTROL) 10 MG tablet TAKE 1 TABLET BY MOUTH EVERY DAY BEFORE BREAKFAST (Patient taking differently: Take 10 mg by mouth daily before breakfast.)   hydrOXYzine (VISTARIL) 25 MG capsule Take 25 mg by mouth daily as needed for anxiety.   irbesartan (AVAPRO) 75 MG tablet TAKE 1 TABLET BY MOUTH EVERY DAY   JARDIANCE 25 MG TABS tablet TAKE 1 TABLET (25 MG TOTAL) BY MOUTH DAILY. (Patient taking differently: 25 mg daily.)   Lancet Devices MISC    loperamide (IMODIUM) 2 MG capsule Take 2-4 mg by mouth daily as needed for diarrhea or loose stools.   metFORMIN (GLUCOPHAGE-XR) 500 MG 24 hr tablet Take 2 tablets (1,000 mg total) by mouth daily.   methocarbamol (ROBAXIN) 500 MG tablet Take 1 tablet (500 mg total) by mouth every 6 (six) hours as needed for muscle spasms.   metoprolol tartrate (LOPRESSOR) 50 MG tablet Take 1 tablet (50 mg total) by mouth 2 (two) times daily.   montelukast (SINGULAIR) 10 MG tablet TAKE 1 TABLET BY MOUTH EVERY DAY (Patient taking differently: Take 10 mg by mouth at bedtime as needed (allergies).)   Olopatadine HCl 0.7 %  SOLN Place 1 drop into both eyes daily as needed (allergies).   ondansetron (ZOFRAN ODT) 4 MG disintegrating tablet Take 1 tablet (4 mg total) by mouth every 8 (eight) hours as needed for nausea or vomiting.   pregabalin (LYRICA) 100 MG capsule Take 100 mg by mouth 3 (three) times daily.   RABEprazole (ACIPHEX) 20 MG tablet Take 1 tablet (20 mg total) by mouth daily.   rosuvastatin (CRESTOR) 20 MG tablet TAKE 1 TABLET BY MOUTH EVERY DAY IN THE EVENING (Patient taking differently: Take 20 mg by mouth daily.)   sertraline (ZOLOFT) 100 MG tablet Take 200 mg by mouth daily.    simethicone (MYLICON) 80 MG chewable tablet Chew 80 mg by mouth every 6 (six) hours as needed for flatulence.   tirzepatide [facility]) 5 MG/0.5ML Pen Inject 5 mg into the skin once a week.   traZODone (DESYREL) 50 MG tablet Take 50-100 mg by mouth at bedtime as needed for sleep.    valACYclovir (VALTREX) 1000 MG tablet Take 1 tablet daily for 5 days as needed   No facility-administered medications prior to visit.    Review of Systems  Constitutional:  Positive for chills and fatigue. Negative for appetite change and fever.  HENT:  Positive for congestion (sinus congestion), ear pain, postnasal drip, rhinorrhea, sinus pressure, sinus pain, sneezing and sore throat.   Respiratory:  Positive for cough (dry) and shortness of breath. Negative for chest tightness.   Cardiovascular:  Negative for chest pain and palpitations.  Gastrointestinal:  Positive for nausea. Negative for abdominal pain and vomiting.  Neurological:  Positive for headaches. Negative for dizziness and weakness.  Psychiatric/Behavioral:  Positive for sleep disturbance.       Objective    BP (!) 158/71 (BP Location: Right Leg, Patient Position: Sitting, Cuff Size: Large)   Pulse 67   Temp 99.4 F (37.4 C) (Oral)   Resp 20   Wt 143 lb (64.9 kg)   SpO2 96% Comment: room air  BMI 27.47 kg/m    Physical Exam   General Appearance:     Well developed, well nourished female, alert, cooperative, in no acute distress  HENT:   bilateral TM normal without fluid or infection, neck without nodes, pharynx erythematous without exudate, both sinuses tender, and nasal mucosa pale and congested  Eyes:    PERRL, conjunctiva/corneas clear, EOM's intact       Lungs:     Bilateral lower lung rhonchi and occasional expiratory wheeze, respirations unlabored  Heart:    Normal heart rate. Normal rhythm. No murmurs, rubs, or gallops.    Neurologic:   Awake, alert, oriented x 3. No apparent focal neurological           defect.          Assessment & Plan     1. Nausea  - promethazine (PHENERGAN) 25 MG tablet; Take 1 tablet (25 mg total) by mouth every 8 (eight) hours as needed for nausea or vomiting.  Dispense: 10 tablet; Refill: 0  2. Acute cough  - DG Chest 2 View; Future - cefdinir (OMNICEF) 300 MG capsule; Take 2 capsules (600 mg total) by mouth daily for 7 days.  Dispense: 14 capsule; Refill: 0  3. Shortness of breath  - DG Chest 2 View; Future       The entirety of the information documented in the History of Present Illness, Review of Systems and Physical Exam were personally obtained by me. Portions of this information were initially documented by  the CMA and reviewed by me for thoroughness and accuracy.     Lelon Huh, MD  [facility] [phone] (phone) [phone] (fax)  [place]

## 2021-10-25 NOTE — Sepsis Progress Note (Signed)
Elink following Code Sepsis. 

## 2021-10-25 NOTE — ED Notes (Signed)
Fsbs 82 ?

## 2021-10-25 NOTE — Assessment & Plan Note (Signed)
Sepsis criteria includes fever, tachypnea, hypoxia and source of infection ?Continue sepsis fluids and treat pneumonia as outlined below ?

## 2021-10-25 NOTE — ED Provider Notes (Signed)
? ?[facility] ?Provider Note ? ? ? Event Date/Time  ? First MD Initiated Contact with Patient 10/25/21 2037   ?  (approximate) ? ? ?History  ? ?Shortness of Breath ? ? ?HPI ? ?Katie Buckley is a 71 y.o. female who presents to the ED for evaluation of Shortness of Breath ?  ?I reviewed CT surgery clinic visit from 6 days ago.  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  She otherwise has a history of HTN, DM, LVH, GERD.  Discharged on amiodarone after postoperative atrial fibrillation. ?I reviewed PCP visit from earlier today where patient was evaluated for sore throat, congestion and cough.  Chest x-ray was performed as an outpatient and she was started empirically on cefdinir.  I reviewed this outpatient CXR from earlier today that has not yet been read by radiology, I see a left basilar infiltrate and associated parapneumonic effusion. ? ? ?Patient presents to the ED for evaluation of increasing shortness of breath and confusion.  She has trouble finding much history when she first arrives that she is disoriented.  Better history is later obtained by her daughter when she arrives at the bedside saying that patient was more confused at home and so her husband called 911. ? ? ?Physical Exam  ? ?Triage Vital Signs: ?ED Triage Vitals  ?Enc Vitals Group  ?   BP   ?   Pulse   ?   Resp   ?   Temp   ?   Temp src   ?   SpO2   ?   Weight   ?   Height   ?   Head Circumference   ?   Peak Flow   ?   Pain Score   ?   Pain Loc   ?   Pain Edu?   ?   Excl. in [place]?   ? ? ?Most recent vital signs: ?Vitals:  ? 10/25/21 2218 10/25/21 2330  ?BP: (!) 147/58 124/60  ?Pulse: 70 66  ?Resp: (!) 25 18  ?Temp: 99.2 ?F (37.3 ?C)   ?SpO2: 96% 93%  ? ? ?General: Awake, no distress.  Warm to the touch, tachypneic and disoriented.  Follows commands in all 4. ?CV:  Good peripheral perfusion.  ?Resp:  Tachypneic with bibasilar crackles ?Abd:  No distention.  ?MSK:  No deformity noted.  Trace pitting  edema to bilateral lower extremities ?Neuro:  No focal deficits appreciated. ?Other:  Well-healing CABG scar ? ? ?ED Results / Procedures / Treatments  ? ?Labs ?(all labs ordered are listed, but only abnormal results are displayed) ?Labs Reviewed  ?COMPREHENSIVE METABOLIC PANEL - Abnormal; Notable for the following components:  ?    Result Value  ? Glucose, Bld 142 (*)   ? All other components within normal limits  ?CBC WITH DIFFERENTIAL/PLATELET - Abnormal; Notable for the following components:  ? Hemoglobin 10.3 (*)   ? HCT 33.2 (*)   ? MCV 79.6 (*)   ? MCH 24.7 (*)   ? RDW 16.1 (*)   ? All other components within normal limits  ?BRAIN NATRIURETIC PEPTIDE - Abnormal; Notable for the following components:  ? B Natriuretic Peptide 552.6 (*)   ? All other components within normal limits  ?RESP PANEL BY RT-PCR (FLU A&B, COVID) [id number]  ?CULTURE, BLOOD (ROUTINE X 2)  ?CULTURE, BLOOD (ROUTINE X 2)  ?URINE CULTURE  ?LACTIC ACID, PLASMA  ?LACTIC ACID, PLASMA  ?PROTIME-INR  ?  APTT  ?URINALYSIS, COMPLETE (UACMP) WITH MICROSCOPIC  ?HIV ANTIBODY (ROUTINE TESTING W REFLEX)  ?BASIC METABOLIC PANEL  ?CBC  ?CBG MONITORING, ED  ? ? ?EKG ?Sinus rhythm, rate of 71 bpm.  Normal axis.  Left bundle.  No STEMI per S[place] criteria. ? ?RADIOLOGY ?1 view CXR interpreted by me with left basilar infiltrate and associated small effusion ? ?Official radiology report(s): ?DG Chest 2 View ? ?Result Date: 10/25/2021 ?CLINICAL DATA:  Cough and shortness of breath for 2 days EXAM: CHEST - 2 VIEW COMPARISON:  10/19/2021 FINDINGS: Left-sided pleural effusion is again seen. Some improved aeration in the left base is noted when compared with the prior study. No other focal infiltrate is noted. Postsurgical changes are seen. Stable cardiomegaly. No bony abnormality is noted. IMPRESSION: Left-sided pleural effusion and mild left basilar atelectasis but improved from the prior exam. Electronically Signed   By: Inez Catalina M.D.   On: 10/25/2021 21:02   ? ?DG Chest Port 1 View ? ?Result Date: 10/25/2021 ?CLINICAL DATA:  Possible sepsis, shortness of breath and fevers EXAM: PORTABLE CHEST 1 VIEW COMPARISON:  Film from earlier in the same day. FINDINGS: Cardiac shadow is enlarged but stable. Postsurgical changes are noted. Left-sided pleural effusion and mild bibasilar airspace opacities are noted likely related to multifocal infiltrate or atelectasis. No bony abnormality is seen. IMPRESSION: Bibasilar airspace opacities and small left effusion. These of increased slightly in the interval from the prior exam. Electronically Signed   By: Inez Catalina M.D.   On: 10/25/2021 21:00   ? ?PROCEDURES and INTERVENTIONS: ? ?.1-3 Lead EKG Interpretation ?Performed by: Vladimir Crofts, MD ?Authorized by: Vladimir Crofts, MD  ? ?  Interpretation: normal   ?  ECG rate:  60 ?  ECG rate assessment: normal   ?  Rhythm: sinus rhythm   ?  Ectopy: none   ?  Conduction: normal   ?.Critical Care ?Performed by: Vladimir Crofts, MD ?Authorized by: Vladimir Crofts, MD  ? ?Critical care provider statement:  ?  Critical care time (minutes):  30 ?  Critical care time was exclusive of:  Separately billable procedures and treating other patients ?  Critical care was necessary to treat or prevent imminent or life-threatening deterioration of the following conditions:  Sepsis ?  Critical care was time spent personally by me on the following activities:  Development of treatment plan with patient or surrogate, discussions with consultants, evaluation of patient's response to treatment, examination of patient, ordering and review of laboratory studies, ordering and review of radiographic studies, ordering and performing treatments and interventions, pulse oximetry, re-evaluation of patient's condition and review of old charts ? ?Medications  ?lactated ringers infusion ( Intravenous New Bag/Given 10/25/21 2229)  ?acetaminophen (TYLENOL) CR tablet 1,300 mg (has no administration in time range)  ?aspirin EC tablet  325 mg (has no administration in time range)  ?amiodarone (PACERONE) tablet 200 mg (has no administration in time range)  ?amLODipine (NORVASC) tablet 10 mg (has no administration in time range)  ?ezetimibe (ZETIA) tablet 10 mg (has no administration in time range)  ?furosemide (LASIX) tablet 40 mg (has no administration in time range)  ?irbesartan (AVAPRO) tablet 75 mg (has no administration in time range)  ?metoprolol tartrate (LOPRESSOR) tablet 50 mg (has no administration in time range)  ?rosuvastatin (CRESTOR) tablet 20 mg (has no administration in time range)  ?empagliflozin (JARDIANCE) tablet 25 mg (has no administration in time range)  ?pantoprazole (PROTONIX) EC tablet 40 mg (has no administration in time range)  ?  insulin aspart (novoLOG) injection 0-15 Units (has no administration in time range)  ?insulin aspart (novoLOG) injection 0-5 Units (0 Units Subcutaneous Not Given 10/25/21 2351)  ?enoxaparin (LOVENOX) injection 40 mg (has no administration in time range)  ?lactated ringers infusion (has no administration in time range)  ?cefTRIAXone (ROCEPHIN) 2 g in sodium chloride 0.9 % 100 mL IVPB (has no administration in time range)  ?azithromycin (ZITHROMAX) 500 mg in sodium chloride 0.9 % 250 mL IVPB (has no administration in time range)  ?acetaminophen (TYLENOL) tablet 1,000 mg (1,000 mg Oral Given 10/25/21 2058)  ?lactated ringers bolus 1,000 mL (0 mLs Intravenous Stopped 10/25/21 2218)  ? ? ? ?IMPRESSION / MDM / ASSESSMENT AND PLAN / ED COURSE  ?I reviewed the triage vital signs and the nursing notes. ? ?Differential diagnosis includes, but is not limited to, sepsis, pneumonia, HCAP, PTX, cystitis, PE, CHF exacerbation. ? ?{Patient presents with an acute illness or injury that is potentially life-threatening. ? ?71 year old female presents to the ED with confusion and shortness of breath with evidence of sepsis due to acute acquired pneumonia with associated acute metabolic encephalopathy requiring medical  admission.  She was criteria and sepsis by her presenting vital signs.  No instability or shock.  Blood work is surprisingly reassuring with no leukocytosis, metabolic derangements or lactic acidosis.  BN

## 2021-10-25 NOTE — Assessment & Plan Note (Signed)
No acute issues suspected at this time 

## 2021-10-25 NOTE — Assessment & Plan Note (Addendum)
Patient was hypoxic to 86% with EMS requiring 4 L.  Tachypneic to 34 on arrival Supplemental O2 to keep sats over 94% and wean as tolerated  5/19: On BiPAP early this morning, weaned to 8 L/min HFNC oxygen 5/20: On BiPAP overnight, on 13 L/min HFNC this morning on rounds 5/21: Weaned to 7 L/min HFNC, declined BiPAP overnight noted improvement in shortness of breath. 5/22: Early this morning required being placed back on BiPAP, and rising oxygen requirements overnight up to 15 L followed by BiPAP, when seen on 13 L/min HFNC 5/23: Weaned significantly this morning down to 1-2 L/min, maintain sats with ambulation per OT

## 2021-10-25 NOTE — Assessment & Plan Note (Addendum)
No acute issues suspected 

## 2021-10-25 NOTE — ED Notes (Signed)
Report given to Amy, RN.

## 2021-10-25 NOTE — Consult Note (Signed)
CODE SEPSIS - PHARMACY COMMUNICATION ? ?**Broad Spectrum Antibiotics should be administered within 1 hour of Sepsis diagnosis** ? ?Time Code Sepsis Called/Page Received: 2045 ? ?Antibiotics Ordered: 2100 ? ?Time of 1st antibiotic administration: 2103 ? ?Additional action taken by pharmacy: N/A ? ?If necessary, Name of Provider/Nurse Contacted: N/A ? ?Lorna Dibble ,PharmD ?Clinical Pharmacist  ?10/25/2021  10:22 PM  ?

## 2021-10-25 NOTE — Assessment & Plan Note (Addendum)
Patient positive for rhinovirus, covering with antibiotics for most likely secondary bacterial infection.  MRSA screen negative Repeat chest x-ray 5/20 showed extensive infiltrates in both lungs more so on the right, decreased infiltrate in the right perihilar region with possible worsening of the infiltrate in the lateral right lower lung field consistent with multifocal pneumonia versus asymmetric edema (after several days diuresis edema much less likely)  5/22: Persistent and worsened hypoxia despite several days now of IV Rocephin and Zithromax.  Antibiotics escalated to cefepime and IV Solu-Medrol was added 5/23: Significantly better oxygen needs and clinical improvement the past 24 hours  --Pulmonology consulted, appreciate recommendations -- Transition to p.o. Levaquin --Solu-Medrol added by pulm, transition to prednisone tomorrow --Antitussives, DuoNebs as needed --Scheduled Mucinex --Incentive spirometer, flutter valve --Follow blood cultures negative to date --Supplemental oxygen as needed

## 2021-10-25 NOTE — Assessment & Plan Note (Addendum)
Sliding scale insulin coverage.  Will hold metformin and glipizide.  Continue Jardiance  5/23: Now with significant hyperglycemia secondary to IV steroids.  Insulin was increased

## 2021-10-26 ENCOUNTER — Encounter: Payer: Self-pay | Admitting: Internal Medicine

## 2021-10-26 ENCOUNTER — Encounter: Payer: Self-pay | Admitting: *Deleted

## 2021-10-26 ENCOUNTER — Inpatient Hospital Stay: Payer: PPO

## 2021-10-26 ENCOUNTER — Other Ambulatory Visit: Payer: Self-pay

## 2021-10-26 DIAGNOSIS — E876 Hypokalemia: Secondary | ICD-10-CM | POA: Diagnosis not present

## 2021-10-26 DIAGNOSIS — J9601 Acute respiratory failure with hypoxia: Secondary | ICD-10-CM

## 2021-10-26 DIAGNOSIS — Z951 Presence of aortocoronary bypass graft: Secondary | ICD-10-CM

## 2021-10-26 DIAGNOSIS — A419 Sepsis, unspecified organism: Secondary | ICD-10-CM | POA: Diagnosis not present

## 2021-10-26 DIAGNOSIS — Z952 Presence of prosthetic heart valve: Secondary | ICD-10-CM

## 2021-10-26 LAB — URINALYSIS, COMPLETE (UACMP) WITH MICROSCOPIC
Bacteria, UA: NONE SEEN
Bilirubin Urine: NEGATIVE
Glucose, UA: >=500 mg/dL — AB
Hgb urine dipstick: NEGATIVE
Ketones, ur: 5 mg/dL — AB
Leukocytes,Ua: NEGATIVE
Nitrite: NEGATIVE
Protein, ur: NEGATIVE mg/dL
Specific Gravity, Urine: 1.015 (ref 1.005–1.030)
pH: 5 (ref 5.0–8.0)

## 2021-10-26 LAB — RESPIRATORY PANEL BY PCR

## 2021-10-26 LAB — CBC
HCT: 29.1 % — ABNORMAL LOW (ref 36.0–46.0)
Hemoglobin: 9 g/dL — ABNORMAL LOW (ref 12.0–15.0)
MCH: 24.9 pg — ABNORMAL LOW (ref 26.0–34.0)
MCHC: 30.9 g/dL (ref 30.0–36.0)
MCV: 80.4 fL (ref 80.0–100.0)
Platelets: 165 10*3/uL (ref 150–400)
RBC: 3.62 MIL/uL — ABNORMAL LOW (ref 3.87–5.11)
RDW: 16.2 % — ABNORMAL HIGH (ref 11.5–15.5)
WBC: 8.4 10*3/uL (ref 4.0–10.5)
nRBC: 0 % (ref 0.0–0.2)

## 2021-10-26 LAB — BASIC METABOLIC PANEL
Anion gap: 8 (ref 5–15)
BUN: 10 mg/dL (ref 8–23)
CO2: 27 mmol/L (ref 22–32)
Calcium: 8.7 mg/dL — ABNORMAL LOW (ref 8.9–10.3)
Chloride: 107 mmol/L (ref 98–111)
Creatinine, Ser: 0.53 mg/dL (ref 0.44–1.00)
GFR, Estimated: >60 mL/min (ref >60)
Glucose, Bld: 70 mg/dL (ref 70–99)
Potassium: 3.4 mmol/L — ABNORMAL LOW (ref 3.5–5.1)
Sodium: 142 mmol/L (ref 135–145)

## 2021-10-26 LAB — HIV ANTIBODY (ROUTINE TESTING W REFLEX): HIV Screen 4th Generation wRfx: NONREACTIVE

## 2021-10-26 LAB — BLOOD GAS, ARTERIAL
Acid-Base Excess: 3.9 mmol/L — ABNORMAL HIGH (ref 0.0–2.0)
Bicarbonate: 27.7 mmol/L (ref 20.0–28.0)
O2 Content: 4 L/min
O2 Saturation: 92 %
Patient temperature: 37
pCO2 arterial: 38 mmHg (ref 32–48)
pH, Arterial: 7.47 — ABNORMAL HIGH (ref 7.35–7.45)
pO2, Arterial: 58 mmHg — ABNORMAL LOW (ref 83–108)

## 2021-10-26 LAB — GLUCOSE, CAPILLARY
Glucose-Capillary: 86 mg/dL (ref 70–99)
Glucose-Capillary: 138 mg/dL — ABNORMAL HIGH (ref 70–99)
Glucose-Capillary: 112 mg/dL — ABNORMAL HIGH (ref 70–99)
Glucose-Capillary: 121 mg/dL — ABNORMAL HIGH (ref 70–99)

## 2021-10-26 LAB — MRSA NEXT GEN BY PCR, NASAL: MRSA by PCR Next Gen: NOT DETECTED

## 2021-10-26 LAB — BRAIN NATRIURETIC PEPTIDE: B Natriuretic Peptide: 506.5 pg/mL — ABNORMAL HIGH (ref 0.0–100.0)

## 2021-10-26 MED ORDER — BUDESONIDE 0.5 MG/2ML IN SUSP
0.5000 mg | Freq: Two times a day (BID) | RESPIRATORY_TRACT | Status: DC
  Administered 2021-10-26 – 2021-11-02 (×14): 0.5 mg via RESPIRATORY_TRACT
  Filled 2021-10-26 (×14): qty 2

## 2021-10-26 MED ORDER — TRAZODONE HCL 50 MG PO TABS
50.0000 mg | ORAL_TABLET | Freq: Every day | ORAL | Status: DC
  Administered 2021-10-26 – 2021-11-01 (×7): 50 mg via ORAL
  Filled 2021-10-26 (×7): qty 1

## 2021-10-26 MED ORDER — SIMETHICONE 80 MG PO CHEW
80.0000 mg | CHEWABLE_TABLET | Freq: Four times a day (QID) | ORAL | Status: DC | PRN
  Filled 2021-10-26: qty 1

## 2021-10-26 MED ORDER — FUROSEMIDE 40 MG PO TABS: 40.0000 mg | ORAL_TABLET | Freq: Every day | ORAL | Status: DC

## 2021-10-26 MED ORDER — MONTELUKAST SODIUM 10 MG PO TABS
10.0000 mg | ORAL_TABLET | Freq: Every day | ORAL | Status: DC
  Administered 2021-10-26 – 2021-11-01 (×6): 10 mg via ORAL
  Filled 2021-10-26 (×6): qty 1

## 2021-10-26 MED ORDER — POTASSIUM CHLORIDE CRYS ER 20 MEQ PO TBCR
20.0000 meq | EXTENDED_RELEASE_TABLET | Freq: Once | ORAL | Status: AC
  Administered 2021-10-26: 20 meq via ORAL
  Filled 2021-10-26: qty 1

## 2021-10-26 MED ORDER — FUROSEMIDE 10 MG/ML IJ SOLN
40.0000 mg | Freq: Two times a day (BID) | INTRAMUSCULAR | Status: DC
  Administered 2021-10-27 – 2021-10-30 (×7): 40 mg via INTRAVENOUS
  Filled 2021-10-26 (×7): qty 4

## 2021-10-26 MED ORDER — FUROSEMIDE 10 MG/ML IJ SOLN
40.0000 mg | Freq: Once | INTRAMUSCULAR | Status: AC
  Administered 2021-10-26: 40 mg via INTRAVENOUS
  Filled 2021-10-26: qty 4

## 2021-10-26 MED ORDER — SERTRALINE HCL 50 MG PO TABS
200.0000 mg | ORAL_TABLET | Freq: Every day | ORAL | Status: DC
  Administered 2021-10-26 – 2021-11-02 (×8): 200 mg via ORAL
  Filled 2021-10-26 (×8): qty 4

## 2021-10-26 MED ORDER — IPRATROPIUM-ALBUTEROL 0.5-2.5 (3) MG/3ML IN SOLN
3.0000 mL | RESPIRATORY_TRACT | Status: DC
Start: 2021-10-26 — End: 2021-10-30
  Administered 2021-10-26 – 2021-10-30 (×25): 3 mL via RESPIRATORY_TRACT
  Filled 2021-10-26 (×25): qty 3

## 2021-10-26 MED ORDER — BUPROPION HCL ER (SR) 150 MG PO TB12
150.0000 mg | ORAL_TABLET | Freq: Two times a day (BID) | ORAL | Status: DC
  Administered 2021-10-26 – 2021-11-02 (×14): 150 mg via ORAL
  Filled 2021-10-26 (×16): qty 1

## 2021-10-26 MED ORDER — HYDROXYZINE HCL 25 MG PO TABS
25.0000 mg | ORAL_TABLET | Freq: Every day | ORAL | Status: DC | PRN
  Administered 2021-10-27 – 2021-11-01 (×4): 25 mg via ORAL
  Filled 2021-10-26 (×4): qty 1

## 2021-10-26 NOTE — Plan of Care (Signed)
?  Problem: Education: ?Goal: Knowledge of General Education information will improve ?Description: Including pain rating scale, medication(s)/side effects and non-pharmacologic comfort measures ?Outcome: Progressing ?  ?Problem: Clinical Measurements: ?Goal: Respiratory complications will improve ?Outcome: Progressing ?Goal: Cardiovascular complication will be avoided ?Outcome: Progressing ?  ?Problem: Activity: ?Goal: Risk for activity intolerance will decrease ?Outcome: Progressing ?  ?Problem: Coping: ?Goal: Level of anxiety will decrease ?Outcome: Progressing ?  ?Problem: Elimination: ?Goal: Will not experience complications related to urinary retention ?Outcome: Progressing ?  ?Problem: Pain Managment: ?Goal: General experience of comfort will improve ?Outcome: Progressing ?  ?Problem: Safety: ?Goal: Ability to remain free from injury will improve ?Outcome: Progressing ?  ?

## 2021-10-26 NOTE — Consult Note (Signed)
Name: Katie Buckley MRN: [id number] DOB: Nov 06, 1950    ADMISSION DATE:  10/25/2021 CONSULTATION DATE: 10/26/2021  REFERRING MD : Dr. Antonieta Pert  CHIEF COMPLAINT: Shortness of Breath   BRIEF PATIENT DESCRIPTION: 71 yo female admitted with acute hypoxic respiratory failure secondary to acute on chronic CHF exacerbation, atelectasis and suspected bronchitis vs. CAP  SIGNIFICANT EVENTS  5/16: Pt admitted to the medsurg unit 5/17: Pt developed worsening hypoxia PCCM team consulted   STUDIES:  None   HISTORY OF PRESENT ILLNESS:   This is a 71 yo female with a PMH of Anxiety, Tardive Dyskinesia,  Aortic Stenosis s/p Bioprosthetic TAVR (09/2021), Chronic Diastolic CHF (EF 65 to 01% via Echo 10/12/2021), Sinus Infection, Headache, Barrett's Esophagus, Hyperchloremia, MRSA (2018), Palpitations, Skin Cancer, and Seasonal Allergies.  She presented to [facility] on 05/16 via EMS from home with a 2 week hx of shortness of breath, cough, and sore throat.  Per ER notes EMS reported pt febrile temp 102 F and hypoxic on RA O2 sats 86%.  She was placed on 4L O2 via nasal canula.  Pt previously seen by her PCP on 05/16 CXR ordered and pt prescribed empiric cefdinir, however due to worsening symptoms pt notified EMS.   ED Course  Upon arrival to the ER pt slightly confused with shortness of breath.  CXR revealed left-sided pleural effusion and mild left basilar atelectasis.  Lab results revealed BNP 552.6.  Flu A&B/Covid negative.  UA negative for UTI.  She received ceftriaxone, azithromycin, and gentle iv fluid resuscitation due to concern for possible pneumonia. She was subsequently admitted to the [facility] unit per hospitalist team for additional workup and treatment. On 05/17 pt developed worsening acute hypoxic respiratory failure PCCM consulted to assist with management.   PAST MEDICAL HISTORY :   has a past medical history of Anxiety, Aortic stenosis (01/21/2013), Frequent sinus infections, Headache,  History of Barrett's esophagus, Hyperchloremia, MRSA (methicillin resistant staph aureus) culture positive (2018), Palpitations, Seasonal allergies, and Skin cancer.  has a past surgical history that includes Nasal sinus surgery (03/2013); Release of Trigger Finger (Right, 10/2010); Carpal Tunnel Symdrome (2008); Cholecystectomy (2007); Bone Spur (2007); Tonsillectomy (1960); Mandible surgery; Colonoscopy with propofol (N/A, 11/17/2015); Esophagogastroduodenoscopy (egd) with propofol (N/A, 11/17/2015); Cataract extraction w/PHACO (Left, 01/16/2019); Cardiac catheterization; Augmentation mammaplasty (Bilateral); Foot surgery; Cataract extraction w/PHACO (Right, 02/06/2019); Eye surgery (Bilateral); Anterior cervical decomp/discectomy fusion (N/A, 11/15/2020); RIGHT/LEFT HEART CATH AND CORONARY ANGIOGRAPHY (N/A, 05/24/2021); INTRAVASCULAR PRESSURE WIRE/FFR STUDY (N/A, 05/24/2021); Coronary artery bypass graft (N/A, 09/19/2021); Aortic valve replacement (N/A, 09/19/2021); and TEE without cardioversion (N/A, 09/19/2021). Prior to Admission medications   Medication Sig Start Date End Date Taking? Authorizing Provider  acetaminophen (TYLENOL) 650 MG CR tablet Take 1,300 mg by mouth every 8 (eight) hours as needed for pain (pain).    [provider]  amiodarone (PACERONE) 200 MG tablet Take 1 tablet (200 mg total) by mouth 2 (two) times daily. For 7 days then take 200 mg daily thereafter 09/24/21   Nani Skillern, PA-C  amLODipine (NORVASC) 10 MG tablet Take 1 tablet (10 mg total) by mouth daily. 09/24/21   Nani Skillern, PA-C  aspirin EC 325 MG EC tablet Take 1 tablet (325 mg total) by mouth daily. 09/24/21   Nani Skillern, PA-C  buPROPion (WELLBUTRIN SR) 150 MG 12 hr tablet Take 150 mg by mouth 2 (two) times daily. 05/25/20   [provider]  butalbital-acetaminophen-caffeine (FIORICET) 50-325-40 MG tablet TAKE 1 TABLET BY MOUTH EVERY  6 HOURS AS NEEDED FOR HEADACHE Patient taking  differently: Take 1 tablet by mouth every 6 (six) hours as needed for headache. 05/23/21   [name], MD  cefdinir (OMNICEF) 300 MG capsule Take 2 capsules (600 mg total) by mouth daily for 7 days. 10/25/21 11/01/21  [name], MD  ezetimibe (ZETIA) 10 MG tablet TAKE 1 TABLET BY MOUTH EVERYDAY AT BEDTIME Patient taking differently: Take 10 mg by mouth at bedtime. 07/07/20   [name], MD  fexofenadine (ALLEGRA) 180 MG tablet Take 180 mg by mouth daily as needed for allergies or rhinitis.    [provider]  fluconazole (DIFLUCAN) 150 MG tablet Take one tablet on day you receive medication Take second tablet three days later Take third and last tablet three days later 01/26/21   Gwyneth Sprout, FNP  furosemide (LASIX) 40 MG tablet Take 1 tablet (40 mg total) by mouth daily. For 5 days then stop. 09/25/21   Nani Skillern, PA-C  glipiZIDE (GLUCOTROL) 10 MG tablet TAKE 1 TABLET BY MOUTH EVERY DAY BEFORE BREAKFAST Patient taking differently: Take 10 mg by mouth daily before breakfast. 05/30/21   [name], MD  hydrOXYzine (VISTARIL) 25 MG capsule Take 25 mg by mouth daily as needed for anxiety. 10/05/20   [provider]  irbesartan (AVAPRO) 75 MG tablet TAKE 1 TABLET BY MOUTH EVERY DAY 09/28/21   [name], MD  JARDIANCE 25 MG TABS tablet TAKE 1 TABLET (25 MG TOTAL) BY MOUTH DAILY. Patient taking differently: 25 mg daily. 07/18/21   [name], MD  [place]     [provider]  loperamide (IMODIUM) 2 MG capsule Take 2-4 mg by mouth daily as needed for diarrhea or loose stools.    [provider]  metFORMIN (GLUCOPHAGE-XR) 500 MG 24 hr tablet Take 2 tablets (1,000 mg total) by mouth daily. 10/27/20   [name], MD  methocarbamol (ROBAXIN) 500 MG tablet Take 1 tablet (500 mg total) by mouth every 6 (six) hours as needed for muscle spasms. 11/16/20   Deetta Perla, MD  metoprolol tartrate (LOPRESSOR) 50 MG tablet Take  1 tablet (50 mg total) by mouth 2 (two) times daily. 09/24/21   Lars Pinks M, PA-C  montelukast (SINGULAIR) 10 MG tablet TAKE 1 TABLET BY MOUTH EVERY DAY Patient taking differently: Take 10 mg by mouth at bedtime as needed (allergies). 05/18/20   [name], MD  Olopatadine HCl 0.7 % SOLN Place 1 drop into both eyes daily as needed (allergies).    [provider]  ondansetron (ZOFRAN ODT) 4 MG disintegrating tablet Take 1 tablet (4 mg total) by mouth every 8 (eight) hours as needed for nausea or vomiting. 09/30/21   [name], MD  pregabalin (LYRICA) 100 MG capsule Take 100 mg by mouth 3 (three) times daily. 05/18/21   [provider]  RABEprazole (ACIPHEX) 20 MG tablet Take 1 tablet (20 mg total) by mouth daily. 09/30/21   [name], MD  rosuvastatin (CRESTOR) 20 MG tablet TAKE 1 TABLET BY MOUTH EVERY DAY IN THE EVENING Patient taking differently: Take 20 mg by mouth daily. 05/30/21   [name], MD  sertraline (ZOLOFT) 100 MG tablet Take 200 mg by mouth daily.    [provider]  simethicone (MYLICON) 80 MG chewable tablet Chew 80 mg by mouth every 6 (six) hours as needed for flatulence.    [provider]  tirzepatide (  MOUNJARO) 5 MG/0.5ML Pen Inject 5 mg into the skin once a week. 09/30/21   [name], MD  traZODone (DESYREL) 50 MG tablet Take 50-100 mg by mouth at bedtime as needed for sleep.     [provider]  valACYclovir (VALTREX) 1000 MG tablet Take 1 tablet daily for 5 days as needed 09/30/21   [name], MD   Allergies  Allergen Reactions   Doxycycline Other (See Comments)    Emotional changes/anxious   Biaxin [Clarithromycin]     GI upset, and bad taste in mouth.    FAMILY HISTORY:  family history includes Breast cancer in her maternal aunt; Colon cancer in her brother; Diabetes in her father; Irritable bowel syndrome in her sister; Lung cancer in her mother. SOCIAL HISTORY:  reports that  she has never smoked. She has never used smokeless tobacco. She reports that she does not drink alcohol and does not use drugs.  REVIEW OF SYSTEMS: Positives in BOLD  Constitutional: Negative for fever, chills, weight loss, malaise/fatigue and diaphoresis.  HENT: hearing loss, ear pain, nosebleeds, congestion, sore throat, neck pain, tinnitus and ear discharge.   Eyes: Negative for blurred vision, double vision, photophobia, pain, discharge and redness.  Respiratory: cough, hemoptysis, sputum production, shortness of breath, wheezing and stridor.   Cardiovascular: Negative for chest pain, palpitations, orthopnea, claudication, leg swelling and PND.  Gastrointestinal: Negative for heartburn, nausea, vomiting, abdominal pain, diarrhea, constipation, blood in stool and melena.  Genitourinary: Negative for dysuria, urgency, frequency, hematuria and flank pain.  Musculoskeletal: Negative for myalgias, back pain, joint pain and falls.  Skin: Negative for itching and rash.  Neurological: Negative for dizziness, tingling, tremors, sensory change, speech change, focal weakness, seizures, loss of consciousness, weakness and headaches.  Endo/Heme/Allergies: Negative for environmental allergies and polydipsia. Does not bruise/bleed easily.  SUBJECTIVE:  Pt states she feels slightly short of breath and continues to have a productive cough   VITAL SIGNS: Temp:  [98.4 F (36.9 C)-102.6 F (39.2 C)] 98.6 F (37 C) (05/17 1119) Pulse Rate:  [64-71] 70 (05/17 1119) Resp:  [15-34] 22 (05/17 1119) BP: (122-158)/(51-85) 150/51 (05/17 1119) SpO2:  [89 %-97 %] 91 % (05/17 1119)  PHYSICAL EXAMINATION: General: Acutely ill appearing female, NAD on 4L O2  Neuro: Alert and oriented, follows commands, tardive dyskinesia present  HEENT: Supple, mild JVD present Cardiovascular: s1s2, rrr, no r/g, 2+ radial/2+ distal pulses, trace bilateral ankle edema  Lungs: Diffuse crackles throughout, even, non labored   Abdomen: +BS x4, soft, non tender, non distended  Musculoskeletal: Normal bulk and tone, no edema Skin: Intact no rashes or lesions present   Recent Labs  Lab 10/25/21 2038 10/26/21 0327  NA 138 142  K 3.7 3.4*  CL 103 107  CO2 26 27  BUN 13 10  CREATININE 0.61 0.53  GLUCOSE 142* 70   Recent Labs  Lab 10/25/21 2038 10/26/21 0327  HGB 10.3* 9.0*  HCT 33.2* 29.1*  WBC 8.2 8.4  PLT 185 165   DG Chest 2 View  Result Date: 10/25/2021 CLINICAL DATA:  Cough and shortness of breath for 2 days EXAM: CHEST - 2 VIEW COMPARISON:  10/19/2021 FINDINGS: Left-sided pleural effusion is again seen. Some improved aeration in the left base is noted when compared with the prior study. No other focal infiltrate is noted. Postsurgical changes are seen. Stable cardiomegaly. No bony abnormality is noted. IMPRESSION: Left-sided pleural effusion and mild left basilar atelectasis but improved from the prior exam. Electronically Signed  By: Inez Catalina M.D.   On: 10/25/2021 21:02   DG Chest Port 1 View  Result Date: 10/26/2021 CLINICAL DATA:  Shortness of breath EXAM: PORTABLE CHEST 1 VIEW COMPARISON:  Previous day FINDINGS: Stable enlargement of the heart. Probable tiny left pleural effusion. No large right pleural effusion. No pneumothorax. Worsening bilateral right-greater-than-left interstitial and hazy opacities. Median sternotomy wires. IMPRESSION: Worsening bilateral interstitial and hazy opacities, edema versus multifocal infection. Electronically Signed   By: Albin Felling M.D.   On: 10/26/2021 13:51   DG Chest Port 1 View  Result Date: 10/25/2021 CLINICAL DATA:  Possible sepsis, shortness of breath and fevers EXAM: PORTABLE CHEST 1 VIEW COMPARISON:  Film from earlier in the same day. FINDINGS: Cardiac shadow is enlarged but stable. Postsurgical changes are noted. Left-sided pleural effusion and mild bibasilar airspace opacities are noted likely related to multifocal infiltrate or atelectasis. No  bony abnormality is seen. IMPRESSION: Bibasilar airspace opacities and small left effusion. These of increased slightly in the interval from the prior exam. Electronically Signed   By: Inez Catalina M.D.   On: 10/25/2021 21:00    ASSESSMENT / PLAN: Acute hypoxic respiratory failure secondary to CHF exacerbation, suspected bronchitis vs CAP, and atelectasis  Hx: Bioprosthetic aortic valve replacement and single-vessel CABG 09/2021 - Supplemental O2 for dyspnea and/or hypoxia  - Scheduled nebulized steroids and duonebs - Trend WBC and monitor fever curve  - Follow cultures  - Continue ceftriaxone and azithromycin for possible CAP coverage - Will check respiratory (~20 pathogens) panel by PCR and respiratory culture  - Aggressive pulmonary hygiene  - Cardiology consulted appreciate input  - Continue iv lasix 40 mg bid as bp permits   Donell Beers, [place] Pager [phone] (please enter 7 digits) PCCM Consult Pager [phone] (please enter 7 digits)

## 2021-10-26 NOTE — Progress Notes (Signed)
Cardiac Individual Treatment Plan ? ?Patient Details  ?Name: Katie Buckley ?MRN: [id number] ?Date of Birth: 11/13/1950 ?Referring Provider:   ?Flowsheet Row Cardiac Rehab from 10/20/2021 in [facility] Cardiac and Pulmonary Rehab  ?Referring Provider Lujean Amel MD  ? ?  ? ? ?Initial Encounter Date:  ?Flowsheet Row Cardiac Rehab from 10/20/2021 in [facility] Cardiac and Pulmonary Rehab  ?Date 10/20/21  ? ?  ? ? ?Visit Diagnosis: S/P CABG x 1 ? ?S/P aortic valve replacement ? ?Patient's Home Medications on Admission: ?No current facility-administered medications for this visit. ?No current outpatient medications on file. ? ?Facility-Administered Medications Ordered in Other Visits:  ?  acetaminophen (TYLENOL) tablet 1,000 mg, 1,000 mg, Oral, Q8H PRN, Athena Masse, MD, 1,000 mg at 10/26/21 0344 ?  amiodarone (PACERONE) tablet 200 mg, 200 mg, Oral, Daily, Judd Gaudier V, MD, 200 mg at 10/26/21 0910 ?  amLODipine (NORVASC) tablet 10 mg, 10 mg, Oral, Daily, Judd Gaudier V, MD, 10 mg at 10/26/21 3893 ?  aspirin EC tablet 325 mg, 325 mg, Oral, Daily, Judd Gaudier V, MD, 325 mg at 10/26/21 7342 ?  azithromycin (ZITHROMAX) 500 mg in sodium chloride 0.9 % 250 mL IVPB, 500 mg, Intravenous, Q24H, Athena Masse, MD ?  cefTRIAXone (ROCEPHIN) 2 g in sodium chloride 0.9 % 100 mL IVPB, 2 g, Intravenous, Q24H, Athena Masse, MD ?  empagliflozin (JARDIANCE) tablet 25 mg, 25 mg, Oral, Daily, Athena Masse, MD, 25 mg at 10/26/21 8768 ?  enoxaparin (LOVENOX) injection 40 mg, 40 mg, Subcutaneous, Q24H, Judd Gaudier V, MD, 40 mg at 10/26/21 1157 ?  ezetimibe (ZETIA) tablet 10 mg, 10 mg, Oral, QHS, Athena Masse, MD ?  hydrOXYzine (ATARAX) tablet 25 mg, 25 mg, Oral, Daily PRN, Athena Masse, MD ?  insulin aspart (novoLOG) injection 0-15 Units, 0-15 Units, Subcutaneous, TID WC, Athena Masse, MD ?  insulin aspart (novoLOG) injection 0-5 Units, 0-5 Units, Subcutaneous, QHS, Athena Masse, MD ?  irbesartan (AVAPRO) tablet 75 mg, 75  mg, Oral, Daily, Athena Masse, MD, 75 mg at 10/26/21 2620 ?  lactated ringers infusion, , Intravenous, Continuous, Vladimir Crofts, MD, Last Rate: 150 mL/hr at 10/26/21 0831, Infusion Verify at 10/26/21 0831 ?  metoprolol tartrate (LOPRESSOR) tablet 50 mg, 50 mg, Oral, BID, Athena Masse, MD, 50 mg at 10/26/21 0910 ?  pantoprazole (PROTONIX) EC tablet 40 mg, 40 mg, Oral, Daily, Athena Masse, MD, 40 mg at 10/26/21 0910 ?  rosuvastatin (CRESTOR) tablet 20 mg, 20 mg, Oral, Daily, Judd Gaudier V, MD, 20 mg at 10/26/21 0910 ?  traZODone (DESYREL) tablet 50 mg, 50 mg, Oral, QHS, Athena Masse, MD, 50 mg at 10/26/21 0055 ? ?Past Medical History: ?Past Medical History:  ?Diagnosis Date  ? Anxiety   ? Aortic stenosis 01/21/2013  ? 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 Frequent sinus infections   ? Headache   ? Tension headaches  ? History of Barrett's esophagus   ? Hyperchloremia   ? MRSA (methicillin resistant staph aureus) culture positive 2018  ? history of, in hand  ? Palpitations   ? Seasonal allergies   ? Skin cancer   ? 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 ? ?Tobacco Use: ?Social History  ? ?Tobacco Use  ?Smoking Status Never  ?Smokeless Tobacco Never  ? ? ?Labs: ?Review Flowsheet   ? ?  ?  Latest Ref Rng & Units 02/09/2021 03/25/2021 09/15/2021 09/19/2021  ?Labs for ITP Cardiac  and Pulmonary Rehab  ?Cholestrol 0 - 200 mg/dL 236       ?LDL (calc) 0 - 99 mg/dL 108       ?HDL-C >40 mg/dL 88       ?Trlycerides <150 mg/dL 238       ?Hemoglobin A1c 4.8 - 5.6 % 11.3   9.2   8.0     ?PH, Arterial 7.35 - 7.45   7.46   7.303    ? 7.261    ? 7.286    ? 7.267    ? 7.406    ? 7.461    ? 7.451    ? 7.326    ?PCO2 arterial 32 - 48 mmHg   46   48.2    ? 57.1    ? 52.8    ? 56.2    ? 37.5    ? 36.9    ? 41.1    ? 56.6    ?Bicarbonate 20.0 - 28.0 mmol/L   32.7   23.8    ? 25.5    ? 25.1    ? 25.6    ? 23.6    ? 26.3    ? 27.3    ? 28.7    ? 29.5    ?TCO2 22 - 32 mmol/L    25    ? 27    ? 27    ? 27    ? 25    ? 25    ? 27    ?  27    ? 27    ? 27    ? 29    ? 30    ? 26    ? 29    ? 31    ?Acid-base deficit 0.0 - 2.0 mmol/L    3.0    ? 2.0    ? 2.0    ? 2.0    ? 1.0    ?O2 Saturation %   98   97    ? 99    ? 99    ? 98    ? 94    ? 100    ? 71    ? 100    ? 100    ? ?  09/21/2021  ?Labs for ITP Cardiac and Pulmonary Rehab  ?Cholestrol 87    ?LDL (calc) 21    ?HDL-C 42    ?Trlycerides 122    ?Hemoglobin A1c   ?PH, Arterial   ?PCO2 arterial   ?Bicarbonate   ?TCO2   ?Acid-base deficit   ?O2 Saturation   ?  ? ? Multiple values from one day are sorted in reverse-chronological order  ?  ?  ? ? ? ?Exercise Target Goals: ?Exercise Program Goal: ?Individual exercise prescription set using results from initial 6 min walk test and THRR while considering  patient?s activity barriers and safety.  ? ?Exercise Prescription Goal: ?Initial exercise prescription builds to 30-45 minutes a day of aerobic activity, 2-3 days per week.  Home exercise guidelines will be given to patient during program as part of exercise prescription that the participant will acknowledge. ? ? ?Education: Aerobic Exercise: ?- Group verbal and visual presentation on the components of exercise prescription. Introduces F.I.T.T principle from ACSM for exercise prescriptions.  Reviews F.I.T.T. principles of aerobic exercise including progression. Written material given at graduation. ?Flowsheet Row Cardiac Rehab from 10/20/2021 in [facility] Cardiac and Pulmonary Rehab  ?Education need identified 10/20/21  ? ?  ? ? ?  Education: Resistance Exercise: ?- Group verbal and visual presentation on the components of exercise prescription. Introduces F.I.T.T principle from ACSM for exercise prescriptions  Reviews F.I.T.T. principles of resistance exercise including progression. Written material given at graduation. ? ?  ?Education: Exercise & Equipment Safety: ?- Individual verbal instruction and demonstration of equipment use and safety with use of the equipment. ?Flowsheet Row Cardiac Rehab from  10/20/2021 in [facility] Cardiac and Pulmonary Rehab  ?Education need identified 10/20/21  ?Date 10/20/21  ?Educator KL  ?Instruction Review Code 1- Verbalizes Understanding  ? ?  ? ? ?Education: Exercise Physiology & General Exercise Guidelines: ?- Group verbal and written instruction with models to review the exercise physiology of the cardiovascular system and associated critical values. Provides general exercise guidelines with specific guidelines to those with heart or lung disease.  ? ? ?Education: Flexibility, Balance, Mind/Body Relaxation: ?- Group verbal and visual presentation with interactive activity on the components of exercise prescription. Introduces F.I.T.T principle from ACSM for exercise prescriptions. Reviews F.I.T.T. principles of flexibility and balance exercise training including progression. Also discusses the mind body connection.  Reviews various relaxation techniques to help reduce and manage stress (i.e. Deep breathing, progressive muscle relaxation, and visualization). Balance handout provided to take home. Written material given at graduation. ? ? ?Activity Barriers & Risk Stratification: ? Activity Barriers & Cardiac Risk Stratification - 10/20/21 1125   ? ?  ? Activity Barriers & Cardiac Risk Stratification  ? Activity Barriers Balance Concerns;Deconditioning;Muscular Weakness;Other (comment);Incisional Pain   ? Comments Neuropathy   ? Cardiac Risk Stratification High   ? ?  ?  ? ?  ? ? ?6 Minute Walk: ? 6 Minute Walk   ? ? [place] Name 10/20/21 1126  ?  ?  ?  ? 6 Minute Walk  ? Phase Initial    ? Distance 830 feet    ? Walk Time 5 minutes    ? # of Rest Breaks 1  2:27-3:29    ? MPH 1.88    ? METS 1.63    ? RPE 15    ? Perceived Dyspnea  2    ? VO2 Peak 5.72    ? Symptoms Yes (comment)    ? Comments Bilateral leg weakness    ? Resting HR 57 bpm    ? Resting BP 130/58    ? Resting Oxygen Saturation  98 %    ? Exercise Oxygen Saturation  during 6 min walk 97 %    ? Max Ex. HR 65 bpm    ? Max Ex.  BP 136/62    ? 2 Minute Post BP 128/58    ? ?  ?  ? ?  ? ? ?Oxygen Initial Assessment: ? ? ?Oxygen Re-Evaluation: ? ? ?Oxygen Discharge (Final Oxygen Re-Evaluation): ? ? ?Initial Exercise Prescription: ? In

## 2021-10-26 NOTE — Hospital Course (Addendum)
70 y.o. female with -DM with neuropathy, HTN, diastolic CHF, severe aortic stenosis s/p TAVR on 09/19/2021 and s/p single-vessel CABG on 09/19/2021 with paroxysmal A-fib postop, currently on amiodarone  presents w/ 2-week history of shortness of breath, cough congestion and sore throat, now associated with fever up to 102, was prescribed amoxicillin by her cardiothoracic surgeon a few days prior but without improvement and her PCP ordered Omnicef a couple days ago but patient has not been taking it as prescribed PCP a few days prior on arrival of EMS O2 sat was 86% improving to the 90s on 4 L. In ED- TEMP-102.6 with pulse 71, respirations 34, BP 154/85 and O2 sat 89% on room air improving to 96% on 2 L. WBC of 8200 and lactic acid 0.8.  Hemoglobin 10.3.  CMP unremarkable, BNP 552.  COVID and flu negative.  CXR-bibasilar airspace opacities and small left effusion slightly increased from prior exam earlier on the same day.Patient given an IV fluid bolus started on Rocephin and azithromycin Hospitalist consulted for admission.  Pulmonology and cardiology consulted.  5/18 - pt with worsening hypoxia, spO2 of 84% on 6 L/min O2.  Placed on Bipap and transferred to Progressive unit.

## 2021-10-26 NOTE — Progress Notes (Addendum)
?PROGRESS NOTE ?[place]  PPI:[id number] DOB: 1950/10/28 DOA: 10/25/2021 ?PCP: [name], MD  ? ?Brief Narrative/Hospital Course: ?71 y.o. female with -DM with neuropathy, HTN, diastolic CHF, severe aortic stenosis s/p TAVR on 09/19/2021 and s/p single-vessel CABG on 09/19/2021 with paroxysmal A-fib postop, currently on amiodarone  presents w/ 2-week history of shortness of breath, cough congestion and sore throat, now associated with fever up to 102, was prescribed amoxicillin by her cardiothoracic surgeon a few days prior but without improvement and her PCP ordered Omnicef a couple days ago but patient has not been taking it as prescribed PCP a few days prior on arrival of EMS O2 sat was 86% improving to the 90s on 4 L. ?In ED- TEMP-102.6 with pulse 71, respirations 34, BP 154/85 and O2 sat 89% on room air improving to 96% on 2 L. WBC of 8200 and lactic acid 0.8.  Hemoglobin 10.3.  CMP unremarkable, BNP 552.  COVID and flu negative.  CXR-bibasilar airspace opacities and small left effusion slightly increased from prior exam earlier on the same day.Patient given an IV fluid bolus started on Rocephin and azithromycin Hospitalist consulted for admission.  ?  ?Subjective: ? ?Seen and examined this morning, alert awake resting comfortably complains of some cough, on 2 L nasal cannula. ?Fever 102.6 on admission, afebrile since then. ?Lactic acid 0.8 on admission WBC normal ? ?Assessment and Plan: ?Principal Problem: ?  Sepsis ([place]) ?Active Problems: ?  Pneumonia ?  Acute respiratory failure with hypoxia ([place]) ?  Anemia ?  Diastolic dysfunction ?  Essential hypertension ?  Diabetes mellitus with neuropathy ([place]) ?  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 ?  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 ?  Paroxysmal atrial fibrillation, post CABG [facility]) ?  Hypokalemia ?  ?Sepsis POA ?Pneumonia ?Acute respiratory failure with hypoxia ?Met sepsis criteria w/ fever, tachypnea, hypoxia , cxr w/ bibasilar opacities  and suspecting pneumonia.hemodynamically stable.  This am doing well on 2 L nasal cannula.Continue ceftriaxone azithromycin.  MRSA screen pending continue DuoNebs antitussives .follow-up culture data.   ? ?S/P CABGx1, 09/19/21 ?S/P Aortic valve replacement with tissue valve 09/19/21 ?Paroxysmal atrial fibrillation, post CABG hx: ?Continue amiodarone, aspirin. Not currently on anticoagulation likely as A-fib occurred 1 time in the postop period And rapidly converted to sinus with amiodarone bolus ? ?Diabetes mellitus with neuropathy: ?Holding metformin glipizide continue Jardiance, sliding scale insulin. ?Recent Labs  ?Lab 10/24/21 ?0630 10/24/21 ?1019 10/25/21 ?2350 10/26/21 ?0732  ?GLUCAP 146* 143* 82 86  ?  ?Essential hypertension: Well-controlled on amlodipine irbesartan metoprolol ? ?Diastolic dysfunction, chronic: BNP elevated over 500 monitor closely monitor intake output Daily  wt Continue Lasix> changed to IV given patient's hypoxia.  Cont her metoprolol, Jardiance.  Suspect some component of acute exacerbation. ? ?Hypokalemia-add oral supplement. ?Anemia appears chronic, history of B12 [place].  Follow-up with PCP ?Recent Labs  ?Lab 10/25/21 ?2038 10/26/21 ?0327  ?HGB 10.3* 9.0*  ?HCT 33.2* 29.1*  ? ? ?Addendum: 1:10 pm ?Nurse reports patient having hypoxia not able to take deep breath.  We will give Lasix 40x1 iv.  Get Chest x-ray ABG  and consulted pulmonary Dr Mortimer Fries.  Low threshold for higher level of care , monitor closely ? ?DVT prophylaxis: enoxaparin (LOVENOX) injection 40 mg Start: 10/26/21 0800 ?Code Status:   Code Status: Full Code ?Family Communication: plan of care discussed with patient at bedside. ?Patient status is: inpatient because of ongoing management of pneumonia and hypoxia. ?Level of care: Telemetry Medical  ? ?  Dispo: The patient is from: home, lives with husband ?           Anticipated disposition: Home in next 2 days once stable ? ?Mobility Assessment (last 72 hours)   ? ? Mobility  Assessment   ? ? [place] Name 10/26/21 0159  ?  ?  ?  ?  ? Does patient have an order for bedrest or is patient medically unstable No - Continue assessment      ? What is the highest level of mobility based on the progressive mobility assessment? Level 5 (Walks with assist in room/hall) - Balance while stepping forward/back and can walk in room with assist - Complete      ? ?  ?  ? ?  ?  ? ?Objective: ?Vitals last 24 hrs: ?Vitals:  ? 10/26/21 0155 10/26/21 0158 10/26/21 1610 10/26/21 0731  ?BP: (!) 143/55 (!) 142/51 (!) 134/56 (!) 154/54  ?Pulse: 65 65 69 70  ?Resp:  $Remov'19 19 20  'IsWlZI$ ?Temp: 98.4 ?F (36.9 ?C)  98.5 ?F (36.9 ?C) 98.8 ?F (37.1 ?C)  ?TempSrc:    Oral  ?SpO2: 93% 94% 90% 92%  ? ?Weight change:  ? ?Physical Examination: ?General exam: alert awake,older than stated age, weak appearing. ?HEENT:Oral mucosa moist, Ear/Nose WNL grossly, dentition normal. ?Respiratory system: bilaterally basal.  Breath sounds,no use of accessory muscle ?Cardiovascular system: S1 & S2 +, No JVD. ?Gastrointestinal system: Abdomen soft,NT,ND, BS+ ?Nervous System:Alert, awake, moving extremities and grossly nonfocal ?Extremities: LE edema neg,distal peripheral pulses palpable.  ?Skin: No rashes,no icterus. ?MSK: Normal muscle bulk,tone, power ? ?Medications reviewed:  ?Scheduled Meds: ? amiodarone  200 mg Oral Daily  ? amLODipine  10 mg Oral Daily  ? aspirin  325 mg Oral Daily  ? empagliflozin  25 mg Oral Daily  ? enoxaparin (LOVENOX) injection  40 mg Subcutaneous Q24H  ? ezetimibe  10 mg Oral QHS  ? insulin aspart  0-15 Units Subcutaneous TID WC  ? insulin aspart  0-5 Units Subcutaneous QHS  ? irbesartan  75 mg Oral Daily  ? metoprolol tartrate  50 mg Oral BID  ? pantoprazole  40 mg Oral Daily  ? rosuvastatin  20 mg Oral Daily  ? traZODone  50 mg Oral QHS  ? ?Continuous Infusions: ? azithromycin    ? cefTRIAXone (ROCEPHIN)  IV    ? ? ?  ?Diet Order   ? ?       ?  Diet heart healthy/carb modified Room service appropriate? Yes; Fluid  consistency: Thin  Diet effective now       ?  ? ?  ?  ? ?  ?  ? ?  ?  ?  ? ? ?Intake/Output Summary (Last 24 hours) at 10/26/2021 1113 ?Last data filed at 10/26/2021 1012 ?Gross per 24 hour  ?Intake 2670.94 ml  ?Output 550 ml  ?Net 2120.94 ml  ? ?Net IO Since Admission: 2,120.94 mL [10/26/21 1113]  ?Wt Readings from Last 3 Encounters:  ?10/25/21 64.9 kg  ?10/20/21 64.8 kg  ?10/19/21 64.9 kg  ?  ? ?Unresulted Labs (From admission, onward)  ? ?  Start     Ordered  ? 11/01/21 0500  Creatinine, serum  (enoxaparin (LOVENOX)    CrCl >/= 30 ml/min)  Weekly,   R     ?Comments: while on enoxaparin therapy ?  ? 10/25/21 2325  ? 10/27/21 9604  Basic metabolic panel  Tomorrow morning,   R       ?Question:  Specimen collection method  Answer:  Lab=Lab collect  ? 10/26/21 0808  ? 10/27/21 0500  CBC  Tomorrow morning,   R       ?Question:  Specimen collection method  Answer:  Lab=Lab collect  ? 10/26/21 0808  ? 04-Nov-2021 2042  Urine Culture  (Undifferentiated presentation (screening labs and basic nursing orders))  ONCE - URGENT,   URGENT       ?Question:  Indication  Answer:  Sepsis  ? 11-04-2021 2041  ? ?  ?  ? ?  ?Data Reviewed: I have personally reviewed following labs and imaging studies ?CBC: ?Recent Labs  ?Lab 2021-11-04 ?2038 10/26/21 ?0327  ?WBC 8.2 8.4  ?NEUTROABS 6.5  --   ?HGB 10.3* 9.0*  ?HCT 33.2* 29.1*  ?MCV 79.6* 80.4  ?PLT 185 165  ? ?Basic Metabolic Panel: ?Recent Labs  ?Lab 11/04/21 ?2038 10/26/21 ?0327  ?NA 138 142  ?K 3.7 3.4*  ?CL 103 107  ?CO2 26 27  ?GLUCOSE 142* 70  ?BUN 13 10  ?CREATININE 0.61 0.53  ?CALCIUM 9.2 8.7*  ? ?GFR: ?Estimated Creatinine Clearance: 55.8 mL/min (by C-G formula based on SCr of 0.53 mg/dL). ?Liver Function Tests: ?Recent Labs  ?Lab 11-04-2021 ?2038  ?AST 28  ?ALT 17  ?ALKPHOS 99  ?BILITOT 0.5  ?PROT 7.0  ?ALBUMIN 3.8  ? ?No results for input(s): LIPASE, AMYLASE in the last 168 hours. ?No results for input(s): AMMONIA in the last 168 hours. ?Coagulation Profile: ?Recent Labs  ?Lab  11-04-21 ?2038  ?INR 1.1  ? ?BNP (last 3 results) ?No results for input(s): PROBNP in the last 8760 hours. ?HbA1C: ?No results for input(s): HGBA1C in the last 72 hours. ?CBG: ?Recent Labs  ?Lab 10/24/21 ?7341 10/24/21 ?[place]

## 2021-10-26 NOTE — ED Notes (Signed)
Report messaged to [facility] rn floor nurse ?

## 2021-10-26 NOTE — Progress Notes (Addendum)
Brief Cardiology Consult    Impression  Sepsis Hypoxia  SOB PNA community acquired  Bronchitis  Cough Congestion S/p CABG S/P SVAR CAD AFIB-p Obesity Hyperlipidemia  . Plan Agree with admission  Supplemental O2  Continue Antibiotics for Pneumonia  Inhalers for congestion  IV lasix for possible mild CHF-D Echo to reassess prosthetic AoV D/C Amiodarone for possible in terstitial lung disease Recommend pulmonary input Continue supportive care  Full note to follow  Zaneta Lightcap

## 2021-10-26 NOTE — TOC Initial Note (Signed)
Transition of Care (TOC) - Initial/Assessment Note  ? ? ?Patient Details  ?Name: [place] ?MRN: [id number] ?Date of Birth: 29-Aug-1950 ? ?Transition of Care (TOC) CM/SW Contact:    ?Candie Chroman, LCSW ?Phone Number: ?10/26/2021, 2:52 PM ? ?Clinical Narrative:   Readmission prevention screen complete. CSW met with patient. Husband at bedside. CSW introduced role and explained that discharge planning would be discussed. PCP is Lelon Huh, MD. Patient drives herself to appointments. [facility] [address]. She reports difficulty affording Meloxicam. Sent secure chat to MD and pharmacist to notify and see if there are any cheaper alternatives. No home health prior to admission. She is active with cardiac rehab downstairs. No DME use prior to admission including oxygen. She is currently on 2 L acute. No further concerns. CSW encouraged patient and her husband to contact CSW as needed. CSW will continue to follow patient and her husband for support and facilitate return home when stable.              ? ?Expected Discharge Plan: Home/Self Care ?Barriers to Discharge: Continued Medical Work up ? ? ?Patient Goals and CMS Choice ?  ?  ?  ? ?Expected Discharge Plan and Services ?Expected Discharge Plan: Home/Self Care ?  ?  ?Post Acute Care Choice: Resumption of Svcs/PTA Provider ?Living arrangements for the past 2 months: Apartment ?                ?  ?  ?  ?  ?  ?  ?  ?  ?  ?  ? ?Prior Living Arrangements/Services ?Living arrangements for the past 2 months: Apartment ?Lives with:: Spouse ?Patient language and need for interpreter reviewed:: Yes ?Do you feel safe going back to the place where you live?: Yes      ?Need for Family Participation in Patient Care: Yes (Comment) ?Care giver support system in place?: Yes (comment) ?  ?Criminal Activity/Legal Involvement Pertinent to Current Situation/Hospitalization: No - Comment as needed ? ?Activities of Daily Living ?Home Assistive Devices/Equipment:  None ?ADL Screening (condition at time of admission) ?Patient's cognitive ability adequate to safely complete daily activities?: Yes ?Is the patient deaf or have difficulty hearing?: No ?Does the patient have difficulty seeing, even when wearing glasses/contacts?: No ?Does the patient have difficulty concentrating, remembering, or making decisions?: Yes ?Patient able to express need for assistance with ADLs?: Yes ?Does the patient have difficulty dressing or bathing?: No ?Independently performs ADLs?: Yes (appropriate for developmental age) ?Does the patient have difficulty walking or climbing stairs?: No ?Weakness of Legs: Both ?Weakness of Arms/Hands: None ? ?Permission Sought/Granted ?Permission sought to share information with : Family Supports ?Permission granted to share information with : Yes, Verbal Permission Granted ? Share Information with NAME: Marsa Matteo ?   ? Permission granted to share info w Relationship: Spouse ? Permission granted to share info w Contact Information: [phone] ? ?Emotional Assessment ?Appearance:: Appears stated age ?Attitude/Demeanor/Rapport: Engaged, Gracious ?Affect (typically observed): Accepting, Appropriate, Calm, Pleasant ?Orientation: : Oriented to Self, Oriented to Place, Oriented to  Time, Oriented to Situation ?Alcohol / Substance Use: Not Applicable ?Psych Involvement: No (comment) ? ?Admission diagnosis:  SOB (shortness of breath) [R06.02] ?Hypoxia [R09.02] ?Sepsis ([place]) [A41.9] ?Community acquired pneumonia of left lower lobe of lung [J18.9] ?Patient Active Problem List  ? Diagnosis Date Noted  ? Hypokalemia 10/26/2021  ? Sepsis ([place]) 10/25/2021  ? Pneumonia 10/25/2021  ? Acute respiratory failure with hypoxia ([place]) 10/25/2021  ? S/P  CABG x 1 09/19/21 10/25/2021  ? Paroxysmal atrial fibrillation, post CABG ([place]) 10/25/2021  ? 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 09/30/2021  ? Neuropathy 09/30/2021  ? S/P aortic valve replacement  09/19/2021  ? Cervical myelopathy ([place]) 11/15/2020  ? LVH (left ventricular hypertrophy) 10/14/2020  ? COVID-19 05/19/2020  ? MRSA cellulitis 04/19/2018  ? Decreased libido 02/04/2018  ? Diabetes mellitus with neuropathy ([place]) 04/20/2017  ? H/O adenomatous polyp of colon 08/20/2015  ? Rupture of implant of right breast 05/18/2015  ? Anxiety disorder 04/26/2015  ? Allergic rhinitis 04/21/2015  ? Normocytic anemia 04/21/2015  ? Asthma 04/21/2015  ? 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/21/2015  ? Chronic infection of sinus 04/21/2015  ? Depression 04/21/2015  ? GERD (gastroesophageal reflux disease) 04/21/2015  ? Headache 04/21/2015  ? Menopausal symptoms 04/21/2015  ? Obesity 04/21/2015  ? Palpitations 04/21/2015  ? Vitamin B12 deficiency 04/21/2015  ? Diastolic dysfunction 50/53/9767  ? Diabetes mellitus out of control 06/12/2006  ? Barrett esophagus 06/13/2003  ? Essential hypertension 06/12/1998  ? Genital herpes 06/12/1998  ? Hyperlipidemia, mixed 06/12/1998  ? ?PCP:  [name], MD ?Pharmacy:   ?[id number] [address] ?[place] ?[place] [address] ?Phone: [phone] Fax: [phone] ? ? ? ? ?Social Determinants of Health (SDOH) Interventions ?  ? ?Readmission Risk Interventions ? ?  10/26/2021  ?  2:51 PM  ?Readmission Risk Prevention Plan  ?Transportation Screening Complete  ?PCP or Specialist Appt within 3-5 Days Complete  ?Social Work Consult for [place] Planning/Counseling Complete  ?Palliative Care Screening Complete  ?Medication Review Press photographer) Referral to Pharmacy  ? ? ? ?

## 2021-10-27 ENCOUNTER — Inpatient Hospital Stay
Admit: 2021-10-27 | Discharge: 2021-10-27 | Disposition: A | Discharge status: Home / Self Care | Payer: PPO | Attending: Internal Medicine | Admitting: Internal Medicine

## 2021-10-27 ENCOUNTER — Inpatient Hospital Stay: Payer: PPO

## 2021-10-27 DIAGNOSIS — J9601 Acute respiratory failure with hypoxia: Secondary | ICD-10-CM | POA: Diagnosis not present

## 2021-10-27 DIAGNOSIS — A419 Sepsis, unspecified organism: Secondary | ICD-10-CM | POA: Diagnosis not present

## 2021-10-27 DIAGNOSIS — I5189 Other ill-defined heart diseases: Secondary | ICD-10-CM

## 2021-10-27 DIAGNOSIS — I480 Paroxysmal atrial fibrillation: Secondary | ICD-10-CM | POA: Diagnosis not present

## 2021-10-27 LAB — BLOOD GAS, ARTERIAL
Acid-Base Excess: 2 mmol/L (ref 0.0–2.0)
Bicarbonate: 25.6 mmol/L (ref 20.0–28.0)
O2 Saturation: 91.8 %
Patient temperature: 37
pCO2 arterial: 36 mmHg (ref 32–48)
pH, Arterial: 7.46 — ABNORMAL HIGH (ref 7.35–7.45)
pO2, Arterial: 58 mmHg — ABNORMAL LOW (ref 83–108)

## 2021-10-27 LAB — BASIC METABOLIC PANEL
Anion gap: 15 (ref 5–15)
BUN: 15 mg/dL (ref 8–23)
CO2: 23 mmol/L (ref 22–32)
Calcium: 8.6 mg/dL — ABNORMAL LOW (ref 8.9–10.3)
Chloride: 98 mmol/L (ref 98–111)
Creatinine, Ser: 0.6 mg/dL (ref 0.44–1.00)
GFR, Estimated: >60 mL/min (ref >60)
Glucose, Bld: 94 mg/dL (ref 70–99)
Potassium: 3.2 mmol/L — ABNORMAL LOW (ref 3.5–5.1)
Sodium: 136 mmol/L (ref 135–145)

## 2021-10-27 LAB — GLUCOSE, CAPILLARY
Glucose-Capillary: 125 mg/dL — ABNORMAL HIGH (ref 70–99)
Glucose-Capillary: 126 mg/dL — ABNORMAL HIGH (ref 70–99)
Glucose-Capillary: 152 mg/dL — ABNORMAL HIGH (ref 70–99)
Glucose-Capillary: 119 mg/dL — ABNORMAL HIGH (ref 70–99)

## 2021-10-27 LAB — PROLACTIN: Prolactin: 45.3 ng/mL — ABNORMAL HIGH (ref 4.8–23.3)

## 2021-10-27 LAB — ECHOCARDIOGRAM COMPLETE
AR max vel: 0.85 cm2
AV Area VTI: 1.04 cm2
AV Area mean vel: 0.83 cm2
AV Mean grad: 14 mmHg
AV Peak grad: 24.6 mmHg
Ao pk vel: 2.48 m/s
Area-P 1/2: 3.03 cm2
Height: 60 in
MV VTI: 1.02 cm2
S' Lateral: 2.78 cm
Weight: 2287.99 oz

## 2021-10-27 LAB — URINE CULTURE: Culture: NO GROWTH

## 2021-10-27 LAB — CBC
HCT: 31.6 % — ABNORMAL LOW (ref 36.0–46.0)
Hemoglobin: 9.8 g/dL — ABNORMAL LOW (ref 12.0–15.0)
MCH: 24.6 pg — ABNORMAL LOW (ref 26.0–34.0)
MCHC: 31 g/dL (ref 30.0–36.0)
MCV: 79.2 fL — ABNORMAL LOW (ref 80.0–100.0)
Platelets: 220 10*3/uL (ref 150–400)
RBC: 3.99 MIL/uL (ref 3.87–5.11)
RDW: 16.6 % — ABNORMAL HIGH (ref 11.5–15.5)
WBC: 14.4 10*3/uL — ABNORMAL HIGH (ref 4.0–10.5)
nRBC: 0 % (ref 0.0–0.2)

## 2021-10-27 MED ORDER — POTASSIUM CHLORIDE CRYS ER 20 MEQ PO TBCR
40.0000 meq | EXTENDED_RELEASE_TABLET | Freq: Two times a day (BID) | ORAL | Status: AC
  Administered 2021-10-27 (×2): 40 meq via ORAL
  Filled 2021-10-27 (×2): qty 2

## 2021-10-27 NOTE — Progress Notes (Signed)
Progress Note   Patient: Katie Buckley BHA:[id number] DOB: 1950-09-16 DOA: 10/25/2021     2 DOS: the patient was seen and examined on 10/27/2021   Brief hospital course: 71 y.o. female with -DM with neuropathy, HTN, diastolic CHF, severe aortic stenosis s/p TAVR on 09/19/2021 and s/p single-vessel CABG on 09/19/2021 with paroxysmal A-fib postop, currently on amiodarone  presents w/ 2-week history of shortness of breath, cough congestion and sore throat, now associated with fever up to 102, was prescribed amoxicillin by her cardiothoracic surgeon a few days prior but without improvement and her PCP ordered Omnicef a couple days ago but patient has not been taking it as prescribed PCP a few days prior on arrival of EMS O2 sat was 86% improving to the 90s on 4 L. In ED- TEMP-102.6 with pulse 71, respirations 34, BP 154/85 and O2 sat 89% on room air improving to 96% on 2 L. WBC of 8200 and lactic acid 0.8.  Hemoglobin 10.3.  CMP unremarkable, BNP 552.  COVID and flu negative.  CXR-bibasilar airspace opacities and small left effusion slightly increased from prior exam earlier on the same day.Patient given an IV fluid bolus started on Rocephin and azithromycin Hospitalist consulted for admission.  Pulmonology and cardiology consulted.  5/18 - pt with worsening hypoxia, spO2 of 84% on 6 L/min O2.  Placed on Bipap and transferred to Progressive unit.  Assessment and Plan: * Sepsis ([place]) Sepsis criteria includes fever, tachypnea, hypoxia and source of infection Continue sepsis fluids and treat pneumonia as outlined below  Acute respiratory failure with hypoxia ([place]) Patient was hypoxic to 86% with EMS requiring 4 L.  Tachypneic to 34 on arrival Supplemental O2 to keep sats over 94% and wean as tolerated  Pneumonia Continue Rocephin and azithromycin Follow-up MRSA screen to decide on vancomycin given recent hospitalization Antitussives, DuoNebs as needed Incentive spirometer Follow blood  cultures  Paroxysmal atrial fibrillation, post CABG ([facility]) Continue amiodarone and ASA 325.  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  Will hold metformin and glipizide.  Continue Jardiance  Essential hypertension Blood pressure under fair control.  Continue amlodipine and irbesartan and metoprolol  Diastolic dysfunction Does not appear acutely exacerbated though BNP is elevated to over 500 Monitor for fluid low overload in view of IV hydration for sepsis Daily weights with intake and output monitoring Continue Lasix, Avapro, metoprolol, Jardiance        Subjective: Pt seen after transfer to PCU this AM.  Sleeping on BiPAP but woke easily to voice.  Denies feeling short of breath, tolerating bipap okay.  No other acute complaints at this time.   Physical Exam: Vitals:   10/27/21 1155 10/27/21 1236 10/27/21 1315 10/27/21 1622  BP:    (!) 153/65  Pulse:    76  Resp:    20  Temp:    98.9 F (37.2 C)  TempSrc:    Oral  SpO2: 92% 91% 92% 90%  Weight:      Height:       General exam: awake but drowsy, no acute distress HEENT: bipap mask on, moist mucus membranes, hearing grossly normal  Respiratory system: CTAB diminished bases, on BiPAP, no accessory muscle use.  Cardiovascular system: normal S1/S2, RRR, no peripheral edema.   Gastrointestinal system: soft, NT, ND Central nervous system: no gross focal neurologic deficits, normal speech Extremities: no cyanosis, normal tone Skin: dry, intact, normal temperature Psychiatry: normal mood, congruent affect, judgement and insight appear normal   Data Reviewed:  Notable labs - ABG 7.46 / 36 / 58 / 25.6  (after on bipap for some time).  K 3.2, Ca 8.6, WBC 14.4,  Jbg 9.8,   Echo normal EF, grade II diastolic dysfunction  Family Communication: none, will attempt to call   Disposition: Status is: Inpatient Remains inpatient appropriate because: severity of illness, worsened hypoxic respiratory failure this AM    Planned Discharge Destination: Home    Time spent: 45 minutes  Author: Ezekiel Slocumb, DO 10/27/2021 6:39 PM  For on call review [email or web address].

## 2021-10-27 NOTE — Progress Notes (Signed)
[facility]    SUBJECTIVE: Respiratory failure on BiPAP denies any pain still short of breath no fever no nausea no acute electrolyte I do not know where it is.   Vitals:   10/27/21 1236 10/27/21 1315 10/27/21 1622 10/27/21 1948  BP:   (!) 153/65 (!) 166/59  Pulse:   76 81  Resp:   20 (!) 25  Temp:   98.9 F (37.2 C) 99.5 F (37.5 C)  TempSrc:   Oral Oral  SpO2: 91% 92% 90% 94%  Weight:      Height:         Intake/Output Summary (Last 24 hours) at 10/27/2021 2126 Last data filed at 10/27/2021 1833 Gross per 24 hour  Intake 590 ml  Output 1000 ml  Net -410 ml      PHYSICAL EXAM  General: Well developed, well nourished, in no acute distress HEENT:  Normocephalic and atramatic Neck:  No JVD.  Lungs: Diffuse rhonchi bilaterally to auscultation and percussion. Heart: HRRR . Normal S1 and S2 without gallops or murmurs.  Abdomen: Bowel sounds are positive, abdomen soft and non-tender  Msk:  Back normal, normal gait. Normal strength and tone for age. Extremities: No clubbing, cyanosis or edema.   Neuro: Alert and oriented X 3. Psych:  Good affect, responds appropriately   LABS: Basic Metabolic Panel: Recent Labs    10/26/21 0327 10/27/21 0542  NA 142 136  K 3.4* 3.2*  CL 107 98  CO2 27 23  GLUCOSE 70 94  BUN 10 15  CREATININE 0.53 0.60  CALCIUM 8.7* 8.6*   Liver Function Tests: Recent Labs    10/25/21 2038  AST 28  ALT 17  ALKPHOS 99  BILITOT 0.5  PROT 7.0  ALBUMIN 3.8   No results for input(s): LIPASE, AMYLASE in the last 72 hours. CBC: Recent Labs    10/25/21 2038 10/26/21 0327 10/27/21 0542  WBC 8.2 8.4 14.4*  NEUTROABS 6.5  --   --   HGB 10.3* 9.0* 9.8*  HCT 33.2* 29.1* 31.6*  MCV 79.6* 80.4 79.2*  PLT 185 165 220   Cardiac Enzymes: No results for input(s): CKTOTAL, CKMB, CKMBINDEX, TROPONINI in the last 72 hours. BNP: Invalid input(s): POCBNP D-Dimer: No results for input(s): DDIMER in the last 72 hours. Hemoglobin A1C: No  results for input(s): HGBA1C in the last 72 hours. Fasting Lipid Panel: No results for input(s): CHOL, HDL, LDLCALC, TRIG, CHOLHDL, LDLDIRECT in the last 72 hours. Thyroid Function Tests: No results for input(s): TSH, T4TOTAL, T3FREE, THYROIDAB in the last 72 hours.  Invalid input(s): FREET3 Anemia Panel: No results for input(s): VITAMINB12, FOLATE, FERRITIN, TIBC, IRON, RETICCTPCT in the last 72 hours.  DG Chest Port 1 View  Result Date: 10/27/2021 CLINICAL DATA:  71 year old female with shortness of breath. EXAM: PORTABLE CHEST 1 VIEW COMPARISON:  Portable chest 10/26/2021 and earlier. FINDINGS: Portable AP semi upright view at 0725 hours. Stable lung volumes and mediastinal contours. Incidental calcified breast implants. Prior sternotomy and cardiac valve replacement. Visualized tracheal air column is within normal limits. No pneumothorax. Ongoing patchy and asymmetric bilateral pulmonary opacity, confluent in both the right upper and lower lungs now. Less veiling opacity at the left lung base although small left pleural effusion cannot be excluded. Pulmonary vascularity does not appear significantly changed over the recent series of exams. No right pleural effusion. No acute osseous abnormality identified. Right upper quadrant cholecystectomy clips. Negative visible bowel gas. IMPRESSION: Continued progression of right greater than left Patchy and  confluent pulmonary opacity. This more resembles multifocal pneumonia than asymmetric pulmonary edema. Questionable small left pleural effusion. Electronically Signed   By: Odessa Fleming M.D.   On: 10/27/2021 07:45   DG Chest Port 1 View  Result Date: 10/26/2021 CLINICAL DATA:  Shortness of breath EXAM: PORTABLE CHEST 1 VIEW COMPARISON:  Previous day FINDINGS: Stable enlargement of the heart. Probable tiny left pleural effusion. No large right pleural effusion. No pneumothorax. Worsening bilateral right-greater-than-left interstitial and hazy opacities.  Median sternotomy wires. IMPRESSION: Worsening bilateral interstitial and hazy opacities, edema versus multifocal infection. Electronically Signed   By: Olive Bass M.D.   On: 10/26/2021 13:51   ECHOCARDIOGRAM COMPLETE  Result Date: 10/27/2021    ECHOCARDIOGRAM REPORT   Patient Name:   Katie Buckley Date of Exam: 10/27/2021 Medical Rec #:  [id number]         Height:       60.0 in Accession #:    [id number]        Weight:       143.0 lb Date of Birth:  01-28-1951          BSA:          1.619 m Patient Age:    71 years          BP:           165/63 mmHg Patient Gender: F                 HR:           82 bpm. Exam Location:  [facility] Procedure: 2D Echo, Color Doppler and Cardiac Doppler Indications:     I50.31 congestive heart failure-Acute Diastolic  History:         Patient has prior history of Echocardiogram examinations, most                  recent 09/19/2021. Prior CABG, Aortic Valve Disease; Risk                  Factors:HCL and Diabetes.  Sonographer:     Humphrey Rolls Referring Phys:  [id number] Tresa Endo A GRIFFITH Diagnosing Phys: Alwyn Pea MD  Sonographer Comments: Image acquisition challenging due to breast implants. IMPRESSIONS  1. Left ventricular ejection fraction, by estimation, is 55 to 60%. The left ventricle has normal function. The left ventricle demonstrates regional wall motion abnormalities (see scoring diagram/findings for description). Left ventricular diastolic parameters are consistent with Grade II diastolic dysfunction (pseudonormalization).  2. Right ventricular systolic function is normal. The right ventricular size is normal.  3. The mitral valve is normal in structure. Trivial mitral valve regurgitation.  4. The aortic valve is normal in structure. Aortic valve regurgitation is not visualized. FINDINGS  Left Ventricle: Left ventricular ejection fraction, by estimation, is 55 to 60%. The left ventricle has normal function. The left ventricle demonstrates regional wall motion  abnormalities. The left ventricular internal cavity size was normal in size. There is no left ventricular hypertrophy. Left ventricular diastolic parameters are consistent with Grade II diastolic dysfunction (pseudonormalization). Right Ventricle: The right ventricular size is normal. No increase in right ventricular wall thickness. Right ventricular systolic function is normal. Left Atrium: Left atrial size was normal in size. Right Atrium: Right atrial size was normal in size. Pericardium: There is no evidence of pericardial effusion. Mitral Valve: The mitral valve is normal in structure. Trivial mitral valve regurgitation. MV peak gradient, 13.2 mmHg. The mean mitral valve gradient is 6.0 mmHg.  Tricuspid Valve: The tricuspid valve is normal in structure. Tricuspid valve regurgitation is trivial. Aortic Valve: The aortic valve is normal in structure. Aortic valve regurgitation is not visualized. Aortic valve mean gradient measures 14.0 mmHg. Aortic valve peak gradient measures 24.6 mmHg. Aortic valve area, by VTI measures 1.04 cm. Pulmonic Valve: The pulmonic valve was normal in structure. Pulmonic valve regurgitation is not visualized. Aorta: The ascending aorta was not well visualized. IAS/Shunts: No atrial level shunt detected by color flow Doppler.  LEFT VENTRICLE PLAX 2D LVIDd:         3.87 cm   Diastology LVIDs:         2.78 cm   LV e' medial:    3.26 cm/s LV PW:         1.13 cm   LV E/e' medial:  59.5 LV IVS:        0.90 cm   LV e' lateral:   6.74 cm/s LVOT diam:     1.40 cm   LV E/e' lateral: 28.8 LV SV:         45 LV SV Index:   28 LVOT Area:     1.54 cm  LEFT ATRIUM             Index LA diam:        3.70 cm 2.29 cm/m LA Vol (A2C):   40.7 ml 25.15 ml/m LA Vol (A4C):   57.4 ml 35.46 ml/m LA Biplane Vol: 49.9 ml 30.83 ml/m  AORTIC VALVE                     PULMONIC VALVE AV Area (Vmax):    0.85 cm      PV Vmax:       1.33 m/s AV Area (Vmean):   0.83 cm      PV Vmean:      88.800 cm/s AV Area (VTI):      1.04 cm      PV VTI:        0.234 m AV Vmax:           248.00 cm/s   PV Peak grad:  7.1 mmHg AV Vmean:          180.000 cm/s  PV Mean grad:  3.0 mmHg AV VTI:            0.433 m AV Peak Grad:      24.6 mmHg AV Mean Grad:      14.0 mmHg LVOT Vmax:         137.00 cm/s LVOT Vmean:        96.700 cm/s LVOT VTI:          0.293 m LVOT/AV VTI ratio: 0.68  AORTA Ao Root diam: 2.00 cm MITRAL VALVE MV Area (PHT): 3.03 cm     SHUNTS MV Area VTI:   1.02 cm     Systemic VTI:  0.29 m MV Peak grad:  13.2 mmHg    Systemic Diam: 1.40 cm MV Mean grad:  6.0 mmHg MV Vmax:       1.82 m/s MV Vmean:      114.0 cm/s MV Decel Time: 250 msec MV E velocity: 194.00 cm/s MV A velocity: 147.00 cm/s MV E/A ratio:  1.32 Prosper Paff D Mckensi Redinger MD Electronically signed by Alwyn Pea MD Signature Date/Time: 10/27/2021/4:57:58 PM    Final      Echo preserved overall left ventricular function and well-seated bioprosthetic aortic valve  TELEMETRY: Normal sinus  rhythm nonspecific ST-T wave changes presumed left well-seated   ASSESSMENT AND PLAN:  Principal Problem:   Sepsis (HCC) Active Problems:   Normocytic anemia   Diastolic dysfunction   Essential hypertension   Diabetes mellitus with neuropathy (HCC)   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   Pneumonia   Acute respiratory failure with hypoxia (HCC)   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

## 2021-10-27 NOTE — Progress Notes (Signed)
*  PRELIMINARY RESULTS* Echocardiogram 2D Echocardiogram has been performed.  Katie Buckley 10/27/2021, 1:42 PM

## 2021-10-28 DIAGNOSIS — I5033 Acute on chronic diastolic (congestive) heart failure: Secondary | ICD-10-CM

## 2021-10-28 DIAGNOSIS — E876 Hypokalemia: Secondary | ICD-10-CM

## 2021-10-28 DIAGNOSIS — B348 Other viral infections of unspecified site: Secondary | ICD-10-CM | POA: Diagnosis present

## 2021-10-28 DIAGNOSIS — J9601 Acute respiratory failure with hypoxia: Secondary | ICD-10-CM | POA: Diagnosis not present

## 2021-10-28 LAB — CBC
HCT: 32.7 % — ABNORMAL LOW (ref 36.0–46.0)
Hemoglobin: 10 g/dL — ABNORMAL LOW (ref 12.0–15.0)
MCH: 24.2 pg — ABNORMAL LOW (ref 26.0–34.0)
MCHC: 30.6 g/dL (ref 30.0–36.0)
MCV: 79 fL — ABNORMAL LOW (ref 80.0–100.0)
Platelets: 241 10*3/uL (ref 150–400)
RBC: 4.14 MIL/uL (ref 3.87–5.11)
RDW: 16.7 % — ABNORMAL HIGH (ref 11.5–15.5)
WBC: 15.5 10*3/uL — ABNORMAL HIGH (ref 4.0–10.5)
nRBC: 0 % (ref 0.0–0.2)

## 2021-10-28 LAB — BASIC METABOLIC PANEL
Anion gap: 16 — ABNORMAL HIGH (ref 5–15)
BUN: 25 mg/dL — ABNORMAL HIGH (ref 8–23)
CO2: 22 mmol/L (ref 22–32)
Calcium: 8.7 mg/dL — ABNORMAL LOW (ref 8.9–10.3)
Chloride: 100 mmol/L (ref 98–111)
Creatinine, Ser: 0.7 mg/dL (ref 0.44–1.00)
GFR, Estimated: >60 mL/min (ref >60)
Glucose, Bld: 147 mg/dL — ABNORMAL HIGH (ref 70–99)
Potassium: 3.5 mmol/L (ref 3.5–5.1)
Sodium: 138 mmol/L (ref 135–145)

## 2021-10-28 LAB — GLUCOSE, CAPILLARY
Glucose-Capillary: 133 mg/dL — ABNORMAL HIGH (ref 70–99)
Glucose-Capillary: 253 mg/dL — ABNORMAL HIGH (ref 70–99)
Glucose-Capillary: 150 mg/dL — ABNORMAL HIGH (ref 70–99)
Glucose-Capillary: 146 mg/dL — ABNORMAL HIGH (ref 70–99)

## 2021-10-28 LAB — MAGNESIUM: Magnesium: 2.1 mg/dL (ref 1.7–2.4)

## 2021-10-28 MED ORDER — NYSTATIN 100000 UNIT/GM EX POWD
Freq: Three times a day (TID) | CUTANEOUS | Status: DC
  Filled 2021-10-28: qty 15

## 2021-10-28 MED ORDER — ACETAMINOPHEN 650 MG RE SUPP
650.0000 mg | Freq: Once | RECTAL | Status: AC
  Administered 2021-10-28: 650 mg via RECTAL
  Filled 2021-10-28: qty 1

## 2021-10-28 NOTE — Progress Notes (Signed)
Patient ripped off BIPAP x2 times related to discomfort and oxygen saturations quickly drop to 60s/70s. BIPAP is on and patient educated about the importance of BIPAP needing to remain on for proper oxygenation.

## 2021-10-28 NOTE — Progress Notes (Signed)
Progress Note   Patient: Katie Buckley:[id number] DOB: 01-Jun-1951 DOA: 10/25/2021     3 DOS: the patient was seen and examined on 10/28/2021   Brief hospital course: 71 y.o. female with -DM with neuropathy, HTN, diastolic CHF, severe aortic stenosis s/p TAVR on 09/19/2021 and s/p single-vessel CABG on 09/19/2021 with paroxysmal A-fib postop, currently on amiodarone  presents w/ 2-week history of shortness of breath, cough congestion and sore throat, now associated with fever up to 102, was prescribed amoxicillin by her cardiothoracic surgeon a few days prior but without improvement and her PCP ordered Omnicef a couple days ago but patient has not been taking it as prescribed PCP a few days prior on arrival of EMS O2 sat was 86% improving to the 90s on 4 L. In ED- TEMP-102.6 with pulse 71, respirations 34, BP 154/85 and O2 sat 89% on room air improving to 96% on 2 L. WBC of 8200 and lactic acid 0.8.  Hemoglobin 10.3.  CMP unremarkable, BNP 552.  COVID and flu negative.  CXR-bibasilar airspace opacities and small left effusion slightly increased from prior exam earlier on the same day.Patient given an IV fluid bolus started on Rocephin and azithromycin Hospitalist consulted for admission.  Pulmonology and cardiology consulted.  5/18 - pt with worsening hypoxia, spO2 of 84% on 6 L/min O2.  Placed on Bipap and transferred to Progressive unit.  Assessment and Plan: * Sepsis (Page) Sepsis criteria includes fever, tachypnea, hypoxia and source of infection Continue sepsis fluids and treat pneumonia as outlined below  Acute respiratory failure with hypoxia ([place]) Patient was hypoxic to 86% with EMS requiring 4 L.  Tachypneic to 34 on arrival Supplemental O2 to keep sats over 94% and wean as tolerated  5/19: On BiPAP early this morning, weaned to 8 L/min HFNC oxygen  Pneumonia Continue Rocephin and azithromycin MRSA screen negative. Antitussives, DuoNebs as needed Incentive spirometer Follow  blood cultures Supplemental oxygen as needed  Rhinovirus infection Respiratory viral panel positive for rhinovirus.  Covering forpossible secondary bacterial pneumonia as outlined. Supportive care per orders. Droplet precautions.  Hypokalemia Potassium normalized with replacement.  Monitor BMP replace K as needed  Paroxysmal atrial fibrillation, post CABG (HCC) Continue amiodarone and ASA 325.  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  Will hold metformin and glipizide.  Continue Jardiance  Essential hypertension Blood pressure under fair control.  Continue amlodipine and irbesartan and metoprolol  Acute on chronic heart failure with preserved ejection fraction (HFpEF) ([place]) Patient was not overtly decompensated on admission, however BNP elevated to over 500.  She was treated with IV fluids per sepsis protocol, subsequently developed worsening hypoxic respiratory failure requiring BiPAP --Continue IV Lasix 40 mg twice daily --Strict I/O's and daily weights --Monitor renal function and electrolytes --Continue Avapro, metoprolol, Jardiance  Echo 5/18 with preserved EF, grade 2 diastolic dysfunction.  Normocytic anemia Hemoglobin stable.  Monitor CBC        Subjective: Pt awake sitting up in bed when seen this morning.  BiPAP mask was taken off earlier when cardiology present around on her.  She is currently on 8 L/min HFNC with sats during my encounter ranging from 87 to 91%.  She notes  a productive cough and headache this morning but overall feels better.  No acute events reported.  Physical Exam: Vitals:   10/28/21 0816 10/28/21 1119 10/28/21 1125  10/28/21 1549  BP:  (!) 149/60    Pulse: 90 76 70 75  Resp: 20 (!) 24 (!) 28 (!) 21  Temp:  97.8 F (36.6 C)    TempSrc:  Oral    SpO2: 92% 92% (!) 88% (!) 86%  Weight:      Height:       General exam: awake but drowsy, no acute distress HEENT: Nasal cannula in place, moist mucus membranes, hearing grossly normal  Respiratory system: Lungs clear with diminished bases, on 8 L/min HFNC O2, no accessory muscle use. Cardiovascular system: Regular rate and rhythm, no peripheral edema.   Gastrointestinal system: Abdomen soft nondistended nontender Central nervous system: Alert and oriented x3, grossly nonfocal exam, normal speech Extremities: No edema, normal tone, no cyanosis or gross deformities noted Skin: Dry, intact, no rashes seen on visualized skin Psychiatry: normal mood, congruent affect, judgement and insight appear normal   Data Reviewed:  Notable labs: Glucose 147, BUN 25, calcium 8.7, anion gap 16 (bicarb is normal), WBC 15.5,    Family Communication: none, will attempt to call   Disposition: Status is: Inpatient Remains inpatient appropriate because: severity of illness, remains with significant oxygen requirements, on IV therapies    Planned Discharge Destination: Home    Time spent: 35 minutes  Author: Ezekiel Slocumb, DO 10/28/2021 4:42 PM  For on call review [email or web address].

## 2021-10-28 NOTE — Assessment & Plan Note (Signed)
Hemoglobin stable Monitor CBC. 

## 2021-10-28 NOTE — Progress Notes (Signed)
[facility]    SUBJECTIVE: Patient feels a lot better improved mental status more alert more oriented respiratory status improved patient asked to try a course of BiPAP since she felt better denies any chest pain no fever no palpitations tachycardia   Vitals:   10/28/21 0428 10/28/21 0432 10/28/21 0803 10/28/21 0816  BP: (!) 135/51  133/61   Pulse: 73  79 90  Resp: 20  (!) 22 20  Temp: 97.7 F (36.5 C)  97.9 F (36.6 C)   TempSrc: Oral  Oral   SpO2: 95%  91% 92%  Weight:  67.4 kg    Height:         Intake/Output Summary (Last 24 hours) at 10/28/2021 0845 Last data filed at 10/27/2021 1833 Gross per 24 hour  Intake 240 ml  Output 1000 ml  Net -760 ml      PHYSICAL EXAM  General: Well developed, well nourished, in no acute distress HEENT:  Normocephalic and atramatic Neck:  No JVD.  Lungs: Clear bilaterally to auscultation and percussion. Heart: HRRR . Normal S1 and S2 without gallops or murmurs.  Abdomen: Bowel sounds are positive, abdomen soft and non-tender  Msk:  Back normal, normal gait. Normal strength and tone for age. Extremities: No clubbing, cyanosis or edema.   Neuro: Alert and oriented X 3. Psych:  Good affect, responds appropriately   LABS: Basic Metabolic Panel: Recent Labs    10/27/21 0542 10/28/21 0634  NA 136 138  K 3.2* 3.5  CL 98 100  CO2 23 22  GLUCOSE 94 147*  BUN 15 25*  CREATININE 0.60 0.70  CALCIUM 8.6* 8.7*  MG  --  2.1   Liver Function Tests: Recent Labs    10/25/21 2038  AST 28  ALT 17  ALKPHOS 99  BILITOT 0.5  PROT 7.0  ALBUMIN 3.8   No results for input(s): LIPASE, AMYLASE in the last 72 hours. CBC: Recent Labs    10/25/21 2038 10/26/21 0327 10/27/21 0542 10/28/21 0634  WBC 8.2   < > 14.4* 15.5*  NEUTROABS 6.5  --   --   --   HGB 10.3*   < > 9.8* 10.0*  HCT 33.2*   < > 31.6* 32.7*  MCV 79.6*   < > 79.2* 79.0*  PLT 185   < > 220 241   < > = values in this interval not displayed.   Cardiac Enzymes: No  results for input(s): CKTOTAL, CKMB, CKMBINDEX, TROPONINI in the last 72 hours. BNP: Invalid input(s): POCBNP Buckley-Dimer: No results for input(s): DDIMER in the last 72 hours. Hemoglobin A1C: No results for input(s): HGBA1C in the last 72 hours. Fasting Lipid Panel: No results for input(s): CHOL, HDL, LDLCALC, TRIG, CHOLHDL, LDLDIRECT in the last 72 hours. Thyroid Function Tests: No results for input(s): TSH, T4TOTAL, T3FREE, THYROIDAB in the last 72 hours.  Invalid input(s): FREET3 Anemia Panel: No results for input(s): VITAMINB12, FOLATE, FERRITIN, TIBC, IRON, RETICCTPCT in the last 72 hours.  DG Chest Port 1 View  Result Date: 10/27/2021 CLINICAL DATA:  71 year old female with shortness of breath. EXAM: PORTABLE CHEST 1 VIEW COMPARISON:  Portable chest 10/26/2021 and earlier. FINDINGS: Portable AP semi upright view at 0725 hours. Stable lung volumes and mediastinal contours. Incidental calcified breast implants. Prior sternotomy and cardiac valve replacement. Visualized tracheal air column is within normal limits. No pneumothorax. Ongoing patchy and asymmetric bilateral pulmonary opacity, confluent in both the right upper and lower lungs now. Less veiling opacity at the  left lung base although small left pleural effusion cannot be excluded. Pulmonary vascularity does not appear significantly changed over the recent series of exams. No right pleural effusion. No acute osseous abnormality identified. Right upper quadrant cholecystectomy clips. Negative visible bowel gas. IMPRESSION: Continued progression of right greater than left Patchy and confluent pulmonary opacity. This more resembles multifocal pneumonia than asymmetric pulmonary edema. Questionable small left pleural effusion. Electronically Signed   By: Odessa Fleming M.Buckley.   On: 10/27/2021 07:45   DG Chest Port 1 View  Result Date: 10/26/2021 CLINICAL DATA:  Shortness of breath EXAM: PORTABLE CHEST 1 VIEW COMPARISON:  Previous day FINDINGS:  Stable enlargement of the heart. Probable tiny left pleural effusion. No large right pleural effusion. No pneumothorax. Worsening bilateral right-greater-than-left interstitial and hazy opacities. Median sternotomy wires. IMPRESSION: Worsening bilateral interstitial and hazy opacities, edema versus multifocal infection. Electronically Signed   By: Olive Bass M.Buckley.   On: 10/26/2021 13:51   ECHOCARDIOGRAM COMPLETE  Result Date: 10/27/2021    ECHOCARDIOGRAM REPORT   Patient Name:   Katie Buckley Date of Exam: 10/27/2021 Medical Rec #:  [id number]         Height:       60.0 in Accession #:    [id number]        Weight:       143.0 lb Date of Birth:  [birth year]          BSA:          1.619 m Patient Age:    71 years          BP:           165/63 mmHg Patient Gender: F                 HR:           82 bpm. Exam Location:  [facility] Procedure: 2D Echo, Color Doppler and Cardiac Doppler Indications:     I50.31 congestive heart failure-Acute Diastolic  History:         Patient has prior history of Echocardiogram examinations, most                  recent 09/19/2021. Prior CABG, Aortic Valve Disease; Risk                  Factors:HCL and Diabetes.  Sonographer:     Humphrey Rolls Referring Phys:  [id number] Tresa Endo A GRIFFITH Diagnosing Phys: Alwyn Pea MD  Sonographer Comments: Image acquisition challenging due to breast implants. IMPRESSIONS  1. Left ventricular ejection fraction, by estimation, is 55 to 60%. The left ventricle has normal function. The left ventricle demonstrates regional wall motion abnormalities (see scoring diagram/findings for description). Left ventricular diastolic parameters are consistent with Grade II diastolic dysfunction (pseudonormalization).  2. Right ventricular systolic function is normal. The right ventricular size is normal.  3. The mitral valve is normal in structure. Trivial mitral valve regurgitation.  4. The aortic valve is normal in structure. Aortic valve regurgitation is not  visualized. FINDINGS  Left Ventricle: Left ventricular ejection fraction, by estimation, is 55 to 60%. The left ventricle has normal function. The left ventricle demonstrates regional wall motion abnormalities. The left ventricular internal cavity size was normal in size. There is no left ventricular hypertrophy. Left ventricular diastolic parameters are consistent with Grade II diastolic dysfunction (pseudonormalization). Right Ventricle: The right ventricular size is normal. No increase in right ventricular wall thickness. Right ventricular systolic function is normal.  Left Atrium: Left atrial size was normal in size. Right Atrium: Right atrial size was normal in size. Pericardium: There is no evidence of pericardial effusion. Mitral Valve: The mitral valve is normal in structure. Trivial mitral valve regurgitation. MV peak gradient, 13.2 mmHg. The mean mitral valve gradient is 6.0 mmHg. Tricuspid Valve: The tricuspid valve is normal in structure. Tricuspid valve regurgitation is trivial. Aortic Valve: The aortic valve is normal in structure. Aortic valve regurgitation is not visualized. Aortic valve mean gradient measures 14.0 mmHg. Aortic valve peak gradient measures 24.6 mmHg. Aortic valve area, by VTI measures 1.04 cm. Pulmonic Valve: The pulmonic valve was normal in structure. Pulmonic valve regurgitation is not visualized. Aorta: The ascending aorta was not well visualized. IAS/Shunts: No atrial level shunt detected by color flow Doppler.  LEFT VENTRICLE PLAX 2D LVIDd:         3.87 cm   Diastology LVIDs:         2.78 cm   LV e' medial:    3.26 cm/s LV PW:         1.13 cm   LV E/e' medial:  59.5 LV IVS:        0.90 cm   LV e' lateral:   6.74 cm/s LVOT diam:     1.40 cm   LV E/e' lateral: 28.8 LV SV:         45 LV SV Index:   28 LVOT Area:     1.54 cm  LEFT ATRIUM             Index LA diam:        3.70 cm 2.29 cm/m LA Vol (A2C):   40.7 ml 25.15 ml/m LA Vol (A4C):   57.4 ml 35.46 ml/m LA Biplane Vol: 49.9  ml 30.83 ml/m  AORTIC VALVE                     PULMONIC VALVE AV Area (Vmax):    0.85 cm      PV Vmax:       1.33 m/s AV Area (Vmean):   0.83 cm      PV Vmean:      88.800 cm/s AV Area (VTI):     1.04 cm      PV VTI:        0.234 m AV Vmax:           248.00 cm/s   PV Peak grad:  7.1 mmHg AV Vmean:          180.000 cm/s  PV Mean grad:  3.0 mmHg AV VTI:            0.433 m AV Peak Grad:      24.6 mmHg AV Mean Grad:      14.0 mmHg LVOT Vmax:         137.00 cm/s LVOT Vmean:        96.700 cm/s LVOT VTI:          0.293 m LVOT/AV VTI ratio: 0.68  AORTA Ao Root diam: 2.00 cm MITRAL VALVE MV Area (PHT): 3.03 cm     SHUNTS MV Area VTI:   1.02 cm     Systemic VTI:  0.29 m MV Peak grad:  13.2 mmHg    Systemic Diam: 1.40 cm MV Mean grad:  6.0 mmHg MV Vmax:       1.82 m/s MV Vmean:      114.0 cm/s MV Decel Time: 250 msec MV E  velocity: 194.00 cm/s MV A velocity: 147.00 cm/s MV E/A ratio:  1.32 Katie Buckley Katie Vanderlinden MD Electronically signed by Alwyn Pea MD Signature Date/Time: 10/27/2021/4:57:58 PM    Final      Echo preserved left ventricular function EF of 55% well-seated aortic bioprosthetic valve  TELEMETRY: Sinus rhythm nonspecific ST-T wave changes:  ASSESSMENT AND PLAN:  Principal Problem:   Sepsis (HCC) Active Problems:   Normocytic anemia   Diastolic dysfunction   Essential hypertension   Diabetes mellitus with neuropathy (HCC)   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   Pneumonia   Acute respiratory failure with hypoxia (HCC)   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   Paroxysmal atrial fibrillation, post CABG (HCC)   Hypokalemia    Plan Patient much improved from respiratory standpoint and mental status We will try a course on nasal cannula and temporal discontinue BiPAP to see if the patient can tolerate maintain adequate oxygenation Continue antibiotic therapy Maintain anticoagulation for atrial fibrillation Agree with diabetes management and control If hypoxemia persistent  on nasal cannula 4 or 6 L will consider restarting BiPAP or facemask Patient appears to be steadily improving but still somewhat hypoxic on 6 L her sats were in the low 90s the patient felt more comfortable with nasal cannula   Alwyn Pea, MD, 10/28/2021 8:45 AM

## 2021-10-28 NOTE — Progress Notes (Signed)
Patient does not able to maintain O2 saturations on HFNC, placed on BiPAP, daughter in room removing BiPAP off and on several times since placement at 6 PM. Patient O2 saturations drop quickly to 70's. Daughter educated about importance of BiPAP and its need to remain on patient at this time.

## 2021-10-28 NOTE — Assessment & Plan Note (Signed)
Respiratory viral panel positive for rhinovirus.  Covering forpossible secondary bacterial pneumonia as outlined. Supportive care per orders. Droplet precautions.

## 2021-10-28 NOTE — Assessment & Plan Note (Addendum)
Potassium normalized with replacement.  Monitor BMP replace K as needed  K today 4.1 normal

## 2021-10-28 NOTE — Progress Notes (Signed)
Pt had to be placed back on bipap. Sats were sustaining in the low 80s/ high 70s on 15 L. Pt currently on bipap; Daughter in the room with pt and removing bipap at her discretion. Says she works at [employer]. Knows how to place it back on properly.

## 2021-10-29 ENCOUNTER — Inpatient Hospital Stay: Payer: PPO

## 2021-10-29 DIAGNOSIS — B348 Other viral infections of unspecified site: Secondary | ICD-10-CM | POA: Diagnosis not present

## 2021-10-29 DIAGNOSIS — I5033 Acute on chronic diastolic (congestive) heart failure: Secondary | ICD-10-CM | POA: Diagnosis not present

## 2021-10-29 DIAGNOSIS — J9601 Acute respiratory failure with hypoxia: Secondary | ICD-10-CM | POA: Diagnosis not present

## 2021-10-29 DIAGNOSIS — J189 Pneumonia, unspecified organism: Secondary | ICD-10-CM | POA: Diagnosis not present

## 2021-10-29 LAB — BASIC METABOLIC PANEL
Anion gap: 18 — ABNORMAL HIGH (ref 5–15)
BUN: 22 mg/dL (ref 8–23)
CO2: 23 mmol/L (ref 22–32)
Calcium: 8.8 mg/dL — ABNORMAL LOW (ref 8.9–10.3)
Chloride: 96 mmol/L — ABNORMAL LOW (ref 98–111)
Creatinine, Ser: 0.66 mg/dL (ref 0.44–1.00)
GFR, Estimated: >60 mL/min (ref >60)
Glucose, Bld: 138 mg/dL — ABNORMAL HIGH (ref 70–99)
Potassium: 2.8 mmol/L — ABNORMAL LOW (ref 3.5–5.1)
Sodium: 137 mmol/L (ref 135–145)

## 2021-10-29 LAB — GLUCOSE, CAPILLARY
Glucose-Capillary: 143 mg/dL — ABNORMAL HIGH (ref 70–99)
Glucose-Capillary: 327 mg/dL — ABNORMAL HIGH (ref 70–99)
Glucose-Capillary: 253 mg/dL — ABNORMAL HIGH (ref 70–99)
Glucose-Capillary: 176 mg/dL — ABNORMAL HIGH (ref 70–99)

## 2021-10-29 LAB — CBC
HCT: 35.6 % — ABNORMAL LOW (ref 36.0–46.0)
Hemoglobin: 10.7 g/dL — ABNORMAL LOW (ref 12.0–15.0)
MCH: 23.8 pg — ABNORMAL LOW (ref 26.0–34.0)
MCHC: 30.1 g/dL (ref 30.0–36.0)
MCV: 79.3 fL — ABNORMAL LOW (ref 80.0–100.0)
Platelets: 283 10*3/uL (ref 150–400)
RBC: 4.49 MIL/uL (ref 3.87–5.11)
RDW: 16.8 % — ABNORMAL HIGH (ref 11.5–15.5)
WBC: 16.5 10*3/uL — ABNORMAL HIGH (ref 4.0–10.5)
nRBC: 0 % (ref 0.0–0.2)

## 2021-10-29 LAB — PROCALCITONIN: Procalcitonin: 1.19 ng/mL

## 2021-10-29 MED ORDER — BIOTENE DRY MOUTH MT LIQD
15.0000 mL | OROMUCOSAL | Status: DC | PRN
  Administered 2021-10-29: 15 mL via OROMUCOSAL

## 2021-10-29 MED ORDER — POTASSIUM CHLORIDE CRYS ER 20 MEQ PO TBCR
40.0000 meq | EXTENDED_RELEASE_TABLET | ORAL | Status: AC
  Administered 2021-10-29 (×3): 40 meq via ORAL
  Filled 2021-10-29 (×3): qty 2

## 2021-10-29 MED ORDER — ONDANSETRON HCL 4 MG/2ML IJ SOLN
4.0000 mg | Freq: Four times a day (QID) | INTRAMUSCULAR | Status: DC | PRN
  Administered 2021-10-29 – 2021-10-30 (×2): 4 mg via INTRAVENOUS
  Filled 2021-10-29 (×2): qty 2

## 2021-10-29 MED ORDER — SALINE SPRAY 0.65 % NA SOLN
1.0000 | NASAL | Status: DC | PRN
  Administered 2021-10-29: 1 via NASAL
  Filled 2021-10-29: qty 44

## 2021-10-29 MED ORDER — DM-GUAIFENESIN ER 30-600 MG PO TB12
1.0000 | ORAL_TABLET | Freq: Two times a day (BID) | ORAL | Status: DC
  Administered 2021-10-29 – 2021-11-02 (×8): 1 via ORAL
  Filled 2021-10-29 (×8): qty 1

## 2021-10-29 NOTE — Progress Notes (Signed)
[facility]    SUBJECTIVE: Patient still with respiratory failure on BiPAP unable to tolerate nasal cannula because of hypoxemia denies any pain shortness of breath is somewhat improved resting comfortably in bed   Vitals:   10/29/21 0400 10/29/21 0444 10/29/21 0512 10/29/21 0514  BP:    (!) 172/71  Pulse: 87   88  Resp: 19   (!) 23  Temp:    97.7 F (36.5 C)  TempSrc:    Axillary  SpO2: 96%  96% 96%  Weight:  66.5 kg    Height:         Intake/Output Summary (Last 24 hours) at 10/29/2021 0705 Last data filed at 10/29/2021 0446 Gross per 24 hour  Intake 1550 ml  Output 2350 ml  Net -800 ml      PHYSICAL EXAM  General: Well developed, well nourished, in no acute distress HEENT:  Normocephalic and atramatic Neck:  No JVD.  Lungs: Diffuse rhonchi bilaterally to auscultation and percussion. Heart: HRRR . Normal S1 and S2 without gallops or murmurs.  Abdomen: Bowel sounds are positive, abdomen soft and non-tender  Msk:  Back normal, normal gait. Normal strength and tone for age. Extremities: No clubbing, cyanosis or edema.   Neuro: Alert and oriented X 3. Psych:  Good affect, responds appropriately   LABS: Basic Metabolic Panel: Recent Labs    10/27/21 0542 10/28/21 0634  NA 136 138  K 3.2* 3.5  CL 98 100  CO2 23 22  GLUCOSE 94 147*  BUN 15 25*  CREATININE 0.60 0.70  CALCIUM 8.6* 8.7*  MG  --  2.1   Liver Function Tests: No results for input(s): AST, ALT, ALKPHOS, BILITOT, PROT, ALBUMIN in the last 72 hours. No results for input(s): LIPASE, AMYLASE in the last 72 hours. CBC: Recent Labs    10/27/21 0542 10/28/21 0634  WBC 14.4* 15.5*  HGB 9.8* 10.0*  HCT 31.6* 32.7*  MCV 79.2* 79.0*  PLT 220 241   Cardiac Enzymes: No results for input(s): CKTOTAL, CKMB, CKMBINDEX, TROPONINI in the last 72 hours. BNP: Invalid input(s): POCBNP D-Dimer: No results for input(s): DDIMER in the last 72 hours. Hemoglobin A1C: No results for input(s): HGBA1C in the  last 72 hours. Fasting Lipid Panel: No results for input(s): CHOL, HDL, LDLCALC, TRIG, CHOLHDL, LDLDIRECT in the last 72 hours. Thyroid Function Tests: No results for input(s): TSH, T4TOTAL, T3FREE, THYROIDAB in the last 72 hours.  Invalid input(s): FREET3 Anemia Panel: No results for input(s): VITAMINB12, FOLATE, FERRITIN, TIBC, IRON, RETICCTPCT in the last 72 hours.  DG Chest Port 1 View  Result Date: 10/27/2021 CLINICAL DATA:  71 year old female with shortness of breath. EXAM: PORTABLE CHEST 1 VIEW COMPARISON:  Portable chest 10/26/2021 and earlier. FINDINGS: Portable AP semi upright view at 0725 hours. Stable lung volumes and mediastinal contours. Incidental calcified breast implants. Prior sternotomy and cardiac valve replacement. Visualized tracheal air column is within normal limits. No pneumothorax. Ongoing patchy and asymmetric bilateral pulmonary opacity, confluent in both the right upper and lower lungs now. Less veiling opacity at the left lung base although small left pleural effusion cannot be excluded. Pulmonary vascularity does not appear significantly changed over the recent series of exams. No right pleural effusion. No acute osseous abnormality identified. Right upper quadrant cholecystectomy clips. Negative visible bowel gas. IMPRESSION: Continued progression of right greater than left Patchy and confluent pulmonary opacity. This more resembles multifocal pneumonia than asymmetric pulmonary edema. Questionable small left pleural effusion. Electronically Signed   By:  Odessa Fleming M.D.   On: 10/27/2021 07:45   ECHOCARDIOGRAM COMPLETE  Result Date: 10/27/2021    ECHOCARDIOGRAM REPORT   Patient Name:   Katie Buckley Date of Exam: 10/27/2021 Medical Rec #:  [id number]         Height:       60.0 in Accession #:    [id number]        Weight:       143.0 lb Date of Birth:  Oct 12, 1950          BSA:          1.619 m Patient Age:    71 years          BP:           165/63 mmHg Patient Gender: F                  HR:           82 bpm. Exam Location:  [facility] Procedure: 2D Echo, Color Doppler and Cardiac Doppler Indications:     I50.31 congestive heart failure-Acute Diastolic  History:         Patient has prior history of Echocardiogram examinations, most                  recent 09/19/2021. Prior CABG, Aortic Valve Disease; Risk                  Factors:HCL and Diabetes.  Sonographer:     Humphrey Rolls Referring Phys:  [id number] Tresa Endo A GRIFFITH Diagnosing Phys: Alwyn Pea MD  Sonographer Comments: Image acquisition challenging due to breast implants. IMPRESSIONS  1. Left ventricular ejection fraction, by estimation, is 55 to 60%. The left ventricle has normal function. The left ventricle demonstrates regional wall motion abnormalities (see scoring diagram/findings for description). Left ventricular diastolic parameters are consistent with Grade II diastolic dysfunction (pseudonormalization).  2. Right ventricular systolic function is normal. The right ventricular size is normal.  3. The mitral valve is normal in structure. Trivial mitral valve regurgitation.  4. The aortic valve is normal in structure. Aortic valve regurgitation is not visualized. FINDINGS  Left Ventricle: Left ventricular ejection fraction, by estimation, is 55 to 60%. The left ventricle has normal function. The left ventricle demonstrates regional wall motion abnormalities. The left ventricular internal cavity size was normal in size. There is no left ventricular hypertrophy. Left ventricular diastolic parameters are consistent with Grade II diastolic dysfunction (pseudonormalization). Right Ventricle: The right ventricular size is normal. No increase in right ventricular wall thickness. Right ventricular systolic function is normal. Left Atrium: Left atrial size was normal in size. Right Atrium: Right atrial size was normal in size. Pericardium: There is no evidence of pericardial effusion. Mitral Valve: The mitral valve is normal in structure.  Trivial mitral valve regurgitation. MV peak gradient, 13.2 mmHg. The mean mitral valve gradient is 6.0 mmHg. Tricuspid Valve: The tricuspid valve is normal in structure. Tricuspid valve regurgitation is trivial. Aortic Valve: The aortic valve is normal in structure. Aortic valve regurgitation is not visualized. Aortic valve mean gradient measures 14.0 mmHg. Aortic valve peak gradient measures 24.6 mmHg. Aortic valve area, by VTI measures 1.04 cm. Pulmonic Valve: The pulmonic valve was normal in structure. Pulmonic valve regurgitation is not visualized. Aorta: The ascending aorta was not well visualized. IAS/Shunts: No atrial level shunt detected by color flow Doppler.  LEFT VENTRICLE PLAX 2D LVIDd:         3.87 cm  Diastology LVIDs:         2.78 cm   LV e' medial:    3.26 cm/s LV PW:         1.13 cm   LV E/e' medial:  59.5 LV IVS:        0.90 cm   LV e' lateral:   6.74 cm/s LVOT diam:     1.40 cm   LV E/e' lateral: 28.8 LV SV:         45 LV SV Index:   28 LVOT Area:     1.54 cm  LEFT ATRIUM             Index LA diam:        3.70 cm 2.29 cm/m LA Vol (A2C):   40.7 ml 25.15 ml/m LA Vol (A4C):   57.4 ml 35.46 ml/m LA Biplane Vol: 49.9 ml 30.83 ml/m  AORTIC VALVE                     PULMONIC VALVE AV Area (Vmax):    0.85 cm      PV Vmax:       1.33 m/s AV Area (Vmean):   0.83 cm      PV Vmean:      88.800 cm/s AV Area (VTI):     1.04 cm      PV VTI:        0.234 m AV Vmax:           248.00 cm/s   PV Peak grad:  7.1 mmHg AV Vmean:          180.000 cm/s  PV Mean grad:  3.0 mmHg AV VTI:            0.433 m AV Peak Grad:      24.6 mmHg AV Mean Grad:      14.0 mmHg LVOT Vmax:         137.00 cm/s LVOT Vmean:        96.700 cm/s LVOT VTI:          0.293 m LVOT/AV VTI ratio: 0.68  AORTA Ao Root diam: 2.00 cm MITRAL VALVE MV Area (PHT): 3.03 cm     SHUNTS MV Area VTI:   1.02 cm     Systemic VTI:  0.29 m MV Peak grad:  13.2 mmHg    Systemic Diam: 1.40 cm MV Mean grad:  6.0 mmHg MV Vmax:       1.82 m/s MV Vmean:       114.0 cm/s MV Decel Time: 250 msec MV E velocity: 194.00 cm/s MV A velocity: 147.00 cm/s MV E/A ratio:  1.32 Jumar Greenstreet D Liliah Dorian MD Electronically signed by Alwyn Pea MD Signature Date/Time: 10/27/2021/4:57:58 PM    Final      Echo preserved left ventricular function well-seated bioprosthetic valve functioning normally  TELEMETRY: Normal sinus rhythm 95:  ASSESSMENT AND PLAN:  Principal Problem:   Sepsis (HCC) Active Problems:   Normocytic anemia   Acute on chronic heart failure with preserved ejection fraction (HFpEF) (HCC)   Essential hypertension   Diabetes mellitus with neuropathy (HCC)   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   Pneumonia   Acute respiratory failure with hypoxia (HCC)   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   Paroxysmal atrial fibrillation, post CABG (HCC)   Hypokalemia   Rhinovirus infection    Plan Acute respiratory failure related to viral infection and pneumonia continue current therapy  including BiPAP Sepsis related to rhinovirus continue aggressive supportive care Possible mild congestive heart failure preserved left ventricular function continue current management with modest diuresis as necessary Agree with antibiotic therapy for pneumonia bronchitis respiratory failure Continue respiratory support BiPAP as necessary supplemental oxygen Hypoxemia should be corrected with oxygen therapy patient failed to transition to nasal cannula yesterday and is back on BiPAP feels reasonably well Continue cardiac therapy no intervention is recommended   Alwyn Pea, MD, 10/29/2021 7:05 AM

## 2021-10-29 NOTE — Progress Notes (Signed)
Progress Note   Patient: Katie Buckley YOV:[id number] DOB: [birth year] DOA: 10/25/2021     4 DOS: the patient was seen and examined on 10/29/2021   Brief hospital course: 71 y.o. female with -DM with neuropathy, HTN, diastolic CHF, severe aortic stenosis s/p TAVR on 09/19/2021 and s/p single-vessel CABG on 09/19/2021 with paroxysmal A-fib postop, currently on amiodarone  presents w/ 2-week history of shortness of breath, cough congestion and sore throat, now associated with fever up to 102, was prescribed amoxicillin by her cardiothoracic surgeon a few days prior but without improvement and her PCP ordered Omnicef a couple days ago but patient has not been taking it as prescribed PCP a few days prior on arrival of EMS O2 sat was 86% improving to the 90s on 4 L. In ED- TEMP-102.6 with pulse 71, respirations 34, BP 154/85 and O2 sat 89% on room air improving to 96% on 2 L. WBC of 8200 and lactic acid 0.8.  Hemoglobin 10.3.  CMP unremarkable, BNP 552.  COVID and flu negative.  CXR-bibasilar airspace opacities and small left effusion slightly increased from prior exam earlier on the same day.Patient given an IV fluid bolus started on Rocephin and azithromycin Hospitalist consulted for admission.  Pulmonology and cardiology consulted.  5/18 - pt with worsening hypoxia, spO2 of 84% on 6 L/min O2.  Placed on Bipap and transferred to Progressive unit.  Assessment and Plan: * Sepsis ([place]) Sepsis criteria includes fever, tachypnea, hypoxia and source of infection Continue sepsis fluids and treat pneumonia as outlined below  Acute respiratory failure with hypoxia ([place]) Patient was hypoxic to 86% with EMS requiring 4 L.  Tachypneic to 34 on arrival Supplemental O2 to keep sats over 94% and wean as tolerated  5/19: On BiPAP early this morning, weaned to 8 L/min HFNC oxygen  Multifocal pneumonia Patient positive for rhinovirus, covering with antibiotics for most likely secondary bacterial infection.  MRSA  screen negative --Pulmonology consulted --Continue Rocephin and azithromycin --Repeat chest x-ray today, VBG --Antitussives, DuoNebs as needed --Scheduled Mucinex --Incentive spirometer, flutter valve --Follow blood cultures --Supplemental oxygen as needed  Rhinovirus infection Respiratory viral panel positive for rhinovirus.  Covering forpossible secondary bacterial pneumonia as outlined. Supportive care per orders. Droplet precautions.  Hypokalemia Potassium normalized with replacement.  Monitor BMP replace K as needed  5/20: K2.8, likely due to diuretics.  Replacing.  Paroxysmal atrial fibrillation, post CABG (HCC) Continue amiodarone and ASA 325.  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  Will hold metformin and glipizide.  Continue Jardiance  Essential hypertension Blood pressure under fair control.  Continue amlodipine and irbesartan and metoprolol  Acute on chronic heart failure with preserved ejection fraction (HFpEF) ([place]) Patient was not overtly decompensated on admission, however BNP elevated to over 500.  She was treated with IV fluids per sepsis protocol, subsequently developed worsening hypoxic respiratory failure requiring BiPAP --Cardiology following --Continue IV Lasix 40 mg twice daily --Strict I/O's and daily weights --Monitor renal function and electrolytes --Continue Avapro, metoprolol, Jardiance -- Low-sodium diet fluid restriction  Echo 5/18 with preserved EF, grade 2 diastolic dysfunction.  Normocytic anemia Hemoglobin stable.  Monitor CBC        Subjective: Pt awake  sitting up in bed when seen this morning.  She remains  on 13 L/min HF [place] oxygen.  She had a temperature of 1 of 1.2 at 8 PM last night.  She denies feeling hot but has been cold most of the time since admission.  She feels a little bit short of breath.  Is having some cough, use of flutter valve helps to bring phlegm up.  She otherwise reports very decreased appetite requesting another Ensure which she is tolerating and enjoys.  She otherwise says feeling very thirsty from the BiPAP and high flow oxygen.  No other acute complaints or acute events reported at this time  Physical Exam: Vitals:   10/29/21 1147 10/29/21 1200 10/29/21 1538 10/29/21 1600  BP: (!) 148/69 (!) 163/67 (!) 152/85 (!) 181/67  Pulse: 94 96 90 89  Resp: (!) '21 20 20 '$ (!) 31  Temp: 98 F (36.7 C)  97.7 F (36.5 C)   TempSrc: Oral  Oral   SpO2: 94% 93% 90% 90%  Weight:      Height:       General exam: awake but drowsy, no acute distress HEENT: Dry mucous membranes, hearing grossly normal Respiratory system: Bibasilar crackles, on 13 L/min HFNC O2. Cardiovascular system: RRR, no peripheral edema Central nervous system: Alert and oriented x3, grossly nonfocal exam, normal speech Extremities: Moves all, no edema or cyanosis, normal tone Skin: Dry intact normal temperature Psychiatry: normal mood, congruent affect, judgement and insight appear normal   Data Reviewed:  Notable labs: VBG normal except PCO2 slightly low at 39.  BMP with potassium 2.8, chloride 96, glucose 138, anion gap 18 likely due to diuretics, calcium 8.8.  Procalcitonin 1.19.  CBC with WBC 16.5, hemoglobin 10.7  Chest x-ray this morning --- IMPRESSION: Extensive infiltrates are seen in both lungs, more so on the right side. There is decrease in infiltrate in the right parahilar region and possible worsening of infiltrate in the lateral right lower lung fields. Findings suggest multifocal pneumonia. Possibility of underlying asymmetric pulmonary edema is not excluded.    Family Communication: none,  will attempt to call   Disposition: Status is: Inpatient Remains inpatient appropriate because: severity of illness, remains with significant oxygen requirements, on IV therapies    Planned Discharge Destination: Home    Time spent: 35 minutes  Author: Ezekiel Slocumb, DO 10/29/2021 4:46 PM  For on call review [email or web address].

## 2021-10-29 NOTE — Progress Notes (Addendum)
1000 patient asking for juice and drinking a lot of apple juice with meals spoke with patient regarding lasix fluid and 02 needs 1400 patient having family bring in pop and asking staff for excess drinks 1600 patient continues to drink apple juice and asking for pop daughter at bedside with extra large pop from cafeteria education given on Cxr worse unable to decrease 02 at all. Dr notified Fluid restriction added to diet

## 2021-10-30 DIAGNOSIS — E876 Hypokalemia: Secondary | ICD-10-CM | POA: Diagnosis not present

## 2021-10-30 DIAGNOSIS — B348 Other viral infections of unspecified site: Secondary | ICD-10-CM | POA: Diagnosis not present

## 2021-10-30 DIAGNOSIS — J9601 Acute respiratory failure with hypoxia: Secondary | ICD-10-CM | POA: Diagnosis not present

## 2021-10-30 DIAGNOSIS — J189 Pneumonia, unspecified organism: Secondary | ICD-10-CM | POA: Diagnosis not present

## 2021-10-30 LAB — CBC
HCT: 34.9 % — ABNORMAL LOW (ref 36.0–46.0)
Hemoglobin: 10.6 g/dL — ABNORMAL LOW (ref 12.0–15.0)
MCH: 24.1 pg — ABNORMAL LOW (ref 26.0–34.0)
MCHC: 30.4 g/dL (ref 30.0–36.0)
MCV: 79.3 fL — ABNORMAL LOW (ref 80.0–100.0)
Platelets: 329 10*3/uL (ref 150–400)
RBC: 4.4 MIL/uL (ref 3.87–5.11)
RDW: 16.8 % — ABNORMAL HIGH (ref 11.5–15.5)
WBC: 15.2 10*3/uL — ABNORMAL HIGH (ref 4.0–10.5)
nRBC: 0 % (ref 0.0–0.2)

## 2021-10-30 LAB — GLUCOSE, CAPILLARY
Glucose-Capillary: 139 mg/dL — ABNORMAL HIGH (ref 70–99)
Glucose-Capillary: 212 mg/dL — ABNORMAL HIGH (ref 70–99)
Glucose-Capillary: 236 mg/dL — ABNORMAL HIGH (ref 70–99)
Glucose-Capillary: 110 mg/dL — ABNORMAL HIGH (ref 70–99)

## 2021-10-30 LAB — BASIC METABOLIC PANEL
Anion gap: 13 (ref 5–15)
BUN: 29 mg/dL — ABNORMAL HIGH (ref 8–23)
CO2: 28 mmol/L (ref 22–32)
Calcium: 8.9 mg/dL (ref 8.9–10.3)
Chloride: 97 mmol/L — ABNORMAL LOW (ref 98–111)
Creatinine, Ser: 0.71 mg/dL (ref 0.44–1.00)
GFR, Estimated: >60 mL/min (ref >60)
Glucose, Bld: 210 mg/dL — ABNORMAL HIGH (ref 70–99)
Potassium: 3.4 mmol/L — ABNORMAL LOW (ref 3.5–5.1)
Sodium: 138 mmol/L (ref 135–145)

## 2021-10-30 LAB — CULTURE, BLOOD (ROUTINE X 2): Culture: NO GROWTH

## 2021-10-30 LAB — MAGNESIUM: Magnesium: 2.4 mg/dL (ref 1.7–2.4)

## 2021-10-30 MED ORDER — IPRATROPIUM-ALBUTEROL 0.5-2.5 (3) MG/3ML IN SOLN
3.0000 mL | Freq: Four times a day (QID) | RESPIRATORY_TRACT | Status: DC
Start: 2021-10-30 — End: 2021-11-02
  Administered 2021-10-30 – 2021-11-02 (×10): 3 mL via RESPIRATORY_TRACT
  Filled 2021-10-30 (×10): qty 3

## 2021-10-30 MED ORDER — FUROSEMIDE 40 MG PO TABS
40.0000 mg | ORAL_TABLET | Freq: Every day | ORAL | Status: DC
  Administered 2021-10-31 – 2021-11-02 (×3): 40 mg via ORAL
  Filled 2021-10-30 (×4): qty 1

## 2021-10-30 MED ORDER — POTASSIUM CHLORIDE CRYS ER 20 MEQ PO TBCR
40.0000 meq | EXTENDED_RELEASE_TABLET | ORAL | Status: AC
  Administered 2021-10-30 (×2): 40 meq via ORAL
  Filled 2021-10-30 (×2): qty 2

## 2021-10-30 NOTE — Progress Notes (Signed)
Bipap declined by pt. States she has no sob

## 2021-10-30 NOTE — Evaluation (Signed)
Physical Therapy Evaluation Patient Details Name: Katie Buckley MRN: [id number] DOB: 05/29/1951 Today's Date: 10/30/2021  History of Present Illness  Pt is a 71 yo  female that presented to ED for two week history of SOB, cough, congestion, sore throat, fever. Worsening hypoxia on 5/18 pt was placed on bipap and transferred to progressive unit. Workup showed sepsis, multifocal PNA, rhinovirus.  PMH of aortic valve replacement with tissue valve 09/19/21, as well as CABG 09/19/2021, DM, HTN, afib, CHF.   Clinical Impression  [name], denied pain. Reported at baseline she is independent and lives on a first floor apartment with her husband who is available to assist as needed (and performs most cooking/cleaning).  The patient was able to perform  bed mobility modI, with time and use of bed rails. Good sitting balance noted. Pt able to stand for majority of session, especially at sink to wash her face and brush her teeth, supervision. spO2 90% or greater throughout mobility, HR 70s-80s. Pt recalled that shes "not supposed to lift much" in regards to sternal precautions. She was able to ambulate ~29f with no AD, supervision-CGA, pt reached for external support and endorsed that she does that at home, declined RW. Pt also declined further mobility stating that was enough for now. At this time PT to follow acutely during hospital stay, but recommendation at discharge is to continue with cardiac rehab.        Recommendations for follow up therapy are one component of a multi-disciplinary discharge planning process, led by the attending physician.  Recommendations may be updated based on patient status, additional functional criteria and insurance authorization.  Follow Up Recommendations Other (comment) (recommend continuation of cardiac rehab at discharge)    Assistance Recommended at Discharge Intermittent Supervision/Assistance  Patient can return home with the following  A little help with walking  and/or transfers;A little help with bathing/dressing/bathroom;Assistance with cooking/housework;Assist for transportation;Help with stairs or ramp for entrance    Equipment Recommendations None recommended by PT  Recommendations for Other Services       Functional Status Assessment Patient has had a recent decline in their functional status and demonstrates the ability to make significant improvements in function in a reasonable and predictable amount of time.     Precautions / Restrictions Precautions Precautions: Fall;Sternal Precaution Comments: pt only recalls to "not lift anything" Restrictions Weight Bearing Restrictions: No      Mobility  Bed Mobility Overal bed mobility: Modified Independent                  Transfers Overall transfer level: Needs assistance   Transfers: Sit to/from Stand Sit to Stand: Supervision           General transfer comment: pt reaches for external support    Ambulation/Gait Ambulation/Gait assistance: Min guard, Supervision Gait Distance (Feet): 20 Feet Assistive device: None   Gait velocity: decreased     General Gait Details: pt reaches for external support but does not want RW  Stairs            Wheelchair Mobility    Modified Rankin (Stroke Patients Only)       Balance Overall balance assessment: Needs assistance Sitting-balance support: Feet supported Sitting balance-Leahy Scale: Good     Standing balance support: Single extremity supported Standing balance-Leahy Scale: Fair Standing balance comment: stood at sink for several minutes to brush her teeth and wash her face, supervision  Pertinent Vitals/Pain Pain Assessment Pain Assessment: No/denies pain    Home Living Family/patient expects to be discharged to:: Private residence Living Arrangements: Spouse/significant other Available Help at Discharge: Available 24 hours/day Type of Home: Apartment Home  Access: Stairs to enter Entrance Stairs-Rails: Psychiatric nurse of Steps: 2   Home Layout: One level Home Equipment: Cane - single Barista (2 wheels);Grab bars - tub/shower      Prior Function Prior Level of Function : Independent/Modified Independent                     Hand Dominance        Extremity/Trunk Assessment   Upper Extremity Assessment Upper Extremity Assessment: Generalized weakness (some tremors, and facial twitching noted)    Lower Extremity Assessment Lower Extremity Assessment: Generalized weakness    Cervical / Trunk Assessment Cervical / Trunk Assessment: Normal  Communication      Cognition Arousal/Alertness: Awake/alert Behavior During Therapy: WFL for tasks assessed/performed Overall Cognitive Status: Within Functional Limits for tasks assessed                                 General Comments: some occasional extra time needed for processing        General Comments      Exercises     Assessment/Plan    PT Assessment Patient needs continued PT services  PT Problem List Decreased mobility;Decreased strength;Decreased activity tolerance;Decreased balance       PT Treatment Interventions Therapeutic exercise;Gait training;Stair training;Functional mobility training;Therapeutic activities;Patient/family education;Neuromuscular re-education;Balance training;DME instruction    PT Goals (Current goals can be found in the Care Plan section)  Acute Rehab PT Goals Patient Stated Goal: to go home PT Goal Formulation: With patient Time For Goal Achievement: 11/13/21 Potential to Achieve Goals: Good    Frequency Min 2X/week     Co-evaluation               AM-PAC PT "6 Clicks" Mobility  Outcome Measure Help needed turning from your back to your side while in a flat bed without using bedrails?: None Help needed moving from lying on your back to sitting on the side of a flat bed without  using bedrails?: None Help needed moving to and from a bed to a chair (including a wheelchair)?: None Help needed standing up from a chair using your arms (e.g., wheelchair or bedside chair)?: None Help needed to walk in hospital room?: A Little Help needed climbing 3-5 steps with a railing? : A Little 6 Click Score: 22    End of Session Equipment Utilized During Treatment: Gait belt;Oxygen (4L) Activity Tolerance: Patient tolerated treatment well Patient left: in chair;with call bell/phone within reach Nurse Communication: Mobility status PT Visit Diagnosis: Other abnormalities of gait and mobility (R26.89)    Time: 5852-7782 PT Time Calculation (min) (ACUTE ONLY): 35 min   Charges:   PT Evaluation $PT Eval Low Complexity: 1 Low PT Treatments $Therapeutic Activity: 23-37 mins        Lieutenant Diego PT, DPT 3:38 PM,10/30/21

## 2021-10-30 NOTE — Progress Notes (Signed)
Progress Note   Patient: Katie Buckley TDH:[id number] DOB: February 05, 1951 DOA: 10/25/2021     5 DOS: the patient was seen and examined on 10/30/2021   Brief hospital course: 71 y.o. female with -DM with neuropathy, HTN, diastolic CHF, severe aortic stenosis s/p TAVR on 09/19/2021 and s/p single-vessel CABG on 09/19/2021 with paroxysmal A-fib postop, currently on amiodarone  presents w/ 2-week history of shortness of breath, cough congestion and sore throat, now associated with fever up to 102, was prescribed amoxicillin by her cardiothoracic surgeon a few days prior but without improvement and her PCP ordered Omnicef a couple days ago but patient has not been taking it as prescribed PCP a few days prior on arrival of EMS O2 sat was 86% improving to the 90s on 4 L. In ED- TEMP-102.6 with pulse 71, respirations 34, BP 154/85 and O2 sat 89% on room air improving to 96% on 2 L. WBC of 8200 and lactic acid 0.8.  Hemoglobin 10.3.  CMP unremarkable, BNP 552.  COVID and flu negative.  CXR-bibasilar airspace opacities and small left effusion slightly increased from prior exam earlier on the same day.Patient given an IV fluid bolus started on Rocephin and azithromycin Hospitalist consulted for admission.  Pulmonology and cardiology consulted.  5/18 - pt with worsening hypoxia, spO2 of 84% on 6 L/min O2.  Placed on Bipap and transferred to Progressive unit.  Assessment and Plan: * Sepsis ([place]) Sepsis criteria includes fever, tachypnea, hypoxia and source of infection Continue sepsis fluids and treat pneumonia as outlined below  Acute respiratory failure with hypoxia ([place]) Patient was hypoxic to 86% with EMS requiring 4 L.  Tachypneic to 34 on arrival Supplemental O2 to keep sats over 94% and wean as tolerated  5/19: On BiPAP early this morning, weaned to 8 L/min HFNC oxygen 5/20: On BiPAP overnight, on 13 L/min HFNC this morning on rounds 5/21: Weaned to 7 L/min HFNC, declined BiPAP overnight noted  improvement in shortness of breath.  Multifocal pneumonia Patient positive for rhinovirus, covering with antibiotics for most likely secondary bacterial infection.  MRSA screen negative Repeat chest x-ray 5/20 showed extensive infiltrates in both lungs more so on the right, decreased infiltrate in the right perihilar region with possible worsening of the infiltrate in the lateral right lower lung field consistent with multifocal pneumonia versus asymmetric edema (after several days diuresis edema much less likely) --Pulmonology consulted --Continue Rocephin and azithromycin --Antitussives, DuoNebs as needed --Scheduled Mucinex --Incentive spirometer, flutter valve --Follow blood cultures --Supplemental oxygen as needed  Rhinovirus infection Respiratory viral panel positive for rhinovirus.  Covering forpossible secondary bacterial pneumonia as outlined. Supportive care per orders. Droplet precautions.  Hypokalemia Potassium normalized with replacement.  Monitor BMP replace K as needed  5/20: K2.8, likely due to diuretics.  Replacing. 5/21: K3.4, further replacing  Paroxysmal atrial fibrillation, post CABG (HCC) Continue amiodarone and ASA 325.  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  Will hold metformin and glipizide.  Continue Jardiance  Essential hypertension Blood pressure under fair control.  Continue amlodipine and irbesartan and metoprolol  Acute on chronic heart failure with preserved ejection fraction (HFpEF) ([place]) Patient was not overtly decompensated on admission, however BNP elevated to  over 500.  She was treated with IV fluids per sepsis protocol,  subsequently developed worsening hypoxic respiratory failure requiring BiPAP --Cardiology following -- Treated with IV Lasix 40 mg twice daily (stopped 5/21) --Resume oral Lasix 40 mg daily (5/21) --Strict I/O's and daily weights --Monitor renal function and electrolytes --Continue Avapro, metoprolol, Jardiance -- Low-sodium diet fluid restriction  Echo 5/18 with preserved EF, grade 2 diastolic dysfunction.  Normocytic anemia Hemoglobin stable.  Monitor CBC        Subjective: Pt was sleeping very soundly when seen on rounds this morning, difficult to arouse despite a lot of verbal and physical stimulus.  She did wake briefly denied any complaints.  No acute events reported.  Oxygen needs a lot better, only on 7 this morning down from 13 yesterday and needing BiPAP every day prior to this.  She declined BiPAP due to not having shortness of breath last night.  Clinically making progress.  Physical Exam: Vitals:   10/30/21 1121 10/30/21 1129 10/30/21 1620 10/30/21 1627  BP:  135/60    Pulse: 69 72  80  Resp: 20 20  (!) 22  Temp:  98.9 F (37.2 C)    TempSrc:  Oral    SpO2: 92% 93% 96% 92%  Weight:      Height:       General exam: Sleeping very soundly, difficult to arouse awakens briefly, no acute distress HEENT: Eyes remain closed only opened briefly, hearing grossly normal Respiratory system: Lung sounds clear with diminished bases, on 7 L/min HFNC O2. Cardiovascular system: RRR, no peripheral edema Central nervous system: Exam limited by somnolence but grossly nonfocal Extremities: Moves all, no edema or cyanosis, normal tone Skin: Dry intact normal temperature Psychiatry: Unable to evaluate due to somnolence   Data Reviewed:  Notable labs: Potassium 3.4, chloride 97, glucose 210, BUN 29, WBC 15.2, hemoglobin 10.2   Family Communication: none, will attempt to call   Disposition: Status is: Inpatient Remains inpatient appropriate because: severity of illness,  remains with significant oxygen requirements, on IV therapies    Planned Discharge Destination: Home    Time spent: 35 minutes  Author: Ezekiel Slocumb, DO 10/30/2021 5:01 PM  For on call review [email or web address].

## 2021-10-30 NOTE — Progress Notes (Signed)
[facility]    SUBJECTIVE: Patient much improved improved shortness of breath dyspnea less hypoxic more energy.  Patient in general feels much better feels like she is getting better slowly   Vitals:   10/30/21 0500 10/30/21 0732 10/30/21 0815 10/30/21 0816  BP:   (!) 140/58   Pulse:  80 89   Resp:  (!) 23 (!) 26 (!) 21  Temp:   99 F (37.2 C)   TempSrc:   Oral   SpO2:  96% 94%   Weight: 63.2 kg     Height:         Intake/Output Summary (Last 24 hours) at 10/30/2021 0843 Last data filed at 10/29/2021 1900 Gross per 24 hour  Intake 2380 ml  Output 1750 ml  Net 630 ml      PHYSICAL EXAM  General: Well developed, well nourished, in no acute distress HEENT:  Normocephalic and atramatic Neck:  No JVD.  Lungs: Clear bilaterally to auscultation and percussion. Heart: HRRR . Normal S1 and S2 without gallops or murmurs.  Abdomen: Bowel sounds are positive, abdomen soft and non-tender  Msk:  Back normal, normal gait. Normal strength and tone for age. Extremities: No clubbing, cyanosis or edema.   Neuro: Alert and oriented X 3. Psych:  Good affect, responds appropriately   LABS: Basic Metabolic Panel: Recent Labs    10/28/21 0634 10/29/21 0648  NA 138 137  K 3.5 2.8*  CL 100 96*  CO2 22 23  GLUCOSE 147* 138*  BUN 25* 22  CREATININE 0.70 0.66  CALCIUM 8.7* 8.8*  MG 2.1  --    Liver Function Tests: No results for input(s): AST, ALT, ALKPHOS, BILITOT, PROT, ALBUMIN in the last 72 hours. No results for input(s): LIPASE, AMYLASE in the last 72 hours. CBC: Recent Labs    10/28/21 0634 10/29/21 0648  WBC 15.5* 16.5*  HGB 10.0* 10.7*  HCT 32.7* 35.6*  MCV 79.0* 79.3*  PLT 241 283   Cardiac Enzymes: No results for input(s): CKTOTAL, CKMB, CKMBINDEX, TROPONINI in the last 72 hours. BNP: Invalid input(s): POCBNP D-Dimer: No results for input(s): DDIMER in the last 72 hours. Hemoglobin A1C: No results for input(s): HGBA1C in the last 72 hours. Fasting Lipid  Panel: No results for input(s): CHOL, HDL, LDLCALC, TRIG, CHOLHDL, LDLDIRECT in the last 72 hours. Thyroid Function Tests: No results for input(s): TSH, T4TOTAL, T3FREE, THYROIDAB in the last 72 hours.  Invalid input(s): FREET3 Anemia Panel: No results for input(s): VITAMINB12, FOLATE, FERRITIN, TIBC, IRON, RETICCTPCT in the last 72 hours.  DG Chest Port 1 View  Result Date: 10/29/2021 CLINICAL DATA:  Fever, shortness of breath EXAM: PORTABLE CHEST 1 VIEW COMPARISON:  Previous studies including the examination of 10/27/2021 FINDINGS: Transverse diameter of heart is increased. There is previous placement of prosthetic cardiac valve. There are patchy alveolar densities in both lungs, more so on the right side. There is slight improvement in infiltrates in right parahilar region and worsening of infiltrates in the lateral right lower lung fields. Lateral CP angles are clear. There is no pneumothorax. Faint calcifications are seen in the chest wall, possibly breast implants. IMPRESSION: Extensive infiltrates are seen in both lungs, more so on the right side. There is decrease in infiltrate in the right parahilar region and possible worsening of infiltrate in the lateral right lower lung fields. Findings suggest multifocal pneumonia. Possibility of underlying asymmetric pulmonary edema is not excluded. Electronically Signed   By: Elmer Picker M.D.   On: 10/29/2021  10:53     Echo preserved overall left ventricular function well-seated bioprosthetic aortic valve  TELEMETRY: Normal sinus rhythm rate of 80 nonspecific findings:  ASSESSMENT AND PLAN:  Principal Problem:   Sepsis ([place]) Active Problems:   Normocytic anemia   Acute on chronic heart failure with preserved ejection fraction (HFpEF) (HCC)   Essential hypertension   Diabetes mellitus with neuropathy (HCC)   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   Multifocal pneumonia   Acute respiratory failure with  hypoxia (HCC)   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   Paroxysmal atrial fibrillation, post CABG (HCC)   Hypokalemia   Rhinovirus infection    Plan Respiratory status somewhat improved on nasal cannula 7 L on BiPAP as needed patient states she feels much better less short of breath Continue antibiotic therapy respiratory support inhalers Patient have viral pneumonia and respiratory infection gradually improving and less hypoxic No clear evidence of congestive heart failure continue low-dose diuretic therapy as necessary Paroxysmal atrial fibrillation continue anticoagulation therapy as needed continue Eliquis and amiodarone Agree with diabetes management and control Continue Crestor therapy for hyperlipidemia Recommend increasing activity up out of bed to chair consider physical therapy as well No invasive cardiac studies recommended   Yolonda Kida, MD 10/30/2021 8:43 AM

## 2021-10-30 NOTE — Progress Notes (Signed)
Patient care taken over by this nurse. No complaints at this time.

## 2021-10-31 ENCOUNTER — Encounter: Payer: Self-pay | Admitting: Internal Medicine

## 2021-10-31 ENCOUNTER — Inpatient Hospital Stay: Payer: PPO

## 2021-10-31 DIAGNOSIS — R0600 Dyspnea, unspecified: Secondary | ICD-10-CM | POA: Diagnosis not present

## 2021-10-31 DIAGNOSIS — J9601 Acute respiratory failure with hypoxia: Secondary | ICD-10-CM | POA: Diagnosis not present

## 2021-10-31 DIAGNOSIS — J189 Pneumonia, unspecified organism: Secondary | ICD-10-CM | POA: Diagnosis not present

## 2021-10-31 DIAGNOSIS — I5033 Acute on chronic diastolic (congestive) heart failure: Secondary | ICD-10-CM | POA: Diagnosis not present

## 2021-10-31 LAB — BASIC METABOLIC PANEL
Anion gap: 9 (ref 5–15)
BUN: 30 mg/dL — ABNORMAL HIGH (ref 8–23)
CO2: 27 mmol/L (ref 22–32)
Calcium: 8.8 mg/dL — ABNORMAL LOW (ref 8.9–10.3)
Chloride: 100 mmol/L (ref 98–111)
Creatinine, Ser: 0.56 mg/dL (ref 0.44–1.00)
GFR, Estimated: >60 mL/min (ref >60)
Glucose, Bld: 141 mg/dL — ABNORMAL HIGH (ref 70–99)
Potassium: 5.2 mmol/L — ABNORMAL HIGH (ref 3.5–5.1)
Sodium: 136 mmol/L (ref 135–145)

## 2021-10-31 LAB — GLUCOSE, CAPILLARY
Glucose-Capillary: 166 mg/dL — ABNORMAL HIGH (ref 70–99)
Glucose-Capillary: 165 mg/dL — ABNORMAL HIGH (ref 70–99)
Glucose-Capillary: 214 mg/dL — ABNORMAL HIGH (ref 70–99)
Glucose-Capillary: 273 mg/dL — ABNORMAL HIGH (ref 70–99)

## 2021-10-31 LAB — CULTURE, BLOOD (ROUTINE X 2): Culture: NO GROWTH

## 2021-10-31 LAB — CBC
HCT: 32 % — ABNORMAL LOW (ref 36.0–46.0)
Hemoglobin: 9.8 g/dL — ABNORMAL LOW (ref 12.0–15.0)
MCH: 24.1 pg — ABNORMAL LOW (ref 26.0–34.0)
MCHC: 30.6 g/dL (ref 30.0–36.0)
MCV: 78.6 fL — ABNORMAL LOW (ref 80.0–100.0)
Platelets: 318 10*3/uL (ref 150–400)
RBC: 4.07 MIL/uL (ref 3.87–5.11)
RDW: 17.1 % — ABNORMAL HIGH (ref 11.5–15.5)
WBC: 14.6 10*3/uL — ABNORMAL HIGH (ref 4.0–10.5)
nRBC: 0 % (ref 0.0–0.2)

## 2021-10-31 LAB — BRAIN NATRIURETIC PEPTIDE: B Natriuretic Peptide: 565.8 pg/mL — ABNORMAL HIGH (ref 0.0–100.0)

## 2021-10-31 LAB — D-DIMER, QUANTITATIVE: D-Dimer, Quant: 3.59 ug/mL-FEU — ABNORMAL HIGH (ref 0.00–0.50)

## 2021-10-31 LAB — MAGNESIUM: Magnesium: 2.3 mg/dL (ref 1.7–2.4)

## 2021-10-31 MED ORDER — METHYLPREDNISOLONE SODIUM SUCC 40 MG IJ SOLR
40.0000 mg | Freq: Every day | INTRAMUSCULAR | Status: DC
  Administered 2021-10-31 – 2021-11-01 (×2): 40 mg via INTRAVENOUS
  Filled 2021-10-31 (×2): qty 1

## 2021-10-31 MED ORDER — INSULIN ASPART 100 UNIT/ML IJ SOLN
3.0000 [IU] | Freq: Three times a day (TID) | INTRAMUSCULAR | Status: DC
  Administered 2021-10-31 – 2021-11-01 (×3): 3 [IU] via SUBCUTANEOUS
  Filled 2021-10-31 (×3): qty 1

## 2021-10-31 MED ORDER — FUROSEMIDE 10 MG/ML IJ SOLN
40.0000 mg | Freq: Once | INTRAMUSCULAR | Status: AC
  Administered 2021-10-31: 40 mg via INTRAVENOUS
  Filled 2021-10-31: qty 4

## 2021-10-31 MED ORDER — AMIODARONE HCL 200 MG PO TABS
200.0000 mg | ORAL_TABLET | Freq: Every day | ORAL | Status: DC
Start: 2021-10-31 — End: 2021-11-02
  Administered 2021-10-31 – 2021-11-02 (×3): 200 mg via ORAL
  Filled 2021-10-31 (×3): qty 1

## 2021-10-31 MED ORDER — IOHEXOL 350 MG/ML SOLN
75.0000 mL | Freq: Once | INTRAVENOUS | Status: AC | PRN
  Administered 2021-10-31: 75 mL via INTRAVENOUS

## 2021-10-31 MED ORDER — CEFEPIME HCL 2 G IV SOLR
2.0000 g | Freq: Two times a day (BID) | INTRAVENOUS | Status: DC
  Administered 2021-10-31 – 2021-11-01 (×3): 2 g via INTRAVENOUS
  Filled 2021-10-31 (×4): qty 12.5

## 2021-10-31 NOTE — Consult Note (Signed)
Pharmacy Antibiotic Note  Katie Buckley is a 71 y.o. female w/ h/o DM, HTN, CHF, TAVR, CABG, &  afib admitted on 10/25/2021 with pneumonia.  Treated initially with ceftriaxone + azithromycin (5/16-5/21); fevered w/ worsening hypoxia & started Cefepime 5/22. Pharmacy has been consulted for Cefepime dosing.  5/22 CTA Chest - pending (w/u for PE & PNA)  Plan: Initiate Cefepime 2g IV q12h  Height: 5' (152.4 cm) Weight: 58.5 kg (128 lb 15.5 oz) IBW/kg (Calculated) : 45.5  Temp (24hrs), Avg:99.1 F (37.3 C), Min:98.6 F (37 C), Max:100.3 F (37.9 C)  Recent Labs  Lab 10/25/21 2038 10/25/21 2227 10/26/21 0327 10/27/21 0542 10/28/21 0634 10/29/21 0648 10/30/21 0946 10/31/21 0210  WBC 8.2  --    < > 14.4* 15.5* 16.5* 15.2* 14.6*  CREATININE 0.61  --    < > 0.60 0.70 0.66 0.71 0.56  LATICACIDVEN 0.9 0.8  --   --   --   --   --   --    < > = values in this interval not displayed.    Estimated Creatinine Clearance: 52.4 mL/min (by C-G formula based on SCr of 0.56 mg/dL).    Allergies  Allergen Reactions   Doxycycline Other (See Comments)    Emotional changes/anxious   Biaxin [Clarithromycin]     GI upset, and bad taste in mouth.    Antimicrobials this admission: CRO/Azith (5/16-5/21) CFP (5/22 >>   Dose adjustments this admission: CTM and adjust PRN. Scr 0.71>0.56 (stable at baseline)  Microbiology results: 5/17 BCx: NGTD 5/17 UCx: NGTD  5/17 RVP: Rhino/Enterovirus+  5/17 MRSA PCR: negative 5/16 Flu/Cov - negative  Thank you for allowing pharmacy to be a part of this patient's care.  Shanon Brow Amarah Brossman 10/31/2021 11:07 AM

## 2021-10-31 NOTE — Progress Notes (Signed)
At shift change evening 5/21, had been weaned to 4 L and maintaining SpO2 in the mid to high 80s near goal. Gradually downtrending SpO2 as low as 80% on the monitor on 4L, and increase in discomfort, mild anxiety, prompted consultation with respiratory who recommended increase in O2 flow rate. 9 L was applied with much relief initially.   Progressively through the shift, O2 needs increased yet again to 12 L to maintain SpO2 at 85-88%. At 15 L to maintain 88%, RT again consulted who reapplied BiPAP. Tolerating well, now saturating consistently 90-92% on BiPAP with increased comfort with respiratory effort.

## 2021-10-31 NOTE — Consult Note (Cosign Needed Addendum)
Name: Katie Buckley MRN: [id number] DOB: November 28, 1950    ADMISSION DATE:  10/25/2021 CONSULTATION DATE: 10/26/2021  REFERRING MD : Dr. Antonieta Pert  CHIEF COMPLAINT: Shortness of Breath   BRIEF PATIENT DESCRIPTION: 71 yo female admitted with acute hypoxic respiratory failure secondary to acute on chronic CHF exacerbation, atelectasis and suspected bronchitis vs. CAP  SIGNIFICANT EVENTS  5/16: Pt admitted to the medsurg unit 5/17: Pt developed worsening hypoxia PCCM team consulted  5/22: Pt with worsening hypoxia requiring 13L O2 via HFNC.  PCCM reconsulted.  CTA Chest ordered to r/o pulmonary embolism and cefepime started for pneumonia   STUDIES:  None   HISTORY OF PRESENT ILLNESS:   This is a 71 yo female with a PMH of Anxiety, Tardive Dyskinesia,  Aortic Stenosis s/p Bioprosthetic TAVR (09/2021), Chronic Diastolic CHF (EF 65 to 24% via Echo 10/12/2021), Sinus Infection, Headache, Barrett's Esophagus, Hyperchloremia, MRSA (2018), Palpitations, Skin Cancer, and Seasonal Allergies.  She presented to [facility] ER on 05/16 via EMS from home with a 2 week hx of shortness of breath, cough, and sore throat.  Per ER notes EMS reported pt febrile temp 102 F and hypoxic on RA O2 sats 86%.  She was placed on 4L O2 via nasal canula.  Pt previously seen by her PCP on 05/16 CXR ordered and pt prescribed empiric cefdinir, however due to worsening symptoms pt notified EMS.   ED Course  Upon arrival to the ER pt slightly confused with shortness of breath.  CXR revealed left-sided pleural effusion and mild left basilar atelectasis.  Lab results revealed BNP 552.6.  Flu A&B/Covid negative.  UA negative for UTI.  She received ceftriaxone, azithromycin, and gentle iv fluid resuscitation due to concern for possible pneumonia. She was subsequently admitted to the [facility] unit per hospitalist team for additional workup and treatment. On 05/17 pt developed worsening acute hypoxic respiratory failure PCCM consulted to  assist with management.   PAST MEDICAL HISTORY :   has a past medical history of Anxiety, Aortic stenosis (01/21/2013), Frequent sinus infections, Headache, History of Barrett's esophagus, Hyperchloremia, MRSA (methicillin resistant staph aureus) culture positive (2018), Palpitations, Seasonal allergies, and Skin cancer.  has a past surgical history that includes Nasal sinus surgery (03/2013); Release of Trigger Finger (Right, 10/2010); Carpal Tunnel Symdrome (2008); Cholecystectomy (2007); Bone Spur (2007); Tonsillectomy (1960); Mandible surgery; Colonoscopy with propofol (N/A, 11/17/2015); Esophagogastroduodenoscopy (egd) with propofol (N/A, 11/17/2015); Cataract extraction w/PHACO (Left, 01/16/2019); Cardiac catheterization; Augmentation mammaplasty (Bilateral); Foot surgery; Cataract extraction w/PHACO (Right, 02/06/2019); Eye surgery (Bilateral); Anterior cervical decomp/discectomy fusion (N/A, 11/15/2020); RIGHT/LEFT HEART CATH AND CORONARY ANGIOGRAPHY (N/A, 05/24/2021); INTRAVASCULAR PRESSURE WIRE/FFR STUDY (N/A, 05/24/2021); Coronary artery bypass graft (N/A, 09/19/2021); Aortic valve replacement (N/A, 09/19/2021); and TEE without cardioversion (N/A, 09/19/2021). Prior to Admission medications   Medication Sig Start Date End Date Taking? Authorizing Provider  acetaminophen (TYLENOL) 650 MG CR tablet Take 1,300 mg by mouth every 8 (eight) hours as needed for pain (pain).    [provider]  amiodarone (PACERONE) 200 MG tablet Take 1 tablet (200 mg total) by mouth 2 (two) times daily. For 7 days then take 200 mg daily thereafter 09/24/21   Nani Skillern, PA-C  amLODipine (NORVASC) 10 MG tablet Take 1 tablet (10 mg total) by mouth daily. 09/24/21   Nani Skillern, PA-C  aspirin EC 325 MG EC tablet Take 1 tablet (325 mg total) by mouth daily. 09/24/21   Nani Skillern, PA-C  buPROPion (WELLBUTRIN SR) 150 MG 12 hr tablet  Take 150 mg by mouth 2 (two) times daily. 05/25/20   [provider]  butalbital-acetaminophen-caffeine (FIORICET) 50-325-40 MG tablet TAKE 1 TABLET BY MOUTH EVERY 6 HOURS AS NEEDED FOR HEADACHE Patient taking differently: Take 1 tablet by mouth every 6 (six) hours as needed for headache. 05/23/21   [name], MD  cefdinir (OMNICEF) 300 MG capsule Take 2 capsules (600 mg total) by mouth daily for 7 days. 10/25/21 11/01/21  [name], MD  ezetimibe (ZETIA) 10 MG tablet TAKE 1 TABLET BY MOUTH EVERYDAY AT BEDTIME Patient taking differently: Take 10 mg by mouth at bedtime. 07/07/20   [name], MD  fexofenadine (ALLEGRA) 180 MG tablet Take 180 mg by mouth daily as needed for allergies or rhinitis.    [provider]  fluconazole (DIFLUCAN) 150 MG tablet Take one tablet on day you receive medication Take second tablet three days later Take third and last tablet three days later 01/26/21   Gwyneth Sprout, FNP  furosemide (LASIX) 40 MG tablet Take 1 tablet (40 mg total) by mouth daily. For 5 days then stop. 09/25/21   Nani Skillern, PA-C  glipiZIDE (GLUCOTROL) 10 MG tablet TAKE 1 TABLET BY MOUTH EVERY DAY BEFORE BREAKFAST Patient taking differently: Take 10 mg by mouth daily before breakfast. 05/30/21   [name], MD  hydrOXYzine (VISTARIL) 25 MG capsule Take 25 mg by mouth daily as needed for anxiety. 10/05/20   [provider]  irbesartan (AVAPRO) 75 MG tablet TAKE 1 TABLET BY MOUTH EVERY DAY 09/28/21   [name], MD  JARDIANCE 25 MG TABS tablet TAKE 1 TABLET (25 MG TOTAL) BY MOUTH DAILY. Patient taking differently: 25 mg daily. 07/18/21   [name], MD  [place]     [provider]  loperamide (IMODIUM) 2 MG capsule Take 2-4 mg by mouth daily as needed for diarrhea or loose stools.    [provider]  metFORMIN (GLUCOPHAGE-XR) 500 MG 24 hr tablet Take 2 tablets (1,000 mg total) by mouth daily. 10/27/20   [name], MD  methocarbamol (ROBAXIN) 500 MG tablet Take 1  tablet (500 mg total) by mouth every 6 (six) hours as needed for muscle spasms. 11/16/20   Deetta Perla, MD  metoprolol tartrate (LOPRESSOR) 50 MG tablet Take 1 tablet (50 mg total) by mouth 2 (two) times daily. 09/24/21   Lars Pinks M, PA-C  montelukast (SINGULAIR) 10 MG tablet TAKE 1 TABLET BY MOUTH EVERY DAY Patient taking differently: Take 10 mg by mouth at bedtime as needed (allergies). 05/18/20   [name], MD  Olopatadine HCl 0.7 % SOLN Place 1 drop into both eyes daily as needed (allergies).    [provider]  ondansetron (ZOFRAN ODT) 4 MG disintegrating tablet Take 1 tablet (4 mg total) by mouth every 8 (eight) hours as needed for nausea or vomiting. 09/30/21   [name], MD  pregabalin (LYRICA) 100 MG capsule Take 100 mg by mouth 3 (three) times daily. 05/18/21   [provider]  RABEprazole (ACIPHEX) 20 MG tablet Take 1 tablet (20 mg total) by mouth daily. 09/30/21   [name], MD  rosuvastatin (CRESTOR) 20 MG tablet TAKE 1 TABLET BY MOUTH EVERY DAY IN THE EVENING Patient taking differently: Take 20 mg by mouth daily. 05/30/21   [name], MD  sertraline (ZOLOFT) 100 MG tablet Take 200 mg by mouth daily.    [provider]  simethicone (MYLICON) 80 MG chewable tablet Chew 80 mg by mouth every 6 (six) hours as needed for flatulence.    [provider]  tirzepatide Darcel Bayley) 5 MG/0.5ML Pen Inject 5 mg into the skin once a week. 09/30/21   [name], MD  traZODone (DESYREL) 50 MG tablet Take 50-100 mg by mouth at bedtime as needed for sleep.     [provider]  valACYclovir (VALTREX) 1000 MG tablet Take 1 tablet daily for 5 days as needed 09/30/21   [name], MD   Allergies  Allergen Reactions   Doxycycline Other (See Comments)    Emotional changes/anxious   Biaxin [Clarithromycin]     GI upset, and bad taste in mouth.    FAMILY HISTORY:  family history includes Breast cancer in her maternal  aunt; Colon cancer in her brother; Diabetes in her father; Irritable bowel syndrome in her sister; Lung cancer in her mother. SOCIAL HISTORY:  reports that she has never smoked. She has never used smokeless tobacco. She reports that she does not drink alcohol and does not use drugs.  REVIEW OF SYSTEMS: Positives in BOLD  Constitutional: Negative for fever, chills, weight loss, malaise/fatigue and diaphoresis.  HENT: hearing loss, ear pain, nosebleeds, congestion, sore throat, neck pain, tinnitus and ear discharge.   Eyes: Negative for blurred vision, double vision, photophobia, pain, discharge and redness.  Respiratory: cough, hemoptysis, sputum production, shortness of breath, wheezing and stridor.   Cardiovascular: Negative for chest pain, palpitations, orthopnea, claudication, leg swelling and PND.  Gastrointestinal: Negative for heartburn, nausea, vomiting, abdominal pain, diarrhea, constipation, blood in stool and melena.  Genitourinary: Negative for dysuria, urgency, frequency, hematuria and flank pain.  Musculoskeletal: Negative for myalgias, back pain, joint pain and falls.  Skin: Negative for itching and rash.  Neurological: Negative for dizziness, tingling, tremors, sensory change, speech change, focal weakness, seizures, loss of consciousness, weakness and headaches.  Endo/Heme/Allergies: Negative for environmental allergies and polydipsia. Does not bruise/bleed easily.  SUBJECTIVE:  Pt states she is occasionally short of breath and coughing up white colored phlegm   VITAL SIGNS: Temp:  [98.6 F (37 C)-100.3 F (37.9 C)] 98.8 F (37.1 C) (05/22 0812) Pulse Rate:  [69-89] 89 (05/22 0812) Resp:  [18-24] 24 (05/22 0812) BP: (130-168)/(59-71) 168/66 (05/22 0812) SpO2:  [85 %-96 %] 90 % (05/22 0848) Weight:  [58.5 kg] 58.5 kg (05/22 0600)  PHYSICAL EXAMINATION: General: Acutely ill appearing female, NAD on 13L O2  Neuro: Alert and oriented, follows commands, tardive dyskinesia  present  HEENT: Supple, mild JVD present Cardiovascular: s1s2, rrr, no r/g, 2+ radial/2+ distal pulses, trace bilateral ankle edema  Lungs: Faint crackles throughout, even, non labored  Abdomen: +BS x4, soft, non tender, non distended  Musculoskeletal: Normal bulk and tone, no edema Skin: Intact no rashes or lesions present   Recent Labs  Lab 10/29/21 0648 10/30/21 0946 10/31/21 0210  NA 137 138 136  K 2.8* 3.4* 5.2*  CL 96* 97* 100  CO2 '23 28 27  '$ BUN 22 29* 30*  CREATININE 0.66 0.71 0.56  GLUCOSE 138* 210* 141*   Recent Labs  Lab 10/29/21 0648 10/30/21 0946 10/31/21 0210  HGB 10.7* 10.6* 9.8*  HCT 35.6* 34.9* 32.0*  WBC 16.5* 15.2* 14.6*  PLT 283 329 318   No results found.  ASSESSMENT / PLAN: Acute hypoxic respiratory failure secondary to CHF exacerbation, rhinovirus, and pneumonia  Hx: Bioprosthetic aortic valve replacement and single-vessel CABG 09/2021 - Supplemental O2 for dyspnea and/or  hypoxia  - Scheduled nebulized steroids and duonebs - Trend WBC and monitor fever curve  - Follow cultures  - Will start cefepime and iv solumedrol  - Aggressive pulmonary hygiene  - Cardiology consulted appreciate input  - Continue iv lasix 40 mg daily as bp permits  - CTA Chest to r/o pulmonary embolism  - Consider discontinuing amiodarone due to concern of possible amiodarone pulmonary toxicity   Donell Beers, [place] Pager [phone] (please enter 7 digits) PCCM Consult Pager [phone] (please enter 7 digits)

## 2021-10-31 NOTE — Progress Notes (Signed)
Oxygen up to 15L on bubble HFNC for SpO2 of 84%. Placed patient back on Bipap 12/6 50%. SpO2 up to 92%. Will continue to monitor.

## 2021-10-31 NOTE — Progress Notes (Signed)
Progress Note   Patient: Katie Buckley BJS:[id number] DOB: May 15, 1951 DOA: 10/25/2021     6 DOS: the patient was seen and examined on 10/31/2021   Brief hospital course: 71 y.o. female with -DM with neuropathy, HTN, diastolic CHF, severe aortic stenosis s/p TAVR on 09/19/2021 and s/p single-vessel CABG on 09/19/2021 with paroxysmal A-fib postop, currently on amiodarone  presents w/ 2-week history of shortness of breath, cough congestion and sore throat, now associated with fever up to 102, was prescribed amoxicillin by her cardiothoracic surgeon a few days prior but without improvement and her PCP ordered Omnicef a couple days ago but patient has not been taking it as prescribed PCP a few days prior on arrival of EMS O2 sat was 86% improving to the 90s on 4 L. In ED- TEMP-102.6 with pulse 71, respirations 34, BP 154/85 and O2 sat 89% on room air improving to 96% on 2 L. WBC of 8200 and lactic acid 0.8.  Hemoglobin 10.3.  CMP unremarkable, BNP 552.  COVID and flu negative.  CXR-bibasilar airspace opacities and small left effusion slightly increased from prior exam earlier on the same day.Patient given an IV fluid bolus started on Rocephin and azithromycin Hospitalist consulted for admission.  Pulmonology and cardiology consulted.  5/18 - pt with worsening hypoxia, spO2 of 84% on 6 L/min O2.  Placed on Bipap and transferred to Progressive unit.  Assessment and Plan: * Sepsis ([place]) Sepsis criteria includes fever, tachypnea, hypoxia and source of infection Continue sepsis fluids and treat pneumonia as outlined below  Acute respiratory failure with hypoxia ([place]) Patient was hypoxic to 86% with EMS requiring 4 L.  Tachypneic to 34 on arrival Supplemental O2 to keep sats over 94% and wean as tolerated  5/19: On BiPAP early this morning, weaned to 8 L/min HFNC oxygen 5/20: On BiPAP overnight, on 13 L/min HFNC this morning on rounds 5/21: Weaned to 7 L/min HFNC, declined BiPAP overnight noted  improvement in shortness of breath. 5/22: Early this morning required being placed back on BiPAP, and rising oxygen requirements overnight up to 15 L followed by BiPAP, when seen on 13 L/min HFNC  Multifocal pneumonia Patient positive for rhinovirus, covering with antibiotics for most likely secondary bacterial infection.  MRSA screen negative Repeat chest x-ray 5/20 showed extensive infiltrates in both lungs more so on the right, decreased infiltrate in the right perihilar region with possible worsening of the infiltrate in the lateral right lower lung field consistent with multifocal pneumonia versus asymmetric edema (after several days diuresis edema much less likely)  5/22: Persistent and worsened hypoxia despite several days now of IV Rocephin and Zithromax  --Pulmonology consulted, appreciate recommendations -- Antibiotic changed to cefepime --Solu-Medrol added by pulm --Antitussives, DuoNebs as needed --Scheduled Mucinex --Incentive spirometer, flutter valve --Follow blood cultures negative to date --Supplemental oxygen as needed  Rhinovirus infection Respiratory viral panel positive for rhinovirus.  Covering forpossible secondary bacterial pneumonia as outlined. Supportive care per orders. Droplet precautions.  Hypokalemia Potassium normalized with replacement.  Monitor BMP replace K as needed  5/20: K2.8, likely due to diuretics.  Replacing. 5/21: K3.4, further replacing  Paroxysmal atrial fibrillation, post CABG (HCC) Continue amiodarone and ASA 325.  Not currently on anticoagulation likely as A-fib occurred 1 time in the postop period And rapidly converted to sinus with amiodarone bolus  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 No acute issues suspected at this time  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 No acute issues suspected  Diabetes mellitus  with neuropathy (HCC) Sliding scale insulin coverage.  Will hold metformin and glipizide.  Continue  Jardiance  Essential hypertension Blood pressure under fair control.  Continue amlodipine and irbesartan and metoprolol  Acute on chronic heart failure with preserved ejection fraction (HFpEF) ([place]) Patient was not overtly decompensated on admission, however BNP elevated to over 500.  She was treated with IV fluids per sepsis protocol, subsequently developed worsening hypoxic respiratory failure requiring BiPAP --Cardiology following -- Treated with IV Lasix 40 mg twice daily (stopped 5/21) --Resume oral Lasix 40 mg daily (5/21) --Strict I/O's and daily weights --Monitor renal function and electrolytes --Continue Avapro, metoprolol, Jardiance -- Low-sodium diet fluid restriction  Echo 5/18 with preserved EF, grade 2 diastolic dysfunction.  Normocytic anemia Hemoglobin stable.  Monitor CBC        Subjective: Pt was awake sitting up in bed when seen on rounds today.  She had increased oxygen needs overnight and this morning despite weaning nicely yesterday.  She reports chronic sinus issues and very congested, can hardly breathe through her nose.  She thinks this may be why she is still needing more oxygen.  She request to have a nasal irrigation and may ask someone to go by her and [place] at the drugstore.  No other acute complaints at this time.  Physical Exam: Vitals:   10/31/21 0812 10/31/21 0848 10/31/21 1217 10/31/21 1639  BP: (!) 168/66  (!) 117/54 126/60  Pulse: 89  77 74  Resp: (!) 24  (!) 22 (!) 23  Temp: 98.8 F (37.1 C)  98.7 F (37.1 C) 98.5 F (36.9 C)  TempSrc: Oral  Oral Oral  SpO2: 90% 90% 93% 97%  Weight:      Height:       General exam: Awake and alert, no acute distress HEENT: Moist mucous membranes, hearing grossly normal, nasal cannula in place Respiratory system: Coarse lung sounds bilaterally, on 13 L/min HFNC O2. Cardiovascular system: RRR, no peripheral edema Central nervous system: Alert and oriented x3, grossly nonfocal exam, normal  speech Extremities: Moves all, no edema or cyanosis, normal tone Skin: Dry intact normal temperature, no rashes seen Psychiatry: Normal mood, congruent affect, judgment and insight appear normal   Data Reviewed:  Notable labs: Potassium 5.2, glucose 141, BUN 30, calcium 8.8, BNP 565.8 (previously 506.5 on 5/17), D-dimer 3.59  CTA chest today IMPRESSION: 1. No pulmonary embolism. 2. Bilateral extensive patchy airspace disease and ground-glass opacities with more confluent disease in the upper lobes bilaterally. Differential considerations include multifocal pneumonia versus ARDS versus pulmonary edema. 3. Mediastinal and bilateral hilar lymphadenopathy, likely reactive. 4. Aortic Atherosclerosis (ICD10-I70.0).  Chest x-ray today IMPRESSION: Slightly increased diffuse bilateral airspace opacities, RIGHT greater than LEFT.  Family Communication: none, will attempt to call   Disposition: Status is: Inpatient Remains inpatient appropriate because: severity of illness, remains with significant oxygen requirements, on IV therapies    Planned Discharge Destination: Home    Time spent: 35 minutes  Author: Ezekiel Slocumb, DO 10/31/2021 4:45 PM  For on call review [email or web address].

## 2021-10-31 NOTE — Progress Notes (Signed)
Inpatient Diabetes Program Recommendations  AACE/ADA: New Consensus Statement on Inpatient Glycemic Control   Target Ranges:  Prepandial:   less than 140 mg/dL      Peak postprandial:   less than 180 mg/dL (1-2 hours)      Critically ill patients:  140 - 180 mg/dL    Latest Reference Range & Units 10/30/21 08:14 10/30/21 11:28 10/30/21 16:47 10/30/21 21:43  Glucose-Capillary 70 - 99 mg/dL 139 (H) 212 (H) 236 (H) 110 (H)    Latest Reference Range & Units 10/31/21 02:10  Glucose 70 - 99 mg/dL 141 (H)   Review of Glycemic Control  Diabetes history: DM2 Outpatient Diabetes medications: Glipizide 10 mg QAM, Jardiance 25 mg daily, Metformin 1000 mg daily, Moujaro 5 mg Qweek Current orders for Inpatient glycemic control: Jardiance 25 mg daily, Novolog 0-15 units TID with meals, Novolog 0-5 units QHS  Inpatient Diabetes Program Recommendations:    Insulin: Please consider ordering Novolog 3 units TID with meals for meal coverage if patient eats at least 50% of meals.  Thanks, Barnie Alderman, RN, MSN, CDE Diabetes Coordinator Inpatient Diabetes Program [phone] (Team Pager from 8am to 5pm)

## 2021-10-31 NOTE — Progress Notes (Signed)
[facility] Cardiology    SUBJECTIVE: Patient states she feels much better less short of breath but still requiring supplemental oxygen no chest pain no palpitation tachycardia still feels weak requiring physical therapy and supplemental oxygen   Vitals:   10/31/21 0812 10/31/21 0848 10/31/21 1217 10/31/21 1639  BP: (!) 168/66  (!) 117/54 126/60  Pulse: 89  77 74  Resp: (!) 24  (!) 22 (!) 23  Temp: 98.8 F (37.1 C)  98.7 F (37.1 C) 98.5 F (36.9 C)  TempSrc: Oral  Oral Oral  SpO2: 90% 90% 93% 97%  Weight:      Height:         Intake/Output Summary (Last 24 hours) at 10/31/2021 1743 Last data filed at 10/31/2021 1638 Gross per 24 hour  Intake 1440 ml  Output 1625 ml  Net -185 ml      PHYSICAL EXAM  General: Well developed, well nourished, in no acute distress HEENT:  Normocephalic and atramatic Neck:  No JVD.  Lungs: Clear bilaterally to auscultation and percussion. Heart: HRRR . Normal S1 and S2 without gallops or murmurs.  Abdomen: Bowel sounds are positive, abdomen soft and non-tender  Msk:  Back normal, normal gait. Normal strength and tone for age. Extremities: No clubbing, cyanosis or edema.   Neuro: Alert and oriented X 3. Psych:  Good affect, responds appropriately   LABS: Basic Metabolic Panel: Recent Labs    10/30/21 0946 10/31/21 0210  NA 138 136  K 3.4* 5.2*  CL 97* 100  CO2 28 27  GLUCOSE 210* 141*  BUN 29* 30*  CREATININE 0.71 0.56  CALCIUM 8.9 8.8*  MG 2.4 2.3   Liver Function Tests: No results for input(s): AST, ALT, ALKPHOS, BILITOT, PROT, ALBUMIN in the last 72 hours. No results for input(s): LIPASE, AMYLASE in the last 72 hours. CBC: Recent Labs    10/30/21 0946 10/31/21 0210  WBC 15.2* 14.6*  HGB 10.6* 9.8*  HCT 34.9* 32.0*  MCV 79.3* 78.6*  PLT 329 318   Cardiac Enzymes: No results for input(s): CKTOTAL, CKMB, CKMBINDEX, TROPONINI in the last 72 hours. BNP: Invalid input(s): POCBNP D-Dimer: Recent Labs    10/31/21 0910   DDIMER 3.59*   Hemoglobin A1C: No results for input(s): HGBA1C in the last 72 hours. Fasting Lipid Panel: No results for input(s): CHOL, HDL, LDLCALC, TRIG, CHOLHDL, LDLDIRECT in the last 72 hours. Thyroid Function Tests: No results for input(s): TSH, T4TOTAL, T3FREE, THYROIDAB in the last 72 hours.  Invalid input(s): FREET3 Anemia Panel: No results for input(s): VITAMINB12, FOLATE, FERRITIN, TIBC, IRON, RETICCTPCT in the last 72 hours.  CT Angio Chest Pulmonary Embolism (PE) W or WO Contrast  Result Date: 10/31/2021 CLINICAL DATA:  Hypoxia. EXAM: CT ANGIOGRAPHY CHEST WITH CONTRAST TECHNIQUE: Multidetector CT imaging of the chest was performed using the standard protocol during bolus administration of intravenous contrast. Multiplanar CT image reconstructions and MIPs were obtained to evaluate the vascular anatomy. RADIATION DOSE REDUCTION: This exam was performed according to the departmental dose-optimization program which includes automated exposure control, adjustment of the mA and/or kV according to patient size and/or use of iterative reconstruction technique. CONTRAST:  43m OMNIPAQUE IOHEXOL 350 MG/ML SOLN COMPARISON:  06/23/2021 FINDINGS: Cardiovascular: Satisfactory opacification of the pulmonary arteries to the segmental level. No evidence of pulmonary embolism. Normal heart size. No pericardial effusion. Thoracic aortic atherosclerosis. Multi vessel coronary artery atherosclerosis. Prior CABG. Prior sternotomy with nonunion. Mediastinum/Nodes: Mediastinal lymphadenopathy with the largest subcarinal lymph node measuring 13 mm. Bilateral hilar  lymphadenopathy with the largest right hilar lymph node measuring 14 mm and left hilar lymph node measuring 11 mm. No axillary lymphadenopathy. Thyroid gland, trachea, and esophagus demonstrate no significant findings. Lungs/Pleura: Bilateral extensive patchy airspace disease and ground-glass opacities with more confluent disease in the upper lobes  bilaterally. Small bilateral pleural effusions. No pneumothorax. Upper Abdomen: No acute abnormality. Musculoskeletal: No acute osseous abnormality. No aggressive osseous lesion. Review of the MIP images confirms the above findings. IMPRESSION: 1. No pulmonary embolism. 2. Bilateral extensive patchy airspace disease and ground-glass opacities with more confluent disease in the upper lobes bilaterally. Differential considerations include multifocal pneumonia versus ARDS versus pulmonary edema. 3. Mediastinal and bilateral hilar lymphadenopathy, likely reactive. 4. Aortic Atherosclerosis (ICD10-I70.0). Electronically Signed   By: Kathreen Devoid M.D.   On: 10/31/2021 12:06   DG Chest Port 1 View  Result Date: 10/31/2021 CLINICAL DATA:  Acute respiratory failure EXAM: PORTABLE CHEST 1 VIEW COMPARISON:  10/29/2021 FINDINGS: Diffuse bilateral airspace opacities, RIGHT greater than LEFT, are slightly increased. Cardiomediastinal silhouette is obscured. Cardiac valve replacement again noted. No pneumothorax or large pleural effusion. No other significant change identified. IMPRESSION: Slightly increased diffuse bilateral airspace opacities, RIGHT greater than LEFT. Electronically Signed   By: Margarette Canada M.D.   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   Paroxysmal atrial fibrillation, post CABG ([facility])   Hypokalemia   Rhinovirus infection    Plan Continue supplemental oxygen therapy for hypoxemia  patient improving slowly No significant cardiac issues currently rate controlled no chest pain no shortness of breath minimal evidence for heart failure History of paroxysmal atrial fibrillation continue anticoagulation as necessary continue amiodarone Hypertension reasonable control continue antihypertensive Agree with broad-spectrum antibiotic therapy for sepsis Viral pneumonia bronchitis continue current therapy Single-vessel coronary disease status post coronary bypass  Surgery Patient improving reasonably stable will sign off from a cardiology standpoint reconsult if necessary Patient may probably need supplemental oxygen upon discharge Also recommend physical therapy    Yolonda Kida, MD 10/31/2021 5:43 PM

## 2021-10-31 NOTE — Progress Notes (Signed)
Oxygen requirements increased overnight. Patient was weaned to a 4 lpm nasal cannula during the day on 5/21. Upon RT arrival around 1900, patient SpO2 was 84-86% on 4 lpm. Bumped O2 to 6 lpm without significant improvement. Restarted patient on bubble HFNC at 9 lpm for SpO2 89-90%. Approx 0430, RN had to bump O2 up to 12L. Will continue to monitor.

## 2021-11-01 ENCOUNTER — Inpatient Hospital Stay: Payer: PPO

## 2021-11-01 DIAGNOSIS — J189 Pneumonia, unspecified organism: Secondary | ICD-10-CM | POA: Diagnosis not present

## 2021-11-01 DIAGNOSIS — B348 Other viral infections of unspecified site: Secondary | ICD-10-CM | POA: Diagnosis not present

## 2021-11-01 DIAGNOSIS — E1140 Type 2 diabetes mellitus with diabetic neuropathy, unspecified: Secondary | ICD-10-CM

## 2021-11-01 DIAGNOSIS — J9601 Acute respiratory failure with hypoxia: Secondary | ICD-10-CM | POA: Diagnosis not present

## 2021-11-01 DIAGNOSIS — R0600 Dyspnea, unspecified: Secondary | ICD-10-CM | POA: Diagnosis not present

## 2021-11-01 LAB — BASIC METABOLIC PANEL
Anion gap: 13 (ref 5–15)
BUN: 33 mg/dL — ABNORMAL HIGH (ref 8–23)
CO2: 26 mmol/L (ref 22–32)
Calcium: 9.3 mg/dL (ref 8.9–10.3)
Chloride: 96 mmol/L — ABNORMAL LOW (ref 98–111)
Creatinine, Ser: 0.74 mg/dL (ref 0.44–1.00)
GFR, Estimated: >60 mL/min (ref >60)
Glucose, Bld: 267 mg/dL — ABNORMAL HIGH (ref 70–99)
Potassium: 4.1 mmol/L (ref 3.5–5.1)
Sodium: 135 mmol/L (ref 135–145)

## 2021-11-01 LAB — CBC
HCT: 33.5 % — ABNORMAL LOW (ref 36.0–46.0)
Hemoglobin: 10.4 g/dL — ABNORMAL LOW (ref 12.0–15.0)
MCH: 24.1 pg — ABNORMAL LOW (ref 26.0–34.0)
MCHC: 31 g/dL (ref 30.0–36.0)
MCV: 77.7 fL — ABNORMAL LOW (ref 80.0–100.0)
Platelets: 514 10*3/uL — ABNORMAL HIGH (ref 150–400)
RBC: 4.31 MIL/uL (ref 3.87–5.11)
RDW: 17.2 % — ABNORMAL HIGH (ref 11.5–15.5)
WBC: 19.5 10*3/uL — ABNORMAL HIGH (ref 4.0–10.5)
nRBC: 0 % (ref 0.0–0.2)

## 2021-11-01 LAB — GLUCOSE, CAPILLARY
Glucose-Capillary: 248 mg/dL — ABNORMAL HIGH (ref 70–99)
Glucose-Capillary: 421 mg/dL — ABNORMAL HIGH (ref 70–99)
Glucose-Capillary: 331 mg/dL — ABNORMAL HIGH (ref 70–99)

## 2021-11-01 LAB — PROCALCITONIN: Procalcitonin: 0.76 ng/mL

## 2021-11-01 LAB — GLUCOSE, RANDOM: Glucose, Bld: 453 mg/dL — ABNORMAL HIGH (ref 70–99)

## 2021-11-01 LAB — MAGNESIUM: Magnesium: 2.6 mg/dL — ABNORMAL HIGH (ref 1.7–2.4)

## 2021-11-01 LAB — PHOSPHORUS: Phosphorus: 4.1 mg/dL (ref 2.5–4.6)

## 2021-11-01 MED ORDER — INSULIN DETEMIR 100 UNIT/ML ~~LOC~~ SOLN
6.0000 [IU] | Freq: Every day | SUBCUTANEOUS | Status: DC
  Administered 2021-11-01 – 2021-11-02 (×2): 6 [IU] via SUBCUTANEOUS
  Filled 2021-11-01 (×2): qty 0.06

## 2021-11-01 MED ORDER — INSULIN ASPART 100 UNIT/ML IJ SOLN
5.0000 [IU] | Freq: Three times a day (TID) | INTRAMUSCULAR | Status: DC
  Administered 2021-11-01 – 2021-11-02 (×4): 5 [IU] via SUBCUTANEOUS
  Filled 2021-11-01 (×2): qty 1

## 2021-11-01 MED ORDER — PREDNISONE 20 MG PO TABS
40.0000 mg | ORAL_TABLET | Freq: Every day | ORAL | Status: DC
  Administered 2021-11-02: 40 mg via ORAL
  Filled 2021-11-01: qty 2

## 2021-11-01 MED ORDER — LEVOFLOXACIN 500 MG PO TABS
750.0000 mg | ORAL_TABLET | ORAL | Status: DC
  Administered 2021-11-01: 750 mg via ORAL
  Filled 2021-11-01: qty 2

## 2021-11-01 NOTE — Progress Notes (Signed)
PT Cancellation Note  Patient Details Name: Katie Buckley MRN: [id number] DOB: 01/20/1951   Cancelled Treatment:    Reason Eval/Treat Not Completed: Other (comment) On arrival pt sitting up in bed in NAD.  Offered working with PT and pt very pleasantly states that she worked with OT earlier as well as walked multiple loops with nursing staff and feels that she has been as active as she needs to today.  Pt in good spirits and hopes to be able to d/c home tomorrow.  Kreg Shropshire, DPT 11/01/2021, 4:29 PM

## 2021-11-01 NOTE — Progress Notes (Signed)
Inpatient Diabetes Program Recommendations  AACE/ADA: New Consensus Statement on Inpatient Glycemic Control   Target Ranges:  Prepandial:   less than 140 mg/dL      Peak postprandial:   less than 180 mg/dL (1-2 hours)      Critically ill patients:  140 - 180 mg/dL    Latest Reference Range & Units 11/01/21 08:27  Glucose-Capillary 70 - 99 mg/dL 248 (H)    Latest Reference Range & Units 10/31/21 08:11 10/31/21 12:14 10/31/21 16:40 10/31/21 20:04  Glucose-Capillary 70 - 99 mg/dL 166 (H) 165 (H) 214 (H) 273 (H)   Review of Glycemic Control  Diabetes history: DM2 Outpatient Diabetes medications: Glipizide 10 mg QAM, Jardiance 25 mg daily, Metformin 1000 mg daily, Moujaro 5 mg Qweek Current orders for Inpatient glycemic control: Jardiance 25 mg daily, Novolog 0-15 units TID with meals, Novolog 0-5 units QHS, Novolog 3 units TID with meals; Solumedrol 40 mg daily  Inpatient Diabetes Program Recommendations:    Insulin: If steroids are continued as ordered, please consider ordering Levemir 6 units Q24H and increasing meal coverage to Novolog 5 units TID with meals.  Thanks, Barnie Alderman, RN, MSN, CDE Diabetes Coordinator Inpatient Diabetes Program [phone] (Team Pager from 8am to 5pm)

## 2021-11-01 NOTE — Progress Notes (Addendum)
Pt stated she was not going to wear BIPAP/CPAP because she is going home tomorrow and does not wear it at home. BIPAP remains at bedside.

## 2021-11-01 NOTE — Progress Notes (Signed)
SATURATION QUALIFICATIONS: (This note is used to comply with regulatory documentation for home oxygen)  Patient Saturations on Room Air at Rest = 96 %  RR 23  Patient Saturations on Room Air while Ambulating = 95  %  RR 41  Patient Saturations on 2 Liters of oxygen while Ambulating = 95 %  RR 35  Please briefly explain why patient needs home oxygen:

## 2021-11-01 NOTE — Evaluation (Signed)
Occupational Therapy Evaluation Patient Details Name: Katie Buckley MRN: [id number] DOB: Sep 14, 1950 Today's Date: 11/01/2021   History of Present Illness Pt is a 71 yo  female that presented to ED for two week history of SOB, cough, congestion, sore throat, fever. Worsening hypoxia on 5/18 pt was placed on bipap and transferred to progressive unit. Workup showed sepsis, multifocal PNA, rhinovirus.  PMH of aortic valve replacement with tissue valve 09/19/21, as well as CABG 09/19/2021, DM, HTN, afib, CHF.   Clinical Impression   Pt was seen for OT evaluation this date. Prior to hospital admission, pt was independent although endorses becoming more fatigued with exertional activities. Currently pt demonstrates mild impairments in activity tolerance, balance, and strength as described below (See OT problem list) which functionally limit her ability to perform ADL/self-care tasks. Pt currently requires handheld assist to [place] for ADL mobility and supervision for seated bathing tasks. Pt initially on 2L O2 then down to room air with mobility around nurses station x2 with RR up to 41 on room air (RR 32 when on 2L with SpO2 still 95%), HR stable, and SpO2 >95%. Pt educated in ECS and how to incorporate for ADL/IADL to maximize safety/indep while minimizing over exertion. Pt would benefit from skilled OT services to address noted impairments and functional limitations (see below for any additional details) in order to maximize safety and independence while minimizing falls risk and caregi3ver burden. Upon hospital discharge, recommend resumption of cardiac rehab once medically appropriate.      Recommendations for follow up therapy are one component of a multi-disciplinary discharge planning process, led by the attending physician.  Recommendations may be updated based on patient status, additional functional criteria and insurance authorization.   Follow Up Recommendations  Other (comment) (resume cardiac  rehab)    Assistance Recommended at Discharge PRN  Patient can return home with the following A little help with bathing/dressing/bathroom;Assistance with cooking/housework;Assist for transportation;Help with stairs or ramp for entrance    Functional Status Assessment  Patient has had a recent decline in their functional status and demonstrates the ability to make significant improvements in function in a reasonable and predictable amount of time.  Equipment Recommendations  Tub/shower seat    Recommendations for Other Services       Precautions / Restrictions Precautions Precautions: Fall;Sternal Precaution Comments: pt only recalls to "not lift anything" Restrictions Weight Bearing Restrictions: No      Mobility Bed Mobility Overal bed mobility: Modified Independent                  Transfers Overall transfer level: Needs assistance Equipment used: 1 person hand held assist, None Transfers: Sit to/from Stand Sit to Stand: Supervision, Min guard                  Balance Overall balance assessment: Needs assistance Sitting-balance support: Feet supported Sitting balance-Leahy Scale: Good     Standing balance support: Single extremity supported, During functional activity Standing balance-Leahy Scale: Fair Standing balance comment: with no UE support during mobility pt has tendency to cross feet with slight LOB, improved with handheld assist                           ADL either performed or assessed with clinical judgement   ADL  General ADL Comments: Pt requires increased time/effort to complete tasks, supv for bathing to ensure energy conservation/safety. Close to mod indep     Vision         Perception     Praxis      Pertinent Vitals/Pain Pain Assessment Pain Assessment: No/denies pain     Hand Dominance     Extremity/Trunk Assessment Upper Extremity  Assessment Upper Extremity Assessment: Generalized weakness   Lower Extremity Assessment Lower Extremity Assessment: Generalized weakness   Cervical / Trunk Assessment Cervical / Trunk Assessment: Normal   Communication     Cognition Arousal/Alertness: Awake/alert Behavior During Therapy: WFL for tasks assessed/performed Overall Cognitive Status: Within Functional Limits for tasks assessed                                 General Comments: PRN VC for safety     General Comments  Initially on 2L O2 then down to room air with mobility around nurses station with RR up to 41, HR stable, and SpO2 >94%.    Exercises Other Exercises Other Exercises: Pt educated in energy conservation stratgies includling activity pacing, falls prevention, home/routines modifications, work simplfication, and AE/DME as well as PLB   Shoulder Instructions      Home Living Family/patient expects to be discharged to:: Private residence Living Arrangements: Spouse/significant other Available Help at Discharge: Available 24 hours/day Type of Home: Apartment Home Access: Stairs to enter [employer] of Steps: 2 Entrance Stairs-Rails: Right;Left Home Layout: One level     Bathroom Shower/Tub: Teacher, early years/pre: Standard     Home Equipment: Cane - single Barista (2 wheels);Grab bars - tub/shower          Prior Functioning/Environment Prior Level of Function : Independent/Modified Independent                        OT Problem List: Decreased strength;Decreased activity tolerance;Decreased knowledge of use of DME or AE;Impaired balance (sitting and/or standing);Decreased safety awareness      OT Treatment/Interventions: Self-care/ADL training;Therapeutic exercise;Therapeutic activities;DME and/or AE instruction;Energy conservation;Balance training;Patient/family education    OT Goals(Current goals can be found in the care plan  section) Acute Rehab OT Goals Patient Stated Goal: go home OT Goal Formulation: With patient Time For Goal Achievement: 11/15/21 Potential to Achieve Goals: Good ADL Goals Additional ADL Goal #1: Pt will complete all aspects of bathing with mod indep, primarily from seated position, incorporating learned ECS. Additional ADL Goal #2: Pt will verbalize plan to implement at least 2 learned ECS into daily ADL/IADL routines.  OT Frequency: Min 2X/week    Co-evaluation              AM-PAC OT "6 Clicks" Daily Activity     Outcome Measure Help from another person eating meals?: None Help from another person taking care of personal grooming?: None Help from another person toileting, which includes using toliet, bedpan, or urinal?: A Little Help from another person bathing (including washing, rinsing, drying)?: A Little Help from another person to put on and taking off regular upper body clothing?: None Help from another person to put on and taking off regular lower body clothing?: None 6 Click Score: 22   End of Session    Activity Tolerance: Patient tolerated treatment well Patient left: in bed;with call bell/phone within reach  OT Visit Diagnosis: Other abnormalities of gait and mobility (  R26.89);Muscle weakness (generalized) (M62.81)                Time: 0881-1031 OT Time Calculation (min): 25 min Charges:  OT General Charges $OT Visit: 1 Visit OT Evaluation $OT Eval Moderate Complexity: 1 Mod OT Treatments $Self Care/Home Management : 8-22 mins  Ardeth Perfect., MPH, MS, OTR/L ascom [phone] 11/01/21, 1:15 PM

## 2021-11-01 NOTE — Consult Note (Addendum)
Name: Katie Buckley MRN: [id number] DOB: [birth year]    ADMISSION DATE:  10/25/2021 CONSULTATION DATE: 10/26/2021  REFERRING MD : Dr. Antonieta Pert  CHIEF COMPLAINT: Shortness of Breath   BRIEF PATIENT DESCRIPTION: 71 yo female admitted with acute hypoxic respiratory failure secondary to acute on chronic CHF exacerbation, atelectasis and suspected bronchitis vs. CAP  SIGNIFICANT EVENTS  5/16: Pt admitted to the medsurg unit 5/17: Pt developed worsening hypoxia PCCM team consulted  5/22: Pt with worsening hypoxia requiring 13L O2 via HFNC.  PCCM reconsulted.  CTA Chest ordered to r/o pulmonary embolism and cefepime started for pneumonia  5/23: FiO2 requirements much improved, weaned to 2L [place].  Transition from Solu-Medrol to prednisone, and change Cefepime to Levaquin PO  STUDIES:  5/22: CTA Chest>> IMPRESSION: 1. No pulmonary embolism. 2. Bilateral extensive patchy airspace disease and ground-glass opacities with more confluent disease in the upper lobes bilaterally. Differential considerations include multifocal pneumonia versus ARDS versus pulmonary edema. 3. Mediastinal and bilateral hilar lymphadenopathy, likely reactive. 4. Aortic Atherosclerosis (ICD10-I70.0).   HISTORY OF PRESENT ILLNESS:   This is a 71 yo female with a PMH of Anxiety, Tardive Dyskinesia,  Aortic Stenosis s/p Bioprosthetic TAVR (09/2021), Chronic Diastolic CHF (EF 65 to 84% via Echo 10/12/2021), Sinus Infection, Headache, Barrett's Esophagus, Hyperchloremia, MRSA (2018), Palpitations, Skin Cancer, and Seasonal Allergies.  She presented to [facility] ER on 05/16 via EMS from home with a 2 week hx of shortness of breath, cough, and sore throat.  Per ER notes EMS reported pt febrile temp 102 F and hypoxic on RA O2 sats 86%.  She was placed on 4L O2 via nasal canula.  Pt previously seen by her PCP on 05/16 CXR ordered and pt prescribed empiric cefdinir, however due to worsening symptoms pt notified EMS.   ED Course  Upon  arrival to the ER pt slightly confused with shortness of breath.  CXR revealed left-sided pleural effusion and mild left basilar atelectasis.  Lab results revealed BNP 552.6.  Flu A&B/Covid negative.  UA negative for UTI.  She received ceftriaxone, azithromycin, and gentle iv fluid resuscitation due to concern for possible pneumonia. She was subsequently admitted to the [facility] unit per hospitalist team for additional workup and treatment. On 05/17 pt developed worsening acute hypoxic respiratory failure PCCM consulted to assist with management.   PAST MEDICAL HISTORY :   has a past medical history of Anxiety, Aortic stenosis (01/21/2013), Frequent sinus infections, Headache, History of Barrett's esophagus, Hyperchloremia, MRSA (methicillin resistant staph aureus) culture positive (2018), Palpitations, Seasonal allergies, and Skin cancer.  has a past surgical history that includes Nasal sinus surgery (03/2013); Release of Trigger Finger (Right, 10/2010); Carpal Tunnel Symdrome (2008); Cholecystectomy (2007); Bone Spur (2007); Tonsillectomy (1960); Mandible surgery; Colonoscopy with propofol (N/A, 11/17/2015); Esophagogastroduodenoscopy (egd) with propofol (N/A, 11/17/2015); Cataract extraction w/PHACO (Left, 01/16/2019); Cardiac catheterization; Augmentation mammaplasty (Bilateral); Foot surgery; Cataract extraction w/PHACO (Right, 02/06/2019); Eye surgery (Bilateral); Anterior cervical decomp/discectomy fusion (N/A, 11/15/2020); RIGHT/LEFT HEART CATH AND CORONARY ANGIOGRAPHY (N/A, 05/24/2021); INTRAVASCULAR PRESSURE WIRE/FFR STUDY (N/A, 05/24/2021); Coronary artery bypass graft (N/A, 09/19/2021); Aortic valve replacement (N/A, 09/19/2021); and TEE without cardioversion (N/A, 09/19/2021). Prior to Admission medications   Medication Sig Start Date End Date Taking? Authorizing Provider  acetaminophen (TYLENOL) 650 MG CR tablet Take 1,300 mg by mouth every 8 (eight) hours as needed for pain (pain).    [provider]  amiodarone (PACERONE) 200 MG tablet Take 1 tablet (200 mg total) by mouth 2 (two) times daily. For  7 days then take 200 mg daily thereafter 09/24/21   Nani Skillern, PA-C  amLODipine (NORVASC) 10 MG tablet Take 1 tablet (10 mg total) by mouth daily. 09/24/21   Nani Skillern, PA-C  aspirin EC 325 MG EC tablet Take 1 tablet (325 mg total) by mouth daily. 09/24/21   Nani Skillern, PA-C  buPROPion (WELLBUTRIN SR) 150 MG 12 hr tablet Take 150 mg by mouth 2 (two) times daily. 05/25/20   [provider]  butalbital-acetaminophen-caffeine (FIORICET) 50-325-40 MG tablet TAKE 1 TABLET BY MOUTH EVERY 6 HOURS AS NEEDED FOR HEADACHE Patient taking differently: Take 1 tablet by mouth every 6 (six) hours as needed for headache. 05/23/21   [name], MD  cefdinir (OMNICEF) 300 MG capsule Take 2 capsules (600 mg total) by mouth daily for 7 days. 10/25/21 11/01/21  [name], MD  ezetimibe (ZETIA) 10 MG tablet TAKE 1 TABLET BY MOUTH EVERYDAY AT BEDTIME Patient taking differently: Take 10 mg by mouth at bedtime. 07/07/20   [name], MD  fexofenadine (ALLEGRA) 180 MG tablet Take 180 mg by mouth daily as needed for allergies or rhinitis.    [provider]  fluconazole (DIFLUCAN) 150 MG tablet Take one tablet on day you receive medication Take second tablet three days later Take third and last tablet three days later 01/26/21   Gwyneth Sprout, FNP  furosemide (LASIX) 40 MG tablet Take 1 tablet (40 mg total) by mouth daily. For 5 days then stop. 09/25/21   Nani Skillern, PA-C  glipiZIDE (GLUCOTROL) 10 MG tablet TAKE 1 TABLET BY MOUTH EVERY DAY BEFORE BREAKFAST Patient taking differently: Take 10 mg by mouth daily before breakfast. 05/30/21   [name], MD  hydrOXYzine (VISTARIL) 25 MG capsule Take 25 mg by mouth daily as needed for anxiety. 10/05/20   [provider]  irbesartan (AVAPRO) 75 MG tablet TAKE 1 TABLET BY MOUTH EVERY DAY 09/28/21    [name], MD  JARDIANCE 25 MG TABS tablet TAKE 1 TABLET (25 MG TOTAL) BY MOUTH DAILY. Patient taking differently: 25 mg daily. 07/18/21   [name], MD  [place]     [provider]  loperamide (IMODIUM) 2 MG capsule Take 2-4 mg by mouth daily as needed for diarrhea or loose stools.    [provider]  metFORMIN (GLUCOPHAGE-XR) 500 MG 24 hr tablet Take 2 tablets (1,000 mg total) by mouth daily. 10/27/20   [name], MD  methocarbamol (ROBAXIN) 500 MG tablet Take 1 tablet (500 mg total) by mouth every 6 (six) hours as needed for muscle spasms. 11/16/20   Deetta Perla, MD  metoprolol tartrate (LOPRESSOR) 50 MG tablet Take 1 tablet (50 mg total) by mouth 2 (two) times daily. 09/24/21   Lars Pinks M, PA-C  montelukast (SINGULAIR) 10 MG tablet TAKE 1 TABLET BY MOUTH EVERY DAY Patient taking differently: Take 10 mg by mouth at bedtime as needed (allergies). 05/18/20   [name], MD  Olopatadine HCl 0.7 % SOLN Place 1 drop into both eyes daily as needed (allergies).    [provider]  ondansetron (ZOFRAN ODT) 4 MG disintegrating tablet Take 1 tablet (4 mg total) by mouth every 8 (eight) hours as needed for nausea or vomiting. 09/30/21   [name], MD  pregabalin (LYRICA) 100 MG capsule Take 100 mg by mouth 3 (three) times daily. 05/18/21   [provider]  RABEprazole (ACIPHEX) 20 MG  tablet Take 1 tablet (20 mg total) by mouth daily. 09/30/21   [name], MD  rosuvastatin (CRESTOR) 20 MG tablet TAKE 1 TABLET BY MOUTH EVERY DAY IN THE EVENING Patient taking differently: Take 20 mg by mouth daily. 05/30/21   [name], MD  sertraline (ZOLOFT) 100 MG tablet Take 200 mg by mouth daily.    [provider]  simethicone (MYLICON) 80 MG chewable tablet Chew 80 mg by mouth every 6 (six) hours as needed for flatulence.    [provider]  tirzepatide Darcel Bayley) 5 MG/0.5ML Pen Inject 5 mg into the  skin once a week. 09/30/21   [name], MD  traZODone (DESYREL) 50 MG tablet Take 50-100 mg by mouth at bedtime as needed for sleep.     [provider]  valACYclovir (VALTREX) 1000 MG tablet Take 1 tablet daily for 5 days as needed 09/30/21   [name], MD   Allergies  Allergen Reactions   Doxycycline Other (See Comments)    Emotional changes/anxious   Biaxin [Clarithromycin]     GI upset, and bad taste in mouth.    FAMILY HISTORY:  family history includes Breast cancer in her maternal aunt; Colon cancer in her brother; Diabetes in her father; Irritable bowel syndrome in her sister; Lung cancer in her mother. SOCIAL HISTORY:  reports that she has never smoked. She has never used smokeless tobacco. She reports that she does not drink alcohol and does not use drugs.  REVIEW OF SYSTEMS: Positives in BOLD  Constitutional: Negative for fever, chills, weight loss, malaise/fatigue and diaphoresis.  HENT: hearing loss, ear pain, nosebleeds, congestion, sore throat, neck pain, tinnitus and ear discharge.   Eyes: Negative for blurred vision, double vision, photophobia, pain, discharge and redness.  Respiratory: cough, hemoptysis, sputum production, shortness of breath, wheezing and stridor.   Cardiovascular: Negative for chest pain, palpitations, orthopnea, claudication, leg swelling and PND.  Gastrointestinal: Negative for heartburn, nausea, vomiting, abdominal pain, diarrhea, constipation, blood in stool and melena.  Genitourinary: Negative for dysuria, urgency, frequency, hematuria and flank pain.  Musculoskeletal: Negative for myalgias, back pain, joint pain and falls.  Skin: Negative for itching and rash.  Neurological: Negative for dizziness, tingling, tremors, sensory change, speech change, focal weakness, seizures, loss of consciousness, weakness and headaches.  Endo/Heme/Allergies: Negative for environmental allergies and polydipsia. Does not bruise/bleed  easily.  SUBJECTIVE / INTERVAL HISTORY:  -Pt reports shortness of breath and cough, but much improved -Weaned from 10 L to 2L [place] today -Afebrile, hemodynamically stable -Patient very eager to go home today ~ have discussed with her would wait another 24 hours for discharge as therapy was escalated yesterday with IV steroids and escalation of antibiotics to cefepime -2.1 L of urine output with diuresis yesterday (-1.2 L since admit), creatinine remained stable -Worsening of leukocytosis to 19.5 K (14.6 yesterday), likely related to steroids which were begun yesterday  VITAL SIGNS: Temp:  [97.5 F (36.4 C)-98.7 F (37.1 C)] 97.5 F (36.4 C) (05/23 0825) Pulse Rate:  [74-86] 86 (05/23 0825) Resp:  [18-24] 18 (05/23 0825) BP: (117-152)/(54-76) 152/65 (05/23 0825) SpO2:  [90 %-97 %] 94 % (05/23 0825)  PHYSICAL EXAMINATION: General: Acutely ill appearing female, NAD on 2L O2  Neuro: Alert and oriented, follows commands, tardive dyskinesia present  HEENT: Supple, mild JVD present Cardiovascular: s1s2, rrr, no r/g, 2+ radial/2+ distal pulses, trace bilateral ankle edema  Lungs: Faint crackles to bilateral bases, mild inspiratory wheeze left lower field, even,  non labored  Abdomen: +BS x4, soft, non tender, non distended  Musculoskeletal: Normal bulk and tone, no edema Skin: Intact no rashes or lesions present   Recent Labs  Lab 10/29/21 0648 10/30/21 0946 10/31/21 0210  NA 137 138 136  K 2.8* 3.4* 5.2*  CL 96* 97* 100  CO2 '23 28 27  '$ BUN 22 29* 30*  CREATININE 0.66 0.71 0.56  GLUCOSE 138* 210* 141*    Recent Labs  Lab 10/30/21 0946 10/31/21 0210 11/01/21 0728  HGB 10.6* 9.8* 10.4*  HCT 34.9* 32.0* 33.5*  WBC 15.2* 14.6* 19.5*  PLT 329 318 514*    CT Angio Chest Pulmonary Embolism (PE) W or WO Contrast  Result Date: 10/31/2021 CLINICAL DATA:  Hypoxia. EXAM: CT ANGIOGRAPHY CHEST WITH CONTRAST TECHNIQUE: Multidetector CT imaging of the chest was performed using the  standard protocol during bolus administration of intravenous contrast. Multiplanar CT image reconstructions and MIPs were obtained to evaluate the vascular anatomy. RADIATION DOSE REDUCTION: This exam was performed according to the departmental dose-optimization program which includes automated exposure control, adjustment of the mA and/or kV according to patient size and/or use of iterative reconstruction technique. CONTRAST:  11m OMNIPAQUE IOHEXOL 350 MG/ML SOLN COMPARISON:  06/23/2021 FINDINGS: Cardiovascular: Satisfactory opacification of the pulmonary arteries to the segmental level. No evidence of pulmonary embolism. Normal heart size. No pericardial effusion. Thoracic aortic atherosclerosis. Multi vessel coronary artery atherosclerosis. Prior CABG. Prior sternotomy with nonunion. Mediastinum/Nodes: Mediastinal lymphadenopathy with the largest subcarinal lymph node measuring 13 mm. Bilateral hilar lymphadenopathy with the largest right hilar lymph node measuring 14 mm and left hilar lymph node measuring 11 mm. No axillary lymphadenopathy. Thyroid gland, trachea, and esophagus demonstrate no significant findings. Lungs/Pleura: Bilateral extensive patchy airspace disease and ground-glass opacities with more confluent disease in the upper lobes bilaterally. Small bilateral pleural effusions. No pneumothorax. Upper Abdomen: No acute abnormality. Musculoskeletal: No acute osseous abnormality. No aggressive osseous lesion. Review of the MIP images confirms the above findings. IMPRESSION: 1. No pulmonary embolism. 2. Bilateral extensive patchy airspace disease and ground-glass opacities with more confluent disease in the upper lobes bilaterally. Differential considerations include multifocal pneumonia versus ARDS versus pulmonary edema. 3. Mediastinal and bilateral hilar lymphadenopathy, likely reactive. 4. Aortic Atherosclerosis (ICD10-I70.0). Electronically Signed   By: HKathreen DevoidM.D.   On: 10/31/2021 12:06    DG Chest Port 1 View  Result Date: 11/01/2021 CLINICAL DATA:  Acute respiratory failure with hypoxia. EXAM: PORTABLE CHEST 1 VIEW COMPARISON:  Oct 31, 2021. FINDINGS: Stable cardiomediastinal silhouette. Status post aortic valve repair. Stable bilateral diffuse lung opacities are noted concerning for multifocal pneumonia. Bony thorax is unremarkable. IMPRESSION: Stable bilateral lung opacities as described above. Electronically Signed   By: JMarijo ConceptionM.D.   On: 11/01/2021 08:21   DG Chest Port 1 View  Result Date: 10/31/2021 CLINICAL DATA:  Acute respiratory failure EXAM: PORTABLE CHEST 1 VIEW COMPARISON:  10/29/2021 FINDINGS: Diffuse bilateral airspace opacities, RIGHT greater than LEFT, are slightly increased. Cardiomediastinal silhouette is obscured. Cardiac valve replacement again noted. No pneumothorax or large pleural effusion. No other significant change identified. IMPRESSION: Slightly increased diffuse bilateral airspace opacities, RIGHT greater than LEFT. Electronically Signed   By: JMargarette CanadaM.D.   On: 10/31/2021 09:33    ASSESSMENT / PLAN: Acute hypoxic respiratory failure secondary to CHF exacerbation, rhinovirus, and pneumonia  Hx: Bioprosthetic aortic valve replacement and single-vessel CABG 09/2021 - Supplemental O2 for dyspnea and/or hypoxia  - Scheduled nebulized steroids and duonebs -  Trend WBC and monitor fever curve  - Follow cultures  - Transition from cefepime to Levaquin (plan for 5 days) in preparation for discharge -Transition from IV Solu-Medrol to p.o. prednisone beginning tomorrow 5/24 (would DC home on steroid taper) - Aggressive pulmonary hygiene  - Cardiology following, appreciate input  - Continue iv lasix 40 mg daily as bp and renal function permits  - CTA Chest 5/22 negative for pulmonary embolism, with bilateral patchy airspace opacities concerning for multifocal PNA vs. Pulmonary edema vs. ARDS  - Consider discontinuing amiodarone due to concern  of possible amiodarone pulmonary toxicity        Darel Hong, AGACNP-BC [facility] Prefer epic messenger for cross cover needs If after hours, please call E-link

## 2021-11-01 NOTE — Progress Notes (Signed)
Progress Note   Patient: Katie Buckley MHD:[id number] DOB: 1950-12-07 DOA: 10/25/2021     7 DOS: the patient was seen and examined on 11/01/2021   Brief hospital course: 71 y.o. female with -DM with neuropathy, HTN, diastolic CHF, severe aortic stenosis s/p TAVR on 09/19/2021 and s/p single-vessel CABG on 09/19/2021 with paroxysmal A-fib postop, currently on amiodarone  presents w/ 2-week history of shortness of breath, cough congestion and sore throat, now associated with fever up to 102, was prescribed amoxicillin by her cardiothoracic surgeon a few days prior but without improvement and her PCP ordered Omnicef a couple days ago but patient has not been taking it as prescribed PCP a few days prior on arrival of EMS O2 sat was 86% improving to the 90s on 4 L. In ED- TEMP-102.6 with pulse 71, respirations 34, BP 154/85 and O2 sat 89% on room air improving to 96% on 2 L. WBC of 8200 and lactic acid 0.8.  Hemoglobin 10.3.  CMP unremarkable, BNP 552.  COVID and flu negative.  CXR-bibasilar airspace opacities and small left effusion slightly increased from prior exam earlier on the same day.Patient given an IV fluid bolus started on Rocephin and azithromycin Hospitalist consulted for admission.  Pulmonology and cardiology consulted.  5/18 - pt with worsening hypoxia, spO2 of 84% on 6 L/min O2.  Placed on Bipap and transferred to Progressive unit.  Assessment and Plan: * Sepsis ([place]) Sepsis criteria includes fever, tachypnea, hypoxia and source of infection Continue sepsis fluids and treat pneumonia as outlined below  Acute respiratory failure with hypoxia ([place]) Patient was hypoxic to 86% with EMS requiring 4 L.  Tachypneic to 34 on arrival Supplemental O2 to keep sats over 94% and wean as tolerated  5/19: On BiPAP early this morning, weaned to 8 L/min HFNC oxygen 5/20: On BiPAP overnight, on 13 L/min HFNC this morning on rounds 5/21: Weaned to 7 L/min HFNC, declined BiPAP overnight noted  improvement in shortness of breath. 5/22: Early this morning required being placed back on BiPAP, and rising oxygen requirements overnight up to 15 L followed by BiPAP, when seen on 13 L/min HFNC 5/23: Weaned significantly this morning down to 1-2 L/min, maintain sats with ambulation per OT  Multifocal pneumonia Patient positive for rhinovirus, covering with antibiotics for most likely secondary bacterial infection.  MRSA screen negative Repeat chest x-ray 5/20 showed extensive infiltrates in both lungs more so on the right, decreased infiltrate in the right perihilar region with possible worsening of the infiltrate in the lateral right lower lung field consistent with multifocal pneumonia versus asymmetric edema (after several days diuresis edema much less likely)  5/22: Persistent and worsened hypoxia despite several days now of IV Rocephin and Zithromax.  Antibiotics escalated to cefepime and IV Solu-Medrol was added 5/23: Significantly better oxygen needs and clinical improvement the past 24 hours  --Pulmonology consulted, appreciate recommendations -- Transition to p.o. Levaquin --Solu-Medrol added by pulm, transition to prednisone tomorrow --Antitussives, DuoNebs as needed --Scheduled Mucinex --Incentive spirometer, flutter valve --Follow blood cultures negative to date --Supplemental oxygen as needed  Rhinovirus infection Respiratory viral panel positive for rhinovirus.  Covering forpossible secondary bacterial pneumonia as outlined. Supportive care per orders. Droplet precautions.  Hypokalemia Potassium normalized with replacement.  Monitor BMP replace K as needed  K today 4.1 normal  Paroxysmal atrial fibrillation, post CABG (HCC) Continue amiodarone and ASA 325.  Not currently on anticoagulation likely as A-fib occurred 1 time in the postop period And rapidly converted to sinus  with amiodarone bolus  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 No acute issues suspected at this time  Status  post aortic valve replacement with tissue valve 09/19/21 No acute issues suspected  Diabetes mellitus with neuropathy (HCC) Sliding scale insulin coverage.  Will hold metformin and glipizide.  Continue Jardiance  5/23: Now with significant hyperglycemia secondary to IV steroids.  Insulin was increased  Essential hypertension Blood pressure under fair control.  Continue amlodipine and irbesartan and metoprolol  Acute on chronic heart failure with preserved ejection fraction (HFpEF) ([place]) Patient was not overtly decompensated on admission, however BNP elevated to over 500.  She was treated with IV fluids per sepsis protocol, subsequently developed worsening hypoxic respiratory failure requiring BiPAP --Cardiology following -- Treated with IV Lasix 40 mg twice daily (stopped 5/21) --Resume oral Lasix 40 mg daily (5/21) --Strict I/O's and daily weights --Monitor renal function and electrolytes --Continue Avapro, metoprolol, Jardiance -- Low-sodium diet fluid restriction  Echo 5/18 with preserved EF, grade 2 diastolic dysfunction.  Normocytic anemia Hemoglobin stable.  Monitor CBC        Subjective: Pt was awake seated edge of bed after working with OT this morning and walking around the unit.  She reports feeling much better today less shortness of breath and on a lot less oxygen.  Just this morning has been weaned from 10 L down to 1 to 2 L and maintaining well.  She really wants to go home today.  Discussed with pulmonology who would like to watch her for another 24 hours and I agree with this given how rapidly she bleed to just this morning.  She is reluctantly agreeable.  Physical Exam: Vitals:   11/01/21 1137 11/01/21 1140 11/01/21 1148 11/01/21 1248  BP:    136/84  Pulse:    74  Resp: (!) 35 (!) 41 (!) 21 17  Temp:    98.2 F (36.8 C)  TempSrc:      SpO2: 95% 95% 98% 92%  Weight:      Height:       General exam: Awake and alert, no acute distress HEENT: Moist mucous  membranes, hearing grossly normal, nasal cannula in place Respiratory system: Normal respiratory effort, lungs clear with diminished bases, on 1 L/min  O2. Cardiovascular system: RRR, no peripheral edema Central nervous system: Alert and oriented x3, grossly nonfocal exam, normal speech Extremities: Moves all, no edema or cyanosis, normal tone Skin: Dry intact normal temperature, no rashes seen Psychiatry: Normal mood, congruent affect, judgment and insight appear normal   Data Reviewed:  Notable labs: Chloride 96, glucose 267, BUN 33, magnesium 2.6, procalcitonin improved 0.76, WBC rising likely due to steroids 19.5, hemoglobin 10.4, platelets 514 likely reactive.   Family Communication: none, will attempt to call   Disposition: Status is: Inpatient Remains inpatient appropriate because: severity of illness, while oxygen requirement has significantly come down this morning, she warrants 24 hours further close monitoring given how slowly she has improved and oxygen requirements have fluctuated significantly, she was still on 10 L this morning.    Planned Discharge Destination: Home    Time spent: 35 minutes  Author: Ezekiel Slocumb, DO 11/01/2021 2:44 PM  For on call review [email or web address].

## 2021-11-02 ENCOUNTER — Other Ambulatory Visit (HOSPITAL_COMMUNITY): Payer: Self-pay

## 2021-11-02 ENCOUNTER — Telehealth: Payer: Self-pay

## 2021-11-02 DIAGNOSIS — J9601 Acute respiratory failure with hypoxia: Secondary | ICD-10-CM | POA: Diagnosis not present

## 2021-11-02 LAB — BASIC METABOLIC PANEL
Anion gap: 10 (ref 5–15)
BUN: 29 mg/dL — ABNORMAL HIGH (ref 8–23)
CO2: 28 mmol/L (ref 22–32)
Calcium: 8.9 mg/dL (ref 8.9–10.3)
Chloride: 99 mmol/L (ref 98–111)
Creatinine, Ser: 0.67 mg/dL (ref 0.44–1.00)
GFR, Estimated: >60 mL/min (ref >60)
Glucose, Bld: 97 mg/dL (ref 70–99)
Potassium: 3.5 mmol/L (ref 3.5–5.1)
Sodium: 137 mmol/L (ref 135–145)

## 2021-11-02 LAB — CBC
HCT: 32.4 % — ABNORMAL LOW (ref 36.0–46.0)
Hemoglobin: 10.3 g/dL — ABNORMAL LOW (ref 12.0–15.0)
MCH: 24.5 pg — ABNORMAL LOW (ref 26.0–34.0)
MCHC: 31.8 g/dL (ref 30.0–36.0)
MCV: 77 fL — ABNORMAL LOW (ref 80.0–100.0)
Platelets: 496 10*3/uL — ABNORMAL HIGH (ref 150–400)
RBC: 4.21 MIL/uL (ref 3.87–5.11)
RDW: 17.5 % — ABNORMAL HIGH (ref 11.5–15.5)
WBC: 17.9 10*3/uL — ABNORMAL HIGH (ref 4.0–10.5)
nRBC: 0 % (ref 0.0–0.2)

## 2021-11-02 LAB — PROCALCITONIN: Procalcitonin: 0.47 ng/mL

## 2021-11-02 LAB — GLUCOSE, CAPILLARY: Glucose-Capillary: 110 mg/dL — ABNORMAL HIGH (ref 70–99)

## 2021-11-02 MED ORDER — IPRATROPIUM-ALBUTEROL 0.5-2.5 (3) MG/3ML IN SOLN: 3.0000 mL | Freq: Two times a day (BID) | RESPIRATORY_TRACT | Status: DC

## 2021-11-02 MED ORDER — PREDNISONE 20 MG PO TABS
40.0000 mg | ORAL_TABLET | Freq: Every day | ORAL | 0 refills | Status: AC
Start: 2021-11-03 — End: 2021-11-05

## 2021-11-02 MED ORDER — MONTELUKAST SODIUM 10 MG PO TABS: 10.0000 mg | ORAL_TABLET | Freq: Every evening | ORAL | Status: DC | PRN

## 2021-11-02 MED ORDER — APIXABAN 5 MG PO TABS: 5.0000 mg | ORAL_TABLET | Freq: Two times a day (BID) | ORAL | 2 refills | Status: AC

## 2021-11-02 MED ORDER — APIXABAN 5 MG PO TABS
5.0000 mg | ORAL_TABLET | Freq: Two times a day (BID) | ORAL | Status: DC
Start: 2021-11-02 — End: 2021-11-02
  Administered 2021-11-02: 5 mg via ORAL
  Filled 2021-11-02: qty 1

## 2021-11-02 MED ORDER — ASPIRIN 81 MG PO TBEC: 81.0000 mg | DELAYED_RELEASE_TABLET | Freq: Every day | ORAL | Status: DC

## 2021-11-02 MED ORDER — AMIODARONE HCL 200 MG PO TABS: 200.0000 mg | ORAL_TABLET | Freq: Every day | ORAL | 1 refills | Status: DC

## 2021-11-02 MED ORDER — LEVOFLOXACIN 750 MG PO TABS: 750.0000 mg | ORAL_TABLET | Freq: Every day | ORAL | 0 refills | Status: AC

## 2021-11-02 MED ORDER — ASPIRIN 81 MG PO TBEC
81.0000 mg | DELAYED_RELEASE_TABLET | Freq: Every day | ORAL | Status: DC
  Administered 2021-11-02: 81 mg via ORAL
  Filled 2021-11-02: qty 1

## 2021-11-02 NOTE — Telephone Encounter (Signed)
Lvm to return call- 1st attempt

## 2021-11-02 NOTE — TOC Benefit Eligibility Note (Signed)
Patient Advocate Encounter  Insurance verification completed.    The patient is currently admitted and upon discharge could be taking Eliquis 5 mg.  The current 30 day co-pay is, $45.00.   The patient is insured through [employer] Part D     Milanie Rosenfield, CPhT Pharmacy Patient Advocate Specialist [facility] Patient Advocate Team Direct Number: [phone]  Fax: [phone]        

## 2021-11-02 NOTE — Progress Notes (Incomplete)
Discharge instructions (including medications) discussed with and copy provided to patient/caregiver 

## 2021-11-02 NOTE — Progress Notes (Signed)
Inpatient Diabetes Program Recommendations  AACE/ADA: New Consensus Statement on Inpatient Glycemic Control   Target Ranges:  Prepandial:   less than 140 mg/dL      Peak postprandial:   less than 180 mg/dL (1-2 hours)      Critically ill patients:  140 - 180 mg/dL    Latest Reference Range & Units 11/01/21 08:27 11/01/21 12:45 11/01/21 16:37  Glucose-Capillary 70 - 99 mg/dL 248 (H) 421 (H) 331 (H)    Review of Glycemic Control  Diabetes history: DM2 Outpatient Diabetes medications: Glipizide 10 mg QAM, Jardiance 25 mg daily, Metformin 1000 mg daily, Moujaro 5 mg Qweek Current orders for Inpatient glycemic control: Levemir 6 units daily, Jardiance 25 mg daily, Novolog 0-15 units TID with meals, Novolog 0-5 units QHS, Novolog 5 units TID with meals; Prednisone 40 mg daily  Inpatient Diabetes Program Recommendations:    Insulin: Noted steroids decreased from Solumedrol to Prednisone. If steroids are continued, please consider increasing Levemir to 10 units daily.  Thanks, Barnie Alderman, RN, MSN, [place] [facility] Coordinator Inpatient Diabetes Program [phone] (Team Pager from 8am to [place])

## 2021-11-02 NOTE — Discharge Summary (Signed)
Physician Discharge Summary   Katie Buckley  female DOB: 1950/07/12  TIR:[id number]  PCP: [name], MD  Admit date: 10/25/2021 Discharge date: 11/02/2021  Admitted From: home Disposition:  home CODE STATUS: Full code  Discharge Instructions     Diet - low sodium heart healthy   Complete by: As directed       Hospital Course:  For full details, please see H&P, progress notes, consult notes and ancillary notes.  Briefly,  Katie Buckley is a 71 y.o. female with DM, HTN, diastolic CHF, severe aortic stenosis s/p TAVR on 09/19/2021 and s/p single-vessel CABG on 09/19/2021 with paroxysmal A-fib postop, currently on amiodarone presented w/ 2-week history of shortness of breath, cough congestion and sore throat, associated with fever up to 102.    * Sepsis ([place]) Sepsis criteria includes fever, tachypnea, and source of infection PNA.  Acute respiratory failure with hypoxia ([place]) Patient was hypoxic to 86% with EMS requiring 4 L.  Tachypneic to 34 on arrival.  Hypoxia due to Rhinovirus infection that likely caused COPD exacerbation and secondary bacterial PNA. On 5/18, pt with worsening hypoxia, spO2 of 84% on 6 L/min O2.  Likely due to IV fluids per sepsis protocol.  Placed on Bipap and transferred to Progressive unit, and consulted pulm.  Pt was diuresed  --Respiratory status and O2 requirement improved quickly on 5/23.  Pt was weaned down to RA prior to discharge.  Multifocal pneumonia Rhinovirus infection Patient positive for rhinovirus, covering with antibiotics for most likely secondary bacterial infection.  MRSA screen negative Repeat chest x-ray 5/20 showed extensive infiltrates in both lungs more so on the right, decreased infiltrate in the right perihilar region with possible worsening of the infiltrate in the lateral right lower lung field consistent with multifocal pneumonia versus asymmetric edema (after several days diuresis edema much less likely). --pt  completed 5 days of ceftriaxone and azithromycin, but still had persistent and worsened hypoxia, so Antibiotics escalated to cefepime and IV Solu-Medrol was added.   --Pt was discharged on 4 more days of Levaquin and 2 more days of prednisone 40 mg daily.   Acute on chronic heart failure with preserved ejection fraction (HFpEF) ([place]) Patient was not overtly decompensated on admission, however BNP elevated to over 500.  She was treated with IV fluids per sepsis protocol, subsequently developed worsening hypoxic respiratory failure requiring BiPAP. --Cardiology following --Echo 5/18 with preserved EF, grade 2 diastolic dysfunction. -- Treated with IV Lasix 40 mg twice daily and transitioned to oral Lasix 40 mg daily on 5/21. --pt was not on home diuretic PTA, and was not discharged on diuretic.  Hypokalemia --repleted PRN   Paroxysmal atrial fibrillation, post CABG [facility]) Continue home amiodarone  --pt was started on Eliquis by cardio during this hospitalization. and ASA 325.   S/P CABG x 1 on 09/19/21 No acute issues suspected at this time --cont ASA 81, statin   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 No acute issues suspected   Diabetes mellitus with neuropathy (HCC) Hyperglycemia due to steroid use --A1c 8.0.   --received insulin during hospitalization due to hyperglycemia from steroid use.   --discharged back on home diabetic regimen as below.   Essential hypertension Blood pressure under fair control.   Continue home amlodipine and irbesartan and metoprolol   Normocytic anemia Hemoglobin stable between 9-10's.    Discharge Diagnoses:  Principal Problem:   Sepsis ([place]) Active Problems:   Multifocal pneumonia   Acute respiratory failure  with hypoxia (HCC)   Normocytic anemia   Acute on chronic heart failure with preserved ejection fraction (HFpEF) ([place])   Essential hypertension   Diabetes mellitus with neuropathy (HCC)   Status post aortic  valve replacement with tissue valve 09/19/21   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   Paroxysmal atrial fibrillation, post CABG (HCC)   Hypokalemia   Rhinovirus infection   30 Day Unplanned Readmission Risk Score    Flowsheet Row ED to Hosp-Admission (Current) from 10/25/2021 in [facility]  30 Day Unplanned Readmission Risk Score (%) 34.9 Filed at 11/02/2021 0801       This score is the patient's risk of an unplanned readmission within 30 days of being discharged (0 -100%). The score is based on dignosis, age, lab data, medications, orders, and past utilization.   Low:  0-14.9   Medium: 15-21.9   High: 22-29.9   Extreme: 30 and above         Discharge Instructions:  Allergies as of 11/02/2021       Reactions   Doxycycline Other (See Comments)   Emotional changes/anxious   Biaxin [clarithromycin]    GI upset, and bad taste in mouth.        Medication List     STOP taking these medications    cefdinir 300 MG capsule Commonly known as: OMNICEF   fluconazole 150 MG tablet Commonly known as: DIFLUCAN   furosemide 40 MG tablet Commonly known as: LASIX       TAKE these medications    acetaminophen 650 MG CR tablet Commonly known as: TYLENOL Take 1,300 mg by mouth every 8 (eight) hours as needed for pain (pain).   amiodarone 200 MG tablet Commonly known as: PACERONE Take 1 tablet (200 mg total) by mouth daily. Home med. What changed:  when to take this additional instructions   amLODipine 10 MG tablet Commonly known as: NORVASC Take 1 tablet (10 mg total) by mouth daily.   apixaban 5 MG Tabs tablet Commonly known as: ELIQUIS Take 1 tablet (5 mg total) by mouth 2 (two) times daily.   aspirin EC 81 MG tablet Take 1 tablet (81 mg total) by mouth daily. Swallow whole. Start taking on: Nov 03, 2021 What changed:  medication strength how much to take additional instructions   buPROPion 150 MG 12 hr tablet Commonly known as: WELLBUTRIN SR Take  150 mg by mouth 2 (two) times daily.   butalbital-acetaminophen-caffeine 50-325-40 MG tablet Commonly known as: FIORICET TAKE 1 TABLET BY MOUTH EVERY 6 HOURS AS NEEDED FOR HEADACHE What changed: See the new instructions.   ezetimibe 10 MG tablet Commonly known as: ZETIA TAKE 1 TABLET BY MOUTH EVERYDAY AT BEDTIME What changed: See the new instructions.   fexofenadine 180 MG tablet Commonly known as: ALLEGRA Take 180 mg by mouth daily as needed for allergies or rhinitis.   glipiZIDE 10 MG tablet Commonly known as: GLUCOTROL TAKE 1 TABLET BY MOUTH EVERY DAY BEFORE BREAKFAST What changed: See the new instructions.   hydrOXYzine 25 MG capsule Commonly known as: VISTARIL Take 25 mg by mouth daily as needed for anxiety.   irbesartan 75 MG tablet Commonly known as: AVAPRO TAKE 1 TABLET BY MOUTH EVERY DAY   Jardiance 25 MG Tabs tablet Generic drug: empagliflozin TAKE 1 TABLET (25 MG TOTAL) BY MOUTH DAILY. What changed:  how much to take how to take this   Lancet Devices Misc   levofloxacin 750 MG tablet Commonly known as: LEVAQUIN  Take 1 tablet (750 mg total) by mouth daily for 4 days.   loperamide 2 MG capsule Commonly known as: IMODIUM Take 2-4 mg by mouth daily as needed for diarrhea or loose stools.   metFORMIN 500 MG 24 hr tablet Commonly known as: GLUCOPHAGE-XR Take 2 tablets (1,000 mg total) by mouth daily.   methocarbamol 500 MG tablet Commonly known as: ROBAXIN Take 1 tablet (500 mg total) by mouth every 6 (six) hours as needed for muscle spasms.   metoprolol tartrate 50 MG tablet Commonly known as: LOPRESSOR Take 1 tablet (50 mg total) by mouth 2 (two) times daily.   montelukast 10 MG tablet Commonly known as: SINGULAIR Take 1 tablet (10 mg total) by mouth at bedtime as needed (allergies). Home med. What changed:  when to take this reasons to take this additional instructions   Olopatadine HCl 0.7 % Soln Place 1 drop into both eyes daily as needed  (allergies).   ondansetron 4 MG disintegrating tablet Commonly known as: Zofran ODT Take 1 tablet (4 mg total) by mouth every 8 (eight) hours as needed for nausea or vomiting.   predniSONE 20 MG tablet Commonly known as: DELTASONE Take 2 tablets (40 mg total) by mouth daily with breakfast for 2 days. Start taking on: Nov 03, 2021   pregabalin 100 MG capsule Commonly known as: LYRICA Take 100 mg by mouth 3 (three) times daily.   RABEprazole 20 MG tablet Commonly known as: Aciphex Take 1 tablet (20 mg total) by mouth daily.   rosuvastatin 20 MG tablet Commonly known as: CRESTOR TAKE 1 TABLET BY MOUTH EVERY DAY IN THE EVENING What changed:  how much to take how to take this when to take this   sertraline 100 MG tablet Commonly known as: ZOLOFT Take 200 mg by mouth daily.   simethicone 80 MG chewable tablet Commonly known as: MYLICON Chew 80 mg by mouth every 6 (six) hours as needed for flatulence.   tirzepatide 5 MG/0.5ML Pen Commonly known as: MOUNJARO Inject 5 mg into the skin once a week.   traZODone 50 MG tablet Commonly known as: DESYREL Take 50-100 mg by mouth at bedtime as needed for sleep.   valACYclovir 1000 MG tablet Commonly known as: Valtrex Take 1 tablet daily for 5 days as needed         Follow-up Information     [name], MD Follow up in 1 week(s).   Specialty: Family Medicine Contact information: [address] [place] [address] [phone]         Lujean Amel D, MD Follow up in 1 week(s).   Specialties: Cardiology, Internal Medicine Contact information: [place] [address] [phone]                 Allergies  Allergen Reactions   Doxycycline Other (See Comments)    Emotional changes/anxious   Biaxin [Clarithromycin]     GI upset, and bad taste in mouth.     The results of significant diagnostics from this hospitalization (including imaging, microbiology,  ancillary and laboratory) are listed below for reference.   Consultations:   Procedures/Studies: DG Chest 2 View  Result Date: 10/25/2021 CLINICAL DATA:  Cough and shortness of breath for 2 days EXAM: CHEST - 2 VIEW COMPARISON:  10/19/2021 FINDINGS: Left-sided pleural effusion is again seen. Some improved aeration in the left base is noted when compared with the prior study. No other focal infiltrate is noted. Postsurgical changes are  seen. Stable cardiomegaly. No bony abnormality is noted. IMPRESSION: Left-sided pleural effusion and mild left basilar atelectasis but improved from the prior exam. Electronically Signed   By: Inez Catalina M.D.   On: 10/25/2021 21:02   DG Chest 2 View  Result Date: 10/19/2021 CLINICAL DATA:  Status post rib valve replacement. EXAM: CHEST - 2 VIEW COMPARISON:  09/24/2021 FINDINGS: Cardiopericardial silhouette is at upper limits of normal for size. Status post AVR. Left base atelectasis with small left pleural effusion is similar to prior. Right pleural effusion seen previously has resolved in the interval. The visualized bony structures of the thorax are unremarkable. IMPRESSION: 1. Interval resolution of right pleural effusion. 2. Persistent small left pleural effusion and left base atelectasis. Electronically Signed   By: Misty Stanley M.D.   On: 10/19/2021 12:37   CT Angio Chest Pulmonary Embolism (PE) W or WO Contrast  Result Date: 10/31/2021 CLINICAL DATA:  Hypoxia. EXAM: CT ANGIOGRAPHY CHEST WITH CONTRAST TECHNIQUE: Multidetector CT imaging of the chest was performed using the standard protocol during bolus administration of intravenous contrast. Multiplanar CT image reconstructions and MIPs were obtained to evaluate the vascular anatomy. RADIATION DOSE REDUCTION: This exam was performed according to the departmental dose-optimization program which includes automated exposure control, adjustment of the mA and/or kV according to patient size and/or use of iterative  reconstruction technique. CONTRAST:  80m OMNIPAQUE IOHEXOL 350 MG/ML SOLN COMPARISON:  06/23/2021 FINDINGS: Cardiovascular: Satisfactory opacification of the pulmonary arteries to the segmental level. No evidence of pulmonary embolism. Normal heart size. No pericardial effusion. Thoracic aortic atherosclerosis. Multi vessel coronary artery atherosclerosis. Prior CABG. Prior sternotomy with nonunion. Mediastinum/Nodes: Mediastinal lymphadenopathy with the largest subcarinal lymph node measuring 13 mm. Bilateral hilar lymphadenopathy with the largest right hilar lymph node measuring 14 mm and left hilar lymph node measuring 11 mm. No axillary lymphadenopathy. Thyroid gland, trachea, and esophagus demonstrate no significant findings. Lungs/Pleura: Bilateral extensive patchy airspace disease and ground-glass opacities with more confluent disease in the upper lobes bilaterally. Small bilateral pleural effusions. No pneumothorax. Upper Abdomen: No acute abnormality. Musculoskeletal: No acute osseous abnormality. No aggressive osseous lesion. Review of the MIP images confirms the above findings. IMPRESSION: 1. No pulmonary embolism. 2. Bilateral extensive patchy airspace disease and ground-glass opacities with more confluent disease in the upper lobes bilaterally. Differential considerations include multifocal pneumonia versus ARDS versus pulmonary edema. 3. Mediastinal and bilateral hilar lymphadenopathy, likely reactive. 4. Aortic Atherosclerosis (ICD10-I70.0). Electronically Signed   By: HKathreen DevoidM.D.   On: 10/31/2021 12:06   DG Chest Port 1 View  Result Date: 11/01/2021 CLINICAL DATA:  Acute respiratory failure with hypoxia. EXAM: PORTABLE CHEST 1 VIEW COMPARISON:  Oct 31, 2021. FINDINGS: Stable cardiomediastinal silhouette. Status post aortic valve repair. Stable bilateral diffuse lung opacities are noted concerning for multifocal pneumonia. Bony thorax is unremarkable. IMPRESSION: Stable bilateral lung  opacities as described above. Electronically Signed   By: JMarijo ConceptionM.D.   On: 11/01/2021 08:21   DG Chest Port 1 View  Result Date: 10/31/2021 CLINICAL DATA:  Acute respiratory failure EXAM: PORTABLE CHEST 1 VIEW COMPARISON:  10/29/2021 FINDINGS: Diffuse bilateral airspace opacities, RIGHT greater than LEFT, are slightly increased. Cardiomediastinal silhouette is obscured. Cardiac valve replacement again noted. No pneumothorax or large pleural effusion. No other significant change identified. IMPRESSION: Slightly increased diffuse bilateral airspace opacities, RIGHT greater than LEFT. Electronically Signed   By: JMargarette CanadaM.D.   On: 10/31/2021 09:33   [facility]  Result  Date: 10/29/2021 CLINICAL DATA:  Fever, shortness of breath EXAM: PORTABLE CHEST 1 VIEW COMPARISON:  Previous studies including the examination of 10/27/2021 FINDINGS: Transverse diameter of heart is increased. There is previous placement of prosthetic cardiac valve. There are patchy alveolar densities in both lungs, more so on the right side. There is slight improvement in infiltrates in right parahilar region and worsening of infiltrates in the lateral right lower lung fields. Lateral CP angles are clear. There is no pneumothorax. Faint calcifications are seen in the chest wall, possibly breast implants. IMPRESSION: Extensive infiltrates are seen in both lungs, more so on the right side. There is decrease in infiltrate in the right parahilar region and possible worsening of infiltrate in the lateral right lower lung fields. Findings suggest multifocal pneumonia. Possibility of underlying asymmetric pulmonary edema is not excluded. Electronically Signed   By: Elmer Picker M.D.   On: 10/29/2021 10:53   DG Chest Port 1 View  Result Date: 10/27/2021 CLINICAL DATA:  71 year old female with shortness of breath. EXAM: PORTABLE CHEST 1 VIEW COMPARISON:  Portable chest 10/26/2021 and earlier. FINDINGS: Portable AP semi  upright view at 0725 hours. Stable lung volumes and mediastinal contours. Incidental calcified breast implants. Prior sternotomy and cardiac valve replacement. Visualized tracheal air column is within normal limits. No pneumothorax. Ongoing patchy and asymmetric bilateral pulmonary opacity, confluent in both the right upper and lower lungs now. Less veiling opacity at the left lung base although small left pleural effusion cannot be excluded. Pulmonary vascularity does not appear significantly changed over the recent series of exams. No right pleural effusion. No acute osseous abnormality identified. Right upper quadrant cholecystectomy clips. Negative visible bowel gas. IMPRESSION: Continued progression of right greater than left Patchy and confluent pulmonary opacity. This more resembles multifocal pneumonia than asymmetric pulmonary edema. Questionable small left pleural effusion. Electronically Signed   By: Genevie Ann M.D.   On: 10/27/2021 07:45   DG Chest Port 1 View  Result Date: 10/26/2021 CLINICAL DATA:  Shortness of breath EXAM: PORTABLE CHEST 1 VIEW COMPARISON:  Previous day FINDINGS: Stable enlargement of the heart. Probable tiny left pleural effusion. No large right pleural effusion. No pneumothorax. Worsening bilateral right-greater-than-left interstitial and hazy opacities. Median sternotomy wires. IMPRESSION: Worsening bilateral interstitial and hazy opacities, edema versus multifocal infection. Electronically Signed   By: Albin Felling M.D.   On: 10/26/2021 13:51   DG Chest Port 1 View  Result Date: 10/25/2021 CLINICAL DATA:  Possible sepsis, shortness of breath and fevers EXAM: PORTABLE CHEST 1 VIEW COMPARISON:  Film from earlier in the same day. FINDINGS: Cardiac shadow is enlarged but stable. Postsurgical changes are noted. Left-sided pleural effusion and mild bibasilar airspace opacities are noted likely related to multifocal infiltrate or atelectasis. No bony abnormality is seen.  IMPRESSION: Bibasilar airspace opacities and small left effusion. These of increased slightly in the interval from the prior exam. Electronically Signed   By: Inez Catalina M.D.   On: 10/25/2021 21:00   ECHOCARDIOGRAM COMPLETE  Result Date: 10/27/2021    ECHOCARDIOGRAM REPORT   Patient Name:   Katie Buckley Date of Exam: 10/27/2021 Medical Rec #:  [id number]         Height:       60.0 in Accession #:    [id number]        Weight:       143.0 lb Date of Birth:  12-28-1950          BSA:  1.619 m Patient Age:    45 years          BP:           165/63 mmHg Patient Gender: F                 HR:           82 bpm. Exam Location:  [facility] Procedure: 2D Echo, Color Doppler and Cardiac Doppler Indications:     I50.31 congestive heart failure-Acute Diastolic  History:         Patient has prior history of Echocardiogram examinations, most                  recent 09/19/2021. Prior CABG, Aortic Valve Disease; Risk                  Factors:HCL and Diabetes.  Sonographer:     Charmayne Sheer Referring Phys:  [id number] Claiborne Billings A GRIFFITH Diagnosing Phys: Yolonda Kida MD  Sonographer Comments: Image acquisition challenging due to breast implants. IMPRESSIONS  1. Left ventricular ejection fraction, by estimation, is 55 to 60%. The left ventricle has normal function. The left ventricle demonstrates regional wall motion abnormalities (see scoring diagram/findings for description). Left ventricular diastolic parameters are consistent with Grade II diastolic dysfunction (pseudonormalization).  2. Right ventricular systolic function is normal. The right ventricular size is normal.  3. The mitral valve is normal in structure. Trivial mitral valve regurgitation.  4. The aortic valve is normal in structure. Aortic valve regurgitation is not visualized. FINDINGS  Left Ventricle: Left ventricular ejection fraction, by estimation, is 55 to 60%. The left ventricle has normal function. The left ventricle demonstrates regional wall motion  abnormalities. The left ventricular internal cavity size was normal in size. There is no left ventricular hypertrophy. Left ventricular diastolic parameters are consistent with Grade II diastolic dysfunction (pseudonormalization). Right Ventricle: The right ventricular size is normal. No increase in right ventricular wall thickness. Right ventricular systolic function is normal. Left Atrium: Left atrial size was normal in size. Right Atrium: Right atrial size was normal in size. Pericardium: There is no evidence of pericardial effusion. Mitral Valve: The mitral valve is normal in structure. Trivial mitral valve regurgitation. MV peak gradient, 13.2 mmHg. The mean mitral valve gradient is 6.0 mmHg. Tricuspid Valve: The tricuspid valve is normal in structure. Tricuspid valve regurgitation is trivial. Aortic Valve: The aortic valve is normal in structure. Aortic valve regurgitation is not visualized. Aortic valve mean gradient measures 14.0 mmHg. Aortic valve peak gradient measures 24.6 mmHg. Aortic valve area, by VTI measures 1.04 cm. Pulmonic Valve: The pulmonic valve was normal in structure. Pulmonic valve regurgitation is not visualized. Aorta: The ascending aorta was not well visualized. IAS/Shunts: No atrial level shunt detected by color flow Doppler.  LEFT VENTRICLE PLAX 2D LVIDd:         3.87 cm   Diastology LVIDs:         2.78 cm   LV e' medial:    3.26 cm/s LV PW:         1.13 cm   LV E/e' medial:  59.5 LV IVS:        0.90 cm   LV e' lateral:   6.74 cm/s LVOT diam:     1.40 cm   LV E/e' lateral: 28.8 LV SV:         45 LV SV Index:   28 LVOT Area:     1.54 cm  LEFT ATRIUM  Index LA diam:        3.70 cm 2.29 cm/m LA Vol (A2C):   40.7 ml 25.15 ml/m LA Vol (A4C):   57.4 ml 35.46 ml/m LA Biplane Vol: 49.9 ml 30.83 ml/m  AORTIC VALVE                     PULMONIC VALVE AV Area (Vmax):    0.85 cm      PV Vmax:       1.33 m/s AV Area (Vmean):   0.83 cm      PV Vmean:      88.800 cm/s AV Area (VTI):      1.04 cm      PV VTI:        0.234 m AV Vmax:           248.00 cm/s   PV Peak grad:  7.1 mmHg AV Vmean:          180.000 cm/s  PV Mean grad:  3.0 mmHg AV VTI:            0.433 m AV Peak Grad:      24.6 mmHg AV Mean Grad:      14.0 mmHg LVOT Vmax:         137.00 cm/s LVOT Vmean:        96.700 cm/s LVOT VTI:          0.293 m LVOT/AV VTI ratio: 0.68  AORTA Ao Root diam: 2.00 cm MITRAL VALVE MV Area (PHT): 3.03 cm     SHUNTS MV Area VTI:   1.02 cm     Systemic VTI:  0.29 m MV Peak grad:  13.2 mmHg    Systemic Diam: 1.40 cm MV Mean grad:  6.0 mmHg MV Vmax:       1.82 m/s MV Vmean:      114.0 cm/s MV Decel Time: 250 msec MV E velocity: 194.00 cm/s MV A velocity: 147.00 cm/s MV E/A ratio:  1.32 Dwayne D Callwood MD Electronically signed by Yolonda Kida MD Signature Date/Time: 10/27/2021/4:57:58 PM    Final       Labs: BNP (last 3 results) Recent Labs    10/25/21 2038 10/26/21 0327 10/31/21 0910  BNP 552.6* 506.5* 229.7*   Basic Metabolic Panel: Recent Labs  Lab 10/28/21 0634 10/29/21 0648 10/30/21 0946 10/31/21 0210 11/01/21 0728 11/01/21 1433 11/02/21 0732  NA 138 137 138 136 135  --  137  K 3.5 2.8* 3.4* 5.2* 4.1  --  3.5  CL 100 96* 97* 100 96*  --  99  CO2 '22 23 28 27 26  '$ --  28  GLUCOSE 147* 138* 210* 141* 267* 453* 97  BUN 25* 22 29* 30* 33*  --  29*  CREATININE 0.70 0.66 0.71 0.56 0.74  --  0.67  CALCIUM 8.7* 8.8* 8.9 8.8* 9.3  --  8.9  MG 2.1  --  2.4 2.3 2.6*  --   --   PHOS  --   --   --   --  4.1  --   --    Liver Function Tests: No results for input(s): AST, ALT, ALKPHOS, BILITOT, PROT, ALBUMIN in the last 168 hours. No results for input(s): LIPASE, AMYLASE in the last 168 hours. No results for input(s): AMMONIA in the last 168 hours. CBC: Recent Labs  Lab 10/29/21 0648 10/30/21 0946 10/31/21 0210 11/01/21 0728 11/02/21 0732  WBC 16.5* 15.2* 14.6* 19.5* 17.9*  HGB  10.7* 10.6* 9.8* 10.4* 10.3*  HCT 35.6* 34.9* 32.0* 33.5* 32.4*  MCV 79.3* 79.3* 78.6*  77.7* 77.0*  PLT 283 329 318 514* 496*   Cardiac Enzymes: No results for input(s): CKTOTAL, CKMB, CKMBINDEX, TROPONINI in the last 168 hours. BNP: Invalid input(s): POCBNP CBG: Recent Labs  Lab 10/31/21 2004 11/01/21 0827 11/01/21 1245 11/01/21 1637 11/02/21 0943  GLUCAP 273* 248* 421* 331* 110*   D-Dimer Recent Labs    10/31/21 0910  DDIMER 3.59*   Hgb A1c No results for input(s): HGBA1C in the last 72 hours. Lipid Profile No results for input(s): CHOL, HDL, LDLCALC, TRIG, CHOLHDL, LDLDIRECT in the last 72 hours. Thyroid function studies No results for input(s): TSH, T4TOTAL, T3FREE, THYROIDAB in the last 72 hours.  Invalid input(s): FREET3 Anemia work up No results for input(s): VITAMINB12, FOLATE, FERRITIN, TIBC, IRON, RETICCTPCT in the last 72 hours. Urinalysis    Component Value Date/Time   COLORURINE YELLOW (A) 10/26/2021 0543   APPEARANCEUR CLEAR (A) 10/26/2021 0543   LABSPEC 1.015 10/26/2021 0543   PHURINE 5.0 10/26/2021 0543   GLUCOSEU >=500 (A) 10/26/2021 0543   HGBUR NEGATIVE 10/26/2021 0543   BILIRUBINUR NEGATIVE 10/26/2021 0543   BILIRUBINUR negative 05/18/2021 1415   BILIRUBINUR Neg 05/28/2015 1621   KETONESUR 5 (A) 10/26/2021 0543   PROTEINUR NEGATIVE 10/26/2021 0543   UROBILINOGEN 0.2 05/18/2021 1415   NITRITE NEGATIVE 10/26/2021 0543   LEUKOCYTESUR NEGATIVE 10/26/2021 0543   Sepsis Labs Invalid input(s): PROCALCITONIN,  WBC,  LACTICIDVEN Microbiology Recent Results (from the past 240 hour(s))  Blood Culture (routine x 2)     Status: None   Collection Time: 10/25/21  8:38 PM   Specimen: BLOOD  Result Value Ref Range Status   Specimen Description BLOOD RAC  Final   Special Requests BOTTLES DRAWN AEROBIC AND ANAEROBIC BCLV  Final   Culture   Final    NO GROWTH 5 DAYS Performed at [facility], [address] [place], [place] [address]    Report Status 10/30/2021 FINAL  Final  Resp Panel by RT-PCR (Flu A&B, Covid)  Nasopharyngeal Swab     Status: None   Collection Time: 10/25/21  8:38 PM   Specimen: Nasopharyngeal Swab; Nasopharyngeal(NP) swabs in vial transport medium  Result Value Ref Range Status   SARS Coronavirus 2 by RT PCR NEGATIVE NEGATIVE Final    Comment: (NOTE) SARS-CoV-2 target nucleic acids are NOT DETECTED.  The SARS-CoV-2 RNA is generally detectable in upper respiratory specimens during the acute phase of infection. The lowest concentration of SARS-CoV-2 viral copies this assay can detect is 138 copies/mL. A negative result does not preclude SARS-Cov-2 infection and should not be used as the sole basis for treatment or other patient management decisions. A negative result may occur with  improper specimen collection/handling, submission of specimen other than nasopharyngeal swab, presence of viral mutation(s) within the areas targeted by this assay, and inadequate number of viral copies(<138 copies/mL). A negative result must be combined with clinical observations, patient history, and epidemiological information. The expected result is Negative.  Fact Sheet for Patients:  EntrepreneurPulse.com.au  Fact Sheet for Healthcare Providers:  IncredibleEmployment.be  This test is no t yet approved or cleared by the [place] FDA and  has been authorized for detection and/or diagnosis of SARS-CoV-2 by FDA under an Emergency Use Authorization (EUA). This EUA will remain  in effect (meaning this test can be used) for the duration of the COVID-19 declaration under Section 564(b)(1) of the Act, 21  U.S.C.section 360bbb-3(b)(1), unless the authorization is terminated  or revoked sooner.       Influenza A by PCR NEGATIVE NEGATIVE Final   Influenza B by PCR NEGATIVE NEGATIVE Final    Comment: (NOTE) The Xpert Xpress SARS-CoV-2/FLU/RSV plus assay is intended as an aid in the diagnosis of influenza from Nasopharyngeal swab specimens and should not be  used as a sole basis for treatment. Nasal washings and aspirates are unacceptable for Xpert Xpress SARS-CoV-2/FLU/RSV testing.  Fact Sheet for Patients: EntrepreneurPulse.com.au  Fact Sheet for Healthcare Providers: IncredibleEmployment.be  This test is not yet approved or cleared by the [place] FDA and has been authorized for detection and/or diagnosis of SARS-CoV-2 by FDA under an Emergency Use Authorization (EUA). This EUA will remain in effect (meaning this test can be used) for the duration of the COVID-19 declaration under Section 564(b)(1) of the Act, 21 U.S.C. section 360bbb-3(b)(1), unless the authorization is terminated or revoked.  Performed at [facility], [address] [place], [place] [address]   Culture, blood (Routine X 2) w Reflex to ID Panel     Status: None   Collection Time: 10/26/21  3:27 AM   Specimen: BLOOD  Result Value Ref Range Status   Specimen Description BLOOD LEFT [facility]  Final   Special Requests   Final    BOTTLES DRAWN AEROBIC AND ANAEROBIC Blood Culture results may not be optimal due to an excessive volume of blood received in culture bottles   Culture   Final    NO GROWTH 5 DAYS Performed at [facility], [address] [place], [place] [address]    Report Status 10/31/2021 FINAL  Final  MRSA Next Gen by PCR, Nasal     Status: None   Collection Time: 10/26/21  3:41 AM   Specimen: Nasal Mucosa; Nasal Swab  Result Value Ref Range Status   MRSA by PCR Next Gen NOT DETECTED NOT DETECTED Final    Comment: (NOTE) The GeneXpert MRSA Assay (FDA approved for NASAL specimens only), is one component of a comprehensive MRSA colonization surveillance program. It is not intended to diagnose MRSA infection nor to guide or monitor treatment for MRSA infections. Test performance is not FDA approved in patients less than 42 years old. Performed at [facility], [address]  [place], [place] [address]   Urine Culture     Status: None   Collection Time: 10/26/21  5:42 AM   Specimen: In/Out Cath Urine  Result Value Ref Range Status   Specimen Description   Final    IN/OUT CATH URINE Performed at [facility], [address], [place], [place] [address]    Special Requests   Final    NONE Performed at [facility], [address] [place], [place] [address]    Culture   Final    NO GROWTH Performed at [facility], [place] [address] [place],  [address]    Report Status 10/27/2021 FINAL  Final  Respiratory (~20 pathogens) panel by PCR     Status: Abnormal   Collection Time: 10/26/21  6:00 PM  Result Value Ref Range Status   Adenovirus NOT DETECTED NOT DETECTED Final   Coronavirus 229E NOT DETECTED NOT DETECTED Final    Comment: (NOTE) The Coronavirus on the Respiratory Panel, DOES NOT test for the novel  Coronavirus (2019 nCoV)    Coronavirus HKU1 NOT DETECTED NOT DETECTED Final   Coronavirus NL63 NOT DETECTED NOT DETECTED Final  Coronavirus OC43 NOT DETECTED NOT DETECTED Final   Metapneumovirus NOT DETECTED NOT DETECTED Final   Rhinovirus / Enterovirus DETECTED (A) NOT DETECTED Final   Influenza A NOT DETECTED NOT DETECTED Final   Influenza B NOT DETECTED NOT DETECTED Final   Parainfluenza Virus 1 NOT DETECTED NOT DETECTED Final   Parainfluenza Virus 2 NOT DETECTED NOT DETECTED Final   Parainfluenza Virus 3 NOT DETECTED NOT DETECTED Final   Parainfluenza Virus 4 NOT DETECTED NOT DETECTED Final   Respiratory Syncytial Virus NOT DETECTED NOT DETECTED Final   Bordetella pertussis NOT DETECTED NOT DETECTED Final   Bordetella Parapertussis NOT DETECTED NOT DETECTED Final   Chlamydophila pneumoniae NOT DETECTED NOT DETECTED Final   Mycoplasma pneumoniae NOT DETECTED NOT DETECTED Final    Comment: Performed at [facility], [place] [address] [place], [place] [address]     Total time spend on discharging this patient,  including the last patient exam, discussing the hospital stay, instructions for ongoing care as it relates to all pertinent caregivers, as well as preparing the medical discharge records, prescriptions, and/or referrals as applicable, is 40 minutes.    Enzo Bi, MD  [facility] 11/02/2021, 10:51 AM

## 2021-11-03 ENCOUNTER — Telehealth: Payer: Self-pay

## 2021-11-03 NOTE — Telephone Encounter (Addendum)
Transition Care Management Unsuccessful Follow-up Telephone Call  Date of discharge and from where:  TCM DC [facility] 11-02-21 Dx: sepsis  Attempts:  1st Attempt  Reason for unsuccessful TCM follow-up call:  Left voice message  Transition Care Management Unsuccessful Follow-up Telephone Call  Date of discharge and from where:  TCM DC [facility] 11-02-21 Dx: sepsis  Attempts:  2nd Attempt  Reason for unsuccessful TCM follow-up call:  Left voice message  Transition Care Management Unsuccessful Follow-up Telephone Call  Date of discharge and from where:  TCM DC [facility] 11-02-21 Dx: sepsis  Attempts:  3rd Attempt  Reason for unsuccessful TCM follow-up call:  Left voice message

## 2021-11-04 LAB — BLOOD GAS, VENOUS
Acid-Base Excess: 0.1 mmol/L (ref 0.0–2.0)
Bicarbonate: 24.7 mmol/L (ref 20.0–28.0)
O2 Saturation: 43.1 %
Patient temperature: 37
pCO2, Ven: 39 mmHg — ABNORMAL LOW (ref 44–60)
pH, Ven: 7.41 (ref 7.25–7.43)

## 2021-11-07 ENCOUNTER — Other Ambulatory Visit: Payer: Self-pay | Admitting: Family Medicine

## 2021-11-07 DIAGNOSIS — E1140 Type 2 diabetes mellitus with diabetic neuropathy, unspecified: Secondary | ICD-10-CM

## 2021-11-08 DIAGNOSIS — S92334A Nondisplaced fracture of third metatarsal bone, right foot, initial encounter for closed fracture: Secondary | ICD-10-CM | POA: Diagnosis not present

## 2021-11-09 ENCOUNTER — Ambulatory Visit (HOSPITAL_COMMUNITY): Payer: PPO | Attending: Cardiology

## 2021-11-09 NOTE — Telephone Encounter (Signed)
Pt returned call, requesting call back

## 2021-11-10 ENCOUNTER — Encounter: Payer: Self-pay | Admitting: *Deleted

## 2021-11-10 ENCOUNTER — Telehealth: Payer: Self-pay | Admitting: *Deleted

## 2021-11-10 ENCOUNTER — Encounter: Payer: PPO | Admitting: Physical Therapy

## 2021-11-10 DIAGNOSIS — Z951 Presence of aortocoronary bypass graft: Secondary | ICD-10-CM

## 2021-11-10 DIAGNOSIS — Z952 Presence of prosthetic heart valve: Secondary | ICD-10-CM

## 2021-11-10 NOTE — Telephone Encounter (Signed)
Katie Buckley called to let us know how she was doing since her hospitalization.  She is feeling better, but once she got home, she fell and broke her foot and is now wearing a boot.  They told her she would be in the boot for 4-6 weeks.  She see ortho again next week for follow up and was encouraged to ask that ,once cleared from cardiology to return to rehab, if she would be able to switch to a tennis shoe for class only and then back to boot.  She sees cardiology in follow from hospital on 6/14.  She will keep [place] posted on progress and would like to return to program once able.

## 2021-11-11 ENCOUNTER — Telehealth: Payer: Self-pay

## 2021-11-11 NOTE — Telephone Encounter (Signed)
Copied from [place] [id number]. Topic: Appointment Scheduling - Scheduling Inquiry for Clinic >> Nov 11, 2021  2:42 PM Valere Dross wrote: Reason for CRM: Pt called in stating she was recently seen in ED and was scheduled a Hospital f/u on 06/16, but hoping to get a sooner available appt, please advise.

## 2021-11-14 DIAGNOSIS — R002 Palpitations: Secondary | ICD-10-CM | POA: Diagnosis not present

## 2021-11-14 DIAGNOSIS — I350 Nonrheumatic aortic (valve) stenosis: Secondary | ICD-10-CM | POA: Diagnosis not present

## 2021-11-14 DIAGNOSIS — I739 Peripheral vascular disease, unspecified: Secondary | ICD-10-CM | POA: Diagnosis not present

## 2021-11-14 DIAGNOSIS — E1165 Type 2 diabetes mellitus with hyperglycemia: Secondary | ICD-10-CM | POA: Diagnosis not present

## 2021-11-14 DIAGNOSIS — R011 Cardiac murmur, unspecified: Secondary | ICD-10-CM | POA: Diagnosis not present

## 2021-11-14 DIAGNOSIS — Z952 Presence of prosthetic heart valve: Secondary | ICD-10-CM | POA: Diagnosis not present

## 2021-11-14 DIAGNOSIS — I2510 Atherosclerotic heart disease of native coronary artery without angina pectoris: Secondary | ICD-10-CM | POA: Diagnosis not present

## 2021-11-14 DIAGNOSIS — J189 Pneumonia, unspecified organism: Secondary | ICD-10-CM | POA: Diagnosis not present

## 2021-11-14 DIAGNOSIS — I25118 Atherosclerotic heart disease of native coronary artery with other forms of angina pectoris: Secondary | ICD-10-CM | POA: Diagnosis not present

## 2021-11-14 DIAGNOSIS — E669 Obesity, unspecified: Secondary | ICD-10-CM | POA: Diagnosis not present

## 2021-11-14 DIAGNOSIS — E782 Mixed hyperlipidemia: Secondary | ICD-10-CM | POA: Diagnosis not present

## 2021-11-14 NOTE — Progress Notes (Unsigned)
I,Katie Buckley,acting as a scribe for Katie Huh, MD.,have documented all relevant documentation on the behalf of Katie Huh, MD,as directed by  Katie Huh, MD while in the presence of Katie Huh, MD.  Established patient visit   Patient: Katie Buckley   DOB: 1950-09-16   71 y.o. Female  MRN: [id number] Visit Date: 11/15/2021  Today's healthcare provider: Lelon Huh, MD   No chief complaint on file.  Subjective    Follow up Hospitalization  Patient was admitted to ED on 5/16/23and discharged on 11/02/21. She was treated for Shortness of Breath / hypoxia, rhinovirus with secondary presumed bacterial multifocal pneumonia and sepsis. She was initially treated with multiple IV antibiotic and solumedrol and discharged with prescriptions for levofloxacin and solumedrol. She did have some issues with fluid overload and concern for CHF, but responded well to diuresis, had echo with preserved EF and grade 2 diastolic dysfunctions.   Telephone follow up was done on 11/03/21. She had follow up with her cardiologist yesterday and reports no changes were made.  She reports good compliance with treatment. She reports this condition is much improved. Still having some cough and chest congestion, but no dyspnea.   ----------------------------------------------------------------------------------------- -   Medications: Outpatient Medications Prior to Visit  Medication Sig   acetaminophen (TYLENOL) 650 MG CR tablet Take 1,300 mg by mouth every 8 (eight) hours as needed for pain (pain).   amiodarone (PACERONE) 200 MG tablet Take 1 tablet (200 mg total) by mouth daily. Home med.   amLODipine (NORVASC) 10 MG tablet Take 1 tablet (10 mg total) by mouth daily.   apixaban (ELIQUIS) 5 MG TABS tablet Take 1 tablet (5 mg total) by mouth 2 (two) times daily.   aspirin EC 81 MG tablet Take 1 tablet (81 mg total) by mouth daily. Swallow whole.   buPROPion (WELLBUTRIN SR) 150 MG 12 hr  tablet Take 150 mg by mouth 2 (two) times daily.   butalbital-acetaminophen-caffeine (FIORICET) 50-325-40 MG tablet TAKE 1 TABLET BY MOUTH EVERY 6 HOURS AS NEEDED FOR HEADACHE (Patient taking differently: Take 1 tablet by mouth every 6 (six) hours as needed for headache.)   ezetimibe (ZETIA) 10 MG tablet TAKE 1 TABLET BY MOUTH EVERYDAY AT BEDTIME (Patient taking differently: Take 10 mg by mouth at bedtime.)   fexofenadine (ALLEGRA) 180 MG tablet Take 180 mg by mouth daily as needed for allergies or rhinitis.   glipiZIDE (GLUCOTROL) 10 MG tablet TAKE 1 TABLET BY MOUTH EVERY DAY BEFORE BREAKFAST (Patient taking differently: Take 10 mg by mouth daily before breakfast.)   hydrOXYzine (VISTARIL) 25 MG capsule Take 25 mg by mouth daily as needed for anxiety.   irbesartan (AVAPRO) 75 MG tablet TAKE 1 TABLET BY MOUTH EVERY DAY   JARDIANCE 25 MG TABS tablet TAKE 1 TABLET (25 MG TOTAL) BY MOUTH DAILY. (Patient taking differently: 25 mg daily.)   Lancet Devices MISC    loperamide (IMODIUM) 2 MG capsule Take 2-4 mg by mouth daily as needed for diarrhea or loose stools.   metFORMIN (GLUCOPHAGE-XR) 500 MG 24 hr tablet TAKE 2 TABLETS BY MOUTH EVERY DAY   methocarbamol (ROBAXIN) 500 MG tablet Take 1 tablet (500 mg total) by mouth every 6 (six) hours as needed for muscle spasms.   metoprolol tartrate (LOPRESSOR) 50 MG tablet Take 1 tablet (50 mg total) by mouth 2 (two) times daily.   montelukast (SINGULAIR) 10 MG tablet Take 1 tablet (10 mg total) by mouth at bedtime as needed (allergies).  Home med.   Olopatadine HCl 0.7 % SOLN Place 1 drop into both eyes daily as needed (allergies).   ondansetron (ZOFRAN ODT) 4 MG disintegrating tablet Take 1 tablet (4 mg total) by mouth every 8 (eight) hours as needed for nausea or vomiting.   pregabalin (LYRICA) 100 MG capsule Take 100 mg by mouth 3 (three) times daily.   RABEprazole (ACIPHEX) 20 MG tablet Take 1 tablet (20 mg total) by mouth daily.   rosuvastatin (CRESTOR) 20  MG tablet TAKE 1 TABLET BY MOUTH EVERY DAY IN THE EVENING (Patient taking differently: Take 20 mg by mouth daily.)   sertraline (ZOLOFT) 100 MG tablet Take 200 mg by mouth daily.   simethicone (MYLICON) 80 MG chewable tablet Chew 80 mg by mouth every 6 (six) hours as needed for flatulence.   tirzepatide [facility]) 5 MG/0.5ML Pen Inject 5 mg into the skin once a week.   traZODone (DESYREL) 50 MG tablet Take 50-100 mg by mouth at bedtime as needed for sleep.    valACYclovir (VALTREX) 1000 MG tablet Take 1 tablet daily for 5 days as needed   No facility-administered medications prior to visit.         Objective    BP (!) 140/53 (BP Location: Right Arm, Patient Position: Sitting, Cuff Size: Normal)   Pulse (!) 55   Temp 97.8 F (36.6 C) (Oral)   Resp 16   Wt 132 lb 11.2 oz (60.2 kg)   SpO2 100%   BMI 25.92 kg/m    Physical Exam    General: Appearance:     Well developed, well nourished female in no acute distress  Eyes:    PERRL, conjunctiva/corneas clear, EOM's intact       Lungs:     Clear to auscultation bilaterally, respirations unlabored  Heart:    Bradycardic. Regular rhythm. No murmurs, rubs, or gallops.    MS:   All extremities are intact.    Neurologic:   Awake, alert, oriented x 3. No apparent focal neurological defect.         Assessment & Plan     1.  Multifocal pneumonia Apparently secondary bacterial pneumonia with sepsis which start as rhinovirus respiratory infection. Complicated hospital course with last chest xray done on 11/01/2021 prior to discharge showing stable bilateral lung opacities c/w multifocal pneumonia. She is greatly improved, but continues to have cough productive of yellow sputum.   Check  DG Chest 2 View; Future      The entirety of the information documented in the History of Present Illness, Review of Systems and Physical Exam were personally obtained by me. Portions of this information were initially documented by the CMA and reviewed  by me for thoroughness and accuracy.     Katie Huh, MD  [facility] [phone] (phone) [phone] (fax)  Katie Buckley

## 2021-11-14 NOTE — Telephone Encounter (Signed)
Appointment rescheduled to 11/15/2021 at 10am.

## 2021-11-15 ENCOUNTER — Ambulatory Visit
Admission: RE | Admit: 2021-11-15 | Discharge: 2021-11-15 | Disposition: A | Discharge status: Home / Self Care | Payer: PPO | Attending: Family Medicine | Admitting: Family Medicine

## 2021-11-15 ENCOUNTER — Encounter: Payer: Self-pay | Admitting: Family Medicine

## 2021-11-15 ENCOUNTER — Ambulatory Visit
Admission: RE | Admit: 2021-11-15 | Discharge: 2021-11-15 | Disposition: A | Discharge status: Home / Self Care | Payer: PPO | Source: Ambulatory Visit | Attending: Family Medicine | Admitting: Family Medicine

## 2021-11-15 ENCOUNTER — Ambulatory Visit (INDEPENDENT_AMBULATORY_CARE_PROVIDER_SITE_OTHER): Payer: PPO | Admitting: Family Medicine

## 2021-11-15 VITALS — BP 140/53 | HR 55 | Temp 97.8°F | Resp 16 | Wt 132.7 lb

## 2021-11-15 DIAGNOSIS — J189 Pneumonia, unspecified organism: Secondary | ICD-10-CM

## 2021-11-15 NOTE — Patient Instructions (Signed)
Please review the attached list of medications and notify my office if there are any errors.   Go to the [facility] on [address] for chest Xray  

## 2021-11-17 ENCOUNTER — Other Ambulatory Visit: Payer: Self-pay | Admitting: Family Medicine

## 2021-11-17 DIAGNOSIS — Z1231 Encounter for screening mammogram for malignant neoplasm of breast: Secondary | ICD-10-CM

## 2021-11-18 DIAGNOSIS — Z951 Presence of aortocoronary bypass graft: Secondary | ICD-10-CM | POA: Diagnosis not present

## 2021-11-18 DIAGNOSIS — Z6826 Body mass index (BMI) 26.0-26.9, adult: Secondary | ICD-10-CM | POA: Diagnosis not present

## 2021-11-18 DIAGNOSIS — Z7982 Long term (current) use of aspirin: Secondary | ICD-10-CM | POA: Diagnosis not present

## 2021-11-18 DIAGNOSIS — I110 Hypertensive heart disease with heart failure: Secondary | ICD-10-CM | POA: Diagnosis not present

## 2021-11-18 DIAGNOSIS — Z7984 Long term (current) use of oral hypoglycemic drugs: Secondary | ICD-10-CM | POA: Diagnosis not present

## 2021-11-18 DIAGNOSIS — I4819 Other persistent atrial fibrillation: Secondary | ICD-10-CM | POA: Diagnosis not present

## 2021-11-18 DIAGNOSIS — I5032 Chronic diastolic (congestive) heart failure: Secondary | ICD-10-CM | POA: Diagnosis not present

## 2021-11-18 DIAGNOSIS — I2510 Atherosclerotic heart disease of native coronary artery without angina pectoris: Secondary | ICD-10-CM | POA: Diagnosis not present

## 2021-11-18 DIAGNOSIS — E1165 Type 2 diabetes mellitus with hyperglycemia: Secondary | ICD-10-CM | POA: Diagnosis not present

## 2021-11-18 DIAGNOSIS — D692 Other nonthrombocytopenic purpura: Secondary | ICD-10-CM | POA: Diagnosis not present

## 2021-11-18 DIAGNOSIS — S92334A Nondisplaced fracture of third metatarsal bone, right foot, initial encounter for closed fracture: Secondary | ICD-10-CM | POA: Diagnosis not present

## 2021-11-19 ENCOUNTER — Other Ambulatory Visit: Payer: Self-pay | Admitting: Physician Assistant

## 2021-11-23 ENCOUNTER — Ambulatory Visit (INDEPENDENT_AMBULATORY_CARE_PROVIDER_SITE_OTHER): Payer: Self-pay | Admitting: Surgery

## 2021-11-23 ENCOUNTER — Encounter: Payer: Self-pay | Admitting: Surgery

## 2021-11-23 ENCOUNTER — Encounter: Payer: Self-pay | Admitting: *Deleted

## 2021-11-23 VITALS — BP 120/59 | HR 67 | Resp 20 | Ht 60.0 in | Wt 125.0 lb

## 2021-11-23 DIAGNOSIS — S8991XA Unspecified injury of right lower leg, initial encounter: Secondary | ICD-10-CM | POA: Diagnosis not present

## 2021-11-23 DIAGNOSIS — M25469 Effusion, unspecified knee: Secondary | ICD-10-CM | POA: Insufficient documentation

## 2021-11-23 DIAGNOSIS — Z952 Presence of prosthetic heart valve: Secondary | ICD-10-CM

## 2021-11-23 DIAGNOSIS — M25552 Pain in left hip: Secondary | ICD-10-CM | POA: Insufficient documentation

## 2021-11-23 DIAGNOSIS — Z951 Presence of aortocoronary bypass graft: Secondary | ICD-10-CM

## 2021-11-23 DIAGNOSIS — M7062 Trochanteric bursitis, left hip: Secondary | ICD-10-CM | POA: Diagnosis not present

## 2021-11-23 NOTE — Progress Notes (Signed)
Cardiac Individual Treatment Plan  Patient Details  Name: Katie Buckley MRN: [id number] Date of Birth: 11/25/1950 Referring Provider:   Flowsheet Row Cardiac Rehab from 10/20/2021 in [facility] Cardiac and Pulmonary Rehab  Referring Provider Lujean Amel MD       Initial Encounter Date:  Flowsheet Row Cardiac Rehab from 10/20/2021 in [facility] Cardiac and Pulmonary Rehab  Date 10/20/21       Visit Diagnosis: S/P CABG x 1  S/P aortic valve replacement  Patient's Home Medications on Admission:  Current Outpatient Medications:    acetaminophen (TYLENOL) 650 MG CR tablet, Take 1,300 mg by mouth every 8 (eight) hours as needed for pain (pain)., Disp: , Rfl:    amiodarone (PACERONE) 200 MG tablet, Take 1 tablet (200 mg total) by mouth daily. Home med., Disp: 60 tablet, Rfl: 1   amLODipine (NORVASC) 10 MG tablet, Take 1 tablet (10 mg total) by mouth daily., Disp: 30 tablet, Rfl: 2   apixaban (ELIQUIS) 5 MG TABS tablet, Take 1 tablet (5 mg total) by mouth 2 (two) times daily., Disp: 60 tablet, Rfl: 2   aspirin EC 81 MG tablet, Take 1 tablet (81 mg total) by mouth daily. Swallow whole., Disp: , Rfl:    buPROPion (WELLBUTRIN SR) 150 MG 12 hr tablet, Take 150 mg by mouth 2 (two) times daily., Disp: , Rfl:    butalbital-acetaminophen-caffeine (FIORICET) 50-325-40 MG tablet, TAKE 1 TABLET BY MOUTH EVERY 6 HOURS AS NEEDED FOR HEADACHE (Patient taking differently: Take 1 tablet by mouth every 6 (six) hours as needed for headache.), Disp: 30 tablet, Rfl: 4   ezetimibe (ZETIA) 10 MG tablet, TAKE 1 TABLET BY MOUTH EVERYDAY AT BEDTIME (Patient taking differently: Take 10 mg by mouth at bedtime.), Disp: 90 tablet, Rfl: 0   fexofenadine (ALLEGRA) 180 MG tablet, Take 180 mg by mouth daily as needed for allergies or rhinitis., Disp: , Rfl:    glipiZIDE (GLUCOTROL) 10 MG tablet, TAKE 1 TABLET BY MOUTH EVERY DAY BEFORE BREAKFAST (Patient taking differently: Take 10 mg by mouth daily before breakfast.), Disp: 90  tablet, Rfl: 4   hydrOXYzine (VISTARIL) 25 MG capsule, Take 25 mg by mouth daily as needed for anxiety., Disp: , Rfl:    irbesartan (AVAPRO) 75 MG tablet, TAKE 1 TABLET BY MOUTH EVERY DAY, Disp: 90 tablet, Rfl: 0   JARDIANCE 25 MG TABS tablet, TAKE 1 TABLET (25 MG TOTAL) BY MOUTH DAILY. (Patient taking differently: 25 mg daily.), Disp: 90 tablet, Rfl: 4   Lancet Devices MISC, , Disp: , Rfl:    loperamide (IMODIUM) 2 MG capsule, Take 2-4 mg by mouth daily as needed for diarrhea or loose stools., Disp: , Rfl:    metFORMIN (GLUCOPHAGE-XR) 500 MG 24 hr tablet, TAKE 2 TABLETS BY MOUTH EVERY DAY, Disp: 180 tablet, Rfl: 3   methocarbamol (ROBAXIN) 500 MG tablet, Take 1 tablet (500 mg total) by mouth every 6 (six) hours as needed for muscle spasms., Disp: 60 tablet, Rfl: 0   metoprolol tartrate (LOPRESSOR) 50 MG tablet, Take 1 tablet (50 mg total) by mouth 2 (two) times daily., Disp: 60 tablet, Rfl: 2   montelukast (SINGULAIR) 10 MG tablet, Take 1 tablet (10 mg total) by mouth at bedtime as needed (allergies). Home med., Disp: , Rfl:    Olopatadine HCl 0.7 % SOLN, Place 1 drop into both eyes daily as needed (allergies)., Disp: , Rfl:    ondansetron (ZOFRAN ODT) 4 MG disintegrating tablet, Take 1 tablet (4 mg total) by mouth every  8 (eight) hours as needed for nausea or vomiting., Disp: 20 tablet, Rfl: 0   pregabalin (LYRICA) 100 MG capsule, Take 100 mg by mouth 3 (three) times daily., Disp: , Rfl:    RABEprazole (ACIPHEX) 20 MG tablet, Take 1 tablet (20 mg total) by mouth daily., Disp: 30 tablet, Rfl: 3   rosuvastatin (CRESTOR) 20 MG tablet, TAKE 1 TABLET BY MOUTH EVERY DAY IN THE EVENING (Patient taking differently: Take 20 mg by mouth daily.), Disp: 90 tablet, Rfl: 4   sertraline (ZOLOFT) 100 MG tablet, Take 200 mg by mouth daily., Disp: , Rfl:    simethicone (MYLICON) 80 MG chewable tablet, Chew 80 mg by mouth every 6 (six) hours as needed for flatulence., Disp: , Rfl:    tirzepatide (MOUNJARO) 5  MG/0.5ML Pen, Inject 5 mg into the skin once a week., Disp: 2 mL, Rfl: 3   traZODone (DESYREL) 50 MG tablet, Take 50-100 mg by mouth at bedtime as needed for sleep. 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    Tobacco Use: Social History   Tobacco Use  Smoking Status Never  Smokeless Tobacco Never    Labs: Review Flowsheet  More data exists      Latest Ref Rng & Units 09/19/2021 09/21/2021 10/26/2021 10/27/2021  Labs for ITP Cardiac and Pulmonary Rehab  Cholestrol 0 - 200 mg/dL - 87  - -  LDL (calc) 0 - 99 mg/dL - 21  - -  HDL-C >40 mg/dL - 42  - -  Trlycerides <150 mg/dL - 122  - -  PH, Arterial 7.35 - 7.45 7.303  7.261  7.286  7.267  7.406  7.461  7.451  7.326  - 7.47  7.46   PCO2 arterial 32 - 48 mmHg 48.2  57.1  52.8  56.2  37.5  36.9  41.1  56.6  - 38  36   Bicarbonate 20.0 - 28.0 mmol/L 23.8  25.5  25.1  25.6  23.6  26.3  27.3  28.7  29.5  - 27.7  25.6   TCO2 22 - 32 mmol/L _0 - - -  Acid-base deficit 0.0 - 2.0 mmol/L 3.0  2.0  2.0  2.0  1.0  - - -  O2 Saturation % 97  99  99  98  94  100  71  100  100  - 92  91.8       10/29/2021  Labs for ITP Cardiac and Pulmonary Rehab  Cholestrol -  LDL (calc) -  HDL-C -  Trlycerides -  PH, Arterial -  PCO2 arterial -  Bicarbonate 24.7   TCO2 -  Acid-base deficit -  O2 Saturation 43.1  Exercise Target Goals: Exercise Program Goal: Individual exercise  prescription set using results from initial 6 min walk test and THRR while considering  patient's activity barriers and safety.   Exercise Prescription Goal: Initial exercise prescription builds to 30-45 minutes a day of aerobic activity, 2-3 days per week.  Home exercise guidelines will be given to patient during program as part of exercise prescription that the participant will acknowledge.   Education: Aerobic Exercise: - Group verbal and visual presentation on the components of exercise prescription. Introduces F.I.T.T principle from ACSM for exercise prescriptions.  Reviews F.I.T.T. principles of aerobic exercise including progression. Written material given at graduation. Flowsheet Row Cardiac Rehab from 10/20/2021 in [facility] Cardiac and Pulmonary Rehab  Education need identified 10/20/21       Education: Resistance Exercise: - Group verbal and visual presentation on the components of exercise prescription. Introduces F.I.T.T principle from ACSM for exercise prescriptions  Reviews F.I.T.T. principles of resistance exercise including progression. Written material given at graduation.    Education: Exercise & Equipment Safety: - Individual verbal instruction and demonstration of equipment use and safety with use of the equipment. Flowsheet Row Cardiac Rehab from 10/20/2021 in [facility] Cardiac and Pulmonary Rehab  Education need identified 10/20/21  Date 10/20/21  Educator [employer]  Instruction Review Code 1- Verbalizes Understanding       Education: Exercise Physiology & General Exercise Guidelines: - Group verbal and written instruction with models to review the exercise physiology of the cardiovascular system and associated critical values. Provides general exercise guidelines with specific guidelines to those with heart or lung disease.    Education: Flexibility, Balance, Mind/Body Relaxation: - Group verbal and visual presentation with interactive activity on the components of exercise  prescription. Introduces F.I.T.T principle from ACSM for exercise prescriptions. Reviews F.I.T.T. principles of flexibility and balance exercise training including progression. Also discusses the mind body connection.  Reviews various relaxation techniques to help reduce and manage stress (i.e. Deep breathing, progressive muscle relaxation, and visualization). Balance handout provided to take home. Written material given at graduation.   Activity Barriers & Risk Stratification:  Activity Barriers & Cardiac Risk Stratification - 10/20/21 1125       Activity Barriers & Cardiac Risk Stratification   Activity Barriers Balance Concerns;Deconditioning;Muscular Weakness;Other (comment);Incisional Pain    Comments Neuropathy    Cardiac Risk Stratification High             6 Minute Walk:  6 Minute Walk     Row Name 10/20/21 1126         6 Minute Walk   Phase Initial     Distance 830 feet     Walk Time 5 minutes     # of Rest Breaks 1  2:27-3:29     MPH 1.88     METS 1.63     RPE 15     Perceived Dyspnea  2     VO2 Peak 5.72     Symptoms Yes (comment)     Comments Bilateral leg weakness     Resting HR 57 bpm     Resting BP 130/58     Resting Oxygen Saturation  98 %     Exercise Oxygen Saturation  during 6 min walk 97 %     Max Ex. HR 65 bpm     Max Ex. BP 136/62     2 Minute Post BP 128/58              Oxygen Initial Assessment:  Oxygen Re-Evaluation:   Oxygen Discharge (Final Oxygen Re-Evaluation):   Initial Exercise Prescription:  Initial Exercise Prescription - 10/20/21 1100       Date of Initial Exercise RX and Referring Provider   Date 10/20/21    Referring Provider Lujean Amel MD      Oxygen   Maintain Oxygen Saturation 88% or higher      Recumbant Bike   Level 1    RPM 60    Watts 6    Minutes 15    METs 1.6      NuStep   Level 1    SPM 80    Minutes 15    METs 1.6      Biostep-RELP   Level 1    SPM 50    Minutes 15    METs  1.6      Track   Laps 10    Minutes 15    METs 1.54      Prescription Details   Frequency (times per week) 3    Duration Progress to 30 minutes of continuous aerobic without signs/symptoms of physical distress      Intensity   THRR 40-80% of Max Heartrate 94- 131    Ratings of Perceived Exertion 11-13    Perceived Dyspnea 0-4      Progression   Progression Continue to progress workloads to maintain intensity without signs/symptoms of physical distress.      Resistance Training   Training Prescription Yes    Weight 2 lb    Reps 10-15             Perform Capillary Blood Glucose checks as needed.  Exercise Prescription Changes:   Exercise Prescription Changes     Row Name 10/20/21 1100 10/26/21 1600           Response to Exercise   Blood Pressure (Admit) 130/58 122/64      Blood Pressure (Exercise) 136/62 122/82      Blood Pressure (Exit) 128/58 130/54      Heart Rate (Admit) 57 bpm 61 bpm      Heart Rate (Exercise) 65 bpm 72 bpm      Heart Rate (Exit) 58 bpm 60 bpm      Oxygen Saturation (Admit) 98 % --      Oxygen Saturation (Exercise) 97 % --      Rating of Perceived Exertion (Exercise) 15 13      Perceived Dyspnea (Exercise) 2 --      Symptoms Bilateral leg weakness none      Comments walk test results 1st full day of exercise      Duration -- Progress to 30 minutes of  aerobic without signs/symptoms of physical distress      Intensity -- THRR unchanged        Progression   Progression -- Continue to progress workloads to maintain intensity without signs/symptoms of physical distress.      Average METs -- 1.99        Resistance Training   Training Prescription -- Yes      Weight -- 2 lb      Reps -- 10-15        Interval Training   Interval Training -- No        Recumbant Bike   Level -- 1      Minutes -- 15      METs -- 2.39        NuStep   Level -- 2  Minutes -- 15      METs -- 1.6               Exercise Comments:    Exercise Comments     Row Name 10/24/21 1016           Exercise Comments First full day of exercise!  Patient was oriented to gym and equipment including functions, settings, policies, and procedures.  Patient's individual exercise prescription and treatment plan were reviewed.  All starting workloads were established based on the results of the 6 minute walk test done at initial orientation visit.  The plan for exercise progression was also introduced and progression will be customized based on patient's performance and goals.                Exercise Goals and Review:   Exercise Goals     Row Name 10/20/21 1134             Exercise Goals   Increase Physical Activity Yes       Intervention Provide advice, education, support and counseling about physical activity/exercise needs.;Develop an individualized exercise prescription for aerobic and resistive training based on initial evaluation findings, risk stratification, comorbidities and participant's personal goals.       Expected Outcomes Short Term: Attend rehab on a regular basis to increase amount of physical activity.;Long Term: Add in home exercise to make exercise part of routine and to increase amount of physical activity.;Long Term: Exercising regularly at least 3-5 days a week.       Increase Strength and Stamina Yes       Intervention Provide advice, education, support and counseling about physical activity/exercise needs.;Develop an individualized exercise prescription for aerobic and resistive training based on initial evaluation findings, risk stratification, comorbidities and participant's personal goals.       Expected Outcomes Short Term: Increase workloads from initial exercise prescription for resistance, speed, and METs.;Short Term: Perform resistance training exercises routinely during rehab and add in resistance training at home;Long Term: Improve cardiorespiratory fitness, muscular endurance and strength as  measured by increased METs and functional capacity (6MWT)       Able to understand and use rate of perceived exertion (RPE) scale Yes       Intervention Provide education and explanation on how to use RPE scale       Expected Outcomes Short Term: Able to use RPE daily in rehab to express subjective intensity level;Long Term:  Able to use RPE to guide intensity level when exercising independently       Able to understand and use Dyspnea scale Yes       Intervention Provide education and explanation on how to use Dyspnea scale       Expected Outcomes Short Term: Able to use Dyspnea scale daily in rehab to express subjective sense of shortness of breath during exertion;Long Term: Able to use Dyspnea scale to guide intensity level when exercising independently       Knowledge and understanding of Target Heart Rate Range (THRR) Yes       Intervention Provide education and explanation of THRR including how the numbers were predicted and where they are located for reference       Expected Outcomes Short Term: Able to state/look up THRR;Long Term: Able to use THRR to govern intensity when exercising independently;Short Term: Able to use daily as guideline for intensity in rehab       Able to check pulse independently Yes  Intervention Provide education and demonstration on how to check pulse in carotid and radial arteries.;Review the importance of being able to check your own pulse for safety during independent exercise       Expected Outcomes Short Term: Able to explain why pulse checking is important during independent exercise;Long Term: Able to check pulse independently and accurately       Understanding of Exercise Prescription Yes       Intervention Provide education, explanation, and written materials on patient's individual exercise prescription       Expected Outcomes Short Term: Able to explain program exercise prescription;Long Term: Able to explain home exercise prescription to exercise  independently                Exercise Goals Re-Evaluation :  Exercise Goals Re-Evaluation     Row Name 10/24/21 1017 10/26/21 1612           Exercise Goal Re-Evaluation   Exercise Goals Review Increase Strength and Stamina;Able to check pulse independently;Knowledge and understanding of Target Heart Rate Range (THRR);Understanding of Exercise Prescription;Able to understand and use rate of perceived exertion (RPE) scale Increase Physical Activity;Increase Strength and Stamina;Understanding of Exercise Prescription      Comments Reviewed RPE and dyspnea scales, THR and program prescription with pt today.  Pt voiced understanding and was given a copy of goals to take home. [name] is off to a good start with her first session of exercise completed at rehab. She was able to tolerate her exercise prescription and RPEs were in appropriate range. Staff will continue to monitor as she progresses in the program.      Expected Outcomes Short: Use RPE daily to regulate intensity. Long: Follow program prescription in THR. Short: Follow current exercise prescription and increase load when appropriate Long: Increase overall MET level               Discharge Exercise Prescription (Final Exercise Prescription Changes):  Exercise Prescription Changes - 10/26/21 1600       Response to Exercise   Blood Pressure (Admit) 122/64    Blood Pressure (Exercise) 122/82    Blood Pressure (Exit) 130/54    Heart Rate (Admit) 61 bpm    Heart Rate (Exercise) 72 bpm    Heart Rate (Exit) 60 bpm    Rating of Perceived Exertion (Exercise) 13    Symptoms none    Comments 1st full day of exercise    Duration Progress to 30 minutes of  aerobic without signs/symptoms of physical distress    Intensity THRR unchanged      Progression   Progression Continue to progress workloads to maintain intensity without signs/symptoms of physical distress.    Average METs 1.99      Resistance Training   Training  Prescription Yes    Weight 2 lb    Reps 10-15      Interval Training   Interval Training No      Recumbant Bike   Level 1    Minutes 15    METs 2.39      NuStep   Level 2    Minutes 15    METs 1.6             Nutrition:  Target Goals: Understanding of nutrition guidelines, daily intake of sodium <1535m, cholesterol <2044m calories 30% from fat and 7% or less from saturated fats, daily to have 5 or more servings of fruits and vegetables.  Education: All About Nutrition: -Group instruction provided by  verbal, written material, interactive activities, discussions, models, and posters to present general guidelines for heart healthy nutrition including fat, fiber, MyPlate, the role of sodium in heart healthy nutrition, utilization of the nutrition label, and utilization of this knowledge for meal planning. Follow up email sent as well. Written material given at graduation. Flowsheet Row Cardiac Rehab from 10/20/2021 in [facility] Cardiac and Pulmonary Rehab  Education need identified 10/20/21       Biometrics:  Pre Biometrics - 10/20/21 1125       Pre Biometrics   Height 5' 0.5" (1.537 m)    Weight 142 lb 12.8 oz (64.8 kg)    BMI (Calculated) 27.42    Single Leg Stand 1.3 seconds              Nutrition Therapy Plan and Nutrition Goals:  Nutrition Therapy & Goals - 10/20/21 1121       Intervention Plan   Intervention Prescribe, educate and counsel regarding individualized specific dietary modifications aiming towards targeted core components such as weight, hypertension, lipid management, diabetes, heart failure and other comorbidities.    Expected Outcomes Short Term Goal: Understand basic principles of dietary content, such as calories, fat, sodium, cholesterol and nutrients.;Short Term Goal: A plan has been developed with personal nutrition goals set during dietitian appointment.;Long Term Goal: Adherence to prescribed nutrition plan.             Nutrition  Assessments:  MEDIFICTS Score Key: ?70 Need to make dietary changes  40-70 Heart Healthy Diet ? 40 Therapeutic Level Cholesterol Diet  Flowsheet Row Cardiac Rehab from 10/20/2021 in [facility] Cardiac and Pulmonary Rehab  Picture Your Plate Total Score on Admission 45      Picture Your Plate Scores: <03 Unhealthy dietary pattern with much room for improvement. 41-50 Dietary pattern unlikely to meet recommendations for good health and room for improvement. 51-60 More healthful dietary pattern, with some room for improvement.  >60 Healthy dietary pattern, although there may be some specific behaviors that could be improved.    Nutrition Goals Re-Evaluation:   Nutrition Goals Discharge (Final Nutrition Goals Re-Evaluation):   Psychosocial: Target Goals: Acknowledge presence or absence of significant depression and/or stress, maximize coping skills, provide positive support system. Participant is able to verbalize types and ability to use techniques and skills needed for reducing stress and depression.   Education: Stress, Anxiety, and Depression - Group verbal and visual presentation to define topics covered.  Reviews how body is impacted by stress, anxiety, and depression.  Also discusses healthy ways to reduce stress and to treat/manage anxiety and depression.  Written material given at graduation.   Education: Sleep Hygiene -Provides group verbal and written instruction about how sleep can affect your health.  Define sleep hygiene, discuss sleep cycles and impact of sleep habits. Review good sleep hygiene tips.    Initial Review & Psychosocial Screening:  Initial Psych Review & Screening - 10/14/21 1326       Initial Review   Current issues with Current Depression;Current Psychotropic Meds      Family Dynamics   Good Support System? Yes   husband, daughter close by.     Barriers   Psychosocial barriers to participate in program There are no identifiable barriers or  psychosocial needs.      Screening Interventions   Interventions Encouraged to exercise;To provide support and resources with identified psychosocial needs;Provide feedback about the scores to participant    Expected Outcomes Short Term goal: Utilizing psychosocial counselor, staff and physician  to assist with identification of specific Stressors or current issues interfering with healing process. Setting desired goal for each stressor or current issue identified.;Long Term Goal: Stressors or current issues are controlled or eliminated.;Short Term goal: Identification and review with participant of any Quality of Life or Depression concerns found by scoring the questionnaire.;Long Term goal: The participant improves quality of Life and PHQ9 Scores as seen by post scores and/or verbalization of changes             Quality of Life Scores:   Quality of Life - 10/20/21 1120       Quality of Life   Select Quality of Life      Quality of Life Scores   Health/Function Pre 16.46 %    Socioeconomic Pre 29 %    Psych/Spiritual Pre 26.64 %    Family Pre 30 %    GLOBAL Pre 22.7 %            Scores of 19 and below usually indicate a poorer quality of life in these areas.  A difference of  2-3 points is a clinically meaningful difference.  A difference of 2-3 points in the total score of the Quality of Life Index has been associated with significant improvement in overall quality of life, self-image, physical symptoms, and general health in studies assessing change in quality of life.  PHQ-9: Review Flowsheet  More data exists      10/20/2021 02/09/2021 10/27/2020 09/01/2020 06/17/2019  Depression screen PHQ 2/9  Decreased Interest 0 _0 0  Down, Depressed, Hopeless 0 _1 0  PHQ - 2 Score 0 _2 0  Altered sleeping 1 0 _3 Tired, decreased energy 1 0 1 2 0  Change in appetite 0 0 _4 Feeling bad or failure about yourself  0 0 1 1 0  Trouble concentrating 0 0 1 1 0  Moving slowly  or fidgety/restless 0 0 0 1 0  Suicidal thoughts 0 0 0 0 0  PHQ-9 Score _5 Difficult doing work/chores Not difficult at all Not difficult at all Somewhat difficult Somewhat difficult Not difficult at all   Interpretation of Total Score  Total Score Depression Severity:  1-4 = Minimal depression, 5-9 = Mild depression, 10-14 = Moderate depression, 15-19 = Moderately severe depression, 20-27 = Severe depression   Psychosocial Evaluation and Intervention:  Psychosocial Evaluation - 10/14/21 1334       Psychosocial Evaluation & Interventions   Interventions Encouraged to exercise with the program and follow exercise prescription    Comments [name] has no barriers to attending the program. She has support from her husband of 23 years and a daughter that lives near by. She lives with  her husband and they have a cat. She is ready to get started with the program. She continues to have some incisonal pain. She is being treated for a deep depression, and she states that is improving.   SHe is ready to attend the program.    Expected Outcomes [facility] attends all scheduled sessions. She sees improvement in her mood as she starts an exercis regimen.  LTG Apolonio Schneiders continues with her exercise regimen after discharge and continues to improve her depression    Continue Psychosocial Services  Follow up required by staff             Psychosocial Re-Evaluation:   Psychosocial Discharge (Final Psychosocial Re-Evaluation):   Vocational  Rehabilitation: Provide vocational rehab assistance to qualifying candidates.   Vocational Rehab Evaluation & Intervention:   Education: Education Goals: Education classes will be provided on a variety of topics geared toward better understanding of heart health and risk factor modification. Participant will state understanding/return demonstration of topics presented as noted by education test scores.  Learning Barriers/Preferences:   General  Cardiac Education Topics:  AED/CPR: - Group verbal and written instruction with the use of models to demonstrate the basic use of the AED with the basic ABC's of resuscitation.   Anatomy and Cardiac Procedures: - Group verbal and visual presentation and models provide information about basic cardiac anatomy and function. Reviews the testing methods done to diagnose heart disease and the outcomes of the test results. Describes the treatment choices: Medical Management, Angioplasty, or Coronary Bypass Surgery for treating various heart conditions including Myocardial Infarction, Angina, Valve Disease, and Cardiac Arrhythmias.  Written material given at graduation.   Medication Safety: - Group verbal and visual instruction to review commonly prescribed medications for heart and lung disease. Reviews the medication, class of the drug, and side effects. Includes the steps to properly store meds and maintain the prescription regimen.  Written material given at graduation.   Intimacy: - Group verbal instruction through game format to discuss how heart and lung disease can affect sexual intimacy. Written material given at graduation..   Know Your Numbers and Heart Failure: - Group verbal and visual instruction to discuss disease risk factors for cardiac and pulmonary disease and treatment options.  Reviews associated critical values for Overweight/Obesity, Hypertension, Cholesterol, and Diabetes.  Discusses basics of heart failure: signs/symptoms and treatments.  Introduces Heart Failure Zone chart for action plan for heart failure.  Written material given at graduation. Flowsheet Row Cardiac Rehab from 10/20/2021 in [facility] Cardiac and Pulmonary Rehab  Education need identified 10/20/21       Infection Prevention: - Provides verbal and written material to individual with discussion of infection control including proper hand washing and proper equipment cleaning during exercise session. Flowsheet Row  Cardiac Rehab from 10/20/2021 in [facility] Cardiac and Pulmonary Rehab  Education need identified 10/20/21  Date 10/20/21  Educator [place]  Instruction Review Code 1- Verbalizes Understanding       Falls Prevention: - Provides verbal and written material to individual with discussion of falls prevention and safety. Flowsheet Row Cardiac Rehab from 10/20/2021 in [facility]  Education need identified 10/20/21  Date 10/20/21  Educator [place]  Instruction Review Code 1- Verbalizes Understanding       Other: -Provides group and verbal instruction on various topics (see comments)   Knowledge Questionnaire Score:  Knowledge Questionnaire Score - 10/20/21 1118       Knowledge Questionnaire Score   Pre Score 19/26: Nutrition, Exercise, Angina             Core Components/Risk Factors/Patient Goals at Admission:  Personal Goals and Risk Factors at Admission - 10/20/21 1135       Core Components/Risk Factors/Patient Goals on Admission    Weight Management Yes;Weight Loss    Intervention Weight Management: Develop a combined nutrition and exercise program designed to reach desired caloric intake, while maintaining appropriate intake of nutrient and fiber, sodium and fats, and appropriate energy expenditure required for the weight goal.;Weight Management: Provide education and appropriate resources to help participant work on and attain dietary goals.;Weight Management/Obesity: Establish reasonable short term and long term weight goals.    Admit Weight 142 lb (64.4 kg)  Goal Weight: Short Term 138 lb (62.6 kg)    Goal Weight: Long Term 132 lb (59.9 kg)    Expected Outcomes Short Term: Continue to assess and modify interventions until short term weight is achieved;Long Term: Adherence to nutrition and physical activity/exercise program aimed toward attainment of established weight goal;Weight Loss: Understanding of general recommendations for a balanced deficit meal plan,  which promotes 1-2 lb weight loss per week and includes a negative energy balance of [phone] kcal/d;Understanding recommendations for meals to include 15-35% energy as protein, 25-35% energy from fat, 35-60% energy from carbohydrates, less than 233m of dietary cholesterol, 20-35 gm of total fiber daily;Understanding of distribution of calorie intake throughout the day with the consumption of 4-5 meals/snacks    Diabetes Yes    Intervention Provide education about signs/symptoms and action to take for hypo/hyperglycemia.;Provide education about proper nutrition, including hydration, and aerobic/resistive exercise prescription along with prescribed medications to achieve blood glucose in normal ranges: Fasting glucose 65-99 mg/dL    Expected Outcomes Short Term: Participant verbalizes understanding of the signs/symptoms and immediate care of hyper/hypoglycemia, proper foot care and importance of medication, aerobic/resistive exercise and nutrition plan for blood glucose control.;Long Term: Attainment of HbA1C < 7%.    Hypertension Yes    Intervention Provide education on lifestyle modifcations including regular physical activity/exercise, weight management, moderate sodium restriction and increased consumption of fresh fruit, vegetables, and low fat dairy, alcohol moderation, and smoking cessation.;Monitor prescription use compliance.    Expected Outcomes Short Term: Continued assessment and intervention until BP is < 140/923mHG in hypertensive participants. < 130/8080mG in hypertensive participants with diabetes, heart failure or chronic kidney disease.;Long Term: Maintenance of blood pressure at goal levels.    Lipids Yes    Intervention Provide education and support for participant on nutrition & aerobic/resistive exercise along with prescribed medications to achieve LDL <31m46mDL >40mg19m Expected Outcomes Short Term: Participant states understanding of desired cholesterol values and is compliant  with medications prescribed. Participant is following exercise prescription and nutrition guidelines.;Long Term: Cholesterol controlled with medications as prescribed, with individualized exercise RX and with personalized nutrition plan. Value goals: LDL < 31mg,19m > 40 mg.             Education:Diabetes - Individual verbal and written instruction to review signs/symptoms of diabetes, desired ranges of glucose level fasting, after meals and with exercise. Acknowledge that pre and post exercise glucose checks will be done for 3 sessions at entry of program. [place]  Education need identified 10/20/21  Date 10/20/21  Educator [place]  [place] Review Code 1- Verbalizes Understanding       Core Components/Risk Factors/Patient Goals Review:    Core Components/Risk Factors/Patient Goals at Discharge (Final Review):    ITP Comments:  ITP Comments     Row [place]05/05/23 1340 10/20/21 1117 10/24/21 1016 10/26/21 0911 11/10/21 1041   ITP Comments Virtual orientation call completed today. shehas an appointment on Date: 10/20/2021  for EP eval and gym Orientation.  [facility] . Completed 6MWT and gym orientation. Initial ITP created and sent for review to Dr. Mark MEmily Filbertcal Director. First full day of exercise!  Patient was oriented to gym and equipment including functions, settings, policies, and procedures.  Patient's individual exercise prescription and treatment plan were reviewed.  All starting workloads were established based on the results of the 6  minute walk test done at initial orientation visit.  The plan for exercise progression was also introduced and progression will be customized based on patient's performance and goals. 30 Day review completed. Medical Director ITP review done, changes made as directed, and signed approval by Medical Director.    NEW Ms.  Netzley called to let us know how she was doing since her hospitalization.  She is feeling better, but once she got home, she fell and broke her foot and is now wearing a boot.  They told her she would be in the boot for 4-6 weeks.  She see ortho again next week for follow up and was encouraged to ask that ,once cleared from cardiology to return to rehab, if she would be able to switch to a tennis shoe for class only and then back to boot.  She sees cardiology in follow from hospital on 6/14.  She will keep [place] posted on progress and would like to return to program once able.    [place] Name 11/23/21 1115           ITP Comments Ms. Zalar called today after her appointment and will still need to continue to wear her boot with her broken foot with an unknown time line.  We will discharge her at this time until her foot heals and will get a new referral to continue rehab after her foot is recovered.                Comments: Discharge ITP

## 2021-11-23 NOTE — Progress Notes (Signed)
Discharge Progress Report  Patient Details  Name: Katie Buckley MRN: [id number] Date of Birth: Mar 03, 1951 Referring Provider:   Flowsheet Row Cardiac Rehab from 10/20/2021 in [facility] Cardiac and Pulmonary Rehab  Referring Provider Lujean Amel MD        Number of Visits: 2  Reason for Discharge:  Early Exit:  Broken foot in a boot  Smoking History:  Social History   Tobacco Use  Smoking Status Never  Smokeless Tobacco Never    Diagnosis:  S/P CABG x 1  S/P aortic valve replacement  ADL UCSD:   Initial Exercise Prescription:  Initial Exercise Prescription - 10/20/21 1100       Date of Initial Exercise RX and Referring Provider   Date 10/20/21    Referring Provider Lujean Amel MD      Oxygen   Maintain Oxygen Saturation 88% or higher      Recumbant Bike   Level 1    RPM 60    Watts 6    Minutes 15    METs 1.6      NuStep   Level 1    SPM 80    Minutes 15    METs 1.6      Biostep-RELP   Level 1    SPM 50    Minutes 15    METs 1.6      Track   Laps 10    Minutes 15    METs 1.54      Prescription Details   Frequency (times per week) 3    Duration Progress to 30 minutes of continuous aerobic without signs/symptoms of physical distress      Intensity   THRR 40-80% of Max Heartrate 94- 131    Ratings of Perceived Exertion 11-13    Perceived Dyspnea 0-4      Progression   Progression Continue to progress workloads to maintain intensity without signs/symptoms of physical distress.      Resistance Training   Training Prescription Yes    Weight 2 lb    Reps 10-15             Discharge Exercise Prescription (Final Exercise Prescription Changes):  Exercise Prescription Changes - 10/26/21 1600       Response to Exercise   Blood Pressure (Admit) 122/64    Blood Pressure (Exercise) 122/82    Blood Pressure (Exit) 130/54    Heart Rate (Admit) 61 bpm    Heart Rate (Exercise) 72 bpm    Heart Rate (Exit) 60 bpm    Rating of  Perceived Exertion (Exercise) 13    Symptoms none    Comments 1st full day of exercise    Duration Progress to 30 minutes of  aerobic without signs/symptoms of physical distress    Intensity THRR unchanged      Progression   Progression Continue to progress workloads to maintain intensity without signs/symptoms of physical distress.    Average METs 1.99      Resistance Training   Training Prescription Yes    Weight 2 lb    Reps 10-15      Interval Training   Interval Training No      Recumbant Bike   Level 1    Minutes 15    METs 2.39      NuStep   Level 2    Minutes 15    METs 1.6             Functional Capacity:  6 Minute Walk  Katie Buckley Name 10/20/21 1126         6 Minute Walk   Phase Initial     Distance 830 feet     Walk Time 5 minutes     # of Rest Breaks 1  2:27-3:29     MPH 1.88     METS 1.63     RPE 15     Perceived Dyspnea  2     VO2 Peak 5.72     Symptoms Yes (comment)     Comments Bilateral leg weakness     Resting HR 57 bpm     Resting BP 130/58     Resting Oxygen Saturation  98 %     Exercise Oxygen Saturation  during 6 min walk 97 %     Max Ex. HR 65 bpm     Max Ex. BP 136/62     2 Minute Post BP 128/58              Psychological, QOL, Others - Outcomes: PHQ 2/9:    10/20/2021   11:18 AM 02/09/2021    9:36 AM 10/27/2020    9:52 AM 09/01/2020    8:35 AM 06/17/2019    3:01 PM  Depression screen PHQ 2/9  Decreased Interest 0 '1 1 1 '$ 0  Down, Depressed, Hopeless 0 '1 1 1 '$ 0  PHQ - 2 Score 0 '2 2 2 '$ 0  Altered sleeping 1 0 '1 1 1  '$ Tired, decreased energy 1 0 1 2 0  Change in appetite 0 0 '1 1 1  '$ Feeling bad or failure about yourself  0 0 1 1 0  Trouble concentrating 0 0 1 1 0  Moving slowly or fidgety/restless 0 0 0 1 0  Suicidal thoughts 0 0 0 0 0  PHQ-9 Score '2 2 7 9 2  '$ Difficult doing work/chores Not difficult at all Not difficult at all Somewhat difficult Somewhat difficult Not difficult at all    Quality of Life:  Quality of  Life - 10/20/21 1120       Quality of Life   Select Quality of Life      Quality of Life Scores   Health/Function Pre 16.46 %    Socioeconomic Pre 29 %    Psych/Spiritual Pre 26.64 %    Family Pre 30 %    GLOBAL Pre 22.7 %            Nutrition & Weight - Outcomes:  Pre Biometrics - 10/20/21 1125       Pre Biometrics   Height 5' 0.5" (1.537 m)    Weight 142 lb 12.8 oz (64.8 kg)    BMI (Calculated) 27.42    Single Leg Stand 1.3 seconds              Nutrition:  Nutrition Therapy & Goals - 10/20/21 1121       Intervention Plan   Intervention Prescribe, educate and counsel regarding individualized specific dietary modifications aiming towards targeted core components such as weight, hypertension, lipid management, diabetes, heart failure and other comorbidities.    Expected Outcomes Short Term Goal: Understand basic principles of dietary content, such as calories, fat, sodium, cholesterol and nutrients.;Short Term Goal: A plan has been developed with personal nutrition goals set during dietitian appointment.;Long Term Goal: Adherence to prescribed nutrition plan.             Nutrition Discharge:   Education Questionnaire Score:  Knowledge Questionnaire Score - 10/20/21 1118  Knowledge Questionnaire Score   Pre Score 19/26: Nutrition, Exercise, Angina             Goals reviewed with patient; copy given to patient.

## 2021-11-23 NOTE — Progress Notes (Signed)
HPI:  The patient returns today for follow-up status post coronary bypass graft surgery x1 using a left internal mammary to the LAD and aortic valve replacement using a 19 mm Inspiris pericardial valve on 09/19/2021.  She developed postoperative atrial fibrillation and was discharged home in sinus rhythm on amiodarone.  She did well for a while and then presented to [facility] on 10/25/2021 with altered mental status and was diagnosed with bilateral pneumonia on chest x-ray.  She recovered from that and was discharged on 11/02/2021.  Since returning home she has felt well but then she fell and broke her foot and is now walking around with the boot.  She denies any chest pain or shortness of breath.  She feels like her stamina is improving.  A follow-up echocardiogram on 10/27/2021 showed a left ventricular ejection fraction of 55 to 60%.  The mean gradient across the aortic valve prosthesis was 14 mmHg with a valve area by VTI of 1.04 cm.  Current Outpatient Medications  Medication Sig Dispense Refill   acetaminophen (TYLENOL) 650 MG CR tablet Take 1,300 mg by mouth every 8 (eight) hours as needed for pain (pain).     amiodarone (PACERONE) 200 MG tablet Take 1 tablet (200 mg total) by mouth daily. Home med. 60 tablet 1   amLODipine (NORVASC) 10 MG tablet Take 1 tablet (10 mg total) by mouth daily. 30 tablet 2   apixaban (ELIQUIS) 5 MG TABS tablet Take 1 tablet (5 mg total) by mouth 2 (two) times daily. 60 tablet 2   aspirin EC 81 MG tablet Take 1 tablet (81 mg total) by mouth daily. Swallow whole.     buPROPion (WELLBUTRIN SR) 150 MG 12 hr tablet Take 150 mg by mouth 2 (two) times daily.     butalbital-acetaminophen-caffeine (FIORICET) 50-325-40 MG tablet TAKE 1 TABLET BY MOUTH EVERY 6 HOURS AS NEEDED FOR HEADACHE (Patient taking differently: Take 1 tablet by mouth every 6 (six) hours as needed for headache.) 30 tablet 4   ezetimibe (ZETIA) 10 MG tablet TAKE 1 TABLET BY MOUTH EVERYDAY AT  BEDTIME (Patient taking differently: Take 10 mg by mouth at bedtime.) 90 tablet 0   fexofenadine (ALLEGRA) 180 MG tablet Take 180 mg by mouth daily as needed for allergies or rhinitis.     glipiZIDE (GLUCOTROL) 10 MG tablet TAKE 1 TABLET BY MOUTH EVERY DAY BEFORE BREAKFAST (Patient taking differently: Take 10 mg by mouth daily before breakfast.) 90 tablet 4   hydrOXYzine (VISTARIL) 25 MG capsule Take 25 mg by mouth daily as needed for anxiety.     irbesartan (AVAPRO) 75 MG tablet TAKE 1 TABLET BY MOUTH EVERY DAY 90 tablet 0   JARDIANCE 25 MG TABS tablet TAKE 1 TABLET (25 MG TOTAL) BY MOUTH DAILY. (Patient taking differently: 25 mg daily.) 90 tablet 4   Lancet Devices MISC      loperamide (IMODIUM) 2 MG capsule Take 2-4 mg by mouth daily as needed for diarrhea or loose stools.     metFORMIN (GLUCOPHAGE-XR) 500 MG 24 hr tablet TAKE 2 TABLETS BY MOUTH EVERY DAY 180 tablet 3   methocarbamol (ROBAXIN) 500 MG tablet Take 1 tablet (500 mg total) by mouth every 6 (six) hours as needed for muscle spasms. 60 tablet 0   metoprolol tartrate (LOPRESSOR) 50 MG tablet Take 1 tablet (50 mg total) by mouth 2 (two) times daily. 60 tablet 2   montelukast (SINGULAIR) 10 MG tablet Take 1 tablet (10 mg total) by mouth  at bedtime as needed (allergies). Home med.     Olopatadine HCl 0.7 % SOLN Place 1 drop into both eyes daily as needed (allergies).     ondansetron (ZOFRAN ODT) 4 MG disintegrating tablet Take 1 tablet (4 mg total) by mouth every 8 (eight) hours as needed for nausea or vomiting. 20 tablet 0   pregabalin (LYRICA) 100 MG capsule Take 100 mg by mouth 3 (three) times daily.     RABEprazole (ACIPHEX) 20 MG tablet Take 1 tablet (20 mg total) by mouth daily. 30 tablet 3   rosuvastatin (CRESTOR) 20 MG tablet TAKE 1 TABLET BY MOUTH EVERY DAY IN THE EVENING (Patient taking differently: Take 20 mg by mouth daily.) 90 tablet 4   sertraline (ZOLOFT) 100 MG tablet Take 200 mg by mouth daily.     simethicone (MYLICON) 80  MG chewable tablet Chew 80 mg by mouth every 6 (six) hours as needed for flatulence.     tirzepatide [facility]) 5 MG/0.5ML Pen Inject 5 mg into the skin once a week. 2 mL 3   traZODone (DESYREL) 50 MG tablet Take 50-100 mg by mouth at bedtime as needed for sleep.      valACYclovir (VALTREX) 1000 MG tablet Take 1 tablet daily for 5 days as needed 30 tablet 3   No current facility-administered medications for this visit.     Physical Exam: BP (!) 120/59 (BP Location: Right Arm, Patient Position: Sitting)   Pulse 67   Resp 20   Ht 5' (1.524 m)   Wt 125 lb (56.7 kg)   SpO2 93% Comment: RA  BMI 24.41 kg/m  She looks well. Cardiac exam shows a regular rate and rhythm with a 2/6 systolic flow murmur over the aortic valve.  There is no diastolic murmur. Lungs are clear. There is no peripheral edema. The chest incision is well-healed.  Sternum is stable.  Diagnostic Tests:  Narrative & Impression  CLINICAL DATA:  Follow-up pneumonia.   EXAM: CHEST - 2 VIEW   COMPARISON:  Chest x-ray 11/01/2021.   FINDINGS: Prior cardiac valve replacement. Borderline cardiomegaly, no pulmonary venous congestion. Low lung volumes. Interval near complete resolution of previously identified bilateral pulmonary infiltrates. No pleural effusion or pneumothorax. No acute bony abnormality. Degenerative changes scoliosis thoracic spine. Calcified breast implants. Surgical clips right upper quadrant.   IMPRESSION: Low lung volumes. Interval near complete resolution of previously identified bilateral pulmonary infiltrates.     Electronically Signed   By: Marcello Moores  Register M.D.   On: 11/16/2021 10:40      Impression:  Overall she is doing well 2 months following her surgery especially considering her subsequent bilateral pneumonia and broken foot.  I encouraged her to continue ambulating as much as she can.  She is going to have to delay cardiac rehab until her foot is better.  She appears to be  maintaining normal rhythm and is 2 months out from her heart surgery so I think the amiodarone can be discontinued.  She is also on Eliquis which I will continue until we are sure that she is maintaining normal rhythm.  Plan:  She will discontinue the amiodarone.  She is going to continue to follow-up with her PCP and Dr. Clayborn Bigness.  She will return to see me if she has any problems with her incisions.   Gaye Pollack, MD [facility] [phone]

## 2021-11-24 ENCOUNTER — Other Ambulatory Visit: Payer: Self-pay | Admitting: Orthopaedic Surgery

## 2021-11-24 ENCOUNTER — Telehealth: Payer: Self-pay | Admitting: Family Medicine

## 2021-11-24 DIAGNOSIS — S8991XA Unspecified injury of right lower leg, initial encounter: Secondary | ICD-10-CM

## 2021-11-24 NOTE — Telephone Encounter (Signed)
Copied from [place] [phone]. Topic: Medicare AWV >> Nov 24, 2021  3:04 PM Katie Buckley wrote: Reason for CRM:  Left message for patient to call back and schedule Medicare Annual Wellness Visit (AWV) in office.   If unable to come into the office for AWV,  please offer to do virtually or by telephone.  Last AWV: 11/05/2019  Please schedule at anytime with [facility] Health Advisor.  30 minute appointment for Virtual or phone 45 minute appointment for in office or Initial virtual/phone  Any questions, please contact me at [phone]

## 2021-11-25 ENCOUNTER — Inpatient Hospital Stay: Payer: PPO | Admitting: Family Medicine

## 2021-11-25 ENCOUNTER — Ambulatory Visit
Admission: RE | Admit: 2021-11-25 | Discharge: 2021-11-25 | Disposition: A | Discharge status: Home / Self Care | Payer: PPO | Source: Ambulatory Visit | Attending: Orthopaedic Surgery | Admitting: Orthopaedic Surgery

## 2021-11-25 DIAGNOSIS — S82831A Other fracture of upper and lower end of right fibula, initial encounter for closed fracture: Secondary | ICD-10-CM | POA: Diagnosis not present

## 2021-11-25 DIAGNOSIS — M25461 Effusion, right knee: Secondary | ICD-10-CM | POA: Diagnosis not present

## 2021-11-25 DIAGNOSIS — S8991XA Unspecified injury of right lower leg, initial encounter: Secondary | ICD-10-CM | POA: Insufficient documentation

## 2021-11-25 DIAGNOSIS — S82434A Nondisplaced oblique fracture of shaft of right fibula, initial encounter for closed fracture: Secondary | ICD-10-CM | POA: Diagnosis not present

## 2021-11-28 DIAGNOSIS — R41840 Attention and concentration deficit: Secondary | ICD-10-CM | POA: Diagnosis not present

## 2021-11-28 DIAGNOSIS — F411 Generalized anxiety disorder: Secondary | ICD-10-CM | POA: Diagnosis not present

## 2021-11-28 DIAGNOSIS — G4700 Insomnia, unspecified: Secondary | ICD-10-CM | POA: Diagnosis not present

## 2021-11-28 DIAGNOSIS — F331 Major depressive disorder, recurrent, moderate: Secondary | ICD-10-CM | POA: Diagnosis not present

## 2021-11-30 DIAGNOSIS — M25561 Pain in right knee: Secondary | ICD-10-CM | POA: Diagnosis not present

## 2021-12-19 ENCOUNTER — Other Ambulatory Visit: Payer: Self-pay | Admitting: Physician Assistant

## 2021-12-20 ENCOUNTER — Other Ambulatory Visit: Payer: Self-pay | Admitting: Family Medicine

## 2021-12-20 ENCOUNTER — Ambulatory Visit
Admission: RE | Admit: 2021-12-20 | Discharge: 2021-12-20 | Disposition: A | Discharge status: Home / Self Care | Payer: PPO | Source: Ambulatory Visit | Attending: Family Medicine | Admitting: Family Medicine

## 2021-12-20 DIAGNOSIS — Z1231 Encounter for screening mammogram for malignant neoplasm of breast: Secondary | ICD-10-CM

## 2021-12-24 ENCOUNTER — Other Ambulatory Visit: Payer: Self-pay | Admitting: Family Medicine

## 2021-12-28 DIAGNOSIS — H04123 Dry eye syndrome of bilateral lacrimal glands: Secondary | ICD-10-CM | POA: Diagnosis not present

## 2021-12-30 ENCOUNTER — Other Ambulatory Visit: Payer: Self-pay | Admitting: Physician Assistant

## 2022-01-02 ENCOUNTER — Ambulatory Visit: Payer: PPO | Admitting: Family Medicine

## 2022-01-02 DIAGNOSIS — M25561 Pain in right knee: Secondary | ICD-10-CM | POA: Diagnosis not present

## 2022-01-02 DIAGNOSIS — M79671 Pain in right foot: Secondary | ICD-10-CM | POA: Diagnosis not present

## 2022-01-04 ENCOUNTER — Other Ambulatory Visit: Payer: Self-pay | Admitting: Physician Assistant

## 2022-01-08 ENCOUNTER — Other Ambulatory Visit: Payer: Self-pay | Admitting: Physician Assistant

## 2022-01-25 ENCOUNTER — Ambulatory Visit: Payer: PPO | Admitting: Family Medicine

## 2022-01-25 NOTE — Progress Notes (Deleted)
Established patient visit   Patient: Katie Buckley   DOB: 1951/03/18   71 y.o. Female  MRN: [id number] Visit Date: 01/25/2022  Today's healthcare provider: Lelon Huh, MD   No chief complaint on file.  Subjective    HPI  Diabetes Mellitus Type II, Follow-up  Lab Results  Component Value Date   HGBA1C 8.0 (H) 09/15/2021   HGBA1C 9.2 (A) 03/25/2021   HGBA1C 11.3 (A) 02/09/2021   Wt Readings from Last 3 Encounters:  11/23/21 125 lb (56.7 kg)  11/15/21 132 lb 11.2 oz (60.2 kg)  11/02/21 133 lb 9.6 oz (60.6 kg)   Last seen for diabetes 4 months ago.  Management since then includes restarting tirzepatide [facility]) 5 MG/0.5ML Pen; Inject 5 mg into the skin once a week. She reports {excellent/good/fair/poor:19665} compliance with treatment. She {is/is not:21021397} having side effects. {document side effects if present:1} Symptoms: {Yes/No:20286} fatigue {Yes/No:20286} foot ulcerations  {Yes/No:20286} appetite changes {Yes/No:20286} nausea  {Yes/No:20286} paresthesia of the feet  {Yes/No:20286} polydipsia  {Yes/No:20286} polyuria {Yes/No:20286} visual disturbances   {Yes/No:20286} vomiting     Home blood sugar records: {diabetes glucometry results:16657}  Episodes of hypoglycemia? {Yes/No:20286} {enter symptoms and frequency of symptoms if yes:1}   Current insulin regiment: none Most Recent Eye Exam: not UTD {Current exercise:16438:::1} {Current diet habits:16563:::1}  Pertinent Labs: Lab Results  Component Value Date   CHOL 87 09/21/2021   HDL 42 09/21/2021   LDLCALC 21 09/21/2021   TRIG 122 09/21/2021   CHOLHDL 2.1 09/21/2021   Lab Results  Component Value Date   NA 137 11/02/2021   K 3.5 11/02/2021   CREATININE 0.67 11/02/2021   GFRNONAA >60 11/02/2021   LABMICR 11.0 09/30/2021     ---------------------------------------------------------------------------------------------------   Medications: Outpatient Medications Prior to Visit   Medication Sig   acetaminophen (TYLENOL) 650 MG CR tablet Take 1,300 mg by mouth every 8 (eight) hours as needed for pain (pain).   amiodarone (PACERONE) 200 MG tablet Take 1 tablet (200 mg total) by mouth daily. Home med.   amLODipine (NORVASC) 10 MG tablet Take 1 tablet (10 mg total) by mouth daily.   amLODipine (NORVASC) 10 MG tablet Take 1 tablet by mouth daily.   apixaban (ELIQUIS) 5 MG TABS tablet Take 1 tablet (5 mg total) by mouth 2 (two) times daily.   ARIPiprazole (ABILIFY) 5 MG tablet    Armodafinil 250 MG tablet    aspirin EC 325 MG tablet Take 1 tablet by mouth daily.   aspirin EC 81 MG tablet Take 1 tablet (81 mg total) by mouth daily. Swallow whole.   atorvastatin (LIPITOR) 80 MG tablet    baclofen (LIORESAL) 10 MG tablet TAKE 1 TABLET (10 MG TOTAL) BY MOUTH 2 (TWO) TIMES DAILY AS NEEDED FOR UP TO 30 DAYS   buPROPion (WELLBUTRIN SR) 150 MG 12 hr tablet Take 150 mg by mouth 2 (two) times daily.   buPROPion ER (WELLBUTRIN SR) 100 MG 12 hr tablet TAKE 1 TWICE DAILY AT 8AM AND 1PM   butalbital-acetaminophen-caffeine (FIORICET) 50-325-40 MG tablet TAKE 1 TABLET BY MOUTH EVERY 6 HOURS AS NEEDED FOR HEADACHE (Patient taking differently: Take 1 tablet by mouth every 6 (six) hours as needed for headache.)   empagliflozin (JARDIANCE) 10 MG TABS tablet Take 1 tablet by mouth daily.   ezetimibe (ZETIA) 10 MG tablet TAKE 1 TABLET BY MOUTH EVERYDAY AT BEDTIME (Patient taking differently: Take 10 mg by mouth at bedtime.)   fexofenadine (ALLEGRA) 180 MG tablet Take  180 mg by mouth daily as needed for allergies or rhinitis.   furosemide (LASIX) 40 MG tablet TAKE 1 TABLET (40 MG TOTAL) BY MOUTH DAILY. FOR 5 DAYS THEN STOP.   glipiZIDE (GLUCOTROL) 10 MG tablet TAKE 1 TABLET BY MOUTH EVERY DAY BEFORE BREAKFAST (Patient taking differently: Take 10 mg by mouth daily before breakfast.)   hydrOXYzine (ATARAX) 50 MG tablet TAKE 1 TWICE DAILY AS NEEDED   hydrOXYzine (VISTARIL) 25 MG capsule Take 25 mg by  mouth daily as needed for anxiety.   hydrOXYzine (VISTARIL) 50 MG capsule TAKE 1 CAPSULE BY MOUTH TWICE A DAY AS NEEDED   irbesartan (AVAPRO) 75 MG tablet TAKE 1 TABLET BY MOUTH EVERY DAY   JARDIANCE 25 MG TABS tablet TAKE 1 TABLET (25 MG TOTAL) BY MOUTH DAILY. (Patient taking differently: 25 mg daily.)   Lancet Devices MISC    levofloxacin (LEVAQUIN) 750 MG tablet TAKE 1 TABLET BY MOUTH DAILY FOR 4 DAYS.   loperamide (IMODIUM) 2 MG capsule Take 2-4 mg by mouth daily as needed for diarrhea or loose stools.   metFORMIN (GLUCOPHAGE-XR) 500 MG 24 hr tablet TAKE 2 TABLETS BY MOUTH EVERY DAY   metFORMIN (GLUCOPHAGE-XR) 500 MG 24 hr tablet Take 2 tablets by mouth daily.   methocarbamol (ROBAXIN) 500 MG tablet Take 1 tablet (500 mg total) by mouth every 6 (six) hours as needed for muscle spasms.   metoprolol tartrate (LOPRESSOR) 50 MG tablet Take 1 tablet (50 mg total) by mouth 2 (two) times daily.   metoprolol-hydrochlorothiazide (LOPRESSOR HCT) 100-25 MG tablet Take 0.5 tablets by mouth daily.   metroNIDAZOLE (FLAGYL) 500 MG tablet Take 1 tablet by mouth 3 (three) times daily.   montelukast (SINGULAIR) 10 MG tablet Take 1 tablet (10 mg total) by mouth at bedtime as needed (allergies). Home med.   montelukast (SINGULAIR) 10 MG tablet Take 1 tablet by mouth daily.   nortriptyline (PAMELOR) 10 MG capsule    Olopatadine HCl 0.7 % SOLN Place 1 drop into both eyes daily as needed (allergies).   ondansetron (ZOFRAN ODT) 4 MG disintegrating tablet Take 1 tablet (4 mg total) by mouth every 8 (eight) hours as needed for nausea or vomiting.   oxyCODONE (OXY IR/ROXICODONE) 5 MG immediate release tablet TAKE 1 TABLET BY MOUTH EVERY 6 (SIX) HOURS AS NEEDED FOR MODERATE PAIN ((SCORE 4 TO 6)).   oxyCODONE-acetaminophen (PERCOCET/ROXICET) 5-325 MG tablet TAKE 1 TABLET BY MOUTH EVERY 8 HOURS AS NEEDED FOR PAIN. TAKE AS NEEDED FOR PAIN MANAGEMENT.   pregabalin (LYRICA) 100 MG capsule Take 100 mg by mouth 3 (three) times  daily.   pregabalin (LYRICA) 100 MG capsule Take 1 capsule by mouth 3 (three) times daily.   promethazine (PHENERGAN) 25 MG tablet Take 25 mg by mouth every 8 (eight) hours as needed.   promethazine (PHENERGAN) 25 MG tablet Take 1 tablet by mouth every 8 (eight) hours as needed.   RABEprazole (ACIPHEX) 20 MG tablet Take 1 tablet (20 mg total) by mouth daily.   rosuvastatin (CRESTOR) 20 MG tablet TAKE 1 TABLET BY MOUTH EVERY DAY IN THE EVENING (Patient taking differently: Take 20 mg by mouth daily.)   senna (SENNA-TIME) 8.6 MG tablet Take 1 tablet by mouth 2 (two) times daily.   sertraline (ZOLOFT) 100 MG tablet Take 200 mg by mouth daily.   simethicone (MYLICON) 80 MG chewable tablet Chew 80 mg by mouth every 6 (six) hours as needed for flatulence.   sulfamethoxazole-trimethoprim (BACTRIM DS) 800-160 MG tablet Take  1 tablet every 12 hours by oral route for 7 days.   tirzepatide [facility]) 5 MG/0.5ML Pen Inject 5 mg into the skin once a week.   traMADol (ULTRAM) 50 MG tablet TAKE 1 TABLET BY MOUTH EVERY 8 HOURS FOR 5 DAYS   traZODone (DESYREL) 50 MG tablet Take 50-100 mg by mouth at bedtime as needed for sleep.    valACYclovir (VALTREX) 1000 MG tablet Take 1 tablet daily for 5 days as needed   No facility-administered medications prior to visit.    Review of Systems  {Labs  Heme  Chem  Endocrine  Serology  Results Review (optional):23779}   Objective    There were no vitals taken for this visit. {Show previous vital signs (optional):23777}  Physical Exam  ***  No results found for any visits on 01/25/22.  Assessment & Plan     ***  No follow-ups on file.      {provider attestation***:1}   Lelon Huh, MD  [facility] [phone] (phone) [phone] (fax)  [place]

## 2022-01-31 ENCOUNTER — Ambulatory Visit (INDEPENDENT_AMBULATORY_CARE_PROVIDER_SITE_OTHER): Payer: PPO

## 2022-01-31 VITALS — Wt 125.0 lb

## 2022-01-31 DIAGNOSIS — Z0000 Encounter for general adult medical examination without abnormal findings: Secondary | ICD-10-CM | POA: Diagnosis not present

## 2022-01-31 DIAGNOSIS — Z1211 Encounter for screening for malignant neoplasm of colon: Secondary | ICD-10-CM

## 2022-01-31 DIAGNOSIS — Z780 Asymptomatic menopausal state: Secondary | ICD-10-CM | POA: Diagnosis not present

## 2022-01-31 NOTE — Progress Notes (Signed)
Virtual Visit via Telephone Note  I connected with  Katie Buckley on 01/31/22 at  3:30 PM EDT by telephone and verified that I am speaking with the correct person using two identifiers.  Location: Patient: home Provider: [facility] Persons participating in the virtual visit: [place]   I discussed the limitations, risks, security and privacy concerns of performing an evaluation and management service by telephone and the availability of in person appointments. The patient expressed understanding and agreed to proceed.  Interactive audio and video telecommunications were attempted between this nurse and patient, however failed, due to patient having technical difficulties OR patient did not have access to video capability.  We continued and completed visit with audio only.  Some vital signs may be absent or patient reported.   Dionisio David, LPN  Subjective:   Katie Buckley is a 71 y.o. female who presents for Medicare Annual (Subsequent) preventive examination.  Review of Systems           Objective:    There were no vitals filed for this visit. There is no height or weight on file to calculate BMI.     10/26/2021    1:53 AM 10/14/2021    1:16 PM 09/22/2021   10:44 PM 09/15/2021   11:13 AM 05/24/2021   12:41 PM 05/19/2021    6:18 PM 01/12/2021    5:35 PM  Advanced Directives  Does Patient Have a Medical Advance Directive? No No No No No No No  Would patient like information on creating a medical advance directive? No - Patient declined Yes (MAU/Ambulatory/Procedural Areas - Information given) No - Patient declined No - Patient declined Yes (Inpatient - patient defers creating a medical advance directive at this time - Information given)  Yes (MAU/Ambulatory/Procedural Areas - Information given)    Current Medications (verified) Outpatient Encounter Medications as of 01/31/2022  Medication Sig   acetaminophen (TYLENOL) 650 MG CR tablet Take 1,300 mg by  mouth every 8 (eight) hours as needed for pain (pain).   apixaban (ELIQUIS) 5 MG TABS tablet Take 1 tablet (5 mg total) by mouth 2 (two) times daily.   aspirin EC 325 MG tablet Take 1 tablet by mouth daily.   aspirin EC 81 MG tablet Take 1 tablet (81 mg total) by mouth daily. Swallow whole.   atorvastatin (LIPITOR) 80 MG tablet    buPROPion (WELLBUTRIN SR) 150 MG 12 hr tablet Take 150 mg by mouth 2 (two) times daily.   buPROPion ER (WELLBUTRIN SR) 100 MG 12 hr tablet TAKE 1 TWICE DAILY AT 8AM AND 1PM   butalbital-acetaminophen-caffeine (FIORICET) 50-325-40 MG tablet TAKE 1 TABLET BY MOUTH EVERY 6 HOURS AS NEEDED FOR HEADACHE (Patient taking differently: Take 1 tablet by mouth every 6 (six) hours as needed for headache.)   empagliflozin (JARDIANCE) 10 MG TABS tablet Take 1 tablet by mouth daily.   ezetimibe (ZETIA) 10 MG tablet TAKE 1 TABLET BY MOUTH EVERYDAY AT BEDTIME (Patient taking differently: Take 10 mg by mouth at bedtime.)   fexofenadine (ALLEGRA) 180 MG tablet Take 180 mg by mouth daily as needed for allergies or rhinitis.   glipiZIDE (GLUCOTROL) 10 MG tablet TAKE 1 TABLET BY MOUTH EVERY DAY BEFORE BREAKFAST (Patient taking differently: Take 10 mg by mouth daily before breakfast.)   hydrOXYzine (ATARAX) 50 MG tablet TAKE 1 TWICE DAILY AS NEEDED   hydrOXYzine (VISTARIL) 25 MG capsule Take 25 mg by mouth daily as needed for anxiety.   hydrOXYzine (VISTARIL) 50  MG capsule TAKE 1 CAPSULE BY MOUTH TWICE A DAY AS NEEDED   JARDIANCE 25 MG TABS tablet TAKE 1 TABLET (25 MG TOTAL) BY MOUTH DAILY. (Patient taking differently: 25 mg daily.)   Lancet Devices MISC    loperamide (IMODIUM) 2 MG capsule Take 2-4 mg by mouth daily as needed for diarrhea or loose stools.   LYRICA 25 MG capsule    metFORMIN (GLUCOPHAGE-XR) 500 MG 24 hr tablet TAKE 2 TABLETS BY MOUTH EVERY DAY   metoprolol tartrate (LOPRESSOR) 50 MG tablet Take 1 tablet (50 mg total) by mouth 2 (two) times daily.    metoprolol-hydrochlorothiazide (LOPRESSOR HCT) 100-25 MG tablet Take 0.5 tablets by mouth daily.   montelukast (SINGULAIR) 10 MG tablet Take 1 tablet by mouth daily.   Olopatadine HCl 0.7 % SOLN Place 1 drop into both eyes daily as needed (allergies).   ondansetron (ZOFRAN ODT) 4 MG disintegrating tablet Take 1 tablet (4 mg total) by mouth every 8 (eight) hours as needed for nausea or vomiting.   pregabalin (LYRICA) 100 MG capsule Take 100 mg by mouth 3 (three) times daily.   pregabalin (LYRICA) 100 MG capsule Take 1 capsule by mouth 3 (three) times daily.   promethazine (PHENERGAN) 25 MG tablet Take 25 mg by mouth every 8 (eight) hours as needed.   promethazine (PHENERGAN) 25 MG tablet Take 1 tablet by mouth every 8 (eight) hours as needed.   RABEprazole (ACIPHEX) 20 MG tablet Take 1 tablet (20 mg total) by mouth daily.   rosuvastatin (CRESTOR) 20 MG tablet TAKE 1 TABLET BY MOUTH EVERY DAY IN THE EVENING (Patient taking differently: Take 20 mg by mouth daily.)   senna (SENNA-TIME) 8.6 MG tablet Take 1 tablet by mouth 2 (two) times daily.   sertraline (ZOLOFT) 100 MG tablet Take 200 mg by mouth daily.   simethicone (MYLICON) 80 MG chewable tablet Chew 80 mg by mouth every 6 (six) hours as needed for flatulence.   tirzepatide [facility]) 5 MG/0.5ML Pen Inject 5 mg into the skin once a week.   traMADol (ULTRAM) 50 MG tablet TAKE 1 TABLET BY MOUTH EVERY 8 HOURS FOR 5 DAYS   traZODone (DESYREL) 50 MG tablet Take 50-100 mg by mouth at bedtime as needed for sleep.    valACYclovir (VALTREX) 1000 MG tablet Take 1 tablet daily for 5 days as needed   [DISCONTINUED] metFORMIN (GLUCOPHAGE-XR) 500 MG 24 hr tablet Take 2 tablets by mouth daily.   amiodarone (PACERONE) 200 MG tablet Take 1 tablet (200 mg total) by mouth daily. Home med. (Patient not taking: Reported on 01/31/2022)   amLODipine (NORVASC) 10 MG tablet Take 1 tablet (10 mg total) by mouth daily. (Patient not taking: Reported on 01/31/2022)    amLODipine (NORVASC) 10 MG tablet Take 1 tablet by mouth daily. (Patient not taking: Reported on 01/31/2022)   amoxicillin-clavulanate (AUGMENTIN) 875-125 MG tablet Take 1 tablet by mouth 2 (two) times daily. (Patient not taking: Reported on 01/31/2022)   ARIPiprazole (ABILIFY) 5 MG tablet  (Patient not taking: Reported on 01/31/2022)   Armodafinil 250 MG tablet  (Patient not taking: Reported on 01/31/2022)   baclofen (LIORESAL) 10 MG tablet TAKE 1 TABLET (10 MG TOTAL) BY MOUTH 2 (TWO) TIMES DAILY AS NEEDED FOR UP TO 30 DAYS (Patient not taking: Reported on 01/31/2022)   furosemide (LASIX) 40 MG tablet TAKE 1 TABLET (40 MG TOTAL) BY MOUTH DAILY. FOR 5 DAYS THEN STOP. (Patient not taking: Reported on 01/31/2022)   irbesartan (AVAPRO) 75 MG  tablet TAKE 1 TABLET BY MOUTH EVERY DAY (Patient not taking: Reported on 01/31/2022)   levofloxacin (LEVAQUIN) 750 MG tablet TAKE 1 TABLET BY MOUTH DAILY FOR 4 DAYS. (Patient not taking: Reported on 01/31/2022)   methocarbamol (ROBAXIN) 500 MG tablet Take 1 tablet (500 mg total) by mouth every 6 (six) hours as needed for muscle spasms. (Patient not taking: Reported on 01/31/2022)   metroNIDAZOLE (FLAGYL) 500 MG tablet Take 1 tablet by mouth 3 (three) times daily. (Patient not taking: Reported on 01/31/2022)   montelukast (SINGULAIR) 10 MG tablet Take 1 tablet (10 mg total) by mouth at bedtime as needed (allergies). Home med.   nortriptyline (PAMELOR) 10 MG capsule  (Patient not taking: Reported on 01/31/2022)   oxyCODONE (OXY IR/ROXICODONE) 5 MG immediate release tablet TAKE 1 TABLET BY MOUTH EVERY 6 (SIX) HOURS AS NEEDED FOR MODERATE PAIN ((SCORE 4 TO 6)). (Patient not taking: Reported on 01/31/2022)   oxyCODONE-acetaminophen (PERCOCET/ROXICET) 5-325 MG tablet TAKE 1 TABLET BY MOUTH EVERY 8 HOURS AS NEEDED FOR PAIN. TAKE AS NEEDED FOR PAIN MANAGEMENT. (Patient not taking: Reported on 01/31/2022)   sulfamethoxazole-trimethoprim (BACTRIM DS) 800-160 MG tablet Take 1 tablet every 12  hours by oral route for 7 days. (Patient not taking: Reported on 01/31/2022)   No facility-administered encounter medications on file as of 01/31/2022.    Allergies (verified) Doxycycline and Biaxin [clarithromycin]   History: 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   Past Surgical History:  Procedure Laterality Date   ANTERIOR CERVICAL DECOMP/DISCECTOMY FUSION N/A 11/15/2020   Procedure: ANTERIOR CERVICAL DECOMPRESSION/DISCECTOMY FUSION 1 LEVEL C3/4;  Surgeon: Deetta Perla, MD;  Location: [facility];  Service: Neurosurgery;  Laterality: N/A;   AORTIC VALVE REPLACEMENT N/A 09/19/2021   Procedure: AORTIC VALVE REPLACEMENT Using 12m Edwards Resilia Inspiris Valve;  Surgeon: BGaye Pollack MD;  [place]  Service: Open Heart Surgery;  Laterality: N/A;   AUGMENTATION MAMMAPLASTY Bilateral    breast implants   Bone Spur  2007   foot   CARDIAC CATHETERIZATION     Carpal Tunnel Symdrome  2008   as repeated in 2009   CATARACT EXTRACTION W/PHACO Left 01/16/2019   Procedure: CATARACT EXTRACTION PHACO AND INTRAOCULAR LENS PLACEMENT ([place] LEFT DIABETES VISION BLUE;  Surgeon: HMarchia Meiers MD;  Location: [facility];  Service: Ophthalmology;  Laterality: Left;  [place] 00:47 CDE 6.62 Fluid pack lot # [id number]   CATARACT EXTRACTION W/PHACO Right 02/06/2019   Procedure: CATARACT EXTRACTION PHACO AND INTRAOCULAR LENS PLACEMENT (IOC);  Surgeon: HMarchia Meiers MD;  Location: [facility];  Service: Ophthalmology;  Laterality: Right;  [place]00:54.6 CDE 6.52 FLUID PACK LOT # [id number]   CHOLECYSTECTOMY  2007   COLONOSCOPY WITH PROPOFOL N/A  11/17/2015   Procedure: COLONOSCOPY WITH PROPOFOL;  Surgeon: RManya Silvas MD;  [facility]  Service: Endoscopy;  Laterality: N/A;   CORONARY ARTERY BYPASS GRAFT N/A 09/19/2021   Procedure: CORONARY ARTERY BYPASS GRAFTING X1 Using Left Internal Mammory artery;  Surgeon: BGaye Pollack MD;  [place]  Service: Open Heart Surgery;  Laterality: N/A;   ESOPHAGOGASTRODUODENOSCOPY (EGD) WITH PROPOFOL N/A 11/17/2015   Procedure: ESOPHAGOGASTRODUODENOSCOPY (EGD) WITH PROPOFOL;  Surgeon: Manya Silvas, MD;  Location: [facility];  Service: Endoscopy;  Laterality: N/A;   EYE SURGERY Bilateral    cataract extraction   FOOT SURGERY     INTRAVASCULAR PRESSURE WIRE/FFR STUDY N/A 05/24/2021   Procedure: INTRAVASCULAR PRESSURE WIRE/FFR STUDY;  Surgeon: Yolonda Kida, MD;  Location: [place] CV LAB;  Service: Cardiovascular;  Laterality: N/A;   MANDIBLE SURGERY     NASAL SINUS SURGERY  03/2013   Multile surgery Dr. Carlis Abbott. 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 Tamala Julian   RIGHT/LEFT HEART CATH AND CORONARY ANGIOGRAPHY N/A 05/24/2021   Procedure: RIGHT/LEFT HEART CATH AND CORONARY ANGIOGRAPHY;  Surgeon: Yolonda Kida, MD;  Location: [place] CV LAB;  Service: Cardiovascular;  Laterality: N/A;   TEE WITHOUT CARDIOVERSION N/A 09/19/2021   Procedure: TRANSESOPHAGEAL ECHOCARDIOGRAM (TEE);  Surgeon: Gaye Pollack, MD;  [facility] [place] [facility] [place];  Service: Open Heart Surgery;  Laterality: N/A;   TONSILLECTOMY  1960   Family History  Problem Relation Age of Onset   Diabetes Father    Lung cancer Mother    Breast cancer Maternal Aunt    Irritable bowel syndrome Sister    Colon cancer Brother    Social History   Socioeconomic History   Marital status: Married    Spouse name: [name], husband   Number of children: Not on file   Years of education: Not on file   Highest education level: Not on file  Occupational History    Comment:  retired  Tobacco Use   Smoking status: Never   Smokeless tobacco: Never  Vaping Use   Vaping Use: Never used  Substance and Sexual Activity   Alcohol use: No   Drug use: No   Sexual activity: Not Currently    Birth control/protection: Post-menopausal  Other Topics Concern   Not on file  Social History Narrative   Not on file   Social Determinants of Health   Financial Resource Strain: High Risk (12/21/2019)   Overall Financial Resource Strain (CARDIA)    Difficulty of Paying Living Expenses: Hard  Food Insecurity: Not on file  Transportation Needs: Not on file  Physical Activity: Inactive (01/31/2022)   Exercise Vital Sign    Days of Exercise per Week: 0 days    Minutes of Exercise per Session: 0 min  Stress: Stress Concern Present (01/31/2022)   [place]    Feeling of Stress : To some extent  Social Connections: Not on file    Tobacco Counseling Counseling given: Not Answered   Clinical Intake:  Pre-visit preparation completed: Yes  Pain : No/denies pain     Nutritional Risks: None Diabetes: Yes CBG done?: No Did pt. bring in CBG monitor from home?: No  How often do you need to have someone help you when you read instructions, pamphlets, or other written materials from your doctor or pharmacy?: 1 - Never  Diabetic?yes Nutrition Risk Assessment:  Has the patient had any N/V/D within the last 2 months?  Yes  Does the patient have any non-healing wounds?  No  Has the patient had any unintentional weight loss or weight gain?  No   Diabetes:  Is the patient diabetic?  Yes  If diabetic, was a CBG obtained today?  No  Did the patient  bring in their glucometer from home?  No  How often do you monitor your CBG's? occasionally.   Financial Strains and Diabetes Management:  Are you having any financial strains with the device, your supplies or your medication? No .  Does the patient want to be  seen by Chronic Care Management for management of their diabetes?  No  Would the patient like to be referred to a Nutritionist or for Diabetic Management?  No   Diabetic Exams:  Diabetic Eye Exam: Completed 06/30/20. Overdue for diabetic eye exam. Pt has been advised about the importance in completing this exam.   Diabetic Foot Exam: Completed no. Pt has been advised about the importance in completing this exam.   Interpreter Needed?: No  Information entered by :: Kirke Shaggy LPN   Activities of Daily Living    10/26/2021    1:53 AM 09/22/2021   10:44 PM  In your present state of health, do you have any difficulty performing the following activities:  Hearing? 0 0  Vision? 0 0  Difficulty concentrating or making decisions? 1 1  Walking or climbing stairs? 0 0  Dressing or bathing? 0 0  Doing errands, shopping? 0 0    Patient Care Team: [name], MD as PCP - General (Family Medicine) Chauncey Mann, MD as Referring Physician (Psychiatry) Vladimir Crofts, MD as Consulting Physician (Neurology) Deetta Perla, MD as Consulting Physician (Neurosurgery) Germaine Pomfret, [facility] (Pharmacist)  Indicate any recent Medical Services you may have received from other than [facility] providers in the past year (date may be approximate).     Assessment:   This is a routine wellness examination for [name].  Hearing/Vision screen No results found.  Dietary issues and exercise activities discussed:     Goals Addressed             This Visit's Progress    DIET - EAT MORE FRUITS AND VEGETABLES         Depression Screen    01/31/2022    3:33 PM 10/20/2021   11:18 AM 02/09/2021    9:36 AM 10/27/2020    9:52 AM 09/01/2020    8:35 AM 06/17/2019    3:01 PM 08/29/2016    8:35 AM  PHQ 2/9 Scores  PHQ - 2 Score 1 0 '2 2 2 '$ 0 2  PHQ- 9 Score '3 2 2 7 9 2     '$ Fall Risk    10/14/2021    1:13 PM 02/09/2021    9:36 AM 10/27/2020    9:52 AM 09/01/2020    8:34 AM 11/19/2019    8:45 AM   Fall Risk   Falls in the past year? '1 1 1 1 1  '$ Number falls in past yr: '1 1 1 1 1  '$ Comment  4     Injury with Fall? 0 0  1 0  Comment when turns quickly get some feeling and then she falls      Risk for fall due to : Medication side effect History of fall(s)     Follow up Education provided;Falls prevention discussed   Falls prevention discussed   Comment is doing balance therapy with [place] PERTAINING TO THE HOME:  Any stairs in or around the home? No  If so, are there any without handrails? No  Home free of loose throw rugs in walkways, pet beds, electrical cords, etc? Yes  Adequate lighting  in your home to reduce risk of falls? Yes   ASSISTIVE DEVICES UTILIZED TO PREVENT FALLS:  Life alert? No  Use of a cane, walker or w/c? Yes  Grab bars in the bathroom? Yes  Shower chair or bench in shower? Yes  Elevated toilet seat or a handicapped toilet? No    Cognitive Function:declined Pt is A & O    09/01/2020    8:49 AM  MMSE - Mini Mental State Exam  Orientation to time 5  Orientation to Place 5  Registration 3  Attention/ Calculation 5  Recall 3  Language- name 2 objects 2  Language- repeat 1  Language- follow 3 step command 3  Language- read & follow direction 1  Write a sentence 1  Copy design 1  Total score 30        Immunizations Immunization History  Administered Date(s) Administered   Fluad Quad(high Dose 65+) 02/09/2021   Hep A / Hep B 02/15/2011, 03/29/2011, 09/06/2011   Influenza, High Dose Seasonal PF 04/18/2017   Influenza,inj,Quad PF,6+ Mos 04/23/2015, 02/17/2016   Influenza-Unspecified 03/25/2019   PFIZER(Purple Top)SARS-COV-2 Vaccination 08/03/2019, 08/27/2019   Pneumococcal Conjugate-13 04/18/2017   Pneumococcal Polysaccharide-23 11/27/2011, 03/29/2018   Tdap 10/09/2007   Zoster, Live 11/27/2011    TDAP status: Due, Education has been provided regarding the importance of this vaccine. Advised may  receive this vaccine at local pharmacy or Health Dept. Aware to provide a copy of the vaccination record if obtained from local pharmacy or Health Dept. Verbalized acceptance and understanding.  Flu Vaccine status: Up to date  Pneumococcal vaccine status: Up to date  Covid-19 vaccine status: Completed vaccines  Qualifies for Shingles Vaccine? Yes   Zostavax completed Yes   Shingrix Completed?: No.    Education has been provided regarding the importance of this vaccine. Patient has been advised to call insurance company to determine out of pocket expense if they have not yet received this vaccine. Advised may also receive vaccine at local pharmacy or Health Dept. Verbalized acceptance and understanding.  Screening Tests Health Maintenance  Topic Date Due   FOOT EXAM  Never done   Zoster Vaccines- Shingrix (1 of 2) Never done   DEXA SCAN  Never done   TETANUS/TDAP  10/08/2017   COVID-19 Vaccine (3 - Pfizer risk series) 09/24/2019   COLONOSCOPY (Pts 45-27yr Insurance coverage will need to be confirmed)  11/16/2020   OPHTHALMOLOGY EXAM  06/30/2021   INFLUENZA VACCINE  01/10/2022   HEMOGLOBIN A1C  03/17/2022   Diabetic kidney evaluation - Urine ACR  10/01/2022   Diabetic kidney evaluation - GFR measurement  11/03/2022   MAMMOGRAM  12/21/2023   Pneumonia Vaccine 70 Years old  Completed   Hepatitis C Screening  Completed   HPV VACCINES  Aged Out    Health Maintenance  Health Maintenance Due  Topic Date Due   FOOT EXAM  Never done   Zoster Vaccines- Shingrix (1 of 2) Never done   DEXA SCAN  Never done   TETANUS/TDAP  10/08/2017   COVID-19 Vaccine (3 - Pfizer risk series) 09/24/2019   COLONOSCOPY (Pts 45-457yrInsurance coverage will need to be confirmed)  11/16/2020   OPHTHALMOLOGY EXAM  06/30/2021   INFLUENZA VACCINE  01/10/2022    Colorectal cancer screening: Type of screening: Colonoscopy. Completed 11/17/15. Repeat every 5 years-  Mammogram status: Completed 12/20/21.  Repeat every year  Bone Density status: Ordered referral sent. Pt provided with contact info and advised to call to schedule appt.  Lung Cancer Screening: (Low Dose CT Chest recommended if Age 19-80 years, 30 pack-year currently smoking OR have quit w/in 15years.) does not qualify.   Additional Screening:  Hepatitis C Screening: does qualify; Completed 06/17/19  Vision Screening: Recommended annual ophthalmology exams for early detection of glaucoma and other disorders of the eye. Is the patient up to date with their annual eye exam?  Yes  Who is the provider or what is the name of the office in which the patient attends annual eye exams? [place] If pt is not established with a provider, would they like to be referred to a provider to establish care? No .   Dental Screening: Recommended annual dental exams for proper oral hygiene  Community Resource Referral / Chronic Care Management: CRR required this visit?  No   CCM required this visit?  No      Plan:     I have personally reviewed and noted the following in the patient's chart:   Medical and social history Use of alcohol, tobacco or illicit drugs  Current medications and supplements including opioid prescriptions. Patient is currently taking opioid prescriptions. Information provided to patient regarding non-opioid alternatives. Patient advised to discuss non-opioid treatment plan with their provider. Functional ability and status Nutritional status Physical activity Advanced directives List of other physicians Hospitalizations, surgeries, and ER visits in previous 12 months Vitals Screenings to include cognitive, depression, and falls Referrals and appointments  In addition, I have reviewed and discussed with patient certain preventive protocols, quality metrics, and best practice recommendations. A written personalized care plan for preventive services as well as general preventive health recommendations were  provided to patient.     Dionisio David, LPN   [id number]   Nurse Notes: none

## 2022-01-31 NOTE — Patient Instructions (Signed)
Katie Buckley , Thank you for taking time to come for your Medicare Wellness Visit. I appreciate your ongoing commitment to your health goals. Please review the following plan we discussed and let me know if I can assist you in the future.   Screening recommendations/referrals: Colonoscopy: 11/17/15 - referral sent Mammogram: 12/20/21 Bone Density: referral sent Recommended yearly ophthalmology/optometry visit for glaucoma screening and checkup Recommended yearly dental visit for hygiene and checkup  Vaccinations: Influenza vaccine: 02/09/21 Pneumococcal vaccine: 03/29/18 Tdap vaccine: 10/09/07, due if have injury Shingles vaccine: Zostavax 11/27/11   Covid-19:08/03/19, 08/27/19  Advanced directives: no  Conditions/risks identified: none  Next appointment: Follow up in one year for your annual wellness visit - 02/05/23 @ 1 pm by phone   Preventive Care 15 Years and Older, Female Preventive care refers to lifestyle choices and visits with your health care provider that can promote health and wellness. What does preventive care include? A yearly physical exam. This is also called an annual well check. Dental exams once or twice a year. Routine eye exams. Ask your health care provider how often you should have your eyes checked. Personal lifestyle choices, including: Daily care of your teeth and gums. Regular physical activity. Eating a healthy diet. Avoiding tobacco and drug use. Limiting alcohol use. Practicing safe sex. Taking low-dose aspirin every day. Taking vitamin and mineral supplements as recommended by your health care provider. What happens during an annual well check? The services and screenings done by your health care provider during your annual well check will depend on your age, overall health, lifestyle risk factors, and family history of disease. Counseling  Your health care provider may ask you questions about your: Alcohol use. Tobacco use. Drug use. Emotional  well-being. Home and relationship well-being. Sexual activity. Eating habits. History of falls. Memory and ability to understand (cognition). Work and work Statistician. Reproductive health. Screening  You may have the following tests or measurements: Height, weight, and BMI. Blood pressure. Lipid and cholesterol levels. These may be checked every 5 years, or more frequently if you are over 38 years old. Skin check. Lung cancer screening. You may have this screening every year starting at age 9 if you have a 30-pack-year history of smoking and currently smoke or have quit within the past 15 years. Fecal occult blood test (FOBT) of the stool. You may have this test every year starting at age 72. Flexible sigmoidoscopy or colonoscopy. You may have a sigmoidoscopy every 5 years or a colonoscopy every 10 years starting at age 63. Hepatitis C blood test. Hepatitis B blood test. Sexually transmitted disease (STD) testing. Diabetes screening. This is done by checking your blood sugar (glucose) after you have not eaten for a while (fasting). You may have this done every 1-3 years. Bone density scan. This is done to screen for osteoporosis. You may have this done starting at age 83. Mammogram. This may be done every 1-2 years. Talk to your health care provider about how often you should have regular mammograms. Talk with your health care provider about your test results, treatment options, and if necessary, the need for more tests. Vaccines  Your health care provider may recommend certain vaccines, such as: Influenza vaccine. This is recommended every year. Tetanus, diphtheria, and acellular pertussis (Tdap, Td) vaccine. You may need a Td booster every 10 years. Zoster vaccine. You may need this after age 74. Pneumococcal 13-valent conjugate (PCV13) vaccine. One dose is recommended after age 83. Pneumococcal polysaccharide (PPSV23) vaccine. One dose is  recommended after age 34. Talk to your  health care provider about which screenings and vaccines you need and how often you need them. This information is not intended to replace advice given to you by your health care provider. Make sure you discuss any questions you have with your health care provider. Document Released: 06/25/2015 Document Revised: 02/16/2016 Document Reviewed: 03/30/2015 Elsevier Interactive Patient Education  2017 [place] Prevention in the Home Falls can cause injuries. They can happen to people of all ages. There are many things you can do to make your home safe and to help prevent falls. What can I do on the outside of my home? Regularly fix the edges of walkways and driveways and fix any cracks. Remove anything that might make you trip as you walk through a door, such as a raised step or threshold. Trim any bushes or trees on the path to your home. Use bright outdoor lighting. Clear any walking paths of anything that might make someone trip, such as rocks or tools. Regularly check to see if handrails are loose or broken. Make sure that both sides of any steps have handrails. Any raised decks and porches should have guardrails on the edges. Have any leaves, snow, or ice cleared regularly. Use sand or salt on walking paths during winter. Clean up any spills in your garage right away. This includes oil or grease spills. What can I do in the bathroom? Use night lights. Install grab bars by the toilet and in the tub and shower. Do not use towel bars as grab bars. Use non-skid mats or decals in the tub or shower. If you need to sit down in the shower, use a plastic, non-slip stool. Keep the floor dry. Clean up any water that spills on the floor as soon as it happens. Remove soap buildup in the tub or shower regularly. Attach bath mats securely with double-sided non-slip rug tape. Do not have throw rugs and other things on the floor that can make you trip. What can I do in the bedroom? Use night  lights. Make sure that you have a light by your bed that is easy to reach. Do not use any sheets or blankets that are too big for your bed. They should not hang down onto the floor. Have a firm chair that has side arms. You can use this for support while you get dressed. Do not have throw rugs and other things on the floor that can make you trip. What can I do in the kitchen? Clean up any spills right away. Avoid walking on wet floors. Keep items that you use a lot in easy-to-reach places. If you need to reach something above you, use a strong step stool that has a grab bar. Keep electrical cords out of the way. Do not use floor polish or wax that makes floors slippery. If you must use wax, use non-skid floor wax. Do not have throw rugs and other things on the floor that can make you trip. What can I do with my stairs? Do not leave any items on the stairs. Make sure that there are handrails on both sides of the stairs and use them. Fix handrails that are broken or loose. Make sure that handrails are as long as the stairways. Check any carpeting to make sure that it is firmly attached to the stairs. Fix any carpet that is loose or worn. Avoid having throw rugs at the top or bottom of the stairs. If  you do have throw rugs, attach them to the floor with carpet tape. Make sure that you have a light switch at the top of the stairs and the bottom of the stairs. If you do not have them, ask someone to add them for you. What else can I do to help prevent falls? Wear shoes that: Do not have high heels. Have rubber bottoms. Are comfortable and fit you well. Are closed at the toe. Do not wear sandals. If you use a stepladder: Make sure that it is fully opened. Do not climb a closed stepladder. Make sure that both sides of the stepladder are locked into place. Ask someone to hold it for you, if possible. Clearly mark and make sure that you can see: Any grab bars or handrails. First and last  steps. Where the edge of each step is. Use tools that help you move around (mobility aids) if they are needed. These include: Canes. Walkers. Scooters. Crutches. Turn on the lights when you go into a dark area. Replace any light bulbs as soon as they burn out. Set up your furniture so you have a clear path. Avoid moving your furniture around. If any of your floors are uneven, fix them. If there are any pets around you, be aware of where they are. Review your medicines with your doctor. Some medicines can make you feel dizzy. This can increase your chance of falling. Ask your doctor what other things that you can do to help prevent falls. This information is not intended to replace advice given to you by your health care provider. Make sure you discuss any questions you have with your health care provider. Document Released: 03/25/2009 Document Revised: 11/04/2015 Document Reviewed: 07/03/2014 Elsevier Interactive Patient Education  2017 [employer].

## 2022-02-03 ENCOUNTER — Other Ambulatory Visit: Payer: Self-pay | Admitting: Family Medicine

## 2022-02-03 DIAGNOSIS — R12 Heartburn: Secondary | ICD-10-CM

## 2022-02-14 DIAGNOSIS — F411 Generalized anxiety disorder: Secondary | ICD-10-CM | POA: Diagnosis not present

## 2022-02-14 DIAGNOSIS — F331 Major depressive disorder, recurrent, moderate: Secondary | ICD-10-CM | POA: Diagnosis not present

## 2022-02-15 NOTE — Consult Note (Signed)
CARDIOLOGY CONSULT NOTE               Patient ID: Katie Buckley MRN: [id number] DOB/AGE: [birth year] 71 y.o.  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  Hypertension hyperlipidemia aortic stenosis palpitations tachycardia now with elevated temperature shortness of breath and chest x-ray suggestive of pneumonia.  Patient had done reasonably well but started having cough congestion shortness of breath and elevated temperature chest x-ray does suggest possible pneumonia  Review of systems complete and found to be negative unless listed above     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    Past Surgical History:  Procedure Laterality Date   ANTERIOR CERVICAL DECOMP/DISCECTOMY FUSION N/A 11/15/2020   Procedure: ANTERIOR CERVICAL DECOMPRESSION/DISCECTOMY FUSION 1 LEVEL C3/4;  Surgeon: Deetta Perla, MD;  Location: [facility];  Service: Neurosurgery;  Laterality: N/A;   AORTIC VALVE REPLACEMENT N/A 09/19/2021   Procedure: AORTIC VALVE REPLACEMENT Using 60m Edwards Resilia Inspiris Valve;  Surgeon: BGaye Pollack MD;  [place]  Service: Open Heart Surgery;  Laterality: N/A;   AUGMENTATION MAMMAPLASTY Bilateral     breast implants   Bone Spur  2007   foot   CARDIAC CATHETERIZATION     Carpal Tunnel Symdrome  2008   as repeated in 2009   CATARACT EXTRACTION W/PHACO Left 01/16/2019   Procedure: CATARACT EXTRACTION PHACO AND INTRAOCULAR LENS PLACEMENT ([place] LEFT DIABETES VISION BLUE;  Surgeon: HMarchia Meiers MD;  Location: [facility];  Service: Ophthalmology;  Laterality: Left;  [place] 00:47 CDE 6.62 Fluid pack lot # [id number]   CATARACT EXTRACTION W/PHACO Right 02/06/2019   Procedure: CATARACT EXTRACTION PHACO AND INTRAOCULAR LENS PLACEMENT (IOC);  Surgeon: HMarchia Meiers MD;  Location: [facility];  Service: Ophthalmology;  Laterality: Right;  [place]00:54.6 CDE 6.52 FLUID PACK LOT # [id number]   CHOLECYSTECTOMY  2007   COLONOSCOPY WITH PROPOFOL N/A 11/17/2015   Procedure: COLONOSCOPY WITH PROPOFOL;  Surgeon: RManya Silvas MD;  [facility]  Service: Endoscopy;  Laterality: N/A;   CORONARY ARTERY BYPASS GRAFT N/A 09/19/2021   Procedure: CORONARY ARTERY BYPASS GRAFTING X1 Using Left Internal Mammory artery;  Surgeon: BGaye Pollack MD;  [place]  Service: Open Heart Surgery;  Laterality: N/A;   ESOPHAGOGASTRODUODENOSCOPY (EGD) WITH PROPOFOL N/A 11/17/2015   Procedure: ESOPHAGOGASTRODUODENOSCOPY (EGD) WITH PROPOFOL;  Surgeon: RManya Silvas MD;  [facility];  Service: Endoscopy;  Laterality: N/A;   EYE SURGERY Bilateral    cataract extraction   FOOT SURGERY     INTRAVASCULAR PRESSURE WIRE/FFR STUDY N/A 05/24/2021   Procedure: INTRAVASCULAR PRESSURE WIRE/FFR STUDY;  Surgeon: CYolonda Kida MD;  [place];  Service: Cardiovascular;  Laterality: N/A;   MANDIBLE SURGERY     NASAL SINUS SURGERY  03/2013   Multile surgery Dr. Carlis Abbott. 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 Tamala Julian   RIGHT/LEFT HEART CATH AND CORONARY ANGIOGRAPHY N/A 05/24/2021   Procedure: RIGHT/LEFT HEART CATH AND CORONARY ANGIOGRAPHY;  Surgeon:  Yolonda Kida, MD;  [place] [facility];  Service: Cardiovascular;  Laterality: N/A;   TEE WITHOUT CARDIOVERSION N/A 09/19/2021   Procedure: TRANSESOPHAGEAL ECHOCARDIOGRAM (TEE);  Surgeon: Gaye Pollack, MD;  Location: [place];  Service: Open Heart Surgery;  Laterality: N/A;   TONSILLECTOMY  1960    No medications prior to admission.   Social History   Socioeconomic History   Marital status: Married    Spouse name: [name], husband   Number of children: Not on file   Years of education: Not on file   Highest education level: Not on file  Occupational History    Comment: retired  Tobacco Use   Smoking status: Never   Smokeless tobacco: Never  Vaping Use   Vaping Use: Never used  Substance and Sexual Activity   Alcohol use: No   Drug use: No   Sexual activity: Not Currently    Birth control/protection: Post-menopausal  Other Topics Concern   Not on file  Social History Narrative   Not on file   Social Determinants of Health   Financial Resource Strain: Low Risk  (01/31/2022)   Overall Financial Resource Strain (CARDIA)    Difficulty of Paying Living Expenses: Not hard at all  Food Insecurity: No Food Insecurity (01/31/2022)   Hunger Vital Sign    Worried About Running Out of Food in the Last Year: Never true    [place] in the Last Year: Never true  Transportation Needs: No Transportation Needs (01/31/2022)   PRAPARE - Transportation    Lack of Transportation (Medical): No    Lack of Transportation (Non-Medical): No  Physical Activity: Inactive (01/31/2022)   Exercise Vital Sign    Days of Exercise per Week: 0 days    Minutes of Exercise per Session: 0 min  Stress: Stress Concern Present (01/31/2022)   [place]    Feeling of Stress : To some extent  Social Connections: Moderately Isolated (01/31/2022)   Social Connection and Isolation Panel [NHANES]    Frequency of Communication with  Friends and Family: Twice a week    Frequency of Social Gatherings with Friends and Family: Once a week    Attends Religious Services: Never    Marine scientist or Organizations: No    Attends Archivist Meetings: Never    Marital Status: Married  Human resources officer Violence: Not At Risk (01/31/2022)   Humiliation, Afraid, Rape, and Kick questionnaire    Fear of Current or Ex-Partner: No    Emotionally Abused: No    Physically Abused: No    Sexually Abused: No    Family History  Problem Relation Age of Onset   Diabetes Father    Lung cancer Mother    Breast cancer Maternal Aunt    Irritable bowel syndrome Sister    Colon cancer Brother       Review of systems complete and found to be negative unless listed above      PHYSICAL EXAM  General: Well developed, well nourished, in no acute distress HEENT:  Normocephalic and atramatic Neck:  No JVD.  Lungs: Diminished diffuse rhonchi bilaterally to auscultation and percussion. Heart: HRRR . Normal S1 and S2 without gallops or murmurs.  Abdomen: Bowel sounds are positive, abdomen soft and non-tender  Msk:  Back normal, normal gait. Normal strength and tone for age. Extremities: No clubbing, cyanosis or edema.   Neuro: Alert and oriented X 3. Psych:  Good affect, responds appropriately  Labs:   Lab Results  Component Value Date   WBC 17.9 (H) 11/02/2021   HGB 10.3 (L) 11/02/2021   HCT 32.4 (L) 11/02/2021   MCV 77.0 (L) 11/02/2021   PLT 496 (H) 11/02/2021   No results for input(s): "NA", "K", "CL", "CO2", "BUN", "CREATININE", "CALCIUM", "PROT", "BILITOT", "ALKPHOS", "ALT", "AST", "GLUCOSE" in the last 168 hours.  Invalid input(s): "LABALBU" Lab Results  Component Value Date   TROPONINI <0.03 08/03/2016    Lab Results  Component Value Date   CHOL 87 09/21/2021   CHOL 236 (H) 02/09/2021   CHOL 294 (H) 09/01/2020   Lab Results  Component Value Date   HDL 42 09/21/2021   HDL 88 02/09/2021   HDL 98  09/01/2020   Lab Results  Component Value Date   LDLCALC 21 09/21/2021   LDLCALC 108 (H) 02/09/2021   LDLCALC 161 (H) 09/01/2020   Lab Results  Component Value Date   TRIG 122 09/21/2021   TRIG 238 (H) 02/09/2021   TRIG 196 (H) 09/01/2020   Lab Results  Component Value Date   CHOLHDL 2.1 09/21/2021   CHOLHDL 2.7 02/09/2021   CHOLHDL 3.0 09/01/2020   No results found for: "LDLDIRECT"    Radiology: No results found.  EKG: EKG independently reviewed by me shows sinus rhythm rate of 75 right bundle branch block nonspecific ST-T changes  ASSESSMENT AND PLAN:  Impression  Sepsis Hypoxia  SOB PNA community acquired  Bronchitis  Cough Congestion S/p CABG S/P SVAR CAD AFIB-p Obesity Hyperlipidemia  . Plan Agree with admission  Supplemental O2 nasal cannula To keep sats above 95 Continue Antibiotics for Pneumonia broad-spectrum Rocephin Zithromax IV Inhalers for congestion maintain adequate inhaler therapy to help with shortness of breath IV lasix for possible mild CHF-D Echo to reassess prosthetic AoV D/C Amiodarone for possible interstitial lung disease Recommend pulmonary input persistent dyspnea shortness of breath Continue supportive care  Signed: Yolonda Kida MD 02/15/2022, 10:32 PM

## 2022-02-16 DIAGNOSIS — M25561 Pain in right knee: Secondary | ICD-10-CM | POA: Diagnosis not present

## 2022-02-27 DIAGNOSIS — F411 Generalized anxiety disorder: Secondary | ICD-10-CM | POA: Diagnosis not present

## 2022-02-27 DIAGNOSIS — G4700 Insomnia, unspecified: Secondary | ICD-10-CM | POA: Diagnosis not present

## 2022-02-27 DIAGNOSIS — R41840 Attention and concentration deficit: Secondary | ICD-10-CM | POA: Diagnosis not present

## 2022-02-27 DIAGNOSIS — F331 Major depressive disorder, recurrent, moderate: Secondary | ICD-10-CM | POA: Diagnosis not present

## 2022-03-03 ENCOUNTER — Ambulatory Visit
Admission: EM | Admit: 2022-03-03 | Discharge: 2022-03-03 | Disposition: A | Discharge status: Home / Self Care | Payer: PPO | Attending: Urgent Care | Admitting: Urgent Care

## 2022-03-03 DIAGNOSIS — E119 Type 2 diabetes mellitus without complications: Secondary | ICD-10-CM

## 2022-03-03 DIAGNOSIS — N949 Unspecified condition associated with female genital organs and menstrual cycle: Secondary | ICD-10-CM | POA: Diagnosis not present

## 2022-03-03 DIAGNOSIS — F331 Major depressive disorder, recurrent, moderate: Secondary | ICD-10-CM | POA: Diagnosis not present

## 2022-03-03 DIAGNOSIS — R3915 Urgency of urination: Secondary | ICD-10-CM | POA: Diagnosis not present

## 2022-03-03 DIAGNOSIS — R350 Frequency of micturition: Secondary | ICD-10-CM | POA: Diagnosis not present

## 2022-03-03 DIAGNOSIS — F411 Generalized anxiety disorder: Secondary | ICD-10-CM | POA: Diagnosis not present

## 2022-03-03 DIAGNOSIS — R300 Dysuria: Secondary | ICD-10-CM

## 2022-03-03 LAB — POCT FASTING CBG KUC MANUAL ENTRY: POCT Glucose (KUC): 178 mg/dL — AB (ref 70–99)

## 2022-03-03 MED ORDER — FLUCONAZOLE 150 MG PO TABS: 150.0000 mg | ORAL_TABLET | ORAL | 0 refills | Status: DC

## 2022-03-03 MED ORDER — PHENAZOPYRIDINE HCL 200 MG PO TABS: 200.0000 mg | ORAL_TABLET | Freq: Three times a day (TID) | ORAL | 0 refills | Status: DC | PRN

## 2022-03-03 NOTE — ED Triage Notes (Signed)
Patient to Urgent Care with complaints of fatigue, disorientation, and dysuria. Reports symptoms started yesterday. Denies any known fevers.

## 2022-03-03 NOTE — Discharge Instructions (Signed)
Please make sure you are hydrating as well as he can with 64 ounces of water daily.  I am prescribing you oral fluconazole to help with the suspected yeast infection of the vaginal area.  You can use Pyridium to help you with the painful urination.  If you continue to have urine symptoms over the weekend then please come back to our clinic for a urine specimen collection.

## 2022-03-03 NOTE — ED Provider Notes (Signed)
[facility]  Note:  This document was prepared using Systems analyst and may include unintentional dictation errors.  MRN: [id number] DOB: [birth year]  Subjective:   Katie Buckley is a 71 y.o. female presenting for 2-day history of acute onset recurrent dysuria, urinary frequency and urgency, vaginal and genital irritation.  No fever, nausea, vomiting, abdominal pain, vaginal discharge.  Patient has a remote history of yeast infections.  Reports that ever since she had her CABG, aortic valve replacement in April she has had changes to how she urinates.  Admits that she also does not hydrate very well with water.  More often than not she will instead drink a Coke, sweet tea or apple juice.  No history of CKD, renal stones.  No current facility-administered medications for this encounter.  Current Outpatient Medications:    acetaminophen (TYLENOL) 650 MG CR tablet, Take 1,300 mg by mouth every 8 (eight) hours as needed for pain (pain)., Disp: , Rfl:    amiodarone (PACERONE) 200 MG tablet, Take 1 tablet (200 mg total) by mouth daily. Home med. (Patient not taking: Reported on 01/31/2022), Disp: 60 tablet, Rfl: 1   amLODipine (NORVASC) 10 MG tablet, Take 1 tablet (10 mg total) by mouth daily. (Patient not taking: Reported on 01/31/2022), Disp: 30 tablet, Rfl: 2   amLODipine (NORVASC) 10 MG tablet, Take 1 tablet by mouth daily. (Patient not taking: Reported on 01/31/2022), Disp: , Rfl:    amoxicillin-clavulanate (AUGMENTIN) 875-125 MG tablet, Take 1 tablet by mouth 2 (two) times daily. (Patient not taking: Reported on 01/31/2022), Disp: , Rfl:    apixaban (ELIQUIS) 5 MG TABS tablet, Take 1 tablet (5 mg total) by mouth 2 (two) times daily., Disp: 60 tablet, Rfl: 2   ARIPiprazole (ABILIFY) 5 MG tablet, , Disp: , Rfl:    Armodafinil 250 MG tablet, , Disp: , Rfl:    aspirin EC 325 MG tablet, Take 1 tablet by mouth daily., Disp: , Rfl:    aspirin EC 81 MG tablet, Take 1  tablet (81 mg total) by mouth daily. Swallow whole., Disp: , Rfl:    atorvastatin (LIPITOR) 80 MG tablet, , Disp: , Rfl:    baclofen (LIORESAL) 10 MG tablet, TAKE 1 TABLET (10 MG TOTAL) BY MOUTH 2 (TWO) TIMES DAILY AS NEEDED FOR UP TO 30 DAYS (Patient not taking: Reported on 01/31/2022), Disp: , Rfl:    buPROPion (WELLBUTRIN SR) 150 MG 12 hr tablet, Take 150 mg by mouth 2 (two) times daily., Disp: , Rfl:    buPROPion ER (WELLBUTRIN SR) 100 MG 12 hr tablet, TAKE 1 TWICE DAILY AT 8AM AND 1PM, Disp: , Rfl:    butalbital-acetaminophen-caffeine (FIORICET) 50-325-40 MG tablet, TAKE 1 TABLET BY MOUTH EVERY 6 HOURS AS NEEDED FOR HEADACHE (Patient taking differently: Take 1 tablet by mouth every 6 (six) hours as needed for headache.), Disp: 30 tablet, Rfl: 4   empagliflozin (JARDIANCE) 10 MG TABS tablet, Take 1 tablet by mouth daily., Disp: , Rfl:    ezetimibe (ZETIA) 10 MG tablet, TAKE 1 TABLET BY MOUTH EVERYDAY AT BEDTIME (Patient taking differently: Take 10 mg by mouth at bedtime.), Disp: 90 tablet, Rfl: 0   fexofenadine (ALLEGRA) 180 MG tablet, Take 180 mg by mouth daily as needed for allergies or rhinitis., Disp: , Rfl:    furosemide (LASIX) 40 MG tablet, TAKE 1 TABLET (40 MG TOTAL) BY MOUTH DAILY. FOR 5 DAYS THEN STOP. (Patient not taking: Reported on 01/31/2022), Disp: , Rfl:  glipiZIDE (GLUCOTROL) 10 MG tablet, TAKE 1 TABLET BY MOUTH EVERY DAY BEFORE BREAKFAST (Patient taking differently: Take 10 mg by mouth daily before breakfast.), Disp: 90 tablet, Rfl: 4   hydrOXYzine (ATARAX) 50 MG tablet, TAKE 1 TWICE DAILY AS NEEDED, Disp: , Rfl:    hydrOXYzine (VISTARIL) 25 MG capsule, Take 25 mg by mouth daily as needed for anxiety., Disp: , Rfl:    hydrOXYzine (VISTARIL) 50 MG capsule, TAKE 1 CAPSULE BY MOUTH TWICE A DAY AS NEEDED, Disp: , Rfl:    irbesartan (AVAPRO) 75 MG tablet, TAKE 1 TABLET BY MOUTH EVERY DAY (Patient not taking: Reported on 01/31/2022), Disp: 90 tablet, Rfl: 4   JARDIANCE 25 MG TABS  tablet, TAKE 1 TABLET (25 MG TOTAL) BY MOUTH DAILY. (Patient taking differently: 25 mg daily.), Disp: 90 tablet, Rfl: 4   Lancet Devices MISC, , Disp: , Rfl:    levofloxacin (LEVAQUIN) 750 MG tablet, TAKE 1 TABLET BY MOUTH DAILY FOR 4 DAYS. (Patient not taking: Reported on 01/31/2022), Disp: , Rfl:    loperamide (IMODIUM) 2 MG capsule, Take 2-4 mg by mouth daily as needed for diarrhea or loose stools., Disp: , Rfl:    LYRICA 25 MG capsule, , Disp: , Rfl:    metFORMIN (GLUCOPHAGE-XR) 500 MG 24 hr tablet, TAKE 2 TABLETS BY MOUTH EVERY DAY, Disp: 180 tablet, Rfl: 3   methocarbamol (ROBAXIN) 500 MG tablet, Take 1 tablet (500 mg total) by mouth every 6 (six) hours as needed for muscle spasms. (Patient not taking: Reported on 01/31/2022), Disp: 60 tablet, Rfl: 0   metoprolol tartrate (LOPRESSOR) 50 MG tablet, Take 1 tablet (50 mg total) by mouth 2 (two) times daily., Disp: 60 tablet, Rfl: 2   metoprolol-hydrochlorothiazide (LOPRESSOR HCT) 100-25 MG tablet, Take 0.5 tablets by mouth daily., Disp: , Rfl:    metroNIDAZOLE (FLAGYL) 500 MG tablet, Take 1 tablet by mouth 3 (three) times daily. (Patient not taking: Reported on 01/31/2022), Disp: , Rfl:    montelukast (SINGULAIR) 10 MG tablet, Take 1 tablet (10 mg total) by mouth at bedtime as needed (allergies). Home med., Disp: , Rfl:    montelukast (SINGULAIR) 10 MG tablet, Take 1 tablet by mouth daily., Disp: , Rfl:    nortriptyline (PAMELOR) 10 MG capsule, , Disp: , Rfl:    Olopatadine HCl 0.7 % SOLN, Place 1 drop into both eyes daily as needed (allergies)., Disp: , Rfl:    ondansetron (ZOFRAN ODT) 4 MG disintegrating tablet, Take 1 tablet (4 mg total) by mouth every 8 (eight) hours as needed for nausea or vomiting., Disp: 20 tablet, Rfl: 0   oxyCODONE (OXY IR/ROXICODONE) 5 MG immediate release tablet, TAKE 1 TABLET BY MOUTH EVERY 6 (SIX) HOURS AS NEEDED FOR MODERATE PAIN ((SCORE 4 TO 6)). (Patient not taking: Reported on 01/31/2022), Disp: , Rfl:     oxyCODONE-acetaminophen (PERCOCET/ROXICET) 5-325 MG tablet, TAKE 1 TABLET BY MOUTH EVERY 8 HOURS AS NEEDED FOR PAIN. TAKE AS NEEDED FOR PAIN MANAGEMENT. (Patient not taking: Reported on 01/31/2022), Disp: , Rfl:    pregabalin (LYRICA) 100 MG capsule, Take 100 mg by mouth 3 (three) times daily., Disp: , Rfl:    pregabalin (LYRICA) 100 MG capsule, Take 1 capsule by mouth 3 (three) times daily., Disp: , Rfl:    promethazine (PHENERGAN) 25 MG tablet, Take 25 mg by mouth every 8 (eight) hours as needed., Disp: , Rfl:    promethazine (PHENERGAN) 25 MG tablet, Take 1 tablet by mouth every 8 (eight) hours as needed.,  Disp: , Rfl:    RABEprazole (ACIPHEX) 20 MG tablet, TAKE 1 TABLET BY MOUTH EVERY DAY, Disp: 90 tablet, Rfl: 4   rosuvastatin (CRESTOR) 20 MG tablet, TAKE 1 TABLET BY MOUTH EVERY DAY IN THE EVENING (Patient taking differently: Take 20 mg by mouth daily.), Disp: 90 tablet, Rfl: 4   senna (SENNA-TIME) 8.6 MG tablet, Take 1 tablet by mouth 2 (two) times daily., Disp: , Rfl:    sertraline (ZOLOFT) 100 MG tablet, Take 200 mg by mouth daily., Disp: , Rfl:    simethicone (MYLICON) 80 MG chewable tablet, Chew 80 mg by mouth every 6 (six) hours as needed for flatulence., Disp: , Rfl:    sulfamethoxazole-trimethoprim (BACTRIM DS) 800-160 MG tablet, Take 1 tablet every 12 hours by oral route for 7 days. (Patient not taking: Reported on 01/31/2022), Disp: , Rfl:    tirzepatide (MOUNJARO) 5 MG/0.5ML Pen, Inject 5 mg into the skin once a week., Disp: 2 mL, Rfl: 3   traMADol (ULTRAM) 50 MG tablet, TAKE 1 TABLET BY MOUTH EVERY 8 HOURS FOR 5 DAYS, Disp: , Rfl:    traZODone (DESYREL) 50 MG tablet, Take 50-100 mg by mouth at bedtime as needed for sleep. , Disp: , Rfl:    valACYclovir (VALTREX) 1000 MG tablet, Take 1 tablet daily for 5 days as needed, Disp: 30 tablet, Rfl: 3   Allergies  Allergen Reactions   Doxycycline Other (See Comments)    Emotional changes/anxious   Biaxin [Clarithromycin]     GI upset, and  bad taste in mouth.    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     Past Surgical History:  Procedure Laterality Date   ANTERIOR CERVICAL DECOMP/DISCECTOMY FUSION N/A 11/15/2020   Procedure: ANTERIOR CERVICAL DECOMPRESSION/DISCECTOMY FUSION 1 LEVEL C3/4;  Surgeon: Deetta Perla, MD;  Location: [facility];  Service: Neurosurgery;  Laterality: N/A;   AORTIC VALVE REPLACEMENT N/A 09/19/2021   Procedure: AORTIC VALVE REPLACEMENT Using 110m Edwards Resilia Inspiris Valve;  Surgeon: BGaye Pollack MD;  [place]  Service: Open Heart Surgery;  Laterality: N/A;   AUGMENTATION MAMMAPLASTY Bilateral    breast implants   Bone Spur  2007   foot   CARDIAC CATHETERIZATION     Carpal Tunnel Symdrome  2008   as repeated in 2009   CATARACT EXTRACTION W/PHACO Left 01/16/2019   Procedure: CATARACT EXTRACTION PHACO AND INTRAOCULAR LENS PLACEMENT ([place] LEFT DIABETES VISION BLUE;  Surgeon: HMarchia Meiers MD;  Location: [facility];  Service: Ophthalmology;  Laterality: Left;  [place] 00:47 CDE 6.62 Fluid pack lot # [id number]   CATARACT EXTRACTION W/PHACO Right 02/06/2019   Procedure: CATARACT EXTRACTION PHACO AND INTRAOCULAR LENS PLACEMENT (IOC);  Surgeon: HMarchia Meiers MD;  Location: [facility];  Service: Ophthalmology;  Laterality: Right;  [place]00:54.6 CDE 6.52 FLUID PACK LOT # [id number]   CHOLECYSTECTOMY  2007   COLONOSCOPY WITH PROPOFOL N/A 11/17/2015   Procedure: COLONOSCOPY WITH PROPOFOL;  Surgeon: RManya Silvas MD;  [facility]  Service: Endoscopy;  Laterality: N/A;  CORONARY ARTERY BYPASS GRAFT N/A 09/19/2021   Procedure:  CORONARY ARTERY BYPASS GRAFTING X1 Using Left Internal Mammory artery;  Surgeon: Gaye Pollack, MD;  Location: [place];  Service: Open Heart Surgery;  Laterality: N/A;   ESOPHAGOGASTRODUODENOSCOPY (EGD) WITH PROPOFOL N/A 11/17/2015   Procedure: ESOPHAGOGASTRODUODENOSCOPY (EGD) WITH PROPOFOL;  Surgeon: Manya Silvas, MD;  [facility];  Service: Endoscopy;  Laterality: N/A;   EYE SURGERY Bilateral    cataract extraction   FOOT SURGERY     INTRAVASCULAR PRESSURE WIRE/FFR STUDY N/A 05/24/2021   Procedure: INTRAVASCULAR PRESSURE WIRE/FFR STUDY;  Surgeon: Yolonda Kida, MD;  Location: [place] [facility];  Service: Cardiovascular;  Laterality: N/A;   MANDIBLE SURGERY     NASAL SINUS SURGERY  03/2013   Multile surgery Dr. Carlis Abbott. 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 Tamala Julian   RIGHT/LEFT HEART CATH AND CORONARY ANGIOGRAPHY N/A 05/24/2021   Procedure: RIGHT/LEFT HEART CATH AND CORONARY ANGIOGRAPHY;  Surgeon: Yolonda Kida, MD;  Location: [place] [facility];  Service: Cardiovascular;  Laterality: N/A;   TEE WITHOUT CARDIOVERSION N/A 09/19/2021   Procedure: TRANSESOPHAGEAL ECHOCARDIOGRAM (TEE);  Surgeon: Gaye Pollack, MD;  Location: [place];  Service: Open Heart Surgery;  Laterality: N/A;   TONSILLECTOMY  1960    Family History  Problem Relation Age of Onset   Diabetes Father    Lung cancer Mother    Breast cancer Maternal Aunt    Irritable bowel syndrome Sister    Colon cancer Brother     Social History   Tobacco Use   Smoking status: Never   Smokeless tobacco: Never  Vaping Use   Vaping Use: Never used  Substance Use Topics   Alcohol use: No   Drug use: No    ROS   Objective:   Vitals: BP 132/80   Pulse 71   Temp 97.9 F (36.6 C)   Resp 18   Ht 5' (1.524 m)   Wt 135 lb (61.2 kg)   SpO2 90%   BMI 26.37 kg/m   Physical Exam Constitutional:      General: She is not in acute distress.    Appearance:  Normal appearance. She is well-developed. She is not ill-appearing, toxic-appearing or diaphoretic.  HENT:     Head: Normocephalic and atraumatic.     Nose: Nose normal.     Mouth/Throat:     Mouth: Mucous membranes are moist.  Eyes:     General: No scleral icterus.       Right eye: No discharge.        Left eye: No discharge.     Extraocular Movements: Extraocular movements intact.     Conjunctiva/sclera: Conjunctivae normal.  Cardiovascular:     Rate and Rhythm: Normal rate.  Pulmonary:     Effort: Pulmonary effort is normal.  Abdominal:     General: Bowel sounds are normal. There is no distension.     Palpations: Abdomen is soft. There is no mass.     Tenderness: There is no abdominal tenderness. There is no right CVA tenderness, left CVA tenderness, guarding or rebound.  Skin:    General: Skin is warm and dry.  Neurological:     General: No focal deficit present.     Mental Status: She is alert and oriented to person, place, and time.  Psychiatric:        Mood and Affect: Mood normal.  Behavior: Behavior normal.        Thought Content: Thought content normal.        Judgment: Judgment normal.    Results for orders placed or performed during the hospital encounter of 03/03/22 (from the past 24 hour(s))  POCT CBG (manual entry)     Status: Abnormal   Collection Time: 03/03/22  7:12 PM  Result Value Ref Range   POCT Glucose ([facility]) 178 (A) 70 - 99 mg/dL    Assessment and Plan :   PDMP not reviewed this encounter.  1. Dysuria   2. Urinary urgency   3. Urinary frequency   4. Pain of female genitalia   5. Type 2 diabetes mellitus treated without insulin (Trevose)    Patient was unable to provide a urine sample despite drinking 32 ounces of water.  She states that she is able to urinate at home.  I suspect that patient is not hydrating very well at all.  Also due to her diabetes may have yeast vaginitis and therefore will cover for this.  Emphasized need to hydrate much  more consistently with plain water and avoid urinary irritants.  In the meantime use Pyridium.  If her symptoms persist especially dysuria, return to clinic for an attempt at a urinalysis and urine culture again.  Patient is otherwise hemodynamically stable and appropriate for outpatient management.  Counseled patient on potential for adverse effects with medications prescribed/recommended today, ER and return-to-clinic precautions discussed, patient verbalized understanding.     Jaynee Eagles, [name] 03/03/22 1933

## 2022-03-06 ENCOUNTER — Encounter: Payer: PPO | Admitting: Family

## 2022-03-07 ENCOUNTER — Encounter: Payer: PPO | Admitting: Family

## 2022-03-08 ENCOUNTER — Ambulatory Visit (INDEPENDENT_AMBULATORY_CARE_PROVIDER_SITE_OTHER): Payer: PPO | Admitting: Family

## 2022-03-08 ENCOUNTER — Encounter: Payer: Self-pay | Admitting: Family

## 2022-03-08 VITALS — BP 136/86 | HR 62 | Temp 98.7°F | Ht 60.0 in | Wt 138.0 lb

## 2022-03-08 DIAGNOSIS — Z23 Encounter for immunization: Secondary | ICD-10-CM | POA: Diagnosis not present

## 2022-03-08 DIAGNOSIS — E1140 Type 2 diabetes mellitus with diabetic neuropathy, unspecified: Secondary | ICD-10-CM

## 2022-03-08 DIAGNOSIS — F339 Major depressive disorder, recurrent, unspecified: Secondary | ICD-10-CM

## 2022-03-08 DIAGNOSIS — I10 Essential (primary) hypertension: Secondary | ICD-10-CM

## 2022-03-08 DIAGNOSIS — R7989 Other specified abnormal findings of blood chemistry: Secondary | ICD-10-CM | POA: Diagnosis not present

## 2022-03-08 DIAGNOSIS — D649 Anemia, unspecified: Secondary | ICD-10-CM | POA: Diagnosis not present

## 2022-03-08 DIAGNOSIS — I739 Peripheral vascular disease, unspecified: Secondary | ICD-10-CM

## 2022-03-08 DIAGNOSIS — I350 Nonrheumatic aortic (valve) stenosis: Secondary | ICD-10-CM | POA: Diagnosis not present

## 2022-03-08 DIAGNOSIS — F32A Depression, unspecified: Secondary | ICD-10-CM

## 2022-03-08 DIAGNOSIS — K529 Noninfective gastroenteritis and colitis, unspecified: Secondary | ICD-10-CM

## 2022-03-08 DIAGNOSIS — E782 Mixed hyperlipidemia: Secondary | ICD-10-CM

## 2022-03-08 DIAGNOSIS — R5383 Other fatigue: Secondary | ICD-10-CM

## 2022-03-08 DIAGNOSIS — I7025 Atherosclerosis of native arteries of other extremities with ulceration: Secondary | ICD-10-CM | POA: Insufficient documentation

## 2022-03-08 DIAGNOSIS — J309 Allergic rhinitis, unspecified: Secondary | ICD-10-CM

## 2022-03-08 DIAGNOSIS — G629 Polyneuropathy, unspecified: Secondary | ICD-10-CM

## 2022-03-08 DIAGNOSIS — I5033 Acute on chronic diastolic (congestive) heart failure: Secondary | ICD-10-CM

## 2022-03-08 DIAGNOSIS — F419 Anxiety disorder, unspecified: Secondary | ICD-10-CM

## 2022-03-08 DIAGNOSIS — R350 Frequency of micturition: Secondary | ICD-10-CM | POA: Diagnosis not present

## 2022-03-08 DIAGNOSIS — R1031 Right lower quadrant pain: Secondary | ICD-10-CM

## 2022-03-08 LAB — COMPREHENSIVE METABOLIC PANEL
ALT: 18 U/L (ref 0–35)
AST: 25 U/L (ref 0–37)
Albumin: 4.2 g/dL (ref 3.5–5.2)
Alkaline Phosphatase: 89 U/L (ref 39–117)
BUN: 15 mg/dL (ref 6–23)
CO2: 30 mEq/L (ref 19–32)
Calcium: 9.2 mg/dL (ref 8.4–10.5)
Chloride: 103 mEq/L (ref 96–112)
Creatinine, Ser: 0.71 mg/dL (ref 0.40–1.20)
GFR: 86.05 mL/min (ref >60.00)
Glucose, Bld: 146 mg/dL — ABNORMAL HIGH (ref 70–99)
Potassium: 4.3 mEq/L (ref 3.5–5.1)
Sodium: 139 mEq/L (ref 135–145)
Total Bilirubin: 0.7 mg/dL (ref 0.2–1.2)
Total Protein: 6.4 g/dL (ref 6.0–8.3)

## 2022-03-08 LAB — HEMOGLOBIN A1C: Hgb A1c MFr Bld: 6.1 % (ref 4.6–6.5)

## 2022-03-08 LAB — CBC
HCT: 27.1 % — ABNORMAL LOW (ref 36.0–46.0)
Hemoglobin: 8.7 g/dL — ABNORMAL LOW (ref 12.0–15.0)
MCHC: 32.3 g/dL (ref 30.0–36.0)
MCV: 78.1 fl (ref 78.0–100.0)
Platelets: 162 10*3/uL (ref 150.0–400.0)
RBC: 3.47 Mil/uL — ABNORMAL LOW (ref 3.87–5.11)
RDW: 18.8 % — ABNORMAL HIGH (ref 11.5–15.5)
WBC: 7 10*3/uL (ref 4.0–10.5)

## 2022-03-08 LAB — MAGNESIUM: Magnesium: 1.9 mg/dL (ref 1.5–2.5)

## 2022-03-08 LAB — B12 AND FOLATE PANEL
Folate: 18.2 ng/mL (ref >5.9)
Vitamin B-12: 513 pg/mL (ref 211–911)

## 2022-03-08 LAB — TSH: TSH: 1.51 u[IU]/mL (ref 0.35–5.50)

## 2022-03-08 LAB — MICROALBUMIN / CREATININE URINE RATIO
Creatinine,U: 68.7 mg/dL
Microalb Creat Ratio: 6.3 mg/g (ref 0.0–30.0)
Microalb, Ur: 4.3 mg/dL — ABNORMAL HIGH (ref 0.0–1.9)

## 2022-03-08 LAB — BRAIN NATRIURETIC PEPTIDE: Pro B Natriuretic peptide (BNP): 430 pg/mL — ABNORMAL HIGH (ref 0.0–100.0)

## 2022-03-08 MED ORDER — SERTRALINE HCL 100 MG PO TABS: 200.0000 mg | ORAL_TABLET | Freq: Every day | ORAL | Status: DC

## 2022-03-08 NOTE — Assessment & Plan Note (Signed)
I do suggest starting Allegra otc once daily as patient with effusion in right ear which might be contributing to dizziness

## 2022-03-08 NOTE — Patient Instructions (Addendum)
  Please bring your medication list with you to the next appointment so we can go over this together.    ------------------------------------  Restart allegra once daily for your allergies as it might help with your dizziness   ------------------------------------  Number one , you need to call GI to set up colonscopy, this is very important.    ------------------------------------     Welcome to our clinic, I am happy to have you as my new patient. I am excited to continue on this healthcare journey with you.  Stop by the lab prior to leaving today. I will notify you of your results once received.   Please keep in mind Any my chart messages you send have up to a three business day turnaround for a response.  Phone calls may take up to a one full business day turnaround for a  response.   If you need a medication refill I recommend you request it through the pharmacy as this is easiest for [place] rather than sending a message and or phone call.   Due to recent changes in healthcare laws, you may see results of your imaging and/or laboratory studies on MyChart before I have had a chance to review them.  I understand that in some cases there may be results that are confusing or concerning to you. Please understand that not all results are received at the same time and often I may need to interpret multiple results in order to provide you with the best plan of care or course of treatment. Therefore, I ask that you please give me 2 business days to thoroughly review all your results before contacting my office for clarification. Should we see a critical lab result, you will be contacted sooner.   It was a pleasure seeing you today! Please do not hesitate to reach out with any questions and or concerns.  Regards,   Eugenia Pancoast FNP-C

## 2022-03-08 NOTE — Assessment & Plan Note (Signed)
Continue lyrica as prescribed.

## 2022-03-08 NOTE — Assessment & Plan Note (Signed)
Advised patient to follow-up with cardiologist she does have history of pleural effusion and used to be on Lasix but states she is no longer taking.  Upon review of her BNP it is elevated in the 500s.  I do feel as though she still does have congestive heart failure.  Patient to make a follow-up appointment with cardiology.

## 2022-03-08 NOTE — Assessment & Plan Note (Signed)
Continue f/u with psychology and psychiatry as scheduled.  Cont sertraline 200 mg as prescribed.

## 2022-03-08 NOTE — Assessment & Plan Note (Signed)
Ordered hga1c today pending results. Work on diabetic diet and exercise as tolerated. Yearly foot exam, and annual eye exam.   

## 2022-03-08 NOTE — Assessment & Plan Note (Signed)
Ordered lipid panel, pending results. Work on low cholesterol diet and exercise as tolerated ? ?

## 2022-03-08 NOTE — Progress Notes (Signed)
New Patient Office Visit  Subjective:  Patient ID: Katie Buckley, female    DOB: 1951/01/23  Age: 71 y.o. MRN: [id number]  CC:  Chief Complaint  Patient presents with   transfer of care     HPI [place] is here to establish care as a new patient.  Prior provider was: Dr. Caryn Section  Pt is without acute concerns.   Two weeks ago with dysuria, urinary frequency and pressure. Still here and there, not as frequent.   Concerned with weight loss, about twenty five pounds weight loss in the last six months or so.  Abdominal pain ongoing for some time. Did have CTA 1/23 and was advised to make colonoscopy appt but states she has been unable to schedule as she has been very busy. She does report diarrhea as well, a few times a day at times when it does occur. No blood seen in the stool.  Wt Readings from Last 3 Encounters:  03/08/22 138 lb (62.6 kg)  03/03/22 135 lb (61.2 kg)  01/31/22 125 lb (56.7 kg)   chronic concerns:  DM2: not taking jardiance 10 mg once daily. This was too expensive for her. Last eye exam last year, has appt in about two weeks with opthamologist with Dr. Edison Pace.   Facial movements: was on abilify in the past which likely caused this issue. She is seeing neurologist, he has suggested possible botox.   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 She is to f/u with him scheduled for 10/2 330 pm.   Depression: does follow with psychiatry as well as psychology. Taking sertraline.   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    Past  Surgical History:  Procedure Laterality Date   ANTERIOR CERVICAL DECOMP/DISCECTOMY FUSION N/A 11/15/2020   Procedure: ANTERIOR CERVICAL DECOMPRESSION/DISCECTOMY FUSION 1 LEVEL C3/4;  Surgeon: Deetta Perla, MD;  Location: [facility];  Service: Neurosurgery;  Laterality: N/A;   AORTIC VALVE REPLACEMENT N/A 09/19/2021   Procedure: AORTIC VALVE REPLACEMENT Using 40mm Edwards Resilia Inspiris Valve;  Surgeon: Gaye Pollack, MD;  Location: [place];  Service: Open Heart Surgery;  Laterality: N/A;   AUGMENTATION MAMMAPLASTY Bilateral    breast implants   Bone Spur  2007   foot   CARDIAC CATHETERIZATION     Carpal Tunnel Symdrome  2008   as repeated in 2009   CATARACT EXTRACTION W/PHACO Left 01/16/2019   Procedure: CATARACT EXTRACTION PHACO AND INTRAOCULAR LENS PLACEMENT ([place]) LEFT DIABETES VISION BLUE;  Surgeon: Marchia Meiers, MD;  Location: [facility];  Service: Ophthalmology;  Laterality: Left;  [place]  00:47 CDE 6.62 Fluid pack lot # [id number]   CATARACT EXTRACTION W/PHACO Right 02/06/2019   Procedure: CATARACT EXTRACTION PHACO AND INTRAOCULAR LENS PLACEMENT ([facility]);  Surgeon: Marchia Meiers, MD;  Location: [facility];  Service: Ophthalmology;  Laterality: Right;  [place] 00:54.6 CDE 6.52 FLUID PACK LOT # [id number]   CHOLECYSTECTOMY  2007   COLONOSCOPY WITH PROPOFOL N/A 11/17/2015   Procedure: COLONOSCOPY WITH  PROPOFOL;  Surgeon: Manya Silvas, MD;  Location: [facility];  Service: Endoscopy;  Laterality: N/A;   CORONARY ARTERY BYPASS GRAFT N/A 09/19/2021   Procedure: CORONARY ARTERY BYPASS GRAFTING X1 Using Left Internal Mammory artery;  Surgeon: Gaye Pollack, MD;  Location: [place];  Service: Open Heart Surgery;  Laterality: N/A;   ESOPHAGOGASTRODUODENOSCOPY (EGD) WITH PROPOFOL N/A 11/17/2015   Procedure: ESOPHAGOGASTRODUODENOSCOPY (EGD) WITH PROPOFOL;  Surgeon: Manya Silvas, MD;  Location: [facility] ENDOSCOPY;  Service: Endoscopy;  Laterality: N/A;   INTRAVASCULAR PRESSURE WIRE/FFR STUDY N/A 05/24/2021    Procedure: INTRAVASCULAR PRESSURE WIRE/FFR STUDY;  Surgeon: Yolonda Kida, MD;  Location: [place] [facility];  Service: Cardiovascular;  Laterality: N/A;   MANDIBLE SURGERY     NASAL SINUS SURGERY  03/2013   Multile surgery Dr. Carlis Abbott. 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 Tamala Julian   RIGHT/LEFT HEART CATH AND CORONARY ANGIOGRAPHY N/A 05/24/2021   Procedure: RIGHT/LEFT HEART CATH AND CORONARY ANGIOGRAPHY;  Surgeon: Yolonda Kida, MD;  Location: [place] [facility];  Service: Cardiovascular;  Laterality: N/A;   TEE WITHOUT CARDIOVERSION N/A 09/19/2021   Procedure: TRANSESOPHAGEAL ECHOCARDIOGRAM (TEE);  Surgeon: Gaye Pollack, MD;  Location: [place];  Service: Open Heart Surgery;  Laterality: N/A;   TONSILLECTOMY  1960    Family History  Problem Relation Age of Onset   Diabetes Father    Lung cancer Mother    Breast cancer Maternal Aunt    Irritable bowel syndrome Sister    Colon cancer Brother     Social History   Socioeconomic History   Marital status: Married    Spouse name: [name], husband   Number of children: 1   Years of education: Not on file   Highest education level: Not on file  Occupational History    Comment: retired    Fish farm manager: RETIRED  Tobacco Use   Smoking status: Never   Smokeless tobacco: Never  Vaping Use   Vaping Use: Never used  Substance and Sexual Activity   Alcohol use: No   Drug use: No   Sexual activity: Not Currently    Partners: Male    Birth control/protection: Post-menopausal  Other Topics Concern   Not on file  Social History Narrative   Not on file   Social Determinants of Health   Financial Resource Strain: Low Risk  (01/31/2022)   Overall Financial Resource Strain (CARDIA)    Difficulty of Paying Living Expenses: Not hard at all  Food Insecurity: No Food Insecurity (01/31/2022)   Hunger Vital Sign    Worried About Running Out of Food in the Last Year: Never true    [place] in the Last Year: Never true  Transportation Needs: No Transportation Needs (01/31/2022)   PRAPARE - Transportation    Lack of Transportation (Medical): No    Lack of Transportation (Non-Medical): No  Physical Activity: Inactive (01/31/2022)   Exercise Vital Sign    Days of Exercise per Week: 0 days    Minutes of Exercise per Session: 0 min  Stress: Stress Concern Present (01/31/2022)   [place]    Feeling of Stress : To some extent  Social Connections: Moderately Isolated (01/31/2022)   Social Connection and Isolation Panel [NHANES]    Frequency of Communication with Friends and Family: Twice a week    Frequency of Social Gatherings with Friends and Family: Once a week  Attends Religious Services: Never    Active Member of Clubs or Organizations: No    Attends Archivist Meetings: Never    Marital Status: Married  Human resources officer Violence: Not At Risk (01/31/2022)   Humiliation, Afraid, Rape, and Kick questionnaire    Fear of Current or Ex-Partner: No    Emotionally Abused: No    Physically Abused: No    Sexually Abused: No    Outpatient Medications Prior to Visit  Medication Sig Dispense Refill   acetaminophen (TYLENOL) 650 MG CR tablet Take 1,300 mg by mouth every 8 (eight) hours as needed for pain (pain).     amLODipine (NORVASC) 10 MG tablet Take 1 tablet (10 mg total) by mouth daily. 30 tablet 2   apixaban (ELIQUIS) 5 MG TABS tablet Take 1 tablet (5 mg total) by mouth 2 (two) times daily. 60 tablet 2   aspirin EC 81 MG tablet Take 1 tablet (81 mg total) by mouth daily. Swallow whole.     atorvastatin (LIPITOR) 80 MG tablet      buPROPion (WELLBUTRIN SR) 150 MG 12 hr tablet Take 150 mg by mouth 2 (two) times daily.     butalbital-acetaminophen-caffeine (FIORICET) 50-325-40 MG tablet TAKE 1 TABLET BY MOUTH EVERY 6 HOURS AS NEEDED FOR HEADACHE (Patient taking differently: Take 1 tablet by mouth  every 6 (six) hours as needed for headache.) 30 tablet 4   ezetimibe (ZETIA) 10 MG tablet TAKE 1 TABLET BY MOUTH EVERYDAY AT BEDTIME (Patient taking differently: Take 10 mg by mouth at bedtime.) 90 tablet 0   fexofenadine (ALLEGRA) 180 MG tablet Take 180 mg by mouth daily as needed for allergies or rhinitis.     glipiZIDE (GLUCOTROL) 10 MG tablet TAKE 1 TABLET BY MOUTH EVERY DAY BEFORE BREAKFAST (Patient taking differently: Take 10 mg by mouth daily before breakfast.) 90 tablet 4   hydrOXYzine (VISTARIL) 25 MG capsule Take 25 mg by mouth daily as needed for anxiety.     irbesartan (AVAPRO) 75 MG tablet TAKE 1 TABLET BY MOUTH EVERY DAY 90 tablet 4   Lancet Devices MISC      loperamide (IMODIUM) 2 MG capsule Take 2-4 mg by mouth daily as needed for diarrhea or loose stools.     LYRICA 25 MG capsule Take 25 mg by mouth in the morning, at noon, and at bedtime.     metFORMIN (GLUCOPHAGE-XR) 500 MG 24 hr tablet TAKE 2 TABLETS BY MOUTH EVERY DAY 180 tablet 3   metoprolol tartrate (LOPRESSOR) 50 MG tablet Take 1 tablet (50 mg total) by mouth 2 (two) times daily. 60 tablet 2   montelukast (SINGULAIR) 10 MG tablet Take 1 tablet (10 mg total) by mouth at bedtime as needed (allergies). Home med.     Olopatadine HCl 0.7 % SOLN Place 1 drop into both eyes daily as needed (allergies).     ondansetron (ZOFRAN ODT) 4 MG disintegrating tablet Take 1 tablet (4 mg total) by mouth every 8 (eight) hours as needed for nausea or vomiting. 20 tablet 0   promethazine (PHENERGAN) 25 MG tablet Take 25 mg by mouth every 8 (eight) hours as needed.     RABEprazole (ACIPHEX) 20 MG tablet TAKE 1 TABLET BY MOUTH EVERY DAY 90 tablet 4   simethicone (MYLICON) 80 MG chewable tablet Chew 80 mg by mouth every 6 (six) hours as needed for flatulence.     amiodarone (PACERONE) 200 MG tablet Take 1 tablet (200 mg total) by mouth daily. Home med.  60 tablet 1   amLODipine (NORVASC) 10 MG tablet Take 1 tablet by mouth daily.      amoxicillin-clavulanate (AUGMENTIN) 875-125 MG tablet Take 1 tablet by mouth 2 (two) times daily.     ARIPiprazole (ABILIFY) 5 MG tablet      Armodafinil 250 MG tablet      aspirin EC 325 MG tablet Take 1 tablet by mouth daily.     baclofen (LIORESAL) 10 MG tablet TAKE 1 TABLET (10 MG TOTAL) BY MOUTH 2 (TWO) TIMES DAILY AS NEEDED FOR UP TO 30 DAYS     buPROPion ER (WELLBUTRIN SR) 100 MG 12 hr tablet TAKE 1 TWICE DAILY AT 8AM AND 1PM     empagliflozin (JARDIANCE) 10 MG TABS tablet Take 1 tablet by mouth daily.     fluconazole (DIFLUCAN) 150 MG tablet Take 1 tablet (150 mg total) by mouth every 3 (three) days. 2 tablet 0   furosemide (LASIX) 40 MG tablet TAKE 1 TABLET (40 MG TOTAL) BY MOUTH DAILY. FOR 5 DAYS THEN STOP.     hydrOXYzine (ATARAX) 50 MG tablet TAKE 1 TWICE DAILY AS NEEDED     hydrOXYzine (VISTARIL) 50 MG capsule TAKE 1 CAPSULE BY MOUTH TWICE A DAY AS NEEDED     JARDIANCE 25 MG TABS tablet TAKE 1 TABLET (25 MG TOTAL) BY MOUTH DAILY. 90 tablet 4   levofloxacin (LEVAQUIN) 750 MG tablet TAKE 1 TABLET BY MOUTH DAILY FOR 4 DAYS.     methocarbamol (ROBAXIN) 500 MG tablet Take 1 tablet (500 mg total) by mouth every 6 (six) hours as needed for muscle spasms. 60 tablet 0   metoprolol-hydrochlorothiazide (LOPRESSOR HCT) 100-25 MG tablet Take 0.5 tablets by mouth daily.     metroNIDAZOLE (FLAGYL) 500 MG tablet Take 1 tablet by mouth 3 (three) times daily.     montelukast (SINGULAIR) 10 MG tablet Take 1 tablet by mouth daily.     nortriptyline (PAMELOR) 10 MG capsule      oxyCODONE (OXY IR/ROXICODONE) 5 MG immediate release tablet TAKE 1 TABLET BY MOUTH EVERY 6 (SIX) HOURS AS NEEDED FOR MODERATE PAIN ((SCORE 4 TO 6)).     oxyCODONE-acetaminophen (PERCOCET/ROXICET) 5-325 MG tablet      phenazopyridine (PYRIDIUM) 200 MG tablet Take 1 tablet (200 mg total) by mouth 3 (three) times daily as needed for pain. 10 tablet 0   pregabalin (LYRICA) 100 MG capsule Take 100 mg by mouth 3 (three) times daily.      pregabalin (LYRICA) 100 MG capsule Take 1 capsule by mouth 3 (three) times daily.     promethazine (PHENERGAN) 25 MG tablet Take 1 tablet by mouth every 8 (eight) hours as needed.     rosuvastatin (CRESTOR) 20 MG tablet TAKE 1 TABLET BY MOUTH EVERY DAY IN THE EVENING 90 tablet 4   senna (SENNA-TIME) 8.6 MG tablet Take 1 tablet by mouth 2 (two) times daily.     sertraline (ZOLOFT) 100 MG tablet Take 200 mg by mouth daily.     sulfamethoxazole-trimethoprim (BACTRIM DS) 800-160 MG tablet Take 1 tablet every 12 hours by oral route for 7 days.     tirzepatide [facility]) 5 MG/0.5ML Pen Inject 5 mg into the skin once a week. 2 mL 3   traMADol (ULTRAM) 50 MG tablet TAKE 1 TABLET BY MOUTH EVERY 8 HOURS FOR 5 DAYS     traZODone (DESYREL) 50 MG tablet Take 50-100 mg by mouth at bedtime as needed for sleep.      valACYclovir (VALTREX)  1000 MG tablet Take 1 tablet daily for 5 days as needed 30 tablet 3   No facility-administered medications prior to visit.    Allergies  Allergen Reactions   Doxycycline Other (See Comments)    Emotional changes/anxious   Biaxin [Clarithromycin]     GI upset, and bad taste in mouth.    ROS    Objective:    Physical Exam Constitutional:      General: She is not in acute distress.    Appearance: Normal appearance. She is not ill-appearing.  HENT:     Right Ear: Tympanic membrane normal.     Left Ear: Tympanic membrane normal.     Nose: Nose normal. No congestion or rhinorrhea.     Right Turbinates: Not enlarged or swollen.     Left Turbinates: Not enlarged or swollen.     Right Sinus: No maxillary sinus tenderness or frontal sinus tenderness.     Left Sinus: No maxillary sinus tenderness or frontal sinus tenderness.     Mouth/Throat:     Mouth: Mucous membranes are moist.     Pharynx: No pharyngeal swelling, oropharyngeal exudate or posterior oropharyngeal erythema.     Tonsils: No tonsillar exudate.  Eyes:     Extraocular Movements: Extraocular  movements intact.     Conjunctiva/sclera: Conjunctivae normal.     Pupils: Pupils are equal, round, and reactive to light.  Neck:     Thyroid: No thyroid mass.  Cardiovascular:     Rate and Rhythm: Normal rate and regular rhythm.     Heart sounds: Murmur heard.  Pulmonary:     Effort: Pulmonary effort is normal.     Breath sounds: Normal breath sounds.  Abdominal:     General: Abdomen is flat.     Tenderness: There is abdominal tenderness (moderate ruq and lower right quadrant, mild pain luq).  Lymphadenopathy:     Cervical:     Right cervical: No superficial cervical adenopathy.    Left cervical: No superficial cervical adenopathy.  Neurological:     Mental Status: She is alert.      BP 136/86   Pulse 62   Temp 98.7 F (37.1 C) (Oral)   Ht 5' (1.524 m)   Wt 138 lb (62.6 kg)   SpO2 95%   BMI 26.95 kg/m  Wt Readings from Last 3 Encounters:  03/08/22 138 lb (62.6 kg)  03/03/22 135 lb (61.2 kg)  01/31/22 125 lb (56.7 kg)     Health Maintenance Due  Topic Date Due   FOOT EXAM  Never done   Zoster Vaccines- Shingrix (1 of 2) Never done   DEXA SCAN  Never done   TETANUS/TDAP  10/08/2017   COVID-19 Vaccine (3 - Pfizer risk series) 09/24/2019   COLONOSCOPY (Pts 45-9yrs Insurance coverage will need to be confirmed)  11/16/2020   OPHTHALMOLOGY EXAM  06/30/2021    There are no preventive care reminders to display for this patient.  Lab Results  Component Value Date   TSH 1.650 09/01/2020   Lab Results  Component Value Date   WBC 17.9 (H) 11/02/2021   HGB 10.3 (L) 11/02/2021   HCT 32.4 (L) 11/02/2021   MCV 77.0 (L) 11/02/2021   PLT 496 (H) 11/02/2021   Lab Results  Component Value Date   NA 137 11/02/2021   K 3.5 11/02/2021   CO2 28 11/02/2021   GLUCOSE 97 11/02/2021   BUN 29 (H) 11/02/2021   CREATININE 0.67 11/02/2021   BILITOT 0.5 10/25/2021  ALKPHOS 99 10/25/2021   AST 28 10/25/2021   ALT 17 10/25/2021   PROT 7.0 10/25/2021   ALBUMIN 3.8  10/25/2021   CALCIUM 8.9 11/02/2021   ANIONGAP 10 11/02/2021   EGFR 75 02/09/2021   Lab Results  Component Value Date   CHOL 87 09/21/2021   Lab Results  Component Value Date   HDL 42 09/21/2021   Lab Results  Component Value Date   LDLCALC 21 09/21/2021   Lab Results  Component Value Date   TRIG 122 09/21/2021   Lab Results  Component Value Date   CHOLHDL 2.1 09/21/2021   Lab Results  Component Value Date   HGBA1C 8.0 (H) 09/15/2021      Assessment & Plan:   Problem List Items Addressed This Visit       Cardiovascular and Mediastinum   Acute on chronic heart failure with preserved ejection fraction (HFpEF) ([place])    Advised patient to follow-up with cardiologist she does have history of pleural effusion and used to be on Lasix but states she is no longer taking.  Upon review of her BNP it is elevated in the 500s.  I do feel as though she still does have congestive heart failure.  Patient to make a follow-up appointment with cardiology.      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  They have been trying to reach her from GI to schedule colonoscopy to which patient has been noncompliant.  Advised patient to call and follow-up to schedule colonoscopy as they are trying to rule out colitis versus a neoplasm.  With ongoing weight loss this is not the outside of the picture.        Endocrine   Diabetes mellitus with neuropathy ([place]) - Primary    Ordered hga1c today pending results. Work on diabetic diet and exercise as tolerated. Yearly foot exam, and annual eye exam.         Relevant Orders   Hemoglobin A1c   Microalbumin / creatinine urine ratio     Nervous and Auditory   Neuropathy    Continue lyrica as prescribed.         Other   Normocytic anemia    Check cbc today pending results.  Ordering fobt      Depression    Continue follow-up with psychologist and psychiatrist as scheduled check       Relevant Medications   sertraline (ZOLOFT) 100 MG tablet   Hyperlipidemia, mixed    Ordered lipid panel, pending results. Work on low cholesterol diet and exercise as tolerated       Anxiety disorder    Continue f/u with psychology and psychiatry as scheduled.  Cont sertraline 200 mg as prescribed.        Relevant Medications   sertraline (ZOLOFT) 100 MG tablet   Depression, recurrent (HCC)   Relevant Medications   sertraline (ZOLOFT) 100 MG tablet   Other Relevant Orders   B12 and Folate Panel   TSH   Elevated brain natriuretic peptide (BNP) level  Relevant Orders   Brain natriuretic peptide   Hypomagnesemia   Relevant Orders   Magnesium   Need for immunization against influenza    Influenza  vaccine administered in office Pt tolerated procedure well  Verbal consent obtained prior to administration  Handout given in regards to vaccination.   Extensive time spent in the clinic totaling 65 minutes discussing current and acute concerns as well as reviewing past notes to include note from neurology from [name] as well as reviewing old CTA for imaging reports.       Relevant Orders   Flu Vaccine QUAD High Dose(Fluad) (Completed)   Other Visit Diagnoses     Mixed hyperlipidemia       Anemia, unspecified type       Other fatigue       Relevant Orders   CBC   Comprehensive metabolic panel   O50 and Folate Panel   TSH   Urinary frequency       Relevant Orders   Urine Culture   Right lower quadrant abdominal pain       Relevant Orders   Fecal occult blood, imunochemical       Meds ordered this encounter  Medications    sertraline (ZOLOFT) 100 MG tablet    Sig: Take 2 tablets (200 mg total) by mouth daily.    Order Specific Question:   Supervising Provider    Answer:   Eliezer Lofts E [[id number]]    Follow-up: Return in about 2 weeks (around 03/22/2022) for f/u diabetes.    Eugenia Pancoast, FNP

## 2022-03-08 NOTE — Assessment & Plan Note (Addendum)
Influenza  vaccine administered in office Pt tolerated procedure well  Verbal consent obtained prior to administration  Handout given in regards to vaccination.   Extensive time spent in the clinic totaling 65 minutes discussing current and acute concerns as well as reviewing past notes to include note from neurology from [name] as well as reviewing old CTA for imaging reports.

## 2022-03-08 NOTE — Assessment & Plan Note (Signed)
Reviewed CTA from January 2023.  They have been trying to reach her from GI to schedule colonoscopy to which patient has been noncompliant.  Advised patient to call and follow-up to schedule colonoscopy as they are trying to rule out colitis versus a neoplasm.  With ongoing weight loss this is not the outside of the picture.

## 2022-03-08 NOTE — Assessment & Plan Note (Signed)
Check cbc today pending results.  Ordering fobt

## 2022-03-08 NOTE — Assessment & Plan Note (Signed)
Continue medication as prescribed.

## 2022-03-08 NOTE — Assessment & Plan Note (Signed)
Continue follow-up with psychologist and psychiatrist as scheduled check

## 2022-03-09 NOTE — Progress Notes (Signed)
Please have patient make appointment to come in to discuss lab work . also advised patient that she has lost some blood in the last 4 months with worsening anemia, does she have any blood in the stool  she sees?  Have her make sure that she brings in the I fob that she took home last time to make sure there is no blood in the stool.

## 2022-03-10 ENCOUNTER — Telehealth: Payer: Self-pay | Admitting: Family

## 2022-03-10 ENCOUNTER — Ambulatory Visit (INDEPENDENT_AMBULATORY_CARE_PROVIDER_SITE_OTHER): Payer: PPO | Admitting: Family

## 2022-03-10 ENCOUNTER — Ambulatory Visit
Admission: RE | Admit: 2022-03-10 | Discharge: 2022-03-10 | Disposition: A | Discharge status: Home / Self Care | Payer: PPO | Source: Ambulatory Visit | Attending: Family | Admitting: Family

## 2022-03-10 VITALS — BP 136/68 | HR 77 | Temp 98.6°F | Resp 16 | Ht 60.0 in | Wt 137.2 lb

## 2022-03-10 DIAGNOSIS — R109 Unspecified abdominal pain: Secondary | ICD-10-CM | POA: Diagnosis not present

## 2022-03-10 DIAGNOSIS — R1031 Right lower quadrant pain: Secondary | ICD-10-CM | POA: Insufficient documentation

## 2022-03-10 DIAGNOSIS — R1011 Right upper quadrant pain: Secondary | ICD-10-CM | POA: Diagnosis not present

## 2022-03-10 DIAGNOSIS — R809 Proteinuria, unspecified: Secondary | ICD-10-CM | POA: Diagnosis not present

## 2022-03-10 DIAGNOSIS — E1140 Type 2 diabetes mellitus with diabetic neuropathy, unspecified: Secondary | ICD-10-CM | POA: Diagnosis not present

## 2022-03-10 DIAGNOSIS — D649 Anemia, unspecified: Secondary | ICD-10-CM | POA: Insufficient documentation

## 2022-03-10 DIAGNOSIS — R0609 Other forms of dyspnea: Secondary | ICD-10-CM | POA: Insufficient documentation

## 2022-03-10 DIAGNOSIS — I700 Atherosclerosis of aorta: Secondary | ICD-10-CM | POA: Diagnosis not present

## 2022-03-10 DIAGNOSIS — R5383 Other fatigue: Secondary | ICD-10-CM

## 2022-03-10 LAB — URINE CULTURE
MICRO NUMBER:: 13975632
SPECIMEN QUALITY:: ADEQUATE

## 2022-03-10 MED ORDER — IOHEXOL 300 MG/ML  SOLN
100.0000 mL | Freq: Once | INTRAMUSCULAR | Status: AC | PRN
  Administered 2022-03-10: 100 mL via INTRAVENOUS

## 2022-03-10 MED ORDER — GLIPIZIDE 5 MG PO TABS: 5.0000 mg | ORAL_TABLET | Freq: Every day | ORAL | 2 refills | Status: DC

## 2022-03-10 MED ORDER — IOHEXOL 300 MG/ML  SOLN: 100.0000 mL | Freq: Once | INTRAMUSCULAR | Status: DC | PRN

## 2022-03-10 NOTE — Assessment & Plan Note (Signed)
Decrease glipizide to 5 mg once daily  Work on diabetic diet exercise as tolerated Continue metformin as prescribed.

## 2022-03-10 NOTE — Progress Notes (Signed)
Ct did not come back with any acute findings.  Appendix appears normal.  There is some plaquing of arteries so it is very important we work on a low cholesterol diet in the future and watch cholesterol in diet. Will await results of IFOB (blood test for stool). Please ensure pt went home with IFOB today.

## 2022-03-10 NOTE — Assessment & Plan Note (Signed)
Suspected due to anemia

## 2022-03-10 NOTE — Assessment & Plan Note (Signed)
D/w pt compliance with irbesartan however did advise pt with new anemia to monitor blood pressure to make sure not hypotensive. Today ok at 136/68

## 2022-03-10 NOTE — Telephone Encounter (Signed)
Pt called returning a missed call, call back # is [phone]

## 2022-03-10 NOTE — Assessment & Plan Note (Signed)
Worsening anemia pt on eliquis Urgent referral to hematology and GI.  Trying to get pt worked in for colonoscopy for pt, reached out to [place] at GI office they are going to reach out to pt to get scheduled.   Did advise pt if any worsening sob, fatigue,  Or blood loss visualized in copious amounts go to ER or call 911.

## 2022-03-10 NOTE — Progress Notes (Unsigned)
Established Patient Office Visit  Subjective:  Patient ID: Katie Buckley, female    DOB: 1951/02/07  Age: 71 y.o. MRN: [id number]  CC:  Chief Complaint  Patient presents with   Abnormal Lab    HPI Katie Buckley is here today for follow up.    Pt is with acute concerns.  Worsening anemia: denies known blood in stool. Denies bloody noses on a regular basis.  On eliquis 5 mg.   Positive urine microalbumin: pt taking irbesartan but does admit doesn't take daily. she will work on this.   Pt does have diarrhea quite often, not always daily but once a week. As discussed at visit prior, she had reported 25 pound weight loss. She doesn't see blood herself in the stool. She has not yet completed the fob but will do so today. She has tried to call GI but states unable to reach them yet, just called two days ago.   She has noticed in the last few weeks when she walks after a few minutes she feels as though she has walked a mile or so. Her knees start burning and her legs get weak and she has to stop and catch her breath and start over.   She does report abdominal fullness even before she has eaten. Worse upper than lower but feels in both places.   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    Past Surgical History:  Procedure Laterality Date   ANTERIOR CERVICAL DECOMP/DISCECTOMY FUSION N/A 11/15/2020   Procedure: ANTERIOR CERVICAL DECOMPRESSION/DISCECTOMY FUSION 1 LEVEL C3/4;  Surgeon: Deetta Perla, MD;  Location: [facility];  Service: Neurosurgery;  Laterality: N/A;   AORTIC VALVE REPLACEMENT N/A 09/19/2021    Procedure: AORTIC VALVE REPLACEMENT Using 69mm Edwards Resilia Inspiris Valve;  Surgeon: Gaye Pollack, MD;  Location: [place];  Service: Open Heart Surgery;  Laterality: N/A;   AUGMENTATION MAMMAPLASTY Bilateral    breast implants   Bone Spur  2007   foot   CARDIAC CATHETERIZATION     Carpal Tunnel Symdrome  2008   as repeated in 2009   CATARACT EXTRACTION W/PHACO Left 01/16/2019   Procedure: CATARACT EXTRACTION PHACO AND INTRAOCULAR LENS PLACEMENT ([place]) LEFT DIABETES VISION BLUE;  Surgeon: Marchia Meiers, MD;  Location: [facility];  Service: Ophthalmology;  Laterality: Left;  [place]  00:47 CDE 6.62 Fluid pack lot # [id number]   CATARACT EXTRACTION W/PHACO Right 02/06/2019   Procedure: CATARACT EXTRACTION PHACO AND INTRAOCULAR LENS PLACEMENT ([facility]);  Surgeon: Marchia Meiers, MD;  Location: [facility];  Service: Ophthalmology;  Laterality: Right;  [place] 00:54.6 CDE 6.52 FLUID PACK LOT # [id number]   CHOLECYSTECTOMY  2007   COLONOSCOPY WITH PROPOFOL N/A 11/17/2015   Procedure: COLONOSCOPY WITH PROPOFOL;  Surgeon: Manya Silvas, MD;  [facility];  Service: Endoscopy;  Laterality: N/A;   CORONARY ARTERY BYPASS GRAFT N/A 09/19/2021   Procedure: CORONARY ARTERY BYPASS GRAFTING X1 Using Left Internal Mammory artery;  Surgeon: Gaye Pollack, MD;  Location: [place];  Service: Open Heart  Surgery;  Laterality: N/A;   ESOPHAGOGASTRODUODENOSCOPY (EGD) WITH PROPOFOL N/A 11/17/2015   Procedure: ESOPHAGOGASTRODUODENOSCOPY (EGD) WITH PROPOFOL;  Surgeon: Manya Silvas, MD;  Location[facility];  Service: Endoscopy;  Laterality: N/A;   INTRAVASCULAR PRESSURE WIRE/FFR STUDY N/A 05/24/2021   Procedure: INTRAVASCULAR PRESSURE WIRE/FFR STUDY;  Surgeon: Yolonda Kida, MD;  Location: [place] [facility];  Service: Cardiovascular;  Laterality: N/A;   MANDIBLE SURGERY     NASAL SINUS SURGERY  03/2013   Multile surgery Dr. Carlis Abbott. 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 Tamala Julian   RIGHT/LEFT HEART CATH AND CORONARY ANGIOGRAPHY N/A 05/24/2021   Procedure: RIGHT/LEFT HEART CATH AND CORONARY ANGIOGRAPHY;  Surgeon: Yolonda Kida, MD;  Location: [place] [facility];  Service: Cardiovascular;  Laterality: N/A;   TEE WITHOUT CARDIOVERSION N/A 09/19/2021   Procedure: TRANSESOPHAGEAL ECHOCARDIOGRAM (TEE);  Surgeon: Gaye Pollack, MD;  Location: [place];  Service: Open Heart Surgery;  Laterality: N/A;   TONSILLECTOMY  1960    Family History  Problem Relation Age of Onset   Diabetes Father    Lung cancer Mother    Breast cancer Maternal Aunt    Irritable bowel syndrome Sister    Colon cancer Brother     Social History   Socioeconomic History   Marital status: Married    Spouse name: Katie Buckley, husband   Number of children: 1   Years of education: Not on file   Highest education level: Not on file  Occupational History    Comment: retired    Fish farm manager: RETIRED  Tobacco Use   Smoking status: Never   Smokeless tobacco: Never  Vaping Use   Vaping Use: Never used  Substance and Sexual Activity   Alcohol use: No   Drug use: No   Sexual activity: Not Currently    Partners: Male    Birth control/protection: Post-menopausal  Other Topics Concern   Not on file  Social History Narrative   Not on file   Social Determinants of Health   Financial Resource Strain: Low Risk  (01/31/2022)   Overall Financial Resource Strain (CARDIA)    Difficulty of Paying Living Expenses: Not hard at all  Food Insecurity: No Food Insecurity (01/31/2022)   Hunger Vital Sign    Worried About Running Out of Food in the Last Year: Never true    [place] in the Last Year: Never true  Transportation Needs: No Transportation Needs (01/31/2022)   PRAPARE - Transportation    Lack of Transportation (Medical): No    Lack of Transportation (Non-Medical): No  Physical Activity: Inactive (01/31/2022)   Exercise Vital Sign    Days of Exercise per Week: 0 days     Minutes of Exercise per Session: 0 min  Stress: Stress Concern Present (01/31/2022)   [place]    Feeling of Stress : To some extent  Social Connections: Moderately Isolated (01/31/2022)   Social Connection and Isolation Panel [NHANES]    Frequency of Communication with Friends and Family: Twice a week    Frequency of Social Gatherings with Friends and Family: Once a week    Attends Religious Services: Never    Marine scientist or Organizations: No    Attends Archivist Meetings: Never    Marital Status: Married  Human resources officer Violence: Not At Risk (01/31/2022)   Humiliation, Afraid, Rape, and Kick questionnaire    Fear  of Current or Ex-Partner: No    Emotionally Abused: No    Physically Abused: No    Sexually Abused: No    Outpatient Medications Prior to Visit  Medication Sig Dispense Refill   acetaminophen (TYLENOL) 650 MG CR tablet Take 1,300 mg by mouth every 8 (eight) hours as needed for pain (pain).     amLODipine (NORVASC) 10 MG tablet Take 1 tablet (10 mg total) by mouth daily. 30 tablet 2   aspirin EC 81 MG tablet Take 1 tablet (81 mg total) by mouth daily. Swallow whole.     atorvastatin (LIPITOR) 80 MG tablet      buPROPion (WELLBUTRIN SR) 150 MG 12 hr tablet Take 150 mg by mouth 2 (two) times daily.     butalbital-acetaminophen-caffeine (FIORICET) 50-325-40 MG tablet TAKE 1 TABLET BY MOUTH EVERY 6 HOURS AS NEEDED FOR HEADACHE (Patient taking differently: Take 1 tablet by mouth every 6 (six) hours as needed for headache.) 30 tablet 4   ezetimibe (ZETIA) 10 MG tablet TAKE 1 TABLET BY MOUTH EVERYDAY AT BEDTIME (Patient taking differently: Take 10 mg by mouth at bedtime.) 90 tablet 0   fexofenadine (ALLEGRA) 180 MG tablet Take 180 mg by mouth daily as needed for allergies or rhinitis.     hydrOXYzine (VISTARIL) 25 MG capsule Take 25 mg by mouth daily as needed for anxiety.     irbesartan  (AVAPRO) 75 MG tablet TAKE 1 TABLET BY MOUTH EVERY DAY 90 tablet 4   Lancet Devices MISC      loperamide (IMODIUM) 2 MG capsule Take 2-4 mg by mouth daily as needed for diarrhea or loose stools.     LYRICA 25 MG capsule Take 25 mg by mouth in the morning, at noon, and at bedtime.     metFORMIN (GLUCOPHAGE-XR) 500 MG 24 hr tablet TAKE 2 TABLETS BY MOUTH EVERY DAY 180 tablet 3   metoprolol tartrate (LOPRESSOR) 50 MG tablet Take 1 tablet (50 mg total) by mouth 2 (two) times daily. 60 tablet 2   montelukast (SINGULAIR) 10 MG tablet Take 1 tablet (10 mg total) by mouth at bedtime as needed (allergies). Home med.     Olopatadine HCl 0.7 % SOLN Place 1 drop into both eyes daily as needed (allergies).     ondansetron (ZOFRAN ODT) 4 MG disintegrating tablet Take 1 tablet (4 mg total) by mouth every 8 (eight) hours as needed for nausea or vomiting. 20 tablet 0   promethazine (PHENERGAN) 25 MG tablet Take 25 mg by mouth every 8 (eight) hours as needed.     RABEprazole (ACIPHEX) 20 MG tablet TAKE 1 TABLET BY MOUTH EVERY DAY 90 tablet 4   sertraline (ZOLOFT) 100 MG tablet Take 2 tablets (200 mg total) by mouth daily.     simethicone (MYLICON) 80 MG chewable tablet Chew 80 mg by mouth every 6 (six) hours as needed for flatulence.     glipiZIDE (GLUCOTROL) 10 MG tablet TAKE 1 TABLET BY MOUTH EVERY DAY BEFORE BREAKFAST (Patient taking differently: Take 10 mg by mouth daily before breakfast.) 90 tablet 4   apixaban (ELIQUIS) 5 MG TABS tablet Take 1 tablet (5 mg total) by mouth 2 (two) times daily. 60 tablet 2   No facility-administered medications prior to visit.    Allergies  Allergen Reactions   Doxycycline Other (See Comments)    Emotional changes/anxious   Biaxin [Clarithromycin]     GI upset, and bad taste in mouth.  Objective:    Physical Exam Vitals reviewed.  Constitutional:      Appearance: Normal appearance. She is normal weight.  Cardiovascular:     Rate and Rhythm: Normal rate  and regular rhythm.  Pulmonary:     Effort: Pulmonary effort is normal.     Breath sounds: Normal breath sounds.  Abdominal:     General: Abdomen is flat. Bowel sounds are normal.     Palpations: Abdomen is soft.     Tenderness: There is abdominal tenderness in the right upper quadrant, right lower quadrant and epigastric area. Positive signs include McBurney's sign. Negative signs include Murphy's sign, psoas sign and obturator sign.     Hernia: No hernia is present.     Comments: Mild ruq tenderness   Neurological:     Mental Status: She is alert.      BP 136/68   Pulse 77   Temp 98.6 F (37 C)   Resp 16   Ht 5' (1.524 m)   Wt 137 lb 4 oz (62.3 kg)   SpO2 92%   BMI 26.80 kg/m  Wt Readings from Last 3 Encounters:  03/10/22 137 lb 4 oz (62.3 kg)  03/08/22 138 lb (62.6 kg)  03/03/22 135 lb (61.2 kg)     Health Maintenance Due  Topic Date Due   FOOT EXAM  Never done   Zoster Vaccines- Shingrix (1 of 2) Never done   DEXA SCAN  Never done   TETANUS/TDAP  10/08/2017   COVID-19 Vaccine (3 - Pfizer risk series) 09/24/2019   COLONOSCOPY (Pts 45-45yrs Insurance coverage will need to be confirmed)  11/16/2020   OPHTHALMOLOGY EXAM  06/30/2021    There are no preventive care reminders to display for this patient.  Lab Results  Component Value Date   TSH 1.51 03/08/2022   Lab Results  Component Value Date   WBC 7.0 03/08/2022   HGB 8.7 (L) 03/08/2022   HCT 27.1 (L) 03/08/2022   MCV 78.1 03/08/2022   PLT 162.0 03/08/2022   Lab Results  Component Value Date   NA 139 03/08/2022   K 4.3 03/08/2022   CO2 30 03/08/2022   GLUCOSE 146 (H) 03/08/2022   BUN 15 03/08/2022   CREATININE 0.71 03/08/2022   BILITOT 0.7 03/08/2022   ALKPHOS 89 03/08/2022   AST 25 03/08/2022   ALT 18 03/08/2022   PROT 6.4 03/08/2022   ALBUMIN 4.2 03/08/2022   CALCIUM 9.2 03/08/2022   ANIONGAP 10 11/02/2021   EGFR 75 02/09/2021   GFR 86.05 03/08/2022   Lab Results  Component Value Date    CHOL 87 09/21/2021   Lab Results  Component Value Date   HDL 42 09/21/2021   Lab Results  Component Value Date   LDLCALC 21 09/21/2021   Lab Results  Component Value Date   TRIG 122 09/21/2021   Lab Results  Component Value Date   CHOLHDL 2.1 09/21/2021   Lab Results  Component Value Date   HGBA1C 6.1 03/08/2022      Assessment & Plan:   Problem List Items Addressed This Visit       Endocrine   Diabetes mellitus with neuropathy ([place]) - Primary    Decrease glipizide to 5 mg once daily  Work on diabetic diet exercise as tolerated Continue metformin as prescribed.      Relevant Medications   glipiZIDE (GLUCOTROL) 5 MG tablet     Other   Right upper quadrant abdominal pain   Relevant Orders  CT Abdomen Pelvis W Contrast   Right lower quadrant abdominal pain    Stat ct ab pelvis ordered today pending results Concern for colitis vs neoplasm based off of last CTA in may 2023      Relevant Orders   CT Abdomen Pelvis W Contrast   Anemia    Worsening anemia pt on eliquis Urgent referral to hematology and GI.  Trying to get pt worked in for colonoscopy for pt, reached out to [place] at GI office they are going to reach out to pt to get scheduled.   Did advise pt if any worsening sob, fatigue,  Or blood loss visualized in copious amounts go to ER or call 911.      Relevant Orders   Ambulatory referral to Hematology / Oncology   CT Abdomen Pelvis W Contrast   DOE (dyspnea on exertion)   Relevant Orders   Ambulatory referral to Hematology / Oncology   CT Abdomen Pelvis W Contrast   Other fatigue    Suspected due to anemia      Relevant Orders   Ambulatory referral to Hematology / Oncology   Positive for microalbuminuria    D/w pt compliance with irbesartan however did advise pt with new anemia to monitor blood pressure to make sure not hypotensive. Today ok at 136/68       Meds ordered this encounter  Medications   glipiZIDE (GLUCOTROL) 5 MG tablet     Sig: Take 1 tablet (5 mg total) by mouth daily.    Dispense:  30 tablet    Refill:  2    Order Specific Question:   Supervising Provider    Answer:   Diona Browner, AMY E [[id number]]    Follow-up: Return in about 2 weeks (around 03/24/2022) for f/u anemia.    Eugenia Pancoast, FNP

## 2022-03-10 NOTE — Patient Instructions (Signed)
  Decrease glipizide to 5 mg once daily, you can half your 10 mg tablet until you run out and then get refill.    ------------------------------------

## 2022-03-10 NOTE — Assessment & Plan Note (Signed)
Stat ct ab pelvis ordered today pending results Concern for colitis vs neoplasm based off of last CTA in may 2023

## 2022-03-13 ENCOUNTER — Telehealth: Payer: Self-pay | Admitting: *Deleted

## 2022-03-13 ENCOUNTER — Encounter: Payer: Self-pay | Admitting: Internal Medicine

## 2022-03-13 ENCOUNTER — Inpatient Hospital Stay: Payer: PPO

## 2022-03-13 ENCOUNTER — Other Ambulatory Visit: Payer: Self-pay | Admitting: *Deleted

## 2022-03-13 ENCOUNTER — Inpatient Hospital Stay: Payer: PPO | Attending: Internal Medicine | Admitting: Internal Medicine

## 2022-03-13 VITALS — BP 158/72 | HR 66 | Temp 97.5°F | Resp 18 | Wt 136.3 lb

## 2022-03-13 DIAGNOSIS — D649 Anemia, unspecified: Secondary | ICD-10-CM | POA: Diagnosis not present

## 2022-03-13 DIAGNOSIS — Z803 Family history of malignant neoplasm of breast: Secondary | ICD-10-CM | POA: Insufficient documentation

## 2022-03-13 DIAGNOSIS — Z85828 Personal history of other malignant neoplasm of skin: Secondary | ICD-10-CM | POA: Insufficient documentation

## 2022-03-13 DIAGNOSIS — K2270 Barrett's esophagus without dysplasia: Secondary | ICD-10-CM | POA: Insufficient documentation

## 2022-03-13 DIAGNOSIS — I25118 Atherosclerotic heart disease of native coronary artery with other forms of angina pectoris: Secondary | ICD-10-CM | POA: Diagnosis not present

## 2022-03-13 DIAGNOSIS — E878 Other disorders of electrolyte and fluid balance, not elsewhere classified: Secondary | ICD-10-CM | POA: Insufficient documentation

## 2022-03-13 DIAGNOSIS — Z86010 Personal history of colonic polyps: Secondary | ICD-10-CM | POA: Diagnosis not present

## 2022-03-13 DIAGNOSIS — R339 Retention of urine, unspecified: Secondary | ICD-10-CM | POA: Diagnosis not present

## 2022-03-13 DIAGNOSIS — R197 Diarrhea, unspecified: Secondary | ICD-10-CM | POA: Diagnosis not present

## 2022-03-13 DIAGNOSIS — Z801 Family history of malignant neoplasm of trachea, bronchus and lung: Secondary | ICD-10-CM | POA: Diagnosis not present

## 2022-03-13 DIAGNOSIS — R0602 Shortness of breath: Secondary | ICD-10-CM | POA: Insufficient documentation

## 2022-03-13 DIAGNOSIS — R011 Cardiac murmur, unspecified: Secondary | ICD-10-CM | POA: Diagnosis not present

## 2022-03-13 DIAGNOSIS — Z800 Family history of malignant neoplasm of digestive organs: Secondary | ICD-10-CM | POA: Diagnosis not present

## 2022-03-13 DIAGNOSIS — Z952 Presence of prosthetic heart valve: Secondary | ICD-10-CM | POA: Diagnosis not present

## 2022-03-13 DIAGNOSIS — K5900 Constipation, unspecified: Secondary | ICD-10-CM | POA: Insufficient documentation

## 2022-03-13 DIAGNOSIS — I350 Nonrheumatic aortic (valve) stenosis: Secondary | ICD-10-CM | POA: Insufficient documentation

## 2022-03-13 DIAGNOSIS — D509 Iron deficiency anemia, unspecified: Secondary | ICD-10-CM | POA: Insufficient documentation

## 2022-03-13 DIAGNOSIS — R2689 Other abnormalities of gait and mobility: Secondary | ICD-10-CM | POA: Diagnosis not present

## 2022-03-13 DIAGNOSIS — R413 Other amnesia: Secondary | ICD-10-CM | POA: Diagnosis not present

## 2022-03-13 DIAGNOSIS — E782 Mixed hyperlipidemia: Secondary | ICD-10-CM | POA: Diagnosis not present

## 2022-03-13 DIAGNOSIS — Z79899 Other long term (current) drug therapy: Secondary | ICD-10-CM | POA: Diagnosis not present

## 2022-03-13 DIAGNOSIS — R002 Palpitations: Secondary | ICD-10-CM | POA: Diagnosis not present

## 2022-03-13 DIAGNOSIS — R221 Localized swelling, mass and lump, neck: Secondary | ICD-10-CM | POA: Diagnosis not present

## 2022-03-13 DIAGNOSIS — E669 Obesity, unspecified: Secondary | ICD-10-CM | POA: Diagnosis not present

## 2022-03-13 DIAGNOSIS — G20C Parkinsonism, unspecified: Secondary | ICD-10-CM | POA: Diagnosis not present

## 2022-03-13 DIAGNOSIS — M4802 Spinal stenosis, cervical region: Secondary | ICD-10-CM | POA: Diagnosis not present

## 2022-03-13 DIAGNOSIS — R202 Paresthesia of skin: Secondary | ICD-10-CM | POA: Diagnosis not present

## 2022-03-13 DIAGNOSIS — E1165 Type 2 diabetes mellitus with hyperglycemia: Secondary | ICD-10-CM | POA: Diagnosis not present

## 2022-03-13 DIAGNOSIS — I739 Peripheral vascular disease, unspecified: Secondary | ICD-10-CM | POA: Diagnosis not present

## 2022-03-13 DIAGNOSIS — E1142 Type 2 diabetes mellitus with diabetic polyneuropathy: Secondary | ICD-10-CM | POA: Diagnosis not present

## 2022-03-13 LAB — IRON AND TIBC
Iron: 26 ug/dL — ABNORMAL LOW (ref 28–170)
Saturation Ratios: 6 % — ABNORMAL LOW (ref 10.4–31.8)
TIBC: 463 ug/dL — ABNORMAL HIGH (ref 250–450)
UIBC: 437 ug/dL

## 2022-03-13 LAB — FERRITIN: Ferritin: 16 ng/mL (ref 11–307)

## 2022-03-13 MED ORDER — NA SULFATE-K SULFATE-MG SULF 17.5-3.13-1.6 GM/177ML PO SOLN: 1.0000 | Freq: Once | ORAL | 0 refills | Status: DC

## 2022-03-13 NOTE — Progress Notes (Signed)
Patient here today for initial evaluation regarding anemia. Patient reports shortness of breath.

## 2022-03-13 NOTE — Telephone Encounter (Signed)
Left message to return call to our office.  

## 2022-03-13 NOTE — Telephone Encounter (Signed)
Spoke to pt about Ct results.

## 2022-03-13 NOTE — Progress Notes (Signed)
Katie Buckley  [phone]) [phone] [phone]) [phone]  ID: Katie Buckley OB: April 02, 1951  MR#: [id number]  CSN#:[id number]  Patient Care Team: Eugenia Pancoast, [place] as PCP - General (Family Medicine) Chauncey Mann, MD as Referring Physician (Psychiatry) Vladimir Crofts, MD as Consulting Physician (Neurology) Germaine Pomfret, [facility] (Pharmacist) Yolonda Kida, MD as Consulting Physician (Cardiology)  REFERRING PROVIDER: Eugenia Pancoast, FNP  REASON FOR REFERRAL: Worsening anemia  HPI: [name] is a 71 y.o. female with past medical history of aortic stenosis status post bioprosthetic valve in May 2023, Barrett's esophagus, anxiety, and skin cancer was referred to hematology for further work-up of worsening anemia.  Patient reports feeling tired and low motivation to do her daily activities.  She has mild shortness of breath at rest and on exertion. She reports constipation and when she takes stool softener she gets diarrhea. Denies any bleeding in stool, urine, nose or gum bleeding. Endoscopy done in June 2017 by Dr. Vira Agar showed esophageal mucosal changes secondary to established short segment Barrett's esophagus, erythema and mild inflammation in the gastric antrum.  H. pylori was negative.  Colonoscopy showed polyps in descending, transverse colon and cecum..  Pathology showed hyperplastic polyp in the transverse colon.  Tubular adenoma in descending colon.  Labs reviewed.  Hemoglobin slowly trending down with most recent 8.7 from 03/08/2022.  WBC 7.  Platelets 162.  MCV 78. Vitamin B12 and folate normal.  TSH normal.  Iron studies not available.   REVIEW OF SYSTEMS:   ROS  As per HPI. Otherwise, a complete review of systems is negative.  PAST MEDICAL HISTORY: 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   Frequent sinus infections    Headache    Tension headaches   History of  Barrett's esophagus    Hyperchloremia    MRSA (methicillin resistant staph aureus) culture positive 2018   history of, in hand   Palpitations    Seasonal allergies    Skin cancer    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    PAST SURGICAL HISTORY: Past Surgical History:  Procedure Laterality Date   ANTERIOR CERVICAL DECOMP/DISCECTOMY FUSION N/A 11/15/2020   Procedure: ANTERIOR CERVICAL DECOMPRESSION/DISCECTOMY FUSION 1 LEVEL C3/4;  Surgeon: Deetta Perla, MD;  Location: [facility];  Service: Neurosurgery;  Laterality: N/A;   AORTIC VALVE REPLACEMENT N/A 09/19/2021   Procedure: AORTIC VALVE REPLACEMENT Using 15m Edwards Resilia Inspiris Valve;  Surgeon: BGaye Pollack MD;  [place]  Service: Open Heart Surgery;  Laterality: N/A;   AUGMENTATION MAMMAPLASTY Bilateral    breast implants   Bone Spur  2007   foot   CARDIAC CATHETERIZATION     Carpal Tunnel Symdrome  2008   as repeated in 2009   CATARACT EXTRACTION W/PHACO Left 01/16/2019   Procedure: CATARACT EXTRACTION PHACO AND INTRAOCULAR LENS PLACEMENT ([place] LEFT DIABETES VISION BLUE;  Surgeon: HMarchia Meiers MD;  Location: [facility];  Service: Ophthalmology;  Laterality: Left;  [place] 00:47 CDE 6.62 Fluid pack lot # [id number]   CATARACT EXTRACTION W/PHACO Right 02/06/2019   Procedure: CATARACT EXTRACTION PHACO AND INTRAOCULAR LENS PLACEMENT (IOC);  Surgeon: HMarchia Meiers MD;  Location: [facility];  Service: Ophthalmology;  Laterality: Right;  [place]00:54.6 CDE 6.52 FLUID PACK LOT # [id number]   CHOLECYSTECTOMY  2007   COLONOSCOPY WITH PROPOFOL N/A 11/17/2015   Procedure: COLONOSCOPY WITH PROPOFOL;  Surgeon: RGavin Pound  Vira Agar, MD;  Location: [place] [facility];  Service: Endoscopy;  Laterality: N/A;   CORONARY ARTERY BYPASS GRAFT N/A 09/19/2021   Procedure: CORONARY ARTERY BYPASS GRAFTING X1 Using Left Internal Mammory artery;  Surgeon: Gaye Pollack, MD;  Location: [place];  Service: Open Heart Surgery;  Laterality: N/A;    ESOPHAGOGASTRODUODENOSCOPY (EGD) WITH PROPOFOL N/A 11/17/2015   Procedure: ESOPHAGOGASTRODUODENOSCOPY (EGD) WITH PROPOFOL;  Surgeon: Manya Silvas, MD;  Location: [facility];  Service: Endoscopy;  Laterality: N/A;   INTRAVASCULAR PRESSURE WIRE/FFR STUDY N/A 05/24/2021   Procedure: INTRAVASCULAR PRESSURE WIRE/FFR STUDY;  Surgeon: Yolonda Kida, MD;  Location: [place] CV LAB;  Service: Cardiovascular;  Laterality: N/A;   MANDIBLE SURGERY     NASAL SINUS SURGERY  03/2013   Multile surgery Dr. Carlis Abbott. 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 Tamala Julian   RIGHT/LEFT HEART CATH AND CORONARY ANGIOGRAPHY N/A 05/24/2021   Procedure: RIGHT/LEFT HEART CATH AND CORONARY ANGIOGRAPHY;  Surgeon: Yolonda Kida, MD;  Location: [place] [facility];  Service: Cardiovascular;  Laterality: N/A;   TEE WITHOUT CARDIOVERSION N/A 09/19/2021   Procedure: TRANSESOPHAGEAL ECHOCARDIOGRAM (TEE);  Surgeon: Gaye Pollack, MD;  Location: [place];  Service: Open Heart Surgery;  Laterality: N/A;   TONSILLECTOMY  1960    FAMILY HISTORY: Family History  Problem Relation Age of Onset   Diabetes Father    Lung cancer Mother    Breast cancer Maternal Aunt    Irritable bowel syndrome Sister    Colon cancer Brother     HEALTH MAINTENANCE: Social History   Tobacco Use   Smoking status: Never   Smokeless tobacco: Never  Vaping Use   Vaping Use: Never used  Substance Use Topics   Alcohol use: No   Drug use: No     Allergies  Allergen Reactions   Doxycycline Other (See Comments)    Emotional changes/anxious   Biaxin [Clarithromycin]     GI upset, and bad taste in mouth.    Current Outpatient Medications  Medication Sig Dispense Refill   acetaminophen (TYLENOL) 650 MG CR tablet Take 1,300 mg by mouth every 8 (eight) hours as needed for pain (pain).     aspirin EC 81 MG tablet Take 1 tablet (81 mg total) by mouth daily. Swallow whole.     atorvastatin  (LIPITOR) 80 MG tablet      buPROPion (WELLBUTRIN SR) 150 MG 12 hr tablet Take 150 mg by mouth 2 (two) times daily.     butalbital-acetaminophen-caffeine (FIORICET) 50-325-40 MG tablet TAKE 1 TABLET BY MOUTH EVERY 6 HOURS AS NEEDED FOR HEADACHE (Patient taking differently: Take 1 tablet by mouth every 6 (six) hours as needed for headache.) 30 tablet 4   ezetimibe (ZETIA) 10 MG tablet TAKE 1 TABLET BY MOUTH EVERYDAY AT BEDTIME (Patient taking differently: Take 10 mg by mouth at bedtime.) 90 tablet 0   fexofenadine (ALLEGRA) 180 MG tablet Take 180 mg by mouth daily as needed for allergies or rhinitis.     glipiZIDE (GLUCOTROL) 5 MG tablet Take 1 tablet (5 mg total) by mouth daily. (Patient taking differently: Take 5 mg by mouth daily. 0.5 tablet) 30 tablet 2   hydrOXYzine (VISTARIL) 25 MG capsule Take 25 mg by mouth daily as needed for anxiety.     irbesartan (AVAPRO) 75 MG tablet TAKE 1 TABLET BY MOUTH EVERY DAY 90 tablet 4   Lancet Devices MISC      loperamide (IMODIUM) 2 MG  capsule Take 2-4 mg by mouth daily as needed for diarrhea or loose stools.     LYRICA 25 MG capsule Take 25 mg by mouth 2 (two) times daily.     metFORMIN (GLUCOPHAGE-XR) 500 MG 24 hr tablet TAKE 2 TABLETS BY MOUTH EVERY DAY 180 tablet 3   metoprolol tartrate (LOPRESSOR) 50 MG tablet Take 1 tablet (50 mg total) by mouth 2 (two) times daily. 60 tablet 2   Olopatadine HCl 0.7 % SOLN Place 1 drop into both eyes daily as needed (allergies).     ondansetron (ZOFRAN ODT) 4 MG disintegrating tablet Take 1 tablet (4 mg total) by mouth every 8 (eight) hours as needed for nausea or vomiting. 20 tablet 0   promethazine (PHENERGAN) 25 MG tablet Take 25 mg by mouth every 8 (eight) hours as needed.     RABEprazole (ACIPHEX) 20 MG tablet TAKE 1 TABLET BY MOUTH EVERY DAY 90 tablet 4   sertraline (ZOLOFT) 100 MG tablet Take 2 tablets (200 mg total) by mouth daily.     simethicone (MYLICON) 80 MG chewable tablet Chew 80 mg by mouth every 6 (six)  hours as needed for flatulence.     apixaban (ELIQUIS) 5 MG TABS tablet Take 1 tablet (5 mg total) by mouth 2 (two) times daily. 60 tablet 2   montelukast (SINGULAIR) 10 MG tablet Take 1 tablet (10 mg total) by mouth at bedtime as needed (allergies). Home med. (Patient not taking: Reported on 03/13/2022)     No current facility-administered medications for this visit.    OBJECTIVE: Vitals:   03/13/22 1012  BP: (!) 158/72  Pulse: 66  Resp: 18  Temp: (!) 97.5 F (36.4 C)  SpO2: 96%     Body mass index is 26.62 kg/m.      General: Well-developed, well-nourished, no acute distress. Eyes: Pink conjunctiva, anicteric sclera. HEENT: Normocephalic, moist mucous membranes, clear oropharnyx. Lungs: Clear to auscultation bilaterally. Heart: Regular rate and rhythm. No rubs, murmurs, or gallops. Abdomen: Soft, nontender, nondistended. No organomegaly noted, normoactive bowel sounds. Musculoskeletal: No edema, cyanosis, or clubbing. Neuro: Alert, answering all questions appropriately. Cranial nerves grossly intact. Skin: No rashes or petechiae noted. Psych: Normal affect. Lymphatics: No cervical, calvicular, axillary or inguinal LAD.   LAB RESULTS:  Lab Results  Component Value Date   NA 139 03/08/2022   K 4.3 03/08/2022   CL 103 03/08/2022   CO2 30 03/08/2022   GLUCOSE 146 (H) 03/08/2022   BUN 15 03/08/2022   CREATININE 0.71 03/08/2022   CALCIUM 9.2 03/08/2022   PROT 6.4 03/08/2022   ALBUMIN 4.2 03/08/2022   AST 25 03/08/2022   ALT 18 03/08/2022   ALKPHOS 89 03/08/2022   BILITOT 0.7 03/08/2022   GFRNONAA >60 11/02/2021   GFRAA 89 11/19/2019    Lab Results  Component Value Date   WBC 7.0 03/08/2022   NEUTROABS 6.5 10/25/2021   HGB 8.7 (L) 03/08/2022   HCT 27.1 (L) 03/08/2022   MCV 78.1 03/08/2022   PLT 162.0 03/08/2022    No results found for: "TIBC", "FERRITIN", "IRONPCTSAT"   STUDIES: CT Abdomen Pelvis W Contrast  Result Date: 03/10/2022 CLINICAL DATA:  RLQ  abdominal pain (Age >= 14y) mild ruq abdominal tenderness EXAM: CT ABDOMEN AND PELVIS WITH CONTRAST TECHNIQUE: Multidetector CT imaging of the abdomen and pelvis was performed using the standard protocol following bolus administration of intravenous contrast. RADIATION DOSE REDUCTION: This exam was performed according to the departmental dose-optimization program which includes automated exposure control,  adjustment of the mA and/or kV according to patient size and/or use of iterative reconstruction technique. CONTRAST:  13m OMNIPAQUE IOHEXOL 300 MG/ML  SOLN COMPARISON:  CTA chest 10/31/2021, CTA abdomen 06/23/2021 FINDINGS: Lower chest: No pleural or pericardial effusion. Previous AVR. Coronary calcifications. Mitral annulus calcifications. Visualized lung bases clear. Bilateral breast implants partially visualized. Hepatobiliary: No focal liver abnormality is seen. Status post cholecystectomy. No biliary dilatation. Pancreas: Unremarkable. No pancreatic ductal dilatation or surrounding inflammatory changes. Spleen: Normal in size without focal abnormality. Adrenals/Urinary Tract: Adrenal glands are unremarkable. Kidneys are normal, without renal calculi, focal lesion, or hydronephrosis. Bladder is unremarkable. Stomach/Bowel: Stomach is nondistended. Small bowel nondilated, unremarkable. Normal appendix. The colon is incompletely distended, unremarkable. Vascular/Lymphatic: Mild aortoiliac calcified atheromatous plaque without aneurysm or evident stenosis. No abdominal or pelvic adenopathy. Reproductive: Uterus and bilateral adnexa are unremarkable. Other: Bilateral pelvic phleboliths.  No ascites.  No free air. Musculoskeletal: Sternotomy wires. Lumbar degenerative disc disease most marked L2-4. No acute findings. IMPRESSION: 1. No acute findings. Normal appendix. 2. Post cholecystectomy. 3. Coronary and aortic Atherosclerosis (ICD10-170.0). Electronically Signed   By: DLucrezia EuropeM.D.   On: 03/10/2022 15:57     ASSESSMENT AND PLAN:   Katie FONNERis a 71y.o. female with pmh of aortic stenosis status post bioprosthetic valve in May 2023, Barrett's esophagus, anxiety, and skin cancer was referred to hematology for further work-up of worsening anemia.  # Microcytic anemia  - Progressive. - Labs reviewed.  Hemoglobin slowly trending down with most recent 8.7 from 03/08/2022.  WBC 7.  Platelets 162.  MCV 78. - Vitamin B12 and folate normal.  TSH normal. -Obtain iron studies. -Patient denies any bleeding. Endoscopy done in June 2017 by Dr. EVira Agarshowed esophageal mucosal changes secondary to established short segment Barrett's esophagus, erythema and mild inflammation in the gastric antrum.  H. pylori was negative.  Colonoscopy showed polyps in descending, transverse colon and cecum..  Pathology showed hyperplastic polyp in the transverse colon.  Tubular adenoma in descending colon.  -She already has issues with constipation.  Discussed with the patient that I am concerned about iron deficiency.  I will call her tomorrow with the iron studies.  If she is low, we will schedule her for Feraheme 550 mg x 2 weekly.  Low but potential risk of anaphylactic reaction was also discussed.  GI referral.  Orders Placed This Encounter  Procedures   Iron and TIBC([facility])   Ferritin   Ferritin   CBC with Differential (Cancer Center Only)   Iron and TIBC([facility])   Ambulatory referral to Gastroenterology    RTC in 8 weeks for MD visit, labs prior.  Patient expressed understanding and was in agreement with this plan. She also understands that She can call clinic at any time with any questions, concerns, or complaints.   I spent a total of 45 minutes reviewing chart data, face-to-face evaluation with the patient, counseling and coordination of care as detailed above.  KJane Canary MD   03/13/2022 10:47 AM

## 2022-03-13 NOTE — Telephone Encounter (Signed)
Gastroenterology Pre-Procedure Review  Request Date: 04/12/2022 Requesting Physician: Dr. Vicente Males  PATIENT REVIEW QUESTIONS: The patient responded to the following health history questions as indicated:    1. Are you having any GI issues? no 2. Do you have a personal history of Polyps? yes (last colonoscopy 11/17/2015) 3. Do you have a family history of Colon Cancer or Polyps? no 4. Diabetes Mellitus? yes (Type 2 DM) 5. Joint replacements in the past 12 months?no 6. Major health problems in the past 3 months?no 7. Any artificial heart valves, MVP, or defibrillator?no    MEDICATIONS & ALLERGIES:    Patient reports the following regarding taking any anticoagulation/antiplatelet therapy:   Plavix, Coumadin, Eliquis, Xarelto, Lovenox, Pradaxa, Brilinta, or Effient? yes (Eliquis) Aspirin? no  Patient confirms/reports the following medications:  Current Outpatient Medications  Medication Sig Dispense Refill   acetaminophen (TYLENOL) 650 MG CR tablet Take 1,300 mg by mouth every 8 (eight) hours as needed for pain (pain).     apixaban (ELIQUIS) 5 MG TABS tablet Take 1 tablet (5 mg total) by mouth 2 (two) times daily. 60 tablet 2   aspirin EC 81 MG tablet Take 1 tablet (81 mg total) by mouth daily. Swallow whole.     atorvastatin (LIPITOR) 80 MG tablet      buPROPion (WELLBUTRIN SR) 150 MG 12 hr tablet Take 150 mg by mouth 2 (two) times daily.     butalbital-acetaminophen-caffeine (FIORICET) 50-325-40 MG tablet TAKE 1 TABLET BY MOUTH EVERY 6 HOURS AS NEEDED FOR HEADACHE (Patient taking differently: Take 1 tablet by mouth every 6 (six) hours as needed for headache.) 30 tablet 4   ezetimibe (ZETIA) 10 MG tablet TAKE 1 TABLET BY MOUTH EVERYDAY AT BEDTIME (Patient taking differently: Take 10 mg by mouth at bedtime.) 90 tablet 0   fexofenadine (ALLEGRA) 180 MG tablet Take 180 mg by mouth daily as needed for allergies or rhinitis.     glipiZIDE (GLUCOTROL) 5 MG tablet Take 1 tablet (5 mg total) by mouth  daily. (Patient taking differently: Take 5 mg by mouth daily. 0.5 tablet) 30 tablet 2   hydrOXYzine (VISTARIL) 25 MG capsule Take 25 mg by mouth daily as needed for anxiety.     irbesartan (AVAPRO) 75 MG tablet TAKE 1 TABLET BY MOUTH EVERY DAY 90 tablet 4   Lancet Devices MISC      loperamide (IMODIUM) 2 MG capsule Take 2-4 mg by mouth daily as needed for diarrhea or loose stools.     LYRICA 25 MG capsule Take 25 mg by mouth 2 (two) times daily.     metFORMIN (GLUCOPHAGE-XR) 500 MG 24 hr tablet TAKE 2 TABLETS BY MOUTH EVERY DAY 180 tablet 3   metoprolol tartrate (LOPRESSOR) 50 MG tablet Take 1 tablet (50 mg total) by mouth 2 (two) times daily. 60 tablet 2   montelukast (SINGULAIR) 10 MG tablet Take 1 tablet (10 mg total) by mouth at bedtime as needed (allergies). Home med. (Patient not taking: Reported on 03/13/2022)     Olopatadine HCl 0.7 % SOLN Place 1 drop into both eyes daily as needed (allergies).     ondansetron (ZOFRAN ODT) 4 MG disintegrating tablet Take 1 tablet (4 mg total) by mouth every 8 (eight) hours as needed for nausea or vomiting. 20 tablet 0   promethazine (PHENERGAN) 25 MG tablet Take 25 mg by mouth every 8 (eight) hours as needed.     RABEprazole (ACIPHEX) 20 MG tablet TAKE 1 TABLET BY MOUTH EVERY DAY 90 tablet 4  sertraline (ZOLOFT) 100 MG tablet Take 2 tablets (200 mg total) by mouth daily.     simethicone (MYLICON) 80 MG chewable tablet Chew 80 mg by mouth every 6 (six) hours as needed for flatulence.     No current facility-administered medications for this visit.    Patient confirms/reports the following allergies:  Allergies  Allergen Reactions   Doxycycline Other (See Comments)    Emotional changes/anxious   Biaxin [Clarithromycin]     GI upset, and bad taste in mouth.    No orders of the defined types were placed in this encounter.   AUTHORIZATION INFORMATION Primary Insurance: 1D#: Group #:  Secondary Insurance: 1D#: Group #:  SCHEDULE  INFORMATION: Date: 04/12/2022 Time: Location:[facility]

## 2022-03-14 ENCOUNTER — Other Ambulatory Visit: Payer: Self-pay | Admitting: Internal Medicine

## 2022-03-14 ENCOUNTER — Telehealth: Payer: Self-pay | Admitting: Internal Medicine

## 2022-03-14 NOTE — Progress Notes (Signed)
Spoke with patient about iron studies.  Will schedule for iv ferraheme weekly x 2.

## 2022-03-14 NOTE — Telephone Encounter (Signed)
Per secure chat- good morning, please schedule for iv ferraheme weekly x 2. thank you.   Appointments scheduled patient notified.

## 2022-03-15 NOTE — Progress Notes (Signed)
Around the heart, aorta area.

## 2022-03-16 ENCOUNTER — Encounter: Payer: Self-pay | Admitting: Internal Medicine

## 2022-03-16 ENCOUNTER — Inpatient Hospital Stay: Payer: PPO

## 2022-03-16 ENCOUNTER — Other Ambulatory Visit: Payer: Self-pay | Admitting: *Deleted

## 2022-03-16 ENCOUNTER — Telehealth: Payer: Self-pay | Admitting: *Deleted

## 2022-03-16 ENCOUNTER — Inpatient Hospital Stay (HOSPITAL_BASED_OUTPATIENT_CLINIC_OR_DEPARTMENT_OTHER): Payer: PPO | Admitting: Internal Medicine

## 2022-03-16 VITALS — BP 148/92 | HR 87 | Temp 97.0°F | Resp 16

## 2022-03-16 DIAGNOSIS — D649 Anemia, unspecified: Secondary | ICD-10-CM

## 2022-03-16 DIAGNOSIS — R221 Localized swelling, mass and lump, neck: Secondary | ICD-10-CM

## 2022-03-16 DIAGNOSIS — R599 Enlarged lymph nodes, unspecified: Secondary | ICD-10-CM | POA: Diagnosis not present

## 2022-03-16 DIAGNOSIS — D509 Iron deficiency anemia, unspecified: Secondary | ICD-10-CM | POA: Diagnosis not present

## 2022-03-16 MED ORDER — SODIUM CHLORIDE 0.9 % IV SOLN
510.0000 mg | Freq: Once | INTRAVENOUS | Status: AC
  Administered 2022-03-16: 510 mg via INTRAVENOUS
  Filled 2022-03-16: qty 510

## 2022-03-16 MED ORDER — AMOXICILLIN-POT CLAVULANATE 875-125 MG PO TABS: 1.0000 | ORAL_TABLET | Freq: Two times a day (BID) | ORAL | 0 refills | Status: DC

## 2022-03-16 MED ORDER — SODIUM CHLORIDE 0.9 % IV SOLN
Freq: Once | INTRAVENOUS | Status: AC
  Filled 2022-03-16: qty 250

## 2022-03-16 NOTE — Progress Notes (Addendum)
[place]  [phone] [phone]  ID: Katie Buckley OB: 30-Aug-1950  MR#: [id number]  CSN#:[id number]  Patient Care Team: Eugenia Pancoast, [place] as PCP - General (Family Medicine) Chauncey Mann, MD as Referring Physician (Psychiatry) Vladimir Crofts, MD as Consulting Physician (Neurology) Germaine Pomfret, [facility] (Pharmacist) Yolonda Kida, MD as Consulting Physician (Cardiology)  REFERRING PROVIDER: Eugenia Pancoast, FNP  REASON FOR REFERRAL: Worsening anemia  HPI: Katie Buckley is a 71 y.o. female with past medical history of aortic stenosis status post bioprosthetic valve in May 2023, Barrett's esophagus, anxiety, and skin cancer was referred to hematology for further work-up of worsening anemia.  Patient reports feeling tired and low motivation to do her daily activities.  She has mild shortness of breath at rest and on exertion. She reports constipation and when she takes stool softener she gets diarrhea. Denies any bleeding in stool, urine, nose or gum bleeding. Endoscopy done in June 2017 by Dr. Vira Agar showed esophageal mucosal changes secondary to established short segment Barrett's esophagus, erythema and mild inflammation in the gastric antrum.  H. pylori was negative.  Colonoscopy showed polyps in descending, transverse colon and cecum..  Pathology showed hyperplastic polyp in the transverse colon.  Tubular adenoma in descending colon.  Labs reviewed.  Hemoglobin slowly trending down with most recent 8.7 from 03/08/2022.  WBC 7.  Platelets 162.  MCV 78. Vitamin B12 and folate normal.  TSH normal.  Iron studies ferritin 16, saturation 6%  INTERVAL HISTORY-  Patient was seen today in the infusion room when she presented for first dose of IV Feraheme. She reported swelling and pain in the right side of the neck which she noticed yesterday.  Has runny nose.  Has some soreness in throat.  Denies fever, chills.  Denies any sick  contact.  REVIEW OF SYSTEMS:   Review of Systems  Constitutional:  Negative for chills and fever.  HENT:  Positive for sore throat.   Respiratory:         Runny nose Sore throat  Genitourinary: Negative.   Musculoskeletal: Negative.     As per HPI. Otherwise, a complete review of systems is negative.  PAST MEDICAL HISTORY: 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    PAST SURGICAL HISTORY: Past Surgical History:  Procedure Laterality Date   ANTERIOR CERVICAL DECOMP/DISCECTOMY FUSION N/A 11/15/2020   Procedure: ANTERIOR CERVICAL DECOMPRESSION/DISCECTOMY FUSION 1 LEVEL C3/4;  Surgeon: Deetta Perla, MD;  Location: [facility];  Service: Neurosurgery;  Laterality: N/A;   AORTIC VALVE REPLACEMENT N/A 09/19/2021   Procedure: AORTIC VALVE REPLACEMENT Using 66m Edwards Resilia Inspiris Valve;  Surgeon: BGaye Pollack MD;  Location: [facility]  Service: Open Heart Surgery;  Laterality: N/A;   AUGMENTATION MAMMAPLASTY Bilateral    breast implants   Bone Spur  2007   foot   CARDIAC CATHETERIZATION     Carpal Tunnel Symdrome  2008   as repeated in 2009   CATARACT EXTRACTION W/PHACO Left 01/16/2019   Procedure: CATARACT EXTRACTION PHACO AND  INTRAOCULAR LENS PLACEMENT ([place]) LEFT DIABETES VISION BLUE;  Surgeon: Marchia Meiers, MD;  Location: [facility];  Service: Ophthalmology;  Laterality: Left;  [place]  00:47 CDE 6.62 Fluid pack lot # [id number]   CATARACT EXTRACTION W/PHACO Right 02/06/2019   Procedure: CATARACT EXTRACTION PHACO AND INTRAOCULAR LENS PLACEMENT (IOC);  Surgeon: Marchia Meiers, MD;  Location: [facility];  Service:  Ophthalmology;  Laterality: Right;  [place] 00:54.6 CDE 6.52 FLUID PACK LOT # [id number]   CHOLECYSTECTOMY  2007   COLONOSCOPY WITH PROPOFOL N/A 11/17/2015   Procedure: COLONOSCOPY WITH PROPOFOL;  Surgeon: Manya Silvas, MD;  [facility];  Service: Endoscopy;  Laterality: N/A;   CORONARY ARTERY BYPASS GRAFT N/A 09/19/2021   Procedure: CORONARY ARTERY BYPASS GRAFTING X1 Using Left Internal Mammory artery;  Surgeon: Gaye Pollack, MD;  Location: [place];  Service: Open Heart Surgery;  Laterality: N/A;   ESOPHAGOGASTRODUODENOSCOPY (EGD) WITH PROPOFOL N/A 11/17/2015   Procedure: ESOPHAGOGASTRODUODENOSCOPY (EGD) WITH PROPOFOL;  Surgeon: Manya Silvas, MD;  Location: [facility];  Service: Endoscopy;  Laterality: N/A;   INTRAVASCULAR PRESSURE WIRE/FFR STUDY N/A 05/24/2021   Procedure: INTRAVASCULAR PRESSURE WIRE/FFR STUDY;  Surgeon: Yolonda Kida, MD;  Location: [place] [facility];  Service: Cardiovascular;  Laterality: N/A;   MANDIBLE SURGERY     NASAL SINUS SURGERY  03/2013   Multile surgery Dr. Carlis Abbott. 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 Tamala Julian   RIGHT/LEFT HEART CATH AND CORONARY ANGIOGRAPHY N/A 05/24/2021   Procedure: RIGHT/LEFT HEART CATH AND CORONARY ANGIOGRAPHY;  Surgeon: Yolonda Kida, MD;  Location: [place] [facility];  Service: Cardiovascular;  Laterality: N/A;   TEE WITHOUT CARDIOVERSION N/A 09/19/2021   Procedure: TRANSESOPHAGEAL ECHOCARDIOGRAM (TEE);  Surgeon: Gaye Pollack, MD;  Location: [place];  Service: Open Heart Surgery;  Laterality: N/A;   TONSILLECTOMY  1960    FAMILY HISTORY: Family History  Problem Relation Age of Onset   Diabetes Father    Lung cancer Mother    Breast cancer Maternal Aunt    Irritable bowel syndrome Sister    Colon cancer Brother     HEALTH MAINTENANCE: Social History   Tobacco Use   Smoking status: Never   Smokeless tobacco: Never  Vaping Use   Vaping Use: Never used   Substance Use Topics   Alcohol use: No   Drug use: No     Allergies  Allergen Reactions   Doxycycline Other (See Comments)    Emotional changes/anxious   Biaxin [Clarithromycin]     GI upset, and bad taste in mouth.    Current Outpatient Medications  Medication Sig Dispense Refill   amoxicillin-clavulanate (AUGMENTIN) 875-125 MG tablet Take 1 tablet by mouth 2 (two) times daily for 7 days. 14 tablet 0   acetaminophen (TYLENOL) 650 MG CR tablet Take 1,300 mg by mouth every 8 (eight) hours as needed for pain (pain).     apixaban (ELIQUIS) 5 MG TABS tablet Take 1 tablet (5 mg total) by mouth 2 (two) times daily. 60 tablet 2   aspirin EC 81 MG tablet Take 1 tablet (81 mg total) by mouth daily. Swallow whole.     atorvastatin (LIPITOR) 80 MG tablet      buPROPion (WELLBUTRIN SR) 150 MG 12 hr tablet Take 150 mg by mouth 2 (two) times daily.     butalbital-acetaminophen-caffeine (FIORICET) 50-325-40 MG tablet TAKE 1 TABLET BY MOUTH EVERY 6 HOURS AS NEEDED FOR HEADACHE (Patient taking differently: Take  1 tablet by mouth every 6 (six) hours as needed for headache.) 30 tablet 4   ezetimibe (ZETIA) 10 MG tablet TAKE 1 TABLET BY MOUTH EVERYDAY AT BEDTIME (Patient taking differently: Take 10 mg by mouth at bedtime.) 90 tablet 0   fexofenadine (ALLEGRA) 180 MG tablet Take 180 mg by mouth daily as needed for allergies or rhinitis.     glipiZIDE (GLUCOTROL) 5 MG tablet Take 1 tablet (5 mg total) by mouth daily. (Patient taking differently: Take 5 mg by mouth daily. 0.5 tablet) 30 tablet 2   hydrOXYzine (VISTARIL) 25 MG capsule Take 25 mg by mouth daily as needed for anxiety.     irbesartan (AVAPRO) 75 MG tablet TAKE 1 TABLET BY MOUTH EVERY DAY 90 tablet 4   Lancet Devices MISC      loperamide (IMODIUM) 2 MG capsule Take 2-4 mg by mouth daily as needed for diarrhea or loose stools.     LYRICA 25 MG capsule Take 25 mg by mouth 2 (two) times daily.     metFORMIN (GLUCOPHAGE-XR) 500 MG 24 hr tablet TAKE  2 TABLETS BY MOUTH EVERY DAY 180 tablet 3   metoprolol tartrate (LOPRESSOR) 50 MG tablet Take 1 tablet (50 mg total) by mouth 2 (two) times daily. 60 tablet 2   montelukast (SINGULAIR) 10 MG tablet Take 1 tablet (10 mg total) by mouth at bedtime as needed (allergies). Home med. (Patient not taking: Reported on 03/13/2022)     Olopatadine HCl 0.7 % SOLN Place 1 drop into both eyes daily as needed (allergies).     ondansetron (ZOFRAN ODT) 4 MG disintegrating tablet Take 1 tablet (4 mg total) by mouth every 8 (eight) hours as needed for nausea or vomiting. 20 tablet 0   promethazine (PHENERGAN) 25 MG tablet Take 25 mg by mouth every 8 (eight) hours as needed.     RABEprazole (ACIPHEX) 20 MG tablet TAKE 1 TABLET BY MOUTH EVERY DAY 90 tablet 4   sertraline (ZOLOFT) 100 MG tablet Take 2 tablets (200 mg total) by mouth daily.     simethicone (MYLICON) 80 MG chewable tablet Chew 80 mg by mouth every 6 (six) hours as needed for flatulence.     No current facility-administered medications for this visit.    OBJECTIVE: There were no vitals filed for this visit.    There is no height or weight on file to calculate BMI.      General: Well-developed, well-nourished, no acute distress. Eyes: Pink conjunctiva, anicteric sclera. HEENT: Oropharynx clear Lungs: Clear to auscultation bilaterally. Heart: Regular rate and rhythm. No rubs, murmurs, or gallops. Abdomen: Soft, nontender, nondistended. No organomegaly noted, normoactive bowel sounds. Musculoskeletal: No edema, cyanosis, or clubbing. Neuro: Alert, answering all questions appropriately. Cranial nerves grossly intact. Skin: No rashes or petechiae noted. Psych: Normal affect. Lymphatics: Palpable about 2 cm right cervical lymph node, tender.   LAB RESULTS:  Lab Results  Component Value Date   NA 139 03/08/2022   K 4.3 03/08/2022   CL 103 03/08/2022   CO2 30 03/08/2022   GLUCOSE 146 (H) 03/08/2022   BUN 15 03/08/2022   CREATININE 0.71  03/08/2022   CALCIUM 9.2 03/08/2022   PROT 6.4 03/08/2022   ALBUMIN 4.2 03/08/2022   AST 25 03/08/2022   ALT 18 03/08/2022   ALKPHOS 89 03/08/2022   BILITOT 0.7 03/08/2022   GFRNONAA >60 11/02/2021   GFRAA 89 11/19/2019    Lab Results  Component Value Date   WBC 7.0 03/08/2022  NEUTROABS 6.5 10/25/2021   HGB 8.7 (L) 03/08/2022   HCT 27.1 (L) 03/08/2022   MCV 78.1 03/08/2022   PLT 162.0 03/08/2022    Lab Results  Component Value Date   TIBC 463 (H) 03/13/2022   FERRITIN 16 03/13/2022   IRONPCTSAT 6 (L) 03/13/2022     STUDIES: CT Abdomen Pelvis W Contrast  Result Date: 03/10/2022 CLINICAL DATA:  RLQ abdominal pain (Age >= 14y) mild ruq abdominal tenderness EXAM: CT ABDOMEN AND PELVIS WITH CONTRAST TECHNIQUE: Multidetector CT imaging of the abdomen and pelvis was performed using the standard protocol following bolus administration of intravenous contrast. RADIATION DOSE REDUCTION: This exam was performed according to the departmental dose-optimization program which includes automated exposure control, adjustment of the mA and/or kV according to patient size and/or use of iterative reconstruction technique. CONTRAST:  121m OMNIPAQUE IOHEXOL 300 MG/ML  SOLN COMPARISON:  CTA chest 10/31/2021, CTA abdomen 06/23/2021 FINDINGS: Lower chest: No pleural or pericardial effusion. Previous AVR. Coronary calcifications. Mitral annulus calcifications. Visualized lung bases clear. Bilateral breast implants partially visualized. Hepatobiliary: No focal liver abnormality is seen. Status post cholecystectomy. No biliary dilatation. Pancreas: Unremarkable. No pancreatic ductal dilatation or surrounding inflammatory changes. Spleen: Normal in size without focal abnormality. Adrenals/Urinary Tract: Adrenal glands are unremarkable. Kidneys are normal, without renal calculi, focal lesion, or hydronephrosis. Bladder is unremarkable. Stomach/Bowel: Stomach is nondistended. Small bowel nondilated,  unremarkable. Normal appendix. The colon is incompletely distended, unremarkable. Vascular/Lymphatic: Mild aortoiliac calcified atheromatous plaque without aneurysm or evident stenosis. No abdominal or pelvic adenopathy. Reproductive: Uterus and bilateral adnexa are unremarkable. Other: Bilateral pelvic phleboliths.  No ascites.  No free air. Musculoskeletal: Sternotomy wires. Lumbar degenerative disc disease most marked L2-4. No acute findings. IMPRESSION: 1. No acute findings. Normal appendix. 2. Post cholecystectomy. 3. Coronary and aortic Atherosclerosis (ICD10-170.0). Electronically Signed   By: DLucrezia EuropeM.D.   On: 03/10/2022 15:57    ASSESSMENT AND PLAN:   Katie PELPHREYis a 71y.o. female with pmh of aortic stenosis status post bioprosthetic valve in May 2023, Barrett's esophagus, anxiety, and skin cancer was referred to hematology for further work-up of worsening anemia.  #Right neck swelling  -for one day. Reports some soreness and runny nose.   -We will do trial of Augmentin for 7 days. -on exam there is palpable tender about 2 cm right cervical lymph node. Oropharynx is normal. CT neck with contrast was ordered. - discussed that if neck swelling continues to worsen with difficulty swallowing, breathing, drooling should report to ER.   # Microcytic anemia  - Progressive. - Labs reviewed.  Hemoglobin slowly trending down with most recent 8.7 from 03/08/2022.  WBC 7.  Platelets 162.  MCV 78. - Vitamin B12 and folate normal.  TSH normal. -Iron studies showed ferritin 16, saturation 6%.  Tolerated first cycle of Feraheme well. -Patient denies any bleeding. Endoscopy done in June 2017 by Dr. EVira Agarshowed esophageal mucosal changes secondary to established short segment Barrett's esophagus, erythema and mild inflammation in the gastric antrum.  H. pylori was negative.  Colonoscopy showed polyps in descending, transverse colon and cecum..  Pathology showed hyperplastic polyp in the  transverse colon.  Tubular adenoma in descending colon. Gi referral   - tolerated first dose of iv ferraheme well.   No orders of the defined types were placed in this encounter.   RTC in 1 week for md visit to discuss ct results.  Dose 2 of ferraheme   Patient expressed understanding and was in  agreement with this plan. She also understands that She can call clinic at any time with any questions, concerns, or complaints.   I spent a total of 45 minutes reviewing chart data, face-to-face evaluation with the patient, counseling and coordination of care as detailed above.  Jane Canary, MD   03/16/2022 3:13 PM

## 2022-03-16 NOTE — Telephone Encounter (Signed)
Voicemail message left for patient to return my call. Need to inform patient that we received clearance for patient to be able to do the colonoscopy and need to inform instructions on Eliquis.

## 2022-03-16 NOTE — Telephone Encounter (Signed)
Patient called back. I have notified patient she is cleared for the procedure and to hold Eliquis 3 days prior and restart 2 days after.  Patient verbalized understanding.

## 2022-03-16 NOTE — Progress Notes (Signed)
Patient here today for 1st time feraheme infusion. Patient c/o right neck swelling and tenderness, which she woke up with this morning. Slight runny nose, but no other symptoms. Dr. Darrall Dears came back to the infusion suite to evaluate patient's neck swelling. Antibiotics and CT of the neck ordered. Patient tolerated infusion and was monitored for 30 minutes post. Instructions and appointments given to patient. Patient verbalized understanding.

## 2022-03-23 ENCOUNTER — Inpatient Hospital Stay: Payer: PPO

## 2022-03-23 ENCOUNTER — Inpatient Hospital Stay (HOSPITAL_BASED_OUTPATIENT_CLINIC_OR_DEPARTMENT_OTHER): Payer: PPO | Admitting: Internal Medicine

## 2022-03-23 ENCOUNTER — Encounter: Payer: Self-pay | Admitting: Internal Medicine

## 2022-03-23 VITALS — BP 140/72 | HR 78 | Temp 97.6°F | Resp 16 | Wt 136.0 lb

## 2022-03-23 VITALS — BP 139/71 | HR 78 | Temp 97.1°F

## 2022-03-23 DIAGNOSIS — D509 Iron deficiency anemia, unspecified: Secondary | ICD-10-CM | POA: Diagnosis not present

## 2022-03-23 DIAGNOSIS — D508 Other iron deficiency anemias: Secondary | ICD-10-CM | POA: Diagnosis not present

## 2022-03-23 DIAGNOSIS — R590 Localized enlarged lymph nodes: Secondary | ICD-10-CM

## 2022-03-23 DIAGNOSIS — I889 Nonspecific lymphadenitis, unspecified: Secondary | ICD-10-CM

## 2022-03-23 DIAGNOSIS — D649 Anemia, unspecified: Secondary | ICD-10-CM

## 2022-03-23 DIAGNOSIS — R599 Enlarged lymph nodes, unspecified: Secondary | ICD-10-CM

## 2022-03-23 LAB — CBC WITH DIFFERENTIAL/PLATELET
Abs Immature Granulocytes: 0.03 10*3/uL (ref 0.00–0.07)
Basophils Absolute: 0 10*3/uL (ref 0.0–0.1)
Basophils Relative: 1 %
Eosinophils Absolute: 0.2 10*3/uL (ref 0.0–0.5)
Eosinophils Relative: 3 %
HCT: 31.7 % — ABNORMAL LOW (ref 36.0–46.0)
Hemoglobin: 10 g/dL — ABNORMAL LOW (ref 12.0–15.0)
Immature Granulocytes: 0 %
Lymphocytes Relative: 18 %
Lymphs Abs: 1.2 10*3/uL (ref 0.7–4.0)
MCH: 25.6 pg — ABNORMAL LOW (ref 26.0–34.0)
MCHC: 31.5 g/dL (ref 30.0–36.0)
MCV: 81.3 fL (ref 80.0–100.0)
Monocytes Absolute: 0.5 10*3/uL (ref 0.1–1.0)
Monocytes Relative: 7 %
Neutro Abs: 4.9 10*3/uL (ref 1.7–7.7)
Neutrophils Relative %: 71 %
Platelets: 163 10*3/uL (ref 150–400)
RBC: 3.9 MIL/uL (ref 3.87–5.11)
RDW: 18 % — ABNORMAL HIGH (ref 11.5–15.5)
WBC: 6.9 10*3/uL (ref 4.0–10.5)
nRBC: 0 % (ref 0.0–0.2)

## 2022-03-23 LAB — COMPREHENSIVE METABOLIC PANEL
ALT: 39 U/L (ref 0–44)
AST: 62 U/L — ABNORMAL HIGH (ref 15–41)
Albumin: 3.8 g/dL (ref 3.5–5.0)
Alkaline Phosphatase: 83 U/L (ref 38–126)
Anion gap: 8 (ref 5–15)
BUN: 13 mg/dL (ref 8–23)
CO2: 26 mmol/L (ref 22–32)
Calcium: 9.6 mg/dL (ref 8.9–10.3)
Chloride: 106 mmol/L (ref 98–111)
Creatinine, Ser: 0.59 mg/dL (ref 0.44–1.00)
GFR, Estimated: >60 mL/min (ref >60)
Glucose, Bld: 140 mg/dL — ABNORMAL HIGH (ref 70–99)
Potassium: 4.4 mmol/L (ref 3.5–5.1)
Sodium: 140 mmol/L (ref 135–145)
Total Bilirubin: 0.7 mg/dL (ref 0.3–1.2)
Total Protein: 6.4 g/dL — ABNORMAL LOW (ref 6.5–8.1)

## 2022-03-23 LAB — LACTATE DEHYDROGENASE: LDH: 168 U/L (ref 98–192)

## 2022-03-23 MED ORDER — SODIUM CHLORIDE 0.9% FLUSH
10.0000 mL | Freq: Once | INTRAVENOUS | Status: DC | PRN
  Filled 2022-03-23: qty 10

## 2022-03-23 MED ORDER — SODIUM CHLORIDE 0.9 % IV SOLN
510.0000 mg | Freq: Once | INTRAVENOUS | Status: AC
  Administered 2022-03-23: 510 mg via INTRAVENOUS
  Filled 2022-03-23: qty 17

## 2022-03-23 MED ORDER — AMOXICILLIN-POT CLAVULANATE 875-125 MG PO TABS: 1.0000 | ORAL_TABLET | Freq: Two times a day (BID) | ORAL | 0 refills | Status: AC

## 2022-03-23 MED ORDER — HEPARIN SOD (PORK) LOCK FLUSH 100 UNIT/ML IV SOLN
250.0000 [IU] | Freq: Once | INTRAVENOUS | Status: DC | PRN
  Filled 2022-03-23: qty 5

## 2022-03-23 MED ORDER — SODIUM CHLORIDE 0.9 % IV SOLN
Freq: Once | INTRAVENOUS | Status: AC
  Filled 2022-03-23: qty 250

## 2022-03-23 MED ORDER — ALTEPLASE 2 MG IJ SOLR
2.0000 mg | Freq: Once | INTRAMUSCULAR | Status: DC | PRN
  Filled 2022-03-23: qty 2

## 2022-03-23 MED ORDER — SODIUM CHLORIDE 0.9% FLUSH
3.0000 mL | Freq: Once | INTRAVENOUS | Status: DC | PRN
  Filled 2022-03-23: qty 3

## 2022-03-23 MED ORDER — HEPARIN SOD (PORK) LOCK FLUSH 100 UNIT/ML IV SOLN
500.0000 [IU] | Freq: Once | INTRAVENOUS | Status: DC | PRN
  Filled 2022-03-23: qty 5

## 2022-03-23 NOTE — Patient Instructions (Signed)
[facility]  Discharge Instructions: Thank you for choosing [facility] to provide your oncology and hematology care.  If you have a lab appointment with the Cancer Center, please go directly to the Cancer Center and check in at the registration area.  Wear comfortable clothing and clothing appropriate for easy access to any Portacath or PICC line.   We strive to give you quality time with your provider. You may need to reschedule your appointment if you arrive late (15 or more minutes).  Arriving late affects you and other patients whose appointments are after yours.  Also, if you miss three or more appointments without notifying the office, you may be dismissed from the clinic at the provider's discretion.      For prescription refill requests, have your pharmacy contact our office and allow 72 hours for refills to be completed.    Today you received the following chemotherapy and/or immunotherapy agents FERAHEME      To help prevent nausea and vomiting after your treatment, we encourage you to take your nausea medication as directed.  BELOW ARE SYMPTOMS THAT SHOULD BE REPORTED IMMEDIATELY: *FEVER GREATER THAN 100.4 F (38 C) OR HIGHER *CHILLS OR SWEATING *NAUSEA AND VOMITING THAT IS NOT CONTROLLED WITH YOUR NAUSEA MEDICATION *UNUSUAL SHORTNESS OF BREATH *UNUSUAL BRUISING OR BLEEDING *URINARY PROBLEMS (pain or burning when urinating, or frequent urination) *BOWEL PROBLEMS (unusual diarrhea, constipation, pain near the anus) TENDERNESS IN MOUTH AND THROAT WITH OR WITHOUT PRESENCE OF ULCERS (sore throat, sores in mouth, or a toothache) UNUSUAL RASH, SWELLING OR PAIN  UNUSUAL VAGINAL DISCHARGE OR ITCHING   Items with * indicate a potential emergency and should be followed up as soon as possible or go to the Emergency Department if any problems should occur.  Please show the CHEMOTHERAPY ALERT CARD or IMMUNOTHERAPY ALERT CARD at check-in to  the Emergency Department and triage nurse.  Should you have questions after your visit or need to cancel or reschedule your appointment, please contact [facility]  [phone] and follow the prompts.  Office hours are 8:00 a.m. to 4:30 p.m. Monday - Friday. Please note that voicemails left after 4:00 p.m. may not be returned until the following business day.  We are closed weekends and major holidays. You have access to a nurse at all times for urgent questions. Please call the main number to the clinic [phone] and follow the prompts.  For any non-urgent questions, you may also contact your provider using MyChart. We now offer e-Visits for anyone 18 and older to request care online for non-urgent symptoms. For details visit [email or web address].   Also download the MyChart app! Go to the app store, search "MyChart", open the app, select [facility], and log in with your MyChart username and password.  Masks are optional in the cancer centers. If you would like for your care team to wear a mask while they are taking care of you, please let them know. For doctor visits, patients may have with them one support person who is at least 70 years old. At this time, visitors are not allowed in the infusion area.  Ferumoxytol Injection What is this medication? FERUMOXYTOL (FER ue MOX i tol) treats low levels of iron in your body (iron deficiency anemia). Iron is a mineral that plays an important role in making red blood cells, which carry oxygen from your lungs to the rest of your body. This medicine may be used for other   purposes; ask your health care provider or pharmacist if you have questions. COMMON BRAND NAME(S): Feraheme What should I tell my care team before I take this medication? They need to know if you have any of these conditions: Anemia not caused by low iron levels High levels of iron in the blood Magnetic resonance imaging (MRI) test scheduled An  unusual or allergic reaction to iron, other medications, foods, dyes, or preservatives Pregnant or trying to get pregnant Breast-feeding How should I use this medication? This medication is for injection into a vein. It is given in a hospital or clinic setting. Talk to your care team the use of this medication in children. Special care may be needed. Overdosage: If you think you have taken too much of this medicine contact a poison control center or emergency room at once. NOTE: This medicine is only for you. Do not share this medicine with others. What if I miss a dose? It is important not to miss your dose. Call your care team if you are unable to keep an appointment. What may interact with this medication? Other iron products This list may not describe all possible interactions. Give your health care provider a list of all the medicines, herbs, non-prescription drugs, or dietary supplements you use. Also tell them if you smoke, drink alcohol, or use illegal drugs. Some items may interact with your medicine. What should I watch for while using this medication? Visit your care team regularly. Tell your care team if your symptoms do not start to get better or if they get worse. You may need blood work done while you are taking this medication. You may need to follow a special diet. Talk to your care team. Foods that contain iron include: whole grains/cereals, dried fruits, beans, or peas, leafy green vegetables, and organ meats (liver, kidney). What side effects may I notice from receiving this medication? Side effects that you should report to your care team as soon as possible: Allergic reactions--skin rash, itching, hives, swelling of the face, lips, tongue, or throat Low blood pressure--dizziness, feeling faint or lightheaded, blurry vision Shortness of breath Side effects that usually do not require medical attention (report to your care team if they continue or are  bothersome): Flushing Headache Joint pain Muscle pain Nausea Pain, redness, or irritation at injection site This list may not describe all possible side effects. Call your doctor for medical advice about side effects. You may report side effects to FDA at 1-800-FDA-1088. Where should I keep my medication? This medication is given in a hospital or clinic and will not be stored at home. NOTE: This sheet is a summary. It may not cover all possible information. If you have questions about this medicine, talk to your doctor, pharmacist, or health care provider.  2023 Elsevier/Gold Standard (2020-10-22 00:00:00)    

## 2022-03-23 NOTE — Progress Notes (Addendum)
[place]  [phone] [phone]) [phone]  ID: [name] OB: [birth year]  MR#: [id number]  NTZ#:[id number]  Patient Care Team: Eugenia Pancoast, [place] as PCP - General (Family Medicine) Chauncey Mann, MD as Referring Physician (Psychiatry) Vladimir Crofts, MD as Consulting Physician (Neurology) Germaine Pomfret, [facility] (Pharmacist) Yolonda Kida, MD as Consulting Physician (Cardiology)  REFERRING PROVIDER: Eugenia Pancoast, FNP  REASON FOR REFERRAL: Worsening anemia  HPI: Casey is a 71 y.o. female with past medical history of aortic stenosis status post bioprosthetic valve in May 2023, Barrett's esophagus, anxiety, and skin cancer was referred to hematology for further work-up of worsening anemia.  Patient reports feeling tired and low motivation to do her daily activities.  She has mild shortness of breath at rest and on exertion. She reports constipation and when she takes stool softener she gets diarrhea. Denies any bleeding in stool, urine, nose or gum bleeding. Endoscopy done in June 2017 by Dr. Vira Agar showed esophageal mucosal changes secondary to established short segment Barrett's esophagus, erythema and mild inflammation in the gastric antrum.  H. pylori was negative.  Colonoscopy showed polyps in descending, transverse colon and cecum..  Pathology showed hyperplastic polyp in the transverse colon.  Tubular adenoma in descending colon.  Labs reviewed.  Hemoglobin slowly trending down with most recent 8.7 from 03/08/2022.  WBC 7.  Platelets 162.  MCV 78. Vitamin B12 and folate normal.  TSH normal.  Iron studies ferritin 16, saturation 6%  INTERVAL HISTORY-  Patient was seen today as follow-up to reevaluate right neck swelling. Patient completed 7 days of Augmentin today and did notice improvement in the neck swelling.  However the palpable lymph node is the same.  Denies any fevers, chills.  Denies any difficulty breathing,  swallowing.  Has mild runny nose.  REVIEW OF SYSTEMS:   Review of Systems  Constitutional:  Negative for chills and fever.  Respiratory:         Runny nose Sore throat  Genitourinary: Negative.   Musculoskeletal: Negative.     As per HPI. Otherwise, a complete review of systems is negative.  PAST MEDICAL HISTORY: 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    PAST SURGICAL HISTORY: Past Surgical History:  Procedure Laterality Date   ANTERIOR CERVICAL DECOMP/DISCECTOMY FUSION N/A 11/15/2020   Procedure: ANTERIOR CERVICAL DECOMPRESSION/DISCECTOMY FUSION 1 LEVEL C3/4;  Surgeon: Deetta Perla, MD;  Location: [facility];  Service: Neurosurgery;  Laterality: N/A;   AORTIC VALVE REPLACEMENT N/A 09/19/2021   Procedure: AORTIC VALVE REPLACEMENT Using 82m Edwards Resilia Inspiris Valve;  Surgeon: BGaye Pollack MD;  [place]  Service: Open Heart Surgery;  Laterality: N/A;   AUGMENTATION MAMMAPLASTY Bilateral    breast implants   Bone Spur  2007   foot   CARDIAC CATHETERIZATION     Carpal Tunnel Symdrome  2008   as repeated in 2009   CATARACT EXTRACTION W/PHACO Left 01/16/2019   Procedure: CATARACT EXTRACTION PHACO AND INTRAOCULAR LENS PLACEMENT (IOC) LEFT DIABETES VISION BLUE;  Surgeon: Marchia Meiers, MD;  Location: [facility];  Service: Ophthalmology;  Laterality: Left;  [place]  00:47 CDE 6.62 Fluid pack lot # [id number]   CATARACT EXTRACTION W/PHACO Right 02/06/2019   Procedure: CATARACT EXTRACTION PHACO AND INTRAOCULAR LENS PLACEMENT (IOC);  Surgeon: Marchia Meiers, MD;  Location: [facility];  Service: Ophthalmology;   Laterality: Right;  [place] 00:54.6 CDE 6.52 FLUID PACK LOT # [id number]   CHOLECYSTECTOMY  2007   COLONOSCOPY WITH PROPOFOL N/A 11/17/2015   Procedure: COLONOSCOPY WITH PROPOFOL;  Surgeon: Manya Silvas, MD;  Location: [facility] ENDOSCOPY;  Service: Endoscopy;  Laterality: N/A;   CORONARY ARTERY BYPASS GRAFT N/A 09/19/2021   Procedure: CORONARY ARTERY BYPASS GRAFTING X1 Using Left Internal Mammory artery;  Surgeon: Gaye Pollack, MD;  Location: [place];  Service: Open Heart Surgery;  Laterality: N/A;   ESOPHAGOGASTRODUODENOSCOPY (EGD) WITH PROPOFOL N/A 11/17/2015   Procedure: ESOPHAGOGASTRODUODENOSCOPY (EGD) WITH PROPOFOL;  Surgeon: Manya Silvas, MD;  Location: [facility] ENDOSCOPY;  Service: Endoscopy;  Laterality: N/A;   INTRAVASCULAR PRESSURE WIRE/FFR STUDY N/A 05/24/2021   Procedure: INTRAVASCULAR PRESSURE WIRE/FFR STUDY;  Surgeon: Yolonda Kida, MD;  Location: [place] CV LAB;  Service: Cardiovascular;  Laterality: N/A;   MANDIBLE SURGERY     NASAL SINUS SURGERY  03/2013   Multile surgery Dr. Carlis Abbott. 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 Tamala Julian   RIGHT/LEFT HEART CATH AND CORONARY ANGIOGRAPHY N/A 05/24/2021   Procedure: RIGHT/LEFT HEART CATH AND CORONARY ANGIOGRAPHY;  Surgeon: Yolonda Kida, MD;  Location: [place] CV LAB;  Service: Cardiovascular;  Laterality: N/A;   TEE WITHOUT CARDIOVERSION N/A 09/19/2021   Procedure: TRANSESOPHAGEAL ECHOCARDIOGRAM (TEE);  Surgeon: Gaye Pollack, MD;  Location: [place];  Service: Open Heart Surgery;  Laterality: N/A;   TONSILLECTOMY  1960    FAMILY HISTORY: Family History  Problem Relation Age of Onset   Diabetes Father    Lung cancer Mother    Breast cancer Maternal Aunt    Irritable bowel syndrome Sister    Colon cancer Brother     HEALTH MAINTENANCE: Social History   Tobacco Use   Smoking status: Never   Smokeless tobacco: Never  Vaping Use   Vaping Use: Never used  Substance Use  Topics   Alcohol use: No   Drug use: No     Allergies  Allergen Reactions   Doxycycline Other (See Comments)    Emotional changes/anxious   Biaxin [Clarithromycin]     GI upset, and bad taste in mouth.    Current Outpatient Medications  Medication Sig Dispense Refill   acetaminophen (TYLENOL) 650 MG CR tablet Take 1,300 mg by mouth every 8 (eight) hours as needed for pain (pain).     amoxicillin-clavulanate (AUGMENTIN) 875-125 MG tablet Take 1 tablet by mouth 2 (two) times daily for 7 days. 14 tablet 0   apixaban (ELIQUIS) 5 MG TABS tablet Take 1 tablet (5 mg total) by mouth 2 (two) times daily. 60 tablet 2   aspirin EC 81 MG tablet Take 1 tablet (81 mg total) by mouth daily. Swallow whole.     atorvastatin (LIPITOR) 80 MG tablet      buPROPion (WELLBUTRIN SR) 150 MG 12 hr tablet Take 150 mg by mouth 2 (two) times daily.     butalbital-acetaminophen-caffeine (FIORICET) 50-325-40 MG tablet TAKE 1 TABLET BY MOUTH EVERY 6 HOURS AS NEEDED FOR HEADACHE (Patient taking differently: Take 1 tablet by mouth every 6 (six) hours as  needed for headache.) 30 tablet 4   ezetimibe (ZETIA) 10 MG tablet TAKE 1 TABLET BY MOUTH EVERYDAY AT BEDTIME (Patient taking differently: Take 10 mg by mouth at bedtime.) 90 tablet 0   fexofenadine (ALLEGRA) 180 MG tablet Take 180 mg by mouth daily as needed for allergies or rhinitis.     glipiZIDE (GLUCOTROL) 5 MG tablet Take 1 tablet (5 mg total) by mouth daily. (Patient taking differently: Take 5 mg by mouth daily. 0.5 tablet) 30 tablet 2   hydrOXYzine (VISTARIL) 25 MG capsule Take 25 mg by mouth daily as needed for anxiety.     irbesartan (AVAPRO) 75 MG tablet TAKE 1 TABLET BY MOUTH EVERY DAY 90 tablet 4   Lancet Devices MISC      loperamide (IMODIUM) 2 MG capsule Take 2-4 mg by mouth daily as needed for diarrhea or loose stools.     LYRICA 25 MG capsule Take 25 mg by mouth 2 (two) times daily.     metFORMIN (GLUCOPHAGE-XR) 500 MG 24 hr tablet TAKE 2 TABLETS BY  MOUTH EVERY DAY 180 tablet 3   metoprolol tartrate (LOPRESSOR) 50 MG tablet Take 1 tablet (50 mg total) by mouth 2 (two) times daily. 60 tablet 2   montelukast (SINGULAIR) 10 MG tablet Take 1 tablet (10 mg total) by mouth at bedtime as needed (allergies). Home med. (Patient not taking: Reported on 03/13/2022)     Olopatadine HCl 0.7 % SOLN Place 1 drop into both eyes daily as needed (allergies).     ondansetron (ZOFRAN ODT) 4 MG disintegrating tablet Take 1 tablet (4 mg total) by mouth every 8 (eight) hours as needed for nausea or vomiting. 20 tablet 0   promethazine (PHENERGAN) 25 MG tablet Take 25 mg by mouth every 8 (eight) hours as needed.     RABEprazole (ACIPHEX) 20 MG tablet TAKE 1 TABLET BY MOUTH EVERY DAY 90 tablet 4   sertraline (ZOLOFT) 100 MG tablet Take 2 tablets (200 mg total) by mouth daily.     simethicone (MYLICON) 80 MG chewable tablet Chew 80 mg by mouth every 6 (six) hours as needed for flatulence.     No current facility-administered medications for this visit.   Facility-Administered Medications Ordered in Other Visits  Medication Dose Route Frequency Provider Last Rate Last Admin   alteplase (CATHFLO ACTIVASE) injection 2 mg  2 mg Intracatheter Once PRN Jane Canary, MD       heparin lock flush 100 unit/mL  500 Units Intracatheter Once PRN Jane Canary, MD       heparin lock flush 100 unit/mL  250 Units Intracatheter Once PRN Jane Canary, MD       sodium chloride flush (NS) 0.9 % injection 10 mL  10 mL Intracatheter Once PRN Jane Canary, MD       sodium chloride flush (NS) 0.9 % injection 3 mL  3 mL Intracatheter Once PRN Jane Canary, MD        OBJECTIVE: There were no vitals filed for this visit.    There is no height or weight on file to calculate BMI.      General: Well-developed, well-nourished, no acute distress. Eyes: Pink conjunctiva, anicteric sclera. HEENT: Oropharynx clear Lungs: Clear to auscultation bilaterally. Heart: Regular rate and  rhythm. No rubs, murmurs, or gallops. Abdomen: Soft, nontender, nondistended. No organomegaly noted, normoactive bowel sounds. Musculoskeletal: No edema, cyanosis, or clubbing. Neuro: Alert, answering all questions appropriately. Cranial nerves grossly intact. Skin: No rashes or petechiae noted. Psych: Normal affect.  Lymphatics: Right-sided neck swelling has improved.  Continues to have palpable about 2 to 3 cm right cervical lymph node.  Tenderness improved.   LAB RESULTS:  Lab Results  Component Value Date   NA 139 03/08/2022   K 4.3 03/08/2022   CL 103 03/08/2022   CO2 30 03/08/2022   GLUCOSE 146 (H) 03/08/2022   BUN 15 03/08/2022   CREATININE 0.71 03/08/2022   CALCIUM 9.2 03/08/2022   PROT 6.4 03/08/2022   ALBUMIN 4.2 03/08/2022   AST 25 03/08/2022   ALT 18 03/08/2022   ALKPHOS 89 03/08/2022   BILITOT 0.7 03/08/2022   GFRNONAA >60 11/02/2021   GFRAA 89 11/19/2019    Lab Results  Component Value Date   WBC 7.0 03/08/2022   NEUTROABS 6.5 10/25/2021   HGB 8.7 (L) 03/08/2022   HCT 27.1 (L) 03/08/2022   MCV 78.1 03/08/2022   PLT 162.0 03/08/2022    Lab Results  Component Value Date   TIBC 463 (H) 03/13/2022   FERRITIN 16 03/13/2022   IRONPCTSAT 6 (L) 03/13/2022     STUDIES: CT Abdomen Pelvis W Contrast  Result Date: 03/10/2022 CLINICAL DATA:  RLQ abdominal pain (Age >= 14y) mild ruq abdominal tenderness EXAM: CT ABDOMEN AND PELVIS WITH CONTRAST TECHNIQUE: Multidetector CT imaging of the abdomen and pelvis was performed using the standard protocol following bolus administration of intravenous contrast. RADIATION DOSE REDUCTION: This exam was performed according to the departmental dose-optimization program which includes automated exposure control, adjustment of the mA and/or kV according to patient size and/or use of iterative reconstruction technique. CONTRAST:  134m OMNIPAQUE IOHEXOL 300 MG/ML  SOLN COMPARISON:  CTA chest 10/31/2021, CTA abdomen 06/23/2021  FINDINGS: Lower chest: No pleural or pericardial effusion. Previous AVR. Coronary calcifications. Mitral annulus calcifications. Visualized lung bases clear. Bilateral breast implants partially visualized. Hepatobiliary: No focal liver abnormality is seen. Status post cholecystectomy. No biliary dilatation. Pancreas: Unremarkable. No pancreatic ductal dilatation or surrounding inflammatory changes. Spleen: Normal in size without focal abnormality. Adrenals/Urinary Tract: Adrenal glands are unremarkable. Kidneys are normal, without renal calculi, focal lesion, or hydronephrosis. Bladder is unremarkable. Stomach/Bowel: Stomach is nondistended. Small bowel nondilated, unremarkable. Normal appendix. The colon is incompletely distended, unremarkable. Vascular/Lymphatic: Mild aortoiliac calcified atheromatous plaque without aneurysm or evident stenosis. No abdominal or pelvic adenopathy. Reproductive: Uterus and bilateral adnexa are unremarkable. Other: Bilateral pelvic phleboliths.  No ascites.  No free air. Musculoskeletal: Sternotomy wires. Lumbar degenerative disc disease most marked L2-4. No acute findings. IMPRESSION: 1. No acute findings. Normal appendix. 2. Post cholecystectomy. 3. Coronary and aortic Atherosclerosis (ICD10-170.0). Electronically Signed   By: DLucrezia EuropeM.D.   On: 03/10/2022 15:57    ASSESSMENT AND PLAN:   Katie DOUBRAVAis a 71y.o. female with pmh of aortic stenosis status post bioprosthetic valve in May 2023, Barrett's esophagus, anxiety, and skin cancer was referred to hematology for further work-up of worsening anemia.  #Right cervical lymphadenitis -Started 1 week ago.  Completed an empiric trial of Augmentin for 7 days.  There is improvement in overall right neck swelling however continues to have palpable 2 to 3 cm right cervical lymph node.  Tenderness is improved.  Denies any fevers or chills.  Due to improvement in her symptoms I will consider another 7 days of Augmentin to  complete total 14 days.  Prescription sent.  She is scheduled for CT neck with contrast on 03/30/2022.  I will follow-up with her the next day.  We will obtain CBC with differential,  CMP and LDH today.  # Microcytic anemia  - Progressive. - Labs reviewed.  Hemoglobin slowly trending down with most recent 8.7 from 03/08/2022.  WBC 7.  Platelets 162.  MCV 78. - Vitamin B12 and folate normal.  TSH normal. -Iron studies showed ferritin 16, saturation 6%.  Tolerated first cycle of Feraheme well. -Patient denies any bleeding. Endoscopy done in June 2017 by Dr. Vira Agar showed esophageal mucosal changes secondary to established short segment Barrett's esophagus, erythema and mild inflammation in the gastric antrum.  H. pylori was negative.  Colonoscopy showed polyps in descending, transverse colon and cecum..  Pathology showed hyperplastic polyp in the transverse colon.  Tubular adenoma in descending colon.   -Scheduled for colonoscopy in November  -Completed 2 infusions of IV Feraheme on 03/23/2022.  RTC in 1 week for MD visit to discuss CT results.    Patient expressed understanding and was in agreement with this plan. She also understands that She can call clinic at any time with any questions, concerns, or complaints.   I spent a total of 35 minutes reviewing chart data, face-to-face evaluation with the patient, counseling and coordination of care as detailed above.  Jane Canary, MD   03/23/2022 2:01 PM

## 2022-03-23 NOTE — Progress Notes (Signed)
Pt received iron infusion today.  Pt reports finished antibiotic this morning for swelling/lump to right side of neck. Pt daughter present today as well.

## 2022-03-29 ENCOUNTER — Encounter: Payer: Self-pay | Admitting: *Deleted

## 2022-03-29 ENCOUNTER — Encounter: Payer: PPO | Attending: Internal Medicine | Admitting: *Deleted

## 2022-03-29 ENCOUNTER — Other Ambulatory Visit: Payer: Self-pay | Admitting: Family Medicine

## 2022-03-29 ENCOUNTER — Other Ambulatory Visit: Payer: Self-pay | Admitting: Family

## 2022-03-29 DIAGNOSIS — Z951 Presence of aortocoronary bypass graft: Secondary | ICD-10-CM

## 2022-03-29 DIAGNOSIS — Z952 Presence of prosthetic heart valve: Secondary | ICD-10-CM

## 2022-03-29 MED ORDER — ONDANSETRON 4 MG PO TBDP: 4.0000 mg | ORAL_TABLET | Freq: Three times a day (TID) | ORAL | 3 refills | Status: DC | PRN

## 2022-03-29 NOTE — Progress Notes (Signed)
Virtual orientation call completed today. shehas an appointment on Date: 04/10/2022  for EP eval and gym Orientation.  Documentation of diagnosis can be found in [facility] Date: 09/19/2021 .

## 2022-03-30 ENCOUNTER — Ambulatory Visit
Admission: RE | Admit: 2022-03-30 | Discharge: 2022-03-30 | Disposition: A | Discharge status: Home / Self Care | Payer: PPO | Source: Ambulatory Visit | Attending: Internal Medicine | Admitting: Internal Medicine

## 2022-03-30 DIAGNOSIS — R221 Localized swelling, mass and lump, neck: Secondary | ICD-10-CM | POA: Insufficient documentation

## 2022-03-30 DIAGNOSIS — E041 Nontoxic single thyroid nodule: Secondary | ICD-10-CM | POA: Diagnosis not present

## 2022-03-30 DIAGNOSIS — I771 Stricture of artery: Secondary | ICD-10-CM | POA: Diagnosis not present

## 2022-03-30 DIAGNOSIS — I6523 Occlusion and stenosis of bilateral carotid arteries: Secondary | ICD-10-CM | POA: Diagnosis not present

## 2022-03-30 DIAGNOSIS — Z9889 Other specified postprocedural states: Secondary | ICD-10-CM | POA: Diagnosis not present

## 2022-03-30 MED ORDER — IOHEXOL 300 MG/ML  SOLN
75.0000 mL | Freq: Once | INTRAMUSCULAR | Status: AC | PRN
  Administered 2022-03-30: 75 mL via INTRAVENOUS

## 2022-03-30 NOTE — Telephone Encounter (Signed)
During visit pt was not sure if she was taking lyrica, however, now I am receiving a refill request for this. Can pt clarify if she is taking lyrica and for what she is taking it for?

## 2022-03-31 ENCOUNTER — Inpatient Hospital Stay (HOSPITAL_BASED_OUTPATIENT_CLINIC_OR_DEPARTMENT_OTHER): Payer: PPO | Admitting: Internal Medicine

## 2022-03-31 ENCOUNTER — Encounter: Payer: Self-pay | Admitting: Internal Medicine

## 2022-03-31 VITALS — BP 165/68 | HR 58 | Temp 97.7°F | Ht 60.0 in | Wt 131.0 lb

## 2022-03-31 DIAGNOSIS — D509 Iron deficiency anemia, unspecified: Secondary | ICD-10-CM | POA: Diagnosis not present

## 2022-03-31 DIAGNOSIS — D508 Other iron deficiency anemias: Secondary | ICD-10-CM

## 2022-03-31 DIAGNOSIS — I889 Nonspecific lymphadenitis, unspecified: Secondary | ICD-10-CM | POA: Diagnosis not present

## 2022-03-31 NOTE — Progress Notes (Signed)
Patient here for follow up. Patient denies any concerns today. ?

## 2022-03-31 NOTE — Progress Notes (Signed)
[place]  [phone] [phone]  ID: Katie Buckley OB: 09/10/1950  MR#: [id number]  TWS#:[id number]  Patient Care Team: Eugenia Pancoast, [place] as PCP - General (Family Medicine) Chauncey Mann, MD as Referring Physician (Psychiatry) Vladimir Crofts, MD as Consulting Physician (Neurology) Germaine Pomfret, [facility] (Pharmacist) Yolonda Kida, MD as Consulting Physician (Cardiology)  REFERRING PROVIDER: Eugenia Pancoast, FNP  REASON FOR REFERRAL: Worsening anemia  HPI: Wise is a 71 y.o. female with past medical history of aortic stenosis status post bioprosthetic valve in May 2023, Barrett's esophagus, anxiety, and skin cancer was referred to hematology for further work-up of worsening anemia.  Patient reports feeling tired and low motivation to do her daily activities.  She has mild shortness of breath at rest and on exertion. She reports constipation and when she takes stool softener she gets diarrhea. Denies any bleeding in stool, urine, nose or gum bleeding. Endoscopy done in June 2017 by Dr. Vira Agar showed esophageal mucosal changes secondary to established short segment Barrett's esophagus, erythema and mild inflammation in the gastric antrum.  H. pylori was negative.  Colonoscopy showed polyps in descending, transverse colon and cecum..  Pathology showed hyperplastic polyp in the transverse colon.  Tubular adenoma in descending colon.  Labs reviewed.  Hemoglobin slowly trending down with most recent 8.7 from 03/08/2022.  WBC 7.  Platelets 162.  MCV 78. Vitamin B12 and folate normal.  TSH normal.  Iron studies ferritin 16, saturation 6%  When patient presented post Feraheme she complained about neck swelling and pain.  She was started on Augmentin and CT neck was ordered. CT neck with contrast done on 03/30/2022 showed questionable small but mildly inflamed nodes level 1 level 2 lymph node.  No underlying lymphadenopathy, neck masses  or other inflammation.  INTERVAL HISTORY-  Patient was seen today as follow-up to discuss CT soft tissue neck. She she was able to pick up 7 more days of antibiotics that was sent.  She has about 3 more days left.  She reports diarrhea for couple of weeks, about 3 episodes every day.  This is a chronic issue for her on and off.  However this time it has been there for longer.  Denies any abdominal bleeding or blood in the stool.  No fevers or chills.  Her right neck swelling has remarkably improved with antibiotics.  REVIEW OF SYSTEMS:   Review of Systems  Constitutional:  Negative for chills, fever and malaise/fatigue.  Respiratory:  Negative for cough, hemoptysis and shortness of breath.        Runny nose Sore throat  Gastrointestinal:  Positive for diarrhea. Negative for abdominal pain, blood in stool, constipation, nausea and vomiting.  Genitourinary: Negative.   Musculoskeletal: Negative.   Neurological:  Negative for dizziness.    As per HPI. Otherwise, a complete review of systems is negative.  PAST MEDICAL HISTORY: 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    PAST SURGICAL HISTORY: Past Surgical History:  Procedure Laterality Date   ANTERIOR  CERVICAL DECOMP/DISCECTOMY FUSION N/A 11/15/2020   Procedure: ANTERIOR CERVICAL DECOMPRESSION/DISCECTOMY FUSION 1 LEVEL C3/4;  Surgeon: Deetta Perla, MD;  Location: [facility];  Service: Neurosurgery;  Laterality: N/A;   AORTIC VALVE REPLACEMENT N/A 09/19/2021   Procedure: AORTIC VALVE REPLACEMENT Using 19m Edwards Resilia Inspiris Valve;  Surgeon: BGaye Pollack MD;   [place]  Service: Open Heart Surgery;  Laterality: N/A;   AUGMENTATION MAMMAPLASTY Bilateral    breast implants   Bone Spur  2007   foot   CARDIAC CATHETERIZATION     Carpal Tunnel Symdrome  2008   as repeated in 2009   CATARACT EXTRACTION W/PHACO Left 01/16/2019   Procedure: CATARACT EXTRACTION PHACO AND INTRAOCULAR LENS PLACEMENT ([place] DIABET[place] VISION BLUE;  Surgeon: HMarchia Meiers MD;  Location: [facility];  Service: Ophthalmology;  Laterality: Left;  [place] 00:47 CDE 6.62 Fluid pack lot # [id number]   CATARACT EXTRACTION W/PHACO Right 02/06/2019   Procedure: CATARACT EXTRACTION PHACO AND INTRAOCULAR LENS PLACEMENT (IOC);  Surgeon: HMarchia Meiers MD;  Location: [facility];  Service: Ophthalmology;  Laterality: Right;  [place]00:54.6 CDE 6.52 FLUID PACK LOT # [id number]   CHOLECYSTECTOMY  2007   COLONOSCOPY WITH PROPOFOL N/A 11/17/2015   Procedure: COLONOSCOPY WITH PROPOFOL;  Surgeon: RManya Silvas MD;  [facility]  Service: Endoscopy;  Laterality: N/A;   CORONARY ARTERY BYPASS GRAFT N/A 09/19/2021   Procedure: CORONARY ARTERY BYPASS GRAFTING X1 Using Left Internal Mammory artery;  Surgeon: BGaye Pollack MD;  [place]  Service: Open Heart Surgery;  Laterality: N/A;   ESOPHAGOGASTRODUODENOSCOPY (EGD) WITH PROPOFOL N/A 11/17/2015   Procedure: ESOPHAGOGASTRODUODENOSCOPY (EGD) WITH PROPOFOL;  Surgeon: RManya Silvas MD;  [facility];  Service: Endoscopy;  Laterality: N/A;   INTRAVASCULAR PRESSURE WIRE/FFR STUDY N/A 05/24/2021   Procedure: INTRAVASCULAR PRESSURE WIRE/FFR STUDY;  Surgeon: CYolonda Kida MD;  [facility];  Service: Cardiovascular;  Laterality: N/A;   MANDIBLE SURGERY     NASAL SINUS SURGERY  03/2013   Multile surgery 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 STamala Julian  RIGHT/LEFT HEART CATH AND CORONARY ANGIOGRAPHY N/A 05/24/2021   Procedure: RIGHT/LEFT HEART  CATH AND CORONARY ANGIOGRAPHY;  Surgeon: CYolonda Kida MD;  [facility];  Service: Cardiovascular;  Laterality: N/A;   TEE WITHOUT CARDIOVERSION N/A 09/19/2021   Procedure: TRANSESOPHAGEAL ECHOCARDIOGRAM (TEE);  Surgeon: BGaye Pollack MD;  [place]  Service: Open Heart Surgery;  Laterality: N/A;   TONSILLECTOMY  1960    FAMILY HISTORY: Family History  Problem Relation Age of Onset   Diabetes Father    Lung cancer Mother    Breast cancer Maternal Aunt    Irritable bowel syndrome Sister    Colon cancer Brother     HEALTH MAINTENANCE: Social History   Tobacco Use   Smoking status: Never   Smokeless tobacco: Never  Vaping Use   Vaping Use: Never used  Substance Use Topics   Alcohol use: No   Drug use: No     Allergies  Allergen Reactions   Doxycycline Other (See Comments)    Emotional changes/anxious   Biaxin [Clarithromycin]     GI upset, and bad taste in mouth.    Current Outpatient Medications  Medication Sig Dispense Refill   acetaminophen (TYLENOL) 650 MG CR tablet Take 1,300 mg by mouth every 8 (eight) hours as needed for pain (pain).  atorvastatin (LIPITOR) 80 MG tablet      buPROPion (WELLBUTRIN SR) 150 MG 12 hr tablet Take 150 mg by mouth 2 (two) times daily.     butalbital-acetaminophen-caffeine (FIORICET) 50-325-40 MG tablet TAKE 1 TABLET BY MOUTH EVERY 6 HOURS AS NEEDED FOR HEADACHE (Patient taking differently: Take 1 tablet by mouth every 6 (six) hours as needed for headache.) 30 tablet 4   ezetimibe (ZETIA) 10 MG tablet TAKE 1 TABLET BY MOUTH EVERYDAY AT BEDTIME (Patient taking differently: Take 10 mg by mouth at bedtime.) 90 tablet 0   fexofenadine (ALLEGRA) 180 MG tablet Take 180 mg by mouth daily as needed for allergies or rhinitis.     glipiZIDE (GLUCOTROL) 5 MG tablet Take 1 tablet (5 mg total) by mouth daily. (Patient taking differently: Take 5 mg by mouth daily. 0.5 tablet) 30 tablet 2   hydrOXYzine (VISTARIL) 25  MG capsule Take 25 mg by mouth daily as needed for anxiety.     irbesartan (AVAPRO) 75 MG tablet TAKE 1 TABLET BY MOUTH EVERY DAY 90 tablet 4   Lancet Devices MISC      loperamide (IMODIUM) 2 MG capsule Take 2-4 mg by mouth daily as needed for diarrhea or loose stools.     LYRICA 25 MG capsule Take 25 mg by mouth 2 (two) times daily.     metFORMIN (GLUCOPHAGE-XR) 500 MG 24 hr tablet TAKE 2 TABLETS BY MOUTH EVERY DAY 180 tablet 3   metoprolol tartrate (LOPRESSOR) 50 MG tablet Take 1 tablet (50 mg total) by mouth 2 (two) times daily. 60 tablet 2   ondansetron (ZOFRAN ODT) 4 MG disintegrating tablet Take 1 tablet (4 mg total) by mouth every 8 (eight) hours as needed for nausea or vomiting. 20 tablet 3   RABEprazole (ACIPHEX) 20 MG tablet TAKE 1 TABLET BY MOUTH EVERY DAY 90 tablet 4   sertraline (ZOLOFT) 100 MG tablet Take 2 tablets (200 mg total) by mouth daily.     simethicone (MYLICON) 80 MG chewable tablet Chew 80 mg by mouth every 6 (six) hours as needed for flatulence.     apixaban (ELIQUIS) 5 MG TABS tablet Take 1 tablet (5 mg total) by mouth 2 (two) times daily. 60 tablet 2   montelukast (SINGULAIR) 10 MG tablet Take 1 tablet (10 mg total) by mouth at bedtime as needed (allergies). Home med. (Patient not taking: Reported on 03/13/2022)     Olopatadine HCl 0.7 % SOLN Place 1 drop into both eyes daily as needed (allergies). (Patient not taking: Reported on 03/23/2022)     promethazine (PHENERGAN) 25 MG tablet Take 25 mg by mouth every 8 (eight) hours as needed. (Patient not taking: Reported on 03/23/2022)     No current facility-administered medications for this visit.    OBJECTIVE: Vitals:   03/31/22 1335  BP: (!) 165/68  Pulse: (!) 58  Temp: 97.7 F (36.5 C)      Body mass index is 25.58 kg/m.      General: Well-developed, well-nourished, no acute distress. Eyes: Pink conjunctiva, anicteric sclera. HEENT: Oropharynx clear Lungs: Clear to auscultation bilaterally. Heart: Regular  rate and rhythm. No rubs, murmurs, or gallops. Abdomen: Soft, nontender, nondistended. No organomegaly noted, normoactive bowel sounds. Musculoskeletal: No edema, cyanosis, or clubbing. Neuro: Alert, answering all questions appropriately. Cranial nerves grossly intact. Skin: No rashes or petechiae noted. Psych: Normal affect. Lymphatics: Right-sided neck swelling has improved.  Continues to have palpable about 2 to 3 cm right cervical lymph node.  Tenderness improved.  LAB RESULTS:  Lab Results  Component Value Date   NA 140 03/23/2022   K 4.4 03/23/2022   CL 106 03/23/2022   CO2 26 03/23/2022   GLUCOSE 140 (H) 03/23/2022   BUN 13 03/23/2022   CREATININE 0.59 03/23/2022   CALCIUM 9.6 03/23/2022   PROT 6.4 (L) 03/23/2022   ALBUMIN 3.8 03/23/2022   AST 62 (H) 03/23/2022   ALT 39 03/23/2022   ALKPHOS 83 03/23/2022   BILITOT 0.7 03/23/2022   GFRNONAA >60 03/23/2022   GFRAA 89 11/19/2019    Lab Results  Component Value Date   WBC 6.9 03/23/2022   NEUTROABS 4.9 03/23/2022   HGB 10.0 (L) 03/23/2022   HCT 31.7 (L) 03/23/2022   MCV 81.3 03/23/2022   PLT 163 03/23/2022    Lab Results  Component Value Date   TIBC 463 (H) 03/13/2022   FERRITIN 16 03/13/2022   IRONPCTSAT 6 (L) 03/13/2022     STUDIES: CT SOFT TISSUE NECK W CONTRAST  Result Date: 03/31/2022 CLINICAL DATA:  71 year old female with right side neck pain and swelling for several weeks, "Getting better". History of cervical spine fusion. EXAM: CT NECK WITH CONTRAST TECHNIQUE: Multidetector CT imaging of the neck was performed using the standard protocol following the bolus administration of intravenous contrast. RADIATION DOSE REDUCTION: This exam was performed according to the departmental dose-optimization program which includes automated exposure control, adjustment of the mA and/or kV according to patient size and/or use of iterative reconstruction technique. CONTRAST:  62m OMNIPAQUE IOHEXOL 300 MG/ML  SOLN  COMPARISON:  Postoperative cervical spine radiographs 11/15/2020. Preoperative cervical spine MRI 04/08/2020. FINDINGS: Pharynx and larynx: Partially retropharyngeal course of the right ICA (series 2, image 29, normal variant). Otherwise normal retropharyngeal space. Mild motion artifact. Laryngeal and pharyngeal soft tissue contours are within normal limits. Normal parapharyngeal spaces. Salivary glands: Dental and mandible streak artifact but the sublingual space appears to remain normal. Submandibular and parotid glands are symmetric and within normal limits. Thyroid: Negative; subcentimeter left thyroid lobe hypodense nodule Not clinically significant; no follow-up imaging recommended (ref: J Am Coll Radiol. 2015 Feb;12(2): 143-50). Lymph nodes: Marked area of clinical concern along the right lateral neck series 2, image 46 and coronal image 64 at the level of the hyoid bone. This is overlying the right external jugular vein. Small regional right level 1 and/or level 2 lymph nodes are up to 5-6 mm short axis, normal by size criteria, and fairly symmetric (4-5 mm short axis on the left series 2, image 45. however, there is questionable mild adjacent soft tissue inflammation at the node on series 2 image 41, 43 deep to the platysma. But this is subtle and could be artifact due to motion. No other regional inflammation. No cervical lymphadenopathy. Vascular: Major vascular structures in the neck and at the skull base are patent including the right IJ. Carotid tortuosity with the partially retropharyngeal course on the right. Mild for age cervical carotid atherosclerosis. Limited intracranial: Negative. Visualized orbits: Postoperative changes to both globes. Mastoids and visualized paranasal sinuses: Previous bilateral paranasal sinus surgery. No significant residual sinus fluid level or mucosal thickening. Tympanic cavities and mastoids appear well aerated. Skeleton: Previous bilateral mandible ORIF. Previous  C3-C4 cervical ACDF. 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 Previous sternotomy. No acute osseous abnormality identified. Upper chest: Calcified aortic atherosclerosis. Mild respiratory motion, otherwise negative lung apices. No superior mediastinal lymphadenopathy. Visible axillary lymph nodes are normal. IMPRESSION: 1.  Questionable small but mildly inflamed right level 1/level 2 lymph node at the right lateral neck marked area of clinical concern (series 2, image 43). But no underlying lymphadenopathy, neck mass, or other inflammation. Recommend follow-up by clinical exam, with repeat Neck CT (IV contrast preferred) if the area further enlarges or becomes painful. 2. 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 3. Aortic Atherosclerosis (ICD10-I70.0). Electronically Signed   By: Genevie Ann M.D.   On: 03/31/2022 12:34   CT Abdomen Pelvis W Contrast  Result Date: 03/10/2022 CLINICAL DATA:  RLQ abdominal pain (Age >= 14y) mild ruq abdominal tenderness EXAM: CT ABDOMEN AND PELVIS WITH CONTRAST TECHNIQUE: Multidetector CT imaging of the abdomen and pelvis was performed using the standard protocol following bolus administration of intravenous contrast. RADIATION DOSE REDUCTION: This exam was performed according to the departmental dose-optimization program which includes automated exposure control, adjustment of the mA and/or kV according to patient size and/or use of iterative reconstruction technique. CONTRAST:  168m OMNIPAQUE IOHEXOL 300 MG/ML  SOLN COMPARISON:  CTA chest 10/31/2021, CTA abdomen 06/23/2021 FINDINGS: Lower chest: No pleural or pericardial effusion. Previous AVR. Coronary calcifications. Mitral annulus calcifications. Visualized lung bases clear. Bilateral breast implants partially visualized. Hepatobiliary: No focal liver abnormality is seen. Status post cholecystectomy. No  biliary dilatation. Pancreas: Unremarkable. No pancreatic ductal dilatation or surrounding inflammatory changes. Spleen: Normal in size without focal abnormality. Adrenals/Urinary Tract: Adrenal glands are unremarkable. Kidneys are normal, without renal calculi, focal lesion, or hydronephrosis. Bladder is unremarkable. Stomach/Bowel: Stomach is nondistended. Small bowel nondilated, unremarkable. Normal appendix. The colon is incompletely distended, unremarkable. Vascular/Lymphatic: Mild aortoiliac calcified atheromatous plaque without aneurysm or evident stenosis. No abdominal or pelvic adenopathy. Reproductive: Uterus and bilateral adnexa are unremarkable. Other: Bilateral pelvic phleboliths.  No ascites.  No free air. Musculoskeletal: Sternotomy wires. Lumbar degenerative disc disease most marked L2-4. No acute findings. IMPRESSION: 1. No acute findings. Normal appendix. 2. Post cholecystectomy. 3. Coronary and aortic Atherosclerosis (ICD10-170.0). Electronically Signed   By: DLucrezia EuropeM.D.   On: 03/10/2022 15:57    ASSESSMENT AND PLAN:   VSKYLAN GIFTis a 71y.o. female with pmh of aortic stenosis status post bioprosthetic valve in May 2023, Barrett's esophagus, anxiety, and skin cancer was referred to hematology for further work-up of worsening anemia.  #Right cervical lymphadenitis -Started 2 weeks ago.  Treated empirically with Augmentin for 2 weeks.  She has about 3 more days left.  Neck swelling and palpable lymph node has dramatically improved.  She has been having diarrhea for couple of weeks 3 episodes per day.  It is a chronic issue for her on and off.  However it has been little longer this time.  Advised her to stop antibiotics tomorrow.  To add probiotics.  She has container to collect stool given by her PCP.   - CT neck with contrast done on 03/30/2022 showed questionable small but mildly inflamed nodes level 1 level 2 lymph node.  No underlying lymphadenopathy, neck masses or other  inflammation.  LDH normal.  # Microcytic anemia  - Hemoglobin of 8.7 from 03/08/2022.  WBC 7.  Platelets 162.  MCV 78.   - Vitamin B12 and folate normal.  TSH normal. -Iron studies showed ferritin 16, saturation 6%.   -Patient denies any bleeding. Endoscopy done in June 2017 by Dr. EVira Agarshowed esophageal mucosal changes secondary to established short segment Barrett's esophagus, erythema and mild inflammation in the gastric antrum.  H. pylori was  negative.  Colonoscopy showed polyps in descending, transverse colon and cecum..  Pathology showed hyperplastic polyp in the transverse colon.  Tubular adenoma in descending colon.   -Scheduled for colonoscopy in November  -Completed 2 infusions of IV Feraheme on 03/23/2022. Hemoglobin improved to 10 last week after iron infusion.  RTC in 4 weeks for MD visit, labs to check response to iron     Patient expressed understanding and was in agreement with this plan. She also understands that She can call clinic at any time with any questions, concerns, or complaints.   I spent a total of 35 minutes reviewing chart data, face-to-face evaluation with the patient, counseling and coordination of care as detailed above.  Jane Canary, MD   03/31/2022 4:46 PM

## 2022-04-04 DIAGNOSIS — F331 Major depressive disorder, recurrent, moderate: Secondary | ICD-10-CM | POA: Diagnosis not present

## 2022-04-04 DIAGNOSIS — F411 Generalized anxiety disorder: Secondary | ICD-10-CM | POA: Diagnosis not present

## 2022-04-05 NOTE — Telephone Encounter (Signed)
Pt is taking Lyrica and stated she this is for her heart.

## 2022-04-05 NOTE — Telephone Encounter (Signed)
Left message to return call to our office.  

## 2022-04-05 NOTE — Telephone Encounter (Signed)
Lyrica is not given for heart it is more for chronic nerve pains or aches and pains of the body. It looks like she has been getting this from a Dr. Malen Gauze? This is a Publishing rights manager. This is likely where she would get this from or is she no longer seeing neurosurgeon?

## 2022-04-10 ENCOUNTER — Ambulatory Visit: Payer: PPO

## 2022-04-12 ENCOUNTER — Ambulatory Visit: Payer: PPO | Admitting: Anesthesiology

## 2022-04-12 ENCOUNTER — Encounter
Admission: RE | Disposition: A | Discharge status: Home / Self Care | Payer: Self-pay | Source: Home / Self Care | Attending: Gastroenterology

## 2022-04-12 ENCOUNTER — Ambulatory Visit
Admission: RE | Admit: 2022-04-12 | Discharge: 2022-04-12 | Disposition: A | Discharge status: Home / Self Care | Payer: PPO | Attending: Gastroenterology | Admitting: Gastroenterology

## 2022-04-12 DIAGNOSIS — Z86010 Personal history of colonic polyps: Secondary | ICD-10-CM | POA: Diagnosis not present

## 2022-04-12 DIAGNOSIS — Z5309 Procedure and treatment not carried out because of other contraindication: Secondary | ICD-10-CM | POA: Insufficient documentation

## 2022-04-12 LAB — GLUCOSE, CAPILLARY: Glucose-Capillary: 187 mg/dL — ABNORMAL HIGH (ref 70–99)

## 2022-04-12 SURGERY — COLONOSCOPY WITH PROPOFOL
Anesthesia: General

## 2022-04-12 NOTE — Anesthesia Preprocedure Evaluation (Signed)
Anesthesia Evaluation  Patient identified by MRN, date of birth, ID band Patient awake    Reviewed: Allergy & Precautions, NPO status , Patient's Chart, lab work & pertinent test results  History of Anesthesia Complications Negative for: history of anesthetic complications  Airway Mallampati: III  TM Distance: <3 FB Neck ROM: full    Dental  (+) Chipped, Poor Dentition   Pulmonary neg shortness of breath, asthma ,    Pulmonary exam normal        Cardiovascular hypertension, (-) angina+ Peripheral Vascular Disease, +CHF and + DOE  Normal cardiovascular exam+ Valvular Problems/Murmurs      Neuro/Psych  Headaches, PSYCHIATRIC DISORDERS  Neuromuscular disease    GI/Hepatic Neg liver ROS, GERD  Controlled,  Endo/Other  diabetes, Type 2  Renal/GU negative Renal ROS  negative genitourinary   Musculoskeletal   Abdominal   Peds  Hematology negative hematology ROS (+)   Anesthesia Other Findings Past Medical History: No date: Anxiety 01/21/2013: Aortic stenosis     Comment:  s/p bioprosthetic TAVR 10/2021 No date: Frequent sinus infections No date: Headache     Comment:  Tension headaches No date: History of Barrett's esophagus No date: Hyperchloremia 2018: MRSA (methicillin resistant staph aureus) culture positive     Comment:  history of, in hand No date: Palpitations No date: Seasonal allergies No date: Skin cancer     Comment:  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  Past Surgical History: 11/15/2020: ANTERIOR CERVICAL DECOMP/DISCECTOMY FUSION; N/A     Comment:  Procedure: ANTERIOR CERVICAL DECOMPRESSION/DISCECTOMY               FUSION 1 LEVEL C3/4;  Surgeon: Deetta Perla, MD;                Location: [facility];  Service: Neurosurgery;  Laterality:               N/A; 09/19/2021: AORTIC VALVE REPLACEMENT; N/A     Comment:  Procedure: AORTIC VALVE REPLACEMENT Using 57m Edwards               Resilia Inspiris  Valve;  Surgeon: BGaye Pollack MD;                [place] [facility]  Service: Open Heart Surgery;                Laterality: N/A; No date: AUGMENTATION MAMMAPLASTY; Bilateral     Comment:  breast implants 2007: Bone Spur     Comment:  foot No date: CARDIAC CATHETERIZATION 2008: Carpal Tunnel Symdrome     Comment:  as repeated in 2009 01/16/2019: CATARACT EXTRACTION W/PHACO; Left     Comment:  Procedure: CATARACT EXTRACTION PHACO AND INTRAOCULAR               LENS PLACEMENT ([place] LEFT DIABETES VISION BLUE;  Surgeon:              HMarchia Meiers MD;  Location: [facility];  Service:               Ophthalmology;  Laterality: Left;  [place] 00:47 CDE               6.62 Fluid pack lot # [id number] 02/06/2019: CATARACT EXTRACTION W/PHACO; Right     Comment:  Procedure: CATARACT EXTRACTION PHACO AND INTRAOCULAR               LENS PLACEMENT (IOC);  Surgeon: HMarchia Meiers MD;  Location: [facility];  Service: Ophthalmology;  Laterality:              Right;  [place] 00:54.6 CDE 6.52 FLUID PACK LOT # [id number] 2007: CHOLECYSTECTOMY 11/17/2015: COLONOSCOPY WITH PROPOFOL; N/A     Comment:  Procedure: COLONOSCOPY WITH PROPOFOL;  Surgeon: Manya Silvas, MD;  Location: [facility] ENDOSCOPY;  Service:               Endoscopy;  Laterality: N/A; 09/19/2021: CORONARY ARTERY BYPASS GRAFT; N/A     Comment:  Procedure: CORONARY ARTERY BYPASS GRAFTING X1 Using Left              Internal Mammory artery;  Surgeon: Gaye Pollack, MD;                Location: [place];  Service: Open Heart Surgery;                Laterality: N/A; 11/17/2015: ESOPHAGOGASTRODUODENOSCOPY (EGD) WITH PROPOFOL; N/A     Comment:  Procedure: ESOPHAGOGASTRODUODENOSCOPY (EGD) WITH               PROPOFOL;  Surgeon: Manya Silvas, MD;  Location: [facility]              ENDOSCOPY;  Service: Endoscopy;  Laterality: N/A; 05/24/2021: INTRAVASCULAR PRESSURE WIRE/FFR STUDY; N/A     Comment:  Procedure: INTRAVASCULAR PRESSURE WIRE/FFR  STUDY;                Surgeon: Yolonda Kida, MD;  Location: [place]              CV LAB;  Service: Cardiovascular;  Laterality: N/A; No date: MANDIBLE SURGERY 03/2013: NASAL SINUS SURGERY     Comment:  Multile surgery Dr. Carlis Abbott. DX eosinophilic sinusitis               04-2013 10/2010: Release of Trigger Finger; Right     Comment:  Dr. Tamala Julian 05/24/2021: RIGHT/LEFT HEART CATH AND CORONARY ANGIOGRAPHY; N/A     Comment:  Procedure: RIGHT/LEFT HEART CATH AND CORONARY               ANGIOGRAPHY;  Surgeon: Yolonda Kida, MD;  Location:              [place] CV LAB;  Service: Cardiovascular;                Laterality: N/A; 09/19/2021: TEE WITHOUT CARDIOVERSION; N/A     Comment:  Procedure: TRANSESOPHAGEAL ECHOCARDIOGRAM (TEE);                Surgeon: Gaye Pollack, MD;  Location: [place];  Service:              Open Heart Surgery;  Laterality: N/A; 1960: TONSILLECTOMY     Reproductive/Obstetrics negative OB ROS                             Anesthesia Physical Anesthesia Plan  ASA: 3  Anesthesia Plan: General   Post-op Pain Management:    Induction: Intravenous  PONV Risk Score and Plan: Propofol infusion and TIVA  Airway Management Planned: Natural Airway and Nasal Cannula  Additional Equipment:   Intra-op Plan:   Post-operative Plan:   Informed Consent: I have reviewed the patients History and Physical, chart, labs and discussed the procedure including the risks, benefits and alternatives  for the proposed anesthesia with the patient or authorized representative who has indicated his/her understanding and acceptance.     Dental Advisory Given  Plan Discussed with: Anesthesiologist, CRNA and Surgeon  Anesthesia Plan Comments: (Patient consented for risks of anesthesia including but not limited to:  - adverse reactions to medications - risk of airway placement if required - damage to eyes, teeth, lips or other oral mucosa -  nerve damage due to positioning  - sore throat or hoarseness - Damage to heart, brain, nerves, lungs, other parts of body or loss of life  Patient voiced understanding.)        Anesthesia Quick Evaluation

## 2022-04-12 NOTE — Progress Notes (Signed)
Patient arrived for scheduled Colonoscopy with Dr Vicente Males.  Unfortunately, she did not stop her Eliquis prior to the procedure and she will need to be rescheduled.  Patient is aware and the office will notify her a new date for her Colonoscopy and with instructions for stopping her Eliquis prior.

## 2022-04-13 DIAGNOSIS — D045 Carcinoma in situ of skin of trunk: Secondary | ICD-10-CM | POA: Diagnosis not present

## 2022-04-13 DIAGNOSIS — L658 Other specified nonscarring hair loss: Secondary | ICD-10-CM | POA: Diagnosis not present

## 2022-04-13 DIAGNOSIS — D485 Neoplasm of uncertain behavior of skin: Secondary | ICD-10-CM | POA: Diagnosis not present

## 2022-04-13 DIAGNOSIS — L650 Telogen effluvium: Secondary | ICD-10-CM | POA: Diagnosis not present

## 2022-04-14 NOTE — Telephone Encounter (Signed)
Left message to return call to our office.  

## 2022-04-18 DIAGNOSIS — F411 Generalized anxiety disorder: Secondary | ICD-10-CM | POA: Diagnosis not present

## 2022-04-18 DIAGNOSIS — F331 Major depressive disorder, recurrent, moderate: Secondary | ICD-10-CM | POA: Diagnosis not present

## 2022-04-19 ENCOUNTER — Encounter: Payer: Self-pay | Admitting: Family

## 2022-04-19 ENCOUNTER — Ambulatory Visit (INDEPENDENT_AMBULATORY_CARE_PROVIDER_SITE_OTHER): Payer: PPO | Admitting: Family

## 2022-04-19 VITALS — BP 134/60 | HR 97 | Temp 98.4°F | Resp 16 | Ht 60.0 in | Wt 137.0 lb

## 2022-04-19 DIAGNOSIS — T8543XA Leakage of breast prosthesis and implant, initial encounter: Secondary | ICD-10-CM

## 2022-04-19 DIAGNOSIS — N644 Mastodynia: Secondary | ICD-10-CM | POA: Diagnosis not present

## 2022-04-19 DIAGNOSIS — Z9882 Breast implant status: Secondary | ICD-10-CM

## 2022-04-19 MED ORDER — PREGABALIN 75 MG PO CAPS: 75.0000 mg | ORAL_CAPSULE | Freq: Three times a day (TID) | ORAL | Status: DC

## 2022-04-19 NOTE — Assessment & Plan Note (Signed)
Possible rupture of left breast implant vs mastitis.  Daughter recently dx with breast cancer Reviewed last mammogram 7/23  Ordered bil dx mammo and bil u/s breast STAT .  Pt to call and make appt.

## 2022-04-19 NOTE — Patient Instructions (Signed)
I have sent an electronic order over to your preferred location for the following:   Bilateral diagnostic breast mammogram and bilateral ultrasound of breasts  Please give this center a call to get scheduled at your convenience.  '[x]'$   [facility]  [place] [address]  [phone]  Make sure to wear two piece  clothing  No lotions powders or deodorants the day of the appointment Make sure to bring picture ID and insurance card.  Bring list of medications you are currently taking including any supplements.

## 2022-04-19 NOTE — Progress Notes (Signed)
Established Patient Office Visit  Subjective:  Patient ID: Katie Buckley, female    DOB: 06/27/1950  Age: 71 y.o. MRN: [id number]  CC:  Chief Complaint  Patient presents with   Breast Pain    Had a few falls left side really hurts X 1 week    HPI [name] is here today with concerns.  She has had a few falls on her left side, and thinks that she might have landed on her left breast. She does have implants in bil that have scar tissue to her knowledge. When she touches her left breast it is tender to the touch.    Mammogram 12/20/21: chronic rupture of implant, right breast. No suspicious findings.   Unstable gait known to neurologist: last seen 10/2 for workup of parkinson's which per pt was negative. Was advised to to reduce her lyrica to 75 mg TID. Per er chart lyrica last given at 25 mg twice a day. Double checked pdmp and given 75 mg total qty 90 for thirty day supply 10/19.   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    Past Surgical History:  Procedure Laterality Date   ANTERIOR CERVICAL DECOMP/DISCECTOMY FUSION N/A 11/15/2020   Procedure: ANTERIOR CERVICAL DECOMPRESSION/DISCECTOMY FUSION 1 LEVEL C3/4;  Surgeon: Deetta Perla, MD;  Location: [facility];  Service: Neurosurgery;  Laterality: N/A;   AORTIC VALVE REPLACEMENT N/A 09/19/2021   Procedure: AORTIC VALVE REPLACEMENT Using 29m Edwards Resilia Inspiris Valve;  Surgeon: BGaye Pollack MD;  Location: [facility]  Service: Open Heart Surgery;  Laterality: N/A;   AUGMENTATION MAMMAPLASTY Bilateral    breast implants   Bone Spur  2007   foot    CARDIAC CATHETERIZATION     Carpal Tunnel Symdrome  2008   as repeated in 2009   CATARACT EXTRACTION W/PHACO Left 01/16/2019   Procedure: CATARACT EXTRACTION PHACO AND INTRAOCULAR LENS PLACEMENT ([place] LEFT DIABETES VISION BLUE;  Surgeon: HMarchia Meiers MD;  Location: [facility];  Service: Ophthalmology;  Laterality: Left;  [place] 00:47 CDE 6.62 Fluid pack lot # [id number]   CATARACT EXTRACTION W/PHACO Right 02/06/2019   Procedure: CATARACT EXTRACTION PHACO AND INTRAOCULAR LENS PLACEMENT (IOC);  Surgeon: HMarchia Meiers MD;  Location: [facility];  Service: Ophthalmology;  Laterality: Right;  [place]:54.6 CDE 6.52 FLUID PACK LOT # [id number]   CHOLECYSTECTOMY  2007   COLONOSCOPY WITH PROPOFOL N/A 11/17/2015   Procedure: COLONOSCOPY WITH PROPOFOL;  Surgeon: RManya Silvas MD;  [facility]  Service: Endoscopy;  Laterality: N/A;   CORONARY ARTERY BYPASS GRAFT N/A 09/19/2021   Procedure: CORONARY ARTERY BYPASS GRAFTING X1 Using Left Internal Mammory artery;  Surgeon: BGaye Pollack MD;  [place] [facility]  Service: Open Heart Surgery;  Laterality: N/A;   ESOPHAGOGASTRODUODENOSCOPY (EGD) WITH PROPOFOL N/A 11/17/2015   Procedure: ESOPHAGOGASTRODUODENOSCOPY (EGD) WITH PROPOFOL;  Surgeon: RManya Silvas MD;  [facility]  Service: Endoscopy;  Laterality: N/A;   INTRAVASCULAR PRESSURE WIRE/FFR STUDY N/A 05/24/2021   Procedure: INTRAVASCULAR PRESSURE WIRE/FFR STUDY;  Surgeon: Yolonda Kida, MD;  Location: [place] CV LAB;  Service: Cardiovascular;  Laterality: N/A;   MANDIBLE SURGERY     NASAL SINUS SURGERY  03/2013   Multile surgery Dr. Carlis Abbott. DX eosinophilic sinusitis [id number]   Release of Trigger Finger Right 10/2010   Dr. Tamala Julian   RIGHT/LEFT HEART CATH AND CORONARY ANGIOGRAPHY N/A 05/24/2021   Procedure: RIGHT/LEFT HEART CATH AND CORONARY ANGIOGRAPHY;  Surgeon: Yolonda Kida, MD;  Location: [place] CV LAB;  Service: Cardiovascular;  Laterality: N/A;   TEE  WITHOUT CARDIOVERSION N/A 09/19/2021   Procedure: TRANSESOPHAGEAL ECHOCARDIOGRAM (TEE);  Surgeon: Gaye Pollack, MD;  Location: [place];  Service: Open Heart Surgery;  Laterality: N/A;   TONSILLECTOMY  1960    Family History  Problem Relation Age of Onset   Diabetes Father    Lung cancer Mother    Breast cancer Maternal Aunt    Irritable bowel syndrome Sister    Colon cancer Brother     Social History   Socioeconomic History   Marital status: Married    Spouse name: Katie Buckley, husband   Number of children: 1   Years of education: Not on file   Highest education level: Not on file  Occupational History    Comment: retired    Fish farm manager: RETIRED  Tobacco Use   Smoking status: Never   Smokeless tobacco: Never  Vaping Use   Vaping Use: Never used  Substance and Sexual Activity   Alcohol use: No   Drug use: No   Sexual activity: Not Currently    Partners: Male    Birth control/protection: Post-menopausal  Other Topics Concern   Not on file  Social History Narrative   Not on file   Social Determinants of Health   Financial Resource Strain: Low Risk  (01/31/2022)   Overall Financial Resource Strain (CARDIA)    Difficulty of Paying Living Expenses: Not hard at all  Food Insecurity: No Food Insecurity (01/31/2022)   Hunger Vital Sign    Worried About Running Out of Food in the Last Year: Never true    Center in the Last Year: Never true  Transportation Needs: No Transportation Needs (01/31/2022)   PRAPARE - Transportation    Lack of Transportation (Medical): No    Lack of Transportation (Non-Medical): No  Physical Activity: Inactive (01/31/2022)   Exercise Vital Sign    Days of Exercise per Week: 0 days    Minutes of Exercise per Session: 0 min  Stress: Stress Concern Present (01/31/2022)   [place]    Feeling of Stress : To some extent  Social Connections: Moderately Isolated (01/31/2022)   Social  Connection and Isolation Panel [NHANES]    Frequency of Communication with Friends and Family: Twice a week    Frequency of Social Gatherings with Friends and Family: Once a week    Attends Religious Services: Never    Marine scientist or Organizations: No    Attends Archivist Meetings: Never    Marital Status: Married  Human resources officer Violence: Not At Risk (01/31/2022)   Humiliation, Afraid, Rape, and Kick questionnaire    Fear of Current or Ex-Partner: No    Emotionally Abused: No    Physically Abused: No    Sexually Abused: No    Outpatient Medications Prior to Visit  Medication Sig Dispense Refill   acetaminophen (TYLENOL) 650 MG CR tablet Take 1,300 mg by mouth every 8 (  eight) hours as needed for pain (pain).     atorvastatin (LIPITOR) 80 MG tablet      buPROPion (WELLBUTRIN SR) 150 MG 12 hr tablet Take 150 mg by mouth 2 (two) times daily.     butalbital-acetaminophen-caffeine (FIORICET) 50-325-40 MG tablet TAKE 1 TABLET BY MOUTH EVERY 6 HOURS AS NEEDED FOR HEADACHE (Patient taking differently: Take 1 tablet by mouth every 6 (six) hours as needed for headache.) 30 tablet 4   ezetimibe (ZETIA) 10 MG tablet TAKE 1 TABLET BY MOUTH EVERYDAY AT BEDTIME (Patient taking differently: Take 10 mg by mouth at bedtime.) 90 tablet 0   fexofenadine (ALLEGRA) 180 MG tablet Take 180 mg by mouth daily as needed for allergies or rhinitis.     glipiZIDE (GLUCOTROL) 5 MG tablet Take 1 tablet (5 mg total) by mouth daily. (Patient taking differently: Take 5 mg by mouth daily. 0.5 tablet) 30 tablet 2   hydrOXYzine (VISTARIL) 25 MG capsule Take 25 mg by mouth daily as needed for anxiety.     irbesartan (AVAPRO) 75 MG tablet TAKE 1 TABLET BY MOUTH EVERY DAY 90 tablet 4   Lancet Devices MISC      loperamide (IMODIUM) 2 MG capsule Take 2-4 mg by mouth daily as needed for diarrhea or loose stools.     metFORMIN (GLUCOPHAGE-XR) 500 MG 24 hr tablet TAKE 2 TABLETS BY MOUTH EVERY DAY 180 tablet 3    metoprolol tartrate (LOPRESSOR) 50 MG tablet Take 1 tablet (50 mg total) by mouth 2 (two) times daily. 60 tablet 2   montelukast (SINGULAIR) 10 MG tablet Take 1 tablet (10 mg total) by mouth at bedtime as needed (allergies). Home med.     Olopatadine HCl 0.7 % SOLN Place 1 drop into both eyes daily as needed (allergies).     ondansetron (ZOFRAN ODT) 4 MG disintegrating tablet Take 1 tablet (4 mg total) by mouth every 8 (eight) hours as needed for nausea or vomiting. 20 tablet 3   promethazine (PHENERGAN) 25 MG tablet Take 25 mg by mouth every 8 (eight) hours as needed.     RABEprazole (ACIPHEX) 20 MG tablet TAKE 1 TABLET BY MOUTH EVERY DAY 90 tablet 4   sertraline (ZOLOFT) 100 MG tablet Take 2 tablets (200 mg total) by mouth daily.     simethicone (MYLICON) 80 MG chewable tablet Chew 80 mg by mouth every 6 (six) hours as needed for flatulence.     LYRICA 25 MG capsule Take 25 mg by mouth 2 (two) times daily.     apixaban (ELIQUIS) 5 MG TABS tablet Take 1 tablet (5 mg total) by mouth 2 (two) times daily. 60 tablet 2   No facility-administered medications prior to visit.    Allergies  Allergen Reactions   Doxycycline Other (See Comments)    Emotional changes/anxious   Biaxin [Clarithromycin]     GI upset, and bad taste in mouth.        Objective:    Physical Exam Constitutional:      Appearance: Normal appearance.  Cardiovascular:     Rate and Rhythm: Normal rate and regular rhythm.  Pulmonary:     Effort: Pulmonary effort is normal.  Chest:     Chest wall: Tenderness present.       Comments: Mild right breast tenderness generalized.  Neurological:     Mental Status: She is alert.     BP 134/60   Pulse 97   Temp 98.4 F (36.9 C)   Resp 16  Ht 5' (1.524 m)   Wt 137 lb (62.1 kg)   SpO2 (!) 63%   BMI 26.76 kg/m  Wt Readings from Last 3 Encounters:  04/19/22 137 lb (62.1 kg)  03/31/22 131 lb (59.4 kg)  03/23/22 136 lb (61.7 kg)     Health Maintenance Due   Topic Date Due   FOOT EXAM  Never done   Zoster Vaccines- Shingrix (1 of 2) Never done   DEXA SCAN  Never done   TETANUS/TDAP  10/08/2017   COVID-19 Vaccine (3 - Pfizer risk series) 09/24/2019   COLONOSCOPY (Pts 45-21yr Insurance coverage will need to be confirmed)  11/16/2020   OPHTHALMOLOGY EXAM  06/30/2021    There are no preventive care reminders to display for this patient.  Lab Results  Component Value Date   TSH 1.51 03/08/2022   Lab Results  Component Value Date   WBC 6.9 03/23/2022   HGB 10.0 (L) 03/23/2022   HCT 31.7 (L) 03/23/2022   MCV 81.3 03/23/2022   PLT 163 03/23/2022   Lab Results  Component Value Date   NA 140 03/23/2022   K 4.4 03/23/2022   CO2 26 03/23/2022   GLUCOSE 140 (H) 03/23/2022   BUN 13 03/23/2022   CREATININE 0.59 03/23/2022   BILITOT 0.7 03/23/2022   ALKPHOS 83 03/23/2022   AST 62 (H) 03/23/2022   ALT 39 03/23/2022   PROT 6.4 (L) 03/23/2022   ALBUMIN 3.8 03/23/2022   CALCIUM 9.6 03/23/2022   ANIONGAP 8 03/23/2022   EGFR 75 02/09/2021   GFR 86.05 03/08/2022   Lab Results  Component Value Date   HGBA1C 6.1 03/08/2022      Assessment & Plan:   Problem List Items Addressed This Visit       Other   Breast pain, left - Primary    Possible rupture of left breast implant vs mastitis.  Daughter recently dx with breast cancer Reviewed last mammogram 7/23  Ordered bil dx mammo and bil u/s breast STAT .  Pt to call and make appt.      Relevant Orders   [place]AST LTD UNI LEFT INC AXILLA   [place] LTD UNI RIGHT INC AXILLA   MM DIAG BREAST TOMO BILATERAL   Other Visit Diagnoses     Pain of right breast       Relevant Orders   [place] LTD UNI LEFT INC AXILLA   [place] LTD UNI RIGHT INC AXILLA   MM DIAG BREAST TOMO BILATERAL   Presence of bilateral breast implant       Relevant Orders   [place]AST LTD UNI LEFT INC AXILLA   [place] LTD UNI RIGHT INC AXILLA   MM DIAG BREAST TOMO BILATERAL   Rupture of implant of  right breast, initial encounter       Relevant Orders   [place]AST LTD UNI LEFT INC AXILLA   [place]AST LTD UNI RIGHT INC AXILLA   MM DIAG BREAST TOMO BILATERAL       Meds ordered this encounter  Medications   pregabalin (LYRICA) 75 MG capsule    Sig: Take 1 capsule (75 mg total) by mouth 3 (three) times daily.    Order Specific Question:   Supervising Provider    Answer:   BEliezer LoftsE [[[id number]]   Follow-up: Return in about 2 weeks (around 05/03/2022) for f/u breast pain .    TEugenia Pancoast FNP

## 2022-04-20 ENCOUNTER — Ambulatory Visit: Payer: PPO

## 2022-04-21 ENCOUNTER — Inpatient Hospital Stay: Payer: PPO | Attending: Internal Medicine

## 2022-04-21 ENCOUNTER — Other Ambulatory Visit: Payer: Self-pay | Admitting: *Deleted

## 2022-04-21 DIAGNOSIS — Z79899 Other long term (current) drug therapy: Secondary | ICD-10-CM | POA: Diagnosis not present

## 2022-04-21 DIAGNOSIS — Z85828 Personal history of other malignant neoplasm of skin: Secondary | ICD-10-CM | POA: Insufficient documentation

## 2022-04-21 DIAGNOSIS — D509 Iron deficiency anemia, unspecified: Secondary | ICD-10-CM | POA: Insufficient documentation

## 2022-04-21 DIAGNOSIS — F331 Major depressive disorder, recurrent, moderate: Secondary | ICD-10-CM | POA: Diagnosis not present

## 2022-04-21 DIAGNOSIS — D508 Other iron deficiency anemias: Secondary | ICD-10-CM

## 2022-04-21 DIAGNOSIS — F411 Generalized anxiety disorder: Secondary | ICD-10-CM | POA: Diagnosis not present

## 2022-04-21 DIAGNOSIS — Z801 Family history of malignant neoplasm of trachea, bronchus and lung: Secondary | ICD-10-CM | POA: Diagnosis not present

## 2022-04-21 DIAGNOSIS — I6529 Occlusion and stenosis of unspecified carotid artery: Secondary | ICD-10-CM | POA: Insufficient documentation

## 2022-04-21 DIAGNOSIS — Z953 Presence of xenogenic heart valve: Secondary | ICD-10-CM | POA: Diagnosis not present

## 2022-04-21 DIAGNOSIS — Z800 Family history of malignant neoplasm of digestive organs: Secondary | ICD-10-CM | POA: Diagnosis not present

## 2022-04-21 DIAGNOSIS — R3915 Urgency of urination: Secondary | ICD-10-CM | POA: Insufficient documentation

## 2022-04-21 DIAGNOSIS — I350 Nonrheumatic aortic (valve) stenosis: Secondary | ICD-10-CM | POA: Insufficient documentation

## 2022-04-21 DIAGNOSIS — K2270 Barrett's esophagus without dysplasia: Secondary | ICD-10-CM | POA: Insufficient documentation

## 2022-04-21 DIAGNOSIS — Z7984 Long term (current) use of oral hypoglycemic drugs: Secondary | ICD-10-CM | POA: Diagnosis not present

## 2022-04-21 DIAGNOSIS — Z7901 Long term (current) use of anticoagulants: Secondary | ICD-10-CM | POA: Diagnosis not present

## 2022-04-21 DIAGNOSIS — G4700 Insomnia, unspecified: Secondary | ICD-10-CM | POA: Diagnosis not present

## 2022-04-21 DIAGNOSIS — Z803 Family history of malignant neoplasm of breast: Secondary | ICD-10-CM | POA: Insufficient documentation

## 2022-04-21 DIAGNOSIS — R41840 Attention and concentration deficit: Secondary | ICD-10-CM | POA: Diagnosis not present

## 2022-04-21 LAB — CBC WITH DIFFERENTIAL/PLATELET
Abs Immature Granulocytes: 0.01 10*3/uL (ref 0.00–0.07)
Basophils Absolute: 0.1 10*3/uL (ref 0.0–0.1)
Basophils Relative: 1 %
Eosinophils Absolute: 0.3 10*3/uL (ref 0.0–0.5)
Eosinophils Relative: 5 %
HCT: 40.3 % (ref 36.0–46.0)
Hemoglobin: 12.9 g/dL (ref 12.0–15.0)
Immature Granulocytes: 0 %
Lymphocytes Relative: 27 %
Lymphs Abs: 1.6 10*3/uL (ref 0.7–4.0)
MCH: 26.5 pg (ref 26.0–34.0)
MCHC: 32 g/dL (ref 30.0–36.0)
MCV: 82.8 fL (ref 80.0–100.0)
Monocytes Absolute: 0.4 10*3/uL (ref 0.1–1.0)
Monocytes Relative: 7 %
Neutro Abs: 3.6 10*3/uL (ref 1.7–7.7)
Neutrophils Relative %: 60 %
Platelets: 133 10*3/uL — ABNORMAL LOW (ref 150–400)
RBC: 4.87 MIL/uL (ref 3.87–5.11)
RDW: 17.2 % — ABNORMAL HIGH (ref 11.5–15.5)
WBC: 5.9 10*3/uL (ref 4.0–10.5)
nRBC: 0 % (ref 0.0–0.2)

## 2022-04-21 LAB — COMPREHENSIVE METABOLIC PANEL
ALT: 43 U/L (ref 0–44)
AST: 44 U/L — ABNORMAL HIGH (ref 15–41)
Albumin: 4 g/dL (ref 3.5–5.0)
Alkaline Phosphatase: 84 U/L (ref 38–126)
Anion gap: 8 (ref 5–15)
BUN: 13 mg/dL (ref 8–23)
CO2: 26 mmol/L (ref 22–32)
Calcium: 9.3 mg/dL (ref 8.9–10.3)
Chloride: 106 mmol/L (ref 98–111)
Creatinine, Ser: 0.58 mg/dL (ref 0.44–1.00)
GFR, Estimated: >60 mL/min (ref >60)
Glucose, Bld: 144 mg/dL — ABNORMAL HIGH (ref 70–99)
Potassium: 4.1 mmol/L (ref 3.5–5.1)
Sodium: 140 mmol/L (ref 135–145)
Total Bilirubin: 0.3 mg/dL (ref 0.3–1.2)
Total Protein: 6.6 g/dL (ref 6.5–8.1)

## 2022-04-21 LAB — IRON AND TIBC
Iron: 109 ug/dL (ref 28–170)
Saturation Ratios: 37 % — ABNORMAL HIGH (ref 10.4–31.8)
TIBC: 298 ug/dL (ref 250–450)
UIBC: 189 ug/dL

## 2022-04-21 LAB — FERRITIN: Ferritin: 404 ng/mL — ABNORMAL HIGH (ref 11–307)

## 2022-04-25 ENCOUNTER — Encounter: Payer: Self-pay | Admitting: Internal Medicine

## 2022-04-25 ENCOUNTER — Inpatient Hospital Stay (HOSPITAL_BASED_OUTPATIENT_CLINIC_OR_DEPARTMENT_OTHER): Payer: PPO | Admitting: Internal Medicine

## 2022-04-25 VITALS — BP 156/56 | HR 55 | Temp 96.8°F | Resp 20 | Wt 137.6 lb

## 2022-04-25 DIAGNOSIS — E538 Deficiency of other specified B group vitamins: Secondary | ICD-10-CM

## 2022-04-25 DIAGNOSIS — D509 Iron deficiency anemia, unspecified: Secondary | ICD-10-CM

## 2022-04-25 NOTE — Progress Notes (Signed)
[place]  Telephone:(336) [phone] [phone]) [phone]  ID: [name] OB: [birth year]  MR#: [id number]  CSN#:[id number]  Patient Care Team: Eugenia Pancoast, [place] as PCP - General (Family Medicine) Chauncey Mann, MD as Referring Physician (Psychiatry) Vladimir Crofts, MD as Consulting Physician (Neurology) Germaine Pomfret, [facility] (Pharmacist) Yolonda Kida, MD as Consulting Physician (Cardiology)  REFERRING PROVIDER: Eugenia Pancoast, FNP  REASON FOR REFERRAL: Worsening anemia  HPI: [name] is a 71 y.o. female with past medical history of aortic stenosis status post bioprosthetic valve in May 2023, Barrett's esophagus, anxiety, and skin cancer was referred to hematology for further work-up of worsening anemia.  Patient reports feeling tired and low motivation to do her daily activities.  She has mild shortness of breath at rest and on exertion. She reports constipation and when she takes stool softener she gets diarrhea. Denies any bleeding in stool, urine, nose or gum bleeding. Endoscopy done in June 2017 by Dr. Vira Agar showed esophageal mucosal changes secondary to established short segment Barrett's esophagus, erythema and mild inflammation in the gastric antrum.  H. pylori was negative.  Colonoscopy showed polyps in descending, transverse colon and cecum..  Pathology showed hyperplastic polyp in the transverse colon.  Tubular adenoma in descending colon.  Labs reviewed.  Hemoglobin slowly trending down with most recent 8.7 from 03/08/2022.  WBC 7.  Platelets 162.  MCV 78. Vitamin B12 and folate normal.  TSH normal.  Iron studies ferritin 16, saturation 6%  When patient presented post Feraheme she complained about neck swelling and pain.  She was started on Augmentin and CT neck was ordered. CT neck with contrast done on 03/30/2022 showed questionable small but mildly inflamed nodes level 1 level 2 lymph node.  No underlying lymphadenopathy, neck masses  or other inflammation.  INTERVAL HISTORY-  Patient was seen today to discuss labs. The swelling in her neck has resolved.  Responded well to Augmentin. Reports urgency with urination since she had heart surgery in April 2023.  Somewhat worse now.  She is going to start cardiac rehab soon.   REVIEW OF SYSTEMS:   Review of Systems  Constitutional:  Negative for chills, fever and malaise/fatigue.  Respiratory:  Negative for cough, hemoptysis and shortness of breath.   Gastrointestinal:  Negative for abdominal pain, blood in stool, constipation, nausea and vomiting.  Genitourinary:  Positive for urgency.  Musculoskeletal: Negative.   Neurological:  Negative for dizziness.    As per HPI. Otherwise, a complete review of systems is negative.  PAST MEDICAL HISTORY: 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    PAST SURGICAL HISTORY: Past Surgical History:  Procedure Laterality Date   ANTERIOR CERVICAL DECOMP/DISCECTOMY FUSION N/A 11/15/2020   Procedure: ANTERIOR CERVICAL DECOMPRESSION/DISCECTOMY FUSION 1 LEVEL C3/4;  Surgeon: Deetta Perla, MD;  Location: [facility];  Service: Neurosurgery;  Laterality: N/A;   AORTIC VALVE REPLACEMENT N/A 09/19/2021   Procedure: AORTIC VALVE REPLACEMENT Using 69m Edwards Resilia Inspiris Valve;  Surgeon: BGaye Pollack MD;  Location: [facility]  Service: Open Heart Surgery;  Laterality: N/A;   AUGMENTATION MAMMAPLASTY Bilateral    breast implants   Bone Spur  2007   foot   CARDIAC CATHETERIZATION     Carpal Tunnel Symdrome  2008   as repeated in 2009   CATARACT EXTRACTION W/PHACO Left  01/16/2019   Procedure: CATARACT EXTRACTION PHACO AND INTRAOCULAR LENS PLACEMENT ([place]) LEFT DIABETES VISION BLUE;  Surgeon: Marchia Meiers, MD;  Location: [facility];  Service: Ophthalmology;  Laterality: Left;  [place]  00:47 CDE 6.62 Fluid pack lot # [id number]   CATARACT EXTRACTION W/PHACO Right 02/06/2019   Procedure: CATARACT EXTRACTION PHACO AND INTRAOCULAR LENS PLACEMENT (I[facility]);  Surgeon: Marchia Meiers, MD;  Location: [facility];  Service: Ophthalmology;  Laterality: Right;  [place] 00:54.6 CDE 6.52 FLUID PACK LOT # [id number]   CHOLECYSTECTOMY  2007   COLONOSCOPY WITH PROPOFOL N/A 11/17/2015   Procedure: COLONOSCOPY WITH PROPOFOL;  Surgeon: Manya Silvas, MD;  Location: [facility];  Service: Endoscopy;  Laterality: N/A;   CORONARY ARTERY BYPASS GRAFT N/A 09/19/2021   Procedure: CORONARY ARTERY BYPASS GRAFTING X1 Using Left Internal Mammory artery;  Surgeon: Gaye Pollack, MD;  Location: [place];  Service: Open Heart Surgery;  Laterality: N/A;   ESOPHAGOGASTRODUODENOSCOPY (EGD) WITH PROPOFOL N/A 11/17/2015   Procedure: ESOPHAGOGASTRODUODENOSCOPY (EGD) WITH PROPOFOL;  Surgeon: Manya Silvas, MD;  Location[facility];  Service: Endoscopy;  Laterality: N/A;   INTRAVASCULAR PRESSURE WIRE/FFR STUDY N/A 05/24/2021   Procedure: INTRAVASCULAR PRESSURE WIRE/FFR STUDY;  Surgeon: Yolonda Kida, MD;  Location: [facility];  Service: Cardiovascular;  Laterality: N/A;   MANDIBLE SURGERY     NASAL SINUS SURGERY  03/2013   Multile surgery Dr. Carlis Abbott. 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 Tamala Julian   RIGHT/LEFT HEART CATH AND CORONARY ANGIOGRAPHY N/A 05/24/2021   Procedure: RIGHT/LEFT HEART CATH AND CORONARY ANGIOGRAPHY;  Surgeon: Yolonda Kida, MD;  Location: [facility];  Service: Cardiovascular;  Laterality: N/A;   TEE WITHOUT CARDIOVERSION N/A 09/19/2021   Procedure: TRANSESOPHAGEAL ECHOCARDIOGRAM (TEE);  Surgeon: Gaye Pollack, MD;   Location: [place] [facility];  Service: Open Heart Surgery;  Laterality: N/A;   TONSILLECTOMY  1960    FAMILY HISTORY: Family History  Problem Relation Age of Onset   Diabetes Father    Lung cancer Mother    Breast cancer Maternal Aunt    Irritable bowel syndrome Sister    Colon cancer Brother     HEALTH MAINTENANCE: Social History   Tobacco Use   Smoking status: Never   Smokeless tobacco: Never  Vaping Use   Vaping Use: Never used  Substance Use Topics   Alcohol use: No   Drug use: No     Allergies  Allergen Reactions   Doxycycline Other (See Comments)    Emotional changes/anxious   Biaxin [Clarithromycin]     GI upset, and bad taste in mouth.    Current Outpatient Medications  Medication Sig Dispense Refill   acetaminophen (TYLENOL) 650 MG CR tablet Take 1,300 mg by mouth every 8 (eight) hours as needed for pain (pain).     apixaban (ELIQUIS) 5 MG TABS tablet Take 1 tablet (5 mg total) by mouth 2 (two) times daily. 60 tablet 2   atorvastatin (LIPITOR) 80 MG tablet      buPROPion (WELLBUTRIN SR) 150 MG 12 hr tablet Take 150 mg by mouth 2 (two) times daily.     butalbital-acetaminophen-caffeine (FIORICET) 50-325-40 MG  tablet TAKE 1 TABLET BY MOUTH EVERY 6 HOURS AS NEEDED FOR HEADACHE (Patient taking differently: Take 1 tablet by mouth every 6 (six) hours as needed for headache.) 30 tablet 4   ezetimibe (ZETIA) 10 MG tablet TAKE 1 TABLET BY MOUTH EVERYDAY AT BEDTIME (Patient taking differently: Take 10 mg by mouth at bedtime.) 90 tablet 0   fexofenadine (ALLEGRA) 180 MG tablet Take 180 mg by mouth daily as needed for allergies or rhinitis.     glipiZIDE (GLUCOTROL) 5 MG tablet Take 1 tablet (5 mg total) by mouth daily. (Patient taking differently: Take 5 mg by mouth daily. 0.5 tablet) 30 tablet 2   hydrOXYzine (VISTARIL) 25 MG capsule Take 25 mg by mouth daily as needed for anxiety.     irbesartan (AVAPRO) 75 MG tablet TAKE 1 TABLET BY MOUTH EVERY DAY 90 tablet 4   Lancet Devices  MISC      loperamide (IMODIUM) 2 MG capsule Take 2-4 mg by mouth daily as needed for diarrhea or loose stools.     metFORMIN (GLUCOPHAGE-XR) 500 MG 24 hr tablet TAKE 2 TABLETS BY MOUTH EVERY DAY 180 tablet 3   metoprolol tartrate (LOPRESSOR) 50 MG tablet Take 1 tablet (50 mg total) by mouth 2 (two) times daily. 60 tablet 2   montelukast (SINGULAIR) 10 MG tablet Take 1 tablet (10 mg total) by mouth at bedtime as needed (allergies). Home med.     Olopatadine HCl 0.7 % SOLN Place 1 drop into both eyes daily as needed (allergies).     ondansetron (ZOFRAN ODT) 4 MG disintegrating tablet Take 1 tablet (4 mg total) by mouth every 8 (eight) hours as needed for nausea or vomiting. 20 tablet 3   pregabalin (LYRICA) 75 MG capsule Take 1 capsule (75 mg total) by mouth 3 (three) times daily.     promethazine (PHENERGAN) 25 MG tablet Take 25 mg by mouth every 8 (eight) hours as needed.     RABEprazole (ACIPHEX) 20 MG tablet TAKE 1 TABLET BY MOUTH EVERY DAY 90 tablet 4   sertraline (ZOLOFT) 100 MG tablet Take 2 tablets (200 mg total) by mouth daily.     simethicone (MYLICON) 80 MG chewable tablet Chew 80 mg by mouth every 6 (six) hours as needed for flatulence.     No current facility-administered medications for this visit.    OBJECTIVE: Vitals:   04/25/22 1055  BP: (!) 156/56  Pulse: (!) 55  Resp: 20  Temp: (!) 96.8 F (36 C)  SpO2: 100%      Body mass index is 26.87 kg/m.      General: Well-developed, well-nourished, no acute distress. Eyes: Pink conjunctiva, anicteric sclera. HEENT: Oropharynx clear Lungs: Clear to auscultation bilaterally. Heart: Regular rate and rhythm. No rubs, murmurs, or gallops. Abdomen: Soft, nontender, nondistended. No organomegaly noted, normoactive bowel sounds. Musculoskeletal: No edema, cyanosis, or clubbing. Neuro: Alert, answering all questions appropriately. Cranial nerves grossly intact. Skin: No rashes or petechiae noted. Psych: Normal affect. Lymphatics:  Right-sided neck swelling has improved.  Continues to have palpable about 2 to 3 cm right cervical lymph node.  Tenderness improved.   LAB RESULTS:  Lab Results  Component Value Date   NA 140 04/21/2022   K 4.1 04/21/2022   CL 106 04/21/2022   CO2 26 04/21/2022   GLUCOSE 144 (H) 04/21/2022   BUN 13 04/21/2022   CREATININE 0.58 04/21/2022   CALCIUM 9.3 04/21/2022   PROT 6.6 04/21/2022   ALBUMIN 4.0 04/21/2022   AST  44 (H) 04/21/2022   ALT 43 04/21/2022   ALKPHOS 84 04/21/2022   BILITOT 0.3 04/21/2022   GFRNONAA >60 04/21/2022   GFRAA 89 11/19/2019    Lab Results  Component Value Date   WBC 5.9 04/21/2022   NEUTROABS 3.6 04/21/2022   HGB 12.9 04/21/2022   HCT 40.3 04/21/2022   MCV 82.8 04/21/2022   PLT 133 (L) 04/21/2022    Lab Results  Component Value Date   TIBC 298 04/21/2022   TIBC 463 (H) 03/13/2022   FERRITIN 404 (H) 04/21/2022   FERRITIN 16 03/13/2022   IRONPCTSAT 37 (H) 04/21/2022   IRONPCTSAT 6 (L) 03/13/2022     STUDIES: CT SOFT TISSUE NECK W CONTRAST  Result Date: 03/31/2022 CLINICAL DATA:  71 year old female with right side neck pain and swelling for several weeks, "Getting better". History of cervical spine fusion. EXAM: CT NECK WITH CONTRAST TECHNIQUE: Multidetector CT imaging of the neck was performed using the standard protocol following the bolus administration of intravenous contrast. RADIATION DOSE REDUCTION: This exam was performed according to the departmental dose-optimization program which includes automated exposure control, adjustment of the mA and/or kV according to patient size and/or use of iterative reconstruction technique. CONTRAST:  61m OMNIPAQUE IOHEXOL 300 MG/ML  SOLN COMPARISON:  Postoperative cervical spine radiographs 11/15/2020. Preoperative cervical spine MRI 04/08/2020. FINDINGS: Pharynx and larynx: Partially retropharyngeal course of the right ICA (series 2, image 29, normal variant). Otherwise normal retropharyngeal space.  Mild motion artifact. Laryngeal and pharyngeal soft tissue contours are within normal limits. Normal parapharyngeal spaces. Salivary glands: Dental and mandible streak artifact but the sublingual space appears to remain normal. Submandibular and parotid glands are symmetric and within normal limits. Thyroid: Negative; subcentimeter left thyroid lobe hypodense nodule Not clinically significant; no follow-up imaging recommended (ref: J Am Coll Radiol. 2015 Feb;12(2): 143-50). Lymph nodes: Marked area of clinical concern along the right lateral neck series 2, image 46 and coronal image 64 at the level of the hyoid bone. This is overlying the right external jugular vein. Small regional right level 1 and/or level 2 lymph nodes are up to 5-6 mm short axis, normal by size criteria, and fairly symmetric (4-5 mm short axis on the left series 2, image 45. however, there is questionable mild adjacent soft tissue inflammation at the node on series 2 image 41, 43 deep to the platysma. But this is subtle and could be artifact due to motion. No other regional inflammation. No cervical lymphadenopathy. Vascular: Major vascular structures in the neck and at the skull base are patent including the right IJ. Carotid tortuosity with the partially retropharyngeal course on the right. Mild for age cervical carotid atherosclerosis. Limited intracranial: Negative. Visualized orbits: Postoperative changes to both globes. Mastoids and visualized paranasal sinuses: Previous bilateral paranasal sinus surgery. No significant residual sinus fluid level or mucosal thickening. Tympanic cavities and mastoids appear well aerated. Skeleton: Previous bilateral mandible ORIF. Previous C3-C4 cervical ACDF. 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 Previous sternotomy. No acute osseous abnormality identified. Upper chest: Calcified aortic atherosclerosis. Mild respiratory  motion, otherwise negative lung apices. No superior mediastinal lymphadenopathy. Visible axillary lymph nodes are normal. IMPRESSION: 1. Questionable small but mildly inflamed right level 1/level 2 lymph node at the right lateral neck marked area of clinical concern (series 2, image 43). But no underlying lymphadenopathy, neck mass, or other inflammation. Recommend follow-up by clinical exam, with repeat Neck CT (IV contrast preferred) if the  area further enlarges or becomes painful. 2. 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 3. Aortic Atherosclerosis (ICD10-I70.0). Electronically Signed   By: Genevie Ann M.D.   On: 03/31/2022 12:34    ASSESSMENT AND PLAN:   Katie Buckley is a 71 y.o. female with pmh of aortic stenosis status post bioprosthetic valve in May 2023, Barrett's esophagus, anxiety, and skin cancer was referred to hematology for further work-up of worsening anemia.  #Iron deficiency anemia -Completed 2 doses of IV Feraheme in October 2023.  Repeat iron panel showed good response to the treatment.  -Patient denies any bleeding. Endoscopy done in June 2017 by Dr. Vira Agar showed esophageal mucosal changes secondary to established short segment Barrett's esophagus, erythema and mild inflammation in the gastric antrum.  H. pylori was negative.  Colonoscopy showed polyps in descending, transverse colon and cecum..  Pathology showed hyperplastic polyp in the transverse colon.  Tubular adenoma in descending colon.   -She is scheduled for colonoscopy this week.  #Urinary urgency -Ongoing since April 2023 when she had heart surgery. -Advised to discuss with PCP about urology referral.  RTC in 5 months for MD visit, labs  Patient expressed understanding and was in agreement with this plan. She also understands that She can call clinic at any time with any questions, concerns, or complaints.   I spent a total of 25 minutes reviewing chart data,  face-to-face evaluation with the patient, counseling and coordination of care as detailed above.  Jane Canary, MD   04/25/2022 1:06 PM

## 2022-04-29 ENCOUNTER — Other Ambulatory Visit: Payer: Self-pay | Admitting: Family Medicine

## 2022-04-29 ENCOUNTER — Other Ambulatory Visit: Payer: Self-pay | Admitting: Physician Assistant

## 2022-05-03 DIAGNOSIS — G4700 Insomnia, unspecified: Secondary | ICD-10-CM | POA: Diagnosis not present

## 2022-05-03 DIAGNOSIS — F411 Generalized anxiety disorder: Secondary | ICD-10-CM | POA: Diagnosis not present

## 2022-05-03 DIAGNOSIS — F331 Major depressive disorder, recurrent, moderate: Secondary | ICD-10-CM | POA: Diagnosis not present

## 2022-05-03 DIAGNOSIS — R41840 Attention and concentration deficit: Secondary | ICD-10-CM | POA: Diagnosis not present

## 2022-05-08 ENCOUNTER — Ambulatory Visit: Payer: PPO | Admitting: Family

## 2022-05-08 ENCOUNTER — Ambulatory Visit
Admission: RE | Admit: 2022-05-08 | Discharge: 2022-05-08 | Disposition: A | Discharge status: Home / Self Care | Payer: PPO | Source: Ambulatory Visit | Attending: Family | Admitting: Family

## 2022-05-08 DIAGNOSIS — T8543XA Leakage of breast prosthesis and implant, initial encounter: Secondary | ICD-10-CM

## 2022-05-08 DIAGNOSIS — Z9882 Breast implant status: Secondary | ICD-10-CM

## 2022-05-08 DIAGNOSIS — N644 Mastodynia: Secondary | ICD-10-CM

## 2022-05-09 NOTE — Progress Notes (Signed)
Mammogram with no suspicious findings. If ongoing shoulder pain we can consider shoulder xray, and or ortho consult if necessary.   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 Left breast implant appears intact. Let me know if pt wants plastic surgeon consult to possibly have them removed.

## 2022-05-10 ENCOUNTER — Telehealth: Payer: Self-pay | Admitting: *Deleted

## 2022-05-10 ENCOUNTER — Ambulatory Visit: Payer: PPO

## 2022-05-10 NOTE — Telephone Encounter (Signed)
Pt called to cancel orientation again. She has missed three scheduled orienations for various reasons. We are not able to schedule her again at this time.

## 2022-05-18 DIAGNOSIS — D045 Carcinoma in situ of skin of trunk: Secondary | ICD-10-CM | POA: Diagnosis not present

## 2022-05-22 DIAGNOSIS — F331 Major depressive disorder, recurrent, moderate: Secondary | ICD-10-CM | POA: Diagnosis not present

## 2022-05-22 DIAGNOSIS — M79602 Pain in left arm: Secondary | ICD-10-CM | POA: Diagnosis not present

## 2022-05-22 DIAGNOSIS — F411 Generalized anxiety disorder: Secondary | ICD-10-CM | POA: Diagnosis not present

## 2022-05-22 DIAGNOSIS — M25512 Pain in left shoulder: Secondary | ICD-10-CM | POA: Diagnosis not present

## 2022-05-23 ENCOUNTER — Telehealth: Payer: Self-pay | Admitting: Family

## 2022-05-23 DIAGNOSIS — T8543XS Leakage of breast prosthesis and implant, sequela: Secondary | ICD-10-CM

## 2022-05-23 NOTE — Telephone Encounter (Signed)
Called pt and she said that she had seen ortho this week and they told her that she has calcium buildup. Also pt would like a referral to plastic surgeon.

## 2022-05-23 NOTE — Telephone Encounter (Signed)
We need some additional information regarding this "calcium buildup".  We also need to understand why she is wanting to be referred to a Psychiatric nurse.  It may be best if she come in for an office visit to discuss.

## 2022-05-23 NOTE — Telephone Encounter (Signed)
Patient called to get her lab results, she stated she gotten a letter in the mail about calling the office to discuss.

## 2022-05-25 NOTE — Telephone Encounter (Signed)
Left message to return call to our office.  

## 2022-05-28 ENCOUNTER — Emergency Department: Payer: PPO

## 2022-05-28 ENCOUNTER — Emergency Department
Admission: EM | Admit: 2022-05-28 | Discharge: 2022-05-28 | Discharge status: Against Medical Advice | Payer: PPO | Attending: Family Medicine | Admitting: Family Medicine

## 2022-05-28 ENCOUNTER — Other Ambulatory Visit: Payer: Self-pay

## 2022-05-28 DIAGNOSIS — R519 Headache, unspecified: Secondary | ICD-10-CM | POA: Diagnosis not present

## 2022-05-28 DIAGNOSIS — Z5321 Procedure and treatment not carried out due to patient leaving prior to being seen by health care provider: Secondary | ICD-10-CM | POA: Diagnosis not present

## 2022-05-28 DIAGNOSIS — T1490XA Injury, unspecified, initial encounter: Secondary | ICD-10-CM | POA: Diagnosis not present

## 2022-05-28 DIAGNOSIS — D68318 Other hemorrhagic disorder due to intrinsic circulating anticoagulants, antibodies, or inhibitors: Secondary | ICD-10-CM | POA: Insufficient documentation

## 2022-05-28 NOTE — ED Triage Notes (Signed)
Pt to ED for fall 3 days ago. Pt woke up this morning and was sore all over. Pt went to orthopedic doctor just now, and they sent her here bc she could not remember if she hit her head when she fell. Pt does also have a headache.

## 2022-05-28 NOTE — ED Provider Triage Note (Signed)
Emergency Medicine Provider Triage Evaluation Note  Katie Buckley , a 71 y.o. female  was evaluated in triage.  Pt complains of headache. She fell 3 days ago and isn't sure if she hit her head. She is sore all over and went to see ortho today who advised her to come to the ER because she is on blood thinner.  Physical Exam  There were no vitals taken for this visit. Gen:   Awake, no distress   Resp:  Normal effort  MSK:   Moves extremities without difficulty  Other:    Medical Decision Making  Medically screening exam initiated at 6:05 PM.  Appropriate orders placed.  [name] J Cashin was informed that the remainder of the evaluation will be completed by another provider, this initial triage assessment does not replace that evaluation, and the importance of remaining in the ED until their evaluation is complete.   Victorino Dike, FNP 05/28/22 1807

## 2022-05-28 NOTE — ED Notes (Signed)
Patient called a third time with no answer.

## 2022-05-28 NOTE — ED Notes (Signed)
Patient called a second time with no answer.

## 2022-05-28 NOTE — ED Notes (Signed)
Patient called with no answer at this time.

## 2022-05-30 ENCOUNTER — Encounter: Payer: Self-pay | Admitting: *Deleted

## 2022-05-30 NOTE — Telephone Encounter (Signed)
Katie Buckley ask if she wanted to see a plastic surgeon because she has a breast implant that is leaking.

## 2022-05-30 NOTE — Addendum Note (Signed)
Addended by: Pleas Koch on: 05/30/2022 11:39 AM   Modules accepted: Orders

## 2022-05-30 NOTE — Telephone Encounter (Signed)
Please notify patient that I placed the referral to plastic surgery.

## 2022-06-01 ENCOUNTER — Telehealth: Payer: Self-pay | Admitting: *Deleted

## 2022-06-01 ENCOUNTER — Telehealth: Payer: Self-pay

## 2022-06-01 ENCOUNTER — Other Ambulatory Visit: Payer: Self-pay | Admitting: *Deleted

## 2022-06-01 DIAGNOSIS — R41840 Attention and concentration deficit: Secondary | ICD-10-CM | POA: Diagnosis not present

## 2022-06-01 DIAGNOSIS — F331 Major depressive disorder, recurrent, moderate: Secondary | ICD-10-CM | POA: Diagnosis not present

## 2022-06-01 DIAGNOSIS — G4700 Insomnia, unspecified: Secondary | ICD-10-CM | POA: Diagnosis not present

## 2022-06-01 DIAGNOSIS — Z86010 Personal history of colonic polyps: Secondary | ICD-10-CM

## 2022-06-01 DIAGNOSIS — F411 Generalized anxiety disorder: Secondary | ICD-10-CM | POA: Diagnosis not present

## 2022-06-01 NOTE — Telephone Encounter (Signed)
        Patient  visited [facility] on 12/17     Telephone encounter attempt :  1st  A HIPAA compliant voice message was left requesting a return call.  Instructed patient to call back .    [facility], Care Management  [phone] [address], [place], [place] [address] Phone: [phone] Email: [email or web address]

## 2022-06-01 NOTE — Telephone Encounter (Signed)
Patient called office on Tuesday, 05/30/2022 regarding needing to reschedule her colonoscopy because it was not completed on 04/12/2022. Patient stated that she was waiting for a phone call. I have sent a message to Herb Grays to see if Dr Vicente Males mention anything.  So today, I have called patient back. Due to patient not holding her Eliquis was the reason it was not performed. Patient seem confused a little bit regarding her medications.  I told her that I will call her in the next 2 weeks, 2 more times to remind her when to stop Eliquis, metformin, and glipizide.  Patient verbalized understanding.   Instructions with new dates will be sent.

## 2022-06-06 ENCOUNTER — Telehealth: Payer: Self-pay

## 2022-06-06 NOTE — Telephone Encounter (Signed)
        Patient  visited [facility] on 12/17     Telephone encounter attempt :  2nd  A HIPAA compliant voice message was left requesting a return call.  Instructed patient to call back    [facility]  [phone] [address], [place], [place] [address] Phone: [phone] Email: [email or web address]

## 2022-06-09 ENCOUNTER — Encounter: Payer: Self-pay | Admitting: Primary Care

## 2022-06-09 ENCOUNTER — Ambulatory Visit (INDEPENDENT_AMBULATORY_CARE_PROVIDER_SITE_OTHER): Payer: PPO | Admitting: Primary Care

## 2022-06-09 ENCOUNTER — Other Ambulatory Visit (INDEPENDENT_AMBULATORY_CARE_PROVIDER_SITE_OTHER): Payer: PPO

## 2022-06-09 VITALS — BP 148/64 | HR 56 | Temp 99.0°F | Ht 60.0 in | Wt 138.0 lb

## 2022-06-09 DIAGNOSIS — R1031 Right lower quadrant pain: Secondary | ICD-10-CM | POA: Diagnosis not present

## 2022-06-09 DIAGNOSIS — R41840 Attention and concentration deficit: Secondary | ICD-10-CM | POA: Diagnosis not present

## 2022-06-09 DIAGNOSIS — R051 Acute cough: Secondary | ICD-10-CM | POA: Insufficient documentation

## 2022-06-09 DIAGNOSIS — F411 Generalized anxiety disorder: Secondary | ICD-10-CM | POA: Diagnosis not present

## 2022-06-09 DIAGNOSIS — F331 Major depressive disorder, recurrent, moderate: Secondary | ICD-10-CM | POA: Diagnosis not present

## 2022-06-09 DIAGNOSIS — G4700 Insomnia, unspecified: Secondary | ICD-10-CM | POA: Diagnosis not present

## 2022-06-09 LAB — POCT INFLUENZA A/B
Influenza A, POC: NEGATIVE
Influenza B, POC: NEGATIVE

## 2022-06-09 LAB — POC COVID19 BINAXNOW: SARS Coronavirus 2 Ag: NEGATIVE

## 2022-06-09 MED ORDER — BENZONATATE 200 MG PO CAPS: 200.0000 mg | ORAL_CAPSULE | Freq: Three times a day (TID) | ORAL | 0 refills | Status: DC | PRN

## 2022-06-09 MED ORDER — AMOXICILLIN-POT CLAVULANATE 875-125 MG PO TABS: 1.0000 | ORAL_TABLET | Freq: Two times a day (BID) | ORAL | 0 refills | Status: DC

## 2022-06-09 NOTE — Patient Instructions (Addendum)
Start Augmentin antibiotics for the infection Take 1 tablet by mouth twice daily for 7 days.  You may take Benzonatate capsules for cough. Take 1 capsule by mouth three times daily as needed for cough.  It was a pleasure meeting you!

## 2022-06-09 NOTE — Progress Notes (Signed)
Subjective:    Patient ID: Katie Buckley, female    DOB: 10/06/1950, 71 y.o.   MRN: [id number]  HPI  Katie Buckley is a very pleasant 71 y.o. female patient of [name], NP with a history of CHF, hypertension, paroxysmal atrial fibrillation, allergic rhinitis, asthma, GERD, anemia, chronic fatigue who presents today to discuss nasal congestion.  Symptom onset 10 days ago with nasal congestion. She then developed cough, chest congestion, ear fullness, and fevers. She has not tested for Covid-19 infection. Her temperatures are low grade ranging 99-100.3.  Her cough is now productive with yellow sputum. Today she's feeling minimally improved. She's been taking Gaviscon, Coricidin, Afrin nasal spray, and nasal rinses.    Review of Systems  Constitutional:  Positive for fatigue.  HENT:  Positive for congestion and sinus pressure. Negative for postnasal drip.   Respiratory:  Positive for cough.   Gastrointestinal:  Positive for nausea.         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    Social History   Socioeconomic History   Marital status: Married    Spouse name: Biochemist, clinical, husband   Number of children: 1   Years of education: Not on file   Highest education level: Not on file  Occupational History    Comment: retired    Fish farm manager: RETIRED  Tobacco Use   Smoking status: Never   Smokeless tobacco: Never  Vaping Use   Vaping Use: Never used  Substance and Sexual Activity   Alcohol use: No   Drug use: No   Sexual activity: Not Currently    Partners: Male    Birth control/protection: Post-menopausal   Other Topics Concern   Not on file  Social History Narrative   Not on file   Social Determinants of Health   Financial Resource Strain: Low Risk  (01/31/2022)   Overall Financial Resource Strain (CARDIA)    Difficulty of Paying Living Expenses: Not hard at all  Food Insecurity: No Food Insecurity (01/31/2022)   Hunger Vital Sign    Worried About Running Out of Food in the Last Year: Never true    Ran Out of Food in the Last Year: Never true  Transportation Needs: No Transportation Needs (01/31/2022)   PRAPARE - Transportation    Lack of Transportation (Medical): No    Lack of Transportation (Non-Medical): No  Physical Activity: Inactive (01/31/2022)   Exercise Vital Sign    Days of Exercise per Week: 0 days    Minutes of Exercise per Session: 0 min  Stress: Stress Concern Present (01/31/2022)   [place]    Feeling of Stress : To some extent  Social Connections: Moderately Isolated (01/31/2022)   Social Connection and Isolation Panel [NHANES]    Frequency of Communication with Friends and Family: Twice a week    Frequency of Social Gatherings with Friends and Family: Once a week    Attends Religious Services: Never    Marine scientist or Organizations: No  Attends Archivist Meetings: Never    Marital Status: Married  Human resources officer Violence: Not At Risk (01/31/2022)   Humiliation, Afraid, Rape, and Kick questionnaire    Fear of Current or Ex-Partner: No    Emotionally Abused: No    Physically Abused: No    Sexually Abused: No    Past Surgical History:  Procedure Laterality Date   ANTERIOR CERVICAL DECOMP/DISCECTOMY FUSION N/A 11/15/2020   Procedure: ANTERIOR CERVICAL DECOMPRESSION/DISCECTOMY FUSION 1 LEVEL C3/4;  Surgeon: Deetta Perla, MD;  Location: [facility];  Service: Neurosurgery;  Laterality: N/A;   AORTIC VALVE REPLACEMENT N/A 09/19/2021   Procedure: AORTIC VALVE REPLACEMENT Using  22m Edwards Resilia Inspiris Valve;  Surgeon: BGaye Pollack MD;  [place]  Service: Open Heart Surgery;  Laterality: N/A;   AUGMENTATION MAMMAPLASTY Bilateral    breast implants   Bone Spur  2007   foot   CARDIAC CATHETERIZATION     Carpal Tunnel Symdrome  2008   as repeated in 2009   CATARACT EXTRACTION W/PHACO Left 01/16/2019   Procedure: CATARACT EXTRACTION PHACO AND INTRAOCULAR LENS PLACEMENT (IPalacios LEFT DIABETES VISION BLUE;  Surgeon: HMarchia Meiers MD;  Location: [facility];  Service: Ophthalmology;  Laterality: Left;  [place] 00:47 CDE 6.62 Fluid pack lot # [id number]   CATARACT EXTRACTION W/PHACO Right 02/06/2019   Procedure: CATARACT EXTRACTION PHACO AND INTRAOCULAR LENS PLACEMENT (IOC);  Surgeon: HMarchia Meiers MD;  Location: [facility];  Service: Ophthalmology;  Laterality: Right;  [place]00:54.6 CDE 6.52 FLUID PACK LOT # [id number]   CHOLECYSTECTOMY  2007   COLONOSCOPY WITH PROPOFOL N/A 11/17/2015   Procedure: COLONOSCOPY WITH PROPOFOL;  Surgeon: RManya Silvas MD;  [facility];  Service: Endoscopy;  Laterality: N/A;   CORONARY ARTERY BYPASS GRAFT N/A 09/19/2021   Procedure: CORONARY ARTERY BYPASS GRAFTING X1 Using Left Internal Mammory artery;  Surgeon: BGaye Pollack MD;  [place]  Service: Open Heart Surgery;  Laterality: N/A;   ESOPHAGOGASTRODUODENOSCOPY (EGD) WITH PROPOFOL N/A 11/17/2015   Procedure: ESOPHAGOGASTRODUODENOSCOPY (EGD) WITH PROPOFOL;  Surgeon: RManya Silvas MD;  [facility];  Service: Endoscopy;  Laterality: N/A;   INTRAVASCULAR PRESSURE WIRE/FFR STUDY N/A 05/24/2021   Procedure: INTRAVASCULAR PRESSURE WIRE/FFR STUDY;  Surgeon: CYolonda Kida MD;  [facility];  Service: Cardiovascular;  Laterality: N/A;   MANDIBLE SURGERY     NASAL SINUS SURGERY  03/2013   Multile surgery 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 STamala Julian  RIGHT/LEFT HEART CATH  AND CORONARY ANGIOGRAPHY N/A 05/24/2021   Procedure: RIGHT/LEFT HEART CATH AND CORONARY ANGIOGRAPHY;  Surgeon: CYolonda Kida MD;  [facility];  Service: Cardiovascular;  Laterality: N/A;   TEE WITHOUT CARDIOVERSION N/A 09/19/2021   Procedure: TRANSESOPHAGEAL ECHOCARDIOGRAM (TEE);  Surgeon: BGaye Pollack MD;  [place] [place] [facility]  Service: Open Heart Surgery;  Laterality: N/A;   TONSILLECTOMY  1960    Family History  Problem Relation Age of Onset   Lung cancer Mother    Diabetes Father    Irritable bowel syndrome Sister    Breast cancer Daughter    Breast cancer Maternal Aunt    Colon cancer Brother     Allergies  Allergen Reactions   Doxycycline Other (See Comments)    Emotional changes/anxious   Biaxin [Clarithromycin]     GI upset, and bad taste in mouth.    Current Outpatient Medications on File  Prior to Visit  Medication Sig Dispense Refill   acetaminophen (TYLENOL) 650 MG CR tablet Take 1,300 mg by mouth every 8 (eight) hours as needed for pain (pain).     atorvastatin (LIPITOR) 80 MG tablet      buPROPion (WELLBUTRIN SR) 150 MG 12 hr tablet Take 150 mg by mouth 2 (two) times daily.     butalbital-acetaminophen-caffeine (FIORICET) 50-325-40 MG tablet TAKE 1 TABLET BY MOUTH EVERY 6 HOURS AS NEEDED FOR HEADACHE (Patient taking differently: Take 1 tablet by mouth every 6 (six) hours as needed for headache.) 30 tablet 4   ezetimibe (ZETIA) 10 MG tablet TAKE 1 TABLET BY MOUTH EVERYDAY AT BEDTIME (Patient taking differently: Take 10 mg by mouth at bedtime.) 90 tablet 0   fexofenadine (ALLEGRA) 180 MG tablet Take 180 mg by mouth daily as needed for allergies or rhinitis.     hydrOXYzine (VISTARIL) 25 MG capsule Take 25 mg by mouth daily as needed for anxiety.     irbesartan (AVAPRO) 75 MG tablet TAKE 1 TABLET BY MOUTH EVERY DAY 90 tablet 4   Lancet Devices MISC      loperamide (IMODIUM) 2 MG capsule Take 2-4 mg by mouth daily as needed for diarrhea or loose  stools.     metFORMIN (GLUCOPHAGE-XR) 500 MG 24 hr tablet TAKE 2 TABLETS BY MOUTH EVERY DAY 180 tablet 3   metoprolol tartrate (LOPRESSOR) 50 MG tablet Take 1 tablet (50 mg total) by mouth 2 (two) times daily. 60 tablet 2   Olopatadine HCl 0.7 % SOLN Place 1 drop into both eyes daily as needed (allergies).     pregabalin (LYRICA) 75 MG capsule Take 1 capsule (75 mg total) by mouth 3 (three) times daily.     promethazine (PHENERGAN) 25 MG tablet Take 25 mg by mouth every 8 (eight) hours as needed.     RABEprazole (ACIPHEX) 20 MG tablet TAKE 1 TABLET BY MOUTH EVERY DAY 90 tablet 4   sertraline (ZOLOFT) 100 MG tablet Take 2 tablets (200 mg total) by mouth daily.     simethicone (MYLICON) 80 MG chewable tablet Chew 80 mg by mouth every 6 (six) hours as needed for flatulence.     valACYclovir (VALTREX) 1000 MG tablet TAKE 1 TABLET DAILY FOR 5 DAYS AS NEEDED 90 tablet 1   apixaban (ELIQUIS) 5 MG TABS tablet Take 1 tablet (5 mg total) by mouth 2 (two) times daily. 60 tablet 2   glipiZIDE (GLUCOTROL) 5 MG tablet Take 1 tablet (5 mg total) by mouth daily. (Patient taking differently: Take 5 mg by mouth daily. 0.5 tablet) 30 tablet 2   montelukast (SINGULAIR) 10 MG tablet Take 1 tablet (10 mg total) by mouth at bedtime as needed (allergies). Home med. (Patient not taking: Reported on 06/09/2022)     ondansetron (ZOFRAN ODT) 4 MG disintegrating tablet Take 1 tablet (4 mg total) by mouth every 8 (eight) hours as needed for nausea or vomiting. (Patient not taking: Reported on 06/09/2022) 20 tablet 3   No current facility-administered medications on file prior to visit.    BP (!) 148/64   Pulse (!) 56   Temp 99 F (37.2 C) (Temporal)   Ht 5' (1.524 m)   Wt 138 lb (62.6 kg)   SpO2 96%   BMI 26.95 kg/m  Objective:   Physical Exam Constitutional:      Appearance: She is ill-appearing.  HENT:     Right Ear: Ear canal normal.     Left Ear:  Ear canal normal.     Ears:     Comments: Fluid noted behind  TM's bilaterally     Nose:     Right Sinus: No maxillary sinus tenderness or frontal sinus tenderness.     Left Sinus: No maxillary sinus tenderness or frontal sinus tenderness.     Mouth/Throat:     Pharynx: No posterior oropharyngeal erythema.  Eyes:     Conjunctiva/sclera: Conjunctivae normal.  Cardiovascular:     Rate and Rhythm: Normal rate and regular rhythm.  Pulmonary:     Effort: Pulmonary effort is normal.     Breath sounds: Normal breath sounds. No wheezing or rales.  Musculoskeletal:     Cervical back: Neck supple.  Lymphadenopathy:     Cervical: No cervical adenopathy.  Skin:    General: Skin is warm and dry.           Assessment & Plan:   Problem List Items Addressed This Visit       Other   Acute cough - Primary    Negative for influenza and Covid-19 infection today.  Given duration of symptoms, coupled with presentation, will treat for presumed bacterial involvement.  Start Augmentin 875-125 mg BID x 7 days. You may take Benzonatate capsules for cough. Take 1 capsule by mouth three times daily as needed for cough.  Lungs clear today, no need for chest imaging.  Return precautions provided.       Relevant Medications   amoxicillin-clavulanate (AUGMENTIN) 875-125 MG tablet   benzonatate (TESSALON) 200 MG capsule   Other Relevant Orders   POC COVID-19 (Completed)   Influenza A/B (Completed)       Pleas Koch, NP

## 2022-06-09 NOTE — Assessment & Plan Note (Addendum)
Negative for influenza and Covid-19 infection today.  Given duration of symptoms, coupled with presentation, will treat for presumed bacterial involvement.  Start Augmentin 875-125 mg BID x 7 days. You may take Benzonatate capsules for cough. Take 1 capsule by mouth three times daily as needed for cough.  Lungs clear today, no need for chest imaging.  Return precautions provided.

## 2022-06-10 ENCOUNTER — Other Ambulatory Visit: Payer: Self-pay | Admitting: Family Medicine

## 2022-06-10 DIAGNOSIS — E1140 Type 2 diabetes mellitus with diabetic neuropathy, unspecified: Secondary | ICD-10-CM

## 2022-06-13 ENCOUNTER — Telehealth: Payer: Self-pay

## 2022-06-13 ENCOUNTER — Encounter: Payer: Self-pay | Admitting: Family

## 2022-06-13 ENCOUNTER — Encounter: Payer: Self-pay | Admitting: Internal Medicine

## 2022-06-13 LAB — FECAL OCCULT BLOOD, IMMUNOCHEMICAL: Fecal Occult Bld: POSITIVE — AB

## 2022-06-13 NOTE — Telephone Encounter (Signed)
She is not having SOB or chest pain. She states that she was sick and feeling better but this am she woke up feeling fatigue. She feels like her symptoms from when she was sick is coming back. She was going to call hematology to see about her iron levels.

## 2022-06-13 NOTE — Telephone Encounter (Signed)
Thank you. Pt already seeing GI and has appt /colonscopy January 10th.   Please call and ensure pt not with bloody stool and or extreme fatigue or lethargy. F/u with GI. More urgent assessment if any of these symptoms.

## 2022-06-13 NOTE — Telephone Encounter (Signed)
Katie Buckley with [place] lab called report on + IFOB; report is in [name]. Sending note to T Dugal FNP and [facility] pool and will teams [place] CMA and report was placed in lab critical notebook.

## 2022-06-13 NOTE — Telephone Encounter (Signed)
Called patient reviewed all information and repeated back to me. Will call if any questions.  She said she is feeling more Fatigue this am advised her to go to the ER she denies seeing any blood in her stool

## 2022-06-13 NOTE — Progress Notes (Signed)
Duplicate.  Pt with colonoscopy 06/21/22 with GI.  Also advised in phone note how to take care of result. Pt to be contacted.

## 2022-06-13 NOTE — Telephone Encounter (Signed)
Is she with any sob or chest pain?  Is the fatigue from her recent sickness or is this new and different?  If she thinks still sick, we could assess her with an appt next available? Make sure she doesn't have pneumonia.   However if with any worsening sob or chest pain to go to ER

## 2022-06-14 ENCOUNTER — Other Ambulatory Visit: Payer: Self-pay | Admitting: Gastroenterology

## 2022-06-14 NOTE — Telephone Encounter (Signed)
Agree with precautions given to pt  Agree with nurse assessment in plan.  Thank you for speaking with them. 

## 2022-06-14 NOTE — Telephone Encounter (Signed)
I spoke with pt as requested in teams; pt said she has not seen any blood in BM and no abd pain. Pt did have + IFOB. Pt said she had recovered from previous episode of fatigue. Fatigue, feeling tired, no appetite, no energy, and feeling off balance started on 06/12/2022. Pt said right now she feels OK but has not been up yet. Pt said last night when up and moving around pt did feel fatigue, no energy at all,and pt felt off balance. Pt said the room was not spinning but pt felt like she was spinning. Pt had to grab the wall at times to steady herself. Pt has not fallen. Pt said the off balance has been most of the time for the last 2 days when she is up moving around. NO CP, SOB or wheezing.pt said since Christmas she has had a cough that is worsening; sometimes is dry cough and other times is prod cough with yellow phlegm. No S/T but pt said does have lt earache. On and off fever, the highest was last wk at 100.1 and on 06/13/22 temp 99 and pt is taking tylenol. Pt said she does not have an appetite but pt is still eating and drinking. Pt said she had some constipation and pt took colace and now having loose stools but not watery diarrhea.  Pt has not seen any blood and no abd pain.Pt said at this time she does not feel in distress and declines UC or ED. Pt already has appt with T Dugal FNP on 06/15/22 at 11 AM and pt wants to keep that appt. UC & ED precautions given to pt and pt voiced understanding. Sending note to Red Christians FNP.

## 2022-06-14 NOTE — Telephone Encounter (Signed)
Katie Buckley can we touch base on this back and forth? Needs to be addressed. Can we chat in person?

## 2022-06-14 NOTE — Telephone Encounter (Signed)
I spoke with pt and he said he was behind on getting colonoscopy; pt not having any GI issues at this time; no abd pain and no blood in BM. Sent this message to pts chart. Pt already has appt with Red Christians FNP scheduled for 06/15/22 at 12;20 and UC & ED precautions given and pt voiced understanding and will discuss colonoscopy at appt on 06/15/22. Sending note to Red Christians FNP  in [place] chart as Juluis Rainier.

## 2022-06-14 NOTE — Telephone Encounter (Signed)
So just to clarify your documentation, was she advised to go to Er if any SOB, increasing fatigue, blood in stool or chest pain starts? Or is she going to ER now?

## 2022-06-15 ENCOUNTER — Inpatient Hospital Stay: Payer: PPO | Attending: Internal Medicine

## 2022-06-15 ENCOUNTER — Inpatient Hospital Stay (HOSPITAL_BASED_OUTPATIENT_CLINIC_OR_DEPARTMENT_OTHER): Payer: PPO | Admitting: Hospice and Palliative Medicine

## 2022-06-15 ENCOUNTER — Ambulatory Visit (INDEPENDENT_AMBULATORY_CARE_PROVIDER_SITE_OTHER)
Admission: RE | Admit: 2022-06-15 | Discharge: 2022-06-15 | Disposition: A | Discharge status: Home / Self Care | Payer: PPO | Source: Ambulatory Visit | Attending: Family | Admitting: Family

## 2022-06-15 ENCOUNTER — Inpatient Hospital Stay: Payer: PPO

## 2022-06-15 ENCOUNTER — Other Ambulatory Visit: Payer: Self-pay

## 2022-06-15 ENCOUNTER — Encounter: Payer: Self-pay | Admitting: Hospice and Palliative Medicine

## 2022-06-15 ENCOUNTER — Ambulatory Visit (INDEPENDENT_AMBULATORY_CARE_PROVIDER_SITE_OTHER): Payer: PPO | Admitting: Family

## 2022-06-15 ENCOUNTER — Encounter: Payer: Self-pay | Admitting: Family

## 2022-06-15 VITALS — BP 154/51 | HR 52 | Temp 98.9°F | Resp 20 | Ht 60.0 in | Wt 135.0 lb

## 2022-06-15 VITALS — BP 132/68 | HR 56 | Ht 60.0 in | Wt 136.0 lb

## 2022-06-15 DIAGNOSIS — M545 Low back pain, unspecified: Secondary | ICD-10-CM

## 2022-06-15 DIAGNOSIS — R5383 Other fatigue: Secondary | ICD-10-CM | POA: Diagnosis not present

## 2022-06-15 DIAGNOSIS — R195 Other fecal abnormalities: Secondary | ICD-10-CM

## 2022-06-15 DIAGNOSIS — D649 Anemia, unspecified: Secondary | ICD-10-CM

## 2022-06-15 DIAGNOSIS — D509 Iron deficiency anemia, unspecified: Secondary | ICD-10-CM | POA: Diagnosis not present

## 2022-06-15 DIAGNOSIS — J209 Acute bronchitis, unspecified: Secondary | ICD-10-CM | POA: Diagnosis not present

## 2022-06-15 DIAGNOSIS — R059 Cough, unspecified: Secondary | ICD-10-CM | POA: Diagnosis not present

## 2022-06-15 DIAGNOSIS — K921 Melena: Secondary | ICD-10-CM

## 2022-06-15 DIAGNOSIS — R0689 Other abnormalities of breathing: Secondary | ICD-10-CM

## 2022-06-15 DIAGNOSIS — R3915 Urgency of urination: Secondary | ICD-10-CM | POA: Diagnosis not present

## 2022-06-15 DIAGNOSIS — R011 Cardiac murmur, unspecified: Secondary | ICD-10-CM | POA: Diagnosis not present

## 2022-06-15 DIAGNOSIS — J440 Chronic obstructive pulmonary disease with acute lower respiratory infection: Secondary | ICD-10-CM

## 2022-06-15 DIAGNOSIS — N644 Mastodynia: Secondary | ICD-10-CM | POA: Diagnosis not present

## 2022-06-15 DIAGNOSIS — J40 Bronchitis, not specified as acute or chronic: Secondary | ICD-10-CM

## 2022-06-15 DIAGNOSIS — Z85828 Personal history of other malignant neoplasm of skin: Secondary | ICD-10-CM | POA: Diagnosis not present

## 2022-06-15 LAB — IRON AND TIBC
Iron: 75 ug/dL (ref 28–170)
Saturation Ratios: 23 % (ref 10.4–31.8)
TIBC: 322 ug/dL (ref 250–450)
UIBC: 247 ug/dL

## 2022-06-15 LAB — CBC WITH DIFFERENTIAL/PLATELET
Abs Immature Granulocytes: 0.03 10*3/uL (ref 0.00–0.07)
Basophils Absolute: 0 10*3/uL (ref 0.0–0.1)
Basophils Relative: 0 %
Eosinophils Absolute: 0.1 10*3/uL (ref 0.0–0.5)
Eosinophils Relative: 2 %
HCT: 37.3 % (ref 36.0–46.0)
Hemoglobin: 12.8 g/dL (ref 12.0–15.0)
Immature Granulocytes: 0 %
Lymphocytes Relative: 23 %
Lymphs Abs: 1.6 10*3/uL (ref 0.7–4.0)
MCH: 26.8 pg (ref 26.0–34.0)
MCHC: 34.3 g/dL (ref 30.0–36.0)
MCV: 78.2 fL — ABNORMAL LOW (ref 80.0–100.0)
Monocytes Absolute: 0.5 10*3/uL (ref 0.1–1.0)
Monocytes Relative: 7 %
Neutro Abs: 5 10*3/uL (ref 1.7–7.7)
Neutrophils Relative %: 68 %
Platelets: 168 10*3/uL (ref 150–400)
RBC: 4.77 MIL/uL (ref 3.87–5.11)
RDW: 14.5 % (ref 11.5–15.5)
WBC: 7.3 10*3/uL (ref 4.0–10.5)
nRBC: 0 % (ref 0.0–0.2)

## 2022-06-15 LAB — FERRITIN: Ferritin: 310 ng/mL — ABNORMAL HIGH (ref 11–307)

## 2022-06-15 MED ORDER — EZETIMIBE 10 MG PO TABS: 10.0000 mg | ORAL_TABLET | Freq: Every day | ORAL | Status: DC

## 2022-06-15 MED ORDER — AZITHROMYCIN 250 MG PO TABS: ORAL_TABLET | ORAL | 0 refills | Status: AC

## 2022-06-15 NOTE — Progress Notes (Signed)
Symptom Management [place] at [facility] [phone] [phone]  Patient Care Team: Eugenia Pancoast, [place] as PCP - General (Family Medicine) Chauncey Mann, MD as Referring Physician (Psychiatry) Vladimir Crofts, MD as Consulting Physician (Neurology) Germaine Pomfret, [facility] (Pharmacist) Yolonda Kida, MD as Consulting Physician (Cardiology)   NAME OF PATIENT: Katie Buckley  [id number]  [birth year]   DATE OF VISIT: 06/15/22  REASON FOR CONSULT: Katie Buckley is a 72 y.o. female with multiple medical problems including aortic stenosis status post bioprosthetic valve in May 2023, Barrett's esophagus, anxiety, and history of skin cancer who is followed by hematology for anemia.   INTERVAL HISTORY: Patient last saw Dr. Loni Muse on 04/25/2022.  Patient is status post 2 doses of IV Feraheme in October 2023 with good response demonstrated a on follow-up labs.  Patient is pending GI workup.  Patient was seen today by PCP and was treated with antibiotics for upper respiratory infection.  She subsequently presented to our clinic with request to recheck labs due to fatigue.  Patient says that she has had worse fatigue over the past several days.  She denies shortness of breath.  She has had occasional nausea.  She does endorse nasal congestion and nonproductive cough.  She denies any black or tarry stools or hematochezia.  No blood in urine. Patient offers no further specific complaints today.   PAST MEDICAL HISTORY: 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   Frequent sinus infections    Headache    Tension headaches   History of Barrett's esophagus    History of colon polyps    Hyperchloremia    MRSA (methicillin resistant staph aureus) culture positive 2018   history of, in hand   Palpitations    Seasonal allergies    Skin cancer    2 basal cell cancer and 1  squammous cell    PAST SURGICAL HISTORY:  Past Surgical History:  Procedure Laterality Date   ANTERIOR CERVICAL DECOMP/DISCECTOMY FUSION N/A 11/15/2020   Procedure: ANTERIOR CERVICAL DECOMPRESSION/DISCECTOMY FUSION 1 LEVEL C3/4;  Surgeon: Deetta Perla, MD;  Location: [facility];  Service: Neurosurgery;  Laterality: N/A;   AORTIC VALVE REPLACEMENT N/A 09/19/2021   Procedure: AORTIC VALVE REPLACEMENT Using 68m Edwards Resilia Inspiris Valve;  Surgeon: BGaye Pollack MD;  Location: [facility]  Service: Open Heart Surgery;  Laterality: N/A;   AUGMENTATION MAMMAPLASTY Bilateral    breast implants   Bone Spur  2007   foot   CARDIAC CATHETERIZATION     Carpal Tunnel Symdrome  2008   as repeated in 2009   CATARACT EXTRACTION W/PHACO Left 01/16/2019   Procedure: CATARACT EXTRACTION PHACO AND INTRAOCULAR LENS PLACEMENT ([place] LEFT DIABETES VISION BLUE;  Surgeon: HMarchia Meiers MD;  Location: [facility];  Service: Ophthalmology;  Laterality: Left;  [place] 00:47 CDE 6.62 Fluid pack lot # [id number]   CATARACT EXTRACTION W/PHACO Right 02/06/2019   Procedure: CATARACT EXTRACTION PHACO AND INTRAOCULAR LENS PLACEMENT (IOC);  Surgeon: HMarchia Meiers MD;  Location: [facility];  Service: Ophthalmology;  Laterality: Right;  [place]00:54.6 CDE 6.52 FLUID PACK LOT # [id number]   CHOLECYSTECTOMY  2007   COLONOSCOPY WITH PROPOFOL N/A 11/17/2015   Procedure: COLONOSCOPY WITH PROPOFOL;  Surgeon: RManya Silvas MD;  [facility]  Service: Endoscopy;  Laterality: N/A;   CORONARY ARTERY BYPASS GRAFT N/A 09/19/2021   Procedure: CORONARY ARTERY  BYPASS GRAFTING X1 Using Left Internal Mammory artery;  Surgeon: Gaye Pollack, MD;  Location: [place];  Service: Open Heart Surgery;  Laterality: N/A;   ESOPHAGOGASTRODUODENOSCOPY (EGD) WITH PROPOFOL N/A 11/17/2015   Procedure: ESOPHAGOGASTRODUODENOSCOPY (EGD) WITH PROPOFOL;  Surgeon: Manya Silvas, MD;  Location[facility];  Service: Endoscopy;  Laterality: N/A;    INTRAVASCULAR PRESSURE WIRE/FFR STUDY N/A 05/24/2021   Procedure: INTRAVASCULAR PRESSURE WIRE/FFR STUDY;  Surgeon: Yolonda Kida, MD;  Location: [place] [facility];  Service: Cardiovascular;  Laterality: N/A;   MANDIBLE SURGERY     NASAL SINUS SURGERY  03/2013   Multile surgery Dr. Carlis Abbott. DX eosinophilic sinusitis [id number]   Release of Trigger Finger Right 10/2010   Dr. Tamala Julian   RIGHT/LEFT HEART CATH AND CORONARY ANGIOGRAPHY N/A 05/24/2021   Procedure: RIGHT/LEFT HEART CATH AND CORONARY ANGIOGRAPHY;  Surgeon: Yolonda Kida, MD;  Location: [place] [facility];  Service: Cardiovascular;  Laterality: N/A;   TEE WITHOUT CARDIOVERSION N/A 09/19/2021   Procedure: TRANSESOPHAGEAL ECHOCARDIOGRAM (TEE);  Surgeon: Gaye Pollack, MD;  Location: [place];  Service: Open Heart Surgery;  Laterality: N/A;   TONSILLECTOMY  1960    HEMATOLOGY/ONCOLOGY HISTORY:  Oncology History   No history exists.    ALLERGIES:  is allergic to doxycycline and biaxin [clarithromycin].  MEDICATIONS:  Current Outpatient Medications  Medication Sig Dispense Refill   acetaminophen (TYLENOL) 650 MG CR tablet Take 1,300 mg by mouth every 8 (eight) hours as needed for pain (pain).     apixaban (ELIQUIS) 5 MG TABS tablet Take 1 tablet (5 mg total) by mouth 2 (two) times daily. 60 tablet 2   benzonatate (TESSALON) 200 MG capsule Take 1 capsule (200 mg total) by mouth 3 (three) times daily as needed for cough. 15 capsule 0   buPROPion (WELLBUTRIN SR) 150 MG 12 hr tablet Take 150 mg by mouth 2 (two) times daily.     butalbital-acetaminophen-caffeine (FIORICET) 50-325-40 MG tablet TAKE 1 TABLET BY MOUTH EVERY 6 HOURS AS NEEDED FOR HEADACHE (Patient taking differently: Take 1 tablet by mouth every 6 (six) hours as needed for headache.) 30 tablet 4   ezetimibe (ZETIA) 10 MG tablet Take 1 tablet (10 mg total) by mouth at bedtime.     fexofenadine (ALLEGRA) 180 MG tablet Take 180 mg by mouth daily as needed for allergies or  rhinitis.     FLUoxetine (PROZAC) 20 MG capsule Take 1 tablet by mouth daily.     glipiZIDE (GLUCOTROL) 5 MG tablet TAKE 1 TABLET (5 MG TOTAL) BY MOUTH DAILY. 90 tablet 0   hydrOXYzine (VISTARIL) 25 MG capsule Take 25 mg by mouth daily as needed for anxiety.     irbesartan (AVAPRO) 75 MG tablet TAKE 1 TABLET BY MOUTH EVERY DAY 90 tablet 4   Lancet Devices MISC      metoprolol tartrate (LOPRESSOR) 50 MG tablet Take 1 tablet (50 mg total) by mouth 2 (two) times daily. 60 tablet 2   montelukast (SINGULAIR) 10 MG tablet Take 1 tablet (10 mg total) by mouth at bedtime as needed (allergies). Home med.     Olopatadine HCl 0.7 % SOLN Place 1 drop into both eyes daily as needed (allergies).     ondansetron (ZOFRAN ODT) 4 MG disintegrating tablet Take 1 tablet (4 mg total) by mouth every 8 (eight) hours as needed for nausea or vomiting. 20 tablet 3   pregabalin (LYRICA) 75 MG capsule Take 1 capsule (75 mg total) by mouth 3 (three) times  daily.     promethazine (PHENERGAN) 25 MG tablet Take 25 mg by mouth every 8 (eight) hours as needed.     RABEprazole (ACIPHEX) 20 MG tablet TAKE 1 TABLET BY MOUTH EVERY DAY 90 tablet 4   rosuvastatin (CRESTOR) 20 MG tablet Take 20 mg by mouth daily.     simethicone (MYLICON) 80 MG chewable tablet Chew 80 mg by mouth every 6 (six) hours as needed for flatulence.     traZODone (DESYREL) 50 MG tablet Take 50-100 mg by mouth at bedtime as needed for sleep.     valACYclovir (VALTREX) 1000 MG tablet TAKE 1 TABLET DAILY FOR 5 DAYS AS NEEDED 90 tablet 1   azithromycin (ZITHROMAX) 250 MG tablet Take 2 tablets on day 1, then 1 tablet daily on days 2 through 5 (Patient not taking: Reported on 06/15/2022) 6 tablet 0   loperamide (IMODIUM) 2 MG capsule Take 2-4 mg by mouth daily as needed for diarrhea or loose stools. (Patient not taking: Reported on 06/15/2022)     metFORMIN (GLUCOPHAGE-XR) 500 MG 24 hr tablet TAKE 2 TABLETS BY MOUTH EVERY DAY 180 tablet 3   Na Sulfate-K Sulfate-Mg Sulf  17.5-3.13-1.6 GM/177ML SOLN USE AS DIRECTED (Patient not taking: Reported on 06/15/2022) 354 mL 0   No current facility-administered medications for this visit.    VITAL SIGNS: BP (!) 154/51   Pulse (!) 52   Temp 98.9 F (37.2 C) (Tympanic)   Resp 20   Ht 5' (1.524 m)   Wt 135 lb (61.2 kg)   BMI 26.37 kg/m  Filed Weights   06/15/22 1430  Weight: 135 lb (61.2 kg)    Estimated body mass index is 26.37 kg/m as calculated from the following:   Height as of this encounter: 5' (1.524 m).   Weight as of this encounter: 135 lb (61.2 kg).  LABS: CBC:    Component Value Date/Time   WBC 5.9 04/21/2022 1050   HGB 12.9 04/21/2022 1050   HGB 11.2 09/01/2020 0907   HCT 40.3 04/21/2022 1050   HCT 35.2 09/01/2020 0907   PLT 133 (L) 04/21/2022 1050   PLT 212 09/01/2020 0907   MCV 82.8 04/21/2022 1050   MCV 82 09/01/2020 0907   MCV 86 02/08/2014 1717   NEUTROABS 3.6 04/21/2022 1050   NEUTROABS 8.9 (H) 04/30/2013 0524   LYMPHSABS 1.6 04/21/2022 1050   LYMPHSABS 1.5 04/30/2013 0524   MONOABS 0.4 04/21/2022 1050   MONOABS 0.2 04/30/2013 0524   EOSABS 0.3 04/21/2022 1050   EOSABS 0.0 04/30/2013 0524   BASOSABS 0.1 04/21/2022 1050   BASOSABS 0.0 04/30/2013 0524   Comprehensive Metabolic Panel:    Component Value Date/Time   NA 140 04/21/2022 1050   NA 135 02/09/2021 1107   NA 138 02/08/2014 1717   K 4.1 04/21/2022 1050   K 3.7 02/08/2014 1717   CL 106 04/21/2022 1050   CL 99 02/08/2014 1717   CO2 26 04/21/2022 1050   CO2 31 02/08/2014 1717   BUN 13 04/21/2022 1050   BUN 11 02/09/2021 1107   BUN 13 02/08/2014 1717   CREATININE 0.58 04/21/2022 1050   CREATININE 0.61 05/01/2017 0842   GLUCOSE 144 (H) 04/21/2022 1050   GLUCOSE 155 (H) 02/08/2014 1717   CALCIUM 9.3 04/21/2022 1050   CALCIUM 9.0 02/08/2014 1717   AST 44 (H) 04/21/2022 1050   AST 31 02/08/2014 1717   ALT 43 04/21/2022 1050   ALT 41 02/08/2014 1717   ALKPHOS 84 04/21/2022  1050   ALKPHOS 102 02/08/2014 1717    BILITOT 0.3 04/21/2022 1050   BILITOT 0.2 09/01/2020 0907   BILITOT 0.2 02/08/2014 1717   PROT 6.6 04/21/2022 1050   PROT 6.3 09/01/2020 0907   PROT 7.4 02/08/2014 1717   ALBUMIN 4.0 04/21/2022 1050   ALBUMIN 4.5 02/09/2021 1107   ALBUMIN 4.1 02/08/2014 1717    RADIOGRAPHIC STUDIES: DG Chest 2 View  Result Date: 06/15/2022 CLINICAL DATA:  Decreased left lower lobe breath sounds. Cough for 1 week. EXAM: CHEST - 2 VIEW COMPARISON:  Radiographs 11/15/2021 and 11/01/2021.  CT 10/31/2021. FINDINGS: The heart size and mediastinal contours are stable status post median sternotomy and aortic valve replacement. Possible mild residual postinflammatory scarring in both lungs. No recurrent airspace disease, edema, pleural effusion or pneumothorax. Densely calcified breast implants are noted bilaterally. There are degenerative changes throughout the spine associated with a mild convex right thoracic scoliosis. No acute osseous findings are seen. IMPRESSION: No evidence of acute cardiopulmonary process. Possible mild residual postinflammatory scarring in both lungs. Electronically Signed   By: Richardean Sale M.D.   On: 06/15/2022 12:59   CT Head Wo Contrast  Result Date: 05/28/2022 CLINICAL DATA:  Trauma, headache. EXAM: CT HEAD WITHOUT CONTRAST CT CERVICAL SPINE WITHOUT CONTRAST TECHNIQUE: Multidetector CT imaging of the head and cervical spine was performed following the standard protocol without intravenous contrast. Multiplanar CT image reconstructions of the cervical spine were also generated. RADIATION DOSE REDUCTION: This exam was performed according to the departmental dose-optimization program which includes automated exposure control, adjustment of the mA and/or kV according to patient size and/or use of iterative reconstruction technique. COMPARISON:  CT head 08/03/2016.  MRI brain 03/16/2020. FINDINGS: CT HEAD FINDINGS Brain: No evidence of acute infarction, hemorrhage, hydrocephalus, extra-axial  collection or mass lesion/mass effect. Rounded calcified meningioma in the high right parietal region measures 5 mm in diameter and is unchanged. Vascular: No hyperdense vessel or unexpected calcification. Skull: Normal. Negative for fracture or focal lesion. Sinuses/Orbits: No acute finding. Other: None. CT CERVICAL SPINE FINDINGS Alignment: Normal. Skull base and vertebrae: No acute fracture. No primary bone lesion or focal pathologic process. Anterior fusion plate and disc spacer present at C3-C4. No hardware loosening. Soft tissues and spinal canal: No prevertebral fluid or swelling. No visible canal hematoma. Disc levels: Mild disc space narrowing and endplate osteophyte formation is seen throughout the cervical spine. There is mild neural foraminal stenosis on the left secondary to uncovertebral spurring and facet arthropathy at C5-C6. There is moderate neural foraminal stenosis on the right secondary to uncovertebral spurring and facet arthropathy at C3-C4. There is no significant central canal stenosis at any level. Upper chest: Negative. Other: None. IMPRESSION: 1. No acute intracranial process. 2. No acute fracture or traumatic subluxation of the cervical spine. 3. Postsurgical changes at C3-C4. Electronically Signed   By: Ronney Asters M.D.   On: 05/28/2022 19:50   CT Cervical Spine Wo Contrast  Result Date: 05/28/2022 CLINICAL DATA:  Trauma, headache. EXAM: CT HEAD WITHOUT CONTRAST CT CERVICAL SPINE WITHOUT CONTRAST TECHNIQUE: Multidetector CT imaging of the head and cervical spine was performed following the standard protocol without intravenous contrast. Multiplanar CT image reconstructions of the cervical spine were also generated. RADIATION DOSE REDUCTION: This exam was performed according to the departmental dose-optimization program which includes automated exposure control, adjustment of the mA and/or kV according to patient size and/or use of iterative reconstruction technique. COMPARISON:   CT head 08/03/2016.  MRI brain 03/16/2020. FINDINGS: CT  HEAD FINDINGS Brain: No evidence of acute infarction, hemorrhage, hydrocephalus, extra-axial collection or mass lesion/mass effect. Rounded calcified meningioma in the high right parietal region measures 5 mm in diameter and is unchanged. Vascular: No hyperdense vessel or unexpected calcification. Skull: Normal. Negative for fracture or focal lesion. Sinuses/Orbits: No acute finding. Other: None. CT CERVICAL SPINE FINDINGS Alignment: Normal. Skull base and vertebrae: No acute fracture. No primary bone lesion or focal pathologic process. Anterior fusion plate and disc spacer present at C3-C4. No hardware loosening. Soft tissues and spinal canal: No prevertebral fluid or swelling. No visible canal hematoma. Disc levels: Mild disc space narrowing and endplate osteophyte formation is seen throughout the cervical spine. There is mild neural foraminal stenosis on the left secondary to uncovertebral spurring and facet arthropathy at C5-C6. There is moderate neural foraminal stenosis on the right secondary to uncovertebral spurring and facet arthropathy at C3-C4. There is no significant central canal stenosis at any level. Upper chest: Negative. Other: None. IMPRESSION: 1. No acute intracranial process. 2. No acute fracture or traumatic subluxation of the cervical spine. 3. Postsurgical changes at C3-C4. Electronically Signed   By: Ronney Asters M.D.   On: 05/28/2022 19:50    PERFORMANCE STATUS (ECOG) : 1 - Symptomatic but completely ambulatory  Review of Systems Unless otherwise noted, a complete review of systems is negative.  Physical Exam General: NAD HEENT: sniffling, nasal congestion Pulmonary: unlabored Skin: no rashes Neurological: grossly nonfocal  IMPRESSION/PLAN: Fatigue -this appears to have worsened over the past several days and is likely attributed to her upper respiratory infection.  Patient is on antibiotics prescribed by PCP and I would  recommend continuing course of antibiotics in addition to as needed supportive care. No evidence of worsening anemia found on CBC today. If fatigue persists after resolution of infectious symptoms, could consider broader workup.  Anemia-hemoglobin today is normal.  Iron studies normal.  Patient is pending colonoscopy next week.  Follow up with Dr. Darrall Dears in 4 months unless symptomatic and needs to be seen sooner. Patient to follow up with PCP if needed regarding URI.   Patient expressed understanding and was in agreement with this plan. She also understands that She can call clinic at any time with any questions, concerns, or complaints.   Thank you for allowing me to participate in the care of this very pleasant patient.   Time Total: 15 minutes  Visit consisted of counseling and education dealing with the complex and emotionally intense issues of symptom management in the setting of serious illness.Greater than 50%  of this time was spent counseling and coordinating care related to the above assessment and plan.  Signed by: Altha Harm, PhD, NP-C

## 2022-06-15 NOTE — Patient Instructions (Addendum)
Stop augmentin, start zpack.  Let's see if this stops diarrhea and starts getting you to feel better.   Get labs today with hematology as scheduled, I will await these results.  Keep appt with colonoscopy as scheduled for blood in stool.   Try some heat and also lidocaine 4% over the counter , tylenol as needed.    Regards,   Eugenia Pancoast FNP-C

## 2022-06-15 NOTE — Progress Notes (Signed)
Established Patient Office Visit  Subjective:  Patient ID: Katie Buckley, female    DOB: [birth year]  Age: 73 y.o. MRN: [id number]  CC:  Chief Complaint  Patient presents with   Fatigue    Concerned about iron levels    HPI Katie Buckley is here today for follow up.   Pt is with acute concerns.  Was sick on 12/29 and was tested for covid and flu and both were negative. Does have slight sore throat, but has improved pretty well. With nasal congestion. Slight sinus pressure right sided. Cough productive with yellow sputum, has noticed mostly clear. Still taking antibiotics which she thinks has been helping . Did have a fever but not in three days, went up to 101F, takes tylenol with fever prn.   Taking augmentin 875/125 mg po bid x 10 days. She is currently on day six.  She is getting lab work for hematologist today , and they stated they would give her fluids and or iron today if needed.   Still with breast pain here and there. He has appt with breast surgeon in March 2024 in [place].  Mammogram 11/27 with right breast rupture. Left breast implant appears in tact per imaging.   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   Frequent sinus infections    Headache    Tension headaches   History of Barrett's esophagus    History of colon polyps    Hyperchloremia    MRSA (methicillin resistant staph aureus) culture positive 2018   history of, in hand   Palpitations    Seasonal allergies    Skin cancer    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    Past Surgical History:  Procedure Laterality Date   ANTERIOR CERVICAL DECOMP/DISCECTOMY FUSION N/A 11/15/2020   Procedure: ANTERIOR CERVICAL DECOMPRESSION/DISCECTOMY FUSION 1 LEVEL C3/4;  Surgeon: Deetta Perla, MD;  Location: [facility];  Service: Neurosurgery;  Laterality: N/A;   AORTIC VALVE REPLACEMENT N/A 09/19/2021   Procedure: AORTIC VALVE REPLACEMENT Using  55m Edwards Resilia Inspiris Valve;  Surgeon: BGaye Pollack MD;  Location: [facility]  Service: Open Heart Surgery;  Laterality: N/A;   AUGMENTATION MAMMAPLASTY Bilateral    breast implants   Bone Spur  2007   foot   CARDIAC CATHETERIZATION     Carpal Tunnel Symdrome  2008   as repeated in 2009   CATARACT EXTRACTION W/PHACO Left 01/16/2019   Procedure: CATARACT EXTRACTION PHACO AND INTRAOCULAR LENS PLACEMENT ([place] LEFT DIABETES VISION BLUE;  Surgeon: HMarchia Meiers MD;  Location: [facility];  Service: Ophthalmology;  Laterality: Left;  [place] 00:47 CDE 6.62 Fluid pack lot # [id number]   CATARACT EXTRACTION W/PHACO Right 02/06/2019   Procedure: CATARACT EXTRACTION PHACO AND INTRAOCULAR LENS PLACEMENT (IOC);  Surgeon: HMarchia Meiers MD;  Location: [facility];  Service: Ophthalmology;  Laterality: Right;  [place]00:54.6 CDE 6.52 FLUID PACK LOT # [id number]   CHOLECYSTECTOMY  2007   COLONOSCOPY WITH PROPOFOL N/A 11/17/2015   Procedure: COLONOSCOPY WITH PROPOFOL;  Surgeon: RManya Silvas MD;  [facility];  Service: Endoscopy;  Laterality: N/A;   CORONARY ARTERY BYPASS GRAFT N/A 09/19/2021   Procedure: CORONARY ARTERY BYPASS GRAFTING X1 Using Left Internal Mammory artery;  Surgeon: BGaye Pollack MD;  Location: [facility]  Service: Open Heart Surgery;  Laterality: N/A;   ESOPHAGOGASTRODUODENOSCOPY (EGD) WITH PROPOFOL N/A 11/17/2015   Procedure:  ESOPHAGOGASTRODUODENOSCOPY (EGD) WITH PROPOFOL;  Surgeon: Manya Silvas, MD;  [facility];  Service: Endoscopy;  Laterality: N/A;   INTRAVASCULAR PRESSURE WIRE/FFR STUDY N/A 05/24/2021   Procedure: INTRAVASCULAR PRESSURE WIRE/FFR STUDY;  Surgeon: Yolonda Kida, MD;  Location: [place] [facility];  Service: Cardiovascular;  Laterality: N/A;   MANDIBLE SURGERY     NASAL SINUS SURGERY  03/2013   Multile surgery Dr. Carlis Abbott. 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 Tamala Julian   RIGHT/LEFT HEART CATH  AND CORONARY ANGIOGRAPHY N/A 05/24/2021   Procedure: RIGHT/LEFT HEART CATH AND CORONARY ANGIOGRAPHY;  Surgeon: Yolonda Kida, MD;  Location: [place] [facility];  Service: Cardiovascular;  Laterality: N/A;   TEE WITHOUT CARDIOVERSION N/A 09/19/2021   Procedure: TRANSESOPHAGEAL ECHOCARDIOGRAM (TEE);  Surgeon: Gaye Pollack, MD;  Location: [place];  Service: Open Heart Surgery;  Laterality: N/A;   TONSILLECTOMY  1960    Family History  Problem Relation Age of Onset   Lung cancer Mother    Diabetes Father    Irritable bowel syndrome Sister    Throat cancer Brother    Breast cancer Daughter    Breast cancer Maternal Aunt     Social History   Socioeconomic History   Marital status: Married    Spouse name: Katie Buckley, husband   Number of children: 1   Years of education: Not on file   Highest education level: Not on file  Occupational History    Comment: retired    Fish farm manager: RETIRED  Tobacco Use   Smoking status: Never   Smokeless tobacco: Never  Vaping Use   Vaping Use: Never used  Substance and Sexual Activity   Alcohol use: No   Drug use: No   Sexual activity: Not Currently    Partners: Male    Birth control/protection: Post-menopausal  Other Topics Concern   Not on file  Social History Narrative   Not on file   Social Determinants of Health   Financial Resource Strain: Low Risk  (01/31/2022)   Overall Financial Resource Strain (CARDIA)    Difficulty of Paying Living Expenses: Not hard at all  Food Insecurity: No Food Insecurity (01/31/2022)   Hunger Vital Sign    Worried About Running Out of Food in the Last Year: Never true    [place] in the Last Year: Never true  Transportation Needs: No Transportation Needs (01/31/2022)   PRAPARE - Transportation    Lack of Transportation (Medical): No    Lack of Transportation (Non-Medical): No  Physical Activity: Inactive (01/31/2022)   Exercise Vital Sign    Days of Exercise per Week: 0 days    Minutes of Exercise  per Session: 0 min  Stress: Stress Concern Present (01/31/2022)   [place]    Feeling of Stress : To some extent  Social Connections: Moderately Isolated (01/31/2022)   Social Connection and Isolation Panel [NHANES]    Frequency of Communication with Friends and Family: Twice a week    Frequency of Social Gatherings with Friends and Family: Once a week    Attends Religious Services: Never    Marine scientist or Organizations: No    Attends Archivist Meetings: Never    Marital Status: Married  Human resources officer Violence: Not At Risk (01/31/2022)   Humiliation, Afraid, Rape, and Kick questionnaire    Fear of Current or Ex-Partner: No    Emotionally  Abused: No    Physically Abused: No    Sexually Abused: No    Outpatient Medications Prior to Visit  Medication Sig Dispense Refill   acetaminophen (TYLENOL) 650 MG CR tablet Take 1,300 mg by mouth every 8 (eight) hours as needed for pain (pain).     benzonatate (TESSALON) 200 MG capsule Take 1 capsule (200 mg total) by mouth 3 (three) times daily as needed for cough. 15 capsule 0   buPROPion (WELLBUTRIN SR) 150 MG 12 hr tablet Take 150 mg by mouth 2 (two) times daily.     butalbital-acetaminophen-caffeine (FIORICET) 50-325-40 MG tablet TAKE 1 TABLET BY MOUTH EVERY 6 HOURS AS NEEDED FOR HEADACHE (Patient taking differently: Take 1 tablet by mouth every 6 (six) hours as needed for headache.) 30 tablet 4   fexofenadine (ALLEGRA) 180 MG tablet Take 180 mg by mouth daily as needed for allergies or rhinitis.     FLUoxetine (PROZAC) 20 MG capsule Take 1 tablet by mouth daily.     glipiZIDE (GLUCOTROL) 5 MG tablet TAKE 1 TABLET (5 MG TOTAL) BY MOUTH DAILY. 90 tablet 0   hydrOXYzine (VISTARIL) 25 MG capsule Take 25 mg by mouth daily as needed for anxiety.     irbesartan (AVAPRO) 75 MG tablet TAKE 1 TABLET BY MOUTH EVERY DAY 90 tablet 4   Lancet Devices MISC       loperamide (IMODIUM) 2 MG capsule Take 2-4 mg by mouth daily as needed for diarrhea or loose stools. (Patient not taking: Reported on 06/15/2022)     metFORMIN (GLUCOPHAGE-XR) 500 MG 24 hr tablet TAKE 2 TABLETS BY MOUTH EVERY DAY 180 tablet 3   metoprolol tartrate (LOPRESSOR) 50 MG tablet Take 1 tablet (50 mg total) by mouth 2 (two) times daily. 60 tablet 2   montelukast (SINGULAIR) 10 MG tablet Take 1 tablet (10 mg total) by mouth at bedtime as needed (allergies). Home med.     Na Sulfate-K Sulfate-Mg Sulf 17.5-3.13-1.6 GM/177ML SOLN USE AS DIRECTED (Patient not taking: Reported on 06/15/2022) 354 mL 0   Olopatadine HCl 0.7 % SOLN Place 1 drop into both eyes daily as needed (allergies).     ondansetron (ZOFRAN ODT) 4 MG disintegrating tablet Take 1 tablet (4 mg total) by mouth every 8 (eight) hours as needed for nausea or vomiting. 20 tablet 3   pregabalin (LYRICA) 75 MG capsule Take 1 capsule (75 mg total) by mouth 3 (three) times daily.     promethazine (PHENERGAN) 25 MG tablet Take 25 mg by mouth every 8 (eight) hours as needed.     RABEprazole (ACIPHEX) 20 MG tablet TAKE 1 TABLET BY MOUTH EVERY DAY 90 tablet 4   rosuvastatin (CRESTOR) 20 MG tablet Take 20 mg by mouth daily.     simethicone (MYLICON) 80 MG chewable tablet Chew 80 mg by mouth every 6 (six) hours as needed for flatulence.     traZODone (DESYREL) 50 MG tablet Take 50-100 mg by mouth at bedtime as needed for sleep.     valACYclovir (VALTREX) 1000 MG tablet TAKE 1 TABLET DAILY FOR 5 DAYS AS NEEDED 90 tablet 1   amoxicillin-clavulanate (AUGMENTIN) 875-125 MG tablet Take 1 tablet by mouth 2 (two) times daily. 14 tablet 0   ezetimibe (ZETIA) 10 MG tablet TAKE 1 TABLET BY MOUTH EVERYDAY AT BEDTIME (Patient taking differently: Take 10 mg by mouth at bedtime.) 90 tablet 0   apixaban (ELIQUIS) 5 MG TABS tablet Take 1 tablet (5 mg total) by mouth 2 (two)  times daily. 60 tablet 2   atorvastatin (LIPITOR) 80 MG tablet      sertraline (ZOLOFT) 100  MG tablet Take 2 tablets (200 mg total) by mouth daily.     No facility-administered medications prior to visit.    Allergies  Allergen Reactions   Doxycycline Other (See Comments)    Emotional changes/anxious   Biaxin [Clarithromycin]     GI upset, and bad taste in mouth.        Objective:    Physical Exam Constitutional:      General: She is not in acute distress.    Appearance: Normal appearance. She is not ill-appearing, toxic-appearing or diaphoretic.  HENT:     Right Ear: Tympanic membrane normal.     Left Ear: Tympanic membrane normal.     Nose: Nose normal. No congestion or rhinorrhea.     Right Turbinates: Not enlarged or swollen.     Left Turbinates: Not enlarged or swollen.     Right Sinus: No maxillary sinus tenderness or frontal sinus tenderness.     Left Sinus: No maxillary sinus tenderness or frontal sinus tenderness.     Mouth/Throat:     Mouth: Mucous membranes are moist.     Pharynx: No pharyngeal swelling, oropharyngeal exudate or posterior oropharyngeal erythema.     Tonsils: No tonsillar exudate.  Eyes:     Extraocular Movements: Extraocular movements intact.     Conjunctiva/sclera: Conjunctivae normal.     Pupils: Pupils are equal, round, and reactive to light.  Neck:     Thyroid: No thyroid mass.  Cardiovascular:     Rate and Rhythm: Normal rate and regular rhythm.     Heart sounds: Murmur heard.  Pulmonary:     Effort: Pulmonary effort is normal.     Breath sounds: Examination of the left-middle field reveals decreased breath sounds. Examination of the left-lower field reveals decreased breath sounds. Decreased breath sounds present.  Lymphadenopathy:     Cervical:     Right cervical: No superficial cervical adenopathy.    Left cervical: No superficial cervical adenopathy.  Neurological:     Mental Status: She is alert.      BP 132/68   Pulse (!) 56   Ht 5' (1.524 m)   Wt 136 lb (61.7 kg)   SpO2 95%   BMI 26.56 kg/m  Wt Readings  from Last 3 Encounters:  06/15/22 135 lb (61.2 kg)  06/15/22 136 lb (61.7 kg)  06/09/22 138 lb (62.6 kg)     Health Maintenance Due  Topic Date Due   FOOT EXAM  Never done   Zoster Vaccines- Shingrix (1 of 2) Never done   DEXA SCAN  Never done   DTaP/Tdap/Td (2 - Td or Tdap) 10/08/2017   COVID-19 Vaccine (3 - Pfizer risk series) 09/24/2019   COLONOSCOPY (Pts 45-71yr Insurance coverage will need to be confirmed)  11/16/2020   OPHTHALMOLOGY EXAM  06/30/2021    There are no preventive care reminders to display for this patient.  Lab Results  Component Value Date   TSH 1.51 03/08/2022   Lab Results  Component Value Date   WBC 7.3 06/15/2022   HGB 12.8 06/15/2022   HCT 37.3 06/15/2022   MCV 78.2 (L) 06/15/2022   PLT 168 06/15/2022   Lab Results  Component Value Date   NA 140 04/21/2022   K 4.1 04/21/2022   CO2 26 04/21/2022   GLUCOSE 144 (H) 04/21/2022   BUN 13 04/21/2022   CREATININE 0.58  04/21/2022   BILITOT 0.3 04/21/2022   ALKPHOS 84 04/21/2022   AST 44 (H) 04/21/2022   ALT 43 04/21/2022   PROT 6.6 04/21/2022   ALBUMIN 4.0 04/21/2022   CALCIUM 9.3 04/21/2022   ANIONGAP 8 04/21/2022   EGFR 75 02/09/2021   GFR 86.05 03/08/2022   Lab Results  Component Value Date   CHOL 87 09/21/2021   Lab Results  Component Value Date   HDL 42 09/21/2021   Lab Results  Component Value Date   LDLCALC 21 09/21/2021   Lab Results  Component Value Date   TRIG 122 09/21/2021   Lab Results  Component Value Date   CHOLHDL 2.1 09/21/2021   Lab Results  Component Value Date   HGBA1C 6.1 03/08/2022      Assessment & Plan:   Problem List Items Addressed This Visit       Respiratory   Bronchitis    Chest xray today to r/o pneumonia Take antibiotic as prescribed. Increase oral fluids. Pt to f/u if sx worsen and or fail to improve in 2-3 days. Start zpack 250 mg         Other   Breast pain, left    Chronic, await consult with breast surgeon.  Reviewed  mammogram, did recommend u/s breast if pt would like however declines as awaiting surgeon consult.  Pt advised to: Please monitor site for worsening signs/symptoms of infection to include: increasing redness, increasing tenderness, increase in size, and or pustulant drainage from site. If this is to occur please let me know immediately.         RESOLVED: Decreased breath sounds at left lung base   Relevant Orders   DG Chest 2 View (Completed)   Other Visit Diagnoses     Blood in stool    -  Primary   Heart murmur       Acute bronchitis with COPD ([place])       Relevant Medications   azithromycin (ZITHROMAX) 250 MG tablet   Acute midline low back pain without sciatica       Relevant Orders   Urine Culture (Completed)   POCT Urinalysis Dipstick (Automated)   Urinary urgency       Relevant Orders   Urine Culture (Completed)   POCT Urinalysis Dipstick (Automated)       Meds ordered this encounter  Medications   ezetimibe (ZETIA) 10 MG tablet    Sig: Take 1 tablet (10 mg total) by mouth at bedtime.    Order Specific Question:   Supervising Provider    Answer:   Diona Browner, AMY E [[id number]]   azithromycin (ZITHROMAX) 250 MG tablet    Sig: Take 2 tablets on day 1, then 1 tablet daily on days 2 through 5    Dispense:  6 tablet    Refill:  0    Order Specific Question:   Supervising Provider    Answer:   Diona Browner, AMY E [[id number]]    Follow-up: Return in about 3 months (around 09/14/2022).    Eugenia Pancoast, FNP

## 2022-06-15 NOTE — Progress Notes (Signed)
Patient treated for an upper respiratory infection. Tx by pcp- who switched patient from Augmentin to zpack. Pt denies any fever a the present time. She tested negative for covid and flu. She has not tested for RSV.  She is being evaluated today for +occult blood. She reports severe fatigue and nausea. She last took zofran last evening.  She has a colonoscopy planned with Dr. Vicente Males on 06/21/22.

## 2022-06-16 LAB — URINE CULTURE
MICRO NUMBER:: 14388454
Result:: NO GROWTH
SPECIMEN QUALITY:: ADEQUATE

## 2022-06-19 ENCOUNTER — Other Ambulatory Visit: Payer: Self-pay | Admitting: Primary Care

## 2022-06-19 DIAGNOSIS — R051 Acute cough: Secondary | ICD-10-CM

## 2022-06-19 NOTE — Assessment & Plan Note (Signed)
Chronic, await consult with breast surgeon.  Reviewed mammogram, did recommend u/s breast if pt would like however declines as awaiting surgeon consult.  Pt advised to: Please monitor site for worsening signs/symptoms of infection to include: increasing redness, increasing tenderness, increase in size, and or pustulant drainage from site. If this is to occur please let me know immediately.

## 2022-06-19 NOTE — Assessment & Plan Note (Signed)
Chest xray today to r/o pneumonia Take antibiotic as prescribed. Increase oral fluids. Pt to f/u if sx worsen and or fail to improve in 2-3 days. Start zpack 250 mg

## 2022-06-21 ENCOUNTER — Encounter
Admission: RE | Disposition: A | Discharge status: Home / Self Care | Payer: Self-pay | Source: Home / Self Care | Attending: Gastroenterology

## 2022-06-21 ENCOUNTER — Ambulatory Visit: Payer: PPO | Admitting: Certified Registered"

## 2022-06-21 ENCOUNTER — Encounter: Payer: Self-pay | Admitting: Gastroenterology

## 2022-06-21 ENCOUNTER — Other Ambulatory Visit: Payer: Self-pay

## 2022-06-21 ENCOUNTER — Ambulatory Visit
Admission: RE | Admit: 2022-06-21 | Discharge: 2022-06-21 | Disposition: A | Discharge status: Home / Self Care | Payer: PPO | Attending: Gastroenterology | Admitting: Gastroenterology

## 2022-06-21 DIAGNOSIS — Z953 Presence of xenogenic heart valve: Secondary | ICD-10-CM | POA: Diagnosis not present

## 2022-06-21 DIAGNOSIS — K621 Rectal polyp: Secondary | ICD-10-CM | POA: Diagnosis not present

## 2022-06-21 DIAGNOSIS — F419 Anxiety disorder, unspecified: Secondary | ICD-10-CM | POA: Diagnosis not present

## 2022-06-21 DIAGNOSIS — K219 Gastro-esophageal reflux disease without esophagitis: Secondary | ICD-10-CM | POA: Diagnosis not present

## 2022-06-21 DIAGNOSIS — I509 Heart failure, unspecified: Secondary | ICD-10-CM | POA: Insufficient documentation

## 2022-06-21 DIAGNOSIS — J45909 Unspecified asthma, uncomplicated: Secondary | ICD-10-CM | POA: Insufficient documentation

## 2022-06-21 DIAGNOSIS — Z85828 Personal history of other malignant neoplasm of skin: Secondary | ICD-10-CM | POA: Insufficient documentation

## 2022-06-21 DIAGNOSIS — D128 Benign neoplasm of rectum: Secondary | ICD-10-CM | POA: Diagnosis not present

## 2022-06-21 DIAGNOSIS — Z1211 Encounter for screening for malignant neoplasm of colon: Secondary | ICD-10-CM | POA: Insufficient documentation

## 2022-06-21 DIAGNOSIS — D126 Benign neoplasm of colon, unspecified: Secondary | ICD-10-CM | POA: Diagnosis not present

## 2022-06-21 DIAGNOSIS — I739 Peripheral vascular disease, unspecified: Secondary | ICD-10-CM | POA: Insufficient documentation

## 2022-06-21 DIAGNOSIS — Z860100 Personal history of colon polyps, unspecified: Secondary | ICD-10-CM

## 2022-06-21 DIAGNOSIS — Z951 Presence of aortocoronary bypass graft: Secondary | ICD-10-CM | POA: Insufficient documentation

## 2022-06-21 DIAGNOSIS — I110 Hypertensive heart disease with heart failure: Secondary | ICD-10-CM | POA: Diagnosis not present

## 2022-06-21 HISTORY — PX: COLONOSCOPY WITH PROPOFOL: SHX5780

## 2022-06-21 LAB — GLUCOSE, CAPILLARY: Glucose-Capillary: 262 mg/dL — ABNORMAL HIGH (ref 70–99)

## 2022-06-21 SURGERY — COLONOSCOPY WITH PROPOFOL
Anesthesia: General

## 2022-06-21 MED ORDER — STERILE WATER FOR IRRIGATION IR SOLN
Status: DC | PRN
  Administered 2022-06-21 (×2): 60 mL

## 2022-06-21 MED ORDER — SODIUM CHLORIDE 0.9 % IV SOLN: INTRAVENOUS | Status: DC

## 2022-06-21 MED ORDER — PROPOFOL 500 MG/50ML IV EMUL
INTRAVENOUS | Status: DC | PRN
  Administered 2022-06-21: 150 ug/kg/min via INTRAVENOUS

## 2022-06-21 MED ORDER — LIDOCAINE HCL (CARDIAC) PF 100 MG/5ML IV SOSY
PREFILLED_SYRINGE | INTRAVENOUS | Status: DC | PRN
  Administered 2022-06-21: 20 mg via INTRAVENOUS

## 2022-06-21 MED ORDER — PROPOFOL 10 MG/ML IV BOLUS
INTRAVENOUS | Status: DC | PRN
  Administered 2022-06-21 (×2): 40 mg via INTRAVENOUS

## 2022-06-21 NOTE — Anesthesia Postprocedure Evaluation (Signed)
Anesthesia Post Note  Patient: Katie Buckley  Procedure(s) Performed: COLONOSCOPY WITH PROPOFOL  Patient location during evaluation: Endoscopy Anesthesia Type: General Level of consciousness: awake and alert Pain management: pain level controlled Vital Signs Assessment: post-procedure vital signs reviewed and stable Respiratory status: spontaneous breathing, nonlabored ventilation, respiratory function stable and patient connected to nasal cannula oxygen Cardiovascular status: blood pressure returned to baseline and stable Postop Assessment: no apparent nausea or vomiting Anesthetic complications: no  No notable events documented.   Last Vitals:  Vitals:   06/21/22 1025 06/21/22 1035  BP: (!) 128/59 (!) 132/51  Pulse: (!) 59 61  Resp: 20 (!) 27  Temp:    SpO2: 98% 98%    Last Pain:  Vitals:   06/21/22 1035  TempSrc:   PainSc: 0-No pain                 [name]

## 2022-06-21 NOTE — Op Note (Signed)
[facility] Gastroenterology Patient Name: [place] Procedure Date: 06/21/2022 9:50 AM MRN: [id number] Account #: [id number] Date of Birth: September 10, 1950 Admit Type: Outpatient Age: 72 Room: [facility] ENDO ROOM 3 Gender: Female Note Status: Finalized Instrument Name: Jasper Riling [id number] Procedure:             Colonoscopy Indications:           Surveillance: Personal history of adenomatous polyps                         on last colonoscopy > 5 years ago Providers:             Jonathon Bellows MD, MD Medicines:             Monitored Anesthesia Care Complications:         No immediate complications. Procedure:             Pre-Anesthesia Assessment:                        - Prior to the procedure, a History and Physical was                         performed, and patient medications, allergies and                         sensitivities were reviewed. The patient's tolerance                         of previous anesthesia was reviewed.                        - The risks and benefits of the procedure and the                         sedation options and risks were discussed with the                         patient. All questions were answered and informed                         consent was obtained.                        - ASA Grade Assessment: II - A patient with mild                         systemic disease.                        After obtaining informed consent, the colonoscope was                         passed under direct vision. Throughout the procedure,                         the patient's blood pressure, pulse, and oxygen                         saturations were monitored continuously. The  Colonoscope was introduced through the anus and                         advanced to the the cecum, identified by the                         appendiceal orifice. The colonoscopy was performed                         with ease. The patient tolerated the  procedure well.                         The quality of the bowel preparation was excellent.                         The ileocecal valve, appendiceal orifice, and rectum                         were photographed. Findings:      The perianal and digital rectal examinations were normal.      A 5 mm polyp was found in the rectum. The polyp was sessile. The polyp       was removed with a cold snare. Resection and retrieval were complete.      The exam was otherwise without abnormality on direct and retroflexion       views. Impression:            - One 5 mm polyp in the rectum, removed with a cold                         snare. Resected and retrieved.                        - The examination was otherwise normal on direct and                         retroflexion views. Recommendation:        - Discharge patient to home (with escort).                        - Resume previous diet.                        - Continue present medications.                        - Await pathology results.                        - Repeat colonoscopy for surveillance based on                         pathology results. Procedure Code(s):     --- Professional ---                        [phone], Colonoscopy, flexible; with removal of                         tumor(s), polyp(s), or other lesion(s) by snare  technique Diagnosis Code(s):     --- Professional ---                        Z86.010, Personal history of colonic polyps                        D12.8, Benign neoplasm of rectum CPT copyright 2022 American Medical Association. All rights reserved. The codes documented in this report are preliminary and upon coder review may  be revised to meet current compliance requirements. Jonathon Bellows, MD Jonathon Bellows MD, MD 06/21/2022 10:14:10 AM This report has been signed electronically. Number of Addenda: 0 Note Initiated On: 06/21/2022 9:50 AM Scope Withdrawal Time: 0 hours 8 minutes 55 seconds  Total  Procedure Duration: 0 hours 11 minutes 18 seconds  Estimated Blood Loss:  Estimated blood loss: none.      [facility]

## 2022-06-21 NOTE — Transfer of Care (Signed)
Immediate Anesthesia Transfer of Care Note  Patient: Katie Buckley  Procedure(s) Performed: COLONOSCOPY WITH PROPOFOL  Patient Location: PACU  Anesthesia Type:General  Level of Consciousness: drowsy  Airway & Oxygen Therapy: Patient Spontanous Breathing and Patient connected to face mask oxygen  Post-op Assessment: Report given to RN and Post -op Vital signs reviewed and stable  Post vital signs: Reviewed and stable  Last Vitals:  Vitals Value Taken Time  BP    Temp    Pulse    Resp    SpO2      Last Pain:  Vitals:   06/21/22 1015  TempSrc: Temporal  PainSc: Asleep         Complications: No notable events documented.

## 2022-06-21 NOTE — Anesthesia Preprocedure Evaluation (Addendum)
Anesthesia Evaluation  Patient identified by MRN, date of birth, ID band Patient awake    Reviewed: Allergy & Precautions, NPO status , Patient's Chart, lab work & pertinent test results  History of Anesthesia Complications Negative for: history of anesthetic complications  Airway Mallampati: III  TM Distance: <3 FB Neck ROM: full    Dental  (+) Chipped, Poor Dentition   Pulmonary neg shortness of breath, asthma    Pulmonary exam normal        Cardiovascular hypertension, (-) angina + CABG, + Peripheral Vascular Disease, +CHF and + DOE  Normal cardiovascular exam+ Valvular Problems/Murmurs   S/p TAVR   Neuro/Psych  Headaches PSYCHIATRIC DISORDERS       Neuromuscular disease    GI/Hepatic Neg liver ROS,GERD  Controlled,,  Endo/Other  diabetes, Type 2    Renal/GU negative Renal ROS  negative genitourinary   Musculoskeletal   Abdominal   Peds  Hematology negative hematology ROS (+)   Anesthesia Other Findings Past Medical History: No date: Anxiety 01/21/2013: Aortic stenosis     Comment:  s/p bioprosthetic TAVR 10/2021 No date: Frequent sinus infections No date: Headache     Comment:  Tension headaches No date: History of Barrett's esophagus No date: Hyperchloremia 2018: MRSA (methicillin resistant staph aureus) culture positive     Comment:  history of, in hand No date: Palpitations No date: Seasonal allergies No date: Skin cancer     Comment:  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  Past Surgical History: 11/15/2020: ANTERIOR CERVICAL DECOMP/DISCECTOMY FUSION; N/A     Comment:  Procedure: ANTERIOR CERVICAL DECOMPRESSION/DISCECTOMY               FUSION 1 LEVEL C3/4;  Surgeon: Deetta Perla, MD;                Location: [facility];  Service: Neurosurgery;  Laterality:               N/A; 09/19/2021: AORTIC VALVE REPLACEMENT; N/A     Comment:  Procedure: AORTIC VALVE REPLACEMENT Using 39m Edwards                Resilia Inspiris Valve;  Surgeon: BGaye Pollack MD;                [place]  Service: Open Heart Surgery;                Laterality: N/A; No date: AUGMENTATION MAMMAPLASTY; Bilateral     Comment:  breast implants 2007: Bone Spur     Comment:  foot No date: CARDIAC CATHETERIZATION 2008: Carpal Tunnel Symdrome     Comment:  as repeated in 2009 01/16/2019: CATARACT EXTRACTION W/PHACO; Left     Comment:  Procedure: CATARACT EXTRACTION PHACO AND INTRAOCULAR               LENS PLACEMENT ([place] LEFT DIABETES VISION BLUE;  Surgeon:              HMarchia Meiers MD;  Location: [facility];  Service:               Ophthalmology;  Laterality: Left;  [place] 00:47 CDE               6.62 Fluid pack lot # [id number] 02/06/2019: CATARACT EXTRACTION W/PHACO; Right     Comment:  Procedure: CATARACT EXTRACTION PHACO AND INTRAOCULAR               LENS PLACEMENT (IOC);  Surgeon: Marchia Meiers, MD;                Location: [facility];  Service: Ophthalmology;  Laterality:              Right;  [place] 00:54.6 CDE 6.52 FLUID PACK LOT # [id number] 2007: CHOLECYSTECTOMY 11/17/2015: COLONOSCOPY WITH PROPOFOL; N/A     Comment:  Procedure: COLONOSCOPY WITH PROPOFOL;  Surgeon: Manya Silvas, MD;  Location[facility] ENDOSCOPY;  Service:               Endoscopy;  Laterality: N/A; 09/19/2021: CORONARY ARTERY BYPASS GRAFT; N/A     Comment:  Procedure: CORONARY ARTERY BYPASS GRAFTING X1 Using Left              Internal Mammory artery;  Surgeon: Gaye Pollack, MD;                Location: [place];  Service: Open Heart Surgery;                Laterality: N/A; 11/17/2015: ESOPHAGOGASTRODUODENOSCOPY (EGD) WITH PROPOFOL; N/A     Comment:  Procedure: ESOPHAGOGASTRODUODENOSCOPY (EGD) WITH               PROPOFOL;  Surgeon: Manya Silvas, MD;  Location: [facility]              ENDOSCOPY;  Service: Endoscopy;  Laterality: N/A; 05/24/2021: INTRAVASCULAR PRESSURE WIRE/FFR STUDY; N/A     Comment:  Procedure: INTRAVASCULAR  PRESSURE WIRE/FFR STUDY;                Surgeon: Yolonda Kida, MD;  Location: [place]              CV LAB;  Service: Cardiovascular;  Laterality: N/A; No date: MANDIBLE SURGERY 03/2013: NASAL SINUS SURGERY     Comment:  Multile surgery Dr. Carlis Abbott. DX eosinophilic sinusitis               04-2013 10/2010: Release of Trigger Finger; Right     Comment:  Dr. Tamala Julian 05/24/2021: RIGHT/LEFT HEART CATH AND CORONARY ANGIOGRAPHY; N/A     Comment:  Procedure: RIGHT/LEFT HEART CATH AND CORONARY               ANGIOGRAPHY;  Surgeon: Yolonda Kida, MD;  Location:              [place] CV LAB;  Service: Cardiovascular;                Laterality: N/A; 09/19/2021: TEE WITHOUT CARDIOVERSION; N/A     Comment:  Procedure: TRANSESOPHAGEAL ECHOCARDIOGRAM (TEE);                Surgeon: Gaye Pollack, MD;  Location: [place];  Service:              Open Heart Surgery;  Laterality: N/A; 1960: TONSILLECTOMY     Reproductive/Obstetrics negative OB ROS                             Anesthesia Physical Anesthesia Plan  ASA: 3  Anesthesia Plan: General   Post-op Pain Management:    Induction: Intravenous  PONV Risk Score and Plan: Propofol infusion and TIVA  Airway Management Planned: Natural Airway and Nasal Cannula  Additional Equipment:   Intra-op Plan:   Post-operative Plan:   Informed Consent: I  have reviewed the patients History and Physical, chart, labs and discussed the procedure including the risks, benefits and alternatives for the proposed anesthesia with the patient or authorized representative who has indicated his/her understanding and acceptance.     Dental Advisory Given  Plan Discussed with: Anesthesiologist, CRNA and Surgeon  Anesthesia Plan Comments: (Patient consented for risks of anesthesia including but not limited to:  - adverse reactions to medications - risk of airway placement if required - damage to eyes, teeth, lips or  other oral mucosa - nerve damage due to positioning  - sore throat or hoarseness - Damage to heart, brain, nerves, lungs, other parts of body or loss of life  Patient voiced understanding.)        Anesthesia Quick Evaluation

## 2022-06-21 NOTE — H&P (Signed)
Katie Bellows, MD [address] [place], [place], [place], [id number], [place], [place], [place], [address] Phone: [phone]  Fax: [phone]  Primary Care Physician:  Eugenia Pancoast, FNP   Pre-Procedure History & Physical: HPI:  K[place] is a 72 y.o. female is here for an colonoscopy.   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    Past Surgical History:  Procedure Laterality Date   ANTERIOR CERVICAL DECOMP/DISCECTOMY FUSION N/A 11/15/2020   Procedure: ANTERIOR CERVICAL DECOMPRESSION/DISCECTOMY FUSION 1 LEVEL C3/4;  Surgeon: Deetta Perla, MD;  Location: [facility];  Service: Neurosurgery;  Laterality: N/A;   AORTIC VALVE REPLACEMENT N/A 09/19/2021   Procedure: AORTIC VALVE REPLACEMENT Using 76m Edwards Resilia Inspiris Valve;  Surgeon: BGaye Pollack MD;  [place]  Service: Open Heart Surgery;  Laterality: N/A;   AUGMENTATION MAMMAPLASTY Bilateral    breast implants   Bone Spur  2007   foot   CARDIAC CATHETERIZATION     Carpal Tunnel Symdrome  2008   as repeated in 2009   CATARACT EXTRACTION W/PHACO Left 01/16/2019   Procedure: CATARACT EXTRACTION PHACO AND INTRAOCULAR LENS PLACEMENT ([place] LEFT DIABETES VISION BLUE;  Surgeon: HMarchia Meiers MD;  Location: [facility];  Service: Ophthalmology;  Laterality: Left;  [place] 00:47 CDE 6.62 Fluid pack lot # [id number]   CATARACT EXTRACTION W/PHACO Right 02/06/2019   Procedure: CATARACT EXTRACTION PHACO AND INTRAOCULAR LENS PLACEMENT (IOC);  Surgeon: HMarchia Meiers  MD;  Location: [facility];  Service: Ophthalmology;  Laterality: Right;  [place]00:54.6 CDE 6.52 FLUID PACK LOT # [id number]   CHOLECYSTECTOMY  2007   COLONOSCOPY WITH PROPOFOL N/A 11/17/2015   Procedure: COLONOSCOPY WITH PROPOFOL;  Surgeon: RManya Silvas MD;  [facility]  Service: Endoscopy;  Laterality: N/A;   CORONARY ARTERY BYPASS GRAFT N/A 09/19/2021   Procedure: CORONARY ARTERY BYPASS GRAFTING X1 Using Left Internal Mammory artery;  Surgeon: BGaye Pollack MD;  [place]  Service: Open Heart Surgery;  Laterality: N/A;   ESOPHAGOGASTRODUODENOSCOPY (EGD) WITH PROPOFOL N/A 11/17/2015   Procedure: ESOPHAGOGASTRODUODENOSCOPY (EGD) WITH PROPOFOL;  Surgeon: RManya Silvas MD;  [facility]  Service: Endoscopy;  Laterality: N/A;   INTRAVASCULAR PRESSURE WIRE/FFR STUDY N/A 05/24/2021   Procedure: INTRAVASCULAR PRESSURE WIRE/FFR STUDY;  Surgeon: CYolonda Kida MD;  [place];  Service: Cardiovascular;  Laterality: N/A;   MANDIBLE SURGERY     NASAL SINUS SURGERY  03/2013   Multile surgery Dr. CCarlis Abbott DX eosinophilic sinusitis [phone]  Release of Trigger Finger Right 10/2010   Dr. STamala Julian  RIGHT/LEFT HEART CATH AND CORONARY ANGIOGRAPHY N/A 05/24/2021   Procedure: RIGHT/LEFT HEART CATH AND CORONARY ANGIOGRAPHY;  Surgeon: CYolonda Kida MD;  [place] [place];  Service: Cardiovascular;  Laterality: N/A;   TEE WITHOUT CARDIOVERSION N/A 09/19/2021   Procedure: TRANSESOPHAGEAL ECHOCARDIOGRAM (TEE);  Surgeon: BGaye Pollack MD;  [place]  Service: Open Heart Surgery;  Laterality: N/A;   TONSILLECTOMY  1960    Prior to Admission medications   Medication Sig Start Date End Date Taking? Authorizing Provider  buPROPion (WELLBUTRIN SR) 150 MG 12 hr tablet Take 150 mg by mouth 2 (two) times daily. 05/25/20  Yes [provider]  butalbital-acetaminophen-caffeine (FIORICET) 50-325-40 MG tablet TAKE 1 TABLET BY  MOUTH EVERY 6 HOURS AS NEEDED FOR HEADACHE Patient taking differently: Take 1 tablet by mouth every 6 (six) hours as needed for headache. 05/23/21  Yes [name], MD  ezetimibe (ZETIA) 10 MG tablet Take 1 tablet (10 mg total) by mouth at bedtime. 06/15/22  Yes Dugal, Lawerance Bach, FNP  fexofenadine (ALLEGRA) 180 MG tablet Take 180 mg by mouth daily as needed for allergies or rhinitis.   Yes [provider]  FLUoxetine (PROZAC) 20 MG capsule Take 1 tablet by mouth daily.   Yes [provider]  hydrOXYzine (VISTARIL) 25 MG capsule Take 25 mg by mouth daily as needed for anxiety. 10/05/20  Yes [provider]  irbesartan (AVAPRO) 75 MG tablet TAKE 1 TABLET BY MOUTH EVERY DAY 12/25/21  Yes [name], MD  metoprolol tartrate (LOPRESSOR) 50 MG tablet Take 1 tablet (50 mg total) by mouth 2 (two) times daily. 09/24/21  Yes Tacy Dura, Donielle M, PA-C  montelukast (SINGULAIR) 10 MG tablet Take 1 tablet (10 mg total) by mouth at bedtime as needed (allergies). Home med. 11/02/21  Yes Enzo Bi, MD  pregabalin (LYRICA) 75 MG capsule Take 1 capsule (75 mg total) by mouth 3 (three) times daily. 04/19/22  Yes Dugal, Lawerance Bach, FNP  RABEprazole (ACIPHEX) 20 MG tablet TAKE 1 TABLET BY MOUTH EVERY DAY 02/03/22  Yes [name], MD  rosuvastatin (CRESTOR) 20 MG tablet Take 20 mg by mouth daily. 05/29/22  Yes [provider]  acetaminophen (TYLENOL) 650 MG CR tablet Take 1,300 mg by mouth every 8 (eight) hours as needed for pain (pain).    [provider]  apixaban (ELIQUIS) 5 MG TABS tablet Take 1 tablet (5 mg total) by mouth 2 (two) times daily. 11/02/21 06/15/22  Enzo Bi, MD  benzonatate (TESSALON) 200 MG capsule TAKE 1 CAPSULE (200 MG TOTAL) BY MOUTH 3 (THREE) TIMES DAILY AS NEEDED FOR COUGH. 06/20/22   Dugal, Lawerance Bach, FNP  glipiZIDE (GLUCOTROL) 5 MG tablet TAKE 1 TABLET (5 MG TOTAL) BY MOUTH DAILY. 06/13/22 09/11/22  Eugenia Pancoast, FNP  Lancet Devices MISC     [provider]  loperamide (IMODIUM) 2 MG capsule Take 2-4 mg by mouth daily as needed for diarrhea or loose stools. Patient not taking: Reported on 06/15/2022    [provider]  metFORMIN (GLUCOPHAGE-XR) 500 MG 24 hr tablet TAKE 2 TABLETS BY MOUTH EVERY DAY 11/07/21   [name], MD  Na Sulfate-K Sulfate-Mg Sulf 17.5-3.13-1.6 GM/177ML SOLN USE AS DIRECTED Patient not taking: Reported on 06/15/2022 06/14/22   Katie Bellows, MD  Olopatadine HCl 0.7 % SOLN Place 1 drop into both eyes daily as needed (allergies).    [provider]  ondansetron (ZOFRAN ODT) 4 MG disintegrating tablet Take 1 tablet (4 mg total) by mouth every 8 (eight) hours as needed for nausea or vomiting. 03/29/22   [name], MD  promethazine (PHENERGAN) 25 MG tablet Take 25 mg by mouth every 8 (eight) hours as needed. 10/25/21   [provider]  simethicone (MYLICON) 80 MG chewable tablet Chew 80 mg by mouth every 6 (six) hours  as needed for flatulence.    [provider]  traZODone (DESYREL) 50 MG tablet Take 50-100 mg by mouth at bedtime as needed for sleep. 04/10/22   [provider]  valACYclovir (VALTREX) 1000 MG tablet TAKE 1 TABLET DAILY FOR 5 DAYS AS NEEDED Patient not taking: Reported on 06/21/2022 04/30/22   [name], MD    Allergies as of 06/01/2022 - Review Complete 05/28/2022  Allergen Reaction Noted   Doxycycline Other (See Comments) 01/08/2018   Biaxin [clarithromycin]  12/16/2014    Family History  Problem Relation Age of Onset   Lung cancer Mother    Diabetes Father    Irritable bowel syndrome Sister    Throat cancer Brother    Breast cancer Daughter    Breast cancer Maternal Aunt     Social History   Socioeconomic History   Marital status: Married    Spouse name: [name], husband   Number of children: 1   Years of education: Not on file   Highest education level: Not on file  Occupational History    Comment: retired    Fish farm manager: RETIRED   Tobacco Use   Smoking status: Never   Smokeless tobacco: Never  Vaping Use   Vaping Use: Never used  Substance and Sexual Activity   Alcohol use: No   Drug use: No   Sexual activity: Not Currently    Partners: Male    Birth control/protection: Post-menopausal  Other Topics Concern   Not on file  Social History Narrative   Not on file   Social Determinants of Health   Financial Resource Strain: Low Risk  (01/31/2022)   Overall Financial Resource Strain (CARDIA)    Difficulty of Paying Living Expenses: Not hard at all  Food Insecurity: No Food Insecurity (01/31/2022)   Hunger Vital Sign    Worried About Running Out of Food in the Last Year: Never true    [place] in the Last Year: Never true  Transportation Needs: No Transportation Needs (01/31/2022)   PRAPARE - Transportation    Lack of Transportation (Medical): No    Lack of Transportation (Non-Medical): No  Physical Activity: Inactive (01/31/2022)   Exercise Vital Sign    Days of Exercise per Week: 0 days    Minutes of Exercise per Session: 0 min  Stress: Stress Concern Present (01/31/2022)   [place]    Feeling of Stress : To some extent  Social Connections: Moderately Isolated (01/31/2022)   Social Connection and Isolation Panel [NHANES]    Frequency of Communication with Friends and Family: Twice a week    Frequency of Social Gatherings with Friends and Family: Once a week    Attends Religious Services: Never    Marine scientist or Organizations: No    Attends Archivist Meetings: Never    Marital Status: Married  Human resources officer Violence: Not At Risk (01/31/2022)   Humiliation, Afraid, Rape, and Kick questionnaire    Fear of Current or Ex-Partner: No    Emotionally Abused: No    Physically Abused: No    Sexually Abused: No    Review of Systems: See HPI, otherwise negative ROS  Physical Exam: BP (!) 146/61   Pulse 66    Temp (!) 97.2 F (36.2 C) (Temporal)   Resp 18   Ht 5' (1.524 m)   Wt 59.9 kg   SpO2 96%   BMI 25.78 kg/m  General:  Alert,  pleasant and cooperative in NAD Head:  Normocephalic and atraumatic. Neck:  Supple; no masses or thyromegaly. Lungs:  Clear throughout to auscultation, normal respiratory effort.    Heart:  +S1, +S2, Regular rate and rhythm, No edema. Abdomen:  Soft, nontender and nondistended. Normal bowel sounds, without guarding, and without rebound.   Neurologic:  Alert and  oriented x4;  grossly normal neurologically.  Impression/Plan: [place] is here for an colonoscopy to be performed for surveillance due to prior history of colon polyps   Risks, benefits, limitations, and alternatives regarding  colonoscopy have been reviewed with the patient.  Questions have been answered.  All parties agreeable.   Katie Bellows, MD  06/21/2022, 9:54 AM

## 2022-06-22 ENCOUNTER — Encounter: Payer: Self-pay | Admitting: Gastroenterology

## 2022-06-22 LAB — SURGICAL PATHOLOGY

## 2022-06-22 NOTE — Progress Notes (Signed)
Noted  

## 2022-06-23 DIAGNOSIS — F411 Generalized anxiety disorder: Secondary | ICD-10-CM | POA: Diagnosis not present

## 2022-06-23 DIAGNOSIS — F331 Major depressive disorder, recurrent, moderate: Secondary | ICD-10-CM | POA: Diagnosis not present

## 2022-06-26 ENCOUNTER — Other Ambulatory Visit: Payer: Self-pay | Admitting: Family

## 2022-06-26 ENCOUNTER — Encounter: Payer: Self-pay | Admitting: Family

## 2022-06-26 DIAGNOSIS — B351 Tinea unguium: Secondary | ICD-10-CM

## 2022-06-26 MED ORDER — CICLOPIROX 8 % EX SOLN: Freq: Every day | CUTANEOUS | 0 refills | Status: DC

## 2022-06-26 NOTE — Progress Notes (Signed)
pnlac

## 2022-06-28 ENCOUNTER — Encounter: Payer: Self-pay | Admitting: Surgery

## 2022-07-02 ENCOUNTER — Other Ambulatory Visit: Payer: Self-pay | Admitting: Family

## 2022-07-02 ENCOUNTER — Other Ambulatory Visit: Payer: Self-pay | Admitting: Family Medicine

## 2022-07-02 DIAGNOSIS — B351 Tinea unguium: Secondary | ICD-10-CM

## 2022-07-03 NOTE — Telephone Encounter (Signed)
Requested medication (s) are due for refill today: yes  Requested medication (s) are on the active medication list: yes  Last refill:  05/23/21 #30/4  Future visit scheduled: no  Notes to clinic:  Unable to refill per protocol, cannot delegate.    Requested Prescriptions  Pending Prescriptions Disp Refills   butalbital-acetaminophen-caffeine (FIORICET) 50-325-40 MG tablet [Pharmacy Med Name: BUTALB-ACETAMIN-CAFF 50-325-40] 30 tablet 4    Sig: TAKE 1 TABLET BY MOUTH EVERY 6 HOURS AS NEEDED FOR HEADACHE     Not Delegated - Analgesics:  Non-Opioid Analgesic Combinations 2 Failed - 07/02/2022 10:02 PM      Failed - This refill cannot be delegated      Passed - Cr in normal range and within 360 days    Creat  Date Value Ref Range Status  05/01/2017 0.61 0.50 - 0.99 mg/dL Final    Comment:    For patients >73 years of age, the reference limit for Creatinine is approximately 13% higher for people identified as African-American. .    Creatinine, Ser  Date Value Ref Range Status  04/21/2022 0.58 0.44 - 1.00 mg/dL Final   Creatinine,U  Date Value Ref Range Status  03/08/2022 68.7 mg/dL Final         Passed - eGFR is 10 or above and within 360 days    GFR, Est African American  Date Value Ref Range Status  05/01/2017 110 > OR = 60 mL/min/1.10m Final   GFR calc Af Amer  Date Value Ref Range Status  11/19/2019 89 >59 mL/min/1.73 Final    Comment:    **[facility] currently reports eGFR in compliance with the current**   recommendations of the [employer] [facility] will   update reporting as new guidelines are published from the NKF-ASN   Task force.    GFR, Est Non African American  Date Value Ref Range Status  05/01/2017 95 > OR = 60 mL/min/1.750mFinal   GFR, Estimated  Date Value Ref Range Status  04/21/2022 >60 >60 mL/min Final    Comment:    (NOTE) Calculated using the CKD-EPI Creatinine Equation (2021)    GFR  Date Value Ref Range Status   03/08/2022 86.05 >60.00 mL/min Final    Comment:    Calculated using the CKD-EPI Creatinine Equation (2021)   eGFR  Date Value Ref Range Status  02/09/2021 75 >59 mL/min/1.73 Final         Passed - Patient is not pregnant      Passed - Valid encounter within last 12 months    Recent Outpatient Visits           7 months ago Pneumonia due to infectious organism, unspecified laterality, unspecified part of lung   [facility]   8 months ago Nausea   [facility]   9 months ago Status post aortic valve replacement with tissue valve   [place]   1 year ago Type 2 diabetes mellitus with diabetic neuropathy, without long-term current use of insulin [facility]  [facility]   1 year ago Uncontrolled type 2 diabetes mellitus with diabetic neuropathy, without long-term current use of insulin ([facility]  [place] [facility] Section[facility]Kirst[facility] PeriMD

## 2022-07-05 DIAGNOSIS — R2689 Other abnormalities of gait and mobility: Secondary | ICD-10-CM | POA: Diagnosis not present

## 2022-07-05 DIAGNOSIS — G959 Disease of spinal cord, unspecified: Secondary | ICD-10-CM | POA: Diagnosis not present

## 2022-07-05 DIAGNOSIS — F959 Tic disorder, unspecified: Secondary | ICD-10-CM | POA: Diagnosis not present

## 2022-07-05 DIAGNOSIS — E1165 Type 2 diabetes mellitus with hyperglycemia: Secondary | ICD-10-CM | POA: Diagnosis not present

## 2022-07-05 DIAGNOSIS — M5481 Occipital neuralgia: Secondary | ICD-10-CM | POA: Diagnosis not present

## 2022-07-05 DIAGNOSIS — E1142 Type 2 diabetes mellitus with diabetic polyneuropathy: Secondary | ICD-10-CM | POA: Diagnosis not present

## 2022-07-07 DIAGNOSIS — F331 Major depressive disorder, recurrent, moderate: Secondary | ICD-10-CM | POA: Diagnosis not present

## 2022-07-07 DIAGNOSIS — F411 Generalized anxiety disorder: Secondary | ICD-10-CM | POA: Diagnosis not present

## 2022-07-14 ENCOUNTER — Telehealth: Payer: Self-pay | Admitting: Family

## 2022-07-14 NOTE — Telephone Encounter (Signed)
Andra from [employer] needs Clinical information related to medication ondansetron (ZOFRAN ODT) 4 MG disintegrating tablet for prior authorization.

## 2022-07-14 NOTE — Telephone Encounter (Signed)
Called and spoke to insurance. Did not have anything on patient file that medication needed any information or authorization.  No further action needed at this time.

## 2022-07-19 DIAGNOSIS — Z23 Encounter for immunization: Secondary | ICD-10-CM | POA: Diagnosis not present

## 2022-07-27 DIAGNOSIS — F331 Major depressive disorder, recurrent, moderate: Secondary | ICD-10-CM | POA: Diagnosis not present

## 2022-07-27 DIAGNOSIS — F411 Generalized anxiety disorder: Secondary | ICD-10-CM | POA: Diagnosis not present

## 2022-07-28 ENCOUNTER — Encounter: Payer: Self-pay | Admitting: Family

## 2022-07-28 DIAGNOSIS — G44229 Chronic tension-type headache, not intractable: Secondary | ICD-10-CM

## 2022-07-28 DIAGNOSIS — E782 Mixed hyperlipidemia: Secondary | ICD-10-CM

## 2022-07-28 DIAGNOSIS — G44209 Tension-type headache, unspecified, not intractable: Secondary | ICD-10-CM

## 2022-07-31 MED ORDER — EZETIMIBE 10 MG PO TABS: 10.0000 mg | ORAL_TABLET | Freq: Every day | ORAL | 3 refills | Status: DC

## 2022-07-31 MED ORDER — BUTALBITAL-APAP-CAFFEINE 50-325-40 MG PO TABS: 1.0000 | ORAL_TABLET | Freq: Four times a day (QID) | ORAL | 4 refills | Status: DC | PRN

## 2022-07-31 NOTE — Telephone Encounter (Signed)
Spoke with the patient and advised that the medication refills were sent in. She states her headaches are happening about a couple of times per week.

## 2022-08-09 DIAGNOSIS — R41840 Attention and concentration deficit: Secondary | ICD-10-CM | POA: Diagnosis not present

## 2022-08-09 DIAGNOSIS — G4700 Insomnia, unspecified: Secondary | ICD-10-CM | POA: Diagnosis not present

## 2022-08-09 DIAGNOSIS — F411 Generalized anxiety disorder: Secondary | ICD-10-CM | POA: Diagnosis not present

## 2022-08-09 DIAGNOSIS — F331 Major depressive disorder, recurrent, moderate: Secondary | ICD-10-CM | POA: Diagnosis not present

## 2022-08-14 NOTE — Telephone Encounter (Signed)
Please have pt get on my schedule:) Tell her its because I would like to test her for other things as well that have been very much going around.

## 2022-08-16 ENCOUNTER — Encounter: Payer: Self-pay | Admitting: Family

## 2022-08-16 ENCOUNTER — Ambulatory Visit (INDEPENDENT_AMBULATORY_CARE_PROVIDER_SITE_OTHER): Payer: PPO | Admitting: Family

## 2022-08-16 VITALS — BP 130/66 | HR 74 | Temp 97.8°F | Ht 60.0 in | Wt 139.8 lb

## 2022-08-16 DIAGNOSIS — Z20822 Contact with and (suspected) exposure to covid-19: Secondary | ICD-10-CM | POA: Diagnosis not present

## 2022-08-16 DIAGNOSIS — J0110 Acute frontal sinusitis, unspecified: Secondary | ICD-10-CM | POA: Diagnosis not present

## 2022-08-16 DIAGNOSIS — Z20828 Contact with and (suspected) exposure to other viral communicable diseases: Secondary | ICD-10-CM

## 2022-08-16 DIAGNOSIS — J029 Acute pharyngitis, unspecified: Secondary | ICD-10-CM | POA: Diagnosis not present

## 2022-08-16 DIAGNOSIS — J020 Streptococcal pharyngitis: Secondary | ICD-10-CM | POA: Diagnosis not present

## 2022-08-16 LAB — POC COVID19 BINAXNOW: SARS Coronavirus 2 Ag: NEGATIVE

## 2022-08-16 LAB — POCT INFLUENZA A/B
Influenza A, POC: NEGATIVE
Influenza B, POC: NEGATIVE

## 2022-08-16 LAB — POCT RAPID STREP A (OFFICE): Rapid Strep A Screen: POSITIVE — AB

## 2022-08-16 MED ORDER — AMOXICILLIN-POT CLAVULANATE 875-125 MG PO TABS: 1.0000 | ORAL_TABLET | Freq: Two times a day (BID) | ORAL | 0 refills | Status: AC

## 2022-08-16 NOTE — Patient Instructions (Signed)
------------------------------------    You were found to be strep positive,  Take antibiotics that have been sent to the pharmacy.  Change your toothbrush after 24 hours on the antibiotics.  Gargle with warm salt water as needed for sore throat.   ------------------------------------

## 2022-08-16 NOTE — Assessment & Plan Note (Signed)
Strep tested positive in office.  rx augmentin 875/125 mg po bid x 10 days Augmentin chosen over amox due to symptomatic as well for sinusitis Ibuprofen/tyelnol prn sore throat/fever Pt told to F/u if no improvement in the next 2-3 days.

## 2022-08-16 NOTE — Progress Notes (Signed)
Established Patient Office Visit  Subjective:   Patient ID: Katie Buckley, female    DOB: August 02, 1950  Age: 72 y.o. MRN: [id number]  CC:  Chief Complaint  Patient presents with   Cough    HPI:  is a 72 y.o. female presenting on 08/16/2022 for Cough   Cough    Here with c/o 1.5 weeks of symptoms. C/o cough , nonproductive, and sinus pressure and nasal congestion. Slight sore throat. Slight ear pain, bil feel pressure and fullness. Has not had a fever. Denies chest congestion, slight sob.   Otc medications for these symptoms coricidin with mild relief          ROS: Negative unless specifically indicated above in HPI.   Relevant past medical history reviewed and updated as indicated.   Allergies and medications reviewed and updated.   Current Outpatient Medications:    acetaminophen (TYLENOL) 650 MG CR tablet, Take 1,300 mg by mouth every 8 (eight) hours as needed for pain (pain)., Disp: , Rfl:    amoxicillin-clavulanate (AUGMENTIN) 875-125 MG tablet, Take 1 tablet by mouth 2 (two) times daily for 10 days., Disp: 20 tablet, Rfl: 0   apixaban (ELIQUIS) 5 MG TABS tablet, Take 1 tablet (5 mg total) by mouth 2 (two) times daily., Disp: 60 tablet, Rfl: 2   buPROPion (WELLBUTRIN SR) 150 MG 12 hr tablet, Take 150 mg by mouth 2 (two) times daily., Disp: , Rfl:    butalbital-acetaminophen-caffeine (FIORICET) 50-325-40 MG tablet, Take 1 tablet by mouth every 6 (six) hours as needed for headache., Disp: 30 tablet, Rfl: 4   ezetimibe (ZETIA) 10 MG tablet, Take 1 tablet (10 mg total) by mouth at bedtime., Disp: 90 tablet, Rfl: 3   fexofenadine (ALLEGRA) 180 MG tablet, Take 180 mg by mouth daily as needed for allergies or rhinitis., Disp: , Rfl:    FLUoxetine (PROZAC) 40 MG capsule, Take 40 mg by mouth daily., Disp: , Rfl:    glipiZIDE (GLUCOTROL) 5 MG tablet, TAKE 1 TABLET (5 MG TOTAL) BY MOUTH DAILY., Disp: 90 tablet, Rfl: 0   irbesartan (AVAPRO) 75 MG tablet, TAKE  1 TABLET BY MOUTH EVERY DAY, Disp: 90 tablet, Rfl: 4   Lancet Devices MISC, , Disp: , Rfl:    metFORMIN (GLUCOPHAGE-XR) 500 MG 24 hr tablet, TAKE 2 TABLETS BY MOUTH EVERY DAY, Disp: 180 tablet, Rfl: 3   metoprolol tartrate (LOPRESSOR) 50 MG tablet, Take 1 tablet (50 mg total) by mouth 2 (two) times daily., Disp: 60 tablet, Rfl: 2   montelukast (SINGULAIR) 10 MG tablet, Take 1 tablet (10 mg total) by mouth at bedtime as needed (allergies). Home med., Disp: , Rfl:    ondansetron (ZOFRAN ODT) 4 MG disintegrating tablet, Take 1 tablet (4 mg total) by mouth every 8 (eight) hours as needed for nausea or vomiting., Disp: 20 tablet, Rfl: 3   pregabalin (LYRICA) 75 MG capsule, Take 1 capsule (75 mg total) by mouth 3 (three) times daily., Disp: , Rfl:    RABEprazole (ACIPHEX) 20 MG tablet, TAKE 1 TABLET BY MOUTH EVERY DAY, Disp: 90 tablet, Rfl: 4   rosuvastatin (CRESTOR) 20 MG tablet, Take 20 mg by mouth daily., Disp: , Rfl:    simethicone (MYLICON) 80 MG chewable tablet, Chew 80 mg by mouth every 6 (six) hours as needed for flatulence., Disp: , Rfl:    traZODone (DESYREL) 50 MG tablet, Take 50-100 mg by mouth at bedtime as needed for sleep., Disp: , Rfl:   Allergies  Allergen Reactions   Doxycycline Other (See Comments)    Emotional changes/anxious   Biaxin [Clarithromycin]     GI upset, and bad taste in mouth.    Objective:   BP 130/66   Pulse 74   Temp 97.8 F (36.6 C) (Temporal)   Ht 5' (1.524 m)   Wt 139 lb 12.8 oz (63.4 kg)   SpO2 98%   BMI 27.30 kg/m    Physical Exam Constitutional:      General: She is not in acute distress.    Appearance: Normal appearance. She is normal weight. She is not ill-appearing, toxic-appearing or diaphoretic.  HENT:     Head: Normocephalic.     Right Ear: A middle ear effusion is present. Tympanic membrane is not erythematous.     Left Ear: A middle ear effusion is present. Tympanic membrane is not erythematous.     Nose:     Right Sinus: Frontal  sinus tenderness present.     Left Sinus: Frontal sinus tenderness present.     Mouth/Throat:     Mouth: Mucous membranes are dry.     Pharynx: No oropharyngeal exudate or posterior oropharyngeal erythema.  Eyes:     Extraocular Movements: Extraocular movements intact.     Pupils: Pupils are equal, round, and reactive to light.  Cardiovascular:     Rate and Rhythm: Normal rate and regular rhythm.     Pulses: Normal pulses.     Heart sounds: Murmur heard.  Pulmonary:     Effort: Pulmonary effort is normal.     Breath sounds: Normal breath sounds.  Musculoskeletal:     Cervical back: Normal range of motion.     Right lower leg: No edema.     Left lower leg: No edema.  Neurological:     General: No focal deficit present.     Mental Status: She is alert and oriented to person, place, and time. Mental status is at baseline.  Psychiatric:        Mood and Affect: Mood normal.        Behavior: Behavior normal.        Thought Content: Thought content normal.        Judgment: Judgment normal.     Assessment & Plan:  Strep pharyngitis Assessment & Plan: Strep tested positive in office.  rx augmentin 875/125 mg po bid x 10 days Augmentin chosen over amox due to symptomatic as well for sinusitis Ibuprofen/tyelnol prn sore throat/fever Pt told to F/u if no improvement in the next 2-3 days.   Orders: -     Amoxicillin-Pot Clavulanate; Take 1 tablet by mouth 2 (two) times daily for 10 days.  Dispense: 20 tablet; Refill: 0  Acute non-recurrent frontal sinusitis -     Amoxicillin-Pot Clavulanate; Take 1 tablet by mouth 2 (two) times daily for 10 days.  Dispense: 20 tablet; Refill: 0  Contact with and (suspected) exposure to covid-19 -     POCT rapid strep A  Sore throat -     POC COVID-19 BinaxNow  Exposure to the flu -     POCT Influenza A/B     Follow up plan: Return in about 3 months (around 11/16/2022) for f/u diabetes.  Eugenia Pancoast, FNP

## 2022-08-17 DIAGNOSIS — G4700 Insomnia, unspecified: Secondary | ICD-10-CM | POA: Diagnosis not present

## 2022-08-17 DIAGNOSIS — F331 Major depressive disorder, recurrent, moderate: Secondary | ICD-10-CM | POA: Diagnosis not present

## 2022-08-17 DIAGNOSIS — R41840 Attention and concentration deficit: Secondary | ICD-10-CM | POA: Diagnosis not present

## 2022-08-17 DIAGNOSIS — F411 Generalized anxiety disorder: Secondary | ICD-10-CM | POA: Diagnosis not present

## 2022-08-25 ENCOUNTER — Other Ambulatory Visit: Payer: Self-pay | Admitting: Family

## 2022-08-25 DIAGNOSIS — E1140 Type 2 diabetes mellitus with diabetic neuropathy, unspecified: Secondary | ICD-10-CM

## 2022-08-29 ENCOUNTER — Telehealth: Payer: Self-pay | Admitting: Family

## 2022-08-29 DIAGNOSIS — M25512 Pain in left shoulder: Secondary | ICD-10-CM | POA: Diagnosis not present

## 2022-08-29 NOTE — Telephone Encounter (Signed)
Patient called back in regarding this matter.She would like to know what she should do. Please advise.

## 2022-08-29 NOTE — Telephone Encounter (Signed)
Patient was seen and treated for strep . She was prescribed amoxicillin,which she finished ion Sunday. She called in today stating that she is not better,she's still having congestion,fatigue,and throat is still sore,with a cough. She would like to know if she should come back in to get reevaluated or could another medication be called in for her to try? Please advise.

## 2022-08-30 ENCOUNTER — Encounter: Payer: Self-pay | Admitting: Family

## 2022-08-30 NOTE — Telephone Encounter (Signed)
Spoke with the patient and scheduled appt for 09/01/22 to be re-swabbed and evaluated.

## 2022-08-30 NOTE — Telephone Encounter (Signed)
Have her come back in. We can do a throat culture to see what is going on.

## 2022-08-30 NOTE — Telephone Encounter (Signed)
Spoke with the patient and scheduled a f/u appt tomorrow to be seen.

## 2022-08-31 DIAGNOSIS — F331 Major depressive disorder, recurrent, moderate: Secondary | ICD-10-CM | POA: Diagnosis not present

## 2022-08-31 DIAGNOSIS — F411 Generalized anxiety disorder: Secondary | ICD-10-CM | POA: Diagnosis not present

## 2022-09-01 ENCOUNTER — Ambulatory Visit (INDEPENDENT_AMBULATORY_CARE_PROVIDER_SITE_OTHER): Payer: PPO | Admitting: Family

## 2022-09-01 ENCOUNTER — Encounter: Payer: Self-pay | Admitting: Family

## 2022-09-01 VITALS — BP 130/72 | HR 64 | Temp 98.1°F | Ht 60.0 in | Wt 142.0 lb

## 2022-09-01 DIAGNOSIS — J3489 Other specified disorders of nose and nasal sinuses: Secondary | ICD-10-CM | POA: Diagnosis not present

## 2022-09-01 DIAGNOSIS — J329 Chronic sinusitis, unspecified: Secondary | ICD-10-CM | POA: Insufficient documentation

## 2022-09-01 DIAGNOSIS — Z20822 Contact with and (suspected) exposure to covid-19: Secondary | ICD-10-CM

## 2022-09-01 DIAGNOSIS — J029 Acute pharyngitis, unspecified: Secondary | ICD-10-CM | POA: Diagnosis not present

## 2022-09-01 LAB — POC COVID19 BINAXNOW: SARS Coronavirus 2 Ag: NEGATIVE

## 2022-09-01 MED ORDER — METHYLPREDNISOLONE 4 MG PO TBPK: ORAL_TABLET | ORAL | 0 refills | Status: DC

## 2022-09-01 MED ORDER — CEFDINIR 300 MG PO CAPS: 300.0000 mg | ORAL_CAPSULE | Freq: Two times a day (BID) | ORAL | 0 refills | Status: AC

## 2022-09-01 NOTE — Progress Notes (Signed)
Established Patient Office Visit  Subjective:   Patient ID: Katie Buckley, female    DOB: Jul 05, 1950  Age: 72 y.o. MRN: [id number]  CC:  Chief Complaint  Patient presents with   Sore Throat   Ear Pain    HPI: [place] is a 72 y.o. female presenting today for sore throat and ear pain.   Patient positive for strep pharyngitis on 3/6 and given Augmentin 875/125mg  BID x 10 days. Was tested on the same date for COVID and Flu, both negative. Patient continues to have symptoms of  sore throat, ear pain (L>R), headaches, body aches, nasal congestion, sinus pressure/pain, rhinorrhea, non-productive cough. occasional chills, nausea, and diarrhea. Patient reports that symptoms improved while on antibiotics but never went away. She reports the only new symptoms she is having is malaise, and lightheadedness. She admits to have a decrease appetite and a decrease in fluid intake. Denies fever, vomiting, chest pain, shortness of breath. Besides the antibiotics patient has taken Allegra daily, and OTC Tylenol for pain.     ROS: Negative unless specifically indicated above in HPI.   Relevant past medical history reviewed and updated as indicated.   Allergies and medications reviewed and updated.   Current Outpatient Medications:    acetaminophen (TYLENOL) 650 MG CR tablet, Take 1,300 mg by mouth every 8 (eight) hours as needed for pain (pain)., Disp: , Rfl:    apixaban (ELIQUIS) 5 MG TABS tablet, Take 1 tablet (5 mg total) by mouth 2 (two) times daily., Disp: 60 tablet, Rfl: 2   buPROPion (WELLBUTRIN SR) 150 MG 12 hr tablet, Take 150 mg by mouth 2 (two) times daily., Disp: , Rfl:    butalbital-acetaminophen-caffeine (FIORICET) 50-325-40 MG tablet, Take 1 tablet by mouth every 6 (six) hours as needed for headache., Disp: 30 tablet, Rfl: 4   cefdinir (OMNICEF) 300 MG capsule, Take 1 capsule (300 mg total) by mouth 2 (two) times daily for 5 days., Disp: 10 capsule, Rfl: 0   ezetimibe (ZETIA)  10 MG tablet, Take 1 tablet (10 mg total) by mouth at bedtime., Disp: 90 tablet, Rfl: 3   fexofenadine (ALLEGRA) 180 MG tablet, Take 180 mg by mouth daily as needed for allergies or rhinitis., Disp: , Rfl:    FLUoxetine (PROZAC) 40 MG capsule, Take 40 mg by mouth daily., Disp: , Rfl:    glipiZIDE (GLUCOTROL) 5 MG tablet, TAKE 1 TABLET (5 MG TOTAL) BY MOUTH DAILY., Disp: 90 tablet, Rfl: 0   irbesartan (AVAPRO) 75 MG tablet, TAKE 1 TABLET BY MOUTH EVERY DAY, Disp: 90 tablet, Rfl: 4   Lancet Devices MISC, , Disp: , Rfl:    metFORMIN (GLUCOPHAGE-XR) 500 MG 24 hr tablet, TAKE 2 TABLETS BY MOUTH EVERY DAY, Disp: 180 tablet, Rfl: 3   methylPREDNISolone (MEDROL DOSEPAK) 4 MG TBPK tablet, Take per package instructions, Disp: 21 tablet, Rfl: 0   metoprolol tartrate (LOPRESSOR) 50 MG tablet, Take 1 tablet (50 mg total) by mouth 2 (two) times daily., Disp: 60 tablet, Rfl: 2   montelukast (SINGULAIR) 10 MG tablet, Take 1 tablet (10 mg total) by mouth at bedtime as needed (allergies). Home med., Disp: , Rfl:    ondansetron (ZOFRAN ODT) 4 MG disintegrating tablet, Take 1 tablet (4 mg total) by mouth every 8 (eight) hours as needed for nausea or vomiting., Disp: 20 tablet, Rfl: 3   pregabalin (LYRICA) 75 MG capsule, Take 1 capsule (75 mg total) by mouth 3 (three) times daily., Disp: , Rfl:  RABEprazole (ACIPHEX) 20 MG tablet, TAKE 1 TABLET BY MOUTH EVERY DAY, Disp: 90 tablet, Rfl: 4   rosuvastatin (CRESTOR) 20 MG tablet, TAKE 1 TABLET BY MOUTH EVERY DAY IN THE EVENING, Disp: 90 tablet, Rfl: 4   simethicone (MYLICON) 80 MG chewable tablet, Chew 80 mg by mouth every 6 (six) hours as needed for flatulence., Disp: , Rfl:    traZODone (DESYREL) 50 MG tablet, Take 50-100 mg by mouth at bedtime as needed for sleep., Disp: , Rfl:   Allergies  Allergen Reactions   Doxycycline Other (See Comments)    Emotional changes/anxious   Biaxin [Clarithromycin]     GI upset, and bad taste in mouth.    Objective:   BP  130/72   Pulse 64   Temp 98.1 F (36.7 C) (Temporal)   Ht 5' (1.524 m)   Wt 142 lb (64.4 kg)   SpO2 98%   BMI 27.73 kg/m    Physical Exam Vitals and nursing note reviewed.  Constitutional:      General: She is not in acute distress.    Appearance: Normal appearance. She is normal weight. She is not ill-appearing or toxic-appearing.  HENT:     Head: Normocephalic.     Right Ear: Hearing and external ear normal. No drainage, swelling or tenderness. A middle ear effusion is present.     Left Ear: Hearing, ear canal and external ear normal. No drainage, swelling or tenderness. A middle ear effusion is present.     Nose: Congestion present.     Right Sinus: Maxillary sinus tenderness and frontal sinus tenderness present.     Left Sinus: Maxillary sinus tenderness and frontal sinus tenderness present.     Mouth/Throat:     Mouth: Mucous membranes are dry.     Pharynx: Pharyngeal swelling and posterior oropharyngeal erythema present. No oropharyngeal exudate.     Tonsils: No tonsillar exudate. 0 on the right. 0 on the left.  Eyes:     Extraocular Movements: Extraocular movements intact.     Conjunctiva/sclera: Conjunctivae normal.     Pupils: Pupils are equal, round, and reactive to light.  Cardiovascular:     Rate and Rhythm: Normal rate and regular rhythm.     Pulses: Normal pulses.     Heart sounds: Murmur heard.  Pulmonary:     Effort: Pulmonary effort is normal.     Breath sounds: Normal breath sounds.  Musculoskeletal:     Right lower leg: No edema.     Left lower leg: No edema.  Lymphadenopathy:     Cervical: Cervical adenopathy present.     Right cervical: Superficial cervical adenopathy present.     Left cervical: Superficial cervical adenopathy present.  Skin:    General: Skin is warm and dry.     Capillary Refill: Capillary refill takes less than 2 seconds.  Neurological:     General: No focal deficit present.     Mental Status: She is alert and oriented to  person, place, and time.  Psychiatric:        Mood and Affect: Mood normal.        Behavior: Behavior normal.        Thought Content: Thought content normal.        Judgment: Judgment normal.     Assessment & Plan:  Recurrent sinusitis Assessment & Plan: Re-tested for COVID and result negative. Throat culture ordered and pending.  Rx sent to patient's pharmacy for Cefdinir 300 mg PO BID x 5  days, and Medrol dose pack for sinus pressure.  Advised patient to continue OTC Tylenol or Ibuprofen as needed for sinus pain and to continue with humidifier as needed and steam showers. Recommended patient continue to take Allegra daily and to add OTC Flonase daily. Patient to notify provider if  no symptom improvement in 48 hours   I evaluated the patient,  was consulted regarding plans for treatment of care, and agree with the assessment and plan per Joya Gaskins, RN, DNP student.  Eugenia Pancoast, FNP-C    Orders: -     methylPREDNISolone; Take per package instructions  Dispense: 21 tablet; Refill: 0 -     Cefdinir; Take 1 capsule (300 mg total) by mouth 2 (two) times daily for 5 days.  Dispense: 10 capsule; Refill: 0  Sinus pressure -     methylPREDNISolone; Take per package instructions  Dispense: 21 tablet; Refill: 0  Sore throat Assessment & Plan: Throat culture ordered and pending results.   Orders: -     Culture, Group A Strep  Contact with and (suspected) exposure to covid-19 -     POC COVID-19 BinaxNow     Follow up plan: Return if symptoms worsen or fail to improve.  Eugenia Pancoast, FNP AGNP-student

## 2022-09-01 NOTE — Progress Notes (Signed)
Established Patient Office Visit  Subjective:   Patient ID: Katie Buckley, female    DOB: [birth year]  Age: 72 y.o. MRN: [id number]  CC:  Chief Complaint  Patient presents with   Sore Throat   Ear Pain    HPI: [place] is a 72 y.o. female presenting on 09/01/2022 for Sore Throat and Ear Pain   Sore Throat     Treated with augmentin which she has completed in its entire ten day duration. Still with sinus pressure, sore throat, and nasal congestion. No fever but did have chills. Slight cough that is nonproductive with mild chest congestion. She states symptoms are slightly worse from last time as the sinus pressure especially in the left ear and headaches have increased, and increased dizziness with sinus pressure.  Is known to have chronic sinus issues.  Is taking allegra.   Lab Results  Component Value Date   HGBA1C 6.1 03/08/2022         ROS: Negative unless specifically indicated above in HPI.   Relevant past medical history reviewed and updated as indicated.   Allergies and medications reviewed and updated.   Current Outpatient Medications:    acetaminophen (TYLENOL) 650 MG CR tablet, Take 1,300 mg by mouth every 8 (eight) hours as needed for pain (pain)., Disp: , Rfl:    apixaban (ELIQUIS) 5 MG TABS tablet, Take 1 tablet (5 mg total) by mouth 2 (two) times daily., Disp: 60 tablet, Rfl: 2   buPROPion (WELLBUTRIN SR) 150 MG 12 hr tablet, Take 150 mg by mouth 2 (two) times daily., Disp: , Rfl:    butalbital-acetaminophen-caffeine (FIORICET) 50-325-40 MG tablet, Take 1 tablet by mouth every 6 (six) hours as needed for headache., Disp: 30 tablet, Rfl: 4   cefdinir (OMNICEF) 300 MG capsule, Take 1 capsule (300 mg total) by mouth 2 (two) times daily for 5 days., Disp: 10 capsule, Rfl: 0   ezetimibe (ZETIA) 10 MG tablet, Take 1 tablet (10 mg total) by mouth at bedtime., Disp: 90 tablet, Rfl: 3   fexofenadine (ALLEGRA) 180 MG tablet, Take 180 mg by mouth daily  as needed for allergies or rhinitis., Disp: , Rfl:    FLUoxetine (PROZAC) 40 MG capsule, Take 40 mg by mouth daily., Disp: , Rfl:    glipiZIDE (GLUCOTROL) 5 MG tablet, TAKE 1 TABLET (5 MG TOTAL) BY MOUTH DAILY., Disp: 90 tablet, Rfl: 0   irbesartan (AVAPRO) 75 MG tablet, TAKE 1 TABLET BY MOUTH EVERY DAY, Disp: 90 tablet, Rfl: 4   Lancet Devices MISC, , Disp: , Rfl:    metFORMIN (GLUCOPHAGE-XR) 500 MG 24 hr tablet, TAKE 2 TABLETS BY MOUTH EVERY DAY, Disp: 180 tablet, Rfl: 3   methylPREDNISolone (MEDROL DOSEPAK) 4 MG TBPK tablet, Take per package instructions, Disp: 21 tablet, Rfl: 0   metoprolol tartrate (LOPRESSOR) 50 MG tablet, Take 1 tablet (50 mg total) by mouth 2 (two) times daily., Disp: 60 tablet, Rfl: 2   montelukast (SINGULAIR) 10 MG tablet, Take 1 tablet (10 mg total) by mouth at bedtime as needed (allergies). Home med., Disp: , Rfl:    ondansetron (ZOFRAN ODT) 4 MG disintegrating tablet, Take 1 tablet (4 mg total) by mouth every 8 (eight) hours as needed for nausea or vomiting., Disp: 20 tablet, Rfl: 3   pregabalin (LYRICA) 75 MG capsule, Take 1 capsule (75 mg total) by mouth 3 (three) times daily., Disp: , Rfl:    RABEprazole (ACIPHEX) 20 MG tablet, TAKE 1 TABLET BY MOUTH EVERY  DAY, Disp: 90 tablet, Rfl: 4   rosuvastatin (CRESTOR) 20 MG tablet, TAKE 1 TABLET BY MOUTH EVERY DAY IN THE EVENING, Disp: 90 tablet, Rfl: 4   simethicone (MYLICON) 80 MG chewable tablet, Chew 80 mg by mouth every 6 (six) hours as needed for flatulence., Disp: , Rfl:    traZODone (DESYREL) 50 MG tablet, Take 50-100 mg by mouth at bedtime as needed for sleep., Disp: , Rfl:   Allergies  Allergen Reactions   Doxycycline Other (See Comments)    Emotional changes/anxious   Biaxin [Clarithromycin]     GI upset, and bad taste in mouth.    Objective:   BP 130/72   Pulse 64   Temp 98.1 F (36.7 C) (Temporal)   Ht 5' (1.524 m)   Wt 142 lb (64.4 kg)   SpO2 98%   BMI 27.73 kg/m    Physical Exam Vitals  reviewed.  Constitutional:      General: She is not in acute distress.    Appearance: Normal appearance. She is normal weight. She is not ill-appearing, toxic-appearing or diaphoretic.  HENT:     Head: Normocephalic.     Right Ear: Tympanic membrane normal.     Left Ear: Tympanic membrane normal.     Nose: Congestion present.     Right Sinus: Frontal sinus tenderness present.     Left Sinus: Frontal sinus tenderness present.     Mouth/Throat:     Mouth: Mucous membranes are dry.     Pharynx: Posterior oropharyngeal erythema present. No oropharyngeal exudate.  Eyes:     Extraocular Movements: Extraocular movements intact.     Pupils: Pupils are equal, round, and reactive to light.  Cardiovascular:     Rate and Rhythm: Normal rate and regular rhythm.     Pulses: Normal pulses.     Heart sounds: Murmur heard.  Pulmonary:     Effort: Pulmonary effort is normal.     Breath sounds: Normal breath sounds.  Musculoskeletal:     Cervical back: Normal range of motion.  Neurological:     General: No focal deficit present.     Mental Status: She is alert and oriented to person, place, and time. Mental status is at baseline.  Psychiatric:        Mood and Affect: Mood normal.        Behavior: Behavior normal.        Thought Content: Thought content normal.        Judgment: Judgment normal.     Assessment & Plan:  Recurrent sinusitis Assessment & Plan: Re-tested for COVID and result negative. Throat culture ordered and pending.  Rx sent to patient's pharmacy for Cefdinir 300 mg PO BID x 5 days, and Medrol dose pack for sinus pressure.  Advised patient to continue OTC Tylenol or Ibuprofen as needed for sinus pain and to continue with humidifier as needed and steam showers. Recommended patient continue to take Allegra daily and to add OTC Flonase daily. Patient to notify provider if  no symptom improvement in 48 hours   I evaluated the patient,  was consulted regarding plans for treatment  of care, and agree with the assessment and plan per Joya Gaskins, RN, DNP student.  Eugenia Pancoast, FNP-C    Orders: -     methylPREDNISolone; Take per package instructions  Dispense: 21 tablet; Refill: 0 -     Cefdinir; Take 1 capsule (300 mg total) by mouth 2 (two) times daily for 5 days.  Dispense:  10 capsule; Refill: 0  Sinus pressure -     methylPREDNISolone; Take per package instructions  Dispense: 21 tablet; Refill: 0  Sore throat Assessment & Plan: Throat culture ordered and pending results.   Orders: -     Culture, Group A Strep  Contact with and (suspected) exposure to covid-19 -     POC COVID-19 BinaxNow     Follow up plan: Return if symptoms worsen or fail to improve.  Eugenia Pancoast, FNP

## 2022-09-01 NOTE — Assessment & Plan Note (Addendum)
Re-tested for COVID and result negative. Throat culture ordered and pending.  Rx sent to patient's pharmacy for Cefdinir 300 mg PO BID x 5 days, and Medrol dose pack for sinus pressure.  Advised patient to continue OTC Tylenol or Ibuprofen as needed for sinus pain and to continue with humidifier as needed and steam showers. Recommended patient continue to take Allegra daily and to add OTC Flonase daily. Patient to notify provider if  no symptom improvement in 48 hours   I evaluated the patient,  was consulted regarding plans for treatment of care, and agree with the assessment and plan per Joya Gaskins, RN, DNP student.  -Eugenia Pancoast, FNP-C

## 2022-09-01 NOTE — Patient Instructions (Signed)
Take flonase over the counter and continue allegra.  Take new antibiotic and also steroid pack.    Regards,   Eugenia Pancoast FNP-C

## 2022-09-01 NOTE — Assessment & Plan Note (Signed)
Throat culture ordered and pending results.

## 2022-09-03 LAB — CULTURE, GROUP A STREP
MICRO NUMBER:: 14729692
SPECIMEN QUALITY:: ADEQUATE

## 2022-09-05 ENCOUNTER — Ambulatory Visit (INDEPENDENT_AMBULATORY_CARE_PROVIDER_SITE_OTHER): Payer: PPO | Admitting: Plastic Surgery

## 2022-09-05 ENCOUNTER — Encounter: Payer: Self-pay | Admitting: Plastic Surgery

## 2022-09-05 VITALS — BP 205/71 | HR 66 | Ht 59.5 in | Wt 141.6 lb

## 2022-09-05 DIAGNOSIS — F419 Anxiety disorder, unspecified: Secondary | ICD-10-CM

## 2022-09-05 DIAGNOSIS — Z7901 Long term (current) use of anticoagulants: Secondary | ICD-10-CM | POA: Diagnosis not present

## 2022-09-05 DIAGNOSIS — I5033 Acute on chronic diastolic (congestive) heart failure: Secondary | ICD-10-CM

## 2022-09-05 DIAGNOSIS — Z951 Presence of aortocoronary bypass graft: Secondary | ICD-10-CM

## 2022-09-05 DIAGNOSIS — E1165 Type 2 diabetes mellitus with hyperglycemia: Secondary | ICD-10-CM | POA: Diagnosis not present

## 2022-09-05 DIAGNOSIS — T8543XA Leakage of breast prosthesis and implant, initial encounter: Secondary | ICD-10-CM | POA: Diagnosis not present

## 2022-09-05 NOTE — Progress Notes (Signed)
Patient ID: Katie Buckley, female    DOB: [birth year], 72 y.o.   MRN: [id number]   Chief Complaint  Patient presents with   Consult   Breast Problem    The patient is a 72 year old female here for evaluation of her breasts.  The patient is 4 feet 11 inches tall weighs 141 pounds.  The patient states she had implants placed when she was 72 years old.  She thinks that they were silicone.  She then had them replaced in her 18s and thought that they were saline however her recent mammogram indicates that they were silicone.  She had heart surgery last year and is on Eliquis.  She does not wear a bra so she is not sure what size she is.  She has a history of diabetes and cardiac disease and hypertension.  Her mammogram was done in November 2023 and showed no evidence of malignancy.  She had a right breast implant that had extracapsular silicone rupture which was stable from her 2016 mammogram the left implant does not appear to be ruptured but most likely it is as well.  The patient complains of sternal pain and left arm pain.  She has grade 3 ptosis of her breasts.  And the right implant is extremely firm full.    Review of Systems  Constitutional: Negative.   HENT: Negative.    Eyes: Negative.   Respiratory: Negative.    Cardiovascular: Negative.   Gastrointestinal: Negative.   Endocrine: Negative.   Genitourinary: Negative.   Musculoskeletal: Negative.     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    Past Surgical History:  Procedure  Laterality Date   ANTERIOR CERVICAL DECOMP/DISCECTOMY FUSION N/A 11/15/2020   Procedure: ANTERIOR CERVICAL DECOMPRESSION/DISCECTOMY FUSION 1 LEVEL C3/4;  Surgeon: Deetta Perla, MD;  Location: [facility];  Service: Neurosurgery;  Laterality: N/A;   AORTIC VALVE REPLACEMENT N/A 09/19/2021   Procedure: AORTIC VALVE REPLACEMENT Using 7mm Edwards Resilia Inspiris Valve;  Surgeon: Gaye Pollack, MD;  Location: [place];  Service: Open Heart Surgery;  Laterality: N/A;   AUGMENTATION MAMMAPLASTY Bilateral    breast implants   Bone Spur  2007   foot   CARDIAC CATHETERIZATION     Carpal Tunnel Symdrome  2008   as repeated in 2009   CATARACT EXTRACTION W/PHACO Left 01/16/2019   Procedure: CATARACT EXTRACTION PHACO AND INTRAOCULAR LENS PLACEMENT ([place]) LEFT DIABETES VISION BLUE;  Surgeon: Marchia Meiers, MD;  Location: [facility];  Service: Ophthalmology;  Laterality: Left;  [place]  00:47 CDE 6.62 Fluid pack lot # [id number]   CATARACT EXTRACTION W/PHACO Right 02/06/2019   Procedure: CATARACT EXTRACTION PHACO AND INTRAOCULAR LENS PLACEMENT ([facility]);  Surgeon: Marchia Meiers, MD;  Location: [facility];  Service: Ophthalmology;  Laterality: Right;  [place] 00:54.6 CDE 6.52 FLUID PACK LOT # [id number]   CHOLECYSTECTOMY  2007   COLONOSCOPY WITH PROPOFOL N/A 11/17/2015   Procedure: COLONOSCOPY WITH PROPOFOL;  Surgeon: Manya Silvas, MD;  Location: [facility];  Service: Endoscopy;  Laterality: N/A;   COLONOSCOPY WITH PROPOFOL N/A 06/21/2022   Procedure: COLONOSCOPY WITH PROPOFOL;  Surgeon: Jonathon Bellows, MD;  [facility];  Service: Gastroenterology;  Laterality: N/A;   CORONARY ARTERY BYPASS GRAFT N/A 09/19/2021   Procedure: CORONARY ARTERY BYPASS GRAFTING X1 Using Left Internal Mammory artery;  Surgeon: Gaye Pollack, MD;  Location: [place];  Service: Open Heart Surgery;  Laterality: N/A;   ESOPHAGOGASTRODUODENOSCOPY (EGD) WITH PROPOFOL N/A 11/17/2015   Procedure: ESOPHAGOGASTRODUODENOSCOPY (EGD) WITH PROPOFOL;   Surgeon: Manya Silvas, MD;  Location: [facility] ENDOSCOPY;  Service: Endoscopy;  Laterality: N/A;   INTRAVASCULAR PRESSURE WIRE/FFR STUDY N/A 05/24/2021   Procedure: INTRAVASCULAR PRESSURE WIRE/FFR STUDY;  Surgeon: Yolonda Kida, MD;  Location: [place] [facility];  Service: Cardiovascular;  Laterality: N/A;   MANDIBLE SURGERY     NASAL SINUS SURGERY  03/2013   Multile surgery Dr. Carlis Abbott. 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 Tamala Julian   RIGHT/LEFT HEART CATH AND CORONARY ANGIOGRAPHY N/A 05/24/2021   Procedure: RIGHT/LEFT HEART CATH AND CORONARY ANGIOGRAPHY;  Surgeon: Yolonda Kida, MD;  Location: [place] [facility];  Service: Cardiovascular;  Laterality: N/A;   TEE WITHOUT CARDIOVERSION N/A 09/19/2021   Procedure: TRANSESOPHAGEAL ECHOCARDIOGRAM (TEE);  Surgeon: Gaye Pollack, MD;  Location: [place];  Service: Open Heart Surgery;  Laterality: N/A;   TONSILLECTOMY  1960      Current Outpatient Medications:    acetaminophen (TYLENOL) 650 MG CR tablet, Take 1,300 mg by mouth every 8 (eight) hours as needed for pain (pain)., Disp: , Rfl:    buPROPion (WELLBUTRIN SR) 150 MG 12 hr tablet, Take 150 mg by mouth 2 (two) times daily., Disp: , Rfl:    butalbital-acetaminophen-caffeine (FIORICET) 50-325-40 MG tablet, Take 1 tablet by mouth every 6 (six) hours as needed for headache., Disp: 30 tablet, Rfl: 4   cefdinir (OMNICEF) 300 MG capsule, Take 1 capsule (300 mg total) by mouth 2 (two) times daily for 5 days., Disp: 10 capsule, Rfl: 0   ezetimibe (ZETIA) 10 MG tablet, Take 1 tablet (10 mg total) by mouth at bedtime., Disp: 90 tablet, Rfl: 3   fexofenadine (ALLEGRA) 180 MG tablet, Take 180 mg by mouth daily as needed for allergies or rhinitis., Disp: , Rfl:    FLUoxetine (PROZAC) 40 MG capsule, Take 40 mg by mouth daily., Disp: , Rfl:    glipiZIDE (GLUCOTROL) 5 MG tablet, TAKE 1 TABLET (5 MG TOTAL) BY MOUTH DAILY., Disp: 90 tablet, Rfl: 0    irbesartan (AVAPRO) 75 MG tablet, TAKE 1 TABLET BY MOUTH EVERY DAY, Disp: 90 tablet, Rfl: 4   Lancet Devices MISC, , Disp: , Rfl:    metFORMIN (GLUCOPHAGE-XR) 500 MG 24 hr tablet, TAKE 2 TABLETS BY MOUTH EVERY DAY, Disp: 180 tablet, Rfl: 3   methylPREDNISolone (MEDROL DOSEPAK) 4 MG TBPK tablet, Take per package instructions, Disp: 21 tablet, Rfl: 0   metoprolol tartrate (LOPRESSOR) 50 MG tablet, Take 1 tablet (50 mg total) by mouth 2 (two) times daily., Disp: 60 tablet, Rfl: 2   montelukast (SINGULAIR) 10 MG tablet, Take 1 tablet (10 mg total) by mouth at bedtime as needed (allergies). Home med., Disp: , Rfl:    ondansetron (ZOFRAN ODT) 4 MG disintegrating tablet, Take 1 tablet (4 mg total) by mouth every 8 (eight) hours as needed for nausea or vomiting., Disp: 20 tablet, Rfl: 3  pregabalin (LYRICA) 75 MG capsule, Take 1 capsule (75 mg total) by mouth 3 (three) times daily., Disp: , Rfl:    RABEprazole (ACIPHEX) 20 MG tablet, TAKE 1 TABLET BY MOUTH EVERY DAY, Disp: 90 tablet, Rfl: 4   rosuvastatin (CRESTOR) 20 MG tablet, TAKE 1 TABLET BY MOUTH EVERY DAY IN THE EVENING, Disp: 90 tablet, Rfl: 4   simethicone (MYLICON) 80 MG chewable tablet, Chew 80 mg by mouth every 6 (six) hours as needed for flatulence., Disp: , Rfl:    traZODone (DESYREL) 50 MG tablet, Take 50-100 mg by mouth at bedtime as needed for sleep., Disp: , Rfl:    apixaban (ELIQUIS) 5 MG TABS tablet, Take 1 tablet (5 mg total) by mouth 2 (two) times daily., Disp: 60 tablet, Rfl: 2   Objective:   Vitals:   09/05/22 1020  BP: (!) 205/71  Pulse: 66  SpO2: 94%    Physical Exam Vitals and nursing note reviewed.  Constitutional:      Appearance: Normal appearance.  HENT:     Head: Atraumatic.  Cardiovascular:     Rate and Rhythm: Normal rate.     Pulses: Normal pulses.  Pulmonary:     Effort: Pulmonary effort is normal.  Abdominal:     Palpations: Abdomen is soft.  Skin:    General: Skin is warm.     Capillary Refill:  Capillary refill takes less than 2 seconds.  Neurological:     Mental Status: She is alert and oriented to person, place, and time.  Psychiatric:        Mood and Affect: Mood normal.        Behavior: Behavior normal.        Thought Content: Thought content normal.        Judgment: Judgment normal.     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  It would be safest not to put any implants back.  The patient is in agreement.  Pictures were obtained of the patient and placed in the chart with the patient's or guardian's permission.   [place], DO

## 2022-09-06 DIAGNOSIS — M25512 Pain in left shoulder: Secondary | ICD-10-CM | POA: Diagnosis not present

## 2022-09-07 ENCOUNTER — Encounter: Payer: Self-pay | Admitting: *Deleted

## 2022-09-08 ENCOUNTER — Other Ambulatory Visit: Payer: Self-pay | Admitting: Family

## 2022-09-08 DIAGNOSIS — E1140 Type 2 diabetes mellitus with diabetic neuropathy, unspecified: Secondary | ICD-10-CM

## 2022-09-08 DIAGNOSIS — M7542 Impingement syndrome of left shoulder: Secondary | ICD-10-CM | POA: Diagnosis not present

## 2022-09-10 ENCOUNTER — Emergency Department
Admission: EM | Admit: 2022-09-10 | Discharge: 2022-09-10 | Disposition: A | Discharge status: Home / Self Care | Payer: PPO | Attending: Emergency Medicine | Admitting: Emergency Medicine

## 2022-09-10 ENCOUNTER — Other Ambulatory Visit: Payer: Self-pay | Admitting: Physician Assistant

## 2022-09-10 DIAGNOSIS — I10 Essential (primary) hypertension: Secondary | ICD-10-CM | POA: Diagnosis not present

## 2022-09-10 DIAGNOSIS — E119 Type 2 diabetes mellitus without complications: Secondary | ICD-10-CM | POA: Insufficient documentation

## 2022-09-10 LAB — CBC WITH DIFFERENTIAL/PLATELET
Abs Immature Granulocytes: 0.03 10*3/uL (ref 0.00–0.07)
Basophils Absolute: 0 10*3/uL (ref 0.0–0.1)
Basophils Relative: 0 %
Eosinophils Absolute: 0.1 10*3/uL (ref 0.0–0.5)
Eosinophils Relative: 2 %
HCT: 34.4 % — ABNORMAL LOW (ref 36.0–46.0)
Hemoglobin: 11.4 g/dL — ABNORMAL LOW (ref 12.0–15.0)
Immature Granulocytes: 0 %
Lymphocytes Relative: 31 %
Lymphs Abs: 2.3 10*3/uL (ref 0.7–4.0)
MCH: 29.3 pg (ref 26.0–34.0)
MCHC: 33.1 g/dL (ref 30.0–36.0)
MCV: 88.4 fL (ref 80.0–100.0)
Monocytes Absolute: 0.7 10*3/uL (ref 0.1–1.0)
Monocytes Relative: 10 %
Neutro Abs: 4.2 10*3/uL (ref 1.7–7.7)
Neutrophils Relative %: 57 %
Platelets: 79 10*3/uL — ABNORMAL LOW (ref 150–400)
RBC: 3.89 MIL/uL (ref 3.87–5.11)
RDW: 12.5 % (ref 11.5–15.5)
WBC: 7.3 10*3/uL (ref 4.0–10.5)
nRBC: 0 % (ref 0.0–0.2)

## 2022-09-10 LAB — COMPREHENSIVE METABOLIC PANEL
ALT: 20 U/L (ref 0–44)
AST: 19 U/L (ref 15–41)
Albumin: 3.6 g/dL (ref 3.5–5.0)
Alkaline Phosphatase: 72 U/L (ref 38–126)
Anion gap: 7 (ref 5–15)
BUN: 18 mg/dL (ref 8–23)
CO2: 26 mmol/L (ref 22–32)
Calcium: 8.8 mg/dL — ABNORMAL LOW (ref 8.9–10.3)
Chloride: 102 mmol/L (ref 98–111)
Creatinine, Ser: 0.68 mg/dL (ref 0.44–1.00)
GFR, Estimated: >60 mL/min (ref >60)
Glucose, Bld: 265 mg/dL — ABNORMAL HIGH (ref 70–99)
Potassium: 4.1 mmol/L (ref 3.5–5.1)
Sodium: 135 mmol/L (ref 135–145)
Total Bilirubin: 0.6 mg/dL (ref 0.3–1.2)
Total Protein: 5.8 g/dL — ABNORMAL LOW (ref 6.5–8.1)

## 2022-09-10 MED ORDER — CLONIDINE HCL 0.1 MG PO TABS
0.1000 mg | ORAL_TABLET | Freq: Once | ORAL | Status: AC
  Administered 2022-09-10: 0.1 mg via ORAL
  Filled 2022-09-10: qty 1

## 2022-09-10 MED ORDER — LORAZEPAM 1 MG PO TABS
1.0000 mg | ORAL_TABLET | Freq: Once | ORAL | Status: AC
  Administered 2022-09-10: 1 mg via ORAL
  Filled 2022-09-10: qty 1

## 2022-09-10 MED ORDER — AMLODIPINE BESYLATE 5 MG PO TABS
5.0000 mg | ORAL_TABLET | Freq: Once | ORAL | Status: AC
  Administered 2022-09-10: 5 mg via ORAL
  Filled 2022-09-10: qty 1

## 2022-09-10 MED ORDER — LORAZEPAM 2 MG/ML IJ SOLN
1.0000 mg | Freq: Once | INTRAMUSCULAR | Status: DC
  Filled 2022-09-10: qty 1

## 2022-09-10 MED ORDER — AMLODIPINE BESYLATE 5 MG PO TABS: 5.0000 mg | ORAL_TABLET | Freq: Every day | ORAL | 1 refills | Status: AC

## 2022-09-10 NOTE — Discharge Instructions (Addendum)
Take Amlodipine once daily.  Please make follow-up appointment with Dr. Janese Banks regarding low platelets identified during this emergency department encounter.

## 2022-09-10 NOTE — ED Provider Notes (Signed)
-----------------------------------------   10:35 PM on 09/10/2022 ----------------------------------------- I have personally seen and evaluated the patient in conjunction with physician assistant Kern Reap.  Patient is here for high blood pressure.  Blood pressure as high as 213/68.  Has been taking her blood pressure medications.  On my evaluation patient does seem anxious, had just received a milligram of oral Ativan approximate hour ago.  Patient is very concerned about the blood pressure being elevated.  Reassuringly no chest pain or headache.  Lab work showed reassuring results with a normal CBC and reassuring chemistry.  Patient's blood pressure is down to [id number] systolic on my evaluation.  I had a long discussion with the patient regarding the blood pressure.  I believe as long the blood pressure continues to trend down the patient could be safely discharged home and follow-up with her PCP to discuss her blood pressure regimen.  Patient is agreeable to this plan of care.   Harvest Dark, MD 09/10/22 2236

## 2022-09-10 NOTE — ED Provider Notes (Signed)
[facility] Provider Note  Patient Contact: 6:44 PM (approximate)   History   Hypertension   HPI  [place] is a 72 y.o. female with a history of diabetes, palpitations, hypercholesterolemia, anxiety, presents to the emergency department with elevated blood pressure for the past week.  Patient takes 50 mg of Lopressor twice daily.  No current chest pain or chest tightness.  Patient states that she does have a very mild headache.  No nausea, vomiting or abdominal pain.  No back pain.      Physical Exam   Triage Vital Signs: ED Triage Vitals  Enc Vitals Group     BP 09/10/22 1838 (!) 204/72     Pulse Rate 09/10/22 1838 (!) 52     Resp 09/10/22 1838 17     Temp 09/10/22 1838 97.7 F (36.5 C)     Temp Source 09/10/22 1838 Oral     SpO2 09/10/22 1838 98 %     Weight 09/10/22 1837 143 lb 4.8 oz (65 kg)     Height --      Head Circumference --      Peak Flow --      Pain Score --      Pain Loc --      Pain Edu? --      Excl. in [place]? --     Most recent vital signs: Vitals:   09/10/22 2300 09/10/22 2320  BP: (!) 158/45   Pulse:  (!) 49  Resp:  18  Temp:    SpO2:  93%     General: Alert and in no acute distress. Eyes:  PERRL. EOMI. Head: No acute traumatic findings ENT:      Nose: No congestion/rhinnorhea.      Mouth/Throat: Mucous membranes are moist. Neck: No stridor. No cervical spine tenderness to palpation. Cardiovascular:  Good peripheral perfusion Respiratory: Normal respiratory effort without tachypnea or retractions. Lungs CTAB. Good air entry to the bases with no decreased or absent breath sounds. Gastrointestinal: Bowel sounds 4 quadrants. Soft and nontender to palpation. No guarding or rigidity. No palpable masses. No distention. No CVA tenderness. Musculoskeletal: Full range of motion to all extremities.  Neurologic:  No gross focal neurologic deficits are appreciated.  Skin:   No rash noted    ED Results /  Procedures / Treatments   Labs (all labs ordered are listed, but only abnormal results are displayed) Labs Reviewed  CBC WITH DIFFERENTIAL/PLATELET - Abnormal; Notable for the following components:      Result Value   Hemoglobin 11.4 (*)    HCT 34.4 (*)    Platelets 79 (*)    All other components within normal limits  COMPREHENSIVE METABOLIC PANEL - Abnormal; Notable for the following components:   Glucose, Bld 265 (*)    Calcium 8.8 (*)    Total Protein 5.8 (*)    All other components within normal limits       PROCEDURES:  Critical Care performed: No  Procedures   MEDICATIONS ORDERED IN ED: Medications  amLODipine (NORVASC) tablet 5 mg (5 mg Oral Given 09/10/22 1908)  cloNIDine (CATAPRES) tablet 0.1 mg (0.1 mg Oral Given 09/10/22 2021)  LORazepam (ATIVAN) tablet 1 mg (1 mg Oral Given 09/10/22 2123)     IMPRESSION / MDM / ASSESSMENT AND PLAN / ED COURSE  I reviewed the triage vital signs and the nursing notes.  Assessment and plan: Hypertension:  72 year old female presents to the emergency department with hypertension over the past week.  Patient's blood pressure was 204/72 at triage.  Patient's blood pressure improved with 5 mg of amlodipine, 0.1 mg of Catapres and 1 mg of Ativan.  Patient did have thrombocytopenia compared to her baseline identified on CBC.  Current platelet level was 79,000 with previous lab work showing 168,000.  I talked to patient about this lab finding and gave her referral to hematology and oncology, Dr. Janese Banks.  I also phoned patient's husband and informed him of result so that family member is also aware.  Patient was started on 5 mg of amlodipine in addition to her daily Lopressor.  I did recommend following up with primary care provider and briefing them about antihypertensive added during this emergency department encounter.    Clinical Course as of 09/10/22 [id number]  Nancy Fetter Sep 10, 2022  2017 Creatinine: 0.68 [JW]   2028 MCH: 29.3 [JW]    Clinical Course User Index [JW] Lannie Fields, PA-C     FINAL CLINICAL IMPRESSION(S) / ED DIAGNOSES   Final diagnoses:  Hypertension, unspecified type     Rx / DC Orders   ED Discharge Orders          Ordered    amLODipine (NORVASC) 5 MG tablet  Daily        09/10/22 2314             Note:  This document was prepared using Dragon voice recognition software and may include unintentional dictation errors.   Vallarie Mare Bressler, PA-C 09/10/22 2332    Blake Divine, MD 09/12/22 0730

## 2022-09-10 NOTE — ED Triage Notes (Signed)
Pt sts that she has been having HTN of 200 sys for the last week.

## 2022-09-11 ENCOUNTER — Telehealth: Payer: Self-pay | Admitting: Internal Medicine

## 2022-09-11 NOTE — Telephone Encounter (Signed)
pt called to communicate that referral has been sent over for her to see MD. Says she is currently scheduled with Darrall Dears at the middle of April and feels like she needs to be scheduled sooner. PT appt r/s per pt req and updated to [place] per pt reference

## 2022-09-12 ENCOUNTER — Inpatient Hospital Stay: Payer: PPO | Attending: Internal Medicine

## 2022-09-12 ENCOUNTER — Inpatient Hospital Stay (HOSPITAL_BASED_OUTPATIENT_CLINIC_OR_DEPARTMENT_OTHER): Payer: PPO | Admitting: Internal Medicine

## 2022-09-12 ENCOUNTER — Encounter: Payer: Self-pay | Admitting: Internal Medicine

## 2022-09-12 VITALS — BP 171/61 | HR 53 | Temp 97.7°F | Resp 17 | Wt 137.5 lb

## 2022-09-12 DIAGNOSIS — D696 Thrombocytopenia, unspecified: Secondary | ICD-10-CM | POA: Diagnosis not present

## 2022-09-12 DIAGNOSIS — Z79899 Other long term (current) drug therapy: Secondary | ICD-10-CM | POA: Insufficient documentation

## 2022-09-12 DIAGNOSIS — Z801 Family history of malignant neoplasm of trachea, bronchus and lung: Secondary | ICD-10-CM | POA: Insufficient documentation

## 2022-09-12 DIAGNOSIS — D508 Other iron deficiency anemias: Secondary | ICD-10-CM

## 2022-09-12 DIAGNOSIS — I10 Essential (primary) hypertension: Secondary | ICD-10-CM | POA: Diagnosis not present

## 2022-09-12 DIAGNOSIS — R0602 Shortness of breath: Secondary | ICD-10-CM | POA: Diagnosis not present

## 2022-09-12 DIAGNOSIS — K2270 Barrett's esophagus without dysplasia: Secondary | ICD-10-CM | POA: Diagnosis not present

## 2022-09-12 DIAGNOSIS — E538 Deficiency of other specified B group vitamins: Secondary | ICD-10-CM

## 2022-09-12 DIAGNOSIS — Z7984 Long term (current) use of oral hypoglycemic drugs: Secondary | ICD-10-CM | POA: Diagnosis not present

## 2022-09-12 DIAGNOSIS — E119 Type 2 diabetes mellitus without complications: Secondary | ICD-10-CM | POA: Diagnosis not present

## 2022-09-12 DIAGNOSIS — K219 Gastro-esophageal reflux disease without esophagitis: Secondary | ICD-10-CM | POA: Insufficient documentation

## 2022-09-12 DIAGNOSIS — Z803 Family history of malignant neoplasm of breast: Secondary | ICD-10-CM | POA: Diagnosis not present

## 2022-09-12 DIAGNOSIS — E1165 Type 2 diabetes mellitus with hyperglycemia: Secondary | ICD-10-CM | POA: Diagnosis not present

## 2022-09-12 DIAGNOSIS — D509 Iron deficiency anemia, unspecified: Secondary | ICD-10-CM | POA: Diagnosis not present

## 2022-09-12 DIAGNOSIS — L539 Erythematous condition, unspecified: Secondary | ICD-10-CM | POA: Insufficient documentation

## 2022-09-12 DIAGNOSIS — Z952 Presence of prosthetic heart valve: Secondary | ICD-10-CM | POA: Diagnosis not present

## 2022-09-12 DIAGNOSIS — I350 Nonrheumatic aortic (valve) stenosis: Secondary | ICD-10-CM | POA: Diagnosis not present

## 2022-09-12 DIAGNOSIS — R2689 Other abnormalities of gait and mobility: Secondary | ICD-10-CM | POA: Diagnosis not present

## 2022-09-12 DIAGNOSIS — Z7901 Long term (current) use of anticoagulants: Secondary | ICD-10-CM | POA: Insufficient documentation

## 2022-09-12 DIAGNOSIS — Z85828 Personal history of other malignant neoplasm of skin: Secondary | ICD-10-CM | POA: Diagnosis not present

## 2022-09-12 DIAGNOSIS — E782 Mixed hyperlipidemia: Secondary | ICD-10-CM | POA: Diagnosis not present

## 2022-09-12 LAB — CBC WITH DIFFERENTIAL/PLATELET
Abs Immature Granulocytes: 0.05 10*3/uL (ref 0.00–0.07)
Basophils Absolute: 0.1 10*3/uL (ref 0.0–0.1)
Basophils Relative: 1 %
Eosinophils Absolute: 0.2 10*3/uL (ref 0.0–0.5)
Eosinophils Relative: 2 %
HCT: 40.5 % (ref 36.0–46.0)
Hemoglobin: 13.4 g/dL (ref 12.0–15.0)
Immature Granulocytes: 1 %
Lymphocytes Relative: 20 %
Lymphs Abs: 1.9 10*3/uL (ref 0.7–4.0)
MCH: 29.1 pg (ref 26.0–34.0)
MCHC: 33.1 g/dL (ref 30.0–36.0)
MCV: 87.9 fL (ref 80.0–100.0)
Monocytes Absolute: 0.9 10*3/uL (ref 0.1–1.0)
Monocytes Relative: 10 %
Neutro Abs: 6.2 10*3/uL (ref 1.7–7.7)
Neutrophils Relative %: 66 %
Platelets: 179 10*3/uL (ref 150–400)
RBC: 4.61 MIL/uL (ref 3.87–5.11)
RDW: 12.4 % (ref 11.5–15.5)
WBC: 9.2 10*3/uL (ref 4.0–10.5)
nRBC: 0 % (ref 0.0–0.2)

## 2022-09-12 LAB — FERRITIN: Ferritin: 165 ng/mL (ref 11–307)

## 2022-09-12 LAB — IRON AND TIBC
Iron: 68 ug/dL (ref 28–170)
Saturation Ratios: 20 % (ref 10.4–31.8)
TIBC: 333 ug/dL (ref 250–450)
UIBC: 265 ug/dL

## 2022-09-12 LAB — VITAMIN B12: Vitamin B-12: 475 pg/mL (ref 180–914)

## 2022-09-12 NOTE — Progress Notes (Signed)
[place]  [phone]) [phone] Fax:(336) [phone]  ID: Katie Buckley OB: 09-Jul-1950  MR#: [id number]  [id number]  Patient Care Team: Eugenia Pancoast, [place] as PCP - General (Family Medicine) Chauncey Mann, MD as Referring Physician (Psychiatry) Vladimir Crofts, MD as Consulting Physician (Neurology) Germaine Pomfret, [facility] (Pharmacist) Yolonda Kida, MD as Consulting Physician (Cardiology)   HPI: Katie Buckley is a 72 y.o. female with past medical history of aortic stenosis status post bioprosthetic valve in May 2023, Barrett's esophagus, anxiety, and skin cancer was referred to hematology for further work-up of worsening anemia.  Patient was treated with Feraheme x 2 doses with good response to iron.  She had colonoscopy with Dr. Vicente Males on 06/21/2022 which showed 5 mm polyp in the rectum otherwise was unremarkable.  Endoscopy done in June 2017 by Dr. Vira Agar showed esophageal mucosal changes secondary to established short segment Barrett's esophagus, erythema and mild inflammation in the gastric antrum.  H. pylori was negative.   INTERVAL HISTORY-  Patient was seen today as a post ER visit.    She presented to ED on 09/10/2022 for elevated blood pressure in 200.  She received 5 mg of amlodipine, 0.1 mg of clonidine and 1 mg of Ativan.  CBC showed acute decrease in platelet of 75,000.  Previously was normal.  She reports feeling better.  Feels tired.  Has been following closely with her cardiologist who has been readjusting her medications.  She also had couple of falls recently.  Was previously evaluated by neurology and was recommended Botox injections due to the concern that her eye drooping causing loss of proprioception.  REVIEW OF SYSTEMS:   Review of Systems  Constitutional:  Positive for malaise/fatigue. Negative for chills and fever.  Respiratory:  Negative for cough, hemoptysis and shortness of breath.   Gastrointestinal:  Negative for  abdominal pain, blood in stool, constipation, nausea and vomiting.  Musculoskeletal: Negative.   Neurological:  Negative for dizziness.    As per HPI. Otherwise, a complete review of systems is negative.  PAST MEDICAL HISTORY: 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   Diabetes mellitus without complication    Frequent sinus infections    Headache    Tension headaches   History of Barrett's esophagus    History of colon polyps    Hyperchloremia    MRSA (methicillin resistant staph aureus) culture positive 2018   history of, in hand   Palpitations    Seasonal allergies    Skin cancer    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    PAST SURGICAL HISTORY: Past Surgical History:  Procedure Laterality Date   ANTERIOR CERVICAL DECOMP/DISCECTOMY FUSION N/A 11/15/2020   Procedure: ANTERIOR CERVICAL DECOMPRESSION/DISCECTOMY FUSION 1 LEVEL C3/4;  Surgeon: Deetta Perla, MD;  Location: [facility];  Service: Neurosurgery;  Laterality: N/A;   AORTIC VALVE REPLACEMENT N/A 09/19/2021   Procedure: AORTIC VALVE REPLACEMENT Using 43mm Edwards Resilia Inspiris Valve;  Surgeon: Gaye Pollack, MD;  Location: [place];  Service: Open Heart Surgery;  Laterality: N/A;   AUGMENTATION MAMMAPLASTY Bilateral    breast implants   Bone Spur  2007   foot   CARDIAC CATHETERIZATION     Carpal Tunnel Symdrome  2008   as repeated in 2009   CATARACT EXTRACTION W/PHACO Left 01/16/2019   Procedure: CATARACT EXTRACTION PHACO AND INTRAOCULAR LENS PLACEMENT (IOC) LEFT DIABETES VISION BLUE;  Surgeon: Marchia Meiers, MD;  Location: [facility];  Service: Ophthalmology;  Laterality: Left;  [place]  00:47 CDE 6.62 Fluid pack lot # [id number]   CATARACT EXTRACTION W/PHACO Right 02/06/2019   Procedure: CATARACT EXTRACTION PHACO AND INTRAOCULAR LENS PLACEMENT (IOC);  Surgeon: Marchia Meiers, MD;  Location: [facility];  Service: Ophthalmology;  Laterality: Right;  [place]  00:54.6 CDE 6.52 FLUID PACK LOT # [id number]   CHOLECYSTECTOMY  2007   COLONOSCOPY WITH PROPOFOL N/A 11/17/2015   Procedure: COLONOSCOPY WITH PROPOFOL;  Surgeon: Manya Silvas, MD;  Location: [facility] END[facility]COPY;  Service: Endoscopy;  Laterality: N/A;   COLONOSCOPY WITH PROPOFOL N/A 06/21/2022   Procedure: COLONOSCOPY WITH PROPOFOL;  Surgeon: Jonathon Bellows, MD;  Location: [facility] ENDOSCOPY;  Service: Gastroenterology;  Laterality: N/A;   CORONARY ARTERY BYPASS GRAFT N/A 09/19/2021   Procedure: CORONARY ARTERY BYPASS GRAFTING X1 Using Left Internal Mammory artery;  Surgeon: Gaye Pollack, MD;  Location: [place];  Service: Open Heart Surgery;  Laterality: N/A;   CORONARY PRESSURE/FFR STUDY N/A 05/24/2021   Procedure: INTRAVASCULAR PRESSURE WIRE/FFR STUDY;  Surgeon: Yolonda Kida, MD;  Location: [place] CV LAB;  Service: Cardiovascular;  Laterality: N/A;   ESOPHAGOGASTRODUODENOSCOPY (EGD) WITH PROPOFOL N/A 11/17/2015   Procedure: ESOPHAGOGASTRODUODENOSCOPY (EGD) WITH PROPOFOL;  Surgeon: Manya Silvas, MD;  Location: [facility] ENDOSCOPY;  Service: Endoscopy;  Laterality: N/A;   MANDIBLE SURGERY     NASAL SINUS SURGERY  03/2013   Multile surgery Dr. Carlis Abbott. 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 Tamala Julian   RIGHT/LEFT HEART CATH AND CORONARY ANGIOGRAPHY N/A 05/24/2021   Procedure: RIGHT/LEFT HEART CATH AND CORONARY ANGIOGRAPHY;  Surgeon: Yolonda Kida, MD;  Location: [place] CV LAB;  Service: Cardiovascular;  Laterality: N/A;   TEE WITHOUT CARDIOVERSION N/A 09/19/2021   Procedure: TRANSESOPHAGEAL ECHOCARDIOGRAM (TEE);  Surgeon: Gaye Pollack, MD;  Location: [place];  Service: Open Heart Surgery;  Laterality: N/A;   TONSILLECTOMY  1960    FAMILY HISTORY: Family History  Problem Relation Age of Onset   Lung cancer Mother    Diabetes Father    Irritable bowel syndrome Sister    Throat cancer Brother    Breast cancer Daughter    Breast cancer  Maternal Aunt     HEALTH MAINTENANCE: Social History   Tobacco Use   Smoking status: Never   Smokeless tobacco: Never  Vaping Use   Vaping Use: Never used  Substance Use Topics   Alcohol use: No   Drug use: No     Allergies  Allergen Reactions   Doxycycline Other (See Comments)    Emotional changes/anxious   Biaxin [Clarithromycin]     GI upset, and bad taste in mouth.    Current Outpatient Medications  Medication Sig Dispense Refill   acetaminophen (TYLENOL) 650 MG CR tablet Take 1,300 mg by mouth every 8 (eight) hours as needed for pain (pain).     amLODipine (NORVASC) 5 MG tablet Take 1 tablet (5 mg total) by mouth daily. 30 tablet 1   apixaban (ELIQUIS) 5 MG TABS tablet Take 5 mg by mouth 2 (two) times daily.     buPROPion (WELLBUTRIN SR) 150 MG 12 hr tablet Take 150 mg by mouth 2 (two) times daily.     butalbital-acetaminophen-caffeine (FIORICET) 50-325-40 MG tablet Take 1 tablet by mouth every 6 (six) hours as needed for headache. 30 tablet 4   ezetimibe (ZETIA) 10 MG tablet Take 1 tablet (10 mg total)  by mouth at bedtime. 90 tablet 3   FLUoxetine (PROZAC) 40 MG capsule Take 40 mg by mouth daily.     glipiZIDE (GLUCOTROL) 5 MG tablet Take 1 tablet (5 mg total) by mouth daily. 90 tablet 3   irbesartan (AVAPRO) 75 MG tablet TAKE 1 TABLET BY MOUTH EVERY DAY 90 tablet 4   Lancet Devices MISC      metFORMIN (GLUCOPHAGE-XR) 500 MG 24 hr tablet TAKE 2 TABLETS BY MOUTH EVERY DAY 180 tablet 3   metoprolol tartrate (LOPRESSOR) 50 MG tablet Take 1 tablet (50 mg total) by mouth 2 (two) times daily. 60 tablet 2   ondansetron (ZOFRAN ODT) 4 MG disintegrating tablet Take 1 tablet (4 mg total) by mouth every 8 (eight) hours as needed for nausea or vomiting. 20 tablet 3   pregabalin (LYRICA) 75 MG capsule Take 1 capsule (75 mg total) by mouth 3 (three) times daily.     RABEprazole (ACIPHEX) 20 MG tablet TAKE 1 TABLET BY MOUTH EVERY DAY 90 tablet 4   rosuvastatin (CRESTOR) 20 MG tablet  TAKE 1 TABLET BY MOUTH EVERY DAY IN THE EVENING 90 tablet 4   simethicone (MYLICON) 80 MG chewable tablet Chew 80 mg by mouth every 6 (six) hours as needed for flatulence.     traZODone (DESYREL) 50 MG tablet Take 50-100 mg by mouth at bedtime as needed for sleep.     apixaban (ELIQUIS) 5 MG TABS tablet Take 1 tablet (5 mg total) by mouth 2 (two) times daily. 60 tablet 2   fexofenadine (ALLEGRA) 180 MG tablet Take 180 mg by mouth daily as needed for allergies or rhinitis. (Patient not taking: Reported on 09/12/2022)     methylPREDNISolone (MEDROL DOSEPAK) 4 MG TBPK tablet Take per package instructions (Patient not taking: Reported on 09/12/2022) 21 tablet 0   montelukast (SINGULAIR) 10 MG tablet Take 1 tablet (10 mg total) by mouth at bedtime as needed (allergies). Home med. (Patient not taking: Reported on 09/12/2022)     No current facility-administered medications for this visit.    OBJECTIVE: Vitals:   09/12/22 1138 09/12/22 1233  BP: (!) 195/58 (!) 171/61  Pulse: (!) 53   Resp: 17   Temp: 97.7 F (36.5 C)   SpO2: 100%       Body mass index is 27.31 kg/m.      General: Well-developed, well-nourished, no acute distress. Eyes: Pink conjunctiva, anicteric sclera. HEENT: Oropharynx clear Lungs: Clear to auscultation bilaterally. Heart: Regular rate and rhythm. No rubs, murmurs, or gallops. Abdomen: Soft, nontender, nondistended. No organomegaly noted, normoactive bowel sounds. Musculoskeletal: No edema, cyanosis, or clubbing. Neuro: Alert, answering all questions appropriately. Cranial nerves grossly intact. Skin: No rashes or petechiae noted. Psych: Normal affect. Lymphatics: Right-sided neck swelling has improved.  Continues to have palpable about 2 to 3 cm right cervical lymph node.  Tenderness improved.   LAB RESULTS:  Lab Results  Component Value Date   NA 135 09/10/2022   K 4.1 09/10/2022   CL 102 09/10/2022   CO2 26 09/10/2022   GLUCOSE 265 (H) 09/10/2022   BUN 18  09/10/2022   CREATININE 0.68 09/10/2022   CALCIUM 8.8 (L) 09/10/2022   PROT 5.8 (L) 09/10/2022   ALBUMIN 3.6 09/10/2022   AST 19 09/10/2022   ALT 20 09/10/2022   ALKPHOS 72 09/10/2022   BILITOT 0.6 09/10/2022   GFRNONAA >60 09/10/2022   GFRAA 89 11/19/2019    Lab Results  Component Value Date   WBC 9.2 09/12/2022  NEUTROABS 6.2 09/12/2022   HGB 13.4 09/12/2022   HCT 40.5 09/12/2022   MCV 87.9 09/12/2022   PLT 179 09/12/2022    Lab Results  Component Value Date   TIBC 333 09/12/2022   TIBC 322 06/15/2022   TIBC 298 04/21/2022   FERRITIN 165 09/12/2022   FERRITIN 310 (H) 06/15/2022   FERRITIN 404 (H) 04/21/2022   IRONPCTSAT 20 09/12/2022   IRONPCTSAT 23 06/15/2022   IRONPCTSAT 37 (H) 04/21/2022     STUDIES: No results found.  ASSESSMENT AND PLAN:   Katie Buckley is a 72 y.o. female with pmh of aortic stenosis status post bioprosthetic valve in May 2023, Barrett's esophagus, anxiety, and skin cancer follows with hematology for iron deficiency anemia.  # Thrombocytopenia -Transient ?  Could be related to elevated blood pressure.  She presented to ED on March 31 for blood pressure in 200s.  CBC at that time showed platelets of 79.  Previously it has been normal.  I repeated lab work today and her platelets have normalized.  No further workup is indicated at this time.  Discussed with the patient.  #Iron deficiency anemia -Completed 2 doses of IV Feraheme in October 2023.  Repeat iron panel showed good response to the treatment.  -s/p colonoscopy with Dr. Vicente Males in January 2024 which showed a 5 mm polyp in the rectum otherwise unremarkable. Endoscopy done in June 2017 by Dr. Vira Agar showed esophageal mucosal changes secondary to established short segment Barrett's esophagus, erythema and mild inflammation in the gastric antrum.  H. pylori was negative.   -Recently patient has been having worsening acid reflux.  She takes rabeprazole with mild improvement.  I will send  a staff message to Dr. Vicente Males about consideration of endoscopy with her worsening GERD and also recent history of iron deficiency.  #Hypertension-  per Cardiology  #Balance problems, falls - follows with [name], neurology.   RTC in 4 months for MD visit, labs, possible ferraheme  Patient expressed understanding and was in agreement with this plan. She also understands that She can call clinic at any time with any questions, concerns, or complaints.   I spent a total of 30 minutes reviewing chart data, face-to-face evaluation with the patient, counseling and coordination of care as detailed above.  Jane Canary, MD   09/12/2022 3:22 PM

## 2022-09-12 NOTE — Progress Notes (Signed)
Patient states she is foggy and having memory problems, she also had a visit to the ER last night due to elevated blood pressure.   She has fallen twice in  the last two months, once by losing her footing and the other falling off of a ladder.

## 2022-09-13 ENCOUNTER — Telehealth: Payer: Self-pay

## 2022-09-13 MED ORDER — RABEPRAZOLE SODIUM 20 MG PO TBEC: 20.0000 mg | DELAYED_RELEASE_TABLET | Freq: Two times a day (BID) | ORAL | 3 refills | Status: DC

## 2022-09-13 NOTE — Telephone Encounter (Signed)
Patient left a voicemail at 12:26pm saying she was returning a call and does not know what the call is about.

## 2022-09-13 NOTE — Telephone Encounter (Signed)
-----   Message from Jonathon Bellows, MD sent at 09/13/2022  9:06 AM EDT ----- Regarding: RE: assess for endoscopy Thanks Dr Gloriann Loan can we reach out to her and she will need a new patient appointment as we have not seen her at the office .   Can you also check and see if she is taking the Rabeprazole the right way and what dose   Kiran  ----- Message ----- From: Jane Canary, MD Sent: 09/13/2022   9:00 AM EDT To: Jonathon Bellows, MD Subject: assess for endoscopy                           Hello Dr. Vicente Males,   I am seeing her for IDA. She recently had colonoscopy which was unremarkable. Recently, her GERD symptoms are worsening. Is on Rabeprazole does not feel much improvement. Please evaluate need for endoscopy.   Thank you Bridget Hartshorn

## 2022-09-13 NOTE — Telephone Encounter (Signed)
Called patient and had to leave her a detailed message letting her know that she needed to take Rabeprazole 20 MG BID and that I was sending her new prescription to her pharmacy and that if she had any questions, to please call us back.

## 2022-09-13 NOTE — Telephone Encounter (Signed)
Called patient and asked her if she was still taking her Rabeprazole 20 MG and she stated that she was. I then asked her how often she was taking it and she stated that her PCP recommended for her to take it once a day, 30 minutes before breakfast. I also told her that I was calling to set up an appointment and she stated that she was just told that she was going to get scheduled for an EGD and I told her that we needed to see her first and then we would schedule her procedure. Patient at the end agreed and had no further questions.

## 2022-09-14 ENCOUNTER — Telehealth: Payer: Self-pay

## 2022-09-14 NOTE — Telephone Encounter (Signed)
        Patient  visited [place] on 3/31   Telephone encounter attempt : 1st   A HIPAA compliant voice message was left requesting a return call.  Instructed patient to call back .    [place] [phone] [address], [place], [place] [address] Phone: [phone] Email: [email or web address]

## 2022-09-15 ENCOUNTER — Telehealth: Payer: Self-pay

## 2022-09-15 NOTE — Telephone Encounter (Signed)
        Patient  visited  on 3/31   Telephone encounter attempt : 2nd   A HIPAA compliant voice message was left requesting a return call.  Instructed patient to call back .    Lauria Depoy [facility], [place] [phone] [address], [place], [place] [address] Phone: [phone] Email: [email or web address]       

## 2022-09-20 DIAGNOSIS — G4700 Insomnia, unspecified: Secondary | ICD-10-CM | POA: Diagnosis not present

## 2022-09-20 DIAGNOSIS — F411 Generalized anxiety disorder: Secondary | ICD-10-CM | POA: Diagnosis not present

## 2022-09-20 DIAGNOSIS — R41840 Attention and concentration deficit: Secondary | ICD-10-CM | POA: Diagnosis not present

## 2022-09-20 DIAGNOSIS — F331 Major depressive disorder, recurrent, moderate: Secondary | ICD-10-CM | POA: Diagnosis not present

## 2022-09-21 ENCOUNTER — Other Ambulatory Visit: Payer: Self-pay | Admitting: Family Medicine

## 2022-09-21 ENCOUNTER — Other Ambulatory Visit: Payer: Self-pay | Admitting: Family

## 2022-09-21 ENCOUNTER — Encounter: Payer: Self-pay | Admitting: Primary Care

## 2022-09-21 ENCOUNTER — Other Ambulatory Visit: Payer: Self-pay | Admitting: Physician Assistant

## 2022-09-21 ENCOUNTER — Ambulatory Visit (INDEPENDENT_AMBULATORY_CARE_PROVIDER_SITE_OTHER): Payer: PPO | Admitting: Primary Care

## 2022-09-21 VITALS — BP 120/74 | HR 65 | Temp 97.2°F | Ht 59.5 in | Wt 139.0 lb

## 2022-09-21 DIAGNOSIS — J309 Allergic rhinitis, unspecified: Secondary | ICD-10-CM | POA: Diagnosis not present

## 2022-09-21 DIAGNOSIS — J029 Acute pharyngitis, unspecified: Secondary | ICD-10-CM

## 2022-09-21 DIAGNOSIS — E1140 Type 2 diabetes mellitus with diabetic neuropathy, unspecified: Secondary | ICD-10-CM

## 2022-09-21 DIAGNOSIS — K219 Gastro-esophageal reflux disease without esophagitis: Secondary | ICD-10-CM

## 2022-09-21 DIAGNOSIS — B351 Tinea unguium: Secondary | ICD-10-CM

## 2022-09-21 LAB — POCT RAPID STREP A (OFFICE): Rapid Strep A Screen: NEGATIVE

## 2022-09-21 LAB — POC COVID19 BINAXNOW: SARS Coronavirus 2 Ag: NEGATIVE

## 2022-09-21 NOTE — Telephone Encounter (Signed)
Unable to refill per protocol, Rx expired. Discontinued.  Requested Prescriptions  Pending Prescriptions Disp Refills   glipiZIDE (GLUCOTROL) 10 MG tablet [Pharmacy Med Name: GLIPIZIDE 10 MG TABLET] 90 tablet 4    Sig: TAKE 1 TABLET BY MOUTH EVERY DAY BEFORE BREAKFAST     Endocrinology:  Diabetes - Sulfonylureas Failed - 09/21/2022 11:44 AM      Failed - HBA1C is between 0 and 7.9 and within 180 days    Hemoglobin A1C  Date Value Ref Range Status  07/13/2016 8.5  Final   Hgb A1c MFr Bld  Date Value Ref Range Status  03/08/2022 6.1 4.6 - 6.5 % Final    Comment:    Glycemic Control Guidelines for People with Diabetes:Non Diabetic:  <6%Goal of Therapy: <7%Additional Action Suggested:  >8%          Failed - Valid encounter within last 6 months    Recent Outpatient Visits           10 months ago Pneumonia due to infectious organism, unspecified laterality, unspecified part of lung   [place] [facility] Malva Limes, MD   11 months ago Nausea   [facility] Malva Limes, MD   11 months ago Status post aortic valve replacement with tissue valve   [facility] Malva Limes, MD   1 year ago Type 2 diabetes mellitus with diabetic neuropathy, without long-term current use of insulin (HCC)   [facility] Malva Limes, MD   1 year ago Uncontrolled type 2 diabetes mellitus with diabetic neuropathy, without long-term current use of insulin (HCC)   [facility] Malva Limes, MD              Passed - Cr in normal range and within 360 days    Creat  Date Value Ref Range Status  05/01/2017 0.61 0.50 - 0.99 mg/dL Final    Comment:    For patients >64 years of age, the reference limit for Creatinine is approximately 13% higher for people identified as African-American. .    Creatinine, Ser  Date Value Ref Range Status  09/10/2022 0.68 0.44 -  1.00 mg/dL Final   Creatinine,U  Date Value Ref Range Status  03/08/2022 68.7 mg/dL Final          valACYclovir (VALTREX) 1000 MG tablet [Pharmacy Med Name: VALACYCLOVIR HCL 1 GRAM TABLET] 90 tablet 1    Sig: TAKE 1 TABLET DAILY FOR 5 DAYS AS NEEDED     Antimicrobials:  Antiviral Agents - Anti-Herpetic Passed - 09/21/2022 11:44 AM      Passed - Valid encounter within last 12 months    Recent Outpatient Visits           10 months ago Pneumonia due to infectious organism, unspecified laterality, unspecified part of lung   [facility] Malva Limes, MD   11 months ago Nausea   [facility] Malva Limes, MD   11 months ago Status post aortic valve replacement with tissue valve   [facility] Malva Limes, MD   1 year ago Type 2 diabetes mellitus with diabetic neuropathy, without long-term current use of insulin [facility])   [facility] Malva Limes, MD   1 year ago Uncontrolled type 2 diabetes mellitus with diabetic neuropathy, without long-term current use of insulin (HCC)  [facility] Sherrie Mustache, Demetrios Isaacs, MD

## 2022-09-21 NOTE — Patient Instructions (Signed)
Resume your Singulair and Allegra allergy medications as prescribed.  Continue with the increased dose of Aciphex 20 mg twice daily.  It was a pleasure to see you today!

## 2022-09-21 NOTE — Assessment & Plan Note (Signed)
Exam/presentation today without evidence strep pharyngitis.  Rapid strep and Covid-19 tests negative today.  Suspect symptoms are a combination of allergies and uncontrolled GERD. Discussed with patient today.  Resume Singulair 10 mg HS and Allegra 180 mg daily. Continue with recently increased dose of Aciphex 20 mg BID.  Stop Afrin nasal spray.  Consider ENT referral.

## 2022-09-21 NOTE — Assessment & Plan Note (Signed)
Suspect symptoms today are secondary to allergy and GERD involvement. Discussed with patient today.  Resume Singulair 10 mg HS and Allegra 180 mg daily. Continue with increased dose of Aciphex 20 mg BID per GI.

## 2022-09-21 NOTE — Progress Notes (Signed)
Subjective:    Patient ID: Katie Buckley, female    DOB: Nov 15, 1950, 72 y.o.   MRN: [id number]  Sore Throat  Associated symptoms include ear pain. Pertinent negatives include no coughing.    [name] is a very pleasant 72 y.o. female patient of [name], NP with a history of hypertension, LVH, CHF, allergic rhinitis, asthma, recurrent sinusitis, GERD, type 2 diabetes, fatigue who presents today to discuss sore throat.   Symptom onset about 2 to 3 weeks ago with sore throat. She then developed chills, nausea, headaches, nasal congestion, dry cough, bilateral ear pain (left > right). Symptoms now include left ear pain, sore throat, dizziness, nausea. Over the last two days her sore throat has progressed.  She denies fevers. She is not taking Singulair or an antihistamine. She has been using Afirin nasal spray for the last three days. She is taking Coricidin. She continues to experience GERD symptoms with nausea and esophageal burning. She recently increased her Aciphex to 20 mg BID per GI.   Evaluated by PCP on 09/01/22 for same symptoms (sore throat and ear pain). In this note she was treated for a positive strep pharyngitis on 08/16/22 with Augmentin BID x 10 days by her PCP. During her visit on 09/01/22 throat culture collected and was treated with Cefdinir 300 mg BID x 5 days and Medrol dose pack. Throat culture returned negative.    Review of Systems  Constitutional:  Positive for fatigue. Negative for chills and fever.  HENT:  Positive for ear pain, postnasal drip and sore throat.   Respiratory:  Negative for cough.   Gastrointestinal:  Positive for nausea.         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    Social History   Socioeconomic History   Marital status: Married    Spouse name: Information systems manager, husband   Number of children: 1   Years of education: Not on file   Highest education level: Not on file  Occupational History    Comment: retired    Associate Professor: RETIRED  Tobacco Use   Smoking status: Never   Smokeless tobacco: Never  Vaping Use   Vaping Use: Never used  Substance and Sexual Activity   Alcohol use: No   Drug use: No   Sexual activity: Not Currently    Partners: Male    Birth control/protection: Post-menopausal  Other Topics Concern   Not on file  Social History Narrative   Not on file   Social Determinants of Health   Financial Resource Strain: Low Risk  (01/31/2022)   Overall Financial Resource Strain (CARDIA)    Difficulty of Paying Living Expenses: Not hard at all  Food Insecurity: No Food Insecurity (01/31/2022)   Hunger Vital Sign    Worried About Running Out of Food in the Last Year: Never true    Ran Out of Food in the Last Year: Never true  Transportation Needs: No Transportation Needs (  01/31/2022)   PRAPARE - Administrator, Civil ServiceTransportation    Lack of Transportation (Medical): No    Lack of Transportation (Non-Medical): No  Physical Activity: Inactive (01/31/2022)   Exercise Vital Sign    Days of Exercise per Week: 0 days    Minutes of Exercise per Session: 0 min  Stress: Stress Concern Present (01/31/2022)   [employer] - Occupational Stress Questionnaire    Feeling of Stress : To some extent  Social Connections: Moderately Isolated (01/31/2022)   Social Connection and Isolation Panel [NHANES]    Frequency of Communication with Friends and Family: Twice a week    Frequency of Social Gatherings with Friends and Family: Once a week    Attends Religious Services:  Never    Database administratorActive Member of Clubs or Organizations: No    Attends BankerClub or Organization Meetings: Never    Marital Status: Married  Catering managerntimate Partner Violence: Not At Risk (01/31/2022)   Humiliation, Afraid, Rape, and Kick questionnaire    Fear of Current or Ex-Partner: No    Emotionally Abused: No    Physically Abused: No    Sexually Abused: No    Past Surgical History:  Procedure Laterality Date   ANTERIOR CERVICAL DECOMP/DISCECTOMY FUSION N/A 11/15/2020   Procedure: ANTERIOR CERVICAL DECOMPRESSION/DISCECTOMY FUSION 1 LEVEL C3/4;  Surgeon: Lucy Chrisook, Steven, MD;  Location: [facility];  Service: Neurosurgery;  Laterality: N/A;   AORTIC VALVE REPLACEMENT N/A 09/19/2021   Procedure: AORTIC VALVE REPLACEMENT Using 19mm Edwards Resilia Inspiris Valve;  Surgeon: Alleen BorneBartle, Bryan K, MD;  Location: [facility] OR;  Service: Open Heart Surgery;  Laterality: N/A;   AUGMENTATION MAMMAPLASTY Bilateral    breast implants   Bone Spur  2007   foot   CARDIAC CATHETERIZATION     Carpal Tunnel Symdrome  2008   as repeated in 2009   CATARACT EXTRACTION W/PHACO Left 01/16/2019   Procedure: CATARACT EXTRACTION PHACO AND INTRAOCULAR LENS PLACEMENT (IOC) LEFT DIABETES VISION BLUE;  Surgeon: Elliot CousinHarrow, Brian, MD;  Location: [facility];  Service: Ophthalmology;  Laterality: Left;  US  00:47 CDE 6.62 Fluid pack lot # [id number]   CATARACT EXTRACTION W/PHACO Right 02/06/2019   Procedure: CATARACT EXTRACTION PHACO AND INTRAOCULAR LENS PLACEMENT (IOC);  Surgeon: Elliot CousinHarrow, Brian, MD;  Location: [facility];  Service: Ophthalmology;  Laterality: Right;  US 00:54.6 CDE 6.52 FLUID PACK LOT # [id number]   CHOLECYSTECTOMY  2007   COLONOSCOPY WITH PROPOFOL N/A 11/17/2015   Procedure: COLONOSCOPY WITH PROPOFOL;  Surgeon: Scot Junobert T Elliott, MD;  [facility];  Service: Endoscopy;  Laterality: N/A;   COLONOSCOPY WITH PROPOFOL N/A 06/21/2022   Procedure: COLONOSCOPY WITH PROPOFOL;  Surgeon: Wyline MoodAnna, Kiran, MD;  [facility];  Service:  Gastroenterology;  Laterality: N/A;   CORONARY ARTERY BYPASS GRAFT N/A 09/19/2021   Procedure: CORONARY ARTERY BYPASS GRAFTING X1 Using Left Internal Mammory artery;  Surgeon: Alleen BorneBartle, Bryan K, MD;  Location: [facility] OR;  Service: Open Heart Surgery;  Laterality: N/A;   CORONARY PRESSURE/FFR STUDY N/A 05/24/2021   Procedure: INTRAVASCULAR PRESSURE WIRE/FFR STUDY;  Surgeon: Alwyn Peaallwood, Dwayne D, MD;  Location: [facility];  Service: Cardiovascular;  Laterality: N/A;   ESOPHAGOGASTRODUODENOSCOPY (EGD) WITH PROPOFOL N/A 11/17/2015   Procedure: ESOPHAGOGASTRODUODENOSCOPY (EGD) WITH PROPOFOL;  Surgeon: Scot Junobert T Elliott, MD;  [facility];  Service: Endoscopy;  Laterality: N/A;   MANDIBLE SURGERY     NASAL SINUS SURGERY  03/2013   Multile surgery Dr. Chestine Sporelark. 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 Katrinka Blazing   RIGHT/LEFT HEART CATH AND CORONARY ANGIOGRAPHY N/A 05/24/2021   Procedure: RIGHT/LEFT HEART CATH AND CORONARY ANGIOGRAPHY;  Surgeon: Alwyn Pea, MD;  Location: [facility];  Service: Cardiovascular;  Laterality: N/A;   TEE WITHOUT CARDIOVERSION N/A 09/19/2021   Procedure: TRANSESOPHAGEAL ECHOCARDIOGRAM (TEE);  Surgeon: Alleen Borne, MD;  Location: [facility] OR;  Service: Open Heart Surgery;  Laterality: N/A;   TONSILLECTOMY  1960    Family History  Problem Relation Age of Onset   Lung cancer Mother    Diabetes Father    Irritable bowel syndrome Sister    Throat cancer Brother    Breast cancer Daughter    Breast cancer Maternal Aunt     Allergies  Allergen Reactions   Doxycycline Other (See Comments)    Emotional changes/anxious   Biaxin [Clarithromycin]     GI upset, and bad taste in mouth.    Current Outpatient Medications on File Prior to Visit  Medication Sig Dispense Refill   acetaminophen (TYLENOL) 650 MG CR tablet Take 1,300 mg by mouth every 8 (eight) hours as needed for pain (pain).     amLODipine (NORVASC)  5 MG tablet Take 1 tablet (5 mg total) by mouth daily. 30 tablet 1   apixaban (ELIQUIS) 5 MG TABS tablet Take 5 mg by mouth 2 (two) times daily.     buPROPion (WELLBUTRIN SR) 150 MG 12 hr tablet Take 150 mg by mouth 2 (two) times daily.     butalbital-acetaminophen-caffeine (FIORICET) 50-325-40 MG tablet Take 1 tablet by mouth every 6 (six) hours as needed for headache. 30 tablet 4   ezetimibe (ZETIA) 10 MG tablet Take 1 tablet (10 mg total) by mouth at bedtime. 90 tablet 3   FLUoxetine (PROZAC) 40 MG capsule Take 40 mg by mouth daily.     glipiZIDE (GLUCOTROL) 5 MG tablet Take 1 tablet (5 mg total) by mouth daily. 90 tablet 3   irbesartan (AVAPRO) 75 MG tablet TAKE 1 TABLET BY MOUTH EVERY DAY 90 tablet 4   Lancet Devices MISC      metFORMIN (GLUCOPHAGE-XR) 500 MG 24 hr tablet TAKE 2 TABLETS BY MOUTH EVERY DAY 180 tablet 3   metoprolol tartrate (LOPRESSOR) 50 MG tablet Take 1 tablet (50 mg total) by mouth 2 (two) times daily. 60 tablet 2   pregabalin (LYRICA) 75 MG capsule Take 1 capsule (75 mg total) by mouth 3 (three) times daily.     RABEprazole (ACIPHEX) 20 MG tablet Take 1 tablet (20 mg total) by mouth 2 times daily at 12 noon and 4 pm. 180 tablet 3   rosuvastatin (CRESTOR) 20 MG tablet TAKE 1 TABLET BY MOUTH EVERY DAY IN THE EVENING 90 tablet 4   simethicone (MYLICON) 80 MG chewable tablet Chew 80 mg by mouth every 6 (six) hours as needed for flatulence.     traZODone (DESYREL) 50 MG tablet Take 50-100 mg by mouth at bedtime as needed for sleep.     apixaban (ELIQUIS) 5 MG TABS tablet Take 1 tablet (5 mg total) by mouth 2 (two) times daily. 60 tablet 2   fexofenadine (ALLEGRA) 180 MG tablet Take 180 mg by mouth daily as needed for allergies or rhinitis. (Patient not taking: Reported on 09/12/2022)     montelukast (SINGULAIR) 10 MG tablet Take 1 tablet (10 mg total) by mouth at bedtime as needed (allergies). Home med. (Patient not taking: Reported on 09/12/2022)     ondansetron (ZOFRAN ODT) 4  MG  disintegrating tablet Take 1 tablet (4 mg total) by mouth every 8 (eight) hours as needed for nausea or vomiting. (Patient not taking: Reported on 09/21/2022) 20 tablet 3   No current facility-administered medications on file prior to visit.    BP 120/74   Pulse 65   Temp (!) 97.2 F (36.2 C) (Temporal)   Ht 4' 11.5" (1.511 m)   Wt 139 lb (63 kg)   SpO2 98%   BMI 27.60 kg/m  Objective:   Physical Exam Constitutional:      Appearance: She is not ill-appearing.  HENT:     Right Ear: Tympanic membrane and ear canal normal.     Left Ear: Tympanic membrane and ear canal normal.     Nose:     Right Sinus: No maxillary sinus tenderness or frontal sinus tenderness.     Left Sinus: No maxillary sinus tenderness or frontal sinus tenderness.     Mouth/Throat:     Pharynx: No posterior oropharyngeal erythema.  Eyes:     Conjunctiva/sclera: Conjunctivae normal.  Cardiovascular:     Rate and Rhythm: Normal rate and regular rhythm.  Pulmonary:     Effort: Pulmonary effort is normal.     Breath sounds: Normal breath sounds. No wheezing or rales.  Musculoskeletal:     Cervical back: Neck supple.  Lymphadenopathy:     Cervical: No cervical adenopathy.  Skin:    General: Skin is warm and dry.           Assessment & Plan:  Sore throat Assessment & Plan: Exam/presentation today without evidence strep pharyngitis.  Rapid strep and Covid-19 tests negative today.  Suspect symptoms are a combination of allergies and uncontrolled GERD. Discussed with patient today.  Resume Singulair 10 mg HS and Allegra 180 mg daily. Continue with recently increased dose of Aciphex 20 mg BID.  Stop Afrin nasal spray.  Consider ENT referral.    Allergic rhinitis, unspecified seasonality, unspecified trigger Assessment & Plan: Suspect symptoms today are secondary to allergy and GERD involvement. Discussed with patient today.  Resume Singulair 10 mg HS and Allegra 180 mg daily. Continue with  increased dose of Aciphex 20 mg BID per GI.   Orders: -     POC COVID-19 BinaxNow -     POCT rapid strep A  Gastroesophageal reflux disease without esophagitis Assessment & Plan: Appears uncontrolled.   Continue with increased dose of Aciphex 20 mg BID per GI.         Doreene Nest, NP

## 2022-09-21 NOTE — Assessment & Plan Note (Addendum)
Appears uncontrolled.   Continue with increased dose of Aciphex 20 mg BID per GI.

## 2022-09-22 DIAGNOSIS — F411 Generalized anxiety disorder: Secondary | ICD-10-CM | POA: Diagnosis not present

## 2022-09-22 DIAGNOSIS — F331 Major depressive disorder, recurrent, moderate: Secondary | ICD-10-CM | POA: Diagnosis not present

## 2022-09-26 DIAGNOSIS — G245 Blepharospasm: Secondary | ICD-10-CM | POA: Diagnosis not present

## 2022-09-27 ENCOUNTER — Other Ambulatory Visit: Payer: PPO

## 2022-09-27 ENCOUNTER — Ambulatory Visit: Payer: PPO | Admitting: Internal Medicine

## 2022-10-03 ENCOUNTER — Telehealth: Payer: Self-pay | Admitting: Plastic Surgery

## 2022-10-03 NOTE — Telephone Encounter (Signed)
Authorization form filled out and clinicals faxed to [employer].  Pending Auth.

## 2022-10-06 ENCOUNTER — Telehealth: Payer: Self-pay | Admitting: Plastic Surgery

## 2022-10-06 NOTE — Telephone Encounter (Signed)
LVM and my chart message notifing that insurance has approved coverage for surgical procedure. Approval number [id number] good from 4.23.24 to 7.22.24.

## 2022-10-10 DIAGNOSIS — R0602 Shortness of breath: Secondary | ICD-10-CM | POA: Diagnosis not present

## 2022-10-10 DIAGNOSIS — E1165 Type 2 diabetes mellitus with hyperglycemia: Secondary | ICD-10-CM | POA: Diagnosis not present

## 2022-10-10 DIAGNOSIS — I10 Essential (primary) hypertension: Secondary | ICD-10-CM | POA: Diagnosis not present

## 2022-10-10 DIAGNOSIS — I25118 Atherosclerotic heart disease of native coronary artery with other forms of angina pectoris: Secondary | ICD-10-CM | POA: Diagnosis not present

## 2022-10-10 DIAGNOSIS — F331 Major depressive disorder, recurrent, moderate: Secondary | ICD-10-CM | POA: Diagnosis not present

## 2022-10-10 DIAGNOSIS — Z952 Presence of prosthetic heart valve: Secondary | ICD-10-CM | POA: Diagnosis not present

## 2022-10-10 DIAGNOSIS — F411 Generalized anxiety disorder: Secondary | ICD-10-CM | POA: Diagnosis not present

## 2022-10-10 DIAGNOSIS — R011 Cardiac murmur, unspecified: Secondary | ICD-10-CM | POA: Diagnosis not present

## 2022-10-10 DIAGNOSIS — E782 Mixed hyperlipidemia: Secondary | ICD-10-CM | POA: Diagnosis not present

## 2022-10-10 DIAGNOSIS — E669 Obesity, unspecified: Secondary | ICD-10-CM | POA: Diagnosis not present

## 2022-10-10 DIAGNOSIS — I739 Peripheral vascular disease, unspecified: Secondary | ICD-10-CM | POA: Diagnosis not present

## 2022-10-10 DIAGNOSIS — K219 Gastro-esophageal reflux disease without esophagitis: Secondary | ICD-10-CM | POA: Diagnosis not present

## 2022-10-10 DIAGNOSIS — I350 Nonrheumatic aortic (valve) stenosis: Secondary | ICD-10-CM | POA: Diagnosis not present

## 2022-10-10 DIAGNOSIS — R002 Palpitations: Secondary | ICD-10-CM | POA: Diagnosis not present

## 2022-10-13 ENCOUNTER — Telehealth: Payer: Self-pay | Admitting: *Deleted

## 2022-10-13 NOTE — Telephone Encounter (Signed)
Surgery schedule mailed to patient 

## 2022-10-13 NOTE — Telephone Encounter (Signed)
Request for surgical clearance (cardiac and hold eliquis x 2 days pre-op) faxed to Dr. Dorothyann Peng fax #[phone] with confirmation received

## 2022-10-17 ENCOUNTER — Telehealth: Payer: Self-pay | Admitting: Family

## 2022-10-17 NOTE — Telephone Encounter (Signed)
Called patient to schedule Medicare Annual Wellness Visit (AWV). Left message for patient to call back and schedule Medicare Annual Wellness Visit (AWV).  Last date of AWV: 01/31/2022  Please schedule an appointment at any time with [facility].  If any questions, please contact me at [phone].  Thank you ,  Randon Goldsmith [facility] AWV TEAM Direct Dial: [phone]

## 2022-10-18 ENCOUNTER — Ambulatory Visit (INDEPENDENT_AMBULATORY_CARE_PROVIDER_SITE_OTHER): Payer: PPO | Admitting: Physician Assistant

## 2022-10-18 VITALS — BP 144/59 | HR 55

## 2022-10-18 DIAGNOSIS — T8543XA Leakage of breast prosthesis and implant, initial encounter: Secondary | ICD-10-CM

## 2022-10-18 MED ORDER — OXYCODONE-ACETAMINOPHEN 5-325 MG PO TABS: 1.0000 | ORAL_TABLET | Freq: Four times a day (QID) | ORAL | 0 refills | Status: DC | PRN

## 2022-10-18 MED ORDER — CEPHALEXIN 500 MG PO CAPS: 500.0000 mg | ORAL_CAPSULE | Freq: Three times a day (TID) | ORAL | 0 refills | Status: DC

## 2022-10-18 NOTE — H&P (View-Only) (Signed)
   Patient ID: Katie Buckley, female    DOB: 09/24/1950, 72 y.o.   MRN: [id number]  No chief complaint on file.   No diagnosis found.   History of Present Illness: Katie Buckley is a 72 y.o.  female  with a history of breast implants.  She presents for preoperative evaluation for upcoming procedure removal of bilateral breast implants with bilateral mastopexy by Dr. Dillingham on 10/30/2022  The patient has not had problems with anesthesia.  Patient does have a surgical history of cardiac valve repair as well as cardiac bypass.  Summary of Previous Visit: The patient was most recently seen in our office on 09/05/2022 by Dr. Dillingham.  The patient had bilateral breast implants placed when she was 18.  She thought they were saline, but she had a follow-up mammogram in 2023 that indicated they were silicone, her mammogram showed no signs of malignancy.  She had a right breast implant that had extracapsular silicone rupture, this was evidence on both her most recent mammogram as well as the 1 from 2016.  She notes she is followed by cardiology and is taking Eliquis/    Job: Retired  PMH Significant for: Aortic stenosis status post bioprosthetic TAVR in May 2023, type 2 diabetes   Past Medical History: Allergies: Allergies  Allergen Reactions   Doxycycline Other (See Comments)    Emotional changes/anxious   Biaxin [Clarithromycin]     GI upset, and bad taste in mouth.    Current Medications:  Current Outpatient Medications:    acetaminophen (TYLENOL) 650 MG CR tablet, Take 1,300 mg by mouth every 8 (eight) hours as needed for pain (pain)., Disp: , Rfl:    apixaban (ELIQUIS) 5 MG TABS tablet, Take 5 mg by mouth 2 (two) times daily., Disp: , Rfl:    buPROPion (WELLBUTRIN SR) 150 MG 12 hr tablet, Take 150 mg by mouth 2 (two) times daily., Disp: , Rfl:    butalbital-acetaminophen-caffeine (FIORICET) 50-325-40 MG tablet, Take 1 tablet by mouth every 6 (six) hours as needed for  headache., Disp: 30 tablet, Rfl: 4   ciclopirox (PENLAC) 8 % solution, APPLY TOPICALLY AT BEDTIME. APPLY OVER NAIL AND SURROUNDING SKIN. APPLY DAILY OVER PREVIOUS COAT. AFTER SEVEN (7) DAYS, MAY REMOVE WITH ALCOHOL AND CONTINUE CYCLE., Disp: 6.6 mL, Rfl: 0   ezetimibe (ZETIA) 10 MG tablet, Take 1 tablet (10 mg total) by mouth at bedtime., Disp: 90 tablet, Rfl: 3   fexofenadine (ALLEGRA) 180 MG tablet, Take 180 mg by mouth daily as needed for allergies or rhinitis., Disp: , Rfl:    FLUoxetine (PROZAC) 40 MG capsule, Take 40 mg by mouth daily., Disp: , Rfl:    glipiZIDE (GLUCOTROL) 5 MG tablet, Take 1 tablet (5 mg total) by mouth daily., Disp: 90 tablet, Rfl: 3   irbesartan (AVAPRO) 75 MG tablet, TAKE 1 TABLET BY MOUTH EVERY DAY, Disp: 90 tablet, Rfl: 4   Lancet Devices MISC, , Disp: , Rfl:    metFORMIN (GLUCOPHAGE-XR) 500 MG 24 hr tablet, TAKE 2 TABLETS BY MOUTH EVERY DAY, Disp: 180 tablet, Rfl: 3   metoprolol tartrate (LOPRESSOR) 50 MG tablet, Take 1 tablet (50 mg total) by mouth 2 (two) times daily., Disp: 60 tablet, Rfl: 2   montelukast (SINGULAIR) 10 MG tablet, Take 1 tablet (10 mg total) by mouth at bedtime as needed (allergies). Home med., Disp: , Rfl:    ondansetron (ZOFRAN ODT) 4 MG disintegrating tablet, Take 1 tablet (4 mg total) by mouth every   8 (eight) hours as needed for nausea or vomiting., Disp: 20 tablet, Rfl: 3   pregabalin (LYRICA) 75 MG capsule, Take 1 capsule (75 mg total) by mouth 3 (three) times daily., Disp: , Rfl:    RABEprazole (ACIPHEX) 20 MG tablet, Take 1 tablet (20 mg total) by mouth 2 times daily at 12 noon and 4 pm., Disp: 180 tablet, Rfl: 3   rosuvastatin (CRESTOR) 20 MG tablet, TAKE 1 TABLET BY MOUTH EVERY DAY IN THE EVENING, Disp: 90 tablet, Rfl: 4   simethicone (MYLICON) 80 MG chewable tablet, Chew 80 mg by mouth every 6 (six) hours as needed for flatulence., Disp: , Rfl:    traZODone (DESYREL) 50 MG tablet, Take 50-100 mg by mouth at bedtime as needed for sleep.,  Disp: , Rfl:    amLODipine (NORVASC) 5 MG tablet, Take 1 tablet (5 mg total) by mouth daily., Disp: 30 tablet, Rfl: 1   apixaban (ELIQUIS) 5 MG TABS tablet, Take 1 tablet (5 mg total) by mouth 2 (two) times daily., Disp: 60 tablet, Rfl: 2  Past Medical Problems: 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    Past Surgical History: Past Surgical History:  Procedure Laterality Date   ANTERIOR CERVICAL DECOMP/DISCECTOMY FUSION N/A 11/15/2020   Procedure: ANTERIOR CERVICAL DECOMPRESSION/DISCECTOMY FUSION 1 LEVEL C3/4;  Surgeon: Cook, Steven, MD;  Location: [facility];  Service: Neurosurgery;  Laterality: N/A;   AORTIC VALVE REPLACEMENT N/A 09/19/2021   Procedure: AORTIC VALVE REPLACEMENT Using 19mm Edwards Resilia Inspiris Valve;  Surgeon: Bartle, Bryan K, MD;  Location: [facility];  Service: Open Heart Surgery;  Laterality: N/A;   AUGMENTATION MAMMAPLASTY Bilateral    breast implants   Bone Spur  2007   foot   CARDIAC CATHETERIZATION     Carpal Tunnel Symdrome  2008   as repeated in 2009   CATARACT EXTRACTION W/PHACO Left 01/16/2019   Procedure: CATARACT EXTRACTION PHACO AND INTRAOCULAR LENS PLACEMENT (IOC) LEFT DIABETES VISION BLUE;  Surgeon: Harrow, Brian, MD;  Location: [facility];  Service: Ophthalmology;  Laterality: Left;  US  00:47 CDE 6.62 Fluid pack lot # [id number]   CATARACT EXTRACTION W/PHACO Right 02/06/2019   Procedure: CATARACT EXTRACTION PHACO AND INTRAOCULAR LENS PLACEMENT (IOC);  Surgeon: Harrow, Brian, MD;  Location: [facility];  Service:  Ophthalmology;  Laterality: Right;  US 00:54.6 CDE 6.52 FLUID PACK LOT # [id number]   CHOLECYSTECTOMY  2007   COLONOSCOPY WITH PROPOFOL N/A 11/17/2015   Procedure: COLONOSCOPY WITH PROPOFOL;  Surgeon: Robert T Elliott, MD;  Location: [facility];  Service: Endoscopy;  Laterality: N/A;   COLONOSCOPY WITH PROPOFOL N/A 06/21/2022   Procedure: COLONOSCOPY WITH PROPOFOL;  Surgeon: Anna, Kiran, MD;  Location: [facility];  Service: Gastroenterology;  Laterality: N/A;   CORONARY ARTERY BYPASS GRAFT N/A 09/19/2021   Procedure: CORONARY ARTERY BYPASS GRAFTING X1 Using Left Internal Mammory artery;  Surgeon: Bartle, Bryan K, MD;  Location: [facility];  Service: Open Heart Surgery;  Laterality:   N/A;   CORONARY PRESSURE/FFR STUDY N/A 05/24/2021   Procedure: INTRAVASCULAR PRESSURE WIRE/FFR STUDY;  Surgeon: Callwood, Dwayne D, MD;  Location: [facility];  Service: Cardiovascular;  Laterality: N/A;   ESOPHAGOGASTRODUODENOSCOPY (EGD) WITH PROPOFOL N/A 11/17/2015   Procedure: ESOPHAGOGASTRODUODENOSCOPY (EGD) WITH PROPOFOL;  Surgeon: Robert T Elliott, MD;  Location: [facility];  Service: Endoscopy;  Laterality: N/A;   MANDIBLE SURGERY     NASAL SINUS SURGERY  03/2013   Multile surgery Dr. Clark. 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 Smith   RIGHT/LEFT HEART CATH AND CORONARY ANGIOGRAPHY N/A 05/24/2021   Procedure: RIGHT/LEFT HEART CATH AND CORONARY ANGIOGRAPHY;  Surgeon: Callwood, Dwayne D, MD;  Location: [facility];  Service: Cardiovascular;  Laterality: N/A;   TEE WITHOUT CARDIOVERSION N/A 09/19/2021   Procedure: TRANSESOPHAGEAL ECHOCARDIOGRAM (TEE);  Surgeon: Bartle, Bryan K, MD;  Location: [facility];  Service: Open Heart Surgery;  Laterality: N/A;   TONSILLECTOMY  1960    Social History: Social History   Socioeconomic History   Marital status: Married    Spouse name: james, husband   Number of children: 1   Years of education: Not on  file   Highest education level: Not on file  Occupational History    Comment: retired    Employer: RETIRED  Tobacco Use   Smoking status: Never   Smokeless tobacco: Never  Vaping Use   Vaping Use: Never used  Substance and Sexual Activity   Alcohol use: No   Drug use: No   Sexual activity: Not Currently    Partners: Male    Birth control/protection: Post-menopausal  Other Topics Concern   Not on file  Social History Narrative   Not on file   Social Determinants of Health   Financial Resource Strain: Low Risk  (01/31/2022)   Overall Financial Resource Strain (CARDIA)    Difficulty of Paying Living Expenses: Not hard at all  Food Insecurity: No Food Insecurity (01/31/2022)   Hunger Vital Sign    Worried About Running Out of Food in the Last Year: Never true    Ran Out of Food in the Last Year: Never true  Transportation Needs: No Transportation Needs (01/31/2022)   PRAPARE - Transportation    Lack of Transportation (Medical): No    Lack of Transportation (Non-Medical): No  Physical Activity: Inactive (01/31/2022)   Exercise Vital Sign    Days of Exercise per Week: 0 days    Minutes of Exercise per Session: 0 min  Stress: Stress Concern Present (01/31/2022)   [employer] - Occupational Stress Questionnaire    Feeling of Stress : To some extent  Social Connections: Moderately Isolated (01/31/2022)   Social Connection and Isolation Panel [NHANES]    Frequency of Communication with Friends and Family: Twice a week    Frequency of Social Gatherings with Friends and Family: Once a week    Attends Religious Services: Never    Active Member of Clubs or Organizations: No    Attends Club or Organization Meetings: Never    Marital Status: Married  Intimate Partner Violence: Not At Risk (01/31/2022)   Humiliation, Afraid, Rape, and Kick questionnaire    Fear of Current or Ex-Partner: No    Emotionally Abused: No    Physically Abused: No    Sexually  Abused: No    Family History: Family History  Problem Relation Age of Onset   Lung cancer Mother      Diabetes Father    Irritable bowel syndrome Sister    Throat cancer Brother    Breast cancer Daughter    Breast cancer Maternal Aunt     Review of Systems: ROS  Physical Exam: Vital Signs BP (!) 144/59 (BP Location: Left Arm, Patient Position: Sitting, Cuff Size: Small)   Pulse (!) 55   SpO2 95%   Physical Exam  Constitutional:      General: Not in acute distress.    Appearance: Normal appearance. Not ill-appearing.  HENT:     Head: Normocephalic and atraumatic.  Eyes:     Pupils: Pupils are equal, round. Cardiovascular:     Rate and Rhythm: Normal rate. Pulmonary:     Effort: No respiratory distress or increased work of breathing.  Speaks in full sentences. Musculoskeletal: Normal range of motion. No lower extremity swelling or edema. No varicosities.  Skin:    General: Skin is warm and dry.     Findings: No erythema or rash.  Neurological:     Mental Status: Alert and oriented to person, place, and time.  Psychiatric:        Mood and Affect: Mood normal.        Behavior: Behavior normal.    Assessment/Plan: The patient is scheduled for removal of bilateral implants with mastopexy's with Dr. Dillingham.  Risks, benefits, and alternatives of procedure discussed, questions answered and consent obtained.    Smoking Status: Non-smoker Last Mammogram: May 2023; Results: Right-sided implant rupture, no signs of malignancy  Caprini Score: 4; Risk Factors include: Age, length of surgery; awaiting cardiac clearance, the patient is on Eliquis, we will restart her Eliquis postoperatively  Pictures obtained: Previous visit  Post-op Rx sent to pharmacy: Percocet, Keflex  Patient was provided with the General Surgical Risk consent document and Pain Medication Agreement prior to their appointment.  They had adequate time to read through the risk consent documents and Pain  Medication Agreement. We also discussed them in person together during this preop appointment. All of their questions were answered to their satisfaction.  Recommended calling if they have any further questions.  Risk consent form and Pain Medication Agreement to be scanned into patient's chart.   Awaiting cardiac clearance as of 10/18/2022 --- update---Received, pt cleared by cardiology with recommendations to hold Eliquis 5 days before and restart 2 days after. Pt aware.    Electronically signed by: Milly Goggins Todd Keijuan Schellhase, PA-C 10/18/2022 3:14 PM  

## 2022-10-18 NOTE — Progress Notes (Addendum)
Patient ID: Olene Floss, female    DOB: July 29, 1950, 72 y.o.   MRN: [id number]  No chief complaint on file.   No diagnosis found.   History of Present Illness: [name] is a 72 y.o.  female  with a history of breast implants.  She presents for preoperative evaluation for upcoming procedure removal of bilateral breast implants with bilateral mastopexy by Dr. Ulice Bold on 10/30/2022  The patient has not had problems with anesthesia.  Patient does have a surgical history of cardiac valve repair as well as cardiac bypass.  Summary of Previous Visit: The patient was most recently seen in our office on 09/05/2022 by Dr. Ulice Bold.  The patient had bilateral breast implants placed when she was 18.  She thought they were saline, but she had a follow-up mammogram in 2023 that indicated they were silicone, her mammogram showed no signs of malignancy.  She had a right breast implant that had extracapsular silicone rupture, this was evidence on both her most recent mammogram as well as the 1 from 2016.  She notes she is followed by cardiology and is taking Eliquis/    Job: Retired  [facility] Significant for: Aortic stenosis status post bioprosthetic TAVR in May 2023, type 2 diabetes   Past Medical History: Allergies: Allergies  Allergen Reactions   Doxycycline Other (See Comments)    Emotional changes/anxious   Biaxin [Clarithromycin]     GI upset, and bad taste in mouth.    Current Medications:  Current Outpatient Medications:    acetaminophen (TYLENOL) 650 MG CR tablet, Take 1,300 mg by mouth every 8 (eight) hours as needed for pain (pain)., Disp: , Rfl:    apixaban (ELIQUIS) 5 MG TABS tablet, Take 5 mg by mouth 2 (two) times daily., Disp: , Rfl:    buPROPion (WELLBUTRIN SR) 150 MG 12 hr tablet, Take 150 mg by mouth 2 (two) times daily., Disp: , Rfl:    butalbital-acetaminophen-caffeine (FIORICET) 50-325-40 MG tablet, Take 1 tablet by mouth every 6 (six) hours as needed for  headache., Disp: 30 tablet, Rfl: 4   ciclopirox (PENLAC) 8 % solution, APPLY TOPICALLY AT BEDTIME. APPLY OVER NAIL AND SURROUNDING SKIN. APPLY DAILY OVER PREVIOUS COAT. AFTER SEVEN (7) DAYS, MAY REMOVE WITH ALCOHOL AND CONTINUE CYCLE., Disp: 6.6 mL, Rfl: 0   ezetimibe (ZETIA) 10 MG tablet, Take 1 tablet (10 mg total) by mouth at bedtime., Disp: 90 tablet, Rfl: 3   fexofenadine (ALLEGRA) 180 MG tablet, Take 180 mg by mouth daily as needed for allergies or rhinitis., Disp: , Rfl:    FLUoxetine (PROZAC) 40 MG capsule, Take 40 mg by mouth daily., Disp: , Rfl:    glipiZIDE (GLUCOTROL) 5 MG tablet, Take 1 tablet (5 mg total) by mouth daily., Disp: 90 tablet, Rfl: 3   irbesartan (AVAPRO) 75 MG tablet, TAKE 1 TABLET BY MOUTH EVERY DAY, Disp: 90 tablet, Rfl: 4   Lancet Devices MISC, , Disp: , Rfl:    metFORMIN (GLUCOPHAGE-XR) 500 MG 24 hr tablet, TAKE 2 TABLETS BY MOUTH EVERY DAY, Disp: 180 tablet, Rfl: 3   metoprolol tartrate (LOPRESSOR) 50 MG tablet, Take 1 tablet (50 mg total) by mouth 2 (two) times daily., Disp: 60 tablet, Rfl: 2   montelukast (SINGULAIR) 10 MG tablet, Take 1 tablet (10 mg total) by mouth at bedtime as needed (allergies). Home med., Disp: , Rfl:    ondansetron (ZOFRAN ODT) 4 MG disintegrating tablet, Take 1 tablet (4 mg total) by mouth every  8 (eight) hours as needed for nausea or vomiting., Disp: 20 tablet, Rfl: 3   pregabalin (LYRICA) 75 MG capsule, Take 1 capsule (75 mg total) by mouth 3 (three) times daily., Disp: , Rfl:    RABEprazole (ACIPHEX) 20 MG tablet, Take 1 tablet (20 mg total) by mouth 2 times daily at 12 noon and 4 pm., Disp: 180 tablet, Rfl: 3   rosuvastatin (CRESTOR) 20 MG tablet, TAKE 1 TABLET BY MOUTH EVERY DAY IN THE EVENING, Disp: 90 tablet, Rfl: 4   simethicone (MYLICON) 80 MG chewable tablet, Chew 80 mg by mouth every 6 (six) hours as needed for flatulence., Disp: , Rfl:    traZODone (DESYREL) 50 MG tablet, Take 50-100 mg by mouth at bedtime as needed for sleep.,  Disp: , Rfl:    amLODipine (NORVASC) 5 MG tablet, Take 1 tablet (5 mg total) by mouth daily., Disp: 30 tablet, Rfl: 1   apixaban (ELIQUIS) 5 MG TABS tablet, Take 1 tablet (5 mg total) by mouth 2 (two) times daily., Disp: 60 tablet, Rfl: 2  Past Medical Problems: 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    Past Surgical History: Past Surgical History:  Procedure Laterality Date   ANTERIOR CERVICAL DECOMP/DISCECTOMY FUSION N/A 11/15/2020   Procedure: ANTERIOR CERVICAL DECOMPRESSION/DISCECTOMY FUSION 1 LEVEL C3/4;  Surgeon: Lucy Chris, MD;  Location: [facility] ORS;  Service: Neurosurgery;  Laterality: N/A;   AORTIC VALVE REPLACEMENT N/A 09/19/2021   Procedure: AORTIC VALVE REPLACEMENT Using 19mm Edwards Resilia Inspiris Valve;  Surgeon: Alleen Borne, MD;  [facility]: [facility] OR;  Service: Open Heart Surgery;  Laterality: N/A;   AUGMENTATION MAMMAPLASTY Bilateral    breast implants   Bone Spur  2007   foot   CARDIAC CATHETERIZATION     Carpal Tunnel Symdrome  2008   as repeated in 2009   CATARACT EXTRACTION W/PHACO Left 01/16/2019   Procedure: CATARACT EXTRACTION PHACO AND INTRAOCULAR LENS PLACEMENT (IOC) LEFT DIABETES VISION BLUE;  Surgeon: Elliot Cousin, MD;  Location: [facility] ORS;  Service: Ophthalmology;  Laterality: Left;  [place]  00:47 CDE 6.62 Fluid pack lot # [id number]   CATARACT EXTRACTION W/PHACO Right 02/06/2019   Procedure: CATARACT EXTRACTION PHACO AND INTRAOCULAR LENS PLACEMENT (IOC);  Surgeon: Elliot Cousin, MD;  Location: [facility] ORS;  Service:  Ophthalmology;  Laterality: Right;  [place] 00:54.6 CDE 6.52 FLUID PACK LOT # [id number]   CHOLECYSTECTOMY  2007   COLONOSCOPY WITH PROPOFOL N/A 11/17/2015   Procedure: COLONOSCOPY WITH PROPOFOL;  Surgeon: Scot Jun, MD;  Location: [facility] ENDOSCOPY;  Service: Endoscopy;  Laterality: N/A;   COLONOSCOPY WITH PROPOFOL N/A 06/21/2022   Procedure: COLONOSCOPY WITH PROPOFOL;  Surgeon: Wyline Mood, MD;  Location: [facility] ENDOSCOPY;  Service: Gastroenterology;  Laterality: N/A;   CORONARY ARTERY BYPASS GRAFT N/A 09/19/2021   Procedure: CORONARY ARTERY BYPASS GRAFTING X1 Using Left Internal Mammory artery;  Surgeon: Alleen Borne, MD;  [facility]: [facility] OR;  Service: Open Heart Surgery;  Laterality:  N/A;   CORONARY PRESSURE/FFR STUDY N/A 05/24/2021   Procedure: INTRAVASCULAR PRESSURE WIRE/FFR STUDY;  Surgeon: Alwyn Pea, MD;  Location: [facility] INVASIVE CV LAB;  Service: Cardiovascular;  Laterality: N/A;   ESOPHAGOGASTRODUODENOSCOPY (EGD) WITH PROPOFOL N/A 11/17/2015   Procedure: ESOPHAGOGASTRODUODENOSCOPY (EGD) WITH PROPOFOL;  Surgeon: Scot Jun, MD;  Location[facility] ENDOSCOPY;  Service: Endoscopy;  Laterality: N/A;   MANDIBLE SURGERY     NASAL SINUS SURGERY  03/2013   Multile surgery Dr. Chestine Spore. 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 Katrinka Blazing   RIGHT/LEFT HEART CATH AND CORONARY ANGIOGRAPHY N/A 05/24/2021   Procedure: RIGHT/LEFT HEART CATH AND CORONARY ANGIOGRAPHY;  Surgeon: Alwyn Pea, MD;  Location: [facility] INVASIVE CV LAB;  Service: Cardiovascular;  Laterality: N/A;   TEE WITHOUT CARDIOVERSION N/A 09/19/2021   Procedure: TRANSESOPHAGEAL ECHOCARDIOGRAM (TEE);  Surgeon: Alleen Borne, MD;  Location: [facility] OR;  Service: Open Heart Surgery;  Laterality: N/A;   TONSILLECTOMY  1960    Social History: Social History   Socioeconomic History   Marital status: Married    Spouse name: Information systems manager, husband   Number of children: 1   Years of education: Not on  file   Highest education level: Not on file  Occupational History    Comment: retired    Associate Professor: RETIRED  Tobacco Use   Smoking status: Never   Smokeless tobacco: Never  Vaping Use   Vaping Use: Never used  Substance and Sexual Activity   Alcohol use: No   Drug use: No   Sexual activity: Not Currently    Partners: Male    Birth control/protection: Post-menopausal  Other Topics Concern   Not on file  Social History Narrative   Not on file   Social Determinants of Health   Financial Resource Strain: Low Risk  (01/31/2022)   Overall Financial Resource Strain (CARDIA)    Difficulty of Paying Living Expenses: Not hard at all  Food Insecurity: No Food Insecurity (01/31/2022)   Hunger Vital Sign    Worried About Running Out of Food in the Last Year: Never true    Ran Out of Food in the Last Year: Never true  Transportation Needs: No Transportation Needs (01/31/2022)   PRAPARE - Transportation    Lack of Transportation (Medical): No    Lack of Transportation (Non-Medical): No  Physical Activity: Inactive (01/31/2022)   Exercise Vital Sign    Days of Exercise per Week: 0 days    Minutes of Exercise per Session: 0 min  Stress: Stress Concern Present (01/31/2022)   [employer] of Occupational Health - Occupational Stress Questionnaire    Feeling of Stress : To some extent  Social Connections: Moderately Isolated (01/31/2022)   Social Connection and Isolation Panel [NHANES]    Frequency of Communication with Friends and Family: Twice a week    Frequency of Social Gatherings with Friends and Family: Once a week    Attends Religious Services: Never    Database administrator or Organizations: No    Attends Banker Meetings: Never    Marital Status: Married  Catering manager Violence: Not At Risk (01/31/2022)   Humiliation, Afraid, Rape, and Kick questionnaire    Fear of Current or Ex-Partner: No    Emotionally Abused: No    Physically Abused: No    Sexually  Abused: No    Family History: Family History  Problem Relation Age of Onset   Lung cancer Mother  Diabetes Father    Irritable bowel syndrome Sister    Throat cancer Brother    Breast cancer Daughter    Breast cancer Maternal Aunt     Review of Systems: ROS  Physical Exam: Vital Signs BP (!) 144/59 (BP Location: Left Arm, Patient Position: Sitting, Cuff Size: Small)   Pulse (!) 55   SpO2 95%   Physical Exam  Constitutional:      General: Not in acute distress.    Appearance: Normal appearance. Not ill-appearing.  HENT:     Head: Normocephalic and atraumatic.  Eyes:     Pupils: Pupils are equal, round. Cardiovascular:     Rate and Rhythm: Normal rate. Pulmonary:     Effort: No respiratory distress or increased work of breathing.  Speaks in full sentences. Musculoskeletal: Normal range of motion. No lower extremity swelling or edema. No varicosities.  Skin:    General: Skin is warm and dry.     Findings: No erythema or rash.  Neurological:     Mental Status: Alert and oriented to person, place, and time.  Psychiatric:        Mood and Affect: Mood normal.        Behavior: Behavior normal.    Assessment/Plan: The patient is scheduled for removal of bilateral implants with mastopexy's with Dr. Ulice Bold.  Risks, benefits, and alternatives of procedure discussed, questions answered and consent obtained.    Smoking Status: Non-smoker Last Mammogram: May 2023; Results: Right-sided implant rupture, no signs of malignancy  Caprini Score: 4; Risk Factors include: Age, length of surgery; awaiting cardiac clearance, the patient is on Eliquis, we will restart her Eliquis postoperatively  Pictures obtained: Previous visit  Post-op Rx sent to pharmacy: Percocet, Keflex  Patient was provided with the General Surgical Risk consent document and Pain Medication Agreement prior to their appointment.  They had adequate time to read through the risk consent documents and Pain  Medication Agreement. We also discussed them in person together during this preop appointment. All of their questions were answered to their satisfaction.  Recommended calling if they have any further questions.  Risk consent form and Pain Medication Agreement to be scanned into patient's chart.   Awaiting cardiac clearance as of 10/18/2022 --- update---Received, pt cleared by cardiology with recommendations to hold Eliquis 5 days before and restart 2 days after. Pt aware.    Electronically signed by: Kelle Darting Shineka Auble, PA-C 10/18/2022 3:14 PM

## 2022-10-19 DIAGNOSIS — G245 Blepharospasm: Secondary | ICD-10-CM | POA: Diagnosis not present

## 2022-10-19 NOTE — Telephone Encounter (Signed)
Fax received from Dr. Juliann Pares: Pt is clear for surgery. Directions: Hold Eliquis 5 days before and restart 2 days after surgery

## 2022-10-20 ENCOUNTER — Telehealth: Payer: Self-pay | Admitting: Pharmacy Technician

## 2022-10-20 NOTE — Telephone Encounter (Signed)
Patient Advocate Encounter  Prior Authorization for Ciclopirox has been approved with [employer].    PA# [id number] Effective dates: 10/17/22 through 09/18/23

## 2022-10-23 ENCOUNTER — Ambulatory Visit (INDEPENDENT_AMBULATORY_CARE_PROVIDER_SITE_OTHER): Payer: PPO | Admitting: Gastroenterology

## 2022-10-23 ENCOUNTER — Other Ambulatory Visit: Payer: Self-pay

## 2022-10-23 ENCOUNTER — Encounter: Payer: Self-pay | Admitting: Gastroenterology

## 2022-10-23 VITALS — BP 155/71 | HR 52 | Temp 98.1°F | Ht 64.0 in | Wt 139.0 lb

## 2022-10-23 DIAGNOSIS — Z862 Personal history of diseases of the blood and blood-forming organs and certain disorders involving the immune mechanism: Secondary | ICD-10-CM | POA: Diagnosis not present

## 2022-10-23 DIAGNOSIS — K2100 Gastro-esophageal reflux disease with esophagitis, without bleeding: Secondary | ICD-10-CM

## 2022-10-23 NOTE — Progress Notes (Signed)
Katie Mood MD, MRCP(U.K) [address]  [address]  [place], [place] [address]  Main: [phone]  Fax: [phone]   Gastroenterology Consultation  Referring Provider:     Dr Alena Bills Primary Care Physician:  Mort Sawyers, FNP Primary Gastroenterologist:  Dr. Wyline Buckley  Reason for Consultation:     GERD        HPI:   Katie Buckley is a 72 y.o. y/o female referred for GERD by Dr Alena Bills.  She was recently seen by Dr. Alena Bills for anemia which she has received IV iron for.  She has been diagnosed with Barrett's esophagus by Dr. Mechele Collin back in 2017 not had a surveillance endoscopy since then.  Referred to see me for the same.  11/17/2015 EGD: 2 cm length salmon-colored mucosa seen biopsies were taken that showed reflux esophagitis but no evidence of intestinal metaplasia or Barrett's esophagus.  Lung 06/21/2022 colonoscopy: 5 mm polyp resected in the rectum sessile.  Hyperplastic 09/12/2022 hemoglobin 13.4 g MCV 87.9 ferritin 165 B12 normal.  She is taking rabeprazole 20 mg twice daily denies any discomfort.  She recently had heart surgery for her aortic valve.  Going for surgery on her breasts soon.  Denies any overt blood loss denies any dysphagia denies any heartburn. 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    Past Surgical History:  Procedure Laterality Date   ANTERIOR CERVICAL DECOMP/DISCECTOMY FUSION N/A 11/15/2020   Procedure: ANTERIOR CERVICAL DECOMPRESSION/DISCECTOMY FUSION 1 LEVEL C3/4;  Surgeon: Lucy Chris, MD;   Location: [facility] ORS;  Service: Neurosurgery;  Laterality: N/A;   AORTIC VALVE REPLACEMENT N/A 09/19/2021   Procedure: AORTIC VALVE REPLACEMENT Using 19mm Edwards Resilia Inspiris Valve;  Surgeon: Alleen Borne, MD;  Location: [facility] OR;  Service: Open Heart Surgery;  Laterality: N/A;   AUGMENTATION MAMMAPLASTY Bilateral    breast implants   Bone Spur  2007   foot   CARDIAC CATHETERIZATION     Carpal Tunnel Symdrome  2008   as repeated in 2009   CATARACT EXTRACTION W/PHACO Left 01/16/2019   Procedure: CATARACT EXTRACTION PHACO AND INTRAOCULAR LENS PLACEMENT (IOC) LEFT DIABETES VISION BLUE;  Surgeon: Elliot Cousin, MD;  Location: [facility] ORS;  Service: Ophthalmology;  Laterality: Left;  [place]  00:47 CDE 6.62 Fluid pack lot # [id number]   CATARACT EXTRACTION W/PHACO Right 02/06/2019   Procedure: CATARACT EXTRACTION PHACO AND INTRAOCULAR LENS PLACEMENT (IOC);  Surgeon: Elliot Cousin, MD;  Location: [facility] ORS;  Service: Ophthalmology;  Laterality: Right;  [place] 00:54.6 CDE 6.52 FLUID PACK LOT # [id number]   CHOLECYSTECTOMY  2007   COLONOSCOPY WITH PROPOFOL N/A 11/17/2015   Procedure: COLONOSCOPY WITH PROPOFOL;  Surgeon: Scot Jun, MD;  Location: [facility] ENDOSCOPY;  Service: Endoscopy;  Laterality: N/A;   COLONOSCOPY WITH PROPOFOL N/A 06/21/2022   Procedure: COLONOSCOPY WITH PROPOFOL;  Surgeon: Katie Mood, MD;  Location: [facility] ENDOSCOPY;  Service: Gastroenterology;  Laterality: N/A;   CORONARY ARTERY BYPASS GRAFT N/A  09/19/2021   Procedure: CORONARY ARTERY BYPASS GRAFTING X1 Using Left Internal Mammory artery;  Surgeon: Alleen Borne, MD;  Location: [facility] OR;  Service: Open Heart Surgery;  Laterality: N/A;   CORONARY PRESSURE/FFR STUDY N/A 05/24/2021   Procedure: INTRAVASCULAR PRESSURE WIRE/FFR STUDY;  Surgeon: Alwyn Pea, MD;  Location: [facility] INVASIVE CV LAB;  Service: Cardiovascular;  Laterality: N/A;   ESOPHAGOGASTRODUODENOSCOPY (EGD) WITH PROPOFOL N/A 11/17/2015   Procedure:  ESOPHAGOGASTRODUODENOSCOPY (EGD) WITH PROPOFOL;  Surgeon: Scot Jun, MD;  Location: [facility] ENDOSCOPY;  Service: Endoscopy;  Laterality: N/A;   MANDIBLE SURGERY     NASAL SINUS SURGERY  03/2013   Multile surgery Dr. Chestine Spore. 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 Katrinka Blazing   RIGHT/LEFT HEART CATH AND CORONARY ANGIOGRAPHY N/A 05/24/2021   Procedure: RIGHT/LEFT HEART CATH AND CORONARY ANGIOGRAPHY;  Surgeon: Alwyn Pea, MD;  Location: [facility] INVASIVE CV LAB;  Service: Cardiovascular;  Laterality: N/A;   TEE WITHOUT CARDIOVERSION N/A 09/19/2021   Procedure: TRANSESOPHAGEAL ECHOCARDIOGRAM (TEE);  Surgeon: Alleen Borne, MD;  Location: [facility] OR;  Service: Open Heart Surgery;  Laterality: N/A;   TONSILLECTOMY  1960    Prior to Admission medications   Medication Sig Start Date End Date Taking? Authorizing Provider  acetaminophen (TYLENOL) 650 MG CR tablet Take 1,300 mg by mouth every 8 (eight) hours as needed for pain (pain).    [provider]  amLODipine (NORVASC) 5 MG tablet Take 1 tablet (5 mg total) by mouth daily. Patient taking differently: Take 5 mg by mouth 2 (two) times daily. 09/10/22 10/19/22  Orvil Feil, PA-C  apixaban (ELIQUIS) 5 MG TABS tablet Take 1 tablet (5 mg total) by mouth 2 (two) times daily. 11/02/21 10/19/22  Darlin Priestly, MD  BIOTIN PO Take 1 tablet by mouth once a week.    [provider]  buPROPion (WELLBUTRIN SR) 150 MG 12 hr tablet Take 150 mg by mouth 2 (two) times daily. 05/25/20   [provider]  butalbital-acetaminophen-caffeine (FIORICET) 50-325-40 MG tablet Take 1 tablet by mouth every 6 (six) hours as needed for headache. 07/31/22   Mort Sawyers, FNP  cephALEXin (KEFLEX) 500 MG capsule Take 1 capsule (500 mg total) by mouth 3 (three) times daily. 10/18/22   Hedges, Tinnie Gens, PA-C  ezetimibe (ZETIA) 10 MG tablet Take 1 tablet (10 mg total) by mouth at bedtime. 07/31/22 07/26/23  Mort Sawyers, FNP   fexofenadine (ALLEGRA) 180 MG tablet Take 180 mg by mouth daily as needed for allergies or rhinitis.    [provider]  FLUoxetine (PROZAC) 40 MG capsule Take 40 mg by mouth daily.    [provider]  fluticasone (FLONASE) 50 MCG/ACT nasal spray Place 1 spray into both nostrils daily as needed for allergies or rhinitis.    [provider]  glipiZIDE (GLUCOTROL) 5 MG tablet Take 1 tablet (5 mg total) by mouth daily. 09/11/22   Mort Sawyers, FNP  irbesartan (AVAPRO) 75 MG tablet TAKE 1 TABLET BY MOUTH EVERY DAY 12/25/21   Malva Limes, MD  Lancet Devices MISC     [provider]  losartan (COZAAR) 50 MG tablet Take 50 mg by mouth daily.    [provider]  metFORMIN (GLUCOPHAGE-XR) 500 MG 24 hr tablet TAKE 2 TABLETS BY MOUTH EVERY DAY 11/07/21   Malva Limes, MD  metoprolol tartrate (LOPRESSOR) 50 MG tablet Take 1 tablet (50 mg total) by mouth 2 (two) times daily.  09/24/21   Ardelle Balls, PA-C  montelukast (SINGULAIR) 10 MG tablet Take 1 tablet (10 mg total) by mouth at bedtime as needed (allergies). Home med. 11/02/21   Darlin Priestly, MD  ondansetron (ZOFRAN ODT) 4 MG disintegrating tablet Take 1 tablet (4 mg total) by mouth every 8 (eight) hours as needed for nausea or vomiting. 03/29/22   Malva Limes, MD  oxyCODONE-acetaminophen (PERCOCET/ROXICET) 5-325 MG tablet Take 1 tablet by mouth every 6 (six) hours as needed for severe pain. 10/18/22   Hedges, Tinnie Gens, PA-C  pregabalin (LYRICA) 75 MG capsule Take 1 capsule (75 mg total) by mouth 3 (three) times daily. 04/19/22   Mort Sawyers, FNP  RABEprazole (ACIPHEX) 20 MG tablet Take 1 tablet (20 mg total) by mouth 2 times daily at 12 noon and 4 pm. 09/13/22   Katie Mood, MD  rosuvastatin (CRESTOR) 20 MG tablet TAKE 1 TABLET BY MOUTH EVERY DAY IN THE EVENING 08/25/22   Mort Sawyers, FNP  simethicone (MYLICON) 80 MG chewable tablet Chew 80 mg by mouth every 6 (six) hours as needed for flatulence.     [provider]  traZODone (DESYREL) 50 MG tablet Take 50-100 mg by mouth at bedtime. 04/10/22   [provider]    Family History  Problem Relation Age of Onset   Lung cancer Mother    Diabetes Father    Irritable bowel syndrome Sister    Throat cancer Brother    Breast cancer Daughter    Breast cancer Maternal Aunt      Social History   Tobacco Use   Smoking status: Never   Smokeless tobacco: Never  Vaping Use   Vaping Use: Never used  Substance Use Topics   Alcohol use: No   Drug use: No    Allergies as of 10/23/2022 - Review Complete 10/23/2022  Allergen Reaction Noted   Doxycycline Other (See Comments) 01/08/2018   Biaxin [clarithromycin]  12/16/2014    Review of Systems:    All systems reviewed and negative except where noted in HPI.   Physical Exam:  BP (!) 167/61   Pulse (!) 53   Temp 98.1 F (36.7 C) (Oral)   Ht 5\' 4"  (1.626 m)   Wt 139 lb (63 kg)   BMI 23.86 kg/m  No LMP recorded. Patient is postmenopausal. Psych:  Alert and cooperative. Normal Buckley and affect. General:   Alert,  Well-developed, well-nourished, pleasant and cooperative in NAD Head:  Normocephalic and atraumatic. Eyes:  Sclera clear, no icterus.   Conjunctiva pink. Ears:  Normal auditory acuity.    Neurologic:  Alert and oriented x3;  grossly normal neurologically. Psych:  Alert and cooperative. Normal Buckley and affect.  Imaging Studies: No results found.  Assessment and Plan:   [name] is a 72 y.o. y/o female has been referred for standing history of reflux.  There was some consideration of Barrett's esophagus in 2017 on endoscopy but the biopsies only showed reflux esophagitis and there was no evidence of Barrett's esophagus.  She sees Dr. Alena Bills in hematology for iron deficiency anemia.  She has a history of aortic stenosis.  Treated for iron deficiency anemia which has resolved with IV iron.  Differentials for anemia could include blood loss from  surgery versus heydes syndrome related to aortic stenosis which usually resolves after valve replacement.  Plan 1.  Continue PPI 2.  EGD to evaluate esophagus, on Eliquis will require cardiac clearance 3.  There is no evidence of Barrett's esophagus  on biopsy seen previously   I have discussed alternative options, risks & benefits,  which include, but are not limited to, bleeding, infection, perforation,respiratory complication & drug reaction.  The patient agrees with this plan & written consent will be obtained.     Follow up in as needed  Dr Katie Mood MD,MRCP(U.K)

## 2022-10-23 NOTE — Pre-Procedure Instructions (Signed)
Surgical Instructions    Your procedure is scheduled on Oct 30, 2022.  Report to [facility] Main Entrance "A" at 10:30 A.M., then check in with the Admitting office.  Call this number if you have problems the morning of surgery:  [phone]  If you have any questions prior to your surgery date call [phone]: Open Monday-Friday 8am-4pm If you experience any cold or flu symptoms such as cough, fever, chills, shortness of breath, etc. between now and your scheduled surgery, please notify us at the above number.     Remember:  Do not eat or drink after midnight the night before your surgery      Take these medicines the morning of surgery with A SIP OF WATER:  amLODipine (NORVASC)   buPROPion (WELLBUTRIN SR)   cephALEXin (KEFLEX)   FLUoxetine (PROZAC)   metoprolol tartrate (LOPRESSOR)   pregabalin (LYRICA)   RABEprazole (ACIPHEX)    May take these medicines IF NEEDED:  acetaminophen (TYLENOL)   butalbital-acetaminophen-caffeine (FIORICET)   fexofenadine (ALLEGRA)   fluticasone (FLONASE) nasal spray   ondansetron (ZOFRAN ODT)   oxyCODONE-acetaminophen (PERCOCET/ROXICET)   simethicone (MYLICON)    Follow your surgeon's instructions on when to stop apixaban (ELIQUIS).  If no instructions were given by your surgeon then you will need to call the office to get those instructions.     As of today, STOP taking any Aspirin (unless otherwise instructed by your surgeon) Aleve, Naproxen, Ibuprofen, Motrin, Advil, Goody's, BC's, all herbal medications, fish oil, and all vitamins.   WHAT DO I DO ABOUT MY DIABETES MEDICATION?   Do not take metFORMIN (GLUCOPHAGE-XR) the morning of surgery.  Do not take glipiZIDE (GLUCOTROL)  the evening before surgery OR the morning of surgery.   HOW TO MANAGE YOUR DIABETES BEFORE AND AFTER SURGERY  Why is it important to control my blood sugar before and after surgery? Improving blood sugar levels before and after surgery helps healing and  can limit problems. A way of improving blood sugar control is eating a healthy diet by:  Eating less sugar and carbohydrates  Increasing activity/exercise  Talking with your doctor about reaching your blood sugar goals High blood sugars (greater than 180 mg/dL) can raise your risk of infections and slow your recovery, so you will need to focus on controlling your diabetes during the weeks before surgery. Make sure that the doctor who takes care of your diabetes knows about your planned surgery including the date and location.  How do I manage my blood sugar before surgery? Check your blood sugar at least 4 times a day, starting 2 days before surgery, to make sure that the level is not too high or low.  Check your blood sugar the morning of your surgery when you wake up and every 2 hours until you get to the Short Stay unit.  If your blood sugar is less than 70 mg/dL, you will need to treat for low blood sugar: Do not take insulin. Treat a low blood sugar (less than 70 mg/dL) with  cup of clear juice (cranberry or apple), 4 glucose tablets, OR glucose gel. Recheck blood sugar in 15 minutes after treatment (to make sure it is greater than 70 mg/dL). If your blood sugar is not greater than 70 mg/dL on recheck, call [phone] for further instructions. Report your blood sugar to the short stay nurse when you get to Short Stay.  If you are admitted to the hospital after surgery: Your blood sugar will be checked by the  staff and you will probably be given insulin after surgery (instead of oral diabetes medicines) to make sure you have good blood sugar levels. The goal for blood sugar control after surgery is 80-180 mg/dL.                      Do NOT Smoke (Tobacco/Vaping) for 24 hours prior to your procedure.  If you use a CPAP at night, you may bring your mask/headgear for your overnight stay.   Contacts, glasses, piercing's, hearing aid's, dentures or partials may not be worn into  surgery, please bring cases for these belongings.    For patients admitted to the hospital, discharge time will be determined by your treatment team.   Patients discharged the day of surgery will not be allowed to drive home, and someone needs to stay with them for 24 hours.  SURGICAL WAITING ROOM VISITATION Patients having surgery or a procedure may have no more than 2 support people in the waiting area - these visitors may rotate.   Children under the age of 72 must have an adult with them who is not the patient. If the patient needs to stay at the hospital during part of their recovery, the visitor guidelines for inpatient rooms apply. Pre-op nurse will coordinate an appropriate time for 1 support person to accompany patient in pre-op.  This support person may not rotate.   Please refer to the [facility] website for the visitor guidelines for Inpatients (after your surgery is over and you are in a regular room).    Special instructions:   [facility]- Preparing For Surgery  Before surgery, you can play an important role. Because skin is not sterile, your skin needs to be as free of germs as possible. You can reduce the number of germs on your skin by washing with CHG (chlorahexidine gluconate) Soap before surgery.  CHG is an antiseptic cleaner which kills germs and bonds with the skin to continue killing germs even after washing.    Oral Hygiene is also important to reduce your risk of infection.  Remember - BRUSH YOUR TEETH THE MORNING OF SURGERY WITH YOUR REGULAR TOOTHPASTE  Please do not use if you have an allergy to CHG or antibacterial soaps. If your skin becomes reddened/irritated stop using the CHG.  Do not shave (including legs and underarms) for at least 48 hours prior to first CHG shower. It is OK to shave your face.  Please follow these instructions carefully.   Shower the NIGHT BEFORE SURGERY and the MORNING OF SURGERY  If you chose to wash your hair, wash your hair first  as usual with your normal shampoo.  After you shampoo, rinse your hair and body thoroughly to remove the shampoo.  Use CHG Soap as you would any other liquid soap. You can apply CHG directly to the skin and wash gently with a scrungie or a clean washcloth.   Apply the CHG Soap to your body ONLY FROM THE NECK DOWN.  Do not use on open wounds or open sores. Avoid contact with your eyes, ears, mouth and genitals (private parts). Wash Face and genitals (private parts)  with your normal soap.   Wash thoroughly, paying special attention to the area where your surgery will be performed.  Thoroughly rinse your body with warm water from the neck down.  DO NOT shower/wash with your normal soap after using and rinsing off the CHG Soap.  Pat yourself dry with a CLEAN TOWEL.  Wear CLEAN PAJAMAS to bed the night before surgery  Place CLEAN SHEETS on your bed the night before your surgery  DO NOT SLEEP WITH PETS.   Day of Surgery: Take a shower with CHG soap. Do not wear jewelry or makeup Do not wear lotions, powders, perfumes/colognes, or deodorant. Do not shave 48 hours prior to surgery.  Men may shave face and neck. Do not bring valuables to the hospital.  [facility] is not responsible for any belongings or valuables. Do not wear nail polish, gel polish, artificial nails, or any other type of covering on natural nails (fingers and toes) If you have artificial nails or gel coating that need to be removed by a nail salon, please have this removed prior to surgery. Artificial nails or gel coating may interfere with anesthesia's ability to adequately monitor your vital signs.  Wear Clean/Comfortable clothing the morning of surgery Remember to brush your teeth WITH YOUR REGULAR TOOTHPASTE.   Please read over the following fact sheets that you were given.    If you received a COVID test during your pre-op visit  it is requested that you wear a mask when out in public, stay away from anyone  that may not be feeling well and notify your surgeon if you develop symptoms. If you have been in contact with anyone that has tested positive in the last 10 days please notify you surgeon.

## 2022-10-24 ENCOUNTER — Encounter (HOSPITAL_COMMUNITY)
Admission: RE | Admit: 2022-10-24 | Discharge: 2022-10-24 | Disposition: A | Discharge status: Home / Self Care | Payer: PPO | Source: Ambulatory Visit | Attending: Plastic Surgery | Admitting: Plastic Surgery

## 2022-10-24 ENCOUNTER — Encounter: Payer: Self-pay | Admitting: Plastic Surgery

## 2022-10-24 ENCOUNTER — Other Ambulatory Visit: Payer: Self-pay

## 2022-10-24 ENCOUNTER — Encounter (HOSPITAL_COMMUNITY): Payer: Self-pay

## 2022-10-24 VITALS — BP 169/60 | HR 63 | Temp 98.2°F | Resp 16 | Ht 60.0 in | Wt 140.0 lb

## 2022-10-24 DIAGNOSIS — I5032 Chronic diastolic (congestive) heart failure: Secondary | ICD-10-CM | POA: Insufficient documentation

## 2022-10-24 DIAGNOSIS — R001 Bradycardia, unspecified: Secondary | ICD-10-CM | POA: Insufficient documentation

## 2022-10-24 DIAGNOSIS — Z01818 Encounter for other preprocedural examination: Secondary | ICD-10-CM | POA: Diagnosis not present

## 2022-10-24 DIAGNOSIS — Z951 Presence of aortocoronary bypass graft: Secondary | ICD-10-CM | POA: Diagnosis not present

## 2022-10-24 DIAGNOSIS — I480 Paroxysmal atrial fibrillation: Secondary | ICD-10-CM | POA: Diagnosis not present

## 2022-10-24 DIAGNOSIS — I491 Atrial premature depolarization: Secondary | ICD-10-CM | POA: Insufficient documentation

## 2022-10-24 DIAGNOSIS — I2510 Atherosclerotic heart disease of native coronary artery without angina pectoris: Secondary | ICD-10-CM | POA: Insufficient documentation

## 2022-10-24 DIAGNOSIS — I110 Hypertensive heart disease with heart failure: Secondary | ICD-10-CM | POA: Insufficient documentation

## 2022-10-24 DIAGNOSIS — E119 Type 2 diabetes mellitus without complications: Secondary | ICD-10-CM | POA: Insufficient documentation

## 2022-10-24 HISTORY — DX: Unspecified osteoarthritis, unspecified site: M19.90

## 2022-10-24 HISTORY — DX: Type 2 diabetes mellitus with unspecified diabetic retinopathy without macular edema: E11.319

## 2022-10-24 HISTORY — DX: Type 2 diabetes mellitus with diabetic neuropathy, unspecified: E11.40

## 2022-10-24 HISTORY — DX: Pneumonia, unspecified organism: J18.9

## 2022-10-24 HISTORY — DX: Atherosclerotic heart disease of native coronary artery without angina pectoris: I25.10

## 2022-10-24 LAB — CBC
HCT: 39 % (ref 36.0–46.0)
Hemoglobin: 12.7 g/dL (ref 12.0–15.0)
MCH: 28.3 pg (ref 26.0–34.0)
MCHC: 32.6 g/dL (ref 30.0–36.0)
MCV: 86.9 fL (ref 80.0–100.0)
Platelets: 170 10*3/uL (ref 150–400)
RBC: 4.49 MIL/uL (ref 3.87–5.11)
RDW: 12.4 % (ref 11.5–15.5)
WBC: 8.7 10*3/uL (ref 4.0–10.5)
nRBC: 0 % (ref 0.0–0.2)

## 2022-10-24 LAB — BASIC METABOLIC PANEL
Anion gap: 7 (ref 5–15)
BUN: 16 mg/dL (ref 8–23)
CO2: 27 mmol/L (ref 22–32)
Calcium: 9.4 mg/dL (ref 8.9–10.3)
Chloride: 102 mmol/L (ref 98–111)
Creatinine, Ser: 0.78 mg/dL (ref 0.44–1.00)
GFR, Estimated: >60 mL/min (ref >60)
Glucose, Bld: 145 mg/dL — ABNORMAL HIGH (ref 70–99)
Potassium: 4.2 mmol/L (ref 3.5–5.1)
Sodium: 136 mmol/L (ref 135–145)

## 2022-10-24 LAB — GLUCOSE, CAPILLARY: Glucose-Capillary: 165 mg/dL — ABNORMAL HIGH (ref 70–99)

## 2022-10-24 LAB — HEMOGLOBIN A1C
Hgb A1c MFr Bld: 8.1 % — ABNORMAL HIGH (ref 4.8–5.6)
Mean Plasma Glucose: 185.77 mg/dL

## 2022-10-24 LAB — SURGICAL PCR SCREEN
MRSA, PCR: NEGATIVE
Staphylococcus aureus: NEGATIVE

## 2022-10-24 NOTE — Progress Notes (Signed)
PCP - Mort Sawyers, FNP Cardiologist - Dr. Dorothyann Peng  PPM/ICD - Denies Device Orders - n/a Rep Notified - n/a  Chest x-ray - 06/15/2022 EKG - 10/24/2022 Stress Test - 02/28/2011 ECHO - 10/27/2021 Cardiac Cath - 05/24/2021  Sleep Study - Denies CPAP - n/a  Pt is DM2. She checks her blood sugar 2x/day. Normal fasting range is around 130. CBG at pre-op appointment 165. For lunch, pt had a BBQ Sandwich and water. A1c result pending.  Last dose of GLP1 agonist- n/a GLP1 instructions: n/a  Blood Thinner Instructions: Per patient, her last dose of her Eliquis will be today, May 14th. Aspirin Instructions: n/a  NPO after midnight  COVID TEST- n/a   Anesthesia review: Yes. Cardiac Clearance obtained. A1c abnormal result - 8.1  Patient denies shortness of breath, fever, cough and chest pain at PAT appointment. Pt does endorse symptoms related to seasonal allergies. Pt instructed to let us know if she develops any additional symptoms such as sore throat, fever, productive/non-productive cough.   All instructions explained to the patient, with a verbal understanding of the material. Patient agrees to go over the instructions while at home for a better understanding. Patient also instructed to self quarantine after being tested for COVID-19. The opportunity to ask questions was provided.

## 2022-10-25 MED ORDER — OXYCODONE-ACETAMINOPHEN 5-325 MG PO TABS: 1.0000 | ORAL_TABLET | Freq: Four times a day (QID) | ORAL | 0 refills | Status: DC | PRN

## 2022-10-25 NOTE — Progress Notes (Signed)
Anesthesia Chart Review:  Follows with cardiologist Dr. 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  Patient had paroxysmal A-fib s/p CABG and was started on Eliquis.  Last seen 10/10/2022.  Antihypertensives were titrated at that time due to elevated blood pressure, amlodipine increased to 5 mg twice daily and losartan increased to 50 mg twice daily.  Otherwise stable, no changes to management.  Dr. Juliann Pares cleared the patient for surgery, advised she can hold Eliquis for 5 days prior to procedure.  Copy of clearance scanned under media tab 10/20/2022.  Patient reported last dose Eliquis 10/24/2022.  Non-insulin-dependent DM2, A1c 8.1 on 10/24/2022.  History of C3-4 ACDF.  Preop labs reviewed, unremarkable.  EKG 10/24/2022: Sinus bradycardia with premature atrial complexes.  Rate 58.  Minimal voltage criteria for LVH, may be normal variant.  TTE 10/27/2021:  1. Left ventricular ejection fraction, by estimation, is 55 to 60%. The  left ventricle has normal function. The left ventricle demonstrates  regional wall motion abnormalities (see scoring diagram/findings for  description). Left ventricular diastolic  parameters are consistent with Grade II diastolic dysfunction  (pseudonormalization).   2. Right ventricular systolic function is normal. The right ventricular  size is normal.   3. The mitral valve is normal in structure. Trivial mitral valve  regurgitation.   4. The aortic valve is normal in structure. Aortic valve regurgitation is  not visualized.    Katie Buckley [facility] Short Stay Center/Anesthesiology Phone [phone] 10/25/2022 3:30 PM

## 2022-10-25 NOTE — Telephone Encounter (Signed)
Thanks, yeah ill take care of it

## 2022-10-25 NOTE — Anesthesia Preprocedure Evaluation (Addendum)
Anesthesia Evaluation  Patient identified by MRN, date of birth, ID band Patient awake    Reviewed: Allergy & Precautions, NPO status , Patient's Chart, lab work & pertinent test results  History of Anesthesia Complications Negative for: history of anesthetic complications  Airway Mallampati: III  TM Distance: >3 FB Neck ROM: Full    Dental  (+) Dental Advisory Given, Teeth Intact   Pulmonary asthma    breath sounds clear to auscultation       Cardiovascular hypertension, Pt. on medications and Pt. on home beta blockers + CAD, + CABG, + Peripheral Vascular Disease and +CHF  + Valvular Problems/Murmurs  Rhythm:Regular + Systolic murmurs    Neuro/Psych  Headaches PSYCHIATRIC DISORDERS Anxiety Depression     Neuromuscular disease    GI/Hepatic ,GERD  ,,  Endo/Other  diabetes  Lab Results      Component                Value               Date                      HGBA1C                   8.1 (H)             10/24/2022             Renal/GU Lab Results      Component                Value               Date                      CREATININE               0.78                10/24/2022                Musculoskeletal  (+) Arthritis ,    Abdominal   Peds  Hematology negative hematology ROS (+) Lab Results      Component                Value               Date                      WBC                      8.7                 10/24/2022                HGB                      12.7                10/24/2022                HCT                      39.0                10/24/2022                MCV  86.9                10/24/2022                PLT                      170                 10/24/2022              Anesthesia Other Findings S/p AVR single vessel cabg 2023  Reproductive/Obstetrics                              Anesthesia Physical Anesthesia Plan  ASA: 3  Anesthesia  Plan: General   Post-op Pain Management: Ofirmev IV (intra-op)*   Induction: Intravenous  PONV Risk Score and Plan: 4 or greater and Dexamethasone and Ondansetron  Airway Management Planned: Oral ETT and Double Lumen EBT  Additional Equipment: None  Intra-op Plan:   Post-operative Plan: Extubation in OR  Informed Consent: I have reviewed the patients History and Physical, chart, labs and discussed the procedure including the risks, benefits and alternatives for the proposed anesthesia with the patient or authorized representative who has indicated his/her understanding and acceptance.     Dental advisory given  Plan Discussed with: CRNA  Anesthesia Plan Comments: (PAT note by Antionette Poles, PA-C: Follows with cardiologist Dr. 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  Patient had paroxysmal A-fib s/p CABG and was started on Eliquis.  Last seen 10/10/2022.  Antihypertensives were titrated at that time due to elevated blood pressure, amlodipine increased to 5 mg twice daily and losartan increased to 50 mg twice daily.  Otherwise stable, no changes to management.  Dr. Juliann Pares cleared the patient for surgery, advised she can hold Eliquis for 5 days prior to procedure.  Copy of clearance scanned under media tab 10/20/2022.  Patient reported last dose Eliquis 10/24/2022.  Non-insulin-dependent DM2, A1c 8.1 on 10/24/2022.  History of C3-4 ACDF.  Preop labs reviewed, unremarkable.  EKG 10/24/2022: Sinus bradycardia with premature atrial complexes.  Rate 58.  Minimal voltage criteria for LVH, may be normal variant.  TTE 10/27/2021:  1. Left ventricular ejection fraction, by estimation, is 55 to 60%. The  left ventricle has normal function. The left ventricle demonstrates  regional wall motion abnormalities (see scoring diagram/findings for  description). Left ventricular diastolic  parameters are consistent  with Grade II diastolic dysfunction  (pseudonormalization).   2. Right ventricular systolic function is normal. The right ventricular  size is normal.   3. The mitral valve is normal in structure. Trivial mitral valve  regurgitation.   4. The aortic valve is normal in structure. Aortic valve regurgitation is  not visualized.     )         Anesthesia Quick Evaluation

## 2022-10-25 NOTE — Addendum Note (Signed)
Addended byCurlene Dolphin, Oona Trammel on: 10/25/2022 10:24 AM   Modules accepted: Orders

## 2022-10-27 DIAGNOSIS — E113291 Type 2 diabetes mellitus with mild nonproliferative diabetic retinopathy without macular edema, right eye: Secondary | ICD-10-CM | POA: Diagnosis not present

## 2022-10-27 DIAGNOSIS — H169 Unspecified keratitis: Secondary | ICD-10-CM | POA: Diagnosis not present

## 2022-10-27 DIAGNOSIS — E113292 Type 2 diabetes mellitus with mild nonproliferative diabetic retinopathy without macular edema, left eye: Secondary | ICD-10-CM | POA: Diagnosis not present

## 2022-10-27 LAB — HM DIABETES EYE EXAM

## 2022-10-30 ENCOUNTER — Encounter (HOSPITAL_COMMUNITY)
Admission: RE | Disposition: A | Discharge status: Home / Self Care | Payer: Self-pay | Source: Home / Self Care | Attending: Plastic Surgery

## 2022-10-30 ENCOUNTER — Ambulatory Visit (HOSPITAL_COMMUNITY)
Admission: RE | Admit: 2022-10-30 | Discharge: 2022-10-30 | Disposition: A | Discharge status: Home / Self Care | Payer: PPO | Attending: Plastic Surgery | Admitting: Plastic Surgery

## 2022-10-30 ENCOUNTER — Other Ambulatory Visit: Payer: Self-pay

## 2022-10-30 ENCOUNTER — Ambulatory Visit (HOSPITAL_COMMUNITY): Payer: PPO | Admitting: Physician Assistant

## 2022-10-30 ENCOUNTER — Encounter (HOSPITAL_COMMUNITY): Payer: Self-pay | Admitting: Plastic Surgery

## 2022-10-30 ENCOUNTER — Ambulatory Visit (HOSPITAL_BASED_OUTPATIENT_CLINIC_OR_DEPARTMENT_OTHER): Payer: PPO

## 2022-10-30 DIAGNOSIS — I2510 Atherosclerotic heart disease of native coronary artery without angina pectoris: Secondary | ICD-10-CM | POA: Diagnosis not present

## 2022-10-30 DIAGNOSIS — Z7901 Long term (current) use of anticoagulants: Secondary | ICD-10-CM | POA: Diagnosis not present

## 2022-10-30 DIAGNOSIS — X58XXXA Exposure to other specified factors, initial encounter: Secondary | ICD-10-CM | POA: Insufficient documentation

## 2022-10-30 DIAGNOSIS — J45909 Unspecified asthma, uncomplicated: Secondary | ICD-10-CM | POA: Diagnosis not present

## 2022-10-30 DIAGNOSIS — I110 Hypertensive heart disease with heart failure: Secondary | ICD-10-CM

## 2022-10-30 DIAGNOSIS — F418 Other specified anxiety disorders: Secondary | ICD-10-CM | POA: Diagnosis not present

## 2022-10-30 DIAGNOSIS — N6481 Ptosis of breast: Secondary | ICD-10-CM

## 2022-10-30 DIAGNOSIS — Z951 Presence of aortocoronary bypass graft: Secondary | ICD-10-CM | POA: Diagnosis not present

## 2022-10-30 DIAGNOSIS — I509 Heart failure, unspecified: Secondary | ICD-10-CM | POA: Diagnosis not present

## 2022-10-30 DIAGNOSIS — E1151 Type 2 diabetes mellitus with diabetic peripheral angiopathy without gangrene: Secondary | ICD-10-CM | POA: Diagnosis not present

## 2022-10-30 DIAGNOSIS — T8543XA Leakage of breast prosthesis and implant, initial encounter: Secondary | ICD-10-CM

## 2022-10-30 DIAGNOSIS — E119 Type 2 diabetes mellitus without complications: Secondary | ICD-10-CM

## 2022-10-30 DIAGNOSIS — T8549XA Other mechanical complication of breast prosthesis and implant, initial encounter: Secondary | ICD-10-CM | POA: Diagnosis not present

## 2022-10-30 HISTORY — PX: MASTOPEXY: SHX5358

## 2022-10-30 HISTORY — PX: BREAST IMPLANT REMOVAL: SHX5361

## 2022-10-30 LAB — GLUCOSE, CAPILLARY
Glucose-Capillary: 167 mg/dL — ABNORMAL HIGH (ref 70–99)
Glucose-Capillary: 183 mg/dL — ABNORMAL HIGH (ref 70–99)

## 2022-10-30 SURGERY — REMOVAL, IMPLANT, BREAST
Anesthesia: General | Site: Breast | Laterality: Bilateral

## 2022-10-30 MED ORDER — FENTANYL CITRATE (PF) 250 MCG/5ML IJ SOLN
INTRAMUSCULAR | Status: DC | PRN
  Administered 2022-10-30: 25 ug via INTRAVENOUS
  Administered 2022-10-30: 50 ug via INTRAVENOUS
  Administered 2022-10-30: 100 ug via INTRAVENOUS

## 2022-10-30 MED ORDER — HEMOSTATIC AGENTS (NO CHARGE) OPTIME
TOPICAL | Status: DC | PRN
  Administered 2022-10-30: 2
  Administered 2022-10-30: 1

## 2022-10-30 MED ORDER — BUPIVACAINE-EPINEPHRINE (PF) 0.5% -1:200000 IJ SOLN
INTRAMUSCULAR | Status: AC
  Filled 2022-10-30: qty 30

## 2022-10-30 MED ORDER — ROCURONIUM BROMIDE 10 MG/ML (PF) SYRINGE
PREFILLED_SYRINGE | INTRAVENOUS | Status: AC
  Filled 2022-10-30: qty 10

## 2022-10-30 MED ORDER — FENTANYL CITRATE (PF) 100 MCG/2ML IJ SOLN
25.0000 ug | INTRAMUSCULAR | Status: DC | PRN
  Administered 2022-10-30 (×2): 50 ug via INTRAVENOUS

## 2022-10-30 MED ORDER — FENTANYL CITRATE (PF) 100 MCG/2ML IJ SOLN
INTRAMUSCULAR | Status: AC
  Filled 2022-10-30: qty 2

## 2022-10-30 MED ORDER — DEXAMETHASONE SODIUM PHOSPHATE 10 MG/ML IJ SOLN
INTRAMUSCULAR | Status: DC | PRN
  Administered 2022-10-30: 10 mg via INTRAVENOUS

## 2022-10-30 MED ORDER — INSULIN ASPART 100 UNIT/ML IJ SOLN: 0.0000 [IU] | INTRAMUSCULAR | Status: DC | PRN

## 2022-10-30 MED ORDER — DEXAMETHASONE SODIUM PHOSPHATE 10 MG/ML IJ SOLN
INTRAMUSCULAR | Status: AC
  Filled 2022-10-30: qty 1

## 2022-10-30 MED ORDER — CEFAZOLIN SODIUM-DEXTROSE 2-4 GM/100ML-% IV SOLN
2.0000 g | INTRAVENOUS | Status: AC
  Administered 2022-10-30: 2 g via INTRAVENOUS

## 2022-10-30 MED ORDER — BUPIVACAINE LIPOSOME 1.3 % IJ SUSP
INTRAMUSCULAR | Status: AC
  Filled 2022-10-30: qty 20

## 2022-10-30 MED ORDER — PROPOFOL 10 MG/ML IV BOLUS
INTRAVENOUS | Status: AC
  Filled 2022-10-30: qty 20

## 2022-10-30 MED ORDER — ROCURONIUM BROMIDE 10 MG/ML (PF) SYRINGE
PREFILLED_SYRINGE | INTRAVENOUS | Status: DC | PRN
  Administered 2022-10-30: 40 mg via INTRAVENOUS
  Administered 2022-10-30: 20 mg via INTRAVENOUS

## 2022-10-30 MED ORDER — BACITRACIN ZINC 500 UNIT/GM EX OINT
TOPICAL_OINTMENT | CUTANEOUS | Status: AC
  Filled 2022-10-30: qty 28.35

## 2022-10-30 MED ORDER — CHLORHEXIDINE GLUCONATE 0.12 % MT SOLN: 15.0000 mL | Freq: Once | OROMUCOSAL | Status: AC

## 2022-10-30 MED ORDER — ORAL CARE MOUTH RINSE: 15.0000 mL | Freq: Once | OROMUCOSAL | Status: AC

## 2022-10-30 MED ORDER — OXYCODONE HCL 5 MG PO TABS: 5.0000 mg | ORAL_TABLET | Freq: Once | ORAL | Status: DC | PRN

## 2022-10-30 MED ORDER — 0.9 % SODIUM CHLORIDE (POUR BTL) OPTIME
TOPICAL | Status: DC | PRN
  Administered 2022-10-30: 2000 mL

## 2022-10-30 MED ORDER — ONDANSETRON HCL 4 MG/2ML IJ SOLN
INTRAMUSCULAR | Status: AC
  Filled 2022-10-30: qty 2

## 2022-10-30 MED ORDER — LACTATED RINGERS IV SOLN: INTRAVENOUS | Status: DC

## 2022-10-30 MED ORDER — CHLORHEXIDINE GLUCONATE 0.12 % MT SOLN
OROMUCOSAL | Status: AC
  Administered 2022-10-30: 15 mL via OROMUCOSAL
  Filled 2022-10-30: qty 15

## 2022-10-30 MED ORDER — OXYCODONE HCL 5 MG/5ML PO SOLN: 5.0000 mg | Freq: Once | ORAL | Status: DC | PRN

## 2022-10-30 MED ORDER — PROPOFOL 10 MG/ML IV BOLUS
INTRAVENOUS | Status: DC | PRN
  Administered 2022-10-30: 120 mg via INTRAVENOUS

## 2022-10-30 MED ORDER — BUPIVACAINE LIPOSOME 1.3 % IJ SUSP
INTRAMUSCULAR | Status: DC | PRN
  Administered 2022-10-30: 20 mL

## 2022-10-30 MED ORDER — ACETAMINOPHEN 10 MG/ML IV SOLN: 1000.0000 mg | Freq: Once | INTRAVENOUS | Status: DC | PRN

## 2022-10-30 MED ORDER — ONDANSETRON HCL 4 MG/2ML IJ SOLN
INTRAMUSCULAR | Status: DC | PRN
  Administered 2022-10-30: 4 mg via INTRAVENOUS

## 2022-10-30 MED ORDER — ACETAMINOPHEN 500 MG PO TABS: 1000.0000 mg | ORAL_TABLET | Freq: Once | ORAL | Status: DC | PRN

## 2022-10-30 MED ORDER — SODIUM CHLORIDE 0.9 % IV SOLN
INTRAVENOUS | Status: DC | PRN
  Administered 2022-10-30: 512 mL

## 2022-10-30 MED ORDER — FENTANYL CITRATE (PF) 250 MCG/5ML IJ SOLN
INTRAMUSCULAR | Status: AC
  Filled 2022-10-30: qty 5

## 2022-10-30 MED ORDER — LIDOCAINE-EPINEPHRINE 1 %-1:100000 IJ SOLN
INTRAMUSCULAR | Status: AC
  Filled 2022-10-30: qty 1

## 2022-10-30 MED ORDER — LIDOCAINE 2% (20 MG/ML) 5 ML SYRINGE
INTRAMUSCULAR | Status: AC
  Filled 2022-10-30: qty 5

## 2022-10-30 MED ORDER — SUGAMMADEX SODIUM 200 MG/2ML IV SOLN
INTRAVENOUS | Status: DC | PRN
  Administered 2022-10-30: 100 mg via INTRAVENOUS
  Administered 2022-10-30: 200 mg via INTRAVENOUS

## 2022-10-30 MED ORDER — LIDOCAINE 2% (20 MG/ML) 5 ML SYRINGE
INTRAMUSCULAR | Status: DC | PRN
  Administered 2022-10-30: 60 mg via INTRAVENOUS

## 2022-10-30 MED ORDER — ACETAMINOPHEN 160 MG/5ML PO SOLN: 1000.0000 mg | Freq: Once | ORAL | Status: DC | PRN

## 2022-10-30 MED ORDER — LIDOCAINE-EPINEPHRINE 1 %-1:100000 IJ SOLN
INTRAMUSCULAR | Status: DC | PRN
  Administered 2022-10-30: 36 mL

## 2022-10-30 MED ORDER — CEFAZOLIN SODIUM-DEXTROSE 2-4 GM/100ML-% IV SOLN
INTRAVENOUS | Status: AC
  Filled 2022-10-30: qty 100

## 2022-10-30 MED ORDER — CHLORHEXIDINE GLUCONATE CLOTH 2 % EX PADS: 6.0000 | MEDICATED_PAD | Freq: Once | CUTANEOUS | Status: DC

## 2022-10-30 SURGICAL SUPPLY — 63 items
ADH SKN CLS APL DERMABOND .7 (GAUZE/BANDAGES/DRESSINGS) ×2
APL PRP STRL LF DISP 70% ISPRP (MISCELLANEOUS) ×1
BAG COUNTER SPONGE SURGICOUNT (BAG) ×1 IMPLANT
BAG DECANTER FOR FLEXI CONT (MISCELLANEOUS) ×1 IMPLANT
BAG SPNG CNTER NS LX DISP (BAG) ×1
BINDER BREAST LRG (GAUZE/BANDAGES/DRESSINGS) ×1 IMPLANT
BINDER BREAST XLRG (GAUZE/BANDAGES/DRESSINGS) IMPLANT
BIOPATCH RED 1 DISK 7.0 (GAUZE/BANDAGES/DRESSINGS) IMPLANT
BNDG ELASTIC 6X5.8 VLCR STR LF (GAUZE/BANDAGES/DRESSINGS) IMPLANT
CANISTER SUCT 3000ML PPV (MISCELLANEOUS) ×1 IMPLANT
CHLORAPREP W/TINT 26 (MISCELLANEOUS) ×1 IMPLANT
COVER SURGICAL LIGHT HANDLE (MISCELLANEOUS) ×1 IMPLANT
DERMABOND ADVANCED .7 DNX12 (GAUZE/BANDAGES/DRESSINGS) IMPLANT
DRAIN CHANNEL 19F RND (DRAIN) ×1 IMPLANT
DRAPE HALF SHEET 40X57 (DRAPES) IMPLANT
DRAPE ORTHO SPLIT 77X108 STRL (DRAPES) ×2
DRAPE SURG ORHT 6 SPLT 77X108 (DRAPES) ×2 IMPLANT
DRAPE WARM FLUID 44X44 (DRAPES) ×1 IMPLANT
DRSG MEPILEX POST OP 4X8 (GAUZE/BANDAGES/DRESSINGS) IMPLANT
ELECT BLADE 6.5 EXT (BLADE) IMPLANT
ELECT REM PT RETURN 9FT ADLT (ELECTROSURGICAL) ×1
ELECTRODE REM PT RTRN 9FT ADLT (ELECTROSURGICAL) ×1 IMPLANT
EVACUATOR SILICONE 100CC (DRAIN) ×1 IMPLANT
GAUZE PAD ABD 8X10 STRL (GAUZE/BANDAGES/DRESSINGS) ×2 IMPLANT
GAUZE SPONGE 4X4 12PLY STRL (GAUZE/BANDAGES/DRESSINGS) ×1 IMPLANT
GLOVE BIO SURGEON STRL SZ 6.5 (GLOVE) ×3 IMPLANT
GLOVE BIOGEL M STRL SZ7.5 (GLOVE) IMPLANT
GLOVE BIOGEL PI IND STRL 8 (GLOVE) IMPLANT
GLOVE INDICATOR 8.0 STRL GRN (GLOVE) IMPLANT
GOWN STRL REUS W/ TWL LRG LVL3 (GOWN DISPOSABLE) ×3 IMPLANT
GOWN STRL REUS W/TWL LRG LVL3 (GOWN DISPOSABLE) ×3
HEMOSTAT ARISTA ABSORB 3G PWDR (HEMOSTASIS) IMPLANT
KIT BASIN OR (CUSTOM PROCEDURE TRAY) ×1 IMPLANT
KIT TURNOVER KIT B (KITS) ×1 IMPLANT
MARKER SKIN DUAL TIP RULER LAB (MISCELLANEOUS) ×1 IMPLANT
NDL HYPO 22X1.5 SAFETY MO (MISCELLANEOUS) IMPLANT
NDL HYPO 25GX1X1/2 BEV (NEEDLE) ×1 IMPLANT
NEEDLE HYPO 22X1.5 SAFETY MO (MISCELLANEOUS) ×1 IMPLANT
NEEDLE HYPO 25GX1X1/2 BEV (NEEDLE) ×1 IMPLANT
NS IRRIG 1000ML POUR BTL (IV SOLUTION) ×1 IMPLANT
PACK GENERAL/GYN (CUSTOM PROCEDURE TRAY) ×1 IMPLANT
PAD ARMBOARD 7.5X6 YLW CONV (MISCELLANEOUS) ×2 IMPLANT
PENCIL SMOKE EVACUATOR (MISCELLANEOUS) ×1 IMPLANT
SOL PREP POV-IOD 4OZ 10% (MISCELLANEOUS) ×1 IMPLANT
STAPLER VISISTAT 35W (STAPLE) ×1 IMPLANT
STRIP CLOSURE SKIN 1/2X4 (GAUZE/BANDAGES/DRESSINGS) ×2 IMPLANT
SUT MNCRL AB 3-0 PS2 18 (SUTURE) IMPLANT
SUT MNCRL AB 4-0 PS2 18 (SUTURE) ×4 IMPLANT
SUT MON AB 3-0 SH 27 (SUTURE) ×1
SUT MON AB 3-0 SH27 (SUTURE) ×1 IMPLANT
SUT MON AB 5-0 PS2 18 (SUTURE) ×1 IMPLANT
SUT PDS AB 2-0 CT1 27 (SUTURE) IMPLANT
SUT PDS AB 3-0 SH 27 (SUTURE) ×1 IMPLANT
SUT PROLENE 3 0 PS 2 (SUTURE) ×2 IMPLANT
SUT SILK 3 0 SH 30 (SUTURE) IMPLANT
SUT VIC AB 2-0 SH 18 (SUTURE) IMPLANT
SUT VIC AB 3-0 SH 18 (SUTURE) ×2 IMPLANT
SYR CONTROL 10ML LL (SYRINGE) ×1 IMPLANT
TOWEL GREEN STERILE (TOWEL DISPOSABLE) ×1 IMPLANT
TOWEL GREEN STERILE FF (TOWEL DISPOSABLE) ×1 IMPLANT
TRAY FOLEY MTR SLVR 16FR STAT (SET/KITS/TRAYS/PACK) IMPLANT
WATER STERILE IRR 1000ML POUR (IV SOLUTION) IMPLANT
YANKAUER SUCT BULB TIP NO VENT (SUCTIONS) IMPLANT

## 2022-10-30 NOTE — Discharge Instructions (Signed)
INSTRUCTIONS FOR AFTER BREAST SURGERY   You will likely have some questions about what to expect following your operation.  The following information will help you and your family understand what to expect when you are discharged from the hospital.  It is important to follow these guidelines to help ensure a smooth recovery and reduce complication.  Postoperative instructions include information on: diet, wound care, medications and physical activity.  AFTER SURGERY Expect to go home after the procedure.  In some cases, you may need to spend one night in the hospital for observation.  DIET Breast surgery does not require a specific diet.  However, the healthier you eat the better your body will heal. It is important to increasing your protein intake.  This means limiting the foods with sugar and carbohydrates.  Focus on vegetables and some meat.  If you have liposuction during your procedure be sure to drink water.  If your urine is bright yellow, then it is concentrated, and you need to drink more water.  As a general rule after surgery, you should have 8 ounces of water every hour while awake.  If you find you are persistently nauseated or unable to take in liquids let us know.  NO TOBACCO USE or EXPOSURE.  This will slow your healing process and lead to a wound.  WOUND CARE Leave the binder on for 3 days . Use fragrance free soap like Dial, Dove or Ivory.   After 3 days you can remove the binder to shower. Once dry apply binder or sports bra. If you have liposuction you will have a soft and spongy dressing (Lipofoam) that helps prevent creases in your skin.  Remove before you shower and then replace it.  It is also available on [employer]. If you have steri-strips / tape directly attached to your skin leave them in place. It is OK to get these wet.   No baths, pools or hot tubs for four weeks. We close your incision to leave the smallest and best-looking scar. No ointment or creams on your incisions  for four weeks.  No Neosporin (Too many skin reactions).  A few weeks after surgery you can use Mederma and start massaging the scar. We ask you to wear your binder or sports bra for the first 6 weeks around the clock, including while sleeping. This provides added comfort and helps reduce the fluid accumulation at the surgery site. NO Ice or heating pads to the operative site.  You have a very high risk of a BURN before you feel the temperature change.  ACTIVITY No heavy lifting until cleared by the doctor.  This usually means no more than a half-gallon of milk.  It is OK to walk and climb stairs. Moving your legs is very important to decrease your risk of a blood clot.  It will also help keep you from getting deconditioned.  Every 1 to 2 hours get up and walk for 5 minutes. This will help with a quicker recovery back to normal.  Let pain be your guide so you don't do too much.  This time is for you to recover.  You will be more comfortable if you sleep and rest with your head elevated either with a few pillows under you or in a recliner.  No stomach sleeping for a three months.  WORK Everyone returns to work at different times. As a rough guide, most people take at least 1 - 2 weeks off prior to returning to work. If   you need documentation for your job, give the forms to the front staff at the clinic.  DRIVING Arrange for someone to bring you home from the hospital after your surgery.  You may be able to drive a few days after surgery but not while taking any narcotics or valium.  BOWEL MOVEMENTS Constipation can occur after anesthesia and while taking pain medication.  It is important to stay ahead for your comfort.  We recommend taking Milk of Magnesia (2 tablespoons; twice a day) while taking the pain pills.  MEDICATIONS You may be prescribed should start after surgery At your preoperative visit for you history and physical you may have been given the following medications: An antibiotic: Start  this medication when you get home and take according to the instructions on the bottle. Zofran 4 mg:  This is to treat nausea and vomiting.  You can take this every 6 hours as needed and only if needed. Valium 2 mg for breast cancer patients: This is for muscle tightness if you have an implant or expander. This will help relax your muscle which also helps with pain control.  This can be taken every 12 hours as needed. Don't drive after taking this medication. Norco (hydrocodone/acetaminophen) 5/325 mg:  This is only to be used after you have taken the Motrin or the Tylenol. Every 8 hours as needed.   Over the counter Medication to take: Ibuprofen (Motrin) 600 mg:  Take this every 6 hours.  If you have additional pain then take 500 mg of the Tylenol every 8 hours.  Only take the Norco after you have tried these two. MiraLAX or Milk of Magnesia: Take this according to the bottle if you take the Norco.  WHEN TO CALL Call your surgeon's office if any of the following occur: Fever 101 degrees F or greater Excessive bleeding or fluid from the incision site. Pain that increases over time without aid from the medications Redness, warmth, or pus draining from incision sites Persistent nausea or inability to take in liquids Severe misshapen area that underwent the operation. About my Jackson-Pratt Bulb Drain  What is a Jackson-Pratt bulb? A Jackson-Pratt is a soft, round device used to collect drainage. It is connected to a long, thin drainage catheter, which is held in place by one or two small stiches near your surgical incision site. When the bulb is squeezed, it forms a vacuum, forcing the drainage to empty into the bulb.  Emptying the Jackson-Pratt bulb- To empty the bulb: 1. Release the plug on the top of the bulb. 2. Pour the bulb's contents into a measuring container which your nurse will provide. 3. Record the time emptied and amount of drainage. Empty the drain(s) as often as your      doctor or nurse recommends.  Date                  Time                    Amount (Drain 1)                 Amount (Drain 2)  _____________________________________________________________________  _____________________________________________________________________  _____________________________________________________________________  _____________________________________________________________________  _____________________________________________________________________  _____________________________________________________________________  _____________________________________________________________________  _____________________________________________________________________  Squeezing the Jackson-Pratt Bulb- To squeeze the bulb: 1. Make sure the plug at the top of the bulb is open. 2. Squeeze the bulb tightly in your fist. You will hear air squeezing from the bulb. 3. Replace the plug while the bulb   is squeezed. 4. Use a safety pin to attach the bulb to your clothing. This will keep the catheter from     pulling at the bulb insertion site.  When to call your doctor- Call your doctor if: Drain site becomes red, swollen or hot. You have a fever greater than 101 degrees F. There is oozing at the drain site. Drain falls out (apply a guaze bandage over the drain hole and secure it with tape). Drainage increases daily not related to activity patterns. (You will usually have more drainage when you are active than when you are resting.) Drainage has a bad odor.  

## 2022-10-30 NOTE — Op Note (Signed)
Op report   DATE OF OPERATION: 10/30/2022  LOCATION: [facility]  SURGICAL DIVISION: Plastic Surgery  PREOPERATIVE DIAGNOSIS:  Ruptured right breast implant. Breast / arm pain. Bilateral breast ptosis   POSTOPERATIVE DIAGNOSIS:  Ruptured right breast implant. Breast / arm pain. Bilateral breast ptosis  Ruptured left breast implant  PROCEDURE:  1. Bilateral removal of ruptured breast implants.  2. Bilateral complete capsulectomies.  SURGEON: Foster Simpson, DO  ASSISTANT: Evelena Leyden, PA  ANESTHESIA:  General.   COMPLICATIONS: None.   DRAINS: Bilateral  INDICATIONS FOR PROCEDURE:  The patient, Katie Buckley, is a 72 y.o. female born on 05/02/1951, is here for treatment of a right ruptured breast implant.  She had implants first placed many years ago and then exchanged.  She complained of pain in her chest, breast and arm.  The MM showed a ruptured right breast implant.  I suspect a left ruptured implant.  She will require capsulectomies. MRN: [id number]  CONSENT:  Informed consent was obtained directly from the patient. Risks, benefits and alternatives were fully discussed. Specific risks including but not limited to bleeding, infection, hematoma, seroma, scarring, pain, infection, asymmetry, wound healing problems, and need for further surgery were all discussed. The patient did have an ample opportunity to have her questions answered to her satisfaction.   DESCRIPTION OF PROCEDURE:  The patient was taken to the operating room. SCDs were placed and IV antibiotics were given. The patient's chest was prepped and draped in a sterile fashion. A time out was performed and the implants to be used were identified.    On the right breast: Local with epinephrine was used to infiltrate at the incision site and skin.  The patient was marked in holding and these markings were confirmed.  The #10 blade was used to incise the skin at the marked sites for  a lollipop type incision.  The skin around the areola was de-epithelialized.  The bovie was used to dissect to the capsule.  There was silicone in the breast tissue.  I was able to excise nearly all of the silicone at least what I could feel and see.  The capsule broke so the pieces of the implant were removed and the silicone.  The implant was completely ruptured.  The capsule was freed from the surrounding breast tissue and removed.  The pocket was irrigated with antibiotic solution. Hemostasis was ensured with electrocautery. Arista was placed and a drain.  The drain was secured with the 3-0 Silk to the skin. New gloves were placed. The vertical area was de-epithelialized.  The pocket was then closed with the 3-0 PDS.  The 3-0 Monocryl was then used to close the next layer.  The skin was closed with the 4-0 Monocryl.  The areola was positioned into place with the 3-0 Monocryl. The skin was closed with the 4-0 Monocryl.     On the left breast:  Local with epinephrine was used to infiltrate at the incision site and skin.  The patient was marked in holding and these markings were confirmed.  The #10 blade was used to incise the skin at the marked sites for a lollipop type incision.  The skin around the areola was de-epithelialized.  The bovie was used to dissect to the capsule.  There was silicone in the breast tissue.  The implant was ruptured but it seemed that the rupture was contained within the capsule. The capsule was freed from the surrounding breast tissue and removed with the  implant in place.  The pocket was irrigated with antibiotic solution. Hemostasis was ensured with electrocautery. Arista was placed and a drain.  The drain was secured with the 3-0 Silk to the skin. New gloves were placed. The vertical area was de-epithelialized.  The pocket was then closed with the 3-0 PDS.  The 3-0 Monocryl was then used to close the next layer.  The skin was closed with the 4-0 Monocryl.  The areola was positioned  into place with the 3-0 Monocryl. The skin was closed with the 4-0 Monocryl.  Dermabond was applied to the incision site. A breast binder and ABDs were placed.  The patient was allowed to wake from anesthesia and taken to the recovery room in satisfactory condition.   The advanced practice practitioner (APP) assisted throughout the case.  The APP was essential in retraction and counter traction when needed to make the case progress smoothly.  This retraction and assistance made it possible to see the tissue plans for the procedure.  The assistance was needed for blood control, tissue re-approximation and assisted with closure of the incision site.

## 2022-10-30 NOTE — Transfer of Care (Signed)
Immediate Anesthesia Transfer of Care Note  Patient: Katie Buckley  Procedure(s) Performed: Bilateral removal of breast implants with mastopexy (Bilateral: Breast) MASTOPEXY (Bilateral: Breast)  Patient Location: PACU  Anesthesia Type:General  Level of Consciousness: awake and alert   Airway & Oxygen Therapy: Patient Spontanous Breathing and Patient connected to face mask oxygen  Post-op Assessment: Report given to RN and Post -op Vital signs reviewed and stable  Post vital signs: Reviewed and stable  Last Vitals:  Vitals Value Taken Time  BP 171/65 10/30/22 1515  Temp    Pulse 61 10/30/22 1516  Resp 17 10/30/22 1516  SpO2 99 % 10/30/22 1516  Vitals shown include unvalidated device data.  Last Pain:  Vitals:   10/30/22 1056  PainSc: 0-No pain         Complications: No notable events documented.

## 2022-10-30 NOTE — Interval H&P Note (Signed)
History and Physical Interval Note:  10/30/2022 12:07 PM  [name]  has presented today for surgery, with the diagnosis of Rupture of implant of right breast.  The various methods of treatment have been discussed with the patient and family. After consideration of risks, benefits and other options for treatment, the patient has consented to  Procedure(s): Bilateral removal of breast implants with mastopexy (Bilateral) MASTOPEXY (Bilateral) as a surgical intervention.  The patient's history has been reviewed, patient examined, no change in status, stable for surgery.  I have reviewed the patient's chart and labs.  Questions were answered to the patient's satisfaction.     Katie Buckley

## 2022-10-30 NOTE — Anesthesia Procedure Notes (Signed)
Procedure Name: Intubation Date/Time: 10/30/2022 1:01 PM  Performed by: Nils Pyle, CRNAPre-anesthesia Checklist: Patient identified, Emergency Drugs available, Suction available and Patient being monitored Patient Re-evaluated:Patient Re-evaluated prior to induction Oxygen Delivery Method: Circle System Utilized Preoxygenation: Pre-oxygenation with 100% oxygen Induction Type: IV induction Ventilation: Mask ventilation without difficulty and Oral airway inserted - appropriate to patient size Laryngoscope Size: Glidescope and 3 Grade View: Grade I Tube type: Oral Tube size: 7.0 mm Number of attempts: 1 Airway Equipment and Method: Stylet and Oral airway Placement Confirmation: ETT inserted through vocal cords under direct vision, positive ETCO2 and breath sounds checked- equal and bilateral Secured at: 22 cm Tube secured with: Tape Dental Injury: Teeth and Oropharynx as per pre-operative assessment

## 2022-10-31 ENCOUNTER — Encounter (HOSPITAL_COMMUNITY): Payer: Self-pay | Admitting: Plastic Surgery

## 2022-10-31 NOTE — Anesthesia Postprocedure Evaluation (Signed)
Anesthesia Post Note  Patient: Katie Buckley  Procedure(s) Performed: Bilateral removal of breast implants with mastopexy (Bilateral: Breast) MASTOPEXY (Bilateral: Breast)     Patient location during evaluation: PACU Anesthesia Type: General Level of consciousness: awake and alert Pain management: pain level controlled Vital Signs Assessment: post-procedure vital signs reviewed and stable Respiratory status: spontaneous breathing, nonlabored ventilation, respiratory function stable and patient connected to nasal cannula oxygen Cardiovascular status: blood pressure returned to baseline and stable Postop Assessment: no apparent nausea or vomiting Anesthetic complications: no   No notable events documented.  Last Vitals:  Vitals:   10/30/22 1600 10/30/22 1610  BP: (!) 146/58   Pulse: 61 (!) 59  Resp: 10 18  Temp: (!) 36.4 C   SpO2: 93% 95%    Last Pain:  Vitals:   10/30/22 1600  PainSc: 0-No pain                 Shery [place]un[place]a S

## 2022-11-01 LAB — SURGICAL PATHOLOGY

## 2022-11-01 NOTE — Progress Notes (Signed)
noted 

## 2022-11-03 DIAGNOSIS — J324 Chronic pansinusitis: Secondary | ICD-10-CM | POA: Diagnosis not present

## 2022-11-03 DIAGNOSIS — H04123 Dry eye syndrome of bilateral lacrimal glands: Secondary | ICD-10-CM | POA: Diagnosis not present

## 2022-11-03 DIAGNOSIS — J310 Chronic rhinitis: Secondary | ICD-10-CM | POA: Diagnosis not present

## 2022-11-07 ENCOUNTER — Encounter: Payer: Self-pay | Admitting: Family

## 2022-11-07 DIAGNOSIS — E13319 Other specified diabetes mellitus with unspecified diabetic retinopathy without macular edema: Secondary | ICD-10-CM | POA: Insufficient documentation

## 2022-11-08 ENCOUNTER — Encounter: Payer: Self-pay | Admitting: Plastic Surgery

## 2022-11-08 ENCOUNTER — Ambulatory Visit (INDEPENDENT_AMBULATORY_CARE_PROVIDER_SITE_OTHER): Payer: PPO | Admitting: Plastic Surgery

## 2022-11-08 VITALS — BP 177/72 | HR 62 | Ht 60.0 in | Wt 141.0 lb

## 2022-11-08 DIAGNOSIS — T8543XA Leakage of breast prosthesis and implant, initial encounter: Secondary | ICD-10-CM

## 2022-11-08 DIAGNOSIS — Z9889 Other specified postprocedural states: Secondary | ICD-10-CM

## 2022-11-08 NOTE — Progress Notes (Signed)
The patient is a 72 year old female here for follow-up after undergoing removal of bilateral breast implants.  She is quite pleased with the result.  She has some soreness on the left side but none on the right.  The drain output is minimal.  I went ahead and removed the drains.  She can shower as needed.  Will plan to see her back in 1 to 2 weeks for follow-up.  We will get a picture at that time.

## 2022-11-16 DIAGNOSIS — Z952 Presence of prosthetic heart valve: Secondary | ICD-10-CM | POA: Diagnosis not present

## 2022-11-16 DIAGNOSIS — I10 Essential (primary) hypertension: Secondary | ICD-10-CM | POA: Diagnosis not present

## 2022-11-16 DIAGNOSIS — K219 Gastro-esophageal reflux disease without esophagitis: Secondary | ICD-10-CM | POA: Diagnosis not present

## 2022-11-16 DIAGNOSIS — E782 Mixed hyperlipidemia: Secondary | ICD-10-CM | POA: Diagnosis not present

## 2022-11-16 DIAGNOSIS — E1165 Type 2 diabetes mellitus with hyperglycemia: Secondary | ICD-10-CM | POA: Diagnosis not present

## 2022-11-16 DIAGNOSIS — R270 Ataxia, unspecified: Secondary | ICD-10-CM | POA: Diagnosis not present

## 2022-11-16 DIAGNOSIS — I350 Nonrheumatic aortic (valve) stenosis: Secondary | ICD-10-CM | POA: Diagnosis not present

## 2022-11-16 DIAGNOSIS — I480 Paroxysmal atrial fibrillation: Secondary | ICD-10-CM | POA: Diagnosis not present

## 2022-11-21 ENCOUNTER — Ambulatory Visit (INDEPENDENT_AMBULATORY_CARE_PROVIDER_SITE_OTHER): Payer: PPO | Admitting: Physician Assistant

## 2022-11-21 VITALS — BP 148/75 | HR 57

## 2022-11-21 DIAGNOSIS — T8543XA Leakage of breast prosthesis and implant, initial encounter: Secondary | ICD-10-CM

## 2022-11-21 DIAGNOSIS — Z9889 Other specified postprocedural states: Secondary | ICD-10-CM

## 2022-11-21 NOTE — Progress Notes (Signed)
This is a 72 year old female status post removal of bilateral breast implants by Dr. Ulice Bold on 10/30/2022 seen in the office today for postop follow-up.  The patient was last seen in the office on 11/08/2022 by Dr. Ulice Bold.  At that time she had been doing well.  Her drain output was low and the drains were removed.  Since her last office visit she denies any significant surgical related concerns.  She notes that her left breast appears slightly larger when compared to the right but is overall happy with her cosmetic outcome.  She denies any infectious signs or symptoms.  On exam dressings are in place, once removed the incisions appear clean dry and intact with Dermabond over the top.  Breasts are symmetric with minimal fullness of the left when compared to the right.,  No palpable fluid collections or area of firmness, bilateral NAC's are viable.  Photos taken today.  Overall the patient is doing very well postoperatively.  She has a good cosmetic outcome.  There are no issues or concerns at today's visit.  She will follow-up at her regular scheduled appointment.  She may apply Vaseline to the incision sites.  She will follow-up sooner if she develops any new or worsening signs or symptoms.

## 2022-11-22 ENCOUNTER — Encounter: Payer: Self-pay | Admitting: Internal Medicine

## 2022-11-22 DIAGNOSIS — I10 Essential (primary) hypertension: Secondary | ICD-10-CM

## 2022-11-23 DIAGNOSIS — F331 Major depressive disorder, recurrent, moderate: Secondary | ICD-10-CM | POA: Diagnosis not present

## 2022-11-23 DIAGNOSIS — F411 Generalized anxiety disorder: Secondary | ICD-10-CM | POA: Diagnosis not present

## 2022-11-24 DIAGNOSIS — F331 Major depressive disorder, recurrent, moderate: Secondary | ICD-10-CM | POA: Diagnosis not present

## 2022-11-24 DIAGNOSIS — G4700 Insomnia, unspecified: Secondary | ICD-10-CM | POA: Diagnosis not present

## 2022-11-24 DIAGNOSIS — R41840 Attention and concentration deficit: Secondary | ICD-10-CM | POA: Diagnosis not present

## 2022-11-24 DIAGNOSIS — F411 Generalized anxiety disorder: Secondary | ICD-10-CM | POA: Diagnosis not present

## 2022-11-28 ENCOUNTER — Other Ambulatory Visit: Payer: Self-pay | Admitting: Internal Medicine

## 2022-11-28 DIAGNOSIS — I10 Essential (primary) hypertension: Secondary | ICD-10-CM

## 2022-11-30 ENCOUNTER — Ambulatory Visit
Admission: RE | Admit: 2022-11-30 | Discharge: 2022-11-30 | Disposition: A | Discharge status: Home / Self Care | Payer: PPO | Source: Ambulatory Visit | Attending: Internal Medicine | Admitting: Internal Medicine

## 2022-11-30 DIAGNOSIS — I701 Atherosclerosis of renal artery: Secondary | ICD-10-CM | POA: Diagnosis not present

## 2022-11-30 DIAGNOSIS — I10 Essential (primary) hypertension: Secondary | ICD-10-CM | POA: Diagnosis not present

## 2022-12-01 DIAGNOSIS — J324 Chronic pansinusitis: Secondary | ICD-10-CM | POA: Diagnosis not present

## 2022-12-01 DIAGNOSIS — J301 Allergic rhinitis due to pollen: Secondary | ICD-10-CM | POA: Diagnosis not present

## 2022-12-01 DIAGNOSIS — H903 Sensorineural hearing loss, bilateral: Secondary | ICD-10-CM | POA: Diagnosis not present

## 2022-12-05 ENCOUNTER — Ambulatory Visit (INDEPENDENT_AMBULATORY_CARE_PROVIDER_SITE_OTHER): Payer: PPO | Admitting: Physician Assistant

## 2022-12-05 VITALS — BP 118/65 | HR 56

## 2022-12-05 DIAGNOSIS — T8543XA Leakage of breast prosthesis and implant, initial encounter: Secondary | ICD-10-CM

## 2022-12-05 DIAGNOSIS — Z9889 Other specified postprocedural states: Secondary | ICD-10-CM

## 2022-12-05 NOTE — Progress Notes (Signed)
This is a 72 year old female who is status post removal of bilateral breast implants by Dr. Ulice Bold on 10/30/2022.  She was last seen in the office on 11/21/2022.  At that time she had no significant issues or complaints.  Since her last office visit she notes she has been doing well, she denies any issues with her visions.  She does note that her right nipple is inverted.  Otherwise she is happy with her cosmetic outcome.  She denies any infectious signs or symptoms.  Chaperone present on exam incisions are clean dry and intact with no wounds.  She does have inversion of the right nipple.  Breasts are soft with no palpable fluid collections or area of firmness.  Photos taken at last visit.  Overall the patient is doing very well from postoperative standpoint.  She we will start using scar creams.  She will follow-up in our office in 2 to 3 months for repeat evaluation or sooner as needed.  She verbalized understanding and agreement to today's plan had no further questions or concerns.

## 2022-12-11 ENCOUNTER — Other Ambulatory Visit: Payer: Self-pay | Admitting: Family Medicine

## 2022-12-11 DIAGNOSIS — E1140 Type 2 diabetes mellitus with diabetic neuropathy, unspecified: Secondary | ICD-10-CM

## 2022-12-14 ENCOUNTER — Other Ambulatory Visit: Payer: Self-pay | Admitting: Family Medicine

## 2022-12-15 ENCOUNTER — Encounter: Payer: Self-pay | Admitting: Plastic Surgery

## 2022-12-15 NOTE — Telephone Encounter (Signed)
Pt no longer under prescribers care  Requested Prescriptions  Refused Prescriptions Disp Refills   glipiZIDE (GLUCOTROL) 10 MG tablet [Pharmacy Med Name: GLIPIZIDE 10 MG TABLET] 90 tablet 4    Sig: TAKE 1 TABLET BY MOUTH EVERY DAY BEFORE BREAKFAST     Endocrinology:  Diabetes - Sulfonylureas Failed - 12/14/2022  7:10 PM      Failed - HBA1C is between 0 and 7.9 and within 180 days    Hemoglobin A1C  Date Value Ref Range Status  07/13/2016 8.5  Final   Hgb A1c MFr Bld  Date Value Ref Range Status  10/24/2022 8.1 (H) 4.8 - 5.6 % Final    Comment:    (NOTE) Pre diabetes:          5.7%-6.4%  Diabetes:              >6.4%  Glycemic control for   <7.0% adults with diabetes          Failed - Valid encounter within last 6 months    Recent Outpatient Visits           1 year ago Pneumonia due to infectious organism, unspecified laterality, unspecified part of lung   [facility] Malva Limes, MD   1 year ago Nausea   [facility] Malva Limes, MD   1 year ago Status post aortic valve replacement with tissue valve   [facility] Malva Limes, MD   1 year ago Type 2 diabetes mellitus with diabetic neuropathy, without long-term current use of insulin (HCC)   [place] [facility] Malva Limes, MD   1 year ago Uncontrolled type 2 diabetes mellitus with diabetic neuropathy, without long-term current use of insulin (HCC)   [facility] Malva Limes, MD       Future Appointments             In 1 month MacDiarmid, Scott, MD [facility] [place]            Passed - Cr in normal range and within 360 days    Creat  Date Value Ref Range Status  05/01/2017 0.61 0.50 - 0.99 mg/dL Final    Comment:    For patients >69 years of age, the reference limit for Creatinine is approximately 13% higher for people identified as  African-American. .    Creatinine, Ser  Date Value Ref Range Status  10/24/2022 0.78 0.44 - 1.00 mg/dL Final   Creatinine,U  Date Value Ref Range Status  03/08/2022 68.7 mg/dL Final

## 2022-12-18 ENCOUNTER — Ambulatory Visit: Payer: PPO | Admitting: Anesthesiology

## 2022-12-18 ENCOUNTER — Encounter
Admission: RE | Disposition: A | Discharge status: Home / Self Care | Payer: Self-pay | Source: Home / Self Care | Attending: Gastroenterology

## 2022-12-18 ENCOUNTER — Ambulatory Visit: Payer: PPO | Admitting: Urology

## 2022-12-18 ENCOUNTER — Encounter: Payer: Self-pay | Admitting: Gastroenterology

## 2022-12-18 ENCOUNTER — Other Ambulatory Visit: Payer: Self-pay

## 2022-12-18 ENCOUNTER — Ambulatory Visit
Admission: RE | Admit: 2022-12-18 | Discharge: 2022-12-18 | Disposition: A | Discharge status: Home / Self Care | Payer: PPO | Attending: Gastroenterology | Admitting: Gastroenterology

## 2022-12-18 DIAGNOSIS — I2510 Atherosclerotic heart disease of native coronary artery without angina pectoris: Secondary | ICD-10-CM | POA: Insufficient documentation

## 2022-12-18 DIAGNOSIS — Z953 Presence of xenogenic heart valve: Secondary | ICD-10-CM | POA: Insufficient documentation

## 2022-12-18 DIAGNOSIS — I110 Hypertensive heart disease with heart failure: Secondary | ICD-10-CM | POA: Diagnosis not present

## 2022-12-18 DIAGNOSIS — E1140 Type 2 diabetes mellitus with diabetic neuropathy, unspecified: Secondary | ICD-10-CM | POA: Diagnosis not present

## 2022-12-18 DIAGNOSIS — E1151 Type 2 diabetes mellitus with diabetic peripheral angiopathy without gangrene: Secondary | ICD-10-CM | POA: Diagnosis not present

## 2022-12-18 DIAGNOSIS — F418 Other specified anxiety disorders: Secondary | ICD-10-CM | POA: Diagnosis not present

## 2022-12-18 DIAGNOSIS — T182XXA Foreign body in stomach, initial encounter: Secondary | ICD-10-CM | POA: Diagnosis not present

## 2022-12-18 DIAGNOSIS — Z951 Presence of aortocoronary bypass graft: Secondary | ICD-10-CM | POA: Diagnosis not present

## 2022-12-18 DIAGNOSIS — K3184 Gastroparesis: Secondary | ICD-10-CM | POA: Diagnosis not present

## 2022-12-18 DIAGNOSIS — I509 Heart failure, unspecified: Secondary | ICD-10-CM | POA: Diagnosis not present

## 2022-12-18 DIAGNOSIS — F419 Anxiety disorder, unspecified: Secondary | ICD-10-CM | POA: Diagnosis not present

## 2022-12-18 DIAGNOSIS — Z79899 Other long term (current) drug therapy: Secondary | ICD-10-CM | POA: Insufficient documentation

## 2022-12-18 DIAGNOSIS — F32A Depression, unspecified: Secondary | ICD-10-CM | POA: Insufficient documentation

## 2022-12-18 DIAGNOSIS — K2100 Gastro-esophageal reflux disease with esophagitis, without bleeding: Secondary | ICD-10-CM

## 2022-12-18 DIAGNOSIS — I739 Peripheral vascular disease, unspecified: Secondary | ICD-10-CM | POA: Diagnosis not present

## 2022-12-18 DIAGNOSIS — Z862 Personal history of diseases of the blood and blood-forming organs and certain disorders involving the immune mechanism: Secondary | ICD-10-CM

## 2022-12-18 DIAGNOSIS — Z85828 Personal history of other malignant neoplasm of skin: Secondary | ICD-10-CM | POA: Insufficient documentation

## 2022-12-18 DIAGNOSIS — E11319 Type 2 diabetes mellitus with unspecified diabetic retinopathy without macular edema: Secondary | ICD-10-CM | POA: Insufficient documentation

## 2022-12-18 DIAGNOSIS — Z7984 Long term (current) use of oral hypoglycemic drugs: Secondary | ICD-10-CM | POA: Diagnosis not present

## 2022-12-18 DIAGNOSIS — K2950 Unspecified chronic gastritis without bleeding: Secondary | ICD-10-CM | POA: Insufficient documentation

## 2022-12-18 DIAGNOSIS — K219 Gastro-esophageal reflux disease without esophagitis: Secondary | ICD-10-CM | POA: Insufficient documentation

## 2022-12-18 DIAGNOSIS — K2270 Barrett's esophagus without dysplasia: Secondary | ICD-10-CM | POA: Diagnosis present

## 2022-12-18 HISTORY — PX: ESOPHAGOGASTRODUODENOSCOPY (EGD) WITH PROPOFOL: SHX5813

## 2022-12-18 HISTORY — PX: BIOPSY: SHX5522

## 2022-12-18 LAB — GLUCOSE, CAPILLARY: Glucose-Capillary: 264 mg/dL — ABNORMAL HIGH (ref 70–99)

## 2022-12-18 SURGERY — ESOPHAGOGASTRODUODENOSCOPY (EGD) WITH PROPOFOL
Anesthesia: General

## 2022-12-18 MED ORDER — LIDOCAINE HCL (CARDIAC) PF 100 MG/5ML IV SOSY
PREFILLED_SYRINGE | INTRAVENOUS | Status: DC | PRN
  Administered 2022-12-18: 50 mg via INTRAVENOUS

## 2022-12-18 MED ORDER — PROPOFOL 500 MG/50ML IV EMUL
INTRAVENOUS | Status: DC | PRN
  Administered 2022-12-18: 125 ug/kg/min via INTRAVENOUS

## 2022-12-18 MED ORDER — SODIUM CHLORIDE 0.9 % IV SOLN: INTRAVENOUS | Status: DC

## 2022-12-18 MED ORDER — GLYCOPYRROLATE 0.2 MG/ML IJ SOLN
INTRAMUSCULAR | Status: AC
  Filled 2022-12-18: qty 1

## 2022-12-18 MED ORDER — DEXMEDETOMIDINE HCL IN NACL 80 MCG/20ML IV SOLN
INTRAVENOUS | Status: DC | PRN
  Administered 2022-12-18: 20 ug via INTRAVENOUS

## 2022-12-18 MED ORDER — PROPOFOL 1000 MG/100ML IV EMUL
INTRAVENOUS | Status: AC
  Filled 2022-12-18: qty 100

## 2022-12-18 MED ORDER — PROPOFOL 10 MG/ML IV BOLUS
INTRAVENOUS | Status: DC | PRN
  Administered 2022-12-18: 70 mg via INTRAVENOUS

## 2022-12-18 MED ORDER — GLYCOPYRROLATE 0.2 MG/ML IJ SOLN
INTRAMUSCULAR | Status: DC | PRN
  Administered 2022-12-18: .1 mg via INTRAVENOUS

## 2022-12-18 NOTE — Transfer of Care (Signed)
Immediate Anesthesia Transfer of Care Note  Patient: Katie Buckley  Procedure(s) Performed: ESOPHAGOGASTRODUODENOSCOPY (EGD) WITH PROPOFOL BIOPSY  Patient Location: PACU  Anesthesia Type:General  Level of Consciousness: oriented, drowsy, and patient cooperative  Airway & Oxygen Therapy: Patient Spontanous Breathing  Post-op Assessment: Report given to RN and Post -op Vital signs reviewed and stable  Post vital signs: Reviewed and stable  Last Vitals:  Vitals Value Taken Time  BP 156/64 12/18/22 1054  Temp    Pulse 66 12/18/22 1054  Resp 21 12/18/22 1054  SpO2 94 % 12/18/22 1054  Vitals shown include unvalidated device data.  Last Pain:  Vitals:   12/18/22 0937  TempSrc: Temporal         Complications: No notable events documented.

## 2022-12-18 NOTE — Anesthesia Preprocedure Evaluation (Signed)
Anesthesia Evaluation  Patient identified by MRN, date of birth, ID band Patient awake    Reviewed: Allergy & Precautions, H&P , NPO status , Patient's Chart, lab work & pertinent test results, reviewed documented beta blocker date and time   Airway Mallampati: II   Neck ROM: full    Dental  (+) Poor Dentition   Pulmonary asthma , pneumonia, resolved   Pulmonary exam normal        Cardiovascular Exercise Tolerance: Good hypertension, On Medications + CAD, + CABG, + Peripheral Vascular Disease, +CHF and + DOE  + Valvular Problems/Murmurs AS  Rhythm:regular Rate:Normal     Neuro/Psych  Headaches PSYCHIATRIC DISORDERS Anxiety Depression     Neuromuscular disease    GI/Hepatic Neg liver ROS,GERD  Medicated,,  Endo/Other  negative endocrine ROSdiabetes, Well Controlled    Renal/GU negative Renal ROS  negative genitourinary   Musculoskeletal   Abdominal   Peds  Hematology  (+) Blood dyscrasia, anemia   Anesthesia Other Findings Past Medical History: No date: Anemia No date: Anxiety 01/21/2013: Aortic stenosis     Comment:  s/p bioprosthetic TAVR 10/2021 No date: Arthritis     Comment:  Left Hip No date: Coronary artery disease     Comment:  CABG 2023 x1 No date: Diabetes mellitus without complication (HCC) No date: Diabetic neuropathy (HCC) No date: Diabetic retinopathy (HCC) No date: Frequent sinus infections No date: GERD (gastroesophageal reflux disease) No date: Headache     Comment:  Tension headaches No date: Heart murmur     Comment:  AVR 2023 No date: History of Barrett's esophagus No date: History of colon polyps No date: Hyperchloremia No date: Hypertension 2018: MRSA (methicillin resistant staph aureus) culture positive     Comment:  history of, in hand No date: Palpitations No date: Pneumonia     Comment:  Hx No date: Seasonal allergies No date: Skin cancer     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 Past Surgical History: 11/15/2020: ANTERIOR CERVICAL DECOMP/DISCECTOMY FUSION; N/A     Comment:  Procedure: ANTERIOR CERVICAL DECOMPRESSION/DISCECTOMY               FUSION 1 LEVEL C3/4;  Surgeon: Lucy Chris, MD;                Location: [facility] ORS;  Service: Neurosurgery;  Laterality:               N/A; 09/19/2021: AORTIC VALVE REPLACEMENT; N/A     Comment:  Procedure: AORTIC VALVE REPLACEMENT Using 19mm Edwards               Resilia Inspiris Valve;  Surgeon: Alleen Borne, MD;                Location: [facility] OR;  Service: Open Heart Surgery;                Laterality: N/A; No date: AUGMENTATION MAMMAPLASTY; Bilateral     Comment:  breast implants 1980s 2007: Bone Spur     Comment:  foot 10/30/2022: BREAST IMPLANT REMOVAL; Bilateral     Comment:  Procedure: Bilateral removal of breast implants with               mastopexy;  Surgeon: Peggye Form, DO;  Location:              [facility] OR;  Service: Plastics;  Laterality: Bilateral; No date: CARDIAC CATHETERIZATION 2008: Carpal Tunnel Symdrome     Comment:  as repeated in 2009 01/16/2019: CATARACT EXTRACTION W/PHACO; Left     Comment:  Procedure: CATARACT EXTRACTION PHACO AND INTRAOCULAR               LENS PLACEMENT (IOC) LEFT DIABETES VISION BLUE;  Surgeon:              Elliot Cousin, MD;  Location: [facility] ORS;  Service:               Ophthalmology;  Laterality: Left;  [place]  00:47 CDE               6.62 Fluid pack lot # [id number] 02/06/2019: CATARACT EXTRACTION W/PHACO; Right     Comment:  Procedure: CATARACT EXTRACTION PHACO AND INTRAOCULAR               LENS PLACEMENT (IOC);  Surgeon: Elliot Cousin, MD;                Location: [facility] ORS;  Service: Ophthalmology;  Laterality:              Right;  [place] 00:54.6 CDE 6.52 FLUID PACK LOT # [id number] 2007: CHOLECYSTECTOMY 11/17/2015: COLONOSCOPY WITH PROPOFOL; N/A     Comment:  Procedure: COLONOSCOPY WITH PROPOFOL;  Surgeon: Scot Jun, MD;  Location:  [facility] ENDOSCOPY;  Service:               Endoscopy;  Laterality: N/A; 06/21/2022: COLONOSCOPY WITH PROPOFOL; N/A     Comment:  Procedure: COLONOSCOPY WITH PROPOFOL;  Surgeon: Wyline Mood, MD;  Location: [facility] ENDOSCOPY;  Service:               Gastroenterology;  Laterality: N/A; 09/19/2021: CORONARY ARTERY BYPASS GRAFT; N/A     Comment:  Procedure: CORONARY ARTERY BYPASS GRAFTING X1 Using Left              Internal Mammory artery;  Surgeon: Alleen Borne, MD;                Location: [facility] OR;  Service: Open Heart Surgery;                Laterality: N/A; 05/24/2021: CORONARY PRESSURE/FFR STUDY; N/A     Comment:  Procedure: INTRAVASCULAR PRESSURE WIRE/FFR STUDY;                Surgeon: Alwyn Pea, MD;  Location: [facility] INVASIVE              CV LAB;  Service: Cardiovascular;  Laterality: N/A; 11/17/2015: ESOPHAGOGASTRODUODENOSCOPY (EGD) WITH PROPOFOL; N/A     Comment:  Procedure: ESOPHAGOGASTRODUODENOSCOPY (EGD) WITH               PROPOFOL;  Surgeon: Scot Jun, MD;  Location: [facility]              ENDOSCOPY;  Service: Endoscopy;  Laterality: N/A; No date: MANDIBLE SURGERY 10/30/2022: MASTOPEXY; Bilateral     Comment:  Procedure: MASTOPEXY;  Surgeon: Peggye Form,               DO;  Location: [facility] OR;  Service: Plastics;  Laterality:               Bilateral; 03/2013: NASAL SINUS SURGERY     Comment:  Multile surgery Dr. Chestine Spore. DX eosinophilic sinusitis  [id number] 10/2010: Release of Trigger Finger; Right     Comment:  Dr. Katrinka Blazing 05/24/2021: RIGHT/LEFT HEART CATH AND CORONARY ANGIOGRAPHY; N/A     Comment:  Procedure: RIGHT/LEFT HEART CATH AND CORONARY               ANGIOGRAPHY;  Surgeon: Alwyn Pea, MD;  Location:              [facility] INVASIVE CV LAB;  Service: Cardiovascular;                Laterality: N/A; 09/19/2021: TEE WITHOUT CARDIOVERSION; N/A     Comment:  Procedure: TRANSESOPHAGEAL ECHOCARDIOGRAM (TEE);                Surgeon: Alleen Borne, MD;  Location: [facility] OR;  Service:              Open Heart Surgery;  Laterality: N/A; 1960: TONSILLECTOMY BMI    Body Mass Index: 26.95 kg/m     Reproductive/Obstetrics negative OB ROS                             Anesthesia Physical Anesthesia Plan  ASA: 4  Anesthesia Plan: General   Post-op Pain Management:    Induction:   PONV Risk Score and Plan:   Airway Management Planned:   Additional Equipment:   Intra-op Plan:   Post-operative Plan:   Informed Consent: I have reviewed the patients History and Physical, chart, labs and discussed the procedure including the risks, benefits and alternatives for the proposed anesthesia with the patient or authorized representative who has indicated his/her understanding and acceptance.     Dental Advisory Given  Plan Discussed with: CRNA  Anesthesia Plan Comments:        Anesthesia Quick Evaluation

## 2022-12-18 NOTE — Op Note (Signed)
[facility] Patient Name: [name] Procedure Date: 12/18/2022 10:37 AM MRN: [id number] Account #: [id number] Date of Birth: 01-Jul-1950 Admit Type: Outpatient Age: 72 Room: [facility] ENDO ROOM 1 Gender: Female Note Status: Finalized Instrument Name: Upper Endoscope [id number] Procedure:             Upper GI endoscopy Indications:           Exclusion of Barrett's esophagus Providers:             Wyline Mood MD, MD Referring MD:          Wyline Mood MD, MD (Referring MD), No Local Md, MD                         (Referring MD) Medicines:             Monitored Anesthesia Care Complications:         No immediate complications. Procedure:             Pre-Anesthesia Assessment:                        - Prior to the procedure, a History and Physical was                         performed, and patient medications, allergies and                         sensitivities were reviewed. The patient's tolerance                         of previous anesthesia was reviewed.                        - The risks and benefits of the procedure and the                         sedation options and risks were discussed with the                         patient. All questions were answered and informed                         consent was obtained.                        - After reviewing the risks and benefits, the patient                         was deemed in satisfactory condition to undergo the                         procedure.                        - ASA Grade Assessment: II - A patient with mild                         systemic disease.                        After obtaining informed  consent, the endoscope was                         passed under direct vision. Throughout the procedure,                         the patient's blood pressure, pulse, and oxygen                         saturations were monitored continuously. The Endoscope                         was introduced  through the mouth, and advanced to the                         third part of duodenum. The upper GI endoscopy was                         accomplished with ease. The patient tolerated the                         procedure well. Findings:      There were esophageal mucosal changes suspicious for short-segment       Barrett's esophagus present at the gastroesophageal junction. The       maximum longitudinal extent of these mucosal changes was 1 cm in length.       This was biopsied with a cold forceps for histology.      The examined duodenum was normal.      A large amount of food (residue) was found in the entire examined       stomach.      The cardia and gastric fundus were normal on retroflexion. Impression:            - Esophageal mucosal changes suspicious for                         short-segment Barrett's esophagus. Biopsied.                        - Normal examined duodenum.                        - A large amount of food (residue) in the stomach. Recommendation:        - Discharge patient to home (with escort).                        - Gastroparesis diet.                        - Continue present medications.                        - Await pathology results.                        - Return to my office in 8 weeks. Procedure Code(s):     --- Professional ---                        [phone], Esophagogastroduodenoscopy, flexible,  transoral; with biopsy, single or multiple Diagnosis Code(s):     --- Professional ---                        K22.89, Other specified disease of esophagus CPT copyright 2022 American Medical Association. All rights reserved. The codes documented in this report are preliminary and upon coder review may  be revised to meet current compliance requirements. Wyline Mood, MD Wyline Mood MD, MD 12/18/2022 10:51:31 AM This report has been signed electronically. Number of Addenda: 0 Note Initiated On: 12/18/2022 10:37 AM Estimated Blood Loss:   Estimated blood loss: none.      [facility]

## 2022-12-18 NOTE — H&P (Signed)
Wyline Mood, MD [address], [address], [place], [place], [address] [address], [place], [place], [address] Phone: [phone]  Fax: [phone]  Primary Care Physician:  Mort Sawyers, FNP   Pre-Procedure History & Physical: HPI:  Katie Buckley is a 72 y.o. female is here for an endoscopy    Past Medical History:  Diagnosis Date   Anemia    Anxiety    Aortic stenosis 01/21/2013   s/p bioprosthetic TAVR 10/2021   Arthritis    Left Hip   Coronary artery disease    CABG 2023 x1   Diabetes mellitus without complication (HCC)    Diabetic neuropathy (HCC)    Diabetic retinopathy (HCC)    Frequent sinus infections    GERD (gastroesophageal reflux disease)    Headache    Tension headaches   Heart murmur    AVR 2023   History of Barrett's esophagus    History of colon polyps    Hyperchloremia    Hypertension    MRSA (methicillin resistant staph aureus) culture positive 2018   history of, in hand   Palpitations    Pneumonia    Hx   Seasonal allergies    Skin cancer    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    Past Surgical History:  Procedure Laterality Date   ANTERIOR CERVICAL DECOMP/DISCECTOMY FUSION N/A 11/15/2020   Procedure: ANTERIOR CERVICAL DECOMPRESSION/DISCECTOMY FUSION 1 LEVEL C3/4;  Surgeon: Lucy Chris, MD;  Location: [facility] ORS;  Service: Neurosurgery;  Laterality: N/A;   AORTIC VALVE REPLACEMENT N/A 09/19/2021   Procedure: AORTIC VALVE REPLACEMENT Using 19mm Edwards Resilia Inspiris Valve;  Surgeon: Alleen Borne, MD;  Location: [facility] OR;  Service: Open Heart Surgery;  Laterality: N/A;   AUGMENTATION MAMMAPLASTY Bilateral    breast implants 1980s   Bone Spur  2007   foot   BREAST IMPLANT REMOVAL Bilateral 10/30/2022   Procedure: Bilateral removal of breast implants with mastopexy;  Surgeon: Peggye Form, DO;  Location: [facility] OR;  Service: Plastics;  Laterality: Bilateral;   CARDIAC CATHETERIZATION     Carpal Tunnel Symdrome   2008   as repeated in 2009   CATARACT EXTRACTION W/PHACO Left 01/16/2019   Procedure: CATARACT EXTRACTION PHACO AND INTRAOCULAR LENS PLACEMENT (IOC) LEFT DIABETES VISION BLUE;  Surgeon: Elliot Cousin, MD;  Location: [facility] ORS;  Service: Ophthalmology;  Laterality: Left;  [place]  00:47 CDE 6.62 Fluid pack lot # [id number]   CATARACT EXTRACTION W/PHACO Right 02/06/2019   Procedure: CATARACT EXTRACTION PHACO AND INTRAOCULAR LENS PLACEMENT (IOC);  Surgeon: Elliot Cousin, MD;  Location: [facility] ORS;  Service: Ophthalmology;  Laterality: Right;  [place] 00:54.6 CDE 6.52 FLUID PACK LOT # [id number]   CHOLECYSTECTOMY  2007   COLONOSCOPY WITH PROPOFOL N/A 11/17/2015   Procedure: COLONOSCOPY WITH PROPOFOL;  Surgeon: Scot Jun, MD;  Location: [facility] ENDOSCOPY;  Service: Endoscopy;  Laterality: N/A;   COLONOSCOPY WITH PROPOFOL N/A 06/21/2022   Procedure: COLONOSCOPY WITH PROPOFOL;  Surgeon: Wyline Mood, MD;  Location: [facility] ENDOSCOPY;  Service: Gastroenterology;  Laterality: N/A;   CORONARY ARTERY BYPASS GRAFT N/A 09/19/2021   Procedure: CORONARY ARTERY BYPASS GRAFTING X1 Using Left Internal Mammory artery;  Surgeon: Alleen Borne, MD;  Location: [facility] OR;  Service: Open Heart Surgery;  Laterality: N/A;   CORONARY PRESSURE/FFR STUDY N/A 05/24/2021   Procedure: INTRAVASCULAR PRESSURE WIRE/FFR STUDY;  Surgeon: Alwyn Pea, MD;  Location: [facility] INVASIVE CV LAB;  Service: Cardiovascular;  Laterality: N/A;  ESOPHAGOGASTRODUODENOSCOPY (EGD) WITH PROPOFOL N/A 11/17/2015   Procedure: ESOPHAGOGASTRODUODENOSCOPY (EGD) WITH PROPOFOL;  Surgeon: Scot Jun, MD;  Location: [facility] ENDOSCOPY;  Service: Endoscopy;  Laterality: N/A;   MANDIBLE SURGERY     MASTOPEXY Bilateral 10/30/2022   Procedure: MASTOPEXY;  Surgeon: Peggye Form, DO;  Location: [facility] OR;  Service: Plastics;  Laterality: Bilateral;   NASAL SINUS SURGERY  03/2013   Multile surgery Dr. Chestine Spore. 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 Katrinka Blazing   RIGHT/LEFT HEART CATH AND CORONARY ANGIOGRAPHY N/A 05/24/2021   Procedure: RIGHT/LEFT HEART CATH AND CORONARY ANGIOGRAPHY;  Surgeon: Alwyn Pea, MD;  Location: [facility] INVASIVE CV LAB;  Service: Cardiovascular;  Laterality: N/A;   TEE WITHOUT CARDIOVERSION N/A 09/19/2021   Procedure: TRANSESOPHAGEAL ECHOCARDIOGRAM (TEE);  Surgeon: Alleen Borne, MD;  Location: [facility] OR;  Service: Open Heart Surgery;  Laterality: N/A;   TONSILLECTOMY  1960    Prior to Admission medications   Medication Sig Start Date End Date Taking? Authorizing Provider  amLODipine (NORVASC) 5 MG tablet Take 1 tablet (5 mg total) by mouth daily. Patient taking differently: Take 5 mg by mouth 2 (two) times daily. 09/10/22 12/18/22 Yes Pia Mau M, PA-C  metoprolol tartrate (LOPRESSOR) 50 MG tablet Take 1 tablet (50 mg total) by mouth 2 (two) times daily. 09/24/21  Yes Joycelyn Man, Donielle M, PA-C  rosuvastatin (CRESTOR) 20 MG tablet TAKE 1 TABLET BY MOUTH EVERY DAY IN THE EVENING 08/25/22  Yes Dugal, [name], FNP  acetaminophen (TYLENOL) 650 MG CR tablet Take 1,300 mg by mouth every 8 (eight) hours as needed for pain (pain).    [provider]  apixaban (ELIQUIS) 5 MG TABS tablet Take 1 tablet (5 mg total) by mouth 2 (two) times daily. 11/02/21 10/23/22  Darlin Priestly, MD  BIOTIN PO Take 1 tablet by mouth once a week.    [provider]  buPROPion (WELLBUTRIN SR) 150 MG 12 hr tablet Take 150 mg by mouth 2 (two) times daily. 05/25/20   [provider]  butalbital-acetaminophen-caffeine (FIORICET) 50-325-40 MG tablet Take 1 tablet by mouth every 6 (six) hours as needed for headache. 07/31/22   Mort Sawyers, FNP  ezetimibe (ZETIA) 10 MG tablet Take 1 tablet (10 mg total) by mouth at bedtime. 07/31/22 07/26/23  Mort Sawyers, FNP  fexofenadine (ALLEGRA) 180 MG tablet Take 180 mg by mouth daily as needed for allergies or rhinitis.    [provider]  FLUoxetine  (PROZAC) 40 MG capsule Take 40 mg by mouth daily.    [provider]  fluticasone (FLONASE) 50 MCG/ACT nasal spray Place 1 spray into both nostrils daily as needed for allergies or rhinitis.    [provider]  glipiZIDE (GLUCOTROL) 5 MG tablet Take 1 tablet (5 mg total) by mouth daily. 09/11/22   Mort Sawyers, FNP  Lancet Devices MISC     [provider]  metFORMIN (GLUCOPHAGE-XR) 500 MG 24 hr tablet TAKE 2 TABLETS BY MOUTH EVERY DAY 11/07/21   Malva Limes, MD  montelukast (SINGULAIR) 10 MG tablet Take 1 tablet (10 mg total) by mouth at bedtime as needed (allergies). Home med. 11/02/21   Darlin Priestly, MD  ondansetron (ZOFRAN ODT) 4 MG disintegrating tablet Take 1 tablet (4 mg total) by mouth every 8 (eight) hours as needed for nausea or vomiting. 03/29/22   Malva Limes, MD  pregabalin (LYRICA) 75 MG capsule Take 1 capsule (75 mg total) by mouth  3 (three) times daily. 04/19/22   Mort Sawyers, FNP  RABEprazole (ACIPHEX) 20 MG tablet Take 1 tablet (20 mg total) by mouth 2 times daily at 12 noon and 4 pm. Patient not taking: Reported on 11/21/2022 09/13/22   Wyline Mood, MD  traZODone (DESYREL) 50 MG tablet Take 50-100 mg by mouth at bedtime. 04/10/22   [provider]    Allergies as of 10/23/2022 - Review Complete 10/23/2022  Allergen Reaction Noted   Doxycycline Other (See Comments) 01/08/2018   Biaxin [clarithromycin]  12/16/2014    Family History  Problem Relation Age of Onset   Lung cancer Mother    Diabetes Father    Irritable bowel syndrome Sister    Throat cancer Brother    Breast cancer Daughter    Breast cancer Maternal Aunt     Social History   Socioeconomic History   Marital status: Married    Spouse name: Fayrene Fearing, husband   Number of children: 1   Years of education: Not on file   Highest education level: Not on file  Occupational History    Comment: retired    Associate Professor: RETIRED  Tobacco Use   Smoking status: Never   Smokeless  tobacco: Never  Vaping Use   Vaping Use: Never used  Substance and Sexual Activity   Alcohol use: No   Drug use: No   Sexual activity: Not Currently    Partners: Male    Birth control/protection: Post-menopausal  Other Topics Concern   Not on file  Social History Narrative   Not on file   Social Determinants of Health   Financial Resource Strain: Low Risk  (01/31/2022)   Overall Financial Resource Strain (CARDIA)    Difficulty of Paying Living Expenses: Not hard at all  Food Insecurity: No Food Insecurity (01/31/2022)   Hunger Vital Sign    Worried About Running Out of Food in the Last Year: Never true    Ran Out of Food in the Last Year: Never true  Transportation Needs: No Transportation Needs (01/31/2022)   PRAPARE - Transportation    Lack of Transportation (Medical): No    Lack of Transportation (Non-Medical): No  Physical Activity: Inactive (01/31/2022)   Exercise Vital Sign    Days of Exercise per Week: 0 days    Minutes of Exercise per Session: 0 min  Stress: Stress Concern Present (01/31/2022)   [employer] of Occupational Health - Occupational Stress Questionnaire    Feeling of Stress : To some extent  Social Connections: Moderately Isolated (01/31/2022)   Social Connection and Isolation Panel [NHANES]    Frequency of Communication with Friends and Family: Twice a week    Frequency of Social Gatherings with Friends and Family: Once a week    Attends Religious Services: Never    Database administrator or Organizations: No    Attends Banker Meetings: Never    Marital Status: Married  Catering manager Violence: Not At Risk (01/31/2022)   Humiliation, Afraid, Rape, and Kick questionnaire    Fear of Current or Ex-Partner: No    Emotionally Abused: No    Physically Abused: No    Sexually Abused: No    Review of Systems: See HPI, otherwise negative ROS  Physical Exam: BP (!) 162/52   Pulse (!) 55   Temp 97.6 F (36.4 C) (Temporal)   Resp 17    Ht 5' (1.524 m)   Wt 62.6 kg   SpO2 96%   BMI 26.95 kg/m  General:   Alert,  pleasant and cooperative in NAD Head:  Normocephalic and atraumatic. Neck:  Supple; no masses or thyromegaly. Lungs:  Clear throughout to auscultation, normal respiratory effort.    Heart:  +S1, +S2, Regular rate and rhythm, No edema. Abdomen:  Soft, nontender and nondistended. Normal bowel sounds, without guarding, and without rebound.   Neurologic:  Alert and  oriented x4;  grossly normal neurologically.  Impression/Plan: Katie Buckley is here for an endoscopy  to be performed for  evaluation of barrettes esophagus    Risks, benefits, limitations, and alternatives regarding endoscopy have been reviewed with the patient.  Questions have been answered.  All parties agreeable.   Wyline Mood, MD  12/18/2022, 10:02 AM

## 2022-12-19 ENCOUNTER — Encounter: Payer: Self-pay | Admitting: Gastroenterology

## 2022-12-19 DIAGNOSIS — I519 Heart disease, unspecified: Secondary | ICD-10-CM | POA: Diagnosis not present

## 2022-12-19 DIAGNOSIS — Z48812 Encounter for surgical aftercare following surgery on the circulatory system: Secondary | ICD-10-CM | POA: Diagnosis not present

## 2022-12-19 DIAGNOSIS — I6523 Occlusion and stenosis of bilateral carotid arteries: Secondary | ICD-10-CM | POA: Diagnosis not present

## 2022-12-19 DIAGNOSIS — I10 Essential (primary) hypertension: Secondary | ICD-10-CM | POA: Diagnosis not present

## 2022-12-19 DIAGNOSIS — I350 Nonrheumatic aortic (valve) stenosis: Secondary | ICD-10-CM | POA: Diagnosis not present

## 2022-12-19 NOTE — Anesthesia Postprocedure Evaluation (Signed)
Anesthesia Post Note  Patient: Katie Buckley  Procedure(s) Performed: ESOPHAGOGASTRODUODENOSCOPY (EGD) WITH PROPOFOL BIOPSY  Patient location during evaluation: PACU Anesthesia Type: General Level of consciousness: awake and alert Pain management: pain level controlled Vital Signs Assessment: post-procedure vital signs reviewed and stable Respiratory status: spontaneous breathing, nonlabored ventilation, respiratory function stable and patient connected to nasal cannula oxygen Cardiovascular status: blood pressure returned to baseline and stable Postop Assessment: no apparent nausea or vomiting Anesthetic complications: no   No notable events documented.   Last Vitals:  Vitals:   12/18/22 1104 12/18/22 1114  BP: (!) 148/68 (!) 160/68  Pulse: 63 (!) 59  Resp: 14 (!) 26  Temp:    SpO2: 94% 95%    Last Pain:  Vitals:   12/18/22 1114  TempSrc:   PainSc: 0-No pain                 Yevette Edwards

## 2022-12-21 ENCOUNTER — Encounter: Payer: Self-pay | Admitting: Gastroenterology

## 2022-12-21 NOTE — Progress Notes (Signed)
noted 

## 2022-12-22 ENCOUNTER — Other Ambulatory Visit: Payer: Self-pay

## 2022-12-22 MED ORDER — ONDANSETRON 4 MG PO TBDP: 4.0000 mg | ORAL_TABLET | Freq: Three times a day (TID) | ORAL | 3 refills | Status: DC | PRN

## 2022-12-27 ENCOUNTER — Telehealth: Payer: Self-pay | Admitting: *Deleted

## 2022-12-27 NOTE — Telephone Encounter (Signed)
Attempted to reach Ms. Loureiro by phone- NA/LVM advising Dr. Ulice Bold will be in surgery but she can call my extension directly tomorrow and I will relay her concerns to Dr. Ulice Bold.

## 2022-12-27 NOTE — Telephone Encounter (Signed)
I had the opportunity to talk to Katie Buckley. She voices concern that her right breast is smaller than the left and feels different. Also the right nipple is still inverted and it wasn't before surgery. She confirms the right breast was the one that had the silicone implant rupture. Advised her to keep her appointment in August so Dr. Ulice Bold can evaluate her in person and give recommendations for what revisions can be done. She voiced understanding and thanked me for the call.

## 2022-12-28 ENCOUNTER — Other Ambulatory Visit: Payer: Self-pay | Admitting: *Deleted

## 2022-12-28 DIAGNOSIS — E1140 Type 2 diabetes mellitus with diabetic neuropathy, unspecified: Secondary | ICD-10-CM

## 2022-12-28 MED ORDER — CONTOUR NEXT TEST VI STRP
ORAL_STRIP | 3 refills | Status: AC
Start: 2022-12-28 — End: ?

## 2022-12-28 MED ORDER — CONTOUR NEXT MONITOR W/DEVICE KIT
PACK | 0 refills | Status: AC
Start: 2022-12-28 — End: ?

## 2023-01-06 ENCOUNTER — Other Ambulatory Visit: Payer: Self-pay | Admitting: Family Medicine

## 2023-01-06 DIAGNOSIS — E1140 Type 2 diabetes mellitus with diabetic neuropathy, unspecified: Secondary | ICD-10-CM

## 2023-01-08 NOTE — Telephone Encounter (Signed)
Refused because she is now a pt with Adult nurse at [facility].

## 2023-01-09 DIAGNOSIS — I10 Essential (primary) hypertension: Secondary | ICD-10-CM | POA: Diagnosis not present

## 2023-01-09 DIAGNOSIS — I350 Nonrheumatic aortic (valve) stenosis: Secondary | ICD-10-CM | POA: Diagnosis not present

## 2023-01-09 DIAGNOSIS — I519 Heart disease, unspecified: Secondary | ICD-10-CM | POA: Diagnosis not present

## 2023-01-09 DIAGNOSIS — R270 Ataxia, unspecified: Secondary | ICD-10-CM | POA: Diagnosis not present

## 2023-01-09 DIAGNOSIS — E1165 Type 2 diabetes mellitus with hyperglycemia: Secondary | ICD-10-CM | POA: Diagnosis not present

## 2023-01-09 DIAGNOSIS — Z952 Presence of prosthetic heart valve: Secondary | ICD-10-CM | POA: Diagnosis not present

## 2023-01-09 DIAGNOSIS — R0602 Shortness of breath: Secondary | ICD-10-CM | POA: Diagnosis not present

## 2023-01-09 DIAGNOSIS — K219 Gastro-esophageal reflux disease without esophagitis: Secondary | ICD-10-CM | POA: Diagnosis not present

## 2023-01-09 DIAGNOSIS — I480 Paroxysmal atrial fibrillation: Secondary | ICD-10-CM | POA: Diagnosis not present

## 2023-01-09 DIAGNOSIS — E782 Mixed hyperlipidemia: Secondary | ICD-10-CM | POA: Diagnosis not present

## 2023-01-12 ENCOUNTER — Inpatient Hospital Stay: Payer: PPO | Admitting: Internal Medicine

## 2023-01-12 ENCOUNTER — Inpatient Hospital Stay: Payer: PPO

## 2023-01-12 ENCOUNTER — Inpatient Hospital Stay: Payer: PPO | Attending: Internal Medicine

## 2023-01-12 VITALS — BP 154/60 | HR 50 | Temp 98.0°F | Wt 140.0 lb

## 2023-01-12 DIAGNOSIS — R2689 Other abnormalities of gait and mobility: Secondary | ICD-10-CM | POA: Insufficient documentation

## 2023-01-12 DIAGNOSIS — Z79899 Other long term (current) drug therapy: Secondary | ICD-10-CM | POA: Diagnosis not present

## 2023-01-12 DIAGNOSIS — D509 Iron deficiency anemia, unspecified: Secondary | ICD-10-CM | POA: Diagnosis not present

## 2023-01-12 DIAGNOSIS — E538 Deficiency of other specified B group vitamins: Secondary | ICD-10-CM

## 2023-01-12 DIAGNOSIS — D696 Thrombocytopenia, unspecified: Secondary | ICD-10-CM | POA: Insufficient documentation

## 2023-01-12 DIAGNOSIS — D508 Other iron deficiency anemias: Secondary | ICD-10-CM | POA: Diagnosis not present

## 2023-01-12 DIAGNOSIS — I10 Essential (primary) hypertension: Secondary | ICD-10-CM | POA: Diagnosis not present

## 2023-01-12 DIAGNOSIS — F419 Anxiety disorder, unspecified: Secondary | ICD-10-CM | POA: Insufficient documentation

## 2023-01-12 DIAGNOSIS — Z85828 Personal history of other malignant neoplasm of skin: Secondary | ICD-10-CM | POA: Diagnosis not present

## 2023-01-12 DIAGNOSIS — K2270 Barrett's esophagus without dysplasia: Secondary | ICD-10-CM | POA: Diagnosis not present

## 2023-01-12 LAB — CBC WITH DIFFERENTIAL/PLATELET
Abs Immature Granulocytes: 0.03 10*3/uL (ref 0.00–0.07)
Basophils Absolute: 0 10*3/uL (ref 0.0–0.1)
Basophils Relative: 1 %
Eosinophils Absolute: 0.3 10*3/uL (ref 0.0–0.5)
Eosinophils Relative: 3 %
HCT: 34.9 % — ABNORMAL LOW (ref 36.0–46.0)
Hemoglobin: 11.7 g/dL — ABNORMAL LOW (ref 12.0–15.0)
Immature Granulocytes: 0 %
Lymphocytes Relative: 21 %
Lymphs Abs: 1.5 10*3/uL (ref 0.7–4.0)
MCH: 27.9 pg (ref 26.0–34.0)
MCHC: 33.5 g/dL (ref 30.0–36.0)
MCV: 83.1 fL (ref 80.0–100.0)
Monocytes Absolute: 0.7 10*3/uL (ref 0.1–1.0)
Monocytes Relative: 9 %
Neutro Abs: 4.8 10*3/uL (ref 1.7–7.7)
Neutrophils Relative %: 66 %
Platelets: 145 10*3/uL — ABNORMAL LOW (ref 150–400)
RBC: 4.2 MIL/uL (ref 3.87–5.11)
RDW: 12.7 % (ref 11.5–15.5)
WBC: 7.3 10*3/uL (ref 4.0–10.5)
nRBC: 0 % (ref 0.0–0.2)

## 2023-01-12 LAB — IRON AND TIBC
Iron: 52 ug/dL (ref 28–170)
Saturation Ratios: 16 % (ref 10.4–31.8)
TIBC: 332 ug/dL (ref 250–450)
UIBC: 280 ug/dL

## 2023-01-12 LAB — FERRITIN: Ferritin: 91 ng/mL (ref 11–307)

## 2023-01-12 LAB — VITAMIN B12: Vitamin B-12: 333 pg/mL (ref 180–914)

## 2023-01-12 MED ORDER — ONDANSETRON HCL 4 MG PO TABS: 4.0000 mg | ORAL_TABLET | Freq: Every day | ORAL | 0 refills | Status: DC | PRN

## 2023-01-12 NOTE — Progress Notes (Signed)
[facility]  Telephone:([phone]) [phone] Fax:(336) [phone]  ID: Katie Buckley OB: [birth year]  MR#: [id number]  GMW#:[id number]  Patient Care Team: Mort Sawyers, FNP as PCP - General (Family Medicine) Darliss Ridgel, MD as Referring Physician (Psychiatry) Lonell Face, MD as Consulting Physician (Neurology) Gaspar Cola, [place] (Inactive) (Pharmacist) Alwyn Pea, MD as Consulting Physician (Cardiology) Michaelyn Barter, MD as Consulting Physician (Oncology)   HPI: Katie Buckley is a 72 y.o. female with past medical history of aortic stenosis status post bioprosthetic valve in May 2023, Barrett's esophagus, anxiety, and skin cancer was referred to hematology for further work-up of worsening anemia.  Patient was treated with Feraheme x 2 doses with good response to iron.  She had colonoscopy with Dr. Tobi Bastos on 06/21/2022 which showed 5 mm polyp in the rectum otherwise was unremarkable.  Endoscopy done in June 2017 by Dr. Mechele Collin showed esophageal mucosal changes secondary to established short segment Barrett's esophagus, erythema and mild inflammation in the gastric antrum.  H. pylori was negative.   INTERVAL HISTORY-  Patient was seen today as follow-up for iron deficiency anemia and thrombocytopenia. She is feeling okay.  Reports yesterday had a fall and caught it with her hand.  Does not think has hit her head.  Recently had endoscopy with Dr. Tobi Bastos.  Also following with plastic surgery for breast procedure. Patient also having hair fall and her nails not growing.  We discussed about adding multivitamin and see if it helps.  Otherwise to discuss further with her dermatologist  REVIEW OF SYSTEMS:   Review of Systems  Constitutional:  Positive for malaise/fatigue. Negative for chills and fever.  Respiratory:  Negative for cough, hemoptysis and shortness of breath.   Gastrointestinal:  Negative for abdominal pain, blood in stool, constipation, nausea  and vomiting.  Musculoskeletal: Negative.   Neurological:  Negative for dizziness.    As per HPI. Otherwise, a complete review of systems is negative.  PAST MEDICAL HISTORY: Past Medical History:  Diagnosis Date   Anemia    Anxiety    Aortic stenosis 01/21/2013   s/p bioprosthetic TAVR 10/2021   Arthritis    Left Hip   Coronary artery disease    CABG 2023 x1   Diabetes mellitus without complication (HCC)    Diabetic neuropathy (HCC)    Diabetic retinopathy (HCC)    Frequent sinus infections    GERD (gastroesophageal reflux disease)    Headache    Tension headaches   Heart murmur    AVR 2023   History of Barrett's esophagus    History of colon polyps    Hyperchloremia    Hypertension    MRSA (methicillin resistant staph aureus) culture positive 2018   history of, in hand   Palpitations    Pneumonia    Hx   Seasonal allergies    Skin cancer    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    PAST SURGICAL HISTORY: Past Surgical History:  Procedure Laterality Date   ANTERIOR CERVICAL DECOMP/DISCECTOMY FUSION N/A 11/15/2020   Procedure: ANTERIOR CERVICAL DECOMPRESSION/DISCECTOMY FUSION 1 LEVEL C3/4;  Surgeon: Lucy Chris, MD;  Location: [facility] ORS;  Service: Neurosurgery;  Laterality: N/A;   AORTIC VALVE REPLACEMENT N/A 09/19/2021   Procedure: AORTIC VALVE REPLACEMENT Using 19mm Edwards Resilia Inspiris Valve;  Surgeon: Alleen Borne, MD;  Location: [facility] OR;  Service: Open Heart Surgery;  Laterality: N/A;   AUGMENTATION MAMMAPLASTY Bilateral    breast implants 1980s   BIOPSY  12/18/2022   Procedure: BIOPSY;  Surgeon: Wyline Mood, MD;  Location: [facility] ENDOSCOPY;  Service: Gastroenterology;;   Bone Spur  2007   foot   BREAST IMPLANT REMOVAL Bilateral 10/30/2022   Procedure: Bilateral removal of breast implants with mastopexy;  Surgeon: Peggye Form, DO;  Location: [facility] OR;  Service: Plastics;  Laterality: Bilateral;   CARDIAC CATHETERIZATION     Carpal Tunnel Symdrome   2008   as repeated in 2009   CATARACT EXTRACTION W/PHACO Left 01/16/2019   Procedure: CATARACT EXTRACTION PHACO AND INTRAOCULAR LENS PLACEMENT (IOC) LEFT DIABETES VISION BLUE;  Surgeon: Elliot Cousin, MD;  Location: [facility] ORS;  Service: Ophthalmology;  Laterality: Left;  [place]  00:47 CDE 6.62 Fluid pack lot # [id number]   CATARACT EXTRACTION W/PHACO Right 02/06/2019   Procedure: CATARACT EXTRACTION PHACO AND INTRAOCULAR LENS PLACEMENT (IOC);  Surgeon: Elliot Cousin, MD;  Location: [facility] ORS;  Service: Ophthalmology;  Laterality: Right;  [place] 00:54.6 CDE 6.52 FLUID PACK LOT # [id number]   CHOLECYSTECTOMY  2007   COLONOSCOPY WITH PROPOFOL N/A 11/17/2015   Procedure: COLONOSCOPY WITH PROPOFOL;  Surgeon: Scot Jun, MD;  Location: [facility] ENDOSCOPY;  Service: Endoscopy;  Laterality: N/A;   COLONOSCOPY WITH PROPOFOL N/A 06/21/2022   Procedure: COLONOSCOPY WITH PROPOFOL;  Surgeon: Wyline Mood, MD;  Location: [facility] ENDOSCOPY;  Service: Gastroenterology;  Laterality: N/A;   CORONARY ARTERY BYPASS GRAFT N/A 09/19/2021   Procedure: CORONARY ARTERY BYPASS GRAFTING X1 Using Left Internal Mammory artery;  Surgeon: Alleen Borne, MD;  [facility]: [facility];  Service: Open Heart Surgery;  Laterality: N/A;   CORONARY PRESSURE/FFR STUDY N/A 05/24/2021   Procedure: INTRAVASCULAR PRESSURE WIRE/FFR STUDY;  Surgeon: Alwyn Pea, MD;  Location: [facility] INVASIVE CV LAB;  Service: Cardiovascular;  Laterality: N/A;   ESOPHAGOGASTRODUODENOSCOPY (EGD) WITH PROPOFOL N/A 11/17/2015   Procedure: ESOPHAGOGASTRODUODENOSCOPY (EGD) WITH PROPOFOL;  Surgeon: Scot Jun, MD;  Location: [facility] ENDOSCOPY;  Service: Endoscopy;  Laterality: N/A;   ESOPHAGOGASTRODUODENOSCOPY (EGD) WITH PROPOFOL N/A 12/18/2022   Procedure: ESOPHAGOGASTRODUODENOSCOPY (EGD) WITH PROPOFOL;  Surgeon: Wyline Mood, MD;  Location: [facility] ENDOSCOPY;  Service: Gastroenterology;  Laterality: N/A;   MANDIBLE SURGERY     MASTOPEXY Bilateral 10/30/2022   Procedure:  MASTOPEXY;  Surgeon: Peggye Form, DO;  Location: [facility] OR;  Service: Plastics;  Laterality: Bilateral;   NASAL SINUS SURGERY  03/2013   Multile surgery Dr. Chestine Spore. 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 Katrinka Blazing   RIGHT/LEFT HEART CATH AND CORONARY ANGIOGRAPHY N/A 05/24/2021   Procedure: RIGHT/LEFT HEART CATH AND CORONARY ANGIOGRAPHY;  Surgeon: Alwyn Pea, MD;  Location: [facility] INVASIVE CV LAB;  Service: Cardiovascular;  Laterality: N/A;   TEE WITHOUT CARDIOVERSION N/A 09/19/2021   Procedure: TRANSESOPHAGEAL ECHOCARDIOGRAM (TEE);  Surgeon: Alleen Borne, MD;  Location: [facility] OR;  Service: Open Heart Surgery;  Laterality: N/A;   TONSILLECTOMY  1960    FAMILY HISTORY: Family History  Problem Relation Age of Onset   Lung cancer Mother    Diabetes Father    Irritable bowel syndrome Sister    Throat cancer Brother    Breast cancer Daughter    Breast cancer Maternal Aunt     HEALTH MAINTENANCE: Social History   Tobacco Use   Smoking status: Never   Smokeless tobacco: Never  Vaping Use   Vaping status: Never Used  Substance Use Topics   Alcohol use: No   Drug use: No     Allergies  Allergen Reactions  Doxycycline Other (See Comments)    Emotional changes/anxious   Biaxin [Clarithromycin]     GI upset, and bad taste in mouth.    Current Outpatient Medications  Medication Sig Dispense Refill   acetaminophen (TYLENOL) 650 MG CR tablet Take 1,300 mg by mouth every 8 (eight) hours as needed for pain (pain).     amLODipine (NORVASC) 5 MG tablet Take 1 tablet (5 mg total) by mouth daily. (Patient taking differently: Take 5 mg by mouth 2 (two) times daily.) 30 tablet 1   apixaban (ELIQUIS) 5 MG TABS tablet Take 1 tablet (5 mg total) by mouth 2 (two) times daily. 60 tablet 2   BIOTIN PO Take 1 tablet by mouth once a week.     Blood Glucose Monitoring Suppl (CONTOUR NEXT MONITOR) w/Device KIT Use to check blood sugar daily 1 kit 0    buPROPion (WELLBUTRIN SR) 150 MG 12 hr tablet Take 150 mg by mouth 2 (two) times daily.     butalbital-acetaminophen-caffeine (FIORICET) 50-325-40 MG tablet Take 1 tablet by mouth every 6 (six) hours as needed for headache. 30 tablet 4   ezetimibe (ZETIA) 10 MG tablet Take 1 tablet (10 mg total) by mouth at bedtime. 90 tablet 3   fexofenadine (ALLEGRA) 180 MG tablet Take 180 mg by mouth daily as needed for allergies or rhinitis.     FLUoxetine (PROZAC) 40 MG capsule Take 40 mg by mouth daily.     fluticasone (FLONASE) 50 MCG/ACT nasal spray Place 1 spray into both nostrils daily as needed for allergies or rhinitis.     glipiZIDE (GLUCOTROL) 5 MG tablet Take 1 tablet (5 mg total) by mouth daily. 90 tablet 3   glucose blood (CONTOUR NEXT TEST) test strip Use to check blood sugar once daily 100 each 3   Lancet Devices MISC      metFORMIN (GLUCOPHAGE-XR) 500 MG 24 hr tablet TAKE 2 TABLETS BY MOUTH EVERY DAY 180 tablet 3   metoprolol tartrate (LOPRESSOR) 50 MG tablet Take 1 tablet (50 mg total) by mouth 2 (two) times daily. 60 tablet 2   montelukast (SINGULAIR) 10 MG tablet Take 1 tablet (10 mg total) by mouth at bedtime as needed (allergies). Home med.     ondansetron (ZOFRAN ODT) 4 MG disintegrating tablet Take 1 tablet (4 mg total) by mouth every 8 (eight) hours as needed for nausea or vomiting. 20 tablet 3   ondansetron (ZOFRAN) 4 MG tablet Take 1 tablet (4 mg total) by mouth daily as needed for nausea or vomiting. 90 tablet 0   pregabalin (LYRICA) 75 MG capsule Take 1 capsule (75 mg total) by mouth 3 (three) times daily.     rosuvastatin (CRESTOR) 20 MG tablet TAKE 1 TABLET BY MOUTH EVERY DAY IN THE EVENING 90 tablet 4   traZODone (DESYREL) 50 MG tablet Take 50-100 mg by mouth at bedtime.     RABEprazole (ACIPHEX) 20 MG tablet Take 1 tablet (20 mg total) by mouth 2 times daily at 12 noon and 4 pm. (Patient not taking: Reported on 11/21/2022) 180 tablet 3   No current facility-administered  medications for this visit.    OBJECTIVE: Vitals:   01/12/23 1324  BP: (!) 154/60  Pulse: (!) 50  Temp: 98 F (36.7 C)  SpO2: 99%      Body mass index is 27.34 kg/m.      General: Well-developed, well-nourished, no acute distress. Eyes: Pink conjunctiva, anicteric sclera. HEENT: Oropharynx clear Lungs: Clear to auscultation bilaterally. Heart:  Regular rate and rhythm. No rubs, murmurs, or gallops. Abdomen: Soft, nontender, nondistended. No organomegaly noted, normoactive bowel sounds. Musculoskeletal: No edema, cyanosis, or clubbing. Neuro: Alert, answering all questions appropriately. Cranial nerves grossly intact. Skin: No rashes or petechiae noted. Psych: Normal affect. Lymphatics: Right-sided neck swelling has improved.  Continues to have palpable about 2 to 3 cm right cervical lymph node.  Tenderness improved.   LAB RESULTS:  Lab Results  Component Value Date   NA 136 10/24/2022   K 4.2 10/24/2022   CL 102 10/24/2022   CO2 27 10/24/2022   GLUCOSE 145 (H) 10/24/2022   BUN 16 10/24/2022   CREATININE 0.78 10/24/2022   CALCIUM 9.4 10/24/2022   PROT 5.8 (L) 09/10/2022   ALBUMIN 3.6 09/10/2022   AST 19 09/10/2022   ALT 20 09/10/2022   ALKPHOS 72 09/10/2022   BILITOT 0.6 09/10/2022   GFRNONAA >60 10/24/2022   GFRAA 89 11/19/2019    Lab Results  Component Value Date   WBC 7.3 01/12/2023   NEUTROABS 4.8 01/12/2023   HGB 11.7 (L) 01/12/2023   HCT 34.9 (L) 01/12/2023   MCV 83.1 01/12/2023   PLT 145 (L) 01/12/2023    Lab Results  Component Value Date   TIBC 332 01/12/2023   TIBC 333 09/12/2022   TIBC 322 06/15/2022   FERRITIN 91 01/12/2023   FERRITIN 165 09/12/2022   FERRITIN 310 (H) 06/15/2022   IRONPCTSAT 16 01/12/2023   IRONPCTSAT 20 09/12/2022   IRONPCTSAT 23 06/15/2022     STUDIES: No results found.  ASSESSMENT AND PLAN:   KELIE GAINEY is a 72 y.o. female with pmh of aortic stenosis status post bioprosthetic valve in May 2023, Barrett's  esophagus, anxiety, and skin cancer follows with hematology for iron deficiency anemia.  #Iron deficiency anemia -Completed 2 doses of IV Feraheme in October 2023.  Repeat iron panel showed good response to the treatment.  -s/p colonoscopy with Dr. Tobi Bastos in January 2024 which showed a 5 mm polyp in the rectum otherwise unremarkable. Endoscopy done in June 2017 by Dr. Mechele Collin showed esophageal mucosal changes secondary to established short segment Barrett's esophagus, erythema and mild inflammation in the gastric antrum.  H. pylori was negative.   -Endoscopy repeated with Dr. Tobi Bastos in July 2024 Which showed esophageal mucosal changes suspicious for Barrett's esophagus, large amount of food was present in the entire stomach.  Pathology was negative for Barrett's, dysplasia or malignancy. -Labs rechecked today.  Hemoglobin is 11.7.  Iron panel is normal.  B12 pending.  # Thrombocytopenia -mild.  Platelets today 145.  Monitor.  CT abdomen from September 2023 showed normal liver and spleen.  #Hypertension-  per Cardiology  #Balance problems, falls - follows with Dr. Sherryll Burger, neurology.   RTC in 6 months for MD visit, labs.  Patient expressed understanding and was in agreement with this plan. She also understands that She can call clinic at any time with any questions, concerns, or complaints.   I spent a total of 25 minutes reviewing chart data, face-to-face evaluation with the patient, counseling and coordination of care as detailed above.  Michaelyn Barter, MD   01/12/2023 3:46 PM

## 2023-01-12 NOTE — Addendum Note (Signed)
Addended byMichaelyn Barter on: 01/12/2023 04:05 PM   Modules accepted: Orders

## 2023-01-12 NOTE — Progress Notes (Signed)
Patient says that her hair continues to fall out along with her nails are not growing. Energy level has decreased. Blood pressure isn't stable. Patient states that no one will refill her Zofran medication. Her insurance says the reason for her to have the zofran is not accurate.

## 2023-01-23 ENCOUNTER — Ambulatory Visit: Payer: PPO | Admitting: Plastic Surgery

## 2023-01-23 DIAGNOSIS — T8543XA Leakage of breast prosthesis and implant, initial encounter: Secondary | ICD-10-CM

## 2023-01-23 DIAGNOSIS — Z9889 Other specified postprocedural states: Secondary | ICD-10-CM

## 2023-01-23 NOTE — Progress Notes (Signed)
   Subjective:    Patient ID: Katie Buckley, female    DOB: [birth year], 72 y.o.   MRN: [id number]  The patient is a 72 year old female here for follow-up after undergoing removal of bilateral implants with complete capsulectomies in May 2024.  Overall the patient is doing really well there is no sign of infection.  There is no sign of a hematoma or seroma.  The patient still has a little asymmetry like she did prior to the surgery.  Patient has a little more volume on the left compared to the right which was noted preoperatively.  She seems to have settled more since her first postop visit.  It is looking better and softer.     Review of Systems  Constitutional: Negative.   Eyes: Negative.   Respiratory: Negative.    Cardiovascular: Negative.   Endocrine: Negative.   Genitourinary: Negative.        Objective:   Physical Exam Constitutional:      Appearance: Normal appearance.  Cardiovascular:     Rate and Rhythm: Normal rate.     Pulses: Normal pulses.  Pulmonary:     Effort: Pulmonary effort is normal.  Neurological:     Mental Status: She is alert and oriented to person, place, and time.  Psychiatric:        Mood and Affect: Mood normal.        Behavior: Behavior normal.        Thought Content: Thought content normal.        Judgment: Judgment normal.        Assessment & Plan:     ICD-10-CM   1. Rupture of implant of right breast, initial encounter  T85.43XA        Will need to give it a little more time to settle also have encouraged her to increase her protein and decrease her carbs and sugars.  Will reevaluate in 6 months.  The option would be to do fat grafting in the right breast or decrease the left breast.  Pictures were obtained of the patient and placed in the chart with the patient's or guardian's permission.

## 2023-01-26 ENCOUNTER — Other Ambulatory Visit: Payer: Self-pay | Admitting: Family

## 2023-01-26 DIAGNOSIS — E1140 Type 2 diabetes mellitus with diabetic neuropathy, unspecified: Secondary | ICD-10-CM

## 2023-02-05 ENCOUNTER — Ambulatory Visit: Payer: PPO | Admitting: Urology

## 2023-02-08 ENCOUNTER — Ambulatory Visit: Payer: PPO | Admitting: Family Medicine

## 2023-02-08 DIAGNOSIS — J329 Chronic sinusitis, unspecified: Secondary | ICD-10-CM | POA: Diagnosis not present

## 2023-02-08 DIAGNOSIS — R001 Bradycardia, unspecified: Secondary | ICD-10-CM | POA: Diagnosis not present

## 2023-02-20 ENCOUNTER — Ambulatory Visit: Payer: PPO | Admitting: Gastroenterology

## 2023-02-20 ENCOUNTER — Encounter: Payer: Self-pay | Admitting: Gastroenterology

## 2023-02-20 VITALS — BP 164/60 | HR 55 | Temp 98.1°F | Wt 143.6 lb

## 2023-02-20 DIAGNOSIS — K219 Gastro-esophageal reflux disease without esophagitis: Secondary | ICD-10-CM | POA: Diagnosis not present

## 2023-02-20 DIAGNOSIS — E1143 Type 2 diabetes mellitus with diabetic autonomic (poly)neuropathy: Secondary | ICD-10-CM | POA: Diagnosis not present

## 2023-02-20 DIAGNOSIS — Z7984 Long term (current) use of oral hypoglycemic drugs: Secondary | ICD-10-CM

## 2023-02-20 DIAGNOSIS — K3184 Gastroparesis: Secondary | ICD-10-CM

## 2023-02-20 MED ORDER — RABEPRAZOLE SODIUM 20 MG PO TBEC: 20.0000 mg | DELAYED_RELEASE_TABLET | Freq: Two times a day (BID) | ORAL | 3 refills | Status: AC

## 2023-02-20 NOTE — Progress Notes (Signed)
Katie Mood MD, MRCP(U.K) [address]  [address]  [place], [place] [address]  Main: [phone]  Fax: [phone]   Primary Care Physician: Katie Sawyers, FNP  Primary Gastroenterologist:  Dr. Wyline Buckley   Chief Complaint  Patient presents with   Gastroesophageal Reflux   IDA    HPI: Katie Buckley is a 72 y.o. female  Summary of history : Initially seen on 10/23/2022, for GERD.  Prior history of possible Barrett's esophagus. Follows with Dr. Alena Bills in hematology for iron deficiency anemia on IV iron.   11/17/2015 EGD: 2 cm length salmon-colored mucosa seen biopsies were taken that showed reflux esophagitis but no evidence of intestinal metaplasia or Barrett's esophagus.  Lung 06/21/2022 colonoscopy: 5 mm polyp resected in the rectum sessile.  Hyperplastic 09/12/2022 hemoglobin 13.4 g MCV 87.9 ferritin 165 B12 normal.  On rabeprazole 20 mg twice daily   Interval history   10/23/2022-02/20/2023  12/18/2022: EGD: Possible short segment Barrett's esophagus in the GE junction 1 cm in length large amount of food was seen in the stomach no obstruction.  Biopsies of the salmon-colored mucosa showed chronic gastritis no evidence of intestinal metaplasia.  She feels bloated all the time.  Has been consuming a lot of fruit such as grapes.  Finds a bit hard to follow a gastroparesis diet.   Current Outpatient Medications  Medication Sig Dispense Refill   acetaminophen (TYLENOL) 650 MG CR tablet Take 1,300 mg by mouth every 8 (eight) hours as needed for pain (pain).     amLODipine (NORVASC) 5 MG tablet Take 1 tablet (5 mg total) by mouth daily. (Patient taking differently: Take 5 mg by mouth 2 (two) times daily.) 30 tablet 1   apixaban (ELIQUIS) 5 MG TABS tablet Take 1 tablet (5 mg total) by mouth 2 (two) times daily. 60 tablet 2   BIOTIN PO Take 1 tablet by mouth once a week.     Blood Glucose Monitoring Suppl (CONTOUR NEXT MONITOR) w/Device KIT Use to check blood sugar daily  1 kit 0   buPROPion (WELLBUTRIN SR) 150 MG 12 hr tablet Take 150 mg by mouth 2 (two) times daily.     butalbital-acetaminophen-caffeine (FIORICET) 50-325-40 MG tablet Take 1 tablet by mouth every 6 (six) hours as needed for headache. 30 tablet 4   ezetimibe (ZETIA) 10 MG tablet Take 1 tablet (10 mg total) by mouth at bedtime. 90 tablet 3   fexofenadine (ALLEGRA) 180 MG tablet Take 180 mg by mouth daily as needed for allergies or rhinitis.     FLUoxetine (PROZAC) 40 MG capsule Take 40 mg by mouth daily.     fluticasone (FLONASE) 50 MCG/ACT nasal spray Place 1 spray into both nostrils daily as needed for allergies or rhinitis.     glipiZIDE (GLUCOTROL) 5 MG tablet Take 1 tablet (5 mg total) by mouth daily. 90 tablet 3   glucose blood (CONTOUR NEXT TEST) test strip Use to check blood sugar once daily 100 each 3   Lancet Devices MISC      metFORMIN (GLUCOPHAGE-XR) 500 MG 24 hr tablet TAKE 2 TABLETS BY MOUTH EVERY DAY 180 tablet 3   metoprolol tartrate (LOPRESSOR) 50 MG tablet Take 1 tablet (50 mg total) by mouth 2 (two) times daily. 60 tablet 2   montelukast (SINGULAIR) 10 MG tablet Take 1 tablet (10 mg total) by mouth at bedtime as needed (allergies). Home med.     ondansetron (ZOFRAN ODT) 4 MG disintegrating tablet Take 1 tablet (4  mg total) by mouth every 8 (eight) hours as needed for nausea or vomiting. 20 tablet 3   ondansetron (ZOFRAN) 4 MG tablet Take 1 tablet (4 mg total) by mouth daily as needed for nausea or vomiting. 90 tablet 0   pregabalin (LYRICA) 75 MG capsule Take 1 capsule (75 mg total) by mouth 3 (three) times daily.     RABEprazole (ACIPHEX) 20 MG tablet Take 1 tablet (20 mg total) by mouth 2 times daily at 12 noon and 4 pm. 180 tablet 3   rosuvastatin (CRESTOR) 20 MG tablet TAKE 1 TABLET BY MOUTH EVERY DAY IN THE EVENING 90 tablet 4   traZODone (DESYREL) 50 MG tablet Take 50-100 mg by mouth at bedtime.     No current facility-administered medications for this visit.     Allergies as of 02/20/2023 - Review Complete 02/20/2023  Allergen Reaction Noted   Doxycycline Other (See Comments) 01/08/2018   Biaxin [clarithromycin]  12/16/2014      ROS:  General: Negative for anorexia, weight loss, fever, chills, fatigue, weakness. ENT: Negative for hoarseness, difficulty swallowing , nasal congestion. CV: Negative for chest pain, angina, palpitations, dyspnea on exertion, peripheral edema.  Respiratory: Negative for dyspnea at rest, dyspnea on exertion, cough, sputum, wheezing.  GI: See history of present illness. GU:  Negative for dysuria, hematuria, urinary incontinence, urinary frequency, nocturnal urination.  Endo: Negative for unusual weight change.    Physical Examination:   BP (!) 156/71   Pulse 65   Temp 98.1 F (36.7 C) (Oral)   Wt 143 lb 9.6 oz (65.1 kg)   BMI 28.04 kg/m   General: Well-nourished, well-developed in no acute distress.  Eyes: No icterus. Conjunctivae pink. Mouth: Oropharyngeal mucosa moist and pink , no lesions erythema or exudate. Neuro: Alert and oriented x 3.  Grossly intact. Skin: Warm and dry, no jaundice.   Psych: Alert and cooperative, normal Buckley and affect.   Imaging Studies: No results found.  Assessment and Plan:   Katie Buckley is a 72 y.o. y/o female here to see me for reflux.  History of aortic stenosis follows with Dr. Alena Bills for iron deficiency anemia that has resolved with IV iron.  Likely has reflux from gastroparesis secondary to diabetes.  Discussed about a gastroparesis diet.  She has information but practically finds it a bit hard to follow the diet.  I will refer to a dietitian to see if they can provide a more practical diet that is doable.  In the interim she should continue on rabeprazole once a day 20 mg.  We discussed about as needed use of Reglan but due to side effects she chose not to proceed    Dr Katie Mood  MD,MRCP [facility]) Follow up in 4 months with Celso Amy

## 2023-02-22 DIAGNOSIS — F331 Major depressive disorder, recurrent, moderate: Secondary | ICD-10-CM | POA: Diagnosis not present

## 2023-02-22 DIAGNOSIS — G4700 Insomnia, unspecified: Secondary | ICD-10-CM | POA: Diagnosis not present

## 2023-02-22 DIAGNOSIS — F411 Generalized anxiety disorder: Secondary | ICD-10-CM | POA: Diagnosis not present

## 2023-02-22 DIAGNOSIS — R41840 Attention and concentration deficit: Secondary | ICD-10-CM | POA: Diagnosis not present

## 2023-02-23 ENCOUNTER — Other Ambulatory Visit: Payer: Self-pay | Admitting: Internal Medicine

## 2023-02-24 ENCOUNTER — Encounter: Payer: Self-pay | Admitting: Internal Medicine

## 2023-03-04 DIAGNOSIS — R41840 Attention and concentration deficit: Secondary | ICD-10-CM | POA: Diagnosis not present

## 2023-03-04 DIAGNOSIS — Z79899 Other long term (current) drug therapy: Secondary | ICD-10-CM | POA: Diagnosis not present

## 2023-03-04 DIAGNOSIS — G4700 Insomnia, unspecified: Secondary | ICD-10-CM | POA: Diagnosis not present

## 2023-03-04 DIAGNOSIS — F411 Generalized anxiety disorder: Secondary | ICD-10-CM | POA: Diagnosis not present

## 2023-03-04 DIAGNOSIS — F331 Major depressive disorder, recurrent, moderate: Secondary | ICD-10-CM | POA: Diagnosis not present

## 2023-03-08 ENCOUNTER — Other Ambulatory Visit: Payer: Self-pay | Admitting: Family

## 2023-03-08 DIAGNOSIS — G44229 Chronic tension-type headache, not intractable: Secondary | ICD-10-CM

## 2023-03-15 ENCOUNTER — Telehealth: Payer: Self-pay

## 2023-03-15 NOTE — Patient Outreach (Signed)
Care Coordination   Initial Visit Note   03/15/2023 Name: Katie Buckley MRN: [id number] DOB: 1951/04/17  Katie Buckley is a 72 y.o. year old female who sees Katie Buckley, [place], FNP for primary care. I spoke with  Katie Buckley by phone today.  What matters to the patients health and wellness today?  Patient denies having any nursing needs.  Patient request listing of senior housing residencies.      Goals Addressed             This Visit's Progress    COMPLETED: Care coordination activities - no follow up needed       Interventions Today    Flowsheet Row Most Recent Value  General Interventions   General Interventions Discussed/Reviewed General Interventions Discussed, Vaccines, Community Resources  [Care coordination services discussed. SDOH survey completed. AWV discussed and patient advised to contact provider office to schedule. Discussed vaccines. Advised to contact PCP office if care coordination services needed in the future.]  Vaccines Flu, Pneumonia, RSV  Education Interventions   Education Provided Provided Education  [Senior housing listing/ packet mailed to patient]              SDOH assessments and interventions completed:  Yes  SDOH Interventions Today    Flowsheet Row Most Recent Value  SDOH Interventions   Food Insecurity Interventions Intervention Not Indicated  Housing Interventions Intervention Not Indicated  Transportation Interventions Intervention Not Indicated        Care Coordination Interventions:  Yes, provided   Follow up plan: No further intervention required.   Encounter Outcome:  Patient Visit Completed   [facility] Care Coordination [phone] direct line

## 2023-03-16 DIAGNOSIS — F411 Generalized anxiety disorder: Secondary | ICD-10-CM | POA: Diagnosis not present

## 2023-03-16 DIAGNOSIS — F331 Major depressive disorder, recurrent, moderate: Secondary | ICD-10-CM | POA: Diagnosis not present

## 2023-03-20 ENCOUNTER — Ambulatory Visit: Payer: PPO

## 2023-03-22 ENCOUNTER — Other Ambulatory Visit: Payer: Self-pay | Admitting: Family Medicine

## 2023-03-22 ENCOUNTER — Other Ambulatory Visit: Payer: Self-pay | Admitting: Internal Medicine

## 2023-03-23 ENCOUNTER — Encounter: Payer: Self-pay | Admitting: Family

## 2023-03-26 ENCOUNTER — Ambulatory Visit: Payer: PPO | Admitting: Urology

## 2023-03-26 ENCOUNTER — Encounter: Payer: Self-pay | Admitting: Urology

## 2023-03-26 VITALS — BP 161/71 | HR 73

## 2023-03-26 DIAGNOSIS — N3941 Urge incontinence: Secondary | ICD-10-CM | POA: Diagnosis not present

## 2023-03-26 DIAGNOSIS — R32 Unspecified urinary incontinence: Secondary | ICD-10-CM | POA: Diagnosis not present

## 2023-03-26 LAB — URINALYSIS, COMPLETE
Bilirubin, UA: NEGATIVE
Ketones, UA: NEGATIVE
Leukocytes,UA: NEGATIVE
Nitrite, UA: NEGATIVE
Protein,UA: NEGATIVE
RBC, UA: NEGATIVE
Specific Gravity, UA: <1.005 — ABNORMAL LOW (ref 1.005–1.030)
Urobilinogen, Ur: 0.2 mg/dL (ref 0.2–1.0)
pH, UA: 5.5 (ref 5.0–7.5)

## 2023-03-26 LAB — MICROSCOPIC EXAMINATION: Bacteria, UA: NONE SEEN

## 2023-03-26 MED ORDER — GEMTESA 75 MG PO TABS: 1.0000 | ORAL_TABLET | Freq: Every day | ORAL | Status: DC

## 2023-03-26 NOTE — Addendum Note (Signed)
Addended by: Sueanne Margarita on: 03/26/2023 03:48 PM   Modules accepted: Orders

## 2023-03-26 NOTE — Patient Instructions (Signed)

## 2023-03-26 NOTE — Telephone Encounter (Signed)
Spoke with pt. States that she is still taking Irbestartan 75mg  at bedtime.

## 2023-03-26 NOTE — Progress Notes (Signed)
03/26/2023 3:25 PM   Katie Buckley 29-Mar-1951 [id number]  Referring provider: Mort Sawyers, FNP [address] [place],  [place] [address]  No chief complaint on file.   HPI: I was consulted to assess the patient's urinary incontinence.  She has urge incontinence not wearing pads.  No stress incontinence.  No bedwetting  On further questioning she rarely leaks a few drops with a sneeze.  Although she does not wear a pad she has experienced high-volume episodes at work associated with urgency  She voids every 2-3 hours and gets up once or twice at night  She gets 1 bladder infection year.  No hysterectomy  She is on oral hypoglycemics.  No history of kidney stones or bladder surgery.  No previous treatment   PMH: Past Medical History:  Diagnosis Date   Anemia    Anxiety    Aortic stenosis 01/21/2013   s/p bioprosthetic TAVR 10/2021   Arthritis    Left Hip   Coronary artery disease    CABG 2023 x1   Diabetes mellitus without complication (HCC)    Diabetic neuropathy (HCC)    Diabetic retinopathy (HCC)    Frequent sinus infections    GERD (gastroesophageal reflux disease)    Headache    Tension headaches   Heart murmur    AVR 2023   History of Barrett's esophagus    History of colon polyps    Hyperchloremia    Hypertension    MRSA (methicillin resistant staph aureus) culture positive 2018   history of, in hand   Palpitations    Pneumonia    Hx   Seasonal allergies    Skin cancer    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    Surgical History: Past Surgical History:  Procedure Laterality Date   ANTERIOR CERVICAL DECOMP/DISCECTOMY FUSION N/A 11/15/2020   Procedure: ANTERIOR CERVICAL DECOMPRESSION/DISCECTOMY FUSION 1 LEVEL C3/4;  Surgeon: Lucy Chris, MD;  Location: [facility] ORS;  Service: Neurosurgery;  Laterality: N/A;   AORTIC VALVE REPLACEMENT N/A 09/19/2021   Procedure: AORTIC VALVE REPLACEMENT Using 19mm Edwards Resilia Inspiris Valve;  Surgeon:  Alleen Borne, MD;  Location: [facility];  Service: Open Heart Surgery;  Laterality: N/A;   AUGMENTATION MAMMAPLASTY Bilateral    breast implants 1980s   BIOPSY  12/18/2022   Procedure: BIOPSY;  Surgeon: Wyline Mood, MD;  Location: [facility]COPY;  Service: Gastroenterology;;   Bone Spur  2007   foot   BREAST IMPLANT REMOVAL Bilateral 10/30/2022   Procedure: Bilateral removal of breast implants with mastopexy;  Surgeon: Peggye Form, DO;  Location: [facility] OR;  Service: Plastics;  Laterality: Bilateral;   CARDIAC CATHETERIZATION     Carpal Tunnel Symdrome  2008   as repeated in 2009   CATARACT EXTRACTION W/PHACO Left 01/16/2019   Procedure: CATARACT EXTRACTION PHACO AND INTRAOCULAR LENS PLACEMENT (IOC) LEFT DIABETES VISION BLUE;  Surgeon: Elliot Cousin, MD;  Location: [facility] ORS;  Service: Ophthalmology;  Laterality: Left;  [place]  00:47 CDE 6.62 Fluid pack lot # [id number]   CATARACT EXTRACTION W/PHACO Right 02/06/2019   Procedure: CATARACT EXTRACTION PHACO AND INTRAOCULAR LENS PLACEMENT (IOC);  Surgeon: Elliot Cousin, MD;  Location: [facility] OR[facility];  Service: Ophthalmology;  Laterality: Right;  [place] 00:54.6 CDE 6.52 FLUID PACK LOT # [id number]   CHOLECYSTECTOMY  2007   COLONOSCOPY WITH PROPOFOL N/A 11/17/2015   Procedure: COLONOSCOPY WITH PROPOFOL;  Surgeon: Scot Jun, MD;  Location: [facility] ENDOSCOPY;  Service: Endoscopy;  Laterality:  N/A;   COLONOSCOPY WITH PROPOFOL N/A 06/21/2022   Procedure: COLONOSCOPY WITH PROPOFOL;  Surgeon: Wyline Mood, MD;  Location: [facility] ENDOSCOPY;  Service: Gastroenterology;  Laterality: N/A;   CORONARY ARTERY BYPASS GRAFT N/A 09/19/2021   Procedure: CORONARY ARTERY BYPASS GRAFTING X1 Using Left Internal Mammory artery;  Surgeon: Alleen Borne, MD;  [facility];  Service: Open Heart Surgery;  Laterality: N/A;   CORONARY PRESSURE/FFR STUDY N/A 05/24/2021   Procedure: INTRAVASCULAR PRESSURE WIRE/FFR STUDY;  Surgeon: Alwyn Pea, MD;  Location: [facility] INVASIVE CV LAB;   Service: Cardiovascular;  Laterality: N/A;   ESOPHAGOGASTRODUODENOSCOPY (EGD) WITH PROPOFOL N/A 11/17/2015   Procedure: ESOPHAGOGASTRODUODENOSCOPY (EGD) WITH PROPOFOL;  Surgeon: Scot Jun, MD;  Location: [facility] ENDOSCOPY;  Service: Endoscopy;  Laterality: N/A;   ESOPHAGOGASTRODUODENOSCOPY (EGD) WITH PROPOFOL N/A 12/18/2022   Procedure: ESOPHAGOGASTRODUODENOSCOPY (EGD) WITH PROPOFOL;  Surgeon: Wyline Mood, MD;  Location: [facility] ENDOSCOPY;  Service: Gastroenterology;  Laterality: N/A;   MANDIBLE SURGERY     MASTOPEXY Bilateral 10/30/2022   Procedure: MASTOPEXY;  Surgeon: Peggye Form, DO;  Location: [facility];  Service: Plastics;  Laterality: Bilateral;   NASAL SINUS SURGERY  03/2013   Multile surgery Dr. Chestine Spore. 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 Katrinka Blazing   RIGHT/LEFT HEART CATH AND CORONARY ANGIOGRAPHY N/A 05/24/2021   Procedure: RIGHT/LEFT HEART CATH AND CORONARY ANGIOGRAPHY;  Surgeon: Alwyn Pea, MD;  Location: [facility] INVASIVE CV LAB;  Service: Cardiovascular;  Laterality: N/A;   TEE WITHOUT CARDIOVERSION N/A 09/19/2021   Procedure: TRANSESOPHAGEAL ECHOCARDIOGRAM (TEE);  Surgeon: Alleen Borne, MD;  Location: [facility] OR;  Service: Open Heart Surgery;  Laterality: N/A;   TONSILLECTOMY  1960    Home Medications:  Allergies as of 03/26/2023       Reactions   Doxycycline Other (See Comments)   Emotional changes/anxious   Biaxin [clarithromycin]    GI upset, and bad taste in mouth.        Medication List        Accurate as of March 26, 2023  3:25 PM. If you have any questions, ask your nurse or doctor.          acetaminophen 650 MG CR tablet Commonly known as: TYLENOL Take 1,300 mg by mouth every 8 (eight) hours as needed for pain (pain).   amLODipine 5 MG tablet Commonly known as: NORVASC Take 1 tablet (5 mg total) by mouth daily. What changed: when to take this   apixaban 5 MG Tabs tablet Commonly known as:  ELIQUIS Take 1 tablet (5 mg total) by mouth 2 (two) times daily.   BIOTIN PO Take 1 tablet by mouth once a week.   buPROPion 150 MG 12 hr tablet Commonly known as: WELLBUTRIN SR Take 150 mg by mouth 2 (two) times daily.   butalbital-acetaminophen-caffeine 50-325-40 MG tablet Commonly known as: FIORICET TAKE 1 TABLET BY MOUTH EVERY 6 HOURS AS NEEDED FOR HEADACHE.   Contour Next Monitor w/Device Kit Use to check blood sugar daily   Contour Next Test test strip Generic drug: glucose blood Use to check blood sugar once daily   ezetimibe 10 MG tablet Commonly known as: ZETIA Take 1 tablet (10 mg total) by mouth at bedtime.   fexofenadine 180 MG tablet Commonly known as: ALLEGRA Take 180 mg by mouth daily as needed for allergies or rhinitis.   FLUoxetine 40 MG capsule Commonly known as: PROZAC Take 40 mg by mouth daily.   fluticasone  50 MCG/ACT nasal spray Commonly known as: FLONASE Place 1 spray into both nostrils daily as needed for allergies or rhinitis.   glipiZIDE 5 MG tablet Commonly known as: GLUCOTROL Take 1 tablet (5 mg total) by mouth daily.   Lancet Devices Misc   metFORMIN 500 MG 24 hr tablet Commonly known as: GLUCOPHAGE-XR TAKE 2 TABLETS BY MOUTH EVERY DAY   metoprolol tartrate 50 MG tablet Commonly known as: LOPRESSOR Take 1 tablet (50 mg total) by mouth 2 (two) times daily.   montelukast 10 MG tablet Commonly known as: SINGULAIR Take 1 tablet (10 mg total) by mouth at bedtime as needed (allergies). Home med.   ondansetron 4 MG disintegrating tablet Commonly known as: Zofran ODT Take 1 tablet (4 mg total) by mouth every 8 (eight) hours as needed for nausea or vomiting.   ondansetron 4 MG tablet Commonly known as: ZOFRAN TAKE 1 TABLET BY MOUTH DAILY AS NEEDED FOR NAUSEA OR VOMITING.   pregabalin 75 MG capsule Commonly known as: Lyrica Take 1 capsule (75 mg total) by mouth 3 (three) times daily.   RABEprazole 20 MG tablet Commonly known as:  ACIPHEX Take 1 tablet (20 mg total) by mouth 2 times daily at 12 noon and 4 pm.   rosuvastatin 20 MG tablet Commonly known as: CRESTOR TAKE 1 TABLET BY MOUTH EVERY DAY IN THE EVENING   traZODone 50 MG tablet Commonly known as: DESYREL Take 50-100 mg by mouth at bedtime.        Allergies:  Allergies  Allergen Reactions   Doxycycline Other (See Comments)    Emotional changes/anxious   Biaxin [Clarithromycin]     GI upset, and bad taste in mouth.    Family History: Family History  Problem Relation Age of Onset   Lung cancer Mother    Diabetes Father    Irritable bowel syndrome Sister    Throat cancer Brother    Breast cancer Daughter    Breast cancer Maternal Aunt     Social History:  reports that she has never smoked. She has never used smokeless tobacco. She reports that she does not drink alcohol and does not use drugs.  ROS:                                        Physical Exam: There were no vitals taken for this visit.  Constitutional:  Alert and oriented, No acute distress. HEENT: [place] AT, moist mucus membranes.  Trachea midline, no masses.   Laboratory Data: Lab Results  Component Value Date   WBC 7.3 01/12/2023   HGB 11.7 (L) 01/12/2023   HCT 34.9 (L) 01/12/2023   MCV 83.1 01/12/2023   PLT 145 (L) 01/12/2023    Lab Results  Component Value Date   CREATININE 0.78 10/24/2022    No results found for: "PSA"  No results found for: "TESTOSTERONE"  Lab Results  Component Value Date   HGBA1C 8.1 (H) 10/24/2022    Urinalysis    Component Value Date/Time   COLORURINE YELLOW (A) 10/26/2021 0543   APPEARANCEUR CLEAR (A) 10/26/2021 0543   LABSPEC 1.015 10/26/2021 0543   PHURINE 5.0 10/26/2021 0543   GLUCOSEU >=500 (A) 10/26/2021 0543   HGBUR NEGATIVE 10/26/2021 0543   BILIRUBINUR NEGATIVE 10/26/2021 0543   BILIRUBINUR negative 05/18/2021 1415   BILIRUBINUR Neg 05/28/2015 1621   KETONESUR 5 (A) 10/26/2021 0543   PROTEINUR  NEGATIVE  10/26/2021 0543   UROBILINOGEN 0.2 05/18/2021 1415   NITRITE NEGATIVE 10/26/2021 0543   LEUKOCYTESUR NEGATIVE 10/26/2021 0543    Pertinent Imaging: Urine reviewed.  Urine sent for culture.  Chart reviewed  Assessment & Plan: Patient has mild urge incontinence.  Reassess with pelvic examination and cystoscopy in 6 weeks on Gemtesa samples and prescription.  Physical therapy offered.  Physical therapy ordered.  Call if culture positive  There are no diagnoses linked to this encounter.  No follow-ups on file.  Martina Sinner, MD  [facility] [address] [place], [place] [address] [phone]

## 2023-03-27 DIAGNOSIS — F331 Major depressive disorder, recurrent, moderate: Secondary | ICD-10-CM | POA: Diagnosis not present

## 2023-03-27 DIAGNOSIS — F411 Generalized anxiety disorder: Secondary | ICD-10-CM | POA: Diagnosis not present

## 2023-03-28 LAB — CULTURE, URINE COMPREHENSIVE

## 2023-04-13 DIAGNOSIS — F411 Generalized anxiety disorder: Secondary | ICD-10-CM | POA: Diagnosis not present

## 2023-04-13 DIAGNOSIS — G4700 Insomnia, unspecified: Secondary | ICD-10-CM | POA: Diagnosis not present

## 2023-04-13 DIAGNOSIS — R41840 Attention and concentration deficit: Secondary | ICD-10-CM | POA: Diagnosis not present

## 2023-04-13 DIAGNOSIS — F331 Major depressive disorder, recurrent, moderate: Secondary | ICD-10-CM | POA: Diagnosis not present

## 2023-04-23 DIAGNOSIS — F331 Major depressive disorder, recurrent, moderate: Secondary | ICD-10-CM | POA: Diagnosis not present

## 2023-04-23 DIAGNOSIS — F411 Generalized anxiety disorder: Secondary | ICD-10-CM | POA: Diagnosis not present

## 2023-05-01 ENCOUNTER — Ambulatory Visit (INDEPENDENT_AMBULATORY_CARE_PROVIDER_SITE_OTHER): Payer: PPO

## 2023-05-01 VITALS — Ht 60.0 in | Wt 140.0 lb

## 2023-05-01 DIAGNOSIS — Z0000 Encounter for general adult medical examination without abnormal findings: Secondary | ICD-10-CM | POA: Diagnosis not present

## 2023-05-01 DIAGNOSIS — Z13820 Encounter for screening for osteoporosis: Secondary | ICD-10-CM

## 2023-05-01 NOTE — Patient Instructions (Signed)
[name] , Thank you for taking time to come for your Medicare Wellness Visit. I appreciate your ongoing commitment to your health goals. Please review the following plan we discussed and let me know if I can assist you in the future.   Referrals/Orders/Follow-Ups/Clinician Recommendations:   You have an order for:  []   2D Mammogram  []   3D Mammogram  [x]   Bone Density     Please call for appointment:  [facility]  [address] [place] [address] [phone]   Make sure to bring picture ID and insurance card.  Bring list of medications you are currently taking including any supplements.   Schedule your [place] screening mammogram through MyChart!   Log into your MyChart account.  Go to 'Visit' (or 'Appointments' if on mobile App) --> Schedule an Appointment  Under 'Select a Reason for Visit' choose the Mammogram Screening option.  Complete the pre-visit questions and select the time and place that best fits your schedule.    This is a list of the screening recommended for you and due dates:  Health Maintenance  Topic Date Due   Complete foot exam   Never done   Zoster (Shingles) Vaccine (1 of 2) 08/11/1970   Flu Shot  01/11/2023   COVID-19 Vaccine (4 - 2023-24 season) 02/11/2023   Yearly kidney health urinalysis for diabetes  03/09/2023   Hemoglobin A1C  04/26/2023   DEXA scan (bone density measurement)  09/01/2023*   Yearly kidney function blood test for diabetes  10/24/2023   Eye exam for diabetics  10/27/2023   Medicare Annual Wellness Visit  04/30/2024   Mammogram  05/08/2024   Colon Cancer Screening  06/22/2027   Pneumonia Vaccine  Completed   Hepatitis C Screening  Completed   HPV Vaccine  Aged Out   DTaP/Tdap/Td vaccine  Discontinued  *Topic was postponed. The date shown is not the original due date.    Advanced directives: (Copy Requested) Please bring a copy of your health care power of  attorney and living will to the office to be added to your chart at your convenience. Pt says she has the paperwork to complete  Next Medicare Annual Wellness Visit scheduled for next year: Yes 05/01/24 @ 1:40pm telephone

## 2023-05-01 NOTE — Progress Notes (Signed)
Subjective:   Katie Buckley is a 72 y.o. female who presents for Medicare Annual (Subsequent) preventive examination.  Visit Complete: Virtual I connected with  Katie Buckley on 05/01/23 by a audio enabled telemedicine application and verified that I am speaking with the correct person using two identifiers.  Patient Location: Home  Provider Location: Office/Clinic  I discussed the limitations of evaluation and management by telemedicine. The patient expressed understanding and agreed to proceed.  Vital Signs: Because this visit was a virtual/telehealth visit, some criteria may be missing or patient reported. Any vitals not documented were not able to be obtained and vitals that have been documented are patient reported.  Patient Medicare AWV questionnaire was completed by the patient on 04/30/23; I have confirmed that all information answered by patient is correct and no changes since this date.  Cardiac Risk Factors include: advanced age (>22men, >61 women);diabetes mellitus;dyslipidemia;hypertension    Objective:    Today's Vitals   04/30/23 1748 05/01/23 1303  Weight:  140 lb (63.5 kg)  Height:  5' (1.524 m)  PainSc: 2     Body mass index is 27.34 kg/m.     05/01/2023    1:13 PM 01/12/2023    1:18 PM 12/18/2022    9:36 AM 10/24/2022    3:17 PM 06/21/2022    9:11 AM 05/28/2022    6:06 PM 04/25/2022   10:55 AM  Advanced Directives  Does Patient Have a Medical Advance Directive? No No No No No No No  Would patient like information on creating a medical advance directive?    No - Patient declined No - Patient declined  No - Patient declined    Current Medications (verified) Outpatient Encounter Medications as of 05/01/2023  Medication Sig   acetaminophen (TYLENOL) 650 MG CR tablet Take 1,300 mg by mouth every 8 (eight) hours as needed for pain (pain).   amLODipine (NORVASC) 5 MG tablet Take 1 tablet (5 mg total) by mouth daily. (Patient taking differently: Take 5  mg by mouth 2 (two) times daily.)   apixaban (ELIQUIS) 5 MG TABS tablet Take 1 tablet (5 mg total) by mouth 2 (two) times daily.   Blood Glucose Monitoring Suppl (CONTOUR NEXT MONITOR) w/Device KIT Use to check blood sugar daily   buPROPion (WELLBUTRIN SR) 150 MG 12 hr tablet Take 150 mg by mouth 2 (two) times daily.   butalbital-acetaminophen-caffeine (FIORICET) 50-325-40 MG tablet TAKE 1 TABLET BY MOUTH EVERY 6 HOURS AS NEEDED FOR HEADACHE.   ezetimibe (ZETIA) 10 MG tablet Take 1 tablet (10 mg total) by mouth at bedtime.   fexofenadine (ALLEGRA) 180 MG tablet Take 180 mg by mouth daily as needed for allergies or rhinitis.   FLUoxetine (PROZAC) 40 MG capsule Take 40 mg by mouth daily.   fluticasone (FLONASE) 50 MCG/ACT nasal spray Place 1 spray into both nostrils daily as needed for allergies or rhinitis.   glipiZIDE (GLUCOTROL) 5 MG tablet Take 1 tablet (5 mg total) by mouth daily.   glucose blood (CONTOUR NEXT TEST) test strip Use to check blood sugar once daily   irbesartan (AVAPRO) 75 MG tablet TAKE 1 TABLET BY MOUTH EVERY DAY   Lancet Devices MISC    metFORMIN (GLUCOPHAGE-XR) 500 MG 24 hr tablet TAKE 2 TABLETS BY MOUTH EVERY DAY   metoprolol tartrate (LOPRESSOR) 50 MG tablet Take 1 tablet (50 mg total) by mouth 2 (two) times daily.   montelukast (SINGULAIR) 10 MG tablet Take 1 tablet (10 mg total) by  mouth at bedtime as needed (allergies). Home med.   ondansetron (ZOFRAN) 4 MG tablet TAKE 1 TABLET BY MOUTH DAILY AS NEEDED FOR NAUSEA OR VOMITING.   pregabalin (LYRICA) 75 MG capsule Take 1 capsule (75 mg total) by mouth 3 (three) times daily.   RABEprazole (ACIPHEX) 20 MG tablet Take 1 tablet (20 mg total) by mouth 2 times daily at 12 noon and 4 pm.   rosuvastatin (CRESTOR) 20 MG tablet TAKE 1 TABLET BY MOUTH EVERY DAY IN THE EVENING   traZODone (DESYREL) 50 MG tablet Take 50-100 mg by mouth at bedtime.   Vibegron (GEMTESA) 75 MG TABS Take 1 tablet (75 mg total) by mouth daily.   No  facility-administered encounter medications on file as of 05/01/2023.    Allergies (verified) Doxycycline and Biaxin [clarithromycin]   History: Past Medical History:  Diagnosis Date   Anemia    Anxiety    Aortic stenosis 01/21/2013   s/p bioprosthetic TAVR 10/2021   Arthritis    Left Hip   Coronary artery disease    CABG 2023 x1   Diabetes mellitus without complication (HCC)    Diabetic neuropathy (HCC)    Diabetic retinopathy (HCC)    Frequent sinus infections    GERD (gastroesophageal reflux disease)    Headache    Tension headaches   Heart murmur    AVR 2023   History of Barrett's esophagus    History of colon polyps    Hyperchloremia    Hypertension    MRSA (methicillin resistant staph aureus) culture positive 2018   history of, in hand   Palpitations    Pneumonia    Hx   Seasonal allergies    Skin cancer    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   Past Surgical History:  Procedure Laterality Date   ANTERIOR CERVICAL DECOMP/DISCECTOMY FUSION N/A 11/15/2020   Procedure: ANTERIOR CERVICAL DECOMPRESSION/DISCECTOMY FUSION 1 LEVEL C3/4;  Surgeon: Lucy Chris, MD;  Location: [facility] ORS;  Service: Neurosurgery;  Laterality: N/A;   AORTIC VALVE REPLACEMENT N/A 09/19/2021   Procedure: AORTIC VALVE REPLACEMENT Using 19mm Edwards Resilia Inspiris Valve;  Surgeon: Alleen Borne, MD;  Location: [facility] OR;  Service: Open Heart Surgery;  Laterality: N/A;   AUGMENTATION MAMMAPLASTY Bilateral    breast implants 1980s   BIOPSY  12/18/2022   Procedure: BIOPSY;  Surgeon: Wyline Mood, MD;  Location: [facility] ENDOSCOPY;  Service: Gastroenterology;;   Bone Spur  2007   foot   BREAST IMPLANT REMOVAL Bilateral 10/30/2022   Procedure: Bilateral removal of breast implants with mastopexy;  Surgeon: Peggye Form, DO;  Location: [facility] OR;  Service: Plastics;  Laterality: Bilateral;   CARDIAC CATHETERIZATION     Carpal Tunnel Symdrome  2008   as repeated in 2009   CATARACT EXTRACTION  W/PHACO Left 01/16/2019   Procedure: CATARACT EXTRACTION PHACO AND INTRAOCULAR LENS PLACEMENT (IOC) LEFT DIABETES VISION BLUE;  Surgeon: Elliot Cousin, MD;  Location: [facility] ORS;  Service: Ophthalmology;  Laterality: Left;  [place]  00:47 CDE 6.62 Fluid pack lot # [id number]   CATARACT EXTRACTION W/PHACO Right 02/06/2019   Procedure: CATARACT EXTRACTION PHACO AND INTRAOCULAR LENS PLACEMENT (IOC);  Surgeon: Elliot Cousin, MD;  Location: [facility] ORS;  Service: Ophthalmology;  Laterality: Right;  [place] 00:54.6 CDE 6.52 FLUID PACK LOT # [id number]   CHOLECYSTECTOMY  2007   COLONOSCOPY WITH PROPOFOL N/A 11/17/2015   Procedure: COLONOSCOPY WITH PROPOFOL;  Surgeon: Scot Jun, MD;  Location: [facility] ENDOSCOPY;  Service: Endoscopy;  Laterality: N/A;   COLONOSCOPY WITH PROPOFOL N/A 06/21/2022   Procedure: COLONOSCOPY WITH PROPOFOL;  Surgeon: Wyline Mood, MD;  Location: [facility] ENDOSCOPY;  Service: Gastroenterology;  Laterality: N/A;   CORONARY ARTERY BYPASS GRAFT N/A 09/19/2021   Procedure: CORONARY ARTERY BYPASS GRAFTING X1 Using Left Internal Mammory artery;  Surgeon: Alleen Borne, MD;  Location: [facility] OR;  Service: Open Heart Surgery;  Laterality: N/A;   CORONARY PRESSURE/FFR STUDY N/A 05/24/2021   Procedure: INTRAVASCULAR PRESSURE WIRE/FFR STUDY;  Surgeon: Alwyn Pea, MD;  Location: [facility] INVASIVE CV LAB;  Service: Cardiovascular;  Laterality: N/A;   ESOPHAGOGASTRODUODENOSCOPY (EGD) WITH PROPOFOL N/A 11/17/2015   Procedure: ESOPHAGOGASTRODUODENOSCOPY (EGD) WITH PROPOFOL;  Surgeon: Scot Jun, MD;  Location: [facility] ENDOSCOPY;  Service: Endoscopy;  Laterality: N/A;   ESOPHAGOGASTRODUODENOSCOPY (EGD) WITH PROPOFOL N/A 12/18/2022   Procedure: ESOPHAGOGASTRODUODENOSCOPY (EGD) WITH PROPOFOL;  Surgeon: Wyline Mood, MD;  Location: [facility] ENDOSCOPY;  Service: Gastroenterology;  Laterality: N/A;   MANDIBLE SURGERY     MASTOPEXY Bilateral 10/30/2022   Procedure: MASTOPEXY;  Surgeon: Peggye Form, DO;  Location:  [facility] OR;  Service: Plastics;  Laterality: Bilateral;   NASAL SINUS SURGERY  03/2013   Multile surgery Dr. Chestine Spore. 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 Katrinka Blazing   RIGHT/LEFT HEART CATH AND CORONARY ANGIOGRAPHY N/A 05/24/2021   Procedure: RIGHT/LEFT HEART CATH AND CORONARY ANGIOGRAPHY;  Surgeon: Alwyn Pea, MD;  Location: [facility] INVASIVE CV LAB;  Service: Cardiovascular;  Laterality: N/A;   TEE WITHOUT CARDIOVERSION N/A 09/19/2021   Procedure: TRANSESOPHAGEAL ECHOCARDIOGRAM (TEE);  Surgeon: Alleen Borne, MD;  Location: [facility] OR;  Service: Open Heart Surgery;  Laterality: N/A;   TONSILLECTOMY  1960   Family History  Problem Relation Age of Onset   Lung cancer Mother    Diabetes Father    Irritable bowel syndrome Sister    Throat cancer Brother    Breast cancer Daughter    Breast cancer Maternal Aunt    Social History   Socioeconomic History   Marital status: Married    Spouse name: Fayrene Fearing, husband   Number of children: 1   Years of education: Not on file   Highest education level: Not on file  Occupational History    Comment: retired    Associate Professor: RETIRED  Tobacco Use   Smoking status: Never    Passive exposure: Never   Smokeless tobacco: Never  Vaping Use   Vaping status: Never Used  Substance and Sexual Activity   Alcohol use: No   Drug use: No   Sexual activity: Not Currently    Partners: Male    Birth control/protection: Post-menopausal  Other Topics Concern   Not on file  Social History Narrative   Not on file   Social Determinants of Health   Financial Resource Strain: Low Risk  (04/30/2023)   Overall Financial Resource Strain (CARDIA)    Difficulty of Paying Living Expenses: Not hard at all  Food Insecurity: No Food Insecurity (04/30/2023)   Hunger Vital Sign    Worried About Running Out of Food in the Last Year: Never true    Ran Out of Food in the Last Year: Never true  Transportation Needs: No Transportation  Needs (04/30/2023)   PRAPARE - Administrator, Civil Service (Medical): No    Lack of Transportation (Non-Medical): No  Physical Activity: Insufficiently Active (04/30/2023)   Exercise Vital Sign    Days of Exercise per Week: 2 days  Minutes of Exercise per Session: 30 min  Stress: No Stress Concern Present (04/30/2023)   [employer] of Occupational Health - Occupational Stress Questionnaire    Feeling of Stress : Not at all  Social Connections: Unknown (04/30/2023)   Social Connection and Isolation Panel [NHANES]    Frequency of Communication with Friends and Family: More than three times a week    Frequency of Social Gatherings with Friends and Family: Twice a week    Attends Religious Services: Not on Marketing executive or Organizations: No    Attends Engineer, structural: Never    Marital Status: Married    Tobacco Counseling Counseling given: Not Answered   Clinical Intake:  Pre-visit preparation completed: Yes  Pain : 0-10 Pain Score: 2  Pain Type: Acute pain Pain Location: Generalized Pain Descriptors / Indicators: Aching Pain Onset: 1 to 4 weeks ago Pain Frequency: Intermittent Pain Relieving Factors: tylenol  Pain Relieving Factors: tylenol  BMI - recorded: 27.34 Nutritional Status: BMI 25 -29 Overweight Nutritional Risks: None Diabetes: Yes CBG done?: No Did pt. bring in CBG monitor from home?: No  How often do you need to have someone help you when you read instructions, pamphlets, or other written materials from your doctor or pharmacy?: 1 - Never  Interpreter Needed?: No  Comments: lives with husband Information entered by :: B.Hansel Devan,LPN   Activities of Daily Living    04/30/2023    5:48 PM 02/13/2023    4:52 PM  In your present state of health, do you have any difficulty performing the following activities:  Hearing? 0 0  Vision? 0 0  Difficulty concentrating or making decisions? 0 0  Walking or  climbing stairs? 0 0  Dressing or bathing? 0 0  Doing errands, shopping? 0 0  Preparing Food and eating ? N N  Using the Toilet? N N  In the past six months, have you accidently leaked urine? N Y  Do you have problems with loss of bowel control? N N  Managing your Medications? N N  Managing your Finances? N N  Housekeeping or managing your Housekeeping? N N    Patient Care Team: Mort Sawyers, FNP as PCP - General (Family Medicine) Darliss Ridgel, MD as Referring Physician (Psychiatry) Lonell Face, MD as Consulting Physician (Neurology) Gaspar Cola, [facility] (Inactive) (Pharmacist) Alwyn Pea, MD as Consulting Physician (Cardiology) Michaelyn Barter, MD as Consulting Physician (Oncology)  Indicate any recent Medical Services you may have received from other than [facility] providers in the past year (date may be approximate).     Assessment:   This is a routine wellness examination for Katie.  Hearing/Vision screen Hearing Screening - Comments:: Pt says she lost hearing in one ear;pt feels hears good enough Vision Screening - Comments:: Pt says she wears readers and vision good Dr Rolley Sims [facility]   Goals Addressed             This Visit's Progress    COMPLETED: DIET - EAT MORE FRUITS AND VEGETABLES   On track    Patient Stated       Pt wants to change her diet and lose weight; cut out sugar.       Depression Screen    05/01/2023    1:09 PM 08/16/2022    9:35 AM 01/31/2022    3:33 PM 10/20/2021   11:18 AM 02/09/2021    9:36 AM 10/27/2020    9:52 AM 09/01/2020  8:35 AM  PHQ 2/9 Scores  PHQ - 2 Score 0 3 1 0 2 2 2   PHQ- 9 Score  5 3 2 2 7 9     Fall Risk    04/30/2023    5:48 PM 02/13/2023    4:52 PM 09/01/2022    9:11 AM 08/16/2022    9:35 AM 03/29/2022    3:43 PM  Fall Risk   Falls in the past year? 1 1 1 1 1   Number falls in past yr: 0 0 1 1 1   Comment     last fall in the last month.  feeling of unbalance  Injury with Fall? 0 0 1 1 1    Comment     fractured bones in her foot, another time stress fracture in her knee.  Risk for fall due to : No Fall Risks  History of fall(s) History of fall(s)   Follow up Education provided;Falls prevention discussed  Falls evaluation completed;Education provided;Falls prevention discussed Falls evaluation completed;Education provided;Falls prevention discussed Education provided;Falls prevention discussed    MEDICARE RISK AT HOME: Medicare Risk at Home Any stairs in or around the home?: Yes If so, are there any without handrails?: Yes Home free of loose throw rugs in walkways, pet beds, electrical cords, etc?: Yes Adequate lighting in your home to reduce risk of falls?: Yes Life alert?: No Use of a cane, walker or w/c?: No Grab bars in the bathroom?: Yes Shower chair or bench in shower?: Yes Elevated toilet seat or a handicapped toilet?: No  TIMED UP AND GO:  Was the test performed?  No    Cognitive Function:    09/01/2020    8:49 AM  MMSE - Mini Mental State Exam  Orientation to time 5  Orientation to Place 5  Registration 3  Attention/ Calculation 5  Recall 3  Language- name 2 objects 2  Language- repeat 1  Language- follow 3 step command 3  Language- read & follow direction 1  Write a sentence 1  Copy design 1  Total score 30        05/01/2023    1:15 PM  6CIT Screen  What Year? 0 points  What month? 0 points  What time? 0 points  Count back from 20 0 points  Months in reverse 0 points  Repeat phrase 0 points  Total Score 0 points    Immunizations Immunization History  Administered Date(s) Administered   Fluad Quad(high Dose 65+) 02/09/2021, 03/08/2022   Hep A / Hep B 02/15/2011, 03/29/2011, 09/06/2011   Influenza, High Dose Seasonal PF 04/18/2017   Influenza,inj,Quad PF,6+ Mos 04/23/2015, 02/17/2016   Influenza-Unspecified 03/25/2019, 03/28/2023   Moderna Covid-19 Fall Seasonal Vaccine 46yrs & older 07/19/2022   PFIZER(Purple Top)SARS-COV-2  Vaccination 08/03/2019, 08/27/2019   Pneumococcal Conjugate-13 04/18/2017   Pneumococcal Polysaccharide-23 11/27/2011, 03/29/2018   Tdap 10/09/2007   Zoster, Live 11/27/2011    TDAP status: Up to date  Flu Vaccine status: Up to date  Pneumococcal vaccine status: Up to date  Covid-19 vaccine status: Completed vaccines  Qualifies for Shingles Vaccine? Yes   Zostavax completed No   Shingrix Completed?: No.    Education has been provided regarding the importance of this vaccine. Patient has been advised to call insurance company to determine out of pocket expense if they have not yet received this vaccine. Advised may also receive vaccine at local pharmacy or Health Dept. Verbalized acceptance and understanding.  Screening Tests Health Maintenance  Topic Date Due   Zoster  Vaccines- Shingrix (1 of 2) 08/11/1970   Diabetic kidney evaluation - Urine ACR  05/02/2023 (Originally 03/09/2023)   COVID-19 Vaccine (4 - 2023-24 season) 05/17/2023 (Originally 02/11/2023)   HEMOGLOBIN A1C  05/31/2023 (Originally 04/26/2023)   FOOT EXAM  07/13/2023 (Originally 08/10/1960)   DEXA SCAN  09/01/2023 (Originally 08/10/2016)   Diabetic kidney evaluation - eGFR measurement  10/24/2023   OPHTHALMOLOGY EXAM  10/27/2023   Medicare Annual Wellness (AWV)  04/30/2024   MAMMOGRAM  05/08/2024   Colonoscopy  06/22/2027   Pneumonia Vaccine 70+ Years old  Completed   INFLUENZA VACCINE  Completed   Hepatitis C Screening  Completed   HPV VACCINES  Aged Out   DTaP/Tdap/Td  Discontinued    Health Maintenance  Health Maintenance Due  Topic Date Due   Zoster Vaccines- Shingrix (1 of 2) 08/11/1970    Colorectal cancer screening: Type of screening: Colonoscopy. Completed 06/21/22. Repeat every 5 years  Mammogram status: Completed 05/08/2022. Repeat every year  Bone Density status: Ordered yes. Pt provided with contact info and advised to call to schedule appt.  Lung Cancer Screening: (Low Dose CT Chest recommended  if Age 73-80 years, 20 pack-year currently smoking OR have quit w/in 15years.) does not qualify.   Lung Cancer Screening Referral: no  Additional Screening:  Hepatitis C Screening: does not qualify; Completed 06/17/2019  Vision Screening: Recommended annual ophthalmology exams for early detection of glaucoma and other disorders of the eye. Is the patient up to date with their annual eye exam?  Yes  Who is the provider or what is the name of the office in which the patient attends annual eye exams? Dr Rolley Sims If pt is not established with a provider, would they like to be referred to a provider to establish care? No .   Dental Screening: Recommended annual dental exams for proper oral hygiene  Diabetic Foot Exam: Diabetic Foot Exam: Overdue, Pt has been advised about the importance in completing this exam. Pt is scheduled for diabetic foot exam on next PCP visit.  Community Resource Referral / Chronic Care Management: CRR required this visit?  No   CCM required this visit?  No     Plan:     I have personally reviewed and noted the following in the patient's chart:   Medical and social history Use of alcohol, tobacco or illicit drugs  Current medications and supplements including opioid prescriptions. Patient is not currently taking opioid prescriptions. Functional ability and status Nutritional status Physical activity Advanced directives List of other physicians Hospitalizations, surgeries, and ER visits in previous 12 months Vitals Screenings to include cognitive, depression, and falls Referrals and appointments  In addition, I have reviewed and discussed with patient certain preventive protocols, quality metrics, and best practice recommendations. A written personalized care plan for preventive services as well as general preventive health recommendations were provided to patient.    Sue Lush, LPN   09/60/4540   After Visit Summary: (MyChart) Due to this being  a telephonic visit, the after visit summary with patients personalized plan was offered to patient via MyChart   Nurse Notes: The patient states she is doing well and has no concerns or questions at this time.Pt to schedule appt with PCP for PE at her convenience.

## 2023-05-14 ENCOUNTER — Other Ambulatory Visit: Payer: PPO | Admitting: Urology

## 2023-05-16 ENCOUNTER — Encounter: Payer: Self-pay | Admitting: Urology

## 2023-05-23 DIAGNOSIS — R41840 Attention and concentration deficit: Secondary | ICD-10-CM | POA: Diagnosis not present

## 2023-05-23 DIAGNOSIS — F331 Major depressive disorder, recurrent, moderate: Secondary | ICD-10-CM | POA: Diagnosis not present

## 2023-05-23 DIAGNOSIS — F411 Generalized anxiety disorder: Secondary | ICD-10-CM | POA: Diagnosis not present

## 2023-05-23 DIAGNOSIS — G4700 Insomnia, unspecified: Secondary | ICD-10-CM | POA: Diagnosis not present

## 2023-06-20 ENCOUNTER — Other Ambulatory Visit: Payer: Self-pay | Admitting: Internal Medicine

## 2023-06-20 ENCOUNTER — Other Ambulatory Visit: Payer: Self-pay | Admitting: Family

## 2023-06-20 DIAGNOSIS — E782 Mixed hyperlipidemia: Secondary | ICD-10-CM

## 2023-06-21 ENCOUNTER — Encounter: Payer: Self-pay | Admitting: Internal Medicine

## 2023-06-28 DIAGNOSIS — G245 Blepharospasm: Secondary | ICD-10-CM | POA: Diagnosis not present

## 2023-06-28 DIAGNOSIS — F411 Generalized anxiety disorder: Secondary | ICD-10-CM | POA: Diagnosis not present

## 2023-06-28 DIAGNOSIS — F331 Major depressive disorder, recurrent, moderate: Secondary | ICD-10-CM | POA: Diagnosis not present

## 2023-07-06 ENCOUNTER — Other Ambulatory Visit: Payer: Self-pay | Admitting: Internal Medicine

## 2023-07-16 ENCOUNTER — Other Ambulatory Visit: Payer: Self-pay

## 2023-07-16 ENCOUNTER — Inpatient Hospital Stay: Payer: PPO | Attending: Internal Medicine

## 2023-07-16 ENCOUNTER — Inpatient Hospital Stay: Payer: PPO | Admitting: Internal Medicine

## 2023-07-16 VITALS — BP 154/52 | Temp 98.2°F | Resp 20 | Ht 60.0 in | Wt 143.5 lb

## 2023-07-16 DIAGNOSIS — Z85828 Personal history of other malignant neoplasm of skin: Secondary | ICD-10-CM | POA: Insufficient documentation

## 2023-07-16 DIAGNOSIS — I10 Essential (primary) hypertension: Secondary | ICD-10-CM | POA: Insufficient documentation

## 2023-07-16 DIAGNOSIS — K219 Gastro-esophageal reflux disease without esophagitis: Secondary | ICD-10-CM | POA: Insufficient documentation

## 2023-07-16 DIAGNOSIS — D509 Iron deficiency anemia, unspecified: Secondary | ICD-10-CM | POA: Insufficient documentation

## 2023-07-16 DIAGNOSIS — Z801 Family history of malignant neoplasm of trachea, bronchus and lung: Secondary | ICD-10-CM | POA: Diagnosis not present

## 2023-07-16 DIAGNOSIS — I25118 Atherosclerotic heart disease of native coronary artery with other forms of angina pectoris: Secondary | ICD-10-CM | POA: Diagnosis not present

## 2023-07-16 DIAGNOSIS — I480 Paroxysmal atrial fibrillation: Secondary | ICD-10-CM | POA: Diagnosis not present

## 2023-07-16 DIAGNOSIS — Z8719 Personal history of other diseases of the digestive system: Secondary | ICD-10-CM | POA: Insufficient documentation

## 2023-07-16 DIAGNOSIS — Z860100 Personal history of colon polyps, unspecified: Secondary | ICD-10-CM | POA: Insufficient documentation

## 2023-07-16 DIAGNOSIS — K2270 Barrett's esophagus without dysplasia: Secondary | ICD-10-CM | POA: Diagnosis not present

## 2023-07-16 DIAGNOSIS — R2689 Other abnormalities of gait and mobility: Secondary | ICD-10-CM | POA: Insufficient documentation

## 2023-07-16 DIAGNOSIS — E1165 Type 2 diabetes mellitus with hyperglycemia: Secondary | ICD-10-CM | POA: Diagnosis not present

## 2023-07-16 DIAGNOSIS — D696 Thrombocytopenia, unspecified: Secondary | ICD-10-CM | POA: Diagnosis not present

## 2023-07-16 DIAGNOSIS — Z79899 Other long term (current) drug therapy: Secondary | ICD-10-CM | POA: Insufficient documentation

## 2023-07-16 DIAGNOSIS — E538 Deficiency of other specified B group vitamins: Secondary | ICD-10-CM | POA: Diagnosis not present

## 2023-07-16 DIAGNOSIS — R5383 Other fatigue: Secondary | ICD-10-CM | POA: Diagnosis not present

## 2023-07-16 DIAGNOSIS — E1140 Type 2 diabetes mellitus with diabetic neuropathy, unspecified: Secondary | ICD-10-CM | POA: Insufficient documentation

## 2023-07-16 DIAGNOSIS — Z951 Presence of aortocoronary bypass graft: Secondary | ICD-10-CM | POA: Diagnosis not present

## 2023-07-16 DIAGNOSIS — I2510 Atherosclerotic heart disease of native coronary artery without angina pectoris: Secondary | ICD-10-CM | POA: Diagnosis not present

## 2023-07-16 DIAGNOSIS — Z803 Family history of malignant neoplasm of breast: Secondary | ICD-10-CM | POA: Insufficient documentation

## 2023-07-16 DIAGNOSIS — R270 Ataxia, unspecified: Secondary | ICD-10-CM | POA: Diagnosis not present

## 2023-07-16 DIAGNOSIS — Z9049 Acquired absence of other specified parts of digestive tract: Secondary | ICD-10-CM | POA: Diagnosis not present

## 2023-07-16 DIAGNOSIS — J329 Chronic sinusitis, unspecified: Secondary | ICD-10-CM | POA: Diagnosis not present

## 2023-07-16 DIAGNOSIS — Z7984 Long term (current) use of oral hypoglycemic drugs: Secondary | ICD-10-CM | POA: Insufficient documentation

## 2023-07-16 DIAGNOSIS — Z953 Presence of xenogenic heart valve: Secondary | ICD-10-CM | POA: Diagnosis not present

## 2023-07-16 DIAGNOSIS — I350 Nonrheumatic aortic (valve) stenosis: Secondary | ICD-10-CM | POA: Diagnosis not present

## 2023-07-16 DIAGNOSIS — Z8673 Personal history of transient ischemic attack (TIA), and cerebral infarction without residual deficits: Secondary | ICD-10-CM | POA: Diagnosis not present

## 2023-07-16 DIAGNOSIS — Z7901 Long term (current) use of anticoagulants: Secondary | ICD-10-CM | POA: Diagnosis not present

## 2023-07-16 DIAGNOSIS — D508 Other iron deficiency anemias: Secondary | ICD-10-CM

## 2023-07-16 DIAGNOSIS — E782 Mixed hyperlipidemia: Secondary | ICD-10-CM | POA: Diagnosis not present

## 2023-07-16 LAB — CBC WITH DIFFERENTIAL/PLATELET
Abs Immature Granulocytes: 0.04 10*3/uL (ref 0.00–0.07)
Basophils Absolute: 0 10*3/uL (ref 0.0–0.1)
Basophils Relative: 0 %
Eosinophils Absolute: 0.1 10*3/uL (ref 0.0–0.5)
Eosinophils Relative: 2 %
HCT: 35.8 % — ABNORMAL LOW (ref 36.0–46.0)
Hemoglobin: 11.8 g/dL — ABNORMAL LOW (ref 12.0–15.0)
Immature Granulocytes: 1 %
Lymphocytes Relative: 25 %
Lymphs Abs: 1.8 10*3/uL (ref 0.7–4.0)
MCH: 27.5 pg (ref 26.0–34.0)
MCHC: 33 g/dL (ref 30.0–36.0)
MCV: 83.4 fL (ref 80.0–100.0)
Monocytes Absolute: 0.6 10*3/uL (ref 0.1–1.0)
Monocytes Relative: 8 %
Neutro Abs: 4.7 10*3/uL (ref 1.7–7.7)
Neutrophils Relative %: 64 %
Platelets: 146 10*3/uL — ABNORMAL LOW (ref 150–400)
RBC: 4.29 MIL/uL (ref 3.87–5.11)
RDW: 13.5 % (ref 11.5–15.5)
WBC: 7.3 10*3/uL (ref 4.0–10.5)
nRBC: 0 % (ref 0.0–0.2)

## 2023-07-16 LAB — IRON AND TIBC
Iron: 69 ug/dL (ref 28–170)
Saturation Ratios: 18 % (ref 10.4–31.8)
TIBC: 377 ug/dL (ref 250–450)
UIBC: 308 ug/dL

## 2023-07-16 LAB — FERRITIN: Ferritin: 49 ng/mL (ref 11–307)

## 2023-07-16 NOTE — Progress Notes (Signed)
[facility]  [phone] [phone]) [phone]  ID: Olene Floss OB: [birth year]  MR#: [id number]  AOZ#:[id number]  Patient Care Team: Mort Sawyers, FNP as PCP - General (Family Medicine) Darliss Ridgel, MD as Referring Physician (Psychiatry) Lonell Face, MD as Consulting Physician (Neurology) Gaspar Cola, [facility] (Inactive) (Pharmacist) Alwyn Pea, MD as Consulting Physician (Cardiology) Michaelyn Barter, MD as Consulting Physician (Oncology) Estanislado Pandy, MD as Consulting Physician (Ophthalmology)   HPI: Katie Buckley is a 73 y.o. female with past medical history of aortic stenosis status post bioprosthetic valve in May 2023, Barrett's esophagus, anxiety, and skin cancer was referred to hematology for further work-up of worsening anemia.  Patient was treated with Feraheme x 2 doses with good response to iron.  Endoscopy done in June 2017 by Dr. Mechele Collin showed esophageal mucosal changes secondary to established short segment Barrett's esophagus, erythema and mild inflammation in the gastric antrum.  H. pylori was negative.   Colonoscopy with Dr. Tobi Bastos on 06/21/2022 which showed 5 mm polyp in the rectum otherwise was unremarkable.   12/18/2022: EGD: Possible short segment Barrett's esophagus in the GE junction 1 cm in length large amount of food was seen in the stomach no obstruction.  Biopsies of the salmon-colored mucosa showed chronic gastritis no evidence of intestinal metaplasia.    INTERVAL HISTORY-  Patient was seen today as follow-up for iron deficiency anemia and thrombocytopenia. Denies any bleeding in urine or stools.  Continues to have chronic nausea.  Uses Zofran once or twice a week.  She was recommended gastroparesis diet by GI which she has not been able to maintain it.  Sleeps well at night but midafternoon starts feeling fatigue and sleepy.  Denies any snoring.  REVIEW OF SYSTEMS:   Review of Systems   Constitutional:  Positive for malaise/fatigue. Negative for chills and fever.  Respiratory:  Negative for cough, hemoptysis and shortness of breath.   Gastrointestinal:  Negative for abdominal pain, blood in stool, constipation, nausea and vomiting.  Musculoskeletal: Negative.   Neurological:  Negative for dizziness.    As per HPI. Otherwise, a complete review of systems is negative.  PAST MEDICAL HISTORY: Past Medical History:  Diagnosis Date   Anemia    Anxiety    Aortic stenosis 01/21/2013   s/p bioprosthetic TAVR 10/2021   Arthritis    Left Hip   Coronary artery disease    CABG 2023 x1   Diabetes mellitus without complication (HCC)    Diabetic neuropathy (HCC)    Diabetic retinopathy (HCC)    Frequent sinus infections    GERD (gastroesophageal reflux disease)    Headache    Tension headaches   Heart murmur    AVR 2023   History of Barrett's esophagus    History of colon polyps    Hyperchloremia    Hypertension    MRSA (methicillin resistant staph aureus) culture positive 2018   history of, in hand   Palpitations    Pneumonia    Hx   Seasonal allergies    Skin cancer    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    PAST SURGICAL HISTORY: Past Surgical History:  Procedure Laterality Date   ANTERIOR CERVICAL DECOMP/DISCECTOMY FUSION N/A 11/15/2020   Procedure: ANTERIOR CERVICAL DECOMPRESSION/DISCECTOMY FUSION 1 LEVEL C3/4;  Surgeon: Lucy Chris, MD;  Location: [facility] ORS;  Service: Neurosurgery;  Laterality: N/A;   AORTIC VALVE REPLACEMENT N/A 09/19/2021   Procedure: AORTIC VALVE REPLACEMENT Using 19mm Edwards  Resilia Inspiris Valve;  Surgeon: Alleen Borne, MD;  Location: [facility] OR;  Service: Open Heart Surgery;  Laterality: N/A;   AUGMENTATION MAMMAPLASTY Bilateral    breast implants 1980s   BIOPSY  12/18/2022   Procedure: BIOPSY;  Surgeon: Wyline Mood, MD;  Location: [facility] ENDOSCOPY;  Service: Gastroenterology;;   Bone Spur  2007   foot   BREAST IMPLANT REMOVAL  Bilateral 10/30/2022   Procedure: Bilateral removal of breast implants with mastopexy;  Surgeon: Peggye Form, DO;  Location: [facility] OR;  Service: Plastics;  Laterality: Bilateral;   CARDIAC CATHETERIZATION     Carpal Tunnel Symdrome  2008   as repeated in 2009   CATARACT EXTRACTION W/PHACO Left 01/16/2019   Procedure: CATARACT EXTRACTION PHACO AND INTRAOCULAR LENS PLACEMENT (IOC) LEFT DIABETES VISION BLUE;  Surgeon: Elliot Cousin, MD;  Location: [facility] ORS;  Service: Ophthalmology;  Laterality: Left;  [place]  00:47 CDE 6.62 Fluid pack lot # [id number]   CATARACT EXTRACTION W/PHACO Right 02/06/2019   Procedure: CATARACT EXTRACTION PHACO AND INTRAOCULAR LENS PLACEMENT (IOC);  Surgeon: Elliot Cousin, MD;  Location: [facility] ORS;  Service: Ophthalmology;  Laterality: Right;  [place] 00:54.6 CDE 6.52 FLUID PACK LOT # [id number]   CHOLECYSTECTOMY  2007   COLONOSCOPY WITH PROPOFOL N/A 11/17/2015   Procedure: COLONOSCOPY WITH PROPOFOL;  Surgeon: Scot Jun, MD;  Location: [facility] ENDOSCOPY;  Service: Endoscopy;  Laterality: N/A;   COLONOSCOPY WITH PROPOFOL N/A 06/21/2022   Procedure: COLONOSCOPY WITH PROPOFOL;  Surgeon: Wyline Mood, MD;  Location: [facility] ENDOSCOPY;  Service: Gastroenterology;  Laterality: N/A;   CORONARY ARTERY BYPASS GRAFT N/A 09/19/2021   Procedure: CORONARY ARTERY BYPASS GRAFTING X1 Using Left Internal Mammory artery;  Surgeon: Alleen Borne, MD;  Location: [facility] OR;  Service: Open Heart Surgery;  Laterality: N/A;   CORONARY PRESSURE/FFR STUDY N/A 05/24/2021   Procedure: INTRAVASCULAR PRESSURE WIRE/FFR STUDY;  Surgeon: Alwyn Pea, MD;  Location: [facility] INVASIVE CV LAB;  Service: Cardiovascular;  Laterality: N/A;   ESOPHAGOGASTRODUODENOSCOPY (EGD) WITH PROPOFOL N/A 11/17/2015   Procedure: ESOPHAGOGASTRODUODENOSCOPY (EGD) WITH PROPOFOL;  Surgeon: Scot Jun, MD;  Location: [facility] ENDOSCOPY;  Service: Endoscopy;  Laterality: N/A;   ESOPHAGOGASTRODUODENOSCOPY (EGD) WITH PROPOFOL N/A  12/18/2022   Procedure: ESOPHAGOGASTRODUODENOSCOPY (EGD) WITH PROPOFOL;  Surgeon: Wyline Mood, MD;  Location: [facility] ENDOSCOPY;  Service: Gastroenterology;  Laterality: N/A;   MANDIBLE SURGERY     MASTOPEXY Bilateral 10/30/2022   Procedure: MASTOPEXY;  Surgeon: Peggye Form, DO;  Location: [facility] OR;  Service: Plastics;  Laterality: Bilateral;   NASAL SINUS SURGERY  03/2013   Multile surgery Dr. Chestine Spore. 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 Katrinka Blazing   RIGHT/LEFT HEART CATH AND CORONARY ANGIOGRAPHY N/A 05/24/2021   Procedure: RIGHT/LEFT HEART CATH AND CORONARY ANGIOGRAPHY;  Surgeon: Alwyn Pea, MD;  Location: [facility] INVASIVE CV LAB;  Service: Cardiovascular;  Laterality: N/A;   TEE WITHOUT CARDIOVERSION N/A 09/19/2021   Procedure: TRANSESOPHAGEAL ECHOCARDIOGRAM (TEE);  Surgeon: Alleen Borne, MD;  Location: [facility] OR;  Service: Open Heart Surgery;  Laterality: N/A;   TONSILLECTOMY  1960    FAMILY HISTORY: Family History  Problem Relation Age of Onset   Lung cancer Mother    Diabetes Father    Irritable bowel syndrome Sister    Throat cancer Brother    Breast cancer Daughter    Breast cancer Maternal Aunt     HEALTH MAINTENANCE: Social History   Tobacco Use   Smoking status: Never  Passive exposure: Never   Smokeless tobacco: Never  Vaping Use   Vaping status: Never Used  Substance Use Topics   Alcohol use: No   Drug use: No     Allergies  Allergen Reactions   Doxycycline Other (See Comments)    Emotional changes/anxious   Biaxin [Clarithromycin]     GI upset, and bad taste in mouth.    Current Outpatient Medications  Medication Sig Dispense Refill   amLODipine (NORVASC) 5 MG tablet Take 1 tablet (5 mg total) by mouth daily. (Patient taking differently: Take 5 mg by mouth 2 (two) times daily.) 30 tablet 1   apixaban (ELIQUIS) 5 MG TABS tablet Take 1 tablet (5 mg total) by mouth 2 (two) times daily. 60 tablet 2   Blood  Glucose Monitoring Suppl (CONTOUR NEXT MONITOR) w/Device KIT Use to check blood sugar daily 1 kit 0   buPROPion (WELLBUTRIN SR) 150 MG 12 hr tablet Take 150 mg by mouth 2 (two) times daily.     butalbital-acetaminophen-caffeine (FIORICET) 50-325-40 MG tablet TAKE 1 TABLET BY MOUTH EVERY 6 HOURS AS NEEDED FOR HEADACHE. 30 tablet 4   ezetimibe (ZETIA) 10 MG tablet Take 1 tablet (10 mg total) by mouth at bedtime. 90 tablet 3   FLUoxetine (PROZAC) 40 MG capsule Take 40 mg by mouth daily.     fluticasone (FLONASE) 50 MCG/ACT nasal spray Place 1 spray into both nostrils daily as needed for allergies or rhinitis.     glipiZIDE (GLUCOTROL) 5 MG tablet Take 1 tablet (5 mg total) by mouth daily. 90 tablet 3   glucose blood (CONTOUR NEXT TEST) test strip Use to check blood sugar once daily 100 each 3   irbesartan (AVAPRO) 75 MG tablet TAKE 1 TABLET BY MOUTH EVERY DAY (Patient taking differently: Take 75 mg by mouth daily.) 90 tablet 4   Lancet Devices MISC      metFORMIN (GLUCOPHAGE-XR) 500 MG 24 hr tablet TAKE 2 TABLETS BY MOUTH EVERY DAY 180 tablet 3   metoprolol tartrate (LOPRESSOR) 50 MG tablet Take 1 tablet (50 mg total) by mouth 2 (two) times daily. 60 tablet 2   ondansetron (ZOFRAN) 4 MG tablet TAKE 1 TABLET BY MOUTH DAILY AS NEEDED FOR NAUSEA OR VOMITING. 90 tablet 0   pregabalin (LYRICA) 75 MG capsule Take 1 capsule (75 mg total) by mouth 3 (three) times daily. (Patient taking differently: Take 75 mg by mouth 2 (two) times daily.)     RABEprazole (ACIPHEX) 20 MG tablet Take 1 tablet (20 mg total) by mouth 2 times daily at 12 noon and 4 pm. 180 tablet 3   rosuvastatin (CRESTOR) 20 MG tablet TAKE 1 TABLET BY MOUTH EVERY DAY IN THE EVENING 90 tablet 4   traZODone (DESYREL) 50 MG tablet Take 50-100 mg by mouth at bedtime.     Vibegron (GEMTESA) 75 MG TABS Take 1 tablet (75 mg total) by mouth daily.     acetaminophen (TYLENOL) 650 MG CR tablet Take 1,300 mg by mouth every 8 (eight) hours as needed for  pain (pain). (Patient not taking: Reported on 07/16/2023)     No current facility-administered medications for this visit.    OBJECTIVE: Vitals:   07/16/23 1330 07/16/23 1343  BP: (!) 164/60 (!) 154/52  Resp: 20   Temp: 98.2 F (36.8 C)       Body mass index is 28.03 kg/m.      General: Well-developed, well-nourished, no acute distress. Eyes: Pink conjunctiva, anicteric sclera. HEENT: Oropharynx clear  Lungs: Clear to auscultation bilaterally. Heart: Regular rate and rhythm. No rubs, murmurs, or gallops. Abdomen: Soft, nontender, nondistended. No organomegaly noted, normoactive bowel sounds. Musculoskeletal: No edema, cyanosis, or clubbing. Neuro: Alert, answering all questions appropriately. Cranial nerves grossly intact. Skin: No rashes or petechiae noted. Psych: Normal affect. Lymphatics: Right-sided neck swelling has improved.  Continues to have palpable about 2 to 3 cm right cervical lymph node.  Tenderness improved.   LAB RESULTS:  Lab Results  Component Value Date   NA 136 10/24/2022   K 4.2 10/24/2022   CL 102 10/24/2022   CO2 27 10/24/2022   GLUCOSE 145 (H) 10/24/2022   BUN 16 10/24/2022   CREATININE 0.78 10/24/2022   CALCIUM 9.4 10/24/2022   PROT 5.8 (L) 09/10/2022   ALBUMIN 3.6 09/10/2022   AST 19 09/10/2022   ALT 20 09/10/2022   ALKPHOS 72 09/10/2022   BILITOT 0.6 09/10/2022   GFRNONAA >60 10/24/2022   GFRAA 89 11/19/2019    Lab Results  Component Value Date   WBC 7.3 07/16/2023   NEUTROABS 4.7 07/16/2023   HGB 11.8 (L) 07/16/2023   HCT 35.8 (L) 07/16/2023   MCV 83.4 07/16/2023   PLT 146 (L) 07/16/2023    Lab Results  Component Value Date   TIBC 332 01/12/2023   TIBC 333 09/12/2022   TIBC 322 06/15/2022   FERRITIN 91 01/12/2023   FERRITIN 165 09/12/2022   FERRITIN 310 (H) 06/15/2022   IRONPCTSAT 16 01/12/2023   IRONPCTSAT 20 09/12/2022   IRONPCTSAT 23 06/15/2022     STUDIES: No results found.  ASSESSMENT AND PLAN:   Katie Buckley is a 73 y.o. female with pmh of aortic stenosis status post bioprosthetic valve in May 2023, Barrett's esophagus, anxiety, and skin cancer follows with hematology for iron deficiency anemia.  #Iron deficiency anemia -Completed 2 doses of IV Feraheme in October 2023.  Repeat iron panel showed good response to the treatment.  -s/p colonoscopy with Dr. Tobi Bastos in January 2024 which showed a 5 mm polyp in the rectum otherwise unremarkable.   -Endoscopy repeated with Dr. Tobi Bastos in July 2024 Which showed esophageal mucosal changes suspicious for Barrett's esophagus, large amount of food was present in the entire stomach.  Pathology was negative for Barrett's, dysplasia or malignancy.  -Hemoglobin remained stable at 11.8.  Iron panel is pending.  Her iron levels have remained stable for over a year now.  Will space out visits to yearly.  # Thrombocytopenia -mild.  Platelets today 146.  Monitor.  CT abdomen from September 2023 showed normal liver and spleen.  #Hypertension-  per Cardiology  # Aortic stenosis, S/P TAVR, continue Eliquis for anticoagulation # GERD- on rabeprazole. Follows with GI # Type 2 DM, management per PCP Hyperlipidemia, continue Crestor for lipid management   #Balance problems, falls - follows with Dr. Sherryll Burger, neurology.   Orders Placed This Encounter  Procedures   CBC with Differential (Cancer Center Only)   Ferritin   Iron and TIBC([facility])   Vitamin B12    RTC in 1 year for MD visit, labs.  Patient expressed understanding and was in agreement with this plan. She also understands that She can call clinic at any time with any questions, concerns, or complaints.   I spent a total of 25 minutes reviewing chart data, face-to-face evaluation with the patient, counseling and coordination of care as detailed above.  Michaelyn Barter, MD   07/16/2023 1:49 PM

## 2023-07-17 ENCOUNTER — Other Ambulatory Visit: Payer: PPO

## 2023-07-17 ENCOUNTER — Ambulatory Visit: Payer: PPO | Admitting: Internal Medicine

## 2023-07-17 ENCOUNTER — Ambulatory Visit: Payer: PPO | Admitting: Family

## 2023-07-23 DIAGNOSIS — G4700 Insomnia, unspecified: Secondary | ICD-10-CM | POA: Diagnosis not present

## 2023-07-23 DIAGNOSIS — F411 Generalized anxiety disorder: Secondary | ICD-10-CM | POA: Diagnosis not present

## 2023-07-23 DIAGNOSIS — F331 Major depressive disorder, recurrent, moderate: Secondary | ICD-10-CM | POA: Diagnosis not present

## 2023-07-23 DIAGNOSIS — R41840 Attention and concentration deficit: Secondary | ICD-10-CM | POA: Diagnosis not present

## 2023-07-24 ENCOUNTER — Ambulatory Visit: Payer: PPO | Admitting: Family

## 2023-07-24 VITALS — BP 128/56 | HR 50 | Temp 98.1°F | Ht 60.0 in

## 2023-07-24 DIAGNOSIS — I10 Essential (primary) hypertension: Secondary | ICD-10-CM | POA: Diagnosis not present

## 2023-07-24 DIAGNOSIS — E13319 Other specified diabetes mellitus with unspecified diabetic retinopathy without macular edema: Secondary | ICD-10-CM

## 2023-07-24 DIAGNOSIS — E1343 Other specified diabetes mellitus with diabetic autonomic (poly)neuropathy: Secondary | ICD-10-CM | POA: Insufficient documentation

## 2023-07-24 DIAGNOSIS — E1165 Type 2 diabetes mellitus with hyperglycemia: Secondary | ICD-10-CM | POA: Diagnosis not present

## 2023-07-24 DIAGNOSIS — E782 Mixed hyperlipidemia: Secondary | ICD-10-CM | POA: Diagnosis not present

## 2023-07-24 DIAGNOSIS — Z7984 Long term (current) use of oral hypoglycemic drugs: Secondary | ICD-10-CM

## 2023-07-24 DIAGNOSIS — K219 Gastro-esophageal reflux disease without esophagitis: Secondary | ICD-10-CM

## 2023-07-24 LAB — COMPREHENSIVE METABOLIC PANEL
ALT: 26 U/L (ref 0–35)
AST: 31 U/L (ref 0–37)
Albumin: 4.4 g/dL (ref 3.5–5.2)
Alkaline Phosphatase: 90 U/L (ref 39–117)
BUN: 14 mg/dL (ref 6–23)
CO2: 31 meq/L (ref 19–32)
Calcium: 9.4 mg/dL (ref 8.4–10.5)
Chloride: 97 meq/L (ref 96–112)
Creatinine, Ser: 0.8 mg/dL (ref 0.40–1.20)
GFR: 73.85 mL/min (ref >60.00)
Glucose, Bld: 284 mg/dL — ABNORMAL HIGH (ref 70–99)
Potassium: 4.5 meq/L (ref 3.5–5.1)
Sodium: 134 meq/L — ABNORMAL LOW (ref 135–145)
Total Bilirubin: 0.4 mg/dL (ref 0.2–1.2)
Total Protein: 6.7 g/dL (ref 6.0–8.3)

## 2023-07-24 LAB — LIPID PANEL
Cholesterol: 182 mg/dL (ref 0–200)
HDL: 95.2 mg/dL (ref >39.00)
LDL Cholesterol: 53 mg/dL (ref 0–99)
NonHDL: 86.78
Total CHOL/HDL Ratio: 2
Triglycerides: 171 mg/dL — ABNORMAL HIGH (ref 0.0–149.0)
VLDL: 34.2 mg/dL (ref 0.0–40.0)

## 2023-07-24 LAB — HEMOGLOBIN A1C: Hgb A1c MFr Bld: 10.7 % — ABNORMAL HIGH (ref 4.6–6.5)

## 2023-07-24 MED ORDER — IRBESARTAN 75 MG PO TABS: 75.0000 mg | ORAL_TABLET | Freq: Every day | ORAL | 4 refills | Status: DC

## 2023-07-24 MED ORDER — PREGABALIN 75 MG PO CAPS: 75.0000 mg | ORAL_CAPSULE | Freq: Two times a day (BID) | ORAL | Status: DC

## 2023-07-24 NOTE — Assessment & Plan Note (Signed)
Continue medication as prescribed stable

## 2023-07-24 NOTE — Assessment & Plan Note (Signed)
From note do see pt and Gi discussed reglan, pt to f/u with GI for more information if diet does not help improve symptoms.  Encouraged gastroporesis diet, and reinitiated nutritionist referral for this.  Diet may be helpful. Will hold on GLP 1 at this time as do not want to worsen symptoms.

## 2023-07-24 NOTE — Assessment & Plan Note (Signed)
Ordered lipid panel, pending results. Work on low cholesterol diet and exercise as tolerated Continue zetia and also rosuvastatin 20 mg nightly

## 2023-07-24 NOTE — Progress Notes (Signed)
Established Patient Office Visit  Subjective:   Patient ID: Katie Buckley, female    DOB: Oct 14, 1950  Age: 73 y.o. MRN: [id number]  CC:  Chief Complaint  Patient presents with   Medical Management of Chronic Issues    HPI: Katie Buckley is a 73 y.o. female presenting on 07/24/2023 for Medical Management of Chronic Issues  Diabetes: she is currently taking metformin 500 mg XR once daily. She is tolerating well. She checks her sugars but 'not that often' she also takes glipizde 5 mg once daily. She is wondering about a GLP 1   Lab Results  Component Value Date   HGBA1C 8.1 (H) 10/24/2022     EGD: was found to have gastroparesis. She is followed by Dr. Tobi Bastos, per his note 9/10 was referred for dietitian but pt states did not receive a phone call to have this set up as he had recommended the gastroparesis diet. She has been trying to follow the diet but hard at times, has tried to cut down on sodas. She is taking rabeprazole once daily 20 mg. Per his note they did discuss possible reglan but pt had declined starting (she states she does not remember this discussion)     ROS: Negative unless specifically indicated above in HPI.   Relevant past medical history reviewed and updated as indicated.   Allergies and medications reviewed and updated.   Current Outpatient Medications:    acetaminophen (TYLENOL) 650 MG CR tablet, Take 1,300 mg by mouth every 8 (eight) hours as needed for pain (pain)., Disp: , Rfl:    Blood Glucose Monitoring Suppl (CONTOUR NEXT MONITOR) w/Device KIT, Use to check blood sugar daily, Disp: 1 kit, Rfl: 0   buPROPion (WELLBUTRIN SR) 150 MG 12 hr tablet, Take 150 mg by mouth 2 (two) times daily., Disp: , Rfl:    butalbital-acetaminophen-caffeine (FIORICET) 50-325-40 MG tablet, TAKE 1 TABLET BY MOUTH EVERY 6 HOURS AS NEEDED FOR HEADACHE., Disp: 30 tablet, Rfl: 4   ezetimibe (ZETIA) 10 MG tablet, Take 1 tablet (10 mg total) by mouth at bedtime., Disp: 90  tablet, Rfl: 3   FLUoxetine (PROZAC) 40 MG capsule, Take 40 mg by mouth daily., Disp: , Rfl:    fluticasone (FLONASE) 50 MCG/ACT nasal spray, Place 1 spray into both nostrils daily as needed for allergies or rhinitis., Disp: , Rfl:    glipiZIDE (GLUCOTROL) 5 MG tablet, Take 1 tablet (5 mg total) by mouth daily., Disp: 90 tablet, Rfl: 3   glucose blood (CONTOUR NEXT TEST) test strip, Use to check blood sugar once daily, Disp: 100 each, Rfl: 3   Lancet Devices MISC, , Disp: , Rfl:    metFORMIN (GLUCOPHAGE-XR) 500 MG 24 hr tablet, TAKE 2 TABLETS BY MOUTH EVERY DAY, Disp: 180 tablet, Rfl: 3   metoprolol tartrate (LOPRESSOR) 50 MG tablet, Take 1 tablet (50 mg total) by mouth 2 (two) times daily., Disp: 60 tablet, Rfl: 2   ondansetron (ZOFRAN) 4 MG tablet, TAKE 1 TABLET BY MOUTH DAILY AS NEEDED FOR NAUSEA OR VOMITING., Disp: 90 tablet, Rfl: 0   RABEprazole (ACIPHEX) 20 MG tablet, Take 1 tablet (20 mg total) by mouth 2 times daily at 12 noon and 4 pm., Disp: 180 tablet, Rfl: 3   rosuvastatin (CRESTOR) 20 MG tablet, TAKE 1 TABLET BY MOUTH EVERY DAY IN THE EVENING, Disp: 90 tablet, Rfl: 4   traZODone (DESYREL) 50 MG tablet, Take 50-100 mg by mouth at bedtime., Disp: , Rfl:    Vibegron (  GEMTESA) 75 MG TABS, Take 1 tablet (75 mg total) by mouth daily., Disp: , Rfl:    amLODipine (NORVASC) 5 MG tablet, Take 1 tablet (5 mg total) by mouth daily., Disp: 30 tablet, Rfl: 1   apixaban (ELIQUIS) 5 MG TABS tablet, Take 1 tablet (5 mg total) by mouth 2 (two) times daily., Disp: 60 tablet, Rfl: 2   irbesartan (AVAPRO) 75 MG tablet, Take 1 tablet (75 mg total) by mouth daily., Disp: 90 tablet, Rfl: 4   pregabalin (LYRICA) 75 MG capsule, Take 1 capsule (75 mg total) by mouth 2 (two) times daily., Disp: , Rfl:   Allergies  Allergen Reactions   Doxycycline Other (See Comments)    Emotional changes/anxious   Biaxin [Clarithromycin]     GI upset, and bad taste in mouth.    Objective:   BP (!) 128/56   Pulse (!) 50    Temp 98.1 F (36.7 C) (Oral)   Ht 5' (1.524 m)   SpO2 98%   BMI 28.03 kg/m    Physical Exam Constitutional:      General: She is not in acute distress.    Appearance: Normal appearance. She is normal weight. She is not ill-appearing, toxic-appearing or diaphoretic.  HENT:     Head: Normocephalic.  Cardiovascular:     Rate and Rhythm: Normal rate and regular rhythm.     Pulses:          Dorsalis pedis pulses are 1+ on the right side and 1+ on the left side.       Posterior tibial pulses are 2+ on the right side and 2+ on the left side.     Heart sounds: Murmur heard.  Pulmonary:     Effort: Pulmonary effort is normal.     Breath sounds: Normal breath sounds.  Musculoskeletal:        General: Normal range of motion.     Right lower leg: No edema.     Left lower leg: No edema.  Neurological:     General: No focal deficit present.     Mental Status: She is alert and oriented to person, place, and time. Mental status is at baseline.  Psychiatric:        Mood and Affect: Mood normal.        Behavior: Behavior normal.        Thought Content: Thought content normal.        Judgment: Judgment normal.    Title   Diabetic Foot Exam - detailed Is there a history of foot ulcer?: No Is there a foot ulcer now?: No Is there swelling?: No Is there elevated skin temperature?: No Is there abnormal foot shape?: No Is there a claw toe deformity?: No Are the toenails long?: No Are the toenails thick?: No Are the toenails ingrown?: No Is the skin thin, fragile, shiny and hairless?": No Normal Range of Motion?: Yes Is there foot or ankle muscle weakness?: No Do you have pain in calf while walking?: No Are the shoes appropriate in style and fit?: Yes Can the patient see the bottom of their feet?: Yes Right Posterior Tibialis: Diminished Left posterior Tibialis: Diminished   Right Dorsalis Pedis: Present Left Dorsalis Pedis: Present     Semmes-Weinstein Monofilament Test "+" means  "has sensation" and "-" means "no sensation"  R Foot Test Control: Pos L Foot Test Control: Pos   R Site 1-Great Toe: Pos L Site 1-Great Toe: Pos   R Site 4: Pos L  Site 4: Pos   R site 5: Pos L Site 5: Pos  R Site 6: Pos L Site 6: Pos     Image components are not supported.   Image components are not supported. Image components are not supported.  [place] Comments      Assessment & Plan:  Gastroparesis due to secondary diabetes [facility]) Assessment & Plan: From note do see pt and Gi discussed reglan, pt to f/u with GI for more information if diet does not help improve symptoms.  Encouraged gastroporesis diet, and reinitiated nutritionist referral for this.  Diet may be helpful. Will hold on GLP 1 at this time as do not want to worsen symptoms.   Orders: -     Referral to Nutrition and Diabetes Services  Other specified diabetes mellitus with unspecified diabetic retinopathy without macular edema (HCC) -     Pregabalin; Take 1 capsule (75 mg total) by mouth 2 (two) times daily.  Essential hypertension Assessment & Plan: Continue medication as prescribed stable  Orders: -     Irbesartan; Take 1 tablet (75 mg total) by mouth daily.  Dispense: 90 tablet; Refill: 4  Type 2 diabetes mellitus with hyperglycemia, unspecified whether long term insulin use (HCC) Assessment & Plan: Continue glipizide 5 mg once daily as well as metformin 500 mg xr once daily.  Glp 1 probably not a good choice due to gastroparesis  Can consider Dpp 4 and or SGLT2  Orders: -     Microalbumin / creatinine urine ratio -     Hemoglobin A1c -     Comprehensive metabolic panel  Hypomagnesemia  Hyperlipidemia, mixed Assessment & Plan: Ordered lipid panel, pending results. Work on low cholesterol diet and exercise as tolerated Continue zetia and also rosuvastatin 20 mg nightly   Orders: -     Lipid panel  Gastroesophageal reflux disease without esophagitis Assessment & Plan: Try to decrease  and or avoid spicy foods, fried fatty foods, and also caffeine and chocolate as these can increase heartburn symptoms.        Follow up plan: Return in about 3 months (around 10/21/2023) for f/u diabetes.  Mort Sawyers, FNP

## 2023-07-24 NOTE — Assessment & Plan Note (Signed)
Continue glipizide 5 mg once daily as well as metformin 500 mg xr once daily.  Glp 1 probably not a good choice due to gastroparesis  Can consider Dpp 4 and or SGLT2

## 2023-07-24 NOTE — Assessment & Plan Note (Signed)
Try to decrease and or avoid spicy foods, fried fatty foods, and also caffeine and chocolate as these can increase heartburn symptoms.

## 2023-07-24 NOTE — Patient Instructions (Addendum)
  A referral was placed today for a nutritionist.  Please let us know if you have not heard back within 2 weeks about the referral.  check at home if you are taking pregablin (also known as lyrica) They are the same medication  So you only need to take 75 mg twice daily

## 2023-07-26 ENCOUNTER — Other Ambulatory Visit: Payer: Self-pay | Admitting: Family

## 2023-07-26 LAB — MICROALBUMIN / CREATININE URINE RATIO
Creatinine,U: 59.5 mg/dL
Microalb Creat Ratio: 97.2 mg/g — ABNORMAL HIGH (ref 0.0–30.0)
Microalb, Ur: 5.8 mg/dL — ABNORMAL HIGH (ref 0.0–1.9)

## 2023-07-26 NOTE — Progress Notes (Signed)
Dr. Tobi Bastos, I wanted to reach out and ask you about starting a gLP 1. Pt inquiring if she can start one, I was a bit hesitant due to recent finding of gastroparesis. Thoughts?   Her A1C is very much uncontrolled so would be good idea in that area.

## 2023-07-29 NOTE — Progress Notes (Signed)
Can we get pt in again for visit ? I want to discuss with her starting insulin.  Tell her I also spoke with Dr. Tobi Bastos her GI and GLP 1 (injection) he agreed would make her feel much worse as she already has gastroporesis

## 2023-07-31 ENCOUNTER — Encounter: Payer: Self-pay | Admitting: Plastic Surgery

## 2023-07-31 ENCOUNTER — Ambulatory Visit: Payer: PPO | Admitting: Plastic Surgery

## 2023-07-31 VITALS — BP 170/91 | HR 66 | Ht 60.0 in | Wt 144.8 lb

## 2023-07-31 DIAGNOSIS — T8543XA Leakage of breast prosthesis and implant, initial encounter: Secondary | ICD-10-CM

## 2023-07-31 DIAGNOSIS — N6489 Other specified disorders of breast: Secondary | ICD-10-CM

## 2023-07-31 NOTE — Progress Notes (Signed)
   Subjective:    Patient ID: Katie Buckley, female    DOB: 01/20/1951, 73 y.o.   MRN: [id number]  The patient is a 73 year old female here for follow-up on her breast implants.  She had surgery in May 2024 with bilateral removal of ruptured breast implants and complete capsulectomies.  The implants were placed when she was about 73 years old and then repeat placed when she was in her 30s.  She had a report of a rupture so decided to have both of them removed.  Overall she is doing well.  She has a little bit of asymmetry with loss of volume on the right breast.  She is happy with the left side.      Review of Systems  Constitutional: Negative.   HENT: Negative.    Eyes: Negative.   Respiratory: Negative.    Cardiovascular: Negative.   Gastrointestinal: Negative.   Endocrine: Negative.   Genitourinary: Negative.   Musculoskeletal: Negative.        Objective:   Physical Exam Vitals reviewed.  Constitutional:      Appearance: Normal appearance.  HENT:     Head: Atraumatic.  Cardiovascular:     Rate and Rhythm: Normal rate.     Pulses: Normal pulses.  Pulmonary:     Effort: Pulmonary effort is normal.  Abdominal:     Palpations: Abdomen is soft.  Skin:    General: Skin is warm.     Capillary Refill: Capillary refill takes less than 2 seconds.  Neurological:     Mental Status: She is alert and oriented to person, place, and time.  Psychiatric:        Mood and Affect: Mood normal.        Behavior: Behavior normal.        Thought Content: Thought content normal.        Judgment: Judgment normal.       Assessment & Plan:     ICD-10-CM   1. Rupture of implant of right breast, initial encounter  T85.43XA     2. Postoperative breast asymmetry  N64.89       Pictures were obtained of the patient and placed in the chart with the patient's or guardian's permission.  The patient would like to see if she can do some fat grafting to the right breast for improved  symmetry.

## 2023-08-01 ENCOUNTER — Encounter: Payer: Self-pay | Admitting: Family

## 2023-08-01 ENCOUNTER — Telehealth (INDEPENDENT_AMBULATORY_CARE_PROVIDER_SITE_OTHER): Payer: PPO | Admitting: Family

## 2023-08-01 DIAGNOSIS — Z7984 Long term (current) use of oral hypoglycemic drugs: Secondary | ICD-10-CM

## 2023-08-01 DIAGNOSIS — E782 Mixed hyperlipidemia: Secondary | ICD-10-CM | POA: Diagnosis not present

## 2023-08-01 DIAGNOSIS — E1165 Type 2 diabetes mellitus with hyperglycemia: Secondary | ICD-10-CM | POA: Diagnosis not present

## 2023-08-01 MED ORDER — GLIPIZIDE 5 MG PO TABS: 5.0000 mg | ORAL_TABLET | Freq: Two times a day (BID) | ORAL | 3 refills | Status: DC

## 2023-08-01 NOTE — Assessment & Plan Note (Signed)
Increase glipizide to 5 mg twice daily Will slowly increase metformin XR milligrams twice daily to 2 tablets twice daily. GFR reviewed prior Urine microalbumin and positive continue with irbesartan  Patient prefers glipizide increased over insulin for now We will have patient follow-up in 1 month with blood sugar average.  Did advise patient to check fasting sugar every morning for goal less than 140  Discussed diet and exercise as tolerated.  Patient declines diabetic educator at this time but may consider in the future

## 2023-08-01 NOTE — Assessment & Plan Note (Addendum)
Cholesterol at goal currently, continue Zetia and rosuvastatin 20 mg nightly continue to work on a low-cholesterol diet

## 2023-08-01 NOTE — Patient Instructions (Addendum)
  Increase metformin 500 mg to 2 tablets in the am, and one tablet at night time.  After one week if your stomach is doing well increase to two tablets in am and two tablets at night time.   Also I will have you increase your glipizide to 5 mg twice a day.  Monitor your fasting sugars in the am, goal is less than 140.   Monitor for signs of hypoglycemia.  If you notice any shaking, termors, feelings clammy, brain fog check your sugar to make sure not too low (< 100)

## 2023-08-01 NOTE — Progress Notes (Signed)
Virtual Visit via Video note  I connected with Katie Buckley on 08/01/23 at home by video and verified that I am speaking with the correct person using two identifiers.The provider, Mort Sawyers, FNP is located in their home at time of visit.  I discussed the limitations, risks, security and privacy concerns of performing an evaluation and management service by video and the availability of in person appointments. I also discussed with the patient that there may be a patient responsible charge related to this service. The patient expressed understanding and agreed to proceed.  Subjective: PCP: Mort Sawyers, FNP  Chief Complaint  Patient presents with   medication managment    Diabetes medication     HPI   DM2: she does drink a lot of sweet drinks, she mainly drinks soda. She is taking metformin 500 XR twice daily.   HLD: at goal LDL < 70  On rosuvastatin 20 mg and zetia 10 mg once daily.       ROS: Per HPI  Current Outpatient Medications:    acetaminophen (TYLENOL) 650 MG CR tablet, Take 1,300 mg by mouth every 8 (eight) hours as needed for pain (pain)., Disp: , Rfl:    Blood Glucose Monitoring Suppl (CONTOUR NEXT MONITOR) w/Device KIT, Use to check blood sugar daily, Disp: 1 kit, Rfl: 0   buPROPion (WELLBUTRIN SR) 150 MG 12 hr tablet, Take 150 mg by mouth 2 (two) times daily., Disp: , Rfl:    butalbital-acetaminophen-caffeine (FIORICET) 50-325-40 MG tablet, TAKE 1 TABLET BY MOUTH EVERY 6 HOURS AS NEEDED FOR HEADACHE., Disp: 30 tablet, Rfl: 4   FLUoxetine (PROZAC) 40 MG capsule, Take 40 mg by mouth daily., Disp: , Rfl:    fluticasone (FLONASE) 50 MCG/ACT nasal spray, Place 1 spray into both nostrils daily as needed for allergies or rhinitis., Disp: , Rfl:    glipiZIDE (GLUCOTROL) 5 MG tablet, Take 1 tablet (5 mg total) by mouth daily., Disp: 90 tablet, Rfl: 3   glucose blood (CONTOUR NEXT TEST) test strip, Use to check blood sugar once daily, Disp: 100 each, Rfl: 3    irbesartan (AVAPRO) 75 MG tablet, Take 1 tablet (75 mg total) by mouth daily., Disp: 90 tablet, Rfl: 4   Lancet Devices MISC, , Disp: , Rfl:    metFORMIN (GLUCOPHAGE-XR) 500 MG 24 hr tablet, TAKE 2 TABLETS BY MOUTH EVERY DAY, Disp: 180 tablet, Rfl: 3   metoprolol tartrate (LOPRESSOR) 50 MG tablet, Take 1 tablet (50 mg total) by mouth 2 (two) times daily., Disp: 60 tablet, Rfl: 2   ondansetron (ZOFRAN) 4 MG tablet, TAKE 1 TABLET BY MOUTH DAILY AS NEEDED FOR NAUSEA OR VOMITING., Disp: 90 tablet, Rfl: 0   pregabalin (LYRICA) 75 MG capsule, Take 1 capsule (75 mg total) by mouth 2 (two) times daily., Disp: , Rfl:    RABEprazole (ACIPHEX) 20 MG tablet, Take 1 tablet (20 mg total) by mouth 2 times daily at 12 noon and 4 pm., Disp: 180 tablet, Rfl: 3   rosuvastatin (CRESTOR) 20 MG tablet, TAKE 1 TABLET BY MOUTH EVERY DAY IN THE EVENING, Disp: 90 tablet, Rfl: 4   traZODone (DESYREL) 50 MG tablet, Take 50-100 mg by mouth at bedtime., Disp: , Rfl:    amLODipine (NORVASC) 5 MG tablet, Take 1 tablet (5 mg total) by mouth daily., Disp: 30 tablet, Rfl: 1   apixaban (ELIQUIS) 5 MG TABS tablet, Take 1 tablet (5 mg total) by mouth 2 (two) times daily., Disp: 60 tablet, Rfl: 2  ezetimibe (ZETIA) 10 MG tablet, Take 1 tablet (10 mg total) by mouth at bedtime., Disp: 90 tablet, Rfl: 3   Vibegron (GEMTESA) 75 MG TABS, Take 1 tablet (75 mg total) by mouth daily. (Patient not taking: Reported on 08/01/2023), Disp: , Rfl:   Observations/Objective: Physical Exam Constitutional:      General: She is not in acute distress.    Appearance: Normal appearance. She is not ill-appearing.  Pulmonary:     Effort: Pulmonary effort is normal.  Neurological:     General: No focal deficit present.     Mental Status: She is alert and oriented to person, place, and time.  Psychiatric:        Mood and Affect: Mood normal.        Behavior: Behavior normal.        Thought Content: Thought content normal.      Type 2 diabetes  mellitus with hyperglycemia, without long-term current use of insulin (HCC) Assessment & Plan: Increase glipizide to 5 mg twice daily Will slowly increase metformin XR milligrams twice daily to 2 tablets twice daily. GFR reviewed prior Urine microalbumin and positive continue with irbesartan  Patient prefers glipizide increased over insulin for now We will have patient follow-up in 1 month with blood sugar average.  Did advise patient to check fasting sugar every morning for goal less than 140  Discussed diet and exercise as tolerated.  Patient declines diabetic educator at this time but may consider in the future  Orders: -     glipiZIDE; Take 1 tablet (5 mg total) by mouth 2 (two) times daily before a meal.  Dispense: 180 tablet; Refill: 3  Hyperlipidemia, mixed Assessment & Plan: Cholesterol at goal currently, continue Zetia and rosuvastatin 20 mg nightly continue to work on a low-cholesterol diet       I discussed the assessment and treatment plan with the patient. The patient was provided an opportunity to ask questions and all were answered. The patient agreed with the plan and demonstrated an understanding of the instructions.   The patient was advised to call back or seek an in-person evaluation if the symptoms worsen or if the condition fails to improve as anticipated.  The above assessment and management plan was discussed with the patient. The patient verbalized understanding of and has agreed to the management plan. Patient is aware to call the clinic if symptoms persist or worsen. Patient is aware when to return to the clinic for a follow-up visit. Patient educated on when it is appropriate to go to the emergency department.     Mort Sawyers, MSN, APRN, FNP-C [facility]

## 2023-08-09 ENCOUNTER — Other Ambulatory Visit: Payer: Self-pay | Admitting: Urology

## 2023-08-09 ENCOUNTER — Other Ambulatory Visit: Payer: Self-pay | Admitting: Family

## 2023-08-09 DIAGNOSIS — E782 Mixed hyperlipidemia: Secondary | ICD-10-CM

## 2023-08-09 DIAGNOSIS — E13319 Other specified diabetes mellitus with unspecified diabetic retinopathy without macular edema: Secondary | ICD-10-CM

## 2023-08-09 MED ORDER — EZETIMIBE 10 MG PO TABS: 10.0000 mg | ORAL_TABLET | Freq: Every day | ORAL | 3 refills | Status: DC

## 2023-08-14 ENCOUNTER — Emergency Department

## 2023-08-14 ENCOUNTER — Emergency Department
Admission: EM | Admit: 2023-08-14 | Discharge: 2023-08-14 | Disposition: A | Discharge status: Home / Self Care | Attending: Emergency Medicine | Admitting: Emergency Medicine

## 2023-08-14 DIAGNOSIS — Z7901 Long term (current) use of anticoagulants: Secondary | ICD-10-CM | POA: Insufficient documentation

## 2023-08-14 DIAGNOSIS — I10 Essential (primary) hypertension: Secondary | ICD-10-CM | POA: Diagnosis not present

## 2023-08-14 DIAGNOSIS — R739 Hyperglycemia, unspecified: Secondary | ICD-10-CM | POA: Diagnosis not present

## 2023-08-14 DIAGNOSIS — W19XXXA Unspecified fall, initial encounter: Secondary | ICD-10-CM

## 2023-08-14 DIAGNOSIS — S0990XA Unspecified injury of head, initial encounter: Secondary | ICD-10-CM | POA: Diagnosis not present

## 2023-08-14 DIAGNOSIS — S42352A Displaced comminuted fracture of shaft of humerus, left arm, initial encounter for closed fracture: Secondary | ICD-10-CM | POA: Diagnosis not present

## 2023-08-14 DIAGNOSIS — I509 Heart failure, unspecified: Secondary | ICD-10-CM | POA: Diagnosis not present

## 2023-08-14 DIAGNOSIS — R519 Headache, unspecified: Secondary | ICD-10-CM | POA: Diagnosis not present

## 2023-08-14 DIAGNOSIS — M7662 Achilles tendinitis, left leg: Secondary | ICD-10-CM | POA: Diagnosis not present

## 2023-08-14 DIAGNOSIS — R609 Edema, unspecified: Secondary | ICD-10-CM | POA: Diagnosis not present

## 2023-08-14 DIAGNOSIS — S42202A Unspecified fracture of upper end of left humerus, initial encounter for closed fracture: Secondary | ICD-10-CM | POA: Diagnosis not present

## 2023-08-14 DIAGNOSIS — M25572 Pain in left ankle and joints of left foot: Secondary | ICD-10-CM | POA: Diagnosis not present

## 2023-08-14 DIAGNOSIS — M542 Cervicalgia: Secondary | ICD-10-CM | POA: Diagnosis not present

## 2023-08-14 DIAGNOSIS — E119 Type 2 diabetes mellitus without complications: Secondary | ICD-10-CM | POA: Diagnosis not present

## 2023-08-14 DIAGNOSIS — M19012 Primary osteoarthritis, left shoulder: Secondary | ICD-10-CM | POA: Diagnosis not present

## 2023-08-14 DIAGNOSIS — I2510 Atherosclerotic heart disease of native coronary artery without angina pectoris: Secondary | ICD-10-CM | POA: Insufficient documentation

## 2023-08-14 DIAGNOSIS — S4292XA Fracture of left shoulder girdle, part unspecified, initial encounter for closed fracture: Secondary | ICD-10-CM | POA: Diagnosis not present

## 2023-08-14 DIAGNOSIS — I959 Hypotension, unspecified: Secondary | ICD-10-CM | POA: Diagnosis not present

## 2023-08-14 DIAGNOSIS — W06XXXA Fall from bed, initial encounter: Secondary | ICD-10-CM | POA: Insufficient documentation

## 2023-08-14 DIAGNOSIS — M79632 Pain in left forearm: Secondary | ICD-10-CM | POA: Diagnosis not present

## 2023-08-14 LAB — CBC WITH DIFFERENTIAL/PLATELET
Abs Immature Granulocytes: 0.03 10*3/uL (ref 0.00–0.07)
Basophils Absolute: 0 10*3/uL (ref 0.0–0.1)
Basophils Relative: 0 %
Eosinophils Absolute: 0.2 10*3/uL (ref 0.0–0.5)
Eosinophils Relative: 2 %
HCT: 35 % — ABNORMAL LOW (ref 36.0–46.0)
Hemoglobin: 11.9 g/dL — ABNORMAL LOW (ref 12.0–15.0)
Immature Granulocytes: 0 %
Lymphocytes Relative: 25 %
Lymphs Abs: 2 10*3/uL (ref 0.7–4.0)
MCH: 28.2 pg (ref 26.0–34.0)
MCHC: 34 g/dL (ref 30.0–36.0)
MCV: 82.9 fL (ref 80.0–100.0)
Monocytes Absolute: 0.7 10*3/uL (ref 0.1–1.0)
Monocytes Relative: 9 %
Neutro Abs: 5.1 10*3/uL (ref 1.7–7.7)
Neutrophils Relative %: 64 %
Platelets: 144 10*3/uL — ABNORMAL LOW (ref 150–400)
RBC: 4.22 MIL/uL (ref 3.87–5.11)
RDW: 12.7 % (ref 11.5–15.5)
WBC: 8 10*3/uL (ref 4.0–10.5)
nRBC: 0 % (ref 0.0–0.2)

## 2023-08-14 LAB — COMPREHENSIVE METABOLIC PANEL
ALT: 24 U/L (ref 0–44)
AST: 32 U/L (ref 15–41)
Albumin: 3.8 g/dL (ref 3.5–5.0)
Alkaline Phosphatase: 63 U/L (ref 38–126)
Anion gap: 10 (ref 5–15)
BUN: 16 mg/dL (ref 8–23)
CO2: 25 mmol/L (ref 22–32)
Calcium: 8.7 mg/dL — ABNORMAL LOW (ref 8.9–10.3)
Chloride: 97 mmol/L — ABNORMAL LOW (ref 98–111)
Creatinine, Ser: 0.76 mg/dL (ref 0.44–1.00)
GFR, Estimated: >60 mL/min (ref >60)
Glucose, Bld: 311 mg/dL — ABNORMAL HIGH (ref 70–99)
Potassium: 3.9 mmol/L (ref 3.5–5.1)
Sodium: 132 mmol/L — ABNORMAL LOW (ref 135–145)
Total Bilirubin: 0.8 mg/dL (ref 0.0–1.2)
Total Protein: 6.3 g/dL — ABNORMAL LOW (ref 6.5–8.1)

## 2023-08-14 LAB — PROTIME-INR
INR: 1.1 (ref 0.8–1.2)
Prothrombin Time: 14.3 s (ref 11.4–15.2)

## 2023-08-14 MED ORDER — ONDANSETRON 4 MG PO TBDP
ORAL_TABLET | ORAL | Status: AC
  Filled 2023-08-14: qty 1

## 2023-08-14 MED ORDER — MORPHINE SULFATE (PF) 4 MG/ML IV SOLN
4.0000 mg | Freq: Once | INTRAVENOUS | Status: AC
  Administered 2023-08-14: 4 mg via INTRAVENOUS
  Filled 2023-08-14: qty 1

## 2023-08-14 MED ORDER — ONDANSETRON 4 MG PO TBDP
4.0000 mg | ORAL_TABLET | Freq: Once | ORAL | Status: AC
  Administered 2023-08-14: 4 mg via ORAL

## 2023-08-14 MED ORDER — OXYCODONE HCL 5 MG PO CAPS: 5.0000 mg | ORAL_CAPSULE | Freq: Four times a day (QID) | ORAL | 0 refills | Status: DC | PRN

## 2023-08-14 NOTE — ED Notes (Addendum)
 Pt nauseated, req zofran. Verbal order from Tan MD for 4mg  zofran odt  Applied shoulder immobilizer to L arm, provided education to pt and husband

## 2023-08-14 NOTE — ED Notes (Signed)
 ED Provider at bedside.

## 2023-08-14 NOTE — ED Triage Notes (Signed)
 Fall from bed BIB EMS. Patient hit the floor and injured her L arm and L ankle. EMS states deformity to L upper arm.

## 2023-08-14 NOTE — ED Provider Notes (Signed)
 Katie Buckley Provider Note    Event Date/Time   First MD Initiated Contact with Patient 08/14/23 1834     (approximate)   History   Arm Injury   HPI  Katie Buckley is a 73 y.o. female with history of diabetes, CAD, CHF, paroxysmal A-fib on Eliquis presenting with left shoulder and ankle pain after a fall.  Per EMS patient was trying to change the event, fell off her bed when she tripped, did hit the floor onto her left side.  Unsure if she hit her head.  No LOC.  EMS gave her fentanyl for pain.  On independent chart review, she was seen by her primary care provider on 19 February, is on glipizide for her diabetes, on Ruba statin as well as Zetia for hyperlipidemia.     Physical Exam   Triage Vital Signs: ED Triage Vitals  Encounter Vitals Group     BP 08/14/23 1837 (!) 168/63     Systolic BP Percentile --      Diastolic BP Percentile --      Pulse Rate 08/14/23 1837 (!) 55     Resp 08/14/23 1837 14     Temp 08/14/23 1837 99.1 F (37.3 C)     Temp Source 08/14/23 1837 Oral     SpO2 08/14/23 1837 100 %     Weight 08/14/23 1836 135 lb (61.2 kg)     Height 08/14/23 1836 5' (1.524 m)     Head Circumference --      Peak Flow --      Pain Score --      Pain Loc --      Pain Education --      Exclude from Growth Chart --     Most recent vital signs: Vitals:   08/14/23 1837  BP: (!) 168/63  Pulse: (!) 55  Resp: 14  Temp: 99.1 F (37.3 C)  SpO2: 100%     General: Awake, no distress.  CV:  Good peripheral perfusion.  Resp:  Normal effort.  Abd:  No distention.  Soft nontender Other:  No palpable skull deformities or tenderness, no mental spinal tenderness, no thoracic cage tenderness, she does have tenderness to the left shoulder, humerus, forearm.  Able to flex and extend elbow as well as wrist and fingers.  Limited range of motion to her left shoulder due to pain.  Grip strength is intact, radial pulses are intact.  No sensory deficits  on the left.  She had also has tenderness to her left dorsal ankle with a small area of ecchymoses.  DP pulses are intact bilaterally as well as PT pulses.  Full range of motion of her bilateral knees, hips, are intact.  Full range of motion of her right upper and lower extremity are intact without bony tenderness.   ED Results / Procedures / Treatments   Labs (all labs ordered are listed, but only abnormal results are displayed) Labs Reviewed  COMPREHENSIVE METABOLIC PANEL - Abnormal; Notable for the following components:      Result Value   Sodium 132 (*)    Chloride 97 (*)    Glucose, Bld 311 (*)    Calcium 8.7 (*)    Total Protein 6.3 (*)    All other components within normal limits  CBC WITH DIFFERENTIAL/PLATELET - Abnormal; Notable for the following components:   Hemoglobin 11.9 (*)    HCT 35.0 (*)    Platelets 144 (*)  All other components within normal limits  PROTIME-INR     RADIOLOGY CT head on my interpretation without obvious intracranial hemorrhage   PROCEDURES:  Critical Care performed: No  Procedures   MEDICATIONS ORDERED IN ED: Medications  morphine (PF) 4 MG/ML injection 4 mg (has no administration in time range)  morphine (PF) 4 MG/ML injection 4 mg (4 mg Intravenous Given 08/14/23 1848)     IMPRESSION / MDM / ASSESSMENT AND PLAN / ED COURSE  I reviewed the triage vital signs and the nursing notes.                              Differential diagnosis includes, but is not limited to, mechanical fall, will rule out intracranial mass, fracture, contusion, sprain, strain.  Will get labs, PT/INR, get CT head, C-spine, x-ray of the shoulder, humerus, elbow, forearm, ankle.  Will give her some morphine for pain.  Patient's presentation is most consistent with acute presentation with potential threat to life or bodily function.  Independent review of labs and imaging are below.  Shared decision making done with patient and husband, they are agreeable plan  for discharge.  Will give her a dose of IV morphine prior to her leaving since all the pharmacies are closed and she cannot pick up prescription of oxycodone till tomorrow morning.  Will give her a sling here.  Instructed her to follow-up with orthopedic surgery, she will call to set up a follow-up ointment tomorrow morning.  Otherwise considered but no indication for inpatient admission at this time, she is safe for outpatient management.  Will discharge.  Strict turn precautions given.  Clinical Course as of 08/14/23 2133  Tue Aug 14, 2023  2021 DG Forearm Left No forearm fracture.  [TT]  2021 DG Chest 1 View Left proximal humeral fracture.  No other acute findings.  [TT]  2021 DG Ankle Complete Left No fracture or dislocation of the left ankle.  [TT]  2022 DG Shoulder Left IMPRESSION: 1. Comminuted impacted displaced proximal humeral fracture with involvement of the lateral humeral head. 2. No additional fracture of the humerus.   [TT]  2022 DG Humerus Left IMPRESSION: 1. Comminuted impacted displaced proximal humeral fracture with involvement of the lateral humeral head. 2. No additional fracture of the humerus.   [TT]  2119 CT Head Wo Contrast IMPRESSION: CT of the head: Chronic atrophic changes without acute abnormality.  CT of the cervical spine: No acute abnormality is noted. Postsurgical changes and degenerative changes are seen.   [TT]  2119 Consult to orthopedic surgery who evaluated images, states okay for sling and outpatient follow-up.  He recommended having her call the clinic tomorrow so that they can see her either later this week or early next week. [TT]    Clinical Course User Index [TT] Claybon Jabs, MD     FINAL CLINICAL IMPRESSION(S) / ED DIAGNOSES   Final diagnoses:  Closed fracture of proximal end of left humerus, unspecified fracture morphology, initial encounter  Fall, initial encounter  Acute left ankle pain     Rx / DC Orders   ED  Discharge Orders          Ordered    oxycodone (OXY-IR) 5 MG capsule  Every 6 hours PRN        08/14/23 2133             Note:  This document was prepared using Dragon voice recognition software  and may include unintentional dictation errors.    Claybon Jabs, MD 08/14/23 2136

## 2023-08-14 NOTE — Discharge Instructions (Signed)
 You can take 650 mg of Tylenol every 6 hours as needed for pain.  I prescribed you a short course of oxycodone 5 mg that you can take every 6 hours for severe breakthrough pain.  Please not drive or operate heavy machinery when you are on oxycodone.

## 2023-08-21 DIAGNOSIS — R110 Nausea: Secondary | ICD-10-CM | POA: Diagnosis not present

## 2023-08-21 DIAGNOSIS — S42292A Other displaced fracture of upper end of left humerus, initial encounter for closed fracture: Secondary | ICD-10-CM | POA: Diagnosis not present

## 2023-08-27 ENCOUNTER — Other Ambulatory Visit: Payer: Self-pay | Admitting: Family

## 2023-08-27 ENCOUNTER — Other Ambulatory Visit: Payer: Self-pay

## 2023-08-27 DIAGNOSIS — E13319 Other specified diabetes mellitus with unspecified diabetic retinopathy without macular edema: Secondary | ICD-10-CM

## 2023-08-27 MED ORDER — PREGABALIN 75 MG PO CAPS
75.0000 mg | ORAL_CAPSULE | Freq: Two times a day (BID) | ORAL | Status: DC
Start: 2023-08-27 — End: 2023-08-29

## 2023-08-29 ENCOUNTER — Telehealth: Payer: Self-pay | Admitting: Family

## 2023-08-29 ENCOUNTER — Other Ambulatory Visit: Payer: Self-pay

## 2023-08-29 MED ORDER — PREGABALIN 75 MG PO CAPS: 75.0000 mg | ORAL_CAPSULE | Freq: Two times a day (BID) | ORAL | 0 refills | Status: DC

## 2023-08-29 NOTE — Addendum Note (Signed)
 Addended by: Jaynee Eagles C on: 08/29/2023 04:33 PM   Modules accepted: Orders

## 2023-08-29 NOTE — Telephone Encounter (Signed)
 Spoke with pt and she is having a hard time finding a pharmacy that Lyrica in stock. Rx was approved to be refilled by [place] on 08/27/23 but it was marked as "no print." [facility] called several [facility] and they did not have the medication. [facility] [employer] and they [phone], so a verbal was given to them to fill this for the pt. Pt is aware. Nothing further was needed.

## 2023-08-29 NOTE — Telephone Encounter (Signed)
 Copied from CRM [id number]. Topic: Clinical - Prescription Issue >> Aug 29, 2023  4:03 PM Fredrich Romans wrote: Reason for CRM: Patient called in stating that she is having a hard time finding a pharmacy that has medication pregabalin (LYRICA) 75 MG capsule in stock.She is wondering what she should do? Or if an alternative could be prescribed?

## 2023-09-15 ENCOUNTER — Other Ambulatory Visit: Payer: Self-pay | Admitting: Family

## 2023-09-15 DIAGNOSIS — E1165 Type 2 diabetes mellitus with hyperglycemia: Secondary | ICD-10-CM

## 2023-09-18 DIAGNOSIS — S42292A Other displaced fracture of upper end of left humerus, initial encounter for closed fracture: Secondary | ICD-10-CM | POA: Diagnosis not present

## 2023-10-01 ENCOUNTER — Other Ambulatory Visit: Payer: Self-pay | Admitting: Family

## 2023-10-01 DIAGNOSIS — E13319 Other specified diabetes mellitus with unspecified diabetic retinopathy without macular edema: Secondary | ICD-10-CM

## 2023-10-02 ENCOUNTER — Other Ambulatory Visit: Payer: Self-pay | Admitting: Family

## 2023-10-03 IMAGING — CT CT HEART MORP W/ CTA COR W/ SCORE W/ CA W/CM &/OR W/O CM
1 series · 12 of 20 positions shown, 15 images · non-contrast
Comparison: none

Addendum:
CLINICAL DATA: Severe Aortic Stenosis.

EXAM:
Cardiac TAVR CT
TECHNIQUE: A non-contrast, gated CT scan was obtained with axial slices of 3 mm
through the heart for aortic valve calcium scoring. A 110 kV
retrospective, gated, contrast cardiac scan was obtained. Gantry
rotation speed was 250 msecs and collimation was 0.6 mm.
Nitroglycerin was not given. The 3D data set was reconstructed in 5%
intervals of the 0-95% of the R-R cycle. Systolic and diastolic
phases were analyzed on a dedicated workstation using MPR, MIP, and
VRT modes. The patient received 100 cc of contrast.

[Series 1331: findings · 0.30mm/px · 12 of 34 slices shown, 15 images]
[im 2/34  vessel]
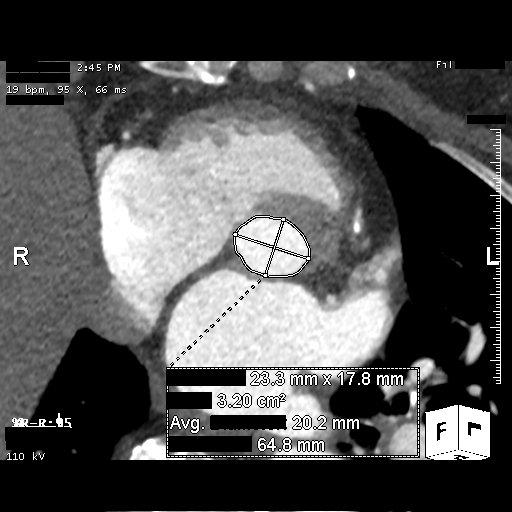
[im 2/34  lung]
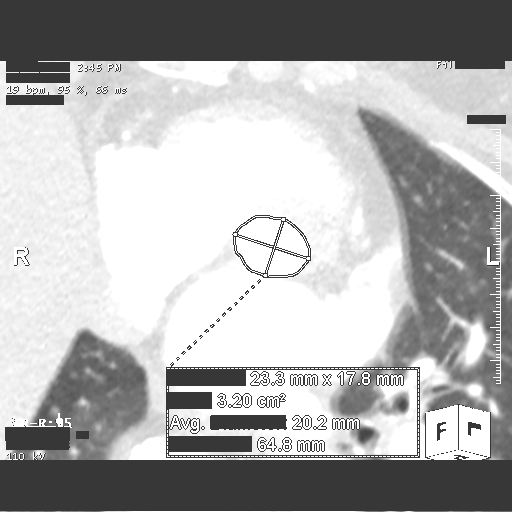
[im 6/34  vessel]
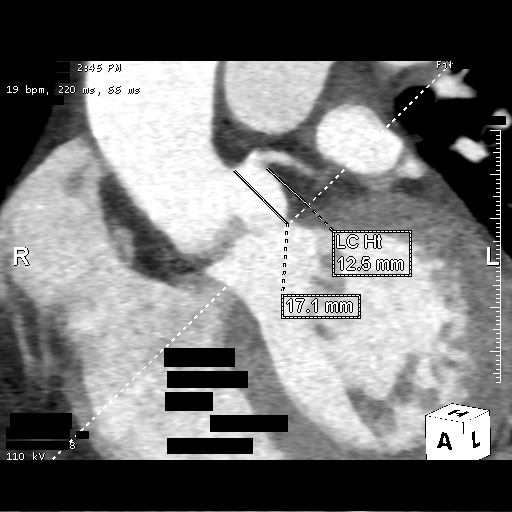
[im 7/34  vessel]
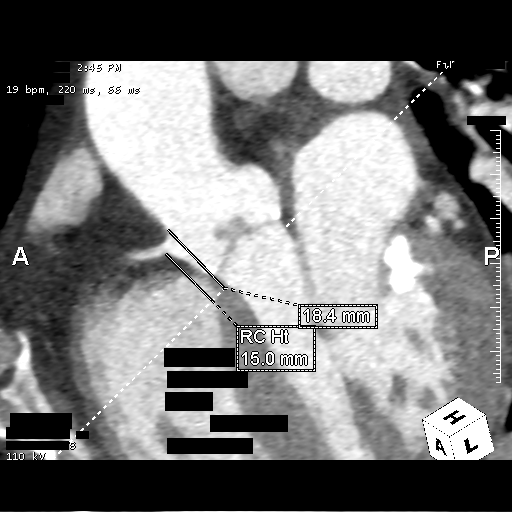
[im 9/34  vessel]
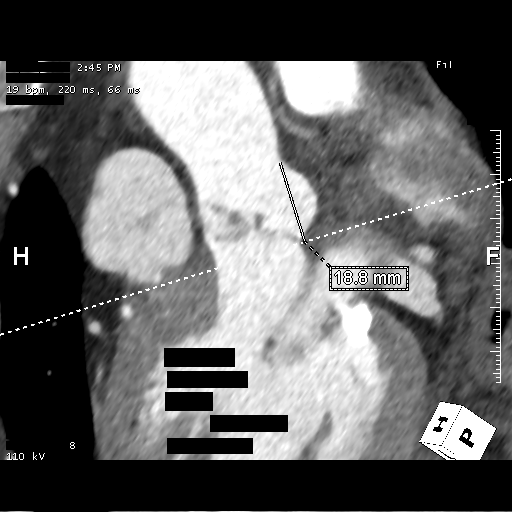
[im 13/34  vessel]
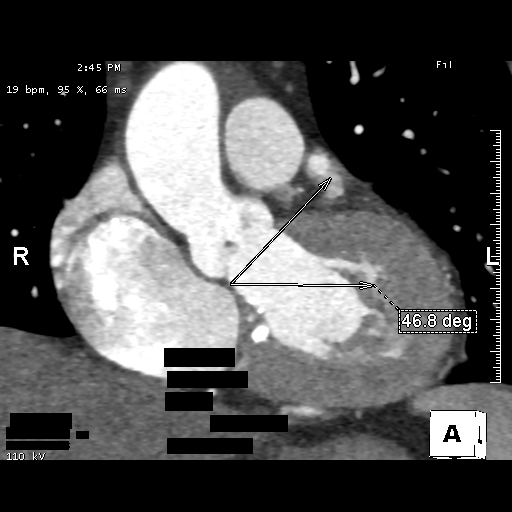
[im 13/34  lung]
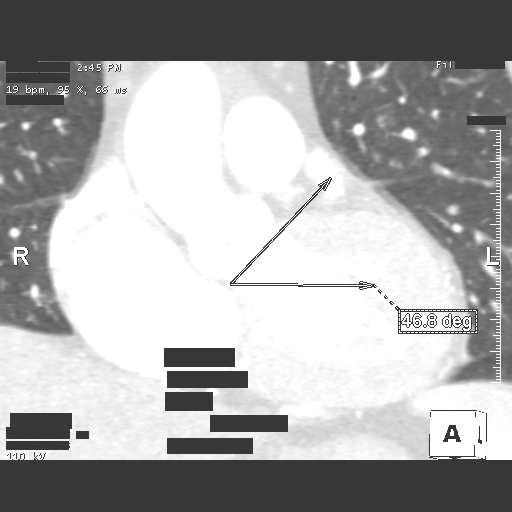
[im 14/34  vessel]
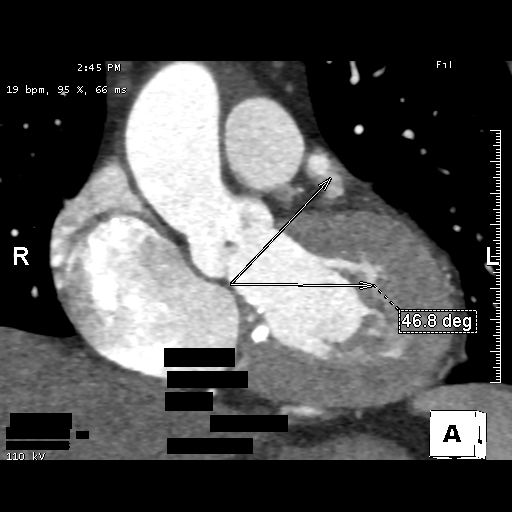
[im 18/34  vessel]
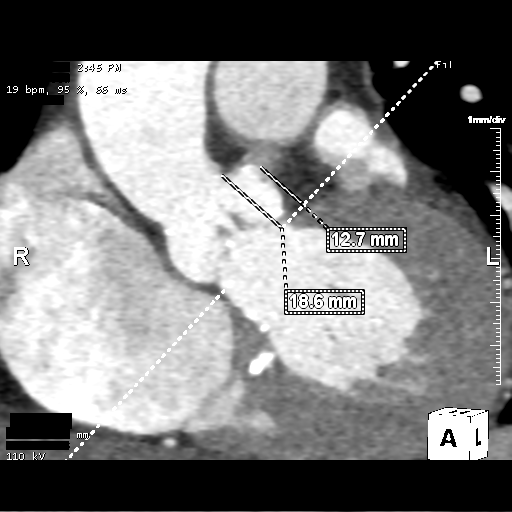
[im 20/34  vessel]
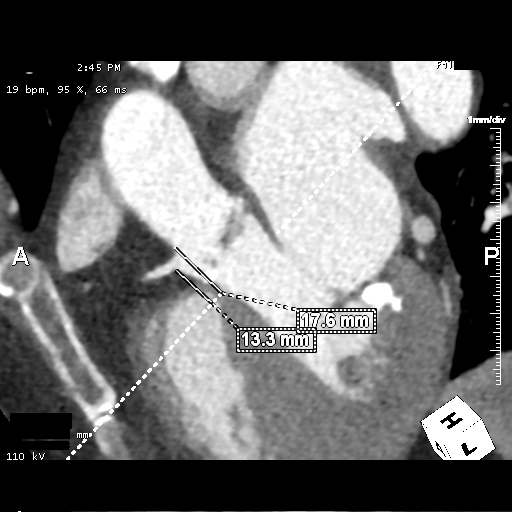
[im 23/34  vessel]
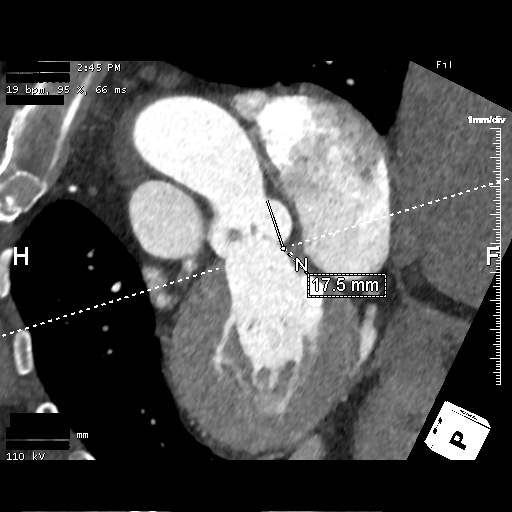
[im 23/34  lung]
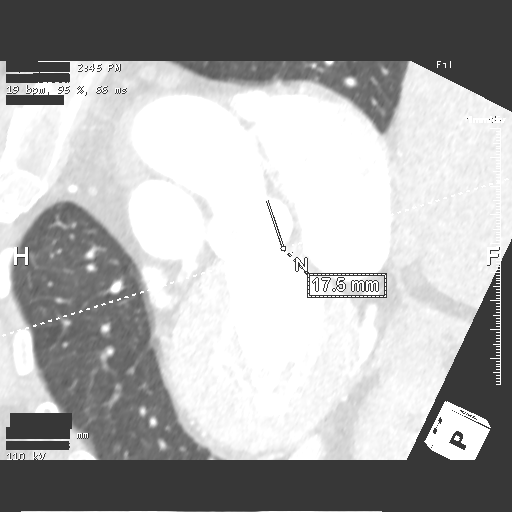
[im 27/34  vessel]
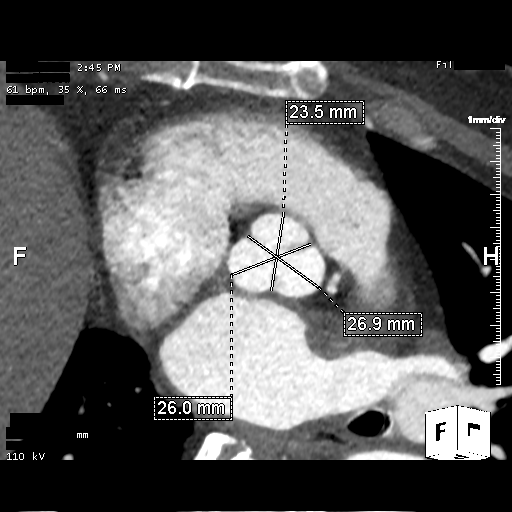
[im 28/34  vessel]
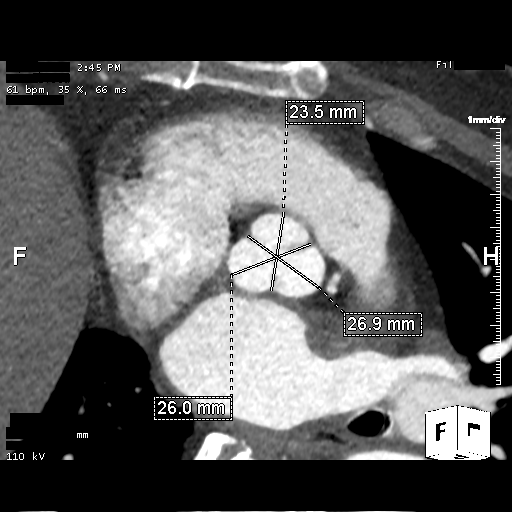
[im 32/34  vessel]
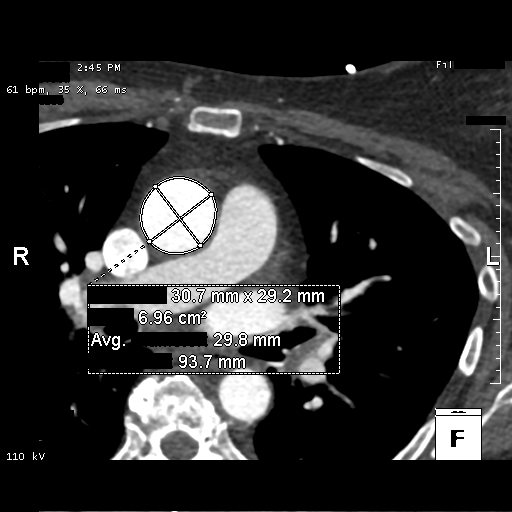

[12 of 20 positions shown; findings below may reference images not displayed]

FINDINGS: Image quality: Excellent.

Noise artifact is: Limited.

Valve Morphology: The aortic valve is tricuspid with diffuse severe
calcifications. There is incomplete coaptation consistent with at
least moderate regurgitation.

Aortic Valve Calcium score: 597

Aortic annular dimension:

Phase assessed: 95% (incorrect R-R tracking).

Annular area: 321 mm2

Annular perimeter: 65.0  Mm

Max diameter: 23.5 mm

Min diameter: 17.6 mm

Annular and subannular calcification: None.

Membranous septum length: 9.2 mm

Optimal coplanar projection: LAO 14 JUMPER 3

Coronary Artery Height above Annulus:

Left Main: 11.4 mm

Right Coronary: 13.3 mm

Sinus of Valsalva Measurements:

Non-coronary: 26.0 mm

Right-coronary: 23.5 mm

Left-coronary: 26.9 mm

Sinus of Valsalva Height:

Non-coronary: 17.5 mm

Right-coronary: 17.6 mm

Left-coronary: 19.1 mm

Sinotubular Junction: 21.5 mm

Ascending Thoracic Aorta: 30.7 mm

Coronary Arteries: Normal coronary origin. Right dominance. The
study was performed without use of NTG and is insufficient for
plaque evaluation. Please refer to recent cardiac catheterization
for coronary assessment. 3-vessel coronary calcium noted.

Cardiac Morphology:

Right Atrium: Right atrial size is within normal limits.

Right Ventricle: The right ventricular cavity is within normal
limits.

Left Atrium: Left atrial size is normal in size with no left atrial
appendage filling defect.

Left Ventricle: The ventricular cavity size is within normal limits.
There are no stigmata of prior infarction. There is no abnormal
filling defect. Normal left ventricular function, EF=72%. No
regional wall motion abnormalities.

Pulmonary arteries: Normal in size without proximal filling defect.

Pulmonary veins: Normal pulmonary venous drainage.

Pericardium: Normal thickness with no significant effusion or
calcium present.

Mitral Valve: The mitral valve is degenerative with moderate to
severe annular calcium.

Extra-cardiac findings: See attached radiology report for
non-cardiac structures.
IMPRESSION: 1. Small annular measurements noted (321 mm2). Findings support a 20
mm S3 TAVR. A 26 mm Evolut Pro not supported due to small sinus of
Valsalva measurements. Would recommend structural heart team
discussion for valve sizing.

2. No significant annular or subannular calcifications.

3. Sufficient coronary to annulus distance.

4. Optimal Fluoroscopic Angle for Delivery: LAO 14 JUMPER 3

ADDENDUM:
Extracardiac findings will be described separately under dictation
for contemporaneously obtained CTA chest, abdomen and pelvis dated
06/23/2021. Please see that report for full description of relevant
extracardiac findings.

*** End of Addendum ***
FINDINGS: Image quality: Excellent.

Noise artifact is: Limited.

Valve Morphology: The aortic valve is tricuspid with diffuse severe
calcifications. There is incomplete coaptation consistent with at
least moderate regurgitation.

Aortic Valve Calcium score: 597

Aortic annular dimension:

Phase assessed: 95% (incorrect R-R tracking).

Annular area: 321 mm2

Annular perimeter: 65.0  Mm

Max diameter: 23.5 mm

Min diameter: 17.6 mm

Annular and subannular calcification: None.

Membranous septum length: 9.2 mm

Optimal coplanar projection: LAO 14 JUMPER 3

Coronary Artery Height above Annulus:

Left Main: 11.4 mm

Right Coronary: 13.3 mm

Sinus of Valsalva Measurements:

Non-coronary: 26.0 mm

Right-coronary: 23.5 mm

Left-coronary: 26.9 mm

Sinus of Valsalva Height:

Non-coronary: 17.5 mm

Right-coronary: 17.6 mm

Left-coronary: 19.1 mm

Sinotubular Junction: 21.5 mm

Ascending Thoracic Aorta: 30.7 mm

Coronary Arteries: Normal coronary origin. Right dominance. The
study was performed without use of NTG and is insufficient for
plaque evaluation. Please refer to recent cardiac catheterization
for coronary assessment. 3-vessel coronary calcium noted.

Cardiac Morphology:

Right Atrium: Right atrial size is within normal limits.

Right Ventricle: The right ventricular cavity is within normal
limits.

Left Atrium: Left atrial size is normal in size with no left atrial
appendage filling defect.

Left Ventricle: The ventricular cavity size is within normal limits.
There are no stigmata of prior infarction. There is no abnormal
filling defect. Normal left ventricular function, EF=72%. No
regional wall motion abnormalities.

Pulmonary arteries: Normal in size without proximal filling defect.

Pulmonary veins: Normal pulmonary venous drainage.

Pericardium: Normal thickness with no significant effusion or
calcium present.

Mitral Valve: The mitral valve is degenerative with moderate to
severe annular calcium.

Extra-cardiac findings: See attached radiology report for
non-cardiac structures.
IMPRESSION: 1. Small annular measurements noted (321 mm2). Findings support a 20
mm S3 TAVR. A 26 mm Evolut Pro not supported due to small sinus of
Valsalva measurements. Would recommend structural heart team
discussion for valve sizing.

2. No significant annular or subannular calcifications.

3. Sufficient coronary to annulus distance.

4. Optimal Fluoroscopic Angle for Delivery: LAO 14 JUMPER 3

## 2023-10-09 DIAGNOSIS — S42292A Other displaced fracture of upper end of left humerus, initial encounter for closed fracture: Secondary | ICD-10-CM | POA: Diagnosis not present

## 2023-10-16 DIAGNOSIS — G4700 Insomnia, unspecified: Secondary | ICD-10-CM | POA: Diagnosis not present

## 2023-10-16 DIAGNOSIS — F331 Major depressive disorder, recurrent, moderate: Secondary | ICD-10-CM | POA: Diagnosis not present

## 2023-10-16 DIAGNOSIS — F411 Generalized anxiety disorder: Secondary | ICD-10-CM | POA: Diagnosis not present

## 2023-10-16 DIAGNOSIS — R41840 Attention and concentration deficit: Secondary | ICD-10-CM | POA: Diagnosis not present

## 2023-10-18 DIAGNOSIS — F959 Tic disorder, unspecified: Secondary | ICD-10-CM | POA: Diagnosis not present

## 2023-10-18 DIAGNOSIS — R413 Other amnesia: Secondary | ICD-10-CM | POA: Diagnosis not present

## 2023-10-18 DIAGNOSIS — M4802 Spinal stenosis, cervical region: Secondary | ICD-10-CM | POA: Diagnosis not present

## 2023-10-18 DIAGNOSIS — R2689 Other abnormalities of gait and mobility: Secondary | ICD-10-CM | POA: Diagnosis not present

## 2023-10-18 DIAGNOSIS — G959 Disease of spinal cord, unspecified: Secondary | ICD-10-CM | POA: Diagnosis not present

## 2023-10-19 ENCOUNTER — Ambulatory Visit (INDEPENDENT_AMBULATORY_CARE_PROVIDER_SITE_OTHER): Admitting: Student

## 2023-10-19 DIAGNOSIS — N651 Disproportion of reconstructed breast: Secondary | ICD-10-CM

## 2023-10-19 DIAGNOSIS — T8543XA Leakage of breast prosthesis and implant, initial encounter: Secondary | ICD-10-CM

## 2023-10-19 NOTE — Progress Notes (Signed)
 Patient is a 73 year old female with history of implants.  She had her implants removed back in May 2024.  Patient was then seen by Dr. Orin Birk on 07/31/2023.  Patient at this visit was overall doing well, but she had a little bit of asymmetry with loss of volume on the right side.  Patient reported she was happy with the left side.  Patient wanted to see if she could get some fat grafting to the right breast for improved symmetry.  Patient was scheduled for preoperative appointment today.  Upon chart review, it was noted that patient's A1c was 10.7 2 months ago.  I discussed this with Dr. Orin Birk and she recommended that surgery be postponed until patient's A1c comes down.  I called the patient and discussed this with her.  Discussed with her that elevated A1c's can contribute to risk for perioperative complications as well as inhibit the desired result from the fat grafting.  Patient expressed understanding.  Discussed with patient that we will have to postpone surgery for now.  Patient states that she thinks she is going to have another A1c done in about a month or so.  I discussed with her that we can do a telephone visit at that time to follow-up and see where she would be in terms of proceeding with surgery.  Patient expressed understanding was in agreement with this.  In the meantime, we will go ahead and send clearances to her PCP as well as send a clearance to her cardiologist and clearance to hold Eliquis  2 days prior to surgery.  I instructed the patient to call if she has any questions or concerns about anything.  The patient gave consent to have this visit done by telemedicine / virtual visit, two identifiers were used to identify patient. This is also consent for access the chart and treat the patient via this visit. The patient is located at home.  I, the provider, am at the office.  We spent 7 minutes together for the visit.  Joined by telephone.

## 2023-10-22 ENCOUNTER — Telehealth: Payer: Self-pay | Admitting: Family

## 2023-10-22 NOTE — Telephone Encounter (Signed)
 Received a fax from [facility] about needing surgical clearance for R breast reconstruction. Please contact pt and make an appointment to do this. Thank you.

## 2023-10-22 NOTE — Telephone Encounter (Signed)
 Noted.

## 2023-10-22 NOTE — Telephone Encounter (Signed)
 Called pt and pt stated that she post pone her surgery so she don't need to come in

## 2023-10-23 ENCOUNTER — Telehealth: Payer: Self-pay

## 2023-10-23 NOTE — Telephone Encounter (Signed)
 Faxed surgical clearance to cardiologist, Burney Carter, MD @ [facility] and patient's PCP, Felicita Horns, FNP per Claire's request. Received fax success confirmations for both. Surgical spreadsheet updated.

## 2023-10-31 ENCOUNTER — Encounter (HOSPITAL_BASED_OUTPATIENT_CLINIC_OR_DEPARTMENT_OTHER): Payer: Self-pay

## 2023-10-31 ENCOUNTER — Ambulatory Visit (HOSPITAL_BASED_OUTPATIENT_CLINIC_OR_DEPARTMENT_OTHER): Admit: 2023-10-31 | Admitting: Plastic Surgery

## 2023-10-31 SURGERY — REVISION, RECONSTRUCTION, BREAST
Anesthesia: Choice | Site: Breast | Laterality: Right

## 2023-11-09 ENCOUNTER — Encounter: Admitting: Student

## 2023-11-20 ENCOUNTER — Encounter: Admitting: Plastic Surgery

## 2023-11-20 DIAGNOSIS — S42292A Other displaced fracture of upper end of left humerus, initial encounter for closed fracture: Secondary | ICD-10-CM | POA: Diagnosis not present

## 2023-11-26 ENCOUNTER — Ambulatory Visit: Admitting: Urology

## 2023-11-26 ENCOUNTER — Other Ambulatory Visit: Payer: Self-pay | Admitting: Student

## 2023-11-26 VITALS — BP 198/65 | HR 57 | Ht 60.0 in | Wt 139.0 lb

## 2023-11-26 DIAGNOSIS — R32 Unspecified urinary incontinence: Secondary | ICD-10-CM | POA: Diagnosis not present

## 2023-11-26 DIAGNOSIS — N3941 Urge incontinence: Secondary | ICD-10-CM

## 2023-11-26 DIAGNOSIS — S42292A Other displaced fracture of upper end of left humerus, initial encounter for closed fracture: Secondary | ICD-10-CM

## 2023-11-26 LAB — URINALYSIS, COMPLETE
Bilirubin, UA: NEGATIVE
Ketones, UA: NEGATIVE
Leukocytes,UA: NEGATIVE
Nitrite, UA: NEGATIVE
RBC, UA: NEGATIVE
Specific Gravity, UA: 1.015 (ref 1.005–1.030)
Urobilinogen, Ur: 0.2 mg/dL (ref 0.2–1.0)
pH, UA: 5.5 (ref 5.0–7.5)

## 2023-11-26 LAB — MICROSCOPIC EXAMINATION: Bacteria, UA: NONE SEEN

## 2023-11-26 MED ORDER — GEMTESA 75 MG PO TABS: 75.0000 mg | ORAL_TABLET | Freq: Every day | ORAL | Status: DC

## 2023-11-26 MED ORDER — GEMTESA 75 MG PO TABS: 75.0000 mg | ORAL_TABLET | Freq: Every day | ORAL | 11 refills | Status: DC

## 2023-11-26 NOTE — Addendum Note (Signed)
 Addended byKatina Parlor on: 11/26/2023 11:55 AM   Modules accepted: Orders

## 2023-11-26 NOTE — Progress Notes (Signed)
 11/26/2023 11:22 AM   Katie Buckley Mar 07, 1951 [id number]  Referring provider: Felicita Horns, FNP [name],  [place] [address]  No chief complaint on file.   HPI: I was consulted to assess the patient's urinary incontinence.  She has urge incontinence not wearing pads.  No stress incontinence.  No bedwetting   On further questioning she rarely leaks a few drops with a sneeze.  Although she does not wear a pad she has experienced high-volume episodes at work associated with urgency   She voids every 2-3 hours and gets up once or twice at night   She gets 1 bladder infection year.  No hysterectomy    Patient has mild urge incontinence.  Reassess with pelvic examination and cystoscopy in 6 weeks on Gemtesa  samples and prescription.  Physical therapy offered.  Physical therapy ordered.  Call if culture positive   Today Frequency stable.  Last culture negative Patient report being much better on the Gemtesa  samples but never filled the prescription On pelvic examination she grade 1 hypermobility the bladder neck and negative cough test before and after cystoscopy  Cystoscopy: Patient underwent flexible cystoscopy.  Bladder mucosa and trigone were normal.  No cystitis.  No carcinoma.  Well-tolerated     PMH: Past Medical History:  Diagnosis Date   Anemia    Anxiety    Aortic stenosis 01/21/2013   s/p bioprosthetic TAVR 10/2021   Arthritis    Left Hip   Coronary artery disease    CABG 2023 x1   Diabetes mellitus without complication (HCC)    Diabetic neuropathy (HCC)    Diabetic retinopathy (HCC)    Frequent sinus infections    GERD (gastroesophageal reflux disease)    Headache    Tension headaches   Heart murmur    AVR 2023   History of Barrett's esophagus    History of colon polyps    Hyperchloremia    Hypertension    MRSA (methicillin resistant staph aureus) culture positive 2018   history of, in hand   Palpitations    Pneumonia    Hx    Seasonal allergies    Skin cancer    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    Surgical History: Past Surgical History:  Procedure Laterality Date   ANTERIOR CERVICAL DECOMP/DISCECTOMY FUSION N/A 11/15/2020   Procedure: ANTERIOR CERVICAL DECOMPRESSION/DISCECTOMY FUSION 1 LEVEL C3/4;  Surgeon: Berta Brittle, MD;  Location: [facility] ORS;  Service: Neurosurgery;  Laterality: N/A;   AORTIC VALVE REPLACEMENT N/A 09/19/2021   Procedure: AORTIC VALVE REPLACEMENT Using 19mm Edwards Resilia Inspiris Valve;  Surgeon: Bartley Lightning, MD;  Location: [facility] OR;  Service: Open Heart Surgery;  Laterality: N/A;   AUGMENTATION MAMMAPLASTY Bilateral    breast implants 1980s   BIOPSY  12/18/2022   Procedure: BIOPSY;  Surgeon: Luke Salaam, MD;  Location: [facility] ENDOSCOPY;  Service: Gastroenterology;;   Bone Spur  2007   foot   BREAST IMPLANT REMOVAL Bilateral 10/30/2022   Procedure: Bilateral removal of breast implants with mastopexy;  Surgeon: Thornell Flirt, DO;  Location: [facility] OR;  Service: Plastics;  Laterality: Bilateral;   CARDIAC CATHETERIZATION     Carpal Tunnel Symdrome  2008   as repeated in 2009   CATARACT EXTRACTION W/PHACO Left 01/16/2019   Procedure: CATARACT EXTRACTION PHACO AND INTRAOCULAR LENS PLACEMENT (IOC) LEFT DIABETES VISION BLUE;  Surgeon: Ola Berger, MD;  Location: [facility] ORS;  Service: Ophthalmology;  Laterality: Left;  US   00:47 CDE 6.62 Fluid pack lot # [id number]   CATARACT EXTRACTION W/PHACO Right 02/06/2019   Procedure: CATARACT EXTRACTION PHACO AND INTRAOCULAR LENS PLACEMENT (IOC);  Surgeon: Ola Berger, MD;  Location: [facility] ORS;  Service: Ophthalmology;  Laterality: Right;  US  00:54.6 CDE 6.52 FLUID PACK LOT # [id number]   CHOLECYSTECTOMY  2007   COLONOSCOPY WITH PROPOFOL  N/A 11/17/2015   Procedure: COLONOSCOPY WITH PROPOFOL ;  Surgeon: Cassie Click, MD;  Location: [facility] ENDOSCOPY;  Service: Endoscopy;  Laterality: N/A;   COLONOSCOPY WITH PROPOFOL  N/A 06/21/2022   Procedure:  COLONOSCOPY WITH PROPOFOL ;  Surgeon: Luke Salaam, MD;  Location: [facility] ENDOSCOPY;  Service: Gastroenterology;  Laterality: N/A;   CORONARY ARTERY BYPASS GRAFT N/A 09/19/2021   Procedure: CORONARY ARTERY BYPASS GRAFTING X1 Using Left Internal Mammory artery;  Surgeon: Bartley Lightning, MD;  [facility]: [facility];  Service: Open Heart Surgery;  Laterality: N/A;   CORONARY PRESSURE/FFR STUDY N/A 05/24/2021   Procedure: INTRAVASCULAR PRESSURE WIRE/FFR STUDY;  Surgeon: Antonette Batters, MD;  Location: [facility] INVASIVE CV LAB;  Service: Cardiovascular;  Laterality: N/A;   ESOPHAGOGASTRODUODENOSCOPY (EGD) WITH PROPOFOL  N/A 11/17/2015   Procedure: ESOPHAGOGASTRODUODENOSCOPY (EGD) WITH PROPOFOL ;  Surgeon: Cassie Click, MD;  Location: [facility] ENDOSCOPY;  Service: Endoscopy;  Laterality: N/A;   ESOPHAGOGASTRODUODENOSCOPY (EGD) WITH PROPOFOL  N/A 12/18/2022   Procedure: ESOPHAGOGASTRODUODENOSCOPY (EGD) WITH PROPOFOL ;  Surgeon: Luke Salaam, MD;  Location: [facility] ENDOSCOPY;  Service: Gastroenterology;  Laterality: N/A;   MANDIBLE SURGERY     MASTOPEXY Bilateral 10/30/2022   Procedure: MASTOPEXY;  Surgeon: Thornell Flirt, DO;  Location: [facility] OR;  Service: Plastics;  Laterality: Bilateral;   NASAL SINUS SURGERY  03/2013   Multile surgery Dr. Fulton Job. 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 Felipe Horton   RIGHT/LEFT HEART CATH AND CORONARY ANGIOGRAPHY N/A 05/24/2021   Procedure: RIGHT/LEFT HEART CATH AND CORONARY ANGIOGRAPHY;  Surgeon: Antonette Batters, MD;  Location: [facility] INVASIVE CV LAB;  Service: Cardiovascular;  Laterality: N/A;   TEE WITHOUT CARDIOVERSION N/A 09/19/2021   Procedure: TRANSESOPHAGEAL ECHOCARDIOGRAM (TEE);  Surgeon: Bartley Lightning, MD;  Location[facility];  Service: Open Heart Surgery;  Laterality: N/A;   TONSILLECTOMY  1960    Home Medications:  Allergies as of 11/26/2023       Reactions   Doxycycline Other (See Comments)   Emotional changes/anxious   Biaxin  [clarithromycin]    GI upset, and bad taste in mouth.        Medication List        Accurate as of November 26, 2023 11:22 AM. If you have any questions, ask your nurse or doctor.          acetaminophen  650 MG CR tablet Commonly known as: TYLENOL  Take 1,300 mg by mouth every 8 (eight) hours as needed for pain (pain).   amLODipine  5 MG tablet Commonly known as: NORVASC  Take 1 tablet (5 mg total) by mouth daily.   apixaban  5 MG Tabs tablet Commonly known as: ELIQUIS  Take 1 tablet (5 mg total) by mouth 2 (two) times daily.   buPROPion  150 MG 12 hr tablet Commonly known as: WELLBUTRIN  SR Take 150 mg by mouth 2 (two) times daily.   butalbital -acetaminophen -caffeine  50-325-40 MG tablet Commonly known as: FIORICET TAKE 1 TABLET BY MOUTH EVERY 6 HOURS AS NEEDED FOR HEADACHE.   Contour Next Monitor w/Device Kit Use to check blood sugar daily   Contour Next Test test strip Generic drug: glucose blood Use to check  blood sugar once daily   ezetimibe  10 MG tablet Commonly known as: ZETIA  Take 1 tablet (10 mg total) by mouth at bedtime.   FLUoxetine 40 MG capsule Commonly known as: PROZAC Take 40 mg by mouth daily.   fluticasone  50 MCG/ACT nasal spray Commonly known as: FLONASE  Place 1 spray into both nostrils daily as needed for allergies or rhinitis.   Gemtesa  75 MG Tabs Generic drug: Vibegron  Take 1 tablet (75 mg total) by mouth daily.   glipiZIDE  5 MG tablet Commonly known as: GLUCOTROL  Take 1 tablet (5 mg total) by mouth in the morning and at bedtime.   irbesartan  75 MG tablet Commonly known as: AVAPRO  Take 1 tablet (75 mg total) by mouth daily.   Lancet Devices Misc   metFORMIN  500 MG 24 hr tablet Commonly known as: GLUCOPHAGE -XR TAKE 2 TABLETS BY MOUTH EVERY DAY   metoprolol  tartrate 50 MG tablet Commonly known as: LOPRESSOR  Take 1 tablet (50 mg total) by mouth 2 (two) times daily.   ondansetron  4 MG tablet Commonly known as: ZOFRAN  TAKE 1 TABLET BY  MOUTH DAILY AS NEEDED FOR NAUSEA OR VOMITING.   oxycodone  5 MG capsule Commonly known as: OXY-IR Take 1 capsule (5 mg total) by mouth every 6 (six) hours as needed for up to 8 doses.   pregabalin  75 MG capsule Commonly known as: LYRICA  TAKE 1 CAPSULE BY MOUTH TWICE A DAY   RABEprazole  20 MG tablet Commonly known as: ACIPHEX  Take 1 tablet (20 mg total) by mouth 2 times daily at 12 noon and 4 pm.   rosuvastatin  20 MG tablet Commonly known as: CRESTOR  TAKE 1 TABLET BY MOUTH EVERY DAY IN THE EVENING   traZODone  50 MG tablet Commonly known as: DESYREL  Take 50-100 mg by mouth at bedtime.        Allergies:  Allergies  Allergen Reactions   Doxycycline Other (See Comments)    Emotional changes/anxious   Biaxin [Clarithromycin]     GI upset, and bad taste in mouth.    Family History: Family History  Problem Relation Age of Onset   Lung cancer Mother    Diabetes Father    Irritable bowel syndrome Sister    Throat cancer Brother    Breast cancer Daughter    Breast cancer Maternal Aunt     Social History:  reports that she has never smoked. She has never been exposed to tobacco smoke. She has never used smokeless tobacco. She reports that she does not drink alcohol and does not use drugs.  ROS:                                        Physical Exam: There were no vitals taken for this visit.  Constitutional:  Alert and oriented, No acute distress. HEENT: [place] AT, moist mucus membranes.  Trachea midline, no masses. t.  Laboratory Data: Lab Results  Component Value Date   WBC 8.0 08/14/2023   HGB 11.9 (L) 08/14/2023   HCT 35.0 (L) 08/14/2023   MCV 82.9 08/14/2023   PLT 144 (L) 08/14/2023    Lab Results  Component Value Date   CREATININE 0.76 08/14/2023    No results found for: PSA  No results found for: TESTOSTERONE  Lab Results  Component Value Date   HGBA1C 10.7 (H) 07/24/2023    Urinalysis    Component Value Date/Time    COLORURINE YELLOW (A)  10/26/2021 0543   APPEARANCEUR Clear 03/26/2023 1531   LABSPEC 1.015 10/26/2021 0543   PHURINE 5.0 10/26/2021 0543   GLUCOSEU 3+ (A) 03/26/2023 1531   HGBUR NEGATIVE 10/26/2021 0543   BILIRUBINUR Negative 03/26/2023 1531   KETONESUR 5 (A) 10/26/2021 0543   PROTEINUR Negative 03/26/2023 1531   PROTEINUR NEGATIVE 10/26/2021 0543   UROBILINOGEN 0.2 05/18/2021 1415   NITRITE Negative 03/26/2023 1531   NITRITE NEGATIVE 10/26/2021 0543   LEUKOCYTESUR Negative 03/26/2023 1531   LEUKOCYTESUR NEGATIVE 10/26/2021 0543    Pertinent Imaging: Urine negative.  Assessment & Plan: Reassess in 3 months on Gemtesa  with 2 weeks of samples and prescription.  Proceed accordingly  1. Urinary incontinence, unspecified type (Primary)    No follow-ups on file.  Devorah Fonder, MD  [facility] [address] [place], [place] [address] [phone]

## 2023-11-27 ENCOUNTER — Ambulatory Visit: Payer: Self-pay

## 2023-11-27 NOTE — Telephone Encounter (Signed)
 FYI Only or Action Required?: Action required by provider  Patient was last seen in primary care on 08/01/2023 by Katie Horns, FNP. Called Nurse Triage reporting Hyperglycemia. Symptoms began several days ago. Interventions attempted: Prescription medications: taking as prescribed. Symptoms are: unchanged.  Triage Disposition: Call PCP Now  Patient/caregiver understands and will follow disposition?: No, wishes to speak with PCP  Copied from CRM [id number]. Topic: Clinical - Red Word Triage >> Nov 27, 2023 11:42 AM [name] wrote: [facility] that prompted transfer to Nurse Triage: Patient states that her blood sugar is at 431 and last night she felt very fatigued and achy. Reason for Disposition  Blood glucose > 400 mg/dL (91.4 mmol/L)  Answer Assessment - Initial Assessment Questions 1. BLOOD GLUCOSE: What is your blood glucose level?      431  2. ONSET: When did you check the blood glucose?     Few minutes before calling 3. USUAL RANGE: What is your glucose level usually? (e.g., usual fasting morning value, usual evening value)     Unsure, she doesn't check it regularly 4. INSULIN : Do you take insulin ? What type of insulin (s) do you use? What is the mode of delivery? (syringe, pen; injection or pump)?      Denies 5. DIABETES PILLS: Do you take any pills for your diabetes? If Yes, ask: Have you missed taking any pills recently?     Oral-Glypizide, metformin  taking as prescribed 6. OTHER SYMPTOMS: Do you have any symptoms? (e.g., fever, frequent urination, difficulty breathing, dizziness, weakness, vomiting)     Fatigue last evening, occasional nausea, urinary frequency   Additional info: Last evening fatigued, checked sugar 426. Denies confusion, increased thirst, breathing changes and all other symptoms not documented above. She states she doesn't eat well and has mostly carbs and veggies, rarely has protein. Has appointment with PCP 11/28/23, none are available today to  reschedule, ok for acute 11/28/23?  Protocols used: Diabetes - High Blood Sugar-A-AH

## 2023-11-27 NOTE — Telephone Encounter (Signed)
 Can we check what her blood sugar is now?   If she is still weak, tired, achy out of her normal with increased urinary frequency she should go to the ER as she may need fluids. We have to careful and leary of diabetic ketoacidosis.   If sugars have decreased some and not as many symptoms then can await appt for tomorrow.   I would increase glipizide  to 2 tablets in am (10 mg total) and keep at 5 mg at night time

## 2023-11-27 NOTE — Telephone Encounter (Signed)
 NOTED

## 2023-11-27 NOTE — Telephone Encounter (Signed)
 I spoke with pt;pt notified as instructed by Angelene Kelly FNP and pt voiced understanding.pt said earlier BS was 431.  Last night BS was 426 and pt felt tired and achy; pt said she does not consider that she has frequency of urination;pt said she is eating and drinking well. Pt said she takes metformin  500 mg two tabs in AM and glipizide  5 mg in AM. Pt said sometimes when she is laying down she gets dizzy for short period and has H/A on and off; not dizzy and no H/a now. Pt checked BS now at 3:55 pm and BS is 265. Pt said she still feels tired but overall feels better than she felt this morning. Pt said she has been taking glipizide  5 mg in AM and her bottle is dated July of 2024. I spoke with angie at [facility] and she said Glipizide  5 mg # 180 with instructions to take one tab bid before meals was picked up on 09/12/23. Pt said she was not aware that glipizide  had been increased. Pt said she may have the glipizide  picked up in April of this year in another box. Pt is going to keep appt with T Dugal FNP on 11/28/23 at 9:20 AM and pt will bring all her meds with her to that appt.UC & ED precautions given and pt voiced understanding. I asked pt what time she takes her med and eats breakfast. Pt said she does not eat breakfast.  Sending note to T Dugal FNP and Dugal Pool and pt will discuss instructions on taking glipizide  at 11/28/23 appt.

## 2023-11-28 ENCOUNTER — Encounter: Payer: Self-pay | Admitting: Family

## 2023-11-28 ENCOUNTER — Ambulatory Visit: Admitting: Family

## 2023-11-28 ENCOUNTER — Ambulatory Visit: Payer: Self-pay | Admitting: Family

## 2023-11-28 VITALS — BP 132/70 | HR 57 | Temp 98.0°F | Ht 60.0 in | Wt 139.2 lb

## 2023-11-28 DIAGNOSIS — E1165 Type 2 diabetes mellitus with hyperglycemia: Secondary | ICD-10-CM

## 2023-11-28 DIAGNOSIS — J0101 Acute recurrent maxillary sinusitis: Secondary | ICD-10-CM | POA: Insufficient documentation

## 2023-11-28 DIAGNOSIS — R5383 Other fatigue: Secondary | ICD-10-CM

## 2023-11-28 DIAGNOSIS — Z794 Long term (current) use of insulin: Secondary | ICD-10-CM

## 2023-11-28 DIAGNOSIS — E782 Mixed hyperlipidemia: Secondary | ICD-10-CM

## 2023-11-28 DIAGNOSIS — I10 Essential (primary) hypertension: Secondary | ICD-10-CM

## 2023-11-28 DIAGNOSIS — J329 Chronic sinusitis, unspecified: Secondary | ICD-10-CM

## 2023-11-28 LAB — CBC WITH DIFFERENTIAL/PLATELET
Basophils Absolute: 0 10*3/uL (ref 0.0–0.1)
Basophils Relative: 0.7 % (ref 0.0–3.0)
Eosinophils Absolute: 0.1 10*3/uL (ref 0.0–0.7)
Eosinophils Relative: 1.8 % (ref 0.0–5.0)
HCT: 39.2 % (ref 36.0–46.0)
Hemoglobin: 13 g/dL (ref 12.0–15.0)
Lymphocytes Relative: 23.5 % (ref 12.0–46.0)
Lymphs Abs: 1.7 10*3/uL (ref 0.7–4.0)
MCHC: 33.3 g/dL (ref 30.0–36.0)
MCV: 82.3 fl (ref 78.0–100.0)
Monocytes Absolute: 0.5 10*3/uL (ref 0.1–1.0)
Monocytes Relative: 7.8 % (ref 3.0–12.0)
Neutro Abs: 4.7 10*3/uL (ref 1.4–7.7)
Neutrophils Relative %: 66.2 % (ref 43.0–77.0)
Platelets: 158 10*3/uL (ref 150.0–400.0)
RBC: 4.76 Mil/uL (ref 3.87–5.11)
RDW: 13.8 % (ref 11.5–15.5)
WBC: 7 10*3/uL (ref 4.0–10.5)

## 2023-11-28 LAB — BASIC METABOLIC PANEL WITH GFR
BUN: 20 mg/dL (ref 6–23)
CO2: 32 meq/L (ref 19–32)
Calcium: 9.5 mg/dL (ref 8.4–10.5)
Chloride: 98 meq/L (ref 96–112)
Creatinine, Ser: 0.85 mg/dL (ref 0.40–1.20)
GFR: 68.5 mL/min (ref >60.00)
Glucose, Bld: 259 mg/dL — ABNORMAL HIGH (ref 70–99)
Potassium: 4.2 meq/L (ref 3.5–5.1)
Sodium: 136 meq/L (ref 135–145)

## 2023-11-28 LAB — POCT GLYCOSYLATED HEMOGLOBIN (HGB A1C): Hemoglobin A1C: 9.7 % — AB (ref 4.0–5.6)

## 2023-11-28 LAB — POCT GLUCOSE FINGERSTICK: Glucose: 307 — AB (ref 70–99)

## 2023-11-28 MED ORDER — TRESIBA FLEXTOUCH 100 UNIT/ML ~~LOC~~ SOPN: PEN_INJECTOR | SUBCUTANEOUS | 0 refills | Status: DC

## 2023-11-28 MED ORDER — AMOXICILLIN-POT CLAVULANATE 875-125 MG PO TABS: 1.0000 | ORAL_TABLET | Freq: Two times a day (BID) | ORAL | 0 refills | Status: DC

## 2023-11-28 MED ORDER — LINAGLIPTIN 5 MG PO TABS
5.0000 mg | ORAL_TABLET | Freq: Every day | ORAL | 0 refills | Status: DC
Start: 2023-11-28 — End: 2023-11-28

## 2023-11-28 MED ORDER — FREESTYLE LIBRE 3 PLUS SENSOR MISC: 1 refills | Status: DC

## 2023-11-28 NOTE — Patient Instructions (Addendum)
  Continue glipizide  5 mg once daily.  Continue metformin  500 mg XR two tablets once daily.   For insulin  you are going to inject one nightly tresiba 10 units.  You will increase every 3-4 days by 2 units maximum until sugars are less than 200   A referral was placed today to pharmacy to help manage diabetes as well.  Please let us  know if you have not heard back within 2 weeks about the referral.  Take augmentin  for suspected sinus infection which may be increasing your sugar levels as well.

## 2023-11-28 NOTE — Progress Notes (Signed)
 Established Patient Office Visit  Subjective:      CC:  Chief Complaint  Patient presents with   Diabetes    HPI: Katie Buckley is a 73 y.o. female presenting on 11/28/2023 for Diabetes  DM2: has had over two instances where her sugar got really low and she was unsteady and she fell (she remembers she was standing and then on the ground the next , did not lose consciousness) witnessed by her husband both times. She is not on insulin .   She is currently taking metformin  500 mg XR two tablets once daily as well as glipizide  (but was only taking every day vs BID as prescribed). She is no longer on jardiance  as it upset her stomach.   She reports in the last month she hasn't felt quite good.  She has felt overly tired without any energy and lately she has had trouble resting and very fidgeting, restless. She has noticed some frequency of urine. She does have issues with her bladder and recently was evaluated by a urologist as she is experiencing urinary incontinence functional. She had a cystoscopy Monday and she was started on Gemtesa . She is noting less urgency. Urinalysis was checked their 6/16 and had 3+ glucose in the urine, no bacteria in the urine.  Denies excessive thirst, excessive hunger. She does report nausea and headaches and also feels unsteady if she moves suddenly or turns her head,she always has sinus 'stuff going on'. No change in mental status. She is sneezing often as well. She does note slightly increased sinus pressure more than usual. She does see Ent. She has pain around her cheek bones and her eyes. She takes allegra regularly and also uses flonase . No change in breathing no sob.   She has noticed in the last week that her sugars have been in the 400's.     Social history:  Relevant past medical, surgical, family and social history reviewed and updated as indicated. Interim medical history since our last visit reviewed.  Allergies and medications reviewed  and updated.  DATA REVIEWED: CHART IN EPIC     ROS: Negative unless specifically indicated above in HPI.    Current Outpatient Medications:    acetaminophen  (TYLENOL ) 650 MG CR tablet, Take 1,300 mg by mouth every 8 (eight) hours as needed for pain (pain)., Disp: , Rfl:    amLODipine  (NORVASC ) 5 MG tablet, Take 1 tablet (5 mg total) by mouth daily., Disp: 30 tablet, Rfl: 1   amoxicillin -clavulanate (AUGMENTIN ) 875-125 MG tablet, Take 1 tablet by mouth 2 (two) times daily., Disp: 20 tablet, Rfl: 0   apixaban  (ELIQUIS ) 5 MG TABS tablet, Take 1 tablet (5 mg total) by mouth 2 (two) times daily., Disp: 60 tablet, Rfl: 2   Blood Glucose Monitoring Suppl (CONTOUR NEXT MONITOR) w/Device KIT, Use to check blood sugar daily, Disp: 1 kit, Rfl: 0   buPROPion  (WELLBUTRIN  SR) 150 MG 12 hr tablet, Take 150 mg by mouth 2 (two) times daily., Disp: , Rfl:    butalbital -acetaminophen -caffeine  (FIORICET) 50-325-40 MG tablet, TAKE 1 TABLET BY MOUTH EVERY 6 HOURS AS NEEDED FOR HEADACHE., Disp: 30 tablet, Rfl: 4   Continuous Glucose Sensor (FREESTYLE LIBRE 3 PLUS SENSOR) MISC, Use to check blood sugar continuously. Change sensor every 15 days., Disp: 6 each, Rfl: 1   ezetimibe  (ZETIA ) 10 MG tablet, Take 1 tablet (10 mg total) by mouth at bedtime., Disp: 90 tablet, Rfl: 3   FLUoxetine (PROZAC) 40 MG capsule, Take 40 mg by  mouth daily., Disp: , Rfl:    fluticasone  (FLONASE ) 50 MCG/ACT nasal spray, Place 1 spray into both nostrils daily as needed for allergies or rhinitis., Disp: , Rfl:    glipiZIDE  (GLUCOTROL ) 5 MG tablet, Take 1 tablet (5 mg total) by mouth in the morning and at bedtime., Disp: 180 tablet, Rfl: 1   glucose blood (CONTOUR NEXT TEST) test strip, Use to check blood sugar once daily, Disp: 100 each, Rfl: 3   insulin  degludec (TRESIBA FLEXTOUCH) 100 UNIT/ML FlexTouch Pen, Inject Eagle 10 u/d. Increase only as instructed to MDD 40 units, Disp: 12 mL, Rfl: 0   irbesartan  (AVAPRO ) 75 MG tablet, Take 1 tablet  (75 mg total) by mouth daily., Disp: 90 tablet, Rfl: 4   Lancet Devices MISC, , Disp: , Rfl:    metFORMIN  (GLUCOPHAGE -XR) 500 MG 24 hr tablet, TAKE 2 TABLETS BY MOUTH EVERY DAY, Disp: 180 tablet, Rfl: 3   metoprolol  tartrate (LOPRESSOR ) 50 MG tablet, Take 1 tablet (50 mg total) by mouth 2 (two) times daily., Disp: 60 tablet, Rfl: 2   ondansetron  (ZOFRAN ) 4 MG tablet, TAKE 1 TABLET BY MOUTH DAILY AS NEEDED FOR NAUSEA OR VOMITING., Disp: 90 tablet, Rfl: 0   oxycodone  (OXY-IR) 5 MG capsule, Take 1 capsule (5 mg total) by mouth every 6 (six) hours as needed for up to 8 doses., Disp: 8 capsule, Rfl: 0   pregabalin  (LYRICA ) 75 MG capsule, TAKE 1 CAPSULE BY MOUTH TWICE A DAY, Disp: 60 capsule, Rfl: 2   RABEprazole  (ACIPHEX ) 20 MG tablet, Take 1 tablet (20 mg total) by mouth 2 times daily at 12 noon and 4 pm., Disp: 180 tablet, Rfl: 3   rosuvastatin  (CRESTOR ) 20 MG tablet, TAKE 1 TABLET BY MOUTH EVERY DAY IN THE EVENING, Disp: 90 tablet, Rfl: 1   traZODone  (DESYREL ) 50 MG tablet, Take 50-100 mg by mouth at bedtime., Disp: , Rfl:    Vibegron  (GEMTESA ) 75 MG TABS, Take 1 tablet (75 mg total) by mouth daily., Disp: , Rfl:    Vibegron  (GEMTESA ) 75 MG TABS, Take 1 tablet (75 mg total) by mouth daily for 14 days., Disp: , Rfl:    Vibegron  (GEMTESA ) 75 MG TABS, Take 1 tablet (75 mg total) by mouth daily., Disp: 30 tablet, Rfl: 11      Objective:    BP 132/70 (BP Location: Right Arm, Patient Position: Sitting, Cuff Size: Normal)   Pulse (!) 57   Temp 98 F (36.7 C) (Temporal)   Ht 5' (1.524 m)   Wt 139 lb 3.2 oz (63.1 kg)   SpO2 96%   BMI 27.19 kg/m   Wt Readings from Last 3 Encounters:  11/28/23 139 lb 3.2 oz (63.1 kg)  11/26/23 139 lb (63 kg)  08/14/23 135 lb (61.2 kg)    Physical Exam Vitals reviewed.  Constitutional:      General: She is not in acute distress.    Appearance: Normal appearance. She is normal weight. She is not ill-appearing, toxic-appearing or diaphoretic.  HENT:     Head:  Normocephalic.     Right Ear: Tympanic membrane normal.     Left Ear: Tympanic membrane normal.     Nose: Nose normal.     Mouth/Throat:     Mouth: Mucous membranes are dry.     Pharynx: Postnasal drip present. No oropharyngeal exudate or posterior oropharyngeal erythema.   Eyes:     Extraocular Movements: Extraocular movements intact.     Pupils: Pupils are equal, round, and  reactive to light.    Cardiovascular:     Rate and Rhythm: Normal rate and regular rhythm.     Pulses: Normal pulses.     Heart sounds: Normal heart sounds.  Pulmonary:     Effort: Pulmonary effort is normal.     Breath sounds: Normal breath sounds.   Musculoskeletal:        General: Normal range of motion.     Cervical back: Normal range of motion.   Neurological:     General: No focal deficit present.     Mental Status: She is alert and oriented to person, place, and time. Mental status is at baseline.   Psychiatric:        Mood and Affect: Mood normal.        Behavior: Behavior normal.        Thought Content: Thought content normal.        Judgment: Judgment normal.           Assessment & Plan:  Type 2 diabetes mellitus with hyperglycemia, without long-term current use of insulin  (HCC) Assessment & Plan: A1c in office today 9.7  Continue metformin  500 mg XR once daily as well as glipizide  at the twice daily dose risk for DKA reassuring as without ketones in urine   Orders: -     POCT glycosylated hemoglobin (Hb A1C) -     AMB Referral [facility] Care Management -     POCT Glucose Fingerstick -     FreeStyle Libre 3 Plus Sensor; Use to check blood sugar continuously. Change sensor every 15 days.  Dispense: 6 each; Refill: 1 -     Tresiba FlexTouch; Inject Kaylor 10 u/d. Increase only as instructed to MDD 40 units  Dispense: 12 mL; Refill: 0  Essential hypertension  Other fatigue Assessment & Plan: Cbc bmp ordered pending results.   Orders: -     Basic metabolic panel with GFR -     CBC with  Differential/Platelet  Acute recurrent maxillary sinusitis -     Amoxicillin -Pot Clavulanate; Take 1 tablet by mouth 2 (two) times daily.  Dispense: 20 tablet; Refill: 0  Uncontrolled type 2 diabetes mellitus with hyperglycemia (HCC) Assessment & Plan: At this time goal is less than 140 avoiding lows less than 70  Rx tresiba 100 units/ml  Inject 10 units to start nightly increasing by 2 units every 3-4 days for goal < 140  Continue metformin  500 mg XR two tablets once daily  Continue glipizide  but only take 500 mg once daily.   Discussed sx of DKA and what to watch out for.  Close f/u in two weeks. Referral to pharmacy team as well to help manage diabetes care.     Orders: -     Horace Lye FlexTouch; Inject Archer 10 u/d. Increase only as instructed to MDD 40 units  Dispense: 12 mL; Refill: 0  Recurrent sinusitis Assessment & Plan: Acute today could be contributing to high sugars.  Prescription given for augmentin  875/125 mg po bid for ten days. Pt to continue tylenol /ibuprofen prn sinus pain. Continue with humidifier prn and steam showers recommended as well. instructed If no symptom improvement in 48 hours please f/u       Return in about 2 weeks (around 12/12/2023) for f/u diabetes.  Felicita Horns, MSN, APRN, FNP-C [facility]

## 2023-11-28 NOTE — Assessment & Plan Note (Addendum)
 At this time goal is less than 140 avoiding lows less than 70  Rx tresiba 100 units/ml  Inject 10 units to start nightly increasing by 2 units every 3-4 days for goal < 140  Continue metformin  500 mg XR two tablets once daily  Continue glipizide  but only take 500 mg once daily.   Discussed sx of DKA and what to watch out for.  Close f/u in two weeks. Referral to pharmacy team as well to help manage diabetes care.

## 2023-11-28 NOTE — Assessment & Plan Note (Signed)
 Cbc bmp ordered pending results.

## 2023-11-28 NOTE — Assessment & Plan Note (Signed)
 A1c in office today 9.7  Continue metformin  500 mg XR once daily as well as glipizide  at the twice daily dose risk for DKA reassuring as without ketones in urine

## 2023-11-28 NOTE — Assessment & Plan Note (Signed)
 Acute today could be contributing to high sugars.  Prescription given for augmentin  875/125 mg po bid for ten days. Pt to continue tylenol /ibuprofen prn sinus pain. Continue with humidifier prn and steam showers recommended as well. instructed If no symptom improvement in 48 hours please f/u

## 2023-11-30 ENCOUNTER — Telehealth: Payer: Self-pay

## 2023-11-30 NOTE — Progress Notes (Unsigned)
 Complex Care Management Note Care Guide Note  11/30/2023 Name: Katie Buckley MRN: [id number] DOB: 1951-05-25   Complex Care Management Outreach Attempts: An unsuccessful telephone outreach was attempted today to offer the patient information about available complex care management services.  Follow Up Plan:  Additional outreach attempts will be made to offer the patient complex care management information and services.   Encounter Outcome:  No Answer  [facility]  [facility], [facility] Health Care Management Assistant Direct Dial: [phone]  Fax: [phone]

## 2023-12-03 NOTE — Progress Notes (Unsigned)
 Complex Care Management Note Care Guide Note  12/03/2023 Name: Katie  [name] MRN: [id number] DOB: [birth year]   Complex Care Management Outreach Attempts: A second unsuccessful outreach was attempted today to offer the patient with information about available complex care management services.  Follow Up Plan:  Additional outreach attempts will be made to offer the patient complex care management information and services.   Encounter Outcome:  No Answer  [facility]  [facility], [facility] Health Care Management Assistant Direct Dial: [phone]  Fax: [phone]

## 2023-12-04 ENCOUNTER — Ambulatory Visit
Admission: RE | Admit: 2023-12-04 | Discharge: 2023-12-04 | Disposition: A | Discharge status: Home / Self Care | Source: Ambulatory Visit | Attending: Student | Admitting: Student

## 2023-12-04 ENCOUNTER — Encounter: Admitting: Student

## 2023-12-04 DIAGNOSIS — M8580 Other specified disorders of bone density and structure, unspecified site: Secondary | ICD-10-CM | POA: Diagnosis not present

## 2023-12-04 DIAGNOSIS — I700 Atherosclerosis of aorta: Secondary | ICD-10-CM | POA: Diagnosis not present

## 2023-12-04 DIAGNOSIS — S42292A Other displaced fracture of upper end of left humerus, initial encounter for closed fracture: Secondary | ICD-10-CM | POA: Insufficient documentation

## 2023-12-04 NOTE — Progress Notes (Signed)
 Complex Care Management Note  Care Guide Note 12/04/2023 Name: Katie Buckley MRN: [id number] DOB: September 12, 1950  Katie Buckley is a 73 y.o. year old female who sees Corwin Antu, FNP for primary care. I reached out to Katie Buckley by phone today to offer complex care management services.  Katie Buckley was given information about Complex Care Management services today including:   The Complex Care Management services include support from the care team which includes your Nurse Care Manager, Clinical Social Worker, or Pharmacist.  The Complex Care Management team is here to help remove barriers to the health concerns and goals most important to you. Complex Care Management services are voluntary, and the patient may decline or stop services at any time by request to their care team member.   Complex Care Management Consent Status: Patient agreed to services and verbal consent obtained.   Follow up plan:  Telephone appointment with complex care management team member scheduled for:  12/07/23 at 10:30 a.m.   Encounter Outcome:  Patient Scheduled  [facility]  [facility], [facility] Health Care Management Assistant Direct Dial: [phone]  Fax: [phone]

## 2023-12-04 NOTE — Progress Notes (Signed)
 Complex Care Management Note Care Guide Note  12/04/2023 Name: [name] MRN: [id number] DOB: 04-19-1951   Complex Care Management Outreach Attempts: A third unsuccessful outreach was attempted today to offer the patient with information about available complex care management services.  Follow Up Plan:  No further outreach attempts will be made at this time. We have been unable to contact the patient to offer or enroll patient in complex care management services.  Encounter Outcome:  No Answer  [facility]  [facility], [place], [facility] Health Care Management Assistant Direct Dial: [phone]  Fax: [phone]

## 2023-12-05 DIAGNOSIS — M25522 Pain in left elbow: Secondary | ICD-10-CM | POA: Diagnosis not present

## 2023-12-07 ENCOUNTER — Other Ambulatory Visit (INDEPENDENT_AMBULATORY_CARE_PROVIDER_SITE_OTHER): Admitting: Pharmacist

## 2023-12-07 DIAGNOSIS — E1165 Type 2 diabetes mellitus with hyperglycemia: Secondary | ICD-10-CM

## 2023-12-07 DIAGNOSIS — M25522 Pain in left elbow: Secondary | ICD-10-CM | POA: Diagnosis not present

## 2023-12-07 NOTE — Patient Instructions (Signed)
 Ms. Katie  TAKEELA Buckley,   It was a pleasure to speak with you today! As we discussed:?   Continue Tresiba  insulin . You may increase your dose to 14 untis.  Only increase your Tresiba  dose if your fasting blood sugar (first thing in the morning before any food/drink) is consistently higher than 130 mg/dL. We discussed refilling your glipizide  prescription for the long-acting version. The quick-acting version you have works relatively quickly and can cause lows if you are not taking it prior to a breakfast meal.  Hopefully we will not need to stay on the glipizide  for much longer if we see improvement in blood sugars with your medicine / diet/ exercise.  You may start with one 2.5 mg tablet daily. If we feel sugars through the day are much higher with this, you may increase to 2 tablets (5 mg total - your current dose).     Blood Sugar Goals: Fasting blood sugar (8+ hours without food, typically first thing in the morning): Less than 130 mg/dL Post-prandial aka after meal blood sugars: Within 2 hours after eating a meal, your sugar ideally will be back below 180 mg/dL.   Choose healthy, lower carb lower calorie snacks: toss salad, cooked vegetables, cottage cheese, peanut butter, low fat cheese / string cheese, lower sodium deli meat, tuna salad or chicken salad  Dietary Considerations for diabetes: Choose healthy, lower carb lower calorie snacks: tossed salad, cooked vegetables, cottage cheese, peanut butter, low fat cheese / string cheese, lower sodium deli meat, tuna salad or chicken salad.         Please reach out prior to your next scheduled appointment should you have any questions or concerns.  Thank you!   Future Appointments  Date Time Provider Department Center  01/07/2024 10:00 AM Lovely Glenis LABOR, RD [facility] None  02/25/2024 10:45 AM Gaston Hamilton, MD [facility] None  05/01/2024  1:40 PM [facility] ANNUAL WELLNESS VISIT 1 [facility] PEC  07/15/2024 10:30 AM [facility]  [facility] None  07/15/2024 10:45 AM Rennie Cindy SAUNDERS, MD [facility] None    Katie Buckley, PharmD Clinical Pharmacist [facility] [phone]

## 2023-12-07 NOTE — Progress Notes (Unsigned)
 12/07/2023 Name: Katie Buckley MRN: [id number] DOB: 1951-04-27  Subjective  No chief complaint on file.   Reason for visit: ?  Katie Buckley  Katie Buckley is a 73 y.o. female with a history of diabetes (type 2), who presents today for an initial diabetes pharmacotherapy visit.?  Pertinent PMH also includes CAD (s/p CABG), HFpEF, HTN, PAF, PVD, Barret esophagus, HLD, B12 deficiency, depression   Known DM Complications: PAD, gastroparesis, retinopathy, neuropathy, nephropathy w hx microalbuminuria  Care Team: Primary Care Provider: Corwin Antu, FNP   Date of Last Diabetes Related Visit: {LZ with PCP with pharmacist:31532}  Recent Summary of Change: 6/18: Start Tresiba  10 u/d  Medication Access/Adherence: Prescription drug coverage: Payor: [employer] / Plan: [employer] PPO / Product Type: *No Product type* / .  - Reports that all medications are *** affordable.  - Current Patient Assistance: None*** - Medication Adherence: Patient denies*** missing doses of their medication.    Since Last visit / History of Present Illness: ?  Patient reports implementing*** plan from last visit. Denies adverse effects with titration of ***.   Typically eats small snacks throughout the day with largest meal at night. Bloating is concentrated to the evening/largest meal. She is aware of gastroparesis diagnosis.   Has been working on trying to eat meals rather than grazing. Feels when she goes to eat larger meals, instantly feels quite bloated. Has tried Gas-X which helps some though not completely.   Has gotten signed up today for her diabetes cooking classes. Believes first class is end of July.   Reported DM Regimen: ?  Glipizide  5 mg each morning (~10 am) Metformin  500 mg *** Tresiba  12 units sq once daily (increased form 10 units ~2-3 days ago)   DM medications tried in the past:?  Jardiance  (d/c 2023 due to nausea) Trulicity , Ozempic , Mounjaro  (has had a hard time  tolerating these, stomach upset) Invakomet (insurance stopped covering)  SMBG: Libre 3+ ?(Connected to [employer]) CGM Report (Date Range 6/20 - 6/27) ABG 177 mg/dL; GMI 2.4%; Variability 22.7%; TIR 57%, high 39%, very high 4%, low 0%, very low 0%      Hypo/Hyperglycemia: ?  Symptoms of hypoglycemia since last visit:? no.  Lowest reading in the 80s (lowest she has seen, made her slightly uneasy) Symptoms of hyperglycemia since last visit:? no   Reported Diet: Patient typically eats ~1 meal per day with grazing.  Stays busy and feels she does not eat much. Breakfast: Skips. Has started eating a pack of crackers.  Lunch: half sandwich  Dinner: Largest meal of the day.  DM Prevention:  Statin: Taking; high intensity.?  History of chronic kidney disease? no History of albuminuria? yes, last UACR on 97.2 = *** mg/g ACE/ARB - Taking irbesartan  75mg ; Urine MA/CR Ratio - elevated urinary albumin  excretion.  Last eye exam: 10/27/22; Retinopathy Present Last foot exam: No foot exam found Tobacco Use: Never smoker  Immunizations:? Flu: Up to date (Last: 03/28/2023); Pneumococcal: PPSV23 (2013, 2019 after age 71) PCV13 (2018); Shingrix: No Record - DUE (Last: Zostavax 2013)  Cardiovascular Risk Reduction History of clinical ASCVD? no The 10-year ASCVD risk score (Arnett DK, et al., 2019) is: 27.4% History of heart failure? no History of hyperlipidemia? yes Current BMI: 27.1 kg/m2 (Ht 60 in, Wt 63.1 kg) Taking statin? yes; high intensity (rosuvastatin  20mg ) Taking aspirin ? not indicated; Not taking   Taking SGLT-2i? no Taking GLP- 1 RA? no      _______________________________________________  Objective    Vitals:  Wt  Readings from Last 3 Encounters:  11/28/23 139 lb 3.2 oz (63.1 kg)  11/26/23 139 lb (63 kg)  08/14/23 135 lb (61.2 kg)   BP Readings from Last 3 Encounters:  11/28/23 132/70  11/26/23 (!) 198/65  08/14/23 (!) 138/93   Pulse Readings from Last 3 Encounters:   11/28/23 (!) 57  11/26/23 (!) 57  08/14/23 (!) 53     Labs:?  Lab Results  Component Value Date   HGBA1C 9.7 (A) 11/28/2023   HGBA1C 10.7 (H) 07/24/2023   HGBA1C 8.1 (H) 10/24/2022   GLUCOSE 259 (H) 11/28/2023   MICRALBCREAT 97.2 (H) 07/24/2023   MICRALBCREAT 6.3 03/08/2022   MICRALBCREAT 27 09/30/2021   CREATININE 0.85 11/28/2023   CREATININE 0.76 08/14/2023   CREATININE 0.80 07/24/2023   GFR 68.50 11/28/2023   GFR 73.85 07/24/2023   GFR 86.05 03/08/2022    Lab Results  Component Value Date   CHOL 182 07/24/2023   LDLCALC 53 07/24/2023   LDLCALC 21 09/21/2021   LDLCALC 108 (H) 02/09/2021   HDL 95.20 07/24/2023   TRIG 171.0 (H) 07/24/2023   TRIG 122 09/21/2021   TRIG 238 (H) 02/09/2021   ALT 24 08/14/2023   ALT 26 07/24/2023   AST 32 08/14/2023   AST 31 07/24/2023      Chemistry      Component Value Date/Time   NA 136 11/28/2023 1045   NA 135 02/09/2021 1107   NA 138 02/08/2014 1717   K 4.2 11/28/2023 1045   K 3.7 02/08/2014 1717   CL 98 11/28/2023 1045   CL 99 02/08/2014 1717   CO2 32 11/28/2023 1045   CO2 31 02/08/2014 1717   BUN 20 11/28/2023 1045   BUN 11 02/09/2021 1107   BUN 13 02/08/2014 1717   CREATININE 0.85 11/28/2023 1045   CREATININE 0.61 05/01/2017 0842   GLU 152 07/22/2014 0000      Component Value Date/Time   CALCIUM  9.5 11/28/2023 1045   CALCIUM  9.0 02/08/2014 1717   ALKPHOS 63 08/14/2023 1848   ALKPHOS 102 02/08/2014 1717   AST 32 08/14/2023 1848   AST 31 02/08/2014 1717   ALT 24 08/14/2023 1848   ALT 41 02/08/2014 1717   BILITOT 0.8 08/14/2023 1848   BILITOT 0.2 09/01/2020 0907   BILITOT 0.2 02/08/2014 1717     The 10-year ASCVD risk score (Arnett DK, et al., 2019) is: 27.4%  Assessment and Plan:   1. Diabetes, type 2: uncontrolled per last A1c of 9.7% (11/28/23) though with significant improvement on CGM today. Goal <7% without hypoglycemia.  Katie Buckley shows: ABG 177, GMI 7.5%, TIR 57%. No readings <70. Occasional BG down to  ~80 afternoon/evening. Taking glipizide  IR in the morning though fasts all day. Will transition to XR, though hopeful she will not need glipizide  much longer with ongoing BG improvement w insulin  titration.    Current Regimen: metformin  XR 1000 mg/d, Tresiba  12 u/d, glipizide  IR 5mg  Qam.    Diet: Large portion of initial visit reviewing dietary questions Goal to start eating smaller meals throughout the day (current practice one large meal at night resulting in bloating/discomfort).  Long discussion of effects of macronutrients on BG spikes/BG stability. Reviewed various protein sources. Goal to pair all carb snacks w a protein next week. Diabetes cooking class scheduled next month     Medication: Continue insulin  titration as instructed to 14 units sq once daily (+2U) for FBG consistently elevated  Glipizide  5 mg IR ? glipizide  XR 2.5 mg  once daily x1 week (increase to XR 5mg /d if no c/f afternoon/evening lows) Connected patient to [facility] provider portal  Reviewed s/sx/tx hypoglycemia PharmD Katie Buckley review in 1 week  Future Consideration: Metformin : Tolerating very well w/o concerns. Consider increase to 3 tabs/day. Patient amenable.  SGLT2i: Avoid at this time with ongoing urinary concerns, possible nausea previously (though albuminuria present on last labs). Future discussion of low dose Farxiga/Jardiance  reasonable if ongoing c/f kidney dysfunction : Avoid due to previous concerns with nausea and gastroparesis : Avoiding due to possible weight gain/increase in fracture risk.    4. Healthcare Maintenance:  Pneumococcal - Current status: PPSV23 s/p age 72 - Up to date  (risk/benefit discussion of PCV20 reasonable)  Shingles - Current status: No record - DUE (x1 zostavax 2013) Influenza - Current status: Up to date  Due to receive the following vaccines: Shingrix   Follow Up Follow up with clinical pharmacist via phone in 1 weeks Patient given direct line for questions regarding  medication therapy/blood sugar concerns   Future Appointments  Date Time Provider Department Center  12/07/2023 10:30 AM [facility] PHARMACIST [facility]  02/25/2024 10:45 AM Gaston Hamilton, MD [facility] None  05/01/2024  1:40 PM [facility] ANNUAL WELLNESS VISIT 1 [facility] PEC  07/15/2024 10:30 AM [facility] None  07/15/2024 10:45 AM Rennie Cindy SAUNDERS, MD [facility] None    [name], PharmD Clinical Pharmacist [facility] [phone]

## 2023-12-07 NOTE — Addendum Note (Signed)
 Addended by: CORWIN ANTU on: 12/07/2023 08:55 AM   Modules accepted: Orders

## 2023-12-07 NOTE — Telephone Encounter (Signed)
 Pt said she has an am consult with you this am.  She is c/o bloating. I will await your consult with her for more details, seems to have started when she started the insulin .

## 2023-12-10 NOTE — Telephone Encounter (Signed)
Great thank you so much :)

## 2023-12-12 DIAGNOSIS — M25522 Pain in left elbow: Secondary | ICD-10-CM | POA: Diagnosis not present

## 2023-12-12 MED ORDER — GLIPIZIDE ER 2.5 MG PO TB24: ORAL_TABLET | ORAL | 0 refills | Status: DC

## 2023-12-18 DIAGNOSIS — M25522 Pain in left elbow: Secondary | ICD-10-CM | POA: Diagnosis not present

## 2023-12-19 ENCOUNTER — Encounter: Payer: Self-pay | Admitting: Family

## 2023-12-21 ENCOUNTER — Other Ambulatory Visit (INDEPENDENT_AMBULATORY_CARE_PROVIDER_SITE_OTHER): Payer: Self-pay | Admitting: Pharmacist

## 2023-12-21 DIAGNOSIS — E1165 Type 2 diabetes mellitus with hyperglycemia: Secondary | ICD-10-CM

## 2023-12-21 DIAGNOSIS — M25522 Pain in left elbow: Secondary | ICD-10-CM | POA: Diagnosis not present

## 2023-12-21 NOTE — Progress Notes (Signed)
 Brief Telephone Documentation Reason for Call: Insulin  monitoring   Summary of Call: Called patient to review Libre/insulin  dosing.   Last spoke 2 weeks ago. Patient confirms transition from glipizide  5 mg to XR formulation at 2.5 mg daily.  Has titrated her insulin  in 2 units increments for elevated fasting BG. Most currently, has increased to 16 units once daily.   Reported Regimen: Glipizide  XL 2.5 mg each  morning Tresiba  16 units once daily Metformin  XR 1000 mg BID  SMBG (Libre 3+) Notes she has checked her glucometer when CGM alarm indicated low. Lowest glucometer reading = 90 mg/dL.       ASSESSMENT/PLAN Significant improvement in BG readings this most recent week though with a couple days with evening lows on CGM, ~6-8pm (though glucometer indicated BG ~90 mg/dL). Discussed reducing insulin  dose 2 units, and holding at this dose for the next week without further titrations.  Tresiba  16 units ? 14 units daily Continue glipizide  XL 2.5 mg once daily  Continue metformin  XR  Follow Up: Patient given direct line for further questions/concerns. Brief Libre review ~1 week   Katie Buckley, PharmD Clinical Pharmacist [facility] [phone]

## 2023-12-21 NOTE — Patient Instructions (Signed)
 Ms. Sharday  J Neubecker,   It was a pleasure to speak with you today! As we discussed:?   Continue Tresiba  insulin . You may reduce your dose to 14 units and hold dose here for the next week to see if evening alarms stop.  Continue all other medications as you have been taking them   If you check your blood sugar level and it is less than 70 mg/dL (or if you have symptoms of low blood sugar - shaky, sweaty, weak), you should treat by having a quick sugar.   How to treat low blood sugar:  - For blood sugar less than 70: Treat with 4 ounces of juice or regular soda, or with 3 to 4 glucose tablets.  - Re-check blood sugar in 15 minutes. ?  If blood sugar is still less than 70 on re-check, treat again and re-check in 15 minutes   Please reach out prior to your next scheduled appointment should you have any questions or concerns.  You may respond directly to this message, or leave me a voicemail at [phone] and I will get back to you shortly.   Thank you!   Future Appointments  Date Time Provider Department Center  01/07/2024 10:00 AM Lovely Glenis LABOR, RD [facility] None  02/25/2024 10:45 AM Gaston Hamilton, MD [facility] None  05/01/2024  1:40 PM [facility] ANNUAL WELLNESS VISIT 1 [facility]  07/15/2024 10:30 AM [facility] None  07/15/2024 10:45 AM Rennie Cindy SAUNDERS, MD [facility] None   [name], PharmD Clinical Pharmacist [facility] [phone]

## 2023-12-25 ENCOUNTER — Other Ambulatory Visit: Payer: Self-pay | Admitting: Family

## 2023-12-25 DIAGNOSIS — E1165 Type 2 diabetes mellitus with hyperglycemia: Secondary | ICD-10-CM

## 2023-12-26 DIAGNOSIS — M25522 Pain in left elbow: Secondary | ICD-10-CM | POA: Diagnosis not present

## 2023-12-27 ENCOUNTER — Other Ambulatory Visit (INDEPENDENT_AMBULATORY_CARE_PROVIDER_SITE_OTHER): Payer: Self-pay | Admitting: Pharmacist

## 2023-12-27 DIAGNOSIS — E1165 Type 2 diabetes mellitus with hyperglycemia: Secondary | ICD-10-CM

## 2023-12-27 NOTE — Patient Instructions (Addendum)
 [name]  Katie Buckley,   It was a pleasure to speak with you today! As we discussed:?   Continue all medications without changes See below information related to diabetes goals and nutrition as discussed  Blood Sugar Goals: Fasting blood sugar: Blood sugar level after 8 or more hours since last food (usually first thing in the morning before breakfast) Less than 130 mg/dL  After meal blood sugar: 2 hours after finishing a meal Less than 180 mg/dL   How to treat low blood sugar:  A blood sugar is considered Low if it is less than 70 mg/dL Rule of 15 For blood sugar less than 70: Treat with 15 grams of sugar (4 ounces of juice or regular soda, or with 3 to 4 glucose tablets) Re-check blood sugar after 15 minutes If blood sugar is still less than 70 on re-check, treat again and re-check in 15 minutes   Nutrition: Protein: Protein can help to stabilize the blood sugar May reduce how high the blood sugar spikes after eating May reduce how quickly blood sugar falls (lowers risk of low blood sugars happening quickly) Plant-based Protein:  Oats/oatmeal are a good source of protein.  There are countless very easy and quick ways to prepare plain oats. Oatmeal, overnight oats in the fridge, baking oats in the oven Adding some sugar-free sweetener (Splenda), low-sugar protein powder and/or peanut butter powder can make oats more enjoyable. Try a baked oatmeal this way! Snacks: Microwavable Edamame, nuts, beans/legumes, lentils, low-sugar nut butters (or powdered peanut butter) Animal-Based Protein: Low-fat dairy Low-fat string cheese, sugar-free Greek yogurt (example brand = Oikos), cottage cheese You could try the yogurt in a smoothie. Optional, blend with some protein powder and oat milk or water . You can add some fruits/veggies to add extra nutrients/fiber.  Eggs (hard boil for quick snack, or to quickly add to salads), egg-salad sandwich, scrambled, etc.  Meatsa Lean meat such as  chicken breast, fish, [place] Meats that are grilled, boiled or baked (rather than fried) Protein powder:  There are plant-based powders or milk-based powders.  Mix in a smoothie, or mix with water /milk Protein powder is also very good mixed into oatmeal!   Please reach out prior to your next scheduled appointment should you have any questions or concerns.  You may respond directly to this message, or leave me a voicemail at [phone] and I will get back to you shortly.   Thank you!   Future Appointments  Date Time Provider Department Center  01/07/2024 10:00 AM Lovely Glenis LABOR, RD [facility] None  02/25/2024 10:45 AM Gaston Hamilton, MD [facility] None  05/01/2024  1:40 PM [facility] ANNUAL WELLNESS VISIT 1 [facility] PEC  07/15/2024 10:30 AM [facility] None  07/15/2024 10:45 AM Rennie Cindy SAUNDERS, MD [facility] None   Katie Buckley, PharmD Clinical Pharmacist [facility] [phone]

## 2023-12-27 NOTE — Progress Notes (Signed)
 Brief Telephone Documentation Reason for Call: Insulin  monitoring   Summary of Call: Called patient to review Libre/insulin  dosing.   Last spoke 1 week ago. Patient confirms insulin  dose reduction.   Reported Regimen: Glipizide  XL 2.5 mg each morning Tresiba  14 units once daily Metformin  XR 1000 mg BID  SMBG (Libre 3+)     ASSESSMENT/PLAN Compared to last week, evening lows have resolved completely on lower Tresiba  dose. Only 1 low in the past 7 days which occurred between lunch/dinner (very light lunch, late dinner). [name] feels she has gained a lot of new insight with her [name]. Has begun to recognize foods that are hiding high amounts of sugar (especially beverages/coffee drinks).  Continue Tresiba  14 units daily Continue glipizide  XL 2.5 mg once daily  Continue metformin  XR 1000 mg/d Provided dietary considerations/protein examples via MyChart.  Dietary Goals:  Increase snacks/protein intake mid-day if not hungry for a meal More food in the afternoon as to avoid large late night meals Exercise Goals:  Slowly increase step quantity each day.  Consider strength-training home exercises such as yoga or chair exercises to help strengthen.    Follow Up: Patient given direct line for further questions/concerns. Brief Libre review ~1 week   Katie Buckley, PharmD Clinical Pharmacist [facility] [phone]

## 2023-12-28 DIAGNOSIS — M25522 Pain in left elbow: Secondary | ICD-10-CM | POA: Diagnosis not present

## 2023-12-31 NOTE — Progress Notes (Deleted)
  Start: *** end: *** Patient is here today *** Patient would like to learn *** Patient lives with ***.  *** shopping and cooking.  History includes:  *** Medications include:  *** Labs noted:  ***   9.7% Glip, met, tresiba  10

## 2024-01-01 DIAGNOSIS — M25522 Pain in left elbow: Secondary | ICD-10-CM | POA: Diagnosis not present

## 2024-01-02 ENCOUNTER — Other Ambulatory Visit: Payer: Self-pay | Admitting: Family

## 2024-01-02 DIAGNOSIS — E1165 Type 2 diabetes mellitus with hyperglycemia: Secondary | ICD-10-CM

## 2024-01-03 DIAGNOSIS — M25522 Pain in left elbow: Secondary | ICD-10-CM | POA: Diagnosis not present

## 2024-01-07 ENCOUNTER — Ambulatory Visit: Admitting: Dietician

## 2024-01-07 DIAGNOSIS — E1343 Other specified diabetes mellitus with diabetic autonomic (poly)neuropathy: Secondary | ICD-10-CM

## 2024-01-08 DIAGNOSIS — M25522 Pain in left elbow: Secondary | ICD-10-CM | POA: Diagnosis not present

## 2024-01-10 DIAGNOSIS — F331 Major depressive disorder, recurrent, moderate: Secondary | ICD-10-CM | POA: Diagnosis not present

## 2024-01-10 DIAGNOSIS — F411 Generalized anxiety disorder: Secondary | ICD-10-CM | POA: Diagnosis not present

## 2024-01-10 DIAGNOSIS — G4700 Insomnia, unspecified: Secondary | ICD-10-CM | POA: Diagnosis not present

## 2024-01-10 DIAGNOSIS — R41840 Attention and concentration deficit: Secondary | ICD-10-CM | POA: Diagnosis not present

## 2024-01-11 DIAGNOSIS — C44619 Basal cell carcinoma of skin of left upper limb, including shoulder: Secondary | ICD-10-CM | POA: Diagnosis not present

## 2024-01-11 DIAGNOSIS — L910 Hypertrophic scar: Secondary | ICD-10-CM | POA: Diagnosis not present

## 2024-01-11 DIAGNOSIS — L821 Other seborrheic keratosis: Secondary | ICD-10-CM | POA: Diagnosis not present

## 2024-01-11 DIAGNOSIS — C44311 Basal cell carcinoma of skin of nose: Secondary | ICD-10-CM | POA: Diagnosis not present

## 2024-01-11 DIAGNOSIS — D485 Neoplasm of uncertain behavior of skin: Secondary | ICD-10-CM | POA: Diagnosis not present

## 2024-01-15 DIAGNOSIS — M25522 Pain in left elbow: Secondary | ICD-10-CM | POA: Diagnosis not present

## 2024-01-22 DIAGNOSIS — M25522 Pain in left elbow: Secondary | ICD-10-CM | POA: Diagnosis not present

## 2024-01-24 DIAGNOSIS — M25522 Pain in left elbow: Secondary | ICD-10-CM | POA: Diagnosis not present

## 2024-01-25 DIAGNOSIS — S42292D Other displaced fracture of upper end of left humerus, subsequent encounter for fracture with routine healing: Secondary | ICD-10-CM | POA: Diagnosis not present

## 2024-01-25 DIAGNOSIS — M25811 Other specified joint disorders, right shoulder: Secondary | ICD-10-CM | POA: Diagnosis not present

## 2024-01-26 ENCOUNTER — Other Ambulatory Visit: Payer: Self-pay | Admitting: Family

## 2024-01-26 DIAGNOSIS — E1140 Type 2 diabetes mellitus with diabetic neuropathy, unspecified: Secondary | ICD-10-CM

## 2024-01-29 DIAGNOSIS — M25522 Pain in left elbow: Secondary | ICD-10-CM | POA: Diagnosis not present

## 2024-01-31 DIAGNOSIS — G245 Blepharospasm: Secondary | ICD-10-CM | POA: Diagnosis not present

## 2024-01-31 DIAGNOSIS — M25522 Pain in left elbow: Secondary | ICD-10-CM | POA: Diagnosis not present

## 2024-02-05 DIAGNOSIS — M25522 Pain in left elbow: Secondary | ICD-10-CM | POA: Diagnosis not present

## 2024-02-06 DIAGNOSIS — H02883 Meibomian gland dysfunction of right eye, unspecified eyelid: Secondary | ICD-10-CM | POA: Diagnosis not present

## 2024-02-06 DIAGNOSIS — S42292D Other displaced fracture of upper end of left humerus, subsequent encounter for fracture with routine healing: Secondary | ICD-10-CM | POA: Diagnosis not present

## 2024-02-06 DIAGNOSIS — E113292 Type 2 diabetes mellitus with mild nonproliferative diabetic retinopathy without macular edema, left eye: Secondary | ICD-10-CM | POA: Diagnosis not present

## 2024-02-06 DIAGNOSIS — E113291 Type 2 diabetes mellitus with mild nonproliferative diabetic retinopathy without macular edema, right eye: Secondary | ICD-10-CM | POA: Diagnosis not present

## 2024-02-06 DIAGNOSIS — H04123 Dry eye syndrome of bilateral lacrimal glands: Secondary | ICD-10-CM | POA: Diagnosis not present

## 2024-02-06 LAB — HM DIABETES EYE EXAM

## 2024-02-07 ENCOUNTER — Encounter: Payer: Self-pay | Admitting: Family

## 2024-02-08 ENCOUNTER — Other Ambulatory Visit: Payer: Self-pay | Admitting: Orthopedic Surgery

## 2024-02-12 ENCOUNTER — Other Ambulatory Visit: Payer: Self-pay | Admitting: Orthopedic Surgery

## 2024-02-13 NOTE — Telephone Encounter (Signed)
 Clearance was received from Cardiology and scanned into chart in May 2025. Please review. Thanks!

## 2024-02-14 NOTE — Telephone Encounter (Signed)
 This patient has history of breast implant removal and was being considered by Dr. Lowery for fat grafting to the right breast for improved contour/symmetry.  Clearance has been received by cardiology to hold her anticoagulation, however per note from [place]'s encounter 10/19/2023 I discussed this with Dr. Lowery and she recommended that surgery be postponed until patient's A1c comes down.  At that time, her A1c was 10.7%. Now it's 9.7%.   Dr. Lowery, does it need to come down further or are you OK to proceed with scheduling?

## 2024-02-15 ENCOUNTER — Ambulatory Visit: Admitting: General Practice

## 2024-02-15 ENCOUNTER — Encounter: Payer: Self-pay | Admitting: General Practice

## 2024-02-15 ENCOUNTER — Emergency Department

## 2024-02-15 ENCOUNTER — Other Ambulatory Visit: Payer: Self-pay

## 2024-02-15 ENCOUNTER — Ambulatory Visit: Payer: Self-pay | Admitting: General Practice

## 2024-02-15 ENCOUNTER — Emergency Department: Admission: EM | Admit: 2024-02-15 | Discharge: 2024-02-15 | Disposition: A | Discharge status: Home / Self Care

## 2024-02-15 ENCOUNTER — Telehealth: Payer: Self-pay

## 2024-02-15 VITALS — BP 130/50 | HR 61 | Temp 98.3°F | Ht 60.0 in | Wt 146.6 lb

## 2024-02-15 DIAGNOSIS — E119 Type 2 diabetes mellitus without complications: Secondary | ICD-10-CM | POA: Diagnosis not present

## 2024-02-15 DIAGNOSIS — I2510 Atherosclerotic heart disease of native coronary artery without angina pectoris: Secondary | ICD-10-CM | POA: Diagnosis not present

## 2024-02-15 DIAGNOSIS — I6782 Cerebral ischemia: Secondary | ICD-10-CM | POA: Diagnosis not present

## 2024-02-15 DIAGNOSIS — I509 Heart failure, unspecified: Secondary | ICD-10-CM | POA: Insufficient documentation

## 2024-02-15 DIAGNOSIS — R531 Weakness: Secondary | ICD-10-CM | POA: Diagnosis not present

## 2024-02-15 DIAGNOSIS — Z981 Arthrodesis status: Secondary | ICD-10-CM | POA: Diagnosis not present

## 2024-02-15 DIAGNOSIS — J181 Lobar pneumonia, unspecified organism: Secondary | ICD-10-CM | POA: Diagnosis not present

## 2024-02-15 DIAGNOSIS — R42 Dizziness and giddiness: Secondary | ICD-10-CM | POA: Diagnosis not present

## 2024-02-15 DIAGNOSIS — R296 Repeated falls: Secondary | ICD-10-CM | POA: Diagnosis not present

## 2024-02-15 DIAGNOSIS — R059 Cough, unspecified: Secondary | ICD-10-CM | POA: Diagnosis present

## 2024-02-15 DIAGNOSIS — R918 Other nonspecific abnormal finding of lung field: Secondary | ICD-10-CM | POA: Diagnosis not present

## 2024-02-15 DIAGNOSIS — R051 Acute cough: Secondary | ICD-10-CM

## 2024-02-15 DIAGNOSIS — R110 Nausea: Secondary | ICD-10-CM | POA: Diagnosis not present

## 2024-02-15 DIAGNOSIS — I110 Hypertensive heart disease with heart failure: Secondary | ICD-10-CM | POA: Insufficient documentation

## 2024-02-15 DIAGNOSIS — Z4789 Encounter for other orthopedic aftercare: Secondary | ICD-10-CM | POA: Diagnosis not present

## 2024-02-15 DIAGNOSIS — J189 Pneumonia, unspecified organism: Secondary | ICD-10-CM

## 2024-02-15 DIAGNOSIS — J168 Pneumonia due to other specified infectious organisms: Secondary | ICD-10-CM | POA: Diagnosis not present

## 2024-02-15 DIAGNOSIS — R001 Bradycardia, unspecified: Secondary | ICD-10-CM | POA: Diagnosis not present

## 2024-02-15 DIAGNOSIS — R55 Syncope and collapse: Secondary | ICD-10-CM | POA: Diagnosis not present

## 2024-02-15 LAB — RESP PANEL BY RT-PCR (RSV, FLU A&B, COVID)  RVPGX2
Influenza A by PCR: NEGATIVE
Influenza B by PCR: NEGATIVE
Resp Syncytial Virus by PCR: NEGATIVE
SARS Coronavirus 2 by RT PCR: NEGATIVE

## 2024-02-15 LAB — CBC
HCT: 28.8 % — ABNORMAL LOW (ref 36.0–46.0)
Hemoglobin: 9.2 g/dL — ABNORMAL LOW (ref 12.0–15.0)
MCH: 27.5 pg (ref 26.0–34.0)
MCHC: 31.9 g/dL (ref 30.0–36.0)
MCV: 86 fL (ref 80.0–100.0)
Platelets: 179 K/uL (ref 150–400)
RBC: 3.35 MIL/uL — ABNORMAL LOW (ref 3.87–5.11)
RDW: 13.2 % (ref 11.5–15.5)
WBC: 6.6 K/uL (ref 4.0–10.5)
nRBC: 0 % (ref 0.0–0.2)

## 2024-02-15 LAB — COMPREHENSIVE METABOLIC PANEL WITH GFR
ALT: 21 U/L (ref 0–44)
AST: 28 U/L (ref 15–41)
Albumin: 3.3 g/dL — ABNORMAL LOW (ref 3.5–5.0)
Alkaline Phosphatase: 62 U/L (ref 38–126)
Anion gap: 12 (ref 5–15)
BUN: 14 mg/dL (ref 8–23)
CO2: 27 mmol/L (ref 22–32)
Calcium: 9.2 mg/dL (ref 8.9–10.3)
Chloride: 102 mmol/L (ref 98–111)
Creatinine, Ser: 0.8 mg/dL (ref 0.44–1.00)
GFR, Estimated: >60 mL/min (ref >60)
Glucose, Bld: 97 mg/dL (ref 70–99)
Potassium: 4.1 mmol/L (ref 3.5–5.1)
Sodium: 141 mmol/L (ref 135–145)
Total Bilirubin: 0.4 mg/dL (ref 0.0–1.2)
Total Protein: 6.6 g/dL (ref 6.5–8.1)

## 2024-02-15 LAB — POC COVID19 BINAXNOW: SARS Coronavirus 2 Ag: NEGATIVE

## 2024-02-15 LAB — POCT INFLUENZA A/B
Influenza A, POC: NEGATIVE
Influenza B, POC: NEGATIVE

## 2024-02-15 LAB — URINALYSIS, ROUTINE W REFLEX MICROSCOPIC
Bilirubin Urine: NEGATIVE
Glucose, UA: 150 mg/dL — AB
Hgb urine dipstick: NEGATIVE
Ketones, ur: NEGATIVE mg/dL
Leukocytes,Ua: NEGATIVE
Nitrite: NEGATIVE
Protein, ur: NEGATIVE mg/dL
Specific Gravity, Urine: 1.012 (ref 1.005–1.030)
pH: 5 (ref 5.0–8.0)

## 2024-02-15 MED ORDER — CEFDINIR 300 MG PO CAPS: 300.0000 mg | ORAL_CAPSULE | Freq: Two times a day (BID) | ORAL | 0 refills | Status: AC

## 2024-02-15 MED ORDER — SODIUM CHLORIDE 0.9 % IV SOLN
1.0000 g | Freq: Once | INTRAVENOUS | Status: AC
  Administered 2024-02-15: 1 g via INTRAVENOUS
  Filled 2024-02-15: qty 10

## 2024-02-15 MED ORDER — AZITHROMYCIN 500 MG PO TABS
500.0000 mg | ORAL_TABLET | Freq: Once | ORAL | Status: AC
  Administered 2024-02-15: 500 mg via ORAL
  Filled 2024-02-15: qty 1

## 2024-02-15 MED ORDER — ONDANSETRON 4 MG PO TBDP
4.0000 mg | ORAL_TABLET | Freq: Once | ORAL | Status: AC
  Administered 2024-02-15: 4 mg via ORAL
  Filled 2024-02-15: qty 1

## 2024-02-15 MED ORDER — OXYCODONE-ACETAMINOPHEN 5-325 MG PO TABS
1.0000 | ORAL_TABLET | Freq: Once | ORAL | Status: AC
  Administered 2024-02-15: 1 via ORAL
  Filled 2024-02-15: qty 1

## 2024-02-15 MED ORDER — AZITHROMYCIN 500 MG PO TABS
500.0000 mg | ORAL_TABLET | Freq: Every day | ORAL | 0 refills | Status: AC
Start: 2024-02-15 — End: 2024-02-22

## 2024-02-15 MED ORDER — ONDANSETRON 4 MG PO TBDP: 4.0000 mg | ORAL_TABLET | Freq: Three times a day (TID) | ORAL | 0 refills | Status: DC | PRN

## 2024-02-15 MED ORDER — ACETAMINOPHEN 325 MG PO TABS: 650.0000 mg | ORAL_TABLET | Freq: Once | ORAL | Status: DC

## 2024-02-15 NOTE — Assessment & Plan Note (Addendum)
 Patient unstable during exam.  Fatigued and unable to keep eyes open during exam.   Given the length of symptoms and presentation today, 7 falls in the last 5 days, will sent patient to the ER.   She drove her self to the office; waiting for her husband and then will go to the ER.   Report called to charge nurse at [facility].

## 2024-02-15 NOTE — ED Notes (Signed)
Patient ambulatory to the restroom for urine sample.

## 2024-02-15 NOTE — Progress Notes (Signed)
 Established Patient Office Visit  Subjective   Patient ID: Katie  LANIER Buckley, female    DOB: Apr 28, 1951  Age: 73 y.o. MRN: [id number]  Chief Complaint  Patient presents with   Cough    That's dry, dizziness, 7 falls, headache diarrhea, nausea and vomiting x 5 or 6 days. Patient has been taking coricidin and tylenol  for sx.     Cough Associated symptoms include headaches and a sore throat. Pertinent negatives include no chills, ear pain or fever.   Katie  Buckley is a 73 year old female, patient of Ginger Patrick, FNP, presents today for an acute visit.   Discussed the use of AI scribe software for clinical note transcription with the patient, who gave verbal consent to proceed.  History of Present Illness Katie Buckley is a 73 year old female who presents with multiple falls and gastrointestinal symptoms.  Her symptoms began on Monday, February 11, 2024, during her trip to [place]. She describes feeling suddenly unwell, as if 'hit like a ton of bricks'.  Since the onset of symptoms, she has experienced seven falls, resulting in scrapes on her arm and leg, and generalized soreness. She feels extremely tired.  She has gastrointestinal symptoms including diarrhea, with two to three watery stools per day, and nausea with a single episode of vomiting. She also reports feeling bloated for the past few weeks.  She has upper respiratory symptoms including nasal congestion, sore throat, sinus pain, and a sensation of fluid behind her left ear. She suspects having had a fever, as indicated by a fever blister, but has not measured her temperature. No chills are reported.  No urinary symptoms such as urgency, frequency, burning, or painful urination are present.  She drove herself to the appointment despite feeling unwell and dizzy, as she lives nearby.    Patient Active Problem List   Diagnosis Date Noted   Dizziness 02/15/2024   Acute recurrent maxillary sinusitis 11/28/2023    Uncontrolled type 2 diabetes mellitus with hyperglycemia (HCC) 11/28/2023   Postoperative breast asymmetry 07/31/2023   Gastroparesis due to secondary diabetes (HCC) 07/24/2023   Other specified diabetes mellitus with unspecified diabetic retinopathy without macular edema (HCC) 11/07/2022   Thrombocytopenia (HCC) 09/12/2022   Adenomatous polyp of colon 06/21/2022   Acute cough 06/09/2022   Hemorrhagic disorder due to circulating anticoagulants (HCC) 05/28/2022   Cervical lymphadenitis 03/23/2022   Anemia 03/10/2022   DOE (dyspnea on exertion) 03/10/2022   Other fatigue 03/10/2022   Positive for microalbuminuria 03/10/2022   Colitis 03/08/2022   Depression, recurrent (HCC) 03/08/2022   Elevated brain natriuretic peptide (BNP) level 03/08/2022   Hypomagnesemia 03/08/2022   Aortic valve stenosis 03/08/2022   PVD (peripheral vascular disease) (HCC) 03/08/2022   Atherosclerosis of native artery of extremity with ulceration (HCC) 03/08/2022   Greater trochanteric pain syndrome of left lower extremity 11/23/2021   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 10/25/2021   Paroxysmal atrial fibrillation, post CABG (HCC) 10/25/2021   Neuropathy 09/30/2021   Cervical myelopathy (HCC) 11/15/2020   LVH (left ventricular hypertrophy) 10/14/2020   Diabetes mellitus with neuropathy (HCC) 04/20/2017   History of colonic polyps 08/20/2015   Ruptured right breast implant 05/18/2015   Anxiety disorder 04/26/2015   Allergic rhinitis 04/21/2015   Asthma 04/21/2015   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/21/2015   GERD (gastroesophageal reflux disease) 04/21/2015   Headache 04/21/2015   Vitamin B12 deficiency 04/21/2015   Acute on chronic heart failure with preserved ejection fraction (HFpEF) (HCC) 01/21/2013   Heart disease  01/21/2013   Nonrheumatic aortic valve stenosis 01/21/2013   Type 2 diabetes mellitus with hyperglycemia (HCC) 06/12/2006   Barrett esophagus 06/13/2003   Essential hypertension 06/12/1998   Genital herpes  06/12/1998   Hyperlipidemia, mixed 06/12/1998   Past Medical History:  Diagnosis Date   Anemia    Anxiety    Aortic stenosis 01/21/2013   s/p bioprosthetic TAVR 10/2021   Arthritis    Left Hip   Coronary artery disease    CABG 2023 x1   Diabetes mellitus without complication (HCC)    Diabetic neuropathy (HCC)    Diabetic retinopathy (HCC)    Frequent sinus infections    GERD (gastroesophageal reflux disease)    Headache    Tension headaches   Heart murmur    AVR 2023   History of Barrett's esophagus    History of colon polyps    Hyperchloremia    Hypertension    MRSA (methicillin resistant staph aureus) culture positive 2018   history of, in hand   Palpitations    Pneumonia    Hx   Seasonal allergies    Skin cancer    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   Past Surgical History:  Procedure Laterality Date   ANTERIOR CERVICAL DECOMP/DISCECTOMY FUSION N/A 11/15/2020   Procedure: ANTERIOR CERVICAL DECOMPRESSION/DISCECTOMY FUSION 1 LEVEL C3/4;  Surgeon: Bluford Standing, MD;  Location: [facility] ORS;  Service: Neurosurgery;  Laterality: N/A;   AORTIC VALVE REPLACEMENT N/A 09/19/2021   Procedure: AORTIC VALVE REPLACEMENT Using 19mm Edwards Resilia Inspiris Valve;  Surgeon: Lucas Dorise POUR, MD;  Location: [facility] OR;  Service: Open Heart Surgery;  Laterality: N/A;   AUGMENTATION MAMMAPLASTY Bilateral    breast implants 1980s   BIOPSY  12/18/2022   Procedure: BIOPSY;  Surgeon: Therisa Bi, MD;  Location: [facility] ENDOSCOPY;  Service: Gastroenterology;;   Bone Spur  2007   foot   BREAST IMPLANT REMOVAL Bilateral 10/30/2022   Procedure: Bilateral removal of breast implants with mastopexy;  Surgeon: Lowery Estefana RAMAN, DO;  Location: [facility] OR;  Service: Plastics;  Laterality: Bilateral;   CARDIAC CATHETERIZATION     Carpal Tunnel Symdrome  2008   as repeated in 2009   CATARACT EXTRACTION W/PHACO Left 01/16/2019   Procedure: CATARACT EXTRACTION PHACO AND INTRAOCULAR LENS PLACEMENT (IOC) LEFT  DIABETES VISION BLUE;  Surgeon: Ferol Rogue, MD;  Location: [facility] ORS;  Service: Ophthalmology;  Laterality: Left;  US   00:47 CDE 6.62 Fluid pack lot # [id number]   CATARACT EXTRACTION W/PHACO Right 02/06/2019   Procedure: CATARACT EXTRACTION PHACO AND INTRAOCULAR LENS PLACEMENT (IOC);  Surgeon: Ferol Rogue, MD;  Location: [facility] ORS;  Service: Ophthalmology;  Laterality: Right;  US  00:54.6 CDE 6.52 FLUID PACK LOT # [id number]   CHOLECYSTECTOMY  2007   COLONOSCOPY WITH PROPOFOL  N/A 11/17/2015   Procedure: COLONOSCOPY WITH PROPOFOL ;  Surgeon: Lamar ONEIDA Holmes, MD;  Location: [facility] ENDOSCOPY;  Service: Endoscopy;  Laterality: N/A;   COLONOSCOPY WITH PROPOFOL  N/A 06/21/2022   Procedure: COLONOSCOPY WITH PROPOFOL ;  Surgeon: Therisa Bi, MD;  Location: [facility] ENDOSCOPY;  Service: Gastroenterology;  Laterality: N/A;   CORONARY ARTERY BYPASS GRAFT N/A 09/19/2021   Procedure: CORONARY ARTERY BYPASS GRAFTING X1 Using Left Internal Mammory artery;  Surgeon: Lucas Dorise POUR, MD;  Location: [facility];  Service: Open Heart Surgery;  Laterality: N/A;   CORONARY PRESSURE/FFR STUDY N/A 05/24/2021   Procedure: INTRAVASCULAR PRESSURE WIRE/FFR STUDY;  Surgeon: Florencio Cara BIRCH, MD;  Location: [facility] INVASIVE CV LAB;  Service: Cardiovascular;  Laterality:  N/A;   ESOPHAGOGASTRODUODENOSCOPY (EGD) WITH PROPOFOL  N/A 11/17/2015   Procedure: ESOPHAGOGASTRODUODENOSCOPY (EGD) WITH PROPOFOL ;  Surgeon: Lamar ONEIDA Holmes, MD;  Location: [facility] ENDOSCOPY;  Service: Endoscopy;  Laterality: N/A;   ESOPHAGOGASTRODUODENOSCOPY (EGD) WITH PROPOFOL  N/A 12/18/2022   Procedure: ESOPHAGOGASTRODUODENOSCOPY (EGD) WITH PROPOFOL ;  Surgeon: Therisa Bi, MD;  Location: [facility] ENDOSCOPY;  Service: Gastroenterology;  Laterality: N/A;   MANDIBLE SURGERY     MASTOPEXY Bilateral 10/30/2022   Procedure: MASTOPEXY;  Surgeon: Lowery Estefana RAMAN, DO;  Location: [facility] OR;  Service: Plastics;  Laterality: Bilateral;   NASAL SINUS SURGERY  03/2013   Multile surgery Dr.  Gretta. 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 Claudene   RIGHT/LEFT HEART CATH AND CORONARY ANGIOGRAPHY N/A 05/24/2021   Procedure: RIGHT/LEFT HEART CATH AND CORONARY ANGIOGRAPHY;  Surgeon: Florencio Cara BIRCH, MD;  Location: [facility] INVASIVE CV LAB;  Service: Cardiovascular;  Laterality: N/A;   TEE WITHOUT CARDIOVERSION N/A 09/19/2021   Procedure: TRANSESOPHAGEAL ECHOCARDIOGRAM (TEE);  Surgeon: Lucas Dorise POUR, MD;  Location[facility] OR;  Service: Open Heart Surgery;  Laterality: N/A;   TONSILLECTOMY  1960   Allergies  Allergen Reactions   Doxycycline Other (See Comments)    Emotional changes/anxious   Ozempic  (0.25 Or 0.5 Mg-Dose) [Semaglutide (0.25 Or 0.5mg -Dos)] Nausea And Vomiting   Trulicity  [Dulaglutide ] Nausea And Vomiting   Biaxin [Clarithromycin]     GI upset, and bad taste in mouth.   Jardiance  [Empagliflozin ] Other (See Comments)    Abdominal upset          11/28/2023    9:35 AM 07/24/2023   12:13 PM 05/01/2023    1:09 PM  Depression screen PHQ 2/9  Decreased Interest 1 0 0  Down, Depressed, Hopeless 1 1 0  PHQ - 2 Score 2 1 0  Altered sleeping 0 0   Tired, decreased energy 1 1   Change in appetite 0 1   Feeling bad or failure about yourself  0 0   Trouble concentrating 0 0   Moving slowly or fidgety/restless 1 0   Suicidal thoughts 0 0   PHQ-9 Score 4 3   Difficult doing work/chores Somewhat difficult Not difficult at all        11/28/2023    9:36 AM 07/24/2023   12:14 PM 09/01/2022    9:12 AM 08/16/2022    9:36 AM  GAD 7 : Generalized Anxiety Score  Nervous, Anxious, on Edge 1 1 0 1  Control/stop worrying 0 0 1 2  Worry too much - different things 0 1 2 1   Trouble relaxing 1 1 2 2   Restless 1 0 0 0  Easily annoyed or irritable 2 1 3 2   Afraid - awful might happen 0 0 1 1  Total GAD 7 Score 5 4 9 9   Anxiety Difficulty Very difficult Not difficult at all Somewhat difficult Somewhat difficult      Review of Systems   Constitutional:  Positive for malaise/fatigue. Negative for chills and fever.  HENT:  Positive for congestion, sinus pain and sore throat. Negative for ear pain.   Respiratory:  Positive for cough.   Gastrointestinal:  Positive for diarrhea, nausea and vomiting.  Neurological:  Positive for dizziness and headaches.      Objective:     BP (!) 130/50   Pulse 61   Temp 98.3 F (36.8 C) (Oral)   Ht 5' (1.524 m)   Wt 146 lb 9.6 oz (66.5 kg)   SpO2  95%   BMI 28.63 kg/m  BP Readings from Last 3 Encounters:  02/15/24 (!) 130/50  11/28/23 132/70  11/26/23 (!) 198/65   Wt Readings from Last 3 Encounters:  02/15/24 146 lb 9.6 oz (66.5 kg)  11/28/23 139 lb 3.2 oz (63.1 kg)  11/26/23 139 lb (63 kg)      Physical Exam Vitals and nursing note reviewed.  Constitutional:      Appearance: She is ill-appearing.  HENT:     Right Ear: Tympanic membrane, ear canal and external ear normal.     Left Ear: Tympanic membrane is erythematous and retracted.     Nose: Congestion present.     Mouth/Throat:     Mouth: Mucous membranes are moist.     Pharynx: Oropharynx is clear.  Cardiovascular:     Rate and Rhythm: Normal rate and regular rhythm.     Pulses: Normal pulses.     Heart sounds: Normal heart sounds.  Pulmonary:     Effort: Pulmonary effort is normal.     Breath sounds: Normal breath sounds.  Neurological:     Mental Status: She is alert and oriented to person, place, and time.  Psychiatric:        Mood and Affect: Mood normal.        Behavior: Behavior normal.        Thought Content: Thought content normal.        Judgment: Judgment normal.      Results for orders placed or performed in visit on 02/15/24  POC COVID-19  Result Value Ref Range   SARS Coronavirus 2 Ag Negative Negative  POCT Influenza A/B  Result Value Ref Range   Influenza A, POC Negative Negative   Influenza B, POC Negative Negative       The 10-year ASCVD risk score (Arnett DK, et al., 2019)  is: 26.7%    Assessment & Plan:  Acute cough Assessment & Plan: POC covid and flu negative today.   Left ear TM retracted.   Given the length of symptoms and presentation today, 7 falls in the last 5 days, will sent patient to the ER.   Orders: -     POC COVID-19 BinaxNow -     POCT Influenza A/B  Dizziness Assessment & Plan: Patient unstable during exam.  Fatigued and unable to keep eyes open during exam.   Given the length of symptoms and presentation today, 7 falls in the last 5 days, will sent patient to the ER.       Return if symptoms worsen or fail to improve.    Carrol Aurora, NP

## 2024-02-15 NOTE — Discharge Instructions (Signed)
 You were seen in the emergency department for frequent falls cough and sore throat.  Workup today demonstrated that you did have a pneumonia and you were given antibiotics.  Your next dose of antibiotics will be due tomorrow morning.  You were worked up for possible stroke which you do not have such however you do have evidence of very small, chronic microhemorrhages in your cerebellum which may be contributing to your balance issues.  Please do not ambulate without a walker.  Please call your primary care physician on Monday and determine whether further blood pressure control may be necessary.  Return with any acutely worsening symptoms or any other emergency.  It was very nice meeting you and I wish you the best of luck with everything. -- RETURN PRECAUTIONS & AFTERCARE: (ENGLISH) RETURN PRECAUTIONS: Return immediately to the emergency department or see/call your doctor if you feel worse, weak or have changes in speech or vision, are short of breath, have fever, vomiting, pain, bleeding or dark stool, trouble urinating or any new issues. Return here or see/call your doctor if not improving as expected for your suspected condition. FOLLOW-UP CARE: Call your doctor and/or any doctors we referred you to for more advice and to make an appointment. Do this today, tomorrow or after the weekend. Some doctors only take PPO insurance so if you have HMO insurance you may want to contact your HMO or your regular doctor for referral to a specialist within your plan. Either way tell the doctor's office that it was a referral from the emergency department so you get the soonest possible appointment.  YOUR TEST RESULTS: Take result reports of any blood or urine tests, imaging tests and EKG's to your doctor and any referral doctor. Have any abnormal tests repeated. Your doctor or a referral doctor can let you know when this should be done. Also make sure your doctor contacts this hospital to get any test results that are  not currently available such as cultures or special tests for infection and final imaging reports, which are often not available at the time you leave the ER but which may list additional important findings that are not documented on the preliminary report. BLOOD PRESSURE: If your blood pressure was greater than 120/80 have your blood pressure rechecked within 1 to 2 weeks. MEDICATION SIDE EFFECTS: Do not drive, walk, bike, take the bus, etc. if you have received or are being prescribed any sedating medications such as those for pain or anxiety or certain antihistamines like Benadryl . If you have been give one of these here get a taxi home or have a friend drive you home. Ask your pharmacist to counsel you on potential side effects of any new medication

## 2024-02-15 NOTE — Assessment & Plan Note (Signed)
 POC covid and flu negative today.   Left ear TM retracted.   Given the length of symptoms and presentation today, 7 falls in the last 5 days, will sent patient to the ER.

## 2024-02-15 NOTE — Telephone Encounter (Signed)
 I called the patient to explain to her that we need her A1C to be lower, around 7-8 before we can proceed with surgery. She is going to an appointment today and will get it checked. I told her to call us  when she has the results and we can work towards getting her scheduled. Patient conveyed understanding.

## 2024-02-15 NOTE — Patient Instructions (Signed)
 Please go to the ER for further evaluation

## 2024-02-15 NOTE — ED Triage Notes (Signed)
 Pt comes with c/o weakness, nausea and multiple falls recently. Pt states no cp or sob.  Pt stats she thinks one of the falls she passed out. Pt doesn't recall hitting head.

## 2024-02-15 NOTE — ED Provider Notes (Signed)
 [facility] Provider Note    Event Date/Time   First MD Initiated Contact with Patient 02/15/24 1644     (approximate)   History   Weakness   HPI  Katie Buckley is a 73 y.o. female hypertension, type 2 diabetes, CAD, PVD, CHF who presents to the emergency department with cough for several days and lightheadedness.  Patient reports that she normally ambulates with a walker but has not been using this frequently.  She started falling many months ago however it became acutely worse this week after returning from a trip to the beach.  She states that she suddenly felt unwell with cough sore throat and nasal congestion.  She states that she has fallen over 6 times over the past several days.  She denies any loss of consciousness during these episodes.  She denies any lightheadedness or dizziness but reports that following tends to occur on the right side when she loses her balance..  She denies any sensation changes or extremity weakness.  She presents with her husband who reports that she is at her baseline mental status.  She saw her primary care physician today who counseled her to come to the emergency department.      Physical Exam   Triage Vital Signs: ED Triage Vitals  Encounter Vitals Group     BP 02/15/24 1300 (!) 138/50     Girls Systolic BP Percentile --      Girls Diastolic BP Percentile --      Boys Systolic BP Percentile --      Boys Diastolic BP Percentile --      Pulse Rate 02/15/24 1300 (!) 55     Resp 02/15/24 1300 18     Temp 02/15/24 1300 98 F (36.7 C)     Temp src --      SpO2 02/15/24 1300 98 %     Weight 02/15/24 1258 146 lb (66.2 kg)     Height 02/15/24 1258 5' (1.524 m)     Head Circumference --      Peak Flow --      Pain Score 02/15/24 1258 5     Pain Loc --      Pain Education --      Exclude from Growth Chart --     Most recent vital signs: Vitals:   02/15/24 2000 02/15/24 2030  BP: (!) 171/65 (!) 159/64  Pulse:  69 63  Resp: 16   Temp:    SpO2: 98% 92%    Nursing Triage Note reviewed. Vital signs reviewed and patients oxygen saturation is normoxic  General: Patient is well nourished, well developed, awake and alert, resting comfortably in no acute distress Head: Normocephalic and atraumatic Eyes: Normal inspection, extraocular muscles intact, no conjunctival pallor Ear, nose, throat: Normal external exam Neck: Normal range of motion Respiratory: Patient is in no respiratory distress, lungs CTAB Cardiovascular: Patient is not tachycardic, RR GI: Abd SNT with no guarding or rebound  Back: Normal inspection of the back with good strength and range of motion throughout all ext Extremities: pulses intact with good cap refills, no LE pitting edema or calf tenderness Neuro: The patient is alert and oriented to person, place, and time, appropriately conversive, with 5/5 bilat UE/LE strength, no gross motor or sensory defects noted. Coordination appears to be adequate.  Patient was able to walk without any ataxia, no dysmetria to finger-nose or rapid hand Skin: Patient does have an abrasion to her right elbow  Psych: normal mood and affect, no SI or HI  ED Results / Procedures / Treatments   Labs (all labs ordered are listed, but only abnormal results are displayed) Labs Reviewed  COMPREHENSIVE METABOLIC PANEL WITH GFR - Abnormal; Notable for the following components:      Result Value   Albumin  3.3 (*)    All other components within normal limits  CBC - Abnormal; Notable for the following components:   RBC 3.35 (*)    Hemoglobin 9.2 (*)    HCT 28.8 (*)    All other components within normal limits  URINALYSIS, ROUTINE W REFLEX MICROSCOPIC - Abnormal; Notable for the following components:   Color, Urine YELLOW (*)    APPearance CLEAR (*)    Glucose, UA 150 (*)    All other components within normal limits  RESP PANEL BY RT-PCR (RSV, FLU A&B, COVID)  [id number]  CBG MONITORING, ED     EKG EKG  and rhythm strip are interpreted by myself:   EKG: bradycardic sinus rhythm] at heart rate of 55, normal QRS duration, QTc 466, nonspecific ST segments and T waves no ectopy EKG not consistent with Acute STEMI Rhythm strip: bradycardic sinus rhythm in lead II   RADIOLOGY CT head without contrast: No acute abnormality on my independent review and interpretation and radiologist agrees CT C-spine: No acute fracture of Chest x-ray: Concerning for atypical pneumonia MRI brain without contrast: No acute intracranial abnormality/no CVA,   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, new from prior      PROCEDURES:  Critical Care performed: No  Procedures   MEDICATIONS ORDERED IN ED: Medications  azithromycin  (ZITHROMAX ) tablet 500 mg (500 mg Oral Given 02/15/24 2005)  ondansetron  (ZOFRAN -ODT) disintegrating tablet 4 mg (4 mg Oral Given 02/15/24 2005)  cefTRIAXone  (ROCEPHIN ) 1 g in sodium chloride  0.9 % 100 mL IVPB (0 g Intravenous Stopped 02/15/24 2040)  oxyCODONE -acetaminophen  (PERCOCET/ROXICET) 5-325 MG per tablet 1 tablet (1 tablet Oral Given 02/15/24 2005)     IMPRESSION / MDM / ASSESSMENT AND PLAN / ED COURSE                                Differential diagnosis includes, but is not limited to, intracranial hemorrhage, pneumonia, electrolyte derangement anemia, arrhythmia, cervical fracture, CVA  ED course: Patient presents without any focal neurological deficits.  Given her frequent falls a CT head without contrast was obtained which demonstrated no intracranial hemorrhage and C-spine was unremarkable as well.  Chest x-ray was consistent with pneumonia and she received ceftriaxone  and azithromycin .  COVID test was not positive.  Urinalysis demonstrated no UTI, she had no leukocytosis or profound electrolyte derangements.  Although she had an NIH stroke scale of 0 and was able to ambulate without ataxia, given her frequent falls especially to the  right I did make the decision to obtain an MRI of the brain which demonstrated no stroke but chronic microhemorrhages in the cerebellum.  Patient and has been counseled that this may be secondary to labile blood pressures and they were advised to contact your primary care physician to determine whether better blood pressure control was necessary.  They voiced understanding and willingness to do such.  Patient was offered boarder status in our emergency department for possible PT OT, home health or skilled nursing facility services but she and her husband would prefer to return home.  She was advised to  only ambulate with her walker which she has not been doing such previously.  She will return if any acutely worsening symptoms   Clinical Course as of 02/15/24 2355  Fri Feb 15, 2024  1830 DG Chest 2 View Consistent with pneumonia [HD]  2045 I reviewed the workup with the patient and counseled close blood pressure control, patient's blood pressure in the room is systolic of 140s.  I did offer the possibility of staying as an ED boarder for SNF placement, and patient has been voiced understanding but would prefer to return home.  They are encouraged to call the primary care physician first thing on Monday.  I will send them with antibiotics for pneumonia.  All questions answered and patient voiced understanding and requested discharge [HD]    Clinical Course User Index [HD] Nicholaus Rolland BRAVO, MD   At time of discharge there is no evidence of acute life, limb, vision, or fertility threat. Patient has stable vital signs, pain is well controlled, patient is ambulatory and p.o. tolerant.  Discharge instructions were completed using the [employer] system. I would refer you to those at this time. All warnings prescriptions follow-up etc. were discussed in detail with the patient. Patient indicates understanding and is agreeable with this plan. All questions answered.  Patient is made aware that they may return to  the emergency department for any worsening or new condition or for any other emergency.    FINAL CLINICAL IMPRESSION(S) / ED DIAGNOSES   Final diagnoses:  Pneumonia due to infectious organism, unspecified laterality, unspecified part of lung  Frequent falls     Rx / DC Orders   ED Discharge Orders          Ordered    azithromycin  (ZITHROMAX ) 500 MG tablet  Daily        02/15/24 2047    ondansetron  (ZOFRAN -ODT) 4 MG disintegrating tablet  Every 8 hours PRN        02/15/24 2047    cefdinir  (OMNICEF ) 300 MG capsule  2 times daily        02/15/24 2047             Note:  This document was prepared using Dragon voice recognition software and may include unintentional dictation errors.   Nicholaus Rolland BRAVO, MD 02/15/24 [phone]

## 2024-02-18 ENCOUNTER — Telehealth: Payer: Self-pay | Admitting: Family

## 2024-02-18 NOTE — Telephone Encounter (Signed)
 Mucinex  without DM would be a good option   If she gets too sob or congested though with trouble breathing back to ER.  Make a close f/u with me x one week

## 2024-02-18 NOTE — Telephone Encounter (Signed)
 Spoke with Katie Buckley. She was recently seen at the ED for congestion and pneumonia. Reports that she is having lots of congestion in her chest and sinuses. While in the ED she was prescribed azithromycin  and cefdinir . Katie Buckley has has 3 days of both medications. She would like to know what else she can take for the congestion. Katie Buckley would like to wait for Katie Buckley to address.

## 2024-02-18 NOTE — Telephone Encounter (Signed)
 Spoke with Katie Buckley. She is aware of [name]'s response. Katie Buckley already has an appointment scheduled on 02/26/24 with [name].

## 2024-02-18 NOTE — Telephone Encounter (Signed)
 Copied from CRM [id number]. Topic: Clinical - Medication Question >> Feb 18, 2024  2:27 PM Katie Buckley wrote: Reason for CRM: Pt stated that she was recently seen in the ER due to congestion and having pneumonia. Pt stated that she also is experiencing a dry cough and ear pain and would like for Katie Dugal,FNP, to prescribe some medication. Pt would like a callback with an update.

## 2024-02-19 ENCOUNTER — Inpatient Hospital Stay: Admission: RE | Admit: 2024-02-19 | Source: Ambulatory Visit

## 2024-02-25 ENCOUNTER — Encounter: Payer: Self-pay | Admitting: Internal Medicine

## 2024-02-25 ENCOUNTER — Ambulatory Visit: Admitting: Urology

## 2024-02-25 VITALS — BP 148/70 | HR 66 | Ht 60.0 in | Wt 145.2 lb

## 2024-02-25 DIAGNOSIS — R32 Unspecified urinary incontinence: Secondary | ICD-10-CM | POA: Diagnosis not present

## 2024-02-25 DIAGNOSIS — N3941 Urge incontinence: Secondary | ICD-10-CM

## 2024-02-25 LAB — MICROSCOPIC EXAMINATION

## 2024-02-25 LAB — URINALYSIS, COMPLETE
Bilirubin, UA: NEGATIVE
Glucose, UA: NEGATIVE
Ketones, UA: NEGATIVE
Leukocytes,UA: NEGATIVE
Nitrite, UA: NEGATIVE
Protein,UA: NEGATIVE
RBC, UA: NEGATIVE
Specific Gravity, UA: 1.02 (ref 1.005–1.030)
Urobilinogen, Ur: 0.2 mg/dL (ref 0.2–1.0)
pH, UA: 6 (ref 5.0–7.5)

## 2024-02-25 MED ORDER — GEMTESA 75 MG PO TABS: 75.0000 mg | ORAL_TABLET | Freq: Every day | ORAL | 3 refills | Status: AC

## 2024-02-25 NOTE — Progress Notes (Signed)
 02/25/2024 11:11 AM   Katie  J Buckley 1950/08/13 [id number]  Referring provider: Corwin Antu, FNP [address] [place],  [place] [address]  Chief Complaint  Patient presents with   Follow-up   Urinary Incontinence    HPI:  was consulted to assess the patient's urinary incontinence.  She has urge incontinence not wearing pads.  No stress incontinence.  No bedwetting   On further questioning she rarely leaks a few drops with a sneeze.  Although she does not wear a pad she has experienced high-volume episodes at work associated with urgency   She voids every 2-3 hours and gets up once or twice at night   She gets 1 bladder infection year.  No hysterectomy     Patient has mild urge incontinence.  Reassess with pelvic examination and cystoscopy in 6 weeks on Gemtesa  samples and prescription.  Physical therapy offered.  Physical therapy ordered.  Call if culture positive    Patient report being much better on the Gemtesa  samples but never filled the prescription On pelvic examination she grade 1 hypermobility the bladder neck and negative cough test before and after cystoscopy   Cystoscopy: Patient underwent flexible cystoscopy.  Bladder mucosa and trigone were normal.  No cystitis.  No carcinoma.  Well-tolerated    Reassess in 3 months on Gemtesa  with 2 weeks of samples and prescription. Proceed accordingly   Today Urge incontinence much better.  Frequency stable.  No infections.     PMH: Past Medical History:  Diagnosis Date   Anemia    Anxiety    Aortic stenosis 01/21/2013   s/p bioprosthetic TAVR 10/2021   Arthritis    Left Hip   Coronary artery disease    CABG 2023 x1   Diabetes mellitus without complication (HCC)    Diabetic neuropathy (HCC)    Diabetic retinopathy (HCC)    Frequent sinus infections    GERD (gastroesophageal reflux disease)    Headache    Tension headaches   Heart murmur    AVR 2023   History of Barrett's esophagus    History of  colon polyps    Hyperchloremia    Hypertension    MRSA (methicillin resistant staph aureus) culture positive 2018   history of, in hand   Palpitations    Pneumonia    Hx   Seasonal allergies    Skin cancer    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    Surgical History: Past Surgical History:  Procedure Laterality Date   ANTERIOR CERVICAL DECOMP/DISCECTOMY FUSION N/A 11/15/2020   Procedure: ANTERIOR CERVICAL DECOMPRESSION/DISCECTOMY FUSION 1 LEVEL C3/4;  Surgeon: Bluford Standing, MD;  Location: [facility] ORS;  Service: Neurosurgery;  Laterality: N/A;   AORTIC VALVE REPLACEMENT N/A 09/19/2021   Procedure: AORTIC VALVE REPLACEMENT Using 19mm Edwards Resilia Inspiris Valve;  Surgeon: Lucas Dorise POUR, MD;  Location: [facility];  Service: Open Heart Surgery;  Laterality: N/A;   AUGMENTATION MAMMAPLASTY Bilateral    breast implants 1980s   BIOPSY  12/18/2022   Procedure: BIOPSY;  Surgeon: Therisa Bi, MD;  Location: [facility] ENDOSCOPY;  Service: Gastroenterology;;   Bone Spur  2007   foot   BREAST IMPLANT REMOVAL Bilateral 10/30/2022   Procedure: Bilateral removal of breast implants with mastopexy;  Surgeon: Lowery Estefana RAMAN, DO;  Location: [facility] OR;  Service: Plastics;  Laterality: Bilateral;   CARDIAC CATHETERIZATION     Carpal Tunnel Symdrome  2008   as repeated in 2009   CATARACT EXTRACTION  W/PHACO Left 01/16/2019   Procedure: CATARACT EXTRACTION PHACO AND INTRAOCULAR LENS PLACEMENT (IOC) LEFT DIABETES VISION BLUE;  Surgeon: Ferol Rogue, MD;  Location: [facility] ORS;  Service: Ophthalmology;  Laterality: Left;  US   00:47 CDE 6.62 Fluid pack lot # [id number]   CATARACT EXTRACTION W/PHACO Right 02/06/2019   Procedure: CATARACT EXTRACTION PHACO AND INTRAOCULAR LENS PLACEMENT (IOC);  Surgeon: Ferol Rogue, MD;  Location: [facility] ORS;  Service: Ophthalmology;  Laterality: Right;  US  00:54.6 CDE 6.52 FLUID PACK LOT # [id number]   CHOLECYSTECTOMY  2007   COLONOSCOPY WITH PROPOFOL  N/A 11/17/2015   Procedure:  COLONOSCOPY WITH PROPOFOL ;  Surgeon: Lamar ONEIDA Holmes, MD;  Location: [facility] ENDOSCOPY;  Service: Endoscopy;  Laterality: N/A;   COLONOSCOPY WITH PROPOFOL  N/A 06/21/2022   Procedure: COLONOSCOPY WITH PROPOFOL ;  Surgeon: Therisa Bi, MD;  Location: [facility]COPY;  Service: Gastroenterology;  Laterality: N/A;   CORONARY ARTERY BYPASS GRAFT N/A 09/19/2021   Procedure: CORONARY ARTERY BYPASS GRAFTING X1 Using Left Internal Mammory artery;  Surgeon: Lucas Dorise POUR, MD;  [facility];  Service: Open Heart Surgery;  Laterality: N/A;   CORONARY PRESSURE/FFR STUDY N/A 05/24/2021   Procedure: INTRAVASCULAR PRESSURE WIRE/FFR STUDY;  Surgeon: Florencio Cara BIRCH, MD;  Location: [facility] INVASIVE CV LAB;  Service: Cardiovascular;  Laterality: N/A;   ESOPHAGOGASTRODUODENOSCOPY (EGD) WITH PROPOFOL  N/A 11/17/2015   Procedure: ESOPHAGOGASTRODUODENOSCOPY (EGD) WITH PROPOFOL ;  Surgeon: Lamar ONEIDA Holmes, MD;  Location: [facility] ENDOSCOPY;  Service: Endoscopy;  Laterality: N/A;   ESOPHAGOGASTRODUODENOSCOPY (EGD) WITH PROPOFOL  N/A 12/18/2022   Procedure: ESOPHAGOGASTRODUODENOSCOPY (EGD) WITH PROPOFOL ;  Surgeon: Therisa Bi, MD;  Location: [facility] ENDOSCOPY;  Service: Gastroenterology;  Laterality: N/A;   MANDIBLE SURGERY     MASTOPEXY Bilateral 10/30/2022   Procedure: MASTOPEXY;  Surgeon: Lowery Estefana RAMAN, DO;  Location: [facility] OR;  Service: Plastics;  Laterality: Bilateral;   NASAL SINUS SURGERY  03/2013   Multile surgery Dr. Gretta. 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 Claudene   RIGHT/LEFT HEART CATH AND CORONARY ANGIOGRAPHY N/A 05/24/2021   Procedure: RIGHT/LEFT HEART CATH AND CORONARY ANGIOGRAPHY;  Surgeon: Florencio Cara BIRCH, MD;  Location: [facility] INVASIVE CV LAB;  Service: Cardiovascular;  Laterality: N/A;   TEE WITHOUT CARDIOVERSION N/A 09/19/2021   Procedure: TRANSESOPHAGEAL ECHOCARDIOGRAM (TEE);  Surgeon: Lucas Dorise POUR, MD;  Location[facility] OR;  Service: Open Heart Surgery;   Laterality: N/A;   TONSILLECTOMY  1960    Home Medications:  Allergies as of 02/25/2024       Reactions   Ozempic  (0.25 Or 0.5 Mg-dose) [semaglutide (0.25 Or 0.5mg -dos)] Nausea And Vomiting   Trulicity  [dulaglutide ] Nausea And Vomiting   Biaxin [clarithromycin]    GI upset, and bad taste in mouth.   Jardiance  [empagliflozin ] Other (See Comments)   Abdominal upset         Medication List        Accurate as of February 25, 2024 11:11 AM. If you have any questions, ask your nurse or doctor.          STOP taking these medications    amoxicillin -clavulanate 875-125 MG tablet Commonly known as: AUGMENTIN  Stopped by: Katie A Persis Graffius       TAKE these medications    acetaminophen  650 MG CR tablet Commonly known as: TYLENOL  Take 1,300 mg by mouth every 8 (eight) hours as needed for pain (pain).   amLODipine  5 MG tablet Commonly known as: NORVASC  Take 1 tablet (5 mg total) by mouth daily.   apixaban  5  MG Tabs tablet Commonly known as: ELIQUIS  Take 1 tablet (5 mg total) by mouth 2 (two) times daily.   buPROPion  150 MG 12 hr tablet Commonly known as: WELLBUTRIN  SR Take 150 mg by mouth 2 (two) times daily.   butalbital -acetaminophen -caffeine  50-325-40 MG tablet Commonly known as: FIORICET TAKE 1 TABLET BY MOUTH EVERY 6 HOURS AS NEEDED FOR HEADACHE.   Contour Next Monitor w/Device Kit Use to check blood sugar daily   Contour Next Test test strip Generic drug: glucose blood Use to check blood sugar once daily   ezetimibe  10 MG tablet Commonly known as: ZETIA  Take 1 tablet (10 mg total) by mouth at bedtime.   FLUoxetine 40 MG capsule Commonly known as: PROZAC Take 40 mg by mouth daily.   fluticasone  50 MCG/ACT nasal spray Commonly known as: FLONASE  Place 1 spray into both nostrils daily as needed for allergies or rhinitis.   FreeStyle Libre 3 Plus Sensor Misc Use to check blood sugar continuously. Change sensor every 15 days.   Gemtesa  75 MG  Tabs Generic drug: Vibegron  Take 1 tablet (75 mg total) by mouth daily.   glipiZIDE  2.5 MG 24 hr tablet Commonly known as: GLUCOTROL  XL Take 1 tablet PO each morning x1 week then may increase to 2 tabs qam as instructed .   irbesartan  75 MG tablet Commonly known as: AVAPRO  Take 1 tablet (75 mg total) by mouth daily.   Lancet Devices Misc   losartan 50 MG tablet Commonly known as: COZAAR Take 50 mg by mouth daily.   metFORMIN  500 MG 24 hr tablet Commonly known as: GLUCOPHAGE -XR TAKE 2 TABLETS BY MOUTH EVERY DAY   metoprolol  tartrate 50 MG tablet Commonly known as: LOPRESSOR  Take 1 tablet (50 mg total) by mouth 2 (two) times daily.   ondansetron  4 MG disintegrating tablet Commonly known as: ZOFRAN -ODT Take 1 tablet (4 mg total) by mouth every 8 (eight) hours as needed for nausea or vomiting.   pregabalin  75 MG capsule Commonly known as: LYRICA  TAKE 1 CAPSULE BY MOUTH TWICE A DAY   RABEprazole  20 MG tablet Commonly known as: ACIPHEX  Take 1 tablet (20 mg total) by mouth 2 times daily at 12 noon and 4 pm.   rosuvastatin  20 MG tablet Commonly known as: CRESTOR  TAKE 1 TABLET BY MOUTH EVERY DAY IN THE EVENING   traZODone  50 MG tablet Commonly known as: DESYREL  Take 50-100 mg by mouth at bedtime.   Tresiba  FlexTouch 100 UNIT/ML FlexTouch Pen Generic drug: insulin  degludec INJECT 10 UNITS SUBCUTANEOUSLY AS DIRECTED & INCREASE ONLY AS INSTRUCTED TO MAX DOSE OF 40 UNITS        Allergies:  Allergies  Allergen Reactions   Ozempic  (0.25 Or 0.5 Mg-Dose) [Semaglutide (0.25 Or 0.5mg -Dos)] Nausea And Vomiting   Trulicity  [Dulaglutide ] Nausea And Vomiting   Biaxin [Clarithromycin]     GI upset, and bad taste in mouth.   Jardiance  [Empagliflozin ] Other (See Comments)    Abdominal upset     Family History: Family History  Problem Relation Age of Onset   Lung cancer Mother    Diabetes Father    Irritable bowel syndrome Sister    Throat cancer Brother    Breast cancer  Daughter    Breast cancer Maternal Aunt     Social History:  reports that she has never smoked. She has never been exposed to tobacco smoke. She has never used smokeless tobacco. She reports that she does not drink alcohol and does not use drugs.  ROS:  Physical Exam: BP (!) 148/70 (BP Location: Right Arm, Patient Position: Sitting, Cuff Size: Normal)   Pulse 66   Ht 5' (1.524 m)   Wt 65.9 kg   SpO2 92%   BMI 28.36 kg/m   Constitutional:  Alert and oriented, No acute distress. HEENT: [place] AT, moist mucus membranes.  Trachea midline, no masses. Cardiovascular: No clubbing, cyanosis, or edema.   Laboratory Data: Lab Results  Component Value Date   WBC 6.6 02/15/2024   HGB 9.2 (L) 02/15/2024   HCT 28.8 (L) 02/15/2024   MCV 86.0 02/15/2024   PLT 179 02/15/2024    Lab Results  Component Value Date   CREATININE 0.80 02/15/2024    No results found for: PSA  No results found for: TESTOSTERONE  Lab Results  Component Value Date   HGBA1C 9.7 (A) 11/28/2023    Urinalysis    Component Value Date/Time   COLORURINE YELLOW (A) 02/15/2024 1954   APPEARANCEUR CLEAR (A) 02/15/2024 1954   APPEARANCEUR Clear 11/26/2023 1126   LABSPEC 1.012 02/15/2024 1954   PHURINE 5.0 02/15/2024 1954   GLUCOSEU 150 (A) 02/15/2024 1954   HGBUR NEGATIVE 02/15/2024 1954   BILIRUBINUR NEGATIVE 02/15/2024 1954   BILIRUBINUR Negative 11/26/2023 1126   KETONESUR NEGATIVE 02/15/2024 1954   PROTEINUR NEGATIVE 02/15/2024 1954   UROBILINOGEN 0.2 05/18/2021 1415   NITRITE NEGATIVE 02/15/2024 1954   LEUKOCYTESUR NEGATIVE 02/15/2024 1954    Pertinent Imaging:   Assessment & Plan: 90 x 3 Gemtesa  sent to pharmacy and I will see in 1 year  1. Urinary incontinence, unspecified type (Primary)  - Urinalysis, Complete  2. Urgency incontinence    No follow-ups on file.  Katie DELENA Elizabeth, MD  [facility] [address] [place], [place] [address] [phone]

## 2024-02-25 NOTE — Addendum Note (Signed)
 Addended byBETHA CORIE PLATER on: 02/25/2024 11:22 AM   Modules accepted: Orders

## 2024-02-26 ENCOUNTER — Ambulatory Visit: Admit: 2024-02-26 | Admitting: Orthopedic Surgery

## 2024-02-26 ENCOUNTER — Ambulatory Visit: Admitting: Family

## 2024-02-26 ENCOUNTER — Encounter: Payer: Self-pay | Admitting: Family

## 2024-02-26 ENCOUNTER — Ambulatory Visit: Payer: Self-pay | Admitting: Family

## 2024-02-26 VITALS — BP 118/72 | HR 80 | Temp 98.7°F | Ht 60.0 in | Wt 144.6 lb

## 2024-02-26 DIAGNOSIS — I998 Other disorder of circulatory system: Secondary | ICD-10-CM

## 2024-02-26 DIAGNOSIS — G4452 New daily persistent headache (NDPH): Secondary | ICD-10-CM | POA: Diagnosis not present

## 2024-02-26 DIAGNOSIS — D649 Anemia, unspecified: Secondary | ICD-10-CM

## 2024-02-26 DIAGNOSIS — R2681 Unsteadiness on feet: Secondary | ICD-10-CM | POA: Diagnosis not present

## 2024-02-26 DIAGNOSIS — R9082 White matter disease, unspecified: Secondary | ICD-10-CM | POA: Diagnosis not present

## 2024-02-26 DIAGNOSIS — R9089 Other abnormal findings on diagnostic imaging of central nervous system: Secondary | ICD-10-CM

## 2024-02-26 DIAGNOSIS — J189 Pneumonia, unspecified organism: Secondary | ICD-10-CM

## 2024-02-26 DIAGNOSIS — I10 Essential (primary) hypertension: Secondary | ICD-10-CM

## 2024-02-26 DIAGNOSIS — J0100 Acute maxillary sinusitis, unspecified: Secondary | ICD-10-CM | POA: Diagnosis not present

## 2024-02-26 DIAGNOSIS — E1165 Type 2 diabetes mellitus with hyperglycemia: Secondary | ICD-10-CM

## 2024-02-26 DIAGNOSIS — R531 Weakness: Secondary | ICD-10-CM

## 2024-02-26 DIAGNOSIS — E1140 Type 2 diabetes mellitus with diabetic neuropathy, unspecified: Secondary | ICD-10-CM

## 2024-02-26 LAB — BASIC METABOLIC PANEL WITH GFR
BUN: 14 mg/dL (ref 6–23)
CO2: 31 meq/L (ref 19–32)
Calcium: 9.5 mg/dL (ref 8.4–10.5)
Chloride: 106 meq/L (ref 96–112)
Creatinine, Ser: 0.82 mg/dL (ref 0.40–1.20)
GFR: 71.4 mL/min (ref >60.00)
Glucose, Bld: 148 mg/dL — ABNORMAL HIGH (ref 70–99)
Potassium: 4.7 meq/L (ref 3.5–5.1)
Sodium: 135 meq/L (ref 135–145)

## 2024-02-26 LAB — IBC + FERRITIN
Ferritin: 58 ng/mL (ref 10.0–291.0)
Iron: 48 ug/dL (ref 42–145)
Saturation Ratios: 11.5 % — ABNORMAL LOW (ref 20.0–50.0)
TIBC: 415.8 ug/dL (ref 250.0–450.0)
Transferrin: 297 mg/dL (ref 212.0–360.0)

## 2024-02-26 LAB — CBC
HCT: 32.2 % — ABNORMAL LOW (ref 36.0–46.0)
Hemoglobin: 10.5 g/dL — ABNORMAL LOW (ref 12.0–15.0)
MCHC: 32.6 g/dL (ref 30.0–36.0)
MCV: 83.3 fl (ref 78.0–100.0)
Platelets: 182 K/uL (ref 150.0–400.0)
RBC: 3.87 Mil/uL (ref 3.87–5.11)
RDW: 14.3 % (ref 11.5–15.5)
WBC: 6.1 K/uL (ref 4.0–10.5)

## 2024-02-26 LAB — C-REACTIVE PROTEIN: CRP: <1 mg/dL (ref 0.5–20.0)

## 2024-02-26 LAB — SEDIMENTATION RATE: Sed Rate: 10 mm/h (ref 0–30)

## 2024-02-26 LAB — TSH: TSH: 0.88 u[IU]/mL (ref 0.35–5.50)

## 2024-02-26 LAB — HEMOGLOBIN A1C: Hgb A1c MFr Bld: 6.5 % (ref 4.6–6.5)

## 2024-02-26 SURGERY — ARTHROPLASTY, SHOULDER, TOTAL, REVERSE
Anesthesia: Choice | Site: Shoulder | Laterality: Left

## 2024-02-26 MED ORDER — IRBESARTAN 150 MG PO TABS: 150.0000 mg | ORAL_TABLET | Freq: Every day | ORAL | 0 refills | Status: DC

## 2024-02-26 MED ORDER — DOXYCYCLINE HYCLATE 100 MG PO TABS: 100.0000 mg | ORAL_TABLET | Freq: Two times a day (BID) | ORAL | 0 refills | Status: AC

## 2024-02-26 NOTE — Patient Instructions (Signed)
  Go home and check if you are taking irbesartan  and losartan.  If you are taking

## 2024-02-26 NOTE — Progress Notes (Unsigned)
 Established Patient Office Visit  Subjective:      CC:  Chief Complaint  Patient presents with   Follow-up    HPI: Katie  J Buckley is a 73 y.o. female presenting on 02/26/2024 for Follow-up .  Discussed the use of AI scribe software for clinical note transcription with the patient, who gave verbal consent to proceed.  History of Present Illness Katie  CELICIA Buckley is a 73 year old female with hypertension and chronic microvascular ischemic changes who presents with persistent headaches and sinus pressure.  She has been experiencing persistent headaches and sinus pressure for a few weeks, which began after a visit to the beach. The headaches are dull and achy, accompanied by sinus pressure and ear discomfort. She also experiences 'fuzziness' in her vision and sharp eye irritation. The headaches occur daily and are persistent. She frequently experiences chills but has not had a fever.  She has a history of falls, having fallen seven times, which led to an ER visit on September 5th where she was diagnosed with pneumonia. A CT scan of the head and neck showed no acute abnormalities, but patchy opacities in the right upper lung were noted. She reports that she was treated with azithromycin  and a Rocephin  injection. Despite completing the antibiotics, she continues to feel weak and tired.  Her past medical history includes hypertension, which has been difficult to control, with blood pressure readings fluctuating from 130/80s to as high as 159/84. She is on irbesartan  75 mg and losartan 50 mg, though she reports taking both medications, which are in the same class. She also takes Eliquis . 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  She reports a history of anemia, with a recent CBC showing she is heavily anemic. She continues to feel tired and weak, which impacts her ability to perform physical activities. No fever but  experiences chills and fatigue.  She is experiencing sinus congestion and pressure, which may be contributing to her headaches and ear discomfort. Her ears are 'sloshing' and she has been sneezing occasionally.         Social history:  Relevant past medical, surgical, family and social history reviewed and updated as indicated. Interim medical history since our last visit reviewed.  Allergies and medications reviewed and updated.  DATA REVIEWED: CHART IN EPIC     ROS: Negative unless specifically indicated above in HPI.    Current Outpatient Medications:    acetaminophen  (TYLENOL ) 650 MG CR tablet, Take 1,300 mg by mouth every 8 (eight) hours as needed for pain (pain)., Disp: , Rfl:    amLODipine  (NORVASC ) 5 MG tablet, Take 1 tablet (5 mg total) by mouth daily., Disp: 30 tablet, Rfl: 1   apixaban  (ELIQUIS ) 5 MG TABS tablet, Take 1 tablet (5 mg total) by mouth 2 (two) times daily., Disp: 60 tablet, Rfl: 2   Blood Glucose Monitoring Suppl (CONTOUR NEXT MONITOR) w/Device KIT, Use to check blood sugar daily, Disp: 1 kit, Rfl: 0   buPROPion  (WELLBUTRIN  SR) 150 MG 12 hr tablet, Take 150 mg by mouth 2 (two) times daily., Disp: , Rfl:    butalbital -acetaminophen -caffeine  (FIORICET) 50-325-40 MG tablet, TAKE 1 TABLET BY MOUTH EVERY 6 HOURS AS NEEDED FOR HEADACHE., Disp: 30 tablet, Rfl: 4   Continuous Glucose Sensor (FREESTYLE LIBRE 3 PLUS SENSOR) MISC, Use to check blood sugar continuously. Change sensor every 15 days., Disp: 6 each, Rfl: 1   doxycycline  (VIBRA -TABS) 100 MG tablet, Take 1  tablet (100 mg total) by mouth 2 (two) times daily for 10 days., Disp: 20 tablet, Rfl: 0   ezetimibe  (ZETIA ) 10 MG tablet, Take 1 tablet (10 mg total) by mouth at bedtime., Disp: 90 tablet, Rfl: 3   FLUoxetine (PROZAC) 40 MG capsule, Take 40 mg by mouth daily., Disp: , Rfl:    fluticasone  (FLONASE ) 50 MCG/ACT nasal spray, Place 1 spray into both nostrils daily as needed for allergies or rhinitis., Disp: ,  Rfl:    glipiZIDE  (GLUCOTROL  XL) 2.5 MG 24 hr tablet, Take 1 tablet PO each morning x1 week then may increase to 2 tabs qam as instructed ., Disp: 180 tablet, Rfl: 0   glucose blood (CONTOUR NEXT TEST) test strip, Use to check blood sugar once daily, Disp: 100 each, Rfl: 3   insulin  degludec (TRESIBA  FLEXTOUCH) 100 UNIT/ML FlexTouch Pen, INJECT 10 UNITS SUBCUTANEOUSLY AS DIRECTED & INCREASE ONLY AS INSTRUCTED TO MAX DOSE OF 40 UNITS, Disp: 15 mL, Rfl: 1   irbesartan  (AVAPRO ) 150 MG tablet, Take 1 tablet (150 mg total) by mouth daily., Disp: 90 tablet, Rfl: 0   Lancet Devices MISC, , Disp: , Rfl:    metFORMIN  (GLUCOPHAGE -XR) 500 MG 24 hr tablet, TAKE 2 TABLETS BY MOUTH EVERY DAY, Disp: 180 tablet, Rfl: 3   metoprolol  tartrate (LOPRESSOR ) 50 MG tablet, Take 1 tablet (50 mg total) by mouth 2 (two) times daily., Disp: 60 tablet, Rfl: 2   ondansetron  (ZOFRAN -ODT) 4 MG disintegrating tablet, Take 1 tablet (4 mg total) by mouth every 8 (eight) hours as needed for nausea or vomiting., Disp: 20 tablet, Rfl: 0   pregabalin  (LYRICA ) 75 MG capsule, TAKE 1 CAPSULE BY MOUTH TWICE A DAY, Disp: 60 capsule, Rfl: 2   RABEprazole  (ACIPHEX ) 20 MG tablet, Take 1 tablet (20 mg total) by mouth 2 times daily at 12 noon and 4 pm., Disp: 180 tablet, Rfl: 3   rosuvastatin  (CRESTOR ) 20 MG tablet, TAKE 1 TABLET BY MOUTH EVERY DAY IN THE EVENING, Disp: 90 tablet, Rfl: 1   traZODone  (DESYREL ) 50 MG tablet, Take 50-100 mg by mouth at bedtime., Disp: , Rfl:    Vibegron  (GEMTESA ) 75 MG TABS, Take 1 tablet (75 mg total) by mouth daily., Disp: 90 tablet, Rfl: 3        Objective:        BP 118/72 (BP Location: Left Arm, Patient Position: Sitting, Cuff Size: Normal)   Pulse 80   Temp 98.7 F (37.1 C) (Temporal)   Ht 5' (1.524 m)   Wt 144 lb 9.6 oz (65.6 kg)   SpO2 98%   BMI 28.24 kg/m   Physical Exam VITALS: BP- 118/72 GENERAL: Clammy appearance HEENT: Left sinus tenderness, ear pain NECK: Cervical  lymphadenopathy CHEST: Right upper lobe pneumonia NEUROLOGICAL: Cranial nerves II-VII intact  Wt Readings from Last 3 Encounters:  02/26/24 144 lb 9.6 oz (65.6 kg)  02/25/24 145 lb 3.2 oz (65.9 kg)  02/15/24 146 lb (66.2 kg)    Physical Exam Constitutional:      General: She is not in acute distress.    Appearance: Normal appearance. She is normal weight. She is not ill-appearing, toxic-appearing or diaphoretic.  HENT:     Head: Normocephalic.     Right Ear: Tympanic membrane normal.     Left Ear: Tympanic membrane normal.     Nose: Congestion and rhinorrhea present.     Left Turbinates: Swollen.     Right Sinus: Maxillary sinus tenderness present.  Left Sinus: Maxillary sinus tenderness present.     Mouth/Throat:     Mouth: Mucous membranes are dry.     Pharynx: Postnasal drip present. No oropharyngeal exudate or posterior oropharyngeal erythema.  Eyes:     Extraocular Movements: Extraocular movements intact.     Pupils: Pupils are equal, round, and reactive to light.  Cardiovascular:     Rate and Rhythm: Normal rate and regular rhythm.     Pulses: Normal pulses.     Heart sounds: Normal heart sounds.  Pulmonary:     Effort: Pulmonary effort is normal.     Breath sounds: Normal breath sounds.  Musculoskeletal:     Cervical back: Normal range of motion.  Neurological:     General: No focal deficit present.     Mental Status: She is alert and oriented to person, place, and time. Mental status is at baseline.     Cranial Nerves: Cranial nerves 2-12 are intact. No cranial nerve deficit or facial asymmetry.     Coordination: Coordination is intact. Finger-Nose-Finger Test normal. Rapid alternating movements normal.     Gait: Gait abnormal (unsteady).  Psychiatric:        Mood and Affect: Mood normal.        Behavior: Behavior normal.        Thought Content: Thought content normal.        Judgment: Judgment normal.          Results LABS CBC: White blood cells  normal, significant anemia (02/15/2024) Metabolic panel: Hypoalbuminemia (02/15/2024)  RADIOLOGY Head CT: No acute abnormality (02/15/2024) Neck CT: No acute fracture, no soft tissue abnormality, no significant degeneration, unchanged postoperative appearance from prior C3-4 ACDF with solid bone fusion, hardware intact (02/15/2024) Chest CT: Patchy opacities in right upper lung, likely infectious (02/15/2024) Brain MRI: Few scattered foci in paraventricular and subcortical white matter, slightly increased since 2021, likely mild chronic microvascular ischemic changes, chronic microhemorrhage in left cerebellum and anterior left frontal lobe, new from prior (02/15/2024)  DIAGNOSTIC EKG: Bradycardia, heart rate 55, normal QRS, QT 466, not consistent with acute STEMI (02/15/2024)  Assessment & Plan:   Assessment and Plan Assessment & Plan Right upper lobe pneumonia Atypical pneumonia in the right upper lung lobe with patchy opacities, likely infectious. Symptoms include chills and persistent fatigue. Completed azithromycin  course and received Rocephin  injection. - Repeat chest x-ray in 4-6 weeks to assess resolution.  Recurrent maxillary sinusitis Sinus pressure, headaches, and ear congestion suggest residual sinus infection. Previous azithromycin  and cefdinir  completed. Considering doxycycline  despite past concerns about side effects, as it is the next appropriate antibiotic. Emotional instability previously linked to duloxetine, not doxycycline . - Prescribe doxycycline  for sinus infection. - Monitor for adverse effects of doxycycline , such as emotional instability. - If doxycycline  is not tolerated, consider retrying azithromycin .  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 Chronic microvascular ischemic changes and microhemorrhages in left cerebellum and anterior left frontal lobe,  likely related to hypertension and possibly exacerbated by Eliquis . On irbesartan  and losartan, leading to duplication of therapy. - Increase irbesartan  to 150 mg daily and discontinue losartan to avoid duplication. - Monitor blood pressure regularly. - Discuss findings with neurologist during upcoming appointment.  Type 2 diabetes mellitus with hyperglycemia Diabetes management ongoing with pharmacist assistance. A1c due for re-evaluation. - Recheck A1c levels.  Anemia Heavily anemic, contributing to fatigue. Recent labs showed low albumin  but otherwise normal metabolic panel. - Repeat CBC  to assess current anemia status.  Gait instability and recurrent falls Multiple falls likely related to weakness and anemia. Neurology appointment scheduled for further evaluation. Reports feeling too weak for physical therapy but acknowledges potential benefits. - Refer to home health physical therapy to address weakness and prevent further falls. - Advise use of walker for ambulation to prevent falls.  Recording duration: 28 minutes      Return in about 2 weeks (around 03/11/2024).     Ginger Patrick, MSN, APRN, FNP-C [facility]

## 2024-02-27 NOTE — Progress Notes (Signed)
 Diabetes Self-Management Education  Visit Type: First/Initial  Appt. Start Time: 1515 Appt. End Time: 1645  03/03/2024  Katie Buckley, identified by name and date of birth, is a 73 y.o. female with a diagnosis of Diabetes: Type 2.   ASSESSMENT  Patient is here today alone with CGM sensor libre 3. Patient would like to learn more about diabetes to improve her blood sugar control. Pt states she is tired and shares her CGM value is 63 mg/dL during this encounter. RD completed a finger prick using FTO meter with a value of 80 mg/dL. Pt was provided a pack of nabs and 4 oz regular soda with a return CGM value to 75 mg/dL in 15 minutes. Pt blood sugar reading was 108 mg/dL leaving the office today.   Pt reports her fasting blood sugar this morning was 126 mg/dL. Pt reports she administered two tablets of glipizide  10 minutes before a pack of nabs and sprite zero. Pt c/o low blood sugar and states drinking half a cup of apple juice stating last night 65 mg/dL. Pt reports administering Tresiba  14 units after dinner near ~ 8:30 pm daily.   Pt reports she has made the following changes including avoiding sugary sweetened beverages. Pt reports intake of regular soda once to twice weekly. Patient lives with her husband and does the shopping and cooking. Pt reports she thought carbs were bad and thought she needed to avoid them. Pt c/o bloating after meals and states having smaller meals most recently. All Pt's questions were answered during this encounter.    CGM Results from download:   % Time CGM active:   95 %   (Goal >70%)  Average glucose:   151 mg/dL for 14 days  Glucose management indicator:   6.6 %  Time in range (70-180 mg/dL):   23%   (Goal >29%)  Time High (181-250 mg/dL):   18 %   (Goal < 74%)  Time Very High (>250 mg/dL):    4 %   (Goal < 5%)  Time Low (54-69 mg/dL):   2 %   (Goal <5%)  Time Very Low (<54 mg/dL):   0 %   (Goal <8%)   History includes:   Past Medical  History:  Diagnosis Date   Anemia    Anxiety    Aortic stenosis 01/21/2013   s/p bioprosthetic TAVR 10/2021   Arthritis    Left Hip   Coronary artery disease    CABG 2023 x1   Diabetes mellitus without complication (HCC)    Diabetic neuropathy (HCC)    Diabetic retinopathy (HCC)    Frequent sinus infections    GERD (gastroesophageal reflux disease)    Headache    Tension headaches   Heart murmur    AVR 2023   History of Barrett's esophagus    History of colon polyps    Hyperchloremia    Hypertension    MRSA (methicillin resistant staph aureus) culture positive 2018   history of, in hand   Palpitations    Pneumonia    Hx   Seasonal allergies    Skin cancer    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    Medications include:   Current Outpatient Medications:    acetaminophen  (TYLENOL ) 650 MG CR tablet, Take 1,300 mg by mouth every 8 (eight) hours as needed for pain (pain)., Disp: , Rfl:    amLODipine  (NORVASC ) 5 MG tablet, Take 1 tablet (5 mg total) by mouth daily., Disp: 30  tablet, Rfl: 1   apixaban  (ELIQUIS ) 5 MG TABS tablet, Take 1 tablet (5 mg total) by mouth 2 (two) times daily., Disp: 60 tablet, Rfl: 2   Blood Glucose Monitoring Suppl (CONTOUR NEXT MONITOR) w/Device KIT, Use to check blood sugar daily, Disp: 1 kit, Rfl: 0   buPROPion  (WELLBUTRIN  SR) 150 MG 12 hr tablet, Take 150 mg by mouth 2 (two) times daily., Disp: , Rfl:    butalbital -acetaminophen -caffeine  (FIORICET) 50-325-40 MG tablet, TAKE 1 TABLET BY MOUTH EVERY 6 HOURS AS NEEDED FOR HEADACHE., Disp: 30 tablet, Rfl: 4   Continuous Glucose Sensor (FREESTYLE LIBRE 3 PLUS SENSOR) MISC, Use to check blood sugar continuously. Change sensor every 15 days., Disp: 6 each, Rfl: 1   doxycycline  (VIBRA -TABS) 100 MG tablet, Take 1 tablet (100 mg total) by mouth 2 (two) times daily for 10 days., Disp: 20 tablet, Rfl: 0   ezetimibe  (ZETIA ) 10 MG tablet, Take 1 tablet (10 mg total) by mouth at bedtime., Disp: 90 tablet, Rfl: 3    FLUoxetine (PROZAC) 40 MG capsule, Take 40 mg by mouth daily., Disp: , Rfl:    fluticasone  (FLONASE ) 50 MCG/ACT nasal spray, Place 1 spray into both nostrils daily as needed for allergies or rhinitis., Disp: , Rfl:    glipiZIDE  (GLUCOTROL  XL) 2.5 MG 24 hr tablet, Take 1 tablet PO each morning x1 week then may increase to 2 tabs qam as instructed ., Disp: 180 tablet, Rfl: 0   glucose blood (CONTOUR NEXT TEST) test strip, Use to check blood sugar once daily, Disp: 100 each, Rfl: 3   insulin  degludec (TRESIBA  FLEXTOUCH) 100 UNIT/ML FlexTouch Pen, INJECT 10 UNITS SUBCUTANEOUSLY AS DIRECTED & INCREASE ONLY AS INSTRUCTED TO MAX DOSE OF 40 UNITS, Disp: 15 mL, Rfl: 1   irbesartan  (AVAPRO ) 150 MG tablet, Take 1 tablet (150 mg total) by mouth daily., Disp: 90 tablet, Rfl: 0   Lancet Devices MISC, , Disp: , Rfl:    metFORMIN  (GLUCOPHAGE -XR) 500 MG 24 hr tablet, TAKE 2 TABLETS BY MOUTH EVERY DAY, Disp: 180 tablet, Rfl: 3   metoprolol  tartrate (LOPRESSOR ) 50 MG tablet, Take 1 tablet (50 mg total) by mouth 2 (two) times daily., Disp: 60 tablet, Rfl: 2   ondansetron  (ZOFRAN -ODT) 4 MG disintegrating tablet, Take 1 tablet (4 mg total) by mouth every 8 (eight) hours as needed for nausea or vomiting., Disp: 20 tablet, Rfl: 0   pregabalin  (LYRICA ) 75 MG capsule, TAKE 1 CAPSULE BY MOUTH TWICE A DAY, Disp: 60 capsule, Rfl: 2   RABEprazole  (ACIPHEX ) 20 MG tablet, Take 1 tablet (20 mg total) by mouth 2 times daily at 12 noon and 4 pm., Disp: 180 tablet, Rfl: 3   rosuvastatin  (CRESTOR ) 20 MG tablet, TAKE 1 TABLET BY MOUTH EVERY DAY IN THE EVENING, Disp: 90 tablet, Rfl: 1   traZODone  (DESYREL ) 50 MG tablet, Take 50-100 mg by mouth at bedtime., Disp: , Rfl:    Vibegron  (GEMTESA ) 75 MG TABS, Take 1 tablet (75 mg total) by mouth daily., Disp: 90 tablet, Rfl: 3  Labs noted:   Lab Results  Component Value Date   HGBA1C 6.5 02/26/2024   Lab Results  Component Value Date   CHOL 182 07/24/2023   HDL 95.20 07/24/2023    LDLCALC 53 07/24/2023   TRIG 171.0 (H) 07/24/2023   CHOLHDL 2 07/24/2023   Wt Readings from Last 3 Encounters:  02/26/24 144 lb 9.6 oz (65.6 kg)  02/25/24 145 lb 3.2 oz (65.9 kg)  02/15/24 146 lb (66.2  kg)   There were no vitals taken for this visit. There is no height or weight on file to calculate BMI.   Diabetes Self-Management Education - 03/03/24 1511       Visit Information   Visit Type First/Initial      Initial Visit   Diabetes Type Type 2    Are you currently following a meal plan? No    Are you taking your medications as prescribed? Yes      Health Coping   How would you rate your overall health? Fair      Psychosocial Assessment   Patient Belief/Attitude about Diabetes Motivated to manage diabetes    What is the hardest part about your diabetes right now, causing you the most concern, or is the most worrisome to you about your diabetes?   Making healty food and beverage choices;Being active    Self-care barriers None    Self-management support Doctor's office    Other persons present Patient    Patient Concerns Nutrition/Meal planning;Healthy Lifestyle    Special Needs None    Preferred Learning Style No preference indicated    Learning Readiness Contemplating    How often do you need to have someone help you when you read instructions, pamphlets, or other written materials from your doctor or pharmacy? 1 - Never    What is the last grade level you completed in school? bachelors      Pre-Education Assessment   Patient understands the diabetes disease and treatment process. Needs Review    Patient understands incorporating nutritional management into lifestyle. Needs Review    Patient undertands incorporating physical activity into lifestyle. Needs Review    Patient understands using medications safely. Needs Review    Patient understands monitoring blood glucose, interpreting and using results Needs Review    Patient understands prevention, detection, and  treatment of acute complications. Needs Review    Patient understands prevention, detection, and treatment of chronic complications. Needs Review    Patient understands how to develop strategies to address psychosocial issues. Needs Review    Patient understands how to develop strategies to promote health/change behavior. Needs Review      Complications   Last HgB A1C per patient/outside source 8 %    How often do you check your blood sugar? > 4 times/day    Fasting Blood glucose range (mg/dL) <29;29-870    Number of hypoglycemic episodes per month 12    Can you tell when your blood sugar is low? Yes    What do you do if your blood sugar is low? tired    Number of hyperglycemic episodes ( >200mg /dL): Occasional    Can you tell when your blood sugar is high? Yes    Have you had a dilated eye exam in the past 12 months? Yes    Have you had a dental exam in the past 12 months? Yes    Are you checking your feet? Yes    How many days per week are you checking your feet? 3      Dietary Intake   Breakfast nabs, sprite zero or protein shake with 8 grams of CHO    Lunch 1 slices of bread, mustard, mayo, ham, sprite zero    Dinner [employer], chips with cheese sauce from [employer], frozen sugary beverage from [employer]    Beverage(s) water , soda twice weekly, sprite zero      Activity / Exercise   Activity / Exercise Type Moderate (swimming /  aerobic walking)    How many days per week do you exercise? 2    How many minutes per day do you exercise? 45    Total minutes per week of exercise 90      Patient Education   Previous Diabetes Education Yes    Disease Pathophysiology Explored patient's options for treatment of their diabetes    Healthy Eating Food label reading, portion sizes and measuring food.;Plate Method;Reviewed blood glucose goals for pre and post meals and how to evaluate the patients' food intake on their blood glucose level.;Role of diet in the treatment of diabetes and the  relationship between the three main macronutrients and blood glucose level    Being Active Role of exercise on diabetes management, blood pressure control and cardiac health.    Medications Reviewed patients medication for diabetes, action, purpose, timing of dose and side effects.    Monitoring Identified appropriate SMBG and/or A1C goals.;Daily foot exams;Yearly dilated eye exam;Taught/evaluated CGM (comment)    Acute complications Taught prevention, symptoms, and  treatment of hypoglycemia - the 15 rule.    Chronic complications Relationship between chronic complications and blood glucose control    Diabetes Stress and Support Worked with patient to identify barriers to care and solutions    Preconception care --   n/a   Lifestyle and Health Coping Lifestyle issues that need to be addressed for better diabetes care      Individualized Goals (developed by patient)   Nutrition Follow meal plan discussed    Physical Activity Exercise 1-2 times per week;45 minutes per day    Medications take my medication as prescribed    Monitoring  Consistenly use CGM    Problem Solving Addressing barriers to behavior change;Eating Pattern    Reducing Risk do foot checks daily;treat hypoglycemia with 15 grams of carbs if blood glucose less than 70mg /dL    Health Coping Ask for help with psychological, social, or emotional issues      Post-Education Assessment   Patient understands the diabetes disease and treatment process. Comprehends key points    Patient understands incorporating nutritional management into lifestyle. Needs Review    Patient undertands incorporating physical activity into lifestyle. Comprehends key points    Patient understands using medications safely. Comphrehends key points    Patient understands monitoring blood glucose, interpreting and using results Comprehends key points    Patient understands prevention, detection, and treatment of acute complications. Comprehends key points     Patient understands prevention, detection, and treatment of chronic complications. Comprehends key points    Patient understands how to develop strategies to address psychosocial issues. Comprehends key points    Patient understands how to develop strategies to promote health/change behavior. Comprehends key points      Outcomes   Expected Outcomes Demonstrated interest in learning. Expect positive outcomes    Future DMSE 3-4 months    Program Status Not Completed          Individualized Plan for Diabetes Self-Management Training:   Learning Objective:  Patient will have a greater understanding of diabetes self-management. Patient education plan is to attend individual and/or group sessions per assessed needs and concerns.   Plan:   Patient Instructions  Purchase and carry a fast acting sugar source using the rule of 15 with blood sugar less than 70 mg/dL  Balanced meals and snacks using the plate planner or the carbohydrate controlled snack list  Expected Outcomes:  Demonstrated interest in learning. Expect positive outcomes  Education material  provided: ADA - How to Thrive: A Guide for Your Journey with Diabetes, My Plate, Snack sheet, and Diabetes Resources  If problems or questions, patient to contact team via:  Phone  Future DSME appointment: 3-4 months

## 2024-02-28 DIAGNOSIS — C44619 Basal cell carcinoma of skin of left upper limb, including shoulder: Secondary | ICD-10-CM | POA: Diagnosis not present

## 2024-02-29 DIAGNOSIS — I10 Essential (primary) hypertension: Secondary | ICD-10-CM | POA: Diagnosis not present

## 2024-02-29 DIAGNOSIS — G939 Disorder of brain, unspecified: Secondary | ICD-10-CM | POA: Diagnosis not present

## 2024-02-29 DIAGNOSIS — R2689 Other abnormalities of gait and mobility: Secondary | ICD-10-CM | POA: Diagnosis not present

## 2024-02-29 DIAGNOSIS — R413 Other amnesia: Secondary | ICD-10-CM | POA: Diagnosis not present

## 2024-03-03 ENCOUNTER — Telehealth: Payer: Self-pay | Admitting: Family

## 2024-03-03 ENCOUNTER — Encounter: Attending: Family | Admitting: Dietician

## 2024-03-03 DIAGNOSIS — E1165 Type 2 diabetes mellitus with hyperglycemia: Secondary | ICD-10-CM | POA: Diagnosis not present

## 2024-03-03 DIAGNOSIS — E782 Mixed hyperlipidemia: Secondary | ICD-10-CM | POA: Diagnosis not present

## 2024-03-03 DIAGNOSIS — R531 Weakness: Secondary | ICD-10-CM

## 2024-03-03 DIAGNOSIS — Z713 Dietary counseling and surveillance: Secondary | ICD-10-CM | POA: Insufficient documentation

## 2024-03-03 DIAGNOSIS — I10 Essential (primary) hypertension: Secondary | ICD-10-CM | POA: Insufficient documentation

## 2024-03-03 DIAGNOSIS — R2681 Unsteadiness on feet: Secondary | ICD-10-CM

## 2024-03-03 DIAGNOSIS — J189 Pneumonia, unspecified organism: Secondary | ICD-10-CM

## 2024-03-03 NOTE — Addendum Note (Signed)
 Addended by: CORWIN ANTU on: 03/03/2024 12:40 PM   Modules accepted: Orders

## 2024-03-03 NOTE — Patient Instructions (Addendum)
 Purchase and carry a fast acting sugar source using the rule of 15 with blood sugar less than 70 mg/dL  Balanced meals and snacks using the plate planner or the carbohydrate controlled snack list

## 2024-03-03 NOTE — Telephone Encounter (Signed)
 Re routed referral for in person rehab

## 2024-03-03 NOTE — Telephone Encounter (Signed)
 Copied from CRM [id number]. Topic: General - Other >> Mar 03, 2024 10:35 AM Chiquita SQUIBB wrote: Reason for CRM: Soni from [facility] is calling in to let the provider know they will not be admitting her as she is not home bound. A good call back number for any questions is [phone].

## 2024-03-05 ENCOUNTER — Encounter: Payer: Self-pay | Admitting: Family

## 2024-03-05 ENCOUNTER — Telehealth: Payer: Self-pay | Admitting: Pharmacist

## 2024-03-05 ENCOUNTER — Encounter: Payer: Self-pay | Admitting: Pharmacist

## 2024-03-05 DIAGNOSIS — H04123 Dry eye syndrome of bilateral lacrimal glands: Secondary | ICD-10-CM | POA: Diagnosis not present

## 2024-03-05 NOTE — Progress Notes (Unsigned)
 Brief Telephone Documentation Reason for Call: Patient left message regarding increased frequency of low sugars this week on CGM   Summary of Call: Reported Regimen today: Glipizide  XL 5 mg each morning (taking two 2.5 mg pills)  Tresiba  14 units once daily (night)  Metformin  XR 1000 mg each morning  Patient notes she has had a low appetite over the past week as she has been on an antibiotic course for a sinus infection. With this, she has been experiencing an increase in low alarms on her [place].  She notes her appetite seems to be coming back, today had lunch. Already took glipizide  XL this morning. Uses insulin  at night.   SMBG (Libre 3+ - connected to LibreView)    Follow Up: Increase in hypoglycemia frequency 2/2 low food intake on sulfonylurea. Prior to this week, lows were infrequent though did occur occasionally after dinner meal also likely from sulfonylurea with inadequate meal/food intake. No lows so far today given she has been eating more. Will plan for 1-time reduced Tresiba  dose tonight since she already took glipizide  this morning,.  Tresiba  10 units tonight if sugars today remain on lower end or if any ongoing hypoglycemia today.  If sugars normalized today, reduce to 12 units for tonight.  May need to reduce glipizide  form 5 to 2.5 mg daily again moving forward given ongoing PP drops on occasion.   [name], PharmD Clinical Pharmacist [facility] [phone]

## 2024-03-05 NOTE — Telephone Encounter (Signed)
 Sounds like a plan.  So you are calling her with these recommendations below?

## 2024-03-05 NOTE — Telephone Encounter (Signed)
Makes sense. Thank you! 

## 2024-03-05 NOTE — Telephone Encounter (Signed)
 Manuelita I know we have been tag teaming her diabetic management, are you able to pull her herlene without her being in the office (I am sorry I haven't yet worked with this yet. See note below she is having lows.   I imagine we'll need to go down on the insulin 

## 2024-03-06 ENCOUNTER — Other Ambulatory Visit: Payer: Self-pay | Admitting: Orthopedic Surgery

## 2024-03-06 DIAGNOSIS — S42292D Other displaced fracture of upper end of left humerus, subsequent encounter for fracture with routine healing: Secondary | ICD-10-CM

## 2024-03-07 ENCOUNTER — Ambulatory Visit
Admission: RE | Admit: 2024-03-07 | Discharge: 2024-03-07 | Disposition: A | Discharge status: Home / Self Care | Source: Ambulatory Visit | Attending: Orthopedic Surgery | Admitting: Orthopedic Surgery

## 2024-03-07 DIAGNOSIS — J3489 Other specified disorders of nose and nasal sinuses: Secondary | ICD-10-CM | POA: Diagnosis not present

## 2024-03-07 DIAGNOSIS — J324 Chronic pansinusitis: Secondary | ICD-10-CM | POA: Diagnosis not present

## 2024-03-07 DIAGNOSIS — S42292D Other displaced fracture of upper end of left humerus, subsequent encounter for fracture with routine healing: Secondary | ICD-10-CM | POA: Insufficient documentation

## 2024-03-07 DIAGNOSIS — I700 Atherosclerosis of aorta: Secondary | ICD-10-CM | POA: Diagnosis not present

## 2024-03-07 DIAGNOSIS — J320 Chronic maxillary sinusitis: Secondary | ICD-10-CM | POA: Diagnosis not present

## 2024-03-07 DIAGNOSIS — M19012 Primary osteoarthritis, left shoulder: Secondary | ICD-10-CM | POA: Diagnosis not present

## 2024-03-07 DIAGNOSIS — J348212 External nasal valve collapse, dynamic: Secondary | ICD-10-CM | POA: Diagnosis not present

## 2024-03-12 DIAGNOSIS — S42292D Other displaced fracture of upper end of left humerus, subsequent encounter for fracture with routine healing: Secondary | ICD-10-CM | POA: Diagnosis not present

## 2024-03-13 ENCOUNTER — Encounter: Payer: Self-pay | Admitting: Family

## 2024-03-13 DIAGNOSIS — R2681 Unsteadiness on feet: Secondary | ICD-10-CM | POA: Diagnosis not present

## 2024-03-13 DIAGNOSIS — M6281 Muscle weakness (generalized): Secondary | ICD-10-CM | POA: Diagnosis not present

## 2024-03-14 ENCOUNTER — Other Ambulatory Visit: Payer: Self-pay | Admitting: Family

## 2024-03-14 DIAGNOSIS — Z1231 Encounter for screening mammogram for malignant neoplasm of breast: Secondary | ICD-10-CM

## 2024-03-17 ENCOUNTER — Encounter: Payer: Self-pay | Admitting: Internal Medicine

## 2024-03-17 NOTE — Telephone Encounter (Signed)
 Can you give her a call and see if we can fit her on at 1220?

## 2024-03-18 ENCOUNTER — Ambulatory Visit: Payer: Self-pay | Admitting: Family

## 2024-03-18 ENCOUNTER — Telehealth: Payer: Self-pay

## 2024-03-18 ENCOUNTER — Ambulatory Visit
Admission: RE | Admit: 2024-03-18 | Discharge: 2024-03-18 | Disposition: A | Discharge status: Home / Self Care | Source: Ambulatory Visit | Attending: Family | Admitting: Family

## 2024-03-18 ENCOUNTER — Other Ambulatory Visit: Payer: Self-pay

## 2024-03-18 ENCOUNTER — Encounter
Admission: RE | Admit: 2024-03-18 | Discharge: 2024-03-18 | Disposition: A | Discharge status: Home / Self Care | Source: Ambulatory Visit | Attending: Orthopedic Surgery | Admitting: Orthopedic Surgery

## 2024-03-18 ENCOUNTER — Encounter: Payer: Self-pay | Admitting: Family

## 2024-03-18 ENCOUNTER — Ambulatory Visit (INDEPENDENT_AMBULATORY_CARE_PROVIDER_SITE_OTHER): Admitting: Family

## 2024-03-18 VITALS — BP 134/70 | HR 66 | Temp 98.7°F | Ht 60.0 in | Wt 146.0 lb

## 2024-03-18 VITALS — BP 169/57 | HR 58 | Temp 98.0°F | Resp 16 | Ht 60.0 in | Wt 145.8 lb

## 2024-03-18 DIAGNOSIS — Z01818 Encounter for other preprocedural examination: Secondary | ICD-10-CM

## 2024-03-18 DIAGNOSIS — R635 Abnormal weight gain: Secondary | ICD-10-CM | POA: Diagnosis not present

## 2024-03-18 DIAGNOSIS — R350 Frequency of micturition: Secondary | ICD-10-CM | POA: Diagnosis not present

## 2024-03-18 DIAGNOSIS — R0609 Other forms of dyspnea: Secondary | ICD-10-CM | POA: Insufficient documentation

## 2024-03-18 DIAGNOSIS — R197 Diarrhea, unspecified: Secondary | ICD-10-CM

## 2024-03-18 DIAGNOSIS — E119 Type 2 diabetes mellitus without complications: Secondary | ICD-10-CM | POA: Diagnosis not present

## 2024-03-18 DIAGNOSIS — R1084 Generalized abdominal pain: Secondary | ICD-10-CM | POA: Diagnosis not present

## 2024-03-18 DIAGNOSIS — R1013 Epigastric pain: Secondary | ICD-10-CM | POA: Insufficient documentation

## 2024-03-18 DIAGNOSIS — R10A3 Flank pain, bilateral: Secondary | ICD-10-CM | POA: Diagnosis not present

## 2024-03-18 DIAGNOSIS — R140 Abdominal distension (gaseous): Secondary | ICD-10-CM | POA: Insufficient documentation

## 2024-03-18 DIAGNOSIS — I7090 Unspecified atherosclerosis: Secondary | ICD-10-CM | POA: Insufficient documentation

## 2024-03-18 DIAGNOSIS — R109 Unspecified abdominal pain: Secondary | ICD-10-CM | POA: Diagnosis not present

## 2024-03-18 DIAGNOSIS — E13319 Other specified diabetes mellitus with unspecified diabetic retinopathy without macular edema: Secondary | ICD-10-CM

## 2024-03-18 DIAGNOSIS — T7819XA Other adverse food reactions, not elsewhere classified, initial encounter: Secondary | ICD-10-CM | POA: Diagnosis not present

## 2024-03-18 DIAGNOSIS — R1011 Right upper quadrant pain: Secondary | ICD-10-CM | POA: Diagnosis not present

## 2024-03-18 DIAGNOSIS — Z794 Long term (current) use of insulin: Secondary | ICD-10-CM | POA: Diagnosis not present

## 2024-03-18 HISTORY — DX: Pain in left shoulder: M25.512

## 2024-03-18 LAB — AMYLASE: Amylase: 70 U/L (ref 27–131)

## 2024-03-18 LAB — CBC WITH DIFFERENTIAL/PLATELET
Basophils Absolute: 0 K/uL (ref 0.0–0.1)
Basophils Relative: 0.5 % (ref 0.0–3.0)
Eosinophils Absolute: 0.1 K/uL (ref 0.0–0.7)
Eosinophils Relative: 2.2 % (ref 0.0–5.0)
HCT: 35.1 % — ABNORMAL LOW (ref 36.0–46.0)
Hemoglobin: 11.5 g/dL — ABNORMAL LOW (ref 12.0–15.0)
Lymphocytes Relative: 18.3 % (ref 12.0–46.0)
Lymphs Abs: 1.2 K/uL (ref 0.7–4.0)
MCHC: 32.8 g/dL (ref 30.0–36.0)
MCV: 83.8 fl (ref 78.0–100.0)
Monocytes Absolute: 0.6 K/uL (ref 0.1–1.0)
Monocytes Relative: 9.1 % (ref 3.0–12.0)
Neutro Abs: 4.7 K/uL (ref 1.4–7.7)
Neutrophils Relative %: 69.9 % (ref 43.0–77.0)
Platelets: 127 K/uL — ABNORMAL LOW (ref 150.0–400.0)
RBC: 4.19 Mil/uL (ref 3.87–5.11)
RDW: 14.8 % (ref 11.5–15.5)
WBC: 6.8 K/uL (ref 4.0–10.5)

## 2024-03-18 LAB — COMPREHENSIVE METABOLIC PANEL WITH GFR
ALT: 15 U/L (ref 0–35)
AST: 20 U/L (ref 0–37)
Albumin: 4.2 g/dL (ref 3.5–5.2)
Alkaline Phosphatase: 60 U/L (ref 39–117)
BUN: 11 mg/dL (ref 6–23)
CO2: 32 meq/L (ref 19–32)
Calcium: 9.8 mg/dL (ref 8.4–10.5)
Chloride: 101 meq/L (ref 96–112)
Creatinine, Ser: 0.75 mg/dL (ref 0.40–1.20)
GFR: 79.44 mL/min (ref >60.00)
Glucose, Bld: 137 mg/dL — ABNORMAL HIGH (ref 70–99)
Potassium: 4.7 meq/L (ref 3.5–5.1)
Sodium: 139 meq/L (ref 135–145)
Total Bilirubin: 0.3 mg/dL (ref 0.2–1.2)
Total Protein: 6.2 g/dL (ref 6.0–8.3)

## 2024-03-18 LAB — POCT URINALYSIS DIP (CLINITEK)
Bilirubin, UA: NEGATIVE
Blood, UA: NEGATIVE
Glucose, UA: NEGATIVE mg/dL
Ketones, POC UA: NEGATIVE mg/dL
Leukocytes, UA: NEGATIVE
Nitrite, UA: NEGATIVE
POC PROTEIN,UA: NEGATIVE
Spec Grav, UA: 1.01 (ref 1.010–1.025)
Urobilinogen, UA: 0.2 U/dL
pH, UA: 7 (ref 5.0–8.0)

## 2024-03-18 LAB — SURGICAL PCR SCREEN
MRSA, PCR: NEGATIVE
Staphylococcus aureus: NEGATIVE

## 2024-03-18 LAB — TYPE AND SCREEN
ABO/RH(D): O POS
Antibody Screen: NEGATIVE

## 2024-03-18 LAB — LIPASE: Lipase: 47 U/L (ref 11.0–59.0)

## 2024-03-18 MED ORDER — PREGABALIN 75 MG PO CAPS: 75.0000 mg | ORAL_CAPSULE | Freq: Two times a day (BID) | ORAL | Status: AC

## 2024-03-18 MED ORDER — IOHEXOL 300 MG/ML  SOLN
100.0000 mL | Freq: Once | INTRAMUSCULAR | Status: AC | PRN
  Administered 2024-03-18: 100 mL via INTRAVENOUS

## 2024-03-18 NOTE — Telephone Encounter (Signed)
 After reviewing note.. I am unsure if CT abd angio OR CT angio chest is correct. I think Ginger may have been trying to order a CT abd and pelvis with contrast  and , mistakenly ordered Chest CT angio? I cannot make the change, but given this cannot be done today any way... PCP need to review order and place tomorrow AM when back in the office.

## 2024-03-18 NOTE — Progress Notes (Signed)
 Established Patient Office Visit  Subjective:      CC:  Chief Complaint  Patient presents with   Follow-up    Edema    HPI: Katie Buckley is a 74 y.o. female presenting on 03/18/2024 for Follow-up (Edema) .  Discussed the use of AI scribe software for clinical note transcription with the patient, who gave verbal consent to proceed.  History of Present Illness Katie Buckley is a 73 year old female who presents with significant abdominal bloating and discomfort.  She has been experiencing significant abdominal bloating and discomfort, described as feeling 'miserable,' particularly after eating. This causes her abdomen to feel 'weird' and her pants to become tight. The symptoms have progressively worsened over the past month. She notes a weight increase of one pound and suspects Sprite Zero, due to its carbonation and artificial sweeteners, as a potential cause, although she consumes it to avoid sugar. Bloating occurs with most foods and is associated with a hard line under her breast and pain. She denies eating much meat and has not identified specific dietary triggers.  She reports increased burping and flatulence but denies significant heartburn, although she takes medication daily for acid reflux. She has experienced intermittent diarrhea, with a notable episode the day before the visit, having diarrhea three times. No blood in stool, but she has noticed some bleeding, possibly from hemorrhoids. She has felt nauseous at times, particularly last night, but does not experience nausea daily. No vomiting.  She has not noticed increased swelling in her legs or significant shortness of breath, although she has experienced some since a recent pneumonia diagnosis. Her weight has increased by 11 pounds over the last six months, despite under-eating according to her pharmacist. She has experienced some palpitations recently and recalls being told she has a heart murmur. but she reports  lower back pain.  She is currently taking Tradjenta , Irbesartan , Hydroxyzine , and Lyrica , usually twice a day, and recently resumed taking Gemtesa  after realizing she had stopped, which had led to increased urinary frequency.      Wt Readings from Last 3 Encounters:  03/18/24 146 lb (66.2 kg)  02/26/24 144 lb 9.6 oz (65.6 kg)  02/25/24 145 lb 3.2 oz (65.9 kg)         Social history:  Relevant past medical, surgical, family and social history reviewed and updated as indicated. Interim medical history since our last visit reviewed.  Allergies and medications reviewed and updated.  DATA REVIEWED: CHART IN EPIC     ROS: Negative unless specifically indicated above in HPI.    Current Outpatient Medications:    acetaminophen  (TYLENOL ) 650 MG CR tablet, Take 1,300 mg by mouth every 8 (eight) hours as needed for pain (pain)., Disp: , Rfl:    amLODipine  (NORVASC ) 5 MG tablet, Take 1 tablet (5 mg total) by mouth daily. (Patient taking differently: Take 5 mg by mouth in the morning and at bedtime.), Disp: 30 tablet, Rfl: 1   apixaban  (ELIQUIS ) 5 MG TABS tablet, Take 1 tablet (5 mg total) by mouth 2 (two) times daily., Disp: 60 tablet, Rfl: 2   Blood Glucose Monitoring Suppl (CONTOUR NEXT MONITOR) w/Device KIT, Use to check blood sugar daily, Disp: 1 kit, Rfl: 0   buPROPion  (WELLBUTRIN  SR) 150 MG 12 hr tablet, Take 150 mg by mouth 2 (two) times daily., Disp: , Rfl:    butalbital -acetaminophen -caffeine  (FIORICET) 50-325-40 MG tablet, TAKE 1 TABLET BY MOUTH EVERY 6 HOURS AS NEEDED FOR HEADACHE., Disp: 30 tablet,  Rfl: 4   cholecalciferol (VITAMIN D3) 25 MCG (1000 UNIT) tablet, Take 1,000 Units by mouth daily., Disp: , Rfl:    Continuous Glucose Sensor (FREESTYLE LIBRE 3 PLUS SENSOR) MISC, Use to check blood sugar continuously. Change sensor every 15 days., Disp: 6 each, Rfl: 1   ezetimibe  (ZETIA ) 10 MG tablet, Take 1 tablet (10 mg total) by mouth at bedtime., Disp: 90 tablet, Rfl: 3    ferrous sulfate 325 (65 FE) MG tablet, Take 325 mg by mouth daily., Disp: , Rfl:    FLUoxetine (PROZAC) 40 MG capsule, Take 40 mg by mouth daily., Disp: , Rfl:    glipiZIDE  (GLUCOTROL  XL) 2.5 MG 24 hr tablet, Take 1 tablet PO each morning x1 week then may increase to 2 tabs qam as instructed ., Disp: 180 tablet, Rfl: 0   glucose blood (CONTOUR NEXT TEST) test strip, Use to check blood sugar once daily, Disp: 100 each, Rfl: 3   hydrochlorothiazide  (HYDRODIURIL ) 12.5 MG tablet, Take 12.5 mg by mouth daily., Disp: , Rfl:    hydrOXYzine  (VISTARIL ) 25 MG capsule, Take 25 mg by mouth daily as needed for anxiety., Disp: , Rfl:    insulin  degludec (TRESIBA  FLEXTOUCH) 100 UNIT/ML FlexTouch Pen, INJECT 10 UNITS SUBCUTANEOUSLY AS DIRECTED & INCREASE ONLY AS INSTRUCTED TO MAX DOSE OF 40 UNITS (Patient taking differently: Inject 14 Units into the skin at bedtime.), Disp: 15 mL, Rfl: 1   irbesartan  (AVAPRO ) 150 MG tablet, Take 1 tablet (150 mg total) by mouth daily., Disp: 90 tablet, Rfl: 0   Lancet Devices MISC, , Disp: , Rfl:    linagliptin  (TRADJENTA ) 5 MG TABS tablet, Take 5 mg by mouth daily., Disp: , Rfl:    Melatonin 10 MG TABS, Take 10 mg by mouth at bedtime., Disp: , Rfl:    metFORMIN  (GLUCOPHAGE -XR) 500 MG 24 hr tablet, TAKE 2 TABLETS BY MOUTH EVERY DAY, Disp: 180 tablet, Rfl: 3   metoprolol  tartrate (LOPRESSOR ) 50 MG tablet, Take 1 tablet (50 mg total) by mouth 2 (two) times daily., Disp: 60 tablet, Rfl: 2   RABEprazole  (ACIPHEX ) 20 MG tablet, Take 1 tablet (20 mg total) by mouth 2 times daily at 12 noon and 4 pm., Disp: 180 tablet, Rfl: 3   rosuvastatin  (CRESTOR ) 20 MG tablet, TAKE 1 TABLET BY MOUTH EVERY DAY IN THE EVENING (Patient taking differently: Take 20 mg by mouth at bedtime.), Disp: 90 tablet, Rfl: 1   traZODone  (DESYREL ) 50 MG tablet, Take 50-100 mg by mouth at bedtime., Disp: , Rfl:    Vibegron  (GEMTESA ) 75 MG TABS, Take 1 tablet (75 mg total) by mouth daily., Disp: 90 tablet, Rfl: 3    pregabalin  (LYRICA ) 75 MG capsule, Take 1 capsule (75 mg total) by mouth 2 (two) times daily., Disp: , Rfl:         Objective:        BP 134/70 (BP Location: Right Arm, Patient Position: Sitting, Cuff Size: Normal)   Pulse 66   Temp 98.7 F (37.1 C) (Temporal)   Ht 5' (1.524 m)   Wt 146 lb (66.2 kg)   SpO2 98%   BMI 28.51 kg/m   Physical Exam MEASUREMENTS: Weight- 146. CHEST: Lungs clear to auscultation. ABDOMEN: Abdomen tender to palpation, worst in right upper and left lower quadrants. Increased bowel sounds. Right kidney tenderness.  Wt Readings from Last 3 Encounters:  03/18/24 146 lb (66.2 kg)  02/26/24 144 lb 9.6 oz (65.6 kg)  02/25/24 145 lb 3.2 oz (65.9 kg)  Physical Exam Vitals reviewed.  Constitutional:      General: She is not in acute distress.    Appearance: Normal appearance. She is normal weight. She is not ill-appearing, toxic-appearing or diaphoretic.  HENT:     Head: Normocephalic.  Cardiovascular:     Rate and Rhythm: Normal rate and regular rhythm.     Heart sounds: Murmur heard.  Pulmonary:     Effort: Pulmonary effort is normal.  Abdominal:     General: Bowel sounds are increased. There is distension.     Palpations: Abdomen is soft.     Tenderness: There is generalized abdominal tenderness and tenderness in the right upper quadrant, right lower quadrant, epigastric area, suprapubic area, left upper quadrant and left lower quadrant. There is rebound. There is no guarding. Negative signs include Murphy's sign and McBurney's sign.     Comments: 10/10 ruq  8/10 left lower quadrant  Musculoskeletal:        General: Normal range of motion.     Right lower leg: No edema.     Left lower leg: No edema.  Neurological:     General: No focal deficit present.     Mental Status: She is alert and oriented to person, place, and time. Mental status is at baseline.  Psychiatric:        Mood and Affect: Mood normal.        Behavior: Behavior normal.         Thought Content: Thought content normal.        Judgment: Judgment normal.          Results   Assessment & Plan:   Assessment and Plan Assessment & Plan Abdominal bloating and pain with weight gain Reports significant abdominal bloating and pain, particularly postprandial, with an 11-pound weight gain over six months. Symptoms include nausea, diarrhea, and increased eructation. Pain is severe, rated up to 10/10 in certain areas. Denies heartburn and has no gallbladder. Differential diagnosis includes gastrointestinal issues such as H. pylori infection or food sensitivities. Recent antibiotic use raises the possibility of C. difficile infection. - Order CT scan of the abdomen. - Perform stool culture to rule out C. difficile infection. - Conduct lab work including urinalysis. - Test for H. pylori and alpha-gal. -abdominal pain in office, several ddx possible diverticulitis, pancreatitis (lower likelihood) constipation , liver   Type 2 diabetes mellitus, controlled Diabetes is well-controlled with an improved A1c. Under-eating according to the pharmacist, which may contribute to weight gain and bloating issues.  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  Urinary frequency, improved with medication Experienced increased urinary frequency due to not taking Gemtesa , but symptoms have improved since resuming the medication.        Return in about 2 weeks (around 04/01/2024) for f/u abdominal pain .     Ginger Patrick, MSN, APRN, FNP-C [facility]

## 2024-03-18 NOTE — Addendum Note (Signed)
 Addended by: HOPE VEVA PARAS on: 03/18/2024 11:01 AM   Modules accepted: Orders

## 2024-03-18 NOTE — Addendum Note (Signed)
 Addended by: ALBINO SHAVER C on: 03/18/2024 10:48 AM   Modules accepted: Orders

## 2024-03-18 NOTE — Patient Instructions (Addendum)
 Your procedure is scheduled on: Report to the Registration Desk on the 1st floor of the [facility]. To find out your arrival time, please call [phone] between 1PM - 3PM on: If your arrival time is 6:00 am, do not arrive before that time as the [facility] entrance doors do not open until 6:00 am.  REMEMBER: Instructions that are not followed completely may result in serious medical risk, up to and including death; or upon the discretion of your surgeon and anesthesiologist your surgery may need to be rescheduled.  Do not eat food after midnight the night before surgery.  No gum chewing or hard candies.  You may however, drink WATER  up to 2 hours before you are scheduled to arrive for your surgery. Do not drink anything within 2 hours of your scheduled arrival time.  In addition, your doctor has ordered for you to drink the provided: Gatorade G2 Drinking this carbohydrate drink up to two hours before surgery helps to reduce insulin  resistance and improve patient outcomes. Please complete drinking 2 hours before scheduled arrival time.  One week prior to surgery: Stop Anti-inflammatories (NSAIDS) such as Advil, Aleve, Ibuprofen, Motrin, Naproxen, Naprosyn and Aspirin  based products such as Excedrin, Goody's Powder, BC Powder. Stop ANY OVER THE COUNTER supplements until after surgery.  STOP apixaban  (ELIQUIS )  3 days prior to surgery. Last dose 03/21/24  You may however, continue to take Tylenol  if needed for pain up until the day of surgery.  Continue taking all of your other prescription medications up until the day of surgery.  ON THE DAY OF SURGERY ONLY TAKE THESE MEDICATIONS WITH SIPS OF WATER :  amLODipine  (NORVASC )  buPROPion  (WELLBUTRIN  SR)  FLUoxetine (PROZAC)  hydrOXYzine  (VISTARIL )  metoprolol  tartrate (LOPRESSOR )  pregabalin  (LYRICA )  rosuvastatin  (CRESTOR )   Use inhalers on the day of surgery and bring to the hospital.  No Alcohol for 24 hours before or after  surgery.  No Smoking including e-cigarettes for 24 hours before surgery.  No chewable tobacco products for at least 6 hours before surgery.  No nicotine patches on the day of surgery.  Do not use any recreational drugs for at least a week (preferably 2 weeks) before your surgery.  Please be advised that the combination of cocaine and anesthesia may have negative outcomes, up to and including death. If you test positive for cocaine, your surgery will be cancelled.  On the morning of surgery brush your teeth with toothpaste and water , you may rinse your mouth with mouthwash if you wish. Do not swallow any toothpaste or mouthwash.  Use CHG Soap or wipes as directed on instruction sheet.  Do not wear jewelry, make-up, hairpins, clips or nail polish.  For welded (permanent) jewelry: bracelets, anklets, waist bands, etc.  Please have this removed prior to surgery.  If it is not removed, there is a chance that hospital personnel will need to cut it off on the day of surgery.  Do not wear lotions, powders, or perfumes.   Do not shave body hair from the neck down 48 hours before surgery.  Contact lenses, hearing aids and dentures may not be worn into surgery.  Do not bring valuables to the hospital. [facility] is not responsible for any missing/lost belongings or valuables.   Total Shoulder Arthroplasty:  use Benzoyl Peroxide 5% Gel as directed on instruction sheet.  Bring your C-PAP to the hospital in case you may have to spend the night.   Notify your doctor if there is any  change in your medical condition (cold, fever, infection).  Wear comfortable clothing (specific to your surgery type) to the hospital.  After surgery, you can help prevent lung complications by doing breathing exercises.  Take deep breaths and cough every 1-2 hours. Your doctor may order a device called an Incentive Spirometer to help you take deep breaths. When coughing or sneezing, hold a pillow firmly against  your incision with both hands. This is called "splinting." Doing this helps protect your incision. It also decreases belly discomfort.  If you are being admitted to the hospital overnight, leave your suitcase in the car. After surgery it may be brought to your room.  In case of increased patient census, it may be necessary for you, the patient, to continue your postoperative care in the Same Day Surgery department.  If you are being discharged the day of surgery, you will not be allowed to drive home. You will need a responsible individual to drive you home and stay with you for 24 hours after surgery.   If you are taking public transportation, you will need to have a responsible individual with you.  Please call the Pre-admissions Testing Dept. at [phone] if you have any questions about these instructions.  Surgery Visitation Policy:  Patients having surgery or a procedure may have two visitors.  Children under the age of 56 must have an adult with them who is not the patient.  Inpatient Visitation:    Visiting hours are 7 a.m. to 8 p.m. Up to four visitors are allowed at one time in a patient room. The visitors may rotate out with other people during the day.  One visitor age 54 or older may stay with the patient overnight and must be in the room by 8 p.m.   Merchandiser, retail to address health-related social needs:  [email or web address]    Pre-operative 4 CHG Bath Instructions   You can play a key role in reducing the risk of infection after surgery. Your skin needs to be as free of germs as possible. You can reduce the number of germs on your skin by washing with CHG (chlorhexidine  gluconate) soap before surgery. CHG is an antiseptic soap that kills germs and continues to kill germs even after washing.   DO NOT use if you have an allergy to chlorhexidine /CHG or antibacterial soaps. If your skin becomes reddened or irritated, stop using the CHG and  notify one of our RNs at [phone].   Please shower with the CHG soap starting 4 days before surgery using the following schedule:     Please keep in mind the following:  DO NOT shave, including legs and underarms, starting the day of your first shower.   You may shave your face at any point before/day of surgery.  Place clean sheets on your bed the day you start using CHG soap. Use a clean washcloth (not used since being washed) for each shower. DO NOT sleep with pets once you start using the CHG.   CHG Shower Instructions:  If you choose to wash your hair and private area, wash first with your normal shampoo/soap.  After you use shampoo/soap, rinse your hair and body thoroughly to remove shampoo/soap residue.  Turn the water  OFF and apply about 3 tablespoons (45 ml) of CHG soap to a CLEAN washcloth.  Apply CHG soap ONLY FROM YOUR NECK DOWN TO YOUR TOES (washing for 3-5 minutes)  DO NOT use CHG soap on face, private areas, open wounds, or sores.  Pay special attention to the area where your surgery is being performed.  If you are having back surgery, having someone wash your back for you may be helpful. Wait 2 minutes after CHG soap is applied, then you may rinse off the CHG soap.  Pat dry with a clean towel  Put on clean clothes/pajamas   If you choose to wear lotion, please use ONLY the CHG-compatible lotions on the back of this paper.     Additional instructions for the day of surgery: DO NOT APPLY any lotions, deodorants, cologne, or perfumes.   Put on clean/comfortable clothes.  Brush your teeth.  Ask your nurse before applying any prescription medications to the skin.      CHG Compatible Lotions   Aveeno Moisturizing lotion  Cetaphil Moisturizing Cream  Cetaphil Moisturizing Lotion  Clairol Herbal Essence Moisturizing Lotion, Dry Skin  Clairol Herbal Essence Moisturizing Lotion, Extra Dry Skin  Clairol Herbal Essence Moisturizing Lotion, Normal Skin  Curel Age  Defying Therapeutic Moisturizing Lotion with Alpha Hydroxy  Curel Extreme Care Body Lotion  Curel Soothing Hands Moisturizing Hand Lotion  Curel Therapeutic Moisturizing Cream, Fragrance-Free  Curel Therapeutic Moisturizing Lotion, Fragrance-Free  Curel Therapeutic Moisturizing Lotion, Original Formula  Eucerin Daily Replenishing Lotion  Eucerin Dry Skin Therapy Plus Alpha Hydroxy Crme  Eucerin Dry Skin Therapy Plus Alpha Hydroxy Lotion  Eucerin Original Crme  Eucerin Original Lotion  Eucerin Plus Crme Eucerin Plus Lotion  Eucerin TriLipid Replenishing Lotion  Keri Anti-Bacterial Hand Lotion  Keri Deep Conditioning Original Lotion Dry Skin Formula Softly Scented  Keri Deep Conditioning Original Lotion, Fragrance Free Sensitive Skin Formula  Keri Lotion Fast Absorbing Fragrance Free Sensitive Skin Formula  Keri Lotion Fast Absorbing Softly Scented Dry Skin Formula  Keri Original Lotion  Keri Skin Renewal Lotion Keri Silky Smooth Lotion  Keri Silky Smooth Sensitive Skin Lotion  Nivea Body Creamy Conditioning Oil  Nivea Body Extra Enriched Lotion  Nivea Body Original Lotion  Nivea Body Sheer Moisturizing Lotion Nivea Crme  Nivea Skin Firming Lotion  NutraDerm 30 Skin Lotion  NutraDerm Skin Lotion  NutraDerm Therapeutic Skin Cream  NutraDerm Therapeutic Skin Lotion  ProShield Protective Hand Cream  Provon moisturizing lotion  Preparing for Total Shoulder Arthroplasty  Before surgery, you can play an important role by reducing the number of germs on your skin by using the following products:  Benzoyl Peroxide Gel  o Reduces the number of germs present on the skin  o Applied twice a day to shoulder area starting two days before surgery  Chlorhexidine  Gluconate (CHG) Soap  o An antiseptic cleaner that kills germs and bonds with the skin to continue killing germs even after washing  o Used for showering the night before surgery and morning of surgery    BENZOYL PEROXIDE  5% GEL                               Please do not use if you have an allergy to benzoyl peroxide. If your skin becomes reddened/irritated stop using the benzoyl peroxide.  Starting two days before surgery, apply as follows:  1. Apply benzoyl peroxide in the morning and at night. Apply after taking a shower. If you are not taking a shower, clean entire shoulder front, back, and side along with the armpit with a clean wet washcloth.  2. Place a quarter-sized dollop on your shoulder and rub in thoroughly, making sure to cover the front, back,  and side of your shoulder, along with the armpit.  2 days before ____ AM ____ PM 1 day before ____ AM ____ PM  3. Do this twice a day for two days. (Last application is the night before surgery, AFTER using the CHG soap).  4. Do NOT apply benzoyl peroxide gel on the day of surgery.

## 2024-03-18 NOTE — Progress Notes (Signed)
 Ref Provider:Dugal, Florestine, NP PCP: Corwin Florestine, NP Assessment and Plan:   In most patients we give written parts of assessment and plan to patient under Patient Instructions/After Visit Summary. So some parts are directed to patient.  Dear Ms. Annely  Tredway, It was our pleasure to participate in your care. We have typed up brief summary of what we discussed. 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 Elevated PTAL 217 without reduction in beta amyloid 42/40 ratio suggests neurodegenerative disease of beta-amyloid pathology, but no definitive diagnosis.  - Continue brain health strategies: physical activity, learning, social engagement. - Refer to speech therapy for cognitive training  Hearing loss Hearing loss possibly related to past severe sinus infection. 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 No falls post-pneumonia recovery.  - Continue physical therapy for balance improvement. - Advise monitoring and managing vascular risk factors: blood pressure, diet, exercise.  Peripheral neuropathy, including small fiber neuropathy Peripheral neuropathy contributing to  imbalance.  Follow up in 6 months with Dr. Jannett Fairly   Return in about 6 months (around 09/17/2024).  This note has been created using automated tools and reviewed for accuracy by ELIAS GREGORY RODRIGUEZ. Interim History date 03/19/2024   Ms. Heiser is a 73 y.o. female here for treatment and evaluation of Memory disorder, unaccompanied.    Ms. Krzyzanowski last visit was on 02/28/2024  Wateen  J Haskins is a 73 year old female with a memory disorder who presents for follow-up management.  She experiences memory issues, with some information being slow to recall and others not recalled initially but remembered later. She follows brain health recommendations, including a healthy diet, physical activity, and adequate sleep. She does not use over-the-counter supplements for memory improvement.  She has a history of neurodegenerative disease with positive biomarkers, including elevated PTAL 217 and no reduction in beta amyloid 42/40 ratio. An MRI of the brain on February 15, 2024, showed microvascular ischemic changes and chronic microhemorrhages in the left cerebellum and anterior left frontal lobe.  She experiences balance issues and has a history of diabetic peripheral neuropathy and small fiber neuropathy. She attends physical therapy for balance twice a week and finds the environment supportive.  She experienced lightheadedness and falls, leading to an emergency department visit on February 15, 2024, where she was also diagnosed with pneumonia.  She has a history of hearing loss following a severe sinus infection, impacting her balance and cognition.  She takes vitamin D3, although her last vitamin D level was slightly low five months ago. Recent blood work was done at her primary care provider's office, but results are pending.  She reports generally good sleep, although she had difficulty sleeping the previous night due to being 'hyped up.'  Interim History date 02/28/2024   Ms. Drum is a  73 y.o. female here for treatment and evaluation of Memory disorder,  Ms. Vogt last visit was on 10/18/2023  Patient states she has been having multiple falls.    Surya  MARISKA DAFFIN is a 73 year old female with a memory disorder who presents for follow-up regarding her memory issues and recent falls.  She experienced multiple falls during a recent trip to the beach, where she also developed pneumonia. She felt very confused and had a short memory. She recalls an incident at the pool where she fell and needed assistance to get up. She fell a total of seven times during the trip, often due to feeling off balance and weak. Since returning home and recovering from pneumonia, she has not experienced any further falls.  She has a history of memory problems with elevated PTAL 217 levels without a reduction in the amyloid beta 42/40 ratio. Memory labs in May showed these results, and she is concerned about the implications.  She has multifactorial imbalance issues, including small fiber neuropathy and peripheral neuropathy. An MRI of the brain on February 15, 2024, showed chronic microvascular ischemic changes and chronic microhemorrhages in the left cerebellum and anterior left frontal lobe. She has a history of cervical myelopathy.  Her blood pressure has been erratic over the past year despite taking prescribed medications. She regularly checks her blood pressure and has kept a record for her cardiologist, who has not indicated any issues. She is concerned about the potential impact of unmanaged blood pressure on her health.  She has chronic sinus problems and is currently experiencing a sinus issue. She reports a history of pneumonia during her recent trip, which she believes contributed to her falls due to weakness and imbalance.Head CT 02/15/2024 - 1. No acute intracranial abnormality. 2. No acute fracture or traumatic malalignment of the cervical spine. 3. Unchanged postoperative appearance from prior  C3-4 ACDF with solid bony fusion across the intervening disc space. Hardware is intact without associated lucency or fracture. 4. Patchy opacities in the apical right upper lung, possibly infectious or inflammatory in etiology.   Neck CT 02/15/2024 - 1. No acute intracranial abnormality. 2. No acute fracture or traumatic malalignment of the cervical spine. 3. Unchanged postoperative appearance from prior C3-4 ACDF with solid bony fusion across the intervening disc space. Hardware is intact without associated lucency or fracture. 4. Patchy opacities in the apical right upper lung, possibly infectious or inflammatory in etiology   MRI Brain 02/15/2024 - 1. No acute intracranial abnormality. 2. Mild chronic microvascular ischemic changes, slightly increased since 2021. 3. Chronic microhemorrhages in the left cerebellum and anterior left frontal lobe, new from prior.   Interim History date 10/18/2023   Ms. Royal is a 73 y.o. female here for treatment and evaluation of Memory disorder, unaccompanied  Ms. Barrantes last visit was on 07/05/2022  Patient is presenting for a 1+ year follow up for Multifactorial imbalance. Patient states symptoms have been worse, patient states she is still having problems with her balancer. Has a serious fall on March 4th where she lost her balance while standing on the bed and fall and broke her left arm  Patient would like to discuss some memory supplements and recommendations. States she still remembers very well just not as sharp as she once was History of Present Illness Averee  J Villagomez is a 73 year old female who presents with worsening balance issues and memory concerns.  Over the past year, she has experienced worsening balance issues, culminating in a fall on August 14, 2023, which resulted in a broken left arm. The  fall occurred when she lost her balance while standing on a bed. Since then, she has not had additional falls but remains concerned about her balance while  walking. She has difficulty getting up without using her hands due to fear of falling again.  She is interested in memory supplements, noting that while her memory is generally intact, she does not feel as sharp as before. She has been taking Prevagen and omega-3 fatty acids but is uncertain about their effectiveness. She recalls a significant improvement in her husband's memory after he took a memory supplement. She reports some difficulty with recall, as evidenced by her inability to remember one of three words during a memory test.  Her past medical history includes spinal stenosis, for which she underwent spinal fusion surgery on November 15, 2020. She also has a history of facial tics and was previously considered for drug-induced parkinsonism. 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 She had a CT scan of the head in December 2023 and an MRI of the brain in 2021.  She received vitamin B12 shots approximately six months ago after a low vitamin B12 level was detected in August 2024. She is not currently on any memory medication.  I reviewed labs, imaging, and notes in [place], [place], and from outside providers, if available. \  Results LABS Vitamin B12: 333 (01/2023)   LABS PTAU 217: elevated (10/2022) Beta amyloid 42/40 ratio: normal (10/2022)  RADIOLOGY Brain MRI: chronic microvascular ischemic changes, chronic microhemorrhages in left cerebellum and anterior left frontal lobe (02/15/2024)   LABS PTA: 217  RADIOLOGY MRI brain: Chronic microhemorrhages in left cerebellum and anterior left frontal lobe (02/15/2024)   Disease Summary: (Aggregate of information from previous visits)   Ms. Holts is a right handed 73 y.o. female banking here for evaluation of Memory disorder , referred by Dr. Corwin.  Multifactorial causes for Imbalance - in a patient with a history of diabetic peripheral neuropathy + small fiber neuropathy + concern for cervical  myelopathy - initially concerning for parkinsonism but negative Syn-one test. 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 She state feeling of imbalance began around February 2021. She states it happens most or all of the time. She states dizziness last 1 hour. Patient does  Have ear pressure and ear pain in her left ear. She states dizziness occurs while moving and standing stationary. Patient does have associated headache. Patient states dizziness is exacerbated by standing up and turning head. Patient has had ENT evaluation. Patient is going through stressful situation. Patient had orthostatic with primary care physician which was significant but this was not present during our visit.   Patient states she had noticed some stiffness in her body. Patient states she does not have a good sense of smell but this is a chronic issue as she has a history of chronic sinus issues. She does have to hold on to something when going from a sitting to standing position. She has occasional constipation. She does feel her volume of speech is not as loud as in the past. She does feel like she has to focus on writing bigger as she is starting to write smaller.   Dizziness with rapid head movement, ongoing for a long  time. No hemotympanum headache nausea or syncope or loss of conciousness.   We reviewed notes from [facility] (Dr. Milissa). She had negative VNG concerning for central pathology, negative for Benign Positional Paroxysmal Vertigo. Bilateral vestibular hypofunction. She is a former patient on Dr. Lane and was last seen in 2019. Patient has bilateral sensorineural hearing loss.   MRI brain with and without  contrast 03/17/2020: No acute intracranial abnormality. Partially imaged posterior disc osteophyte complex at C3-C4 with likely at least moderate canal stenosis at this level. MRI of the cervical spine could better characterize, if clinically indicated. Similar right frontal calvarial hyperostosis, as discussed above.   MRI Cervical Spine without contrast 04/08/2020: Mild to moderate C3-4 spinal canal and bilateral neural foraminal narrowing. Left C5-6 protrusion with severe left neural foraminal narrowing. Mild spinal canal narrowing at the C4-7 levels. Mild bilateral C4-5, right C5-6 and bilateral C6-7 neural foraminal narrowing.   MRA of neck w/wo contrast 9.13.22: The common carotid and internal carotid arteries are patent within the neck without appreciable hemodynamically significant stenosis (50% or greater). Apparent mild stenosis within the proximal left common carotid artery. The vertebral arteries are codominant and patent within the neck without appreciable stenosis.   CT Head/cervical spine without contrast 05/28/2022: No acute intracranial process. No acute fracture or traumatic subluxation of the cervical spine. Postsurgical changes at C3-C4.   Syn-One Test 03/22/2020: There is not pathological evidence of phosphorylated alpha-synuclein deposition within cutaneous nerves. This finding is not consistent with a diagnosis of an alpha-synucleinopathy. Intracellular misfolded phosphorylated alpha-synuclein protein results in accumulation of the protein (commonly known as Lewy bodies). The presence of p-syn in cutaneous nerve biopsy is >95% sensitive and >99% specific for the presence of alpha synucleinopathies. 2.   There is reduced intraepidermal nerve fiber density consistent with a length dependent small fiber  neuropathy.  3.   There is not pathological evidence of amyloid deposition in cutaneous nerves.    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 Patient has had these chronically per Husband. Evaluated by [facility] 08/08/2021 who was concerned for drug-induced parkinsonism.   History of carpal tunnel syndrome: status post bilateral carpal tunnel release surgery  Occipital Neuralgia with come component of myofascial pain syndrome Medications: Baclofen (stopped per patient)   History of carpal tunnel syndrome status post bilateral carpal tunnel release surgery  Bilateral sensorineural hearing loss   Saddle paresthesias, urinary retention  Physical Exam   Vitals Vitals:   03/19/24 1020  BP: 128/70  Pulse: 60  SpO2: 96%  Weight: 67.6 kg (149 lb)  Height: 152.4 cm (5')  PainSc: 0-No pain      Body mass index is 29.1 kg/m.  (Some of the exam changes noted are from previous clinical observations)  Physical Exam CARDIOVASCULAR: Heart murmur present.   CHEST: Lungs clear to auscultation. CARDIOVASCULAR: Heart sounds S1, S2 with systolic murmur, normal rate and rhythm.   CHEST: Lungs clear to auscultation. CARDIOVASCULAR: Heart sounds S1, S2 with  systolic murmur, normal rate, normal rhythm.   General Exam Other Historical Findings  Bilateral intraocular lenses  Slight swelling under right under eyelid  Pan systolic murmer   Neurological Exam Other Historical Findings  Difficulty with RAM in upper and lower  extremity on the right side. No motion arrests.  Decreased temperature sensation in bilateral feet  She has decreased sensation to light touch from her ankle down.  Good stride length, bilateral arm swing present  Gait is unsteady but not spastic, magnetic, or ataxic   Medications: Current Outpatient Medications on File Prior to Visit  Medication Sig Dispense Refill  . acetaminophen  (TYLENOL ) 325 MG tablet Take 2 tablets (650 mg total) by mouth every 8 (eight) hours as needed for Pain    . amLODIPine  (NORVASC ) 5 MG tablet Take 1 tablet (5 mg total) by mouth 2 (two) times daily 60 tablet 11  . apixaban  (ELIQUIS ) 5 mg tablet TAKE 1 TABLET BY MOUTH TWICE A DAY 180 tablet 3  . atorvastatin  (LIPITOR) 80 MG tablet Take 1 tablet (80 mg total) by mouth once daily    . buPROPion  (WELLBUTRIN  SR) 150 MG SR tablet Take 150 mg by mouth 2 (two) times daily    . butalbital -acetaminophen -caffeine  (FIORICET) 50-325-40 mg tablet Take 1 tablet by mouth every 6 (six) hours as needed    . canagliflozin -metformin  150-1,000 mg Tab Take by mouth 2 (two) times daily    . ezetimibe  (ZETIA ) 10 mg tablet Take 1 tablet by mouth at bedtime    . FLUoxetine (PROZAC) 40 MG capsule Take 40 mg by mouth once daily    . glipiZIDE  (GLUCOTROL ) 5 MG tablet Take 5 mg by mouth every morning before breakfast    . hydroCHLOROthiazide  (HYDRODIURIL ) 12.5 MG tablet Take 1 tablet (12.5 mg total) by mouth once daily 90 tablet 3  . irbesartan  (AVAPRO ) 75 MG tablet Take 75 mg by mouth once daily    . losartan (COZAAR) 50 MG tablet TAKE 1 TABLET BY MOUTH EVERY DAY 90 tablet 3  . metFORMIN  (GLUCOPHAGE -XR) 500 MG XR tablet Take 2 tablets (1,000 mg total) by mouth once daily    . metoprolol  TARTrate (LOPRESSOR ) 50 MG tablet TAKE 1 TABLET BY MOUTH TWICE A DAY 180 tablet 3  . ondansetron  (ZOFRAN -ODT) 4 MG disintegrating tablet Take 4 mg by mouth 2 (two) times daily  FOR 7 DAYS(PA IN PROCESS)    . ONETOUCH ULTRA TEST test strip 1 strip once daily     . pregabalin  (LYRICA ) 75 MG capsule Take 1 capsule (75 mg total) by mouth 3 (three) times daily for 180 days 90 capsule 5  . RABEprazole  (ACIPHEX ) 20 mg EC tablet Take 20 mg by mouth once daily    . rosuvastatin  (CRESTOR ) 20 MG tablet     . traMADoL  (ULTRAM ) 50 mg tablet Take 1 tablet (50 mg total) by mouth every 6 (six) hours as needed for Pain 14 tablet 0  . traZODone  (DESYREL ) 50 MG tablet Take 1 tablet (50 mg total) by mouth at bedtime    . valACYclovir  (VALTREX ) 1000 MG tablet Take 1,000 mg by mouth as needed     No current facility-administered medications on file prior to visit.   Past Medical History:  Past Medical History:  Diagnosis Date  . Acute respiratory failure with hypoxia (CMS/HHS-HCC) 10/25/2021   hypoxic to 86% with EMS requiring 4 L. Tachypneic to 34 on arrival O2 supplemented to keep sats over 94%, wean as tolerated  5/19:On BiPAP, weaned to  8 L/min HFNC oxygen 5/20:On BiPAP overnight, 13 L/min HFNC this morning 5/21:Weaned to 7 L/min HFNC, declined BiPAP, improvement in SOB. 5/22:required being placed back on BiPAP, and rising oxygen requirements overnight up to 15 L followed by BiPAP,  . Allergic rhinitis   . Aortic stenosis 01/2013   s/p bioprosthetic TAVR 10/2021  . Arthritis   . Depression   . Diabetes mellitus type 2, uncomplicated (CMS/HHS-HCC)   . GERD (gastroesophageal reflux disease)   . Heart murmur   . History of adenomatous polyp of colon 08/20/2015  . Hypertension   . Skin cancer, basal cell     Past Surgical History:  Past Surgical History:  Procedure Laterality Date  . TONSILLECTOMY  1960  . EGD  01/15/2002   Barrett's Esophagus  . Bone spur surgery  2007   from foot  . COLONOSCOPY  05/14/2008   05/23/2002; FH Colon Polyps (Mother/Sister): CBF 05/2013  . release of trigger finger Right 10/2010   Dr. Claudene  . EGD  02/23/2011   05/14/2008, 05/20/2004; No Barrett's Seen: No repeat per RTE  . FUNCTIONAL ENDOSCOPIC SINUS SURGERY  03/2013    Multile surgery Dr. Gretta. 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 COLONOSCOPY  11/17/2015   Adenomatous Polyp, FH Colon Polyps (Mother/Sister): CBF 11/2020  . EGD  11/17/2015   No Barrett's Seen: No repeat per RTE  . CATARACT EXTRACTION PHACO AND INTRAOCULAR LENS PLACEMENT (IOC) LEFT DIABETES VISION BLUE Left 01/16/2019   Dr. Redell Heady  . CATARACT EXTRACTION PHACO AND INTRAOCULAR LENS PLACEMENT (IOC)  02/06/2019   Dr. Redell Heady  . C3-4 ANTERIOR CERVICAL DECOMPRESSION/DISCECTOMY FUSION  11/15/2020   Dr. Elspeth Ahle at [facility] [place]  . RIGHT/LEFT HEART CATH AND CORONARY ANGIOGRAPHY  05/24/2021   Dr. Cara Lovelace  . INTRAVASCULAR PRESSURE WIRE/FFR STUDY  05/24/2021   Dr. Cara Lovelace  . CORONARY ARTERY BYPASS GRAFTING X1 Using Left Internal Mammory artery  09/19/2021   Dr. Dorise Fellers  . AORTIC VALVE REPLACEMENT Using 19mm Edwards Resilia Inspiris Valve  09/19/2021   Dr. Dorise Fellers  . TRANSESOPHAGEAL ECHOCARDIOGRAM (TEE)  09/19/2021   Dr. Dorise Fellers  . Basal cell tumors removed  1984, 1985  . Carpal tunnel surgery Bilateral    2008, 2009  . CHOLECYSTECTOMY    . COMBINED AUGMENTATION MAMMAPLASTY AND ABDOMINOPLASTY    . jaw surgery    . RHINOPLASTY     Family History:  Family History  Problem Relation Name Age of Onset  . High blood pressure (Hypertension) Mother    . Colon polyps Mother    . Lung cancer Mother         died of lung cancer  . Gallbladder disease Mother    . Alcohol abuse Mother    . Depression Mother    . Diabetes Father    . Colon polyps Sister    . Irritable bowel syndrome Sister    . Allergies Sister    . COPD Sister    . Stroke Brother    . Colon cancer Brother    . Skin cancer Daughter    . Breast cancer Maternal Aunt    . Lung cancer Maternal Grandmother     Social History:  Social History   Socioeconomic History  . Marital status: Married  Tobacco Use  . Smoking status: Never  . Smokeless tobacco: Never  Vaping Use  . Vaping  status: Never Used  Substance and Sexual Activity  . Alcohol use: No  . Drug use:  No   Social Drivers of Corporate investment banker Strain: Low Risk  (11/27/2023)   Received from [facility]   Overall Financial Resource Strain (CARDIA)   . How hard is it for you to pay for the very basics like food, housing, medical care, and heating?: Not hard at all  Food Insecurity: No Food Insecurity (03/03/2024)   Received from [facility]   Hunger Vital Sign   . Within the past 12 months, you worried that your food would run out before you got the money to buy more.: Never true   . Within the past 12 months, the food you bought just didn't last and you didn't have money to get more.: Never true  Transportation Needs: No Transportation Needs (11/27/2023)   Received from [facility] - Transportation   . In the past 12 months, has lack of transportation kept you from medical appointments or from getting medications?: No   . In the past 12 months, has lack of transportation kept you from meetings, work, or from getting things needed for daily living?: No  Physical Activity: Inactive (11/27/2023)   Received from [facility]   Exercise Vital Sign   . On average, how many days per week do you engage in moderate to strenuous exercise (like a brisk walk)?: 0 days  Stress: No Stress Concern Present (11/27/2023)   Received from [facility] - Occupational Stress Questionnaire   . Do you feel stress - tense, restless, nervous, or anxious, or unable to sleep at night because your mind is troubled all the time - these days?: Only a little  Social Connections: Socially Integrated (11/27/2023)   [facility]   Social Connection and Isolation Panel   . In a typical week, how many times do you talk on the phone with family, friends, or neighbors?: More than three times a week   . How often do you get together with friends or relatives?: Twice a week    . How often do you attend church or religious services?: More than 4 times per year   . Do you belong to any clubs or organizations such as church groups, unions, fraternal or athletic groups, or school groups?: Yes   . How often do you attend meetings of the clubs or organizations you belong to?: More than 4 times per year   . Are you married, widowed, divorced, separated, never married, or living with a partner?: Married  Housing Stability: Low Risk  (09/18/2023)   Housing Stability Vital Sign   . Unable to Pay for Housing in the Last Year: No   . Number of Times Moved in the Last Year: 0   . Homeless in the Last Year: No   Allergies:  Allergies  Allergen Reactions  . Dulaglutide  Nausea And Vomiting  . Semaglutide  Nausea And Vomiting  . Clarithromycin Hives    Upset stomach and terrible taste in mouth  . Doxycycline  Unknown  . Empagliflozin  Other (See Comments)    Abdominal upset   This note has been created using automated tools and reviewed for accuracy by ELIAS GREGORY RODRIGUEZ.  Attestation Statement:   I personally performed the service, non-incident to. (WP)   ELIAS CORDELLA STALLION, NP  Allyson STALLION, FNP-BC

## 2024-03-18 NOTE — Telephone Encounter (Signed)
 Received call from [place], of [facility], stating STAT imaging order by [name] for CT angio chest aorta would need to be changed to angio abd pelvis for the dx code and CPT codes noted in order.   Once order is changed, will need new PA and will be kicked back to central scheduling.   Will fwd to Dr Avelina to address in [name]'s absence. Will send to [place] as fyi.

## 2024-03-19 ENCOUNTER — Encounter: Payer: Self-pay | Admitting: Family

## 2024-03-19 ENCOUNTER — Telehealth: Payer: Self-pay | Admitting: Family

## 2024-03-19 ENCOUNTER — Other Ambulatory Visit: Payer: Self-pay | Admitting: Family

## 2024-03-19 DIAGNOSIS — R2689 Other abnormalities of gait and mobility: Secondary | ICD-10-CM | POA: Diagnosis not present

## 2024-03-19 DIAGNOSIS — R413 Other amnesia: Secondary | ICD-10-CM | POA: Diagnosis not present

## 2024-03-19 DIAGNOSIS — K219 Gastro-esophageal reflux disease without esophagitis: Secondary | ICD-10-CM

## 2024-03-19 DIAGNOSIS — H9193 Unspecified hearing loss, bilateral: Secondary | ICD-10-CM | POA: Diagnosis not present

## 2024-03-19 DIAGNOSIS — G629 Polyneuropathy, unspecified: Secondary | ICD-10-CM | POA: Diagnosis not present

## 2024-03-19 MED ORDER — SUCRALFATE 1 G PO TABS: 1.0000 g | ORAL_TABLET | Freq: Three times a day (TID) | ORAL | 0 refills | Status: DC

## 2024-03-19 NOTE — Telephone Encounter (Signed)
 Katie Buckley sent the Katie Buckley a MyChart message regard this additional CT. I called Katie Buckley to go over that message as she had not seen it.

## 2024-03-19 NOTE — Telephone Encounter (Signed)
 NOTED This has been addressed. Going to hold on the imaging for now Pt informed

## 2024-03-19 NOTE — Telephone Encounter (Signed)
 Spoke with pt via telephone. She is aware of this message.

## 2024-03-19 NOTE — Telephone Encounter (Signed)
 Copied from CRM #[id number]. Topic: General - Other >> Mar 19, 2024  8:43 AM Deaijah H wrote: Reason for CRM: Patient called in stating she went to hospital and could not have CT scan done due to discrepancy in order and what it was for. Need [name] to reach out and change whatever needs to be changed on order so she can have CT scan completed. Please call [phone]

## 2024-03-20 ENCOUNTER — Other Ambulatory Visit: Payer: Self-pay | Admitting: Family

## 2024-03-20 DIAGNOSIS — M6281 Muscle weakness (generalized): Secondary | ICD-10-CM | POA: Diagnosis not present

## 2024-03-20 DIAGNOSIS — R2681 Unsteadiness on feet: Secondary | ICD-10-CM | POA: Diagnosis not present

## 2024-03-22 LAB — CELIAC DISEASE PANEL
(tTG) Ab, IgA: <1 U/mL
(tTG) Ab, IgG: <1 U/mL
Gliadin IgA: <1 U/mL
Gliadin IgG: <1 U/mL
Immunoglobulin A: 37 mg/dL — ABNORMAL LOW (ref 70–320)

## 2024-03-22 LAB — ALPHA-GAL PANEL
Allergen, Mutton, f88: <0.1 kU/L
Allergen, Pork, f26: <0.1 kU/L
Beef: <0.1 kU/L
CLASS: 0
CLASS: 0
Class: 0
GALACTOSE-ALPHA-1,3-GALACTOSE IGE*: <0.1 kU/L (ref <0.10)

## 2024-03-22 LAB — INTERPRETATION:

## 2024-03-24 ENCOUNTER — Encounter: Payer: Self-pay | Admitting: Orthopedic Surgery

## 2024-03-24 DIAGNOSIS — R2681 Unsteadiness on feet: Secondary | ICD-10-CM | POA: Diagnosis not present

## 2024-03-24 DIAGNOSIS — M6281 Muscle weakness (generalized): Secondary | ICD-10-CM | POA: Diagnosis not present

## 2024-03-24 NOTE — Progress Notes (Signed)
 Perioperative / Anesthesia Services  Pre-Admission Testing Clinical Review / Pre-Operative Anesthesia Consult  Date: 03/24/24  PATIENT DEMOGRAPHICS: Name: Katie Buckley DOB: [birth year] MRN:   [id number]  Note: Available PAT nursing documentation and vital signs have been reviewed. Clinical nursing staff has updated patient's PMH/PSHx, current medication list, and drug allergies/intolerances to ensure complete and comprehensive history available to assist care teams in MDM as it pertains to the aforementioned surgical procedure and anticipated anesthetic course. Extensive review of available clinical information personally performed. Nursing documentation reviewed. [place] PMH and PSHx updated with any diagnoses and/or procedures that I have knowledge of that may have been inadvertently omitted during her intake with the pre-admission testing department's nursing staff.  PLANNED SURGICAL PROCEDURE(S):   Case: [id number] Date/Time: 03/25/24 0715   Procedures:      ARTHROPLASTY, SHOULDER, TOTAL, REVERSE (Left: Shoulder)     TENODESIS, BICEPS (Left: Shoulder)   Anesthesia type: Choice   Diagnosis: Other closed displaced fracture of proximal end of left humerus with routine healing, subsequent encounter [S42.292D]   Pre-op diagnosis: Other closed displaced fracture of proximal end of left humerus with routine healing, subsequent encounter S42.292D   Location: [facility] OR ROOM 01 / [facility]   Surgeons: [name] Priest, Katie Buckley        CLINICAL DISCUSSION: Katie Buckley is a 73 y.o. female who is submitted for pre-surgical anesthesia review and clearance prior to her undergoing the above procedure. Patient has never been a smoker in the past. Pertinent PMH includes: CAD (s/p CABG), aortic stenosis (s/p TAVR), PAF, HFpEF, cardiac murmur, aortic atherosclerosis, palpitations, LBBB, chronic cerebral microvascular disease, HTN, HLD, T2DM, DOE, pulmonary hypertension, GERD (on daily  PPI + Carafate), anemia, diabetic neuropathy, OA, cervical DDD (s/p ACDF C3-C4), lumbar DDD, anxiety, insomnia, mild cognitive impairment.  Patient is followed by cardiology Philippe, Katie Buckley). She was last seen in the cardiology clinic on 07/16/2023; notes reviewed. At the time of her clinic visit, patient doing well overall from a cardiovascular perspective. Patient denied any chest pain, shortness of breath, PND, orthopnea, palpitations, significant peripheral edema, weakness, fatigue, vertiginous symptoms, or presyncope/syncope. Patient with a past medical history significant for cardiovascular diagnoses. Documented physical exam was grossly benign, providing no evidence of acute exacerbation and/or decompensation of the patient's known cardiovascular conditions.  Patient underwent diagnostic RIGHT/LEFT heart catheterization on 05/24/2021 revealing a normal left ventricular systolic function with an EF of 60%.  There was single-vessel CAD with a 70% lesion noted in the mid to distal LAD.  FFR of the mid LAD lesion was 0.71 (ranges: < 0.75 high likelihood of hemodynamically significant stenosis, 0.76-0.80 borderline, > 0.80 normal). Severe aortic valve stenosis was observed; AVA (VTI) = 0.72 cm.  Hemodynamics: mean PA = 26 mmHg, mean PCWP = 15 mmHg, AO saturation = 96.6%, CO = 5.22 L/min, and CI 3.2 L/min/m.  Hemodynamic findings consistent with mild pulmonary hypertension.  Given the presence of coronary artery disease and severe aortic valve stenosis, patient referred to CVTS for consideration of valve replacement and management of her CAD.  Patient underwent single-vessel revascularization on 09/19/2021.  LIMA-LAD bypass graft was placed.  Concurrently with that procedure, patient underwent TAVR placing a 19 mm Edwards Resilia Inspiris bioprosthetic aortic valve.  Most recent TTE performed on 10/27/2021 revealed a normal left ventricular systolic function with an EF of 55-60%. There was no LVH.  There  were nonspecific regional wall motion abnormalities. Left ventricular diastolic Doppler parameters consistent with pseudonormalization (G2DD). Right ventricular  size and function normal.There was trivial mitral and tricuspid valve regurgitation.  Bioprosthetic aortic valve well-seated and functioning properly with a mean transvalvular gradient of 14 mmHg; AVA (VTI) = 1.04 cm.  All transvalvular gradients were noted to be normal providing no evidence of hemodynamically significant valvular stenosis. Aorta normal in size with no evidence of ectasia or aneurysmal dilatation.  Patient with an atrial fibrillation diagnosis; CHA2DS2-VASc Score = 6 (age, sex, HFpEF, HTN, vascular disease, T2DM). Her rate and rhythm are currently being maintained on oral metoprolol  tartrate. She is chronically anticoagulated using apixaban ; reported to be compliant with therapy with no evidence or reports of GI/GU bleeding. Blood pressure elevated at 150/58 mmHg on currently prescribed CCB (amlodipine ), diuretic (HCTZ), beta-blocker (metoprolol  tartrate), and ARB (irbesartan ) therapies.  Patient is on rosuvastatin  for her HLD diagnosis and ASCVD prevention. T2DM poorly controlled at the time of his cardiology visit; A1c was 10.7%. Since that time, patient has had A1c levels rechecked multiple times with the most recent being down to 6.5% when checked on 02/26/2024. She does not have an OSAH diagnosis. Patient is able to complete all of her  ADL/IADLs without cardiovascular limitation.  Per the DASI, patient is able to achieve at least 4 METS of physical activity without experiencing any significant degree of angina/anginal equivalent symptoms. No changes were made to her medication regimen during her visit with cardiology.  Patient scheduled to follow-up with outpatient cardiology in 12 months or sooner if needed.  Katie Buckley is scheduled for an elective ARTHROPLASTY, SHOULDER, TOTAL, REVERSE (Left: Shoulder); TENODESIS, BICEPS  (Left: Shoulder) on 03/25/2024 with Dr. Earnestine Blanch, Katie Buckley. Given patient's past medical history significant for cardiovascular diagnoses, presurgical cardiac clearance was sought by the PAT team. Per cardiology, this patient is optimized for surgery and may proceed with the planned procedural course with a LOW risk of significant perioperative cardiovascular complications.  Again, this patient is on daily oral anticoagulation therapy using a DOAC medication. She has been instructed on recommendations for holding her apixaban  for 3 days prior to her procedure with plans to restart as soon as postoperative bleeding risk felt to be minimized by his primary attending surgeon. The patient has been instructed that her last dose should be on 03/21/2024.  Patient denies previous perioperative complications with anesthesia in the past. In review her EMR, it is noted that patient underwent a general anesthetic course here at [facility] (ASA IV) in 12/2022 without documented complications.   MOST RECENT VITAL SIGNS:    03/18/2024   12:49 PM 03/18/2024   10:14 AM 02/26/2024   11:39 AM  Vitals with BMI  Height 5' 0 5' 0 5' 0  Weight 145 lbs 13 oz 146 lbs 144 lbs 10 oz  BMI 28.47 28.51 28.24  Systolic 169 134 881  Diastolic 57 70 72  Pulse 58 66 80   PROVIDERS/SPECIALISTS: NOTE: Primary physician provider listed below. Patient may have been seen by APP or partner within same practice.   PROVIDER ROLE / SPECIALTY LAST Katie Blanch Earnestine, Katie Buckley Orthopedics (Surgeon) 03/12/2024  Corwin Antu, FNP Primary Care Provider 03/18/2024  Florencio Shine, Katie Buckley Cardiology 07/16/2023   Maree Hila, Katie Buckley Neurology 03/19/2024  Agrawal, Kavita, Katie Buckley  Hematology 07/16/2023   ALLERGIES: Allergies  Allergen Reactions   Ozempic  (0.25 Or 0.5 Mg-Dose) [Semaglutide (0.25 Or 0.5mg -Dos)] Nausea And Vomiting   Trulicity  [Dulaglutide ] Nausea And Vomiting   Biaxin [Clarithromycin]     GI upset,  and bad taste in mouth.  Jardiance  [Empagliflozin ] Other (See Comments)    Abdominal upset     CURRENT HOME MEDICATIONS: No current facility-administered medications for this encounter.    acetaminophen  (TYLENOL ) 650 MG CR tablet   amLODipine  (NORVASC ) 5 MG tablet   apixaban  (ELIQUIS ) 5 MG TABS tablet   buPROPion  (WELLBUTRIN  SR) 150 MG 12 hr tablet   butalbital -acetaminophen -caffeine  (FIORICET) 50-325-40 MG tablet   cholecalciferol (VITAMIN D3) 25 MCG (1000 UNIT) tablet   ezetimibe  (ZETIA ) 10 MG tablet   ferrous sulfate 325 (65 FE) MG tablet   FLUoxetine (PROZAC) 40 MG capsule   glipiZIDE  (GLUCOTROL  XL) 2.5 MG 24 hr tablet   hydrochlorothiazide  (HYDRODIURIL ) 12.5 MG tablet   hydrOXYzine  (VISTARIL ) 25 MG capsule   insulin  degludec (TRESIBA  FLEXTOUCH) 100 UNIT/ML FlexTouch Pen   irbesartan  (AVAPRO ) 150 MG tablet   Melatonin 10 MG TABS   metFORMIN  (GLUCOPHAGE -XR) 500 MG 24 hr tablet   metoprolol  tartrate (LOPRESSOR ) 50 MG tablet   RABEprazole  (ACIPHEX ) 20 MG tablet   rosuvastatin  (CRESTOR ) 20 MG tablet   traZODone  (DESYREL ) 50 MG tablet   Blood Glucose Monitoring Suppl (CONTOUR NEXT MONITOR) w/Device KIT   Continuous Glucose Sensor (FREESTYLE LIBRE 3 PLUS SENSOR) MISC   glucose blood (CONTOUR NEXT TEST) test strip   Lancet Devices MISC   linagliptin  (TRADJENTA ) 5 MG TABS tablet   pregabalin  (LYRICA ) 75 MG capsule   sucralfate (CARAFATE) 1 g tablet   Vibegron  (GEMTESA ) 75 MG TABS   HISTORY: Past Medical History:  Diagnosis Date   (HFpEF) heart failure with preserved ejection fraction (HCC)    Anemia    Anxiety    Aortic atherosclerosis    Aortic stenosis 01/21/2013   a.) s/p TAVR (done concurrently with CABG) 09/19/2021 - 19 mm Edwards Resilia Inspiris bioprosthetic valve   Arthritis    Basal cell carcinoma of skin    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    Cerebral microvascular disease    Coronary artery disease    a.) 1v CABG 09/19/2021:  LIMA-LAD   DDD (degenerative disc disease), cervical    a.) s/p ACDF C3-C4 11/15/2020   DDD (degenerative disc disease), lumbar    Diabetic neuropathy (HCC)    Diabetic retinopathy (HCC)    DOE (dyspnea on exertion)    Frequent sinus infections    GERD (gastroesophageal reflux disease)    Heart murmur    History of Barrett's esophagus    History of colon polyps    Hypertension    Insomnia    a.) takes melatonin + trazodone  PRN   LBBB (left bundle branch block)    MCI (mild cognitive impairment)    MRSA (methicillin resistant staph aureus) culture positive 2018   PAF (paroxysmal atrial fibrillation) (HCC)    a.) CHA2DS2-VASc = 6 (age, sex, HFpEF, HTN, vascuar disease, T2DM) as of 03/24/2024; b.) rate/rhythm maintained on oral metoprolol  tartrate; chronically anticoagulated using apixaban    Palpitations    Pneumonia    Pulmonary hypertension (HCC)    Seasonal allergies    Squamous cell skin cancer    Status post bilateral cataract extraction    T2DM (type 2 diabetes mellitus) (HCC)    Tension headache    Past Surgical History:  Procedure Laterality Date   ANTERIOR CERVICAL DECOMP/DISCECTOMY FUSION N/A 11/15/2020   Procedure: ANTERIOR CERVICAL DECOMPRESSION/DISCECTOMY FUSION 1 LEVEL C3/4;  Surgeon: Bluford Standing, Katie Buckley;  Location: [facility] ORS;  Service: Neurosurgery;  Laterality: N/A;   AORTIC VALVE REPLACEMENT N/A 09/19/2021   Procedure: AORTIC VALVE REPLACEMENT  Using 19mm Edwards Resilia Inspiris Valve;  Surgeon: Lucas Dorise POUR, Katie Buckley;  Location: [facility] OR;  Service: Open Heart Surgery;  Laterality: N/A;   AUGMENTATION MAMMAPLASTY Bilateral    breast implants 1980s   BIOPSY  12/18/2022   Procedure: BIOPSY;  Surgeon: Therisa Bi, Katie Buckley;  Location: [facility] ENDOSCOPY;  Service: Gastroenterology;;   Bone Spur  2007   foot   BREAST IMPLANT REMOVAL Bilateral 10/30/2022   Procedure: Bilateral removal of breast implants with mastopexy;  Surgeon: Lowery Estefana RAMAN, DO;  Location: [facility] OR;  Service:  Plastics;  Laterality: Bilateral;   Carpal Tunnel Symdrome  2008   as repeated in 2009   CATARACT EXTRACTION W/PHACO Left 01/16/2019   Procedure: CATARACT EXTRACTION PHACO AND INTRAOCULAR LENS PLACEMENT (IOC) LEFT DIABETES VISION BLUE;  Surgeon: Ferol Rogue, Katie Buckley;  Location: [facility] ORS;  Service: Ophthalmology;  Laterality: Left;  US   00:47 CDE 6.62 Fluid pack lot # [id number]   CATARACT EXTRACTION W/PHACO Right 02/06/2019   Procedure: CATARACT EXTRACTION PHACO AND INTRAOCULAR LENS PLACEMENT (IOC);  Surgeon: Ferol Rogue, Katie Buckley;  Location: [facility] ORS;  Service: Ophthalmology;  Laterality: Right;  US  00:54.6 CDE 6.52 FLUID PACK LOT # [id number]   CHOLECYSTECTOMY  2007   COLONOSCOPY WITH PROPOFOL  N/A 11/17/2015   Procedure: COLONOSCOPY WITH PROPOFOL ;  Surgeon: Lamar ONEIDA Holmes, Katie Buckley;  Location: [facility] ENDOSCOPY;  Service: Endoscopy;  Laterality: N/A;   COLONOSCOPY WITH PROPOFOL  N/A 06/21/2022   Procedure: COLONOSCOPY WITH PROPOFOL ;  Surgeon: Therisa Bi, Katie Buckley;  Location: [facility] ENDOSCOPY;  Service: Gastroenterology;  Laterality: N/A;   CORONARY ARTERY BYPASS GRAFT N/A 09/19/2021   Procedure: CORONARY ARTERY BYPASS GRAFTING X1 Using Left Internal Mammory artery;  Surgeon: Lucas Dorise POUR, Katie Buckley;  [facility];  Service: Open Heart Surgery;  Laterality: N/A;   CORONARY PRESSURE/FFR STUDY N/A 05/24/2021   Procedure: INTRAVASCULAR PRESSURE WIRE/FFR STUDY;  Surgeon: Florencio Cara BIRCH, Katie Buckley;  Location: [facility] INVASIVE CV LAB;  Service: Cardiovascular;  Laterality: N/A;   ESOPHAGOGASTRODUODENOSCOPY (EGD) WITH PROPOFOL  N/A 11/17/2015   Procedure: ESOPHAGOGASTRODUODENOSCOPY (EGD) WITH PROPOFOL ;  Surgeon: Lamar ONEIDA Holmes, Katie Buckley;  Location: [facility] ENDOSCOPY;  Service: Endoscopy;  Laterality: N/A;   ESOPHAGOGASTRODUODENOSCOPY (EGD) WITH PROPOFOL  N/A 12/18/2022   Procedure: ESOPHAGOGASTRODUODENOSCOPY (EGD) WITH PROPOFOL ;  Surgeon: Therisa Bi, Katie Buckley;  Location: [facility] ENDOSCOPY;  Service: Gastroenterology;  Laterality: N/A;   MANDIBLE  SURGERY     MASTOPEXY Bilateral 10/30/2022   Procedure: MASTOPEXY;  Surgeon: Lowery Estefana RAMAN, DO;  Location: [facility] OR;  Service: Plastics;  Laterality: Bilateral;   NASAL SINUS SURGERY  03/2013   Multile surgery Dr. Gretta. 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 Claudene   RIGHT/LEFT HEART CATH AND CORONARY ANGIOGRAPHY N/A 05/24/2021   Procedure: RIGHT/LEFT HEART CATH AND CORONARY ANGIOGRAPHY;  Surgeon: Florencio Cara BIRCH, Katie Buckley;  Location: [facility] INVASIVE CV LAB;  Service: Cardiovascular;  Laterality: N/A;   TEE WITHOUT CARDIOVERSION N/A 09/19/2021   Procedure: TRANSESOPHAGEAL ECHOCARDIOGRAM (TEE);  Surgeon: Lucas Dorise POUR, Katie Buckley;  [facility] OR;  Service: Open Heart Surgery;  Laterality: N/A;   TONSILLECTOMY  1960   Family History  Problem Relation Age of Onset   Lung cancer Mother    Diabetes Father    Irritable bowel syndrome Sister    Throat cancer Brother    Breast cancer Daughter    Breast cancer Maternal Aunt    Social History   Tobacco Use   Smoking status: Never    Passive exposure: Never   Smokeless  tobacco: Never  Substance Use Topics   Alcohol use: No   LABS:  Lab Results  Component Value Date   WBC 6.8 03/18/2024   HGB 11.5 (L) 03/18/2024   HCT 35.1 (L) 03/18/2024   MCV 83.8 03/18/2024   PLT 127.0 (L) 03/18/2024   Lab Results  Component Value Date   NA 139 03/18/2024   CL 101 03/18/2024   K 4.7 03/18/2024   CO2 32 03/18/2024   BUN 11 03/18/2024   CREATININE 0.75 03/18/2024   GFR 79.44 03/18/2024   CALCIUM  9.8 03/18/2024   PHOS 4.1 11/01/2021   ALBUMIN  4.2 03/18/2024   GLUCOSE 137 (H) 03/18/2024    ECG: Date: 02/18/2024 Time ECG obtained: 1303 PM Rate: 55 bpm Rhythm: sinus bradycardia Axis (leads I and aVF): normal Intervals: PR 162 ms. QRS 106 ms. QTc 466 ms. ST segment and T wave changes: No evidence of acute T wave abnormalities or significant ST segment elevation or depression.  Evidence of a possible,  age undetermined, prior infarct:  Yes; anterolateral Comparison: Previous tracing obtained on 10/24/2022 showed sinus bradycardia with PACs and ILBBB   IMAGING / PROCEDURES: CT ABDOMEN PELVIS W CONTRAST performed on 03/18/2024 No acute findings in the abdomen or pelvis. Systemic atherosclerosis, including aorta and iliac arteries. Mild atheromatous plaque at the celiac trunk and SMA origins without occlusion. Mild lumbar spondylosis and degenerative disc disease. Mitral annular calcification.  CT SHOULDER LEFT WO CONTRAST performed on 03/07/2024 Interval further osseous healing of the previously demonstrated left humeral neck fracture with stable residual posttraumatic deformity. No evidence of acute fracture, dislocation or humeral head osteonecrosis. Narrowing of the subacromial space and possible supraspinatus tendon impingement. Aortic atherosclerosis  MR BRAIN WO CONTRAST performed on 02/15/2024 No acute intracranial abnormality. Mild chronic microvascular ischemic changes, slightly increased since 2021. Chronic microhemorrhages in the left cerebellum and anterior left frontal lobe, new from prior.  TRANSTHORACIC ECHOCARDIOGRAM performed on 10/27/2021 Left ventricular ejection fraction, by estimation, is 55 to 60%. The left ventricle has normal function. The left ventricle demonstrates regional wall motion abnormalities (see scoring diagram/findings for description). Left ventricular diastolic parameters are consistent with Grade II diastolic dysfunction (pseudonormalization).  Right ventricular systolic function is normal. The right ventricular size is normal.  The mitral valve is normal in structure. Trivial mitral valve regurgitation.  The aortic valve is normal in structure. Aortic valve regurgitation is not visualized.   CT CORONARY MORPH W/CTA COR W/SCORE W/CA W/CM &/OR WO/CM performed on 06/23/2021 Small annular measurements noted (321 mm2). Findings support a 20 mm S3 TAVR.  A 26 mm Evolut Pro not supported due to small sinus of Valsalva measurements. Would recommend structural heart team discussion for valve sizing. No significant annular or subannular calcifications. Sufficient coronary to annulus distance. Optimal Fluoroscopic Angle for Delivery: LAO 14 CAU 3  RIGHT/LEFT HEART CATHETERIZATION AND CORONARY ANGIOGRAPHY performed on 05/24/2021 Normal left ventricular systolic function with an EF of 70% Single-vessel coronary artery disease with a 70% stenosis of the mid to distal LAD.  FFR of the mid LAD was positive at 0.71. Severe aortic valve stenosis with 2+ regurgitation.  AVA (VTI) 0.72 cm Mean PCWP = 15 mmHg Mean PA = 26 mmHg Recommendations: refer to tertiary care center for evaluation of TAVR versus SAVR and revascularization.    IMPRESSION AND PLAN: Katie Buckley has been referred for pre-anesthesia review and clearance prior to her undergoing the planned anesthetic and procedural courses. Available labs, pertinent testing, and imaging results were personally reviewed  by me in preparation for upcoming operative/procedural course. [facility] medical record has been updated following extensive record review and patient interview with PAT staff.   This patient has been appropriately cleared by cardiology with an overall LOW risk of patient experiencing significant perioperative cardiovascular complications. Based on clinical review performed today (03/24/24), barring any significant acute changes in the patient's overall condition, it is anticipated that she will be able to proceed with the planned surgical intervention. Any acute changes in clinical condition may necessitate her procedure being postponed and/or cancelled. Patient will meet with anesthesia team (Katie Buckley and/or CRNA) on the day of her procedure for preoperative evaluation/assessment. Questions regarding anesthetic course will be fielded at that time.   Pre-surgical instructions were reviewed  with the patient during his PAT appointment, and questions were fielded to satisfaction by PAT clinical staff. She has been instructed on which medications that she will need to hold prior to surgery, as well as the ones that have been deemed safe/appropriate to take on the day of her procedure. As part of the general education provided by PAT, patient made aware both verbally and in writing, that she would need to abstain from the use of any illegal substances during her perioperative course. She was advised that failure to follow the provided instructions could necessitate case cancellation or result in serious perioperative complications up to and including death. Patient encouraged to contact PAT and/or her surgeon's office to discuss any questions or concerns that may arise prior to surgery; verbalized understanding.   Dorise Pereyra, MSN, APRN, FNP-C, CEN [facility]  Perioperative Services Nurse Practitioner Phone: [phone] Fax: [phone] 03/24/24 7:22 PM  NOTE: This note has been prepared using Dragon dictation software. Despite my best ability to proofread, there is always the potential that unintentional transcriptional errors may still occur from this process.

## 2024-03-25 ENCOUNTER — Ambulatory Visit: Payer: Self-pay | Admitting: Urgent Care

## 2024-03-25 ENCOUNTER — Other Ambulatory Visit: Payer: Self-pay

## 2024-03-25 ENCOUNTER — Ambulatory Visit
Admission: RE | Admit: 2024-03-25 | Discharge: 2024-03-25 | Disposition: A | Discharge status: Home / Self Care | Source: Ambulatory Visit | Attending: Orthopedic Surgery | Admitting: Orthopedic Surgery

## 2024-03-25 ENCOUNTER — Ambulatory Visit

## 2024-03-25 ENCOUNTER — Encounter
Admission: RE | Disposition: A | Discharge status: Home / Self Care | Payer: Self-pay | Source: Ambulatory Visit | Attending: Orthopedic Surgery

## 2024-03-25 ENCOUNTER — Encounter: Payer: Self-pay | Admitting: Orthopedic Surgery

## 2024-03-25 DIAGNOSIS — G8918 Other acute postprocedural pain: Secondary | ICD-10-CM | POA: Diagnosis not present

## 2024-03-25 DIAGNOSIS — Z7984 Long term (current) use of oral hypoglycemic drugs: Secondary | ICD-10-CM | POA: Diagnosis not present

## 2024-03-25 DIAGNOSIS — I700 Atherosclerosis of aorta: Secondary | ICD-10-CM | POA: Insufficient documentation

## 2024-03-25 DIAGNOSIS — I110 Hypertensive heart disease with heart failure: Secondary | ICD-10-CM | POA: Insufficient documentation

## 2024-03-25 DIAGNOSIS — M5031 Other cervical disc degeneration,  high cervical region: Secondary | ICD-10-CM | POA: Diagnosis not present

## 2024-03-25 DIAGNOSIS — E785 Hyperlipidemia, unspecified: Secondary | ICD-10-CM | POA: Insufficient documentation

## 2024-03-25 DIAGNOSIS — S42292A Other displaced fracture of upper end of left humerus, initial encounter for closed fracture: Secondary | ICD-10-CM | POA: Diagnosis not present

## 2024-03-25 DIAGNOSIS — S42292P Other displaced fracture of upper end of left humerus, subsequent encounter for fracture with malunion: Secondary | ICD-10-CM | POA: Diagnosis not present

## 2024-03-25 DIAGNOSIS — R011 Cardiac murmur, unspecified: Secondary | ICD-10-CM | POA: Insufficient documentation

## 2024-03-25 DIAGNOSIS — Z79899 Other long term (current) drug therapy: Secondary | ICD-10-CM | POA: Diagnosis not present

## 2024-03-25 DIAGNOSIS — I2510 Atherosclerotic heart disease of native coronary artery without angina pectoris: Secondary | ICD-10-CM | POA: Diagnosis not present

## 2024-03-25 DIAGNOSIS — I272 Pulmonary hypertension, unspecified: Secondary | ICD-10-CM | POA: Insufficient documentation

## 2024-03-25 DIAGNOSIS — Z7901 Long term (current) use of anticoagulants: Secondary | ICD-10-CM | POA: Insufficient documentation

## 2024-03-25 DIAGNOSIS — Z471 Aftercare following joint replacement surgery: Secondary | ICD-10-CM | POA: Diagnosis not present

## 2024-03-25 DIAGNOSIS — E1140 Type 2 diabetes mellitus with diabetic neuropathy, unspecified: Secondary | ICD-10-CM | POA: Insufficient documentation

## 2024-03-25 DIAGNOSIS — I5033 Acute on chronic diastolic (congestive) heart failure: Secondary | ICD-10-CM | POA: Diagnosis not present

## 2024-03-25 DIAGNOSIS — F419 Anxiety disorder, unspecified: Secondary | ICD-10-CM | POA: Insufficient documentation

## 2024-03-25 DIAGNOSIS — G3184 Mild cognitive impairment, so stated: Secondary | ICD-10-CM | POA: Diagnosis not present

## 2024-03-25 DIAGNOSIS — Z981 Arthrodesis status: Secondary | ICD-10-CM | POA: Diagnosis not present

## 2024-03-25 DIAGNOSIS — I480 Paroxysmal atrial fibrillation: Secondary | ICD-10-CM | POA: Diagnosis not present

## 2024-03-25 DIAGNOSIS — Z96612 Presence of left artificial shoulder joint: Secondary | ICD-10-CM | POA: Diagnosis not present

## 2024-03-25 DIAGNOSIS — M51369 Other intervertebral disc degeneration, lumbar region without mention of lumbar back pain or lower extremity pain: Secondary | ICD-10-CM | POA: Insufficient documentation

## 2024-03-25 DIAGNOSIS — K219 Gastro-esophageal reflux disease without esophagitis: Secondary | ICD-10-CM | POA: Diagnosis not present

## 2024-03-25 DIAGNOSIS — Z951 Presence of aortocoronary bypass graft: Secondary | ICD-10-CM | POA: Insufficient documentation

## 2024-03-25 DIAGNOSIS — I447 Left bundle-branch block, unspecified: Secondary | ICD-10-CM | POA: Diagnosis not present

## 2024-03-25 DIAGNOSIS — I5032 Chronic diastolic (congestive) heart failure: Secondary | ICD-10-CM | POA: Diagnosis not present

## 2024-03-25 DIAGNOSIS — W06XXXD Fall from bed, subsequent encounter: Secondary | ICD-10-CM | POA: Diagnosis not present

## 2024-03-25 DIAGNOSIS — Z01818 Encounter for other preprocedural examination: Secondary | ICD-10-CM

## 2024-03-25 DIAGNOSIS — S42202P Unspecified fracture of upper end of left humerus, subsequent encounter for fracture with malunion: Secondary | ICD-10-CM | POA: Diagnosis not present

## 2024-03-25 DIAGNOSIS — F418 Other specified anxiety disorders: Secondary | ICD-10-CM | POA: Diagnosis not present

## 2024-03-25 HISTORY — DX: Unspecified diastolic (congestive) heart failure: I50.30

## 2024-03-25 HISTORY — DX: Other forms of dyspnea: R06.09

## 2024-03-25 HISTORY — DX: Other cerebrovascular disease: I67.89

## 2024-03-25 HISTORY — DX: Pulmonary hypertension, unspecified: I27.20

## 2024-03-25 HISTORY — DX: Basal cell carcinoma of skin, unspecified: C44.91

## 2024-03-25 HISTORY — PX: BICEPT TENODESIS: SHX5116

## 2024-03-25 HISTORY — DX: Cataract extraction status, left eye: Z98.41

## 2024-03-25 HISTORY — DX: Insomnia, unspecified: G47.00

## 2024-03-25 HISTORY — PX: REVERSE SHOULDER ARTHROPLASTY: SHX5054

## 2024-03-25 HISTORY — DX: Paroxysmal atrial fibrillation: I48.0

## 2024-03-25 HISTORY — DX: Unspecified hearing loss, bilateral: H91.93

## 2024-03-25 HISTORY — DX: Left bundle-branch block, unspecified: I44.7

## 2024-03-25 HISTORY — DX: Other cervical disc degeneration, unspecified cervical region: M50.30

## 2024-03-25 HISTORY — DX: Other intervertebral disc degeneration, lumbar region without mention of lumbar back pain or lower extremity pain: M51.369

## 2024-03-25 HISTORY — DX: Tension-type headache, unspecified, not intractable: G44.209

## 2024-03-25 HISTORY — DX: Squamous cell carcinoma of skin, unspecified: C44.92

## 2024-03-25 HISTORY — DX: Mild cognitive impairment of uncertain or unknown etiology: G31.84

## 2024-03-25 HISTORY — DX: Atherosclerosis of aorta: I70.0

## 2024-03-25 LAB — GLUCOSE, CAPILLARY
Glucose-Capillary: 77 mg/dL (ref 70–99)
Glucose-Capillary: 204 mg/dL — ABNORMAL HIGH (ref 70–99)

## 2024-03-25 SURGERY — ARTHROPLASTY, SHOULDER, TOTAL, REVERSE
Anesthesia: General | Site: Shoulder | Laterality: Left

## 2024-03-25 MED ORDER — 0.9 % SODIUM CHLORIDE (POUR BTL) OPTIME
TOPICAL | Status: DC | PRN
  Administered 2024-03-25: 500 mL

## 2024-03-25 MED ORDER — VASOPRESSIN 20 UNIT/ML IV SOLN
INTRAVENOUS | Status: DC | PRN
  Administered 2024-03-25 (×3): 1 [IU] via INTRAVENOUS

## 2024-03-25 MED ORDER — CEFAZOLIN SODIUM-DEXTROSE 2-4 GM/100ML-% IV SOLN
2.0000 g | Freq: Once | INTRAVENOUS | Status: AC
  Administered 2024-03-25: 2 g via INTRAVENOUS

## 2024-03-25 MED ORDER — FENTANYL CITRATE (PF) 100 MCG/2ML IJ SOLN
INTRAMUSCULAR | Status: AC
  Filled 2024-03-25: qty 2

## 2024-03-25 MED ORDER — ACETAMINOPHEN 500 MG PO TABS
1000.0000 mg | ORAL_TABLET | Freq: Four times a day (QID) | ORAL | Status: AC | PRN
  Administered 2024-03-25: 1000 mg via ORAL

## 2024-03-25 MED ORDER — ROCURONIUM BROMIDE 100 MG/10ML IV SOLN
INTRAVENOUS | Status: DC | PRN
  Administered 2024-03-25: 10 mg via INTRAVENOUS
  Administered 2024-03-25: 60 mg via INTRAVENOUS
  Administered 2024-03-25: 10 mg via INTRAVENOUS

## 2024-03-25 MED ORDER — DEXTROSE 50 % IV SOLN
12.5000 g | Freq: Once | INTRAVENOUS | Status: AC
  Administered 2024-03-25: 12.5 g via INTRAVENOUS

## 2024-03-25 MED ORDER — OXYCODONE HCL 5 MG PO TABS: 5.0000 mg | ORAL_TABLET | ORAL | 0 refills | Status: DC | PRN

## 2024-03-25 MED ORDER — ACETAMINOPHEN 10 MG/ML IV SOLN: 1000.0000 mg | Freq: Once | INTRAVENOUS | Status: DC | PRN

## 2024-03-25 MED ORDER — TRANEXAMIC ACID-NACL 1000-0.7 MG/100ML-% IV SOLN
INTRAVENOUS | Status: AC
  Filled 2024-03-25: qty 100

## 2024-03-25 MED ORDER — ONDANSETRON 4 MG PO TBDP: 4.0000 mg | ORAL_TABLET | Freq: Three times a day (TID) | ORAL | 0 refills | Status: AC | PRN

## 2024-03-25 MED ORDER — BUPIVACAINE LIPOSOME 1.3 % IJ SUSP
INTRAMUSCULAR | Status: DC | PRN
  Administered 2024-03-25: 20 mL via PERINEURAL

## 2024-03-25 MED ORDER — TRANEXAMIC ACID-NACL 1000-0.7 MG/100ML-% IV SOLN
1000.0000 mg | INTRAVENOUS | Status: AC
Start: 2024-03-25 — End: 2024-03-25
  Administered 2024-03-25: 1000 mg via INTRAVENOUS

## 2024-03-25 MED ORDER — EPHEDRINE 5 MG/ML INJ
INTRAVENOUS | Status: AC
  Filled 2024-03-25: qty 5

## 2024-03-25 MED ORDER — VANCOMYCIN HCL 1000 MG IV SOLR
INTRAVENOUS | Status: DC | PRN
  Administered 2024-03-25: 1000 mg via TOPICAL

## 2024-03-25 MED ORDER — CEFAZOLIN SODIUM-DEXTROSE 2-4 GM/100ML-% IV SOLN
INTRAVENOUS | Status: AC
  Filled 2024-03-25: qty 100

## 2024-03-25 MED ORDER — BUPIVACAINE HCL (PF) 0.5 % IJ SOLN
INTRAMUSCULAR | Status: AC
  Filled 2024-03-25: qty 10

## 2024-03-25 MED ORDER — DEXTROSE 50 % IV SOLN
INTRAVENOUS | Status: AC
  Filled 2024-03-25: qty 50

## 2024-03-25 MED ORDER — FENTANYL CITRATE (PF) 100 MCG/2ML IJ SOLN: 25.0000 ug | INTRAMUSCULAR | Status: DC | PRN

## 2024-03-25 MED ORDER — ACETAMINOPHEN 500 MG PO TABS
ORAL_TABLET | ORAL | Status: AC
  Filled 2024-03-25: qty 2

## 2024-03-25 MED ORDER — MIDAZOLAM HCL 2 MG/2ML IJ SOLN
1.0000 mg | INTRAMUSCULAR | Status: AC | PRN
  Administered 2024-03-25 (×2): 1 mg via INTRAVENOUS

## 2024-03-25 MED ORDER — ACETAMINOPHEN 10 MG/ML IV SOLN
INTRAVENOUS | Status: DC | PRN
  Administered 2024-03-25: 1000 mg via INTRAVENOUS

## 2024-03-25 MED ORDER — CEFAZOLIN SODIUM-DEXTROSE 2-4 GM/100ML-% IV SOLN
2.0000 g | INTRAVENOUS | Status: AC
  Administered 2024-03-25: 2 g via INTRAVENOUS

## 2024-03-25 MED ORDER — OXYCODONE HCL 5 MG PO TABS
5.0000 mg | ORAL_TABLET | Freq: Once | ORAL | Status: AC | PRN
  Administered 2024-03-25: 5 mg via ORAL

## 2024-03-25 MED ORDER — FENTANYL CITRATE (PF) 100 MCG/2ML IJ SOLN
25.0000 ug | INTRAMUSCULAR | Status: DC | PRN
  Administered 2024-03-25 (×2): 25 ug via INTRAVENOUS

## 2024-03-25 MED ORDER — DEXAMETHASONE SOD PHOSPHATE PF 10 MG/ML IJ SOLN
INTRAMUSCULAR | Status: DC | PRN
  Administered 2024-03-25: 10 mg via INTRAVENOUS

## 2024-03-25 MED ORDER — SUGAMMADEX SODIUM 200 MG/2ML IV SOLN
INTRAVENOUS | Status: DC | PRN
Start: 2024-03-25 — End: 2024-03-25
  Administered 2024-03-25 (×2): 100 mg via INTRAVENOUS

## 2024-03-25 MED ORDER — ONDANSETRON HCL 4 MG/2ML IJ SOLN
INTRAMUSCULAR | Status: DC | PRN
  Administered 2024-03-25: 4 mg via INTRAVENOUS

## 2024-03-25 MED ORDER — VANCOMYCIN HCL 1000 MG IV SOLR
INTRAVENOUS | Status: AC
Start: 2024-03-25 — End: 2024-03-25
  Filled 2024-03-25: qty 40

## 2024-03-25 MED ORDER — DROPERIDOL 2.5 MG/ML IJ SOLN: 0.6250 mg | Freq: Once | INTRAMUSCULAR | Status: DC | PRN

## 2024-03-25 MED ORDER — LIDOCAINE HCL (CARDIAC) PF 100 MG/5ML IV SOSY
PREFILLED_SYRINGE | INTRAVENOUS | Status: DC | PRN
  Administered 2024-03-25: 100 mg via INTRAVENOUS

## 2024-03-25 MED ORDER — ACETAMINOPHEN 500 MG PO TABS: 1000.0000 mg | ORAL_TABLET | Freq: Three times a day (TID) | ORAL | 2 refills | Status: AC

## 2024-03-25 MED ORDER — SODIUM CHLORIDE (PF) 0.9 % IJ SOLN
INTRAMUSCULAR | Status: AC
  Filled 2024-03-25: qty 20

## 2024-03-25 MED ORDER — BUPIVACAINE HCL (PF) 0.5 % IJ SOLN
INTRAMUSCULAR | Status: DC | PRN
  Administered 2024-03-25: 10 mL via PERINEURAL

## 2024-03-25 MED ORDER — CEFAZOLIN SODIUM-DEXTROSE 2-4 GM/100ML-% IV SOLN: 2.0000 g | Freq: Once | INTRAVENOUS | Status: DC

## 2024-03-25 MED ORDER — SODIUM CHLORIDE 0.9 % IR SOLN
Status: DC | PRN
  Administered 2024-03-25: 1000 mL

## 2024-03-25 MED ORDER — EPHEDRINE SULFATE-NACL 50-0.9 MG/10ML-% IV SOSY
PREFILLED_SYRINGE | INTRAVENOUS | Status: DC | PRN
  Administered 2024-03-25 (×2): 5 mg via INTRAVENOUS

## 2024-03-25 MED ORDER — CHLORHEXIDINE GLUCONATE 0.12 % MT SOLN
15.0000 mL | Freq: Once | OROMUCOSAL | Status: AC
  Administered 2024-03-25: 15 mL via OROMUCOSAL

## 2024-03-25 MED ORDER — MIDAZOLAM HCL 2 MG/2ML IJ SOLN
INTRAMUSCULAR | Status: AC
Start: 2024-03-25 — End: 2024-03-25
  Filled 2024-03-25: qty 2

## 2024-03-25 MED ORDER — BUPIVACAINE LIPOSOME 1.3 % IJ SUSP
INTRAMUSCULAR | Status: AC
  Filled 2024-03-25: qty 20

## 2024-03-25 MED ORDER — TRANEXAMIC ACID-NACL 1000-0.7 MG/100ML-% IV SOLN
1000.0000 mg | INTRAVENOUS | Status: DC
Start: 2024-03-25 — End: 2024-03-25

## 2024-03-25 MED ORDER — OXYCODONE HCL 5 MG/5ML PO SOLN: 5.0000 mg | Freq: Once | ORAL | Status: DC | PRN

## 2024-03-25 MED ORDER — GLYCOPYRROLATE 0.2 MG/ML IJ SOLN
INTRAMUSCULAR | Status: DC | PRN
Start: 2024-03-25 — End: 2024-03-25
  Administered 2024-03-25: .2 mg via INTRAVENOUS

## 2024-03-25 MED ORDER — IRRISEPT - 450ML BOTTLE WITH 0.05% CHG IN STERILE WATER, USP 99.95% OPTIME
TOPICAL | Status: DC | PRN
  Administered 2024-03-25: 450 mL

## 2024-03-25 MED ORDER — OXYCODONE HCL 5 MG PO TABS
ORAL_TABLET | ORAL | Status: AC
  Filled 2024-03-25: qty 1

## 2024-03-25 MED ORDER — OXYCODONE HCL 5 MG PO TABS: 5.0000 mg | ORAL_TABLET | Freq: Once | ORAL | Status: DC | PRN

## 2024-03-25 MED ORDER — FENTANYL CITRATE (PF) 100 MCG/2ML IJ SOLN
INTRAMUSCULAR | Status: DC | PRN
  Administered 2024-03-25 (×2): 50 ug via INTRAVENOUS

## 2024-03-25 MED ORDER — LIDOCAINE HCL (PF) 1 % IJ SOLN
INTRAMUSCULAR | Status: DC | PRN
  Administered 2024-03-25: 3 mL via SUBCUTANEOUS

## 2024-03-25 MED ORDER — SODIUM CHLORIDE 0.9 % IV SOLN: INTRAVENOUS | Status: DC

## 2024-03-25 MED ORDER — OXYCODONE HCL 5 MG/5ML PO SOLN: 5.0000 mg | Freq: Once | ORAL | Status: AC | PRN

## 2024-03-25 MED ORDER — PROPOFOL 10 MG/ML IV BOLUS
INTRAVENOUS | Status: DC | PRN
  Administered 2024-03-25: 100 mg via INTRAVENOUS

## 2024-03-25 MED ORDER — ORAL CARE MOUTH RINSE: 15.0000 mL | Freq: Once | OROMUCOSAL | Status: AC

## 2024-03-25 MED ORDER — PROPOFOL 10 MG/ML IV BOLUS
INTRAVENOUS | Status: AC
Start: 2024-03-25 — End: 2024-03-25
  Filled 2024-03-25: qty 40

## 2024-03-25 MED ORDER — LIDOCAINE HCL (PF) 1 % IJ SOLN
INTRAMUSCULAR | Status: AC
  Filled 2024-03-25: qty 5

## 2024-03-25 MED ORDER — CHLORHEXIDINE GLUCONATE 0.12 % MT SOLN
OROMUCOSAL | Status: AC
  Filled 2024-03-25: qty 15

## 2024-03-25 SURGICAL SUPPLY — 64 items
BASEPLATE GLENOSPHERE 25 STD (Miscellaneous) IMPLANT
BIT DRILL 12.7X2STRG SHNK (BIT) IMPLANT
BIT DRILL 3.2 PERIPHERAL SCREW (BIT) IMPLANT
BLADE SAGITTAL WIDE XTHICK NO (BLADE) ×1 IMPLANT
CHLORAPREP W/TINT 26 (MISCELLANEOUS) ×1 IMPLANT
COOLER ICEMAN CLASSIC (MISCELLANEOUS) ×1 IMPLANT
CUP HUM SYS INSERT SZ 1/2 36 (Joint) IMPLANT
DERMABOND ADVANCED .7 DNX12 (GAUZE/BANDAGES/DRESSINGS) IMPLANT
DRAPE INCISE IOBAN 66X45 STRL (DRAPES) ×1 IMPLANT
DRAPE SHEET LG 3/4 BI-LAMINATE (DRAPES) ×1 IMPLANT
DRAPE TABLE BACK 80X90 (DRAPES) ×1 IMPLANT
DRSG OPSITE POSTOP 4X8 (GAUZE/BANDAGES/DRESSINGS) ×1 IMPLANT
DRSG TEGADERM 2-3/8X2-3/4 SM (GAUZE/BANDAGES/DRESSINGS) ×1 IMPLANT
ELECTRODE REM PT RTRN 9FT ADLT (ELECTROSURGICAL) ×1 IMPLANT
EVACUATOR 1/8 PVC DRAIN (DRAIN) ×1 IMPLANT
GAUZE SPONGE 2X2 STRL 8-PLY (GAUZE/BANDAGES/DRESSINGS) ×1 IMPLANT
GAUZE XEROFORM 1X8 LF (GAUZE/BANDAGES/DRESSINGS) IMPLANT
GLENOSPHERE REV SHOULDER 36 (Joint) IMPLANT
GLOVE BIOGEL PI IND STRL 8 (GLOVE) ×2 IMPLANT
GLOVE ORTHO TXT STRL SZ7.5 (GLOVE) ×2 IMPLANT
GLOVE SURG ORTHO 8.0 STRL STRW (GLOVE) ×2 IMPLANT
GOWN SRG LRG LVL 4 IMPRV REINF (GOWNS) ×2 IMPLANT
GOWN SRG XL LONG LVL 3 NONREIN (GOWNS) ×1 IMPLANT
GUIDEWIRE GLENOID 2.5X220 (WIRE) IMPLANT
HOOD PEEL AWAY T7 (MISCELLANEOUS) ×3 IMPLANT
IV 0.9% NACL 1000 ML (IV SOLUTION) ×1 IMPLANT
KIT STABILIZATION SHOULDER (MISCELLANEOUS) ×1 IMPLANT
LAVAGE JET IRRISEPT WOUND (IRRIGATION / IRRIGATOR) IMPLANT
MANIFOLD NEPTUNE II (INSTRUMENTS) ×1 IMPLANT
MASK FACE SPIDER DISP (MASK) ×1 IMPLANT
MAT ABSORB FLUID 56X50 GRAY (MISCELLANEOUS) ×1 IMPLANT
NDL REVERSE CUT 1/2 CRC (NEEDLE) IMPLANT
NDL SPNL 20GX3.5 QUINCKE YW (NEEDLE) IMPLANT
NEEDLE REVERSE CUT 1/2 CRC (NEEDLE) ×1 IMPLANT
NEEDLE SPNL 20GX3.5 QUINCKE YW (NEEDLE) IMPLANT
NS IRRIG 500ML POUR BTL (IV SOLUTION) ×1 IMPLANT
PACK ARTHROSCOPY SHOULDER (MISCELLANEOUS) ×1 IMPLANT
PAD COLD SHLDR SM WRAP-ON (PAD) ×1 IMPLANT
PENCIL SMOKE EVACUATOR (MISCELLANEOUS) ×1 IMPLANT
PIN GUIDE 3X75 SHOULDER (PIN) IMPLANT
SCREW BONE 6.5 OD 30 NON BIO (Screw) IMPLANT
SCREW PERIPHERAL 30 (Screw) IMPLANT
SLING ULTRA II LG (MISCELLANEOUS) IMPLANT
SLING ULTRA II M (MISCELLANEOUS) IMPLANT
SOLN STERILE WATER 1000 ML (IV SOLUTION) ×1 IMPLANT
SOLN STERILE WATER BTL 1000 ML (IV SOLUTION) ×1 IMPLANT
SPONGE T-LAP 18X18 ~~LOC~~+RFID (SPONGE) ×2 IMPLANT
STAPLER SKIN PROX 35W (STAPLE) IMPLANT
STEM HUMERAL PLUS LONG 1+ (Orthopedic Implant) IMPLANT
STRAP SAFETY 5IN WIDE (MISCELLANEOUS) ×1 IMPLANT
SUT BONE WAX W31G (SUTURE) IMPLANT
SUT MNCRL AB 4-0 PS2 18 (SUTURE) IMPLANT
SUT PROLENE 6 0 P 1 18 (SUTURE) IMPLANT
SUT TICRON 2-0 30IN 311381 (SUTURE) ×2 IMPLANT
SUT VIC AB 0 CT1 36 (SUTURE) ×1 IMPLANT
SUT VIC AB 2-0 CT2 27 (SUTURE) ×2 IMPLANT
SUT XBRAID 1.4 BLK/WHT (SUTURE) IMPLANT
SUT XBRAID 1.4 BLUE (SUTURE) IMPLANT
SUT XBRAID 1.4 WHITE/BLUE (SUTURE) IMPLANT
SUT XBRAID 2 BLACK/BLUE (SUTURE) IMPLANT
SUTURE ETHBND 5-0 MS/4 CCS GRN (SUTURE) IMPLANT
SUTURE FIBERWR #2 38 BLUE 1/2 (SUTURE) ×4 IMPLANT
TIP FAN IRRIG PULSAVAC PLUS (DISPOSABLE) ×1 IMPLANT
TRAP FLUID SMOKE EVACUATOR (MISCELLANEOUS) ×1 IMPLANT

## 2024-03-25 NOTE — Anesthesia Preprocedure Evaluation (Signed)
 Anesthesia Evaluation  Patient identified by MRN, date of birth, ID band Patient awake    Reviewed: Allergy & Precautions, H&P , NPO status , Patient's Chart, lab work & pertinent test results, reviewed documented beta blocker date and time   Airway Mallampati: II  TM Distance: >3 FB Neck ROM: full    Dental  (+) Teeth Intact   Pulmonary neg shortness of breath, asthma , neg sleep apnea, pneumonia, resolved, neg recent URI   Pulmonary exam normal        Cardiovascular Exercise Tolerance: Poor hypertension, On Medications + CAD, + Peripheral Vascular Disease, +CHF and + DOE  (-) Orthopnea and (-) PND Normal cardiovascular exam+ dysrhythmias + Valvular Problems/Murmurs  Rhythm:regular Rate:Normal  Clearance per Cardiology: [place] Hx of TAVR CABG.... doing well   Neuro/Psych  Headaches  Anxiety Depression     Neuromuscular disease  negative psych ROS   GI/Hepatic Neg liver ROS,GERD  Medicated,,  Endo/Other  negative endocrine ROSdiabetes, Well Controlled, Type 2    Renal/GU negative Renal ROS  negative genitourinary   Musculoskeletal   Abdominal   Peds  Hematology  (+) Blood dyscrasia, anemia   Anesthesia Other Findings Past Medical History: No date: (HFpEF) heart failure with preserved ejection fraction (HCC) No date: Anemia No date: Anxiety No date: Aortic atherosclerosis 01/21/2013: Aortic stenosis     Comment:  a.) s/p TAVR (done concurrently with CABG) 09/19/2021 -               19 mm Edwards Resilia Inspiris bioprosthetic valve No date: Arthritis No date: Basal cell carcinoma of skin No date: 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 No date: Cerebral microvascular disease No date: Coronary artery disease     Comment:  a.) 1v CABG 09/19/2021: LIMA-LAD No date: DDD (degenerative disc disease), cervical     Comment:  a.) s/p ACDF C3-C4 11/15/2020 No date: DDD (degenerative disc disease),  lumbar No date: Diabetic neuropathy (HCC) No date: Diabetic retinopathy (HCC) No date: DOE (dyspnea on exertion) No date: Frequent sinus infections No date: GERD (gastroesophageal reflux disease) No date: Heart murmur No date: History of Barrett's esophagus No date: History of colon polyps No date: Hypertension No date: Insomnia     Comment:  a.) takes melatonin + trazodone  PRN No date: LBBB (left bundle branch block) No date: MCI (mild cognitive impairment) 2018: MRSA (methicillin resistant staph aureus) culture positive No date: PAF (paroxysmal atrial fibrillation) (HCC)     Comment:  a.) CHA2DS2-VASc = 6 (age, sex, HFpEF, HTN, vascuar               disease, T2DM) as of 03/24/2024; b.) rate/rhythm               maintained on oral metoprolol  tartrate; chronically               anticoagulated using apixaban  No date: Palpitations No date: Pneumonia No date: Pulmonary hypertension (HCC) No date: Seasonal allergies No date: Squamous cell skin cancer No date: Status post bilateral cataract extraction No date: T2DM (type 2 diabetes mellitus) (HCC) No date: Tension headache Past Surgical History: 11/15/2020: ANTERIOR CERVICAL DECOMP/DISCECTOMY FUSION; N/A     Comment:  Procedure: ANTERIOR CERVICAL DECOMPRESSION/DISCECTOMY               FUSION 1 LEVEL C3/4;  Surgeon: Bluford Standing, MD;                Location: [facility] ORS;  Service: Neurosurgery;  Laterality:  N/A; 09/19/2021: AORTIC VALVE REPLACEMENT; N/A     Comment:  Procedure: AORTIC VALVE REPLACEMENT Using 19mm Edwards               Resilia Inspiris Valve;  Surgeon: Lucas Dorise POUR, MD;                Location: [facility] OR;  Service: Open Heart Surgery;                Laterality: N/A; No date: AUGMENTATION MAMMAPLASTY; Bilateral     Comment:  breast implants 1980s 12/18/2022: BIOPSY     Comment:  Procedure: BIOPSY;  Surgeon: Therisa Bi, MD;  Location:              [facility] ENDOSCOPY;  Service: Gastroenterology;; 2007: Bone  Spur     Comment:  foot 10/30/2022: BREAST IMPLANT REMOVAL; Bilateral     Comment:  Procedure: Bilateral removal of breast implants with               mastopexy;  Surgeon: Lowery Estefana RAMAN, DO;  Location:              [facility] OR;  Service: Plastics;  Laterality: Bilateral; 2008: Carpal Tunnel Symdrome     Comment:  as repeated in 2009 01/16/2019: CATARACT EXTRACTION W/PHACO; Left     Comment:  Procedure: CATARACT EXTRACTION PHACO AND INTRAOCULAR               LENS PLACEMENT (IOC) LEFT DIABETES VISION BLUE;  Surgeon:              Ferol Rogue, MD;  Location: [facility] ORS;  Service:               Ophthalmology;  Laterality: Left;  US   00:47 CDE               6.62 Fluid pack lot # [id number] 02/06/2019: CATARACT EXTRACTION W/PHACO; Right     Comment:  Procedure: CATARACT EXTRACTION PHACO AND INTRAOCULAR               LENS PLACEMENT (IOC);  Surgeon: Ferol Rogue, MD;                Location: [facility] ORS;  Service: Ophthalmology;  Laterality:              Right;  US  00:54.6 CDE 6.52 FLUID PACK LOT # [id number] 2007: CHOLECYSTECTOMY 11/17/2015: COLONOSCOPY WITH PROPOFOL ; N/A     Comment:  Procedure: COLONOSCOPY WITH PROPOFOL ;  Surgeon: Lamar ONEIDA Holmes, MD;  Location: [facility] ENDOSCOPY;  Service:               Endoscopy;  Laterality: N/A; 06/21/2022: COLONOSCOPY WITH PROPOFOL ; N/A     Comment:  Procedure: COLONOSCOPY WITH PROPOFOL ;  Surgeon: Therisa Bi, MD;  Location: [facility] ENDOSCOPY;  Service:               Gastroenterology;  Laterality: N/A; 09/19/2021: CORONARY ARTERY BYPASS GRAFT; N/A     Comment:  Procedure: CORONARY ARTERY BYPASS GRAFTING X1 Using Left              Internal Mammory artery;  Surgeon: Lucas Dorise POUR, MD;                Location: [facility];  Service: Open Heart Surgery;  Laterality: N/A; 05/24/2021: CORONARY PRESSURE/FFR STUDY; N/A     Comment:  Procedure: INTRAVASCULAR PRESSURE WIRE/FFR STUDY;                Surgeon: Florencio Cara BIRCH,  MD;  Location: [facility] INVASIVE              CV LAB;  Service: Cardiovascular;  Laterality: N/A; 11/17/2015: ESOPHAGOGASTRODUODENOSCOPY (EGD) WITH PROPOFOL ; N/A     Comment:  Procedure: ESOPHAGOGASTRODUODENOSCOPY (EGD) WITH               PROPOFOL ;  Surgeon: Lamar ONEIDA Holmes, MD;  Location: [facility]              ENDOSCOPY;  Service: Endoscopy;  Laterality: N/A; 12/18/2022: ESOPHAGOGASTRODUODENOSCOPY (EGD) WITH PROPOFOL ; N/A     Comment:  Procedure: ESOPHAGOGASTRODUODENOSCOPY (EGD) WITH               PROPOFOL ;  Surgeon: Therisa Bi, MD;  Location: [facility]               ENDOSCOPY;  Service: Gastroenterology;  Laterality: N/A; No date: MANDIBLE SURGERY 10/30/2022: MASTOPEXY; Bilateral     Comment:  Procedure: MASTOPEXY;  Surgeon: Lowery Estefana RAMAN,               DO;  Location: [facility] OR;  Service: Plastics;  Laterality:               Bilateral; 03/2013: NASAL SINUS SURGERY     Comment:  Multile surgery Dr. Gretta. DX eosinophilic sinusitis               04-2013 10/2010: Release of Trigger Finger; Right     Comment:  Dr. Claudene 05/24/2021: RIGHT/LEFT HEART CATH AND CORONARY ANGIOGRAPHY; N/A     Comment:  Procedure: RIGHT/LEFT HEART CATH AND CORONARY               ANGIOGRAPHY;  Surgeon: Florencio Cara BIRCH, MD;  Location:              [facility] INVASIVE CV LAB;  Service: Cardiovascular;                Laterality: N/A; 09/19/2021: TEE WITHOUT CARDIOVERSION; N/A     Comment:  Procedure: TRANSESOPHAGEAL ECHOCARDIOGRAM (TEE);                Surgeon: Lucas Dorise POUR, MD;  Location: [facility] OR;  Service:              Open Heart Surgery;  Laterality: N/A; 1960: TONSILLECTOMY BMI    Body Mass Index: 28.47 kg/m     Reproductive/Obstetrics negative OB ROS                              Anesthesia Physical Anesthesia Plan  ASA: 3  Anesthesia Plan: General ETT   Post-op Pain Management: Regional block*   Induction:   PONV Risk Score and Plan: 4 or greater  Airway Management Planned:    Additional Equipment:   Intra-op Plan:   Post-operative Plan:   Informed Consent: I have reviewed the patients History and Physical, chart, labs and discussed the procedure including the risks, benefits and alternatives for the proposed anesthesia with the patient or authorized representative who has indicated his/her understanding and acceptance.     Dental Advisory Given  Plan Discussed with: CRNA  Anesthesia Plan Comments:         Anesthesia Quick Evaluation

## 2024-03-25 NOTE — Evaluation (Signed)
 Physical Therapy Evaluation Patient Details Name: Katie Buckley MRN: [id number] DOB: [birth year] Today's Date: 03/25/2024  History of Present Illness  Pt is a 73 yo  female s/p L RSA on 03/25/24. PMH of CAD, aortic valve replacement with tissue valve 09/19/21, as well as bioprosthetic aortic valve replacement and CABG 09/19/2021, PVD, DM, HTN, afib, CHF, anxiety, depression, GERD.  Clinical Impression  Pt is POD 0 after L RSA. Prior to sx pt independent with amb without AD and ADLs. Pt requires CGA with STS transfer for recliner and amb using SPC d/t mild instability/dec balance. Pt educated on use of cane upon discharge and verbalized understanding. Pt demonstrates dec balance. Would benefit from skilled PT to address above deficits and promote optimal return to PLOF.       If plan is discharge home, recommend the following: A little help with walking and/or transfers   Can travel by private vehicle        Equipment Recommendations None recommended by PT  Recommendations for Other Services       Functional Status Assessment Patient has had a recent decline in their functional status and demonstrates the ability to make significant improvements in function in a reasonable and predictable amount of time.     Precautions / Restrictions Precautions Precautions: Fall;Shoulder Shoulder Interventions: Shoulder sling/immobilizer;Shoulder abduction pillow;At all times;Off for dressing/bathing/exercises Precaution Booklet Issued: No (OT provided) Recall of Precautions/Restrictions: Intact Restrictions Weight Bearing Restrictions Per Provider Order: Yes LUE Weight Bearing Per Provider Order: Non weight bearing      Mobility  Bed Mobility               General bed mobility comments: NT, in recliner    Transfers Overall transfer level: Needs assistance Equipment used: Straight cane Transfers: Sit to/from Stand Sit to Stand: Contact guard assist           General  transfer comment: Pt able to stand from chair with CGA for safety. Steady with cane    Ambulation/Gait Ambulation/Gait assistance: Contact guard assist Gait Distance (Feet): 80 Feet Assistive device: Straight cane Gait Pattern/deviations: WFL(Within Functional Limits)       General Gait Details: Narrow  BOS at times and a little unsteady with amb. Pt encouraged to use SPC upon return home.  Stairs Stairs: Yes Stairs assistance: Contact guard assist Stair Management: One rail Right, Alternating pattern Number of Stairs: 4 General stair comments: Safe with stairs. Verbal cues to use rail. CGA for safety  Wheelchair Mobility     Tilt Bed    Modified Rankin (Stroke Patients Only)       Balance Overall balance assessment: Needs assistance Sitting-balance support: No upper extremity supported, Feet supported Sitting balance-Leahy Scale: Fair Sitting balance - Comments: able to maintain seated balance without LOB   Standing balance support: Single extremity supported Standing balance-Leahy Scale: Fair Standing balance comment: Pt able to maintain standing balance using SPC with heavy reliance on cane for support                             Pertinent Vitals/Pain Pain Assessment Pain Assessment: No/denies pain    Home Living Family/patient expects to be discharged to:: Private residence Living Arrangements: Spouse/significant other Available Help at Discharge: Family;Available 24 hours/day Type of Home: Apartment Home Access: Stairs to enter Entrance Stairs-Rails: Right;Left Entrance Stairs-Number of Steps: 3   Home Layout: One level Home Equipment: Shower seat;Cane - single Librarian, academic (2 wheels)  Prior Function Prior Level of Function : Driving             Mobility Comments: indep ADLs Comments: just the one in March leading to the humeral fracture; indep ADL adn IADL     Extremity/Trunk Assessment   Upper Extremity  Assessment Upper Extremity Assessment: Defer to OT evaluation LUE Deficits / Details: Impaired sensation 2/2 block, pins and needles in hand and able to move fingers some LUE: Unable to fully assess due to immobilization;Shoulder pain at rest LUE Sensation: decreased light touch;decreased proprioception LUE Coordination: decreased fine motor;decreased gross motor    Lower Extremity Assessment Lower Extremity Assessment: Overall WFL for tasks assessed    Cervical / Trunk Assessment Cervical / Trunk Assessment: Normal  Communication   Communication Communication: No apparent difficulties    Cognition Arousal: Alert Behavior During Therapy: WFL for tasks assessed/performed   PT - Cognitive impairments: No apparent impairments                       PT - Cognition Comments: pleasant and agreeable to PT Following commands: Intact       Cueing Cueing Techniques: Verbal cues     General Comments      Exercises Other Exercises Other Exercises: Edu on use of cane upon return home and close supervision from spouse as pt amb and navigates stairs   Assessment/Plan    PT Assessment Patient needs continued PT services  PT Problem List Decreased range of motion;Decreased strength;Decreased balance;Decreased knowledge of use of DME       PT Treatment Interventions DME instruction;Gait training;Stair training;Functional mobility training;Therapeutic activities;Balance training;Therapeutic exercise;Neuromuscular re-education;Patient/family education    PT Goals (Current goals can be found in the Care Plan section)  Acute Rehab PT Goals Patient Stated Goal: I am ready to go home PT Goal Formulation: With patient Time For Goal Achievement: 04/08/24 Potential to Achieve Goals: Good    Frequency Min 1X/week     Co-evaluation               AM-PAC PT 6 Clicks Mobility  Outcome Measure Help needed turning from your back to your side while in a flat bed without  using bedrails?: A Little Help needed moving from lying on your back to sitting on the side of a flat bed without using bedrails?: A Little Help needed moving to and from a bed to a chair (including a wheelchair)?: A Little Help needed standing up from a chair using your arms (e.g., wheelchair or bedside chair)?: A Little Help needed to walk in hospital room?: A Little Help needed climbing 3-5 steps with a railing? : A Little 6 Click Score: 18    End of Session Equipment Utilized During Treatment: Gait belt Activity Tolerance: Patient tolerated treatment well Patient left: in chair;with family/visitor present Nurse Communication: Mobility status PT Visit Diagnosis: Unsteadiness on feet (R26.81);History of falling (Z91.81)    Time: 8484-8475 PT Time Calculation (min) (ACUTE ONLY): 9 min   Charges:                 Mckyla Deckman, SPT   Jouri Threat 03/25/2024, 3:52 PM

## 2024-03-25 NOTE — Progress Notes (Signed)
 Patient doing well postoperatively.  Hemovac was removed with no complication.  Discharge home when stable and ambulating with therapy.

## 2024-03-25 NOTE — Anesthesia Procedure Notes (Signed)
 Procedure Name: Intubation Date/Time: 03/25/2024 8:16 AM  Performed by: Norleen Alberta HERO., CRNAPre-anesthesia Checklist: Patient identified, Patient being monitored, Timeout performed, Emergency Drugs available and Suction available Patient Re-evaluated:Patient Re-evaluated prior to induction Oxygen Delivery Method: Circle system utilized Preoxygenation: Pre-oxygenation with 100% oxygen Induction Type: IV induction Ventilation: Mask ventilation without difficulty Laryngoscope Size: 3 and McGrath Grade View: Grade I Tube type: Oral Tube size: 6.5 mm Number of attempts: 1 Airway Equipment and Method: Stylet Placement Confirmation: ETT inserted through vocal cords under direct vision, positive ETCO2 and breath sounds checked- equal and bilateral Secured at: 19 cm Tube secured with: Tape Dental Injury: Teeth and Oropharynx as per pre-operative assessment

## 2024-03-25 NOTE — Evaluation (Signed)
 Occupational Therapy Evaluation Patient Details Name: Katie  [name] MRN: [id number] DOB: 1951-01-22 Today's Date: 03/25/2024   History of Present Illness   Pt is a 73 yo  female s/p L RSA on 03/25/24. PMH of CAD, aortic valve replacement with tissue valve 09/19/21, as well as bioprosthetic aortic valve replacement and CABG 09/19/2021, PVD, DM, HTN, afib, CHF, anxiety, depression, GERD.     Clinical Impressions Patient was seen for an OT evaluation this date. Pt lives with her spouse in an apartment with 3 STE and R/L rails. They have access to a SPC, shower chair, and 2WW at home but pt was independent with mobility, ADL, and IADL prior to this. Pt has orders for LUE to be immobilized and will be NWBing per MD. Patient presents with impaired strength/ROM, pain, and sensation to LUE. These impairments result in a decreased ability to perform self care tasks requiring MOD-MAX assist for UB/LB dressing and bathing and MAX assist for application of polar care, compression stockings, and sling/immobilizer. Pt/family instructed in polar care mgt, pet care considerations, sling/immobilizer mgt, PROM exercises, LUE precautions, adaptive strategies for bathing/dressing/toileting/grooming, positioning and considerations for sleep, and home/routines modifications to maximize falls prevention, safety, and independence. Handout provided. OT adjusted sling/immobilizer and polar care once dressed to improve comfort, optimize positioning. Pt/spouse verbalized understanding of all education/training provided. Pt will benefit from follow up therapies for rehab of her shoulder as arranged by the surgeon. No additional acute OT needs. Will sign off.       If plan is discharge home, recommend the following:   A little help with walking and/or transfers;A lot of help with bathing/dressing/bathroom;Assist for transportation;Assistance with cooking/housework;Help with stairs or ramp for entrance     Functional Status  Assessment   Patient has had a recent decline in their functional status and demonstrates the ability to make significant improvements in function in a reasonable and predictable amount of time.     Equipment Recommendations   None recommended by OT     Recommendations for Other Services         Precautions/Restrictions   Precautions Precautions: Fall;Shoulder Shoulder Interventions: Shoulder sling/immobilizer;Shoulder abduction pillow;At all times;Off for dressing/bathing/exercises Precaution Booklet Issued: Yes (comment) Recall of Precautions/Restrictions: Intact Restrictions Weight Bearing Restrictions Per Provider Order: Yes LUE Weight Bearing Per Provider Order: Non weight bearing     Mobility Bed Mobility               General bed mobility comments: NT, in recliner    Transfers Overall transfer level: Needs assistance Equipment used: None Transfers: Sit to/from Stand Sit to Stand: Contact guard assist           General transfer comment: pt endorsing feeling unsteady and not ready to ambulate to returned to sitting      Balance Overall balance assessment: Needs assistance Sitting-balance support: No upper extremity supported, Feet supported Sitting balance-Leahy Scale: Fair     Standing balance support: Single extremity supported Standing balance-Leahy Scale: Poor                             ADL either performed or assessed with clinical judgement   ADL Overall ADL's : Needs assistance/impaired                                       General ADL Comments: Pt currently  requiring MOD A for LB ADL tasks, MAX A for seated UB dressing, and MAX A for sling and polar care mgt. Spouse able to provide needed level of assist.     Vision         Perception         Praxis         Pertinent Vitals/Pain Pain Assessment Pain Assessment: 0-10 Pain Score: 3  Pain Location: L arm pit and top of shoulder Pain  Intervention(s): RN gave pain meds during session, Repositioned, Monitored during session     Extremity/Trunk Assessment Upper Extremity Assessment Upper Extremity Assessment: Right hand dominant;LUE deficits/detail LUE Deficits / Details: Impaired sensation 2/2 block, pins and needles in hand and able to move fingers some LUE: Unable to fully assess due to immobilization;Shoulder pain at rest LUE Sensation: decreased light touch;decreased proprioception LUE Coordination: decreased fine motor;decreased gross motor   Lower Extremity Assessment Lower Extremity Assessment: Generalized weakness;Defer to PT evaluation       Communication Communication Communication: No apparent difficulties   Cognition Arousal: Alert, Lethargic Behavior During Therapy: WFL for tasks assessed/performed Cognition: No apparent impairments             OT - Cognition Comments: Pt endorses feeling a bit groggy part way through evaluation, PRN VC to keep eyes open                 Following commands: Intact       Cueing  General Comments   Cueing Techniques: Verbal cues      Exercises Other Exercises Other Exercises: Pt/spouse instructed in polar care mgt, shoulder sling/immobilizer mgt, AE/DME, falls prevention, pet care considerations, adaptive strategies for bathing, dressing, and grooming, positioning for sleep and rest. Handout provided.   Shoulder Instructions      Home Living Family/patient expects to be discharged to:: Private residence Living Arrangements: Spouse/significant other Available Help at Discharge: Family;Available 24 hours/day Type of Home: Apartment Home Access: Stairs to enter Entrance Stairs-Number of Steps: 3 Entrance Stairs-Rails: Right;Left Home Layout: One level     Bathroom Shower/Tub: Chief Strategy Officer: Handicapped height     Home Equipment: Shower seat;Cane - single point;Rolling Environmental consultant (2 wheels)          Prior  Functioning/Environment Prior Level of Function : Driving             Mobility Comments: indep ADLs Comments: just the one in March leading to the humeral fracture; indep ADL adn IADL    OT Problem List: Decreased strength;Decreased coordination;Pain;Decreased range of motion;Impaired sensation;Impaired balance (sitting and/or standing);Decreased knowledge of use of DME or AE;Impaired UE functional use;Decreased knowledge of precautions   OT Treatment/Interventions:        OT Goals(Current goals can be found in the care plan section)   Acute Rehab OT Goals Patient Stated Goal: go home OT Goal Formulation: All assessment and education complete, DC therapy   OT Frequency:       Co-evaluation              AM-PAC OT 6 Clicks Daily Activity     Outcome Measure Help from another person eating meals?: A Little Help from another person taking care of personal grooming?: A Little Help from another person toileting, which includes using toliet, bedpan, or urinal?: A Little Help from another person bathing (including washing, rinsing, drying)?: A Lot Help from another person to put on and taking off regular upper body clothing?: A Lot Help from  another person to put on and taking off regular lower body clothing?: A Lot 6 Click Score: 15   End of Session Nurse Communication: Mobility status  Activity Tolerance: Patient tolerated treatment well Patient left: in chair;with call bell/phone within reach;with family/visitor present  OT Visit Diagnosis: Unsteadiness on feet (R26.81);Pain Pain - Right/Left: Left Pain - part of body: Shoulder                Time: 1350-1435 OT Time Calculation (min): 45 min Charges:  OT General Charges $OT Visit: 1 Visit OT Evaluation $OT Eval Low Complexity: 1 Low OT Treatments $Self Care/Home Management : 23-37 mins  Warren SAUNDERS., MPH, MS, OTR/L ascom [phone] 03/25/24, 3:11 PM

## 2024-03-25 NOTE — H&P (Signed)
 Paper H&P to be scanned into permanent record. H&P reviewed. No significant changes noted.

## 2024-03-25 NOTE — Discharge Instructions (Addendum)
 Katie Buckley Blanch, MD  [facility]  Phone: [phone]  Fax: [phone]   Discharge Instructions after Reverse Shoulder Replacement    1. Activity/Sling: You are to be non-weight bearing on operative extremity. A sling/shoulder immobilizer has been provided for you. Only remove the sling to perform elbow, wrist, and hand RoM exercises and hygiene/dressing. Active reaching and lifting are not permitted. You will be given further instructions on sling use at your first physical therapy visit and postoperative visit with Dr. Blanch.   2. Dressings: Dressing may be removed at 1st physical therapy visit (~3-4 days after surgery). Afterwards, you may either leave open to air (if no drainage) or cover with dry, sterile dressing. If you have steri-strips on your wound, please do not remove them. They will fall off on their own. You may shower 5 days after surgery. Please pat incision dry. Do not rub or place any shear forces across incision. If there is drainage or any opening of incision after 5 days, please notify our offices immediately.    3. Driving:  Plan on not driving for six weeks. Please note that you are advised NOT to drive while taking narcotic pain medications as you may be impaired and unsafe to drive.   4. Medications:  - You have been provided a prescription for narcotic pain medicine (usually oxycodone ). After surgery, take 1-2 narcotic tablets every 4 hours if needed for severe pain. Please start this as soon as you begin to start having pain (if you received a nerve block, start taking as soon as this wears off).  - A prescription for anti-nausea medication will be provided in case the narcotic medicine causes nausea - take 1 tablet every 6 hours only if nauseated.  - Resume Eliquis  the day after surgery -Take tylenol  1000mg  (2 Extra strength or 3 regular strength tablets) every 8 hours for pain. This will reduce the amount of narcotic medication needed. May stop tylenol  when you  are having minimal pain. - Take a stool softener (Colace, Dulcolax or Senakot) if you are using narcotic pain medications to help with constipation that is associated with narcotic use. - DO NOT take ANY nonsteroidal anti-inflammatory pain medications: Advil, Motrin, Ibuprofen, Aleve, Naproxen, or Naprosyn.   If you are taking prescription medication for anxiety, depression, insomnia, muscle spasm, chronic pain, or for attention deficit disorder you are advised that you are at a higher risk of adverse effects with use of narcotics post-op, including narcotic addiction/dependence, depressed breathing, death. If you use non-prescribed substances: alcohol, marijuana, cocaine, heroin, methamphetamines, etc., you are at a higher risk of adverse effects with use of narcotics post-op, including narcotic addiction/dependence, depressed breathing, death. You are advised that taking > 50 morphine  milligram equivalents (MME) of narcotic pain medication per day results in twice the risk of overdose or death. For your prescription provided: oxycodone  5 mg - taking more than 6 tablets per day after the first few days of surgery.   5. Physical Therapy: 1-2 times per week for ~12 weeks. Therapy typically starts on post operative Day 3 or 4. You have been provided an order for physical therapy. The therapist will provide home exercises. Please contact our offices if this appointment has not been scheduled.    6. Work: May do light duty/desk job in approximately 2 weeks when off of narcotics, pain is well-controlled, and swelling has decreased if able to function with one arm in sling. Full work may take 6 weeks if light motions and function of  both arms is required. Lifting jobs may require 12 weeks.   7. Post-Op Appointments: Your first post-op appointment will be with Dr. [name] in approximately 2 weeks time.    If you find that they have not been scheduled please call the Orthopaedic Appointment front desk at  [phone].                               Katie Buckley [name], MD [facility] Phone: [phone] Fax: [phone]   REVERSE SHOULDER ARTHROPLASTY REHAB GUIDELINES   These guidelines should be tailored to individual patients based on their rehab goals, age, precautions, quality of repair, etc.  Progression should be based on patient progress and approval by the referring physician.  PHASE 1 - Day 1 through Week 2  GENERAL GUIDELINES AND PRECAUTIONS Sling wear 24/7 except during grooming and home exercises (3 to 5 times daily) Avoid shoulder extension such that the arm is posterior the frontal plane.  When patients recline, a pillow should be placed behind the upper arm and sling should be on.  They should be advised to always be able to see the elbow Avoid combined IR/ADD/EXT, such as hand behind back to prevent dislocation Avoid combined IR and ADD such as reaching across the chest to prevent dislocation No AROM No submersion in pool/water  for 4 weeks No weight bearing through operative arm (as in transfers, walker use, etc.)  GOALS Maintain integrity of joint replacement; protect soft tissue healing Increase PROM for elevation to 120 and ER to 30 (will remain the goal for first 6 weeks) Optimize distal UE circulation and muscle activity (elbow, wrist and hand) Instruct in use of sling for proper fit, polar care device for ice application after HEP, signs/symptoms of infection  EXERCISES Active elbow, wrist and hand Passive forward elevation in scapular plane to 90-120 max motion; ER in scapular plane to 30 Active scapular retraction with arms resting in neutral position  CRITERIA TO PROGRESS TO PHASE 2 Low pain (less than 3/10) with shoulder PROM Healing of incision without signs of infection Clearance by MD to advance after 2 week MD check up  PHASE 2 -  2 weeks - 6 weeks  GENERAL GUIDELINES AND PRECAUTIONS Sling may be removed while at  home; worn in community without abduction pillow May use arm for light activities of daily living (such as feeding, brushing teeth, dressing.) with elbow near  the side of the body  and arm in front of the body- no active lifting of the arm May submerge in water  (tub, pool, [place], etc.) after 4 weeks Continue to avoid WBing through the operative arm Continue to avoid combined IR/EXT/ADD (hand behind the back) and IR/ADD  (reaching across chest) for dislocation precautions  GOALS  Achieve passive elevation to 120 and ER to 30  Low (less than 3/10) to no pain  Ability to fire all heads of the deltoid  EXERCISES May discontinue grip, and active elbow and wrist exercises since using the arm in ADL's  with sling removed around the home Continue passive elevation to 120 and ER to 30, both in scapular plane with arm supported on table top Add submaximal isometrics, pain free effort, for all functional heads of deltoid (anterior, posterior, middle)  Ensure that with posterior deltoid isometric the shoulder does not move into extension and the arm remains anterior the frontal plane At 4 weeks:  begin to place arm in balanced position of 90  deg elevation in supine; when patient able to hold this position with ease, may begin reverse pendulums clockwise and counterclockwise  CRITERIA TO PROGRESS TO PHASE 3 Passive forward elevation in scapular plane to 120; passive ER in scapular plane to 30 Ability to fire isometrically all heads of the deltoid muscle without pain Ability to place and hold the arm in balanced position (90 deg elevation in supine)  PHASE 3 - 6 weeks to 3 months  GENERAL GUIDELINES AND PRECAUTIONS Discontinue use of sling Avoid forcing end range motion in any direction to prevent dislocation  May advance use of the arm actively in ADL's without being restricted to arm by the side of the body, however, avoid heavy lifting and sports (forever!) May initiate functional IR behind the  back gently NO UPPER BODY ERGOMETER   GOALS Optimize PROM for elevation and ER in scapular plane with realistic expectation that max  mobility for elevation is usually around 145-160 passively; ER 40 to 50 passively; functional IR to L1 Recover AROM to approach as close to PROM available as possible; may expect 135-150 deg active elevation; 30 deg active ER; active functional IR to L1 Establish dynamic stability of the shoulder with deltoid and periscapular muscle gradual strengthening  EXERCISES Forward elevation in scapular plane active progression: supine to incline, to vertical; short to long lever arm Balanced position long lever arm AROM Active ER/IR with arm at side Scapular retraction with light band resistance Functional IR with hand slide up back - very gentle and gradual NO UPPER BODY ERGOMETER     CRITERIA TO PROGRESS TO PHASE 4  AROM equals/approaches PROM with good mechanics for elevation   No pain  Higher level demand on shoulder than ADL functions   PHASE 4 12 months and beyond  GENERAL GUIDELINES AND PRECAUTIONS No heavy lifting and no overhead sports No heavy pushing activity Gradually increase strength of deltoid and scapular stabilizers; also the rotator cuff if present with weights not to exceed 5 lbs NO UPPER BODY ERGOMETER   GOALS  Optimize functional use of the operative UE to meet the desired demands  Gradual increase in deltoid, scapular muscle, and rotator cuff strength  Pain free functional activities   EXERCISES Add light hand weights for deltoid up to and not to exceed 3 lbs for anterior and posterior with long arm lift against gravity; elbow bent to 90 deg for abduction in scapular plane Theraband progression for extension to hip with scapular depression/retraction Theraband progression for serratus anterior punches in supine; avoid wall, incline or prone pressups for serratus anterior End range stretching gently without forceful overpressure in  all planes (elevation in scapular plane, ER in scapular plane, functional IR) with stretching done for life as part of a daily routine NO UPPER BODY ERGOMETER     CRITERIA FOR DISCHARGE FROM SKILLED PHYSICAL THERAPY  Pain free AROM for shoulder elevation (expect around 135-150)  Functional strength for all ADL's, work tasks, and hobbies approved by Careers adviser  Independence with home maintenance program   NOTES: 1. With proper exercise, motion, strength, and function continue to improve even after one year. 2. The complication rate after surgery is 5 - 8%. Complications include infection, fracture, heterotopic bone formation, nerve injury, instability, rotator cuff tear, and tuberosity nonunion. Please look for clinical signs, unusual symptoms, or lack of progress with therapy and report those to Dr. [name]. Prefer more communication than less.  3. The therapy plan above only serves as a guide. Please be aware  of specific individualized patient instructions as written on the prescription or through discussions with the surgeon. 4. Please call Dr. [name] if you have any specific questions or concerns [phone]   POLAR CARE INFORMATION  [email or web address]  How to use [facility] Polar Care [facility] Therapy System?  YouTube   [email or web address]  OPERATING INSTRUCTIONS  Start the product With dry hands, connect the transformer to the electrical connection located on the top of the cooler. Next, plug the transformer into an appropriate electrical outlet. The unit will automatically start running at this point.  To stop the pump, disconnect electrical power.  Unplug to stop the product when not in use. Unplugging the Polar Care unit turns it off. Always unplug immediately after use. Never leave it plugged in while unattended. Remove pad.    FIRST ADD WATER  TO FILL LINE, THEN ICE---Replace ice when existing ice is almost melted  1 Discuss Treatment with your Licensed Health  Care Practitioner and Use Only as Prescribed 2 Apply Insulation Barrier & Cold Therapy Pad 3 Check for Moisture 4 Inspect Skin Regularly  Tips and Trouble Shooting Usage Tips 1. Use cubed or chunked ice for optimal performance. 2. It is recommended to drain the Pad between uses. To drain the pad, hold the Pad upright with the hose pointed toward the ground. Depress the black plunger and allow water  to drain out. 3. You may disconnect the Pad from the unit without removing the pad from the affected area by depressing the silver tabs on the hose coupling and gently pulling the hoses apart. The Pad and unit will seal itself and will not leak. Note: Some dripping during release is normal. 4. DO NOT RUN PUMP WITHOUT WATER ! The pump in this unit is designed to run with water . Running the unit without water  will cause permanent damage to the pump. 5. Unplug unit before removing lid.  TROUBLESHOOTING GUIDE Pump not running, Water  not flowing to the pad, Pad is not getting cold 1. Make sure the transformer is plugged into the wall outlet. 2. Confirm that the ice and water  are filled to the indicated levels. 3. Make sure there are no kinks in the pad. 4. Gently pull on the blue tube to make sure the tube/pad junction is straight. 5. Remove the pad from the treatment site and ll it while the pad is lying at; then reapply. 6. Confirm that the pad couplings are securely attached to the unit. Listen for the double clicks (Figure 1) to confirm the pad couplings are securely attached.  Leaks    Note: Some condensation on the lines, controller, and pads is unavoidable, especially in warmer climates. 1. If using a [employer] Polar Care Cold Therapy unit with a detachable Cold Therapy Pad, and a leak exists (other than condensation on the lines) disconnect the pad couplings. Make sure the silver tabs on the couplings are depressed before reconnecting the pad to the pump hose; then confirm both sides of the coupling are  properly clicked in. 2. If the coupling continues to leak or a leak is detected in the pad itself, stop using it and call [employer] Customer Care at [phone].  Cleaning After use, empty and dry the unit with a soft cloth. Warm water  and mild detergent may be used occasionally to clean the pump and tubes.  WARNING: The Polar Care Cube can be cold enough to cause serious injury, including full skin necrosis. Follow these Operating Instructions, and carefully read the Product Insert (see pouch on  side of unit) and the Cold Therapy Pad Fitting Instructions (provided with each Cold Therapy Pad) prior to use.       SHOULDER SLING IMMOBILIZER   VIDEO Slingshot 2 Shoulder Brace Application - YouTube ---[email or web address].info/  INSTRUCTIONS While supporting the injured arm, slide the forearm into the sling. Wrap the adjustable shoulder strap around the neck and shoulders and attach the strap end to the sling using  the "alligator strap tab."  Adjust the shoulder strap to the required length. Position the shoulder pad behind the neck. To secure the shoulder pad location (optional), pull the shoulder strap away from the shoulder pad, unfold the hook material on the top of the pad, then press the shoulder strap back onto the hook material to secure the pad in place. Attach the closure strap across the open top of the sling. Position the strap so that it holds the arm securely in the sling. Next, attach the thumb strap to the open end of the sling between the thumb and fingers. After sling has been fit, it may be easily removed and reapplied using the quick release buckle on shoulder strap. If a neutral pillow or 15 abduction pillow is included, place the pillow at the waistline. Attach the sling to the pillow, lining up hook material on the pillow with the loop on sling. Adjust the waist strap to fit.  If waist strap is too long, cut it to fit. Use the small piece of double sided  hook material (located on top of the pillow) to secure the strap end. Place the double sided hook material on the inside of the cut strap end and secure it to the waist strap.     If no pillow is included, attach the waist strap to the sling and adjust to fit.    Washing Instructions: Straps and sling must be removed and cleaned regularly depending on your activity level and perspiration. Hand wash straps and sling in cold water  with mild detergent, rinse, air dry

## 2024-03-25 NOTE — Op Note (Signed)
 Operative Note    SURGERY DATE: 03/25/2024   PRE-OP DIAGNOSIS:  1.  Left proximal humerus malunion   POST-OP DIAGNOSIS:  1.  Left proximal humerus malunion  PROCEDURES:  1.  Left reverse total shoulder arthroplasty (for proximal humerus malunion) 2.  Left biceps tenodesis   SURGEON: Earnestine HILARIO Blanch, MD   ASSISTANT: DOROTHA Krystal Doyne, PA  ANESTHESIA: Gen + interscalene block   ESTIMATED BLOOD LOSS: 100cc   TOTAL IV FLUIDS: See anesthesia record  IMPLANTS: Tornier: Standard 25mm baseplate with 6.5 x 30mm central screw; 5.47mm screws to baseplate x 2; 36mm standard glenosphere; Perform 1+ long humeral stem; +0 retentive poly insert   INDICATION(S): Katie Buckley is a 73 y.o. female who sustained a proximal humerus fracture 7 months ago.  She has had persistent pain and dysfunction with limited range of motion since that time..  Conservative measures including medications and physical therapy have not provided adequate relief.  Preoperative imaging was consistent with proximal humerus malunion with significant posteromedial varus displacement of the humeral head.  After discussion of risks, benefits, and alternatives to surgery, the patient elected to proceed with reverse shoulder arthroplasty and biceps tenodesis.   OPERATIVE FINDINGS: Complete tears of the supraspinatus and infraspinatus as well as subscapularis with significant retraction; significant biceps tendinopathy   OPERATIVE REPORT:   I identified Katie Buckley in the pre-operative holding area. Informed consent was obtained and the surgical site was marked. I reviewed the risks and benefits of the proposed surgical intervention and the patient (and/or patient's guardian) wished to proceed. An interscalene block was administered by the Anesthesia team. The patient was transferred to the operative suite and general anesthesia was administered. The patient was placed in the beach chair position with the head of the bed elevated  approximately 45 degrees. All down side pressure points were appropriately padded. Pre-op exam under anesthesia confirmed some stiffness and crepitus. Appropriate IV antibiotics were administered. Tranexamic acid  was also administered. The extremity was then prepped and draped in standard fashion. A time out was performed confirming the correct extremity, correct patient, and correct procedure.   We used the standard deltopectoral incision from the coracoid to ~12cm distal. We found the cephalic vein and took it laterally.  We opened the deltopectoral interval widely and placed retractors under the CA ligament in the subacromial space and under the deltoid tendon at its insertion. We then abducted and internally rotated the arm and released the underlying bursa between these retractors, taking care not to damage the circumflex branch of the axillary nerve.   Next, we brought the arm back in adduction at slight forward flexion with external rotation. We opened the clavipectoral fascia lateral to the conjoint tendon. We gently palpated the axillary nerve and verified its position and continuity on both sides of the humerus with a Tug test. Note, this test was repeated multiple times during the procedure for nerve localization and confirmed to be intact at the end of the case. We then cauterized the anterior humeral circumflex ("Three sisters") vessels. The arm was then internally rotated, we cut the falciform ligament at approximately 1 cm of the upper portion of the pectoralis major insertion. Next we unroofed the bicipital groove. We proceeded with a soft tissue biceps tenodesis given the pathology of the tendon.  The proximal biceps appeared to have been ruptured and then scarred in at the superior aspect of the bicipital groove.  Next, we performed a biceps tenodesis with two #2 TiCron sutures to  the upper border of the pectoralis major. The proximal portion of the tendon was excised.   The proximal humerus  had healed in a position of significant posteromedial varus.  The lesser tuberosity was identified, and osteotomy was performed.  Next, the articular portion of the humeral head was removed with an osteotome.  The infraspinatus attachment on the greater tuberosity was identified and care was taken to preserve this while removing the articular portion of the humeral head. #5 Ethibond sutures were placed around the greater tuberosity at the bone-tendon junction.  Additionally, two X-Braid tape suture were placed around the greater tuberosity (one to serve as tuberosity to impant suture and one to serve as an around the world suture)  Next, A posterior retractor was used to retract greater tuberosity, exposing the glenoid.  The anterior capsule was separated from the subscapularis and excised from the lesser tuberosity to the glenoid.  The labrum and the remnant of the biceps tendon were removed in a circumferential fashion.  The attachment of the triceps muscle was released off of the inferior glenoid.  During the glenoid exposure, the axillary nerve was protected the entire time.  Appropriate retractors were placed and there was excellent glenoid visualization.  The central guidepin was drilled with a 10 degree inferior tilt, while maintaining native retroversion, in accordance with our preoperative plan. The glenoid was reamed in order to remove the cartilaginous surface without taking away too much bone.  Standard glenoid instrumentation was performed.  The baseplate was placed and achieved excellent press-fit such that on attempted further tightening, the entire scapula rotated.  The peripheral screws were drilled, measured, and placed.  Peripheral reamer was used to clear out any debris and allow for appropriate glenosphere placement.  Glenosphere was impacted and found to be firmly seated.  Glenosphere screw was tightened appropriately.   We then turned our attention to the humerus.  The humeral canal was  identified.  Broaching was performed at ~20 degrees of retroversion.  Trial implants were placed.  The joint was reduced and noted to have satisfactory stability, motion, and deltoid tension with adequate tuberosity reduction.  We drilled two holes into the shaft on either side of the bicipital groove ~2cm inferior to the humeral fracture line and passed two Xbraid tapes through both holes to serve as vertical tuberosity fixation sutures.  The previously passed Xbraid sutures in the greater tuberosity were passed around the implant.  The stem was then inserted into the appropriate position.  Excellent press-fit was achieved.  Poly insert was placed.  Next, #5 Ethibond sutures from the greater tuberosity were passed around the lesser tuberosity to serve as horizontal cerclage sutures.  One of  the Xbraid tapes passed around the medial implant was then passed around the lesser tuberosity as well to serve as an around the world suture (greater tuberosity to stem to the lesser tuberosity).  Both vertical sutures were passed through the greater and lesser tuberosities.  Bone graft was placed between the tuberosities and the implant to help stimulate bone healing.  The greater tuberosity to implant XBraid suture was tied first, securing the greater tuberosity to the implant.  Next, the horizontal cerclage Ethibond sutures were tied, and this was followed by the around the world XBraid suture. Finally, vertical sutures were tied. This achieved excellent stability of the tuberosities and they were stable to external and internal rotation.    Final confirmation of motion, tension, and stability were satisfactory. The wound was thoroughly irrigated.  Irrisept  was then used to thoroughly irrigate the wound and was left in place for 3 minutes.  Vancomycin  powder was placed.  A Hemovac drain was placed.    The deltopectoral interval was closed with a running, 0-Vicryl suture. The skin was closed with 2-0 Vicryl and  staples. Xeroform and Honeycomb dressing were applied. A PolarCare unit and sling were placed. Patient was extubated, transferred to a stretcher bed and to the post anesthesia care unit in stable condition.    Of note, assistance from a PA was essential to performing the surgery.  PA was present for the entire surgery.  PA assisted with patient positioning, retraction, instrumentation, and wound closure. The surgery would have been more difficult and had longer operative time without PA assistance.   Additionally, this case had significantly increased complexity and surgical time compared to standard reverse shoulder arthroplasty.  This was due to this being a proximal humerus malunion.  This caused significantly distorted anatomy which required more careful and meticulous dissection, adding to surgical time.  Additionally, lesser and greater tuberosity repair required multiple sutures to be placed and tied.  Both of these factors added approximately 45 minutes to the surgical time compared to that for a standard reverse shoulder arthroplasty.   POSTOPERATIVE PLAN: The patient will be discharged home, if appropriate, from PACU. Operative arm to remain in sling at all times except RoM exercises and hygiene. Can perform pendulums, elbow/wrist/hand RoM exercises. Passive RoM allowed to 90 FF and 30 ER.  Resume baseline Eliquis  on postoperative day #1. Plan for outpatient PT starting on POD #3-4. Patient to return to clinic in ~2 weeks for post-operative appointment.

## 2024-03-25 NOTE — Transfer of Care (Signed)
 Immediate Anesthesia Transfer of Care Note  Patient: Katie  J Buckley  Procedure(s) Performed: ARTHROPLASTY, SHOULDER, TOTAL, REVERSE (Left: Shoulder) TENODESIS, BICEPS (Left: Shoulder)  Patient Location: PACU  Anesthesia Type:General  Level of Consciousness: awake, alert , and oriented  Airway & Oxygen Therapy: Patient Spontanous Breathing and Patient connected to face mask oxygen  Post-op Assessment: Report given to RN and Post -op Vital signs reviewed and stable  Post vital signs: stable  Last Vitals:  Vitals Value Taken Time  BP 125/57 03/25/24 11:03  Temp    Pulse 58 03/25/24 11:04  Resp 13 03/25/24 11:04  SpO2 100 % 03/25/24 11:04  Vitals shown include unfiled device data.  Last Pain:  Vitals:   03/25/24 0631  TempSrc: Temporal  PainSc: 2       Patients Stated Pain Goal: 2 (03/25/24 0631)  Complications: There were no known notable events for this encounter.

## 2024-03-25 NOTE — Anesthesia Procedure Notes (Signed)
 Anesthesia Regional Block: Interscalene brachial plexus block   Pre-Anesthetic Checklist: , timeout performed,  Correct Patient, Correct Site, Correct Laterality,  Correct Procedure, Correct Position, site marked,  Risks and benefits discussed,  Surgical consent,  Pre-op evaluation,  At surgeon's request and post-op pain management  Laterality: Upper and Left  Prep: chloraprep       Needles:  Injection technique: Single-shot  Needle Type: Echogenic Stimulator Needle     Needle Length: 10cm  Needle Gauge: 20     Additional Needles:   Procedures:,,,, ultrasound used (permanent image in chart),,    Narrative:  End time: 03/25/2024 7:41 AM  Performed by: Personally  Anesthesiologist: Myra Lynwood MATSU, MD  Additional Notes: Pt. Identified and accepting of procedure after risks and benefits fully reviewed and questions answered. Time out performed and laterality confirmed prior to procedure.  ISNB  performed without difficulty and well tolerated.  Neg IV and SATD.  No pain on injection of Local anesthetic and VSST.

## 2024-03-26 ENCOUNTER — Encounter: Payer: Self-pay | Admitting: Orthopedic Surgery

## 2024-03-26 NOTE — Anesthesia Postprocedure Evaluation (Signed)
 Anesthesia Post Note  Patient: Katie Buckley  Procedure(s) Performed: ARTHROPLASTY, SHOULDER, TOTAL, REVERSE (Left: Shoulder) TENODESIS, BICEPS (Left: Shoulder)  Patient location during evaluation: PACU Anesthesia Type: General Level of consciousness: awake and alert Pain management: pain level controlled Vital Signs Assessment: post-procedure vital signs reviewed and stable Respiratory status: spontaneous breathing, nonlabored ventilation, respiratory function stable and patient connected to nasal cannula oxygen Cardiovascular status: blood pressure returned to baseline and stable Postop Assessment: no apparent nausea or vomiting Anesthetic complications: no   There were no known notable events for this encounter.   Last Vitals:  Vitals:   03/25/24 1438 03/25/24 1525  BP: (!) 113/50 (!) 141/55  Pulse: (!) 53 64  Resp: 14 15  Temp: 36.9 C (!) 36.4 C  SpO2: 93% 95%    Last Pain:  Vitals:   03/25/24 1525  TempSrc:   PainSc: 2                  Lynwood KANDICE Clause

## 2024-03-28 ENCOUNTER — Other Ambulatory Visit: Payer: Self-pay | Admitting: Family

## 2024-03-28 DIAGNOSIS — E1140 Type 2 diabetes mellitus with diabetic neuropathy, unspecified: Secondary | ICD-10-CM

## 2024-03-28 DIAGNOSIS — M25522 Pain in left elbow: Secondary | ICD-10-CM | POA: Diagnosis not present

## 2024-03-28 DIAGNOSIS — M25622 Stiffness of left elbow, not elsewhere classified: Secondary | ICD-10-CM | POA: Diagnosis not present

## 2024-03-28 DIAGNOSIS — S42292D Other displaced fracture of upper end of left humerus, subsequent encounter for fracture with routine healing: Secondary | ICD-10-CM | POA: Diagnosis not present

## 2024-03-28 DIAGNOSIS — R29898 Other symptoms and signs involving the musculoskeletal system: Secondary | ICD-10-CM | POA: Diagnosis not present

## 2024-03-29 ENCOUNTER — Other Ambulatory Visit: Payer: Self-pay | Admitting: Family

## 2024-03-29 DIAGNOSIS — E1165 Type 2 diabetes mellitus with hyperglycemia: Secondary | ICD-10-CM

## 2024-03-29 DIAGNOSIS — I10 Essential (primary) hypertension: Secondary | ICD-10-CM

## 2024-04-02 DIAGNOSIS — F331 Major depressive disorder, recurrent, moderate: Secondary | ICD-10-CM | POA: Diagnosis not present

## 2024-04-02 DIAGNOSIS — R41840 Attention and concentration deficit: Secondary | ICD-10-CM | POA: Diagnosis not present

## 2024-04-02 DIAGNOSIS — F411 Generalized anxiety disorder: Secondary | ICD-10-CM | POA: Diagnosis not present

## 2024-04-02 DIAGNOSIS — G4700 Insomnia, unspecified: Secondary | ICD-10-CM | POA: Diagnosis not present

## 2024-04-04 ENCOUNTER — Encounter

## 2024-04-07 ENCOUNTER — Ambulatory Visit: Attending: Neurology | Admitting: Speech Pathology

## 2024-04-09 DIAGNOSIS — M25522 Pain in left elbow: Secondary | ICD-10-CM | POA: Diagnosis not present

## 2024-04-10 ENCOUNTER — Ambulatory Visit: Admitting: Speech Pathology

## 2024-04-15 ENCOUNTER — Ambulatory Visit: Admitting: Speech Pathology

## 2024-04-16 DIAGNOSIS — M25522 Pain in left elbow: Secondary | ICD-10-CM | POA: Diagnosis not present

## 2024-04-17 ENCOUNTER — Ambulatory Visit: Admitting: Speech Pathology

## 2024-04-22 ENCOUNTER — Ambulatory Visit: Admitting: Speech Pathology

## 2024-04-23 DIAGNOSIS — M25522 Pain in left elbow: Secondary | ICD-10-CM | POA: Diagnosis not present

## 2024-04-24 ENCOUNTER — Ambulatory Visit: Admitting: Speech Pathology

## 2024-04-24 ENCOUNTER — Other Ambulatory Visit: Payer: Self-pay | Admitting: Family

## 2024-04-24 DIAGNOSIS — J0101 Acute recurrent maxillary sinusitis: Secondary | ICD-10-CM

## 2024-04-24 DIAGNOSIS — G44229 Chronic tension-type headache, not intractable: Secondary | ICD-10-CM

## 2024-04-24 DIAGNOSIS — J0100 Acute maxillary sinusitis, unspecified: Secondary | ICD-10-CM

## 2024-04-24 DIAGNOSIS — K219 Gastro-esophageal reflux disease without esophagitis: Secondary | ICD-10-CM

## 2024-04-29 ENCOUNTER — Ambulatory Visit: Admitting: Speech Pathology

## 2024-05-01 ENCOUNTER — Ambulatory Visit: Admitting: Speech Pathology

## 2024-05-01 DIAGNOSIS — M25522 Pain in left elbow: Secondary | ICD-10-CM | POA: Diagnosis not present

## 2024-05-06 ENCOUNTER — Ambulatory Visit: Admitting: Speech Pathology

## 2024-05-07 DIAGNOSIS — M25522 Pain in left elbow: Secondary | ICD-10-CM | POA: Diagnosis not present

## 2024-05-13 ENCOUNTER — Ambulatory Visit: Admitting: Speech Pathology

## 2024-05-14 DIAGNOSIS — Z96612 Presence of left artificial shoulder joint: Secondary | ICD-10-CM | POA: Diagnosis not present

## 2024-05-14 DIAGNOSIS — M25522 Pain in left elbow: Secondary | ICD-10-CM | POA: Diagnosis not present

## 2024-05-15 ENCOUNTER — Ambulatory Visit: Admitting: Speech Pathology

## 2024-05-15 DIAGNOSIS — C44311 Basal cell carcinoma of skin of nose: Secondary | ICD-10-CM | POA: Diagnosis not present

## 2024-05-16 ENCOUNTER — Telehealth: Payer: Self-pay | Admitting: Family

## 2024-05-16 NOTE — Telephone Encounter (Signed)
 Lvm to schedule pt needs to be seen for follow up 2 weeks after 10/7 needs to be seen by end of Year.

## 2024-05-17 ENCOUNTER — Encounter: Payer: Self-pay | Admitting: Family

## 2024-05-20 ENCOUNTER — Encounter: Admitting: Speech Pathology

## 2024-05-20 DIAGNOSIS — M25522 Pain in left elbow: Secondary | ICD-10-CM | POA: Diagnosis not present

## 2024-05-22 ENCOUNTER — Encounter: Admitting: Speech Pathology

## 2024-05-23 ENCOUNTER — Encounter: Payer: Self-pay | Admitting: Family

## 2024-05-23 ENCOUNTER — Ambulatory Visit: Admitting: Family

## 2024-05-23 VITALS — BP 140/72 | HR 71 | Temp 98.0°F | Ht 60.0 in | Wt 144.8 lb

## 2024-05-23 DIAGNOSIS — Z794 Long term (current) use of insulin: Secondary | ICD-10-CM

## 2024-05-23 DIAGNOSIS — E1165 Type 2 diabetes mellitus with hyperglycemia: Secondary | ICD-10-CM

## 2024-05-23 DIAGNOSIS — J0100 Acute maxillary sinusitis, unspecified: Secondary | ICD-10-CM

## 2024-05-23 DIAGNOSIS — E119 Type 2 diabetes mellitus without complications: Secondary | ICD-10-CM

## 2024-05-23 MED ORDER — AMOXICILLIN-POT CLAVULANATE 875-125 MG PO TABS: 1.0000 | ORAL_TABLET | Freq: Two times a day (BID) | ORAL | 0 refills | Status: AC

## 2024-05-23 MED ORDER — FREESTYLE LIBRE 3 PLUS SENSOR MISC: 1 refills | Status: AC

## 2024-05-23 NOTE — Progress Notes (Unsigned)
 Established Patient Office Visit  Subjective:      CC:  Chief Complaint  Patient presents with   Acute Visit    Reports sinus congestion and slight shortness of breath at times, feels like this could be related to her having pneumonia.    HPI: Katie  JAYNIE Buckley is a 73 y.o. female presenting on 05/23/2024 for Acute Visit (Reports sinus congestion and slight shortness of breath at times, feels like this could be related to her having pneumonia.) .  Discussed the use of AI scribe software for clinical note transcription with the patient, who gave verbal consent to proceed.  History of Present Illness Katie  J Buckley is a 73 year old female who presents with persistent respiratory symptoms following a recent flu shot.  She has been experiencing persistent respiratory symptoms for approximately three weeks following a flu shot. Symptoms include rhinorrhea, dry cough, and occasional productive cough with clear mucus. No fever, but significant nasal congestion and frequent sneezing are present. She has been using chloraseptic for symptom relief but has not been taking any regular allergy medications like Claritin, Allegra, or Zyrtec.  She has a history of pneumonia, which she had during her last visit, and felt she had recovered from it before receiving the flu shot. Since then, she has not felt the same, experiencing symptoms she attributes to possible allergies, although she is unsure of the specific allergens involved.  She reports swelling and tenderness in the left gland, present for the last three weeks, along with some facial pressure. Her left ear has been bothering her, but she did not report ear pain.  Regarding her diabetes management, she is still on insulin  and mentions issues with her Libre 3 sensors, including low readings and sensors falling off. She has not experienced any recent wheezing and does not feel significantly fatigued, although she acknowledges some  myalgias.  In the past, she was prescribed Allegra and Singulair  for allergies but has not taken them recently.         Social history:  Relevant past medical, surgical, family and social history reviewed and updated as indicated. Interim medical history since our last visit reviewed.  Allergies and medications reviewed and updated.  DATA REVIEWED: CHART IN EPIC     ROS: Negative unless specifically indicated above in HPI.   Current Medications[1]        Objective:        BP (!) 140/72 (BP Location: Right Arm, Patient Position: Sitting, Cuff Size: Normal)   Pulse 71   Temp 98 F (36.7 C) (Temporal)   Ht 5' (1.524 m)   Wt 144 lb 12.8 oz (65.7 kg)   SpO2 98%   BMI 28.28 kg/m   Physical Exam HEENT: Fluid in left ear. Sinus tenderness present. NECK: Left cervical lymphadenopathy, tender.  Wt Readings from Last 3 Encounters:  05/23/24 144 lb 12.8 oz (65.7 kg)  03/25/24 145 lb 12.8 oz (66.1 kg)  03/18/24 145 lb 12.8 oz (66.1 kg)    Physical Exam Vitals reviewed.  Constitutional:      General: She is not in acute distress.    Appearance: Normal appearance. She is normal weight. She is not ill-appearing, toxic-appearing or diaphoretic.  HENT:     Head: Normocephalic.     Right Ear: Tympanic membrane normal.     Left Ear: Tympanic membrane normal.     Nose: Mucosal edema and congestion present.     Right Sinus: Maxillary sinus tenderness present.  Left Sinus: Maxillary sinus tenderness present.     Mouth/Throat:     Mouth: Mucous membranes are dry.     Pharynx: No oropharyngeal exudate or posterior oropharyngeal erythema.  Eyes:     Extraocular Movements: Extraocular movements intact.     Pupils: Pupils are equal, round, and reactive to light.  Cardiovascular:     Rate and Rhythm: Normal rate and regular rhythm.     Pulses: Normal pulses.     Heart sounds: Normal heart sounds.  Pulmonary:     Effort: Pulmonary effort is normal.     Breath  sounds: Normal breath sounds.  Musculoskeletal:     Cervical back: Normal range of motion.  Lymphadenopathy:     Head:     Left side of head: Tonsillar and preauricular adenopathy present.  Neurological:     General: No focal deficit present.     Mental Status: She is alert and oriented to person, place, and time. Mental status is at baseline.  Psychiatric:        Mood and Affect: Mood normal.        Behavior: Behavior normal.        Thought Content: Thought content normal.        Judgment: Judgment normal.          Results   Assessment & Plan:   Assessment and Plan Assessment & Plan Acute maxillary sinusitis Symptoms include facial pressure, tenderness, and a swollen left gland for three weeks. No fever or wheezing. Likely bacterial infection given the duration and symptoms.  Allergic rhinitis Symptoms include runny nose, sneezing, and congestion since receiving a flu shot three weeks ago. No regular allergy medication use. - Recommended over-the-counter Allegra for allergy symptoms. -rx augmentin  875/125 mg po bid x 10 days   Type 2 diabetes mellitus, controlled Diabetes is well-controlled with insulin  therapy. Issues with glucose sensors causing low readings. - Sent message to pharmacy for additional glucose sensors.    Lab Results  Component Value Date   HGBA1C 6.5 02/26/2024      Return in about 6 months (around 11/21/2024) for f/u diabetes.     Ginger Patrick, MSN, APRN, FNP-C  [facility]        [1]  Current Outpatient Medications:    acetaminophen  (TYLENOL ) 500 MG tablet, Take 2 tablets (1,000 mg total) by mouth every 8 (eight) hours., Disp: 90 tablet, Rfl: 2   amLODipine  (NORVASC ) 5 MG tablet, Take 1 tablet (5 mg total) by mouth daily. (Patient taking differently: Take 5 mg by mouth in the morning and at bedtime.), Disp: 30 tablet, Rfl: 1   amoxicillin -clavulanate (AUGMENTIN ) 875-125 MG tablet, Take 1 tablet by mouth 2  (two) times daily., Disp: 20 tablet, Rfl: 0   apixaban  (ELIQUIS ) 5 MG TABS tablet, Take 1 tablet (5 mg total) by mouth 2 (two) times daily., Disp: 60 tablet, Rfl: 2   Blood Glucose Monitoring Suppl (CONTOUR NEXT MONITOR) w/Device KIT, Use to check blood sugar daily, Disp: 1 kit, Rfl: 0   buPROPion  (WELLBUTRIN  SR) 150 MG 12 hr tablet, Take 150 mg by mouth 2 (two) times daily., Disp: , Rfl:    butalbital -acetaminophen -caffeine  (FIORICET) 50-325-40 MG tablet, TAKE 1 TABLET BY MOUTH EVERY 6 HOURS AS NEEDED FOR HEADACHE, Disp: 30 tablet, Rfl: 4   cholecalciferol (VITAMIN D3) 25 MCG (1000 UNIT) tablet, Take 1,000 Units by mouth daily., Disp: , Rfl:    ezetimibe  (ZETIA ) 10 MG tablet, Take 1 tablet (10 mg total)  by mouth at bedtime., Disp: 90 tablet, Rfl: 3   FLUoxetine (PROZAC) 40 MG capsule, Take 40 mg by mouth daily., Disp: , Rfl:    glipiZIDE  (GLUCOTROL  XL) 2.5 MG 24 hr tablet, TAKE 1 TABLET BY MOUTH EACH MORNING X1 WEEK THEN MAY INCREASE TO 2 TABS IN THE MORNING AS INSTRUCTED, Disp: 180 tablet, Rfl: 1   hydrochlorothiazide  (HYDRODIURIL ) 12.5 MG tablet, Take 12.5 mg by mouth daily., Disp: , Rfl:    hydrOXYzine  (VISTARIL ) 25 MG capsule, Take 25 mg by mouth daily as needed for anxiety., Disp: , Rfl:    insulin  degludec (TRESIBA  FLEXTOUCH) 100 UNIT/ML FlexTouch Pen, INJECT 10 UNITS SUBCUTANEOUSLY AS DIRECTED & INCREASE ONLY AS INSTRUCTED TO MAX DOSE OF 40 UNITS, Disp: 15 mL, Rfl: 1   irbesartan  (AVAPRO ) 150 MG tablet, TAKE 1 TABLET BY MOUTH EVERY DAY, Disp: 90 tablet, Rfl: 1   Lancet Devices MISC, , Disp: , Rfl:    Melatonin 10 MG TABS, Take 10 mg by mouth at bedtime., Disp: , Rfl:    metFORMIN  (GLUCOPHAGE -XR) 500 MG 24 hr tablet, TAKE 2 TABLETS BY MOUTH EVERY DAY, Disp: 180 tablet, Rfl: 3   metoprolol  tartrate (LOPRESSOR ) 50 MG tablet, Take 1 tablet (50 mg total) by mouth 2 (two) times daily., Disp: 60 tablet, Rfl: 2   ondansetron  (ZOFRAN -ODT) 4 MG disintegrating tablet, Take 1 tablet (4 mg total) by  mouth every 8 (eight) hours as needed for nausea or vomiting., Disp: 20 tablet, Rfl: 0   ONETOUCH ULTRA test strip, USE TO CHECK BLOOD SUGAR ONCE DAILY, Disp: 100 strip, Rfl: 3   oxyCODONE  (ROXICODONE ) 5 MG immediate release tablet, Take 1-2 tablets (5-10 mg total) by mouth every 4 (four) hours as needed (pain)., Disp: 30 tablet, Rfl: 0   pregabalin  (LYRICA ) 75 MG capsule, Take 1 capsule (75 mg total) by mouth 2 (two) times daily., Disp: , Rfl:    RABEprazole  (ACIPHEX ) 20 MG tablet, Take 1 tablet (20 mg total) by mouth 2 times daily at 12 noon and 4 pm., Disp: 180 tablet, Rfl: 3   rosuvastatin  (CRESTOR ) 20 MG tablet, Take 1 tablet (20 mg total) by mouth at bedtime., Disp: 90 tablet, Rfl: 3   sucralfate  (CARAFATE ) 1 g tablet, TAKE 1 TABLET (1 G TOTAL) BY MOUTH WITH BREAKFAST, WITH LUNCH, AND WITH EVENING MEAL FOR 14 DAYS., Disp: 42 tablet, Rfl: 0   traZODone  (DESYREL ) 50 MG tablet, Take 50-100 mg by mouth at bedtime., Disp: , Rfl:    Vibegron  (GEMTESA ) 75 MG TABS, Take 1 tablet (75 mg total) by mouth daily., Disp: 90 tablet, Rfl: 3   Continuous Glucose Sensor (FREESTYLE LIBRE 3 PLUS SENSOR) MISC, Use to check blood sugar continuously. Change sensor every 15 days., Disp: 6 each, Rfl: 1

## 2024-05-27 ENCOUNTER — Encounter: Admitting: Speech Pathology

## 2024-05-29 ENCOUNTER — Encounter: Admitting: Speech Pathology

## 2024-05-30 ENCOUNTER — Encounter: Payer: Self-pay | Admitting: Family

## 2024-06-02 ENCOUNTER — Encounter: Admitting: Speech Pathology

## 2024-06-04 ENCOUNTER — Encounter: Admitting: Speech Pathology

## 2024-06-06 ENCOUNTER — Other Ambulatory Visit: Payer: Self-pay | Admitting: Family

## 2024-06-06 DIAGNOSIS — J0100 Acute maxillary sinusitis, unspecified: Secondary | ICD-10-CM

## 2024-06-09 ENCOUNTER — Encounter: Admitting: Speech Pathology

## 2024-06-11 ENCOUNTER — Encounter: Admitting: Speech Pathology

## 2024-06-16 ENCOUNTER — Encounter: Admitting: Speech Pathology

## 2024-06-17 DIAGNOSIS — Z951 Presence of aortocoronary bypass graft: Secondary | ICD-10-CM

## 2024-06-17 DIAGNOSIS — I25118 Atherosclerotic heart disease of native coronary artery with other forms of angina pectoris: Secondary | ICD-10-CM

## 2024-06-18 ENCOUNTER — Encounter: Admitting: Speech Pathology

## 2024-06-19 NOTE — Progress Notes (Signed)
 This encounter was created in error - please disregard.

## 2024-06-23 ENCOUNTER — Encounter: Admitting: Speech Pathology

## 2024-06-25 ENCOUNTER — Encounter: Admitting: Speech Pathology

## 2024-06-26 ENCOUNTER — Ambulatory Visit
Admission: RE | Admit: 2024-06-26 | Discharge: 2024-06-26 | Disposition: A | Discharge status: Home / Self Care | Source: Ambulatory Visit | Attending: Internal Medicine | Admitting: Internal Medicine

## 2024-06-26 DIAGNOSIS — Z951 Presence of aortocoronary bypass graft: Secondary | ICD-10-CM | POA: Diagnosis present

## 2024-06-26 DIAGNOSIS — I25118 Atherosclerotic heart disease of native coronary artery with other forms of angina pectoris: Secondary | ICD-10-CM | POA: Insufficient documentation

## 2024-06-26 LAB — POCT I-STAT CREATININE: Creatinine, Ser: 0.9 mg/dL (ref 0.44–1.00)

## 2024-06-26 MED ORDER — IOHEXOL 350 MG/ML SOLN
100.0000 mL | Freq: Once | INTRAVENOUS | Status: AC | PRN
  Administered 2024-06-26: 100 mL via INTRAVENOUS

## 2024-06-26 MED ORDER — METOPROLOL TARTRATE 5 MG/5ML IV SOLN: 10.0000 mg | Freq: Once | INTRAVENOUS | Status: DC | PRN

## 2024-06-26 MED ORDER — DILTIAZEM HCL 25 MG/5ML IV SOLN: 10.0000 mg | INTRAVENOUS | Status: DC | PRN

## 2024-06-26 MED ORDER — NITROGLYCERIN 0.4 MG SL SUBL
0.8000 mg | SUBLINGUAL_TABLET | Freq: Once | SUBLINGUAL | Status: AC
  Administered 2024-06-26: 0.8 mg via SUBLINGUAL

## 2024-06-26 NOTE — Progress Notes (Signed)
 Patient tolerated procedure well. W/C to lobby.  Ambulate w/o difficulty. Denies light headedness or being dizzy. Encouraged to drink extra water today and reasoning explained. Verbalized understanding. All questions answered. ABC intact. No further needs. Discharge from procedure area w/o issues.

## 2024-06-27 ENCOUNTER — Other Ambulatory Visit (HOSPITAL_COMMUNITY): Payer: Self-pay

## 2024-06-27 ENCOUNTER — Telehealth: Payer: Self-pay

## 2024-06-27 NOTE — Telephone Encounter (Signed)
 Pharmacy Patient Advocate Encounter   Received notification from [facility] that prior authorization for One Touch Ultra TS is required/requested.    Insurance verification completed.   The patient is insured through [facility] ADV[employer]/RX [employer]VANCE. Patient has a Contour Next meter from 12/28/22

## 2024-07-03 ENCOUNTER — Other Ambulatory Visit: Payer: Self-pay | Admitting: Family

## 2024-07-03 DIAGNOSIS — E782 Mixed hyperlipidemia: Secondary | ICD-10-CM

## 2024-07-09 ENCOUNTER — Encounter: Payer: Self-pay | Admitting: Student

## 2024-07-09 ENCOUNTER — Ambulatory Visit: Admitting: Student

## 2024-07-09 VITALS — BP 180/70 | HR 57 | Ht 60.0 in | Wt 144.8 lb

## 2024-07-09 DIAGNOSIS — N6489 Other specified disorders of breast: Secondary | ICD-10-CM | POA: Diagnosis not present

## 2024-07-09 MED ORDER — ONDANSETRON HCL 4 MG PO TABS: 4.0000 mg | ORAL_TABLET | Freq: Three times a day (TID) | ORAL | 0 refills | Status: AC | PRN

## 2024-07-09 MED ORDER — OXYCODONE HCL 5 MG PO TABS: 5.0000 mg | ORAL_TABLET | Freq: Four times a day (QID) | ORAL | 0 refills | Status: AC | PRN

## 2024-07-09 MED ORDER — CEPHALEXIN 500 MG PO CAPS: 500.0000 mg | ORAL_CAPSULE | Freq: Four times a day (QID) | ORAL | 0 refills | Status: AC

## 2024-07-09 NOTE — Progress Notes (Cosign Needed)
 "    Patient ID: Katie  JINNY Buckley, female    DOB: June 18, 1950, 74 y.o.   MRN: [id number]  Chief Complaint  Patient presents with   Pre-op Exam      ICD-10-CM   1. Postoperative breast asymmetry  N64.89        History of Present Illness: Katie  KYRIE Buckley is a 74 y.o.  female  with a history of breast implants status post removal.  She presents for preoperative evaluation for upcoming procedure, fat grafting to the right breast for symmetry, scheduled for 07/30/2024 with Dr. Lowery.  The patient has not had problems with anesthesia.  Patient reports that it is been a year since her mammogram.  She states that she is due for a mammogram.  I discussed with the patient that she will need a mammogram prior to surgery in order to proceed.  Recommended that she get that scheduled today.  She states that she will go ahead and get that scheduled.  I discussed with her that if she needs an order for mammogram she can let me know and I can place an order.  She expressed understanding.  Patient denies any personal history of breast cancer.  She does report her daughter had breast cancer.  Patient reports that about 3 weeks ago, she had an echo and a CT scan done with cardiology.  She states that they are going to watch her valves and they are going to have her follow-up in 1 year.  She states she is currently taking Eliquis  and aspirin .  Patient reports she is not a smoker.  Patient denies taking any birth control or hormone replacement.  She denies any history of greater than 3 miscarriages.  She denies any personal family history of blood clots or clotting diseases.  She denies any recent surgeries, traumas or infections.  She denies any history of stroke or heart attack.  She denies any history of Crohn's disease ulcerative colitis, COPD or asthma.  She denies any history of cancer other than skin cancer which was excised.  She denies any recent fevers, chills or changes in her health.  Patient's most  recent A1c in the chart is 6.5 from September.  She reports that she had an A1c done this morning which was 6.9.  We discussed the importance of controlling her sugars in terms of wound healing.  Patient expressed understanding.  Patient also states that she had a shoulder surgery back in October.  She reports that everything has been going okay since then.  She states that she was prescribed some pain medication for her shoulder, but she is not taking the pain medication anymore.  Summary of Previous Visit: Patient was then seen by Dr. Lowery on 07/31/2023. Patient at this visit was overall doing well, but she had a little bit of asymmetry with loss of volume on the right side. Patient reported she was happy with the left side. Patient wanted to see if she could get some fat grafting to the right breast for improved symmetry.  Patient was then supposed to have surgery, but her A1c was elevated at 10.7.  Job: Does not work at this time  PMH Significant for: Heart failure, hypertension, left ventricular hypertrophy, atrial fibrillation, PVD, aortic valve stenosis, asthma, GERD, type 2 diabetes, anxiety  Blood pressure is elevated today.  Patient states that she has not taken her blood pressure medications yet.  She also states that she was stressed driving in.  She denies any headaches,  changes in her vision, chest pain or shortness of breath. Discussed with her that she should reach out to her primary care provider and let them know about her elevated blood pressure today.  She expressed understanding.  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    Cerebral microvascular disease    Coronary artery disease    a.) 1v CABG 09/19/2021: LIMA-LAD   DDD (degenerative disc disease), cervical    a.) s/p ACDF C3-C4 11/15/2020   DDD (degenerative disc disease), lumbar    Depression    Diabetic neuropathy (HCC)    Diabetic retinopathy (HCC)    DOE (dyspnea on exertion)    Frequent sinus infections    GERD (gastroesophageal reflux disease)    Heart murmur    History of Barrett's esophagus    History of colon polyps    Hypertension    Insomnia    a.) takes melatonin + trazodone  PRN   LBBB (left bundle branch block)    MCI (mild cognitive impairment)    MRSA (methicillin resistant staph aureus) culture positive 2018   PAF (paroxysmal atrial fibrillation) (HCC)    a.) CHA2DS2-VASc = 6 (age, sex, HFpEF, HTN, vascuar disease, T2DM) as of 03/24/2024; b.) rate/rhythm maintained on oral metoprolol  tartrate; chronically anticoagulated using apixaban    Palpitations    Pneumonia    Pulmonary hypertension (HCC)    Seasonal allergies    Squamous cell skin cancer    Status post bilateral cataract extraction    T2DM (type 2 diabetes mellitus) (HCC)    Tension headache     Past Surgical History: Past Surgical History:  Procedure Laterality Date   ANTERIOR CERVICAL DECOMP/DISCECTOMY FUSION N/A 11/15/2020   Procedure: ANTERIOR CERVICAL DECOMPRESSION/DISCECTOMY FUSION 1 LEVEL C3/4;  Surgeon: Bluford Standing, MD;  Location: [facility] ORS;  Service: Neurosurgery;  Laterality: N/A;   AORTIC VALVE REPLACEMENT N/A 09/19/2021   Procedure: AORTIC VALVE REPLACEMENT Using 19mm Edwards Resilia Inspiris Valve;  Surgeon: Lucas Dorise POUR, MD;  Location: [facility];  Service: Open Heart Surgery;  Laterality: N/A;   AUGMENTATION MAMMAPLASTY Bilateral    breast implants 1980s   BICEPT TENODESIS Left 03/25/2024   Procedure: TENODESIS, BICEPS;   Surgeon: [name] Priest, MD;  Location: [facility] ORS;  Service: Orthopedics;  Laterality: Left;   BIOPSY  12/18/2022   Procedure: BIOPSY;  Surgeon: Therisa Bi, MD;  Location: [facility] ENDOSCOPY;  Service: Gastroenterology;;   Bone Spur  2007   foot   BREAST IMPLANT REMOVAL Bilateral 10/30/2022   Procedure: Bilateral removal of breast implants with mastopexy;  Surgeon: Lowery Estefana RAMAN, DO;  Location: [facility] OR;  Service: Plastics;  Laterality: Bilateral;   CARDIAC VALVE REPLACEMENT  2023   Carpal Tunnel Symdrome  2008   as repeated in 2009   CATARACT EXTRACTION W/PHACO Left 01/16/2019   Procedure: CATARACT EXTRACTION PHACO AND INTRAOCULAR LENS PLACEMENT (IOC) LEFT DIABETES VISION BLUE;  Surgeon: Ferol Rogue, MD;  Location: [facility];  Service: Ophthalmology;  Laterality: Left;  US   00:47 CDE 6.62 Fluid pack lot #  [id number]   CATARACT EXTRACTION W/PHACO Right 02/06/2019   Procedure: CATARACT EXTRACTION PHACO AND INTRAOCULAR LENS PLACEMENT (IOC);  Surgeon: Ferol Rogue, MD;  Location: [facility] ORS;  Service: Ophthalmology;  Laterality: Right;  US  00:54.6 CDE 6.52 FLUID PACK LOT # [id number]   CHOLECYSTECTOMY  2007   COLONOSCOPY WITH PROPOFOL  N/A 11/17/2015   Procedure: COLONOSCOPY WITH PROPOFOL ;  Surgeon: Lamar ONEIDA Holmes, MD;  Location: [facility] ENDOSCOPY;  Service: Endoscopy;  Laterality: N/A;   COLONOSCOPY WITH PROPOFOL  N/A 06/21/2022   Procedure: COLONOSCOPY WITH PROPOFOL ;  Surgeon: Therisa Bi, MD;  Location: [facility]COPY;  Service: Gastroenterology;  Laterality: N/A;   CORONARY ARTERY BYPASS GRAFT N/A 09/19/2021   Procedure: CORONARY ARTERY BYPASS GRAFTING X1 Using Left Internal Mammory artery;  Surgeon: Lucas Dorise POUR, MD;  Location: [facility];  Service: Open Heart Surgery;  Laterality: N/A;   CORONARY PRESSURE/FFR STUDY N/A 05/24/2021   Procedure: INTRAVASCULAR PRESSURE WIRE/FFR STUDY;  Surgeon: Florencio Cara BIRCH, MD;  Location: [facility] INVASIVE CV LAB;  Service: Cardiovascular;  Laterality: N/A;    COSMETIC SURGERY     ESOPHAGOGASTRODUODENOSCOPY (EGD) WITH PROPOFOL  N/A 11/17/2015   Procedure: ESOPHAGOGASTRODUODENOSCOPY (EGD) WITH PROPOFOL ;  Surgeon: Lamar ONEIDA Holmes, MD;  Location: [facility] ENDOSCOPY;  Service: Endoscopy;  Laterality: N/A;   ESOPHAGOGASTRODUODENOSCOPY (EGD) WITH PROPOFOL  N/A 12/18/2022   Procedure: ESOPHAGOGASTRODUODENOSCOPY (EGD) WITH PROPOFOL ;  Surgeon: Therisa Bi, MD;  Location: [facility] ENDOSCOPY;  Service: Gastroenterology;  Laterality: N/A;   MANDIBLE SURGERY     MASTOPEXY Bilateral 10/30/2022   Procedure: MASTOPEXY;  Surgeon: Lowery Estefana RAMAN, DO;  Location: [facility] OR;  Service: Plastics;  Laterality: Bilateral;   NASAL SINUS SURGERY  03/2013   Multile surgery Dr. Gretta. 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 Claudene   REVERSE SHOULDER ARTHROPLASTY Left 03/25/2024   Procedure: ARTHROPLASTY, SHOULDER, TOTAL, REVERSE;  Surgeon: [name] Priest, MD;  Location: [facility] ORS;  Service: Orthopedics;  Laterality: Left;   RIGHT/LEFT HEART CATH AND CORONARY ANGIOGRAPHY N/A 05/24/2021   Procedure: RIGHT/LEFT HEART CATH AND CORONARY ANGIOGRAPHY;  Surgeon: Florencio Cara BIRCH, MD;  Location: [facility] INVASIVE CV LAB;  Service: Cardiovascular;  Laterality: N/A;   TEE WITHOUT CARDIOVERSION N/A 09/19/2021   Procedure: TRANSESOPHAGEAL ECHOCARDIOGRAM (TEE);  Surgeon: Lucas Dorise POUR, MD;  Location: [facility] OR;  Service: Open Heart Surgery;  Laterality: N/A;   TONSILLECTOMY  1960    Social History: Social History   Socioeconomic History   Marital status: Married    Spouse name: lynwood, husband   Number of children: 1   Years of education: Not on file   Highest education level: Bachelor's degree (e.g., BA, AB, BS)  Occupational History    Comment: retired    Associate Professor: RETIRED  Tobacco Use   Smoking status: Never    Passive exposure: Never   Smokeless tobacco: Never  Vaping Use   Vaping status: Never Used  Substance and Sexual Activity   Alcohol use:  No   Drug use: No   Sexual activity: Not Currently    Partners: Male    Birth control/protection: Post-menopausal  Other Topics Concern   Not on file  Social History Narrative   Not on file   Social Drivers of Health   Tobacco Use: Low Risk (07/09/2024)   Patient History    Smoking Tobacco Use: Never    Smokeless Tobacco Use: Never    Passive Exposure: Never  Financial Resource Strain: Low Risk (05/19/2024)   Overall Financial Resource Strain (CARDIA)  Difficulty of Paying Living Expenses: Not hard at all  Food Insecurity: No Food Insecurity (05/19/2024)   Epic    Worried About Programme Researcher, Broadcasting/film/video in the Last Year: Never true    Ran Out of Food in the Last Year: Never true  Transportation Needs: No Transportation Needs (05/19/2024)   Epic    Lack of Transportation (Medical): No    Lack of Transportation (Non-Medical): No  Physical Activity: Insufficiently Active (05/19/2024)   Exercise Vital Sign    Days of Exercise per Week: 3 days    Minutes of Exercise per Session: 30 min  Stress: No Stress Concern Present (05/19/2024)   [employer] of Occupational Health - Occupational Stress Questionnaire    Feeling of Stress: Only a little  Social Connections: Socially Integrated (05/19/2024)   Social Connection and Isolation Panel    Frequency of Communication with Friends and Family: More than three times a week    Frequency of Social Gatherings with Friends and Family: More than three times a week    Attends Religious Services: More than 4 times per year    Active Member of [employer] or Organizations: Yes    Attends Banker Meetings: More than 4 times per year    Marital Status: Married  Catering Manager Violence: Not At Risk (05/01/2023)   Humiliation, Afraid, Rape, and Kick questionnaire    Fear of Current or Ex-Partner: No    Emotionally Abused: No    Physically Abused: No    Sexually Abused: No  Depression (PHQ2-9): Low Risk (03/03/2024)   Depression (PHQ2-9)     PHQ-2 Score: 0  Alcohol Screen: Low Risk (01/31/2022)   Alcohol Screen    Last Alcohol Screening Score (AUDIT): 0  Housing: Low Risk (05/19/2024)   Epic    Unable to Pay for Housing in the Last Year: No    Number of Times Moved in the Last Year: 0    Homeless in the Last Year: No  Utilities: Not At Risk (05/14/2024)   Received from [facility]   Epic    In the past 12 months has the electric, gas, oil, or water  company threatened to shut off services in your home?: No  Health Literacy: Adequate Health Literacy (05/01/2023)   B1300 Health Literacy    Frequency of need for help with medical instructions: Never    Family History: Family History  Problem Relation Age of Onset   Lung cancer Mother    Alcohol abuse Mother    Cancer Mother    Early death Mother    Hypertension Mother    Diabetes Father    Arthritis Father    Irritable bowel syndrome Sister    Throat cancer Brother    Breast cancer Daughter    Breast cancer Maternal Aunt     Review of Systems: Denies any recent fevers, chills or changes in her health  Physical Exam: Vital Signs BP (!) 180/70 (BP Location: Right Arm, Patient Position: Sitting, Cuff Size: Normal) Comment: Patient has not taking her BP meds.  Pulse (!) 57   Ht 5' (1.524 m)   Wt 144 lb 12.8 oz (65.7 kg)   SpO2 97%   BMI 28.28 kg/m   Physical Exam  Constitutional:      General: Not in acute distress.    Appearance: Normal appearance. Not ill-appearing.  HENT:     Head: Normocephalic and atraumatic.  Eyes:     Pupils: Pupils are equal, round Cardiovascular:  Rate and Rhythm: Normal rate Pulmonary:     Effort: Pulmonary effort is normal. No respiratory distress.  Musculoskeletal: Ambulatory Skin:    Findings: No erythema or rash.  Neurological:     Mental Status: Alert and oriented to person, place, and time. Mental status is at baseline.  Psychiatric:        Mood and Affect: Mood normal.        Behavior: Behavior  normal.    Assessment/Plan: The patient is scheduled for fat grafting to the right breast for symmetry with Dr. Lowery.  Risks, benefits, and alternatives of procedure discussed, questions answered and consent obtained.    Will send clearance to patient's PCP and cardiologist.  We did receive a clearance from her cardiologist from back in May, but patient has had testing send so we will send another one.   Smoking Status: Non-smoker; Counseling Given?  N/A Last Mammogram: Per chart review, most recent mammogram was done on 05/08/2022 and was BI-RADS Category 2 benign.  I discussed with the patient the importance of having her mammogram done prior to surgery.  We discussed the possibility of surgery being canceled if mammogram was not done.  She expressed understanding.  Caprini Score: 7; Risk Factors include: Age, BMI > 25, history of malignancy, and length of planned surgery. Recommendation for mechanical and possible pharmacological prophylaxis. Encourage early ambulation.  Will discuss possibility of postoperative Lovenox  with Dr. Lowery.  Pictures obtained: @consult   Post-op Rx sent to pharmacy: Oxycodone , Zofran , Keflex   Discussed with the patient that we will have to obtain clearance from her cardiologist to hold Eliquis  2 days prior to surgery and aspirin  1 week prior to surgery.  Patient expressed understanding.  Discussed with the patient to hold her Zetia  the day before and the day of surgery.  Discussed with her to hold her glipizide , hydrochlorothiazide , irbesartan , metformin  the day of surgery.  Discussed with her to hold any multivitamins, vitamins or supplements at least 1 week prior to surgery.  Discussed with the patient to not take hydroxyzine  at the same time as pain medications as these can be sedating.  She expressed understanding.  Patient was provided with the General Surgical Risk consent document and Pain Medication Agreement prior to their appointment.  They had  adequate time to read through the risk consent documents and Pain Medication Agreement. We also discussed them in person together during this preop appointment. All of their questions were answered to their satisfaction.  Recommended calling if they have any further questions.  Risk consent form and Pain Medication Agreement to be scanned into patient's chart.  The consent was obtained with risks and complications reviewed which included bleeding, pain, scar, infection and the risk of anesthesia.  The patients questions were answered to the patients expressed satisfaction.   I discussed with the patient that given her history of type 2 diabetes she may be at increased risk of wound healing issues.  I also discussed with the patient given her history of cardiac disease, she may be at increased risk of issues with anesthesia and postoperative complications.  She expressed understanding.  Pictures were obtained of the patient and placed in the chart with the patient's or guardian's permission.  Chaperone was present during pictures.  Discussed with the patient there is the chance that she may have to see Dr. Lowery prior to surgery.  She expressed understanding.   Electronically signed by: Estefana FORBES Peck, PA-C 07/09/2024 5:36 PM       [1]  Allergies Allergen Reactions   Ozempic  (0.25 Or 0.5 Mg-Dose) [Semaglutide (0.25 Or 0.5mg -Dos)] Nausea And Vomiting   Trulicity  [Dulaglutide ] Nausea And Vomiting   Biaxin [Clarithromycin]     GI upset, and bad taste in mouth.   Jardiance  [Empagliflozin ] Other (See Comments)    Abdominal upset   [2]  Current Outpatient Medications:    cephALEXin  (KEFLEX ) 500 MG capsule, Take 1 capsule (500 mg total) by mouth 4 (four) times daily for 3 days., Disp: 12 capsule, Rfl: 0   ondansetron  (ZOFRAN ) 4 MG tablet, Take 1 tablet (4 mg total) by mouth every 8 (eight) hours as needed for up to 15 doses for nausea or vomiting., Disp: 15 tablet, Rfl: 0   oxyCODONE   (ROXICODONE ) 5 MG immediate release tablet, Take 1 tablet (5 mg total) by mouth every 6 (six) hours as needed for up to 10 doses for severe pain (pain score 7-10)., Disp: 10 tablet, Rfl: 0   acetaminophen  (TYLENOL ) 500 MG tablet, Take 2 tablets (1,000 mg total) by mouth every 8 (eight) hours., Disp: 90 tablet, Rfl: 2   amLODipine  (NORVASC ) 5 MG tablet, Take 1 tablet (5 mg total) by mouth daily. (Patient taking differently: Take 5 mg by mouth in the morning and at bedtime.), Disp: 30 tablet, Rfl: 1   amoxicillin -clavulanate (AUGMENTIN ) 875-125 MG tablet, Take 1 tablet by mouth 2 (two) times daily., Disp: 20 tablet, Rfl: 0   apixaban  (ELIQUIS ) 5 MG TABS tablet, Take 1 tablet (5 mg total) by mouth 2 (two) times daily., Disp: 60 tablet, Rfl: 2   Blood Glucose Monitoring Suppl (CONTOUR NEXT MONITOR) w/Device KIT, Use to check blood sugar daily, Disp: 1 kit, Rfl: 0   buPROPion  (WELLBUTRIN  SR) 150 MG 12 hr tablet, Take 150 mg by mouth 2 (two) times daily., Disp: , Rfl:    butalbital -acetaminophen -caffeine  (FIORICET) 50-325-40 MG tablet, TAKE 1 TABLET BY MOUTH EVERY 6 HOURS AS NEEDED FOR HEADACHE, Disp: 30 tablet, Rfl: 4   cholecalciferol (VITAMIN D3) 25 MCG (1000 UNIT) tablet, Take 1,000 Units by mouth daily., Disp: , Rfl:    Continuous Glucose Sensor (FREESTYLE LIBRE 3 PLUS SENSOR) MISC, Use to check blood sugar continuously. Change sensor every 15 days., Disp: 6 each, Rfl: 1   ezetimibe  (ZETIA ) 10 MG tablet, TAKE 1 TABLET BY MOUTH EVERYDAY AT BEDTIME, Disp: 90 tablet, Rfl: 3   FLUoxetine (PROZAC) 40 MG capsule, Take 40 mg by mouth daily., Disp: , Rfl:    glipiZIDE  (GLUCOTROL  XL) 2.5 MG 24 hr tablet, TAKE 1 TABLET BY MOUTH EACH MORNING X1 WEEK THEN MAY INCREASE TO 2 TABS IN THE MORNING AS INSTRUCTED, Disp: 180 tablet, Rfl: 1   hydrochlorothiazide  (HYDRODIURIL ) 12.5 MG tablet, Take 12.5 mg by mouth daily., Disp: , Rfl:    hydrOXYzine  (VISTARIL ) 25 MG capsule, Take 25 mg by mouth daily as needed for anxiety.,  Disp: , Rfl:    insulin  degludec (TRESIBA  FLEXTOUCH) 100 UNIT/ML FlexTouch Pen, INJECT 10 UNITS SUBCUTANEOUSLY AS DIRECTED & INCREASE ONLY AS INSTRUCTED TO MAX DOSE OF 40 UNITS, Disp: 15 mL, Rfl: 1   irbesartan  (AVAPRO ) 150 MG tablet, TAKE 1 TABLET BY MOUTH EVERY DAY, Disp: 90 tablet, Rfl: 1   Lancet Devices MISC, , Disp: , Rfl:    Melatonin 10 MG TABS, Take 10 mg by mouth at bedtime., Disp: , Rfl:    metFORMIN  (GLUCOPHAGE -XR) 500 MG 24 hr tablet, TAKE 2 TABLETS BY MOUTH EVERY DAY, Disp: 180 tablet, Rfl: 3   metoprolol  tartrate (LOPRESSOR ) 50 MG tablet, Take  1 tablet (50 mg total) by mouth 2 (two) times daily., Disp: 60 tablet, Rfl: 2   ondansetron  (ZOFRAN -ODT) 4 MG disintegrating tablet, Take 1 tablet (4 mg total) by mouth every 8 (eight) hours as needed for nausea or vomiting., Disp: 20 tablet, Rfl: 0   ONETOUCH ULTRA test strip, USE TO CHECK BLOOD SUGAR ONCE DAILY, Disp: 100 strip, Rfl: 3   pregabalin  (LYRICA ) 75 MG capsule, Take 1 capsule (75 mg total) by mouth 2 (two) times daily., Disp: , Rfl:    RABEprazole  (ACIPHEX ) 20 MG tablet, Take 1 tablet (20 mg total) by mouth 2 times daily at 12 noon and 4 pm., Disp: 180 tablet, Rfl: 3   rosuvastatin  (CRESTOR ) 20 MG tablet, Take 1 tablet (20 mg total) by mouth at bedtime., Disp: 90 tablet, Rfl: 3   sucralfate  (CARAFATE ) 1 g tablet, TAKE 1 TABLET (1 G TOTAL) BY MOUTH WITH BREAKFAST, WITH LUNCH, AND WITH EVENING MEAL FOR 14 DAYS., Disp: 42 tablet, Rfl: 0   traZODone  (DESYREL ) 50 MG tablet, Take 50-100 mg by mouth at bedtime., Disp: , Rfl:    Vibegron  (GEMTESA ) 75 MG TABS, Take 1 tablet (75 mg total) by mouth daily., Disp: 90 tablet, Rfl: 3  "

## 2024-07-10 ENCOUNTER — Other Ambulatory Visit: Payer: Self-pay | Admitting: Family

## 2024-07-10 ENCOUNTER — Encounter: Payer: Self-pay | Admitting: *Deleted

## 2024-07-10 ENCOUNTER — Ambulatory Visit
Admission: RE | Admit: 2024-07-10 | Discharge: 2024-07-10 | Disposition: A | Discharge status: Home / Self Care | Source: Ambulatory Visit | Attending: Family | Admitting: Family

## 2024-07-10 DIAGNOSIS — Z1231 Encounter for screening mammogram for malignant neoplasm of breast: Secondary | ICD-10-CM | POA: Diagnosis present

## 2024-07-11 ENCOUNTER — Other Ambulatory Visit: Payer: Self-pay | Admitting: *Deleted

## 2024-07-11 DIAGNOSIS — E538 Deficiency of other specified B group vitamins: Secondary | ICD-10-CM

## 2024-07-11 DIAGNOSIS — D509 Iron deficiency anemia, unspecified: Secondary | ICD-10-CM

## 2024-07-15 ENCOUNTER — Inpatient Hospital Stay: Admitting: Nurse Practitioner

## 2024-07-15 ENCOUNTER — Inpatient Hospital Stay: Payer: PPO

## 2024-07-15 ENCOUNTER — Inpatient Hospital Stay: Payer: PPO | Admitting: Internal Medicine

## 2024-07-15 ENCOUNTER — Ambulatory Visit: Payer: Self-pay | Admitting: Family

## 2024-07-16 ENCOUNTER — Ambulatory Visit

## 2024-07-29 ENCOUNTER — Inpatient Hospital Stay

## 2024-07-29 ENCOUNTER — Inpatient Hospital Stay: Admitting: Nurse Practitioner

## 2024-07-30 ENCOUNTER — Ambulatory Visit (HOSPITAL_BASED_OUTPATIENT_CLINIC_OR_DEPARTMENT_OTHER): Admission: RE | Admit: 2024-07-30 | Source: Home / Self Care | Admitting: Plastic Surgery

## 2024-07-30 ENCOUNTER — Encounter (HOSPITAL_BASED_OUTPATIENT_CLINIC_OR_DEPARTMENT_OTHER): Admission: RE | Payer: Self-pay | Source: Home / Self Care

## 2024-08-06 ENCOUNTER — Encounter: Admitting: Student

## 2024-08-19 ENCOUNTER — Encounter: Admitting: Plastic Surgery

## 2024-09-05 ENCOUNTER — Encounter: Admitting: Plastic Surgery

## 2024-10-02 ENCOUNTER — Ambulatory Visit

## 2025-02-23 ENCOUNTER — Ambulatory Visit: Admitting: Urology
